# Patient Record
Sex: Female | Born: 1970
Health system: Southern US, Community
[De-identification: ages and names within clinical notes are randomized; demographics above are authoritative.]

## PROBLEM LIST (undated history)

## (undated) DIAGNOSIS — R519 Headache, unspecified: Secondary | ICD-10-CM

## (undated) DIAGNOSIS — Z8719 Personal history of other diseases of the digestive system: Secondary | ICD-10-CM

## (undated) DIAGNOSIS — H539 Unspecified visual disturbance: Secondary | ICD-10-CM

## (undated) DIAGNOSIS — F419 Anxiety disorder, unspecified: Secondary | ICD-10-CM

## (undated) DIAGNOSIS — M1990 Unspecified osteoarthritis, unspecified site: Secondary | ICD-10-CM

## (undated) DIAGNOSIS — Z973 Presence of spectacles and contact lenses: Secondary | ICD-10-CM

## (undated) DIAGNOSIS — F329 Major depressive disorder, single episode, unspecified: Secondary | ICD-10-CM

## (undated) DIAGNOSIS — J45909 Unspecified asthma, uncomplicated: Secondary | ICD-10-CM

## (undated) DIAGNOSIS — F32A Depression, unspecified: Secondary | ICD-10-CM

## (undated) DIAGNOSIS — K5900 Constipation, unspecified: Secondary | ICD-10-CM

## (undated) DIAGNOSIS — K219 Gastro-esophageal reflux disease without esophagitis: Secondary | ICD-10-CM

## (undated) DIAGNOSIS — T7840XA Allergy, unspecified, initial encounter: Secondary | ICD-10-CM

## (undated) DIAGNOSIS — R0600 Dyspnea, unspecified: Secondary | ICD-10-CM

## (undated) DIAGNOSIS — G35 Multiple sclerosis: Secondary | ICD-10-CM

## (undated) HISTORY — DX: Unspecified visual disturbance: H53.9

## (undated) HISTORY — PX: WISDOM TOOTH EXTRACTION: SHX21

## (undated) HISTORY — DX: Multiple sclerosis: G35

## (undated) HISTORY — PX: UPPER GI ENDOSCOPY: SHX6162

## (undated) HISTORY — PX: COLONOSCOPY: SHX174

## (undated) HISTORY — PX: OTHER SURGICAL HISTORY: SHX169

## (undated) HISTORY — PX: EXCISION MORTON'S NEUROMA: SHX5013

---
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
## 2005-05-28 DIAGNOSIS — J189 Pneumonia, unspecified organism: Secondary | ICD-10-CM

## 2005-05-28 HISTORY — DX: Pneumonia, unspecified organism: J18.9

## 2016-07-04 ENCOUNTER — Encounter: Payer: Self-pay | Admitting: Neurology

## 2017-02-06 DIAGNOSIS — Z79899 Other long term (current) drug therapy: Secondary | ICD-10-CM | POA: Insufficient documentation

## 2017-03-04 DIAGNOSIS — R627 Adult failure to thrive: Secondary | ICD-10-CM | POA: Insufficient documentation

## 2017-03-04 DIAGNOSIS — G35 Multiple sclerosis: Secondary | ICD-10-CM | POA: Insufficient documentation

## 2017-04-30 DIAGNOSIS — R531 Weakness: Secondary | ICD-10-CM | POA: Diagnosis not present

## 2017-04-30 DIAGNOSIS — G35 Multiple sclerosis: Secondary | ICD-10-CM | POA: Diagnosis not present

## 2017-05-01 DIAGNOSIS — R531 Weakness: Secondary | ICD-10-CM | POA: Diagnosis not present

## 2017-05-01 DIAGNOSIS — G35 Multiple sclerosis: Secondary | ICD-10-CM | POA: Diagnosis not present

## 2017-05-02 DIAGNOSIS — G35 Multiple sclerosis: Secondary | ICD-10-CM | POA: Diagnosis not present

## 2017-05-02 DIAGNOSIS — R531 Weakness: Secondary | ICD-10-CM | POA: Diagnosis not present

## 2017-05-03 DIAGNOSIS — G35 Multiple sclerosis: Secondary | ICD-10-CM | POA: Diagnosis not present

## 2017-05-03 DIAGNOSIS — R531 Weakness: Secondary | ICD-10-CM | POA: Diagnosis not present

## 2017-05-04 DIAGNOSIS — G35 Multiple sclerosis: Secondary | ICD-10-CM | POA: Diagnosis not present

## 2017-05-04 DIAGNOSIS — R252 Cramp and spasm: Secondary | ICD-10-CM | POA: Diagnosis not present

## 2017-05-04 DIAGNOSIS — K219 Gastro-esophageal reflux disease without esophagitis: Secondary | ICD-10-CM | POA: Diagnosis not present

## 2017-05-04 DIAGNOSIS — M6281 Muscle weakness (generalized): Secondary | ICD-10-CM | POA: Diagnosis not present

## 2017-05-07 DIAGNOSIS — M79602 Pain in left arm: Secondary | ICD-10-CM | POA: Diagnosis not present

## 2017-05-07 DIAGNOSIS — G35 Multiple sclerosis: Secondary | ICD-10-CM | POA: Diagnosis not present

## 2017-05-07 DIAGNOSIS — M79605 Pain in left leg: Secondary | ICD-10-CM | POA: Diagnosis not present

## 2017-05-07 DIAGNOSIS — M62838 Other muscle spasm: Secondary | ICD-10-CM | POA: Diagnosis not present

## 2017-05-07 DIAGNOSIS — M545 Low back pain: Secondary | ICD-10-CM | POA: Diagnosis not present

## 2017-05-07 DIAGNOSIS — R269 Unspecified abnormalities of gait and mobility: Secondary | ICD-10-CM | POA: Diagnosis not present

## 2017-05-07 DIAGNOSIS — Z79899 Other long term (current) drug therapy: Secondary | ICD-10-CM | POA: Diagnosis not present

## 2017-05-08 DIAGNOSIS — R531 Weakness: Secondary | ICD-10-CM | POA: Diagnosis not present

## 2017-05-08 DIAGNOSIS — G35 Multiple sclerosis: Secondary | ICD-10-CM | POA: Diagnosis not present

## 2017-05-10 DIAGNOSIS — G35 Multiple sclerosis: Secondary | ICD-10-CM | POA: Diagnosis not present

## 2017-05-10 DIAGNOSIS — R531 Weakness: Secondary | ICD-10-CM | POA: Diagnosis not present

## 2017-05-23 DIAGNOSIS — M5090 Cervical disc disorder, unspecified, unspecified cervical region: Secondary | ICD-10-CM | POA: Diagnosis not present

## 2017-05-23 DIAGNOSIS — M4316 Spondylolisthesis, lumbar region: Secondary | ICD-10-CM | POA: Diagnosis not present

## 2017-05-23 DIAGNOSIS — M62838 Other muscle spasm: Secondary | ICD-10-CM | POA: Diagnosis not present

## 2017-05-23 DIAGNOSIS — G35 Multiple sclerosis: Secondary | ICD-10-CM | POA: Diagnosis not present

## 2017-05-23 DIAGNOSIS — Z720 Tobacco use: Secondary | ICD-10-CM | POA: Diagnosis not present

## 2017-06-04 DIAGNOSIS — N6323 Unspecified lump in the left breast, lower outer quadrant: Secondary | ICD-10-CM | POA: Diagnosis not present

## 2017-06-04 DIAGNOSIS — N6310 Unspecified lump in the right breast, unspecified quadrant: Secondary | ICD-10-CM | POA: Diagnosis not present

## 2017-06-04 DIAGNOSIS — N6324 Unspecified lump in the left breast, lower inner quadrant: Secondary | ICD-10-CM | POA: Diagnosis not present

## 2017-06-04 DIAGNOSIS — R928 Other abnormal and inconclusive findings on diagnostic imaging of breast: Secondary | ICD-10-CM | POA: Diagnosis not present

## 2017-06-05 DIAGNOSIS — G894 Chronic pain syndrome: Secondary | ICD-10-CM | POA: Diagnosis not present

## 2017-06-05 DIAGNOSIS — F329 Major depressive disorder, single episode, unspecified: Secondary | ICD-10-CM | POA: Diagnosis not present

## 2017-06-05 DIAGNOSIS — G35 Multiple sclerosis: Secondary | ICD-10-CM | POA: Diagnosis not present

## 2017-06-05 DIAGNOSIS — M6281 Muscle weakness (generalized): Secondary | ICD-10-CM | POA: Diagnosis not present

## 2017-06-11 DIAGNOSIS — N179 Acute kidney failure, unspecified: Secondary | ICD-10-CM | POA: Diagnosis not present

## 2017-06-24 DIAGNOSIS — N839 Noninflammatory disorder of ovary, fallopian tube and broad ligament, unspecified: Secondary | ICD-10-CM | POA: Diagnosis not present

## 2017-06-24 DIAGNOSIS — N83202 Unspecified ovarian cyst, left side: Secondary | ICD-10-CM | POA: Diagnosis not present

## 2017-06-24 DIAGNOSIS — N83201 Unspecified ovarian cyst, right side: Secondary | ICD-10-CM | POA: Diagnosis not present

## 2017-06-25 DIAGNOSIS — N319 Neuromuscular dysfunction of bladder, unspecified: Secondary | ICD-10-CM | POA: Diagnosis not present

## 2017-06-25 DIAGNOSIS — N83202 Unspecified ovarian cyst, left side: Secondary | ICD-10-CM | POA: Diagnosis not present

## 2017-06-25 DIAGNOSIS — N83201 Unspecified ovarian cyst, right side: Secondary | ICD-10-CM | POA: Diagnosis not present

## 2017-06-25 DIAGNOSIS — N3941 Urge incontinence: Secondary | ICD-10-CM | POA: Diagnosis not present

## 2017-07-08 DIAGNOSIS — H5123 Internuclear ophthalmoplegia, bilateral: Secondary | ICD-10-CM | POA: Diagnosis not present

## 2017-07-08 DIAGNOSIS — G35 Multiple sclerosis: Secondary | ICD-10-CM | POA: Diagnosis not present

## 2017-07-09 DIAGNOSIS — M62838 Other muscle spasm: Secondary | ICD-10-CM | POA: Diagnosis not present

## 2017-07-09 DIAGNOSIS — G35 Multiple sclerosis: Secondary | ICD-10-CM | POA: Diagnosis not present

## 2017-07-11 DIAGNOSIS — G35 Multiple sclerosis: Secondary | ICD-10-CM | POA: Diagnosis not present

## 2017-07-18 DIAGNOSIS — M6281 Muscle weakness (generalized): Secondary | ICD-10-CM | POA: Diagnosis not present

## 2017-07-18 DIAGNOSIS — R109 Unspecified abdominal pain: Secondary | ICD-10-CM | POA: Diagnosis not present

## 2017-07-18 DIAGNOSIS — Z79899 Other long term (current) drug therapy: Secondary | ICD-10-CM | POA: Diagnosis not present

## 2017-07-18 DIAGNOSIS — G35 Multiple sclerosis: Secondary | ICD-10-CM | POA: Diagnosis not present

## 2017-07-24 DIAGNOSIS — G35 Multiple sclerosis: Secondary | ICD-10-CM | POA: Diagnosis not present

## 2017-08-15 ENCOUNTER — Encounter: Payer: Self-pay | Admitting: Neurology

## 2017-08-15 ENCOUNTER — Other Ambulatory Visit: Payer: Self-pay

## 2017-08-15 ENCOUNTER — Ambulatory Visit (INDEPENDENT_AMBULATORY_CARE_PROVIDER_SITE_OTHER): Payer: BLUE CROSS/BLUE SHIELD | Admitting: Neurology

## 2017-08-15 VITALS — BP 132/84 | HR 84 | Resp 18 | Ht 69.0 in | Wt 209.5 lb

## 2017-08-15 DIAGNOSIS — R208 Other disturbances of skin sensation: Secondary | ICD-10-CM | POA: Diagnosis not present

## 2017-08-15 DIAGNOSIS — H532 Diplopia: Secondary | ICD-10-CM | POA: Diagnosis not present

## 2017-08-15 DIAGNOSIS — R269 Unspecified abnormalities of gait and mobility: Secondary | ICD-10-CM | POA: Diagnosis not present

## 2017-08-15 DIAGNOSIS — G9341 Metabolic encephalopathy: Secondary | ICD-10-CM | POA: Insufficient documentation

## 2017-08-15 DIAGNOSIS — F418 Other specified anxiety disorders: Secondary | ICD-10-CM

## 2017-08-15 DIAGNOSIS — M62838 Other muscle spasm: Secondary | ICD-10-CM | POA: Diagnosis not present

## 2017-08-15 DIAGNOSIS — G35 Multiple sclerosis: Secondary | ICD-10-CM | POA: Diagnosis not present

## 2017-08-15 DIAGNOSIS — Z79899 Other long term (current) drug therapy: Secondary | ICD-10-CM | POA: Diagnosis not present

## 2017-08-15 MED ORDER — LAMOTRIGINE 100 MG PO TABS: 100.0000 mg | ORAL_TABLET | Freq: Two times a day (BID) | ORAL | 5 refills | Status: DC

## 2017-08-15 NOTE — Progress Notes (Signed)
[facility]  PATIENT: Sharon Odom DOB: 23-Jun-1970  REFERRING DOCTOR OR PCP:  Sharon Odom SOURCE: Patient, notes imaging and lab reports, MRI images on CD from neurology,  _________________________________   HISTORICAL during 1 of her exacerbations a couple years ago  CHIEF COMPLAINT:  Chief Complaint  Patient presents with  . Multiple Sclerosis    Here with husband Sharon Odom to transfer care of MS to [facility].  Dx. October 2013.  Presenting sx. were gait disturbance, right sided numbness, noted her signature was sloppy.  MRI and LP done.  Initially on Copaxone--stopped 2-3 mos. later due to relapse.  Next, she was on Tecfidera for less than 6 mos (relapse).  Next, was on Tysabri from 2014 to 2016. She stopped due to positive JCV ab. Has had 2 years of [place].  Sts. in July 2016 she started having more stiffness left leg.  Walks in a cane or walker, w/c   . Gait Disturbance    for longer distances.  She has cd of last MRI with her/fim  Sharon Odom given at [facility] in [place], phone# [phone], fax# [phone].  Last neurologist was Dr. Canary Odom, in [place], phone# [phone], fax# [phone]/fim    HISTORY OF PRESENT ILLNESS: At the pleasure seeing the patient, Sharon Odom, at the [facility] at [facility] neurologic  She was diagnosed in October 2013 due to difficulty with her handwriting and right hand coordination.   A few weeks later she had gait ataxia and numbness and was referred to MS.   She had an MRI of the brain (was in [place] at the time) and then had a LP confirming the diagnosis.  Initially, she was placed on Copaxone but had severe frequent exacerbations and then went to Tecfidera due to breakthrough exacerbations.  She was referred to [facility] for Tysabri (Feb 2014 to November 2016).  She then moved to [facility] and saw Dr. Ilene Odom [facility] [phone]).   She was always JCV Ab  titer positive.    She did well on it but stopped 03/2015 due to safety risk.  She had more spasticity early 2017 that improved with 5 days IV Solumedrol and then started [facility] April 2017.   The infusion went well.    She started having more spasms in her legs later in 2017 and had Botox injections into her feet (some benefit but only for 3-4 weeks).   She had a second course of Botox November 2017 without much benefit.     She had numbness and tingling in her left arm November 2017 and was given an ESI.   Medical MJ did not help.   She had her second course of Lemtrada in April 2018 but the steroid did not help her spasticity as it did the first year.    She saw Dr. Gaynell Face at [facility] a couple times in 2018 and also saw Dr. Aquilla Hacker at [facility].  Currently,   She uses a walker (rarely cane) around her home and is wheeled outside the house.   Earlier this year, she had worsening spasticity with toes very spastic and legs locking up.    The severe leg spasms have improved but she still has spasms in her feet that are severe and painful.   Spasticity is worse on the left (Odom, leg and arm) and only in her Odom on the right.    She takes 20 mg po qid.   Tizanidine did not help much.  She has some painful dysesthesia but gabapentin had not helped.   She continues to have a lot of pain, worse on her left.     Her left arm is also weak and she can't do many self care tasks (dressing, etc).    Due to the pain, she was drinking alcohol (2 - 3 drinks).   Clonazepam was tried but doses were low.   She was on Ampyra in the past 2014-2017 but felt it wasn't working and stopped.   She was on both Ampyra and Wellbutrin for at least one year and there were no seizures.     She has urinary frequency and urgency, helped by Myrbetriq.   Sometimes due to her gait, she cannot get to the bathroom in time.    She had diplopia during 1 of her exacerbations a few years ago.  She continues to have some double  vision though she is doing better now.    She never had optic neuritis.    Wellbutrin helped her mood better initially and she is very frustrated and irritable.   She was diagnosed with PBA in May 2018 and started on Nuedexta.   She did better mood-wise but spasticity was worse and she stopped.       She feels cognitive skills are mildly worse with mild short term memory issues and decreased focus and attention.     I have reviewed the MRI of the brain and cervical spine dated 03/19/2017.  The MRI of the brain shows multiple T2/FLAIR hyperintense foci in the cerebellum, pons, left greater than right thalamus and in the periventricular, juxtacortical and deep white matter of both hemispheres.    The MRI of the cervical spine showed a subtle focus Adjacent to C4-C5.  She has a disc protrusion at [facility].  REVIEW OF SYSTEMS: Constitutional: No fevers, chills, sweats, or change in appetite.  She has fatigue  Eyes: As above Ear, nose and throat: No hearing loss, ear pain, nasal congestion, sore throat Cardiovascular: No chest pain, palpitations Respiratory: No shortness of breath at rest or with exertion.   No wheezes GastrointestinaI: No nausea, vomiting, diarrhea, abdominal pain, fecal incontinence Genitourinary:As above  musculoskeletal: No neck pain, back pain Integumentary: No rash, pruritus, skin lesions Neurological: as above Psychiatric: As above Endocrine: No palpitations, diaphoresis, change in appetite, change in weigh or increased thirst Hematologic/Lymphatic: No anemia, purpura, petechiae. Allergic/Immunologic: No itchy/runny eyes, nasal congestion, recent allergic reactions, rashes  ALLERGIES: Allergies  Allergen Reactions  . Morphine Other (See Comments)    HOME MEDICATIONS:  Current Outpatient Medications:  .  baclofen (LIORESAL) 20 MG tablet, Take by mouth., Disp: , Rfl:  .  mirabegron ER (MYRBETRIQ) 50 MG TB24 tablet, Take by mouth., Disp: , Rfl:  .  ondansetron  (ZOFRAN) 8 MG tablet, as needed With infusions, Disp: , Rfl:  .  acetaminophen (TYLENOL) 650 MG CR tablet, Take by mouth., Disp: , Rfl:  .  buPROPion (WELLBUTRIN XL) 300 MG 24 hr tablet, Take by mouth., Disp: , Rfl:  .  CASCARA SAGRADA PO, Take by mouth., Disp: , Rfl:  .  Cholecalciferol (VITAMIN D3) 2000 units capsule, Take by mouth., Disp: , Rfl:  .  Green Tea, Camillia sinensis, 1000 MG TABS, Green Tea Complex  500 mg daily, Disp: , Rfl:  .  ibuprofen (ADVIL,MOTRIN) 200 MG tablet, daily., Disp: , Rfl:  .  lamoTRIgine (LAMICTAL) 100 MG tablet, Take 1 tablet (100 mg total) by mouth 2 (two) times daily., Disp:  60 tablet, Rfl: 5 .  Mag Aspart-Potassium Aspart (RA POTASSIUM/MAGNESIUM) 250-250 MG CAPS, daily, Disp: , Rfl:  .  Omega-3 Fatty Acids (FISH OIL PO), Take by mouth., Disp: , Rfl:  .  potassium chloride (MICRO-K) 10 MEQ CR capsule, Take by mouth., Disp: , Rfl:  .  ranitidine (ZANTAC) 300 MG capsule, Take by mouth., Disp: , Rfl:  .  TURMERIC PO, turmeric  1000 mg daily, Disp: , Rfl:  .  vitamin B-12 (CYANOCOBALAMIN) 1000 MCG tablet, Take by mouth., Disp: , Rfl:   PAST MEDICAL HISTORY: Past Medical History:  Diagnosis Date  . Multiple sclerosis (HCC)   . Vision abnormalities     PAST SURGICAL HISTORY:   FAMILY HISTORY: Family History  Problem Relation Age of Onset  . Diabetes Mother   . Healthy Brother     SOCIAL HISTORY:  Social History   Socioeconomic History  . Marital status: Married    Spouse name: Not on file  . Number of children: Not on file  . Years of education: Not on file  . Highest education level: Not on file  Occupational History  . Not on file  Social Needs  . Financial resource strain: Not on file  . Food insecurity:    Worry: Not on file    Inability: Not on file  . Transportation needs:    Medical: Not on file    Non-medical: Not on file  Tobacco Use  . Smoking status: Current Every Day Smoker  . Smokeless tobacco: Current User  Substance  and Sexual Activity  . Alcohol use: Yes    Comment: 2-3 times per wk  . Drug use: Never  . Sexual activity: Not on file  Lifestyle  . Physical activity:    Days per week: Not on file    Minutes per session: Not on file  . Stress: Not on file  Relationships  . Social connections:    Talks on phone: Not on file    Gets together: Not on file    Attends religious service: Not on file    Active member of club or organization: Not on file    Attends meetings of clubs or organizations: Not on file    Relationship status: Not on file  . Intimate partner violence:    Fear of current or ex partner: Not on file    Emotionally abused: Not on file    Physically abused: Not on file    Forced sexual activity: Not on file  Other Topics Concern  . Not on file  Social History Narrative  . Not on file     PHYSICAL EXAM  Vitals:   08/15/17 1346  BP: 132/84  Pulse: 84  Resp: 18  Weight: 209 lb 8 oz (95 kg)  Height: 5\' 9"  (1.753 m)    Body mass index is 30.94 kg/m.   General: The patient is well-developed and well-nourished and in no acute distress  Eyes:  Funduscopic exam shows normal optic discs and retinal vessels.  Neck: The neck is supple, no carotid bruits are noted.  The neck is nontender.  Cardiovascular: The heart has a regular rate and rhythm with a normal S1 and S2. There were no murmurs, gallops or rubs. Lungs are clear to auscultation.  Skin: Extremities are without significant edema.  Musculoskeletal:  Back is nontender  Neurologic Exam  Mental status: The patient is alert and oriented x 3 at the time of the examination. The patient has apparent normal recent  and remote memory, with an apparently normal attention span and concentration ability.   Speech is normal.  Cranial nerves: Extraocular movements are full. Pupils are equal, round, and reactive to light and accomodation.  Visual fields are full.  Facial symmetry is present. There is good facial sensation to  soft touch bilaterally.Facial strength is normal.  Trapezius and sternocleidomastoid strength is normal. No dysarthria is noted.  The tongue is midline, and the patient has symmetric elevation of the soft palate. No obvious hearing deficits are noted.  Motor:  Muscle bulk is normal.   Muscle tone is increased in the left more than right legs and the  left arm.  Strength is 4/5 in the left arm in 3/5 in the left leg and 4-/5 the right leg.  Sensory: Normal sensation to touch and vibration in the arms.  She has reduced sensation to vibration in the legs, worse on the left.  Coordination: Cerebellar testing reveals good right finger-nose-finger and reduced left finger-nose-finger.  The right heel to shin is poorand she cannot doing this on the left  Gait and station: She needs support to stand.  Her gait is spastic.  She cannot do a tandem walk.  Romberg is positive.  Reflexes: Deep tendon reflexes are increased in the legs with spread at the knees and clonus at the ankles, nonsustained bilaterally but more on the left.   plantar responses are flexor.  25 Odom timed walk is 26.0 seconds    DIAGNOSTIC DATA (LABS, IMAGING, TESTING) - I reviewed patient records, labs, notes, testing and imaging myself where available.     ASSESSMENT AND PLAN  Multiple sclerosis (HCC)  Gait disturbance  Muscle spasticity  Depression with anxiety  Diplopia  High risk medication use  Dysesthesia   In summary, Mrs. Fifer is a 47 year old woman with a relapsing form of secondary progressive MS who was treated with [place] with the first infusion week April 2017 and the second infusion April 2018.   Her last MRI did show 1 lesion in 2018 that was not present in 2017.  She would like to have a round of Lemtrada.  I discussed that we need tothird check her REMS data before making any decision.  In the short-term, I will have her get a couple days of IV Solu-Medrol to see if symptoms improve.  When she  returns to see me in 2 months we will make a decision about the [place].  She has spasticity worse in the feet.  She received some benefit from Botox and we could consider injections into the Odom and ankle.  I would try to get records from Dr. Drinda Odom [phone] to see how many units and what muscles were injected in the past.   Before we do that, however, I would like her to increase her baclofen to 100 mg and then up to 120 mg if tolerated.  We also spent some time discussing the baclofen pump but there is some concern that it could lead to more weakness and she is not interested at this time.    She has dysesthetic sensations that did not respond to gabapentin.  I will add lamotrigine and titrate up to 100 mg p.o. twice daily and increase this further if there is some benefit and it is well-tolerated.  She will return to see me in 2 months or sooner if there are new or worsening neurologic symptoms.  Thank you for asking me to see Mrs. Herold Harms.  Please let me know  if I can be of further assistance with her or other patients in the future.  100-minute face-to-face evaluation with greater than one half the time counseling and coordinating care about her many symptoms related to multiple sclerosis  Sharon A. Epimenio Foot, MD, [facility] 08/15/2017, 9:51 PM Certified in Neurology, Clinical Neurophysiology, Sleep Medicine, Pain Medicine and Neuroimaging  [facility] [address], [address] [place], [place] [address] [phone]  botox in the past was  Dr. Ilene Odom [facility] [phone])

## 2017-08-15 NOTE — Patient Instructions (Signed)
The pharmacy has the prescription for lamotrigine 100 mg tablets. For 5 days, just take one half pill a day. For the next 5 days, take one half pill twice a day. For the next 5 days, take one half pill 3 times a day Then start taking one pill twice a day from this point on.    In the future, we may increase the dose further.  If you get a rash, need to stop the medication and not take it again. 

## 2017-08-19 DIAGNOSIS — G35 Multiple sclerosis: Secondary | ICD-10-CM | POA: Diagnosis not present

## 2017-08-20 DIAGNOSIS — G35 Multiple sclerosis: Secondary | ICD-10-CM | POA: Diagnosis not present

## 2017-08-21 DIAGNOSIS — G35 Multiple sclerosis: Secondary | ICD-10-CM | POA: Diagnosis not present

## 2017-08-26 ENCOUNTER — Encounter: Payer: Self-pay | Admitting: Neurology

## 2017-09-09 DIAGNOSIS — L816 Other disorders of diminished melanin formation: Secondary | ICD-10-CM | POA: Diagnosis not present

## 2017-09-09 DIAGNOSIS — L814 Other melanin hyperpigmentation: Secondary | ICD-10-CM | POA: Diagnosis not present

## 2017-09-09 DIAGNOSIS — L821 Other seborrheic keratosis: Secondary | ICD-10-CM | POA: Diagnosis not present

## 2017-09-09 DIAGNOSIS — D1801 Hemangioma of skin and subcutaneous tissue: Secondary | ICD-10-CM | POA: Diagnosis not present

## 2017-09-12 DIAGNOSIS — F341 Dysthymic disorder: Secondary | ICD-10-CM | POA: Diagnosis not present

## 2017-09-12 DIAGNOSIS — G35 Multiple sclerosis: Secondary | ICD-10-CM | POA: Diagnosis not present

## 2017-09-12 DIAGNOSIS — M5412 Radiculopathy, cervical region: Secondary | ICD-10-CM | POA: Diagnosis not present

## 2017-09-12 DIAGNOSIS — M6281 Muscle weakness (generalized): Secondary | ICD-10-CM | POA: Diagnosis not present

## 2017-09-20 DIAGNOSIS — M545 Low back pain: Secondary | ICD-10-CM | POA: Diagnosis not present

## 2017-09-20 DIAGNOSIS — M542 Cervicalgia: Secondary | ICD-10-CM | POA: Diagnosis not present

## 2017-09-20 DIAGNOSIS — I10 Essential (primary) hypertension: Secondary | ICD-10-CM | POA: Diagnosis not present

## 2017-09-20 DIAGNOSIS — Z6831 Body mass index (BMI) 31.0-31.9, adult: Secondary | ICD-10-CM | POA: Diagnosis not present

## 2017-09-20 DIAGNOSIS — G35 Multiple sclerosis: Secondary | ICD-10-CM | POA: Diagnosis not present

## 2017-09-26 ENCOUNTER — Encounter: Payer: Self-pay | Admitting: Neurology

## 2017-09-26 NOTE — Telephone Encounter (Signed)
[name], I changed apt to injection, patient called me and I made her aware!

## 2017-09-30 ENCOUNTER — Encounter: Payer: Self-pay | Admitting: Neurology

## 2017-10-05 DIAGNOSIS — R252 Cramp and spasm: Secondary | ICD-10-CM | POA: Diagnosis not present

## 2017-10-05 DIAGNOSIS — G35 Multiple sclerosis: Secondary | ICD-10-CM | POA: Diagnosis not present

## 2017-10-05 DIAGNOSIS — N390 Urinary tract infection, site not specified: Secondary | ICD-10-CM | POA: Diagnosis not present

## 2017-10-05 DIAGNOSIS — M6281 Muscle weakness (generalized): Secondary | ICD-10-CM | POA: Diagnosis not present

## 2017-10-15 DIAGNOSIS — M50223 Other cervical disc displacement at C6-C7 level: Secondary | ICD-10-CM | POA: Diagnosis not present

## 2017-10-15 DIAGNOSIS — N921 Excessive and frequent menstruation with irregular cycle: Secondary | ICD-10-CM | POA: Diagnosis not present

## 2017-10-15 DIAGNOSIS — N950 Postmenopausal bleeding: Secondary | ICD-10-CM | POA: Diagnosis not present

## 2017-10-15 DIAGNOSIS — G35 Multiple sclerosis: Secondary | ICD-10-CM | POA: Diagnosis not present

## 2017-10-15 DIAGNOSIS — Z1151 Encounter for screening for human papillomavirus (HPV): Secondary | ICD-10-CM | POA: Diagnosis not present

## 2017-10-15 DIAGNOSIS — Z124 Encounter for screening for malignant neoplasm of cervix: Secondary | ICD-10-CM | POA: Diagnosis not present

## 2017-10-16 ENCOUNTER — Telehealth: Payer: Self-pay | Admitting: Neurology

## 2017-10-16 ENCOUNTER — Encounter: Payer: Self-pay | Admitting: *Deleted

## 2017-10-16 ENCOUNTER — Other Ambulatory Visit: Payer: Self-pay

## 2017-10-16 ENCOUNTER — Encounter: Payer: Self-pay | Admitting: Neurology

## 2017-10-16 ENCOUNTER — Ambulatory Visit (INDEPENDENT_AMBULATORY_CARE_PROVIDER_SITE_OTHER): Payer: BLUE CROSS/BLUE SHIELD | Admitting: Neurology

## 2017-10-16 DIAGNOSIS — R269 Unspecified abnormalities of gait and mobility: Secondary | ICD-10-CM | POA: Diagnosis not present

## 2017-10-16 DIAGNOSIS — G35 Multiple sclerosis: Secondary | ICD-10-CM

## 2017-10-16 DIAGNOSIS — F418 Other specified anxiety disorders: Secondary | ICD-10-CM

## 2017-10-16 DIAGNOSIS — G8220 Paraplegia, unspecified: Secondary | ICD-10-CM | POA: Diagnosis not present

## 2017-10-16 DIAGNOSIS — Z79899 Other long term (current) drug therapy: Secondary | ICD-10-CM | POA: Diagnosis not present

## 2017-10-16 DIAGNOSIS — N921 Excessive and frequent menstruation with irregular cycle: Secondary | ICD-10-CM | POA: Diagnosis not present

## 2017-10-16 DIAGNOSIS — M62838 Other muscle spasm: Secondary | ICD-10-CM | POA: Diagnosis not present

## 2017-10-16 MED ORDER — NITROFURANTOIN MACROCRYSTAL 100 MG PO CAPS: 100.0000 mg | ORAL_CAPSULE | Freq: Two times a day (BID) | ORAL | 0 refills | Status: DC

## 2017-10-16 MED ORDER — DALFAMPRIDINE ER 10 MG PO TB12: 10.0000 mg | ORAL_TABLET | Freq: Two times a day (BID) | ORAL | 11 refills | Status: DC

## 2017-10-16 MED ORDER — BACLOFEN 20 MG PO TABS: 20.0000 mg | ORAL_TABLET | Freq: Four times a day (QID) | ORAL | 3 refills | Status: DC

## 2017-10-16 MED ORDER — NITROFURANTOIN MONOHYD MACRO 100 MG PO CAPS: 100.0000 mg | ORAL_CAPSULE | Freq: Two times a day (BID) | ORAL | 0 refills | Status: DC

## 2017-10-16 NOTE — Telephone Encounter (Signed)
Spoke with [employer].  She requested a letter stating it is ok for her to have cosmetic Botox and fillers.  Per [facility] this is ok.  Letter completed and mailed to pt's home address/fim

## 2017-10-16 NOTE — Progress Notes (Signed)
[facility]  PATIENT: Sharon Odom DOB: 02-14-1971  REFERRING DOCTOR OR PCP:  Claretta Fraise SOURCE: Patient, notes imaging and lab reports, MRI images on CD from neurology,  _________________________________   HISTORICAL during 1 of her exacerbations a couple years ago  CHIEF COMPLAINT:  Chief Complaint  Patient presents with  . Multiple Sclerosis    2nd yr. Lemtrada infusions were in April 2018.  Here today for Botox for spasticity in legs/feet.  Botox 100u, 2 vials, [facility]  Lot# [id number].  Exp. 01/2020.  NDC 0023-1145-01/fim    HISTORY OF PRESENT ILLNESS: Sharon Odom is a 47 year old woman with multiple sclerosis, left greater than right spasticity and poor gait.  Update 10/16/2017: After the last visit, the baclofen dose was increased but she had trouble tolerating higher doses and did not think that her spasticity improved any.  She continues to report a lot of spasticity in both legs, left greater than right and in the left arm.  She denies spasticity in the right arm.  There continues to phasic spasms superimposed on the tonic spasms predominantly involving both feet and the left leg.  Gait is poor.  Around the home she will use a walker and can get from one room to another.   At the last visit I also started lamotrigine for her dysesthesias but it was poorly tolerated and she stopped.  Previously she had been unable to tolerate oxcarbazepine.  She has not yet restarted the Ampyra.  In the past, she had received Botox injections into muscles of the left lower leg and both feet.  This helped quite a bit with the first series but less so with the second series.  Since she has moved up to this area, she has not had any more Botox injections.  She had her first course of Lemtrada April 2017 and the second course of April 2018.  She has noted some further progression over the last year and we have discussed an additional third course.  She had  shingles earlier this year.  Pain has resolved.  From 08/15/2017:  She was diagnosed in October 2013 due to difficulty with her handwriting and right hand coordination.   A few weeks later she had gait ataxia and numbness and was referred to MS.   She had an MRI of the brain (was in [place] at the time) and then had a LP confirming the diagnosis.  Initially, she was placed on Copaxone but had severe frequent exacerbations and then went to Tecfidera due to breakthrough exacerbations.  She was referred to [facility] for Tysabri (Feb 2014 to November 2016).  She then moved to [facility] and saw Dr. Ilene Qua [facility] [phone]).   She was always JCV Ab titer positive.    She did well on it but stopped 03/2015 due to safety risk.  She had more spasticity early 2017 that improved with 5 days IV Solumedrol and then started [facility] April 2017.   The infusion went well.    She started having more spasms in her legs later in 2017 and had Botox injections into her feet (some benefit but only for 3-4 weeks).   She had a second course of Botox November 2017 without much benefit.     She had numbness and tingling in her left arm November 2017 and was given an ESI.   Medical MJ did not help.   She had her second course of Lemtrada in April 2018 but the steroid did not help her  spasticity as it did the first year.    She saw Dr. Gaynell Face at [facility] a couple times in 2018 and also saw Dr. Aquilla Hacker at [facility].  Currently,   She uses a walker (rarely cane) around her home and is wheeled outside the house.   Earlier this year, she had worsening spasticity with toes very spastic and legs locking up.    The severe leg spasms have improved but she still has spasms in her feet that are severe and painful.   Spasticity is worse on the left (foot, leg and arm) and only in her foot on the right.    She takes 20 mg po qid.   Tizanidine did not help much.   She has some painful dysesthesia but gabapentin had not helped.    She continues to have a lot of pain, worse on her left.     Her left arm is also weak and she can't do many self care tasks (dressing, etc).    Due to the pain, she was drinking alcohol (2 - 3 drinks).   Clonazepam was tried but doses were low.   She was on Ampyra in the past 2014-2017 but felt it wasn't working and stopped.   She was on both Ampyra and Wellbutrin for at least one year and there were no seizures.     She has urinary frequency and urgency, helped by Myrbetriq.   Sometimes due to her gait, she cannot get to the bathroom in time.    She had diplopia during 1 of her exacerbations a few years ago.  She continues to have some double vision though she is doing better now.    She never had optic neuritis.    Wellbutrin helped her mood better initially and she is very frustrated and irritable.   She was diagnosed with PBA in May 2018 and started on Nuedexta.   She did better mood-wise but spasticity was worse and she stopped.       She feels cognitive skills are mildly worse with mild short term memory issues and decreased focus and attention.     I have reviewed the MRI of the brain and cervical spine dated 03/19/2017.  The MRI of the brain shows multiple T2/FLAIR hyperintense foci in the cerebellum, pons, left greater than right thalamus and in the periventricular, juxtacortical and deep white matter of both hemispheres.    The MRI of the cervical spine showed a subtle focus Adjacent to C4-C5.  She has a disc protrusion at [facility].  REVIEW OF SYSTEMS: Constitutional: No fevers, chills, sweats, or change in appetite.  She has fatigue  Eyes: As above Ear, nose and throat: No hearing loss, ear pain, nasal congestion, sore throat Cardiovascular: No chest pain, palpitations Respiratory: No shortness of breath at rest or with exertion.   No wheezes GastrointestinaI: No nausea, vomiting, diarrhea, abdominal pain, fecal incontinence Genitourinary:As above  musculoskeletal: No neck pain, back  pain Integumentary: No rash, pruritus, skin lesions Neurological: as above Psychiatric: As above Endocrine: No palpitations, diaphoresis, change in appetite, change in weigh or increased thirst Hematologic/Lymphatic: No anemia, purpura, petechiae. Allergic/Immunologic: No itchy/runny eyes, nasal congestion, recent allergic reactions, rashes  ALLERGIES: Allergies  Allergen Reactions  . Morphine Other (See Comments)    HOME MEDICATIONS:  Current Outpatient Medications:  .  acetaminophen (TYLENOL) 650 MG CR tablet, Take by mouth., Disp: , Rfl:  .  baclofen (LIORESAL) 20 MG tablet, Take 1 tablet (20 mg total) by mouth 4 (  four) times daily., Disp: 360 each, Rfl: 3 .  buPROPion (WELLBUTRIN XL) 300 MG 24 hr tablet, Take by mouth., Disp: , Rfl:  .  CASCARA SAGRADA PO, Take by mouth., Disp: , Rfl:  .  Cholecalciferol (VITAMIN D3) 2000 units capsule, Take by mouth., Disp: , Rfl:  .  Green Tea, Camillia sinensis, 1000 MG TABS, Green Tea Complex  500 mg daily, Disp: , Rfl:  .  ibuprofen (ADVIL,MOTRIN) 200 MG tablet, daily., Disp: , Rfl:  .  Mag Aspart-Potassium Aspart (RA POTASSIUM/MAGNESIUM) 250-250 MG CAPS, daily, Disp: , Rfl:  .  Omega-3 Fatty Acids (FISH OIL PO), Take by mouth., Disp: , Rfl:  .  ondansetron (ZOFRAN) 8 MG tablet, as needed With infusions, Disp: , Rfl:  .  potassium chloride (MICRO-K) 10 MEQ CR capsule, Take by mouth., Disp: , Rfl:  .  ranitidine (ZANTAC) 300 MG capsule, Take by mouth., Disp: , Rfl:  .  TURMERIC PO, turmeric  1000 mg daily, Disp: , Rfl:  .  vitamin B-12 (CYANOCOBALAMIN) 1000 MCG tablet, Take by mouth., Disp: , Rfl:  .  B Complex Vitamins (B COMPLEX 1 PO), B Complex  150 mg daily, Disp: , Rfl:  .  dalfampridine 10 MG TB12, Take 1 tablet (10 mg total) by mouth 2 (two) times daily., Disp: 60 tablet, Rfl: 11 .  lamoTRIgine (LAMICTAL) 100 MG tablet, Take 1 tablet (100 mg total) by mouth 2 (two) times daily. (Patient not taking: Reported on 10/16/2017), Disp: 60  tablet, Rfl: 5 .  mirabegron ER (MYRBETRIQ) 50 MG TB24 tablet, Take by mouth., Disp: , Rfl:  .  nitrofurantoin (MACRODANTIN) 100 MG capsule, Take 1 capsule (100 mg total) by mouth 2 (two) times daily., Disp: 14 capsule, Rfl: 0 .  nitrofurantoin, macrocrystal-monohydrate, (MACROBID) 100 MG capsule, Take 1 capsule (100 mg total) by mouth 2 (two) times daily., Disp: 14 capsule, Rfl: 0  PAST MEDICAL HISTORY: Past Medical History:  Diagnosis Date  . Multiple sclerosis (HCC)   . Vision abnormalities     PAST SURGICAL HISTORY:   FAMILY HISTORY: Family History  Problem Relation Age of Onset  . Diabetes Mother   . Healthy Brother     SOCIAL HISTORY:  Social History   Socioeconomic History  . Marital status: Married    Spouse name: Not on file  . Number of children: Not on file  . Years of education: Not on file  . Highest education level: Not on file  Occupational History  . Not on file  Social Needs  . Financial resource strain: Not on file  . Food insecurity:    Worry: Not on file    Inability: Not on file  . Transportation needs:    Medical: Not on file    Non-medical: Not on file  Tobacco Use  . Smoking status: Current Every Day Smoker  . Smokeless tobacco: Current User  Substance and Sexual Activity  . Alcohol use: Yes    Comment: 2-3 times per wk  . Drug use: Never  . Sexual activity: Not on file  Lifestyle  . Physical activity:    Days per week: Not on file    Minutes per session: Not on file  . Stress: Not on file  Relationships  . Social connections:    Talks on phone: Not on file    Gets together: Not on file    Attends religious service: Not on file    Active member of club or organization: Not on file  Attends meetings of clubs or organizations: Not on file    Relationship status: Not on file  . Intimate partner violence:    Fear of current or ex partner: Not on file    Emotionally abused: Not on file    Physically abused: Not on file    Forced  sexual activity: Not on file  Other Topics Concern  . Not on file  Social History Narrative  . Not on file     PHYSICAL EXAM  There were no vitals filed for this visit.  There is no height or weight on file to calculate BMI.   General: The patient is well-developed and well-nourished and in no acute distress  Neurologic Exam  Mental status: The patient is alert and oriented x 3 at the time of the examination. The patient has apparent normal recent and remote memory, with an apparently normal attention span and concentration ability.   Speech is normal.  Cranial nerves: Extraocular movements are full.  Facial strength and sensation is normal.  Trapezius strength is normal.. No dysarthria is noted.  The tongue is midline, and the patient has symmetric elevation of the soft palate. No obvious hearing deficits are noted.  Motor:  Muscle bulk is normal.  She has increased muscle tone in both legs, left greater than right.  There is also mild increased muscle tone in the left arm.  The right arm has normal muscle tone..  Strength is 4/5 in the left arm in 3/5 in the left leg and 4-/5 the right leg.  Sensory: Normal sensation to touch and vibration in the arms.  She has reduced sensation to vibration in the left leg.  Coordination: Cerebellar testing reveals good right finger-nose-finger and reduced left finger-nose-finger.  Right heel-to-shin is poor.  She cannot do heel-to-shin on the left.  Gait and station: She needs support to stand.  Her gait is spastic and needs bilateral support.  She cannot do a tandem walk.  Romberg is positive.  Reflexes: Deep tendon reflexes are increased in the legs with spread at the knees and clonus at the ankles, nonsustained bilaterally but more on the left.   plantar responses are flexor.  25 foot timed walk is 26.0 seconds    DIAGNOSTIC DATA (LABS, IMAGING, TESTING) - I reviewed patient records, labs, notes, testing and imaging myself where  available.     ASSESSMENT AND PLAN  Multiple sclerosis (HCC)  High risk medication use  Muscle spasticity  Gait disturbance  Depression with anxiety  Spastic diplegia, acquired, lower extremity (HCC)   1.   Botox injections as follows:  Left medial gastrocnemius - 40 U Left flexor hallucis longus - 20 U Left flexor dig longus - 20 U Left flexor dig brevis - 20 U Left Adductor Hallucis - 10 U Left Add Dig Quinti - 10 U  Right flexor hallucis longus - 20 U Right flexor dig longus - 20 U Right flexor dig brevis - 20 U Right Adductor Hallucis - 10 U Right Add Dig Quinti - 10 U  2.    We discussed an additional year of [place].  We need to get her laboratory reports sent to our site. 3.    Dalfampridine ER 10 mg twice daily. 4.    Return in 3 months or sooner if there are new or worsening neurologic symptoms.  Zalyn Amend A. Epimenio Foot, MD, [facility] 10/16/2017, 5:15 PM Certified in Neurology, Clinical Neurophysiology, Sleep Medicine, Pain Medicine and Neuroimaging  [facility] [address]  [address], [address] [place], [place] [address] [phone]  botox in the past was  Dr. Ilene Qua [facility] [phone])

## 2017-10-16 NOTE — Telephone Encounter (Signed)
Please call pt at [phone]. Pt states Dr. Epimenio Foot spoke to her about giving her a form approving her to be able to get BOTOX under the eyes. Pt. states she left after her appt and Dr. Epimenio Foot forgot to give her the form. Thank you.

## 2017-10-16 NOTE — Telephone Encounter (Signed)
Per RAS, Macrodantin 100mg  bid #14 with 0 refills escribed to [facility] for pt's uti, as discussed at ov today.  Pt. aware/fim

## 2017-10-16 NOTE — Addendum Note (Signed)
Addended by: Candis Schatz I on: 10/16/2017 04:09 PM   Modules accepted: Orders

## 2017-10-24 DIAGNOSIS — Z6831 Body mass index (BMI) 31.0-31.9, adult: Secondary | ICD-10-CM | POA: Diagnosis not present

## 2017-10-24 DIAGNOSIS — M545 Low back pain: Secondary | ICD-10-CM | POA: Diagnosis not present

## 2017-10-24 DIAGNOSIS — G35 Multiple sclerosis: Secondary | ICD-10-CM | POA: Diagnosis not present

## 2017-10-25 ENCOUNTER — Telehealth: Payer: Self-pay | Admitting: Neurology

## 2017-10-25 NOTE — Telephone Encounter (Signed)
Scheduled pt an appt for July. Pt stating she had been told she would need a f/u, advised that the instructions from last office visit said to returning in 2 years. Please contact if office visit isnt required.

## 2017-10-30 ENCOUNTER — Encounter: Payer: Self-pay | Admitting: Neurology

## 2017-10-31 ENCOUNTER — Telehealth: Payer: Self-pay | Admitting: Neurology

## 2017-10-31 NOTE — Telephone Encounter (Addendum)
I called the patient to schedule her apt but she did not answer. I left a VM asking her to call me back.

## 2017-11-01 NOTE — Telephone Encounter (Signed)
Patient returned [facility] called. She is scheduled for 01/23/18 for her Botox.

## 2017-11-18 DIAGNOSIS — N950 Postmenopausal bleeding: Secondary | ICD-10-CM | POA: Diagnosis not present

## 2017-11-18 DIAGNOSIS — N888 Other specified noninflammatory disorders of cervix uteri: Secondary | ICD-10-CM | POA: Diagnosis not present

## 2017-11-19 ENCOUNTER — Encounter: Payer: Self-pay | Admitting: Neurology

## 2017-11-19 MED ORDER — DALFAMPRIDINE ER 10 MG PO TB12: 10.0000 mg | ORAL_TABLET | Freq: Two times a day (BID) | ORAL | 3 refills | Status: DC

## 2017-12-03 DIAGNOSIS — N950 Postmenopausal bleeding: Secondary | ICD-10-CM | POA: Diagnosis not present

## 2017-12-03 DIAGNOSIS — M6281 Muscle weakness (generalized): Secondary | ICD-10-CM | POA: Diagnosis not present

## 2017-12-03 DIAGNOSIS — N3281 Overactive bladder: Secondary | ICD-10-CM | POA: Diagnosis not present

## 2017-12-03 DIAGNOSIS — G35 Multiple sclerosis: Secondary | ICD-10-CM | POA: Diagnosis not present

## 2017-12-19 ENCOUNTER — Ambulatory Visit (INDEPENDENT_AMBULATORY_CARE_PROVIDER_SITE_OTHER): Payer: BLUE CROSS/BLUE SHIELD | Admitting: Neurology

## 2017-12-19 ENCOUNTER — Encounter: Payer: Self-pay | Admitting: Neurology

## 2017-12-19 VITALS — BP 151/93 | HR 110 | Resp 18 | Ht 69.0 in | Wt 209.0 lb

## 2017-12-19 DIAGNOSIS — M62838 Other muscle spasm: Secondary | ICD-10-CM | POA: Diagnosis not present

## 2017-12-19 DIAGNOSIS — R269 Unspecified abnormalities of gait and mobility: Secondary | ICD-10-CM

## 2017-12-19 DIAGNOSIS — G35 Multiple sclerosis: Secondary | ICD-10-CM | POA: Diagnosis not present

## 2017-12-19 DIAGNOSIS — G8220 Paraplegia, unspecified: Secondary | ICD-10-CM | POA: Diagnosis not present

## 2017-12-19 NOTE — Progress Notes (Signed)
[facility]  PATIENT: Sharon Odom DOB: Feb 27, 1971  REFERRING DOCTOR OR PCP:  Claretta Fraise SOURCE: Patient, notes imaging and lab reports, MRI images on CD from neurology,  _________________________________   HISTORICAL during 1 of her exacerbations a couple years ago  CHIEF COMPLAINT:  Chief Complaint  Patient presents with  . Multiple Sclerosis    2nd yr. Lemtrada infusions were done in April 2018.  Sts. Botox in May did not help spasticity. She would like to discuss if she needs a 3rd year of Lemtrada/fim    HISTORY OF PRESENT ILLNESS: Sharon Odom is a 47 year old woman with multiple sclerosis, left greater than right spasticity and poor gait.  Update 12/19/2017: She feels she is continuing to worsen.  Specifically, she notes more left-sided symptoms with weakness and numbness/pain.  At the last visit, Botox injections into the musculature of the foot, ankle and left calf was performed.    She felt she only had about one week of definite benefit with less spasticity but that the symptoms are otherwise similar.  She especially notes that the second toe on the left spasms severely when she bears weight.  Around the house she uses a walker and can also take some steps with a cane.   She got back on Ampyra but is not sure it has helped.   She has left foot drop.  She had previously tried a Chartered loss adjuster with benefit and felt it helped.   She is interested in seeing if that can help her walking.    She reports continued numbness and pain in the left arm now involving the entire hand.  Symptoms first started in 2017 and have gradually worsened the last 6-12 months.    She is noting more weakness in the left hand as well and can no longer open a can/.    The weakness has gradually worsened over the last half year.  The left sided numbness is felt arm, torso and legs.    There are more spasms in her feet.    She had a UTI in May, helped by [place].    She has had more  trouble urinating with lots of urgency and occasional incontinence.  Other times she needs to go but has trouble starting..   She is on Myrbetriq   She was told the MRI 10/15/2017 showed a new lesion and the images show a focus at C3C4 posteriorly to the left.   I reviewed that MRI personally and note that focus at C3-C4.  Additionally, there are foci in the medulla and pons and cerebellum.  They did bring their MRI dated September 14, 2016.  I note that that focus at C3-C4 was present on the April 2018 MRI and actually also appears to be one to the right at C2 in 2018 that does not clearly apparent on the current MRI.     Update 10/16/2017: After the last visit, the baclofen dose was increased but she had trouble tolerating higher doses and did not think that her spasticity improved any.  She continues to report a lot of spasticity in both legs, left greater than right and in the left arm.  She denies spasticity in the right arm.  There continues to phasic spasms superimposed on the tonic spasms predominantly involving both feet and the left leg.  Gait is poor.  Around the home she will use a walker and can get from one room to another.   At the last visit I also  started lamotrigine for her dysesthesias but it was poorly tolerated and she stopped.  Previously she had been unable to tolerate oxcarbazepine.  She has not yet restarted the Ampyra.  In the past, she had received Botox injections into muscles of the left lower leg and both feet.  This helped quite a bit with the first series but less so with the second series.  Since she has moved up to this area, she has not had any more Botox injections.  She had her first course of Lemtrada April 2017 and the second course of April 2018.  She has noted some further progression over the last year and we have discussed an additional third course.  She had shingles earlier this year.  Pain has resolved.  From 08/15/2017:  She was diagnosed in October 2013 due to  difficulty with her handwriting and right hand coordination.   A few weeks later she had gait ataxia and numbness and was referred to MS.   She had an MRI of the brain (was in [place] at the time) and then had a LP confirming the diagnosis.  Initially, she was placed on Copaxone but had severe frequent exacerbations and then went to Tecfidera due to breakthrough exacerbations.  She was referred to [facility] for Tysabri (Feb 2014 to November 2016).  She then moved to [facility] and saw Dr. Ilene [facility] [phone]).   She was always JCV Ab titer positive.    She did well on it but stopped 03/2015 due to safety risk.  She had more spasticity early 2017 that improved with 5 days IV Solumedrol and then started [facility] April 2017.   The infusion went well.    She started having more spasms in her legs later in 2017 and had Botox injections into her feet (some benefit but only for 3-4 weeks).   She had a second course of Botox November 2017 without much benefit.     She had numbness and tingling in her left arm November 2017 and was given an ESI.   Medical MJ did not help.   She had her second course of Lemtrada in April 2018 but the steroid did not help her spasticity as it did the first year.    She saw Dr. Gaynell Face at [facility] a couple times in 2018 and also saw Dr. Aquilla Hacker at [facility].  Currently,   She uses a walker (rarely cane) around her home and is wheeled outside the house.   Earlier this year, she had worsening spasticity with toes very spastic and legs locking up.    The severe leg spasms have improved but she still has spasms in her feet that are severe and painful.   Spasticity is worse on the left (foot, leg and arm) and only in her foot on the right.    She takes 20 mg po qid.   Tizanidine did not help much.   She has some painful dysesthesia but gabapentin had not helped.   She continues to have a lot of pain, worse on her left.     Her left arm is also weak and she can't do many  self care tasks (dressing, etc).    Due to the pain, she was drinking alcohol (2 - 3 drinks).   Clonazepam was tried but doses were low.   She was on Ampyra in the past 2014-2017 but felt it wasn't working and stopped.   She was on both Ampyra and Wellbutrin for at least one  year and there were no seizures.     She has urinary frequency and urgency, helped by Myrbetriq.   Sometimes due to her gait, she cannot get to the bathroom in time.    She had diplopia during 1 of her exacerbations a few years ago.  She continues to have some double vision though she is doing better now.    She never had optic neuritis.    Wellbutrin helped her mood better initially and she is very frustrated and irritable.   She was diagnosed with PBA in May 2018 and started on Nuedexta.   She did better mood-wise but spasticity was worse and she stopped.       She feels cognitive skills are mildly worse with mild short term memory issues and decreased focus and attention.     I have reviewed the MRI of the brain and cervical spine dated 03/19/2017.  The MRI of the brain shows multiple T2/FLAIR hyperintense foci in the cerebellum, pons, left greater than right thalamus and in the periventricular, juxtacortical and deep white matter of both hemispheres.    The MRI of the cervical spine showed a subtle focus Adjacent to C4-C5.  She has a disc protrusion at [facility].  REVIEW OF SYSTEMS: Constitutional: No fevers, chills, sweats, or change in appetite.  She has fatigue  Eyes: As above Ear, nose and throat: No hearing loss, ear pain, nasal congestion, sore throat Cardiovascular: No chest pain, palpitations Respiratory: No shortness of breath at rest or with exertion.   No wheezes GastrointestinaI: No nausea, vomiting, diarrhea, abdominal pain, fecal incontinence Genitourinary:As above  musculoskeletal: No neck pain, back pain Integumentary: No rash, pruritus, skin lesions Neurological: as above Psychiatric: As above Endocrine:  No palpitations, diaphoresis, change in appetite, change in weigh or increased thirst Hematologic/Lymphatic: No anemia, purpura, petechiae. Allergic/Immunologic: No itchy/runny eyes, nasal congestion, recent allergic reactions, rashes  ALLERGIES: Allergies  Allergen Reactions  . Morphine Other (See Comments)    HOME MEDICATIONS:  Current Outpatient Medications:  .  acetaminophen (TYLENOL) 650 MG CR tablet, Take by mouth., Disp: , Rfl:  .  B Complex Vitamins (B COMPLEX 1 PO), B Complex  150 mg daily, Disp: , Rfl:  .  baclofen (LIORESAL) 20 MG tablet, Take 1 tablet (20 mg total) by mouth 4 (four) times daily., Disp: 360 each, Rfl: 3 .  buPROPion (WELLBUTRIN XL) 300 MG 24 hr tablet, Take by mouth., Disp: , Rfl:  .  CASCARA SAGRADA PO, Take by mouth., Disp: , Rfl:  .  Cholecalciferol (VITAMIN D3) 2000 units capsule, Take by mouth., Disp: , Rfl:  .  dalfampridine 10 MG TB12, Take 1 tablet (10 mg total) by mouth 2 (two) times daily., Disp: 180 tablet, Rfl: 3 .  Green Tea, Camillia sinensis, 1000 MG TABS, Green Tea Complex  500 mg daily, Disp: , Rfl:  .  ibuprofen (ADVIL,MOTRIN) 200 MG tablet, daily., Disp: , Rfl:  .  Mag Aspart-Potassium Aspart (RA POTASSIUM/MAGNESIUM) 250-250 MG CAPS, daily, Disp: , Rfl:  .  Omega-3 Fatty Acids (FISH OIL PO), Take by mouth., Disp: , Rfl:  .  ondansetron (ZOFRAN) 8 MG tablet, as needed With infusions, Disp: , Rfl:  .  potassium chloride (MICRO-K) 10 MEQ CR capsule, Take by mouth., Disp: , Rfl:  .  ranitidine (ZANTAC) 300 MG capsule, Take by mouth., Disp: , Rfl:  .  TURMERIC PO, turmeric  1000 mg daily, Disp: , Rfl:  .  vitamin B-12 (CYANOCOBALAMIN) 1000 MCG tablet, Take by  mouth., Disp: , Rfl:  .  mirabegron ER (MYRBETRIQ) 50 MG TB24 tablet, Take by mouth., Disp: , Rfl:   PAST MEDICAL HISTORY: Past Medical History:  Diagnosis Date  . Multiple sclerosis (HCC)   . Vision abnormalities     PAST SURGICAL HISTORY:   FAMILY HISTORY: Family History    Problem Relation Age of Onset  . Diabetes Mother   . Healthy Brother     SOCIAL HISTORY:  Social History   Socioeconomic History  . Marital status: Married    Spouse name: Not on file  . Number of children: Not on file  . Years of education: Not on file  . Highest education level: Not on file  Occupational History  . Not on file  Social Needs  . Financial resource strain: Not on file  . Food insecurity:    Worry: Not on file    Inability: Not on file  . Transportation needs:    Medical: Not on file    Non-medical: Not on file  Tobacco Use  . Smoking status: Current Every Day Smoker  . Smokeless tobacco: Current User  Substance and Sexual Activity  . Alcohol use: Yes    Comment: 2-3 times per wk  . Drug use: Never  . Sexual activity: Not on file  Lifestyle  . Physical activity:    Days per week: Not on file    Minutes per session: Not on file  . Stress: Not on file  Relationships  . Social connections:    Talks on phone: Not on file    Gets together: Not on file    Attends religious service: Not on file    Active member of club or organization: Not on file    Attends meetings of clubs or organizations: Not on file    Relationship status: Not on file  . Intimate partner violence:    Fear of current or ex partner: Not on file    Emotionally abused: Not on file    Physically abused: Not on file    Forced sexual activity: Not on file  Other Topics Concern  . Not on file  Social History Narrative  . Not on file     PHYSICAL EXAM  Vitals:   12/19/17 1529  BP: (!) 151/93  Pulse: (!) 110  Resp: 18  Weight: 209 lb (94.8 kg)  Height: 5\' 9"  (1.753 m)    Body mass index is 30.86 kg/m.   General: The patient is well-developed and well-nourished and in no acute distress  Neurologic Exam  Mental status: The patient is alert and oriented x 3 at the time of the examination. The patient has apparent normal recent and remote memory, with an apparently normal  attention span and concentration ability.   Speech is normal.  Cranial nerves: Extraocular movements are full.  Facial strength and sensation is normal.  Trapezius strength is normal.. No dysarthria is noted.  The tongue is midline, and the patient has symmetric elevation of the soft palate. No obvious hearing deficits are noted.  Motor:  Muscle bulk is normal.  She has increased muscle tone in both legs, left greater than right.  There is also mild increased muscle tone in the left arm.  The right arm has normal muscle tone.  When she stands, she has spasticity noted in the feet, worse on the left.  Strength is 4/5 in the left arm in 3/5 in the left leg and 4-/5 the right leg.  Sensory: Normal  sensation to touch and vibration in the arms.  She has reduced sensation to vibration in the left leg.  Coordination: Cerebellar testing reveals good right finger-nose-finger and reduced left finger-nose-finger.  Right heel-to-shin is poor.  She cannot do heel-to-shin on the left.  Gait and station: She needs support to stand.  She has a spastic gait but can use a cane.  She is unable to tandem walk.  Romberg is positive.e.  Reflexes: Deep tendon reflexes are increased in the legs with spread at the knees and clonus at the ankles, nonsustained bilaterally but more on the left.   plantar responses are flexor.  25 foot timed walk is 26.0 seconds    DIAGNOSTIC DATA (LABS, IMAGING, TESTING) - I reviewed patient records, labs, notes, testing and imaging myself where available.     ASSESSMENT AND PLAN  Multiple sclerosis (HCC) - Plan: Ambulatory referral to Physical Therapy  Spastic diplegia, acquired, lower extremity (HCC) - Plan: Ambulatory referral to Physical Therapy  Muscle spasticity - Plan: Ambulatory referral to Physical Therapy  Gait disturbance - Plan: Ambulatory referral to Physical Therapy   1.   We will do Botox again next mont.  I will increase the dose to 300 units when we inject  next month.  His 2.    We discussed an additional year of Lemtrada or changing to Ocrevus.  We need to get her laboratory reports sent to our site.    3.    Dalfampridine ER 10 mg twice daily did not help her as much as the brand name and we can try to get the brand covered.    4.    We will have her evaluated for a right WalkAide device.   5.    Return in 3 months or sooner if there are new or worsening neurologic symptoms.  55-minute face-to-face evaluation with greater than one half the time counseling and coordinating care regarding her multiple neurologic symptoms related to her MS.  Yehudah Standing A. Epimenio Foot, MD, [facility] 12/19/2017, 6:09 PM Certified in Neurology, Clinical Neurophysiology, Sleep Medicine, Pain Medicine and Neuroimaging  [facility] [address] [place], [place] [address] [phone]

## 2017-12-20 ENCOUNTER — Encounter: Payer: Self-pay | Admitting: *Deleted

## 2017-12-20 ENCOUNTER — Telehealth: Payer: Self-pay | Admitting: Neurology

## 2017-12-20 NOTE — Telephone Encounter (Signed)
No Sharon Odom is aware . Thanks Faith she will process.

## 2017-12-20 NOTE — Telephone Encounter (Signed)
Noted/fim 

## 2017-12-20 NOTE — Telephone Encounter (Signed)
Dr. Epimenio Foot want's to up Botox 100 unit's came and gave verbal .

## 2018-01-06 ENCOUNTER — Encounter: Payer: Self-pay | Admitting: Neurology

## 2018-01-06 DIAGNOSIS — G35 Multiple sclerosis: Secondary | ICD-10-CM | POA: Diagnosis not present

## 2018-01-06 DIAGNOSIS — D3131 Benign neoplasm of right choroid: Secondary | ICD-10-CM | POA: Diagnosis not present

## 2018-01-06 DIAGNOSIS — H5123 Internuclear ophthalmoplegia, bilateral: Secondary | ICD-10-CM | POA: Diagnosis not present

## 2018-01-08 DIAGNOSIS — N3281 Overactive bladder: Secondary | ICD-10-CM | POA: Diagnosis not present

## 2018-01-08 DIAGNOSIS — F419 Anxiety disorder, unspecified: Secondary | ICD-10-CM | POA: Diagnosis not present

## 2018-01-08 DIAGNOSIS — M5412 Radiculopathy, cervical region: Secondary | ICD-10-CM | POA: Diagnosis not present

## 2018-01-08 DIAGNOSIS — M6281 Muscle weakness (generalized): Secondary | ICD-10-CM | POA: Diagnosis not present

## 2018-01-16 ENCOUNTER — Ambulatory Visit: Payer: BLUE CROSS/BLUE SHIELD | Attending: Internal Medicine | Admitting: Physical Therapy

## 2018-01-16 ENCOUNTER — Telehealth: Payer: Self-pay | Admitting: Neurology

## 2018-01-16 ENCOUNTER — Other Ambulatory Visit: Payer: Self-pay

## 2018-01-16 DIAGNOSIS — R2689 Other abnormalities of gait and mobility: Secondary | ICD-10-CM | POA: Insufficient documentation

## 2018-01-16 DIAGNOSIS — G8114 Spastic hemiplegia affecting left nondominant side: Secondary | ICD-10-CM | POA: Diagnosis not present

## 2018-01-16 DIAGNOSIS — M21372 Foot drop, left foot: Secondary | ICD-10-CM | POA: Diagnosis not present

## 2018-01-16 NOTE — Therapy (Signed)
[facility] [address] [place], [place], [address] Phone: [phone]   Fax:  [phone]  Physical Therapy Evaluation  Patient Details  Name: Sharon Odom MRN: [id number] Date of Birth: [birth year] Referring Provider: Asa Lente, MD   Encounter Date: 01/16/2018  PT End of Session - 01/16/18 2010    Visit Number  1    Number of Visits  1   Bioness eval only today   Authorization Type  [employer]BS out of state    PT Start Time  1530    PT Stop Time  1638    PT Time Calculation (min)  68 min    Equipment Utilized During Treatment  Other (comment)   Bioness   Activity Tolerance  Patient tolerated treatment well    Behavior During Therapy  [facility] for tasks assessed/performed       Past Medical History:  Diagnosis Date  . Multiple sclerosis (HCC)   . Vision abnormalities     Past Surgical History:  Procedure Laterality Date  . EXCISION MORTON'S NEUROMA Right     There were no vitals filed for this visit.   Subjective Assessment - 01/16/18 1539    Subjective  Pt diagnosed with MS 6 years with L hemiparesis.  Pt previously lived in [facility] and was relatively stable.  When she moved to [place] she began to have more L sided weakness and L foot drop.  Pt is here to be evaluated for functional electrical stimulation for foot drop.    Patient is accompained by:  Family member    Pertinent History  Has used a walk aide on RLE. MS x 6 years.  Diplopia.  Anxiety and Depression    Limitations  Standing;Walking    How long can you stand comfortably?  5-10 minutes    How long can you walk comfortably?  household distances    Patient Stated Goals  To obtain a functional electrical stimulation unit to assist with foot drop during gait.  Pt is unwilling to wear an AFO    Currently in Pain?  Yes    Pain Location  Arm    Pain Orientation  Left    Pain Descriptors / Indicators  Pins and needles;Tightness    Pain Type  Neuropathic pain         [facility] PT Assessment - 01/16/18 1544      Assessment   Medical Diagnosis  MS - L foot drop    Referring Provider  Asa Lente, MD    Onset Date/Surgical Date  --   diagnosed in 2013   Prior Therapy  in [facility] for [facility]; evaluated for R Walk Aide      Precautions   Precautions  Fall    Precaution Comments  MS x 6 years, diplopia, spasticity, anxiety/depression      Balance Screen   Has the patient fallen in the past 6 months  No    Has the patient had a decrease in activity level because of a fear of falling?   Yes    Is the patient reluctant to leave their home because of a fear of falling?   Yes      Home Environment   Living Environment  Private residence    Living Arrangements  Spouse/significant other    Type of Home  House    Home Access  Stairs to enter    Entrance Stairs-Number of Steps  3-4    Entrance Stairs-Rails  Right;Left  Home Layout  Two level;Able to live on main level with bedroom/bathroom    Home Equipment  Walker - 2 wheels;Cane - single point;Electric scooter;Wheelchair - manual      Prior Function   Level of Independence  Independent with household mobility with device;Requires assistive device for independence   RW for household distances; w/c or scooter for community     Sensation   Light Touch  Impaired Detail    Light Touch Impaired Details  Impaired LLE    Proprioception  Appears Intact      Tone   Assessment Location  Left Lower Extremity      ROM / Strength   AROM / PROM / Strength  Strength      Strength   Overall Strength  Deficits    Overall Strength Comments  RLE: WFL.  LLE: 1/5 hip flexion, knee extension 3/5, knee flexion 3/5, ankle DF 3/5 but delayed      Ambulation/Gait   Ambulation/Gait  Yes    Ambulation/Gait Assistance  4: Min assist    Ambulation/Gait Assistance Details  performed gait first without use of BIONESS with the following impairments below.  With use of Bioness on L lower LE and hamstring mm group pt  demonstrated improved knee control in stance (decreased recurvatum), improved L foot clearance and step length    Ambulation Distance (Feet)  100 Feet    Assistive device  Rolling walker    Gait Pattern  Step-to pattern;Decreased step length - left;Decreased step length - right;Decreased stance time - left;Decreased stride length;Decreased hip/knee flexion - left;Decreased dorsiflexion - left;Left genu recurvatum;Poor foot clearance - left    Ambulation Surface  Level;Indoor    Stairs  Yes    Stairs Assistance  4: Min assist    Stairs Assistance Details (indicate cue type and reason)  assistance required to clear and advance LLE when descending    Stair Management Technique  Two rails;Step to pattern;Forwards    Number of Stairs  4    Height of Stairs  6      LLE Tone   LLE Tone  Modified Ashworth      LLE Tone   Modified Ashworth Scale for Grading Hypertonia LLE  Slight increase in muscle tone, manifested by a catch, followed by minimal resistance throughout the remainder (less than half) of the ROM                Objective measurements completed on examination: See above findings.      [facility] Adult PT Treatment/Exercise - 01/16/18 1544      Modalities   Modalities  Electrical Stimulation      Electrical Stimulation   Electrical Stimulation Location  L anterior tibialis and L hamstring    Electrical Stimulation Action  closed and open chain ankle DF, knee flexion and hip extension    Electrical Stimulation Parameters  Parameters saved in Tablet 1; quick fit electrodes    Electrical Stimulation Goals  Strength;Tone;Pain;Neuromuscular facilitation             PT Education - 01/16/18 2008    Education Details  purpose of Bioness, process of obtaining Bioness, information on [place] [employer] MS Society, pt would be required to return to outpatient therapy for further training with Bioness if she pursues purchase    Person(s) Educated  Patient;Spouse    Methods   Explanation    Comprehension  Verbalized understanding  Plan - 01/16/18 2011    Clinical Impression Statement  Pt is a 47 year old female referred to Neuro OPPT for evaluation of Bioness functional electrical stimulation.  Pt's PMH is significant for the following: multiple sclerosis x 6 years, diplopia, anxiety and depression.  The following deficits were noted during pt's exam:  L hemiplegia, increased spasticity, pain in LLE, L foot drop, impaired balance and impaired gait.  With utilization of the Bioness L300 lower cuff and thigh cuff on the hamstring muscle group, the patient demonstrated improved L knee control in stance phase, improved foot clearance with swing phase and improved ability to increase gait speed. Pt would benefit from daily use of a home Bioness functional electrical stimulation unit in order to maximize functional mobility independence and reduce falls risk.  Pt seen for evaluation of Bioness unit today only; no f/u appointments made as pt wishes to participate in HHPT at this time (due to transportation issues).  Pt is agreeable to return to outpatient PT for training and education with Bioness unit if she is able to obtain a home unit.      History and Personal Factors relevant to plan of care:  MS x 6 years, had good results with use of Walk Aide in [place] for R sided foot drop, diplopia, spasticity, anxiety/depression, refuses to wear AFO    Clinical Presentation  Stable    Clinical Presentation due to:  MS x 6 years, had good results with use of Walk Aide in [place] for R sided foot drop, diplopia, spasticity, anxiety/depression, refuses to wear AFO    Clinical Decision Making  Low    Rehab Potential  Good    PT Frequency  One time visit    PT Duration  Other (comment)   Bioness eval only   PT Treatment/Interventions  Electrical Stimulation    Consulted and Agree with Plan of Care  Patient;Family member/caregiver    Family Member Consulted   Husband - Theodoro Grist       Patient will benefit from skilled therapeutic intervention in order to improve the following deficits and impairments:  Abnormal gait, Decreased balance, Decreased endurance, Decreased mobility, Decreased strength, Difficulty walking, Impaired sensation, Pain  Visit Diagnosis: Spastic hemiplegia of left nondominant side due to noncerebrovascular etiology (HCC)  Foot drop, left  Other abnormalities of gait and mobility     Problem List Patient Active Problem List   Diagnosis Date Noted  . Spastic diplegia, acquired, lower extremity (HCC) 10/16/2017  . Muscle spasticity 08/15/2017  . Gait disturbance 08/15/2017  . Depression with anxiety 08/15/2017  . Diplopia 08/15/2017  . Multiple sclerosis (HCC) 03/04/2017  . High risk medication use 02/06/2017    Dierdre Highman, PT, DPT 01/16/18    8:24 PM    [facility] [address] [place], [place], [address] Phone: [phone]   Fax:  [phone]  Name: Sharon Odom MRN: [id number] Date of Birth: 12/16/1970

## 2018-01-16 NOTE — Telephone Encounter (Signed)
I called in a new rx for the patient to [facility] phone number is [phone].

## 2018-01-21 NOTE — Patient Instructions (Signed)
LMN for Bioness functional electrical stimulation L300 Go system:   January 21, 2018  To Whom It May Concern,  I am writing on behalf of my patient, Sharon Odom, to request authorization for a medically necessary Bioness L300Go Plus System designed to aid patients with foot drop and knee instability. Sharon Odom has a diagnosis of Multiple Sclerosis with subsequent gait dysfunction, and lower extremity weakness with foot drop.     Sharon Odom requires a rolling walker to ensure safety during ambulation very short distances in her home and requires the use of a manual wheelchair for longer community distances.  She lacks dorsiflexion and hip flexion and knee flexion necessary to clear her left foot during swing.  She also experiences knee hyperextension in stance.  Due to the muscle weakness and spasticity caused by MS, Sharon Odom has developed a pathologic and inefficient gait pattern.  She demonstrates significantly slowed gait velocity, decreased left heel strike, left knee hyperextension in stance and left foot drag during swing.  The L300Go plus allows Sharon Odom to ambulate with improved foot clearance, step length and heel strike and marked decrease in knee hyperextension.   This in turn can decrease the risk for falls and allow Sharon Odom to ambulate at an increased gait velocity for longer distances with decreased assistance.    Sharon Odom has used the L300Go plus for her left side in physical therapy with great results; she also previously used functional electrical stimulation on her R leg and demonstrated significant improvements in leg strength and gait. The device fosters improved mobility, safety and overall quality of life.   I urge you to consider the request for coverage as this device can help to prevent further medical costs due to falls and/or deterioration of functional status.                                                  .  Thank you for your time,     Temple Pacini. Ellender Hose, DPT [facility] [address], [address] [place], [place] [address] [phone] (office) [phone] (fax)   The L300 Go System  The L300 Go System Is as an advanced wireless software-driven system designed for individuals with foot drop. following an upper motor neuron injury/disease, who are seeking to improve their ability to walk mere normally.  The L.300 Go uses technology based on functional electrical stimulation (FES) to provide low-level stimulation to the anterior tibialis and peroneal musculature. This stimulation is delivered in a precise sequence to provide dorsiflexion of the ankle to lift the foot and toes during the swing phase of gait, thereby improving an individual's gait. Individuals with impaired gait have less control over their lower extremity muscles and are at an increased risk for falls. The L300 Go is the first functional electrical stimulation (FES) system to offer 3D motion detection of gait events and muscle activation using data from a 3-axis gyroscope and accelerometer. Patient movement is monitored in all three kinematic planes and stimulation is deployed precisely when needed during the gait cycle. An adaptive, learning algorithm accommodates changes in gait dynamics, and a high-speed processor deploys stimulation within 10 milliseconds of detecting a valid gait event. This rapid, reliable response is critical and supports user confidence. Multi-channel stimulation is an additional noteworthy L300 GO feature that allows clinicians to precisely control the amount of dorsiflexion  and inversion/eversion the system provides. The L300 Go only requires set-up and electrode placement one time, after which the patient can easily apply the device. After approximately 2 weeks, users are able to wear the L300 Go throughout the day.   The new L300Go device builds on the NESS L300 and L300 plus systems and received FDA clearance on June 24, 2015. The device may  also facilitate muscle reeducation. prevent disuse atrophy, increase joint range of motion, and increase local blood flow. Clinical evidence suggests that L300 Go users may experience improved symmetry and rhythmicity of gait, increased walking speed, and improved stability.

## 2018-01-23 ENCOUNTER — Ambulatory Visit (INDEPENDENT_AMBULATORY_CARE_PROVIDER_SITE_OTHER): Payer: BLUE CROSS/BLUE SHIELD | Admitting: Neurology

## 2018-01-23 ENCOUNTER — Other Ambulatory Visit: Payer: Self-pay

## 2018-01-23 ENCOUNTER — Encounter: Payer: Self-pay | Admitting: Neurology

## 2018-01-23 ENCOUNTER — Telehealth: Payer: Self-pay | Admitting: Neurology

## 2018-01-23 VITALS — BP 134/83 | HR 80 | Resp 16 | Ht 69.0 in | Wt 209.0 lb

## 2018-01-23 DIAGNOSIS — G35 Multiple sclerosis: Secondary | ICD-10-CM

## 2018-01-23 DIAGNOSIS — Z79899 Other long term (current) drug therapy: Secondary | ICD-10-CM

## 2018-01-23 DIAGNOSIS — M62838 Other muscle spasm: Secondary | ICD-10-CM | POA: Diagnosis not present

## 2018-01-23 DIAGNOSIS — G8220 Paraplegia, unspecified: Secondary | ICD-10-CM

## 2018-01-23 DIAGNOSIS — R269 Unspecified abnormalities of gait and mobility: Secondary | ICD-10-CM

## 2018-01-23 MED ORDER — ACETAMINOPHEN-CODEINE #3 300-30 MG PO TABS: 1.0000 | ORAL_TABLET | Freq: Three times a day (TID) | ORAL | 2 refills | Status: DC | PRN

## 2018-01-23 MED ORDER — BACLOFEN 20 MG PO TABS: ORAL_TABLET | ORAL | 3 refills | Status: DC

## 2018-01-23 NOTE — Telephone Encounter (Signed)
[employer] Auth: [id number] (exp. 01/23/18 to 03/23/18).  I spoke to the patient she stated she will call me back to schedule her MRI's.. She asked me if I could schedule her Botox appt as well and stated I can and we can schedule both when she calls me back.

## 2018-01-23 NOTE — Telephone Encounter (Signed)
3 mo btx 400 units

## 2018-01-23 NOTE — Progress Notes (Signed)
[facility]  PATIENT: Sharon Odom DOB: 13-Mar-1971  REFERRING DOCTOR OR PCP:  Claretta Fraise SOURCE: Patient, notes imaging and lab reports, MRI images on CD from neurology,  _________________________________   HISTORICAL during 1 of her exacerbations a couple years ago  CHIEF COMPLAINT:  Chief Complaint  Patient presents with  . Multiple Sclerosis    Awaiting 3rd yr. of Lemtrada infusions. Here today for botox inj. for left leg spasticity. Botox 100u, 3 vials.  Lot# [id number]. Exp. 05/2020.  NDC 0023-1145-01/fim    HISTORY OF PRESENT ILLNESS: Sharon Odom is a 47 year old woman with multiple sclerosis, left greater than right spasticity and poor gait.  Update 01/23/2018: Her main problem continues to be spasticity in the legs, left greater than right.  Associated with the spasticity she has quite a bit of pain.  She felt she received benefit from the Botox injections into the legs for a few weeks but then the spasticity returned to the same level.  We discussed using a larger dose today and will consider increasing the dose further based on her response.  She is able to tolerate the baclofen better and has been able to increase the dose.  Gait is poor.  Ampyra seems to be helping a little bit.  She had 2 years of Lemtrada therapy, with the last dose about 15 months ago.  We are in the process of trying to get an additional year of [place] approved.    Update 10/16/2017: After the last visit, the baclofen dose was increased but she had trouble tolerating higher doses and did not think that her spasticity improved any.  She continues to report a lot of spasticity in both legs, left greater than right and in the left arm.  She denies spasticity in the right arm.  There continues to phasic spasms superimposed on the tonic spasms predominantly involving both feet and the left leg.  Gait is poor.  Around the home she will use a walker and can get from one room to  another.   At the last visit I also started lamotrigine for her dysesthesias but it was poorly tolerated and she stopped.  Previously she had been unable to tolerate oxcarbazepine.  She has not yet restarted the Ampyra.  In the past, she had received Botox injections into muscles of the left lower leg and both feet.  This helped quite a bit with the first series but less so with the second series.  Since she has moved up to this area, she has not had any more Botox injections.  She had her first course of Lemtrada April 2017 and the second course of April 2018.  She has noted some further progression over the last year and we have discussed an additional third course.  She had shingles earlier this year.  Pain has resolved.  From 08/15/2017:  She was diagnosed in October 2013 due to difficulty with her handwriting and right hand coordination.   A few weeks later she had gait ataxia and numbness and was referred to MS.   She had an MRI of the brain (was in [place] at the time) and then had a LP confirming the diagnosis.  Initially, she was placed on Copaxone but had severe frequent exacerbations and then went to Tecfidera due to breakthrough exacerbations.  She was referred to [facility] for Tysabri (Feb 2014 to November 2016).  She then moved to [facility] and saw Dr. Ilene Qua [facility] [phone]).   She was always JCV  Ab titer positive.    She did well on it but stopped 03/2015 due to safety risk.  She had more spasticity early 2017 that improved with 5 days IV Solumedrol and then started [facility] April 2017.   The infusion went well.    She started having more spasms in her legs later in 2017 and had Botox injections into her feet (some benefit but only for 3-4 weeks).   She had a second course of Botox November 2017 without much benefit.     She had numbness and tingling in her left arm November 2017 and was given an ESI.   Medical MJ did not help.   She had her second course of Lemtrada in April 2018  but the steroid did not help her spasticity as it did the first year.    She saw Dr. Gaynell Face at [facility] a couple times in 2018 and also saw Dr. Aquilla Hacker at [facility].  Currently,   She uses a walker (rarely cane) around her home and is wheeled outside the house.   Earlier this year, she had worsening spasticity with toes very spastic and legs locking up.    The severe leg spasms have improved but she still has spasms in her feet that are severe and painful.   Spasticity is worse on the left (foot, leg and arm) and only in her foot on the right.    She takes 20 mg po qid.   Tizanidine did not help much.   She has some painful dysesthesia but gabapentin had not helped.   She continues to have a lot of pain, worse on her left.     Her left arm is also weak and she can't do many self care tasks (dressing, etc).    Due to the pain, she was drinking alcohol (2 - 3 drinks).   Clonazepam was tried but doses were low.   She was on Ampyra in the past 2014-2017 but felt it wasn't working and stopped.   She was on both Ampyra and Wellbutrin for at least one year and there were no seizures.     She has urinary frequency and urgency, helped by Myrbetriq.   Sometimes due to her gait, she cannot get to the bathroom in time.    She had diplopia during 1 of her exacerbations a few years ago.  She continues to have some double vision though she is doing better now.    She never had optic neuritis.    Wellbutrin helped her mood better initially and she is very frustrated and irritable.   She was diagnosed with PBA in May 2018 and started on Nuedexta.   She did better mood-wise but spasticity was worse and she stopped.       She feels cognitive skills are mildly worse with mild short term memory issues and decreased focus and attention.     I have reviewed the MRI of the brain and cervical spine dated 03/19/2017.  The MRI of the brain shows multiple T2/FLAIR hyperintense foci in the cerebellum, pons, left  greater than right thalamus and in the periventricular, juxtacortical and deep white matter of both hemispheres.    The MRI of the cervical spine showed a subtle focus Adjacent to C4-C5.  She has a disc protrusion at [facility].  REVIEW OF SYSTEMS: Constitutional: No fevers, chills, sweats, or change in appetite.  She reports fatigue. Eyes: As above Ear, nose and throat: No hearing loss, ear pain, nasal congestion, sore  throat Cardiovascular: No chest pain, palpitations Respiratory: No shortness of breath at rest or with exertion.   No wheezes GastrointestinaI: No nausea, vomiting, diarrhea, abdominal pain, fecal incontinence Genitourinary:As above  musculoskeletal:She has pain in her legs, left greater than right due to spasticity.  She notes mild back pain. Integumentary: No rash, pruritus, skin lesions Neurological: as above Psychiatric: As above Endocrine: No palpitations, diaphoresis, change in appetite, change in weigh or increased thirst Hematologic/Lymphatic: No anemia, purpura, petechiae. Allergic/Immunologic: No itchy/runny eyes, nasal congestion, recent allergic reactions, rashes  ALLERGIES: Allergies  Allergen Reactions  . Morphine Other (See Comments)    HOME MEDICATIONS:  Current Outpatient Medications:  .  acetaminophen (TYLENOL) 650 MG CR tablet, Take by mouth., Disp: , Rfl:  .  Alemtuzumab (LEMTRADA) 12 MG/1.2ML SOLN, Inject 12 mg into the vein., Disp: , Rfl:  .  B Complex Vitamins (B COMPLEX 1 PO), B Complex  150 mg daily, Disp: , Rfl:  .  baclofen (LIORESAL) 20 MG tablet, Take up to 6 pills daily for MS spasticity, Disp: 540 each, Rfl: 3 .  buPROPion (WELLBUTRIN XL) 300 MG 24 hr tablet, Take by mouth., Disp: , Rfl:  .  CASCARA SAGRADA PO, Take by mouth., Disp: , Rfl:  .  Cholecalciferol (VITAMIN D-3) 5000 units TABS, Take by mouth., Disp: , Rfl:  .  Cyanocobalamin (VITAMIN B-12) 2500 MCG SUBL, Place under the tongue., Disp: , Rfl:  .  dalfampridine (AMPYRA) 10 MG  TB12, Take 10 mg by mouth 2 (two) times daily., Disp: , Rfl:  .  Green Tea, Camillia sinensis, 1000 MG TABS, Green Tea Complex  500 mg daily, Disp: , Rfl:  .  ibuprofen (ADVIL,MOTRIN) 200 MG tablet, daily., Disp: , Rfl:  .  Mag Aspart-Potassium Aspart (RA POTASSIUM/MAGNESIUM) 250-250 MG CAPS, daily, Disp: , Rfl:  .  Omega-3 Fatty Acids (FISH OIL PO), Take by mouth., Disp: , Rfl:  .  ondansetron (ZOFRAN) 8 MG tablet, as needed With infusions, Disp: , Rfl:  .  potassium chloride (MICRO-K) 10 MEQ CR capsule, Take by mouth., Disp: , Rfl:  .  ranitidine (ZANTAC) 300 MG capsule, Take by mouth., Disp: , Rfl:  .  TURMERIC PO, turmeric  1000 mg daily, Disp: , Rfl:  .  mirabegron ER (MYRBETRIQ) 50 MG TB24 tablet, Take by mouth., Disp: , Rfl:   PAST MEDICAL HISTORY: Past Medical History:  Diagnosis Date  . Multiple sclerosis (HCC)   . Vision abnormalities     PAST SURGICAL HISTORY:   FAMILY HISTORY: Family History  Problem Relation Age of Onset  . Diabetes Mother   . Healthy Brother     SOCIAL HISTORY:  Social History   Socioeconomic History  . Marital status: Married    Spouse name: Not on file  . Number of children: Not on file  . Years of education: Not on file  . Highest education level: Not on file  Occupational History  . Not on file  Social Needs  . Financial resource strain: Not on file  . Food insecurity:    Worry: Not on file    Inability: Not on file  . Transportation needs:    Medical: Not on file    Non-medical: Not on file  Tobacco Use  . Smoking status: Current Every Day Smoker  . Smokeless tobacco: Current User  Substance and Sexual Activity  . Alcohol use: Yes    Comment: 2-3 times per wk  . Drug use: Never  . Sexual activity: Not  on file  Lifestyle  . Physical activity:    Days per week: Not on file    Minutes per session: Not on file  . Stress: Not on file  Relationships  . Social connections:    Talks on phone: Not on file    Gets together: Not  on file    Attends religious service: Not on file    Active member of club or organization: Not on file    Attends meetings of clubs or organizations: Not on file    Relationship status: Not on file  . Intimate partner violence:    Fear of current or ex partner: Not on file    Emotionally abused: Not on file    Physically abused: Not on file    Forced sexual activity: Not on file  Other Topics Concern  . Not on file  Social History Narrative  . Not on file     PHYSICAL EXAM  Vitals:   01/23/18 0817  BP: 134/83  Pulse: 80  Resp: 16  Weight: 209 lb (94.8 kg)  Height: 5\' 9"  (1.753 m)    Body mass index is 30.86 kg/m.   General: The patient is well-developed and well-nourished and in no acute distress  Neurologic Exam  Mental status: The patient is alert and oriented x 3 at the time of the examination. The patient has apparent normal recent and remote memory, with an apparently normal attention span and concentration ability.   Speech is normal.  Cranial nerves: Extraocular movements are full.  Facial strength and sensation is normal.  The tongue is midline, and the patient has symmetric elevation of the soft palate. No obvious hearing deficits are noted.  Motor:  Muscle bulk is normal.  There is increased muscle tone in the legs, left greater than right.  She has moderate increased muscle tone in the left arm.  The right arm has normal muscle tone.  Strength is 4to 4+/5 in the left arm, 3-4-/5 in the left leg and 4- to 4/5 in the right leg   Sensory: Normal sensation to touch and vibration in the arms.  She has reduced sensation to vibration in the left leg.  Coordination: Cerebellar testing reveals good right finger-nose-finger and reduced left finger-nose-finger.  Right heel-to-shin is poor.  She cannot do heel-to-shin on the left.  Gait and station: She needs support to stand.  Her gait is spastic and needs bilateral support.  She cannot do a tandem walk.  She is walking  faster since starting the Ampyra Romberg is positive.  Reflexes: Deep tendon reflexes are increased in the legs with spread at the knees and clonus at the ankles, nonsustained bilaterally but more on the left.   plantar responses are flexor.     DIAGNOSTIC DATA (LABS, IMAGING, TESTING) - I reviewed patient records, labs, notes, testing and imaging myself where available.     ASSESSMENT AND PLAN  Spastic diplegia, acquired, lower extremity (HCC)  Multiple sclerosis (HCC)  High risk medication use  Muscle spasticity  Gait disturbance   1.   Botox 300 Units injections as follows:  Left medial gastrocnemius - 60 U    Left flexor hallucis longus - 30 U Left flexor dig longus - 30 U Left flexor dig brevis - 30 U Left Adductor Hallucis - 20 U Left Add Dig Quinti - 10 U  Right flexor hallucis longus - 30 U Right flexor dig longus - 30 U Right flexor dig brevis - 30 U Right  Adductor Hallucis - 15 U Right Add Dig Quinti - 15 U  2.    We are trying to get the Lemtrada REMS program transfer to our site.  Once we are able to do this we can have her get a third year of [place]. 3.    Continue Dalfampridine ER 10 mg twice daily. 4.    Return in 3 months or sooner if there are new or worsening neurologic symptoms.   Addendum:   I personally reviewed the MRI of the cervical spine from 10/15/2017 and compared it side-by-side to the MRI from 09/14/2016.  The 2019 MRI clearly shows a small T2 hyperintense focus posteriorly to the left adjacent to C3-C4.  Although it is more subtle, it is also noted on the previous MRI.  There also might be a small focus adjacent to C2 to the right in 2018 that is not present on the 2019 MRI. ---RAS   Barb Shear A. Epimenio Foot, MD, [place] 01/23/2018, 9:10 AM Certified in Neurology, Clinical Neurophysiology, Sleep Medicine, Pain Medicine and Neuroimaging  [facility] [address] [place], [place] [address] [phone]

## 2018-01-24 NOTE — Telephone Encounter (Signed)
Pt returning Sharon Odom's call  °

## 2018-01-24 NOTE — Telephone Encounter (Signed)
I spoke with the patient she is scheduled for 02/05/18 at [facility].  Her botox is also scheduled for 04/18/18. Duwayne Heck is that okay?

## 2018-01-28 ENCOUNTER — Telehealth: Payer: Self-pay | Admitting: *Deleted

## 2018-01-28 NOTE — Telephone Encounter (Signed)
Noted, that is ok.

## 2018-01-28 NOTE — Telephone Encounter (Signed)
PA for Dalfampridine 10mg  #60/30 completed and faxed to [facility], fax# [phone]/fim

## 2018-01-31 NOTE — Telephone Encounter (Signed)
Completed.

## 2018-02-02 ENCOUNTER — Telehealth: Payer: Self-pay | Admitting: Physical Therapy

## 2018-02-02 NOTE — Telephone Encounter (Signed)
Hello Dr. Epimenio Foot, Sharon Odom participated in an initial evaluation and trial of Bioness functional e-stim to see if she would benefit from its use.  Following that evaluation [name] did want to pursue getting the Bioness through insurance and through the financial assistance of the MS Society.  I did not certify her for more visits at that time because is often a lengthy process.    She has since decided to go ahead and purchase the Bioness out of pocket and will be receiving the unit this week.  In order to certify her for therapy visits, we will need a new PT/Bioness eval and treat order entered into Epic before she returns this week with her Bioness unit.  Thank you, Dierdre Highman, PT, DPT 02/02/18    9:21 AM

## 2018-02-03 ENCOUNTER — Other Ambulatory Visit: Payer: Self-pay | Admitting: Neurology

## 2018-02-03 DIAGNOSIS — G35 Multiple sclerosis: Secondary | ICD-10-CM

## 2018-02-03 DIAGNOSIS — G8220 Paraplegia, unspecified: Secondary | ICD-10-CM

## 2018-02-05 ENCOUNTER — Ambulatory Visit (INDEPENDENT_AMBULATORY_CARE_PROVIDER_SITE_OTHER): Payer: BLUE CROSS/BLUE SHIELD

## 2018-02-05 DIAGNOSIS — G35 Multiple sclerosis: Secondary | ICD-10-CM | POA: Diagnosis not present

## 2018-02-05 MED ORDER — GADOBENATE DIMEGLUMINE 529 MG/ML IV SOLN
20.0000 mL | Freq: Once | INTRAVENOUS | Status: AC | PRN
  Administered 2018-02-05: 20 mL via INTRAVENOUS

## 2018-02-06 ENCOUNTER — Encounter: Payer: Self-pay | Admitting: Neurology

## 2018-02-07 ENCOUNTER — Telehealth: Payer: Self-pay | Admitting: *Deleted

## 2018-02-07 ENCOUNTER — Ambulatory Visit: Payer: BLUE CROSS/BLUE SHIELD | Attending: Internal Medicine | Admitting: Physical Therapy

## 2018-02-07 DIAGNOSIS — M21372 Foot drop, left foot: Secondary | ICD-10-CM | POA: Insufficient documentation

## 2018-02-07 DIAGNOSIS — G8114 Spastic hemiplegia affecting left nondominant side: Secondary | ICD-10-CM | POA: Diagnosis not present

## 2018-02-07 DIAGNOSIS — R2689 Other abnormalities of gait and mobility: Secondary | ICD-10-CM

## 2018-02-07 NOTE — Telephone Encounter (Signed)
-----   Message from Asa Lente, MD sent at 02/06/2018  7:04 PM EDT ----- Please let her know that the MRI of the brain and spine did not show any new lesions.

## 2018-02-07 NOTE — Telephone Encounter (Signed)
Spoke with Sharon Odom and reviewed below MRI results. She verbalized understanding of same/fim

## 2018-02-07 NOTE — Telephone Encounter (Signed)
-----   Message from Richard A Sater, MD sent at 02/06/2018  7:04 PM EDT ----- Please let her know that the MRI of the brain and spine did not show any new lesions. 

## 2018-02-08 ENCOUNTER — Encounter: Payer: Self-pay | Admitting: Physical Therapy

## 2018-02-08 NOTE — Patient Instructions (Signed)
Weight Shift: Diagonal    Slowly shift weight forward over left leg. Shift backward over right leg with left toes lifted while Bioness is stimulating. Hold each position __5__ seconds. Repeat __10__ times per session. Do __2__ sessions per day.   FUNCTIONAL MOBILITY: Marching - Standing    Stand holding the back of a chair or counter top.  When the Bioness is stimulating lift your left knee up towards the back of the chair or cabinet.   _10__ reps per set, __2_ sets per day

## 2018-02-08 NOTE — Therapy (Signed)
[facility] [address] [place], [place], [address] Phone: [phone]   Fax:  [phone]  Physical Therapy Evaluation and Treatment  Patient Details  Name: Sharon Odom MRN: [id number] Date of Birth: Apr 06, 1971 Referring Provider: Asa Lente, MD   Encounter Date: 02/07/2018  PT End of Session - 02/08/18 1320    Visit Number  1    Number of Visits  7    Date for PT Re-Evaluation  03/25/18    Authorization Type  BCBS out of state    PT Start Time  1530    PT Stop Time  1645    PT Time Calculation (min)  75 min    Equipment Utilized During Treatment  Other (comment)   Bioness   Activity Tolerance  Patient tolerated treatment well    Behavior During Therapy  [facility] for tasks assessed/performed       Past Medical History:  Diagnosis Date  . Multiple sclerosis (HCC)   . Vision abnormalities     Past Surgical History:  Procedure Laterality Date  . EXCISION MORTON'S NEUROMA Right     There were no vitals filed for this visit.   Subjective Assessment - 02/08/18 1259    Subjective  Pt diagnosed with MS 6 years with L hemiparesis.  Pt previously lived in [facility] and was relatively stable.  When she moved to [place] she began to have more L sided weakness and L foot drop.  Pt previously evaluated for use of Bioness functional electrical stimulation.  Pt returns with her purchased, home Bioness unit and is ready to begin using at home.    Patient is accompained by:  Family member    Pertinent History  Has used a walk aide on RLE. MS x 6 years.  Diplopia.  Anxiety and Depression    Limitations  Standing;Walking    How long can you stand comfortably?  5-10 minutes    How long can you walk comfortably?  household distances    Patient Stated Goals  To obtain a functional electrical stimulation unit to assist with foot drop during gait.  Pt is unwilling to wear an AFO    Currently in Pain?  No/denies         [facility] PT  Assessment - 02/07/18 1700      Assessment   Medical Diagnosis  MS - L foot drop    Referring Provider  Asa Lente, MD    Onset Date/Surgical Date  --   diagnosed in 2013   Prior Therapy  in [facility] for MS; evaluated for R Walk Aide      Precautions   Precautions  Fall    Precaution Comments  MS x 6 years, diplopia, spasticity, anxiety/depression      Balance Screen   Has the patient fallen in the past 6 months  No    Has the patient had a decrease in activity level because of a fear of falling?   Yes    Is the patient reluctant to leave their home because of a fear of falling?   Yes      Home Environment   Living Environment  Private residence    Living Arrangements  Spouse/significant other    Type of Home  House    Home Access  Stairs to enter    Entrance Stairs-Number of Steps  3-4    Entrance Stairs-Rails  Right;Left    Home Layout  Two level;Able to live on main  level with bedroom/bathroom    Home Equipment  Walker - 2 wheels;Cane - single point;Electric scooter;Wheelchair - manual      Prior Function   Level of Independence  Independent with household mobility with device;Requires assistive device for independence   RW for household distances; w/c or scooter for community     Sensation   Light Touch  Impaired Detail    Light Touch Impaired Details  Impaired LLE    Proprioception  Appears Intact      Tone   Assessment Location  Left Lower Extremity      ROM / Strength   AROM / PROM / Strength  Strength      Strength   Overall Strength  Deficits    Overall Strength Comments  RLE: WFL.  LLE: 1/5 hip flexion, knee extension 3/5, knee flexion 3/5, ankle DF 3/5 but delayed      Ambulation/Gait   Ambulation/Gait  Yes    Ambulation/Gait Assistance  5: Supervision;4: Min assist    Ambulation/Gait Assistance Details  Performed initial set up of Bioness home unit with therapist making adjustments to gait settings and intensity settings.  Also performed gait short  distances with patient's cane with min A.  Pt cued for increased L hip and knee flexion during swing phase.  Pt noted to have increased foot drag with longer distances and fatigue    Ambulation Distance (Feet)  115 Feet    Assistive device  Rollator    Gait Pattern  Decreased step length - left;Decreased stance time - left;Decreased stride length;Decreased hip/knee flexion - left;Decreased dorsiflexion - left;Left genu recurvatum;Poor foot clearance - left;Step-through pattern    Ambulation Surface  Level;Indoor    Stairs  --    [employer]  --    Stair Management Technique  --    Number of Stairs  --    Height of Stairs  --      LLE Tone   LLE Tone  Modified Ashworth      LLE Tone   Modified Ashworth Scale for Grading Hypertonia LLE  Slight increase in muscle tone, manifested by a catch, followed by minimal resistance throughout the remainder (less than half) of the ROM                Objective measurements completed on examination: See above findings.      [facility] Adult PT Treatment/Exercise - 02/07/18 1700      Therapeutic Activites    Therapeutic Activities  Other Therapeutic Activities    Other Therapeutic Activities  initiated Bioness home education.  Reviewed with patient and husband: set up of each cuff, care of electrodes, changing electrodes every 2 weeks, skin care/checks, donning/doffing, powering on/off and pairing cuffs to phone app, wear schedule, training schedule and initial HEP      Exercises   Exercises  Knee/Hip      Knee/Hip Exercises: Standing   Hip Flexion  Stengthening;Left;1 set;10 reps;Knee bent   5 seconds on, 10 seconds off   Other Standing Knee Exercises  staggered stance with left foot forwards performing ant/post weight shifting ankle DF 5 seconds on, 10 seconds off x 10 reps with bilat UE support      Modalities   Modalities  Electrical Stimulation      Electrical Stimulation   Electrical Stimulation Location  L anterior tibialis  and L hamstring    Electrical Stimulation Action  closed and open chain ankle DF, knee flexion and hip extension    Electrical  Stimulation Parameters  Parameters saved in Tablet 1; quick fit electrodes.  Transitioned over to patient's phone app    Electrical Stimulation Goals  Strength;Tone;Pain;Neuromuscular facilitation             PT Education - 02/08/18 1320    Education Details  See TA; HEP    Person(s) Educated  Patient;Spouse    Methods  Explanation;Demonstration;Handout    Comprehension  Verbalized understanding;Returned demonstration          PT Long Term Goals - 02/08/18 1333      PT LONG TERM GOAL #1   Title  Pt will demonstrate independence with set up and donning of LLE upper and lower Bioness cuffs, pairing to phone and use of app controls    Time  6    Period  Weeks    Status  New    Target Date  03/25/18      PT LONG TERM GOAL #2   Title  Pt will demonstrate independence with skin checks and care of Bioness electrodes (drying, changing every 2 weeks)    Time  6    Period  Weeks    Status  New    Target Date  03/25/18      PT LONG TERM GOAL #3   Title  Pt will demonstrate independence with Bioness training HEP    Time  6    Period  Weeks    Status  New    Target Date  03/25/18      PT LONG TERM GOAL #4   Title  Pt will progress to wearing Bioness 8 hours/day in gait mode with 15 min, 2x/day in training mode    Time  6    Period  Weeks    Status  New    Target Date  03/25/18      PT LONG TERM GOAL #5   Title  Pt will improve functional gait velocity with Bioness and LRAD to >/= 1.8 ft/sec     Baseline  TBD    Time  6    Period  Weeks    Status  New    Target Date  03/25/18      Additional Long Term Goals   Additional Long Term Goals  Yes      PT LONG TERM GOAL #6   Title  Pt will ambulate x 115' over indoor surfaces with LRAD/Bioness and negotiate 4 stairs with 2 rails MOD I    Time  6    Period  Weeks    Status  New    Target Date   03/25/18             Plan - 02/08/18 1322    Clinical Impression Statement  Patient returns with purchased home Bioness unit for initial fitting, set up and training.  Pt's husband present to observe all education.  Initiated wear schedule and training schedule with pt to return next week for further assessment and training.  Pt will benefit from skilled PT services to monitor patient compliance with wear and training schedule and tolerance to home Bioness unit as well as ongoing gait training and functional LLE strengthening and motor planning in order to maximize functional mobility independence and decrease risk for falls.    History and Personal Factors relevant to plan of care:  Just purchased Bioness home unit, MS x 6 years, had good results with use of Walk Aide in [facility] for R foot drop; diplopia, spasticity, anxiety/depression, refuses  to wear AFO    Clinical Presentation  Stable    Clinical Presentation due to:  Just purchased Bioness home unit, MS x 6 years, had good results with use of Walk Aide in [facility] for R foot drop; diplopia, spasticity, anxiety/depression, refuses to wear AFO    Clinical Decision Making  Low    Rehab Potential  Good    PT Frequency  1x / week    PT Duration  6 weeks   Bioness eval only   PT Treatment/Interventions  Electrical Stimulation;ADLs/Self Care Home Management;DME Instruction;Gait training;Stair training;Functional mobility training;Therapeutic activities;Therapeutic exercise;Balance training;Neuromuscular re-education;Patient/family education;Passive range of motion;Energy conservation    PT Next Visit Plan  Assess gait velocity, check wear schedule/skin, carry over of techniques for donning.  Add to HEP, update wear schedule.  Gait and stair train with home Bioness    Consulted and Agree with Plan of Care  Patient;Family member/caregiver    Family Member Consulted  Husband - Theodoro Grist       Patient will benefit from skilled therapeutic intervention in order  to improve the following deficits and impairments:  Abnormal gait, Decreased balance, Decreased endurance, Decreased mobility, Decreased strength, Difficulty walking, Impaired sensation, Pain  Visit Diagnosis: Spastic hemiplegia of left nondominant side due to noncerebrovascular etiology (HCC)  Foot drop, left  Other abnormalities of gait and mobility     Problem List Patient Active Problem List   Diagnosis Date Noted  . Spastic diplegia, acquired, lower extremity (HCC) 10/16/2017  . Muscle spasticity 08/15/2017  . Gait disturbance 08/15/2017  . Depression with anxiety 08/15/2017  . Diplopia 08/15/2017  . Multiple sclerosis (HCC) 03/04/2017  . High risk medication use 02/06/2017    Dierdre Highman, PT, DPT 02/08/18    1:46 PM    [facility] [address] [place], [place], [address] Phone: [phone]   Fax:  [phone]  Name: Sharon Odom MRN: [id number] Date of Birth: 05-Jul-1970

## 2018-02-14 ENCOUNTER — Other Ambulatory Visit: Payer: Self-pay | Admitting: *Deleted

## 2018-02-14 ENCOUNTER — Ambulatory Visit: Payer: BLUE CROSS/BLUE SHIELD | Admitting: Physical Therapy

## 2018-02-14 ENCOUNTER — Encounter: Payer: Self-pay | Admitting: Physical Therapy

## 2018-02-14 DIAGNOSIS — G8114 Spastic hemiplegia affecting left nondominant side: Secondary | ICD-10-CM | POA: Diagnosis not present

## 2018-02-14 DIAGNOSIS — R2689 Other abnormalities of gait and mobility: Secondary | ICD-10-CM | POA: Diagnosis not present

## 2018-02-14 DIAGNOSIS — M21372 Foot drop, left foot: Secondary | ICD-10-CM | POA: Diagnosis not present

## 2018-02-14 MED ORDER — SULFAMETHOXAZOLE-TRIMETHOPRIM 800-160 MG PO TABS: 1.0000 | ORAL_TABLET | Freq: Two times a day (BID) | ORAL | 0 refills | Status: DC

## 2018-02-14 NOTE — Patient Instructions (Signed)
Gastroc, Sitting (Passive) with your leg lifter over your foot    Sit with strap or towel around ball of foot. When the stimulation is on: gently pull toward body. Hold __5_ seconds.  Repeat _6-8__ times per session. Take a break if it spasms.  Do _2__ sessions per day.   Extension: Isometric Bridging (Supine)    Lie with legs bent, feet flat on bed.  When the leg is stimulated, lift toes and press heels down into bed.   Hold _5__ seconds. Relax. Repeat __6-8_ times. Do _2__ sessions per day.   Bracing With Heel Slides  SIT IN A CHAIR WITH YOUR LEFT FOOT ON A SMOOTH FLOOR - SHOE OFF    WHEN THE LEG IS BEING STIMULATED, slide heel BACK.  HOLD 5 SECONDS. KICK IT OUT STRAIGHT WHEN STIMULATION IS OFF. Repeat _6-8__ times. Do _2__ times a day.     Marching    SWITCH THE THIGH CUFF TO THE FRONT OF YOUR THIGH.  WHEN THE LEG IS STIMULATED, LIFT THE LEFT LEG, as if marching.  HOLD 3-4 SECONDS.  Repeat _6-8__ times. Do _2__ sessions per day.    Weight Shift: Diagonal    Slowly shift weight forward over left leg. Shift backward over right leg with left toes lifted while Bioness is stimulating. Hold each position __5__ seconds. Repeat __10__ times per session. Do __2__ sessions per day.

## 2018-02-16 NOTE — Therapy (Signed)
[facility] [address] [place], [place], [address] Phone: [phone]   Fax:  [phone]  Physical Therapy Treatment  Patient Details  Name: Sharon Odom MRN: [id number] Date of Birth: 1971/04/18 Referring Provider: Asa Lente, MD   Encounter Date: 02/14/2018  PT End of Session - 02/16/18 1432    Visit Number  2    Number of Visits  7    Date for PT Re-Evaluation  03/25/18    Authorization Type  BCBS out of state    PT Start Time  1450    PT Stop Time  1543    PT Time Calculation (min)  53 min    Equipment Utilized During Treatment  Other (comment)   Bioness   Activity Tolerance  Patient limited by fatigue;Treatment limited secondary to medical complications (Comment)   bacterial infection   Behavior During Therapy  [facility] for tasks assessed/performed       Past Medical History:  Diagnosis Date  . Multiple sclerosis (HCC)   . Vision abnormalities     Past Surgical History:  Procedure Laterality Date  . EXCISION MORTON'S NEUROMA Right     There were no vitals filed for this visit.     02/14/18 1457  Symptoms/Limitations  Subjective Pt has bacterial infection (UTI or kidney) but is very fatigued and has been unable to ambulate 2x/day with the Bioness and only able to do exercises 1x/day.  Sometimes after walking with it she can't walk for 2 hours due to fatigue.  Having a hard time keeping the thigh cuff in place and has to re-pair them every few days.  Patient is accompained by: Family member  Pertinent History Has used a walk aide on RLE. MS x 6 years.  Diplopia.  Anxiety and Depression  Limitations Standing;Walking  How long can you stand comfortably? 5-10 minutes  How long can you walk comfortably? household distances  Patient Stated Goals To obtain a functional electrical stimulation unit to assist with foot drop during gait.  Pt is unwilling to wear an AFO  Pain Assessment  Currently in Pain?  Yes  Pain Score 5  Pain Location Generalized  Pain Descriptors / Indicators Aching                     [facility] Adult PT Treatment/Exercise - 02/16/18 1420      Therapeutic Activites    Therapeutic Activities  Other Therapeutic Activities    Other Therapeutic Activities  Due to current infection decreased activity tolerance with increased weakness following ambulation and exercise training and decreased tolerance to Bioness stimulation advised pt to hold on progressing wear schedule and to only wear one time a day stimulating for 15 minutes during ambulation and one time a day for training.  Also educated pt on supine and sitting HEP to tolerance.  Educated on other ways to don thigh cuff to improve positioning of cuff to prevent it from sliding down her leg - attempted to stand and place cuff high on posterior thigh (under gluteal fold) and then sit on cuff to hold it in place while hooking straps.  Currently unable to balance and perform this method.  Will continue to problem solve more efficient donning techniques      Exercises   Exercises  Knee/Hip      Knee/Hip Exercises: Stretches   Gastroc Stretch  Left;30 seconds;2 reps    Gastroc Stretch Limitations  with leg lifter  Knee/Hip Exercises: Seated   Heel Slides  Strengthening;Left;1 set;5 reps    Heel Slides Limitations  with Bioness in training mode    Marching  Left;1 set;5 reps    Marching Limitations  Bioness in training mode, leaning back to improve length-tension relationship for activation      Knee/Hip Exercises: Supine   Heel Slides  Strengthening;Left;1 set    Heel Slides Limitations  unable with Bioness and use of strap for active assisted; transitioned to seated heel slides    Bridges  Strengthening;Both;1 set;5 reps    Bridges Limitations  with Bioness in training mode; toe lift with isometric activation of hamstring and glutes      Modalities   Modalities  Electrical Stimulation      Electrical  Stimulation   Electrical Stimulation Location  L anterior tibialis and L hamstring; also switched to L quad when performing seated marching    Electrical Stimulation Action  closed and open chain ankle DF, hip extension and knee flexion.  Open chain hip flexion during marching    Electrical Stimulation Parameters  use of patient's phone app to control Bioness unit    Electrical Stimulation Goals  Strength;Tone;Pain;Neuromuscular facilitation             PT Education - 02/16/18 1432    Education Details  removed standing marching from HEP; added supine and seated HEP; Bioness wear schedule education    Person(s) Educated  Patient;Spouse    Methods  Explanation;Demonstration    Comprehension  Verbalized understanding;Returned demonstration         Gastroc, Sitting (Passive) with your leg lifter over your foot    Sit with strap or towel around ball of foot. When the stimulation is on: gently pull toward body. Hold __5_ seconds.  Repeat _6-8__ times per session. Take a break if it spasms.  Do _2__ sessions per day.   Extension: Isometric Bridging (Supine)    Lie with legs bent, feet flat on bed.  When the leg is stimulated, lift toes and press heels down into bed.   Hold _5__ seconds. Relax. Repeat __6-8_ times. Do _2__ sessions per day.    Bracing With Heel Slides  SIT IN A CHAIR WITH YOUR LEFT FOOT ON A SMOOTH FLOOR - SHOE OFF    WHEN THE LEG IS BEING STIMULATED, slide heel BACK.  HOLD 5 SECONDS. KICK IT OUT STRAIGHT WHEN STIMULATION IS OFF. Repeat _6-8__ times. Do _2__ times a day.   Marching    SWITCH THE THIGH CUFF TO THE FRONT OF YOUR THIGH.  WHEN THE LEG IS STIMULATED, LIFT THE LEFT LEG, as if marching.  HOLD 3-4 SECONDS.  Repeat _6-8__ times. Do _2__ sessions per day.   PT Long Term Goals - 02/08/18 1333      PT LONG TERM GOAL #1   Title  Pt will demonstrate independence with set up and donning of LLE upper and lower Bioness cuffs, pairing to phone and use of  app controls    Time  6    Period  Weeks    Status  New    Target Date  03/25/18      PT LONG TERM GOAL #2   Title  Pt will demonstrate independence with skin checks and care of Bioness electrodes (drying, changing every 2 weeks)    Time  6    Period  Weeks    Status  New    Target Date  03/25/18      PT  LONG TERM GOAL #3   Title  Pt will demonstrate independence with Bioness training HEP    Time  6    Period  Weeks    Status  New    Target Date  03/25/18      PT LONG TERM GOAL #4   Title  Pt will progress to wearing Bioness 8 hours/day in gait mode with 15 min, 2x/day in training mode    Time  6    Period  Weeks    Status  New    Target Date  03/25/18      PT LONG TERM GOAL #5   Title  Pt will improve functional gait velocity with Bioness and LRAD to >/= 1.8 ft/sec     Baseline  TBD    Time  6    Period  Weeks    Status  New    Target Date  03/25/18      Additional Long Term Goals   Additional Long Term Goals  Yes      PT LONG TERM GOAL #6   Title  Pt will ambulate x 115' over indoor surfaces with LRAD/Bioness and negotiate 4 stairs with 2 rails MOD I    Time  6    Period  Weeks    Status  New    Target Date  03/25/18            Plan - 02/16/18 1433    Clinical Impression Statement  Pt demonstrates limited ability to progress with wear schedule, ambulation and training time due to bacterial infection - pt to start antibiotic today - advised pt to wait 2-3 days before progressing wear schedule, ambulation and training time again.  Adjusted HEP to include more supine and seated exercises with decreased repetitions due to limited activity tolerance today.  Will continue to progress as pt is able to tolerate.    Rehab Potential  Good    PT Frequency  1x / week    PT Duration  6 weeks   Bioness eval only   PT Treatment/Interventions  Electrical Stimulation;ADLs/Self Care Home Management;DME Instruction;Gait training;Stair training;Functional mobility  training;Therapeutic activities;Therapeutic exercise;Balance training;Neuromuscular re-education;Patient/family education;Passive range of motion;Energy conservation    PT Next Visit Plan  Has she been able to progress wear schedule?  Add to HEP.  Assess gait velocity, check wear schedule/skin, carry over of techniques for donning.  Add to HEP, update wear schedule.  Gait and stair train with home Bioness    Consulted and Agree with Plan of Care  Patient;Family member/caregiver    Family Member Consulted  Husband - Theodoro Grist       Patient will benefit from skilled therapeutic intervention in order to improve the following deficits and impairments:  Abnormal gait, Decreased balance, Decreased endurance, Decreased mobility, Decreased strength, Difficulty walking, Impaired sensation, Pain  Visit Diagnosis: Spastic hemiplegia of left nondominant side due to noncerebrovascular etiology (HCC)  Foot drop, left  Other abnormalities of gait and mobility     Problem List Patient Active Problem List   Diagnosis Date Noted  . Spastic diplegia, acquired, lower extremity (HCC) 10/16/2017  . Muscle spasticity 08/15/2017  . Gait disturbance 08/15/2017  . Depression with anxiety 08/15/2017  . Diplopia 08/15/2017  . Multiple sclerosis (HCC) 03/04/2017  . High risk medication use 02/06/2017    Dierdre Highman, PT, DPT 02/16/18    2:41 PM    [facility] [address] [place], [place], [address]  Phone: [phone]   Fax:  [phone]  Name: Sharon Odom MRN: [id number] Date of Birth: 06/11/1970

## 2018-02-20 ENCOUNTER — Ambulatory Visit: Payer: BLUE CROSS/BLUE SHIELD | Admitting: Physical Therapy

## 2018-02-20 ENCOUNTER — Encounter: Payer: Self-pay | Admitting: Physical Therapy

## 2018-02-20 DIAGNOSIS — G8114 Spastic hemiplegia affecting left nondominant side: Secondary | ICD-10-CM | POA: Diagnosis not present

## 2018-02-20 DIAGNOSIS — R2689 Other abnormalities of gait and mobility: Secondary | ICD-10-CM

## 2018-02-20 DIAGNOSIS — M21372 Foot drop, left foot: Secondary | ICD-10-CM | POA: Diagnosis not present

## 2018-02-20 NOTE — Therapy (Signed)
[facility] [address] [place], [place], [address] Phone: [phone]   Fax:  [phone]  Physical Therapy Treatment  Patient Details  Name: Sharon Odom MRN: [id number] Date of Birth: [birth year] Referring Provider (PT): Asa Lente, MD   Encounter Date: 02/20/2018  PT End of Session - 02/20/18 1742    Visit Number  3    Number of Visits  7    Date for PT Re-Evaluation  03/25/18    Authorization Type  BCBS out of [place]    PT Start Time  1405    PT Stop Time  1450    PT Time Calculation (min)  45 min    Equipment Utilized During Treatment  Other (comment)   Bioness   Activity Tolerance  Patient limited by fatigue    Behavior During Therapy  [facility] for tasks assessed/performed       Past Medical History:  Diagnosis Date  . Multiple sclerosis (HCC)   . Vision abnormalities     Past Surgical History:  Procedure Laterality Date  . EXCISION MORTON'S NEUROMA Right     There were no vitals filed for this visit.  Subjective Assessment - 02/20/18 1408    Subjective  Feeling better but due to feeling better pt has been up on her legs a lot today.  LE are fatigued and had spasms in UE and LE.   Is still wearing it one time a day but plans to increase up to 2x/day.      Patient is accompained by:  Family member    Pertinent History  Has used a walk aide on RLE. MS x 6 years.  Diplopia.  Anxiety and Depression    Limitations  Standing;Walking    How long can you stand comfortably?  5-10 minutes    How long can you walk comfortably?  household distances    Patient Stated Goals  To obtain a functional electrical stimulation unit to assist with foot drop during gait.  Pt is unwilling to wear an AFO    Currently in Pain?  No/denies                       [facility] Adult PT Treatment/Exercise - 02/20/18 1415      Ambulation/Gait   Ambulation/Gait  Yes    Ambulation/Gait Assistance  4: Min assist    Ambulation/Gait Assistance Details  With Bioness: pt noted to have decreased genu recurvatum in stance.  Provided tactile cues and facilitation at trunk for weight shift to R (instead of trunk lean to R) and activation of hip and knee flexion during swing instead of L hip hike    Ambulation Distance (Feet)  230 Feet    Assistive device  Rollator    Gait Pattern  Left hip hike;Lateral trunk lean to right;Decreased weight shift to right;Decreased hip/knee flexion - right    Ambulation Surface  Level;Indoor      Neuro Re-ed    Neuro Re-ed Details   NMR at counter top: focus on reaching with RUE up and to the R sliding hand along cabinet door to facilitate R trunk elongation and weight shifting to fully unweight LLE to facilitate increased hip and knee flexion activation during swing phase.  Performed 3 sets x 5-8 reps with seated rest breaks in between.  Therapist provided support on R side for balance and verbal cues to sequence with Bioness in training mode       Modalities  Modalities  Primary school teacher Stimulation Location  L anterior tibialis and L hamstring    Electrical Stimulation Action  closed and open chain ankle DF, hip extension and knee flexion.  Open chain hip flexion during marching    Electrical Stimulation Parameters  use of patient's phone app to control Bioness unit    Electrical Stimulation Goals  Strength;Tone;Pain;Neuromuscular facilitation             PT Education - 02/20/18 1741    Education Details  continue to follow wear schedule to slowly improve tolerance to stimulation and prevent over-fatigue of muscles.      Person(s) Educated  Patient;Spouse    Methods  Explanation;Demonstration    Comprehension  Verbalized understanding          PT Long Term Goals - 02/08/18 1333      PT LONG TERM GOAL #1   Title  Pt will demonstrate independence with set up and donning of LLE upper and lower Bioness cuffs, pairing  to phone and use of app controls    Time  6    Period  Weeks    Status  New    Target Date  03/25/18      PT LONG TERM GOAL #2   Title  Pt will demonstrate independence with skin checks and care of Bioness electrodes (drying, changing every 2 weeks)    Time  6    Period  Weeks    Status  New    Target Date  03/25/18      PT LONG TERM GOAL #3   Title  Pt will demonstrate independence with Bioness training HEP    Time  6    Period  Weeks    Status  New    Target Date  03/25/18      PT LONG TERM GOAL #4   Title  Pt will progress to wearing Bioness 8 hours/day in gait mode with 15 min, 2x/day in training mode    Time  6    Period  Weeks    Status  New    Target Date  03/25/18      PT LONG TERM GOAL #5   Title  Pt will improve functional gait velocity with Bioness and LRAD to >/= 1.8 ft/sec     Baseline  TBD    Time  6    Period  Weeks    Status  New    Target Date  03/25/18      Additional Long Term Goals   Additional Long Term Goals  Yes      PT LONG TERM GOAL #6   Title  Pt will ambulate x 115' over indoor surfaces with LRAD/Bioness and negotiate 4 stairs with 2 rails MOD I    Time  6    Period  Weeks    Status  New    Target Date  03/25/18            Plan - 02/20/18 1743    Clinical Impression Statement  Pt demonstrated improvement in activity tolerance today and was able to participate in gait training with rollator, standing balance and NMR training for weight shifting and LLE activation training with Bioness.  Continued to reinforce importance of following wear schedule to build up tolerance and prevent overuse fatigue.  Pt verbalized understanding.  Will continue to progress towards LTG.    Rehab Potential  Good  PT Frequency  1x / week    PT Duration  6 weeks   Bioness eval only   PT Treatment/Interventions  Electrical Stimulation;ADLs/Self Care Home Management;DME Instruction;Gait training;Stair training;Functional mobility training;Therapeutic  activities;Therapeutic exercise;Balance training;Neuromuscular re-education;Patient/family education;Passive range of motion;Energy conservation    PT Next Visit Plan  assess gait velocity; check wear schedule/skin, carry over of techniques for donning.  Add to HEP, update wear schedule.  Gait and stair train with home Bioness    Consulted and Agree with Plan of Care  Patient;Family member/caregiver    Family Member Consulted  Husband - Theodoro Grist       Patient will benefit from skilled therapeutic intervention in order to improve the following deficits and impairments:  Abnormal gait, Decreased balance, Decreased endurance, Decreased mobility, Decreased strength, Difficulty walking, Impaired sensation, Pain  Visit Diagnosis: Spastic hemiplegia of left nondominant side due to noncerebrovascular etiology (HCC)  Foot drop, left  Other abnormalities of gait and mobility     Problem List Patient Active Problem List   Diagnosis Date Noted  . Spastic diplegia, acquired, lower extremity (HCC) 10/16/2017  . Muscle spasticity 08/15/2017  . Gait disturbance 08/15/2017  . Depression with anxiety 08/15/2017  . Diplopia 08/15/2017  . Multiple sclerosis (HCC) 03/04/2017  . High risk medication use 02/06/2017    Dierdre Highman, PT, DPT 02/20/18    5:50 PM    [facility] [address] [place], [place], [address] Phone: [phone]   Fax:  [phone]  Name: Marye Dicostanzo MRN: [id number] Date of Birth: 1971-01-14

## 2018-02-24 MED ORDER — VALACYCLOVIR HCL 500 MG PO TABS: 500.0000 mg | ORAL_TABLET | Freq: Two times a day (BID) | ORAL | 0 refills | Status: DC

## 2018-02-24 MED ORDER — ONDANSETRON HCL 8 MG PO TABS: ORAL_TABLET | ORAL | 1 refills | Status: DC

## 2018-02-26 ENCOUNTER — Encounter: Payer: Self-pay | Admitting: Physical Therapy

## 2018-02-26 ENCOUNTER — Ambulatory Visit: Payer: BLUE CROSS/BLUE SHIELD | Attending: Internal Medicine | Admitting: Physical Therapy

## 2018-02-26 DIAGNOSIS — M21372 Foot drop, left foot: Secondary | ICD-10-CM | POA: Diagnosis not present

## 2018-02-26 DIAGNOSIS — R2689 Other abnormalities of gait and mobility: Secondary | ICD-10-CM | POA: Insufficient documentation

## 2018-02-26 DIAGNOSIS — G8114 Spastic hemiplegia affecting left nondominant side: Secondary | ICD-10-CM

## 2018-02-26 NOTE — Therapy (Signed)
[facility] [address] [place], [place], [address] Phone: [phone]   Fax:  [phone]  Physical Therapy Treatment  Patient Details  Name: Sharon Odom MRN: [id number] Date of Birth: 04/29/1971 Referring Provider (PT): Asa Lente, MD   Encounter Date: 02/26/2018  PT End of Session - 02/26/18 1646    Visit Number  4    Number of Visits  7    Date for PT Re-Evaluation  03/25/18    Authorization Type  BCBS out of state    PT Start Time  1537    PT Stop Time  1617    PT Time Calculation (min)  40 min    Equipment Utilized During Treatment  Other (comment)   Bioness   Activity Tolerance  Patient limited by fatigue    Behavior During Therapy  [facility] for tasks assessed/performed       Past Medical History:  Diagnosis Date  . Multiple sclerosis (HCC)   . Vision abnormalities     Past Surgical History:  Procedure Laterality Date  . EXCISION MORTON'S NEUROMA Right     There were no vitals filed for this visit.  Subjective Assessment - 02/26/18 1538    Subjective  Had to turn down the intensity this week; still having a hard time moving the leg due to spasticity.  Still only able to tolerate wearing it 2x/day, 2 hours at a time.  Feels like she does better with the exercises without the stimulation.    Patient is accompained by:  Family member    Pertinent History  Has used a walk aide on RLE. MS x 6 years.  Diplopia.  Anxiety and Depression    Limitations  Standing;Walking    How long can you stand comfortably?  5-10 minutes    How long can you walk comfortably?  household distances    Patient Stated Goals  To obtain a functional electrical stimulation unit to assist with foot drop during gait.  Pt is unwilling to wear an AFO    Currently in Pain?  No/denies         [facility] PT Assessment - 02/26/18 1649      Ambulation/Gait   Ambulation/Gait  Yes    Ambulation/Gait Assistance  5: Supervision    Ambulation/Gait Assistance Details  after adjusting parameters and intensity on upper and lower cuff; pt demonstrated improved foot clearance and knee flexion during swing phase, decreased genu recurvatum and improved tolerance of stimulation today    Ambulation Distance (Feet)  115 Feet    Assistive device  Rollator    Gait Pattern  Left hip hike;Lateral trunk lean to right;Decreased hip/knee flexion - right    Ambulation Surface  Level;Indoor      Standardized Balance Assessment   Standardized Balance Assessment  10 meter walk test    10 Meter Walk  With rollator.  With Bioness: 1.27 seconds or .37 ft/sec.  Without Bioness 1.36 seconds or .34 ft/sec                   [facility] Adult PT Treatment/Exercise - 02/26/18 1647      Modalities   Modalities  Electrical Stimulation      Electrical Stimulation   Electrical Stimulation Location  L anterior tibialis and L hamstring -repositioned upper cuff to more midline on thigh    Electrical Stimulation Action  closed and open chain ankle DF, hip extension and knee flexion.  Open chain hip flexion during marching  Electrical Stimulation Parameters  use of clinic control unit to adjust parameters and intensity of upper and lower cuffs; pt reported improved comfort and tolerance to settings during gait    Electrical Stimulation Goals  Strength;Tone;Pain;Neuromuscular facilitation             PT Education - 02/26/18 1646    Education Details  continued to discuss wear schedule vs. stimulation time, adjusted parameters, plan for final session next week, gait velocity with/without Bioness    Person(s) Educated  Patient;Spouse    Methods  Explanation    Comprehension  Verbalized understanding          PT Long Term Goals - 02/08/18 1333      PT LONG TERM GOAL #1   Title  Pt will demonstrate independence with set up and donning of LLE upper and lower Bioness cuffs, pairing to phone and use of app controls    Time  6    Period   Weeks    Status  New    Target Date  03/25/18      PT LONG TERM GOAL #2   Title  Pt will demonstrate independence with skin checks and care of Bioness electrodes (drying, changing every 2 weeks)    Time  6    Period  Weeks    Status  New    Target Date  03/25/18      PT LONG TERM GOAL #3   Title  Pt will demonstrate independence with Bioness training HEP    Time  6    Period  Weeks    Status  New    Target Date  03/25/18      PT LONG TERM GOAL #4   Title  Pt will progress to wearing Bioness 8 hours/day in gait mode with 15 min, 2x/day in training mode    Time  6    Period  Weeks    Status  New    Target Date  03/25/18      PT LONG TERM GOAL #5   Title  Pt will improve functional gait velocity with Bioness and LRAD to >/= 1.8 ft/sec     Baseline  TBD    Time  6    Period  Weeks    Status  New    Target Date  03/25/18      Additional Long Term Goals   Additional Long Term Goals  Yes      PT LONG TERM GOAL #6   Title  Pt will ambulate x 115' over indoor surfaces with LRAD/Bioness and negotiate 4 stairs with 2 rails MOD I    Time  6    Period  Weeks    Status  New    Target Date  03/25/18            Plan - 02/26/18 1651    Clinical Impression Statement  Treatment session focused on revision of Bioness parameters and intensity to improve tolerance for ambulation and decrease spasticity/pain.  Pt demonstrated improved gait and tolerance with revisions.  Performed 10 meter walk test with rollator with and without Bioness on with 10 second difference and increased foot drag without Bioness.  Continued to discuss progression of wear schedule and stimulation time to patient's tolerance.  Will continue to review at final session next week.    Rehab Potential  Good    PT Frequency  1x / week    PT Duration  6 weeks   Bioness eval  only   PT Treatment/Interventions  Electrical Stimulation;ADLs/Self Care Home Management;DME Instruction;Gait training;Stair training;Functional  mobility training;Therapeutic activities;Therapeutic exercise;Balance training;Neuromuscular re-education;Patient/family education;Passive range of motion;Energy conservation    PT Next Visit Plan  check goals, finalize HEP and wear schedule.  D/C    Consulted and Agree with Plan of Care  Patient;Family member/caregiver    Family Member Consulted  Husband - Theodoro Grist       Patient will benefit from skilled therapeutic intervention in order to improve the following deficits and impairments:  Abnormal gait, Decreased balance, Decreased endurance, Decreased mobility, Decreased strength, Difficulty walking, Impaired sensation, Pain  Visit Diagnosis: Spastic hemiplegia of left nondominant side due to noncerebrovascular etiology (HCC)  Foot drop, left  Other abnormalities of gait and mobility     Problem List Patient Active Problem List   Diagnosis Date Noted  . Spastic diplegia, acquired, lower extremity (HCC) 10/16/2017  . Muscle spasticity 08/15/2017  . Gait disturbance 08/15/2017  . Depression with anxiety 08/15/2017  . Diplopia 08/15/2017  . Multiple sclerosis (HCC) 03/04/2017  . High risk medication use 02/06/2017   Dierdre Highman, PT, DPT 02/26/18    4:59 PM    [facility] [address] [place], [place], [address] Phone: [phone]   Fax:  [phone]  Name: Elinore Heffler MRN: [id number] Date of Birth: 1970-06-17

## 2018-03-04 NOTE — Telephone Encounter (Signed)
Additional PT notes, including 36ft. timed walk were submitted to [facility].  Fax received today from them (phone# [phone], fax# [phone]) that Dalfampridine continues to be denied, with letter of explanation to follow by mail. Case# [id number].  Subscriber ID: [id number].  LMOM to let pt. know Dalfampridine has been denied again/fim

## 2018-03-05 ENCOUNTER — Ambulatory Visit: Payer: BLUE CROSS/BLUE SHIELD | Admitting: Physical Therapy

## 2018-03-05 ENCOUNTER — Encounter: Payer: Self-pay | Admitting: Physical Therapy

## 2018-03-05 DIAGNOSIS — M21372 Foot drop, left foot: Secondary | ICD-10-CM | POA: Diagnosis not present

## 2018-03-05 DIAGNOSIS — G8114 Spastic hemiplegia affecting left nondominant side: Secondary | ICD-10-CM

## 2018-03-05 DIAGNOSIS — R2689 Other abnormalities of gait and mobility: Secondary | ICD-10-CM | POA: Diagnosis not present

## 2018-03-05 NOTE — Therapy (Signed)
[facility] [address] [place], [place], [address] Phone: [phone]   Fax:  [phone]  Physical Therapy Treatment and D/C Summary  Patient Details  Name: Sharon Odom MRN: [id number] Date of Birth: 1970/09/14 Referring Provider (PT): Britt Bottom, MD   Encounter Date: 03/05/2018  PT End of Session - 03/05/18 1700    Visit Number  5    Number of Visits  7    Date for PT Re-Evaluation  03/25/18    Authorization Type  BCBS out of state    PT Start Time  1540    PT Stop Time  1620    PT Time Calculation (min)  40 min    Equipment Utilized During Treatment  Other (comment)   Bioness   Activity Tolerance  Patient tolerated treatment well    Behavior During Therapy  [facility] [place] for tasks assessed/performed       Past Medical History:  Diagnosis Date  . Multiple sclerosis ([place])   . Vision abnormalities     Past Surgical History:  Procedure Laterality Date  . EXCISION MORTON'S NEUROMA Right     There were no vitals filed for this visit.  Subjective Assessment - 03/05/18 1544    Subjective  Not able to do 2x/day every day but she is wearing it every day for 2-3 hours, in gait mode x 45 minutes.  Started taking Ampyra again (left over medication) and feels that her energy level has improved.    Patient is accompained by:  Family member    Pertinent History  Has used a walk aide on RLE. MS x 6 years.  Diplopia.  Anxiety and Depression    Limitations  Standing;Walking    How long can you stand comfortably?  5-10 minutes    How long can you walk comfortably?  household distances    Patient Stated Goals  To obtain a functional electrical stimulation unit to assist with foot drop during gait.  Pt is unwilling to wear an AFO    Currently in Pain?  No/denies                       [facility] Adult PT Treatment/Exercise - 03/05/18 1652      Ambulation/Gait   Ambulation/Gait  Yes    Ambulation/Gait Assistance   6: Modified independent (Device/Increase time)    Ambulation/Gait Assistance Details  performed one more adjustment of Bioness settings via Tablet 1 to improve timing of ankle DF.      Ambulation Distance (Feet)  115 Feet    Assistive device  Rollator    Gait Pattern  Lateral trunk lean to right;Decreased hip/knee flexion - right    Ambulation Surface  Level;Indoor    Gait velocity  with rollator and adjusted Bioness settings: .44 ft/sec    Stairs  Yes    Stairs Assistance  6: Modified independent (Device/Increase time)    Stairs Assistance Details (indicate cue type and reason)  with Bioness pt ascends with RLE and descending with RLE.  Decreased trunk flexion and compensatory hip extension used to advance LLE to next step when ascending; increased use of hip and knee flexion to advance LLE    Stair Management Technique  Two rails;Step to pattern;Forwards;Other (comment)   Bioness   Number of Stairs  4    Height of Stairs  6      Modalities   Modalities  Electrical Stimulation      Electrical Stimulation  Electrical Stimulation Location  L anterior tibialis and L hamstring -repositioned upper cuff to more midline on thigh    Electrical Stimulation Action  closed and open chain ankle DF, hip extension and knee flexion.  Open chain hip flexion during marching    Electrical Stimulation Parameters  use of clinic control unit to adjust parameters and intensity of upper and lower cuffs; pt reported improved comfort and tolerance to settings during gait    Electrical Stimulation Goals  Strength;Tone;Pain;Neuromuscular facilitation             PT Education - 03/05/18 1658    Education Details  discussed final recommendations for increasing wear time and stimulation time in gait mode.  HEP to remain the same for now; pt to begin with HHPT if approved by MD.  Discussed return to outpatient therapy once pt has completed HHPT.    Person(s) Educated  Patient;Spouse    Methods   Explanation;Demonstration    Comprehension  Verbalized understanding;Returned demonstration          PT Long Term Goals - 03/05/18 1712      PT LONG TERM GOAL #1   Title  Pt will demonstrate independence with set up and donning of LLE upper and lower Bioness cuffs, pairing to phone and use of app controls    Time  6    Period  Weeks    Status  Achieved      PT LONG TERM GOAL #2   Title  Pt will demonstrate independence with skin checks and care of Bioness electrodes (drying, changing every 2 weeks)    Time  6    Period  Weeks    Status  Achieved      PT LONG TERM GOAL #3   Title  Pt will demonstrate independence with Bioness training HEP    Time  6    Period  Weeks    Status  Achieved      PT LONG TERM GOAL #4   Title  Pt will progress to wearing Bioness 8 hours/day in gait mode with 15 min, 2x/day in training mode    Time  6    Period  Weeks    Status  Not Met      PT LONG TERM GOAL #5   Title  Pt will improve functional gait velocity with Bioness and LRAD to >/= 1.8 ft/sec     Baseline  TBD    Time  6    Period  Weeks    Status  Not Met      PT LONG TERM GOAL #6   Title  Pt will ambulate x 115' over indoor surfaces with LRAD/Bioness and negotiate 4 stairs with 2 rails MOD I    Time  6    Period  Weeks    Status  Achieved            Plan - 03/05/18 1712    Clinical Impression Statement  Treatment session focused on finalizing parameters for home Bioness unit and assessment of final LTG.  Pt made slow but steady progress and has met 4/6 LTG.  She demonstrates independence with skin care/checks, care of Bioness, donning and doffing of Bioness, and HEP.  Pt did not meet goal of wearing Bioness 8 hours a day in gait mode with 15 min of training time.  Pt demonstrated limited wearing tolerance due to bacterial infection, deconditioning and ongoing weakness due to not being able to take MS medication due to  insurance denial.  Pt has progressed to wearing Bioness for  2-3 hours, 1-2x/day and in gait mode x 45 minutes.  She is MOD I for ambulation short distances in household environment and stairs with rail but did not make significant improvements in gait velocity.  Recommended pt continue to slowly progress wear time.  Pt would benefit from ongoing skilled PT services but due to significant deconditioning and energy expenditure to leave her home 2x/week for therapy sessions pt may benefit from HHPT and then return to outpatient when able.  Pt to be D/C from outpatient PT at this time now that [place] training is complete.    Rehab Potential  Good    PT Frequency  1x / week    PT Duration  6 weeks   Bioness eval only   PT Treatment/Interventions  Electrical Stimulation;ADLs/Self Care Home Management;DME Instruction;Gait training;Stair training;Functional mobility training;Therapeutic activities;Therapeutic exercise;Balance training;Neuromuscular re-education;Patient/family education;Passive range of motion;Energy conservation    Consulted and Agree with Plan of Care  Patient;Family member/caregiver    Family Member Consulted  Husband - Waunita Schooner       Patient will benefit from skilled therapeutic intervention in order to improve the following deficits and impairments:  Abnormal gait, Decreased balance, Decreased endurance, Decreased mobility, Decreased strength, Difficulty walking, Impaired sensation, Pain  Visit Diagnosis: Spastic hemiplegia of left nondominant side due to noncerebrovascular etiology (HCC)  Foot drop, left  Other abnormalities of gait and mobility     Problem List Patient Active Problem List   Diagnosis Date Noted  . Spastic diplegia, acquired, lower extremity ([place]) 10/16/2017  . Muscle spasticity 08/15/2017  . Gait disturbance 08/15/2017  . Depression with anxiety 08/15/2017  . Diplopia 08/15/2017  . Multiple sclerosis ([place]) 03/04/2017  . High risk medication use 02/06/2017    PHYSICAL THERAPY DISCHARGE SUMMARY  Visits from  Start of Care: 5  Current functional level related to goals / functional outcomes: See impression statement and LTG above   Remaining deficits: Impaired strength, endurance, balance, gait   Education / Equipment: Bioness, HEP  Plan: Patient agrees to discharge.  Patient goals were partially met. Patient is being discharged due to being pleased with the current functional level.  ?????     Rico Junker, PT, DPT 03/05/18    5:18 PM    [facility] [address] [place], [place], [address] Phone: [phone]   Fax:  [phone]  Name: Halei Hanover MRN: [id number] Date of Birth: [birth year]

## 2018-03-06 ENCOUNTER — Encounter: Payer: Self-pay | Admitting: Neurology

## 2018-03-10 DIAGNOSIS — G35 Multiple sclerosis: Secondary | ICD-10-CM | POA: Diagnosis not present

## 2018-03-11 ENCOUNTER — Other Ambulatory Visit: Payer: Self-pay | Admitting: Neurology

## 2018-03-11 DIAGNOSIS — G35 Multiple sclerosis: Secondary | ICD-10-CM | POA: Diagnosis not present

## 2018-03-11 MED ORDER — FUROSEMIDE 20 MG PO TABS: 20.0000 mg | ORAL_TABLET | Freq: Every day | ORAL | 0 refills | Status: DC | PRN

## 2018-03-12 DIAGNOSIS — G35 Multiple sclerosis: Secondary | ICD-10-CM | POA: Diagnosis not present

## 2018-04-02 DIAGNOSIS — F341 Dysthymic disorder: Secondary | ICD-10-CM | POA: Diagnosis not present

## 2018-04-02 DIAGNOSIS — F419 Anxiety disorder, unspecified: Secondary | ICD-10-CM | POA: Diagnosis not present

## 2018-04-02 DIAGNOSIS — G35 Multiple sclerosis: Secondary | ICD-10-CM | POA: Diagnosis not present

## 2018-04-02 DIAGNOSIS — G894 Chronic pain syndrome: Secondary | ICD-10-CM | POA: Diagnosis not present

## 2018-04-08 ENCOUNTER — Encounter: Payer: Self-pay | Admitting: Neurology

## 2018-04-11 ENCOUNTER — Other Ambulatory Visit: Payer: Self-pay | Admitting: *Deleted

## 2018-04-11 ENCOUNTER — Telehealth: Payer: Self-pay | Admitting: Neurology

## 2018-04-11 ENCOUNTER — Telehealth: Payer: Self-pay | Admitting: *Deleted

## 2018-04-11 DIAGNOSIS — R899 Unspecified abnormal finding in specimens from other organs, systems and tissues: Secondary | ICD-10-CM

## 2018-04-11 DIAGNOSIS — Z79899 Other long term (current) drug therapy: Secondary | ICD-10-CM

## 2018-04-11 MED ORDER — ONABOTULINUMTOXINA 200 UNITS IJ SOLR: 400.0000 [IU] | INTRAMUSCULAR | 3 refills | Status: DC

## 2018-04-11 NOTE — Telephone Encounter (Signed)
Based on patient's recent [place] labs (increased creatinine), Dr. Epimenio Foot would like the patient to come in for additional labs.  Per vo by Dr. Epimenio Foot, patient needs BMP and UA.  Orders placed in Epic.  I left patient a message requesting a call back to discuss results.  Also, informed her Dr. Epimenio Foot would like to have further labs drawn.

## 2018-04-11 NOTE — Telephone Encounter (Signed)
completed

## 2018-04-11 NOTE — Telephone Encounter (Signed)
Sharon Odom, could you send a script to [facility] for Botox 400 units electronically.

## 2018-04-11 NOTE — Telephone Encounter (Signed)
Spoke to the patient - she will have her labs drawn on Monday.  She also has an appt scheduled with Dr. Epimenio Foot that day.

## 2018-04-14 ENCOUNTER — Telehealth (HOSPITAL_COMMUNITY): Payer: Self-pay

## 2018-04-14 ENCOUNTER — Ambulatory Visit (INDEPENDENT_AMBULATORY_CARE_PROVIDER_SITE_OTHER): Payer: BLUE CROSS/BLUE SHIELD | Admitting: Neurology

## 2018-04-14 ENCOUNTER — Encounter: Payer: Self-pay | Admitting: Neurology

## 2018-04-14 ENCOUNTER — Other Ambulatory Visit: Payer: Self-pay

## 2018-04-14 ENCOUNTER — Telehealth: Payer: Self-pay | Admitting: *Deleted

## 2018-04-14 VITALS — BP 127/84 | HR 78 | Ht 69.0 in | Wt 204.5 lb

## 2018-04-14 DIAGNOSIS — R269 Unspecified abnormalities of gait and mobility: Secondary | ICD-10-CM

## 2018-04-14 DIAGNOSIS — G35 Multiple sclerosis: Secondary | ICD-10-CM

## 2018-04-14 DIAGNOSIS — Z79899 Other long term (current) drug therapy: Secondary | ICD-10-CM | POA: Diagnosis not present

## 2018-04-14 DIAGNOSIS — F418 Other specified anxiety disorders: Secondary | ICD-10-CM

## 2018-04-14 DIAGNOSIS — R899 Unspecified abnormal finding in specimens from other organs, systems and tissues: Secondary | ICD-10-CM

## 2018-04-14 DIAGNOSIS — R1310 Dysphagia, unspecified: Secondary | ICD-10-CM

## 2018-04-14 DIAGNOSIS — M62838 Other muscle spasm: Secondary | ICD-10-CM

## 2018-04-14 DIAGNOSIS — G8220 Paraplegia, unspecified: Secondary | ICD-10-CM | POA: Diagnosis not present

## 2018-04-14 MED ORDER — DIAZEPAM 5 MG PO TABS: 5.0000 mg | ORAL_TABLET | Freq: Four times a day (QID) | ORAL | 2 refills | Status: DC | PRN

## 2018-04-14 NOTE — Telephone Encounter (Signed)
Noted, I scheduled pt. 

## 2018-04-14 NOTE — Progress Notes (Signed)
[facility]  PATIENT: Sharon Odom DOB: 08-29-1970  REFERRING DOCTOR OR PCP:  Sharon Odom SOURCE: Patient, notes imaging and lab reports, MRI images on CD from neurology,  _________________________________   HISTORICAL during 1 of her exacerbations a couple years ago  CHIEF COMPLAINT:  Chief Complaint  Patient presents with  . Follow-up    RM 12 with family- Sharon Odom and Sharon Odom. Last seen 12/19/17. Had UA and BMP completed today in our office. Sx worsened this past Feb 2019.   . Multiple Sclerosis    On Lemtrada. Just received her third year.   . Gait Problem    Pt uses cane for abulation. In wheelchair in office today. Reports increased ambulation issues.   . Spasms    Feels baclofen/botox ineffetive. Wants to discuss baclofen pump as treatment for spasticity    HISTORY OF PRESENT ILLNESS: Sharon Odom is a 47 y.o. woman with multiple sclerosis, left greater than right spasticity and poor gait.  Update 04/14/2018: During REMS testing her creatinine level was elevated at 1.41 (it was 0.91 1 month earlier).   Of note, she reports that her creatinine has fluctuated over the years and that there was one other time when she was felt to have a high reading that was felt to be due to dehydration.  She has noted decreased oral liquid intake at times due to dysphasia.   We discussed possibility of an autoimmune kidney disease as that can be seen at about 1: 400 Lemtrada patients  Spasticity is her main issue.    The left leg, especially, spasms up with movements.   Botox had not had much of a benefit on the left and maybe a 25% benefit in the milder right.    Spasms are very painful.    She also has spasticity in the left arm.    Pain feels like the arm is being held tightly by someone.  The left leg does worse with activity so she can't exercise with it.     She has done PT in the past but it has not helped much.    She is having more trouble with basic ADL.   She got  the [facility] but it did not help that much.      She tires out easily with any activity.      She notes more trouble with swallowing liquids and is drinking less.     The problem is initiating the swallowing.     Hr sister notes that she seems to be having some hearing loss as she turns up TV louder and misses what people say at times.    Her anxiety is worse and she is back on Xanax.     More spasticity in the left arm, especially after squeezing, she can't easily straighten the fingers back up  Update 01/23/2018: Her main problem continues to be spasticity in the legs, left greater than right.  Associated with the spasticity she has quite a bit of pain.  She felt she received benefit from the Botox injections into the legs for a few weeks but then the spasticity returned to the same level.  We discussed using a larger dose today and will consider increasing the dose further based on her response.  She is able to tolerate the baclofen better and has been able to increase the dose.  Gait is poor.  Ampyra seems to be helping a little bit.  She had 2 years of Lemtrada therapy, with the  last dose about 15 months ago.  We are in the process of trying to get an additional year of [place] approved.    Update 10/16/2017: After the last visit, the baclofen dose was increased but she had trouble tolerating higher doses and did not think that her spasticity improved any.  She continues to report a lot of spasticity in both legs, left greater than right and in the left arm.  She denies spasticity in the right arm.  There continues to phasic spasms superimposed on the tonic spasms predominantly involving both feet and the left leg.  Gait is poor.  Around the home she will use a walker and can get from one room to another.   At the last visit I also started lamotrigine for her dysesthesias but it was poorly tolerated and she stopped.  Previously she had been unable to tolerate oxcarbazepine.  She has not yet  restarted the Ampyra.  In the past, she had received Botox injections into muscles of the left lower leg and both feet.  This helped quite a bit with the first series but less so with the second series.  Since she has moved up to this area, she has not had any more Botox injections.  She had her first course of Lemtrada April 2017 and the second course of April 2018.  She has noted some further progression over the last year and we have discussed an additional third course.  She had shingles earlier this year.  Pain has resolved.  From 08/15/2017:  She was diagnosed in October 2013 due to difficulty with her handwriting and right hand coordination.   A few weeks later she had gait ataxia and numbness and was referred to MS.   She had an MRI of the brain (was in [place] at the time) and then had a LP confirming the diagnosis.  Initially, she was placed on Copaxone but had severe frequent exacerbations and then went to Tecfidera due to breakthrough exacerbations.  She was referred to [facility] for Tysabri (Feb 2014 to November 2016).  She then moved to [facility] and saw Dr. Ilene Odom [facility] [phone]).   She was always JCV Ab titer positive.    She did well on it but stopped 03/2015 due to safety risk.  She had more spasticity early 2017 that improved with 5 days IV Solumedrol and then started [facility] April 2017.   The infusion went well.    She started having more spasms in her legs later in 2017 and had Botox injections into her feet (some benefit but only for 3-4 weeks).   She had a second course of Botox November 2017 without much benefit.     She had numbness and tingling in her left arm November 2017 and was given an ESI.   Medical MJ did not help.   She had her second course of Lemtrada in April 2018 but the steroid did not help her spasticity as it did the first year.    She saw Dr. Gaynell Odom at [facility] a couple times in 2018 and also saw Dr. Aquilla Odom at [facility].  Currently,    She uses a walker (rarely cane) around her home and is wheeled outside the house.   Earlier this year, she had worsening spasticity with toes very spastic and legs locking up.    The severe leg spasms have improved but she still has spasms in her feet that are severe and painful.   Spasticity is worse on the  left (foot, leg and arm) and only in her foot on the right.    She takes 20 mg po qid.   Tizanidine did not help much.   She has some painful dysesthesia but gabapentin had not helped.   She continues to have a lot of pain, worse on her left.     Her left arm is also weak and she can't do many self care tasks (dressing, etc).    Due to the pain, she was drinking alcohol (2 - 3 drinks).   Clonazepam was tried but doses were low.   She was on Ampyra in the past 2014-2017 but felt it wasn't working and stopped.   She was on both Ampyra and Wellbutrin for at least one year and there were no seizures.     She has urinary frequency and urgency, helped by Myrbetriq.   Sometimes due to her gait, she cannot get to the bathroom in time.    She had diplopia during 1 of her exacerbations a few years ago.  She continues to have some double vision though she is doing better now.    She never had optic neuritis.    Wellbutrin helped her mood better initially and she is very frustrated and irritable.   She was diagnosed with PBA in May 2018 and started on Nuedexta.   She did better mood-wise but spasticity was worse and she stopped.       She feels cognitive skills are mildly worse with mild short term memory issues and decreased focus and attention.     I have reviewed the MRI of the brain and cervical spine dated 03/19/2017.  The MRI of the brain shows multiple T2/FLAIR hyperintense foci in the cerebellum, pons, left greater than right thalamus and in the periventricular, juxtacortical and deep white matter of both hemispheres.    The MRI of the cervical spine showed a subtle focus Adjacent to C4-C5.  She has a disc  protrusion at [facility].  REVIEW OF SYSTEMS: Constitutional: No fevers, chills, sweats, or change in appetite.  She reports fatigue. Eyes: As above Ear, nose and throat: No hearing loss, ear pain, nasal congestion, sore throat Cardiovascular: No chest pain, palpitations Respiratory: No shortness of breath at rest or with exertion.   No wheezes GastrointestinaI: No nausea, vomiting, diarrhea, abdominal pain, fecal incontinence Genitourinary:As above  musculoskeletal:She has pain in her legs, left greater than right due to spasticity.  She notes mild back pain. Integumentary: No rash, pruritus, skin lesions Neurological: as above Psychiatric: As above Endocrine: No palpitations, diaphoresis, change in appetite, change in weigh or increased thirst Hematologic/Lymphatic: No anemia, purpura, petechiae. Allergic/Immunologic: No itchy/runny eyes, nasal congestion, recent allergic reactions, rashes  ALLERGIES: Allergies  Allergen Reactions  . Morphine Other (See Comments)    HOME MEDICATIONS:  Current Outpatient Medications:  .  acetaminophen (TYLENOL) 650 MG CR tablet, Take by mouth., Disp: , Rfl:  .  acetaminophen-codeine (TYLENOL #3) 300-30 MG tablet, Take 1 tablet by mouth every 8 (eight) hours as needed for moderate pain., Disp: 90 tablet, Rfl: 2 .  Alemtuzumab (LEMTRADA) 12 MG/1.2ML SOLN, Inject 12 mg into the vein., Disp: , Rfl:  .  ALPRAZolam (XANAX) 0.5 MG tablet, Take 0.5 mg by mouth at bedtime as needed for anxiety. 1-3 tablets as needed, Disp: , Rfl:  .  baclofen (LIORESAL) 20 MG tablet, Take up to 6 pills daily for MS spasticity, Disp: 540 each, Rfl: 3 .  Botulinum Toxin Type A (BOTOX) 200  units SOLR, Inject 400 Units as directed every 3 (three) months., Disp: 4 each, Rfl: 3 .  buPROPion (WELLBUTRIN XL) 300 MG 24 hr tablet, Take by mouth., Disp: , Rfl:  .  Cholecalciferol (VITAMIN D-3) 5000 units TABS, Take by mouth., Disp: , Rfl:  .  Cyanocobalamin (VITAMIN B-12) 2500 MCG  SUBL, Place under the tongue., Disp: , Rfl:  .  furosemide (LASIX) 20 MG tablet, Take 1 tablet (20 mg total) by mouth daily as needed., Disp: 15 tablet, Rfl: 0 .  ibuprofen (ADVIL,MOTRIN) 200 MG tablet, Take 400 mg by mouth as needed. , Disp: , Rfl:  .  Mag Aspart-Potassium Aspart (RA POTASSIUM/MAGNESIUM) 250-250 MG CAPS, daily, Disp: , Rfl:  .  Omega-3 Fatty Acids (FISH OIL PO), Take by mouth., Disp: , Rfl:  .  ondansetron (ZOFRAN) 8 MG tablet, as needed With infusions, Disp: 20 tablet, Rfl: 1 .  potassium chloride (MICRO-K) 10 MEQ CR capsule, Take by mouth., Disp: , Rfl:  .  ranitidine (ZANTAC) 300 MG capsule, Take by mouth., Disp: , Rfl:  .  valACYclovir (VALTREX) 500 MG tablet, Take 1 tablet (500 mg total) by mouth 2 (two) times daily., Disp: 180 tablet, Rfl: 0 .  diazepam (VALIUM) 5 MG tablet, Take 1 tablet (5 mg total) by mouth every 6 (six) hours as needed for anxiety., Disp: 90 tablet, Rfl: 2 .  mirabegron ER (MYRBETRIQ) 50 MG TB24 tablet, Take by mouth., Disp: , Rfl:   PAST MEDICAL HISTORY: Past Medical History:  Diagnosis Date  . Multiple sclerosis (HCC)   . Vision abnormalities     PAST SURGICAL HISTORY:   FAMILY HISTORY: Family History  Problem Relation Age of Onset  . Diabetes Mother   . Healthy Brother     SOCIAL HISTORY:  Social History   Socioeconomic History  . Marital status: Married    Spouse name: Not on file  . Number of children: Not on file  . Years of education: Not on file  . Highest education level: Not on file  Occupational History  . Not on file  Social Needs  . Financial resource strain: Not on file  . Food insecurity:    Worry: Not on file    Inability: Not on file  . Transportation needs:    Medical: Not on file    Non-medical: Not on file  Tobacco Use  . Smoking status: Current Every Day Smoker  . Smokeless tobacco: Current User  Substance and Sexual Activity  . Alcohol use: Yes    Comment: 2-3 times per wk  . Drug use: Never  .  Sexual activity: Not on file  Lifestyle  . Physical activity:    Days per week: Not on file    Minutes per session: Not on file  . Stress: Not on file  Relationships  . Social connections:    Talks on phone: Not on file    Gets together: Not on file    Attends religious service: Not on file    Active member of club or organization: Not on file    Attends meetings of clubs or organizations: Not on file    Relationship status: Not on file  . Intimate partner violence:    Fear of current or ex partner: Not on file    Emotionally abused: Not on file    Physically abused: Not on file    Forced sexual activity: Not on file  Other Topics Concern  . Not on file  Social History  Narrative   Right handed    Caffeine: 1 cup or less per day- coffee   [employer]     PHYSICAL EXAM  Vitals:   04/14/18 0827  BP: 127/84  Pulse: 78  Weight: 204 lb 8 oz (92.8 kg)  Height: 5\' 9"  (1.753 m)    Body mass index is 30.2 kg/m.   General: The patient is well-developed and well-nourished and in no acute distress  Neurologic Exam  Mental status: The patient is alert and oriented x 3 at the time of the examination. The patient has apparent normal recent and remote memory, with an apparently normal attention span and concentration ability.   Speech is normal.  Cranial nerves: Extraocular movements are full.  Facial strength and sensation is normal.  The tongue is midline, and the patient has symmetric elevation of the soft palate. No obvious hearing deficits are noted.  Motor:   Making a fist triggered flexion spasms in the left hand/fingers.  After standing for about 20 to 30 seconds, she had flexion spasms of the left foot.  Muscle bulk is normal.  There is increased muscle tone in the legs, left greater than right.  She has moderate increased muscle tone in the left arm.  The right arm has normal muscle tone.  Strength is 4to 4+/5 in the left arm, 3-4-/5 in the left leg and 4- to 4/5 in the right  leg .     Sensory: Normal sensation to touch and vibration in the arms.  She has reduced sensation to vibration in the left leg.  Coordination: Cerebellar testing reveals good right finger-nose-finger and reduced left finger-nose-finger.  Right heel-to-shin is poor.  She cannot do heel-to-shin on the left.  Gait and station: She needs support to stand.  Her gait is spastic and needs bilateral support.  She cannot do a tandem walk.  She is walking faster since starting the Ampyra Romberg is positive.  Reflexes: Deep tendon reflexes are increased in the legs with spread at the knees and clonus at the ankles, nonsustained bilaterally but more on the left.   plantar responses are flexor.  25 foot timed walk off Ampyra was 58.2 sec (average of 2 trials).       DIAGNOSTIC DATA (LABS, IMAGING, TESTING) - I reviewed patient records, labs, notes, testing and imaging myself where available.     ASSESSMENT AND PLAN  Multiple sclerosis (HCC) - Plan: SLP modified barium swallow, DG OP Swallowing Func-Medicare/Speech Path, PR HOME HEALTH AIDE OR CERTIFIE, Ambulatory referral to Physical Therapy  High risk medication use - Plan: Basic Metabolic Panel, Urinalysis  Abnormal laboratory test result - Plan: Basic Metabolic Panel, Urinalysis  Spastic diplegia, acquired, lower extremity (HCC) - Plan: PR HOME HEALTH AIDE OR CERTIFIE, Ambulatory referral to Physical Therapy  Muscle spasticity  Gait disturbance - Plan: PR HOME HEALTH AIDE OR CERTIFIE, Ambulatory referral to Physical Therapy  Depression with anxiety  Dysphagia, unspecified type - Plan: SLP modified barium swallow, DG OP Swallowing Func-Medicare/Speech Path   1.    She will be returning later this week for Botox injections.  I had initially planned 400 units (300 units were used last time into legs/feet) but we will increase this further to 600 units so we can place more Botox into the left leg and also treat the flexion spasms of the  left arm.   2.   Recheck creatinine and UA today.  If the creatinine has not improved we will need to check anti-glomerular  basement membrane antibodies and have her see nephrology.    3.    Once the kidney function is resolved I will see if we can get her authorize for dalfampridine ER 10 mg twice daily. 4.    Return in 3 months or sooner if there are new or worsening neurologic symptoms.   Addendum:   I discussed her spasticity with Dr. Terrace Arabia and she is willing to do her next series of Botox injections.  Additionally I will increase the Botox to 600 units, approximately 400 in the left leg, 100 in the left arm and 100 U in the right foot ---RAS  60 minutes Odom-to-Odom evaluation addressing her many concerns related to her MS, severe spasticity and worsening function   [place] Winslow A. Epimenio Foot, MD, [facility] 04/14/2018, 5:53 PM Certified in Neurology, Clinical Neurophysiology, Sleep Medicine, Pain Medicine and Neuroimaging  [facility] [address], [address] [place], [place] [address] [phone]

## 2018-04-14 NOTE — Telephone Encounter (Signed)
Patient called stating delivery date for Botox is 04-16-18. A returned call is not needed unless there are questions.

## 2018-04-14 NOTE — Telephone Encounter (Signed)
Attempted to contact patient to schedule MBS - lm on vm °

## 2018-04-14 NOTE — Telephone Encounter (Signed)
I advised patient of previous message. She will come in on 04-18-18 @ 8am for an 8:15am Botox appointment with Dr. Terrace Arabia.

## 2018-04-14 NOTE — Telephone Encounter (Signed)
Noted, thank you

## 2018-04-14 NOTE — Telephone Encounter (Signed)
Called, LVM for pt to call. Spoke with Dr. Epimenio Foot. He spoke with Dr. Terrace Arabia and Duwayne Heck. Was able to request for more botox for her appt Friday. Dr. Terrace Arabia offered to do injections Friday.  Wanting her to come in 8:15am so she has enough time for injections.

## 2018-04-14 NOTE — Telephone Encounter (Addendum)
I called patient to make her aware she needs to call the pharmacy regarding Botox medication [phone]. She did not answer so I left her a VM asking her to call back.

## 2018-04-15 ENCOUNTER — Telehealth: Payer: Self-pay | Admitting: *Deleted

## 2018-04-15 ENCOUNTER — Other Ambulatory Visit (HOSPITAL_COMMUNITY): Payer: Self-pay | Admitting: *Deleted

## 2018-04-15 ENCOUNTER — Other Ambulatory Visit: Payer: Self-pay | Admitting: *Deleted

## 2018-04-15 DIAGNOSIS — R1310 Dysphagia, unspecified: Secondary | ICD-10-CM

## 2018-04-15 DIAGNOSIS — G35 Multiple sclerosis: Secondary | ICD-10-CM

## 2018-04-15 DIAGNOSIS — R269 Unspecified abnormalities of gait and mobility: Secondary | ICD-10-CM

## 2018-04-15 DIAGNOSIS — G8220 Paraplegia, unspecified: Secondary | ICD-10-CM

## 2018-04-15 LAB — BASIC METABOLIC PANEL
BUN/Creatinine Ratio: 11 (ref 9–23)
BUN: 11 mg/dL (ref 6–24)
CO2: 22 mmol/L (ref 20–29)
Calcium: 10.3 mg/dL — ABNORMAL HIGH (ref 8.7–10.2)
Chloride: 105 mmol/L (ref 96–106)
Creatinine, Ser: 0.96 mg/dL (ref 0.57–1.00)
GFR calc Af Amer: 82 mL/min/{1.73_m2} (ref >59)
GFR calc non Af Amer: 71 mL/min/{1.73_m2} (ref >59)
Glucose: 110 mg/dL — ABNORMAL HIGH (ref 65–99)
Potassium: 4.5 mmol/L (ref 3.5–5.2)
Sodium: 143 mmol/L (ref 134–144)

## 2018-04-15 LAB — URINALYSIS
Bilirubin, UA: NEGATIVE
Glucose, UA: NEGATIVE
Ketones, UA: NEGATIVE
Leukocytes, UA: NEGATIVE
Nitrite, UA: NEGATIVE
Protein, UA: NEGATIVE
RBC, UA: NEGATIVE
Specific Gravity, UA: 1.01 (ref 1.005–1.030)
Urobilinogen, Ur: 0.2 mg/dL (ref 0.2–1.0)
pH, UA: 5.5 (ref 5.0–7.5)

## 2018-04-15 NOTE — Telephone Encounter (Signed)
-----   Message from Hayden Pedro, RN sent at 04/15/2018  2:46 PM EST -----   ----- Message ----- From: Asa Lente, MD Sent: 04/15/2018   2:44 PM EST To: Magnus Sinning Misenheimer, RN  Please let her know that the creatinine level and the urinalysis were both normal.  Thus, the Lemtrada [facility] lab work could have been or due to dehydration or lab error.

## 2018-04-15 NOTE — Telephone Encounter (Signed)
Spoke to patient - she is aware of her lab results and verbalized understanding.   

## 2018-04-15 NOTE — Telephone Encounter (Signed)
-----   Message from Faith I Misenheimer, RN sent at 04/15/2018  2:46 PM EST -----   ----- Message ----- From: Sater, Richard A, MD Sent: 04/15/2018   2:44 PM EST To: Faith I Misenheimer, RN  Please let her know that the creatinine level and the urinalysis were both normal.  Thus, the Lemtrada REMS lab work could have been or due to dehydration or lab error. 

## 2018-04-15 NOTE — Telephone Encounter (Signed)
Noted, thank you

## 2018-04-16 NOTE — Telephone Encounter (Signed)
Pt aware. See other phone note.

## 2018-04-16 NOTE — Telephone Encounter (Signed)
I received a letter from the pharmacy stating that the patients Botox script needed clarification. I called the pharmacy and they stated that they have two prescriptions on file for this patient. One is arriving today and one is pending he pharmacy. I asked them to cancel out the pending script. DW

## 2018-04-18 ENCOUNTER — Other Ambulatory Visit (HOSPITAL_COMMUNITY): Payer: Self-pay | Admitting: Neurology

## 2018-04-18 ENCOUNTER — Ambulatory Visit (INDEPENDENT_AMBULATORY_CARE_PROVIDER_SITE_OTHER): Payer: BLUE CROSS/BLUE SHIELD | Admitting: Neurology

## 2018-04-18 ENCOUNTER — Telehealth: Payer: Self-pay | Admitting: Neurology

## 2018-04-18 ENCOUNTER — Ambulatory Visit: Payer: BLUE CROSS/BLUE SHIELD | Admitting: Neurology

## 2018-04-18 VITALS — BP 122/81 | HR 87

## 2018-04-18 DIAGNOSIS — G8220 Paraplegia, unspecified: Secondary | ICD-10-CM

## 2018-04-18 DIAGNOSIS — G35 Multiple sclerosis: Secondary | ICD-10-CM | POA: Diagnosis not present

## 2018-04-18 DIAGNOSIS — R1310 Dysphagia, unspecified: Secondary | ICD-10-CM

## 2018-04-18 NOTE — Telephone Encounter (Signed)
3 mo Botox inj  °

## 2018-04-18 NOTE — Progress Notes (Signed)
PATIENT: Sharon Odom DOB: 27-Mar-1971  Chief Complaint  Patient presents with  . Botulinum Toxin Injection    Total 600U botox. Dr. Epimenio Foot pt.      HISTORICAL   Sharon Odom is a 46 years old female, accompanied by her husband, referred by Dr. Epimenio Foot for evaluation of botulism toxin injection for spasticity due to multiple sclerosis, this is the first time I evaluated her, also performed injection today on April 18, 2018.  She was diagnosed with MS since October 2013 presented with lack of coordination of right hand difficulty writing, few weeks later, she developed gait ataxia, numbness    Diagnosis was confirmed by abnormal MRI of the brain, LP confirmed the diagnosis, initially she was treated with Copaxone, then went to Tecfidera due to breakthrough exacerbation, received Tysarbri infusion from February 2014 to November 2016, she has always been JC virus antibody positive, Tysarbri infusion was stopped in November 2016 due to safety risk.  She received Lemtrada infusion in April 2017, second dose April 2018, due to continued mild progression of her symptoms, she complete her third Lemtrada infusion in October 2019.  She has been complaining of bilateral lower extremity, left upper extremity spasticity since late 2017, gradually getting worse, has tried different medications, including baclofen up to 120 mg daily, with limited benefit, she is not taking baclofen 80 mg, previously also tried and failed tizanidine, Trileptal, lower dose of botulism toxin a injection in the past only mild temporarily improvement.  She did not ambulate with walker at home, wheelchair for longer distance, complains painful frequent left lower extremity spasm, forceful left lower extremity extension, when she bear weight, she has left toes forceful painful flexion grabbing on the floor, also left shoulder, arm spasm, difficulty release left hand grip.  She was recently started on Valium 5 mg every 6 hours,  which has been helpful.  I personally reviewed the MRI of the brain and cervical spine in September 2019, the MRI of the brain shows multiple T2/FLAIR hyperintense foci in brainstem, cerebellum, left thalamus, hemisphere, consistent with typical chronic demyelinating plaque, no enhancement. MRI of cervical spine showed small T2/flair hyperintensity foci posterior to the left C3-4, several 45 also noted within the brainstem, multilevel mild degenerative changes.  No significant canal stenosis.   She also complains of urinary urgency, frequency, is taking Myrbetriq 50 mg, Wellbutrin 300 mg for depression, mild swallowing difficulty, swallowing evaluation is pending   REVIEW OF SYSTEMS: Full 14 system review of systems performed and notable only for as above All other review of systems were negative.  ALLERGIES: Allergies  Allergen Reactions  . Morphine Other (See Comments)    HOME MEDICATIONS: Current Outpatient Medications  Medication Sig Dispense Refill  . acetaminophen (TYLENOL) 650 MG CR tablet Take by mouth.    Marland Kitchen acetaminophen-codeine (TYLENOL #3) 300-30 MG tablet Take 1 tablet by mouth every 8 (eight) hours as needed for moderate pain. 90 tablet 2  . Alemtuzumab (LEMTRADA) 12 MG/1.2ML SOLN Inject 12 mg into the vein.    . baclofen (LIORESAL) 20 MG tablet Take up to 6 pills daily for MS spasticity 540 each 3  . Botulinum Toxin Type A (BOTOX) 200 units SOLR Inject 400 Units as directed every 3 (three) months. 4 each 3  . buPROPion (WELLBUTRIN XL) 300 MG 24 hr tablet Take by mouth.    . Cholecalciferol (VITAMIN D-3) 5000 units TABS Take by mouth.    . Cyanocobalamin (VITAMIN B-12) 2500 MCG SUBL Place under the  tongue.    . diazepam (VALIUM) 5 MG tablet Take 1 tablet (5 mg total) by mouth every 6 (six) hours as needed for anxiety. 90 tablet 2  . furosemide (LASIX) 20 MG tablet Take 1 tablet (20 mg total) by mouth daily as needed. 15 tablet 0  . ibuprofen (ADVIL,MOTRIN) 200 MG tablet  Take 400 mg by mouth as needed.     . ondansetron (ZOFRAN) 8 MG tablet as needed With infusions 20 tablet 1  . ranitidine (ZANTAC) 300 MG capsule Take by mouth.    . valACYclovir (VALTREX) 500 MG tablet Take 1 tablet (500 mg total) by mouth 2 (two) times daily. 180 tablet 0  . ALPRAZolam (XANAX) 0.5 MG tablet Take 0.5 mg by mouth at bedtime as needed for anxiety. 1-3 tablets as needed    . Mag Aspart-Potassium Aspart (RA POTASSIUM/MAGNESIUM) 250-250 MG CAPS daily    . mirabegron ER (MYRBETRIQ) 50 MG TB24 tablet Take by mouth.    . Omega-3 Fatty Acids (FISH OIL PO) Take by mouth.    . potassium chloride (MICRO-K) 10 MEQ CR capsule Take by mouth.     No current facility-administered medications for this visit.     PAST MEDICAL HISTORY: Past Medical History:  Diagnosis Date  . Multiple sclerosis (HCC)   . Vision abnormalities     PAST SURGICAL HISTORY: Past Surgical History:  Procedure Laterality Date  . EXCISION MORTON'S NEUROMA Right     FAMILY HISTORY: Family History  Problem Relation Age of Onset  . Diabetes Mother   . Healthy Brother     SOCIAL HISTORY: Social History   Socioeconomic History  . Marital status: Married    Spouse name: Not on file  . Number of children: Not on file  . Years of education: Not on file  . Highest education level: Not on file  Occupational History  . Not on file  Social Needs  . Financial resource strain: Not on file  . Food insecurity:    Worry: Not on file    Inability: Not on file  . Transportation needs:    Medical: Not on file    Non-medical: Not on file  Tobacco Use  . Smoking status: Current Every Day Smoker  . Smokeless tobacco: Current User  Substance and Sexual Activity  . Alcohol use: Yes    Comment: 2-3 times per wk  . Drug use: Never  . Sexual activity: Not on file  Lifestyle  . Physical activity:    Days per week: Not on file    Minutes per session: Not on file  . Stress: Not on file  Relationships  . Social  connections:    Talks on phone: Not on file    Gets together: Not on file    Attends religious service: Not on file    Active member of club or organization: Not on file    Attends meetings of clubs or organizations: Not on file    Relationship status: Not on file  . Intimate partner violence:    Fear of current or ex partner: Not on file    Emotionally abused: Not on file    Physically abused: Not on file    Forced sexual activity: Not on file  Other Topics Concern  . Not on file  Social History Narrative   Right handed    Caffeine: 1 cup or less per day- coffee   [employer]     PHYSICAL EXAM   Vitals:  04/18/18 0823  BP: 122/81  Pulse: 87    Not recorded      There is no height or weight on file to calculate BMI.  PHYSICAL EXAMNIATION:  Gen: NAD, conversant, well nourised, obese, well groomed                     Cardiovascular: Regular rate rhythm, no peripheral edema, warm, nontender. Eyes: Conjunctivae clear without exudates or hemorrhage Neck: Supple, no carotid bruits. Pulmonary: Clear to auscultation bilaterally   NEUROLOGICAL EXAM:  MENTAL STATUS: Speech: Mild slow spastic speech fluent and spontaneous with normal comprehension.  Cognition:     Orientation to time, place and person     Normal recent and remote memory     Normal Attention span and concentration     Normal Language, naming, repeating,spontaneous speech     Fund of knowledge   CRANIAL NERVES: CN II: Visual fields are full to confrontation.  Pupils are round equal and briskly reactive to light. CN III, IV, VI: No ptosis.  Bilateral INO, horizontal nystagmus to lateral gaze, CN V: Facial sensation is intact to pinprick in all 3 divisions bilaterally. Corneal responses are intact.  CN VII: Face is symmetric with normal eye closure and smile. CN VIII: Hearing is normal to rubbing fingers CN IX, X: Palate elevates symmetrically. Phonation is normal. CN XI: Head turning and shoulder shrug  are intact CN XII: Tongue is midline with normal movements and no atrophy.  MOTOR: Fairly normal right arm in the lower extremity movement, mild left arm weakness, fixation with rapid rotating movement, proximal and distal strength 4+, difficulty extent left fingers after grip, moderate spasticity of left lower extremity, left hip flexion 2, left knee flexion 3, left knee extension 4+, left ankle dorsiflexion 4+, plantarflexion 5,  REFLEXES: Reflexes are 3 and symmetric at the biceps, triceps, knees, and ankles. Plantar responses are flexor on the right and extensor on the left,  SENSORY: Intact to light touch,   COORDINATION: There was no significant dysmetria noted, lack of coordination proportion to weakness.Marland Kitchen    GAIT/STANCE: She needs assistance to get up from seated position, rely on her cane, hyperextended her left leg, spastic dragging her left leg across the floor, tends to hold left arm elbow flexion.  DIAGNOSTIC DATA (LABS, IMAGING, TESTING) - I reviewed patient records, labs, notes, testing and imaging myself where available.   ASSESSMENT AND PLAN  Jenavive Lamboy is a 47 y.o. female    Relapsing Remitting Multiple Sclerosis, left spasticity, lower extremity more than left upper extremity.  We used BOTOX A 600 units today (300 units from speciality pharmacy and 300 units of samples)  Under electrical stimulation 200 units to left upper extremity, 400 units were injected into left lower extremity.  Left pectoralis major 50 units Left brachialis 25 units Left biceps 25 units  Left pronator teres 25 units Left flexor digitorum profundus 25 units Left flexor digitorum superficialis 25 units Left palmaris longus 25 units  Left tibialis anterior 100 units Left flexor digitorum longus 100 units Left vastus lateralis 25 units Left vastus medialis 25 units Left rectus femoris 25 units Left vastus intermedius  25 units  Left adductor magnus 50 units Left adductor longus  50 units  Patient tolerated injection well will return to clinic in 3 months for repeat injection  Levert Feinstein, M.D. Ph.D.  [facility] [address] [place], [place] [address] Ph: [phone] Fax: [phone]  CC: Referring Provider

## 2018-04-22 ENCOUNTER — Ambulatory Visit (HOSPITAL_COMMUNITY)
Admission: RE | Admit: 2018-04-22 | Discharge: 2018-04-22 | Disposition: A | Discharge status: Home / Self Care | Payer: PRIVATE HEALTH INSURANCE | Source: Ambulatory Visit | Attending: Neurology | Admitting: Neurology

## 2018-04-22 ENCOUNTER — Ambulatory Visit (HOSPITAL_COMMUNITY)
Admission: RE | Admit: 2018-04-22 | Discharge: 2018-04-22 | Disposition: A | Discharge status: Home / Self Care | Payer: BLUE CROSS/BLUE SHIELD | Source: Ambulatory Visit | Attending: Neurology | Admitting: Neurology

## 2018-04-22 DIAGNOSIS — R1310 Dysphagia, unspecified: Secondary | ICD-10-CM | POA: Diagnosis not present

## 2018-04-22 DIAGNOSIS — G35 Multiple sclerosis: Secondary | ICD-10-CM

## 2018-04-29 ENCOUNTER — Other Ambulatory Visit: Payer: Self-pay | Admitting: Neurology

## 2018-04-29 ENCOUNTER — Telehealth: Payer: Self-pay | Admitting: *Deleted

## 2018-04-29 DIAGNOSIS — G35 Multiple sclerosis: Secondary | ICD-10-CM

## 2018-04-29 DIAGNOSIS — R1310 Dysphagia, unspecified: Secondary | ICD-10-CM

## 2018-04-29 NOTE — Telephone Encounter (Signed)
Called, LVM for pt to call about results.  

## 2018-04-29 NOTE — Telephone Encounter (Signed)
-----   Message from Asa Lente, MD sent at 04/29/2018 10:23 AM EST ----- The swallow study results show some dysfunction.    Speech therapy recommends "Recommend a Regular diet with nectar liquids, medication given whole in puree"  They also recommend speech therapy as outpatient.    I placed that order.

## 2018-04-30 NOTE — Telephone Encounter (Signed)
Called pt and relayed results for swallow study per Dr. Epimenio Foot note. Pt would like in home speech therapy. Pt also has not heard about scheduling in home therapy that Dr. Epimenio Foot placed back in November. Advised I will call [facility] to see what happened.   I call Bopha with [facility] and she is going to pull referral and add on speech therapy as well. She will message me via epic with update and contact patient.   She is also wondering when she will schedule next botox appt with Dr. Terrace Arabia. Advised I will send Duwayne Heck a message as a reminder. She verbalized understanding and appreciation.

## 2018-05-02 NOTE — Telephone Encounter (Signed)
I called and scheduled the patients botox apt with Dr. Terrace Arabia. DW

## 2018-05-07 ENCOUNTER — Encounter: Payer: Self-pay | Admitting: Neurology

## 2018-05-19 NOTE — Telephone Encounter (Signed)
Called [facility] they have no record of the referral being sent. I have faxed referral over again. The phone number to reach [facility] is [phone] x [phone]. I called the patient to make her aware of this. I gave her their number and extension to check status if she has not heard from them tomorrow.

## 2018-05-22 ENCOUNTER — Telehealth: Payer: Self-pay | Admitting: Neurology

## 2018-05-22 NOTE — Telephone Encounter (Signed)
Daneille- Can you please send her referral to a different place? Thank you!

## 2018-05-22 NOTE — Telephone Encounter (Signed)
Referral has been sent to [facility] due to [facility] turning the patient down. I have sent everything to them via fax.   Annabelle Harman; please double check this to make sure this patient is ok to be sent to [facility].

## 2018-05-22 NOTE — Telephone Encounter (Signed)
Melissa with [facility] called stating due to shorted staffed and the pts location they are unable to see the pt for the nursing needs, please refer elsewhere.

## 2018-05-29 NOTE — Telephone Encounter (Signed)
[facility] will start care 05/29/2018 . Patient is aware.

## 2018-05-29 NOTE — Telephone Encounter (Signed)
I have called and left a message with Elnita Maxwell from   [place]  [phone] .   To see why they have not started her care.

## 2018-05-30 DIAGNOSIS — G8220 Paraplegia, unspecified: Secondary | ICD-10-CM | POA: Diagnosis not present

## 2018-05-30 DIAGNOSIS — R1312 Dysphagia, oropharyngeal phase: Secondary | ICD-10-CM | POA: Diagnosis not present

## 2018-05-30 DIAGNOSIS — G35 Multiple sclerosis: Secondary | ICD-10-CM | POA: Diagnosis not present

## 2018-05-30 DIAGNOSIS — M62838 Other muscle spasm: Secondary | ICD-10-CM | POA: Diagnosis not present

## 2018-06-04 DIAGNOSIS — F341 Dysthymic disorder: Secondary | ICD-10-CM | POA: Diagnosis not present

## 2018-06-04 DIAGNOSIS — G894 Chronic pain syndrome: Secondary | ICD-10-CM | POA: Diagnosis not present

## 2018-06-04 DIAGNOSIS — F419 Anxiety disorder, unspecified: Secondary | ICD-10-CM | POA: Diagnosis not present

## 2018-06-04 DIAGNOSIS — N390 Urinary tract infection, site not specified: Secondary | ICD-10-CM | POA: Diagnosis not present

## 2018-06-05 ENCOUNTER — Telehealth: Payer: Self-pay | Admitting: Neurology

## 2018-06-05 NOTE — Telephone Encounter (Signed)
Speech Therapist Eulogio Bear called in wanting orders for her to start speech therapy with pt concerning her swallowing. Please advise.

## 2018-06-05 NOTE — Telephone Encounter (Signed)
Dr. Epimenio Foot- FYI I called and spoke with Elnita Maxwell. Gave VO to start speech therapy. She verbalized understanding.

## 2018-06-09 ENCOUNTER — Encounter: Payer: Self-pay | Admitting: Neurology

## 2018-06-21 DIAGNOSIS — R1312 Dysphagia, oropharyngeal phase: Secondary | ICD-10-CM | POA: Diagnosis not present

## 2018-07-01 DIAGNOSIS — R928 Other abnormal and inconclusive findings on diagnostic imaging of breast: Secondary | ICD-10-CM | POA: Diagnosis not present

## 2018-07-01 DIAGNOSIS — N6310 Unspecified lump in the right breast, unspecified quadrant: Secondary | ICD-10-CM | POA: Diagnosis not present

## 2018-07-01 DIAGNOSIS — Q2739 Arteriovenous malformation, other site: Secondary | ICD-10-CM | POA: Diagnosis not present

## 2018-07-07 ENCOUNTER — Encounter: Payer: Self-pay | Admitting: Neurology

## 2018-07-09 ENCOUNTER — Encounter: Payer: Self-pay | Admitting: Neurology

## 2018-07-24 ENCOUNTER — Ambulatory Visit: Payer: BLUE CROSS/BLUE SHIELD | Admitting: Neurology

## 2018-07-24 ENCOUNTER — Other Ambulatory Visit: Payer: Self-pay | Admitting: Neurology

## 2018-07-24 NOTE — Telephone Encounter (Signed)
[facility] Verified LR: 06-17-2018 Qty: 21 Pending appointment: 09-18-2018

## 2018-07-28 ENCOUNTER — Telehealth: Payer: Self-pay | Admitting: Neurology

## 2018-07-28 NOTE — Telephone Encounter (Signed)
Copied from CRM [id number]. Topic: Quick Communication - See Telephone Encounter >> Jul 28, 2018  2:01 PM Harlan Stains wrote: Earley Abide from [facility] is calling in to request verbal orders for physical therapy  PT at Home 2week 2 , 1 week 2  CB# [phone]

## 2018-07-28 NOTE — Telephone Encounter (Signed)
I called back and provided VO for OT 1 times per week for 8 weeks per Dr. Epimenio Foot. He verbalized understanding.

## 2018-07-28 NOTE — Telephone Encounter (Signed)
Occupational Therapist Susa Raring has called for verbal orders for 1 time a week for 8 weeks

## 2018-07-28 NOTE — Telephone Encounter (Signed)
Dr. Epimenio Foot- [facility] back and provided VO for home PT 2Wx2 weeks and 1W for 2 weeks per Dr. Epimenio Foot. She verbalized understanding.

## 2018-07-29 DIAGNOSIS — G8220 Paraplegia, unspecified: Secondary | ICD-10-CM | POA: Diagnosis not present

## 2018-07-29 DIAGNOSIS — R1312 Dysphagia, oropharyngeal phase: Secondary | ICD-10-CM | POA: Diagnosis not present

## 2018-07-29 DIAGNOSIS — G35 Multiple sclerosis: Secondary | ICD-10-CM | POA: Diagnosis not present

## 2018-07-29 DIAGNOSIS — M62838 Other muscle spasm: Secondary | ICD-10-CM | POA: Diagnosis not present

## 2018-08-04 ENCOUNTER — Other Ambulatory Visit: Payer: Self-pay | Admitting: Neurology

## 2018-08-04 DIAGNOSIS — G894 Chronic pain syndrome: Secondary | ICD-10-CM | POA: Diagnosis not present

## 2018-08-04 DIAGNOSIS — G8220 Paraplegia, unspecified: Secondary | ICD-10-CM

## 2018-08-04 DIAGNOSIS — G35 Multiple sclerosis: Secondary | ICD-10-CM

## 2018-08-04 DIAGNOSIS — F419 Anxiety disorder, unspecified: Secondary | ICD-10-CM | POA: Diagnosis not present

## 2018-08-04 DIAGNOSIS — F341 Dysthymic disorder: Secondary | ICD-10-CM | POA: Diagnosis not present

## 2018-08-04 MED ORDER — ONABOTULINUMTOXINA 100 UNITS IJ SOLR: 100.0000 [IU] | INTRAMUSCULAR | 3 refills | Status: DC

## 2018-08-04 NOTE — Telephone Encounter (Signed)
I spoke with the pharmacy and since this is such a high dosage it needs to be done electronically and sent to [facility] in [place] @ [address]. Botox 100 unit vials qty of 6. They need the dx listed and with rationale for dose increase.

## 2018-08-04 NOTE — Telephone Encounter (Signed)
Noted, thank you. DW  °

## 2018-08-05 NOTE — Telephone Encounter (Signed)
It is Ok to change to xeomin 600 units

## 2018-08-05 NOTE — Telephone Encounter (Signed)
I called the patient's medical insurance and spoke with Maralyn Sago to check coverage status. [id number], [id number], [id number] and billable npr [id number].

## 2018-08-05 NOTE — Telephone Encounter (Signed)
I called [facility] at [phone] to schedule delivery but it is pending patient consent. They tried calling her but she didn't answer. After requesting the patients copay it seems the patient would be responsible for the full payment. They stated I need to call [employer] to see if could be precerted. I called the [employer] and they stated npr through medical benefits. [id number]. [facility]#[id number].   I called back to the pharmacy and they stated that it is an excluded benefit under th pharmacy benefits and must go through medical benefits. I spoke with Sherril Cong.   This patient's medication must now be B/B. Per Angie in billing, they prefer this large of a dose be Xeomin if possible as opposed to Botox. Could we change this patient to Xeomin Dr. Terrace Arabia? You have request 600 units.

## 2018-08-06 ENCOUNTER — Ambulatory Visit (INDEPENDENT_AMBULATORY_CARE_PROVIDER_SITE_OTHER): Payer: BLUE CROSS/BLUE SHIELD | Admitting: Neurology

## 2018-08-06 ENCOUNTER — Encounter: Payer: Self-pay | Admitting: Neurology

## 2018-08-06 ENCOUNTER — Other Ambulatory Visit: Payer: Self-pay

## 2018-08-06 VITALS — BP 129/89 | HR 83

## 2018-08-06 DIAGNOSIS — G35 Multiple sclerosis: Secondary | ICD-10-CM

## 2018-08-06 DIAGNOSIS — G8220 Paraplegia, unspecified: Secondary | ICD-10-CM

## 2018-08-06 NOTE — Progress Notes (Signed)
PATIENT: Sharon Odom DOB: [birth year]  Chief Complaint  Patient presents with  . MS, Spastic Diplegia    Xeomin 100 units x 6 vials - office supply     HISTORICAL   Sharon Odom is a 48 years old female, accompanied by her husband, referred by Dr. Epimenio Foot for evaluation of botulism toxin injection for spasticity due to multiple sclerosis, this is the first time I evaluated her, also performed injection today on April 18, 2018.  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  She received Lemtrada infusion in April 2017, second dose April 2018, due to continued mild progression of her symptoms, she complete her third Lemtrada infusion in October 2019.  She has been complaining of bilateral lower extremity, left upper extremity spasticity since late 2017, gradually getting worse, has tried different medications, including baclofen up to 120 mg daily, with limited benefit, she is not taking baclofen 80 mg, previously also tried and failed tizanidine, Trileptal, lower dose of botulism toxin a injection in the past only mild temporarily improvement.  She did not ambulate with walker at home, wheelchair for longer distance, complains painful frequent left lower extremity spasm, forceful left lower extremity extension, when she bear weight, she has left toes forceful painful flexion grabbing on the floor, also left shoulder, arm spasm, difficulty release left hand grip.  She was recently started on Valium 5 mg every 6 hours, which  has been helpful.  I personally reviewed the MRI of the brain and cervical spine in September 2019, the MRI of the brain shows multiple T2/FLAIR hyperintense foci in brainstem, cerebellum, left thalamus, hemisphere, consistent with typical chronic demyelinating plaque, no enhancement. MRI of cervical spine showed small T2/flair hyperintensity foci posterior to the left C3-4, several 45 also noted within the brainstem, multilevel mild degenerative changes.  No significant canal stenosis.   She also complains of urinary urgency, frequency, is taking Myrbetriq 50 mg, Wellbutrin 300 mg for depression, mild swallowing difficulty, swallowing evaluation is pending  UPDATE August 06 2018: She had first injection on April 18, 2018, we used Botox a 600 units, 400 units into spastic left lower extremity, 200 into left upper extremity,  Within 3 days, she notice weakness of left 1, 2 fingers, lasted for few weeks, she has difficulty using her left hand, but her left bicep pains has much improved,it also helped her left calf muscle spams.  She noticed increased left leg and shoulder muscle spasm since Jan 10.   REVIEW OF SYSTEMS: Full 14 system review of systems performed and notable only for as above All other review of systems were negative.  ALLERGIES: Allergies  Allergen Reactions  . Morphine Other (See Comments)    HOME MEDICATIONS: Current Outpatient Medications  Medication Sig Dispense Refill  . acetaminophen (TYLENOL) 650 MG CR tablet Take by mouth.    Marland Kitchen acetaminophen-codeine (TYLENOL #3) 300-30 MG tablet TAKE 1 TABLET BY MOUTH EVERY 8 HOURS AS NEEDED FOR MODERATE PAIN 90 tablet 5  . Alemtuzumab (LEMTRADA) 12 MG/1.2ML SOLN Inject 12 mg into the vein.    Marland Kitchen  baclofen (LIORESAL) 20 MG tablet Take up to 6 pills daily for MS spasticity 540 each 3  . botulinum toxin Type A (BOTOX) 100 units SOLR injection Inject 100 Units into the muscle every 3 (three) months. 6 vial 3  . buPROPion (WELLBUTRIN  XL) 300 MG 24 hr tablet Take by mouth.    . Cholecalciferol (VITAMIN D-3) 5000 units TABS Take by mouth.    . Cyanocobalamin (VITAMIN B-12) 2500 MCG SUBL Place under the tongue.    . diazepam (VALIUM) 5 MG tablet Take 1 tablet (5 mg total) by mouth every 6 (six) hours as needed for anxiety. 90 tablet 2  . furosemide (LASIX) 20 MG tablet Take 1 tablet (20 mg total) by mouth daily as needed. 15 tablet 0  . ibuprofen (ADVIL,MOTRIN) 200 MG tablet Take 400 mg by mouth as needed.     . incobotulinumtoxinA (XEOMIN) 100 units SOLR injection Inject 600 Units into the muscle every 3 (three) months.    . Mag Aspart-Potassium Aspart (RA POTASSIUM/MAGNESIUM) 250-250 MG CAPS daily    . Omega-3 Fatty Acids (FISH OIL PO) Take by mouth.    . ondansetron (ZOFRAN) 8 MG tablet as needed With infusions 20 tablet 1  . potassium chloride (MICRO-K) 10 MEQ CR capsule Take by mouth.    . ranitidine (ZANTAC) 300 MG capsule Take by mouth.    . mirabegron ER (MYRBETRIQ) 50 MG TB24 tablet Take by mouth.     No current facility-administered medications for this visit.     PAST MEDICAL HISTORY: Past Medical History:  Diagnosis Date  . Multiple sclerosis (HCC)   . Vision abnormalities     PAST SURGICAL HISTORY: Past Surgical History:  Procedure Laterality Date  . EXCISION MORTON'S NEUROMA Right     FAMILY HISTORY: Family History  Problem Relation Age of Onset  . Diabetes Mother   . Healthy Brother     SOCIAL HISTORY: Social History   Socioeconomic History  . Marital status: Married    Spouse name: Not on file  . Number of children: Not on file  . Years of education: Not on file  . Highest education level: Not on file  Occupational History  . Not on file  Social Needs  . Financial resource strain: Not on file  . Food insecurity:    Worry: Not on file    Inability: Not on file  . Transportation needs:    Medical: Not on file    Non-medical: Not on file  Tobacco Use  . Smoking status: Current  Every Day Smoker  . Smokeless tobacco: Current User  Substance and Sexual Activity  . Alcohol use: Yes    Comment: 2-3 times per wk  . Drug use: Never  . Sexual activity: Not on file  Lifestyle  . Physical activity:    Days per week: Not on file    Minutes per session: Not on file  . Stress: Not on file  Relationships  . Social connections:    Talks on phone: Not on file    Gets together: Not on file    Attends religious service: Not on file    Active member of club or organization: Not on file    Attends meetings of clubs or organizations: Not on file    Relationship status: Not on file  . Intimate partner violence:    Fear of current or ex partner: Not on file    Emotionally abused: Not on file    Physically abused: Not  on file    Forced sexual activity: Not on file  Other Topics Concern  . Not on file  Social History Narrative   Right handed    Caffeine: 1 cup or less per day- coffee   [employer]     PHYSICAL EXAM   Vitals:   08/06/18 1441  BP: 129/89  Pulse: 83    Not recorded      There is no height or weight on file to calculate BMI.  PHYSICAL EXAMNIATION:  Gen: NAD, conversant, well nourised, obese, well groomed                     Cardiovascular: Regular rate rhythm, no peripheral edema, warm, nontender. Eyes: Conjunctivae clear without exudates or hemorrhage Neck: Supple, no carotid bruits. Pulmonary: Clear to auscultation bilaterally   NEUROLOGICAL EXAM:  MENTAL STATUS: Speech: Mild slow spastic speech fluent and spontaneous with normal comprehension.  Cognition:     Orientation to time, place and person     Normal recent and remote memory     Normal Attention span and concentration     Normal Language, naming, repeating,spontaneous speech     Fund of knowledge   CRANIAL NERVES: CN II: Visual fields are full to confrontation.  Pupils are round equal and briskly reactive to light. CN III, IV, VI: No ptosis.  Bilateral INO, horizontal  nystagmus to lateral gaze, CN V: Facial sensation is intact to pinprick in all 3 divisions bilaterally. Corneal responses are intact.  CN VII: Face is symmetric with normal eye closure and smile. CN VIII: Hearing is normal to rubbing fingers CN IX, X: Palate elevates symmetrically. Phonation is normal. CN XI: Head turning and shoulder shrug are intact CN XII: Tongue is midline with normal movements and no atrophy.  MOTOR: Fairly normal right arm in the lower extremity movement, mild left arm weakness, fixation with rapid rotating movement, proximal and distal strength 4+, difficulty extent left fingers after grip, moderate spasticity of left lower extremity, left hip flexion 2, left knee flexion 3, left knee extension 4+, left ankle dorsiflexion 4+, plantarflexion 5,  REFLEXES: Reflexes are 3 and symmetric at the biceps, triceps, knees, and ankles. Plantar responses are flexor on the right and extensor on the left,  SENSORY: Intact to light touch,   COORDINATION: There was no significant dysmetria noted, lack of coordination proportion to weakness.Marland Kitchen    GAIT/STANCE: She needs assistance to get up from seated position, rely on her cane, hyperextended her left leg, spastic dragging her left leg across the floor, tends to hold left arm elbow flexion.  DIAGNOSTIC DATA (LABS, IMAGING, TESTING) - I reviewed patient records, labs, notes, testing and imaging myself where available.   ASSESSMENT AND PLAN  Sharon Odom is a 48 y.o. female    Relapsing Remitting Multiple Sclerosis, left spasticity, lower extremity more than left upper extremity.  We used BOTOX A 600 units today    Under electrical stimulation 300 units to left upper extremity, 300 units were injected into left lower extremity.  Left pectoralis major 100 units Left latissimus dorsi 50 units Left biceps 50 units Left brachialis 100 units   Left tibialis posterior 25 units Left flexor digitorum longus 75 units  Left  vastus lateralis 25 units Left vastus medialis 25 units Left rectus femoris 25 units Left vastus intermedius  25 units Left adductor magnus 50 units Left adductor longus 50 units    Patient tolerated injection well will return to clinic in 3  months for repeat injection  Levert Feinstein, M.D. Ph.D.  [facility] [address] [place], [place] [address] Ph: [phone] Fax: [phone]  CC: Referring Provider

## 2018-08-06 NOTE — Progress Notes (Signed)
**  Xeomin 100 units x 6 vials, NDC 0259-1610-01, Lot [id number], Exp 08/2020, office supply.//[id number],[email or web address]** 

## 2018-08-06 NOTE — Telephone Encounter (Signed)
Spoke with the pharmacy and made them aware. DW

## 2018-08-07 ENCOUNTER — Encounter: Payer: Self-pay | Admitting: Neurology

## 2018-08-08 ENCOUNTER — Telehealth: Payer: Self-pay | Admitting: Neurology

## 2018-08-08 NOTE — Telephone Encounter (Signed)
[email or web address] is asking for a call back to discuss the botulinum toxin Type A (BOTOX) 100 units SOLR injection

## 2018-08-11 DIAGNOSIS — G35 Multiple sclerosis: Secondary | ICD-10-CM

## 2018-08-11 DIAGNOSIS — G8220 Paraplegia, unspecified: Secondary | ICD-10-CM | POA: Diagnosis not present

## 2018-08-11 MED ORDER — INCOBOTULINUMTOXINA 100 UNITS IM SOLR
600.0000 [IU] | INTRAMUSCULAR | Status: DC
  Administered 2018-08-11: 600 [IU] via INTRAMUSCULAR

## 2018-08-11 NOTE — Telephone Encounter (Signed)
Called and spoke to [facility] and relayed Direction of Botox .

## 2018-08-11 NOTE — Telephone Encounter (Signed)
[facility] is callling in needing information clarified and they are requesting a call back   CB# [phone]  Ref # [id number]

## 2018-08-12 DIAGNOSIS — G35 Multiple sclerosis: Secondary | ICD-10-CM | POA: Diagnosis not present

## 2018-08-12 DIAGNOSIS — D3131 Benign neoplasm of right choroid: Secondary | ICD-10-CM | POA: Diagnosis not present

## 2018-08-12 DIAGNOSIS — H5123 Internuclear ophthalmoplegia, bilateral: Secondary | ICD-10-CM | POA: Diagnosis not present

## 2018-08-12 DIAGNOSIS — H2513 Age-related nuclear cataract, bilateral: Secondary | ICD-10-CM | POA: Diagnosis not present

## 2018-08-18 NOTE — Telephone Encounter (Signed)
Noted, thank you. DW  °

## 2018-08-20 DIAGNOSIS — R1312 Dysphagia, oropharyngeal phase: Secondary | ICD-10-CM | POA: Diagnosis not present

## 2018-08-21 DIAGNOSIS — R069 Unspecified abnormalities of breathing: Secondary | ICD-10-CM | POA: Diagnosis not present

## 2018-08-21 DIAGNOSIS — R0600 Dyspnea, unspecified: Secondary | ICD-10-CM | POA: Diagnosis not present

## 2018-08-21 DIAGNOSIS — R0602 Shortness of breath: Secondary | ICD-10-CM | POA: Diagnosis not present

## 2018-08-21 DIAGNOSIS — Z3202 Encounter for pregnancy test, result negative: Secondary | ICD-10-CM | POA: Diagnosis not present

## 2018-08-21 DIAGNOSIS — I700 Atherosclerosis of aorta: Secondary | ICD-10-CM | POA: Diagnosis not present

## 2018-08-21 DIAGNOSIS — I517 Cardiomegaly: Secondary | ICD-10-CM | POA: Diagnosis not present

## 2018-08-21 DIAGNOSIS — R0789 Other chest pain: Secondary | ICD-10-CM | POA: Diagnosis not present

## 2018-08-22 DIAGNOSIS — R0789 Other chest pain: Secondary | ICD-10-CM | POA: Diagnosis not present

## 2018-08-22 DIAGNOSIS — R079 Chest pain, unspecified: Secondary | ICD-10-CM | POA: Diagnosis not present

## 2018-08-22 DIAGNOSIS — R0600 Dyspnea, unspecified: Secondary | ICD-10-CM | POA: Diagnosis not present

## 2018-08-23 DIAGNOSIS — Z9189 Other specified personal risk factors, not elsewhere classified: Secondary | ICD-10-CM | POA: Diagnosis not present

## 2018-08-23 DIAGNOSIS — G35 Multiple sclerosis: Secondary | ICD-10-CM | POA: Diagnosis not present

## 2018-08-23 DIAGNOSIS — N390 Urinary tract infection, site not specified: Secondary | ICD-10-CM | POA: Diagnosis not present

## 2018-08-23 DIAGNOSIS — M6281 Muscle weakness (generalized): Secondary | ICD-10-CM | POA: Diagnosis not present

## 2018-09-10 ENCOUNTER — Encounter: Payer: Self-pay | Admitting: Neurology

## 2018-09-16 ENCOUNTER — Encounter: Payer: Self-pay | Admitting: *Deleted

## 2018-09-16 ENCOUNTER — Other Ambulatory Visit: Payer: Self-pay | Admitting: *Deleted

## 2018-09-16 MED ORDER — SULFAMETHOXAZOLE-TRIMETHOPRIM 800-160 MG PO TABS: ORAL_TABLET | ORAL | 0 refills | Status: DC

## 2018-09-18 ENCOUNTER — Encounter: Payer: Self-pay | Admitting: Neurology

## 2018-09-18 ENCOUNTER — Other Ambulatory Visit: Payer: Self-pay

## 2018-09-18 ENCOUNTER — Ambulatory Visit (INDEPENDENT_AMBULATORY_CARE_PROVIDER_SITE_OTHER): Payer: BLUE CROSS/BLUE SHIELD | Admitting: Neurology

## 2018-09-18 DIAGNOSIS — M62838 Other muscle spasm: Secondary | ICD-10-CM | POA: Diagnosis not present

## 2018-09-18 DIAGNOSIS — F418 Other specified anxiety disorders: Secondary | ICD-10-CM

## 2018-09-18 DIAGNOSIS — G8220 Paraplegia, unspecified: Secondary | ICD-10-CM | POA: Diagnosis not present

## 2018-09-18 DIAGNOSIS — G35 Multiple sclerosis: Secondary | ICD-10-CM | POA: Diagnosis not present

## 2018-09-18 DIAGNOSIS — Z79899 Other long term (current) drug therapy: Secondary | ICD-10-CM | POA: Diagnosis not present

## 2018-09-18 DIAGNOSIS — H532 Diplopia: Secondary | ICD-10-CM

## 2018-09-18 DIAGNOSIS — R269 Unspecified abnormalities of gait and mobility: Secondary | ICD-10-CM

## 2018-09-18 MED ORDER — PREGABALIN 75 MG PO CAPS: ORAL_CAPSULE | ORAL | 0 refills | Status: DC

## 2018-09-18 MED ORDER — CYCLOBENZAPRINE HCL 5 MG PO TABS: 5.0000 mg | ORAL_TABLET | Freq: Three times a day (TID) | ORAL | 5 refills | Status: DC | PRN

## 2018-09-18 MED ORDER — PREGABALIN 75 MG PO CAPS: ORAL_CAPSULE | ORAL | 5 refills | Status: DC

## 2018-09-18 MED ORDER — DALFAMPRIDINE ER 10 MG PO TB12: ORAL_TABLET | ORAL | 11 refills | Status: DC

## 2018-09-18 NOTE — Progress Notes (Signed)
[facility]  PATIENT: Sharon Odom DOB: 03-19-1971  REFERRING DOCTOR OR PCP:  Claretta Fraise SOURCE: Patient, notes imaging and lab reports, MRI images on CD from neurology,  _________________________________   HISTORICAL   CHIEF COMPLAINT:  Chief Complaint  Patient presents with   Multiple Sclerosis    On Lemtrada   Other    Spasticity getting Botox inj    HISTORY OF PRESENT ILLNESS: Sharon Odom is a 48 y.o. woman with multiple sclerosis, left greater than right spasticity and poor gait.  Update 09/18/2018: Virtual Visit via Video Note I connected with Sharon Odom  on 09/18/18 at 10:00 AM EDT by a video enabled telemedicine application and verified that I am speaking with the correct person.  I discussed the limitations of evaluation and management by telemedicine and the availability of in person appointments. The patient expressed understanding and agreed to proceed.  History of Present Illness: She had her third year of [place] in October 2019.  She tolerated it well.  CD4 count was 66 December 2019.  Most recent lymphocyte count is 0.6.  We had a conversation about COVID and [place].  As her lymphocytes are recovering, her risk is probably just slightly worse than it would be otherwise.  However, if the pandemic is still ongoing in a significant way in the late fall, we may want to push back any additional dosing.  She feels her MS is doing about the same with no new exacerbations though she has continued impairments, left > right.    She has severe spasticity and poor gait.     She had Botox 08/06/2018 (Dr. Terrace Arabia) and she feel she got a good benefit at first with almost no spasms for a few days.   She is noting less stiffness in her thigh.  However, the spasticity in her lower leg and foot is not better. Calf still has severe spasticity.  She had 400 Units in the leg and 200 units in the arm.   She did not get a benefit in the arm.    She is having more  pain in the left arm and leg, worse while sitting than standing.    Baclofen 120 mg daily is not helping.   She is also taking valium 10 mg -- this has helped anxiety more than spasticity. Her spasticity seemed to worsen a couple years ago.    She has dysesthesias, left > right.  Oxcarbaxepine and lamotrigine were poorly tolerated.  She has more fatigue and sleepiness.     She went to the ED 08/21/2018 due to shortness of breath and she was felt to be having severe seasonal allergies.    Observations/Objective:  She is a well-developed well-nourished woman in no acute distress.  The head is normocephalic and atraumatic.  Sclera are anicteric.  Visible skin appears normal.  The neck has a good range of motion.  Pharynx and tongue have normal appearance.  She is alert and fully oriented with fluent speech and good attention, knowledge and memory.  She has right greater than left INO's.  Facial strength is normal.  Palatal elevation and tongue protrusion are midline.  She appears to have normal strength in the right arm but mildly reduced strength of the left arm and reduced rapid alternating movements and finger-nose-finger on the left.  Assessment and Plan: Multiple sclerosis (HCC)  Spastic diplegia, acquired, lower extremity (HCC)  High risk medication use  Muscle spasticity  Gait disturbance  Depression with anxiety  Diplopia  1.  Hopefully we will be able to defer additional Lemtrada dosing.  She will continue with the REMS program. 2.  Her main problem has been left-sided spasticity.  She has received some benefit from the Xeomin.  She used 600 units last time without a lot of benefit in the thigh but little benefit in the arm or lower leg.  I will send a task to Dr. Chauncey Reading to consider a higher dose as 2 limbs are being performed.  He had some benefit from Flexeril in the past and I will add that. 3.  For her  Dysesthetic pain I will have her try Lyrica.  She had not been able to  tolerate lamotrigine and oxcarbazepine and did not get much benefit from gabapentin. 4.   Ampyra generic 10 mg twice daily.  We have had a lot of difficulty getting insurance to cover this.  I will have her try using the good Rx card 5.   She will return to see me in 6 months or sooner if there are new or worsening neurologic symptoms.  Follow Up Instructions: I discussed the assessment and treatment plan with the patient. The patient was provided an opportunity to ask questions and all were answered. The patient agreed with the plan and demonstrated an understanding of the instructions.    The patient was advised to call back or seek an in-person evaluation if the symptoms worsen or if the condition fails to improve as anticipated.  I provided 45 minutes of non-face-to-face time during this encounter.  __________________________ From prior visits: Update 04/14/2018: During REMS testing her creatinine level was elevated at 1.41 (it was 0.91 1 month earlier).   Of note, she reports that her creatinine has fluctuated over the years and that there was one other time when she was felt to have a high reading that was felt to be due to dehydration.  She has noted decreased oral liquid intake at times due to dysphasia.   We discussed possibility of an autoimmune kidney disease as that can be seen at about 1: 400 Lemtrada patients  Spasticity is her main issue.    The left leg, especially, spasms up with movements.   Botox had not had much of a benefit on the left and maybe a 25% benefit in the milder right.    Spasms are very painful.    She also has spasticity in the left arm.    Pain feels like the arm is being held tightly by someone.  The left leg does worse with activity so she can't exercise with it.     She has done PT in the past but it has not helped much.    She is having more trouble with basic ADL.   She got the [facility] but it did not help that much.      She tires out easily with any  activity.      She notes more trouble with swallowing liquids and is drinking less.     The problem is initiating the swallowing.     Hr sister notes that she seems to be having some hearing loss as she turns up TV louder and misses what people say at times.    Her anxiety is worse and she is back on Xanax.     More spasticity in the left arm, especially after squeezing, she can't easily straighten the fingers back up  Update 01/23/2018: Her main problem continues to be spasticity in the legs,  left greater than right.  Associated with the spasticity she has quite a bit of pain.  She felt she received benefit from the Botox injections into the legs for a few weeks but then the spasticity returned to the same level.  We discussed using a larger dose today and will consider increasing the dose further based on her response.  She is able to tolerate the baclofen better and has been able to increase the dose.  Gait is poor.  Ampyra seems to be helping a little bit.  She had 2 years of Lemtrada therapy, with the last dose about 15 months ago.  We are in the process of trying to get an additional year of [place] approved.    Update 10/16/2017: After the last visit, the baclofen dose was increased but she had trouble tolerating higher doses and did not think that her spasticity improved any.  She continues to report a lot of spasticity in both legs, left greater than right and in the left arm.  She denies spasticity in the right arm.  There continues to phasic spasms superimposed on the tonic spasms predominantly involving both feet and the left leg.  Gait is poor.  Around the home she will use a walker and can get from one room to another.   At the last visit I also started lamotrigine for her dysesthesias but it was poorly tolerated and she stopped.  Previously she had been unable to tolerate oxcarbazepine.  She has not yet restarted the Ampyra.  In the past, she had received Botox injections into muscles  of the left lower leg and both feet.  This helped quite a bit with the first series but less so with the second series.  Since she has moved up to this area, she has not had any more Botox injections.  She had her first course of Lemtrada April 2017 and the second course of April 2018.  She has noted some further progression over the last year and we have discussed an additional third course.  She had shingles earlier this year.  Pain has resolved.  From 08/15/2017:  She was diagnosed in October 2013 due to difficulty with her handwriting and right hand coordination.   A few weeks later she had gait ataxia and numbness and was referred to MS.   She had an MRI of the brain (was in [place] at the time) and then had a LP confirming the diagnosis.  Initially, she was placed on Copaxone but had severe frequent exacerbations and then went to Tecfidera due to breakthrough exacerbations.  She was referred to [facility] for Tysabri (Feb 2014 to November 2016).  She then moved to [facility] and saw Dr. Ilene Qua [facility] [phone]).   She was always JCV Ab titer positive.    She did well on it but stopped 03/2015 due to safety risk.  She had more spasticity early 2017 that improved with 5 days IV Solumedrol and then started [facility] April 2017.   The infusion went well.    She started having more spasms in her legs later in 2017 and had Botox injections into her feet (some benefit but only for 3-4 weeks).   She had a second course of Botox November 2017 without much benefit.     She had numbness and tingling in her left arm November 2017 and was given an ESI.   Medical MJ did not help.   She had her second course of Lemtrada in April 2018 but  the steroid did not help her spasticity as it did the first year.    [facility] and also saw Dr. Aquilla HackerGhobrial at [facility].  Currently,   She uses a walker (rarely cane) around her home and is wheeled outside the house.    Earlier this year, she had worsening spasticity with toes very spastic and legs locking up.    The severe leg spasms have improved but she still has spasms in her feet that are severe and painful.   Spasticity is worse on the left (foot, leg and arm) and only in her foot on the right.    She takes 20 mg po qid.   Tizanidine did not help much.   She has some painful dysesthesia but gabapentin had not helped.   She continues to have a lot of pain, worse on her left.     Her left arm is also weak and she can't do many self care tasks (dressing, etc).    Due to the pain, she was drinking alcohol (2 - 3 drinks).   Clonazepam was tried but doses were low.   She was on Ampyra in the past 2014-2017 but felt it wasn't working and stopped.   She was on both Ampyra and Wellbutrin for at least one year and there were no seizures.     She has urinary frequency and urgency, helped by Myrbetriq.   Sometimes due to her gait, she cannot get to the bathroom in time.    She had diplopia during 1 of her exacerbations a few years ago.  She continues to have some double vision though she is doing better now.    She never had optic neuritis.    Wellbutrin helped her mood better initially and she is very frustrated and irritable.   She was diagnosed with PBA in May 2018 and started on Nuedexta.   She did better mood-wise but spasticity was worse and she stopped.       She feels cognitive skills are mildly worse with mild short term memory issues and decreased focus and attention.     I have reviewed the MRI of the brain and cervical spine dated 03/19/2017.  The MRI of the brain shows multiple T2/FLAIR hyperintense foci in the cerebellum, pons, left greater than right thalamus and in the periventricular, juxtacortical and deep white matter of both hemispheres.    The MRI of the cervical spine showed a subtle focus Adjacent to C4-C5.  She has a disc [facility].  REVIEW OF SYSTEMS: Constitutional: No fevers, chills, sweats,  or change in appetite.  She reports fatigue. Eyes: As above Ear, nose and throat: No hearing loss, ear pain, nasal congestion, sore throat Cardiovascular: No chest pain, palpitations Respiratory: No shortness of breath at rest or with exertion.   No wheezes GastrointestinaI: No nausea, vomiting, diarrhea, abdominal pain, fecal incontinence Genitourinary:As above  musculoskeletal:She has pain in her legs, left greater than right due to spasticity.  She notes mild back pain. Integumentary: No rash, pruritus, skin lesions Neurological: as above Psychiatric: As above Endocrine: No palpitations, diaphoresis, change in appetite, change in weigh or increased thirst Hematologic/Lymphatic: No anemia, purpura, petechiae. Allergic/Immunologic: No itchy/runny eyes, nasal congestion, recent allergic reactions, rashes  ALLERGIES: Allergies  Allergen Reactions   Morphine Other (See Comments)    HOME MEDICATIONS:  Current Outpatient Medications:    acetaminophen (TYLENOL) 650 MG CR tablet, Take 1,300 mg by mouth. ,  Disp: , Rfl:    acetaminophen-codeine (TYLENOL #3) 300-30 MG tablet, TAKE 1 TABLET BY MOUTH EVERY 8 HOURS AS NEEDED FOR MODERATE PAIN (Patient taking differently: Take 2 tablets by mouth every 8 (eight) hours as needed. ), Disp: 90 tablet, Rfl: 5   albuterol (PROVENTIL) (2.5 MG/3ML) 0.083% nebulizer solution, Take 2.5 mg by nebulization every 6 (six) hours as needed for wheezing or shortness of breath., Disp: , Rfl:    albuterol (VENTOLIN HFA) 108 (90 Base) MCG/ACT inhaler, Inhale into the lungs every 6 (six) hours as needed for wheezing or shortness of breath., Disp: , Rfl:    Alemtuzumab (LEMTRADA) 12 MG/1.2ML SOLN, Inject 12 mg into the vein., Disp: , Rfl:    baclofen (LIORESAL) 20 MG tablet, Take up to 6 pills daily for MS spasticity, Disp: 540 each, Rfl: 3   botulinum toxin Type A (BOTOX) 100 units SOLR injection, Inject 100 Units into the muscle every 3 (three) months.,  Disp: 6 vial, Rfl: 3   buPROPion (WELLBUTRIN XL) 300 MG 24 hr tablet, Take by mouth., Disp: , Rfl:    Cholecalciferol (VITAMIN D-3) 5000 units TABS, Take by mouth., Disp: , Rfl:    Cyanocobalamin (VITAMIN B-12) 2500 MCG SUBL, Place under the tongue., Disp: , Rfl:    cyclobenzaprine (FLEXERIL) 5 MG tablet, Take 1 tablet (5 mg total) by mouth 3 (three) times daily as needed for muscle spasms., Disp: 90 tablet, Rfl: 5   dalfampridine 10 MG TB12, One po bid, Disp: 60 tablet, Rfl: 11   diazepam (VALIUM) 5 MG tablet, Take 1 tablet (5 mg total) by mouth every 6 (six) hours as needed for anxiety. (Patient taking differently: Take 10 mg by mouth every 6 (six) hours as needed for anxiety. ), Disp: 90 tablet, Rfl: 2   furosemide (LASIX) 20 MG tablet, Take 20 mg by mouth., Disp: , Rfl:    ibuprofen (ADVIL,MOTRIN) 200 MG tablet, Take 400 mg by mouth as needed. , Disp: , Rfl:    incobotulinumtoxinA (XEOMIN) 100 units SOLR injection, Inject 600 Units into the muscle every 3 (three) months., Disp: , Rfl:    Mag Aspart-Potassium Aspart (RA POTASSIUM/MAGNESIUM) 250-250 MG CAPS, Take 500 mg by mouth. , Disp: , Rfl:    mirabegron ER (MYRBETRIQ) 50 MG TB24 tablet, Take by mouth., Disp: , Rfl:    montelukast (SINGULAIR) 10 MG tablet, Take 10 mg by mouth at bedtime., Disp: , Rfl:    nitrofurantoin (MACRODANTIN) 100 MG capsule, Take 100 mg by mouth 2 (two) times daily., Disp: , Rfl:    ondansetron (ZOFRAN) 8 MG tablet, as needed With infusions, Disp: 20 tablet, Rfl: 1   pregabalin (LYRICA) 75 MG capsule, One po qAM and two po qHS, Disp: 90 capsule, Rfl: 5   ranitidine (ZANTAC) 300 MG capsule, Take 300 mg by mouth as needed. , Disp: , Rfl:    sulfamethoxazole-trimethoprim (BACTRIM DS) 800-160 MG tablet, Take 1 tablet twice daily for 1 week, Disp: 14 tablet, Rfl: 0  Current Facility-Administered Medications:    incobotulinumtoxinA (XEOMIN) 100 units injection 600 Units, 600 Units, Intramuscular, Q90  days, Levert Feinstein, MD, 600 Units at 08/11/18 0816  PAST MEDICAL HISTORY: Past Medical History:  Diagnosis Date   Multiple sclerosis (HCC)    Vision abnormalities     PAST SURGICAL HISTORY:   FAMILY HISTORY: Family History  Problem Relation Age of Onset   Diabetes Mother    Healthy Brother     SOCIAL HISTORY:  Social History   Socioeconomic  History   Marital status: Married    Spouse name: Not on file   Number of children: Not on file   Years of education: Not on file   Highest education level: Not on file  Occupational History   Not on file  Social Needs   Financial resource strain: Not on file   Food insecurity:    Worry: Not on file    Inability: Not on file   Transportation needs:    Medical: Not on file    Non-medical: Not on file  Tobacco Use   Smoking status: Current Every Day Smoker   Smokeless tobacco: Current User  Substance and Sexual Activity   Alcohol use: Yes    Comment: 2-3 times per wk   Drug use: Never   Sexual activity: Not on file  Lifestyle   Physical activity:    Days per week: Not on file    Minutes per session: Not on file   Stress: Not on file  Relationships   Social connections:    Talks on phone: Not on file    Gets together: Not on file    Attends religious service: Not on file    Active member of club or organization: Not on file    Attends meetings of clubs or organizations: Not on file    Relationship status: Not on file   Intimate partner violence:    Fear of current or ex partner: Not on file    Emotionally abused: Not on file    Physically abused: Not on file    Forced sexual activity: Not on file  Other Topics Concern   Not on file  Social History Narrative   Right handed    Caffeine: 1 cup or less per day- coffee   [employer]     PHYSICAL EXAM  There were no vitals filed for this visit.  There is no height or weight on file to calculate BMI.   General: The patient is well-developed  and well-nourished and in no acute distress  Neurologic Exam  Mental status: The patient is alert and oriented x 3 at the time of the examination. The patient has apparent normal recent and remote memory, with an apparently normal attention span and concentration ability.   Speech is normal.  Cranial nerves: Bilateral INO's, right greater than left.  Facial strength and sensation is normal.  The tongue is midline, and the patient has symmetric elevation of the soft palate. No obvious hearing deficits are noted.  Motor:   Making a fist triggered flexion spasms in the left hand/fingers.  After standing for about 20 to 30 seconds, she had flexion spasms of the left foot.  Muscle bulk is normal.  There is increased muscle tone in the legs, left greater than right.  She has moderate increased muscle tone in the left arm.  The right arm has normal muscle tone.  Strength is 4to 4+/5 in the left arm, 3-4-/5 in the left leg and 4- to 4/5 in the right leg .     Sensory: Normal sensation to touch and vibration in the arms.  She has reduced sensation to vibration in the left leg.  Coordination: Cerebellar testing reveals good right finger-nose-finger and reduced left finger-nose-finger.  Right heel-to-shin is poor.  She cannot do heel-to-shin on the left.  Gait and station: She needs support to stand.  Her gait is spastic and needs bilateral support.  She cannot do a tandem walk.  She is walking  faster since starting the Ampyra Romberg is positive.  Reflexes: Deep tendon reflexes are increased in the legs with spread at the knees and clonus at the ankles, nonsustained bilaterally but more on the left.   plantar responses are flexor.  25 foot timed walk off Ampyra was 58.2 sec (average of 2 trials).       DIAGNOSTIC DATA (LABS, IMAGING, TESTING) - I reviewed patient records, labs, notes, testing and imaging myself where available.     ASSESSMENT AND PLAN  Multiple sclerosis (HCC)  Spastic  diplegia, acquired, lower extremity (HCC)  High risk medication use  Muscle spasticity  Gait disturbance  Depression with anxiety  Diplopia      Sharon Odom A. Epimenio Foot, MD, [facility] 09/18/2018, 12:41 PM Certified in Neurology, Clinical Neurophysiology, Sleep Medicine, Pain Medicine and Neuroimaging  [facility] [address], [address] [place], [place] [address] [phone]

## 2018-09-18 NOTE — Telephone Encounter (Signed)
Per [employer], I completed a PA for lyrica via covermymeds. Key: [id number] - PA Case ID: [id number] Sent to [employer]. Should have a determination in 1-3 business days.  I spoke with Dr. Epimenio Foot, he will send in a one month supply of lyrica to [facility], per pt request.  Of note, 90 capsules of lyrica 75mg  is $20.84 at [employer].

## 2018-09-19 DIAGNOSIS — J9801 Acute bronchospasm: Secondary | ICD-10-CM | POA: Diagnosis not present

## 2018-09-22 DIAGNOSIS — J9801 Acute bronchospasm: Secondary | ICD-10-CM | POA: Diagnosis not present

## 2018-09-22 DIAGNOSIS — G35 Multiple sclerosis: Secondary | ICD-10-CM | POA: Diagnosis not present

## 2018-09-22 DIAGNOSIS — G894 Chronic pain syndrome: Secondary | ICD-10-CM | POA: Diagnosis not present

## 2018-09-22 DIAGNOSIS — N390 Urinary tract infection, site not specified: Secondary | ICD-10-CM | POA: Diagnosis not present

## 2018-10-07 ENCOUNTER — Telehealth: Payer: Self-pay | Admitting: Neurology

## 2018-10-07 NOTE — Telephone Encounter (Signed)
Pt has scheduled.

## 2018-10-07 NOTE — Telephone Encounter (Signed)
LVM for patient to call back about scheduling her 6 month follow up with Dr. Epimenio Foot.

## 2018-10-07 NOTE — Telephone Encounter (Signed)
Sharon Odom - do you mind calling patient and scheduling the 6 month f/u? She did a VV on 09/18/18 and he wanted her to follow up in 6 months

## 2018-10-07 NOTE — Telephone Encounter (Signed)
I am note sure- can you call and speak with her? I would appreciate it!

## 2018-10-07 NOTE — Telephone Encounter (Signed)
Appointment Request From: Donita Brooks    With Provider: Asa Lente, MD [[facility]]    Preferred Date Range: 03/20/2019 - 03/24/2019    Preferred Times: Any time    Reason for visit: Request an Appointment    Comments:  Cervical/Brain Mri, TX Options for MS, Bilateral spasticity, liquids swallowing difficulty, baclofen pump

## 2018-10-07 NOTE — Telephone Encounter (Signed)
Noted, thank you

## 2018-10-07 NOTE — Telephone Encounter (Signed)
Is she suppose to have MRI's?

## 2018-10-15 ENCOUNTER — Other Ambulatory Visit: Payer: Self-pay | Admitting: Neurology

## 2018-10-15 MED ORDER — CYCLOBENZAPRINE HCL 10 MG PO TABS: 5.0000 mg | ORAL_TABLET | Freq: Three times a day (TID) | ORAL | 5 refills | Status: DC

## 2018-10-15 MED ORDER — PREGABALIN 75 MG PO CAPS: ORAL_CAPSULE | ORAL | 5 refills | Status: DC

## 2018-10-16 ENCOUNTER — Telehealth: Payer: Self-pay | Admitting: Neurology

## 2018-10-16 NOTE — Telephone Encounter (Signed)
Sater, [name], MD  Levert Feinstein, MD; Sharon Odom,  She is a patient of mine with MS who had Botox injection by you about 3 months ago. She does note some improvement (a lot of improvement in the thigh, some improvement in the arm but not much improvement in the foot) she had a total of 600 units with 400 units in the leg and 200 units in the arm. She would like to know if she could be injected with a higher number of units when she sees you back in early June.   Richard      D[place]:  Can you try to talk with the insurance company to see what is the maximum dose of BOTOX for her?

## 2018-10-21 NOTE — Telephone Encounter (Signed)
Ok. Thank you.

## 2018-10-21 NOTE — Telephone Encounter (Signed)
600 is the max dosage, you could do a p2p to request an exception if you would like, or we can use samples. Please advise.

## 2018-10-22 NOTE — Telephone Encounter (Signed)
Noted, thank you. DW  °

## 2018-10-23 ENCOUNTER — Other Ambulatory Visit: Payer: Self-pay | Admitting: Neurology

## 2018-10-23 MED ORDER — CYCLOBENZAPRINE HCL 10 MG PO TABS: 10.0000 mg | ORAL_TABLET | Freq: Three times a day (TID) | ORAL | 5 refills | Status: DC

## 2018-10-24 DIAGNOSIS — N390 Urinary tract infection, site not specified: Secondary | ICD-10-CM | POA: Diagnosis not present

## 2018-10-24 DIAGNOSIS — G894 Chronic pain syndrome: Secondary | ICD-10-CM | POA: Diagnosis not present

## 2018-10-24 DIAGNOSIS — M6281 Muscle weakness (generalized): Secondary | ICD-10-CM | POA: Diagnosis not present

## 2018-10-24 DIAGNOSIS — Z7189 Other specified counseling: Secondary | ICD-10-CM | POA: Diagnosis not present

## 2018-10-24 DIAGNOSIS — Z9189 Other specified personal risk factors, not elsewhere classified: Secondary | ICD-10-CM | POA: Diagnosis not present

## 2018-10-30 ENCOUNTER — Other Ambulatory Visit: Payer: Self-pay | Admitting: Neurology

## 2018-10-30 MED ORDER — PREGABALIN 150 MG PO CAPS: ORAL_CAPSULE | ORAL | 5 refills | Status: DC

## 2018-11-03 ENCOUNTER — Telehealth: Payer: Self-pay | Admitting: Neurology

## 2018-11-03 NOTE — Telephone Encounter (Signed)
I called and left the patient a VM asking her to call the SP at [phone].

## 2018-11-04 DIAGNOSIS — Z9189 Other specified personal risk factors, not elsewhere classified: Secondary | ICD-10-CM | POA: Diagnosis not present

## 2018-11-04 DIAGNOSIS — G35 Multiple sclerosis: Secondary | ICD-10-CM | POA: Diagnosis not present

## 2018-11-04 DIAGNOSIS — M6281 Muscle weakness (generalized): Secondary | ICD-10-CM | POA: Diagnosis not present

## 2018-11-04 DIAGNOSIS — N390 Urinary tract infection, site not specified: Secondary | ICD-10-CM | POA: Diagnosis not present

## 2018-11-04 DIAGNOSIS — G8220 Paraplegia, unspecified: Secondary | ICD-10-CM | POA: Diagnosis not present

## 2018-11-04 DIAGNOSIS — G894 Chronic pain syndrome: Secondary | ICD-10-CM | POA: Diagnosis not present

## 2018-11-04 NOTE — Telephone Encounter (Signed)
I called the patient to remind her of her apt tomorrow, I left a VM with check in details. DW

## 2018-11-05 ENCOUNTER — Ambulatory Visit (INDEPENDENT_AMBULATORY_CARE_PROVIDER_SITE_OTHER): Payer: BC Managed Care – PPO | Admitting: Neurology

## 2018-11-05 ENCOUNTER — Other Ambulatory Visit: Payer: Self-pay

## 2018-11-05 ENCOUNTER — Encounter: Payer: Self-pay | Admitting: Neurology

## 2018-11-05 VITALS — BP 124/78 | HR 101 | Temp 98.4°F

## 2018-11-05 DIAGNOSIS — G8220 Paraplegia, unspecified: Secondary | ICD-10-CM

## 2018-11-05 DIAGNOSIS — G35 Multiple sclerosis: Secondary | ICD-10-CM

## 2018-11-05 NOTE — Progress Notes (Signed)
PATIENT: Sharon Odom DOB: [birth year]  Chief Complaint  Patient presents with  . MS, Spastic Diplegia    Botox 200 units x 1 vial - sample medication     HISTORICAL   Sharon BrooksChristy Hatton is a 48 years old female, accompanied by her husband, referred by Dr. Epimenio FootSater for evaluation of botulism toxin injection for spasticity due to multiple sclerosis, this is the first time I evaluated her, also performed injection today on April 18, 2018.  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  She received Lemtrada infusion in April 2017, second dose April 2018, due to continued mild progression of her symptoms, she complete her third Lemtrada infusion in October 2019.  She has been complaining of bilateral lower extremity, left upper extremity spasticity since late 2017, gradually getting worse, has tried different medications, including baclofen up to 120 mg daily, with limited benefit, she is not taking baclofen 80 mg, previously also tried and failed tizanidine, Trileptal, lower dose of botulism toxin a injection in the past only mild temporarily improvement.  She did not ambulate with walker at home, wheelchair for longer distance, complains painful frequent left lower extremity spasm, forceful left lower extremity extension, when she bear weight, she has left toes forceful painful flexion grabbing on the floor, also left shoulder, arm spasm, difficulty release left hand grip.  She was recently started on Valium 5 mg every 6 hours,  which has been helpful.  I personally reviewed the MRI of the brain and cervical spine in September 2019, the MRI of the brain shows multiple T2/FLAIR hyperintense foci in brainstem, cerebellum, left thalamus, hemisphere, consistent with typical chronic demyelinating plaque, no enhancement. MRI of cervical spine showed small T2/flair hyperintensity foci posterior to the left C3-4, several 45 also noted within the brainstem, multilevel mild degenerative changes.  No significant canal stenosis.   She also complains of urinary urgency, frequency, is taking Myrbetriq 50 mg, Wellbutrin 300 mg for depression, mild swallowing difficulty, swallowing evaluation is pending  UPDATE August 06 2018: She had first injection on April 18, 2018, we used Botox a 600 units, 400 units into spastic left lower extremity, 200 into left upper extremity,  Within 3 days, she notice weakness of left 1, 2 fingers, lasted for few weeks, she has difficulty using her left hand, but her left bicep pains has much improved,it also helped her left calf muscle spams.  She noticed increased left leg and shoulder muscle spasm since Jan 10.  UPDATE November 05 2018: She responded well to previous injection, reported 80% improvement of her left thigh muscle spasm, 50% improvement of her left shoulder, proximal arm muscle spasm, but the benefit of short lasting, she continue have bilateral toe flexion, painful, left worse than right, there was no improvement in her left calf muscle spasm, and pain  REVIEW OF SYSTEMS: Full 14 system review of systems performed and notable only for as above All other review of systems were negative.  ALLERGIES: Allergies  Allergen Reactions  . Morphine Other (See Comments)    HOME MEDICATIONS:  Current Outpatient Medications  Medication Sig Dispense Refill  . acetaminophen (TYLENOL) 650 MG CR tablet Take 1,300 mg by mouth.     Marland Kitchen acetaminophen-codeine (TYLENOL #3) 300-30 MG tablet TAKE 1 TABLET BY  MOUTH EVERY 8 HOURS AS NEEDED FOR MODERATE PAIN (Patient taking differently: Take 2 tablets by mouth every 8 (eight) hours as needed. ) 90 tablet 5  . albuterol (PROVENTIL) (2.5 MG/3ML) 0.083% nebulizer solution Take 2.5 mg by nebulization every 6 (six) hours as needed for wheezing or shortness of breath.    Marland Kitchen albuterol (VENTOLIN HFA) 108 (90 Base) MCG/ACT inhaler Inhale into the lungs every 6 (six) hours as needed for wheezing or shortness of breath.    . Alemtuzumab (LEMTRADA) 12 MG/1.2ML SOLN Inject 12 mg into the vein.    . baclofen (LIORESAL) 20 MG tablet Take up to 6 pills daily for MS spasticity 540 each 3  . botulinum toxin Type A (BOTOX) 100 units SOLR injection Inject 100 Units into the muscle every 3 (three) months. (Patient taking differently: Inject 200 Units into the muscle every 3 (three) months. ) 6 vial 3  . buPROPion (WELLBUTRIN XL) 300 MG 24 hr tablet Take by mouth.    . Cholecalciferol (VITAMIN D-3) 5000 units TABS Take by mouth.    . Cyanocobalamin (VITAMIN B-12) 2500 MCG SUBL Place under the tongue.    . cyclobenzaprine (FLEXERIL) 10 MG tablet Take 1 tablet (10 mg total) by mouth 3 (three) times daily. 90 tablet 5  . dalfampridine 10 MG TB12 One po bid 60 tablet 11  . diazepam (VALIUM) 5 MG tablet Take 1 tablet (5 mg total) by mouth every 6 (six) hours as needed for anxiety. (Patient taking differently: Take 10 mg by mouth every 6 (six) hours as needed for anxiety. ) 90 tablet 2  . furosemide (LASIX) 20 MG tablet Take 20 mg by mouth.    Marland Kitchen ibuprofen (ADVIL,MOTRIN) 200 MG tablet Take 400 mg by mouth as needed.     . incobotulinumtoxinA (XEOMIN) 100 units SOLR injection Inject 600 Units into the muscle every 3 (three) months.    Jodelle Green Aspart-Potassium Aspart (RA POTASSIUM/MAGNESIUM) 250-250 MG CAPS Take 500 mg by mouth.     . montelukast (SINGULAIR) 10 MG tablet Take 10 mg by mouth at bedtime.    . nitrofurantoin (MACRODANTIN) 100 MG capsule Take 100 mg by mouth 2 (two) times  daily.    . ondansetron (ZOFRAN) 8 MG tablet as needed With infusions 20 tablet 1  . pregabalin (LYRICA) 150 MG capsule One po bid 60 capsule 5  . ranitidine (ZANTAC) 300 MG capsule Take 300 mg by mouth as needed.     . sulfamethoxazole-trimethoprim (BACTRIM DS) 800-160 MG tablet Take 1 tablet twice daily for 1 week 14 tablet 0  . mirabegron ER (MYRBETRIQ) 50 MG TB24 tablet Take by mouth.     No current facility-administered medications for this visit.     PAST MEDICAL HISTORY: Past Medical History:  Diagnosis Date  . Multiple sclerosis (HCC)   . Vision abnormalities     PAST SURGICAL HISTORY: Past Surgical History:  Procedure Laterality Date  . EXCISION MORTON'S NEUROMA Right     FAMILY HISTORY: Family History  Problem Relation Age of Onset  . Diabetes Mother   . Healthy Brother     SOCIAL HISTORY: Social History   Socioeconomic History  . Marital status: Married    Spouse name: Not on file  . Number of children: Not on file  .  Years of education: Not on file  . Highest education level: Not on file  Occupational History  . Not on file  Social Needs  . Financial resource strain: Not on file  . Food insecurity:    Worry: Not on file    Inability: Not on file  . Transportation needs:    Medical: Not on file    Non-medical: Not on file  Tobacco Use  . Smoking status: Current Every Day Smoker  . Smokeless tobacco: Current User  Substance and Sexual Activity  . Alcohol use: Yes    Comment: 2-3 times per wk  . Drug use: Never  . Sexual activity: Not on file  Lifestyle  . Physical activity:    Days per week: Not on file    Minutes per session: Not on file  . Stress: Not on file  Relationships  . Social connections:    Talks on phone: Not on file    Gets together: Not on file    Attends religious service: Not on file    Active member of club or organization: Not on file    Attends meetings of clubs or organizations: Not on file    Relationship status: Not  on file  . Intimate partner violence:    Fear of current or ex partner: Not on file    Emotionally abused: Not on file    Physically abused: Not on file    Forced sexual activity: Not on file  Other Topics Concern  . Not on file  Social History Narrative   Right handed    Caffeine: 1 cup or less per day- coffee   [employer]     PHYSICAL EXAM   Vitals:   11/05/18 1430  BP: 124/78  Pulse: (!) 101  Temp: 98.4 F (36.9 C)    Not recorded      There is no height or weight on file to calculate BMI.   Fairly normal right arm and right lower extremity movement, mild left arm weakness, fixation with rapid rotating movement, proximal and distal strength 4+, difficulty extent left fingers after grip,   moderate spasticity of left lower extremity, left hip flexion 3, left knee flexion 4, left knee extension 4+, left ankle dorsiflexion 4+, plantarflexion 5,  She needs assistance to get up from seated position, rely on her cane, hyperextended her left knee,  dragging her left leg across the floor, tends to hold left arm elbow flexion.  DIAGNOSTIC DATA (LABS, IMAGING, TESTING) - I reviewed patient records, labs, notes, testing and imaging myself where available.   ASSESSMENT AND PLAN  Reylene Stauder is a 48 y.o. female    Relapsing Remitting Multiple Sclerosis, left spasticity, lower extremity more than left upper extremity.  We used BOTOX A 600 units + 200 units sample today  Under EMG 300 units to left upper extremity, 500 units were injected into left lower extremity.  Left pectoralis major 100 units Left latissimus dorsi 50 units Left biceps 50 units Left brachialis 100 units   Left tibialis posterior 100 units Left flexor digitorum longus 100 units  Left vastus lateralis 25 unitsx2=50 units Left vastus medialis 25 units Left rectus femoris 25 unitsx2=50 units Left vastus intermedius  25 units Left adductor longus 50 units Left adductor magnus 50 units  Left flexor  digitorum brevis  25 units Right flexor digitorum brevis 25 units    Patient tolerated injection well will return to clinic in 3 months for repeat injection  Marcial Pacas,  M.D. Ph.D.  [facility] [address], [place], [place] [address] Ph: [phone] Fax: [phone]  CC: Referring Provider

## 2018-11-05 NOTE — Progress Notes (Signed)
**  Botox 100 units x 6 units, NDC 9574-7340-37, Lot [id number], Exp 06/2021, specialty pharmacy. Botox 200 units x 1 vial, Lot [id number], Exp 03/2020, sample medication.//mck,rn**

## 2018-11-10 ENCOUNTER — Encounter: Payer: Self-pay | Admitting: Neurology

## 2018-11-13 ENCOUNTER — Other Ambulatory Visit: Payer: Self-pay | Admitting: *Deleted

## 2018-11-13 MED ORDER — SULFAMETHOXAZOLE-TRIMETHOPRIM 800-160 MG PO TABS: ORAL_TABLET | ORAL | 0 refills | Status: DC

## 2018-11-18 ENCOUNTER — Other Ambulatory Visit: Payer: Self-pay | Admitting: Neurology

## 2018-11-18 MED ORDER — DULOXETINE HCL 30 MG PO CPEP: ORAL_CAPSULE | ORAL | 3 refills | Status: DC

## 2018-11-18 NOTE — Progress Notes (Signed)
Cymbalta will be called into the pharmacy for management of neuropathic pain.  The patient could not fully tolerate the Lyrica secondary to weight gain.

## 2018-11-21 DIAGNOSIS — G894 Chronic pain syndrome: Secondary | ICD-10-CM | POA: Diagnosis not present

## 2018-11-21 DIAGNOSIS — M6281 Muscle weakness (generalized): Secondary | ICD-10-CM | POA: Diagnosis not present

## 2018-11-21 DIAGNOSIS — N390 Urinary tract infection, site not specified: Secondary | ICD-10-CM | POA: Diagnosis not present

## 2018-11-21 DIAGNOSIS — Z9189 Other specified personal risk factors, not elsewhere classified: Secondary | ICD-10-CM | POA: Diagnosis not present

## 2018-12-11 ENCOUNTER — Encounter: Payer: Self-pay | Admitting: Neurology

## 2018-12-15 ENCOUNTER — Encounter: Payer: Self-pay | Admitting: Neurology

## 2018-12-15 ENCOUNTER — Ambulatory Visit (INDEPENDENT_AMBULATORY_CARE_PROVIDER_SITE_OTHER): Payer: BC Managed Care – PPO | Admitting: Neurology

## 2018-12-15 ENCOUNTER — Other Ambulatory Visit: Payer: Self-pay

## 2018-12-15 VITALS — BP 120/74 | HR 85 | Temp 99.3°F | Ht 69.0 in

## 2018-12-15 DIAGNOSIS — H532 Diplopia: Secondary | ICD-10-CM

## 2018-12-15 DIAGNOSIS — G35 Multiple sclerosis: Secondary | ICD-10-CM | POA: Diagnosis not present

## 2018-12-15 DIAGNOSIS — R269 Unspecified abnormalities of gait and mobility: Secondary | ICD-10-CM

## 2018-12-15 DIAGNOSIS — G8220 Paraplegia, unspecified: Secondary | ICD-10-CM | POA: Diagnosis not present

## 2018-12-15 DIAGNOSIS — Z79899 Other long term (current) drug therapy: Secondary | ICD-10-CM

## 2018-12-15 DIAGNOSIS — F418 Other specified anxiety disorders: Secondary | ICD-10-CM

## 2018-12-15 MED ORDER — DIAZEPAM 5 MG PO TABS: ORAL_TABLET | ORAL | 3 refills | Status: DC

## 2018-12-15 MED ORDER — BACLOFEN 20 MG PO TABS: ORAL_TABLET | ORAL | 3 refills | Status: DC

## 2018-12-15 NOTE — Progress Notes (Signed)
[facility]  PATIENT: Sharon Odom DOB: June 10, 1970  REFERRING DOCTOR OR PCP:  Claretta Fraise SOURCE: Patient, notes imaging and lab reports, MRI images on CD from neurology,  _________________________________   HISTORICAL   CHIEF COMPLAINT:  Chief Complaint  Patient presents with   Follow-up    RM 13 with husband. Husband temp: 98.0.  Last seen 09/18/18 but here to discuss different treatment options.   Gait Problem    In WC in office today.    HISTORY OF PRESENT ILLNESS: Sharon Odom is a 48 y.o. woman with multiple sclerosis, left greater than right spasticity and poor gait.  Update 12/15/2018: She has had Lemtrade x 3 courses for her MS.  Last dose was October 2020.   Lymphocyte count 11/2018 was 0.9.    She has no exacerbations.   She is noting more spasticity in her right leg than before and spasticity on the left is about the same.      She has severe spasticity and has tried Botox several courses without much benefit for the legs but she had some benefit in the left arm.   We discussed the baclofen pump.    Currently she is on baclofen 120 mg and valium  a day.   Tizanidine had not helped her in the past.    She is noting more fatigue.    She has some anxiety, helped by valium.     She also has dysesthesias that were better with Lyrica but she had weight gain and constipation and stopped.     She started Cymbalta.    Lamotrigine was not well tolerated.      Update 09/18/2018: She had her third year of Lemtrada in October 2019.  She tolerated it well.  CD4 count was 66 December 2019.  Most recent lymphocyte count is 0.6.  We had a conversation about COVID and [place].  As her lymphocytes are recovering, her risk is probably just slightly worse than it would be otherwise.  However, if the pandemic is still ongoing in a significant way in the late fall, we may want to push back any additional dosing.  She feels her MS is doing about the same with no new  exacerbations though she has continued impairments, left > right.    She has severe spasticity and poor gait.     She had Botox 08/06/2018 (Dr. Terrace Arabia) and she feel she got a good benefit at first with almost no spasms for a few days.   She is noting less stiffness in her thigh.  However, the spasticity in her lower leg and foot is not better. Calf still has severe spasticity.  She had 400 Units in the leg and 200 units in the arm.   She did not get a benefit in the arm.    She is having more pain in the left arm and leg, worse while sitting than standing.    Baclofen 120 mg daily is not helping.   She is also taking valium 10 mg -- this has helped anxiety more than spasticity. Her spasticity seemed to worsen a couple years ago.    She has dysesthesias, left > right.  Oxcarbaxepine and lamotrigine were poorly tolerated.  She has more fatigue and sleepiness.     She went to the ED 08/21/2018 due to shortness of breath and she was felt to be having severe seasonal allergies.   Update 04/14/2018: During REMS testing her creatinine level was elevated at 1.41 (it  was 0.91 1 month earlier).   Of note, she reports that her creatinine has fluctuated over the years and that there was one other time when she was felt to have a high reading that was felt to be due to dehydration.  She has noted decreased oral liquid intake at times due to dysphasia.   We discussed possibility of an autoimmune kidney disease as that can be seen at about 1: 400 Lemtrada patients  Spasticity is her main issue.    The left leg, especially, spasms up with movements.   Botox had not had much of a benefit on the left and maybe a 25% benefit in the milder right.    Spasms are very painful.    She also has spasticity in the left arm.    Pain feels like the arm is being held tightly by someone.  The left leg does worse with activity so she can't exercise with it.     She has done PT in the past but it has not helped much.    She is having more  trouble with basic ADL.   She got the [facility] but it did not help that much.      She tires out easily with any activity.      She notes more trouble with swallowing liquids and is drinking less.     The problem is initiating the swallowing.     Hr sister notes that she seems to be having some hearing loss as she turns up TV louder and misses what people say at times.    Her anxiety is worse and she is back on Xanax.     More spasticity in the left arm, especially after squeezing, she can't easily straighten the fingers back up  Update 01/23/2018: Her main problem continues to be spasticity in the legs, left greater than right.  Associated with the spasticity she has quite a bit of pain.  She felt she received benefit from the Botox injections into the legs for a few weeks but then the spasticity returned to the same level.  We discussed using a larger dose today and will consider increasing the dose further based on her response.  She is able to tolerate the baclofen better and has been able to increase the dose.  Gait is poor.  Ampyra seems to be helping a little bit.  She had 2 years of Lemtrada therapy, with the last dose about 15 months ago.  We are in the process of trying to get an additional year of [place] approved.    Update 10/16/2017: After the last visit, the baclofen dose was increased but she had trouble tolerating higher doses and did not think that her spasticity improved any.  She continues to report a lot of spasticity in both legs, left greater than right and in the left arm.  She denies spasticity in the right arm.  There continues to phasic spasms superimposed on the tonic spasms predominantly involving both feet and the left leg.  Gait is poor.  Around the home she will use a walker and can get from one room to another.   At the last visit I also started lamotrigine for her dysesthesias but it was poorly tolerated and she stopped.  Previously she had been unable to tolerate  oxcarbazepine.  She has not yet restarted the Ampyra.  In the past, she had received Botox injections into muscles of the left lower leg and both feet.  This  helped quite a bit with the first series but less so with the second series.  Since she has moved up to this area, she has not had any more Botox injections.  She had her first course of Lemtrada April 2017 and the second course of April 2018.  She has noted some further progression over the last year and we have discussed an additional third course.  She had shingles earlier this year.  Pain has resolved.  From 08/15/2017:  She was diagnosed in October 2013 due to difficulty with her handwriting and right hand coordination.   A few weeks later she had gait ataxia and numbness and was referred to MS.   She had an [place] of the brain [place] in [place] the time[place] and then had a LP confirming the diagnosis.  Initially, she was placed on Copaxone but had severe frequent exacerbations and then went to Tecfidera due to breakthrough exacerbations.  She was [facility] to [facility] (Feb 2014 to November 2016).  [place] Dr. Ilene QuaNegroski [facility]([place] [phone]).   She was always JCV Ab titer positive.    She did well on it but stopped 03/2015 due to safety risk.  She had more spasticity early 2017 that improved with 5 days IV Solumedrol and then started [facility] April 2017.   The infusion went well.    She started having more spasms in her legs later in 2017 and had Botox injections into her feet (some benefit but only for 3-4 weeks).   She had a second course of Botox November 2017 without much benefit.     She had numbness and tingling in her left arm November 2017 and was given an ESI.   Medical MJ did not help.   She had her second course of Lemtrada in April 2018 but the steroid did not help her spasticity as it did the first year.    [facility] in 2018 and also saw Dr. Aquilla HackerGhobrial at  [facility].  Currently,   She uses a walker (rarely cane) around her home and is wheeled outside the house.   Earlier this year, she had worsening spasticity with toes very spastic and legs locking up.    The severe leg spasms have improved but she still has spasms in her feet that are severe and painful.   Spasticity is worse on the left (foot, leg and arm) and only in her foot on the right.    She takes 20 mg po qid.   Tizanidine did not help much.   She has some painful dysesthesia but gabapentin had not helped.   She continues to have a lot of pain, worse on her left.     Her left arm is also weak and she can't do many self care tasks (dressing, etc).    Due to the pain, she was drinking alcohol (2 - 3 drinks).   Clonazepam was tried but doses were low.   She was on Ampyra in the past 2014-2017 but felt it wasn't working and stopped.   She was on both Ampyra and Wellbutrin for at least one year and there were no seizures.     She has urinary frequency and urgency, helped by Myrbetriq.   Sometimes due to her gait, she cannot get to the bathroom in time.    She had diplopia during 1 of her exacerbations a few years ago.  She continues  to have some double vision though she is doing better now.    She never had optic neuritis.    Wellbutrin helped her mood better initially and she is very frustrated and irritable.   She was diagnosed with PBA in May 2018 and started on Nuedexta.   She did better mood-wise but spasticity was worse and she stopped.       She feels cognitive skills are mildly worse with mild short term memory issues and decreased focus and attention.     I have reviewed the MRI of the brain and cervical spine dated 03/19/2017.  The MRI of the brain shows multiple T2/FLAIR hyperintense foci in the cerebellum, pons, left greater than right thalamus and in the periventricular, juxtacortical and deep white matter of both hemispheres.    The MRI of the cervical spine showed a subtle focus  Adjacent to C4-C5.  She has a disc protrusion at [id number].  REVIEW OF SYSTEMS: Constitutional: No fevers, chills, sweats, or change in appetite.  She reports fatigue. Eyes: As above Ear, nose and throat: No hearing loss, ear pain, nasal congestion, sore throat Cardiovascular: No chest pain, palpitations Respiratory: No shortness of breath at rest or with exertion.   No wheezes GastrointestinaI: No nausea, vomiting, diarrhea, abdominal pain, fecal incontinence Genitourinary:As above  musculoskeletal:She has pain in her legs, left greater than right due to spasticity.  She notes mild back pain. Integumentary: No rash, pruritus, skin lesions Neurological: as above Psychiatric: As above Endocrine: No palpitations, diaphoresis, change in appetite, change in weigh or increased thirst Hematologic/Lymphatic: No anemia, purpura, petechiae. Allergic/Immunologic: No itchy/runny eyes, nasal congestion, recent allergic reactions, rashes  ALLERGIES: Allergies  Allergen Reactions   Morphine Other (See Comments)    HOME MEDICATIONS:  Current Outpatient Medications:    acetaminophen (TYLENOL) 650 MG CR tablet, Take 1,300 mg by mouth. , Disp: , Rfl:    acetaminophen-codeine (TYLENOL #3) 300-30 MG tablet, TAKE 1 TABLET BY MOUTH EVERY 8 HOURS AS NEEDED FOR MODERATE PAIN (Patient taking differently: Take 2 tablets by mouth every 8 (eight) hours as needed. ), Disp: 90 tablet, Rfl: 5   albuterol (PROVENTIL) (2.5 MG/3ML) 0.083% nebulizer solution, Take 2.5 mg by nebulization every 6 (six) hours as needed for wheezing or shortness of breath., Disp: , Rfl:    albuterol (VENTOLIN HFA) 108 (90 Base) MCG/ACT inhaler, Inhale into the lungs every 6 (six) hours as needed for wheezing or shortness of breath., Disp: , Rfl:    Alemtuzumab (LEMTRADA) 12 MG/1.2ML SOLN, Inject 12 mg into the vein., Disp: , Rfl:    baclofen (LIORESAL) 20 MG tablet, Take up to 6 pills daily for MS spasticity, Disp: 540 each, Rfl:  3   botulinum toxin Type A (BOTOX) 100 units SOLR injection, Inject 100 Units into the muscle every 3 (three) months. (Patient taking differently: Inject 200 Units into the muscle every 3 (three) months. ), Disp: 6 vial, Rfl: 3   buPROPion (WELLBUTRIN XL) 300 MG 24 hr tablet, Take by mouth., Disp: , Rfl:    Cholecalciferol (VITAMIN D-3) 5000 units TABS, Take by mouth., Disp: , Rfl:    Cyanocobalamin (VITAMIN B-12) 2500 MCG SUBL, Place under the tongue., Disp: , Rfl:    cyclobenzaprine (FLEXERIL) 10 MG tablet, Take 1 tablet (10 mg total) by mouth 3 (three) times daily., Disp: 90 tablet, Rfl: 5   dalfampridine 10 MG TB12, One po bid, Disp: 60 tablet, Rfl: 11   diazepam (VALIUM) 5 MG tablet, Take up to 3  pills a day for spasticity, Disp: 90 tablet, Rfl: 3   DULoxetine (CYMBALTA) 30 MG capsule, 1 tablet daily for 2 weeks and then take 1 twice daily, Disp: 60 capsule, Rfl: 3   furosemide (LASIX) 20 MG tablet, Take 20 mg by mouth., Disp: , Rfl:    ibuprofen (ADVIL,MOTRIN) 200 MG tablet, Take 400 mg by mouth as needed. , Disp: , Rfl:    incobotulinumtoxinA (XEOMIN) 100 units SOLR injection, Inject 600 Units into the muscle every 3 (three) months., Disp: , Rfl:    Mag Aspart-Potassium Aspart (RA POTASSIUM/MAGNESIUM) 250-250 MG CAPS, Take 500 mg by mouth. , Disp: , Rfl:    montelukast (SINGULAIR) 10 MG tablet, Take 10 mg by mouth at bedtime., Disp: , Rfl:    nitrofurantoin (MACRODANTIN) 100 MG capsule, Take 100 mg by mouth 2 (two) times daily., Disp: , Rfl:    ondansetron (ZOFRAN) 8 MG tablet, as needed With infusions, Disp: 20 tablet, Rfl: 1   ranitidine (ZANTAC) 300 MG capsule, Take 300 mg by mouth as needed. , Disp: , Rfl:    sulfamethoxazole-trimethoprim (BACTRIM DS) 800-160 MG tablet, Take 1 tablet twice daily by mouth for 1 week, Disp: 14 tablet, Rfl: 0   mirabegron ER (MYRBETRIQ) 50 MG TB24 tablet, Take by mouth., Disp: , Rfl:   PAST MEDICAL HISTORY: Past Medical History:   Diagnosis Date   Multiple sclerosis (HCC)    Vision abnormalities     PAST SURGICAL HISTORY:   FAMILY HISTORY: Family History  Problem Relation Age of Onset   Diabetes Mother    Healthy Brother     SOCIAL HISTORY:  Social History   Socioeconomic History   Marital status: Married    Spouse name: Not on file   Number of children: Not on file   Years of education: Not on file   Highest education level: Not on file  Occupational History   Not on file  Social Needs   Financial resource strain: Not on file   Food insecurity    Worry: Not on file    Inability: Not on file   Transportation needs    Medical: Not on file    Non-medical: Not on file  Tobacco Use   Smoking status: Current Every Day Smoker   Smokeless tobacco: Current User  Substance and Sexual Activity   Alcohol use: Yes    Comment: 2-3 times per wk   Drug use: Never   Sexual activity: Not on file  Lifestyle   Physical activity    Days per week: Not on file    Minutes per session: Not on file   Stress: Not on file  Relationships   Social connections    Talks on phone: Not on file    Gets together: Not on file    Attends religious service: Not on file    Active member of club or organization: Not on file    Attends meetings of clubs or organizations: Not on file    Relationship status: Not on file   Intimate partner violence    Fear of current or ex partner: Not on file    Emotionally abused: Not on file    Physically abused: Not on file    Forced sexual activity: Not on file  Other Topics Concern   Not on file  Social History Narrative   Right handed    Caffeine: 1 cup or less per day- coffee   [employer]     PHYSICAL EXAM  Vitals:  12/15/18 1331  BP: 120/74  Pulse: 85  Temp: 99.3 F (37.4 C)  SpO2: 96%  Height: 5\' 9"  (1.753 m)    Body mass index is 30.2 kg/m.   General: The patient is well-developed and well-nourished and in no acute  distress  Neurologic Exam  Mental status: The patient is alert and oriented x 3 at the time of the examination. The patient has apparent normal recent and remote memory, with an apparently normal attention span and concentration ability.   Speech is normal.  Cranial nerves: Bilateral INO's, right greater than left.  Facial strength and sensation is normal.  The tongue is midline, and the patient has symmetric elevation of the soft palate. No obvious hearing deficits are noted.  Motor:   Making a fist triggered flexion spasms in the left hand/fingers.  After standing for about 20 to 30 seconds, she had flexion spasms of the left foot.  Muscle bulk is normal.  There is increased muscle tone in the legs, left greater than right.  She has moderate increased muscle tone in the left arm.  The right arm has normal muscle tone.  Strength is 4to 4+/5 in the left arm, 3-4-/5 in the left leg and 4- to 4/5 in the right leg .     Sensory: Normal sensation to touch and vibration in the arms.  She has reduced sensation to vibration in the left leg.  Coordination: Cerebellar testing reveals good right finger-nose-finger and reduced left finger-nose-finger.  Right heel-to-shin is poor.  She cannot do heel-to-shin on the left.  Gait and station: She needs support to stand.  Her gait is spastic and needs bilateral support.  She cannot do a tandem walk.  She is walking faster since starting the Ampyra Romberg is positive.  Reflexes: Deep tendon reflexes are increased in the legs with spread at the knees and clonus at the ankles, nonsustained bilaterally but more on the left.   plantar responses are flexor.    DIAGNOSTIC DATA (LABS, IMAGING, TESTING) - I reviewed patient records, labs, notes, testing and imaging myself where available.     ASSESSMENT AND PLAN     1. Multiple sclerosis (HCC)   2. Spastic diplegia, acquired, lower extremity (HCC)   3. Gait disturbance   4. Diplopia   5. High risk  medication use   6. Depression with anxiety    1.  She has had 3 courses of Lemtrada for her MS.  No definite recent exacerbation.  She needs to continue the REMS monitoring until late 2023. 2.  Very long discussion about her spasticity.  Unfortunately, the benefit from Botox was only mild.  We discussed a baclofen pump as another option.  We will see if we can get a lumbar puncture and baclofen trial set up with neurosurgery. 3.   She is only been on Cymbalta a few weeks.  If dysesthesias do not improve consider the addition of oxcarbazepine. 4.   Continue Ampyra for gait.  Follow-up in 4 months or sooner if there are new or worsening neurologic symptoms.  40-minute face-to-face evaluation with greater than one half of the time counseling and coordinating care related to her spasticity from the MS.  Aishi Courts A. Felecia Shelling, MD, [facility] Shave 3/87/5643, 3:29 PM Certified in Neurology, Clinical Neurophysiology, Sleep Medicine, Pain Medicine and Neuroimaging  [facility] [address] [place], [place] [address] [phone]

## 2018-12-16 ENCOUNTER — Other Ambulatory Visit: Payer: Self-pay | Admitting: *Deleted

## 2018-12-16 DIAGNOSIS — G35 Multiple sclerosis: Secondary | ICD-10-CM

## 2018-12-16 DIAGNOSIS — G8220 Paraplegia, unspecified: Secondary | ICD-10-CM

## 2018-12-17 DIAGNOSIS — G35 Multiple sclerosis: Secondary | ICD-10-CM | POA: Diagnosis not present

## 2018-12-20 DIAGNOSIS — G35 Multiple sclerosis: Secondary | ICD-10-CM | POA: Diagnosis not present

## 2018-12-22 DIAGNOSIS — N390 Urinary tract infection, site not specified: Secondary | ICD-10-CM | POA: Diagnosis not present

## 2018-12-22 DIAGNOSIS — G894 Chronic pain syndrome: Secondary | ICD-10-CM | POA: Diagnosis not present

## 2018-12-22 DIAGNOSIS — M6281 Muscle weakness (generalized): Secondary | ICD-10-CM | POA: Diagnosis not present

## 2018-12-22 DIAGNOSIS — G35 Multiple sclerosis: Secondary | ICD-10-CM | POA: Diagnosis not present

## 2018-12-23 ENCOUNTER — Telehealth: Payer: Self-pay | Admitting: Neurology

## 2018-12-23 DIAGNOSIS — G35 Multiple sclerosis: Secondary | ICD-10-CM | POA: Diagnosis not present

## 2018-12-23 NOTE — Telephone Encounter (Signed)
Company: [employer]  Frequency: 2 week 6 , 1 week [place]  CB# [phone] - Lenell Antu

## 2018-12-23 NOTE — Telephone Encounter (Signed)
Dr. Felecia Shelling- FYI I called Lenell Antu back. Pt refusing OT. Provided VO for PT 2 times per week for 6 weeks and then 1 time per week for 2 weeks. She verbalized understanding and will call back if anything further needed.

## 2018-12-24 DIAGNOSIS — G35 Multiple sclerosis: Secondary | ICD-10-CM | POA: Diagnosis not present

## 2018-12-26 DIAGNOSIS — G35 Multiple sclerosis: Secondary | ICD-10-CM | POA: Diagnosis not present

## 2018-12-30 ENCOUNTER — Telehealth: Payer: Self-pay | Admitting: Neurology

## 2018-12-30 NOTE — Telephone Encounter (Signed)
Dr. Sater- FYI 

## 2018-12-30 NOTE — Telephone Encounter (Signed)
FYI-Gary from [facility] called wanting to inform us that the pt missed todays therapy session due to her [facility] going out.

## 2019-01-02 DIAGNOSIS — F17210 Nicotine dependence, cigarettes, uncomplicated: Secondary | ICD-10-CM | POA: Diagnosis not present

## 2019-01-02 DIAGNOSIS — G35 Multiple sclerosis: Secondary | ICD-10-CM | POA: Diagnosis not present

## 2019-01-02 DIAGNOSIS — G8220 Paraplegia, unspecified: Secondary | ICD-10-CM | POA: Diagnosis not present

## 2019-01-02 DIAGNOSIS — H532 Diplopia: Secondary | ICD-10-CM | POA: Diagnosis not present

## 2019-01-02 DIAGNOSIS — F418 Other specified anxiety disorders: Secondary | ICD-10-CM | POA: Diagnosis not present

## 2019-01-07 DIAGNOSIS — G8220 Paraplegia, unspecified: Secondary | ICD-10-CM | POA: Diagnosis not present

## 2019-01-07 DIAGNOSIS — F17210 Nicotine dependence, cigarettes, uncomplicated: Secondary | ICD-10-CM | POA: Diagnosis not present

## 2019-01-07 DIAGNOSIS — H532 Diplopia: Secondary | ICD-10-CM | POA: Diagnosis not present

## 2019-01-07 DIAGNOSIS — G35 Multiple sclerosis: Secondary | ICD-10-CM | POA: Diagnosis not present

## 2019-01-07 DIAGNOSIS — F418 Other specified anxiety disorders: Secondary | ICD-10-CM | POA: Diagnosis not present

## 2019-01-08 DIAGNOSIS — F17210 Nicotine dependence, cigarettes, uncomplicated: Secondary | ICD-10-CM | POA: Diagnosis not present

## 2019-01-08 DIAGNOSIS — H532 Diplopia: Secondary | ICD-10-CM | POA: Diagnosis not present

## 2019-01-08 DIAGNOSIS — G35 Multiple sclerosis: Secondary | ICD-10-CM | POA: Diagnosis not present

## 2019-01-08 DIAGNOSIS — F418 Other specified anxiety disorders: Secondary | ICD-10-CM | POA: Diagnosis not present

## 2019-01-08 DIAGNOSIS — G8220 Paraplegia, unspecified: Secondary | ICD-10-CM | POA: Diagnosis not present

## 2019-01-09 DIAGNOSIS — H532 Diplopia: Secondary | ICD-10-CM | POA: Diagnosis not present

## 2019-01-09 DIAGNOSIS — G35 Multiple sclerosis: Secondary | ICD-10-CM | POA: Diagnosis not present

## 2019-01-09 DIAGNOSIS — F418 Other specified anxiety disorders: Secondary | ICD-10-CM | POA: Diagnosis not present

## 2019-01-09 DIAGNOSIS — G8220 Paraplegia, unspecified: Secondary | ICD-10-CM | POA: Diagnosis not present

## 2019-01-09 DIAGNOSIS — F17210 Nicotine dependence, cigarettes, uncomplicated: Secondary | ICD-10-CM | POA: Diagnosis not present

## 2019-01-13 DIAGNOSIS — G35 Multiple sclerosis: Secondary | ICD-10-CM | POA: Diagnosis not present

## 2019-01-13 DIAGNOSIS — H532 Diplopia: Secondary | ICD-10-CM | POA: Diagnosis not present

## 2019-01-13 DIAGNOSIS — F17210 Nicotine dependence, cigarettes, uncomplicated: Secondary | ICD-10-CM | POA: Diagnosis not present

## 2019-01-13 DIAGNOSIS — G8220 Paraplegia, unspecified: Secondary | ICD-10-CM | POA: Diagnosis not present

## 2019-01-13 DIAGNOSIS — F418 Other specified anxiety disorders: Secondary | ICD-10-CM | POA: Diagnosis not present

## 2019-01-14 DIAGNOSIS — F17210 Nicotine dependence, cigarettes, uncomplicated: Secondary | ICD-10-CM | POA: Diagnosis not present

## 2019-01-14 DIAGNOSIS — F418 Other specified anxiety disorders: Secondary | ICD-10-CM | POA: Diagnosis not present

## 2019-01-14 DIAGNOSIS — G35 Multiple sclerosis: Secondary | ICD-10-CM | POA: Diagnosis not present

## 2019-01-14 DIAGNOSIS — H532 Diplopia: Secondary | ICD-10-CM | POA: Diagnosis not present

## 2019-01-14 DIAGNOSIS — G8220 Paraplegia, unspecified: Secondary | ICD-10-CM | POA: Diagnosis not present

## 2019-01-15 DIAGNOSIS — G8220 Paraplegia, unspecified: Secondary | ICD-10-CM | POA: Diagnosis not present

## 2019-01-15 DIAGNOSIS — H532 Diplopia: Secondary | ICD-10-CM | POA: Diagnosis not present

## 2019-01-15 DIAGNOSIS — F17210 Nicotine dependence, cigarettes, uncomplicated: Secondary | ICD-10-CM | POA: Diagnosis not present

## 2019-01-15 DIAGNOSIS — F418 Other specified anxiety disorders: Secondary | ICD-10-CM | POA: Diagnosis not present

## 2019-01-15 DIAGNOSIS — G35 Multiple sclerosis: Secondary | ICD-10-CM | POA: Diagnosis not present

## 2019-01-19 DIAGNOSIS — F17210 Nicotine dependence, cigarettes, uncomplicated: Secondary | ICD-10-CM | POA: Diagnosis not present

## 2019-01-19 DIAGNOSIS — G8220 Paraplegia, unspecified: Secondary | ICD-10-CM | POA: Diagnosis not present

## 2019-01-19 DIAGNOSIS — F418 Other specified anxiety disorders: Secondary | ICD-10-CM | POA: Diagnosis not present

## 2019-01-19 DIAGNOSIS — H532 Diplopia: Secondary | ICD-10-CM | POA: Diagnosis not present

## 2019-01-19 DIAGNOSIS — G35 Multiple sclerosis: Secondary | ICD-10-CM | POA: Diagnosis not present

## 2019-01-20 DIAGNOSIS — G8220 Paraplegia, unspecified: Secondary | ICD-10-CM | POA: Diagnosis not present

## 2019-01-20 DIAGNOSIS — G35 Multiple sclerosis: Secondary | ICD-10-CM | POA: Diagnosis not present

## 2019-01-20 DIAGNOSIS — F17210 Nicotine dependence, cigarettes, uncomplicated: Secondary | ICD-10-CM | POA: Diagnosis not present

## 2019-01-20 DIAGNOSIS — F418 Other specified anxiety disorders: Secondary | ICD-10-CM | POA: Diagnosis not present

## 2019-01-20 DIAGNOSIS — H532 Diplopia: Secondary | ICD-10-CM | POA: Diagnosis not present

## 2019-01-22 DIAGNOSIS — G35 Multiple sclerosis: Secondary | ICD-10-CM | POA: Diagnosis not present

## 2019-01-22 DIAGNOSIS — F418 Other specified anxiety disorders: Secondary | ICD-10-CM | POA: Diagnosis not present

## 2019-01-22 DIAGNOSIS — H532 Diplopia: Secondary | ICD-10-CM | POA: Diagnosis not present

## 2019-01-22 DIAGNOSIS — F17210 Nicotine dependence, cigarettes, uncomplicated: Secondary | ICD-10-CM | POA: Diagnosis not present

## 2019-01-22 DIAGNOSIS — G8220 Paraplegia, unspecified: Secondary | ICD-10-CM | POA: Diagnosis not present

## 2019-01-23 DIAGNOSIS — D849 Immunodeficiency, unspecified: Secondary | ICD-10-CM | POA: Diagnosis not present

## 2019-01-23 DIAGNOSIS — M25512 Pain in left shoulder: Secondary | ICD-10-CM | POA: Diagnosis not present

## 2019-01-23 DIAGNOSIS — G35 Multiple sclerosis: Secondary | ICD-10-CM | POA: Diagnosis not present

## 2019-01-23 DIAGNOSIS — N390 Urinary tract infection, site not specified: Secondary | ICD-10-CM | POA: Diagnosis not present

## 2019-01-26 DIAGNOSIS — F17210 Nicotine dependence, cigarettes, uncomplicated: Secondary | ICD-10-CM | POA: Diagnosis not present

## 2019-01-26 DIAGNOSIS — G35 Multiple sclerosis: Secondary | ICD-10-CM | POA: Diagnosis not present

## 2019-01-26 DIAGNOSIS — F418 Other specified anxiety disorders: Secondary | ICD-10-CM | POA: Diagnosis not present

## 2019-01-26 DIAGNOSIS — G8220 Paraplegia, unspecified: Secondary | ICD-10-CM | POA: Diagnosis not present

## 2019-01-26 DIAGNOSIS — H532 Diplopia: Secondary | ICD-10-CM | POA: Diagnosis not present

## 2019-01-28 DIAGNOSIS — F418 Other specified anxiety disorders: Secondary | ICD-10-CM | POA: Diagnosis not present

## 2019-01-28 DIAGNOSIS — G35 Multiple sclerosis: Secondary | ICD-10-CM | POA: Diagnosis not present

## 2019-01-28 DIAGNOSIS — H532 Diplopia: Secondary | ICD-10-CM | POA: Diagnosis not present

## 2019-01-28 DIAGNOSIS — G8220 Paraplegia, unspecified: Secondary | ICD-10-CM | POA: Diagnosis not present

## 2019-01-28 DIAGNOSIS — F17210 Nicotine dependence, cigarettes, uncomplicated: Secondary | ICD-10-CM | POA: Diagnosis not present

## 2019-01-29 ENCOUNTER — Other Ambulatory Visit: Payer: Self-pay | Admitting: Anesthesiology

## 2019-01-29 DIAGNOSIS — M62838 Other muscle spasm: Secondary | ICD-10-CM | POA: Diagnosis not present

## 2019-01-29 DIAGNOSIS — G35 Multiple sclerosis: Secondary | ICD-10-CM | POA: Diagnosis not present

## 2019-02-04 DIAGNOSIS — G35 Multiple sclerosis: Secondary | ICD-10-CM | POA: Diagnosis not present

## 2019-02-04 DIAGNOSIS — M62838 Other muscle spasm: Secondary | ICD-10-CM | POA: Diagnosis not present

## 2019-02-05 DIAGNOSIS — G35 Multiple sclerosis: Secondary | ICD-10-CM | POA: Diagnosis not present

## 2019-02-05 DIAGNOSIS — F17210 Nicotine dependence, cigarettes, uncomplicated: Secondary | ICD-10-CM | POA: Diagnosis not present

## 2019-02-05 DIAGNOSIS — F418 Other specified anxiety disorders: Secondary | ICD-10-CM | POA: Diagnosis not present

## 2019-02-05 DIAGNOSIS — H532 Diplopia: Secondary | ICD-10-CM | POA: Diagnosis not present

## 2019-02-05 DIAGNOSIS — G8220 Paraplegia, unspecified: Secondary | ICD-10-CM | POA: Diagnosis not present

## 2019-02-09 ENCOUNTER — Encounter: Payer: Self-pay | Admitting: Neurology

## 2019-02-09 ENCOUNTER — Other Ambulatory Visit: Payer: Self-pay

## 2019-02-09 ENCOUNTER — Ambulatory Visit (INDEPENDENT_AMBULATORY_CARE_PROVIDER_SITE_OTHER): Payer: BC Managed Care – PPO | Admitting: Neurology

## 2019-02-09 VITALS — BP 135/88 | HR 90 | Temp 98.6°F

## 2019-02-09 DIAGNOSIS — G35 Multiple sclerosis: Secondary | ICD-10-CM | POA: Diagnosis not present

## 2019-02-09 DIAGNOSIS — R269 Unspecified abnormalities of gait and mobility: Secondary | ICD-10-CM

## 2019-02-09 DIAGNOSIS — M62838 Other muscle spasm: Secondary | ICD-10-CM

## 2019-02-09 NOTE — Progress Notes (Signed)
PATIENT: Sharon Odom DOB: June 24, 1970  Chief Complaint  Patient presents with   MS, Spastic Diplegia    Botox 100 units x 6 vials - specialty pharmacy     HISTORICAL   Sharon Odom is a 48 years old female, accompanied by her husband, referred by Dr. Felecia Shelling for evaluation of botulism toxin injection for spasticity due to multiple sclerosis, this is the first time I evaluated her, also performed injection today on April 18, 2018.  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  She received Lemtrada infusion in April 2017, second dose April 2018, due to continued mild progression of her symptoms, she complete her third Lemtrada infusion in October 2019.  She has been complaining of bilateral lower extremity, left upper extremity spasticity since late 2017, gradually getting worse, has tried different medications, including baclofen up to 120 mg daily, with limited benefit, she is not taking baclofen 80 mg, previously also tried and failed tizanidine, Trileptal, lower dose of botulism toxin a injection in the past only mild temporarily improvement.  She did not ambulate with walker at home, wheelchair for longer distance, complains painful frequent left lower extremity spasm, forceful left lower extremity extension, when she bear weight, she has left toes forceful painful flexion grabbing on the floor, also left shoulder, arm spasm, difficulty release left hand grip.  She was recently started on Valium 5 mg every 6 hours,  which has been helpful.  I personally reviewed the MRI of the brain and cervical spine in September 2019, the MRI of the brain shows multiple T2/FLAIR hyperintense foci in brainstem, cerebellum, left thalamus, hemisphere, consistent with typical chronic demyelinating plaque, no enhancement. MRI of cervical spine showed small T2/flair hyperintensity foci posterior to the left C3-4, several 45 also noted within the brainstem, multilevel mild degenerative changes.  No significant canal stenosis.   She also complains of urinary urgency, frequency, is taking Myrbetriq 50 mg, Wellbutrin 300 mg for depression, mild swallowing difficulty, swallowing evaluation is pending  UPDATE August 06 2018: She had first injection on April 18, 2018, we used Botox a 600 units, 400 units into spastic left lower extremity, 200 into left upper extremity,  Within 3 days, she notice weakness of left 1, 2 fingers, lasted for few weeks, she has difficulty using her left hand, but her left bicep pains has much improved,it also helped her left calf muscle spams.  She noticed increased left leg and shoulder muscle spasm since Jan 10.  UPDATE November 05 2018: She responded well to previous injection, reported 80% improvement of her left thigh muscle spasm, 50% improvement of her left shoulder, proximal arm muscle spasm, but the benefit of short lasting, she continue have bilateral toe flexion, painful, left worse than right, there was no improvement in her left calf muscle spasm, and pain.  UPDATE Sept 14 2020: She responded well to previous injection to left shoulder, but recently she complains of worsening left shoulder pain, is planning on to have evaluation and x-ray by her primary care physician, she denies significant improvement to  the spasticity of her left lower extremity, was recently evaluated by Dr. Ollen Bowl, planning on to have baclofen placement, per patient, low-dose baclofen intrathecal trial produced  miracles,  REVIEW OF SYSTEMS: Full 14 system review of systems performed and notable only for as above All other review of systems were negative.  ALLERGIES: Allergies  Allergen Reactions   Morphine Other (See Comments)    HOME MEDICATIONS: Current Outpatient Medications  Medication Sig Dispense Refill   acetaminophen (TYLENOL) 650 MG CR tablet Take 1,300 mg by mouth.      acetaminophen-codeine (TYLENOL #3) 300-30 MG tablet TAKE 1 TABLET BY MOUTH EVERY 8 HOURS AS NEEDED FOR MODERATE PAIN (Patient taking differently: Take 2 tablets by mouth every 8 (eight) hours as needed. ) 90 tablet 5   albuterol (PROVENTIL) (2.5 MG/3ML) 0.083% nebulizer solution Take 2.5 mg by nebulization every 6 (six) hours as needed for wheezing or shortness of breath.     albuterol (VENTOLIN HFA) 108 (90 Base) MCG/ACT inhaler Inhale into the lungs every 6 (six) hours as needed for wheezing or shortness of breath.     Alemtuzumab (LEMTRADA) 12 MG/1.2ML SOLN Inject 12 mg into the vein.     baclofen (LIORESAL) 20 MG tablet Take up to 6 pills daily for MS spasticity 540 each 3   botulinum toxin Type A (BOTOX) 100 units SOLR injection Inject 600 Units into the muscle every 3 (three) months.     buPROPion (WELLBUTRIN XL) 300 MG 24 hr tablet Take by mouth.     Cholecalciferol (VITAMIN D-3) 5000 units TABS Take by mouth.     Cyanocobalamin (VITAMIN B-12) 2500 MCG SUBL Place under the tongue.     cyclobenzaprine (FLEXERIL) 10 MG tablet Take 1 tablet (10 mg total) by mouth 3 (three) times daily. 90 tablet 5   dalfampridine 10 MG TB12 One po bid 60 tablet 11   diazepam (VALIUM) 5 MG tablet Take up to 3 pills a day for spasticity 90 tablet 3   DULoxetine (CYMBALTA) 30 MG capsule 1 tablet daily for 2 weeks and then take 1 twice daily 60 capsule 3   ibuprofen (ADVIL,MOTRIN) 200 MG tablet Take 400 mg by mouth as needed.      montelukast (SINGULAIR) 10 MG tablet Take 10 mg by mouth at bedtime.     ondansetron  (ZOFRAN) 8 MG tablet as needed With infusions 20 tablet 1   ranitidine (ZANTAC) 300 MG capsule Take 300 mg by mouth as needed.      No current facility-administered medications for this visit.     PAST MEDICAL HISTORY: Past Medical History:  Diagnosis Date   Multiple sclerosis (HCC)    Vision abnormalities     PAST SURGICAL HISTORY: Past Surgical History:  Procedure Laterality Date   EXCISION MORTON'S NEUROMA Right     FAMILY HISTORY: Family History  Problem Relation Age of Onset   Diabetes Mother    Healthy Brother     SOCIAL HISTORY: Social History   Socioeconomic History   Marital status: Married    Spouse name: Not on file   Number of children: Not on file   Years of education: Not on file   Highest education level: Not on file  Occupational History   Not on file  Social Needs   Financial resource strain: Not on file   Food insecurity    Worry: Not on file    Inability: Not on file   Transportation needs    Medical: Not on file  Non-medical: Not on file  Tobacco Use   Smoking status: Current Every Day Smoker   Smokeless tobacco: Current User  Substance and Sexual Activity   Alcohol use: Yes    Comment: 2-3 times per wk   Drug use: Never   Sexual activity: Not on file  Lifestyle   Physical activity    Days per week: Not on file    Minutes per session: Not on file   Stress: Not on file  Relationships   Social connections    Talks on phone: Not on file    Gets together: Not on file    Attends religious service: Not on file    Active member of club or organization: Not on file    Attends meetings of clubs or organizations: Not on file    Relationship status: Not on file   Intimate partner violence    Fear of current or ex partner: Not on file    Emotionally abused: Not on file    Physically abused: Not on file    Forced sexual activity: Not on file  Other Topics Concern   Not on file  Social History Narrative   Right  handed    Caffeine: 1 cup or less per day- coffee   [employer]     PHYSICAL EXAM   Vitals:   02/09/19 0854  BP: 135/88  Pulse: 90  Temp: 98.6 F (37 C)    Not recorded      There is no height or weight on file to calculate BMI.   Fairly normal right arm and right lower extremity movement, mild left arm weakness, fixation with rapid rotating movement, proximal and distal strength 4+, limited range of motion of left shoulder, difficulty extending left fingers after grip,   moderate spasticity of left lower extremity, left hip flexion 3, left knee flexion 4, left knee extension 4+, left ankle dorsiflexion 4+, plantarflexion 5,  She needs assistance to get up from seated position, rely on her cane, hyperextended her left knee,  dragging her left leg across the floor, tends to hold left arm elbow flexion.  DIAGNOSTIC DATA (LABS, IMAGING, TESTING) - I reviewed patient records, labs, notes, testing and imaging myself where available.   ASSESSMENT AND PLAN  Donita BrooksChristy Skorupski is a 48 y.o. female    Relapsing Remitting Multiple Sclerosis, left spasticity, lower extremity more than left upper extremity.  We used BOTOX A 600 units today  Under EMG 200 units to left upper extremity, 300 units were injected into left lower extremity.  Left pectoralis major 100 units Left latissimus dorsi 100 units  Left tibialis posterior 50 units Left flexor digitorum longus 100 units  Left vastus medialis 25x2=50 units Left rectus femoris 25 units   Left vastus intermedius  25 units Left adductor longus 50 units   Patient tolerated injection well will return to clinic in 3 months for repeat injection  Levert FeinsteinYijun Sencere Symonette, M.D. Ph.D.  [facility] [address], [address] [place], [place] [address] Ph: [phone] Fax: [phone]  CC: Referring Provider

## 2019-02-09 NOTE — Progress Notes (Signed)
**  Botox 100 units x 6 units, NDC 2409-7353-29, Lot [id number], Exp 08/2021, specialty pharmacy.//mck,rn**

## 2019-02-11 DIAGNOSIS — Z87440 Personal history of urinary (tract) infections: Secondary | ICD-10-CM | POA: Diagnosis not present

## 2019-02-11 DIAGNOSIS — Z01419 Encounter for gynecological examination (general) (routine) without abnormal findings: Secondary | ICD-10-CM | POA: Diagnosis not present

## 2019-02-11 DIAGNOSIS — D225 Melanocytic nevi of trunk: Secondary | ICD-10-CM | POA: Diagnosis not present

## 2019-02-11 DIAGNOSIS — N3941 Urge incontinence: Secondary | ICD-10-CM | POA: Diagnosis not present

## 2019-02-11 DIAGNOSIS — L814 Other melanin hyperpigmentation: Secondary | ICD-10-CM | POA: Diagnosis not present

## 2019-02-12 DIAGNOSIS — G35 Multiple sclerosis: Secondary | ICD-10-CM | POA: Diagnosis not present

## 2019-02-12 DIAGNOSIS — H532 Diplopia: Secondary | ICD-10-CM | POA: Diagnosis not present

## 2019-02-12 DIAGNOSIS — F17210 Nicotine dependence, cigarettes, uncomplicated: Secondary | ICD-10-CM | POA: Diagnosis not present

## 2019-02-12 DIAGNOSIS — F418 Other specified anxiety disorders: Secondary | ICD-10-CM | POA: Diagnosis not present

## 2019-02-12 DIAGNOSIS — G8220 Paraplegia, unspecified: Secondary | ICD-10-CM | POA: Diagnosis not present

## 2019-02-13 ENCOUNTER — Telehealth: Payer: Self-pay | Admitting: Neurology

## 2019-02-13 DIAGNOSIS — G8220 Paraplegia, unspecified: Secondary | ICD-10-CM | POA: Diagnosis not present

## 2019-02-13 DIAGNOSIS — F418 Other specified anxiety disorders: Secondary | ICD-10-CM | POA: Diagnosis not present

## 2019-02-13 DIAGNOSIS — G35 Multiple sclerosis: Secondary | ICD-10-CM | POA: Diagnosis not present

## 2019-02-13 DIAGNOSIS — H532 Diplopia: Secondary | ICD-10-CM | POA: Diagnosis not present

## 2019-02-13 DIAGNOSIS — F17210 Nicotine dependence, cigarettes, uncomplicated: Secondary | ICD-10-CM | POA: Diagnosis not present

## 2019-02-13 NOTE — Telephone Encounter (Signed)
PT Sharon Odom w/ [facility] has called for verbal orders for a continuation of PT for pt starting the week of the 28th due to pt getting a baclofen pump. Twice a week for 4 weeks and one time a week for 4 weeks

## 2019-02-14 ENCOUNTER — Other Ambulatory Visit (HOSPITAL_COMMUNITY)
Admission: RE | Admit: 2019-02-14 | Discharge: 2019-02-14 | Disposition: A | Discharge status: Home / Self Care | Payer: BC Managed Care – PPO | Source: Ambulatory Visit | Attending: Anesthesiology | Admitting: Anesthesiology

## 2019-02-14 DIAGNOSIS — Z01812 Encounter for preprocedural laboratory examination: Secondary | ICD-10-CM | POA: Insufficient documentation

## 2019-02-14 DIAGNOSIS — M62838 Other muscle spasm: Secondary | ICD-10-CM | POA: Insufficient documentation

## 2019-02-14 DIAGNOSIS — Z20828 Contact with and (suspected) exposure to other viral communicable diseases: Secondary | ICD-10-CM | POA: Insufficient documentation

## 2019-02-15 LAB — NOVEL CORONAVIRUS, NAA (HOSP ORDER, SEND-OUT TO REF LAB; TAT 18-24 HRS): SARS-CoV-2, NAA: NOT DETECTED

## 2019-02-16 NOTE — Telephone Encounter (Signed)
Called, LVM and provided VO for PT per Dr. Felecia Shelling. He verbalized understanding.

## 2019-02-17 ENCOUNTER — Encounter (HOSPITAL_COMMUNITY): Payer: Self-pay | Admitting: *Deleted

## 2019-02-17 ENCOUNTER — Other Ambulatory Visit: Payer: Self-pay

## 2019-02-17 MED ORDER — CEFAZOLIN SODIUM-DEXTROSE 2-4 GM/100ML-% IV SOLN
2.0000 g | INTRAVENOUS | Status: AC
  Administered 2019-02-18: 2 g via INTRAVENOUS
  Filled 2019-02-17: qty 100

## 2019-02-17 NOTE — H&P (Signed)
Sharon Odom is an 48 y.o. female.   Chief Complaint: spasticity HPI: Very pleasant woman with history of multiple sclerosis, including spinal  Cord lesions in the upper cervical levels, with resultant spasticity.  She has particularly inhibited from activities of daily living, and it mobility because of spasticity in her lower extremities.  She has trialed various interventions with Dr. Felecia Shelling, but has not had any predictable or sustained improvements.  She was referred to me for  An intrathecal baclofen trial.  Please see her clinic note, but she had impressive trial with 100 micro g of intrathecal baclofen.   She was very pleased with her short-term improvement, and has requested implantation of a permanent intrathecal drug delivery device for continued baclofen therapy.  Past Medical History:  Diagnosis Date  . Multiple sclerosis ([place])   . Vision abnormalities     Past Surgical History:  Procedure Laterality Date  . EXCISION MORTON'S NEUROMA Right     Family History  Problem Relation Age of Onset  . Diabetes Mother   . Healthy Brother    Social History:  reports that she has been smoking. She uses smokeless tobacco. She reports current alcohol use. She reports that she does not use drugs.  Allergies:  Allergies  Allergen Reactions  . Morphine Other (See Comments)    Medications Prior to Admission  Medication Sig Dispense Refill  . acetaminophen (TYLENOL) 650 MG CR tablet Take 1,300 mg by mouth every 8 (eight) hours as needed for pain.     Marland Kitchen albuterol (VENTOLIN HFA) 108 (90 Base) MCG/ACT inhaler Inhale 1-2 puffs into the lungs every 6 (six) hours as needed for wheezing or shortness of breath.     . baclofen (LIORESAL) 20 MG tablet Take up to 6 pills daily for MS spasticity (Patient taking differently: Take 20 mg by mouth See admin instructions. Take up to 6 pills daily for MS spasticity) 540 each 3  . botulinum toxin Type A (BOTOX) 100 units SOLR injection Inject 600 Units into the  muscle every 3 (three) months.    Marland Kitchen buPROPion (WELLBUTRIN XL) 300 MG 24 hr tablet Take 300 mg by mouth daily.     . Cholecalciferol (VITAMIN D-3) 5000 units TABS Take 5,000 Units by mouth daily.     . Cyanocobalamin (VITAMIN B-12) 2500 MCG SUBL Place 2,500 mcg under the tongue daily.     . cyclobenzaprine (FLEXERIL) 10 MG tablet Take 1 tablet (10 mg total) by mouth 3 (three) times daily. 90 tablet 5  . dalfampridine 10 MG TB12 One po bid (Patient taking differently: Take 10 mg by mouth 2 (two) times daily. One po bid) 60 tablet 11  . diazepam (VALIUM) 5 MG tablet Take up to 3 pills a day for spasticity (Patient taking differently: Take 5-10 mg by mouth See admin instructions. Take up to 3 pills a day for spasticity) 90 tablet 3  . DULoxetine (CYMBALTA) 30 MG capsule 1 tablet daily for 2 weeks and then take 1 twice daily (Patient taking differently: Take 30 mg by mouth 2 (two) times daily. ) 60 capsule 3  . ibuprofen (ADVIL,MOTRIN) 200 MG tablet Take 400 mg by mouth every 8 (eight) hours as needed for moderate pain.     . montelukast (SINGULAIR) 10 MG tablet Take 10 mg by mouth at bedtime.    . ondansetron (ZOFRAN) 8 MG tablet as needed With infusions (Patient taking differently: Take 8 mg by mouth every 8 (eight) hours as needed for nausea or vomiting.  as needed With infusions) 20 tablet 1  . oxybutynin (DITROPAN-XL) 10 MG 24 hr tablet Take 10 mg by mouth daily.    . ranitidine (ZANTAC) 300 MG tablet Take 300 mg by mouth daily as needed for heartburn.    Marland Kitchen albuterol (PROVENTIL) (2.5 MG/3ML) 0.083% nebulizer solution Take 2.5 mg by nebulization every 6 (six) hours as needed for wheezing or shortness of breath.    . Alemtuzumab (LEMTRADA) 12 MG/1.2ML SOLN Inject 12 mg into the vein See admin instructions. Once a year      Results for orders placed or performed during the hospital encounter of 02/18/19 (from the past 48 hour(s))  CBC     Status: Abnormal   Collection Time: 02/18/19  6:25 AM  Result  Value Ref Range   WBC 7.8 4.0 - 10.5 K/uL   RBC 4.42 3.87 - 5.11 MIL/uL   Hemoglobin 14.6 12.0 - 15.0 g/dL   HCT 76.2 26.3 - 33.5 %   MCV 100.9 (H) 80.0 - 100.0 fL   MCH 33.0 26.0 - 34.0 pg   MCHC 32.7 30.0 - 36.0 g/dL   RDW 45.6 25.6 - 38.9 %   Platelets 268 150 - 400 K/uL   nRBC 0.0 0.0 - 0.2 %    Comment: Performed at [facility], [address] [place], [place] [address]   No results found.  Review of Systems  Constitutional: Negative.   HENT: Negative.   Eyes: Negative.   Respiratory: Negative.   Cardiovascular: Negative.   Gastrointestinal: Negative.   Genitourinary: Negative.   Musculoskeletal: Negative.   Skin: Negative.   Neurological: Negative.   Endo/Heme/Allergies: Negative.   Psychiatric/Behavioral: Negative.     Blood pressure 126/71, pulse 81, temperature 97.7 F (36.5 C), temperature source Oral, height 5\' 9"  (1.753 m), weight 93 kg, SpO2 98 %. Physical Exam  Constitutional: She is oriented to person, place, and time. She appears well-developed and well-nourished.  Eyes: Pupils are equal, round, and reactive to light. EOM are normal.  Neck: Normal range of motion.  Cardiovascular: Normal rate.  Respiratory: Effort normal.  GI: Soft.  Musculoskeletal: Normal range of motion.  Neurological: She is alert and oriented to person, place, and time.  Skin: Skin is warm and dry.  Psychiatric: She has a normal mood and affect. Her behavior is normal. Judgment normal.     Assessment/Plan Multiple Sclerosis Spasticity  PLAN: implant intrathecal drug delivery device, Medtronic; initiate continuous IT baclofen therapy.  , MD 02/18/2019, 7:23 AM

## 2019-02-17 NOTE — Progress Notes (Signed)
Pt denies SOB, chest pain, and being under the care of a cardiologist. PT denies having a stress test, echo and cardiac cath. Pt stated that labs were recently done at [facility], [place]; nurse requested results. EKG tracing on chart from [facility]. Pt made aware to stop taking Aspirin (unless otherwise advised by surgeon), vitamins, fish oil and herbal medications. Do not take any NSAIDs ie: Ibuprofen, Advil, Naproxen (Aleve), Motrin, BC and Goody Powder. Pt verbalized understanding of all pre-op instructions.

## 2019-02-17 NOTE — Anesthesia Preprocedure Evaluation (Addendum)
Anesthesia Evaluation  Patient identified by MRN, date of birth, ID band Patient awake    Reviewed: Allergy & Precautions, NPO status , Patient's Chart, lab work & pertinent test results  Airway Mallampati: II  TM Distance: >3 FB Neck ROM: Full    Dental no notable dental hx.    Pulmonary asthma , Current Smoker,    Pulmonary exam normal breath sounds clear to auscultation       Cardiovascular negative cardio ROS Normal cardiovascular exam Rhythm:Regular Rate:Normal     Neuro/Psych  Headaches, PSYCHIATRIC DISORDERS Anxiety Depression    GI/Hepatic Neg liver ROS, hiatal hernia, GERD  ,  Endo/Other  negative endocrine ROS  Renal/GU negative Renal ROS     Musculoskeletal  (+) Arthritis ,   Abdominal   Peds negative pediatric ROS (+)  Hematology negative hematology ROS (+)   Anesthesia Other Findings   Reproductive/Obstetrics negative OB ROS                            Anesthesia Physical Anesthesia Plan  ASA: III  Anesthesia Plan: General   Post-op Pain Management:    Induction: Intravenous  PONV Risk Score and Plan: 3 and Ondansetron, Dexamethasone and Treatment may vary due to age or medical condition  Airway Management Planned: Oral ETT  Additional Equipment: None  Intra-op Plan:   Post-operative Plan: Extubation in OR  Informed Consent: I have reviewed the patients History and Physical, chart, labs and discussed the procedure including the risks, benefits and alternatives for the proposed anesthesia with the patient or authorized representative who has indicated his/her understanding and acceptance.     Dental advisory given  Plan Discussed with: CRNA  Anesthesia Plan Comments:        Anesthesia Quick Evaluation

## 2019-02-18 ENCOUNTER — Ambulatory Visit (HOSPITAL_COMMUNITY)
Admission: RE | Admit: 2019-02-18 | Discharge: 2019-02-18 | Disposition: A | Discharge status: Home / Self Care | Payer: BC Managed Care – PPO | Attending: Anesthesiology | Admitting: Anesthesiology

## 2019-02-18 ENCOUNTER — Encounter (HOSPITAL_COMMUNITY)
Admission: RE | Disposition: A | Discharge status: Home / Self Care | Payer: Self-pay | Source: Home / Self Care | Attending: Anesthesiology

## 2019-02-18 ENCOUNTER — Ambulatory Visit (HOSPITAL_COMMUNITY): Payer: BC Managed Care – PPO

## 2019-02-18 ENCOUNTER — Ambulatory Visit (HOSPITAL_COMMUNITY): Payer: BC Managed Care – PPO | Admitting: Certified Registered Nurse Anesthetist

## 2019-02-18 ENCOUNTER — Encounter (HOSPITAL_COMMUNITY): Payer: Self-pay | Admitting: *Deleted

## 2019-02-18 ENCOUNTER — Other Ambulatory Visit: Payer: Self-pay

## 2019-02-18 DIAGNOSIS — Z451 Encounter for adjustment and management of infusion pump: Secondary | ICD-10-CM | POA: Diagnosis not present

## 2019-02-18 DIAGNOSIS — M62838 Other muscle spasm: Secondary | ICD-10-CM | POA: Diagnosis not present

## 2019-02-18 DIAGNOSIS — F17290 Nicotine dependence, other tobacco product, uncomplicated: Secondary | ICD-10-CM | POA: Diagnosis not present

## 2019-02-18 DIAGNOSIS — Z79899 Other long term (current) drug therapy: Secondary | ICD-10-CM | POA: Insufficient documentation

## 2019-02-18 DIAGNOSIS — J45909 Unspecified asthma, uncomplicated: Secondary | ICD-10-CM | POA: Insufficient documentation

## 2019-02-18 DIAGNOSIS — G35 Multiple sclerosis: Secondary | ICD-10-CM | POA: Insufficient documentation

## 2019-02-18 DIAGNOSIS — K219 Gastro-esophageal reflux disease without esophagitis: Secondary | ICD-10-CM | POA: Diagnosis not present

## 2019-02-18 DIAGNOSIS — F329 Major depressive disorder, single episode, unspecified: Secondary | ICD-10-CM | POA: Insufficient documentation

## 2019-02-18 DIAGNOSIS — Z419 Encounter for procedure for purposes other than remedying health state, unspecified: Secondary | ICD-10-CM

## 2019-02-18 DIAGNOSIS — Z981 Arthrodesis status: Secondary | ICD-10-CM | POA: Diagnosis not present

## 2019-02-18 DIAGNOSIS — M1990 Unspecified osteoarthritis, unspecified site: Secondary | ICD-10-CM | POA: Insufficient documentation

## 2019-02-18 DIAGNOSIS — F419 Anxiety disorder, unspecified: Secondary | ICD-10-CM | POA: Insufficient documentation

## 2019-02-18 DIAGNOSIS — Z885 Allergy status to narcotic agent status: Secondary | ICD-10-CM | POA: Diagnosis not present

## 2019-02-18 HISTORY — DX: Allergy, unspecified, initial encounter: T78.40XA

## 2019-02-18 HISTORY — DX: Anxiety disorder, unspecified: F41.9

## 2019-02-18 HISTORY — DX: Constipation, unspecified: K59.00

## 2019-02-18 HISTORY — DX: Depression, unspecified: F32.A

## 2019-02-18 HISTORY — DX: Unspecified osteoarthritis, unspecified site: M19.90

## 2019-02-18 HISTORY — DX: Personal history of other diseases of the digestive system: Z87.19

## 2019-02-18 HISTORY — DX: Headache, unspecified: R51.9

## 2019-02-18 HISTORY — DX: Presence of spectacles and contact lenses: Z97.3

## 2019-02-18 HISTORY — DX: Unspecified asthma, uncomplicated: J45.909

## 2019-02-18 HISTORY — PX: INTRATHECAL PUMP IMPLANT: SHX6809

## 2019-02-18 HISTORY — DX: Gastro-esophageal reflux disease without esophagitis: K21.9

## 2019-02-18 LAB — CBC
HCT: 44.6 % (ref 36.0–46.0)
Hemoglobin: 14.6 g/dL (ref 12.0–15.0)
MCH: 33 pg (ref 26.0–34.0)
MCHC: 32.7 g/dL (ref 30.0–36.0)
MCV: 100.9 fL — ABNORMAL HIGH (ref 80.0–100.0)
Platelets: 268 10*3/uL (ref 150–400)
RBC: 4.42 MIL/uL (ref 3.87–5.11)
RDW: 12.9 % (ref 11.5–15.5)
WBC: 7.8 10*3/uL (ref 4.0–10.5)
nRBC: 0 % (ref 0.0–0.2)

## 2019-02-18 SURGERY — INTRATHECAL PUMP IMPLANT
Anesthesia: General | Laterality: Right

## 2019-02-18 MED ORDER — 0.9 % SODIUM CHLORIDE (POUR BTL) OPTIME
TOPICAL | Status: DC | PRN
  Administered 2019-02-18: 1000 mL

## 2019-02-18 MED ORDER — DEXAMETHASONE SODIUM PHOSPHATE 10 MG/ML IJ SOLN
INTRAMUSCULAR | Status: AC
  Filled 2019-02-18: qty 1

## 2019-02-18 MED ORDER — SODIUM CHLORIDE 0.9 % IV SOLN
INTRAVENOUS | Status: DC | PRN
  Administered 2019-02-18: 500 mL

## 2019-02-18 MED ORDER — GLYCOPYRROLATE PF 0.2 MG/ML IJ SOSY
PREFILLED_SYRINGE | INTRAMUSCULAR | Status: AC
Start: 2019-02-18 — End: ?
  Filled 2019-02-18: qty 2

## 2019-02-18 MED ORDER — OXYCODONE-ACETAMINOPHEN 10-325 MG PO TABS: 1.0000 | ORAL_TABLET | ORAL | 0 refills | Status: AC | PRN

## 2019-02-18 MED ORDER — MIDAZOLAM HCL 2 MG/2ML IJ SOLN
INTRAMUSCULAR | Status: DC | PRN
  Administered 2019-02-18 (×2): 1 mg via INTRAVENOUS

## 2019-02-18 MED ORDER — BUPIVACAINE-EPINEPHRINE (PF) 0.5% -1:200000 IJ SOLN
INTRAMUSCULAR | Status: AC
  Filled 2019-02-18: qty 30

## 2019-02-18 MED ORDER — PROPOFOL 10 MG/ML IV BOLUS
INTRAVENOUS | Status: AC
  Filled 2019-02-18: qty 40

## 2019-02-18 MED ORDER — CHLORHEXIDINE GLUCONATE CLOTH 2 % EX PADS: 6.0000 | MEDICATED_PAD | Freq: Once | CUTANEOUS | Status: DC

## 2019-02-18 MED ORDER — ONDANSETRON HCL 4 MG/2ML IJ SOLN
INTRAMUSCULAR | Status: DC | PRN
  Administered 2019-02-18: 4 mg via INTRAVENOUS

## 2019-02-18 MED ORDER — ROCURONIUM BROMIDE 10 MG/ML (PF) SYRINGE
PREFILLED_SYRINGE | INTRAVENOUS | Status: AC
  Filled 2019-02-18: qty 10

## 2019-02-18 MED ORDER — MEPERIDINE HCL 25 MG/ML IJ SOLN: 6.2500 mg | INTRAMUSCULAR | Status: DC | PRN

## 2019-02-18 MED ORDER — BUPIVACAINE-EPINEPHRINE 0.5% -1:200000 IJ SOLN
INTRAMUSCULAR | Status: DC | PRN
  Administered 2019-02-18: 25 mL

## 2019-02-18 MED ORDER — HYDROMORPHONE HCL 1 MG/ML IJ SOLN
0.2500 mg | INTRAMUSCULAR | Status: DC | PRN
  Administered 2019-02-18: 10:00:00 0.5 mg via INTRAVENOUS

## 2019-02-18 MED ORDER — NEOSTIGMINE METHYLSULFATE 3 MG/3ML IV SOSY
PREFILLED_SYRINGE | INTRAVENOUS | Status: AC
  Filled 2019-02-18: qty 3

## 2019-02-18 MED ORDER — BACLOFEN 10 MG/20ML IT SOLN
20.0000 mg | Freq: Once | INTRATHECAL | Status: DC
  Filled 2019-02-18: qty 40

## 2019-02-18 MED ORDER — ONDANSETRON HCL 4 MG/2ML IJ SOLN
INTRAMUSCULAR | Status: AC
  Filled 2019-02-18: qty 2

## 2019-02-18 MED ORDER — PROPOFOL 10 MG/ML IV BOLUS
INTRAVENOUS | Status: DC | PRN
  Administered 2019-02-18: 20 mg via INTRAVENOUS
  Administered 2019-02-18: 50 mg via INTRAVENOUS
  Administered 2019-02-18: 70 mg via INTRAVENOUS

## 2019-02-18 MED ORDER — OXYCODONE HCL 5 MG PO TABS
5.0000 mg | ORAL_TABLET | Freq: Once | ORAL | Status: AC | PRN
  Administered 2019-02-18: 5 mg via ORAL

## 2019-02-18 MED ORDER — FENTANYL CITRATE (PF) 250 MCG/5ML IJ SOLN
INTRAMUSCULAR | Status: AC
  Filled 2019-02-18: qty 5

## 2019-02-18 MED ORDER — BACLOFEN 40 MG/20ML IT SOLN
INTRATHECAL | Status: DC | PRN
  Administered 2019-02-18: 40 mg via INTRATHECAL

## 2019-02-18 MED ORDER — PROMETHAZINE HCL 25 MG/ML IJ SOLN: 6.2500 mg | INTRAMUSCULAR | Status: DC | PRN

## 2019-02-18 MED ORDER — MIDAZOLAM HCL 2 MG/2ML IJ SOLN
INTRAMUSCULAR | Status: AC
  Filled 2019-02-18: qty 2

## 2019-02-18 MED ORDER — FENTANYL CITRATE (PF) 250 MCG/5ML IJ SOLN
INTRAMUSCULAR | Status: DC | PRN
  Administered 2019-02-18: 25 ug via INTRAVENOUS
  Administered 2019-02-18: 100 ug via INTRAVENOUS
  Administered 2019-02-18: 25 ug via INTRAVENOUS
  Administered 2019-02-18: 100 ug via INTRAVENOUS

## 2019-02-18 MED ORDER — GLYCOPYRROLATE PF 0.2 MG/ML IJ SOSY
PREFILLED_SYRINGE | INTRAMUSCULAR | Status: AC
  Filled 2019-02-18: qty 1

## 2019-02-18 MED ORDER — BACLOFEN 20 MG PO TABS: ORAL_TABLET | ORAL | 3 refills | Status: DC

## 2019-02-18 MED ORDER — GLYCOPYRROLATE 0.2 MG/ML IJ SOLN
INTRAMUSCULAR | Status: DC | PRN
  Administered 2019-02-18: 0.4 mg via INTRAVENOUS

## 2019-02-18 MED ORDER — OXYCODONE HCL 5 MG/5ML PO SOLN: 5.0000 mg | Freq: Once | ORAL | Status: AC | PRN

## 2019-02-18 MED ORDER — ROCURONIUM BROMIDE 10 MG/ML (PF) SYRINGE
PREFILLED_SYRINGE | INTRAVENOUS | Status: DC | PRN
  Administered 2019-02-18: 50 mg via INTRAVENOUS

## 2019-02-18 MED ORDER — LACTATED RINGERS IV SOLN
INTRAVENOUS | Status: DC | PRN
  Administered 2019-02-18: 07:00:00 via INTRAVENOUS

## 2019-02-18 MED ORDER — NEOSTIGMINE METHYLSULFATE 10 MG/10ML IV SOLN
INTRAVENOUS | Status: DC | PRN
  Administered 2019-02-18: 3 mg via INTRAVENOUS

## 2019-02-18 MED ORDER — LIDOCAINE 2% (20 MG/ML) 5 ML SYRINGE
INTRAMUSCULAR | Status: AC
  Filled 2019-02-18: qty 5

## 2019-02-18 MED ORDER — OXYCODONE HCL 5 MG PO TABS
ORAL_TABLET | ORAL | Status: AC
  Filled 2019-02-18: qty 1

## 2019-02-18 MED ORDER — LIDOCAINE 2% (20 MG/ML) 5 ML SYRINGE
INTRAMUSCULAR | Status: DC | PRN
  Administered 2019-02-18: 100 mg via INTRAVENOUS

## 2019-02-18 MED ORDER — SODIUM CHLORIDE 0.9 % IV SOLN
0.0125 ug/kg/min | INTRAVENOUS | Status: DC
  Filled 2019-02-18: qty 2000

## 2019-02-18 MED ORDER — HYDROMORPHONE HCL 1 MG/ML IJ SOLN
INTRAMUSCULAR | Status: AC
  Administered 2019-02-18: 0.5 mg via INTRAVENOUS
  Filled 2019-02-18: qty 1

## 2019-02-18 MED ORDER — DEXAMETHASONE SODIUM PHOSPHATE 10 MG/ML IJ SOLN
INTRAMUSCULAR | Status: DC | PRN
  Administered 2019-02-18: 10 mg via INTRAVENOUS

## 2019-02-18 SURGICAL SUPPLY — 61 items
BAG DECANTER FOR FLEXI CONT (MISCELLANEOUS) ×2 IMPLANT
BINDER ABDOMINAL 12 ML 46-62 (SOFTGOODS) ×2 IMPLANT
BLADE CLIPPER SURG (BLADE) ×2 IMPLANT
BOOT SUTURE AID YELLOW STND (SUTURE) ×2 IMPLANT
CATH ASCENDA 1PIECE (Catheter) ×2 IMPLANT
CHLORAPREP W/TINT 26 (MISCELLANEOUS) ×2 IMPLANT
COVER WAND RF STERILE (DRAPES) ×2 IMPLANT
DERMABOND ADVANCED (GAUZE/BANDAGES/DRESSINGS) ×2
DERMABOND ADVANCED .7 DNX12 (GAUZE/BANDAGES/DRESSINGS) ×2 IMPLANT
DRAPE C-ARMOR (DRAPES) ×2 IMPLANT
DRAPE INCISE IOBAN 66X45 STRL (DRAPES) IMPLANT
DRAPE LAPAROTOMY T 102X78X121 (DRAPES) IMPLANT
DRAPE ORTHO SPLIT 77X108 STRL (DRAPES) ×2
DRAPE SURG 17X23 STRL (DRAPES) ×10 IMPLANT
DRAPE SURG ORHT 6 SPLT 77X108 (DRAPES) ×2 IMPLANT
DRSG OPSITE POSTOP 3X4 (GAUZE/BANDAGES/DRESSINGS) ×2 IMPLANT
DRSG OPSITE POSTOP 4X6 (GAUZE/BANDAGES/DRESSINGS) ×2 IMPLANT
ELECT REM PT RETURN 9FT ADLT (ELECTROSURGICAL) ×2
ELECTRODE REM PT RTRN 9FT ADLT (ELECTROSURGICAL) ×1 IMPLANT
GAUZE 4X4 16PLY RFD (DISPOSABLE) ×2 IMPLANT
GLOVE BIOGEL PI IND STRL 7.0 (GLOVE) ×3 IMPLANT
GLOVE BIOGEL PI IND STRL 7.5 (GLOVE) ×1 IMPLANT
GLOVE BIOGEL PI INDICATOR 7.0 (GLOVE) ×3
GLOVE BIOGEL PI INDICATOR 7.5 (GLOVE) ×1
GLOVE ECLIPSE 7.5 STRL STRAW (GLOVE) ×2 IMPLANT
GLOVE SURG SS PI 7.5 STRL IVOR (GLOVE) ×2 IMPLANT
GOWN STRL REUS W/ TWL LRG LVL3 (GOWN DISPOSABLE) ×3 IMPLANT
GOWN STRL REUS W/ TWL XL LVL3 (GOWN DISPOSABLE) IMPLANT
GOWN STRL REUS W/TWL 2XL LVL3 (GOWN DISPOSABLE) IMPLANT
GOWN STRL REUS W/TWL LRG LVL3 (GOWN DISPOSABLE) ×3
GOWN STRL REUS W/TWL XL LVL3 (GOWN DISPOSABLE)
KIT BASIN OR (CUSTOM PROCEDURE TRAY) ×2 IMPLANT
KIT REFILL (MISCELLANEOUS) ×1
KIT REFILL CATH SYNCHROMED II (MISCELLANEOUS) ×1 IMPLANT
KIT TURNOVER KIT B (KITS) ×2 IMPLANT
NEEDLE HYPO 18GX1.5 BLUNT FILL (NEEDLE) ×4 IMPLANT
NEEDLE HYPO 25X1 1.5 SAFETY (NEEDLE) ×2 IMPLANT
NS IRRIG 1000ML POUR BTL (IV SOLUTION) ×2 IMPLANT
PACK LAMINECTOMY NEURO (CUSTOM PROCEDURE TRAY) ×2 IMPLANT
PAD ARMBOARD 7.5X6 YLW CONV (MISCELLANEOUS) IMPLANT
PASSER CATH 38CM DISP (INSTRUMENTS) ×2 IMPLANT
POUCH TYRX NEURO LRG (Mesh General) ×2 IMPLANT
PUMP SYNCHROMED II 40ML RESVR (Neuro Prosthesis/Implant) ×2 IMPLANT
SPONGE LAP 4X18 RFD (DISPOSABLE) IMPLANT
SPONGE SURGIFOAM ABS GEL SZ50 (HEMOSTASIS) IMPLANT
STAPLER SKIN PROX WIDE 3.9 (STAPLE) ×2 IMPLANT
SUT MNCRL AB 4-0 PS2 18 (SUTURE) ×4 IMPLANT
SUT SILK 0 (SUTURE) ×1
SUT SILK 0 MO-6 18XCR BRD 8 (SUTURE) ×1 IMPLANT
SUT SILK 0 TIES 10X30 (SUTURE) IMPLANT
SUT SILK 2 0 TIES 10X30 (SUTURE) IMPLANT
SUT VIC AB 1 CT1 18XBRD ANBCTR (SUTURE) ×1 IMPLANT
SUT VIC AB 1 CT1 8-18 (SUTURE) ×1
SUT VIC AB 2-0 CP2 18 (SUTURE) ×4 IMPLANT
SYR 10ML LL (SYRINGE) ×2 IMPLANT
SYR 20ML LL LF (SYRINGE) ×4 IMPLANT
SYR 3ML LL SCALE MARK (SYRINGE) IMPLANT
TOWEL GREEN STERILE (TOWEL DISPOSABLE) ×2 IMPLANT
TOWEL GREEN STERILE FF (TOWEL DISPOSABLE) ×2 IMPLANT
WATER STERILE IRR 1000ML POUR (IV SOLUTION) ×2 IMPLANT
YANKAUER SUCT BULB TIP NO VENT (SUCTIONS) ×2 IMPLANT

## 2019-02-18 NOTE — Op Note (Signed)
PREOP DX: 1)spasticity 2) multiple sclerosis POSTOP DX:1) spasticity 2) multiple sclerosis  PROCEDURES: 1) implantation of IT catheter SN [id number] 2) implantation of IT pump SN [id number] 4) IT pump programming  SURGEON: Seaborn Nakama ASSISTANT: none ANESTHESIA: GETA  EBL: <20cc  DESCRIPTION OF PROCEDURE: After a discussion of risks, benefits and alternatives, informed consent was obtained. The patient was taken to the OR, a plane of general anesthesia induced, and the patient intubated by the anesthesiology staff. The patient was then positioned in a right lateral decubitus position on the OR table, an axillary roll placed, and extreme care taken to position the patient so that all pressure points were padded, limbs and head all in a neutral position. The patient's abdomen, flank and lumbar spine were prepped with betadine scrub and chloraprep, and draped into a sterile field. A timeout was taken to verify the appropriate patient, position, procedure, availability of equipment and personnel, and administration of perioperative antibiotics.   A lumbar incision was planned at the L3-4 level with the use of the fluoroscope. The skin was incised with a 10 blade, and careful sharp dissection carried down to the dorsolumbar fascia with the bovie electrocautery. A 14-gauge Touhy needle was then introduced and advanced under fluoroscopic guidance with a gradual approach to the L1-2 interspace. When clear CSF was obtained then a Medtronic intrathecal catheter was introduced and seen to advance into the posterior elements of the spinal space. Again with the use of the fluoroscope as guidance, the catheter was advanced to its distal end was at the level of the T7 vertebral body. Rubber-shod clamp was placed at the end of the catheter, and an 0 silk suture was placed in a pursestring type fashion about the needle. The needle was then backed off the catheter, and the pursestring tied to prevent further CSF  extravasation. Good flow of CSF was noted from the distal end of the catheter. Fixation device was then threaded over the catheter, and then used to affix the catheter to the dorsolumbar fascia again using the same previously placed pursestring suture, and an additional 0 silk interrupted suture.    Attention was then turned to left lower abdominal quadrant, and the creation of a subcutaneous pocket for pump implantation. An incision was made, and the subcutaneous pocket created with blunt dissection. The pocket was carefully inspected, and hemostasis was assured. A reverse Seldinger technique was then utilized to pass the intrathecal catheter to the pump pocket.   During the creation of the subcutaneous tissues pocket, on the back table Lioresal (preservative free baclofen) 500 mcg/mL was instilled into the pump after appropriate preparation. The catheter itself was then attached to a side port assembly, and then attached to the side port of the pump itself. CSF flow was maintained throughout all steps, and from the side aspiration port itself at the conclusion of all the connections being made.    With now a functional pump/catheter assembly, the pocket was re-inspected and found to be hemostatic. 0 silk sutures were placed in the medial and lateral corners of the incision, and another interrupted 2--0 silk suture placed in the inferolateral aspect of the pocket. The residual catheter was carefully coiled on the posterior face of the pump, and these sutures were then placed through the retention angles of the pump itself, with care taken to not incorporate or tie off the catheter into the sutures when they were tied; the pump was fixed into place with the sutures in an orientation with the side port  directed cephalad, or 12 o'clock position. An antiobiotic-eluting mesh patch was placed posterior to the pump itself  The lumbar incision was then copiously irrigated with bacitracin-containing  irrigation, and the incision closed with a deep layer of 2-0 interrupted vicryl sutures, and the skin closed with running 3-0 subcuticular monocryl and dermabond.    The pocket was then copiously irrigated with bacitracin-containing irrigation, and the incision closed with a deep layer of 1-0 interrupted vicryl sutures, and the skin closed with running 3-0 subcuticular monocryl and dermabond. Sterile, occlusive dressings were placed. Needle, instrument and sponge counts were correct x2 at the end of the case.   The pump was programmed then to run a simple continuous infusion of of baclofen at a total of 125 mcg/24 hours.  The patient was turned onto a stretcher, extubated by the anesthesiology team, and taken to PACU in stable condition.   DRAINS: none  COMPLICATIONS: none CONDITION: stable throughout, and to the PACU  DISPOSITION: plan PACU discharge. Follow up in 2 weeks for pump adjustment and wound check. Case discussed with the patient's family.

## 2019-02-18 NOTE — Discharge Instructions (Addendum)

## 2019-02-18 NOTE — Transfer of Care (Signed)
Immediate Anesthesia Transfer of Care Note  Patient: Sharon Odom  Procedure(s) Performed: INTRATHECAL PUMP IMPLANT (Right )  Patient Location: PACU  Anesthesia Type:General  Level of Consciousness: awake, oriented, patient cooperative and responds to stimulation  Airway & Oxygen Therapy: Patient Spontanous Breathing and Patient connected to nasal cannula oxygen  Post-op Assessment: Report given to RN, Post -op Vital signs reviewed and stable and Patient moving all extremities X 4  Post vital signs: Reviewed and stable  Last Vitals:  Vitals Value Taken Time  BP 126/77 02/18/19 0940  Temp    Pulse 99 02/18/19 0946  Resp 18 02/18/19 0946  SpO2 96 % 02/18/19 0946  Vitals shown include unvalidated device data.  Last Pain:  Vitals:   02/18/19 0607  TempSrc:   PainSc: 6       Patients Stated Pain Goal: 4 (16/10/96 0454)  Complications: No apparent anesthesia complications

## 2019-02-18 NOTE — Anesthesia Postprocedure Evaluation (Signed)
Anesthesia Post Note  Patient: Sharon Odom  Procedure(s) Performed: INTRATHECAL PUMP IMPLANT (Right )     Patient location during evaluation: PACU Anesthesia Type: General Level of consciousness: sedated and patient cooperative Pain management: pain level controlled Vital Signs Assessment: post-procedure vital signs reviewed and stable Respiratory status: spontaneous breathing Cardiovascular status: stable Anesthetic complications: no    Last Vitals:  Vitals:   02/18/19 1055 02/18/19 1110  BP:  124/76  Pulse:  88  Resp:  20  Temp: 36.8 C   SpO2:  94%    Last Pain:  Vitals:   02/18/19 1055  TempSrc:   PainSc: [place]

## 2019-02-18 NOTE — Progress Notes (Signed)
Wasted dilaudid .5mg  IV in stericycle. Witnessed by Orie Fisherman RN

## 2019-02-19 ENCOUNTER — Encounter (HOSPITAL_COMMUNITY): Payer: Self-pay | Admitting: Anesthesiology

## 2019-03-04 DIAGNOSIS — G35 Multiple sclerosis: Secondary | ICD-10-CM | POA: Diagnosis not present

## 2019-03-04 DIAGNOSIS — M62838 Other muscle spasm: Secondary | ICD-10-CM | POA: Diagnosis not present

## 2019-03-05 DIAGNOSIS — M6281 Muscle weakness (generalized): Secondary | ICD-10-CM | POA: Diagnosis not present

## 2019-03-05 DIAGNOSIS — D849 Immunodeficiency, unspecified: Secondary | ICD-10-CM | POA: Diagnosis not present

## 2019-03-05 DIAGNOSIS — R1312 Dysphagia, oropharyngeal phase: Secondary | ICD-10-CM | POA: Diagnosis not present

## 2019-03-05 DIAGNOSIS — G894 Chronic pain syndrome: Secondary | ICD-10-CM | POA: Diagnosis not present

## 2019-03-06 ENCOUNTER — Other Ambulatory Visit: Payer: Self-pay | Admitting: Neurosurgery

## 2019-03-09 ENCOUNTER — Other Ambulatory Visit: Payer: Self-pay | Admitting: Neurosurgery

## 2019-03-09 DIAGNOSIS — G35 Multiple sclerosis: Secondary | ICD-10-CM

## 2019-03-09 DIAGNOSIS — F17210 Nicotine dependence, cigarettes, uncomplicated: Secondary | ICD-10-CM | POA: Diagnosis not present

## 2019-03-09 DIAGNOSIS — F418 Other specified anxiety disorders: Secondary | ICD-10-CM | POA: Diagnosis not present

## 2019-03-09 DIAGNOSIS — H532 Diplopia: Secondary | ICD-10-CM | POA: Diagnosis not present

## 2019-03-09 DIAGNOSIS — G8220 Paraplegia, unspecified: Secondary | ICD-10-CM | POA: Diagnosis not present

## 2019-03-11 DIAGNOSIS — G8220 Paraplegia, unspecified: Secondary | ICD-10-CM | POA: Diagnosis not present

## 2019-03-11 DIAGNOSIS — F418 Other specified anxiety disorders: Secondary | ICD-10-CM | POA: Diagnosis not present

## 2019-03-11 DIAGNOSIS — H532 Diplopia: Secondary | ICD-10-CM | POA: Diagnosis not present

## 2019-03-11 DIAGNOSIS — G35 Multiple sclerosis: Secondary | ICD-10-CM | POA: Diagnosis not present

## 2019-03-11 DIAGNOSIS — F17210 Nicotine dependence, cigarettes, uncomplicated: Secondary | ICD-10-CM | POA: Diagnosis not present

## 2019-03-12 ENCOUNTER — Encounter: Payer: Self-pay | Admitting: Neurology

## 2019-03-12 DIAGNOSIS — G35 Multiple sclerosis: Secondary | ICD-10-CM | POA: Diagnosis not present

## 2019-03-12 DIAGNOSIS — M62838 Other muscle spasm: Secondary | ICD-10-CM | POA: Diagnosis not present

## 2019-03-12 DIAGNOSIS — Z9689 Presence of other specified functional implants: Secondary | ICD-10-CM | POA: Diagnosis not present

## 2019-03-16 DIAGNOSIS — G35 Multiple sclerosis: Secondary | ICD-10-CM | POA: Diagnosis not present

## 2019-03-16 DIAGNOSIS — G8220 Paraplegia, unspecified: Secondary | ICD-10-CM | POA: Diagnosis not present

## 2019-03-16 DIAGNOSIS — F17210 Nicotine dependence, cigarettes, uncomplicated: Secondary | ICD-10-CM | POA: Diagnosis not present

## 2019-03-16 DIAGNOSIS — F418 Other specified anxiety disorders: Secondary | ICD-10-CM | POA: Diagnosis not present

## 2019-03-16 DIAGNOSIS — H532 Diplopia: Secondary | ICD-10-CM | POA: Diagnosis not present

## 2019-03-19 ENCOUNTER — Encounter: Payer: Self-pay | Admitting: Neurology

## 2019-03-19 ENCOUNTER — Other Ambulatory Visit: Payer: Self-pay

## 2019-03-19 ENCOUNTER — Telehealth: Payer: Self-pay | Admitting: *Deleted

## 2019-03-19 ENCOUNTER — Ambulatory Visit (INDEPENDENT_AMBULATORY_CARE_PROVIDER_SITE_OTHER): Payer: BC Managed Care – PPO | Admitting: Neurology

## 2019-03-19 VITALS — BP 118/80 | HR 80 | Temp 98.0°F | Ht 69.0 in

## 2019-03-19 DIAGNOSIS — G35 Multiple sclerosis: Secondary | ICD-10-CM | POA: Diagnosis not present

## 2019-03-19 DIAGNOSIS — R269 Unspecified abnormalities of gait and mobility: Secondary | ICD-10-CM

## 2019-03-19 DIAGNOSIS — G8220 Paraplegia, unspecified: Secondary | ICD-10-CM

## 2019-03-19 DIAGNOSIS — Z9689 Presence of other specified functional implants: Secondary | ICD-10-CM | POA: Diagnosis not present

## 2019-03-19 DIAGNOSIS — Z79899 Other long term (current) drug therapy: Secondary | ICD-10-CM

## 2019-03-19 DIAGNOSIS — M62838 Other muscle spasm: Secondary | ICD-10-CM

## 2019-03-19 DIAGNOSIS — Z978 Presence of other specified devices: Secondary | ICD-10-CM | POA: Insufficient documentation

## 2019-03-19 MED ORDER — OXYCODONE-ACETAMINOPHEN 10-325 MG PO TABS: 1.0000 | ORAL_TABLET | Freq: Two times a day (BID) | ORAL | 0 refills | Status: DC | PRN

## 2019-03-19 NOTE — Telephone Encounter (Signed)
Faxed new orders for monthly Lemtrada labs to [facility] at [phone]. This way the pt can go back to home draw program since she is non ambulatory and it is difficult for her to go out to get labs. Received fax confirmation. Also emailed Tanda Rockers ([employer] pt support services) to let her know we faxed the orders and to let us know if anything else is needed.

## 2019-03-19 NOTE — Progress Notes (Signed)
[facility]  PATIENT: Sharon Odom DOB: [birth year]  REFERRING DOCTOR OR PCP:  Claretta Fraise SOURCE: Patient, notes imaging and lab reports, MRI images on CD from neurology,  _________________________________   HISTORICAL   CHIEF COMPLAINT:  Chief Complaint  Patient presents with  . Follow-up    RM 13 with husband (temp: 97.8). Last seen 12/15/2018. Has MRI brain/cervical/thoracic next week on 03/25/19 at [facility] imaging (ordered by Dr. Norville Haggard) Dr. Norville Haggard thinks she is having an exacerbation. In [facility] in office today.   . Multiple Sclerosis    Baclofen pump placed 02/18/2019. She has weekly f/u for this right now. Last Thursday, dose was increased to 215. Trial was really good but now that she has pump her legs are stiff. She also has a lot of pain. She is currently taking oxycodone prescribed after her surgery. She is wanting to discuss getting refill from Dr. Epimenio Foot.    HISTORY OF PRESENT ILLNESS: Sharon Odom is a 48 y.o. woman with multiple sclerosis, left greater than right spasticity and poor gait.  Update 03/19/2019: Her baclofen trial went well.   She had the baclofen pump placed 02/18/2019 and dose is being increased (now 215 mcg/day).  Her legs are stiff.   She reports Dr. Ollen Bowl feels she might be having an exacerbation and has ordered MRI's.   She is taking baclofen pills 4 times a day (was taking 6 times a day).   Diazepam has not helped the spasticity though helped the anxiety.     Currently she is having hand spasticity on the left and left > right leg spasticity.     She had her last Lemtrada October 2019 and we will try to get labwork form home   Update 12/15/2018: She has had Lemtrade x 3 courses for her MS.  Last dose was October 2019.   Lymphocyte count 11/2018 was 0.9.    She has no exacerbations.   She is noting more spasticity in her right leg than before and spasticity on the left is about the same.      She has severe spasticity and has tried  Botox several courses without much benefit for the legs but she had some benefit in the left arm.   We discussed the baclofen pump.    Currently she is on baclofen 120 mg and valium  a day.   Tizanidine had not helped her in the past.    She is noting more fatigue.    She has some anxiety, helped by valium.     She also has dysesthesias that were better with Lyrica but she had weight gain and constipation and stopped.     She started Cymbalta.    Lamotrigine was not well tolerated.      Update 09/18/2018: She had her third year of Lemtrada in October 2019.  She tolerated it well.  CD4 count was 66 December 2019.  Most recent lymphocyte count is 0.6.  We had a conversation about COVID and [place].  As her lymphocytes are recovering, her risk is probably just slightly worse than it would be otherwise.  However, if the pandemic is still ongoing in a significant way in the late fall, we may want to push back any additional dosing.  She feels her MS is doing about the same with no new exacerbations though she has continued impairments, left > right.    She has severe spasticity and poor gait.     She had Botox 08/06/2018 (Dr. Terrace Arabia)  and she feel she got a good benefit at first with almost no spasms for a few days.   She is noting less stiffness in her thigh.  However, the spasticity in her lower leg and foot is not better. Calf still has severe spasticity.  She had 400 Units in the leg and 200 units in the arm.   She did not get a benefit in the arm.    She is having more pain in the left arm and leg, worse while sitting than standing.    Baclofen 120 mg daily is not helping.   She is also taking valium 10 mg -- this has helped anxiety more than spasticity. Her spasticity seemed to worsen a couple years ago.    She has dysesthesias, left > right.  Oxcarbaxepine and lamotrigine were poorly tolerated.  She has more fatigue and sleepiness.     She went to the ED 08/21/2018 due to shortness of breath and she  was felt to be having severe seasonal allergies.   Update 04/14/2018: During REMS testing her creatinine level was elevated at 1.41 (it was 0.91 1 month earlier).   Of note, she reports that her creatinine has fluctuated over the years and that there was one other time when she was felt to have a high reading that was felt to be due to dehydration.  She has noted decreased oral liquid intake at times due to dysphasia.   We discussed possibility of an autoimmune kidney disease as that can be seen at about 1: 400 Lemtrada patients  Spasticity is her main issue.    The left leg, especially, spasms up with movements.   Botox had not had much of a benefit on the left and maybe a 25% benefit in the milder right.    Spasms are very painful.    She also has spasticity in the left arm.    Pain feels like the arm is being held tightly by someone.  The left leg does worse with activity so she can't exercise with it.     She has done PT in the past but it has not helped much.    She is having more trouble with basic ADL.   She got the [facility] but it did not help that much.      She tires out easily with any activity.      She notes more trouble with swallowing liquids and is drinking less.     The problem is initiating the swallowing.     Hr sister notes that she seems to be having some hearing loss as she turns up TV louder and misses what people say at times.    Her anxiety is worse and she is back on Xanax.     More spasticity in the left arm, especially after squeezing, she can't easily straighten the fingers back up  Update 01/23/2018: Her main problem continues to be spasticity in the legs, left greater than right.  Associated with the spasticity she has quite a bit of pain.  She felt she received benefit from the Botox injections into the legs for a few weeks but then the spasticity returned to the same level.  We discussed using a larger dose today and will consider increasing the dose further based on  her response.  She is able to tolerate the baclofen better and has been able to increase the dose.  Gait is poor.  Ampyra seems to be helping a little bit.  She had 2 years of Lemtrada therapy, with the last dose about 15 months ago.  We are in the process of trying to get an additional year of [place] approved.    Update 10/16/2017: After the last visit, the baclofen dose was increased but she had trouble tolerating higher doses and did not think that her spasticity improved any.  She continues to report a lot of spasticity in both legs, left greater than right and in the left arm.  She denies spasticity in the right arm.  There continues to phasic spasms superimposed on the tonic spasms predominantly involving both feet and the left leg.  Gait is poor.  Around the home she will use a walker and can get from one room to another.   At the last visit I also started lamotrigine for her dysesthesias but it was poorly tolerated and she stopped.  Previously she had been unable to tolerate oxcarbazepine.  She has not yet restarted the Ampyra.  In the past, she had received Botox injections into muscles of the left lower leg and both feet.  This helped quite a bit with the first series but less so with the second series.  Since she has moved up to this area, she has not had any more Botox injections.  She had her first course of Lemtrada April 2017 and the second course of April 2018.  She has noted some further progression over the last year and we have discussed an additional third course.  She had shingles earlier this year.  Pain has resolved.  From 08/15/2017:  She was diagnosed in October 2013 due to difficulty with her handwriting and right hand coordination.   A few weeks later she had gait ataxia and numbness and was referred to MS.   She had an MRI of the brain (was in [place] at the time) and then had a LP confirming the diagnosis.  Initially, she was placed on Copaxone but had severe frequent  exacerbations and then went to Tecfidera due to breakthrough exacerbations.  She was referred to [facility] for Tysabri (Feb 2014 to November 2016).  She then moved to [facility] and saw Dr. Gardenia Phlegm [facility] [phone]).   She was always JCV Ab titer positive.    She did well on it but stopped 03/2015 due to safety risk.  She had more spasticity early 2017 that improved with 5 days IV Solumedrol and then started [facility] April 2017.   The infusion went well.    She started having more spasms in her legs later in 2017 and had Botox injections into her feet (some benefit but only for 3-4 weeks).   She had a second course of Botox November 2017 without much benefit.     She had numbness and tingling in her left arm November 2017 and was given an ESI.   Medical MJ did not help.   She had her second course of Lemtrada in April 2018 but the steroid did not help her spasticity as it did the first year.    She saw Dr. Pearletha Forge at [facility] a couple times in 2018 and also saw Dr. Higinio Plan at [place].  Currently,   She uses a walker (rarely cane) around her home and is wheeled outside the house.   Earlier this year, she had worsening spasticity with toes very spastic and legs locking up.    The severe leg spasms have improved but she still has spasms in her feet that are severe and  painful.   Spasticity is worse on the left (foot, leg and arm) and only in her foot on the right.    She takes 20 mg po qid.   Tizanidine did not help much.   She has some painful dysesthesia but gabapentin had not helped.   She continues to have a lot of pain, worse on her left.     Her left arm is also weak and she can't do many self care tasks (dressing, etc).    Due to the pain, she was drinking alcohol (2 - 3 drinks).   Clonazepam was tried but doses were low.   She was on Ampyra in the past 2014-2017 but felt it wasn't working and stopped.   She was on both Ampyra and Wellbutrin for at least one year and there were no  seizures.     She has urinary frequency and urgency, helped by Myrbetriq.   Sometimes due to her gait, she cannot get to the bathroom in time.    She had diplopia during 1 of her exacerbations a few years ago.  She continues to have some double vision though she is doing better now.    She never had optic neuritis.    Wellbutrin helped her mood better initially and she is very frustrated and irritable.   She was diagnosed with PBA in May 2018 and started on Nuedexta.   She did better mood-wise but spasticity was worse and she stopped.       She feels cognitive skills are mildly worse with mild short term memory issues and decreased focus and attention.     I have reviewed the MRI of the brain and cervical spine dated 03/19/2017.  The MRI of the brain shows multiple T2/FLAIR hyperintense foci in the cerebellum, pons, left greater than right thalamus and in the periventricular, juxtacortical and deep white matter of both hemispheres.    The MRI of the cervical spine showed a subtle focus Adjacent to C4-C5.  She has a disc protrusion at [facility].  REVIEW OF SYSTEMS: Constitutional: No fevers, chills, sweats, or change in appetite.  She reports fatigue. Eyes: As above Ear, nose and throat: No hearing loss, ear pain, nasal congestion, sore throat Cardiovascular: No chest pain, palpitations Respiratory: No shortness of breath at rest or with exertion.   No wheezes GastrointestinaI: No nausea, vomiting, diarrhea, abdominal pain, fecal incontinence Genitourinary:As above  musculoskeletal:She has pain in her legs, left greater than right due to spasticity.  She notes mild back pain. Integumentary: No rash, pruritus, skin lesions Neurological: as above Psychiatric: As above Endocrine: No palpitations, diaphoresis, change in appetite, change in weigh or increased thirst Hematologic/Lymphatic: No anemia, purpura, petechiae. Allergic/Immunologic: No itchy/runny eyes, nasal congestion, recent allergic  reactions, rashes  ALLERGIES: Allergies  Allergen Reactions  . Morphine Other (See Comments)    HOME MEDICATIONS:  Current Outpatient Medications:  .  acetaminophen (TYLENOL) 650 MG CR tablet, Take 1,300 mg by mouth every 8 (eight) hours as needed for pain. , Disp: , Rfl:  .  Acetaminophen-Codeine (TYLENOL WITH CODEINE #3 PO), Take by mouth., Disp: , Rfl:  .  albuterol (PROVENTIL) (2.5 MG/3ML) 0.083% nebulizer solution, Take 2.5 mg by nebulization every 6 (six) hours as needed for wheezing or shortness of breath., Disp: , Rfl:  .  albuterol (VENTOLIN HFA) 108 (90 Base) MCG/ACT inhaler, Inhale 1-2 puffs into the lungs every 6 (six) hours as needed for wheezing or shortness of breath. , Disp: , Rfl:  .  Alemtuzumab (LEMTRADA) 12 MG/1.2ML SOLN, Inject 12 mg into the vein See admin instructions. Once a year, Disp: , Rfl:  .  baclofen (LIORESAL) 20 MG tablet, 1 PO Q6 hours PRN for breakthrough spasms, Disp: 120 each, Rfl: 3 .  botulinum toxin Type A (BOTOX) 100 units SOLR injection, Inject 600 Units into the muscle every 3 (three) months., Disp: , Rfl:  .  buPROPion (WELLBUTRIN XL) 300 MG 24 hr tablet, Take 300 mg by mouth daily. , Disp: , Rfl:  .  Cholecalciferol (VITAMIN D-3) 5000 units TABS, Take 5,000 Units by mouth daily. , Disp: , Rfl:  .  Cyanocobalamin (VITAMIN B-12) 2500 MCG SUBL, Place 2,500 mcg under the tongue daily. , Disp: , Rfl:  .  cyclobenzaprine (FLEXERIL) 10 MG tablet, Take 1 tablet (10 mg total) by mouth 3 (three) times daily., Disp: 90 tablet, Rfl: 5 .  dalfampridine 10 MG TB12, One po bid (Patient taking differently: Take 10 mg by mouth 2 (two) times daily. One po bid), Disp: 60 tablet, Rfl: 11 .  diazepam (VALIUM) 5 MG tablet, Take up to 3 pills a day for spasticity (Patient taking differently: Take 5-10 mg by mouth See admin instructions. Take up to 3 pills a day for spasticity), Disp: 90 tablet, Rfl: 3 .  DULoxetine (CYMBALTA) 30 MG capsule, 1 tablet daily for 2 weeks and  then take 1 twice daily (Patient taking differently: Take 30 mg by mouth 2 (two) times daily. ), Disp: 60 capsule, Rfl: 3 .  ibuprofen (ADVIL,MOTRIN) 200 MG tablet, Take 400 mg by mouth every 8 (eight) hours as needed for moderate pain. , Disp: , Rfl:  .  montelukast (SINGULAIR) 10 MG tablet, Take 10 mg by mouth at bedtime., Disp: , Rfl:  .  ondansetron (ZOFRAN) 8 MG tablet, as needed With infusions (Patient taking differently: Take 8 mg by mouth every 8 (eight) hours as needed for nausea or vomiting. as needed With infusions), Disp: 20 tablet, Rfl: 1 .  oxybutynin (DITROPAN-XL) 10 MG 24 hr tablet, Take 10 mg by mouth daily., Disp: , Rfl:  .  oxyCODONE-acetaminophen (PERCOCET) 10-325 MG tablet, Take 1 tablet by mouth 2 (two) times daily as needed for pain., Disp: 60 tablet, Rfl: 0 .  ranitidine (ZANTAC) 300 MG tablet, Take 300 mg by mouth daily as needed for heartburn., Disp: , Rfl:   PAST MEDICAL HISTORY: Past Medical History:  Diagnosis Date  . Allergies   . Anxiety   . Arthritis   . Asthma   . Constipation   . Depression   . GERD (gastroesophageal reflux disease)   . Headache   . History of hiatal hernia   . Multiple sclerosis (HCC)   . Vision abnormalities   . Wears glasses     PAST SURGICAL HISTORY:   FAMILY HISTORY: Family History  Problem Relation Age of Onset  . Diabetes Mother   . Healthy Brother     SOCIAL HISTORY:  Social History   Socioeconomic History  . Marital status: Married    Spouse name: Not on file  . Number of children: Not on file  . Years of education: Not on file  . Highest education level: Not on file  Occupational History  . Not on file  Social Needs  . Financial resource strain: Not on file  . Food insecurity    Worry: Not on file    Inability: Not on file  . Transportation needs    Medical: Not on file  Non-medical: Not on file  Tobacco Use  . Smoking status: Current Every Day Smoker    Packs/day: 0.50    Types: Cigarettes  .  Smokeless tobacco: Never Used  Substance and Sexual Activity  . Alcohol use: Yes    Comment: 2-3 times per wk  . Drug use: Never  . Sexual activity: Not on file  Lifestyle  . Physical activity    Days per week: Not on file    Minutes per session: Not on file  . Stress: Not on file  Relationships  . Social Musicianconnections    Talks on phone: Not on file    Gets together: Not on file    Attends religious service: Not on file    Active member of club or organization: Not on file    Attends meetings of clubs or organizations: Not on file    Relationship status: Not on file  . Intimate partner violence    Fear of current or ex partner: Not on file    Emotionally abused: Not on file    Physically abused: Not on file    Forced sexual activity: Not on file  Other Topics Concern  . Not on file  Social History Narrative   Right handed    Caffeine: 1 cup or less per day- coffee   [employer]     PHYSICAL EXAM  Vitals:   03/19/19 1325  BP: 118/80  Pulse: 80  Temp: 98 F (36.7 C)  SpO2: 95%  Height: 5\' 9"  (1.753 m)    Body mass index is 30.27 kg/m.   General: The patient is well-developed and well-nourished and in no acute distress  Neurologic Exam  Mental status: The patient is alert and oriented x 3 at the time of the examination. The patient has apparent normal recent and remote memory, with an apparently normal attention span and concentration ability.   Speech is normal.  Cranial nerves: Bilateral INO's, right greater than left.  Facial strength and sensation is normal.  The tongue is midline, and the patient has symmetric elevation of the soft palate. No obvious hearing deficits are noted.  Motor:   Making a fist triggered flexion spasms in the left hand/fingers.  After standing for about 20 to 30 seconds, she had flexion spasms of the left foot.  Muscle bulk is normal.  There is increased muscle tone in the legs, left greater than right.  She has moderate increased muscle  tone in the left arm.  The right arm has normal muscle tone.  Strength is 4to 4+/5 in the left arm, 3-4-/5 in the left leg and 4- to 4/5 in the right leg .     Sensory: Normal sensation to touch and vibration in the arms.  She has reduced sensation to vibration in the left leg.  Coordination: She has fairly normal right finger-nose-finger but reduced left finger-nose-finger.  Heel-to-shin is performed very poorly on the right and she cannot do on the left.     Gait and station: She needs support to stand up.  Her gait is spastic and needs bilateral support.  She cannot do a tandem walk.  Romberg is positive.  Reflexes: Deep tendon reflexes are now 1+ at the knees and there is no clonus at the ankles which is improved.       ASSESSMENT AND PLAN     1. Multiple sclerosis (HCC)   2. Spastic diplegia, acquired, lower extremity (HCC)   3. High risk medication use  4. Muscle spasticity   5. Gait disturbance   6. Presence of intrathecal baclofen pump      1.  She has had 3 courses of Lemtrada for her MS.    She needs to continue the REMS monitoring until late 2023.  Her more recent symptoms are more likely part of her progressive MS and less likely to be a new exacerbation.  I will review the MRI results after they are performed. 2.  She is having her baclofen dose titrated up.  I discussed that many people need dosages above 500 mcg a day and hopefully she will notice more benefit as the dose is increased further.  I also discussed with her that if the baclofen is not helping enough on his own or if she becomes too weak and the dose needs to be reduced, we could consider having clonidine added to the baclofen.. 3.   I will write for oxycodone 10 mg once or twice a day.. 4.   Continue Ampyra for gait.   Follow-up in 4 months or sooner if there are new or worsening neurologic symptoms.  40-minute face-to-face evaluation with greater than one half of the time counseling and coordinating care  related to her new symptoms and spasticity from the MS.  Irais Mottram A. Epimenio Foot, MD, [facility] 03/19/2019, 6:19 PM Certified in Neurology, Clinical Neurophysiology, Sleep Medicine, Pain Medicine and Neuroimaging  [facility] [address] [place], [place] [address] [phone]

## 2019-03-20 ENCOUNTER — Ambulatory Visit: Payer: BLUE CROSS/BLUE SHIELD | Admitting: Neurology

## 2019-03-20 DIAGNOSIS — F17210 Nicotine dependence, cigarettes, uncomplicated: Secondary | ICD-10-CM | POA: Diagnosis not present

## 2019-03-20 DIAGNOSIS — G35 Multiple sclerosis: Secondary | ICD-10-CM | POA: Diagnosis not present

## 2019-03-20 DIAGNOSIS — F418 Other specified anxiety disorders: Secondary | ICD-10-CM | POA: Diagnosis not present

## 2019-03-20 DIAGNOSIS — H532 Diplopia: Secondary | ICD-10-CM | POA: Diagnosis not present

## 2019-03-20 DIAGNOSIS — G8220 Paraplegia, unspecified: Secondary | ICD-10-CM | POA: Diagnosis not present

## 2019-03-23 DIAGNOSIS — G35 Multiple sclerosis: Secondary | ICD-10-CM | POA: Diagnosis not present

## 2019-03-23 DIAGNOSIS — F418 Other specified anxiety disorders: Secondary | ICD-10-CM | POA: Diagnosis not present

## 2019-03-23 DIAGNOSIS — F17210 Nicotine dependence, cigarettes, uncomplicated: Secondary | ICD-10-CM | POA: Diagnosis not present

## 2019-03-23 DIAGNOSIS — G8220 Paraplegia, unspecified: Secondary | ICD-10-CM | POA: Diagnosis not present

## 2019-03-23 DIAGNOSIS — H532 Diplopia: Secondary | ICD-10-CM | POA: Diagnosis not present

## 2019-03-25 ENCOUNTER — Other Ambulatory Visit: Payer: PRIVATE HEALTH INSURANCE

## 2019-03-25 ENCOUNTER — Other Ambulatory Visit: Payer: Self-pay

## 2019-03-26 ENCOUNTER — Encounter: Payer: Self-pay | Admitting: *Deleted

## 2019-03-26 DIAGNOSIS — G35 Multiple sclerosis: Secondary | ICD-10-CM | POA: Diagnosis not present

## 2019-03-26 DIAGNOSIS — M62838 Other muscle spasm: Secondary | ICD-10-CM | POA: Diagnosis not present

## 2019-03-26 DIAGNOSIS — Z9689 Presence of other specified functional implants: Secondary | ICD-10-CM | POA: Diagnosis not present

## 2019-03-26 DIAGNOSIS — R030 Elevated blood-pressure reading, without diagnosis of hypertension: Secondary | ICD-10-CM | POA: Diagnosis not present

## 2019-03-27 DIAGNOSIS — G35 Multiple sclerosis: Secondary | ICD-10-CM | POA: Diagnosis not present

## 2019-03-27 DIAGNOSIS — H532 Diplopia: Secondary | ICD-10-CM | POA: Diagnosis not present

## 2019-03-27 DIAGNOSIS — F418 Other specified anxiety disorders: Secondary | ICD-10-CM | POA: Diagnosis not present

## 2019-03-27 DIAGNOSIS — G8220 Paraplegia, unspecified: Secondary | ICD-10-CM | POA: Diagnosis not present

## 2019-03-27 DIAGNOSIS — F17210 Nicotine dependence, cigarettes, uncomplicated: Secondary | ICD-10-CM | POA: Diagnosis not present

## 2019-03-28 ENCOUNTER — Telehealth: Payer: Self-pay | Admitting: Neurology

## 2019-03-30 ENCOUNTER — Emergency Department (HOSPITAL_COMMUNITY): Payer: BC Managed Care – PPO

## 2019-03-30 ENCOUNTER — Inpatient Hospital Stay (HOSPITAL_COMMUNITY)
Admission: EM | Admit: 2019-03-30 | Discharge: 2019-04-03 | DRG: 060 | Disposition: A | Discharge status: Rehabilitation Facility | Payer: BC Managed Care – PPO | Attending: Internal Medicine | Admitting: Internal Medicine

## 2019-03-30 ENCOUNTER — Other Ambulatory Visit: Payer: Self-pay | Admitting: *Deleted

## 2019-03-30 ENCOUNTER — Encounter (HOSPITAL_COMMUNITY): Payer: Self-pay | Admitting: Emergency Medicine

## 2019-03-30 ENCOUNTER — Other Ambulatory Visit: Payer: Self-pay

## 2019-03-30 DIAGNOSIS — G35 Multiple sclerosis: Secondary | ICD-10-CM | POA: Diagnosis not present

## 2019-03-30 DIAGNOSIS — Z978 Presence of other specified devices: Secondary | ICD-10-CM | POA: Diagnosis not present

## 2019-03-30 DIAGNOSIS — R1310 Dysphagia, unspecified: Secondary | ICD-10-CM | POA: Diagnosis not present

## 2019-03-30 DIAGNOSIS — R531 Weakness: Secondary | ICD-10-CM | POA: Diagnosis not present

## 2019-03-30 DIAGNOSIS — Z833 Family history of diabetes mellitus: Secondary | ICD-10-CM | POA: Diagnosis not present

## 2019-03-30 DIAGNOSIS — Z9689 Presence of other specified functional implants: Secondary | ICD-10-CM | POA: Diagnosis present

## 2019-03-30 DIAGNOSIS — H538 Other visual disturbances: Secondary | ICD-10-CM | POA: Diagnosis not present

## 2019-03-30 DIAGNOSIS — Z20828 Contact with and (suspected) exposure to other viral communicable diseases: Secondary | ICD-10-CM | POA: Diagnosis not present

## 2019-03-30 DIAGNOSIS — Z79899 Other long term (current) drug therapy: Secondary | ICD-10-CM | POA: Diagnosis not present

## 2019-03-30 DIAGNOSIS — D72829 Elevated white blood cell count, unspecified: Secondary | ICD-10-CM | POA: Diagnosis not present

## 2019-03-30 DIAGNOSIS — F419 Anxiety disorder, unspecified: Secondary | ICD-10-CM | POA: Diagnosis not present

## 2019-03-30 DIAGNOSIS — K219 Gastro-esophageal reflux disease without esophagitis: Secondary | ICD-10-CM | POA: Diagnosis not present

## 2019-03-30 DIAGNOSIS — J45909 Unspecified asthma, uncomplicated: Secondary | ICD-10-CM | POA: Diagnosis present

## 2019-03-30 DIAGNOSIS — Z885 Allergy status to narcotic agent status: Secondary | ICD-10-CM

## 2019-03-30 DIAGNOSIS — E46 Unspecified protein-calorie malnutrition: Secondary | ICD-10-CM | POA: Diagnosis present

## 2019-03-30 DIAGNOSIS — G8220 Paraplegia, unspecified: Secondary | ICD-10-CM | POA: Diagnosis present

## 2019-03-30 DIAGNOSIS — G9341 Metabolic encephalopathy: Secondary | ICD-10-CM | POA: Diagnosis present

## 2019-03-30 DIAGNOSIS — Z6830 Body mass index (BMI) 30.0-30.9, adult: Secondary | ICD-10-CM | POA: Diagnosis not present

## 2019-03-30 DIAGNOSIS — F418 Other specified anxiety disorders: Secondary | ICD-10-CM | POA: Diagnosis present

## 2019-03-30 DIAGNOSIS — T380X5A Adverse effect of glucocorticoids and synthetic analogues, initial encounter: Secondary | ICD-10-CM | POA: Diagnosis not present

## 2019-03-30 DIAGNOSIS — Z79891 Long term (current) use of opiate analgesic: Secondary | ICD-10-CM

## 2019-03-30 DIAGNOSIS — K5900 Constipation, unspecified: Secondary | ICD-10-CM | POA: Diagnosis not present

## 2019-03-30 DIAGNOSIS — F17210 Nicotine dependence, cigarettes, uncomplicated: Secondary | ICD-10-CM | POA: Diagnosis present

## 2019-03-30 DIAGNOSIS — K592 Neurogenic bowel, not elsewhere classified: Secondary | ICD-10-CM | POA: Diagnosis present

## 2019-03-30 DIAGNOSIS — E8809 Other disorders of plasma-protein metabolism, not elsewhere classified: Secondary | ICD-10-CM | POA: Diagnosis present

## 2019-03-30 DIAGNOSIS — M62838 Other muscle spasm: Secondary | ICD-10-CM | POA: Diagnosis not present

## 2019-03-30 DIAGNOSIS — R5383 Other fatigue: Secondary | ICD-10-CM | POA: Diagnosis not present

## 2019-03-30 DIAGNOSIS — R739 Hyperglycemia, unspecified: Secondary | ICD-10-CM | POA: Diagnosis not present

## 2019-03-30 DIAGNOSIS — Z791 Long term (current) use of non-steroidal anti-inflammatories (NSAID): Secondary | ICD-10-CM | POA: Diagnosis not present

## 2019-03-30 DIAGNOSIS — R627 Adult failure to thrive: Secondary | ICD-10-CM | POA: Diagnosis present

## 2019-03-30 DIAGNOSIS — Z9079 Acquired absence of other genital organ(s): Secondary | ICD-10-CM | POA: Diagnosis not present

## 2019-03-30 DIAGNOSIS — R269 Unspecified abnormalities of gait and mobility: Secondary | ICD-10-CM | POA: Diagnosis not present

## 2019-03-30 DIAGNOSIS — Y92239 Unspecified place in hospital as the place of occurrence of the external cause: Secondary | ICD-10-CM | POA: Diagnosis not present

## 2019-03-30 DIAGNOSIS — M6281 Muscle weakness (generalized): Secondary | ICD-10-CM | POA: Diagnosis not present

## 2019-03-30 DIAGNOSIS — F329 Major depressive disorder, single episode, unspecified: Secondary | ICD-10-CM | POA: Diagnosis present

## 2019-03-30 LAB — CBC
HCT: 48.9 % — ABNORMAL HIGH (ref 36.0–46.0)
Hemoglobin: 15.5 g/dL — ABNORMAL HIGH (ref 12.0–15.0)
MCH: 32.5 pg (ref 26.0–34.0)
MCHC: 31.7 g/dL (ref 30.0–36.0)
MCV: 102.5 fL — ABNORMAL HIGH (ref 80.0–100.0)
Platelets: 271 10*3/uL (ref 150–400)
RBC: 4.77 MIL/uL (ref 3.87–5.11)
RDW: 12.5 % (ref 11.5–15.5)
WBC: 11 10*3/uL — ABNORMAL HIGH (ref 4.0–10.5)
nRBC: 0 % (ref 0.0–0.2)

## 2019-03-30 LAB — BASIC METABOLIC PANEL
Anion gap: 11 (ref 5–15)
BUN: 13 mg/dL (ref 6–20)
CO2: 24 mmol/L (ref 22–32)
Calcium: 9.6 mg/dL (ref 8.9–10.3)
Chloride: 101 mmol/L (ref 98–111)
Creatinine, Ser: 0.86 mg/dL (ref 0.44–1.00)
GFR calc Af Amer: >60 mL/min (ref >60)
GFR calc non Af Amer: >60 mL/min (ref >60)
Glucose, Bld: 99 mg/dL (ref 70–99)
Potassium: 4 mmol/L (ref 3.5–5.1)
Sodium: 136 mmol/L (ref 135–145)

## 2019-03-30 LAB — I-STAT BETA HCG BLOOD, ED (MC, WL, AP ONLY): I-stat hCG, quantitative: <5 m[IU]/mL (ref <5)

## 2019-03-30 MED ORDER — HYDROMORPHONE HCL 1 MG/ML IJ SOLN
1.0000 mg | Freq: Once | INTRAMUSCULAR | Status: AC
  Administered 2019-03-30: 21:00:00 1 mg via INTRAVENOUS
  Filled 2019-03-30: qty 1

## 2019-03-30 MED ORDER — DIAZEPAM 5 MG/ML IJ SOLN
5.0000 mg | Freq: Once | INTRAMUSCULAR | Status: AC
  Administered 2019-03-30: 21:00:00 5 mg via INTRAVENOUS
  Filled 2019-03-30: qty 2

## 2019-03-30 MED ORDER — DULOXETINE HCL 30 MG PO CPEP: 30.0000 mg | ORAL_CAPSULE | Freq: Two times a day (BID) | ORAL | 3 refills | Status: DC

## 2019-03-30 MED ORDER — BACLOFEN 20 MG PO TABS
20.0000 mg | ORAL_TABLET | Freq: Three times a day (TID) | ORAL | Status: DC
  Administered 2019-03-30 – 2019-04-03 (×12): 20 mg via ORAL
  Filled 2019-03-30 (×7): qty 1
  Filled 2019-03-30: qty 4
  Filled 2019-03-30 (×6): qty 1

## 2019-03-30 MED ORDER — SODIUM CHLORIDE 0.9% FLUSH: 3.0000 mL | Freq: Once | INTRAVENOUS | Status: DC

## 2019-03-30 NOTE — ED Triage Notes (Addendum)
Pt states she has MS and feels like she is having a flare up. She called her MD and was told to come here for MRI and solu medrol. Pt states her eyes feel jittery and arms as well. Left hand is always weak but feels worse. Pt voice hoarse. Pt feels lethargic. Pt states she went to the eye MD today and was told her vision Is fine.

## 2019-03-30 NOTE — ED Provider Notes (Signed)
[facility] Provider Note   CSN: [id number] Arrival date & time: 03/30/19  1741     History   Chief Complaint Chief Complaint  Patient presents with  . Weakness    HPI Sharon Odom is a 48 y.o. female.     Patient is a 48 year old female with past medical history of multiple sclerosis, anxiety, arthritis, asthma, GERD presenting to the emergency department for increasing leg weakness, spasms, blurry vision.  Patient reports that over the past 5 weeks she has felt like her multiple sclerosis symptoms are worsening.  At baseline she has some spasms and uses a walker and had a baclofen pump placed 5 weeks ago.  Reports that each week they are increasing the dose of baclofen but despite that her symptoms are actually worsening to the point where she is struggling to walk altogether today.  Reports worsening cramping and spasm in the left hand as well as difficulty swallowing and some blurry vision.  Saw ophthalmologist today who saw no acute abnormalities.  No history of signs of optic neuritis in the past.  Reports that she saw Dr. Linden DolinStater her neurologist recently who ordered some MRIs which have not been completed yet.  Denies any fever     Past Medical History:  Diagnosis Date  . Allergies   . Anxiety   . Arthritis   . Asthma   . Constipation   . Depression   . GERD (gastroesophageal reflux disease)   . Headache   . History of hiatal hernia   . Multiple sclerosis (HCC)   . Vision abnormalities   . Wears glasses     Patient Active Problem List   Diagnosis Date Noted  . Presence of intrathecal baclofen pump 03/19/2019  . Dysphagia 04/14/2018  . Spastic diplegia, acquired, lower extremity (HCC) 10/16/2017  . Muscle spasticity 08/15/2017  . Gait disturbance 08/15/2017  . Depression with anxiety 08/15/2017  . Diplopia 08/15/2017  . Multiple sclerosis (HCC) 03/04/2017  . High risk medication use 02/06/2017    Past Surgical History:   Procedure Laterality Date  . EXCISION MORTON'S NEUROMA Right   . fallopian tube removal    . INTRATHECAL PUMP IMPLANT Right 02/18/2019   Procedure: INTRATHECAL PUMP IMPLANT;  Surgeon: Odette FractionHarkins, Paul, MD;  [facility];  Service: Neurosurgery;  Laterality: Right;  INTRATHECAL PUMP IMPLANT  . WISDOM TOOTH EXTRACTION       OB History   No obstetric history on file.      Home Medications    Prior to Admission medications   Medication Sig Start Date End Date Taking? Authorizing Provider  acetaminophen (TYLENOL) 650 MG CR tablet Take 1,300 mg by mouth every 8 (eight) hours as needed for pain.     [provider]  Acetaminophen-Codeine (TYLENOL WITH CODEINE #3 PO) Take by mouth.    [provider]  albuterol (PROVENTIL) (2.5 MG/3ML) 0.083% nebulizer solution Take 2.5 mg by nebulization every 6 (six) hours as needed for wheezing or shortness of breath.    [provider]  albuterol (VENTOLIN HFA) 108 (90 Base) MCG/ACT inhaler Inhale 1-2 puffs into the lungs every 6 (six) hours as needed for wheezing or shortness of breath.     [provider]  Alemtuzumab (LEMTRADA) 12 MG/1.2ML SOLN Inject 12 mg into the vein See admin instructions. Once a year    [provider]  baclofen (LIORESAL) 20 MG tablet 1 PO Q6 hours PRN for breakthrough spasms 02/18/19   Odette FractionHarkins, Paul,  MD  botulinum toxin Type A (BOTOX) 100 units SOLR injection Inject 600 Units into the muscle every 3 (three) months.    [provider]  buPROPion (WELLBUTRIN XL) 300 MG 24 hr tablet Take 300 mg by mouth daily.     [provider]  Cholecalciferol (VITAMIN D-3) 5000 units TABS Take 5,000 Units by mouth daily.     [provider]  Cyanocobalamin (VITAMIN B-12) 2500 MCG SUBL Place 2,500 mcg under the tongue daily.     [provider]  cyclobenzaprine (FLEXERIL) 10 MG tablet Take 1 tablet (10 mg total) by mouth 3 (three) times daily. 10/23/18   Sater, [name], MD  dalfampridine 10 MG TB12 One po bid Patient taking differently: Take 10 mg by mouth 2 (two) times daily. One po bid 09/18/18   Sater, [name], MD  diazepam (VALIUM) 5 MG tablet Take up to 3 pills a day for spasticity Patient taking differently: Take 5-10 mg by mouth See admin instructions. Take up to 3 pills a day for spasticity 12/15/18   Sater, [name], MD  DULoxetine (CYMBALTA) 30 MG capsule Take 1 capsule (30 mg total) by mouth 2 (two) times daily. 03/30/19   Sater, [name], MD  ibuprofen (ADVIL,MOTRIN) 200 MG tablet Take 400 mg by mouth every 8 (eight) hours as needed for moderate pain.     [provider]  montelukast (SINGULAIR) 10 MG tablet Take 10 mg by mouth at bedtime.    [provider]  ondansetron (ZOFRAN) 8 MG tablet as needed With infusions Patient taking differently: Take 8 mg by mouth every 8 (eight) hours as needed for nausea or vomiting. as needed With infusions 02/24/18   Sater, [name], MD  oxybutynin (DITROPAN-XL) 10 MG 24 hr tablet Take 10 mg by mouth daily. 02/11/19   [provider]  oxyCODONE-acetaminophen (PERCOCET) 10-325 MG tablet Take 1 tablet by mouth 2 (two) times daily as needed for pain. 03/19/19   Sater, [name], MD  ranitidine (ZANTAC) 300 MG tablet Take 300 mg by mouth daily as needed for heartburn.    [provider]    Family History Family History  Problem Relation Age of Onset  . Diabetes Mother   . Healthy Brother     Social History Social History   Tobacco Use  . Smoking status: Current Every Day Smoker    Packs/day: 0.50    Types: Cigarettes  . Smokeless tobacco: Never Used  Substance Use Topics  . Alcohol use: Yes    Comment: 2-3 times per wk  . Drug use: Never     Allergies   Morphine   Review of Systems Review of Systems  Constitutional: Positive for fatigue. Negative for appetite change and fever.  HENT: Positive for trouble swallowing. Negative for congestion and mouth sores.    Eyes: Positive for visual disturbance.  Respiratory: Negative for cough and shortness of breath.   Gastrointestinal: Negative for abdominal pain, diarrhea, nausea and vomiting.  Genitourinary: Negative for dysuria.  Musculoskeletal: Positive for back pain and myalgias.  Skin: Negative for rash and wound.  Neurological: Positive for tremors and weakness. Negative for dizziness, seizures, facial asymmetry, speech difficulty, light-headedness and numbness.     Physical Exam Updated Vital Signs BP 113/79   Pulse 78   Temp 98.4 F (36.9 C) (Oral)   Resp 15   SpO2 97%   Physical Exam Vitals signs and nursing note reviewed.  Constitutional:      Appearance:  Normal appearance.  HENT:     Head: Normocephalic.     Mouth/Throat:     Mouth: Mucous membranes are moist.     Comments: Increased effort to speak, airway patent Eyes:     Conjunctiva/sclera: Conjunctivae normal.  Cardiovascular:     Rate and Rhythm: Normal rate and regular rhythm.  Pulmonary:     Effort: Pulmonary effort is normal.  Musculoskeletal:     Comments: Weakness in extremities  Skin:    General: Skin is warm and dry.     Capillary Refill: Capillary refill takes less than 2 seconds.     Findings: No bruising or erythema.     Comments: Baclofen pump incision sites appear normal without signs of infection   Neurological:     Mental Status: She is alert and oriented to person, place, and time.     Cranial Nerves: No cranial nerve deficit.  Psychiatric:        Mood and Affect: Mood normal.      ED Treatments / Results  Labs (all labs ordered are listed, but only abnormal results are displayed) Labs Reviewed  CBC - Abnormal; Notable for the following components:      Result Value   WBC 11.0 (*)    Hemoglobin 15.5 (*)    HCT 48.9 (*)    MCV 102.5 (*)    All other components within normal limits  SARS CORONAVIRUS 2 (TAT 6-24 HRS)  BASIC METABOLIC PANEL  URINALYSIS, ROUTINE W REFLEX MICROSCOPIC  CBG  MONITORING, ED  I-STAT BETA HCG BLOOD, ED ([facility], [facility], [facility] ONLY)    EKG EKG Interpretation  Date/Time:  Monday March 30 2019 19:13:56 EST Ventricular Rate:  99 PR Interval:    QRS Duration: 96 QT Interval:  332 QTC Calculation: 426 R Axis:   1 Text Interpretation: Sinus rhythm Inferior infarct, old No previous ECGs available Confirmed by Wandra Arthurs [phone]) on 03/30/2019 7:29:44 PM   Radiology No results found.  Procedures Procedures (including critical care time)  Medications Ordered in ED Medications  sodium chloride flush (NS) 0.9 % injection 3 mL (0 mLs Intravenous Hold 03/30/19 2233)  baclofen (LIORESAL) tablet 20 mg (20 mg Oral Given 03/30/19 2223)  diazepam (VALIUM) injection 5 mg (5 mg Intravenous Given 03/30/19 2104)  HYDROmorphone (DILAUDID) injection 1 mg (1 mg Intravenous Given 03/30/19 2103)     Initial Impression / Assessment and Plan / ED Course  I have reviewed the triage vital signs and the nursing notes.  Pertinent labs & imaging results that were available during my care of the patient were reviewed by me and considered in my medical decision making (see chart for details).  Clinical Course as of Mar 29 2341  Mon Mar 30, 2019  2042 Discussed patient with Dr. Cheral Marker. MRI brain, C and T spine ordered per request. Dr. Cheral Marker to come and see patient for acute MS flare. Likely to be admitted to hospitalist for IV steroids   [KM]  2204 Patient in MRI. Patient has MRI safe intrathecal pump which should automatically turn on and off during MRI. I Spoke with Dr. Darl Householder about this and we gave patient 20mg  oral baclofen due to her pump being shut off for the next 2-3 hours for MRI. I also called [employer] and arranged for device rep to f/u with patient after MRI tonight to be sure the device is interrogated and turned back on. Dr. Darl Householder in agreement with plan   [KM]  2213 Spoke with [employer]  rep Clelia Schaumann who confirmed that the pump should automatically turn back on  after MRI. She stated it will Beep several time and then stop. She stated she will come by early morning 03/31/2019 to interrogate the pump.   [KM]  2341 Waiting on MRI results and Neuro consult. Care signed out to Roxy Horseman PA due to change of shift   [KM]    Clinical Course User Index [KM] Arlyn Dunning, PA-C         Final Clinical Impressions(s) / ED Diagnoses   Final diagnoses:  Multiple sclerosis [facility])    ED Discharge Orders    None       Jeral Pinch 03/30/19 2342    Charlynne Pander, MD 03/31/19 1325

## 2019-03-30 NOTE — ED Notes (Signed)
Pt still at MRI

## 2019-03-30 NOTE — Telephone Encounter (Signed)
E-scribed refills to pharmacy as requested.  

## 2019-03-30 NOTE — ED Notes (Signed)
Patient transported to MRI 

## 2019-03-30 NOTE — Progress Notes (Signed)
Pt has intrathecal baclofen pump. Per [employer], pump will stop working during MRI scan. Spoke to Dr. Aundra Dubin who verified it is ok for pt to be without drug infusion for duration of exam.

## 2019-03-31 DIAGNOSIS — R531 Weakness: Secondary | ICD-10-CM | POA: Diagnosis not present

## 2019-03-31 DIAGNOSIS — Z6830 Body mass index (BMI) 30.0-30.9, adult: Secondary | ICD-10-CM | POA: Diagnosis not present

## 2019-03-31 DIAGNOSIS — Z978 Presence of other specified devices: Secondary | ICD-10-CM | POA: Diagnosis not present

## 2019-03-31 DIAGNOSIS — F419 Anxiety disorder, unspecified: Secondary | ICD-10-CM | POA: Diagnosis present

## 2019-03-31 DIAGNOSIS — K219 Gastro-esophageal reflux disease without esophagitis: Secondary | ICD-10-CM | POA: Diagnosis present

## 2019-03-31 DIAGNOSIS — F329 Major depressive disorder, single episode, unspecified: Secondary | ICD-10-CM | POA: Diagnosis present

## 2019-03-31 DIAGNOSIS — Z9079 Acquired absence of other genital organ(s): Secondary | ICD-10-CM | POA: Diagnosis not present

## 2019-03-31 DIAGNOSIS — Z791 Long term (current) use of non-steroidal anti-inflammatories (NSAID): Secondary | ICD-10-CM | POA: Diagnosis not present

## 2019-03-31 DIAGNOSIS — K592 Neurogenic bowel, not elsewhere classified: Secondary | ICD-10-CM | POA: Diagnosis present

## 2019-03-31 DIAGNOSIS — Z9689 Presence of other specified functional implants: Secondary | ICD-10-CM | POA: Diagnosis present

## 2019-03-31 DIAGNOSIS — Z79891 Long term (current) use of opiate analgesic: Secondary | ICD-10-CM | POA: Diagnosis not present

## 2019-03-31 DIAGNOSIS — R1310 Dysphagia, unspecified: Secondary | ICD-10-CM | POA: Diagnosis present

## 2019-03-31 DIAGNOSIS — F418 Other specified anxiety disorders: Secondary | ICD-10-CM | POA: Diagnosis present

## 2019-03-31 DIAGNOSIS — Z20828 Contact with and (suspected) exposure to other viral communicable diseases: Secondary | ICD-10-CM | POA: Diagnosis present

## 2019-03-31 DIAGNOSIS — Z79899 Other long term (current) drug therapy: Secondary | ICD-10-CM | POA: Diagnosis not present

## 2019-03-31 DIAGNOSIS — Z885 Allergy status to narcotic agent status: Secondary | ICD-10-CM | POA: Diagnosis not present

## 2019-03-31 DIAGNOSIS — K5900 Constipation, unspecified: Secondary | ICD-10-CM | POA: Diagnosis present

## 2019-03-31 DIAGNOSIS — Z833 Family history of diabetes mellitus: Secondary | ICD-10-CM | POA: Diagnosis not present

## 2019-03-31 DIAGNOSIS — D72829 Elevated white blood cell count, unspecified: Secondary | ICD-10-CM | POA: Diagnosis not present

## 2019-03-31 DIAGNOSIS — G8220 Paraplegia, unspecified: Secondary | ICD-10-CM | POA: Diagnosis not present

## 2019-03-31 DIAGNOSIS — E8809 Other disorders of plasma-protein metabolism, not elsewhere classified: Secondary | ICD-10-CM | POA: Diagnosis present

## 2019-03-31 DIAGNOSIS — F17210 Nicotine dependence, cigarettes, uncomplicated: Secondary | ICD-10-CM | POA: Diagnosis present

## 2019-03-31 DIAGNOSIS — Y92239 Unspecified place in hospital as the place of occurrence of the external cause: Secondary | ICD-10-CM | POA: Diagnosis not present

## 2019-03-31 DIAGNOSIS — T380X5A Adverse effect of glucocorticoids and synthetic analogues, initial encounter: Secondary | ICD-10-CM | POA: Diagnosis present

## 2019-03-31 DIAGNOSIS — R739 Hyperglycemia, unspecified: Secondary | ICD-10-CM | POA: Diagnosis not present

## 2019-03-31 DIAGNOSIS — E46 Unspecified protein-calorie malnutrition: Secondary | ICD-10-CM | POA: Diagnosis present

## 2019-03-31 DIAGNOSIS — M62838 Other muscle spasm: Secondary | ICD-10-CM | POA: Diagnosis present

## 2019-03-31 DIAGNOSIS — R269 Unspecified abnormalities of gait and mobility: Secondary | ICD-10-CM | POA: Diagnosis not present

## 2019-03-31 DIAGNOSIS — J45909 Unspecified asthma, uncomplicated: Secondary | ICD-10-CM | POA: Diagnosis present

## 2019-03-31 DIAGNOSIS — G35 Multiple sclerosis: Secondary | ICD-10-CM | POA: Diagnosis present

## 2019-03-31 LAB — URINALYSIS, ROUTINE W REFLEX MICROSCOPIC
Bilirubin Urine: NEGATIVE
Glucose, UA: NEGATIVE mg/dL
Hgb urine dipstick: NEGATIVE
Ketones, ur: 20 mg/dL — AB
Leukocytes,Ua: NEGATIVE
Nitrite: NEGATIVE
Protein, ur: NEGATIVE mg/dL
Specific Gravity, Urine: 1.026 (ref 1.005–1.030)
pH: 5 (ref 5.0–8.0)

## 2019-03-31 LAB — MAGNESIUM: Magnesium: 2.2 mg/dL (ref 1.7–2.4)

## 2019-03-31 LAB — HIV ANTIBODY (ROUTINE TESTING W REFLEX): HIV Screen 4th Generation wRfx: NONREACTIVE

## 2019-03-31 LAB — PHOSPHORUS: Phosphorus: 4.1 mg/dL (ref 2.5–4.6)

## 2019-03-31 LAB — SARS CORONAVIRUS 2 (TAT 6-24 HRS): SARS Coronavirus 2: NEGATIVE

## 2019-03-31 LAB — TSH: TSH: 1.184 u[IU]/mL (ref 0.350–4.500)

## 2019-03-31 MED ORDER — POLYETHYLENE GLYCOL 3350 17 G PO PACK
17.0000 g | PACK | Freq: Every day | ORAL | Status: DC
  Administered 2019-04-01 – 2019-04-03 (×3): 17 g via ORAL
  Filled 2019-03-31 (×4): qty 1

## 2019-03-31 MED ORDER — ENOXAPARIN SODIUM 40 MG/0.4ML ~~LOC~~ SOLN
40.0000 mg | SUBCUTANEOUS | Status: DC
  Administered 2019-03-31 – 2019-04-03 (×4): 40 mg via SUBCUTANEOUS
  Filled 2019-03-31 (×4): qty 0.4

## 2019-03-31 MED ORDER — DIAZEPAM 5 MG PO TABS: 5.0000 mg | ORAL_TABLET | Freq: Three times a day (TID) | ORAL | Status: DC | PRN

## 2019-03-31 MED ORDER — OXYBUTYNIN CHLORIDE ER 10 MG PO TB24
10.0000 mg | ORAL_TABLET | Freq: Every day | ORAL | Status: DC
  Administered 2019-04-01 – 2019-04-03 (×3): 10 mg via ORAL
  Filled 2019-03-31 (×5): qty 1

## 2019-03-31 MED ORDER — VITAMIN B-12 2500 MCG SL SUBL: 2500.0000 ug | SUBLINGUAL_TABLET | Freq: Every day | SUBLINGUAL | Status: DC

## 2019-03-31 MED ORDER — FAMOTIDINE 20 MG PO TABS
20.0000 mg | ORAL_TABLET | Freq: Every day | ORAL | Status: DC
  Administered 2019-03-31 – 2019-04-02 (×4): 20 mg via ORAL
  Filled 2019-03-31 (×4): qty 1

## 2019-03-31 MED ORDER — GADOBUTROL 1 MMOL/ML IV SOLN
9.0000 mL | Freq: Once | INTRAVENOUS | Status: AC | PRN
  Administered 2019-03-31: 9 mL via INTRAVENOUS

## 2019-03-31 MED ORDER — MONTELUKAST SODIUM 10 MG PO TABS
10.0000 mg | ORAL_TABLET | Freq: Every day | ORAL | Status: DC
  Administered 2019-03-31 – 2019-04-03 (×4): 10 mg via ORAL
  Filled 2019-03-31 (×5): qty 1

## 2019-03-31 MED ORDER — DULOXETINE HCL 30 MG PO CPEP
30.0000 mg | ORAL_CAPSULE | Freq: Two times a day (BID) | ORAL | Status: DC
  Administered 2019-03-31 – 2019-04-03 (×7): 30 mg via ORAL
  Filled 2019-03-31 (×9): qty 1

## 2019-03-31 MED ORDER — ALBUTEROL SULFATE (2.5 MG/3ML) 0.083% IN NEBU: 2.5000 mg | INHALATION_SOLUTION | Freq: Four times a day (QID) | RESPIRATORY_TRACT | Status: DC | PRN

## 2019-03-31 MED ORDER — ONDANSETRON HCL 4 MG PO TABS: 8.0000 mg | ORAL_TABLET | Freq: Three times a day (TID) | ORAL | Status: DC | PRN

## 2019-03-31 MED ORDER — VITAMIN D 25 MCG (1000 UNIT) PO TABS
5000.0000 [IU] | ORAL_TABLET | Freq: Every day | ORAL | Status: DC
  Administered 2019-03-31 – 2019-04-03 (×4): 5000 [IU] via ORAL
  Filled 2019-03-31 (×4): qty 5

## 2019-03-31 MED ORDER — DALFAMPRIDINE ER 10 MG PO TB12: 10.0000 mg | ORAL_TABLET | Freq: Two times a day (BID) | ORAL | Status: DC

## 2019-03-31 MED ORDER — VITAMIN B-12 1000 MCG PO TABS
2500.0000 ug | ORAL_TABLET | Freq: Every day | ORAL | Status: DC
  Administered 2019-03-31 – 2019-04-03 (×4): 2500 ug via ORAL
  Filled 2019-03-31 (×4): qty 5

## 2019-03-31 MED ORDER — SODIUM CHLORIDE 0.9 % IV SOLN
1000.0000 mg | Freq: Every day | INTRAVENOUS | Status: AC
  Administered 2019-03-31 – 2019-04-01 (×2): 1000 mg via INTRAVENOUS
  Filled 2019-03-31 (×3): qty 8

## 2019-03-31 MED ORDER — OXYCODONE-ACETAMINOPHEN 5-325 MG PO TABS
1.0000 | ORAL_TABLET | ORAL | Status: DC | PRN
  Administered 2019-03-31 – 2019-04-03 (×11): 1 via ORAL
  Filled 2019-03-31 (×11): qty 1

## 2019-03-31 MED ORDER — SODIUM CHLORIDE 0.9 % IV SOLN
INTRAVENOUS | Status: DC
  Administered 2019-03-31: 06:00:00 via INTRAVENOUS

## 2019-03-31 MED ORDER — DIAZEPAM 5 MG PO TABS: 5.0000 mg | ORAL_TABLET | ORAL | Status: DC

## 2019-03-31 MED ORDER — ACETAMINOPHEN 500 MG PO TABS
1000.0000 mg | ORAL_TABLET | Freq: Three times a day (TID) | ORAL | Status: DC
  Administered 2019-03-31 – 2019-04-01 (×4): 1000 mg via ORAL
  Filled 2019-03-31 (×6): qty 2

## 2019-03-31 MED ORDER — BUPROPION HCL ER (XL) 150 MG PO TB24
300.0000 mg | ORAL_TABLET | Freq: Every day | ORAL | Status: DC
  Administered 2019-03-31 – 2019-04-03 (×4): 300 mg via ORAL
  Filled 2019-03-31 (×4): qty 2

## 2019-03-31 MED ORDER — IBUPROFEN 400 MG PO TABS
400.0000 mg | ORAL_TABLET | Freq: Three times a day (TID) | ORAL | Status: DC | PRN
  Administered 2019-04-01 – 2019-04-03 (×5): 400 mg via ORAL
  Filled 2019-03-31 (×5): qty 1

## 2019-03-31 MED ORDER — OXYCODONE-ACETAMINOPHEN 10-325 MG PO TABS: 1.0000 | ORAL_TABLET | ORAL | Status: DC | PRN

## 2019-03-31 MED ORDER — OXYCODONE HCL 5 MG PO TABS
10.0000 mg | ORAL_TABLET | ORAL | Status: DC | PRN
  Administered 2019-03-31 – 2019-04-03 (×12): 10 mg via ORAL
  Filled 2019-03-31 (×12): qty 2

## 2019-03-31 MED ORDER — SODIUM CHLORIDE 0.9 % IV SOLN
INTRAVENOUS | Status: DC
  Administered 2019-04-01: 23:00:00 via INTRAVENOUS

## 2019-03-31 NOTE — ED Notes (Signed)
Per admitting- pt will get solumedrol. Neuro will order

## 2019-03-31 NOTE — Consult Note (Addendum)
NEURO HOSPITALIST CONSULT NOTE   Requestig physician: Dr. Judd Lien  Reason for Consult: Possible MS exacerbation  History obtained from:   Patient and Chart     HPI:                                                                                                                                          Sharon Odom is an 48 y.o. female with MS, patient of Dr. Epimenio Foot at [facility], on [facility] for disease-modifying treatment, who presented to the ED on Monday evening stating that she was having an MS flare. She called her physician who advised her to present to the ED for MRI and IV steroids. She has had sensation of eye and upper extremity jittering, worsening lower extremity weakness, muscle spasms, blurry vision and worsening of her chroinic left hand weakness. She also has had voice hoarseness and feeling lethargic. She endorses having progression of these symptoms for about 5 weeks, with superimposed acute worsening in the last 48 hours.   Of note, she has muscle spasms at baseline and uses a walker. She had a baclofen pump placed 5 weeks ago, with increasing doses of baclofen qweek since then. She states that despite this, her symptoms have worsened to the point that she is struggling to walk. She went to an Ophthalmologist for the blurred vision and states that the Ophthalmologist told her that her eye exam was negative.   MRI of brain, cervical and thoracic spine with and without contrast obtained in the ED revealed multiple chronic demyelinating lesions without acute enhancement.   Past Medical History:  Diagnosis Date  . Allergies   . Anxiety   . Arthritis   . Asthma   . Constipation   . Depression   . GERD (gastroesophageal reflux disease)   . Headache   . History of hiatal hernia   . Multiple sclerosis (HCC)   . Vision abnormalities   . Wears glasses     Past Surgical History:  Procedure Laterality Date  . EXCISION MORTON'S NEUROMA Right   . fallopian tube  removal    . INTRATHECAL PUMP IMPLANT Right 02/18/2019   Procedure: INTRATHECAL PUMP IMPLANT;  Surgeon: Odette Fraction, MD;  Location: [facility] OR;  Service: Neurosurgery;  Laterality: Right;  INTRATHECAL PUMP IMPLANT  . WISDOM TOOTH EXTRACTION      Family History  Problem Relation Age of Onset  . Diabetes Mother   . Healthy Brother               Social History:  reports that she has been smoking cigarettes. She has been smoking about 0.50 packs per day. She has never used smokeless tobacco. She reports current alcohol use. She reports that she does not use drugs.  Allergies  Allergen Reactions  .  Morphine Other (See Comments)    HOME MEDICATIONS:                                                                                                                      No current facility-administered medications on file prior to encounter.    Current Outpatient Medications on File Prior to Encounter  Medication Sig Dispense Refill  . acetaminophen (TYLENOL) 650 MG CR tablet Take 1,300 mg by mouth every 8 (eight) hours as needed for pain.     Marland Kitchen albuterol (PROVENTIL) (2.5 MG/3ML) 0.083% nebulizer solution Take 2.5 mg by nebulization every 6 (six) hours as needed for wheezing or shortness of breath.    Marland Kitchen albuterol (VENTOLIN HFA) 108 (90 Base) MCG/ACT inhaler Inhale 1-2 puffs into the lungs every 6 (six) hours as needed for wheezing or shortness of breath.     . Alemtuzumab (LEMTRADA) 12 MG/1.2ML SOLN Inject 12 mg into the vein See admin instructions. Once a year    . baclofen (LIORESAL) 20 MG tablet 1 PO Q6 hours PRN for breakthrough spasms (Patient taking differently: Take 20 mg by mouth 4 (four) times daily. ) 120 each 3  . botulinum toxin Type A (BOTOX) 100 units SOLR injection Inject 600 Units into the muscle every 3 (three) months.    Marland Kitchen buPROPion (WELLBUTRIN XL) 300 MG 24 hr tablet Take 300 mg by mouth daily.     . Cholecalciferol (VITAMIN D-3) 5000 units TABS Take 5,000 Units by mouth daily.      . Cyanocobalamin (VITAMIN B-12) 2500 MCG SUBL Place 2,500 mcg under the tongue daily.     Marland Kitchen dalfampridine 10 MG TB12 One po bid (Patient taking differently: Take 10 mg by mouth 2 (two) times daily. One po bid) 60 tablet 11  . diazepam (VALIUM) 5 MG tablet Take up to 3 pills a day for spasticity (Patient taking differently: Take 5-10 mg by mouth See admin instructions. Take up to 3 pills a day for spasticity) 90 tablet 3  . DULoxetine (CYMBALTA) 30 MG capsule Take 1 capsule (30 mg total) by mouth 2 (two) times daily. 60 capsule 3  . ibuprofen (ADVIL,MOTRIN) 200 MG tablet Take 400 mg by mouth every 8 (eight) hours as needed for moderate pain.     . montelukast (SINGULAIR) 10 MG tablet Take 10 mg by mouth daily.     . ondansetron (ZOFRAN) 8 MG tablet as needed With infusions (Patient taking differently: Take 8 mg by mouth every 8 (eight) hours as needed for nausea or vomiting. as needed With infusions) 20 tablet 1  . oxybutynin (DITROPAN-XL) 10 MG 24 hr tablet Take 10 mg by mouth daily.    Marland Kitchen oxyCODONE-acetaminophen (PERCOCET) 10-325 MG tablet Take 1 tablet by mouth 2 (two) times daily as needed for pain. 60 tablet 0  . ranitidine (ZANTAC) 300 MG tablet Take 300 mg by mouth daily as needed for heartburn.    . cyclobenzaprine (FLEXERIL) 10 MG tablet Take 1 tablet (10 mg total)  by mouth 3 (three) times daily. (Patient not taking: Reported on 03/31/2019) 90 tablet 5     ROS:                                                                                                                                       Has diffuse muscle aches, back pain and limb pain. Has fatigue and tremors. Also with difficulty swallowing. Other ROS as per HPI. Denies any recent illnesses. All other systems negative.    Blood pressure 114/76, pulse 88, temperature 98.4 F (36.9 C), temperature source Oral, resp. rate 11, SpO2 96 %.   General Examination:                                                                                                        Physical Exam  HEENT-  [place]/AT    Lungs- Respirations unlabored Extremities- No edema  Neurological Examination Mental Status: Alert, but with sedated/drowsuy affect. Fully oriented except for day of week. Speech fluent but with mild dysarthria consistent with sedation. Able to follow all commands. Naming intact.   Cranial Nerves: II: Visual fields intact with no extinction to DSS. Pupils 5 mm reacting to 4 mm with penlight.   III,IV, VI: No ptosis. EOM are full with saccadic pursuits noted.  V,VII: Face symmetric. Temp sensation equal bilaterally.  VIII: hearing intact to voice IX,X: No hypophonia XI: Head midline XII: midline tongue extension Motor: RUE with mildly increased tone. Bradykinesia and 4+/5 strength proximal and distal.  LUE with 3/5 strength proximally, unable to extend digits, which are held in flexion with increased flexor tone.  RLE 3/5 with slow movement and mildly increased tone.  LLE unable to elevate above bed with mildly increased tone.  Sensory: Decreased temp sensation to upper extremities bilaterally. Subjectively intact to BLE.  Deep Tendon Reflexes: Hypoactive brachioradialis, biceps and patellae bilaterally.  Plantars: Mute bilaterally  Cerebellar: Slow, nonataxic FNF on right. Unable to perform on left due to severe pain and muscle spasm.  Gait: Unable to assess   Lab Results: Basic Metabolic Panel: Recent Labs  Lab 03/30/19 1803  NA 136  K 4.0  CL 101  CO2 24  GLUCOSE 99  BUN 13  CREATININE 0.86  CALCIUM 9.6    CBC: Recent Labs  Lab 03/30/19 1803  WBC 11.0*  HGB 15.5*  HCT 48.9*  MCV 102.5*  PLT 271    Cardiac Enzymes: No results for input(s): CKTOTAL, CKMB, CKMBINDEX, TROPONINI in  the last 168 hours.  Lipid Panel: No results for input(s): CHOL, TRIG, HDL, CHOLHDL, VLDL, LDLCALC in the last 168 hours.  Imaging: Mr Laqueta Jean And Wo Contrast  Result Date: 03/31/2019 CLINICAL DATA:  Initial evaluation  for increasing leg weakness, spasms, blurry vision. History of multiple sclerosis. EXAM: MRI HEAD WITHOUT AND WITH CONTRAST MRI CERVICAL SPINE WITHOUT AND WITH CONTRAST MRI THORACIC SPINE WITHOUT AND WITH CONTRAST TECHNIQUE: Multiplanar, multiecho pulse sequences of the brain and surrounding structures, and cervical spine, to include the craniocervical junction and cervicothoracic junction, as well as the thoracic spine were obtained without and with intravenous contrast. CONTRAST:  106mL GADAVIST GADOBUTROL 1 MMOL/ML IV SOLN COMPARISON:  Previous MRI from 02/05/2018. FINDINGS: MRI HEAD FINDINGS Brain: Cerebral volume stable, and remains within normal limits for age. Multiple scattered patchy T2/FLAIR hyperintensities seen involving the periventricular, deep, and juxta cortical white matter of both cerebral hemispheres, consistent with provided history of multiple sclerosis. Scattered patchy involvement of the pons and medulla as well as the cerebellum present as well. In comparison with previous MRI, overall appearance has mildly progressed as evidence by a few scattered new and/or more prominent lesions as compared to previous. For example, few small juxta cortical lesions seen within the right parietal region appear to be new from previous (series 15, image 17). Few of the additional pre cystic lesions or also mildly increased in size. No abnormal restricted diffusion or enhancement to suggest active demyelination on today's exam. No evidence for acute or subacute infarct. Gray-white matter differentiation maintained. No encephalomalacia to suggest chronic cortical infarction. No foci of susceptibility artifact to suggest acute or chronic intracranial hemorrhage. No mass lesion, midline shift or mass effect. No hydrocephalus. No extra-axial fluid collection. Pituitary gland suprasellar region normal. Midline structures intact. Vascular: Major intracranial vascular flow voids are maintained. Skull and upper cervical  spine: Craniocervical junction within normal limits. Bone marrow signal intensity normal. 19 mm sclerotic lesion at the right frontal scalp noted, indeterminate, but stable from previous, and of doubtful significance. Scalp soft tissues demonstrate no acute finding. Sinuses/Orbits: Globes and orbital soft tissues within normal limits. Paranasal sinuses are clear. No mastoid effusion. Inner ear structures normal. Other: None. MRI CERVICAL SPINE FINDINGS Alignment: Straightening of the normal cervical lordosis. No listhesis. Vertebrae: Vertebral body height maintained without evidence for acute or chronic fracture. Bone marrow signal intensity within normal limits. Benign hemangioma noted within the C7 vertebral body. No other discrete or worrisome osseous lesions. No abnormal marrow edema. Cord: Patchy signal abnormality within the right hemi cord at the level of C2 (series 4, image 8), not definite seen on previous. Additional patchy signal abnormality within the central cord at the level of C2-3 (series 4, image 11), more prominent as compared to previous. Patchy signal abnormality within the central cord at the level of C3, slightly more prominent (series 4, image 15). Focal signal abnormality within the left dorsal cord at the level of C3-4 (series 4, image 17), more prominent from previous. Minimal patchy signal abnormality within the central cord at the level of C4 (series 4, image 19), more prominent. Probable additional subtle lesion within the right dorsal cord at the level of C6 (series 4, image 29), also more prominent. Findings consistent with history of demyelinating disease, and appear progressed from previous. No abnormal enhancement to suggest active demyelination. Posterior Fossa, vertebral arteries, paraspinal tissues: Craniocervical junction within normal limits. Paraspinous and prevertebral soft tissues are normal. Normal intravascular flow voids seen within the vertebral arteries  bilaterally.  Disc levels: C2-C3: Mild disc bulge with left-sided uncovertebral hypertrophy. No significant spinal stenosis. Mild left C3 foraminal narrowing. C3-C4: Mild disc bulge with left-sided uncovertebral hypertrophy. No significant spinal stenosis. Mild left C4 foraminal narrowing. C4-C5: Mild disc bulge with right-sided uncovertebral hypertrophy. Flattening of the ventral thecal sac without significant spinal stenosis. Moderate right C5 foraminal stenosis. C5-C6: Shallow right paracentral disc protrusion indents the right ventral thecal sac (series 5, image 27). Lateral extension into the right neural foramen. Mild spinal stenosis without cord impingement. Moderate right with mild left C6 foraminal narrowing. C6-C7: Shallow broad-based central disc protrusion indents the ventral thecal sac. Mild spinal stenosis or cord impingement. Superimposed uncovertebral hypertrophy with resultant mild left C7 foraminal stenosis. C7-T1:  Unremarkable. MRI THORACIC SPINE FINDINGS Alignment: Vertebral bodies normally aligned with preservation of the normal thoracic kyphosis. No listhesis or subluxation. Vertebrae: Vertebral body height maintained without evidence for acute or chronic fracture. Bone marrow signal intensity within normal limits. Few scattered benign hemangiomata noted, with small atypical hemangioma noted within the T5 vertebral body. No worrisome osseous lesions. No abnormal marrow edema or enhancement. Cord: Signal intensity within the thoracic spinal cord is within normal limits. No definite cord lesions to suggest demyelinating disease. Normal cord caliber and morphology. No abnormal enhancement. Paraspinal soft tissues: Paraspinous soft tissues within normal limits. Partially visualized lungs are clear. Visualized visceral structures unremarkable. Disc levels: No significant disc pathology seen within the thoracic spine. No disc bulge or focal disc protrusion. No significant facet disease. No canal or neural  foraminal stenosis. No impingement. IMPRESSION: MRI HEAD IMPRESSION: 1. Patchy multifocal signal changes involving the supratentorial and infratentorial cerebral white matter as above, compatible with provided history of multiple sclerosis. Overall, changes are mildly progressed relative to previous MRI from 2019. No evidence for active demyelination. 2. No other acute intracranial abnormality. MRI CERVICAL SPINE IMPRESSION: 1. Patchy multifocal cord signal abnormality involving the cervical spinal cord as detailed above, consistent with history of multiple sclerosis. Overall, appearance appears mildly progressed relative to 2019. No evidence for active demyelination. 2. Degenerative spondylosis at C5-6 and C6-7 with resultant mild spinal stenosis, with moderate right C6 and mild left C7 foraminal narrowing. 3. Mild left C3 and C4 foraminal stenosis, with moderate right C5 foraminal narrowing related to disc bulge and uncovertebral disease. MRI THORACIC SPINE IMPRESSION: Normal MRI of the thoracic spine and spinal cord. No evidence for demyelinating disease. No significant disc pathology, stenosis, or neural impingement. Electronically Signed   By: Rise Mu M.D.   On: 03/31/2019 01:02   Mr Cervical Spine W Or Wo Contrast  Result Date: 03/31/2019 CLINICAL DATA:  Initial evaluation for increasing leg weakness, spasms, blurry vision. History of multiple sclerosis. EXAM: MRI HEAD WITHOUT AND WITH CONTRAST MRI CERVICAL SPINE WITHOUT AND WITH CONTRAST MRI THORACIC SPINE WITHOUT AND WITH CONTRAST TECHNIQUE: Multiplanar, multiecho pulse sequences of the brain and surrounding structures, and cervical spine, to include the craniocervical junction and cervicothoracic junction, as well as the thoracic spine were obtained without and with intravenous contrast. CONTRAST:  74mL GADAVIST GADOBUTROL 1 MMOL/ML IV SOLN COMPARISON:  Previous MRI from 02/05/2018. FINDINGS: MRI HEAD FINDINGS Brain: Cerebral volume stable,  and remains within normal limits for age. Multiple scattered patchy T2/FLAIR hyperintensities seen involving the periventricular, deep, and juxta cortical white matter of both cerebral hemispheres, consistent with provided history of multiple sclerosis. Scattered patchy involvement of the pons and medulla as well as the cerebellum present as well. In comparison with previous MRI,  overall appearance has mildly progressed as evidence by a few scattered new and/or more prominent lesions as compared to previous. For example, few small juxta cortical lesions seen within the right parietal region appear to be new from previous (series 15, image 17). Few of the additional pre cystic lesions or also mildly increased in size. No abnormal restricted diffusion or enhancement to suggest active demyelination on today's exam. No evidence for acute or subacute infarct. Gray-white matter differentiation maintained. No encephalomalacia to suggest chronic cortical infarction. No foci of susceptibility artifact to suggest acute or chronic intracranial hemorrhage. No mass lesion, midline shift or mass effect. No hydrocephalus. No extra-axial fluid collection. Pituitary gland suprasellar region normal. Midline structures intact. Vascular: Major intracranial vascular flow voids are maintained. Skull and upper cervical spine: Craniocervical junction within normal limits. Bone marrow signal intensity normal. 19 mm sclerotic lesion at the right frontal scalp noted, indeterminate, but stable from previous, and of doubtful significance. Scalp soft tissues demonstrate no acute finding. Sinuses/Orbits: Globes and orbital soft tissues within normal limits. Paranasal sinuses are clear. No mastoid effusion. Inner ear structures normal. Other: None. MRI CERVICAL SPINE FINDINGS Alignment: Straightening of the normal cervical lordosis. No listhesis. Vertebrae: Vertebral body height maintained without evidence for acute or chronic fracture. Bone  marrow signal intensity within normal limits. Benign hemangioma noted within the C7 vertebral body. No other discrete or worrisome osseous lesions. No abnormal marrow edema. Cord: Patchy signal abnormality within the right hemi cord at the level of C2 (series 4, image 8), not definite seen on previous. Additional patchy signal abnormality within the central cord at the level of C2-3 (series 4, image 11), more prominent as compared to previous. Patchy signal abnormality within the central cord at the level of C3, slightly more prominent (series 4, image 15). Focal signal abnormality within the left dorsal cord at the level of C3-4 (series 4, image 17), more prominent from previous. Minimal patchy signal abnormality within the central cord at the level of C4 (series 4, image 19), more prominent. Probable additional subtle lesion within the right dorsal cord at the level of C6 (series 4, image 29), also more prominent. Findings consistent with history of demyelinating disease, and appear progressed from previous. No abnormal enhancement to suggest active demyelination. Posterior Fossa, vertebral arteries, paraspinal tissues: Craniocervical junction within normal limits. Paraspinous and prevertebral soft tissues are normal. Normal intravascular flow voids seen within the vertebral arteries bilaterally. Disc levels: C2-C3: Mild disc bulge with left-sided uncovertebral hypertrophy. No significant spinal stenosis. Mild left C3 foraminal narrowing. C3-C4: Mild disc bulge with left-sided uncovertebral hypertrophy. No significant spinal stenosis. Mild left C4 foraminal narrowing. C4-C5: Mild disc bulge with right-sided uncovertebral hypertrophy. Flattening of the ventral thecal sac without significant spinal stenosis. Moderate right C5 foraminal stenosis. C5-C6: Shallow right paracentral disc protrusion indents the right ventral thecal sac (series 5, image 27). Lateral extension into the right neural foramen. Mild spinal  stenosis without cord impingement. Moderate right with mild left C6 foraminal narrowing. C6-C7: Shallow broad-based central disc protrusion indents the ventral thecal sac. Mild spinal stenosis or cord impingement. Superimposed uncovertebral hypertrophy with resultant mild left C7 foraminal stenosis. C7-T1:  Unremarkable. MRI THORACIC SPINE FINDINGS Alignment: Vertebral bodies normally aligned with preservation of the normal thoracic kyphosis. No listhesis or subluxation. Vertebrae: Vertebral body height maintained without evidence for acute or chronic fracture. Bone marrow signal intensity within normal limits. Few scattered benign hemangiomata noted, with small atypical hemangioma noted within the T5 vertebral body. No worrisome  osseous lesions. No abnormal marrow edema or enhancement. Cord: Signal intensity within the thoracic spinal cord is within normal limits. No definite cord lesions to suggest demyelinating disease. Normal cord caliber and morphology. No abnormal enhancement. Paraspinal soft tissues: Paraspinous soft tissues within normal limits. Partially visualized lungs are clear. Visualized visceral structures unremarkable. Disc levels: No significant disc pathology seen within the thoracic spine. No disc bulge or focal disc protrusion. No significant facet disease. No canal or neural foraminal stenosis. No impingement. IMPRESSION: MRI HEAD IMPRESSION: 1. Patchy multifocal signal changes involving the supratentorial and infratentorial cerebral white matter as above, compatible with provided history of multiple sclerosis. Overall, changes are mildly progressed relative to previous MRI from 2019. No evidence for active demyelination. 2. No other acute intracranial abnormality. MRI CERVICAL SPINE IMPRESSION: 1. Patchy multifocal cord signal abnormality involving the cervical spinal cord as detailed above, consistent with history of multiple sclerosis. Overall, appearance appears mildly progressed relative to  2019. No evidence for active demyelination. 2. Degenerative spondylosis at C5-6 and C6-7 with resultant mild spinal stenosis, with moderate right C6 and mild left C7 foraminal narrowing. 3. Mild left C3 and C4 foraminal stenosis, with moderate right C5 foraminal narrowing related to disc bulge and uncovertebral disease. MRI THORACIC SPINE IMPRESSION: Normal MRI of the thoracic spine and spinal cord. No evidence for demyelinating disease. No significant disc pathology, stenosis, or neural impingement. Electronically Signed   By: Jeannine Boga M.D.   On: 03/31/2019 01:02   Mr Thoracic Spine W Wo Contrast  Result Date: 03/31/2019 CLINICAL DATA:  Initial evaluation for increasing leg weakness, spasms, blurry vision. History of multiple sclerosis. EXAM: MRI HEAD WITHOUT AND WITH CONTRAST MRI CERVICAL SPINE WITHOUT AND WITH CONTRAST MRI THORACIC SPINE WITHOUT AND WITH CONTRAST TECHNIQUE: Multiplanar, multiecho pulse sequences of the brain and surrounding structures, and cervical spine, to include the craniocervical junction and cervicothoracic junction, as well as the thoracic spine were obtained without and with intravenous contrast. CONTRAST:  4mL GADAVIST GADOBUTROL 1 MMOL/ML IV SOLN COMPARISON:  Previous MRI from 02/05/2018. FINDINGS: MRI HEAD FINDINGS Brain: Cerebral volume stable, and remains within normal limits for age. Multiple scattered patchy T2/FLAIR hyperintensities seen involving the periventricular, deep, and juxta cortical white matter of both cerebral hemispheres, consistent with provided history of multiple sclerosis. Scattered patchy involvement of the pons and medulla as well as the cerebellum present as well. In comparison with previous MRI, overall appearance has mildly progressed as evidence by a few scattered new and/or more prominent lesions as compared to previous. For example, few small juxta cortical lesions seen within the right parietal region appear to be new from previous (series  15, image 17). Few of the additional pre cystic lesions or also mildly increased in size. No abnormal restricted diffusion or enhancement to suggest active demyelination on today's exam. No evidence for acute or subacute infarct. Gray-white matter differentiation maintained. No encephalomalacia to suggest chronic cortical infarction. No foci of susceptibility artifact to suggest acute or chronic intracranial hemorrhage. No mass lesion, midline shift or mass effect. No hydrocephalus. No extra-axial fluid collection. Pituitary gland suprasellar region normal. Midline structures intact. Vascular: Major intracranial vascular flow voids are maintained. Skull and upper cervical spine: Craniocervical junction within normal limits. Bone marrow signal intensity normal. 19 mm sclerotic lesion at the right frontal scalp noted, indeterminate, but stable from previous, and of doubtful significance. Scalp soft tissues demonstrate no acute finding. Sinuses/Orbits: Globes and orbital soft tissues within normal limits. Paranasal sinuses are clear. No mastoid  effusion. Inner ear structures normal. Other: None. MRI CERVICAL SPINE FINDINGS Alignment: Straightening of the normal cervical lordosis. No listhesis. Vertebrae: Vertebral body height maintained without evidence for acute or chronic fracture. Bone marrow signal intensity within normal limits. Benign hemangioma noted within the C7 vertebral body. No other discrete or worrisome osseous lesions. No abnormal marrow edema. Cord: Patchy signal abnormality within the right hemi cord at the level of C2 (series 4, image 8), not definite seen on previous. Additional patchy signal abnormality within the central cord at the level of C2-3 (series 4, image 11), more prominent as compared to previous. Patchy signal abnormality within the central cord at the level of C3, slightly more prominent (series 4, image 15). Focal signal abnormality within the left dorsal cord at the level of C3-4  (series 4, image 17), more prominent from previous. Minimal patchy signal abnormality within the central cord at the level of C4 (series 4, image 19), more prominent. Probable additional subtle lesion within the right dorsal cord at the level of C6 (series 4, image 29), also more prominent. Findings consistent with history of demyelinating disease, and appear progressed from previous. No abnormal enhancement to suggest active demyelination. Posterior Fossa, vertebral arteries, paraspinal tissues: Craniocervical junction within normal limits. Paraspinous and prevertebral soft tissues are normal. Normal intravascular flow voids seen within the vertebral arteries bilaterally. Disc levels: C2-C3: Mild disc bulge with left-sided uncovertebral hypertrophy. No significant spinal stenosis. Mild left C3 foraminal narrowing. C3-C4: Mild disc bulge with left-sided uncovertebral hypertrophy. No significant spinal stenosis. Mild left C4 foraminal narrowing. C4-C5: Mild disc bulge with right-sided uncovertebral hypertrophy. Flattening of the ventral thecal sac without significant spinal stenosis. Moderate right C5 foraminal stenosis. C5-C6: Shallow right paracentral disc protrusion indents the right ventral thecal sac (series 5, image 27). Lateral extension into the right neural foramen. Mild spinal stenosis without cord impingement. Moderate right with mild left C6 foraminal narrowing. C6-C7: Shallow broad-based central disc protrusion indents the ventral thecal sac. Mild spinal stenosis or cord impingement. Superimposed uncovertebral hypertrophy with resultant mild left C7 foraminal stenosis. C7-T1:  Unremarkable. MRI THORACIC SPINE FINDINGS Alignment: Vertebral bodies normally aligned with preservation of the normal thoracic kyphosis. No listhesis or subluxation. Vertebrae: Vertebral body height maintained without evidence for acute or chronic fracture. Bone marrow signal intensity within normal limits. Few scattered benign  hemangiomata noted, with small atypical hemangioma noted within the T5 vertebral body. No worrisome osseous lesions. No abnormal marrow edema or enhancement. Cord: Signal intensity within the thoracic spinal cord is within normal limits. No definite cord lesions to suggest demyelinating disease. Normal cord caliber and morphology. No abnormal enhancement. Paraspinal soft tissues: Paraspinous soft tissues within normal limits. Partially visualized lungs are clear. Visualized visceral structures unremarkable. Disc levels: No significant disc pathology seen within the thoracic spine. No disc bulge or focal disc protrusion. No significant facet disease. No canal or neural foraminal stenosis. No impingement. IMPRESSION: MRI HEAD IMPRESSION: 1. Patchy multifocal signal changes involving the supratentorial and infratentorial cerebral white matter as above, compatible with provided history of multiple sclerosis. Overall, changes are mildly progressed relative to previous MRI from 2019. No evidence for active demyelination. 2. No other acute intracranial abnormality. MRI CERVICAL SPINE IMPRESSION: 1. Patchy multifocal cord signal abnormality involving the cervical spinal cord as detailed above, consistent with history of multiple sclerosis. Overall, appearance appears mildly progressed relative to 2019. No evidence for active demyelination. 2. Degenerative spondylosis at C5-6 and C6-7 with resultant mild spinal stenosis, with moderate right C6  and mild left C7 foraminal narrowing. 3. Mild left C3 and C4 foraminal stenosis, with moderate right C5 foraminal narrowing related to disc bulge and uncovertebral disease. MRI THORACIC SPINE IMPRESSION: Normal MRI of the thoracic spine and spinal cord. No evidence for demyelinating disease. No significant disc pathology, stenosis, or neural impingement. Electronically Signed   By: Rise MuBenjamin  McClintock M.D.   On: 03/31/2019 01:02    Assessment:  48 year old female with multiple  sclerosis, presenting with progressive worsening of motor function, blurred vision and weakness during ambulation since baclofen pump was placed 5 weeks ago.  1. Blurred vision and increasing weakness are potential side effects of baclofen. Given the worsening of her symptoms progressively over the past 5 weeks with baclofen pump dose titration, side effect of baclofen is felt to be most likely. Will need dose decreased tomorrow with [facility].  2. Hold off on IV Solumedrol given no enhancing lesions on MRI as well as baclofen side effect as the most likely etiology for her worsening symptoms.   Recommendaitions: 1. Call [facility] in the AM for reprogramming of baclofen pump to administer lower dose.  2. Observe for possible improvement. If improvement, consider further baclofen dose reduction to optimize balance between management of her spasticity without increasing muscle weakness. If no improvement with baclofen dose reduction, call Neurology for further assessment and possible initiation of IV Solumedrol.  3. IVF for possible volume depletion. Oral mucosa appear dry.    Electronically signed: Dr. Caryl PinaEric Julian Askin 03/31/2019, 1:36 AM

## 2019-03-31 NOTE — ED Notes (Addendum)
PT's baclofen dose titrated from 357mcg a day to 341.36mcg a day currently.

## 2019-03-31 NOTE — ED Notes (Signed)
John, RN, given report.

## 2019-03-31 NOTE — ED Notes (Signed)
Admitting provider bedside 

## 2019-03-31 NOTE — Consult Note (Signed)
Reason for Consult:[facility] felt patients IT Baclofen pump was turned up to high Referring Physician: Dr. Raford PitcherLindzen  Sharon Odom is an 48 y.o. female.  HPI: Ms. Sharon Odom is a 48 year old female with past medical history of multiple sclerosis, anxiety, arthritis, asthma, GERD presenting to the emergency department for increasing leg weakness, spasms, blurry vision. She had a baclofen pump placed on  02/18/19 by Dr. Ollen BowlHarkins after having an excellent Baclofen pump trial.  She is on a very low dose of baclofen and we have been seeing the patient weekly to make small adjustments in order to get her into the more therapeutic dosing range.  During this time the patient has also been reporting worsening MS symptoms.  She was seen by her neurologist and the pump dosing was explained a bit further to her.  She was also started on Percocet for pain which she finds helpful.  The spasticity in her left leg has improved but the patient has continued to have weakness.  Dr. Ollen BowlHarkins felt the patient was having a exacerbation of her MS symptoms and new MRI's were ordered.  Unfortunately those were not completed on the scheduled date due to [facility].    The patient was seen last Wednesday which time her pump was again increased 20 percent.  This is standard and the same dosing we have been doing each week.  The patient reports no side effects without change in dose.   As her spasticity has improved we wanted her to proceed with physical therapy to work on improving her strength as weakness is expected.  We were having to obtain imaging to evaluate for any lesions prior to her beginning physical therapy.    Today the patient states that she began developing new symptoms on Sunday.  Where most of her symptoms have previously been on the left side she then began developing spasms in the right arm and leg.    She is still having cramping and spasming and the left side but that appears to be at baseline.   She has also developed some blurred vision.  She did go to the ophthalmologist yesterday and per the patient her findings were all normal.  The patient called our clinic late after noon yesterday reporting severe pain and swallowing difficulties.  We recommended she be evaluated in the emergency room.  The patient states today that she has not reached out to Dr. Epimenio FootSater yet.  She is unsure if he has been made aware.      She states she presented to the emergency room to receive IV steroids as that is what typically helps to resolve her MS "flare ups".  She is frustrated that we are turning the pump down rather than up.    Past Medical History:  Diagnosis Date   Allergies    Anxiety    Arthritis    Asthma    Constipation    Depression    GERD (gastroesophageal reflux disease)    Headache    History of hiatal hernia    Multiple sclerosis (HCC)    Vision abnormalities    Wears glasses     Past Surgical History:  Procedure Laterality Date   EXCISION MORTON'S NEUROMA Right    fallopian tube removal     INTRATHECAL PUMP IMPLANT Right 02/18/2019   Procedure: INTRATHECAL PUMP IMPLANT;  Surgeon: Odette FractionHarkins, Paul, MD;  [facility];  Service: Neurosurgery;  Laterality: Right;  INTRATHECAL PUMP IMPLANT   WISDOM TOOTH EXTRACTION  Family History  Problem Relation Age of Onset   Diabetes Mother    Healthy Brother     Social History:  reports that she has been smoking cigarettes. She has been smoking about 0.50 packs per day. She has never used smokeless tobacco. She reports current alcohol use. She reports that she does not use drugs.  Allergies:  Allergies  Allergen Reactions   Morphine Other (See Comments)    Medications:  Scheduled:  acetaminophen  1,000 mg Oral Q8H   baclofen  20 mg Oral TID   buPROPion  300 mg Oral Daily   cholecalciferol  5,000 Units Oral Daily   dalfampridine  10 mg Oral BID   diazepam  5-10 mg Oral See admin instructions    DULoxetine  30 mg Oral BID   enoxaparin (LOVENOX) injection  40 mg Subcutaneous Q24H   famotidine  20 mg Oral QHS   montelukast  10 mg Oral Daily   oxybutynin  10 mg Oral Daily   polyethylene glycol  17 g Oral Daily   sodium chloride flush  3 mL Intravenous Once   vitamin B-12  2,500 mcg Oral Daily    Results for orders placed or performed during the hospital encounter of 03/30/19 (from the past 48 hour(s))  Basic metabolic panel     Status: None   Collection Time: 03/30/19  6:03 PM  Result Value Ref Range   Sodium 136 135 - 145 mmol/L   Potassium 4.0 3.5 - 5.1 mmol/L   Chloride 101 98 - 111 mmol/L   CO2 24 22 - 32 mmol/L   Glucose, Bld 99 70 - 99 mg/dL   BUN 13 6 - 20 mg/dL   Creatinine, Ser 1.610.86 0.44 - 1.00 mg/dL   Calcium 9.6 8.9 - 09.610.3 mg/dL   GFR calc non Af Amer >60 >60 mL/min   GFR calc Af Amer >60 >60 mL/min   Anion gap 11 5 - 15    Comment: Performed at [facility], [address] [place], [place] [address]  CBC     Status: Abnormal   Collection Time: 03/30/19  6:03 PM  Result Value Ref Range   WBC 11.0 (H) 4.0 - 10.5 K/uL   RBC 4.77 3.87 - 5.11 MIL/uL   Hemoglobin 15.5 (H) 12.0 - 15.0 g/dL   HCT 98.148.9 (H) 19.136.0 - 47.846.0 %   MCV 102.5 (H) 80.0 - 100.0 fL   MCH 32.5 26.0 - 34.0 pg   MCHC 31.7 30.0 - 36.0 g/dL   RDW 29.512.5 62.111.5 - 30.815.5 %   Platelets 271 150 - 400 K/uL   nRBC 0.0 0.0 - 0.2 %    Comment: Performed at [facility], [address] [place], [place] [address]  I-Stat beta hCG blood, ED     Status: None   Collection Time: 03/30/19  6:38 PM  Result Value Ref Range   I-stat hCG, quantitative <5.0 <5 mIU/mL   Comment 3            Comment:   GEST. AGE      CONC.  (mIU/mL)   <=1 WEEK        5 - 50     2 WEEKS       50 - 500     3 WEEKS       100 - 10,000     4 WEEKS     1,000 - 30,000        FEMALE AND  NON-PREGNANT FEMALE:     LESS THAN 5 mIU/mL   SARS CORONAVIRUS 2 (TAT 6-24 HRS) Nasopharyngeal Nasopharyngeal Swab     Status: None    Collection Time: 03/30/19  7:27 PM   Specimen: Nasopharyngeal Swab  Result Value Ref Range   SARS Coronavirus 2 NEGATIVE NEGATIVE    Comment: (NOTE) SARS-CoV-2 target nucleic acids are NOT DETECTED. The SARS-CoV-2 RNA is generally detectable in upper and lower respiratory specimens during the acute phase of infection. Negative results do not preclude SARS-CoV-2 infection, do not rule out co-infections with other pathogens, and should not be used as the sole basis for treatment or other patient management decisions. Negative results must be combined with clinical observations, patient history, and epidemiological information. The expected result is Negative. Fact Sheet for Patients: HairSlick.no Fact Sheet for Healthcare Providers: quierodirigir.com This test is not yet approved or cleared by the [place] FDA and  has been authorized for detection and/or diagnosis of SARS-CoV-2 by FDA under an Emergency Use Authorization (EUA). This EUA will remain  in effect (meaning this test can be used) for the duration of the COVID-19 declaration under Section 56 4(b)(1) of the Act, 21 U.S.C. section 360bbb-3(b)(1), unless the authorization is terminated or revoked sooner. Performed at [facility], [address], [place], [place] [address]   HIV Antibody (routine testing w rflx)     Status: None   Collection Time: 03/31/19  5:51 AM  Result Value Ref Range   HIV Screen 4th Generation wRfx NON REACTIVE NON REACTIVE    Comment: Performed at [facility], [address], [place], [place] [address]  Magnesium     Status: None   Collection Time: 03/31/19  5:51 AM  Result Value Ref Range   Magnesium 2.2 1.7 - 2.4 mg/dL    Comment: Performed at [facility], [address], [place], [place] [address]  Phosphorus     Status: None   Collection Time: 03/31/19  5:51 AM  Result Value Ref Range   Phosphorus 4.1 2.5 - 4.6  mg/dL    Comment: Performed at [facility], [address] [place], [place] [address]  TSH     Status: None   Collection Time: 03/31/19  5:51 AM  Result Value Ref Range   TSH 1.184 0.350 - 4.500 uIU/mL    Comment: Performed by a 3rd Generation assay with a functional sensitivity of <=0.01 uIU/mL. Performed at [facility], [address] [place], [place] [address]   Urinalysis, Routine w reflex microscopic     Status: Abnormal   Collection Time: 03/31/19  7:43 AM  Result Value Ref Range   Color, Urine YELLOW YELLOW   APPearance CLEAR CLEAR   Specific Gravity, Urine 1.026 1.005 - 1.030   pH 5.0 5.0 - 8.0   Glucose, UA NEGATIVE NEGATIVE mg/dL   Hgb urine dipstick NEGATIVE NEGATIVE   Bilirubin Urine NEGATIVE NEGATIVE   Ketones, ur 20 (A) NEGATIVE mg/dL   Protein, ur NEGATIVE NEGATIVE mg/dL   Nitrite NEGATIVE NEGATIVE   Leukocytes,Ua NEGATIVE NEGATIVE    Comment: Performed at [facility], [address] [place], [place] [address]    Mr Laqueta Jean And Wo Contrast  Result Date: 03/31/2019 CLINICAL DATA:  Initial evaluation for increasing leg weakness, spasms, blurry vision. History of multiple sclerosis. EXAM: MRI HEAD WITHOUT AND WITH CONTRAST MRI CERVICAL SPINE WITHOUT AND WITH CONTRAST MRI THORACIC SPINE WITHOUT AND WITH CONTRAST TECHNIQUE:  Multiplanar, multiecho pulse sequences of the brain and surrounding structures, and cervical spine, to include the craniocervical junction and cervicothoracic junction, as well as the thoracic spine were obtained without and with intravenous contrast. CONTRAST:  45mL GADAVIST GADOBUTROL 1 MMOL/ML IV SOLN COMPARISON:  Previous MRI from 02/05/2018. FINDINGS: MRI HEAD FINDINGS Brain: Cerebral volume stable, and remains within normal limits for age. Multiple scattered patchy T2/FLAIR hyperintensities seen involving the periventricular, deep, and juxta cortical white matter of both cerebral hemispheres, consistent with provided history of  multiple sclerosis. Scattered patchy involvement of the pons and medulla as well as the cerebellum present as well. In comparison with previous MRI, overall appearance has mildly progressed as evidence by a few scattered new and/or more prominent lesions as compared to previous. For example, few small juxta cortical lesions seen within the right parietal region appear to be new from previous (series 15, image 17). Few of the additional pre cystic lesions or also mildly increased in size. No abnormal restricted diffusion or enhancement to suggest active demyelination on today's exam. No evidence for acute or subacute infarct. Gray-white matter differentiation maintained. No encephalomalacia to suggest chronic cortical infarction. No foci of susceptibility artifact to suggest acute or chronic intracranial hemorrhage. No mass lesion, midline shift or mass effect. No hydrocephalus. No extra-axial fluid collection. Pituitary gland suprasellar region normal. Midline structures intact. Vascular: Major intracranial vascular flow voids are maintained. Skull and upper cervical spine: Craniocervical junction within normal limits. Bone marrow signal intensity normal. 19 mm sclerotic lesion at the right frontal scalp noted, indeterminate, but stable from previous, and of doubtful significance. Scalp soft tissues demonstrate no acute finding. Sinuses/Orbits: Globes and orbital soft tissues within normal limits. Paranasal sinuses are clear. No mastoid effusion. Inner ear structures normal. Other: None. MRI CERVICAL SPINE FINDINGS Alignment: Straightening of the normal cervical lordosis. No listhesis. Vertebrae: Vertebral body height maintained without evidence for acute or chronic fracture. Bone marrow signal intensity within normal limits. Benign hemangioma noted within the C7 vertebral body. No other discrete or worrisome osseous lesions. No abnormal marrow edema. Cord: Patchy signal abnormality within the right hemi cord at the  level of C2 (series 4, image 8), not definite seen on previous. Additional patchy signal abnormality within the central cord at the level of C2-3 (series 4, image 11), more prominent as compared to previous. Patchy signal abnormality within the central cord at the level of C3, slightly more prominent (series 4, image 15). Focal signal abnormality within the left dorsal cord at the level of C3-4 (series 4, image 17), more prominent from previous. Minimal patchy signal abnormality within the central cord at the level of C4 (series 4, image 19), more prominent. Probable additional subtle lesion within the right dorsal cord at the level of C6 (series 4, image 29), also more prominent. Findings consistent with history of demyelinating disease, and appear progressed from previous. No abnormal enhancement to suggest active demyelination. Posterior Fossa, vertebral arteries, paraspinal tissues: Craniocervical junction within normal limits. Paraspinous and prevertebral soft tissues are normal. Normal intravascular flow voids seen within the vertebral arteries bilaterally. Disc levels: C2-C3: Mild disc bulge with left-sided uncovertebral hypertrophy. No significant spinal stenosis. Mild left C3 foraminal narrowing. C3-C4: Mild disc bulge with left-sided uncovertebral hypertrophy. No significant spinal stenosis. Mild left C4 foraminal narrowing. C4-C5: Mild disc bulge with right-sided uncovertebral hypertrophy. Flattening of the ventral thecal sac without significant spinal stenosis. Moderate right C5 foraminal stenosis. C5-C6: Shallow right paracentral disc protrusion indents the right ventral thecal sac (  series 5, image 27). Lateral extension into the right neural foramen. Mild spinal stenosis without cord impingement. Moderate right with mild left C6 foraminal narrowing. C6-C7: Shallow broad-based central disc protrusion indents the ventral thecal sac. Mild spinal stenosis or cord impingement. Superimposed uncovertebral  hypertrophy with resultant mild left C7 foraminal stenosis. C7-T1:  Unremarkable. MRI THORACIC SPINE FINDINGS Alignment: Vertebral bodies normally aligned with preservation of the normal thoracic kyphosis. No listhesis or subluxation. Vertebrae: Vertebral body height maintained without evidence for acute or chronic fracture. Bone marrow signal intensity within normal limits. Few scattered benign hemangiomata noted, with small atypical hemangioma noted within the T5 vertebral body. No worrisome osseous lesions. No abnormal marrow edema or enhancement. Cord: Signal intensity within the thoracic spinal cord is within normal limits. No definite cord lesions to suggest demyelinating disease. Normal cord caliber and morphology. No abnormal enhancement. Paraspinal soft tissues: Paraspinous soft tissues within normal limits. Partially visualized lungs are clear. Visualized visceral structures unremarkable. Disc levels: No significant disc pathology seen within the thoracic spine. No disc bulge or focal disc protrusion. No significant facet disease. No canal or neural foraminal stenosis. No impingement. IMPRESSION: MRI HEAD IMPRESSION: 1. Patchy multifocal signal changes involving the supratentorial and infratentorial cerebral white matter as above, compatible with provided history of multiple sclerosis. Overall, changes are mildly progressed relative to previous MRI from 2019. No evidence for active demyelination. 2. No other acute intracranial abnormality. MRI CERVICAL SPINE IMPRESSION: 1. Patchy multifocal cord signal abnormality involving the cervical spinal cord as detailed above, consistent with history of multiple sclerosis. Overall, appearance appears mildly progressed relative to 2019. No evidence for active demyelination. 2. Degenerative spondylosis at C5-6 and C6-7 with resultant mild spinal stenosis, with moderate right C6 and mild left C7 foraminal narrowing. 3. Mild left C3 and C4 foraminal stenosis, with  moderate right C5 foraminal narrowing related to disc bulge and uncovertebral disease. MRI THORACIC SPINE IMPRESSION: Normal MRI of the thoracic spine and spinal cord. No evidence for demyelinating disease. No significant disc pathology, stenosis, or neural impingement. Electronically Signed   By: Rise Mu M.D.   On: 03/31/2019 01:02   Mr Cervical Spine W Or Wo Contrast  Result Date: 03/31/2019 CLINICAL DATA:  Initial evaluation for increasing leg weakness, spasms, blurry vision. History of multiple sclerosis. EXAM: MRI HEAD WITHOUT AND WITH CONTRAST MRI CERVICAL SPINE WITHOUT AND WITH CONTRAST MRI THORACIC SPINE WITHOUT AND WITH CONTRAST TECHNIQUE: Multiplanar, multiecho pulse sequences of the brain and surrounding structures, and cervical spine, to include the craniocervical junction and cervicothoracic junction, as well as the thoracic spine were obtained without and with intravenous contrast. CONTRAST:  9mL GADAVIST GADOBUTROL 1 MMOL/ML IV SOLN COMPARISON:  Previous MRI from 02/05/2018. FINDINGS: MRI HEAD FINDINGS Brain: Cerebral volume stable, and remains within normal limits for age. Multiple scattered patchy T2/FLAIR hyperintensities seen involving the periventricular, deep, and juxta cortical white matter of both cerebral hemispheres, consistent with provided history of multiple sclerosis. Scattered patchy involvement of the pons and medulla as well as the cerebellum present as well. In comparison with previous MRI, overall appearance has mildly progressed as evidence by a few scattered new and/or more prominent lesions as compared to previous. For example, few small juxta cortical lesions seen within the right parietal region appear to be new from previous (series 15, image 17). Few of the additional pre cystic lesions or also mildly increased in size. No abnormal restricted diffusion or enhancement to suggest active demyelination on today's exam. No evidence for acute  or subacute infarct.  Gray-white matter differentiation maintained. No encephalomalacia to suggest chronic cortical infarction. No foci of susceptibility artifact to suggest acute or chronic intracranial hemorrhage. No mass lesion, midline shift or mass effect. No hydrocephalus. No extra-axial fluid collection. Pituitary gland suprasellar region normal. Midline structures intact. Vascular: Major intracranial vascular flow voids are maintained. Skull and upper cervical spine: Craniocervical junction within normal limits. Bone marrow signal intensity normal. 19 mm sclerotic lesion at the right frontal scalp noted, indeterminate, but stable from previous, and of doubtful significance. Scalp soft tissues demonstrate no acute finding. Sinuses/Orbits: Globes and orbital soft tissues within normal limits. Paranasal sinuses are clear. No mastoid effusion. Inner ear structures normal. Other: None. MRI CERVICAL SPINE FINDINGS Alignment: Straightening of the normal cervical lordosis. No listhesis. Vertebrae: Vertebral body height maintained without evidence for acute or chronic fracture. Bone marrow signal intensity within normal limits. Benign hemangioma noted within the C7 vertebral body. No other discrete or worrisome osseous lesions. No abnormal marrow edema. Cord: Patchy signal abnormality within the right hemi cord at the level of C2 (series 4, image 8), not definite seen on previous. Additional patchy signal abnormality within the central cord at the level of C2-3 (series 4, image 11), more prominent as compared to previous. Patchy signal abnormality within the central cord at the level of C3, slightly more prominent (series 4, image 15). Focal signal abnormality within the left dorsal cord at the level of C3-4 (series 4, image 17), more prominent from previous. Minimal patchy signal abnormality within the central cord at the level of C4 (series 4, image 19), more prominent. Probable additional subtle lesion within the right dorsal cord at  the level of C6 (series 4, image 29), also more prominent. Findings consistent with history of demyelinating disease, and appear progressed from previous. No abnormal enhancement to suggest active demyelination. Posterior Fossa, vertebral arteries, paraspinal tissues: Craniocervical junction within normal limits. Paraspinous and prevertebral soft tissues are normal. Normal intravascular flow voids seen within the vertebral arteries bilaterally. Disc levels: C2-C3: Mild disc bulge with left-sided uncovertebral hypertrophy. No significant spinal stenosis. Mild left C3 foraminal narrowing. C3-C4: Mild disc bulge with left-sided uncovertebral hypertrophy. No significant spinal stenosis. Mild left C4 foraminal narrowing. C4-C5: Mild disc bulge with right-sided uncovertebral hypertrophy. Flattening of the ventral thecal sac without significant spinal stenosis. Moderate right C5 foraminal stenosis. C5-C6: Shallow right paracentral disc protrusion indents the right ventral thecal sac (series 5, image 27). Lateral extension into the right neural foramen. Mild spinal stenosis without cord impingement. Moderate right with mild left C6 foraminal narrowing. C6-C7: Shallow broad-based central disc protrusion indents the ventral thecal sac. Mild spinal stenosis or cord impingement. Superimposed uncovertebral hypertrophy with resultant mild left C7 foraminal stenosis. C7-T1:  Unremarkable. MRI THORACIC SPINE FINDINGS Alignment: Vertebral bodies normally aligned with preservation of the normal thoracic kyphosis. No listhesis or subluxation. Vertebrae: Vertebral body height maintained without evidence for acute or chronic fracture. Bone marrow signal intensity within normal limits. Few scattered benign hemangiomata noted, with small atypical hemangioma noted within the T5 vertebral body. No worrisome osseous lesions. No abnormal marrow edema or enhancement. Cord: Signal intensity within the thoracic spinal cord is within normal  limits. No definite cord lesions to suggest demyelinating disease. Normal cord caliber and morphology. No abnormal enhancement. Paraspinal soft tissues: Paraspinous soft tissues within normal limits. Partially visualized lungs are clear. Visualized visceral structures unremarkable. Disc levels: No significant disc pathology seen within the thoracic spine. No disc bulge or focal disc protrusion.  No significant facet disease. No canal or neural foraminal stenosis. No impingement. IMPRESSION: MRI HEAD IMPRESSION: 1. Patchy multifocal signal changes involving the supratentorial and infratentorial cerebral white matter as above, compatible with provided history of multiple sclerosis. Overall, changes are mildly progressed relative to previous MRI from 2019. No evidence for active demyelination. 2. No other acute intracranial abnormality. MRI CERVICAL SPINE IMPRESSION: 1. Patchy multifocal cord signal abnormality involving the cervical spinal cord as detailed above, consistent with history of multiple sclerosis. Overall, appearance appears mildly progressed relative to 2019. No evidence for active demyelination. 2. Degenerative spondylosis at C5-6 and C6-7 with resultant mild spinal stenosis, with moderate right C6 and mild left C7 foraminal narrowing. 3. Mild left C3 and C4 foraminal stenosis, with moderate right C5 foraminal narrowing related to disc bulge and uncovertebral disease. MRI THORACIC SPINE IMPRESSION: Normal MRI of the thoracic spine and spinal cord. No evidence for demyelinating disease. No significant disc pathology, stenosis, or neural impingement. Electronically Signed   By: Rise Mu M.D.   On: 03/31/2019 01:02   Mr Thoracic Spine W Wo Contrast  Result Date: 03/31/2019 CLINICAL DATA:  Initial evaluation for increasing leg weakness, spasms, blurry vision. History of multiple sclerosis. EXAM: MRI HEAD WITHOUT AND WITH CONTRAST MRI CERVICAL SPINE WITHOUT AND WITH CONTRAST MRI THORACIC SPINE  WITHOUT AND WITH CONTRAST TECHNIQUE: Multiplanar, multiecho pulse sequences of the brain and surrounding structures, and cervical spine, to include the craniocervical junction and cervicothoracic junction, as well as the thoracic spine were obtained without and with intravenous contrast. CONTRAST:  9mL GADAVIST GADOBUTROL 1 MMOL/ML IV SOLN COMPARISON:  Previous MRI from 02/05/2018. FINDINGS: MRI HEAD FINDINGS Brain: Cerebral volume stable, and remains within normal limits for age. Multiple scattered patchy T2/FLAIR hyperintensities seen involving the periventricular, deep, and juxta cortical white matter of both cerebral hemispheres, consistent with provided history of multiple sclerosis. Scattered patchy involvement of the pons and medulla as well as the cerebellum present as well. In comparison with previous MRI, overall appearance has mildly progressed as evidence by a few scattered new and/or more prominent lesions as compared to previous. For example, few small juxta cortical lesions seen within the right parietal region appear to be new from previous (series 15, image 17). Few of the additional pre cystic lesions or also mildly increased in size. No abnormal restricted diffusion or enhancement to suggest active demyelination on today's exam. No evidence for acute or subacute infarct. Gray-white matter differentiation maintained. No encephalomalacia to suggest chronic cortical infarction. No foci of susceptibility artifact to suggest acute or chronic intracranial hemorrhage. No mass lesion, midline shift or mass effect. No hydrocephalus. No extra-axial fluid collection. Pituitary gland suprasellar region normal. Midline structures intact. Vascular: Major intracranial vascular flow voids are maintained. Skull and upper cervical spine: Craniocervical junction within normal limits. Bone marrow signal intensity normal. 19 mm sclerotic lesion at the right frontal scalp noted, indeterminate, but stable from previous,  and of doubtful significance. Scalp soft tissues demonstrate no acute finding. Sinuses/Orbits: Globes and orbital soft tissues within normal limits. Paranasal sinuses are clear. No mastoid effusion. Inner ear structures normal. Other: None. MRI CERVICAL SPINE FINDINGS Alignment: Straightening of the normal cervical lordosis. No listhesis. Vertebrae: Vertebral body height maintained without evidence for acute or chronic fracture. Bone marrow signal intensity within normal limits. Benign hemangioma noted within the C7 vertebral body. No other discrete or worrisome osseous lesions. No abnormal marrow edema. Cord: Patchy signal abnormality within the right hemi cord at the level of C2 (  series 4, image 8), not definite seen on previous. Additional patchy signal abnormality within the central cord at the level of C2-3 (series 4, image 11), more prominent as compared to previous. Patchy signal abnormality within the central cord at the level of C3, slightly more prominent (series 4, image 15). Focal signal abnormality within the left dorsal cord at the level of C3-4 (series 4, image 17), more prominent from previous. Minimal patchy signal abnormality within the central cord at the level of C4 (series 4, image 19), more prominent. Probable additional subtle lesion within the right dorsal cord at the level of C6 (series 4, image 29), also more prominent. Findings consistent with history of demyelinating disease, and appear progressed from previous. No abnormal enhancement to suggest active demyelination. Posterior Fossa, vertebral arteries, paraspinal tissues: Craniocervical junction within normal limits. Paraspinous and prevertebral soft tissues are normal. Normal intravascular flow voids seen within the vertebral arteries bilaterally. Disc levels: C2-C3: Mild disc bulge with left-sided uncovertebral hypertrophy. No significant spinal stenosis. Mild left C3 foraminal narrowing. C3-C4: Mild disc bulge with left-sided  uncovertebral hypertrophy. No significant spinal stenosis. Mild left C4 foraminal narrowing. C4-C5: Mild disc bulge with right-sided uncovertebral hypertrophy. Flattening of the ventral thecal sac without significant spinal stenosis. Moderate right C5 foraminal stenosis. C5-C6: Shallow right paracentral disc protrusion indents the right ventral thecal sac (series 5, image 27). Lateral extension into the right neural foramen. Mild spinal stenosis without cord impingement. Moderate right with mild left C6 foraminal narrowing. C6-C7: Shallow broad-based central disc protrusion indents the ventral thecal sac. Mild spinal stenosis or cord impingement. Superimposed uncovertebral hypertrophy with resultant mild left C7 foraminal stenosis. C7-T1:  Unremarkable. MRI THORACIC SPINE FINDINGS Alignment: Vertebral bodies normally aligned with preservation of the normal thoracic kyphosis. No listhesis or subluxation. Vertebrae: Vertebral body height maintained without evidence for acute or chronic fracture. Bone marrow signal intensity within normal limits. Few scattered benign hemangiomata noted, with small atypical hemangioma noted within the T5 vertebral body. No worrisome osseous lesions. No abnormal marrow edema or enhancement. Cord: Signal intensity within the thoracic spinal cord is within normal limits. No definite cord lesions to suggest demyelinating disease. Normal cord caliber and morphology. No abnormal enhancement. Paraspinal soft tissues: Paraspinous soft tissues within normal limits. Partially visualized lungs are clear. Visualized visceral structures unremarkable. Disc levels: No significant disc pathology seen within the thoracic spine. No disc bulge or focal disc protrusion. No significant facet disease. No canal or neural foraminal stenosis. No impingement. IMPRESSION: MRI HEAD IMPRESSION: 1. Patchy multifocal signal changes involving the supratentorial and infratentorial cerebral white matter as above,  compatible with provided history of multiple sclerosis. Overall, changes are mildly progressed relative to previous MRI from 2019. No evidence for active demyelination. 2. No other acute intracranial abnormality. MRI CERVICAL SPINE IMPRESSION: 1. Patchy multifocal cord signal abnormality involving the cervical spinal cord as detailed above, consistent with history of multiple sclerosis. Overall, appearance appears mildly progressed relative to 2019. No evidence for active demyelination. 2. Degenerative spondylosis at C5-6 and C6-7 with resultant mild spinal stenosis, with moderate right C6 and mild left C7 foraminal narrowing. 3. Mild left C3 and C4 foraminal stenosis, with moderate right C5 foraminal narrowing related to disc bulge and uncovertebral disease. MRI THORACIC SPINE IMPRESSION: Normal MRI of the thoracic spine and spinal cord. No evidence for demyelinating disease. No significant disc pathology, stenosis, or neural impingement. Electronically Signed   By: Rise Mu M.D.   On: 03/31/2019 01:02    Review of  Systems  Constitutional: Positive for malaise/fatigue.  Musculoskeletal: Positive for back pain and myalgias.  Neurological: Positive for weakness.   Blood pressure 125/76, pulse 80, temperature 98.4 F (36.9 C), temperature source Oral, resp. rate 14, SpO2 97 %. Physical Exam  Constitutional: She is oriented to person, place, and time. She appears well-nourished. She is cooperative.  Respiratory: Effort normal.  GI: Normal appearance.    Normal, incision appears normal, nontender, healing well  Musculoskeletal:     Comments: Pain and increased spasticity with ROM of both upper and lower extremities, L>R, weakness in left leg at baseline  Neurological: She is alert and oriented to person, place, and time.    Assessment/Plan: The patients pump was interrogated and found to be running Baclofen 500.0 mcg/ml at a rate of 379.61 mcg/day with an expected residual volume of  22.5 ml.  The pump was then reprogrammed to run at a 10% decreased rate of 341.24 mcg/day.  Her expected fill date is 04/30/2019.  It was explained to the patient and her husband what was done.  While they were not happy with the decision they agreed to it.  They are aware they will need to call the office when she is discharged to set up an appointment to evaluate the pump again.    The patient feels her symptoms are the same as past MS flare ups.  She is hoping to get IC steroids at some point as she has always found that to be helpful.  I will reach out to Dr. Bonnita Hollow office to notify them of her pump change.    Kae Heller PA-C 03/31/2019, 10:23 AM

## 2019-03-31 NOTE — ED Provider Notes (Signed)
Case discussed with Dr. Cheral Marker.  Recommends admission to medicine.  Weakness may be 2/2 receiving too much baclofen from spinal pump vs MS flare.  Hold IV steroids for now per Dr. Yvetta Coder note.  Appreciate Dr. Criss Alvine for bringing patient into the hospital.    Montine Circle, PA-C 03/31/19 0300    Veryl Speak, MD 03/31/19 [phone]

## 2019-03-31 NOTE — ED Notes (Signed)
Dr. Lovenia Shuck for [facility] manages her baclofin pump and Clarise Cruz reduced the dose

## 2019-03-31 NOTE — Progress Notes (Signed)
Patient seen this morning by Dr. Cheral Marker. Reached out to Dr. Felecia Shelling regarding Solu-Medrol. He feels that although imaging does not reveal gadolinium enhancement, MS patients with history of gait disturbance like her, feel better with a couple of doses of high-dose steroids and he would be okay with [place] doing 2 days of IV Solu-Medrol.  Discussed with Dr. Clementeen Graham and ordered 1g IV Solu-Medrol daily x2 doses.  We will follow with you.  -- Amie Portland, MD [facility] Pager: [phone] If 7pm to 7am, please call on call as listed on [facility].

## 2019-03-31 NOTE — ED Notes (Signed)
Pt updated on POC.

## 2019-03-31 NOTE — H&P (Signed)
History and Physical    Sharon Odom ZOX:[id number] DOB: 1970-12-31 DOA: 03/30/2019  PCP: Annita Brod, MD    Patient coming from: Home    Chief Complaint:  Increased pain in both extremity & lower extremity, associated with weakness some blurry vision.  She has voice hoarseness and she was very lethargic   HPI: Sharon Odom is a 48 y.o. female with medical history significant of multiple sclerosis, GERD, asthma, constipation, depression.,  She has muscle spasms at baseline and uses a walker. She had a baclofen pump placed 5 weeks ago, with increasing doses of baclofen qweek since then. She states that despite this, her symptoms have worsened to the point that she is struggling to walk. She went to an Ophthalmologist for the blurred vision and states that the Ophthalmologist told her that her eye exam was negative.   tED Course:  MRI of brain, cervical and thoracic spine with and without contrast obtained in the ED revealed multiple chronic demyelinating lesions without acute enhancement Emergency room physician consult Dr. Otelia Limes in consult   He feels that the symptom of the patient is more related to high dose of baclofen pump. Is suggested to call neurosurgery in the morning to risk set the pump  Review of Systems: As per HPI otherwise 10 point review of systems negative.  Muscle aches, back pain extremities pain with some weakness muscle spasm Difficulty swallowing Denies change in mental status Denies shortness of breath, Denies chest pain Denies abdominal pain vomiting diarrhea No flank pain or urinary symptoms Musculoskeletal diffuse muscle pain, muscle spasm Neurologic patient lethargic after sedation  Past Medical History:  Diagnosis Date   Allergies    Anxiety    Arthritis    Asthma    Constipation    Depression    GERD (gastroesophageal reflux disease)    Headache    History of hiatal hernia    Multiple sclerosis (HCC)    Vision abnormalities      Wears glasses     Past Surgical History:  Procedure Laterality Date   EXCISION MORTON'S NEUROMA Right    fallopian tube removal     INTRATHECAL PUMP IMPLANT Right 02/18/2019   Procedure: INTRATHECAL PUMP IMPLANT;  Surgeon: Odette Fraction, MD;  Location: [facility] OR;  Service: Neurosurgery;  Laterality: Right;  INTRATHECAL PUMP IMPLANT   WISDOM TOOTH EXTRACTION       reports that she has been smoking cigarettes. She has been smoking about 0.50 packs per day. She has never used smokeless tobacco. She reports current alcohol use. She reports that she does not use drugs.  Allergies  Allergen Reactions   Morphine Other (See Comments)    Family History  Problem Relation Age of Onset   Diabetes Mother    Healthy Brother      Prior to Admission medications   Medication Sig Start Date End Date Taking? Authorizing Provider  acetaminophen (TYLENOL) 650 MG CR tablet Take 1,300 mg by mouth every 8 (eight) hours as needed for pain.    Yes [provider]  albuterol (PROVENTIL) (2.5 MG/3ML) 0.083% nebulizer solution Take 2.5 mg by nebulization every 6 (six) hours as needed for wheezing or shortness of breath.   Yes [provider]  albuterol (VENTOLIN HFA) 108 (90 Base) MCG/ACT inhaler Inhale 1-2 puffs into the lungs every 6 (six) hours as needed for wheezing or shortness of breath.    Yes [provider]  Alemtuzumab (LEMTRADA) 12 MG/1.2ML SOLN Inject 12 mg into the vein See admin  instructions. Once a year   Yes [provider]  baclofen (LIORESAL) 20 MG tablet 1 PO Q6 hours PRN for breakthrough spasms Patient taking differently: Take 20 mg by mouth 4 (four) times daily.  02/18/19  Yes Odette Fraction, MD  botulinum toxin Type A (BOTOX) 100 units SOLR injection Inject 600 Units into the muscle every 3 (three) months.   Yes [provider]  buPROPion (WELLBUTRIN XL) 300 MG 24 hr tablet Take 300 mg by mouth daily.    Yes [provider]   Cholecalciferol (VITAMIN D-3) 5000 units TABS Take 5,000 Units by mouth daily.    Yes [provider]  Cyanocobalamin (VITAMIN B-12) 2500 MCG SUBL Place 2,500 mcg under the tongue daily.    Yes [provider]  dalfampridine 10 MG TB12 One po bid Patient taking differently: Take 10 mg by mouth 2 (two) times daily. One po bid 09/18/18  Yes Sater, [name], MD  diazepam (VALIUM) 5 MG tablet Take up to 3 pills a day for spasticity Patient taking differently: Take 5-10 mg by mouth See admin instructions. Take up to 3 pills a day for spasticity 12/15/18  Yes Sater, [name], MD  DULoxetine (CYMBALTA) 30 MG capsule Take 1 capsule (30 mg total) by mouth 2 (two) times daily. 03/30/19  Yes Sater, [name], MD  ibuprofen (ADVIL,MOTRIN) 200 MG tablet Take 400 mg by mouth every 8 (eight) hours as needed for moderate pain.    Yes [provider]  montelukast (SINGULAIR) 10 MG tablet Take 10 mg by mouth daily.    Yes [provider]  ondansetron (ZOFRAN) 8 MG tablet as needed With infusions Patient taking differently: Take 8 mg by mouth every 8 (eight) hours as needed for nausea or vomiting. as needed With infusions 02/24/18  Yes Sater, [name], MD  oxybutynin (DITROPAN-XL) 10 MG 24 hr tablet Take 10 mg by mouth daily. 02/11/19  Yes [provider]  oxyCODONE-acetaminophen (PERCOCET) 10-325 MG tablet Take 1 tablet by mouth 2 (two) times daily as needed for pain. 03/19/19  Yes Sater, [name], MD  ranitidine (ZANTAC) 300 MG tablet Take 300 mg by mouth daily as needed for heartburn.   Yes [provider]  cyclobenzaprine (FLEXERIL) 10 MG tablet Take 1 tablet (10 mg total) by mouth 3 (three) times daily. Patient not taking: Reported on 03/31/2019 10/23/18   Asa Lente, MD    Physical Exam: Vitals:   03/31/19 0330 03/31/19 0345 03/31/19 0400 03/31/19 0415  BP: 129/66 119/74 (!) 114/92 123/64  Pulse: 95 73  77  Resp: (!) 24 20 14 13   Temp:       TempSrc:      SpO2: 96% 94%  91%    Constitutional: NAD, calm, comfortable Vitals:   03/31/19 0330 03/31/19 0345 03/31/19 0400 03/31/19 0415  BP: 129/66 119/74 (!) 114/92 123/64  Pulse: 95 73  77  Resp: (!) 24 20 14 13   Temp:      TempSrc:      SpO2: 96% 94%  91%   Eyes: PERRL, lids and conjunctivae normal ENMT: Mucous membranes are moist. Posterior pharynx clear of any exudate or lesions.Normal dentition.  Neck: normal, supple, no masses, no thyromegaly Respiratory: clear to auscultation bilaterally, no wheezing, no crackles. Normal respiratory effort. No accessory muscle use.  Cardiovascular: Regular rate and rhythm, no murmurs / rubs / gallops. No extremity edema. 2+ pedal pulses. No carotid bruits.  Abdomen: no tenderness, no masses palpated. No  hepatosplenomegaly. Bowel sounds positive.  Musculoskeletal: no clubbing / cyanosis. No joint deformity upper and lower extremities. Good ROM, no contractures. Normal muscle tone.  Skin: no rashes, lesions, ulcers. No induration Neurologic: Visual field intact, no proptosis, normal voice left upper extremity with 3-5 strength proximally unable to extend digits, left lower extremity unable to tell about the above bed with mild increased tone, right lower extremity 3-5 right upper extremity mild increased tone with bradykinesia strength 4-5 Decreased sensory to upper extremities bilaterally Examined by neurologist.  At my evaluation was very sleepy after receiving Dilaudid Psychiatric: Normal judgment and insight. Alert and oriented x 3.  Depressed   Labs on Admission: I have personally reviewed following labs and imaging studies  CBC: Recent Labs  Lab 03/30/19 1803  WBC 11.0*  HGB 15.5*  HCT 48.9*  MCV 102.5*  PLT 271   Basic Metabolic Panel: Recent Labs  Lab 03/30/19 1803  NA 136  K 4.0  CL 101  CO2 24  GLUCOSE 99  BUN 13  CREATININE 0.86  CALCIUM 9.6   GFR: CrCl cannot be calculated (Unknown ideal weight.). Liver  Function Tests: No results for input(s): AST, ALT, ALKPHOS, BILITOT, PROT, ALBUMIN in the last 168 hours. No results for input(s): LIPASE, AMYLASE in the last 168 hours. No results for input(s): AMMONIA in the last 168 hours. Coagulation Profile: No results for input(s): INR, PROTIME in the last 168 hours. Cardiac Enzymes: No results for input(s): CKTOTAL, CKMB, CKMBINDEX, TROPONINI in the last 168 hours. BNP (last 3 results) No results for input(s): PROBNP in the last 8760 hours. HbA1C: No results for input(s): HGBA1C in the last 72 hours. CBG: No results for input(s): GLUCAP in the last 168 hours. Lipid Profile: No results for input(s): CHOL, HDL, LDLCALC, TRIG, CHOLHDL, LDLDIRECT in the last 72 hours. Thyroid Function Tests: No results for input(s): TSH, T4TOTAL, FREET4, T3FREE, THYROIDAB in the last 72 hours. Anemia Panel: No results for input(s): VITAMINB12, FOLATE, FERRITIN, TIBC, IRON, RETICCTPCT in the last 72 hours. Urine analysis:    Component Value Date/Time   APPEARANCEUR Clear 04/14/2018 0841   GLUCOSEU Negative 04/14/2018 0841   BILIRUBINUR Negative 04/14/2018 0841   PROTEINUR Negative 04/14/2018 0841   NITRITE Negative 04/14/2018 0841   LEUKOCYTESUR Negative 04/14/2018 0841    Radiological Exams on Admission: Mr Brain W And Wo Contrast  Result Date: 03/31/2019 CLINICAL DATA:  Initial evaluation for increasing leg weakness, spasms, blurry vision. History of multiple sclerosis. EXAM: MRI HEAD WITHOUT AND WITH CONTRAST MRI CERVICAL SPINE WITHOUT AND WITH CONTRAST MRI THORACIC SPINE WITHOUT AND WITH CONTRAST TECHNIQUE: Multiplanar, multiecho pulse sequences of the brain and surrounding structures, and cervical spine, to include the craniocervical junction and cervicothoracic junction, as well as the thoracic spine were obtained without and with intravenous contrast. CONTRAST:  9mL GADAVIST GADOBUTROL 1 MMOL/ML IV SOLN COMPARISON:  Previous MRI from 02/05/2018. FINDINGS:  MRI HEAD FINDINGS Brain: Cerebral volume stable, and remains within normal limits for age. Multiple scattered patchy T2/FLAIR hyperintensities seen involving the periventricular, deep, and juxta cortical white matter of both cerebral hemispheres, consistent with provided history of multiple sclerosis. Scattered patchy involvement of the pons and medulla as well as the cerebellum present as well. In comparison with previous MRI, overall appearance has mildly progressed as evidence by a few scattered new and/or more prominent lesions as compared to previous. For example, few small juxta cortical lesions seen within the right parietal region appear to be new from previous (series 15, image  17). Few of the additional pre cystic lesions or also mildly increased in size. No abnormal restricted diffusion or enhancement to suggest active demyelination on today's exam. No evidence for acute or subacute infarct. Gray-white matter differentiation maintained. No encephalomalacia to suggest chronic cortical infarction. No foci of susceptibility artifact to suggest acute or chronic intracranial hemorrhage. No mass lesion, midline shift or mass effect. No hydrocephalus. No extra-axial fluid collection. Pituitary gland suprasellar region normal. Midline structures intact. Vascular: Major intracranial vascular flow voids are maintained. Skull and upper cervical spine: Craniocervical junction within normal limits. Bone marrow signal intensity normal. 19 mm sclerotic lesion at the right frontal scalp noted, indeterminate, but stable from previous, and of doubtful significance. Scalp soft tissues demonstrate no acute finding. Sinuses/Orbits: Globes and orbital soft tissues within normal limits. Paranasal sinuses are clear. No mastoid effusion. Inner ear structures normal. Other: None. MRI CERVICAL SPINE FINDINGS Alignment: Straightening of the normal cervical lordosis. No listhesis. Vertebrae: Vertebral body height maintained without  evidence for acute or chronic fracture. Bone marrow signal intensity within normal limits. Benign hemangioma noted within the C7 vertebral body. No other discrete or worrisome osseous lesions. No abnormal marrow edema. Cord: Patchy signal abnormality within the right hemi cord at the level of C2 (series 4, image 8), not definite seen on previous. Additional patchy signal abnormality within the central cord at the level of C2-3 (series 4, image 11), more prominent as compared to previous. Patchy signal abnormality within the central cord at the level of C3, slightly more prominent (series 4, image 15). Focal signal abnormality within the left dorsal cord at the level of C3-4 (series 4, image 17), more prominent from previous. Minimal patchy signal abnormality within the central cord at the level of C4 (series 4, image 19), more prominent. Probable additional subtle lesion within the right dorsal cord at the level of C6 (series 4, image 29), also more prominent. Findings consistent with history of demyelinating disease, and appear progressed from previous. No abnormal enhancement to suggest active demyelination. Posterior Fossa, vertebral arteries, paraspinal tissues: Craniocervical junction within normal limits. Paraspinous and prevertebral soft tissues are normal. Normal intravascular flow voids seen within the vertebral arteries bilaterally. Disc levels: C2-C3: Mild disc bulge with left-sided uncovertebral hypertrophy. No significant spinal stenosis. Mild left C3 foraminal narrowing. C3-C4: Mild disc bulge with left-sided uncovertebral hypertrophy. No significant spinal stenosis. Mild left C4 foraminal narrowing. C4-C5: Mild disc bulge with right-sided uncovertebral hypertrophy. Flattening of the ventral thecal sac without significant spinal stenosis. Moderate right C5 foraminal stenosis. C5-C6: Shallow right paracentral disc protrusion indents the right ventral thecal sac (series 5, image 27). Lateral extension  into the right neural foramen. Mild spinal stenosis without cord impingement. Moderate right with mild left C6 foraminal narrowing. C6-C7: Shallow broad-based central disc protrusion indents the ventral thecal sac. Mild spinal stenosis or cord impingement. Superimposed uncovertebral hypertrophy with resultant mild left C7 foraminal stenosis. C7-T1:  Unremarkable. MRI THORACIC SPINE FINDINGS Alignment: Vertebral bodies normally aligned with preservation of the normal thoracic kyphosis. No listhesis or subluxation. Vertebrae: Vertebral body height maintained without evidence for acute or chronic fracture. Bone marrow signal intensity within normal limits. Few scattered benign hemangiomata noted, with small atypical hemangioma noted within the T5 vertebral body. No worrisome osseous lesions. No abnormal marrow edema or enhancement. Cord: Signal intensity within the thoracic spinal cord is within normal limits. No definite cord lesions to suggest demyelinating disease. Normal cord caliber and morphology. No abnormal enhancement. Paraspinal soft tissues: Paraspinous soft tissues  within normal limits. Partially visualized lungs are clear. Visualized visceral structures unremarkable. Disc levels: No significant disc pathology seen within the thoracic spine. No disc bulge or focal disc protrusion. No significant facet disease. No canal or neural foraminal stenosis. No impingement. IMPRESSION: MRI HEAD IMPRESSION: 1. Patchy multifocal signal changes involving the supratentorial and infratentorial cerebral white matter as above, compatible with provided history of multiple sclerosis. Overall, changes are mildly progressed relative to previous MRI from 2019. No evidence for active demyelination. 2. No other acute intracranial abnormality. MRI CERVICAL SPINE IMPRESSION: 1. Patchy multifocal cord signal abnormality involving the cervical spinal cord as detailed above, consistent with history of multiple sclerosis. Overall,  appearance appears mildly progressed relative to 2019. No evidence for active demyelination. 2. Degenerative spondylosis at C5-6 and C6-7 with resultant mild spinal stenosis, with moderate right C6 and mild left C7 foraminal narrowing. 3. Mild left C3 and C4 foraminal stenosis, with moderate right C5 foraminal narrowing related to disc bulge and uncovertebral disease. MRI THORACIC SPINE IMPRESSION: Normal MRI of the thoracic spine and spinal cord. No evidence for demyelinating disease. No significant disc pathology, stenosis, or neural impingement. Electronically Signed   By: Jeannine Boga M.D.   On: 03/31/2019 01:02   Mr Cervical Spine W Or Wo Contrast  Result Date: 03/31/2019 CLINICAL DATA:  Initial evaluation for increasing leg weakness, spasms, blurry vision. History of multiple sclerosis. EXAM: MRI HEAD WITHOUT AND WITH CONTRAST MRI CERVICAL SPINE WITHOUT AND WITH CONTRAST MRI THORACIC SPINE WITHOUT AND WITH CONTRAST TECHNIQUE: Multiplanar, multiecho pulse sequences of the brain and surrounding structures, and cervical spine, to include the craniocervical junction and cervicothoracic junction, as well as the thoracic spine were obtained without and with intravenous contrast. CONTRAST:  36mL GADAVIST GADOBUTROL 1 MMOL/ML IV SOLN COMPARISON:  Previous MRI from 02/05/2018. FINDINGS: MRI HEAD FINDINGS Brain: Cerebral volume stable, and remains within normal limits for age. Multiple scattered patchy T2/FLAIR hyperintensities seen involving the periventricular, deep, and juxta cortical white matter of both cerebral hemispheres, consistent with provided history of multiple sclerosis. Scattered patchy involvement of the pons and medulla as well as the cerebellum present as well. In comparison with previous MRI, overall appearance has mildly progressed as evidence by a few scattered new and/or more prominent lesions as compared to previous. For example, few small juxta cortical lesions seen within the right  parietal region appear to be new from previous (series 15, image 17). Few of the additional pre cystic lesions or also mildly increased in size. No abnormal restricted diffusion or enhancement to suggest active demyelination on today's exam. No evidence for acute or subacute infarct. Gray-white matter differentiation maintained. No encephalomalacia to suggest chronic cortical infarction. No foci of susceptibility artifact to suggest acute or chronic intracranial hemorrhage. No mass lesion, midline shift or mass effect. No hydrocephalus. No extra-axial fluid collection. Pituitary gland suprasellar region normal. Midline structures intact. Vascular: Major intracranial vascular flow voids are maintained. Skull and upper cervical spine: Craniocervical junction within normal limits. Bone marrow signal intensity normal. 19 mm sclerotic lesion at the right frontal scalp noted, indeterminate, but stable from previous, and of doubtful significance. Scalp soft tissues demonstrate no acute finding. Sinuses/Orbits: Globes and orbital soft tissues within normal limits. Paranasal sinuses are clear. No mastoid effusion. Inner ear structures normal. Other: None. MRI CERVICAL SPINE FINDINGS Alignment: Straightening of the normal cervical lordosis. No listhesis. Vertebrae: Vertebral body height maintained without evidence for acute or chronic fracture. Bone marrow signal intensity within normal limits. Benign hemangioma  noted within the C7 vertebral body. No other discrete or worrisome osseous lesions. No abnormal marrow edema. Cord: Patchy signal abnormality within the right hemi cord at the level of C2 (series 4, image 8), not definite seen on previous. Additional patchy signal abnormality within the central cord at the level of C2-3 (series 4, image 11), more prominent as compared to previous. Patchy signal abnormality within the central cord at the level of C3, slightly more prominent (series 4, image 15). Focal signal  abnormality within the left dorsal cord at the level of C3-4 (series 4, image 17), more prominent from previous. Minimal patchy signal abnormality within the central cord at the level of C4 (series 4, image 19), more prominent. Probable additional subtle lesion within the right dorsal cord at the level of C6 (series 4, image 29), also more prominent. Findings consistent with history of demyelinating disease, and appear progressed from previous. No abnormal enhancement to suggest active demyelination. Posterior Fossa, vertebral arteries, paraspinal tissues: Craniocervical junction within normal limits. Paraspinous and prevertebral soft tissues are normal. Normal intravascular flow voids seen within the vertebral arteries bilaterally. Disc levels: C2-C3: Mild disc bulge with left-sided uncovertebral hypertrophy. No significant spinal stenosis. Mild left C3 foraminal narrowing. C3-C4: Mild disc bulge with left-sided uncovertebral hypertrophy. No significant spinal stenosis. Mild left C4 foraminal narrowing. C4-C5: Mild disc bulge with right-sided uncovertebral hypertrophy. Flattening of the ventral thecal sac without significant spinal stenosis. Moderate right C5 foraminal stenosis. C5-C6: Shallow right paracentral disc protrusion indents the right ventral thecal sac (series 5, image 27). Lateral extension into the right neural foramen. Mild spinal stenosis without cord impingement. Moderate right with mild left C6 foraminal narrowing. C6-C7: Shallow broad-based central disc protrusion indents the ventral thecal sac. Mild spinal stenosis or cord impingement. Superimposed uncovertebral hypertrophy with resultant mild left C7 foraminal stenosis. C7-T1:  Unremarkable. MRI THORACIC SPINE FINDINGS Alignment: Vertebral bodies normally aligned with preservation of the normal thoracic kyphosis. No listhesis or subluxation. Vertebrae: Vertebral body height maintained without evidence for acute or chronic fracture. Bone marrow  signal intensity within normal limits. Few scattered benign hemangiomata noted, with small atypical hemangioma noted within the T5 vertebral body. No worrisome osseous lesions. No abnormal marrow edema or enhancement. Cord: Signal intensity within the thoracic spinal cord is within normal limits. No definite cord lesions to suggest demyelinating disease. Normal cord caliber and morphology. No abnormal enhancement. Paraspinal soft tissues: Paraspinous soft tissues within normal limits. Partially visualized lungs are clear. Visualized visceral structures unremarkable. Disc levels: No significant disc pathology seen within the thoracic spine. No disc bulge or focal disc protrusion. No significant facet disease. No canal or neural foraminal stenosis. No impingement. IMPRESSION: MRI HEAD IMPRESSION: 1. Patchy multifocal signal changes involving the supratentorial and infratentorial cerebral white matter as above, compatible with provided history of multiple sclerosis. Overall, changes are mildly progressed relative to previous MRI from 2019. No evidence for active demyelination. 2. No other acute intracranial abnormality. MRI CERVICAL SPINE IMPRESSION: 1. Patchy multifocal cord signal abnormality involving the cervical spinal cord as detailed above, consistent with history of multiple sclerosis. Overall, appearance appears mildly progressed relative to 2019. No evidence for active demyelination. 2. Degenerative spondylosis at C5-6 and C6-7 with resultant mild spinal stenosis, with moderate right C6 and mild left C7 foraminal narrowing. 3. Mild left C3 and C4 foraminal stenosis, with moderate right C5 foraminal narrowing related to disc bulge and uncovertebral disease. MRI THORACIC SPINE IMPRESSION: Normal MRI of the thoracic spine and spinal cord. No  evidence for demyelinating disease. No significant disc pathology, stenosis, or neural impingement. Electronically Signed   By: Rise Mu M.D.   On: 03/31/2019  01:02   Mr Thoracic Spine W Wo Contrast  Result Date: 03/31/2019 CLINICAL DATA:  Initial evaluation for increasing leg weakness, spasms, blurry vision. History of multiple sclerosis. EXAM: MRI HEAD WITHOUT AND WITH CONTRAST MRI CERVICAL SPINE WITHOUT AND WITH CONTRAST MRI THORACIC SPINE WITHOUT AND WITH CONTRAST TECHNIQUE: Multiplanar, multiecho pulse sequences of the brain and surrounding structures, and cervical spine, to include the craniocervical junction and cervicothoracic junction, as well as the thoracic spine were obtained without and with intravenous contrast. CONTRAST:  9mL GADAVIST GADOBUTROL 1 MMOL/ML IV SOLN COMPARISON:  Previous MRI from 02/05/2018. FINDINGS: MRI HEAD FINDINGS Brain: Cerebral volume stable, and remains within normal limits for age. Multiple scattered patchy T2/FLAIR hyperintensities seen involving the periventricular, deep, and juxta cortical white matter of both cerebral hemispheres, consistent with provided history of multiple sclerosis. Scattered patchy involvement of the pons and medulla as well as the cerebellum present as well. In comparison with previous MRI, overall appearance has mildly progressed as evidence by a few scattered new and/or more prominent lesions as compared to previous. For example, few small juxta cortical lesions seen within the right parietal region appear to be new from previous (series 15, image 17). Few of the additional pre cystic lesions or also mildly increased in size. No abnormal restricted diffusion or enhancement to suggest active demyelination on today's exam. No evidence for acute or subacute infarct. Gray-white matter differentiation maintained. No encephalomalacia to suggest chronic cortical infarction. No foci of susceptibility artifact to suggest acute or chronic intracranial hemorrhage. No mass lesion, midline shift or mass effect. No hydrocephalus. No extra-axial fluid collection. Pituitary gland suprasellar region normal. Midline  structures intact. Vascular: Major intracranial vascular flow voids are maintained. Skull and upper cervical spine: Craniocervical junction within normal limits. Bone marrow signal intensity normal. 19 mm sclerotic lesion at the right frontal scalp noted, indeterminate, but stable from previous, and of doubtful significance. Scalp soft tissues demonstrate no acute finding. Sinuses/Orbits: Globes and orbital soft tissues within normal limits. Paranasal sinuses are clear. No mastoid effusion. Inner ear structures normal. Other: None. MRI CERVICAL SPINE FINDINGS Alignment: Straightening of the normal cervical lordosis. No listhesis. Vertebrae: Vertebral body height maintained without evidence for acute or chronic fracture. Bone marrow signal intensity within normal limits. Benign hemangioma noted within the C7 vertebral body. No other discrete or worrisome osseous lesions. No abnormal marrow edema. Cord: Patchy signal abnormality within the right hemi cord at the level of C2 (series 4, image 8), not definite seen on previous. Additional patchy signal abnormality within the central cord at the level of C2-3 (series 4, image 11), more prominent as compared to previous. Patchy signal abnormality within the central cord at the level of C3, slightly more prominent (series 4, image 15). Focal signal abnormality within the left dorsal cord at the level of C3-4 (series 4, image 17), more prominent from previous. Minimal patchy signal abnormality within the central cord at the level of C4 (series 4, image 19), more prominent. Probable additional subtle lesion within the right dorsal cord at the level of C6 (series 4, image 29), also more prominent. Findings consistent with history of demyelinating disease, and appear progressed from previous. No abnormal enhancement to suggest active demyelination. Posterior Fossa, vertebral arteries, paraspinal tissues: Craniocervical junction within normal limits. Paraspinous and prevertebral  soft tissues are normal. Normal intravascular flow voids  seen within the vertebral arteries bilaterally. Disc levels: C2-C3: Mild disc bulge with left-sided uncovertebral hypertrophy. No significant spinal stenosis. Mild left C3 foraminal narrowing. C3-C4: Mild disc bulge with left-sided uncovertebral hypertrophy. No significant spinal stenosis. Mild left C4 foraminal narrowing. C4-C5: Mild disc bulge with right-sided uncovertebral hypertrophy. Flattening of the ventral thecal sac without significant spinal stenosis. Moderate right C5 foraminal stenosis. C5-C6: Shallow right paracentral disc protrusion indents the right ventral thecal sac (series 5, image 27). Lateral extension into the right neural foramen. Mild spinal stenosis without cord impingement. Moderate right with mild left C6 foraminal narrowing. C6-C7: Shallow broad-based central disc protrusion indents the ventral thecal sac. Mild spinal stenosis or cord impingement. Superimposed uncovertebral hypertrophy with resultant mild left C7 foraminal stenosis. C7-T1:  Unremarkable. MRI THORACIC SPINE FINDINGS Alignment: Vertebral bodies normally aligned with preservation of the normal thoracic kyphosis. No listhesis or subluxation. Vertebrae: Vertebral body height maintained without evidence for acute or chronic fracture. Bone marrow signal intensity within normal limits. Few scattered benign hemangiomata noted, with small atypical hemangioma noted within the T5 vertebral body. No worrisome osseous lesions. No abnormal marrow edema or enhancement. Cord: Signal intensity within the thoracic spinal cord is within normal limits. No definite cord lesions to suggest demyelinating disease. Normal cord caliber and morphology. No abnormal enhancement. Paraspinal soft tissues: Paraspinous soft tissues within normal limits. Partially visualized lungs are clear. Visualized visceral structures unremarkable. Disc levels: No significant disc pathology seen within the  thoracic spine. No disc bulge or focal disc protrusion. No significant facet disease. No canal or neural foraminal stenosis. No impingement. IMPRESSION: MRI HEAD IMPRESSION: 1. Patchy multifocal signal changes involving the supratentorial and infratentorial cerebral white matter as above, compatible with provided history of multiple sclerosis. Overall, changes are mildly progressed relative to previous MRI from 2019. No evidence for active demyelination. 2. No other acute intracranial abnormality. MRI CERVICAL SPINE IMPRESSION: 1. Patchy multifocal cord signal abnormality involving the cervical spinal cord as detailed above, consistent with history of multiple sclerosis. Overall, appearance appears mildly progressed relative to 2019. No evidence for active demyelination. 2. Degenerative spondylosis at C5-6 and C6-7 with resultant mild spinal stenosis, with moderate right C6 and mild left C7 foraminal narrowing. 3. Mild left C3 and C4 foraminal stenosis, with moderate right C5 foraminal narrowing related to disc bulge and uncovertebral disease. MRI THORACIC SPINE IMPRESSION: Normal MRI of the thoracic spine and spinal cord. No evidence for demyelinating disease. No significant disc pathology, stenosis, or neural impingement. Electronically Signed   By: Rise Mu M.D.   On: 03/31/2019 01:02    EKG: Independently reviewed.  Normal sinus rhythm no acute ST-T changes poor progression of R waves lateral leads rule out ischemia rule out lead misplacement, patient terminates  Multiple sclerosis Questionable flareup per patient symptoms Patient has baclofen pump MRI head C-spine LS-spine no enhancement Neurology consult done will follow recommendation 1. Call [facility] in the AM for reprogramming of baclofen pump to administer lower dose.  2. Observe for possible improvement. If improvement, consider further baclofen dose reduction to optimize balance between management of her spasticity  without increasing muscle weakness. If no improvement with baclofen dose reduction, call Neurology for further assessment and possible initiation of IV Solumedrol  GERD PPI  Asthma Resume inhaler  Depression Resume home medication   Assessment/Plan Active Problems:   Multiple sclerosis (HCC)   High risk medication use   Muscle spasticity   Gait disturbance   Depression with anxiety   Spastic diplegia,  acquired, lower extremity (HCC)   Presence of intrathecal baclofen pump   Spell of generalized weakness   Acute weakness      DVT prophylaxis:  Code Status: Full code Family Communication: No Disposition Plan: Discharge home Consults called: Yes by emergency room physician Admission status: Inpatient   Athanasia Stanwood G Jestin Burbach MD [facility]  If 7PM-7AM, please contact night-coverage [email or web address]   03/31/2019, 5:15 AM

## 2019-03-31 NOTE — ED Notes (Signed)
Neuro provider bedside 

## 2019-03-31 NOTE — Progress Notes (Signed)
PROGRESS NOTE                                                                                                                                                                                                             Patient Demographics:    Sharon Odom, is a 48 y.o. female, DOB - [birth year], YDX:[id number]  Admit date - 03/30/2019   Admitting Physician No admitting provider for patient encounter.  Outpatient Primary MD for the patient is Annita Brod, MD  LOS - 0  Outpatient Specialists: Dr Ollen Bowl ( neurosurgery) Dr Epimenio Foot ( neurology)  Chief Complaint  Patient presents with   Weakness       Brief Narrative   48 y/o female with hx of multiple sclerosis, GERD, depression and asthma who had an intrathecal baclofen pump inserted by neurosurgery on 9/23 presented to ED with increasing muscle weakness, spasms and blurry vision. Also reported being more lethargic with hoarse voice for past 48 hrs.    Subjective:    reports having several episodes of muscle spasms and weakness, mainly lt sided. Also c/o some dysphagia and occasional choking for past 2 weeks and being constipated.   Assessment  & Plan :    Active Problems: Acute worsening motor function Suspect due to adverse effect of baclofen given progressive symptoms since insertion of  the pulm 5 weeks back.. Neurology consult appreciated. MRI brain, cervical and thoracic spine shows no acute demyelinating lesion.   spoke with Dr Ollen Bowl. [employer] rep saw pt this am and adjusted baclofen pump and resumed at lower dose.  continue neurochecks, PT/OT.    Multiple sclerosis (HCC) No acute or progressive enhancement/ demyelination on MRI. No need for IV solumedrol per neurology. Patient requesting solumedrol and ive told her why she doesn't need it at present. She is on  on Lemtrada for disease-modifying treatment.  was seen by ophthalmologist recently for blurry  vision and did not find anything acute, possibly symptoms are due to baclofen.   Acute dysphagia  reports symptoms past 2-3 weeks with occasional choking and delayed swallowing. Ordered SLP eval. Continue PPI for GERD.   Asthma  stable. Home inhaler  Chronic depression  resume home meds   constipation  reports no BM for almost a week. Add miralax.   Code Status : full code  Family Communication  : none  Disposition Plan  :home pending clinical improvement   Barriers For Discharge : active symptoms  Consults  :  Neurology/ neurosx Procedures  : MRI brain, cervical and thoracic spine  DVT Prophylaxis  :  Lovenox -   Lab Results  Component Value Date   PLT 271 03/30/2019    Antibiotics  :    Anti-infectives (From admission, onward)   None        Objective:   Vitals:   03/31/19 0600 03/31/19 0615 03/31/19 0700 03/31/19 0801  BP: 117/67 135/78 128/68 134/81  Pulse: 85 83 69 71  Resp: 12 10 14 14   Temp:      TempSrc:      SpO2: 98% 100% 100% 98%    Wt Readings from Last 3 Encounters:  02/18/19 93 kg  04/14/18 92.8 kg  01/23/18 94.8 kg    No intake or output data in the 24 hours ending 03/31/19 0956   Physical Exam  Gen: not in distress, fatigued HEENT: pupils reactive b/l, EOMI,  moist mucosa, supple neck Chest: clear b/l, no added sounds CVS: N S1&S2, no murmurs,  GI: soft, NT, ND, BS+ Musculoskeletal: warm, no edema CNS: AAOX3, LE strength 4/5, spasm with contraction noted on left hand, normal sensation in all extremities.    Data Review:    CBC Recent Labs  Lab 03/30/19 1803  WBC 11.0*  HGB 15.5*  HCT 48.9*  PLT 271  MCV 102.5*  MCH 32.5  MCHC 31.7  RDW 12.5    Chemistries  Recent Labs  Lab 03/30/19 1803 03/31/19 0551  NA 136  --   K 4.0  --   CL 101  --   CO2 24  --   GLUCOSE 99  --   BUN 13  --   CREATININE 0.86  --   CALCIUM 9.6  --   MG  --  2.2    ------------------------------------------------------------------------------------------------------------------ No results for input(s): CHOL, HDL, LDLCALC, TRIG, CHOLHDL, LDLDIRECT in the last 72 hours.  No results found for: HGBA1C ------------------------------------------------------------------------------------------------------------------ Recent Labs    03/31/19 0551  TSH 1.184   ------------------------------------------------------------------------------------------------------------------ No results for input(s): VITAMINB12, FOLATE, FERRITIN, TIBC, IRON, RETICCTPCT in the last 72 hours.  Coagulation profile No results for input(s): INR, PROTIME in the last 168 hours.  No results for input(s): DDIMER in the last 72 hours.  Cardiac Enzymes No results for input(s): CKMB, TROPONINI, MYOGLOBIN in the last 168 hours.  Invalid input(s): CK ------------------------------------------------------------------------------------------------------------------ No results found for: BNP  Inpatient Medications  Scheduled Meds:  acetaminophen  1,000 mg Oral Q8H   baclofen  20 mg Oral TID   buPROPion  300 mg Oral Daily   cholecalciferol  5,000 Units Oral Daily   dalfampridine  10 mg Oral BID   diazepam  5-10 mg Oral See admin instructions   DULoxetine  30 mg Oral BID   enoxaparin (LOVENOX) injection  40 mg Subcutaneous Q24H   famotidine  20 mg Oral QHS   montelukast  10 mg Oral Daily   oxybutynin  10 mg Oral Daily   polyethylene glycol  17 g Oral Daily   sodium chloride flush  3 mL Intravenous Once   vitamin B-12  2,500 mcg Oral Daily   Continuous Infusions:  sodium chloride     sodium chloride 100 mL/hr at 03/31/19 0610   PRN Meds:.albuterol, ibuprofen, ondansetron, oxyCODONE-acetaminophen **AND** oxyCODONE  Micro Results Recent Results (from the past 240 hour(s))  SARS CORONAVIRUS 2 (TAT 6-24 HRS) Nasopharyngeal Nasopharyngeal  Swab     Status: None    Collection Time: 03/30/19  7:27 PM   Specimen: Nasopharyngeal Swab  Result Value Ref Range Status   SARS Coronavirus 2 NEGATIVE NEGATIVE Final    Comment: (NOTE) SARS-CoV-2 target nucleic acids are NOT DETECTED. The SARS-CoV-2 RNA is generally detectable in upper and lower respiratory specimens during the acute phase of infection. Negative results do not preclude SARS-CoV-2 infection, do not rule out co-infections with other pathogens, and should not be used as the sole basis for treatment or other patient management decisions. Negative results must be combined with clinical observations, patient history, and epidemiological information. The expected result is Negative. Fact Sheet for Patients: HairSlick.nohttps://www.fda.gov/media/138098/download Fact Sheet for Healthcare Providers: quierodirigir.comhttps://www.fda.gov/media/138095/download This test is not yet approved or cleared by the [place] FDA and  has been authorized for detection and/or diagnosis of SARS-CoV-2 by FDA under an Emergency Use Authorization (EUA). This EUA will remain  in effect (meaning this test can be used) for the duration of the COVID-19 declaration under Section 56 4(b)(1) of the Act, 21 U.S.C. section 360bbb-3(b)(1), unless the authorization is terminated or revoked sooner. Performed at [facility], [address] [place], [place] [address]     Radiology Reports Mr Laqueta JeanBrain W And Wo Contrast  Result Date: 03/31/2019 CLINICAL DATA:  Initial evaluation for increasing leg weakness, spasms, blurry vision. History of multiple sclerosis. EXAM: MRI HEAD WITHOUT AND WITH CONTRAST MRI CERVICAL SPINE WITHOUT AND WITH CONTRAST MRI THORACIC SPINE WITHOUT AND WITH CONTRAST TECHNIQUE: Multiplanar, multiecho pulse sequences of the brain and surrounding structures, and cervical spine, to include the craniocervical junction and cervicothoracic junction, as well as the thoracic spine were obtained without and with intravenous contrast.  CONTRAST:  9mL GADAVIST GADOBUTROL 1 MMOL/ML IV SOLN COMPARISON:  Previous MRI from 02/05/2018. FINDINGS: MRI HEAD FINDINGS Brain: Cerebral volume stable, and remains within normal limits for age. Multiple scattered patchy T2/FLAIR hyperintensities seen involving the periventricular, deep, and juxta cortical white matter of both cerebral hemispheres, consistent with provided history of multiple sclerosis. Scattered patchy involvement of the pons and medulla as well as the cerebellum present as well. In comparison with previous MRI, overall appearance has mildly progressed as evidence by a few scattered new and/or more prominent lesions as compared to previous. For example, few small juxta cortical lesions seen within the right parietal region appear to be new from previous (series 15, image 17). Few of the additional pre cystic lesions or also mildly increased in size. No abnormal restricted diffusion or enhancement to suggest active demyelination on today's exam. No evidence for acute or subacute infarct. Gray-white matter differentiation maintained. No encephalomalacia to suggest chronic cortical infarction. No foci of susceptibility artifact to suggest acute or chronic intracranial hemorrhage. No mass lesion, midline shift or mass effect. No hydrocephalus. No extra-axial fluid collection. Pituitary gland suprasellar region normal. Midline structures intact. Vascular: Major intracranial vascular flow voids are maintained. Skull and upper cervical spine: Craniocervical junction within normal limits. Bone marrow signal intensity normal. 19 mm sclerotic lesion at the right frontal scalp noted, indeterminate, but stable from previous, and of doubtful significance. Scalp soft tissues demonstrate no acute finding. Sinuses/Orbits: Globes and orbital soft tissues within normal limits. Paranasal sinuses are clear. No mastoid effusion. Inner ear structures normal. Other: None. MRI CERVICAL SPINE FINDINGS Alignment:  Straightening of the normal cervical lordosis. No listhesis. Vertebrae: Vertebral body height maintained without evidence for acute or chronic fracture. Bone marrow signal intensity within normal limits.  Benign hemangioma noted within the C7 vertebral body. No other discrete or worrisome osseous lesions. No abnormal marrow edema. Cord: Patchy signal abnormality within the right hemi cord at the level of C2 (series 4, image 8), not definite seen on previous. Additional patchy signal abnormality within the central cord at the level of C2-3 (series 4, image 11), more prominent as compared to previous. Patchy signal abnormality within the central cord at the level of C3, slightly more prominent (series 4, image 15). Focal signal abnormality within the left dorsal cord at the level of C3-4 (series 4, image 17), more prominent from previous. Minimal patchy signal abnormality within the central cord at the level of C4 (series 4, image 19), more prominent. Probable additional subtle lesion within the right dorsal cord at the level of C6 (series 4, image 29), also more prominent. Findings consistent with history of demyelinating disease, and appear progressed from previous. No abnormal enhancement to suggest active demyelination. Posterior Fossa, vertebral arteries, paraspinal tissues: Craniocervical junction within normal limits. Paraspinous and prevertebral soft tissues are normal. Normal intravascular flow voids seen within the vertebral arteries bilaterally. Disc levels: C2-C3: Mild disc bulge with left-sided uncovertebral hypertrophy. No significant spinal stenosis. Mild left C3 foraminal narrowing. C3-C4: Mild disc bulge with left-sided uncovertebral hypertrophy. No significant spinal stenosis. Mild left C4 foraminal narrowing. C4-C5: Mild disc bulge with right-sided uncovertebral hypertrophy. Flattening of the ventral thecal sac without significant spinal stenosis. Moderate right C5 foraminal stenosis. C5-C6: Shallow  right paracentral disc protrusion indents the right ventral thecal sac (series 5, image 27). Lateral extension into the right neural foramen. Mild spinal stenosis without cord impingement. Moderate right with mild left C6 foraminal narrowing. C6-C7: Shallow broad-based central disc protrusion indents the ventral thecal sac. Mild spinal stenosis or cord impingement. Superimposed uncovertebral hypertrophy with resultant mild left C7 foraminal stenosis. C7-T1:  Unremarkable. MRI THORACIC SPINE FINDINGS Alignment: Vertebral bodies normally aligned with preservation of the normal thoracic kyphosis. No listhesis or subluxation. Vertebrae: Vertebral body height maintained without evidence for acute or chronic fracture. Bone marrow signal intensity within normal limits. Few scattered benign hemangiomata noted, with small atypical hemangioma noted within the T5 vertebral body. No worrisome osseous lesions. No abnormal marrow edema or enhancement. Cord: Signal intensity within the thoracic spinal cord is within normal limits. No definite cord lesions to suggest demyelinating disease. Normal cord caliber and morphology. No abnormal enhancement. Paraspinal soft tissues: Paraspinous soft tissues within normal limits. Partially visualized lungs are clear. Visualized visceral structures unremarkable. Disc levels: No significant disc pathology seen within the thoracic spine. No disc bulge or focal disc protrusion. No significant facet disease. No canal or neural foraminal stenosis. No impingement. IMPRESSION: MRI HEAD IMPRESSION: 1. Patchy multifocal signal changes involving the supratentorial and infratentorial cerebral white matter as above, compatible with provided history of multiple sclerosis. Overall, changes are mildly progressed relative to previous MRI from 2019. No evidence for active demyelination. 2. No other acute intracranial abnormality. MRI CERVICAL SPINE IMPRESSION: 1. Patchy multifocal cord signal abnormality  involving the cervical spinal cord as detailed above, consistent with history of multiple sclerosis. Overall, appearance appears mildly progressed relative to 2019. No evidence for active demyelination. 2. Degenerative spondylosis at C5-6 and C6-7 with resultant mild spinal stenosis, with moderate right C6 and mild left C7 foraminal narrowing. 3. Mild left C3 and C4 foraminal stenosis, with moderate right C5 foraminal narrowing related to disc bulge and uncovertebral disease. MRI THORACIC SPINE IMPRESSION: Normal MRI of the thoracic spine and spinal  cord. No evidence for demyelinating disease. No significant disc pathology, stenosis, or neural impingement. Electronically Signed   By: Rise Mu M.D.   On: 03/31/2019 01:02   Mr Cervical Spine W Or Wo Contrast  Result Date: 03/31/2019 CLINICAL DATA:  Initial evaluation for increasing leg weakness, spasms, blurry vision. History of multiple sclerosis. EXAM: MRI HEAD WITHOUT AND WITH CONTRAST MRI CERVICAL SPINE WITHOUT AND WITH CONTRAST MRI THORACIC SPINE WITHOUT AND WITH CONTRAST TECHNIQUE: Multiplanar, multiecho pulse sequences of the brain and surrounding structures, and cervical spine, to include the craniocervical junction and cervicothoracic junction, as well as the thoracic spine were obtained without and with intravenous contrast. CONTRAST:  9mL GADAVIST GADOBUTROL 1 MMOL/ML IV SOLN COMPARISON:  Previous MRI from 02/05/2018. FINDINGS: MRI HEAD FINDINGS Brain: Cerebral volume stable, and remains within normal limits for age. Multiple scattered patchy T2/FLAIR hyperintensities seen involving the periventricular, deep, and juxta cortical white matter of both cerebral hemispheres, consistent with provided history of multiple sclerosis. Scattered patchy involvement of the pons and medulla as well as the cerebellum present as well. In comparison with previous MRI, overall appearance has mildly progressed as evidence by a few scattered new and/or more  prominent lesions as compared to previous. For example, few small juxta cortical lesions seen within the right parietal region appear to be new from previous (series 15, image 17). Few of the additional pre cystic lesions or also mildly increased in size. No abnormal restricted diffusion or enhancement to suggest active demyelination on today's exam. No evidence for acute or subacute infarct. Gray-white matter differentiation maintained. No encephalomalacia to suggest chronic cortical infarction. No foci of susceptibility artifact to suggest acute or chronic intracranial hemorrhage. No mass lesion, midline shift or mass effect. No hydrocephalus. No extra-axial fluid collection. Pituitary gland suprasellar region normal. Midline structures intact. Vascular: Major intracranial vascular flow voids are maintained. Skull and upper cervical spine: Craniocervical junction within normal limits. Bone marrow signal intensity normal. 19 mm sclerotic lesion at the right frontal scalp noted, indeterminate, but stable from previous, and of doubtful significance. Scalp soft tissues demonstrate no acute finding. Sinuses/Orbits: Globes and orbital soft tissues within normal limits. Paranasal sinuses are clear. No mastoid effusion. Inner ear structures normal. Other: None. MRI CERVICAL SPINE FINDINGS Alignment: Straightening of the normal cervical lordosis. No listhesis. Vertebrae: Vertebral body height maintained without evidence for acute or chronic fracture. Bone marrow signal intensity within normal limits. Benign hemangioma noted within the C7 vertebral body. No other discrete or worrisome osseous lesions. No abnormal marrow edema. Cord: Patchy signal abnormality within the right hemi cord at the level of C2 (series 4, image 8), not definite seen on previous. Additional patchy signal abnormality within the central cord at the level of C2-3 (series 4, image 11), more prominent as compared to previous. Patchy signal abnormality  within the central cord at the level of C3, slightly more prominent (series 4, image 15). Focal signal abnormality within the left dorsal cord at the level of C3-4 (series 4, image 17), more prominent from previous. Minimal patchy signal abnormality within the central cord at the level of C4 (series 4, image 19), more prominent. Probable additional subtle lesion within the right dorsal cord at the level of C6 (series 4, image 29), also more prominent. Findings consistent with history of demyelinating disease, and appear progressed from previous. No abnormal enhancement to suggest active demyelination. Posterior Fossa, vertebral arteries, paraspinal tissues: Craniocervical junction within normal limits. Paraspinous and prevertebral soft tissues are normal. Normal intravascular  flow voids seen within the vertebral arteries bilaterally. Disc levels: C2-C3: Mild disc bulge with left-sided uncovertebral hypertrophy. No significant spinal stenosis. Mild left C3 foraminal narrowing. C3-C4: Mild disc bulge with left-sided uncovertebral hypertrophy. No significant spinal stenosis. Mild left C4 foraminal narrowing. C4-C5: Mild disc bulge with right-sided uncovertebral hypertrophy. Flattening of the ventral thecal sac without significant spinal stenosis. Moderate right C5 foraminal stenosis. C5-C6: Shallow right paracentral disc protrusion indents the right ventral thecal sac (series 5, image 27). Lateral extension into the right neural foramen. Mild spinal stenosis without cord impingement. Moderate right with mild left C6 foraminal narrowing. C6-C7: Shallow broad-based central disc protrusion indents the ventral thecal sac. Mild spinal stenosis or cord impingement. Superimposed uncovertebral hypertrophy with resultant mild left C7 foraminal stenosis. C7-T1:  Unremarkable. MRI THORACIC SPINE FINDINGS Alignment: Vertebral bodies normally aligned with preservation of the normal thoracic kyphosis. No listhesis or subluxation.  Vertebrae: Vertebral body height maintained without evidence for acute or chronic fracture. Bone marrow signal intensity within normal limits. Few scattered benign hemangiomata noted, with small atypical hemangioma noted within the T5 vertebral body. No worrisome osseous lesions. No abnormal marrow edema or enhancement. Cord: Signal intensity within the thoracic spinal cord is within normal limits. No definite cord lesions to suggest demyelinating disease. Normal cord caliber and morphology. No abnormal enhancement. Paraspinal soft tissues: Paraspinous soft tissues within normal limits. Partially visualized lungs are clear. Visualized visceral structures unremarkable. Disc levels: No significant disc pathology seen within the thoracic spine. No disc bulge or focal disc protrusion. No significant facet disease. No canal or neural foraminal stenosis. No impingement. IMPRESSION: MRI HEAD IMPRESSION: 1. Patchy multifocal signal changes involving the supratentorial and infratentorial cerebral white matter as above, compatible with provided history of multiple sclerosis. Overall, changes are mildly progressed relative to previous MRI from 2019. No evidence for active demyelination. 2. No other acute intracranial abnormality. MRI CERVICAL SPINE IMPRESSION: 1. Patchy multifocal cord signal abnormality involving the cervical spinal cord as detailed above, consistent with history of multiple sclerosis. Overall, appearance appears mildly progressed relative to 2019. No evidence for active demyelination. 2. Degenerative spondylosis at C5-6 and C6-7 with resultant mild spinal stenosis, with moderate right C6 and mild left C7 foraminal narrowing. 3. Mild left C3 and C4 foraminal stenosis, with moderate right C5 foraminal narrowing related to disc bulge and uncovertebral disease. MRI THORACIC SPINE IMPRESSION: Normal MRI of the thoracic spine and spinal cord. No evidence for demyelinating disease. No significant disc pathology,  stenosis, or neural impingement. Electronically Signed   By: Rise Mu M.D.   On: 03/31/2019 01:02   Mr Thoracic Spine W Wo Contrast  Result Date: 03/31/2019 CLINICAL DATA:  Initial evaluation for increasing leg weakness, spasms, blurry vision. History of multiple sclerosis. EXAM: MRI HEAD WITHOUT AND WITH CONTRAST MRI CERVICAL SPINE WITHOUT AND WITH CONTRAST MRI THORACIC SPINE WITHOUT AND WITH CONTRAST TECHNIQUE: Multiplanar, multiecho pulse sequences of the brain and surrounding structures, and cervical spine, to include the craniocervical junction and cervicothoracic junction, as well as the thoracic spine were obtained without and with intravenous contrast. CONTRAST:  9mL GADAVIST GADOBUTROL 1 MMOL/ML IV SOLN COMPARISON:  Previous MRI from 02/05/2018. FINDINGS: MRI HEAD FINDINGS Brain: Cerebral volume stable, and remains within normal limits for age. Multiple scattered patchy T2/FLAIR hyperintensities seen involving the periventricular, deep, and juxta cortical white matter of both cerebral hemispheres, consistent with provided history of multiple sclerosis. Scattered patchy involvement of the pons and medulla as well as the cerebellum present as well.  In comparison with previous MRI, overall appearance has mildly progressed as evidence by a few scattered new and/or more prominent lesions as compared to previous. For example, few small juxta cortical lesions seen within the right parietal region appear to be new from previous (series 15, image 17). Few of the additional pre cystic lesions or also mildly increased in size. No abnormal restricted diffusion or enhancement to suggest active demyelination on today's exam. No evidence for acute or subacute infarct. Gray-white matter differentiation maintained. No encephalomalacia to suggest chronic cortical infarction. No foci of susceptibility artifact to suggest acute or chronic intracranial hemorrhage. No mass lesion, midline shift or mass effect. No  hydrocephalus. No extra-axial fluid collection. Pituitary gland suprasellar region normal. Midline structures intact. Vascular: Major intracranial vascular flow voids are maintained. Skull and upper cervical spine: Craniocervical junction within normal limits. Bone marrow signal intensity normal. 19 mm sclerotic lesion at the right frontal scalp noted, indeterminate, but stable from previous, and of doubtful significance. Scalp soft tissues demonstrate no acute finding. Sinuses/Orbits: Globes and orbital soft tissues within normal limits. Paranasal sinuses are clear. No mastoid effusion. Inner ear structures normal. Other: None. MRI CERVICAL SPINE FINDINGS Alignment: Straightening of the normal cervical lordosis. No listhesis. Vertebrae: Vertebral body height maintained without evidence for acute or chronic fracture. Bone marrow signal intensity within normal limits. Benign hemangioma noted within the C7 vertebral body. No other discrete or worrisome osseous lesions. No abnormal marrow edema. Cord: Patchy signal abnormality within the right hemi cord at the level of C2 (series 4, image 8), not definite seen on previous. Additional patchy signal abnormality within the central cord at the level of C2-3 (series 4, image 11), more prominent as compared to previous. Patchy signal abnormality within the central cord at the level of C3, slightly more prominent (series 4, image 15). Focal signal abnormality within the left dorsal cord at the level of C3-4 (series 4, image 17), more prominent from previous. Minimal patchy signal abnormality within the central cord at the level of C4 (series 4, image 19), more prominent. Probable additional subtle lesion within the right dorsal cord at the level of C6 (series 4, image 29), also more prominent. Findings consistent with history of demyelinating disease, and appear progressed from previous. No abnormal enhancement to suggest active demyelination. Posterior Fossa, vertebral  arteries, paraspinal tissues: Craniocervical junction within normal limits. Paraspinous and prevertebral soft tissues are normal. Normal intravascular flow voids seen within the vertebral arteries bilaterally. Disc levels: C2-C3: Mild disc bulge with left-sided uncovertebral hypertrophy. No significant spinal stenosis. Mild left C3 foraminal narrowing. C3-C4: Mild disc bulge with left-sided uncovertebral hypertrophy. No significant spinal stenosis. Mild left C4 foraminal narrowing. C4-C5: Mild disc bulge with right-sided uncovertebral hypertrophy. Flattening of the ventral thecal sac without significant spinal stenosis. Moderate right C5 foraminal stenosis. C5-C6: Shallow right paracentral disc protrusion indents the right ventral thecal sac (series 5, image 27). Lateral extension into the right neural foramen. Mild spinal stenosis without cord impingement. Moderate right with mild left C6 foraminal narrowing. C6-C7: Shallow broad-based central disc protrusion indents the ventral thecal sac. Mild spinal stenosis or cord impingement. Superimposed uncovertebral hypertrophy with resultant mild left C7 foraminal stenosis. C7-T1:  Unremarkable. MRI THORACIC SPINE FINDINGS Alignment: Vertebral bodies normally aligned with preservation of the normal thoracic kyphosis. No listhesis or subluxation. Vertebrae: Vertebral body height maintained without evidence for acute or chronic fracture. Bone marrow signal intensity within normal limits. Few scattered benign hemangiomata noted, with small atypical hemangioma noted within the  T5 vertebral body. No worrisome osseous lesions. No abnormal marrow edema or enhancement. Cord: Signal intensity within the thoracic spinal cord is within normal limits. No definite cord lesions to suggest demyelinating disease. Normal cord caliber and morphology. No abnormal enhancement. Paraspinal soft tissues: Paraspinous soft tissues within normal limits. Partially visualized lungs are clear.  Visualized visceral structures unremarkable. Disc levels: No significant disc pathology seen within the thoracic spine. No disc bulge or focal disc protrusion. No significant facet disease. No canal or neural foraminal stenosis. No impingement. IMPRESSION: MRI HEAD IMPRESSION: 1. Patchy multifocal signal changes involving the supratentorial and infratentorial cerebral white matter as above, compatible with provided history of multiple sclerosis. Overall, changes are mildly progressed relative to previous MRI from 2019. No evidence for active demyelination. 2. No other acute intracranial abnormality. MRI CERVICAL SPINE IMPRESSION: 1. Patchy multifocal cord signal abnormality involving the cervical spinal cord as detailed above, consistent with history of multiple sclerosis. Overall, appearance appears mildly progressed relative to 2019. No evidence for active demyelination. 2. Degenerative spondylosis at C5-6 and C6-7 with resultant mild spinal stenosis, with moderate right C6 and mild left C7 foraminal narrowing. 3. Mild left C3 and C4 foraminal stenosis, with moderate right C5 foraminal narrowing related to disc bulge and uncovertebral disease. MRI THORACIC SPINE IMPRESSION: Normal MRI of the thoracic spine and spinal cord. No evidence for demyelinating disease. No significant disc pathology, stenosis, or neural impingement. Electronically Signed   By: Rise Mu M.D.   On: 03/31/2019 01:02    Time Spent in minutes  25   Zackarie Chason M.D on 03/31/2019 at 9:56 AM  Between 7am to 7pm - Pager - [phone]  After 7pm go to [email or web address] - password [email or web address]  [facility] -  Office  [phone]

## 2019-04-01 DIAGNOSIS — G8220 Paraplegia, unspecified: Secondary | ICD-10-CM

## 2019-04-01 LAB — CBC
HCT: 44.9 % (ref 36.0–46.0)
Hemoglobin: 15 g/dL (ref 12.0–15.0)
MCH: 32.8 pg (ref 26.0–34.0)
MCHC: 33.4 g/dL (ref 30.0–36.0)
MCV: 98 fL (ref 80.0–100.0)
Platelets: 252 10*3/uL (ref 150–400)
RBC: 4.58 MIL/uL (ref 3.87–5.11)
RDW: 12.5 % (ref 11.5–15.5)
WBC: 6.5 10*3/uL (ref 4.0–10.5)
nRBC: 0 % (ref 0.0–0.2)

## 2019-04-01 LAB — COMPREHENSIVE METABOLIC PANEL
ALT: 19 U/L (ref 0–44)
AST: 15 U/L (ref 15–41)
Albumin: 3.5 g/dL (ref 3.5–5.0)
Alkaline Phosphatase: 82 U/L (ref 38–126)
Anion gap: 8 (ref 5–15)
BUN: 15 mg/dL (ref 6–20)
CO2: 21 mmol/L — ABNORMAL LOW (ref 22–32)
Calcium: 9.4 mg/dL (ref 8.9–10.3)
Chloride: 111 mmol/L (ref 98–111)
Creatinine, Ser: 0.89 mg/dL (ref 0.44–1.00)
GFR calc Af Amer: >60 mL/min (ref >60)
GFR calc non Af Amer: >60 mL/min (ref >60)
Glucose, Bld: 201 mg/dL — ABNORMAL HIGH (ref 70–99)
Potassium: 4.3 mmol/L (ref 3.5–5.1)
Sodium: 140 mmol/L (ref 135–145)
Total Bilirubin: 0.4 mg/dL (ref 0.3–1.2)
Total Protein: 6.5 g/dL (ref 6.5–8.1)

## 2019-04-01 MED ORDER — DALFAMPRIDINE ER 10 MG PO TB12
10.0000 mg | ORAL_TABLET | Freq: Two times a day (BID) | ORAL | Status: DC
  Administered 2019-04-02 – 2019-04-03 (×3): 10 mg via ORAL
  Filled 2019-04-01 (×3): qty 1

## 2019-04-01 MED ORDER — PANTOPRAZOLE SODIUM 40 MG PO TBEC
40.0000 mg | DELAYED_RELEASE_TABLET | Freq: Every day | ORAL | Status: DC
  Administered 2019-04-01 – 2019-04-03 (×3): 40 mg via ORAL
  Filled 2019-04-01 (×3): qty 1

## 2019-04-01 NOTE — Progress Notes (Signed)
PROGRESS NOTE                                                                                                                                                                                                             Patient Demographics:    Sharon Odom, is a 48 y.o. female, DOB - 1970-06-02, ZOX:[id number]  Admit date - 03/30/2019   Admitting Physician Leona Singleton, MD  Outpatient Primary MD for the patient is Annita Brod, MD  LOS - 1  Outpatient Specialists: Dr Ollen Bowl ( neurosurgery) Dr Epimenio Foot ( neurology)  Chief Complaint  Patient presents with   Weakness       Brief Narrative   48 y/o female with hx of multiple sclerosis, GERD, depression and asthma who had an intrathecal baclofen pump inserted by neurosurgery on 9/23 presented to ED with increasing muscle weakness, spasms and blurry vision. Also reported being more lethargic with hoarse voice for past 48 hrs.    Subjective:   Reports muscle spasms and UE weakness showing some improvement   Assessment  & Plan :    Active Problems: Acute worsening motor function Suspect due to adverse effect of baclofen given progressive symptoms since insertion of  the pump 5 weeks back.. Neurology consult appreciated. MRI brain, cervical and thoracic spine shows no acute demyelinating lesion.  neurosurgery Dr Ollen Bowl consulted. adjusted baclofen pump and resumed at lower dose.  continue neurochecks, PT/OT.    Multiple sclerosis (HCC) No acute or progressive enhancement/ demyelination on MRI. neurohospitalist d/w pts neurologist Dr Epimenio Foot who recommended that although imaging was negative for enhancment, patient having gait disturbance may benefit from 3 does of IV solumedrol. ( started on 11/3 ). She is on [place] as outpt for disease-modifying treatment. Was seen by ophthalmologist recently for blurry vision and did not find anything acute, possibly symptoms are  due to baclofen.   Acute dysphagia  reports symptoms past 2-3 weeks with occasional choking and delayed swallowing. Ordered SLP eval. Continue PPI for GERD.   Asthma  stable. Home inhaler  Chronic depression  resume home meds   constipation  reports no BM for almost a week. Added miralax.   Code Status : full code  Family Communication  : none  Disposition Plan  :home pending clinical improvement, PT eva. Possibly in next 48 hrs   Barriers For  Discharge : active symptoms  Consults  :  Neurology/ neurosx Procedures  : MRI brain, cervical and thoracic spine  DVT Prophylaxis  :  Lovenox -   Lab Results  Component Value Date   PLT 252 04/01/2019    Antibiotics  :    Anti-infectives (From admission, onward)   None        Objective:   Vitals:   03/31/19 1524 03/31/19 1524 03/31/19 2035 04/01/19 0640  BP:  124/79 109/73 115/74  Pulse:  89 72 75  Resp:  Temp: 98.1 F (36.7 C) 98.1 F (36.7 C) 98.7 F (37.1 C) 98.8 F (37.1 C)  TempSrc: Oral  Oral Oral  SpO2:  94% 92% 98%  Weight: 89 kg     Height:  (1.753 m)       Wt Readings from Last 3 Encounters:  03/31/19 89 kg  02/18/19 93 kg  04/14/18 92.8 kg     Intake/Output Summary (Last 24 hours) at 04/01/2019 1056 Last data filed at 04/01/2019 0900 Gross per 24 hour  Intake 498.16 ml  Output --  Net 498.16 ml    Physical exam  in better mood, NAD  HEENT: moist mucosa, supple neck  chest: clear b/l CVS: NS1&S2, no murmurs GI: soft, NT, ND, BS+ Musculoskeletal: warn, no edema  CNS: Alert and oriented, improved strength in UE, 4/5 power in LLE ( reports chronic)     Data Review:    CBC Recent Labs  Lab 03/30/19 1803 04/01/19 0227  WBC 11.0* 6.5  HGB 15.5* 15.0  HCT 48.9* 44.9  PLT 271 252  MCV 102.5* 98.0  MCH 32.5 32.8  MCHC 31.7 33.4  RDW 12.5 12.5    Chemistries  Recent Labs  Lab 03/30/19 1803 03/31/19 0551 04/01/19 0227  NA 136  --  140  K 4.0  --  4.3  CL  101  --  111  CO2 24  --  21*  GLUCOSE 99  --  201*  BUN 13  --  15  CREATININE 0.86  --  0.89  CALCIUM 9.6  --  9.4  MG  --  2.2  --   AST  --   --  15  ALT  --   --  19  ALKPHOS  --   --  82  BILITOT  --   --  0.4   ------------------------------------------------------------------------------------------------------------------ No results for input(s): CHOL, HDL, LDLCALC, TRIG, CHOLHDL, LDLDIRECT in the last 72 hours.  No results found for: HGBA1C ------------------------------------------------------------------------------------------------------------------ Recent Labs    03/31/19 0551  TSH 1.184   ------------------------------------------------------------------------------------------------------------------ No results for input(s): VITAMINB12, FOLATE, FERRITIN, TIBC, IRON, RETICCTPCT in the last 72 hours.  Coagulation profile No results for input(s): INR, PROTIME in the last 168 hours.  No results for input(s): DDIMER in the last 72 hours.  Cardiac Enzymes No results for input(s): CKMB, TROPONINI, MYOGLOBIN in the last 168 hours.  Invalid input(s): CK ------------------------------------------------------------------------------------------------------------------ No results found for: BNP  Inpatient Medications  Scheduled Meds:  acetaminophen  1,000 mg Oral Q8H   baclofen  20 mg Oral TID   buPROPion  300 mg Oral Daily   cholecalciferol  5,000 Units Oral Daily   dalfampridine  10 mg Oral BID   DULoxetine  30 mg Oral BID   enoxaparin (LOVENOX) injection  40 mg Subcutaneous Q24H   famotidine  20 mg Oral QHS   montelukast  10 mg Oral Daily   oxybutynin  10 mg  Oral Daily   pantoprazole  40 mg Oral Daily   polyethylene glycol  17 g Oral Daily   sodium chloride flush  3 mL Intravenous Once   vitamin B-12  2,500 mcg Oral Daily   Continuous Infusions:  sodium chloride     methylPREDNISolone (SOLU-MEDROL) injection Stopped (03/31/19 2023)    PRN Meds:.albuterol, diazepam, ibuprofen, ondansetron, oxyCODONE-acetaminophen **AND** oxyCODONE  Micro Results Recent Results (from the past 240 hour(s))  SARS CORONAVIRUS 2 (TAT 6-24 HRS) Nasopharyngeal Nasopharyngeal Swab     Status: None   Collection Time: 03/30/19  7:27 PM   Specimen: Nasopharyngeal Swab  Result Value Ref Range Status   SARS Coronavirus 2 NEGATIVE NEGATIVE Final    Comment: (NOTE) SARS-CoV-2 target nucleic acids are NOT DETECTED. The SARS-CoV-2 RNA is generally detectable in upper and lower respiratory specimens during the acute phase of infection. Negative results do not preclude SARS-CoV-2 infection, do not rule out co-infections with other pathogens, and should not be used as the sole basis for treatment or other patient management decisions. Negative results must be combined with clinical observations, patient history, and epidemiological information. The expected result is Negative. Fact Sheet for Patients: HairSlick.no Fact Sheet for Healthcare Providers: quierodirigir.com This test is not yet approved or cleared by the [place] FDA and  has been authorized for detection and/or diagnosis of SARS-CoV-2 by FDA under an Emergency Use Authorization (EUA). This EUA will remain  in effect (meaning this test can be used) for the duration of the COVID-19 declaration under Section 56 4(b)(1) of the Act, 21 U.S.C. section 360bbb-3(b)(1), unless the authorization is terminated or revoked sooner. Performed at [facility], [address], [place], [place] [address]     Radiology Reports Mr Laqueta Jean And Wo Contrast  Result Date: 03/31/2019 CLINICAL DATA:  Initial evaluation for increasing leg weakness, spasms, blurry vision. History of multiple sclerosis. EXAM: MRI HEAD WITHOUT AND WITH CONTRAST MRI CERVICAL SPINE WITHOUT AND WITH CONTRAST MRI THORACIC SPINE WITHOUT AND WITH CONTRAST TECHNIQUE:  Multiplanar, multiecho pulse sequences of the brain and surrounding structures, and cervical spine, to include the craniocervical junction and cervicothoracic junction, as well as the thoracic spine were obtained without and with intravenous contrast. CONTRAST:  9mL GADAVIST GADOBUTROL 1 MMOL/ML IV SOLN COMPARISON:  Previous MRI from 02/05/2018. FINDINGS: MRI HEAD FINDINGS Brain: Cerebral volume stable, and remains within normal limits for age. Multiple scattered patchy T2/FLAIR hyperintensities seen involving the periventricular, deep, and juxta cortical white matter of both cerebral hemispheres, consistent with provided history of multiple sclerosis. Scattered patchy involvement of the pons and medulla as well as the cerebellum present as well. In comparison with previous MRI, overall appearance has mildly progressed as evidence by a few scattered new and/or more prominent lesions as compared to previous. For example, few small juxta cortical lesions seen within the right parietal region appear to be new from previous (series 15, image 17). Few of the additional pre cystic lesions or also mildly increased in size. No abnormal restricted diffusion or enhancement to suggest active demyelination on today's exam. No evidence for acute or subacute infarct. Gray-white matter differentiation maintained. No encephalomalacia to suggest chronic cortical infarction. No foci of susceptibility artifact to suggest acute or chronic intracranial hemorrhage. No mass lesion, midline shift or mass effect. No hydrocephalus. No extra-axial fluid collection. Pituitary gland suprasellar region normal. Midline structures intact. Vascular: Major intracranial vascular flow voids are maintained. Skull and upper cervical spine: Craniocervical junction within normal limits. Bone marrow  signal intensity normal. 19 mm sclerotic lesion at the right frontal scalp noted, indeterminate, but stable from previous, and of doubtful significance. Scalp  soft tissues demonstrate no acute finding. Sinuses/Orbits: Globes and orbital soft tissues within normal limits. Paranasal sinuses are clear. No mastoid effusion. Inner ear structures normal. Other: None. MRI CERVICAL SPINE FINDINGS Alignment: Straightening of the normal cervical lordosis. No listhesis. Vertebrae: Vertebral body height maintained without evidence for acute or chronic fracture. Bone marrow signal intensity within normal limits. Benign hemangioma noted within the C7 vertebral body. No other discrete or worrisome osseous lesions. No abnormal marrow edema. Cord: Patchy signal abnormality within the right hemi cord at the level of C2 (series 4, image 8), not definite seen on previous. Additional patchy signal abnormality within the central cord at the level of C2-3 (series 4, image 11), more prominent as compared to previous. Patchy signal abnormality within the central cord at the level of C3, slightly more prominent (series 4, image 15). Focal signal abnormality within the left dorsal cord at the level of C3-4 (series 4, image 17), more prominent from previous. Minimal patchy signal abnormality within the central cord at the level of C4 (series 4, image 19), more prominent. Probable additional subtle lesion within the right dorsal cord at the level of C6 (series 4, image 29), also more prominent. Findings consistent with history of demyelinating disease, and appear progressed from previous. No abnormal enhancement to suggest active demyelination. Posterior Fossa, vertebral arteries, paraspinal tissues: Craniocervical junction within normal limits. Paraspinous and prevertebral soft tissues are normal. Normal intravascular flow voids seen within the vertebral arteries bilaterally. Disc levels: C2-C3: Mild disc bulge with left-sided uncovertebral hypertrophy. No significant spinal stenosis. Mild left C3 foraminal narrowing. C3-C4: Mild disc bulge with left-sided uncovertebral hypertrophy. No significant  spinal stenosis. Mild left C4 foraminal narrowing. C4-C5: Mild disc bulge with right-sided uncovertebral hypertrophy. Flattening of the ventral thecal sac without significant spinal stenosis. Moderate right C5 foraminal stenosis. C5-C6: Shallow right paracentral disc protrusion indents the right ventral thecal sac (series 5, image 27). Lateral extension into the right neural foramen. Mild spinal stenosis without cord impingement. Moderate right with mild left C6 foraminal narrowing. C6-C7: Shallow broad-based central disc protrusion indents the ventral thecal sac. Mild spinal stenosis or cord impingement. Superimposed uncovertebral hypertrophy with resultant mild left C7 foraminal stenosis. C7-T1:  Unremarkable. MRI THORACIC SPINE FINDINGS Alignment: Vertebral bodies normally aligned with preservation of the normal thoracic kyphosis. No listhesis or subluxation. Vertebrae: Vertebral body height maintained without evidence for acute or chronic fracture. Bone marrow signal intensity within normal limits. Few scattered benign hemangiomata noted, with small atypical hemangioma noted within the T5 vertebral body. No worrisome osseous lesions. No abnormal marrow edema or enhancement. Cord: Signal intensity within the thoracic spinal cord is within normal limits. No definite cord lesions to suggest demyelinating disease. Normal cord caliber and morphology. No abnormal enhancement. Paraspinal soft tissues: Paraspinous soft tissues within normal limits. Partially visualized lungs are clear. Visualized visceral structures unremarkable. Disc levels: No significant disc pathology seen within the thoracic spine. No disc bulge or focal disc protrusion. No significant facet disease. No canal or neural foraminal stenosis. No impingement. IMPRESSION: MRI HEAD IMPRESSION: 1. Patchy multifocal signal changes involving the supratentorial and infratentorial cerebral white matter as above, compatible with provided history of multiple  sclerosis. Overall, changes are mildly progressed relative to previous MRI from 2019. No evidence for active demyelination. 2. No other acute intracranial abnormality. MRI CERVICAL SPINE IMPRESSION: 1. Patchy multifocal cord  signal abnormality involving the cervical spinal cord as detailed above, consistent with history of multiple sclerosis. Overall, appearance appears mildly progressed relative to 2019. No evidence for active demyelination. 2. Degenerative spondylosis at C5-6 and C6-7 with resultant mild spinal stenosis, with moderate right C6 and mild left C7 foraminal narrowing. 3. Mild left C3 and C4 foraminal stenosis, with moderate right C5 foraminal narrowing related to disc bulge and uncovertebral disease. MRI THORACIC SPINE IMPRESSION: Normal MRI of the thoracic spine and spinal cord. No evidence for demyelinating disease. No significant disc pathology, stenosis, or neural impingement. Electronically Signed   By: Rise Mu M.D.   On: 03/31/2019 01:02   Mr Cervical Spine W Or Wo Contrast  Result Date: 03/31/2019 CLINICAL DATA:  Initial evaluation for increasing leg weakness, spasms, blurry vision. History of multiple sclerosis. EXAM: MRI HEAD WITHOUT AND WITH CONTRAST MRI CERVICAL SPINE WITHOUT AND WITH CONTRAST MRI THORACIC SPINE WITHOUT AND WITH CONTRAST TECHNIQUE: Multiplanar, multiecho pulse sequences of the brain and surrounding structures, and cervical spine, to include the craniocervical junction and cervicothoracic junction, as well as the thoracic spine were obtained without and with intravenous contrast. CONTRAST:  34mL GADAVIST GADOBUTROL 1 MMOL/ML IV SOLN COMPARISON:  Previous MRI from 02/05/2018. FINDINGS: MRI HEAD FINDINGS Brain: Cerebral volume stable, and remains within normal limits for age. Multiple scattered patchy T2/FLAIR hyperintensities seen involving the periventricular, deep, and juxta cortical white matter of both cerebral hemispheres, consistent with provided history  of multiple sclerosis. Scattered patchy involvement of the pons and medulla as well as the cerebellum present as well. In comparison with previous MRI, overall appearance has mildly progressed as evidence by a few scattered new and/or more prominent lesions as compared to previous. For example, few small juxta cortical lesions seen within the right parietal region appear to be new from previous (series 15, image 17). Few of the additional pre cystic lesions or also mildly increased in size. No abnormal restricted diffusion or enhancement to suggest active demyelination on today's exam. No evidence for acute or subacute infarct. Gray-white matter differentiation maintained. No encephalomalacia to suggest chronic cortical infarction. No foci of susceptibility artifact to suggest acute or chronic intracranial hemorrhage. No mass lesion, midline shift or mass effect. No hydrocephalus. No extra-axial fluid collection. Pituitary gland suprasellar region normal. Midline structures intact. Vascular: Major intracranial vascular flow voids are maintained. Skull and upper cervical spine: Craniocervical junction within normal limits. Bone marrow signal intensity normal. 19 mm sclerotic lesion at the right frontal scalp noted, indeterminate, but stable from previous, and of doubtful significance. Scalp soft tissues demonstrate no acute finding. Sinuses/Orbits: Globes and orbital soft tissues within normal limits. Paranasal sinuses are clear. No mastoid effusion. Inner ear structures normal. Other: None. MRI CERVICAL SPINE FINDINGS Alignment: Straightening of the normal cervical lordosis. No listhesis. Vertebrae: Vertebral body height maintained without evidence for acute or chronic fracture. Bone marrow signal intensity within normal limits. Benign hemangioma noted within the C7 vertebral body. No other discrete or worrisome osseous lesions. No abnormal marrow edema. Cord: Patchy signal abnormality within the right hemi cord at  the level of C2 (series 4, image 8), not definite seen on previous. Additional patchy signal abnormality within the central cord at the level of C2-3 (series 4, image 11), more prominent as compared to previous. Patchy signal abnormality within the central cord at the level of C3, slightly more prominent (series 4, image 15). Focal signal abnormality within the left dorsal cord at the level of C3-4 (series 4, image  17), more prominent from previous. Minimal patchy signal abnormality within the central cord at the level of C4 (series 4, image 19), more prominent. Probable additional subtle lesion within the right dorsal cord at the level of C6 (series 4, image 29), also more prominent. Findings consistent with history of demyelinating disease, and appear progressed from previous. No abnormal enhancement to suggest active demyelination. Posterior Fossa, vertebral arteries, paraspinal tissues: Craniocervical junction within normal limits. Paraspinous and prevertebral soft tissues are normal. Normal intravascular flow voids seen within the vertebral arteries bilaterally. Disc levels: C2-C3: Mild disc bulge with left-sided uncovertebral hypertrophy. No significant spinal stenosis. Mild left C3 foraminal narrowing. C3-C4: Mild disc bulge with left-sided uncovertebral hypertrophy. No significant spinal stenosis. Mild left C4 foraminal narrowing. C4-C5: Mild disc bulge with right-sided uncovertebral hypertrophy. Flattening of the ventral thecal sac without significant spinal stenosis. Moderate right C5 foraminal stenosis. C5-C6: Shallow right paracentral disc protrusion indents the right ventral thecal sac (series 5, image 27). Lateral extension into the right neural foramen. Mild spinal stenosis without cord impingement. Moderate right with mild left C6 foraminal narrowing. C6-C7: Shallow broad-based central disc protrusion indents the ventral thecal sac. Mild spinal stenosis or cord impingement. Superimposed uncovertebral  hypertrophy with resultant mild left C7 foraminal stenosis. C7-T1:  Unremarkable. MRI THORACIC SPINE FINDINGS Alignment: Vertebral bodies normally aligned with preservation of the normal thoracic kyphosis. No listhesis or subluxation. Vertebrae: Vertebral body height maintained without evidence for acute or chronic fracture. Bone marrow signal intensity within normal limits. Few scattered benign hemangiomata noted, with small atypical hemangioma noted within the T5 vertebral body. No worrisome osseous lesions. No abnormal marrow edema or enhancement. Cord: Signal intensity within the thoracic spinal cord is within normal limits. No definite cord lesions to suggest demyelinating disease. Normal cord caliber and morphology. No abnormal enhancement. Paraspinal soft tissues: Paraspinous soft tissues within normal limits. Partially visualized lungs are clear. Visualized visceral structures unremarkable. Disc levels: No significant disc pathology seen within the thoracic spine. No disc bulge or focal disc protrusion. No significant facet disease. No canal or neural foraminal stenosis. No impingement. IMPRESSION: MRI HEAD IMPRESSION: 1. Patchy multifocal signal changes involving the supratentorial and infratentorial cerebral white matter as above, compatible with provided history of multiple sclerosis. Overall, changes are mildly progressed relative to previous MRI from 2019. No evidence for active demyelination. 2. No other acute intracranial abnormality. MRI CERVICAL SPINE IMPRESSION: 1. Patchy multifocal cord signal abnormality involving the cervical spinal cord as detailed above, consistent with history of multiple sclerosis. Overall, appearance appears mildly progressed relative to 2019. No evidence for active demyelination. 2. Degenerative spondylosis at C5-6 and C6-7 with resultant mild spinal stenosis, with moderate right C6 and mild left C7 foraminal narrowing. 3. Mild left C3 and C4 foraminal stenosis, with  moderate right C5 foraminal narrowing related to disc bulge and uncovertebral disease. MRI THORACIC SPINE IMPRESSION: Normal MRI of the thoracic spine and spinal cord. No evidence for demyelinating disease. No significant disc pathology, stenosis, or neural impingement. Electronically Signed   By: Rise MuBenjamin  McClintock M.D.   On: 03/31/2019 01:02   Mr Thoracic Spine W Wo Contrast  Result Date: 03/31/2019 CLINICAL DATA:  Initial evaluation for increasing leg weakness, spasms, blurry vision. History of multiple sclerosis. EXAM: MRI HEAD WITHOUT AND WITH CONTRAST MRI CERVICAL SPINE WITHOUT AND WITH CONTRAST MRI THORACIC SPINE WITHOUT AND WITH CONTRAST TECHNIQUE: Multiplanar, multiecho pulse sequences of the brain and surrounding structures, and cervical spine, to include the craniocervical junction and cervicothoracic junction, as  well as the thoracic spine were obtained without and with intravenous contrast. CONTRAST:  9mL GADAVIST GADOBUTROL 1 MMOL/ML IV SOLN COMPARISON:  Previous MRI from 02/05/2018. FINDINGS: MRI HEAD FINDINGS Brain: Cerebral volume stable, and remains within normal limits for age. Multiple scattered patchy T2/FLAIR hyperintensities seen involving the periventricular, deep, and juxta cortical white matter of both cerebral hemispheres, consistent with provided history of multiple sclerosis. Scattered patchy involvement of the pons and medulla as well as the cerebellum present as well. In comparison with previous MRI, overall appearance has mildly progressed as evidence by a few scattered new and/or more prominent lesions as compared to previous. For example, few small juxta cortical lesions seen within the right parietal region appear to be new from previous (series 15, image 17). Few of the additional pre cystic lesions or also mildly increased in size. No abnormal restricted diffusion or enhancement to suggest active demyelination on today's exam. No evidence for acute or subacute infarct.  Gray-white matter differentiation maintained. No encephalomalacia to suggest chronic cortical infarction. No foci of susceptibility artifact to suggest acute or chronic intracranial hemorrhage. No mass lesion, midline shift or mass effect. No hydrocephalus. No extra-axial fluid collection. Pituitary gland suprasellar region normal. Midline structures intact. Vascular: Major intracranial vascular flow voids are maintained. Skull and upper cervical spine: Craniocervical junction within normal limits. Bone marrow signal intensity normal. 19 mm sclerotic lesion at the right frontal scalp noted, indeterminate, but stable from previous, and of doubtful significance. Scalp soft tissues demonstrate no acute finding. Sinuses/Orbits: Globes and orbital soft tissues within normal limits. Paranasal sinuses are clear. No mastoid effusion. Inner ear structures normal. Other: None. MRI CERVICAL SPINE FINDINGS Alignment: Straightening of the normal cervical lordosis. No listhesis. Vertebrae: Vertebral body height maintained without evidence for acute or chronic fracture. Bone marrow signal intensity within normal limits. Benign hemangioma noted within the C7 vertebral body. No other discrete or worrisome osseous lesions. No abnormal marrow edema. Cord: Patchy signal abnormality within the right hemi cord at the level of C2 (series 4, image 8), not definite seen on previous. Additional patchy signal abnormality within the central cord at the level of C2-3 (series 4, image 11), more prominent as compared to previous. Patchy signal abnormality within the central cord at the level of C3, slightly more prominent (series 4, image 15). Focal signal abnormality within the left dorsal cord at the level of C3-4 (series 4, image 17), more prominent from previous. Minimal patchy signal abnormality within the central cord at the level of C4 (series 4, image 19), more prominent. Probable additional subtle lesion within the right dorsal cord at  the level of C6 (series 4, image 29), also more prominent. Findings consistent with history of demyelinating disease, and appear progressed from previous. No abnormal enhancement to suggest active demyelination. Posterior Fossa, vertebral arteries, paraspinal tissues: Craniocervical junction within normal limits. Paraspinous and prevertebral soft tissues are normal. Normal intravascular flow voids seen within the vertebral arteries bilaterally. Disc levels: C2-C3: Mild disc bulge with left-sided uncovertebral hypertrophy. No significant spinal stenosis. Mild left C3 foraminal narrowing. C3-C4: Mild disc bulge with left-sided uncovertebral hypertrophy. No significant spinal stenosis. Mild left C4 foraminal narrowing. C4-C5: Mild disc bulge with right-sided uncovertebral hypertrophy. Flattening of the ventral thecal sac without significant spinal stenosis. Moderate right C5 foraminal stenosis. C5-C6: Shallow right paracentral disc protrusion indents the right ventral thecal sac (series 5, image 27). Lateral extension into the right neural foramen. Mild spinal stenosis without cord impingement. Moderate right with mild left  C6 foraminal narrowing. C6-C7: Shallow broad-based central disc protrusion indents the ventral thecal sac. Mild spinal stenosis or cord impingement. Superimposed uncovertebral hypertrophy with resultant mild left C7 foraminal stenosis. C7-T1:  Unremarkable. MRI THORACIC SPINE FINDINGS Alignment: Vertebral bodies normally aligned with preservation of the normal thoracic kyphosis. No listhesis or subluxation. Vertebrae: Vertebral body height maintained without evidence for acute or chronic fracture. Bone marrow signal intensity within normal limits. Few scattered benign hemangiomata noted, with small atypical hemangioma noted within the T5 vertebral body. No worrisome osseous lesions. No abnormal marrow edema or enhancement. Cord: Signal intensity within the thoracic spinal cord is within normal  limits. No definite cord lesions to suggest demyelinating disease. Normal cord caliber and morphology. No abnormal enhancement. Paraspinal soft tissues: Paraspinous soft tissues within normal limits. Partially visualized lungs are clear. Visualized visceral structures unremarkable. Disc levels: No significant disc pathology seen within the thoracic spine. No disc bulge or focal disc protrusion. No significant facet disease. No canal or neural foraminal stenosis. No impingement. IMPRESSION: MRI HEAD IMPRESSION: 1. Patchy multifocal signal changes involving the supratentorial and infratentorial cerebral white matter as above, compatible with provided history of multiple sclerosis. Overall, changes are mildly progressed relative to previous MRI from 2019. No evidence for active demyelination. 2. No other acute intracranial abnormality. MRI CERVICAL SPINE IMPRESSION: 1. Patchy multifocal cord signal abnormality involving the cervical spinal cord as detailed above, consistent with history of multiple sclerosis. Overall, appearance appears mildly progressed relative to 2019. No evidence for active demyelination. 2. Degenerative spondylosis at C5-6 and C6-7 with resultant mild spinal stenosis, with moderate right C6 and mild left C7 foraminal narrowing. 3. Mild left C3 and C4 foraminal stenosis, with moderate right C5 foraminal narrowing related to disc bulge and uncovertebral disease. MRI THORACIC SPINE IMPRESSION: Normal MRI of the thoracic spine and spinal cord. No evidence for demyelinating disease. No significant disc pathology, stenosis, or neural impingement. Electronically Signed   By: Jeannine Boga M.D.   On: 03/31/2019 01:02    Time Spent in minutes  25   Treasure Ingrum M.D on 04/01/2019 at 10:56 AM  Between 7am to 7pm - Pager - [phone]  After 7pm go to [email or web address] - password [facility]  [facility] -  Office  [phone]

## 2019-04-01 NOTE — Progress Notes (Signed)
Inpatient Rehab Admissions:  Inpatient Rehab Consult received.  I met with pt at the bedside for rehabilitation assessment and to discuss goals and expectations of an inpatient rehab admission. Feel pt is a great candidate for CIR. Spoke with pt's husband who can provide intermittent assist/supervision at DC. Feel pt is a great CIR candidate. Noted it appears the patient will have one more day of Solumedrol? Will follow up tomorrow and begin insurance authorization process for possible admit.   Jhonnie Garner, OTR/L  Rehab Admissions Coordinator  [phone] 04/01/2019 5:26 PM

## 2019-04-01 NOTE — Evaluation (Signed)
Clinical/Bedside Swallow Evaluation Patient Details  Name: Sharon Odom MRN: [id number] Date of Birth: 04-Jul-1970  Today's Date: 04/01/2019 Time: SLP Start Time (ACUTE ONLY): 0945 SLP Stop Time (ACUTE ONLY): 1008 SLP Time Calculation (min) (ACUTE ONLY): 23 min  Past Medical History:  Past Medical History:  Diagnosis Date  . Allergies   . Anxiety   . Arthritis   . Asthma   . Constipation   . Depression   . GERD (gastroesophageal reflux disease)   . Headache   . History of hiatal hernia   . Multiple sclerosis (HCC)   . Vision abnormalities   . Wears glasses    Past Surgical History:  Past Surgical History:  Procedure Laterality Date  . EXCISION MORTON'S NEUROMA Right   . fallopian tube removal    . INTRATHECAL PUMP IMPLANT Right 02/18/2019   Procedure: INTRATHECAL PUMP IMPLANT;  Surgeon: Odette Fraction, MD;  Location: [facility] OR;  Service: Neurosurgery;  Laterality: Right;  INTRATHECAL PUMP IMPLANT  . WISDOM TOOTH EXTRACTION     HPI:  Sharon Odom is a 48 year old female with past medical history of multiple sclerosis, anxiety, arthritis, asthma, GERD presenting to the emergency department for increasing leg weakness, spasms, blurry vision. Also reprots progressive decline in swallowing since March. SHe has had prior swallowing therapy and MBS which did show delayed swallow initiation and instances of sensed penetration/aspiraiton. Pt used an oral hold indepdendently to assist in early airway protection with success and has carried over this strategy. She was briefly reommended to try nectar thick liquids but then reassessed and determine this was not a successful texture for her. She denies any pna.    Assessment / Plan / Recommendation Clinical Impression  Pt with glottal fry vocal quality with decreased pitch change reports she typically suffers from difficulty initiating a swallow. She successfully uses an oral holding strategy that prevents choking episodes but now is severely prolonged,  with hours sometimes needed to complete a meal. She reports this problem has worsened since March. She often finds that difficulty swallowing is worse in the pm. However pt reports she recieved a dose of Solumedrol this am, and her ability to initaite a swallow is WNL. SLP observed pt take careful single sips with upright positition with immediate and automatic swallow reponse, no coughing. She is able to masticate solids, which again she does carefully at an appropriately slow rate. Pt recommeded to continue current diet and SLP will f/u for tolerance over course of admission given that function can fluctaute per her report. Pt likely will stay for 3 three days and SLP will regularly check in for tolerance.  SLP Visit Diagnosis: Dysphagia, unspecified (R13.10)    Aspiration Risk  Mild aspiration risk    Diet Recommendation Regular;Thin liquid   Liquid Administration via: Cup;Straw Medication Administration: Whole meds with puree Supervision: Patient able to self feed Compensations: Slow rate;Small sips/bites Postural Changes: Seated upright at 90 degrees    Other  Recommendations     Follow up Recommendations None      Frequency and Duration            Prognosis        Swallow Study   General HPI: Sharon Odom is a 48 year old female with past medical history of multiple sclerosis, anxiety, arthritis, asthma, GERD presenting to the emergency department for increasing leg weakness, spasms, blurry vision. Also reprots progressive decline in swallowing since March. SHe has had prior swallowing therapy and MBS which did show delayed swallow initiation and  instances of sensed penetration/aspiraiton. Pt used an oral hold indepdendently to assist in early airway protection with success and has carried over this strategy. She was briefly reommended to try nectar thick liquids but then reassessed and determine this was not a successful texture for her. She denies any pna.  Type of Study: Bedside  Swallow Evaluation Previous Swallow Assessment: see HPI Diet Prior to this Study: Regular;Thin liquids Temperature Spikes Noted: No Respiratory Status: Room air History of Recent Intubation: No Behavior/Cognition: Alert;Cooperative;Pleasant mood Oral Cavity Assessment: Within Functional Limits Oral Care Completed by SLP: No Oral Cavity - Dentition: Adequate natural dentition Vision: Functional for self-feeding Self-Feeding Abilities: Able to feed self Patient Positioning: Upright in bed Baseline Vocal Quality: Normal Volitional Cough: Weak    Oral/Motor/Sensory Function Overall Oral Motor/Sensory Function: Within functional limits   Ice Chips Ice chips: Not tested   Thin Liquid Thin Liquid: Within functional limits Presentation: Straw    Nectar Thick Nectar Thick Liquid: Not tested   Honey Thick Honey Thick Liquid: Not tested   Puree Puree: Within functional limits Presentation: Spoon   Solid     Solid: Within functional limits Presentation: Sharon Loreda Silverio, MA [place] Pager [phone] Office [phone]  Lynann Beaver 04/01/2019,10:43 AM

## 2019-04-01 NOTE — Progress Notes (Signed)
PROGRESS NOTE                                                                                                                                                                                                             Patient Demographics:    Sharon Odom, is a 48 y.o. female, DOB - 04-09-1971, UJW:[id number]  Admit date - 03/30/2019   Admitting Physician Leona Singleton, MD  Outpatient Primary MD for the patient is Annita Brod, MD  LOS - 1  Outpatient Specialists: Dr Ollen Bowl ( neurosurgery) Dr Epimenio Foot ( neurology)  Chief Complaint  Patient presents with   Weakness       Brief Narrative   48 y/o female with hx of multiple sclerosis, GERD, depression and asthma who had an intrathecal baclofen pump inserted by neurosurgery on 9/23 presented to ED with increasing muscle weakness, spasms and blurry vision. Also reported being more lethargic with hoarse voice for past 48 hrs.    Subjective:   Reports muscle spasms and UE weakness showing some improvement   Assessment  & Plan :    Active Problems: Acute worsening motor function Suspect due to adverse effect of baclofen given progressive symptoms since insertion of  the pump 5 weeks back.. Neurology consult appreciated. MRI brain, cervical and thoracic spine shows no acute demyelinating lesion.  neurosurgery Dr Ollen Bowl consulted. adjusted baclofen pump and resumed at lower dose.  continue neurochecks, PT/OT.    Multiple sclerosis (HCC) No acute or progressive enhancement/ demyelination on MRI. neurohospitalist d/w pts neurologist Dr Epimenio Foot who recommended that although imaging was negative for enhancment, patient having gait disturbance may benefit from 2 does of IV solumedrol. ( receiving yday and today. )She is on  on Lemtrada for disease-modifying treatment.  was seen by ophthalmologist recently for blurry vision and did not find anything acute, possibly symptoms  are due to baclofen.   Acute dysphagia  reports symptoms past 2-3 weeks with occasional choking and delayed swallowing. Ordered SLP eval. Continue PPI for GERD.   Asthma  stable. Home inhaler  Chronic depression  resume home meds   constipation  reports no BM for almost a week. Added miralax.   Code Status : full code  Family Communication  : none  Disposition Plan  :home pending clinical improvement, PT eva. Possibly in next 48 hrs   Barriers  For Discharge : active symptoms  Consults  :  Neurology/ neurosx Procedures  : MRI brain, cervical and thoracic spine  DVT Prophylaxis  :  Lovenox -   Lab Results  Component Value Date   PLT 252 04/01/2019    Antibiotics  :    Anti-infectives (From admission, onward)   None        Objective:   Vitals:   03/31/19 1524 03/31/19 1524 03/31/19 2035 04/01/19 0640  BP:  124/79 109/73 115/74  Pulse:  89 72 75  Resp:  18 14 14   Temp: 98.1 F (36.7 C) 98.1 F (36.7 C) 98.7 F (37.1 C) 98.8 F (37.1 C)  TempSrc: Oral  Oral Oral  SpO2:  94% 92% 98%  Weight: 89 kg     Height: 5\' 9"  (1.753 m)       Wt Readings from Last 3 Encounters:  03/31/19 89 kg  02/18/19 93 kg  04/14/18 92.8 kg     Intake/Output Summary (Last 24 hours) at 04/01/2019 1020 Last data filed at 04/01/2019 0900 Gross per 24 hour  Intake 498.16 ml  Output --  Net 498.16 ml    Physical exam  in better mood, NAD  HEENT: moist mucosa, supple neck  chest: clear b/l CVS: NS1&S2, no murmurs GI: soft, NT, ND, BS+ Musculoskeletal: warn, no edema  CNS: Alert and oriented, improved strength in UE, 4/5 power in LLE ( reports chronic)     Data Review:    CBC Recent Labs  Lab 03/30/19 1803 04/01/19 0227  WBC 11.0* 6.5  HGB 15.5* 15.0  HCT 48.9* 44.9  PLT 271 252  MCV 102.5* 98.0  MCH 32.5 32.8  MCHC 31.7 33.4  RDW 12.5 12.5    Chemistries  Recent Labs  Lab 03/30/19 1803 03/31/19 0551 04/01/19 0227  NA 136  --  140  K 4.0  --  4.3   CL 101  --  111  CO2 24  --  21*  GLUCOSE 99  --  201*  BUN 13  --  15  CREATININE 0.86  --  0.89  CALCIUM 9.6  --  9.4  MG  --  2.2  --   AST  --   --  15  ALT  --   --  19  ALKPHOS  --   --  82  BILITOT  --   --  0.4   ------------------------------------------------------------------------------------------------------------------ No results for input(s): CHOL, HDL, LDLCALC, TRIG, CHOLHDL, LDLDIRECT in the last 72 hours.  No results found for: HGBA1C ------------------------------------------------------------------------------------------------------------------ Recent Labs    03/31/19 0551  TSH 1.184   ------------------------------------------------------------------------------------------------------------------ No results for input(s): VITAMINB12, FOLATE, FERRITIN, TIBC, IRON, RETICCTPCT in the last 72 hours.  Coagulation profile No results for input(s): INR, PROTIME in the last 168 hours.  No results for input(s): DDIMER in the last 72 hours.  Cardiac Enzymes No results for input(s): CKMB, TROPONINI, MYOGLOBIN in the last 168 hours.  Invalid input(s): CK ------------------------------------------------------------------------------------------------------------------ No results found for: BNP  Inpatient Medications  Scheduled Meds:  acetaminophen  1,000 mg Oral Q8H   baclofen  20 mg Oral TID   buPROPion  300 mg Oral Daily   cholecalciferol  5,000 Units Oral Daily   dalfampridine  10 mg Oral BID   DULoxetine  30 mg Oral BID   enoxaparin (LOVENOX) injection  40 mg Subcutaneous Q24H   famotidine  20 mg Oral QHS   montelukast  10 mg Oral Daily   oxybutynin  10  mg Oral Daily   pantoprazole  40 mg Oral Daily   polyethylene glycol  17 g Oral Daily   sodium chloride flush  3 mL Intravenous Once   vitamin B-12  2,500 mcg Oral Daily   Continuous Infusions:  sodium chloride     methylPREDNISolone (SOLU-MEDROL) injection Stopped (03/31/19 2023)    PRN Meds:.albuterol, diazepam, ibuprofen, ondansetron, oxyCODONE-acetaminophen **AND** oxyCODONE  Micro Results Recent Results (from the past 240 hour(s))  SARS CORONAVIRUS 2 (TAT 6-24 HRS) Nasopharyngeal Nasopharyngeal Swab     Status: None   Collection Time: 03/30/19  7:27 PM   Specimen: Nasopharyngeal Swab  Result Value Ref Range Status   SARS Coronavirus 2 NEGATIVE NEGATIVE Final    Comment: (NOTE) SARS-CoV-2 target nucleic acids are NOT DETECTED. The SARS-CoV-2 RNA is generally detectable in upper and lower respiratory specimens during the acute phase of infection. Negative results do not preclude SARS-CoV-2 infection, do not rule out co-infections with other pathogens, and should not be used as the sole basis for treatment or other patient management decisions. Negative results must be combined with clinical observations, patient history, and epidemiological information. The expected result is Negative. Fact Sheet for Patients: HairSlick.no Fact Sheet for Healthcare Providers: quierodirigir.com This test is not yet approved or cleared by the [place] FDA and  has been authorized for detection and/or diagnosis of SARS-CoV-2 by FDA under an Emergency Use Authorization (EUA). This EUA will remain  in effect (meaning this test can be used) for the duration of the COVID-19 declaration under Section 56 4(b)(1) of the Act, 21 U.S.C. section 360bbb-3(b)(1), unless the authorization is terminated or revoked sooner. Performed at [facility], [address] [place], [place] [address]     Radiology Reports Mr Laqueta Jean And Wo Contrast  Result Date: 03/31/2019 CLINICAL DATA:  Initial evaluation for increasing leg weakness, spasms, blurry vision. History of multiple sclerosis. EXAM: MRI HEAD WITHOUT AND WITH CONTRAST MRI CERVICAL SPINE WITHOUT AND WITH CONTRAST MRI THORACIC SPINE WITHOUT AND WITH CONTRAST TECHNIQUE:  Multiplanar, multiecho pulse sequences of the brain and surrounding structures, and cervical spine, to include the craniocervical junction and cervicothoracic junction, as well as the thoracic spine were obtained without and with intravenous contrast. CONTRAST:  9mL GADAVIST GADOBUTROL 1 MMOL/ML IV SOLN COMPARISON:  Previous MRI from 02/05/2018. FINDINGS: MRI HEAD FINDINGS Brain: Cerebral volume stable, and remains within normal limits for age. Multiple scattered patchy T2/FLAIR hyperintensities seen involving the periventricular, deep, and juxta cortical white matter of both cerebral hemispheres, consistent with provided history of multiple sclerosis. Scattered patchy involvement of the pons and medulla as well as the cerebellum present as well. In comparison with previous MRI, overall appearance has mildly progressed as evidence by a few scattered new and/or more prominent lesions as compared to previous. For example, few small juxta cortical lesions seen within the right parietal region appear to be new from previous (series 15, image 17). Few of the additional pre cystic lesions or also mildly increased in size. No abnormal restricted diffusion or enhancement to suggest active demyelination on today's exam. No evidence for acute or subacute infarct. Gray-white matter differentiation maintained. No encephalomalacia to suggest chronic cortical infarction. No foci of susceptibility artifact to suggest acute or chronic intracranial hemorrhage. No mass lesion, midline shift or mass effect. No hydrocephalus. No extra-axial fluid collection. Pituitary gland suprasellar region normal. Midline structures intact. Vascular: Major intracranial vascular flow voids are maintained. Skull and upper cervical spine: Craniocervical junction within normal limits. Bone  marrow signal intensity normal. 19 mm sclerotic lesion at the right frontal scalp noted, indeterminate, but stable from previous, and of doubtful significance. Scalp  soft tissues demonstrate no acute finding. Sinuses/Orbits: Globes and orbital soft tissues within normal limits. Paranasal sinuses are clear. No mastoid effusion. Inner ear structures normal. Other: None. MRI CERVICAL SPINE FINDINGS Alignment: Straightening of the normal cervical lordosis. No listhesis. Vertebrae: Vertebral body height maintained without evidence for acute or chronic fracture. Bone marrow signal intensity within normal limits. Benign hemangioma noted within the C7 vertebral body. No other discrete or worrisome osseous lesions. No abnormal marrow edema. Cord: Patchy signal abnormality within the right hemi cord at the level of C2 (series 4, image 8), not definite seen on previous. Additional patchy signal abnormality within the central cord at the level of C2-3 (series 4, image 11), more prominent as compared to previous. Patchy signal abnormality within the central cord at the level of C3, slightly more prominent (series 4, image 15). Focal signal abnormality within the left dorsal cord at the level of C3-4 (series 4, image 17), more prominent from previous. Minimal patchy signal abnormality within the central cord at the level of C4 (series 4, image 19), more prominent. Probable additional subtle lesion within the right dorsal cord at the level of C6 (series 4, image 29), also more prominent. Findings consistent with history of demyelinating disease, and appear progressed from previous. No abnormal enhancement to suggest active demyelination. Posterior Fossa, vertebral arteries, paraspinal tissues: Craniocervical junction within normal limits. Paraspinous and prevertebral soft tissues are normal. Normal intravascular flow voids seen within the vertebral arteries bilaterally. Disc levels: C2-C3: Mild disc bulge with left-sided uncovertebral hypertrophy. No significant spinal stenosis. Mild left C3 foraminal narrowing. C3-C4: Mild disc bulge with left-sided uncovertebral hypertrophy. No significant  spinal stenosis. Mild left C4 foraminal narrowing. C4-C5: Mild disc bulge with right-sided uncovertebral hypertrophy. Flattening of the ventral thecal sac without significant spinal stenosis. Moderate right C5 foraminal stenosis. C5-C6: Shallow right paracentral disc protrusion indents the right ventral thecal sac (series 5, image 27). Lateral extension into the right neural foramen. Mild spinal stenosis without cord impingement. Moderate right with mild left C6 foraminal narrowing. C6-C7: Shallow broad-based central disc protrusion indents the ventral thecal sac. Mild spinal stenosis or cord impingement. Superimposed uncovertebral hypertrophy with resultant mild left C7 foraminal stenosis. C7-T1:  Unremarkable. MRI THORACIC SPINE FINDINGS Alignment: Vertebral bodies normally aligned with preservation of the normal thoracic kyphosis. No listhesis or subluxation. Vertebrae: Vertebral body height maintained without evidence for acute or chronic fracture. Bone marrow signal intensity within normal limits. Few scattered benign hemangiomata noted, with small atypical hemangioma noted within the T5 vertebral body. No worrisome osseous lesions. No abnormal marrow edema or enhancement. Cord: Signal intensity within the thoracic spinal cord is within normal limits. No definite cord lesions to suggest demyelinating disease. Normal cord caliber and morphology. No abnormal enhancement. Paraspinal soft tissues: Paraspinous soft tissues within normal limits. Partially visualized lungs are clear. Visualized visceral structures unremarkable. Disc levels: No significant disc pathology seen within the thoracic spine. No disc bulge or focal disc protrusion. No significant facet disease. No canal or neural foraminal stenosis. No impingement. IMPRESSION: MRI HEAD IMPRESSION: 1. Patchy multifocal signal changes involving the supratentorial and infratentorial cerebral white matter as above, compatible with provided history of multiple  sclerosis. Overall, changes are mildly progressed relative to previous MRI from 2019. No evidence for active demyelination. 2. No other acute intracranial abnormality. MRI CERVICAL SPINE IMPRESSION: 1. Patchy multifocal  cord signal abnormality involving the cervical spinal cord as detailed above, consistent with history of multiple sclerosis. Overall, appearance appears mildly progressed relative to 2019. No evidence for active demyelination. 2. Degenerative spondylosis at C5-6 and C6-7 with resultant mild spinal stenosis, with moderate right C6 and mild left C7 foraminal narrowing. 3. Mild left C3 and C4 foraminal stenosis, with moderate right C5 foraminal narrowing related to disc bulge and uncovertebral disease. MRI THORACIC SPINE IMPRESSION: Normal MRI of the thoracic spine and spinal cord. No evidence for demyelinating disease. No significant disc pathology, stenosis, or neural impingement. Electronically Signed   By: Rise MuBenjamin  McClintock M.D.   On: 03/31/2019 01:02   Mr Cervical Spine W Or Wo Contrast  Result Date: 03/31/2019 CLINICAL DATA:  Initial evaluation for increasing leg weakness, spasms, blurry vision. History of multiple sclerosis. EXAM: MRI HEAD WITHOUT AND WITH CONTRAST MRI CERVICAL SPINE WITHOUT AND WITH CONTRAST MRI THORACIC SPINE WITHOUT AND WITH CONTRAST TECHNIQUE: Multiplanar, multiecho pulse sequences of the brain and surrounding structures, and cervical spine, to include the craniocervical junction and cervicothoracic junction, as well as the thoracic spine were obtained without and with intravenous contrast. CONTRAST:  9mL GADAVIST GADOBUTROL 1 MMOL/ML IV SOLN COMPARISON:  Previous MRI from 02/05/2018. FINDINGS: MRI HEAD FINDINGS Brain: Cerebral volume stable, and remains within normal limits for age. Multiple scattered patchy T2/FLAIR hyperintensities seen involving the periventricular, deep, and juxta cortical white matter of both cerebral hemispheres, consistent with provided history  of multiple sclerosis. Scattered patchy involvement of the pons and medulla as well as the cerebellum present as well. In comparison with previous MRI, overall appearance has mildly progressed as evidence by a few scattered new and/or more prominent lesions as compared to previous. For example, few small juxta cortical lesions seen within the right parietal region appear to be new from previous (series 15, image 17). Few of the additional pre cystic lesions or also mildly increased in size. No abnormal restricted diffusion or enhancement to suggest active demyelination on today's exam. No evidence for acute or subacute infarct. Gray-white matter differentiation maintained. No encephalomalacia to suggest chronic cortical infarction. No foci of susceptibility artifact to suggest acute or chronic intracranial hemorrhage. No mass lesion, midline shift or mass effect. No hydrocephalus. No extra-axial fluid collection. Pituitary gland suprasellar region normal. Midline structures intact. Vascular: Major intracranial vascular flow voids are maintained. Skull and upper cervical spine: Craniocervical junction within normal limits. Bone marrow signal intensity normal. 19 mm sclerotic lesion at the right frontal scalp noted, indeterminate, but stable from previous, and of doubtful significance. Scalp soft tissues demonstrate no acute finding. Sinuses/Orbits: Globes and orbital soft tissues within normal limits. Paranasal sinuses are clear. No mastoid effusion. Inner ear structures normal. Other: None. MRI CERVICAL SPINE FINDINGS Alignment: Straightening of the normal cervical lordosis. No listhesis. Vertebrae: Vertebral body height maintained without evidence for acute or chronic fracture. Bone marrow signal intensity within normal limits. Benign hemangioma noted within the C7 vertebral body. No other discrete or worrisome osseous lesions. No abnormal marrow edema. Cord: Patchy signal abnormality within the right hemi cord at  the level of C2 (series 4, image 8), not definite seen on previous. Additional patchy signal abnormality within the central cord at the level of C2-3 (series 4, image 11), more prominent as compared to previous. Patchy signal abnormality within the central cord at the level of C3, slightly more prominent (series 4, image 15). Focal signal abnormality within the left dorsal cord at the level of C3-4 (series 4,  image 17), more prominent from previous. Minimal patchy signal abnormality within the central cord at the level of C4 (series 4, image 19), more prominent. Probable additional subtle lesion within the right dorsal cord at the level of C6 (series 4, image 29), also more prominent. Findings consistent with history of demyelinating disease, and appear progressed from previous. No abnormal enhancement to suggest active demyelination. Posterior Fossa, vertebral arteries, paraspinal tissues: Craniocervical junction within normal limits. Paraspinous and prevertebral soft tissues are normal. Normal intravascular flow voids seen within the vertebral arteries bilaterally. Disc levels: C2-C3: Mild disc bulge with left-sided uncovertebral hypertrophy. No significant spinal stenosis. Mild left C3 foraminal narrowing. C3-C4: Mild disc bulge with left-sided uncovertebral hypertrophy. No significant spinal stenosis. Mild left C4 foraminal narrowing. C4-C5: Mild disc bulge with right-sided uncovertebral hypertrophy. Flattening of the ventral thecal sac without significant spinal stenosis. Moderate right C5 foraminal stenosis. C5-C6: Shallow right paracentral disc protrusion indents the right ventral thecal sac (series 5, image 27). Lateral extension into the right neural foramen. Mild spinal stenosis without cord impingement. Moderate right with mild left C6 foraminal narrowing. C6-C7: Shallow broad-based central disc protrusion indents the ventral thecal sac. Mild spinal stenosis or cord impingement. Superimposed uncovertebral  hypertrophy with resultant mild left C7 foraminal stenosis. C7-T1:  Unremarkable. MRI THORACIC SPINE FINDINGS Alignment: Vertebral bodies normally aligned with preservation of the normal thoracic kyphosis. No listhesis or subluxation. Vertebrae: Vertebral body height maintained without evidence for acute or chronic fracture. Bone marrow signal intensity within normal limits. Few scattered benign hemangiomata noted, with small atypical hemangioma noted within the T5 vertebral body. No worrisome osseous lesions. No abnormal marrow edema or enhancement. Cord: Signal intensity within the thoracic spinal cord is within normal limits. No definite cord lesions to suggest demyelinating disease. Normal cord caliber and morphology. No abnormal enhancement. Paraspinal soft tissues: Paraspinous soft tissues within normal limits. Partially visualized lungs are clear. Visualized visceral structures unremarkable. Disc levels: No significant disc pathology seen within the thoracic spine. No disc bulge or focal disc protrusion. No significant facet disease. No canal or neural foraminal stenosis. No impingement. IMPRESSION: MRI HEAD IMPRESSION: 1. Patchy multifocal signal changes involving the supratentorial and infratentorial cerebral white matter as above, compatible with provided history of multiple sclerosis. Overall, changes are mildly progressed relative to previous MRI from 2019. No evidence for active demyelination. 2. No other acute intracranial abnormality. MRI CERVICAL SPINE IMPRESSION: 1. Patchy multifocal cord signal abnormality involving the cervical spinal cord as detailed above, consistent with history of multiple sclerosis. Overall, appearance appears mildly progressed relative to 2019. No evidence for active demyelination. 2. Degenerative spondylosis at C5-6 and C6-7 with resultant mild spinal stenosis, with moderate right C6 and mild left C7 foraminal narrowing. 3. Mild left C3 and C4 foraminal stenosis, with  moderate right C5 foraminal narrowing related to disc bulge and uncovertebral disease. MRI THORACIC SPINE IMPRESSION: Normal MRI of the thoracic spine and spinal cord. No evidence for demyelinating disease. No significant disc pathology, stenosis, or neural impingement. Electronically Signed   By: Rise Mu M.D.   On: 03/31/2019 01:02   Mr Thoracic Spine W Wo Contrast  Result Date: 03/31/2019 CLINICAL DATA:  Initial evaluation for increasing leg weakness, spasms, blurry vision. History of multiple sclerosis. EXAM: MRI HEAD WITHOUT AND WITH CONTRAST MRI CERVICAL SPINE WITHOUT AND WITH CONTRAST MRI THORACIC SPINE WITHOUT AND WITH CONTRAST TECHNIQUE: Multiplanar, multiecho pulse sequences of the brain and surrounding structures, and cervical spine, to include the craniocervical junction and cervicothoracic junction,  as well as the thoracic spine were obtained without and with intravenous contrast. CONTRAST:  9mL GADAVIST GADOBUTROL 1 MMOL/ML IV SOLN COMPARISON:  Previous MRI from 02/05/2018. FINDINGS: MRI HEAD FINDINGS Brain: Cerebral volume stable, and remains within normal limits for age. Multiple scattered patchy T2/FLAIR hyperintensities seen involving the periventricular, deep, and juxta cortical white matter of both cerebral hemispheres, consistent with provided history of multiple sclerosis. Scattered patchy involvement of the pons and medulla as well as the cerebellum present as well. In comparison with previous MRI, overall appearance has mildly progressed as evidence by a few scattered new and/or more prominent lesions as compared to previous. For example, few small juxta cortical lesions seen within the right parietal region appear to be new from previous (series 15, image 17). Few of the additional pre cystic lesions or also mildly increased in size. No abnormal restricted diffusion or enhancement to suggest active demyelination on today's exam. No evidence for acute or subacute infarct.  Gray-white matter differentiation maintained. No encephalomalacia to suggest chronic cortical infarction. No foci of susceptibility artifact to suggest acute or chronic intracranial hemorrhage. No mass lesion, midline shift or mass effect. No hydrocephalus. No extra-axial fluid collection. Pituitary gland suprasellar region normal. Midline structures intact. Vascular: Major intracranial vascular flow voids are maintained. Skull and upper cervical spine: Craniocervical junction within normal limits. Bone marrow signal intensity normal. 19 mm sclerotic lesion at the right frontal scalp noted, indeterminate, but stable from previous, and of doubtful significance. Scalp soft tissues demonstrate no acute finding. Sinuses/Orbits: Globes and orbital soft tissues within normal limits. Paranasal sinuses are clear. No mastoid effusion. Inner ear structures normal. Other: None. MRI CERVICAL SPINE FINDINGS Alignment: Straightening of the normal cervical lordosis. No listhesis. Vertebrae: Vertebral body height maintained without evidence for acute or chronic fracture. Bone marrow signal intensity within normal limits. Benign hemangioma noted within the C7 vertebral body. No other discrete or worrisome osseous lesions. No abnormal marrow edema. Cord: Patchy signal abnormality within the right hemi cord at the level of C2 (series 4, image 8), not definite seen on previous. Additional patchy signal abnormality within the central cord at the level of C2-3 (series 4, image 11), more prominent as compared to previous. Patchy signal abnormality within the central cord at the level of C3, slightly more prominent (series 4, image 15). Focal signal abnormality within the left dorsal cord at the level of C3-4 (series 4, image 17), more prominent from previous. Minimal patchy signal abnormality within the central cord at the level of C4 (series 4, image 19), more prominent. Probable additional subtle lesion within the right dorsal cord at  the level of C6 (series 4, image 29), also more prominent. Findings consistent with history of demyelinating disease, and appear progressed from previous. No abnormal enhancement to suggest active demyelination. Posterior Fossa, vertebral arteries, paraspinal tissues: Craniocervical junction within normal limits. Paraspinous and prevertebral soft tissues are normal. Normal intravascular flow voids seen within the vertebral arteries bilaterally. Disc levels: C2-C3: Mild disc bulge with left-sided uncovertebral hypertrophy. No significant spinal stenosis. Mild left C3 foraminal narrowing. C3-C4: Mild disc bulge with left-sided uncovertebral hypertrophy. No significant spinal stenosis. Mild left C4 foraminal narrowing. C4-C5: Mild disc bulge with right-sided uncovertebral hypertrophy. Flattening of the ventral thecal sac without significant spinal stenosis. Moderate right C5 foraminal stenosis. C5-C6: Shallow right paracentral disc protrusion indents the right ventral thecal sac (series 5, image 27). Lateral extension into the right neural foramen. Mild spinal stenosis without cord impingement. Moderate right with mild  left C6 foraminal narrowing. C6-C7: Shallow broad-based central disc protrusion indents the ventral thecal sac. Mild spinal stenosis or cord impingement. Superimposed uncovertebral hypertrophy with resultant mild left C7 foraminal stenosis. C7-T1:  Unremarkable. MRI THORACIC SPINE FINDINGS Alignment: Vertebral bodies normally aligned with preservation of the normal thoracic kyphosis. No listhesis or subluxation. Vertebrae: Vertebral body height maintained without evidence for acute or chronic fracture. Bone marrow signal intensity within normal limits. Few scattered benign hemangiomata noted, with small atypical hemangioma noted within the T5 vertebral body. No worrisome osseous lesions. No abnormal marrow edema or enhancement. Cord: Signal intensity within the thoracic spinal cord is within normal  limits. No definite cord lesions to suggest demyelinating disease. Normal cord caliber and morphology. No abnormal enhancement. Paraspinal soft tissues: Paraspinous soft tissues within normal limits. Partially visualized lungs are clear. Visualized visceral structures unremarkable. Disc levels: No significant disc pathology seen within the thoracic spine. No disc bulge or focal disc protrusion. No significant facet disease. No canal or neural foraminal stenosis. No impingement. IMPRESSION: MRI HEAD IMPRESSION: 1. Patchy multifocal signal changes involving the supratentorial and infratentorial cerebral white matter as above, compatible with provided history of multiple sclerosis. Overall, changes are mildly progressed relative to previous MRI from 2019. No evidence for active demyelination. 2. No other acute intracranial abnormality. MRI CERVICAL SPINE IMPRESSION: 1. Patchy multifocal cord signal abnormality involving the cervical spinal cord as detailed above, consistent with history of multiple sclerosis. Overall, appearance appears mildly progressed relative to 2019. No evidence for active demyelination. 2. Degenerative spondylosis at C5-6 and C6-7 with resultant mild spinal stenosis, with moderate right C6 and mild left C7 foraminal narrowing. 3. Mild left C3 and C4 foraminal stenosis, with moderate right C5 foraminal narrowing related to disc bulge and uncovertebral disease. MRI THORACIC SPINE IMPRESSION: Normal MRI of the thoracic spine and spinal cord. No evidence for demyelinating disease. No significant disc pathology, stenosis, or neural impingement. Electronically Signed   By: Jeannine Boga M.D.   On: 03/31/2019 01:02    Time Spent in minutes  25   Tinesha Siegrist M.D on 04/01/2019 at 10:20 AM  Between 7am to 7pm - Pager - [phone]  After 7pm go to [email or web address] - password [facility]  [facility] -  Office  [phone]

## 2019-04-01 NOTE — Progress Notes (Signed)
Occupational Therapy Evaluation Patient Details Name: Sharon Odom MRN: [id number] DOB: 03/26/1971 Today's Date: 04/01/2019    History of Present Illness Pt is a 48 y.o. female admitted 03/30/19 with increasing muscle weakness, spasms and blurry vision. Of note, pt had intrathecal baclofen pump inserted 02/18/19. MRI brain, cervical and thoracic spine shows no acute demyelinating lesion. Other PMH includes multiple sclerosis, depression, asthma.   Clinical Impression   PTA, pt was living at home with her husband, and reports she was independent with ADL/IADL and modified independent with functional mobility at rollator level. Pt reports he current rollator is broken. Pt currently requires minA for grooming due to limited functional use of LUE. Pt requires minA+2 for safety for stand-pivot toward the right. Due to decline in current level of function, pt would benefit from acute OT to address established goals to facilitate safe D/C to venue listed below. At this time, recommend CIR follow-up. Will continue to follow acutely.     Follow Up Recommendations  CIR    Equipment Recommendations  3 in 1 bedside commode(rollator)    Recommendations for Other Services       Precautions / Restrictions Precautions Precautions: Fall Restrictions Weight Bearing Restrictions: No      Mobility Bed Mobility Overal bed mobility: Needs Assistance Bed Mobility: Supine to Sit     Supine to sit: Mod assist;HOB elevated     General bed mobility comments: pt managed BLE, required modA to progress trunk to upright position  Transfers Overall transfer level: Needs assistance Equipment used: Rolling walker (2 wheeled) Transfers: Sit to/from [facility] Sit to Stand: Min assist;+2 safety/equipment Stand pivot transfers: Min assist;+2 safety/equipment       General transfer comment: stand-pivot to the right:pt required minA from 1person with other person present for safety and  equipment management:pt slid LLE along floor, unable to step with LLE    Balance Overall balance assessment: Needs assistance Sitting-balance support: No upper extremity supported;Feet supported Sitting balance-Leahy Scale: Fair     Standing balance support: Bilateral upper extremity supported Standing balance-Leahy Scale: Poor Standing balance comment: heavy reliance on BUE support on RW                           ADL either performed or assessed with clinical judgement   ADL Overall ADL's : Needs assistance/impaired Eating/Feeding: Modified independent Eating/Feeding Details (indicate cue type and reason): able to hold plastic utensitl and open bottle;reports she has adaptive equipment at home to cut food;husband also assists with meal prep Grooming: Minimal assistance;Sitting Grooming Details (indicate cue type and reason): decreased use of LUE Upper Body Bathing: Set up;Sitting   Lower Body Bathing: Minimal assistance;Sit to/from stand   Upper Body Dressing : Set up;Sitting   Lower Body Dressing: Minimal assistance;Sit to/from stand   Toilet Transfer: Minimal assistance;Stand-pivot;RW Armed forces technical officer Details (indicate cue type and reason): simulated from EOB to recliner Toileting- Clothing Manipulation and Hygiene: Minimal assistance;Sit to/from stand       Functional mobility during ADLs: Minimal assistance;Rolling walker       Vision Baseline Vision/History: Wears glasses Wears Glasses: Reading only Patient Visual Report: No change from baseline       Perception     Praxis      Pertinent Vitals/Pain Pain Assessment: Faces Faces Pain Scale: Hurts a little bit Pain Location: LLE with calf and hamstring stretch Pain Descriptors / Indicators: Grimacing Pain Intervention(s): Limited activity within patient's tolerance;Monitored during session;Repositioned  Hand Dominance Right   Extremity/Trunk Assessment Upper Extremity Assessment Upper  Extremity Assessment: RUE deficits/detail;LUE deficits/detail RUE Deficits / Details: grossly 4/5;able to use functionally, increased time for finger to thumb opposition;poor rapid alternating movement in BUE; RUE Sensation: decreased light touch RUE Coordination: decreased fine motor;decreased gross motor LUE Deficits / Details: grossly 3-/5;pt demonstrates spasticity in Lhand following grip strength test, hand spasticity digits into flexion;pt able to use RUE to extend digits and stretch LUE Sensation: decreased light touch;decreased proprioception LUE Coordination: decreased fine motor;decreased gross motor   Lower Extremity Assessment Lower Extremity Assessment: Defer to PT evaluation   Cervical / Trunk Assessment Cervical / Trunk Assessment: Normal   Communication Communication Communication: No difficulties(reports hoarse throat)   Cognition Arousal/Alertness: Awake/alert Behavior During Therapy: WFL for tasks assessed/performed Overall Cognitive Status: Within Functional Limits for tasks assessed                                     General Comments  vss    Exercises     Shoulder Instructions      Home Living Family/patient expects to be discharged to:: Private residence Living Arrangements: Spouse/significant other Available Help at Discharge: Available PRN/intermittently(husband works) Type of Home: House Home Access: Stairs to enter Secretary/administrator of Steps: 5 Entrance Stairs-Rails: Can reach both Home Layout: Two level;Able to live on main level with bedroom/bathroom(upstairs in gues living)     Bathroom Shower/Tub: Walk-in shower(step in  about 6inch)   Bathroom Toilet: Handicapped height Bathroom Accessibility: Yes How Accessible: Accessible via walker Home Equipment: Grab bars - tub/shower;Walker - 4 wheels;Shower seat   Additional Comments: plan to get home remodelled to have roll in shower shower is "size of [place]"      Prior  Functioning/Environment Level of Independence: Independent with assistive device(s)        Comments: used rollator at all times        OT Problem List: Decreased activity tolerance;Impaired balance (sitting and/or standing);Impaired UE functional use;Pain      OT Treatment/Interventions: Self-care/ADL training;Therapeutic exercise;Therapeutic activities;Patient/family education;Balance training    OT Goals(Current goals can be found in the care plan section) Acute Rehab OT Goals Patient Stated Goal: to go home OT Goal Formulation: With patient Time For Goal Achievement: 04/15/19 Potential to Achieve Goals: Good ADL Goals Pt Will Perform Grooming: with modified independence;standing;sitting Pt Will Perform Upper Body Dressing: with modified independence;sitting;standing Pt Will Perform Lower Body Dressing: with modified independence;sit to/from stand Pt Will Transfer to Toilet: with modified independence Pt Will Perform Toileting - Clothing Manipulation and hygiene: with modified independence;sit to/from stand  OT Frequency: Min 2X/week   Barriers to D/C: Decreased caregiver support  pt's husband works during the [employer] to assist       Co-evaluation PT/OT/SLP Co-Evaluation/Treatment: Yes Reason for Co-Treatment: Complexity of the patient's impairments (multi-system involvement);For patient/therapist safety;To address functional/ADL transfers   OT goals addressed during session: ADL's and self-care      AM-PAC OT "6 Clicks" Daily Activity     Outcome Measure Help from another person eating meals?: A Little Help from another person taking care of personal grooming?: A Little Help from another person toileting, which includes using toliet, bedpan, or urinal?: A Little Help from another person bathing (including washing, rinsing, drying)?: A Little Help from another person to put on and taking off regular upper body clothing?: A Little Help from another person  to  put on and taking off regular lower body clothing?: A Little 6 Click Score: 18   End of Session Equipment Utilized During Treatment: Gait belt;Rolling walker Nurse Communication: Mobility status  Activity Tolerance: Patient tolerated treatment well Patient left: in chair;with call bell/phone within reach  OT Visit Diagnosis: Unsteadiness on feet (R26.81);Other abnormalities of gait and mobility (R26.89);Muscle weakness (generalized) (M62.81);Pain Pain - Right/Left: Left Pain - part of body: Leg                Time: 1212-1300 OT Time Calculation (min): 48 min Charges:  OT General Charges $OT Visit: 1 Visit OT Evaluation $OT Eval Moderate Complexity: 1 Mod OT Treatments $Self Care/Home Management : 8-22 mins  Sharon Odom OTR/L Acute Rehabilitation Services Office: [phone]   Rebeca Alert 04/01/2019, 1:19 PM

## 2019-04-01 NOTE — Evaluation (Signed)
Physical Therapy Evaluation Patient Details Name: Sharon Odom MRN: [id number] DOB: 1970/09/25 Today's Date: 04/01/2019   History of Present Illness  Pt is a 48 y.o. female admitted 03/30/19 with increasing muscle weakness, spasms and blurry vision. Of note, pt had intrathecal baclofen pump inserted 02/18/19. MRI brain, cervical and thoracic spine shows no acute demyelinating lesion. Other PMH includes multiple sclerosis, depression, asthma.    Clinical Impression  Pt presents with an overall decrease in functional mobility secondary to above. PTA, pt mod indep with rollator and lives with husband, recently started baclofen pump with increasing dosage; the past few days leading to admission, pt with worsening weakness and other symptoms that pt attributes to severe MS exacerbation. Today, pt requiring min-maxA for mobility, limited by significant L-side weakness, impaired motion and decreased activity tolerance. Based on pt's PLOF, support system and motivation to regain mod indep PLOF, feel she would benefit from intensive CIR-level therapies to maximize functional mobility and independence prior to d/c home; pt in agreement.     Follow Up Recommendations CIR;Supervision for mobility/OOB    Equipment Recommendations  (TBD)    Recommendations for Other Services Rehab consult     Precautions / Restrictions Precautions Precautions: Fall Restrictions Weight Bearing Restrictions: No      Mobility  Bed Mobility Overal bed mobility: Needs Assistance Bed Mobility: Supine to Sit     Supine to sit: Max assist;HOB elevated     General bed mobility comments: Pt able to manage LLE with use of foot strap and BUE support, unsuccessful attempts to power into sitting with use of bed rail, requiring maxA for UE support and trunk elevation, assist to scoot hips to EOB, pt reliant on momentum  Transfers Overall transfer level: Needs assistance Equipment used: Rolling walker (2 wheeled) Transfers:  Sit to/from Stand Sit to Stand: Mod assist Stand pivot transfers: Min assist;+2 safety/equipment       General transfer comment: Pt reliant on momentum and modA to power into standing, used RUE to place L hand on RW, heavy reliance on RUE to push, losing balance with translation of UE support to RW  Ambulation/Gait Ambulation/Gait assistance: Min assist;Mod assist Gait Distance (Feet): 1 Feet Assistive device: Rolling walker (2 wheeled) Gait Pattern/deviations: Step-to pattern;Decreased weight shift to left;Trunk flexed     General Gait Details: Pivotal steps from bed to recliner with RW, pt able to take complete step with RLE but unable to clear L foot from ground and difficulty scooting requiring assist; difficulty accepting weight onto LLE due to weakness/stiffness/pain  Stairs            Wheelchair Mobility    Modified Rankin (Stroke Patients Only)       Balance Overall balance assessment: Needs assistance Sitting-balance support: No upper extremity supported;Feet supported Sitting balance-Leahy Scale: Fair     Standing balance support: Bilateral upper extremity supported Standing balance-Leahy Scale: Poor Standing balance comment: heavy reliance on BUE support on RW                             Pertinent Vitals/Pain Pain Assessment: Faces Faces Pain Scale: Hurts little more Pain Location: LLE with calf and hamstring stretch Pain Descriptors / Indicators: Grimacing;Guarding Pain Intervention(s): Monitored during session;Repositioned;Premedicated before session    Home Living Family/patient expects to be discharged to:: Private residence Living Arrangements: Spouse/significant other Available Help at Discharge: Family;Available PRN/intermittently Type of Home: House Home Access: Stairs to enter Entrance Stairs-Rails: Can reach  both Entrance Stairs-Number of Steps: 5 Home Layout: Two level;Able to live on main level with bedroom/bathroom(upstairs  in gues living) Home Equipment: Grab bars - tub/shower;Walker - 4 wheels;Shower seat Additional Comments: Husband works, pt reports having supportive neighbors who are home during day and can provide assist as needed. Plans to get home remodeled to include larger walk-in shower    Prior Function Level of Independence: Independent with assistive device(s)         Comments: Prior to exacerbation, pt mod indep with rollator. Since last weekend, reports "crab walking" requiring assist from husband for standing and transfers due to weakness and other symptoms     Hand Dominance   Dominant Hand: Right    Extremity/Trunk Assessment   Upper Extremity Assessment Upper Extremity Assessment: RUE deficits/detail;LUE deficits/detail RUE Deficits / Details: grossly 4/5 throughout; able to use functionally, increased time for finger to thumb opposition; poor rapid alternating movement in BUE RUE Sensation: decreased light touch RUE Coordination: decreased fine motor;decreased gross motor LUE Deficits / Details: grossly 3-/5;pt demonstrates spasticity in L hand following grip strength test, hand spasticity digits into flexion; pt able to use RUE to extend digits and stretch LUE Sensation: decreased light touch;decreased proprioception LUE Coordination: decreased fine motor;decreased gross motor    Lower Extremity Assessment Lower Extremity Assessment: RLE deficits/detail;LLE deficits/detail RLE Deficits / Details: Functionally 4/5 throughout RLE Sensation: decreased light touch;decreased proprioception RLE Coordination: decreased gross motor;decreased fine motor LLE Deficits / Details: Functionally <3/5 throughout LLE Sensation: decreased light touch;decreased proprioception LLE Coordination: decreased fine motor;decreased gross motor    Cervical / Trunk Assessment Cervical / Trunk Assessment: Normal  Communication   Communication: No difficulties  Cognition Arousal/Alertness:  Awake/alert Behavior During Therapy: WFL for tasks assessed/performed Overall Cognitive Status: Within Functional Limits for tasks assessed                                 General Comments: Good awareness of medical history and current condition      General Comments General comments (skin integrity, edema, etc.): vss    Exercises Other Exercises Other Exercises: Use of pt's foot strap to perform calf stretch, hamstring stretch, figure-4 stretch while seated in recliner   Assessment/Plan    PT Assessment Patient needs continued PT services  PT Problem List Decreased strength;Decreased range of motion;Decreased activity tolerance;Decreased balance;Decreased mobility;Pain       PT Treatment Interventions DME instruction;Gait training;Stair training;Functional mobility training;Therapeutic activities;Therapeutic exercise;Balance training;Patient/family education    PT Goals (Current goals can be found in the Care Plan section)  Acute Rehab PT Goals Patient Stated Goal: Interested in rehab before return home; back to independence and determining necessary baclofen pump dosage PT Goal Formulation: With patient Time For Goal Achievement: 04/15/19 Potential to Achieve Goals: Good    Frequency Min 3X/week   Barriers to discharge        Co-evaluation PT/OT/SLP Co-Evaluation/Treatment: Yes Reason for Co-Treatment: Complexity of the patient's impairments (multi-system involvement);For patient/therapist safety;To address functional/ADL transfers   OT goals addressed during session: ADL's and self-care       AM-PAC PT "6 Clicks" Mobility  Outcome Measure Help needed turning from your back to your side while in a flat bed without using bedrails?: A Lot Help needed moving from lying on your back to sitting on the side of a flat bed without using bedrails?: A Lot Help needed moving to and from a bed to a chair (  including a wheelchair)?: A Lot Help needed standing up  from a chair using your arms (e.g., wheelchair or bedside chair)?: A Lot Help needed to walk in hospital room?: A Lot Help needed climbing 3-5 steps with a railing? : A Lot 6 Click Score: 12    End of Session Equipment Utilized During Treatment: Gait belt Activity Tolerance: Patient tolerated treatment well Patient left: in chair;with call bell/phone within reach Nurse Communication: Mobility status PT Visit Diagnosis: Other abnormalities of gait and mobility (R26.89);Muscle weakness (generalized) (M62.81);Other symptoms and signs involving the nervous system (R29.898)    Time: 4481-8563 PT Time Calculation (min) (ACUTE ONLY): 41 min   Charges:   PT Evaluation $PT Eval Moderate Complexity: 1 Mod     Ina Homes, PT, DPT Acute Rehabilitation Services  Pager [phone] Office [phone]  Malachy Chamber 04/01/2019, 2:08 PM

## 2019-04-01 NOTE — Progress Notes (Addendum)
NEUROLOGY PROGRESS NOTE  Subjective: Patient states that her drowsiness has improved, her spasticity in the upper left arm has improved with opiates plus the decrease in baclofen.  Patient states that her swallowing also has improved and is now able to swallow water with no difficulty.  Vision has improved.  Exam: Vitals:   03/31/19 2035 04/01/19 0640  BP: 109/73 115/74  Pulse: 72 75  Resp: 14 14  Temp: 98.7 F (37.1 C) 98.8 F (37.1 C)  SpO2: 92% 98%    ROS General ROS: negative for - chills, fatigue, fever, night sweats, weight gain or weight loss Psychological ROS: negative for - behavioral disorder, hallucinations, memory difficulties, mood swings or suicidal ideation Ophthalmic ROS: negative for - blurry vision, double vision, eye pain or loss of vision ENT ROS: negative for - epistaxis, nasal discharge, oral lesions, sore throat, tinnitus or vertigo Respiratory ROS: negative for - cough, hemoptysis, shortness of breath or wheezing Cardiovascular ROS: negative for - chest pain, dyspnea on exertion, edema or irregular heartbeat Gastrointestinal ROS: negative for - abdominal pain, diarrhea, hematemesis, nausea/vomiting or stool incontinence Genito-Urinary ROS: negative for - dysuria, hematuria, incontinence or urinary frequency/urgency Musculoskeletal ROS: Positive for -increased muscular spasticity Neurological ROS: as noted in HPI Dermatological ROS: negative for rash and skin lesion changes   Neuro:   Physical Exam  Constitutional: Appears well-developed and well-nourished.  Psych: Affect appropriate to situation Eyes: No scleral injection HENT: No OP obstrucion Head: Normocephalic.  Cardiovascular: Normal rate and regular rhythm.  Respiratory: Effort normal, non-labored breathing GI: Soft.  No distension. There is no tenderness.  Skin: WDI    Mental Status: Alert, oriented, thought content appropriate.  Speech fluent without evidence of aphasia.  Able to follow 3  step commands without difficulty. Cranial Nerves: II:  Visual fields grossly normal,  III,IV, VI: ptosis not present, extra-ocular motions intact bilaterally pupils equal, round, reactive to light and accommodation V,VII: smile symmetric, facial light touch sensation normal bilaterally VIII: hearing normal bilaterally  Motor: -Left arm shows drift with 4/5 strength with slight increased tone.  Right arm shows 5/5 strength.  Right leg shows 4/5 strength with hip flexion, dorsiflexion 5/5, plantar flexion 3/5.  Left leg has significant increased tone with 0/5 strength with hip flexion, knee flexion, she does have plantar flexion of 2/5 dorsi flexion of 4/5. Cerebellar Patient shows no ataxia or dysmetria in the upper extremities    Medications:  Scheduled: . acetaminophen  1,000 mg Oral Q8H  . baclofen  20 mg Oral TID  . buPROPion  300 mg Oral Daily  . cholecalciferol  5,000 Units Oral Daily  . dalfampridine  10 mg Oral BID  . DULoxetine  30 mg Oral BID  . enoxaparin (LOVENOX) injection  40 mg Subcutaneous Q24H  . famotidine  20 mg Oral QHS  . montelukast  10 mg Oral Daily  . oxybutynin  10 mg Oral Daily  . pantoprazole  40 mg Oral Daily  . polyethylene glycol  17 g Oral Daily  . sodium chloride flush  3 mL Intravenous Once  . vitamin B-12  2,500 mcg Oral Daily   Continuous: . sodium chloride    . methylPREDNISolone (SOLU-MEDROL) injection Stopped (03/31/19 2023)    Pertinent Labs/Diagnostics:   MRI brain and cervical spine . IMPRESSION: MRI HEAD IMPRESSION: 1. Patchy multifocal signal changes involving the supratentorial and infratentorial cerebral white matter as above, compatible with provided history of multiple sclerosis. Overall, changes are mildly progressed relative to previous MRI from  2019. No evidence for active demyelination. 2. No other acute intracranial abnormality. MRI CERVICAL SPINE IMPRESSION: 1. Patchy multifocal cord signal abnormality involving the cervical  spinal cord as detailed above, consistent with history of multiple sclerosis. Overall, appearance appears mildly progressed relative to 2019. No evidence for active demyelination. 2. Degenerative spondylosis at C5-6 and C6-7 with resultant mild spinal stenosis, with moderate right C6 and mild left C7 foraminal narrowing. 3. Mild left C3 and C4 foraminal stenosis, with moderate right C5 foraminal narrowing related to disc bulge and uncovertebral disease. MRI THORACIC SPINE IMPRESSION: Normal MRI of the thoracic spine and spinal cord. No evidence for demyelinating disease. No significant disc pathology, stenosis, or neural impingement. Electronically Signed   By: Jeannine Boga M.D.   On: 03/31/2019 01:02     Etta Quill PA-C [facility] [phone]  Attending addendum Patient seen and examined Discussed care with the outpatient neurologist Dr. Felecia Shelling. I had a detailed discussion with patient regarding the plan below.  Assessment: 48 year old female with past medical history of multiple sclerosis, on elotuzumab, came in with new symptoms of right arm weakness along with exacerbation of left-sided MS symptoms.  Dr. Felecia Shelling has been contacted and feels although imaging does not reveal gadolinium enhancement, her current presentation does not warrant treatment with IV steroids.  Impression:  -MS exacerbation  Recommendations: -Continue Solu-Medrol at 1000 mg daily for total of 3 doses. -Patient will need to follow-up with Dr. Rachel Moulds her primary neurologist at discharge -Continue PT/OT -Plan discussed with primary hospitalist.   04/01/2019, 10:30 AM  -- Amie Portland, MD [facility] Pager: [phone] If 7pm to 7am, please call on call as listed on [facility].

## 2019-04-01 NOTE — Progress Notes (Signed)
Rehab Admissions Coordinator Note:  Patient was screened by Michel Santee for appropriateness for an Inpatient Acute Rehab Consult.  At this time, we are recommending Inpatient Rehab consult.  I will page MD for a consult.   Michel Santee 04/01/2019, 2:20 PM  I can be reached at [phone].

## 2019-04-02 DIAGNOSIS — J45909 Unspecified asthma, uncomplicated: Secondary | ICD-10-CM

## 2019-04-02 DIAGNOSIS — G35 Multiple sclerosis: Principal | ICD-10-CM

## 2019-04-02 DIAGNOSIS — K5900 Constipation, unspecified: Secondary | ICD-10-CM

## 2019-04-02 DIAGNOSIS — M62838 Other muscle spasm: Secondary | ICD-10-CM

## 2019-04-02 LAB — COMPREHENSIVE METABOLIC PANEL
ALT: 16 U/L (ref 0–44)
AST: 11 U/L — ABNORMAL LOW (ref 15–41)
Albumin: 3.2 g/dL — ABNORMAL LOW (ref 3.5–5.0)
Alkaline Phosphatase: 68 U/L (ref 38–126)
Anion gap: 9 (ref 5–15)
BUN: 17 mg/dL (ref 6–20)
CO2: 21 mmol/L — ABNORMAL LOW (ref 22–32)
Calcium: 9.2 mg/dL (ref 8.9–10.3)
Chloride: 111 mmol/L (ref 98–111)
Creatinine, Ser: 0.79 mg/dL (ref 0.44–1.00)
GFR calc Af Amer: >60 mL/min (ref >60)
GFR calc non Af Amer: >60 mL/min (ref >60)
Glucose, Bld: 154 mg/dL — ABNORMAL HIGH (ref 70–99)
Potassium: 4.1 mmol/L (ref 3.5–5.1)
Sodium: 141 mmol/L (ref 135–145)
Total Bilirubin: 0.2 mg/dL — ABNORMAL LOW (ref 0.3–1.2)
Total Protein: 5.9 g/dL — ABNORMAL LOW (ref 6.5–8.1)

## 2019-04-02 LAB — CBC
HCT: 41.4 % (ref 36.0–46.0)
Hemoglobin: 13.7 g/dL (ref 12.0–15.0)
MCH: 32.9 pg (ref 26.0–34.0)
MCHC: 33.1 g/dL (ref 30.0–36.0)
MCV: 99.3 fL (ref 80.0–100.0)
Platelets: 256 10*3/uL (ref 150–400)
RBC: 4.17 MIL/uL (ref 3.87–5.11)
RDW: 12.5 % (ref 11.5–15.5)
WBC: 15.5 10*3/uL — ABNORMAL HIGH (ref 4.0–10.5)
nRBC: 0 % (ref 0.0–0.2)

## 2019-04-02 MED ORDER — FUROSEMIDE 20 MG PO TABS
20.0000 mg | ORAL_TABLET | Freq: Once | ORAL | Status: AC
  Administered 2019-04-02: 11:00:00 20 mg via ORAL
  Filled 2019-04-02: qty 1

## 2019-04-02 MED ORDER — SODIUM CHLORIDE 0.9 % IV SOLN
1000.0000 mg | Freq: Once | INTRAVENOUS | Status: AC
  Administered 2019-04-02: 12:00:00 1000 mg via INTRAVENOUS
  Filled 2019-04-02: qty 8

## 2019-04-02 NOTE — Progress Notes (Addendum)
Physical Therapy Treatment Patient Details Name: Sharon Odom MRN: [id number] DOB: 1970-10-24 Today's Date: 04/02/2019    History of Present Illness Pt is a 48 y.o. female admitted 03/30/19 with increasing muscle weakness, spasms and blurry vision. Of note, pt had intrathecal baclofen pump inserted 02/18/19. MRI brain, cervical and thoracic spine shows no acute demyelinating lesion. Other PMH includes multiple sclerosis, depression, asthma.    PT Comments    Pt feeling a bit better today. She uses leg lifter to A her with bed mobility and is able to get close to EOB with MIN/guard, but needs A to get fully turned.  Able to take a few steps today.  Con't to recommend CIR.   Follow Up Recommendations  CIR;Supervision for mobility/OOB     Equipment Recommendations  None recommended by PT    Recommendations for Other Services       Precautions / Restrictions Precautions Precautions: Fall Restrictions Weight Bearing Restrictions: No    Mobility  Bed Mobility Overal bed mobility: Needs Assistance Bed Mobility: Supine to Sit     Supine to sit: Min guard;Mod assist;HOB elevated     General bed mobility comments: MIN/guard to initiate transfer with use of leg lifter , but needed MOD A at end of transfer to tuse bed pad to get hips fully turned  Transfers Overall transfer level: Needs assistance Equipment used: Rolling walker (2 wheeled) Transfers: Sit to/from Stand Sit to Stand: Min assist;From elevated surface         General transfer comment: Improved transfer today with no loss of balance and good use of UE  Ambulation/Gait Ambulation/Gait assistance: Min assist Gait Distance (Feet): 3 Feet Assistive device: Rolling walker (2 wheeled) Gait Pattern/deviations: Step-through pattern;Decreased step length - left;Decreased dorsiflexion - left     General Gait Details: AMb from bed to window with L foot drag and then was able to pivot backwards to recliner   Stairs              Wheelchair Mobility    Modified Rankin (Stroke Patients Only)       Balance   Sitting-balance support: No upper extremity supported;Feet supported Sitting balance-Leahy Scale: Fair     Standing balance support: Bilateral upper extremity supported Standing balance-Leahy Scale: Poor Standing balance comment: requires UE support                            Cognition Arousal/Alertness: Awake/alert Behavior During Therapy: WFL for tasks assessed/performed Overall Cognitive Status: Within Functional Limits for tasks assessed                                        Exercises General Exercises - Lower Extremity Ankle Circles/Pumps: AROM;Both;10 reps Heel Slides: AROM;AAROM;Both;10 reps Hip ABduction/ADduction: AROM;AAROM;Both;10 reps Other Exercises Other Exercises: Calf and toe stretching    General Comments        Pertinent Vitals/Pain Pain Assessment: Faces Faces Pain Scale: Hurts little more Pain Location: L LE Pain Descriptors / Indicators: Tightness Pain Intervention(s): Limited activity within patient's tolerance;Monitored during session;Repositioned    Home Living                      Prior Function            PT Goals (current goals can now be found in the care plan section) Acute Rehab  PT Goals Patient Stated Goal: Interested in rehab before return home; back to independence and determining necessary baclofen pump dosage PT Goal Formulation: With patient Time For Goal Achievement: 04/15/19 Potential to Achieve Goals: Good Progress towards PT goals: Progressing toward goals    Frequency    Min 3X/week      PT Plan Current plan remains appropriate    Co-evaluation              AM-PAC PT "6 Clicks" Mobility   Outcome Measure  Help needed turning from your back to your side while in a flat bed without using bedrails?: A Lot Help needed moving from lying on your back to sitting on the side of  a flat bed without using bedrails?: A Lot Help needed moving to and from a bed to a chair (including a wheelchair)?: A Little Help needed standing up from a chair using your arms (e.g., wheelchair or bedside chair)?: A Little Help needed to walk in hospital room?: A Lot Help needed climbing 3-5 steps with a railing? : A Lot 6 Click Score: 14    End of Session Equipment Utilized During Treatment: Gait belt Activity Tolerance: Patient tolerated treatment well Patient left: in chair;with call bell/phone within reach   PT Visit Diagnosis: Other abnormalities of gait and mobility (R26.89);Muscle weakness (generalized) (M62.81);Other symptoms and signs involving the nervous system (F02.774)     Time: 1287-8676 PT Time Calculation (min) (ACUTE ONLY): 30 min  Charges:  $Therapeutic Exercise: 8-22 mins $Therapeutic Activity: 8-22 mins                     Sharon Odom, [place] Pager [phone] 04/02/2019    Sharon Odom 04/02/2019, 11:06 AM

## 2019-04-02 NOTE — Progress Notes (Signed)
Subjective: Patient reports Provement in upper extremity spasticity, diplopia, ability to swallow since adding placed on steroids  Objective: Vital signs in last 24 hours: Temp:  [98 F (36.7 C)-99.1 F (37.3 C)] 99.1 F (37.3 C) (11/05 0524) Pulse Rate:  [92-94] 94 (11/05 0524) Resp:  [17-18] 17 (11/05 0524) BP: (115-146)/(77-91) 115/91 (11/05 0524) SpO2:  [93 %-97 %] 95 % (11/05 0524)  Intake/Output from previous day: 11/04 0701 - 11/05 0700 In: 847.8 [P.O.:530; I.V.:317.8] Out: 525 [Urine:525] Intake/Output this shift: Total I/O In: 240 [P.O.:240] Out: -   Neurologic: Mental status: Alert, oriented, thought content appropriate Cranial nerves: normal Motor: Improved: Range of motion and strength in the left upper extremity from the elbow to the wrist to the fingers.  Grip strength improved As above Resp: Normal excursion, no rhonchi, rales, or wheezing audible Will having some increased spasticity in the left lower extremity  Lab Results: Recent Labs    04/01/19 0227 04/02/19 0258  WBC 6.5 15.5*  HGB 15.0 13.7  HCT 44.9 41.4  PLT 252 256   BMET Recent Labs    04/01/19 0227 04/02/19 0258  NA 140 141  K 4.3 4.1  CL 111 111  CO2 21* 21*  GLUCOSE 201* 154*  BUN 15 17  CREATININE 0.89 0.79  CALCIUM 9.4 9.2    Studies/Results: No results found.  Assessment/Plan: It appears grossly improved and what appears to be an MS exacerbation after Solu-Medrol.   LOS: 2 days  COMPLETELY agree with recommendation for inpatient rehab.  We will remain available to that service when she goes to adjust her intrathecal baclofen requested.   Bonna Gains 04/02/2019, 12:38 PM

## 2019-04-02 NOTE — PMR Pre-admission (Signed)
PMR Admission Coordinator Pre-Admission Assessment  Patient: Sharon Odom is an 48 y.o., female MRN: [id number] DOB: Oct 05, 1970 Height: '5\' 9"'$  (175.3 cm) Weight: 89 kg  Insurance Information HMO:     PPO: yes     PCP:      IPA:      80/20:      OTHER:  PRIMARY: Damita Lack of [place]      Policy#: [id number]      Subscriber: Elberta Leatherwood CM Name: Authorization provided via Automated Fax      Phone#: [phone]     Fax#: [phone] Pre-Cert#: Reference number [id number]    Employer:  Faxed approval from [facility] on 04/03/2019 for admit to [place] on 04/03/2019. Pt is approved for 21 days (approved through 04/23/2019). Clinical updates are required on 04/23/2019 OR discharge on 04/24/2019. Discharge notification is required. Clinical updates are to be faxed to [phone]). Benefits:  Phone #: [phone]     Name:  Eff. Date: 05/28/2016 - still active     Deduct: $4,000 (deductible has been met)      Out of Pocket Max: $6,000 ($6,000 met)      Life Max: NA CIR: 100% coverage      SNF: 100% covered with a limit of 130 days/cal yr (68 days remaining), prior auth required Outpatient: PT/OT/ST limited to medical necessity (therapy limits only applies to cardiac rehab services)     Co-Pay: $72/visit Home Health: 100% covered      Co-Pay:  DME: 100% covered (no cap)     Co-Pay:  Providers:  SECONDARY: Medicare Part A       Policy#: [id number]      Subscriber: Patient CM Name:       Phone#:      Fax#:  Pre-Cert#:      Employer:  Benefits:  Phone #: verified online     Name: verified eligibility online via [employer] on 04/03/2019 Eff. Date: Part A effective 06/28/2014      Deduct: $1,408      Out of Pocket Max:       Life Max:  CIR:       SNF:  Outpatient:      Co-Pay:  Home Health:       Co-Pay:  DME:     Co-Pay:   Medicaid Application Date:       Case Manager:  Disability Application Date:       Case Worker:   The "Data Collection Information  Summary" for patients in Inpatient Rehabilitation Facilities with attached "Privacy Act [place] Records" was provided and verbally reviewed with: Patient  Emergency Contact Information Contact Information    Name Relation Home Work Mobile   Ela, Moffat [facility]   [phone]      Current Medical History  Patient Admitting Diagnosis: exacerbation of MS symtoms  History of Present Illness: Sharon Odom is a 48 year old female with history of asthma with tobacco abuse, anxiety/depression as well as multiple sclerosis diagnosed 2013 followed outpatient by neurology services Dr. Felecia Shelling maintained on [place] as well as dalfampridine.  Patient with recent placement of baclofen pump 02/18/2019 per Dr. Maryjean Ka for increasing spasticity related to her multiple sclerosis. Presented 03/31/2019 with increasing lower extremity weakness, dysphagia, muscle spasms, blurred vision and worsening of chronic left hand weakness.  She did recently see her ophthalmologist for blurred vision as report no acute findings noted.  Admission chemistries unremarkable, WBC 11,000, SARS Covid negative.  MRI of the brain, cervical and thoracic spine  with and without contrast obtained revealed multiple chronic demyelinating lesions without acute enhancement.  Neurology follow-up it was advised to begin IV Solu-Medrol x3 doses 03/31/2019 for suspected MS exacerbation.  Subcutaneous Lovenox added for DVT prophylaxis.  Speech therapy evaluation for dysphagia diet has steadily been advanced now on a regular consistency.  Therapy evaluations completed and patient is to be admitted for a comprehensive rehab program on 04/03/2019.    Patient's medical record from [facility] has been reviewed by the rehabilitation admission coordinator and physician.  Past Medical History  Past Medical History:  Diagnosis Date  . Allergies   . Anxiety   . Arthritis   . Asthma   . Constipation   . Depression   . GERD  (gastroesophageal reflux disease)   . Headache   . History of hiatal hernia   . Multiple sclerosis ([place])   . Vision abnormalities   . Wears glasses     Family History   family history includes Diabetes in her mother; Healthy in her brother.  Prior Rehab/Hospitalizations Has the patient had prior rehab or hospitalizations prior to admission? Yes  Has the patient had major surgery during 100 days prior to admission? Yes   Current Medications  Current Facility-Administered Medications:  .  acetaminophen (TYLENOL) tablet 1,000 mg, 1,000 mg, Oral, Q8H, Cristescu, Linard Millers, MD, Stopped at 04/01/19 2244 .  albuterol (PROVENTIL) (2.5 MG/3ML) 0.083% nebulizer solution 2.5 mg, 2.5 mg, Nebulization, Q6H PRN, Cristescu, Mircea G, MD .  baclofen (LIORESAL) tablet 20 mg, 20 mg, Oral, TID, Madilyn Hook A, PA-C, 20 mg at 04/03/19 1016 .  buPROPion (WELLBUTRIN XL) 24 hr tablet 300 mg, 300 mg, Oral, Daily, Cristescu, Mircea G, MD, 300 mg at 04/03/19 1015 .  cholecalciferol (VITAMIN D3) tablet 5,000 Units, 5,000 Units, Oral, Daily, Cristescu, Linard Millers, MD, 5,000 Units at 04/03/19 1016 .  dalfampridine TB12 10 mg, 10 mg, Oral, BID, Dhungel, Nishant, MD, 10 mg at 04/03/19 1017 .  diazepam (VALIUM) tablet 5 mg, 5 mg, Oral, Q8H PRN, Cristescu, Mircea G, MD .  DULoxetine (CYMBALTA) DR capsule 30 mg, 30 mg, Oral, BID, Cristescu, Mircea G, MD, 30 mg at 04/03/19 1017 .  enoxaparin (LOVENOX) injection 40 mg, 40 mg, Subcutaneous, Q24H, Cristescu, Mircea G, MD, 40 mg at 04/03/19 0615 .  famotidine (PEPCID) tablet 20 mg, 20 mg, Oral, QHS, Cristescu, Mircea G, MD, 20 mg at 04/02/19 2109 .  ibuprofen (ADVIL) tablet 400 mg, 400 mg, Oral, Q8H PRN, Cristescu, Mircea G, MD, 400 mg at 04/03/19 1305 .  montelukast (SINGULAIR) tablet 10 mg, 10 mg, Oral, Daily, Cristescu, Mircea G, MD, 10 mg at 04/03/19 1016 .  ondansetron (ZOFRAN) tablet 8 mg, 8 mg, Oral, Q8H PRN, Cristescu, Mircea G, MD .  oxybutynin (DITROPAN-XL) 24 hr  tablet 10 mg, 10 mg, Oral, Daily, Cristescu, Mircea G, MD, 10 mg at 04/03/19 1017 .  oxyCODONE-acetaminophen (PERCOCET/ROXICET) 5-325 MG per tablet 1 tablet, 1 tablet, Oral, Q4H PRN, 1 tablet at 04/03/19 1305 **AND** oxyCODONE (Oxy IR/ROXICODONE) immediate release tablet 10 mg, 10 mg, Oral, Q4H PRN, Cristescu, Mircea G, MD, 10 mg at 04/03/19 1305 .  pantoprazole (PROTONIX) EC tablet 40 mg, 40 mg, Oral, Daily, Dhungel, Nishant, MD, 40 mg at 04/03/19 1016 .  polyethylene glycol (MIRALAX / GLYCOLAX) packet 17 g, 17 g, Oral, Daily, Dhungel, Nishant, MD, 17 g at 04/03/19 1017 .  sodium chloride flush (NS) 0.9 % injection 3 mL, 3 mL, Intravenous, Once, Darl Householder Lujean Rave, MD, Stopped  at 03/30/19 2233 .  vitamin B-12 (CYANOCOBALAMIN) tablet 2,500 mcg, 2,500 mcg, Oral, Daily, Dhungel, Nishant, MD, 2,500 mcg at 04/03/19 1015  Patients Current Diet:  Diet Order            Diet - low sodium heart healthy        DIET SOFT Room service appropriate? Yes; Fluid consistency: Thin  Diet effective now              Precautions / Restrictions Precautions Precautions: Fall Restrictions Weight Bearing Restrictions: No   Has the patient had 2 or more falls or a fall with injury in the past year? No  Prior Activity Level Limited Community (1-2x/wk): would get out as much as she needed too; limited mostly to MD appointments though because she has so many. did not work or drive PTA.   Prior Functional Level Self Care: Did the patient need help bathing, dressing, using the toilet or eating? Independent  Indoor Mobility: Did the patient need assistance with walking from room to room (with or without device)? Independent  Stairs: Did the patient need assistance with internal or external stairs (with or without device)? Independent  Functional Cognition: Did the patient need help planning regular tasks such as shopping or remembering to take medications? Independent  Home Assistive Devices / Equipment Home  Assistive Devices/Equipment: Wheelchair Home Equipment: Grab bars - tub/shower, Environmental consultant - 4 wheels, Shower seat  Prior Device Use: Indicate devices/aids used by the patient prior to current illness, exacerbation or injury? Manual wheelchair, Environmental consultant and (mostly used RW at home)  Current Functional Level Cognition  Overall Cognitive Status: Within Functional Limits for tasks assessed Orientation Level: Oriented X4 General Comments: Good awareness of medical history and current condition    Extremity Assessment (includes Sensation/Coordination)  Upper Extremity Assessment: RUE deficits/detail, LUE deficits/detail RUE Deficits / Details: grossly 4/5 throughout; able to use functionally, increased time for finger to thumb opposition; poor rapid alternating movement in BUE RUE Sensation: decreased light touch RUE Coordination: decreased fine motor, decreased gross motor LUE Deficits / Details: grossly 3-/5;pt demonstrates spasticity in L hand following grip strength test, hand spasticity digits into flexion; pt able to use RUE to extend digits and stretch LUE Sensation: decreased light touch, decreased proprioception LUE Coordination: decreased fine motor, decreased gross motor  Lower Extremity Assessment: RLE deficits/detail, LLE deficits/detail RLE Deficits / Details: Functionally 4/5 throughout RLE Sensation: decreased light touch, decreased proprioception RLE Coordination: decreased gross motor, decreased fine motor LLE Deficits / Details: Functionally <3/5 throughout LLE Sensation: decreased light touch, decreased proprioception LLE Coordination: decreased fine motor, decreased gross motor    ADLs  Overall ADL's : Needs assistance/impaired Eating/Feeding: Modified independent Eating/Feeding Details (indicate cue type and reason): able to hold plastic utensitl and open bottle;reports she has adaptive equipment at home to cut food;husband also assists with meal prep Grooming: Minimal  assistance, Sitting Grooming Details (indicate cue type and reason): decreased use of LUE Upper Body Bathing: Set up, Sitting Lower Body Bathing: Minimal assistance, Sit to/from stand Upper Body Dressing : Set up, Sitting Lower Body Dressing: Minimal assistance, Sit to/from stand Toilet Transfer: Minimal assistance, Stand-pivot, RW Toilet Transfer Details (indicate cue type and reason): simulated from EOB to recliner Toileting- Clothing Manipulation and Hygiene: Minimal assistance, Sit to/from stand Functional mobility during ADLs: Minimal assistance, Rolling walker    Mobility  Overal bed mobility: Needs Assistance Bed Mobility: Supine to Sit Supine to sit: Min guard, Mod assist, HOB elevated General bed mobility comments: MIN/guard  to initiate transfer with use of leg lifter , but needed MOD A at end of transfer to tuse bed pad to get hips fully turned    Transfers  Overall transfer level: Needs assistance Equipment used: Rolling walker (2 wheeled) Transfers: Sit to/from Stand Sit to Stand: Min assist, From elevated surface Stand pivot transfers: Min assist, +2 safety/equipment General transfer comment: Improved transfer today with no loss of balance and good use of UE    Ambulation / Gait / Stairs / Wheelchair Mobility  Ambulation/Gait Ambulation/Gait assistance: Herbalist (Feet): 3 Feet Assistive device: Rolling walker (2 wheeled) Gait Pattern/deviations: Step-through pattern, Decreased step length - left, Decreased dorsiflexion - left General Gait Details: AMb from bed to window with L foot drag and then was able to pivot backwards to recliner    Posture / Balance Balance Overall balance assessment: Needs assistance Sitting-balance support: No upper extremity supported, Feet supported Sitting balance-Leahy Scale: Fair Standing balance support: Bilateral upper extremity supported Standing balance-Leahy Scale: Poor Standing balance comment: requires UE support     Special needs/care consideration BiPAP/CPAP : no CPM : no Continuous Drip IV : no Dialysis : no        Days : no Life Vest : no Oxygen : no Special Bed : no Trach Size : no Wound Vac (area) : no      Location : no Skin: no areas of concern           Bowel mgmt: last BM 03/26/2019, continent Bladder mgmt: continent Diabetic mgmt: : no Behavioral consideration : no Chemo/radiation : no *has baclofen pump in LLQ   Previous Home Environment (from acute therapy documentation) Living Arrangements: Spouse/significant other Available Help at Discharge: Family, Available PRN/intermittently Type of Home: House Home Layout: Two level, Able to live on main level with bedroom/bathroom(upstairs in gues living) Home Access: Stairs to enter Entrance Stairs-Rails: Can reach both Entrance Stairs-Number of Steps: 5 Bathroom Shower/Tub: Walk-in shower(6-inch step in) Biochemist, clinical: Handicapped height Bathroom Accessibility: Yes How Accessible: Accessible via walker Home Care Services: No Additional Comments: Husband works, pt reports having supportive neighbors who are home during day and can provide assist as needed. Plans to get home remodeled to include larger walk-in shower  Discharge Living Setting Plans for Discharge Living Setting: Patient's home, Lives with (comment)(husband) Type of Home at Discharge: House Discharge Home Layout: Two level Alternate Level Stairs-Rails: Left Alternate Level Stairs-Number of Steps: 16 Discharge Home Access: Stairs to enter Entrance Stairs-Rails: Can reach both Entrance Stairs-Number of Steps: 4-5 Discharge Bathroom Shower/Tub: Walk-in shower Discharge Bathroom Toilet: Handicapped height Discharge Bathroom Accessibility: Yes How Accessible: Accessible via wheelchair, Accessible via walker Does the patient have any problems obtaining your medications?: No  Social/Family/Support Systems Patient Roles: Spouse Contact Information: husband:  Shanon Brow: [phone] Anticipated Caregiver: husband vs neighbors Anticipated Caregiver's Contact Information: see above Ability/Limitations of Caregiver: Min A Caregiver Availability: Intermittent Discharge Plan Discussed with Primary Caregiver: Yes Is Caregiver In Agreement with Plan?: Yes Does Caregiver/Family have Issues with Lodging/Transportation while Pt is in Rehab?: No  Goals/Additional Needs Patient/Family Goal for Rehab: PT/OT: Mod I/Supervision; SLP: NA Expected length of stay: 7-10 days Cultural Considerations: NA Dietary Needs: soft diet, thin liquids Equipment Needs: TBD Pt/Family Agrees to Admission and willing to participate: Yes Program Orientation Provided & Reviewed with Pt/Caregiver Including Roles  & Responsibilities: Yes(pt and husband)  Barriers to Discharge: Home environment access/layout, Lack of/limited family support, Insurance for SNF coverage  Barriers to Discharge Comments: steps  to enter; exercise room on 2nd story; only intermittant assist from husband/neighbors during working hours  Decrease burden of Care through IP rehab admission: NA  Possible need for SNF placement upon discharge: No  Patient Condition: I have reviewed medical records from [facility], spoken with RN, MD, and patient and spouse. I met with patient at the bedside for inpatient rehabilitation assessment.  Patient will benefit from ongoing PT and OT, can actively participate in 3 hours of therapy a day 5 days of the week, and can make measurable gains during the admission.  Patient will also benefit from the coordinated team approach during an Inpatient Acute Rehabilitation admission.  The patient will receive intensive therapy as well as Rehabilitation physician, nursing, social worker, and care management interventions.  Due to safety, skin/wound care, disease management, medication administration, pain management and patient education the patient requires 24 hour a day  rehabilitation nursing.  The patient is currently Min to [place] with mobility and basic ADLs.  Discharge setting and therapy post discharge at home with home health is anticipated.  Patient has agreed to participate in the Acute Inpatient Rehabilitation Program and will be admitted on 11/6.  Preadmission Screen Completed By:  Raechel Ache, 04/03/2019 1:25 PM ______________________________________________________________________   Discussed status with Dr. Ranell Patrick on 04/03/2019 at 1:24PM and received approval for admission today.  Admission Coordinator:  Raechel Ache, OT, time 1:24PM/Date 04/03/2019   Assessment/Plan: Diagnosis: 1. Does the need for close, 24 hr/day Medical supervision in concert with the patient's rehab needs make it unreasonable for this patient to be served in a less intensive setting? Yes 2. Co-Morbidities requiring supervision/potential complications: MS, asthma, depression, spasticity, pain 3. Due to bowel management, safety, skin/wound care, disease management, pain management and patient education, does the patient require 24 hr/day rehab nursing? Yes 4. Does the patient require coordinated care of a physician, rehab nurse, PT, OT, and SLP to address physical and functional deficits in the context of the above medical diagnosis(es)? Yes Addressing deficits in the following areas: balance, endurance, locomotion, strength, transferring, bowel/bladder control, bathing, dressing, feeding, grooming, toileting, cognition, speech, language, swallowing and psychosocial support 5. Can the patient actively participate in an intensive therapy program of at least 3 hrs of therapy 5 days a week? Yes 6. The potential for patient to make measurable gains while on inpatient rehab is excellent 7. Anticipated functional outcomes upon discharge from inpatient rehab: modified independent PT, modified independent OT, independent SLP 8. Estimated rehab length of stay to reach the above functional  goals is: 2 weeks 9. Anticipated discharge destination: Inpatient Rehabilitation 10. Overall Rehab/Functional Prognosis: excellent   MD Signature: Leeroy Cha, MD

## 2019-04-02 NOTE — Progress Notes (Signed)
   04/01/19 2120  MEWS Score  Resp 17  Pulse Rate 92  BP 136/77  Temp 98.9 F (37.2 C)  SpO2 93 %  MEWS Score  MEWS RR 0  MEWS Pulse 0  MEWS Systolic 0  MEWS LOC 0  MEWS Temp 0  MEWS Score 0  MEWS Score Color Green  MEWS Assessment  Is this an acute change? No  MEWS Guidelines - (patients age 47 and over)  Red - At High Risk for Deterioration Yellow - At risk for Deterioration  1. Go to room and assess patient 2. Validate data. Is this patient's baseline? If data confirmed: 3. Is this an acute change? 4. Administer prn meds/treatments as ordered. 5. Note Sepsis score 6. Review goals of care 7. Sports coach, RRT nurse and Provider. 8. Ask Provider to come to bedside.  9. Document patient condition/interventions/response. 10. Increase frequency of vital signs and focused assessments to at least q15 minutes x 4, then q30 minutes x2. - If stable, then q1h x3, then q4h x3 and then q8h or dept. routine. - If unstable, contact Provider & RRT nurse. Prepare for possible transfer. 11. Add entry in progress notes using the smart phrase ".MEWS". 1. Go to room and assess patient 2. Validate data. Is this patient's baseline? If data confirmed: 3. Is this an acute change? 4. Administer prn meds/treatments as ordered? 5. Note Sepsis score 6. Review goals of care 7. Sports coach and Provider 8. Call RRT nurse as needed. 9. Document patient condition/interventions/response. 10. Increase frequency of vital signs and focused assessments to at least q2h x2. - If stable, then q4h x2 and then q8h or dept. routine. - If unstable, contact Provider & RRT nurse. Prepare for possible transfer. 11. Add entry in progress notes using the smart phrase ".MEWS".  Green - Likely stable Lavender - Comfort Care Only  1. Continue routine/ordered monitoring.  2. Review goals of care. 1. Continue routine/ordered monitoring. 2. Review goals of care.

## 2019-04-02 NOTE — Progress Notes (Signed)
NEUROLOGY PROGRESS NOTE  Subjective: Patient no longer is having double vision or blurred vision.  States that her lower extremities have improved however still have some discomfort and pain.  Exam: Vitals:   04/01/19 2120 04/02/19 0524  BP: 136/77 (!) 115/91  Pulse: 92 94  Resp: 17 17  Temp: 98.9 F (37.2 C) 99.1 F (37.3 C)  SpO2: 93% 95%    ROS General ROS: negative for - chills, fatigue, fever, night sweats, weight gain or weight loss Psychological ROS: negative for - behavioral disorder, hallucinations, memory difficulties, mood swings or suicidal ideation Ophthalmic ROS: negative for - blurry vision, double vision, eye pain or loss of vision ENT ROS: negative for - epistaxis, nasal discharge, oral lesions, sore throat, tinnitus or vertigo Allergy and Immunology ROS: negative for - hives or itchy/watery eyes Hematological and Lymphatic ROS: negative for - bleeding problems, bruising or swollen lymph nodes Endocrine ROS: negative for - galactorrhea, hair pattern changes, polydipsia/polyuria or temperature intolerance Respiratory ROS: negative for - cough, hemoptysis, shortness of breath or wheezing Cardiovascular ROS: negative for - chest pain, dyspnea on exertion, edema or irregular heartbeat Gastrointestinal ROS: negative for - abdominal pain, diarrhea, hematemesis, nausea/vomiting or stool incontinence Genito-Urinary ROS: negative for - dysuria, hematuria, incontinence or urinary frequency/urgency Musculoskeletal ROS: Positive for -muscular soreness or muscular weakness Neurological ROS: as noted in HPI Dermatological ROS: negative for rash and skin lesion changes      Neuro:   Physical Exam  Constitutional: Appears well-developed and well-nourished.  Psych: Affect appropriate to situation Eyes: No scleral injection HENT: No OP obstrucion Head: Normocephalic.  Cardiovascular: Normal rate and regular rhythm.  Respiratory: Effort normal, non-labored breathing GI:  Soft.  No distension. There is no tenderness.  Skin: WDI Mental Status: Alert, oriented, thought content appropriate.  Speech fluent without evidence of aphasia.  Able to follow 3 step commands without difficulty. Cranial Nerves: II:  Visual fields grossly normal,  III,IV, VI: ptosis not present, extra-ocular motions intact bilaterally pupils equal, round, reactive to light and accommodation V,VII: smile symmetric, facial light touch sensation normal bilaterally VIII: hearing normal bilaterally Motor: Moving all extremities antigravity with no difficulty.  Bilateral dorsiflexion 4/5 Sensory: Pinprick and light touch intact throughout, bilaterally Cerebellar: normal finger-to-nose,     Medications:  Scheduled: . acetaminophen  1,000 mg Oral Q8H  . baclofen  20 mg Oral TID  . buPROPion  300 mg Oral Daily  . cholecalciferol  5,000 Units Oral Daily  . dalfampridine  10 mg Oral BID  . DULoxetine  30 mg Oral BID  . enoxaparin (LOVENOX) injection  40 mg Subcutaneous Q24H  . famotidine  20 mg Oral QHS  . montelukast  10 mg Oral Daily  . oxybutynin  10 mg Oral Daily  . pantoprazole  40 mg Oral Daily  . polyethylene glycol  17 g Oral Daily  . sodium chloride flush  3 mL Intravenous Once  . vitamin B-12  2,500 mcg Oral Daily   Continuous: . methylPREDNISolone (SOLU-MEDROL) injection      Pertinent Labs/Diagnostics:   No results found.   Sharon Morn PA-C [facility] [phone]   Assessment: 48 year old with a history of multiple sclerosis, [place]. Coming in with multiple complaints including worsening of spasticity over blurred vision as well as increased weakness and paresthesias also on the right side i in addition to worsening of the left-sided symptoms. 2 days of IV Solu-Medrol has been providing her some relief and exam has shown some improvement. Her imaging did  not reveal any enhancement. I spoke with Dr. Felecia Shelling, her outpatient multiple stroke specialist  who agreed with a few days of steroids although they did not make patient is gait disturbance stronger and better.  Impression:  Likely MS exacerbation versus pseudoexacerbation with good response to IV steroids   Recommendations: Complete 3 days of IV steroids Continue current doses on baclofen Discussed with Dr. Rachel Moulds as an outpatient as to what changes to the muscle relaxants need to be made.  I would also appreciate PMR input on symptomatic management of her spasticity. Relayed my plan to Dr. Maylene Roes Neurology will be available as needed Please call with questions   04/02/2019, 9:18 AM  Attending Neurohospitalist Addendum Patient seen and examined with APP/Resident. Agree with the history and physical as documented above. Agree with the plan as documented, which I helped formulate. I have independently reviewed the chart, obtained history, review of systems and examined the patient.I have personally reviewed pertinent head/neck/spine imaging (CT/MRI). Please feel free to call with any questions. --- Amie Portland, MD [facility] Pager: [phone]  If 7pm to 7am, please call on call as listed on AMION.

## 2019-04-02 NOTE — Discharge Summary (Signed)
Physician Discharge Summary  Sharon BrooksChristy Odom WUJ:[id number] DOB: 1970-10-03 DOA: 03/30/2019  PCP: Annita BrodAsenso, Philip, MD  Admit date: 03/30/2019 Discharge date: 04/02/2019  Admitted From: Home Disposition:  CIR   Recommendations for Outpatient Follow-up:  1. Follow up with PCP in 1 week 2. Follow up with Neurology Dr. Epimenio FootSater  3. Follow up with Neurosurgery Dr. Ollen BowlHarkins   Discharge Condition: Stable CODE STATUS: Full  Diet recommendation: Heart healthy   Brief/Interim Summary: From H&P by Dr. Nelda Bucksristescu: "Sharon BrooksChristy Vaux is a 48 y.o. female with medical history significant of multiple sclerosis, GERD, asthma, constipation, depression. She has muscle spasms at baseline and uses a walker. She had a baclofen pump placed 5 weeks ago, with increasing doses of baclofen qweek since then. She states that despite this, her symptoms have worsened to the point that she is struggling to walk. She went to an Ophthalmologist for the blurred vision and states that the Ophthalmologist told her that her eye exam was negative.   ED Course: MRI of brain, cervical and thoracic spine with and without contrast obtained in the ED revealed multiple chronic demyelinating lesions without acute enhancement. Emergency room physician consult Neurology in consult.   He feels that the symptom of the patient is more related to high dose of baclofen pump." Neurosurgery was also consulted.  Neurosurgery adjusted patient's baclofen pump and decrease the dose.  Patient's primary neurologist Dr. Epimenio FootSater was consulted, and although MRI did not show evidence of demyelinating lesion, he did recommend treating with 3 days of IV Solu-Medrol.  Patient had significant improvement with IV Solu-Medrol.  She was evaluated by PT OT and recommended for inpatient rehab placement on discharge.  Discharge Diagnoses:  Principal Problem:   Multiple sclerosis exacerbation (HCC) Active Problems:   Muscle spasticity   Gait disturbance   Depression with  anxiety   Spastic diplegia, acquired, lower extremity (HCC)   Dysphagia   Presence of intrathecal baclofen pump   Spell of generalized weakness   Acute weakness   Asthma   Constipated Leukocytosis, likely secondary to high dose steroids   Discharge Instructions  Discharge Instructions    Diet - low sodium heart healthy   Complete by: As directed    Increase activity slowly   Complete by: As directed      Allergies as of 04/02/2019      Reactions   Morphine Other (See Comments)      Medication List    STOP taking these medications   Botox 100 units Solr injection Generic drug: botulinum toxin Type A   cyclobenzaprine 10 MG tablet Commonly known as: FLEXERIL     TAKE these medications   acetaminophen 650 MG CR tablet Commonly known as: TYLENOL Take 1,300 mg by mouth every 8 (eight) hours as needed for pain.   albuterol 108 (90 Base) MCG/ACT inhaler Commonly known as: VENTOLIN HFA Inhale 1-2 puffs into the lungs every 6 (six) hours as needed for wheezing or shortness of breath.   albuterol (2.5 MG/3ML) 0.083% nebulizer solution Commonly known as: PROVENTIL Take 2.5 mg by nebulization every 6 (six) hours as needed for wheezing or shortness of breath.   baclofen 20 MG tablet Commonly known as: LIORESAL 1 PO Q6 hours PRN for breakthrough spasms What changed:   how much to take  how to take this  when to take this  additional instructions   buPROPion 300 MG 24 hr tablet Commonly known as: WELLBUTRIN XL Take 300 mg by mouth daily.   dalfampridine 10 MG  Tb12 One po bid What changed:   how much to take  how to take this  when to take this   diazepam 5 MG tablet Commonly known as: VALIUM Take up to 3 pills a day for spasticity What changed:   how much to take  how to take this  when to take this   DULoxetine 30 MG capsule Commonly known as: Cymbalta Take 1 capsule (30 mg total) by mouth 2 (two) times daily.   ibuprofen 200 MG  tablet Commonly known as: ADVIL Take 400 mg by mouth every 8 (eight) hours as needed for moderate pain.   Lemtrada 12 MG/1.2ML Soln Generic drug: Alemtuzumab Inject 12 mg into the vein See admin instructions. Once a year   montelukast 10 MG tablet Commonly known as: SINGULAIR Take 10 mg by mouth daily.   ondansetron 8 MG tablet Commonly known as: ZOFRAN as needed With infusions What changed:   how much to take  how to take this  when to take this  reasons to take this   oxybutynin 10 MG 24 hr tablet Commonly known as: DITROPAN-XL Take 10 mg by mouth daily.   oxyCODONE-acetaminophen 10-325 MG tablet Commonly known as: PERCOCET Take 1 tablet by mouth 2 (two) times daily as needed for pain.   ranitidine 300 MG tablet Commonly known as: ZANTAC Take 300 mg by mouth daily as needed for heartburn.   Vitamin B-12 2500 MCG Subl Place 2,500 mcg under the tongue daily.   Vitamin D-3 125 MCG (5000 UT) Tabs Take 5,000 Units by mouth daily.      Follow-up Information    Annita Brod, MD. Schedule an appointment as soon as possible for a visit in 1 week(s).   Specialty: Internal Medicine Contact information: [address] [place] [address] [phone]        Asa Lente, MD Follow up.   Specialty: Neurology Contact information: [address] [place] [address] [phone]        Odette Fraction, MD Follow up.   Specialty: Anesthesiology Contact information: [address] [place] [address] [phone]          Allergies  Allergen Reactions  . Morphine Other (See Comments)    Consultations:  Neurology  Neurosurgery    Procedures/Studies: Mr Laqueta Jean And Wo Contrast  Result Date: 03/31/2019 CLINICAL DATA:  Initial evaluation for increasing leg weakness, spasms, blurry vision. History of multiple sclerosis. EXAM: MRI HEAD WITHOUT AND WITH CONTRAST MRI CERVICAL SPINE WITHOUT AND WITH CONTRAST MRI THORACIC  SPINE WITHOUT AND WITH CONTRAST TECHNIQUE: Multiplanar, multiecho pulse sequences of the brain and surrounding structures, and cervical spine, to include the craniocervical junction and cervicothoracic junction, as well as the thoracic spine were obtained without and with intravenous contrast. CONTRAST:  9mL GADAVIST GADOBUTROL 1 MMOL/ML IV SOLN COMPARISON:  Previous MRI from 02/05/2018. FINDINGS: MRI HEAD FINDINGS Brain: Cerebral volume stable, and remains within normal limits for age. Multiple scattered patchy T2/FLAIR hyperintensities seen involving the periventricular, deep, and juxta cortical white matter of both cerebral hemispheres, consistent with provided history of multiple sclerosis. Scattered patchy involvement of the pons and medulla as well as the cerebellum present as well. In comparison with previous MRI, overall appearance has mildly progressed as evidence by a few scattered new and/or more prominent lesions as compared to previous. For example, few small juxta cortical lesions seen within the right parietal region appear to be new from previous (series 15, image 17). Few  of the additional pre cystic lesions or also mildly increased in size. No abnormal restricted diffusion or enhancement to suggest active demyelination on today's exam. No evidence for acute or subacute infarct. Gray-white matter differentiation maintained. No encephalomalacia to suggest chronic cortical infarction. No foci of susceptibility artifact to suggest acute or chronic intracranial hemorrhage. No mass lesion, midline shift or mass effect. No hydrocephalus. No extra-axial fluid collection. Pituitary gland suprasellar region normal. Midline structures intact. Vascular: Major intracranial vascular flow voids are maintained. Skull and upper cervical spine: Craniocervical junction within normal limits. Bone marrow signal intensity normal. 19 mm sclerotic lesion at the right frontal scalp noted, indeterminate, but stable from  previous, and of doubtful significance. Scalp soft tissues demonstrate no acute finding. Sinuses/Orbits: Globes and orbital soft tissues within normal limits. Paranasal sinuses are clear. No mastoid effusion. Inner ear structures normal. Other: None. MRI CERVICAL SPINE FINDINGS Alignment: Straightening of the normal cervical lordosis. No listhesis. Vertebrae: Vertebral body height maintained without evidence for acute or chronic fracture. Bone marrow signal intensity within normal limits. Benign hemangioma noted within the C7 vertebral body. No other discrete or worrisome osseous lesions. No abnormal marrow edema. Cord: Patchy signal abnormality within the right hemi cord at the level of C2 (series 4, image 8), not definite seen on previous. Additional patchy signal abnormality within the central cord at the level of C2-3 (series 4, image 11), more prominent as compared to previous. Patchy signal abnormality within the central cord at the level of C3, slightly more prominent (series 4, image 15). Focal signal abnormality within the left dorsal cord at the level of C3-4 (series 4, image 17), more prominent from previous. Minimal patchy signal abnormality within the central cord at the level of C4 (series 4, image 19), more prominent. Probable additional subtle lesion within the right dorsal cord at the level of C6 (series 4, image 29), also more prominent. Findings consistent with history of demyelinating disease, and appear progressed from previous. No abnormal enhancement to suggest active demyelination. Posterior Fossa, vertebral arteries, paraspinal tissues: Craniocervical junction within normal limits. Paraspinous and prevertebral soft tissues are normal. Normal intravascular flow voids seen within the vertebral arteries bilaterally. Disc levels: C2-C3: Mild disc bulge with left-sided uncovertebral hypertrophy. No significant spinal stenosis. Mild left C3 foraminal narrowing. C3-C4: Mild disc bulge with  left-sided uncovertebral hypertrophy. No significant spinal stenosis. Mild left C4 foraminal narrowing. C4-C5: Mild disc bulge with right-sided uncovertebral hypertrophy. Flattening of the ventral thecal sac without significant spinal stenosis. Moderate right C5 foraminal stenosis. C5-C6: Shallow right paracentral disc protrusion indents the right ventral thecal sac (series 5, image 27). Lateral extension into the right neural foramen. Mild spinal stenosis without cord impingement. Moderate right with mild left C6 foraminal narrowing. C6-C7: Shallow broad-based central disc protrusion indents the ventral thecal sac. Mild spinal stenosis or cord impingement. Superimposed uncovertebral hypertrophy with resultant mild left C7 foraminal stenosis. C7-T1:  Unremarkable. MRI THORACIC SPINE FINDINGS Alignment: Vertebral bodies normally aligned with preservation of the normal thoracic kyphosis. No listhesis or subluxation. Vertebrae: Vertebral body height maintained without evidence for acute or chronic fracture. Bone marrow signal intensity within normal limits. Few scattered benign hemangiomata noted, with small atypical hemangioma noted within the T5 vertebral body. No worrisome osseous lesions. No abnormal marrow edema or enhancement. Cord: Signal intensity within the thoracic spinal cord is within normal limits. No definite cord lesions to suggest demyelinating disease. Normal cord caliber and morphology. No abnormal enhancement. Paraspinal soft tissues: Paraspinous soft tissues within normal  limits. Partially visualized lungs are clear. Visualized visceral structures unremarkable. Disc levels: No significant disc pathology seen within the thoracic spine. No disc bulge or focal disc protrusion. No significant facet disease. No canal or neural foraminal stenosis. No impingement. IMPRESSION: MRI HEAD IMPRESSION: 1. Patchy multifocal signal changes involving the supratentorial and infratentorial cerebral white matter as  above, compatible with provided history of multiple sclerosis. Overall, changes are mildly progressed relative to previous MRI from 2019. No evidence for active demyelination. 2. No other acute intracranial abnormality. MRI CERVICAL SPINE IMPRESSION: 1. Patchy multifocal cord signal abnormality involving the cervical spinal cord as detailed above, consistent with history of multiple sclerosis. Overall, appearance appears mildly progressed relative to 2019. No evidence for active demyelination. 2. Degenerative spondylosis at C5-6 and C6-7 with resultant mild spinal stenosis, with moderate right C6 and mild left C7 foraminal narrowing. 3. Mild left C3 and C4 foraminal stenosis, with moderate right C5 foraminal narrowing related to disc bulge and uncovertebral disease. MRI THORACIC SPINE IMPRESSION: Normal MRI of the thoracic spine and spinal cord. No evidence for demyelinating disease. No significant disc pathology, stenosis, or neural impingement. Electronically Signed   By: Rise Mu M.D.   On: 03/31/2019 01:02   Mr Cervical Spine W Or Wo Contrast  Result Date: 03/31/2019 CLINICAL DATA:  Initial evaluation for increasing leg weakness, spasms, blurry vision. History of multiple sclerosis. EXAM: MRI HEAD WITHOUT AND WITH CONTRAST MRI CERVICAL SPINE WITHOUT AND WITH CONTRAST MRI THORACIC SPINE WITHOUT AND WITH CONTRAST TECHNIQUE: Multiplanar, multiecho pulse sequences of the brain and surrounding structures, and cervical spine, to include the craniocervical junction and cervicothoracic junction, as well as the thoracic spine were obtained without and with intravenous contrast. CONTRAST:  9mL GADAVIST GADOBUTROL 1 MMOL/ML IV SOLN COMPARISON:  Previous MRI from 02/05/2018. FINDINGS: MRI HEAD FINDINGS Brain: Cerebral volume stable, and remains within normal limits for age. Multiple scattered patchy T2/FLAIR hyperintensities seen involving the periventricular, deep, and juxta cortical white matter of both  cerebral hemispheres, consistent with provided history of multiple sclerosis. Scattered patchy involvement of the pons and medulla as well as the cerebellum present as well. In comparison with previous MRI, overall appearance has mildly progressed as evidence by a few scattered new and/or more prominent lesions as compared to previous. For example, few small juxta cortical lesions seen within the right parietal region appear to be new from previous (series 15, image 17). Few of the additional pre cystic lesions or also mildly increased in size. No abnormal restricted diffusion or enhancement to suggest active demyelination on today's exam. No evidence for acute or subacute infarct. Gray-white matter differentiation maintained. No encephalomalacia to suggest chronic cortical infarction. No foci of susceptibility artifact to suggest acute or chronic intracranial hemorrhage. No mass lesion, midline shift or mass effect. No hydrocephalus. No extra-axial fluid collection. Pituitary gland suprasellar region normal. Midline structures intact. Vascular: Major intracranial vascular flow voids are maintained. Skull and upper cervical spine: Craniocervical junction within normal limits. Bone marrow signal intensity normal. 19 mm sclerotic lesion at the right frontal scalp noted, indeterminate, but stable from previous, and of doubtful significance. Scalp soft tissues demonstrate no acute finding. Sinuses/Orbits: Globes and orbital soft tissues within normal limits. Paranasal sinuses are clear. No mastoid effusion. Inner ear structures normal. Other: None. MRI CERVICAL SPINE FINDINGS Alignment: Straightening of the normal cervical lordosis. No listhesis. Vertebrae: Vertebral body height maintained without evidence for acute or chronic fracture. Bone marrow signal intensity within normal limits. Benign hemangioma noted within  the C7 vertebral body. No other discrete or worrisome osseous lesions. No abnormal marrow edema. Cord:  Patchy signal abnormality within the right hemi cord at the level of C2 (series 4, image 8), not definite seen on previous. Additional patchy signal abnormality within the central cord at the level of C2-3 (series 4, image 11), more prominent as compared to previous. Patchy signal abnormality within the central cord at the level of C3, slightly more prominent (series 4, image 15). Focal signal abnormality within the left dorsal cord at the level of C3-4 (series 4, image 17), more prominent from previous. Minimal patchy signal abnormality within the central cord at the level of C4 (series 4, image 19), more prominent. Probable additional subtle lesion within the right dorsal cord at the level of C6 (series 4, image 29), also more prominent. Findings consistent with history of demyelinating disease, and appear progressed from previous. No abnormal enhancement to suggest active demyelination. Posterior Fossa, vertebral arteries, paraspinal tissues: Craniocervical junction within normal limits. Paraspinous and prevertebral soft tissues are normal. Normal intravascular flow voids seen within the vertebral arteries bilaterally. Disc levels: C2-C3: Mild disc bulge with left-sided uncovertebral hypertrophy. No significant spinal stenosis. Mild left C3 foraminal narrowing. C3-C4: Mild disc bulge with left-sided uncovertebral hypertrophy. No significant spinal stenosis. Mild left C4 foraminal narrowing. C4-C5: Mild disc bulge with right-sided uncovertebral hypertrophy. Flattening of the ventral thecal sac without significant spinal stenosis. Moderate right C5 foraminal stenosis. C5-C6: Shallow right paracentral disc protrusion indents the right ventral thecal sac (series 5, image 27). Lateral extension into the right neural foramen. Mild spinal stenosis without cord impingement. Moderate right with mild left C6 foraminal narrowing. C6-C7: Shallow broad-based central disc protrusion indents the ventral thecal sac. Mild spinal  stenosis or cord impingement. Superimposed uncovertebral hypertrophy with resultant mild left C7 foraminal stenosis. C7-T1:  Unremarkable. MRI THORACIC SPINE FINDINGS Alignment: Vertebral bodies normally aligned with preservation of the normal thoracic kyphosis. No listhesis or subluxation. Vertebrae: Vertebral body height maintained without evidence for acute or chronic fracture. Bone marrow signal intensity within normal limits. Few scattered benign hemangiomata noted, with small atypical hemangioma noted within the T5 vertebral body. No worrisome osseous lesions. No abnormal marrow edema or enhancement. Cord: Signal intensity within the thoracic spinal cord is within normal limits. No definite cord lesions to suggest demyelinating disease. Normal cord caliber and morphology. No abnormal enhancement. Paraspinal soft tissues: Paraspinous soft tissues within normal limits. Partially visualized lungs are clear. Visualized visceral structures unremarkable. Disc levels: No significant disc pathology seen within the thoracic spine. No disc bulge or focal disc protrusion. No significant facet disease. No canal or neural foraminal stenosis. No impingement. IMPRESSION: MRI HEAD IMPRESSION: 1. Patchy multifocal signal changes involving the supratentorial and infratentorial cerebral white matter as above, compatible with provided history of multiple sclerosis. Overall, changes are mildly progressed relative to previous MRI from 2019. No evidence for active demyelination. 2. No other acute intracranial abnormality. MRI CERVICAL SPINE IMPRESSION: 1. Patchy multifocal cord signal abnormality involving the cervical spinal cord as detailed above, consistent with history of multiple sclerosis. Overall, appearance appears mildly progressed relative to 2019. No evidence for active demyelination. 2. Degenerative spondylosis at C5-6 and C6-7 with resultant mild spinal stenosis, with moderate right C6 and mild left C7 foraminal  narrowing. 3. Mild left C3 and C4 foraminal stenosis, with moderate right C5 foraminal narrowing related to disc bulge and uncovertebral disease. MRI THORACIC SPINE IMPRESSION: Normal MRI of the thoracic spine and spinal cord. No evidence  for demyelinating disease. No significant disc pathology, stenosis, or neural impingement. Electronically Signed   By: Jeannine Boga M.D.   On: 03/31/2019 01:02   Mr Thoracic Spine W Wo Contrast  Result Date: 03/31/2019 CLINICAL DATA:  Initial evaluation for increasing leg weakness, spasms, blurry vision. History of multiple sclerosis. EXAM: MRI HEAD WITHOUT AND WITH CONTRAST MRI CERVICAL SPINE WITHOUT AND WITH CONTRAST MRI THORACIC SPINE WITHOUT AND WITH CONTRAST TECHNIQUE: Multiplanar, multiecho pulse sequences of the brain and surrounding structures, and cervical spine, to include the craniocervical junction and cervicothoracic junction, as well as the thoracic spine were obtained without and with intravenous contrast. CONTRAST:  70mL GADAVIST GADOBUTROL 1 MMOL/ML IV SOLN COMPARISON:  Previous MRI from 02/05/2018. FINDINGS: MRI HEAD FINDINGS Brain: Cerebral volume stable, and remains within normal limits for age. Multiple scattered patchy T2/FLAIR hyperintensities seen involving the periventricular, deep, and juxta cortical white matter of both cerebral hemispheres, consistent with provided history of multiple sclerosis. Scattered patchy involvement of the pons and medulla as well as the cerebellum present as well. In comparison with previous MRI, overall appearance has mildly progressed as evidence by a few scattered new and/or more prominent lesions as compared to previous. For example, few small juxta cortical lesions seen within the right parietal region appear to be new from previous (series 15, image 17). Few of the additional pre cystic lesions or also mildly increased in size. No abnormal restricted diffusion or enhancement to suggest active demyelination on  today's exam. No evidence for acute or subacute infarct. Gray-white matter differentiation maintained. No encephalomalacia to suggest chronic cortical infarction. No foci of susceptibility artifact to suggest acute or chronic intracranial hemorrhage. No mass lesion, midline shift or mass effect. No hydrocephalus. No extra-axial fluid collection. Pituitary gland suprasellar region normal. Midline structures intact. Vascular: Major intracranial vascular flow voids are maintained. Skull and upper cervical spine: Craniocervical junction within normal limits. Bone marrow signal intensity normal. 19 mm sclerotic lesion at the right frontal scalp noted, indeterminate, but stable from previous, and of doubtful significance. Scalp soft tissues demonstrate no acute finding. Sinuses/Orbits: Globes and orbital soft tissues within normal limits. Paranasal sinuses are clear. No mastoid effusion. Inner ear structures normal. Other: None. MRI CERVICAL SPINE FINDINGS Alignment: Straightening of the normal cervical lordosis. No listhesis. Vertebrae: Vertebral body height maintained without evidence for acute or chronic fracture. Bone marrow signal intensity within normal limits. Benign hemangioma noted within the C7 vertebral body. No other discrete or worrisome osseous lesions. No abnormal marrow edema. Cord: Patchy signal abnormality within the right hemi cord at the level of C2 (series 4, image 8), not definite seen on previous. Additional patchy signal abnormality within the central cord at the level of C2-3 (series 4, image 11), more prominent as compared to previous. Patchy signal abnormality within the central cord at the level of C3, slightly more prominent (series 4, image 15). Focal signal abnormality within the left dorsal cord at the level of C3-4 (series 4, image 17), more prominent from previous. Minimal patchy signal abnormality within the central cord at the level of C4 (series 4, image 19), more prominent. Probable  additional subtle lesion within the right dorsal cord at the level of C6 (series 4, image 29), also more prominent. Findings consistent with history of demyelinating disease, and appear progressed from previous. No abnormal enhancement to suggest active demyelination. Posterior Fossa, vertebral arteries, paraspinal tissues: Craniocervical junction within normal limits. Paraspinous and prevertebral soft tissues are normal. Normal intravascular flow voids seen within  the vertebral arteries bilaterally. Disc levels: C2-C3: Mild disc bulge with left-sided uncovertebral hypertrophy. No significant spinal stenosis. Mild left C3 foraminal narrowing. C3-C4: Mild disc bulge with left-sided uncovertebral hypertrophy. No significant spinal stenosis. Mild left C4 foraminal narrowing. C4-C5: Mild disc bulge with right-sided uncovertebral hypertrophy. Flattening of the ventral thecal sac without significant spinal stenosis. Moderate right C5 foraminal stenosis. C5-C6: Shallow right paracentral disc protrusion indents the right ventral thecal sac (series 5, image 27). Lateral extension into the right neural foramen. Mild spinal stenosis without cord impingement. Moderate right with mild left C6 foraminal narrowing. C6-C7: Shallow broad-based central disc protrusion indents the ventral thecal sac. Mild spinal stenosis or cord impingement. Superimposed uncovertebral hypertrophy with resultant mild left C7 foraminal stenosis. C7-T1:  Unremarkable. MRI THORACIC SPINE FINDINGS Alignment: Vertebral bodies normally aligned with preservation of the normal thoracic kyphosis. No listhesis or subluxation. Vertebrae: Vertebral body height maintained without evidence for acute or chronic fracture. Bone marrow signal intensity within normal limits. Few scattered benign hemangiomata noted, with small atypical hemangioma noted within the T5 vertebral body. No worrisome osseous lesions. No abnormal marrow edema or enhancement. Cord: Signal  intensity within the thoracic spinal cord is within normal limits. No definite cord lesions to suggest demyelinating disease. Normal cord caliber and morphology. No abnormal enhancement. Paraspinal soft tissues: Paraspinous soft tissues within normal limits. Partially visualized lungs are clear. Visualized visceral structures unremarkable. Disc levels: No significant disc pathology seen within the thoracic spine. No disc bulge or focal disc protrusion. No significant facet disease. No canal or neural foraminal stenosis. No impingement. IMPRESSION: MRI HEAD IMPRESSION: 1. Patchy multifocal signal changes involving the supratentorial and infratentorial cerebral white matter as above, compatible with provided history of multiple sclerosis. Overall, changes are mildly progressed relative to previous MRI from 2019. No evidence for active demyelination. 2. No other acute intracranial abnormality. MRI CERVICAL SPINE IMPRESSION: 1. Patchy multifocal cord signal abnormality involving the cervical spinal cord as detailed above, consistent with history of multiple sclerosis. Overall, appearance appears mildly progressed relative to 2019. No evidence for active demyelination. 2. Degenerative spondylosis at C5-6 and C6-7 with resultant mild spinal stenosis, with moderate right C6 and mild left C7 foraminal narrowing. 3. Mild left C3 and C4 foraminal stenosis, with moderate right C5 foraminal narrowing related to disc bulge and uncovertebral disease. MRI THORACIC SPINE IMPRESSION: Normal MRI of the thoracic spine and spinal cord. No evidence for demyelinating disease. No significant disc pathology, stenosis, or neural impingement. Electronically Signed   By: Rise MuBenjamin  McClintock M.D.   On: 03/31/2019 01:02       Discharge Exam: Vitals:   04/01/19 2120 04/02/19 0524  BP: 136/77 (!) 115/91  Pulse: 92 94  Resp: 17 17  Temp: 98.9 F (37.2 C) 99.1 F (37.3 C)  SpO2: 93% 95%     General: Pt is alert, awake, not in  acute distress Cardiovascular: RRR, S1/S2 +, no edema Respiratory: CTA bilaterally, no wheezing, no rhonchi, no respiratory distress, no conversational dyspnea  Abdominal: Soft, NT, ND, bowel sounds + Extremities: trace edema, no cyanosis Psych: Normal mood and affect, stable judgement and insight     The results of significant diagnostics from this hospitalization (including imaging, microbiology, ancillary and laboratory) are listed below for reference.     Microbiology: Recent Results (from the past 240 hour(s))  SARS CORONAVIRUS 2 (TAT 6-24 HRS) Nasopharyngeal Nasopharyngeal Swab     Status: None   Collection Time: 03/30/19  7:27 PM   Specimen: Nasopharyngeal Swab  Result Value Ref Range Status   SARS Coronavirus 2 NEGATIVE NEGATIVE Final    Comment: (NOTE) SARS-CoV-2 target nucleic acids are NOT DETECTED. The SARS-CoV-2 RNA is generally detectable in upper and lower respiratory specimens during the acute phase of infection. Negative results do not preclude SARS-CoV-2 infection, do not rule out co-infections with other pathogens, and should not be used as the sole basis for treatment or other patient management decisions. Negative results must be combined with clinical observations, patient history, and epidemiological information. The expected result is Negative. Fact Sheet for Patients: HairSlick.no Fact Sheet for Healthcare Providers: quierodirigir.com This test is not yet approved or cleared by the [place] FDA and  has been authorized for detection and/or diagnosis of SARS-CoV-2 by FDA under an Emergency Use Authorization (EUA). This EUA will remain  in effect (meaning this test can be used) for the duration of the COVID-19 declaration under Section 56 4(b)(1) of the Act, 21 U.S.C. section 360bbb-3(b)(1), unless the authorization is terminated or revoked sooner. Performed at [facility], [address], [place], [place] [address]      Labs: BNP (last 3 results) No results for input(s): BNP in the last 8760 hours. Basic Metabolic Panel: Recent Labs  Lab 03/30/19 1803 03/31/19 0551 04/01/19 0227 04/02/19 0258  NA 136  --  140 141  K 4.0  --  4.3 4.1  CL 101  --  111 111  CO2 24  --  21* 21*  GLUCOSE 99  --  201* 154*  BUN 13  --  15 17  CREATININE 0.86  --  0.89 0.79  CALCIUM 9.6  --  9.4 9.2  MG  --  2.2  --   --   PHOS  --  4.1  --   --    Liver Function Tests: Recent Labs  Lab 04/01/19 0227 04/02/19 0258  AST 15 11*  ALT 19 16  ALKPHOS 82 68  BILITOT 0.4 0.2*  PROT 6.5 5.9*  ALBUMIN 3.5 3.2*   No results for input(s): LIPASE, AMYLASE in the last 168 hours. No results for input(s): AMMONIA in the last 168 hours. CBC: Recent Labs  Lab 03/30/19 1803 04/01/19 0227 04/02/19 0258  WBC 11.0* 6.5 15.5*  HGB 15.5* 15.0 13.7  HCT 48.9* 44.9 41.4  MCV 102.5* 98.0 99.3  PLT 271 252 256   Cardiac Enzymes: No results for input(s): CKTOTAL, CKMB, CKMBINDEX, TROPONINI in the last 168 hours. BNP: Invalid input(s): POCBNP CBG: No results for input(s): GLUCAP in the last 168 hours. D-Dimer No results for input(s): DDIMER in the last 72 hours. Hgb A1c No results for input(s): HGBA1C in the last 72 hours. Lipid Profile No results for input(s): CHOL, HDL, LDLCALC, TRIG, CHOLHDL, LDLDIRECT in the last 72 hours. Thyroid function studies Recent Labs    03/31/19 0551  TSH 1.184   Anemia work up No results for input(s): VITAMINB12, FOLATE, FERRITIN, TIBC, IRON, RETICCTPCT in the last 72 hours. Urinalysis    Component Value Date/Time   COLORURINE YELLOW 03/31/2019 0743   APPEARANCEUR CLEAR 03/31/2019 0743   APPEARANCEUR Clear 04/14/2018 0841   LABSPEC 1.026 03/31/2019 0743   PHURINE 5.0 03/31/2019 0743   GLUCOSEU NEGATIVE 03/31/2019 0743   HGBUR NEGATIVE 03/31/2019 0743   BILIRUBINUR NEGATIVE 03/31/2019 0743   BILIRUBINUR Negative 04/14/2018 0841    KETONESUR 20 (A) 03/31/2019 0743   PROTEINUR NEGATIVE 03/31/2019 0743   NITRITE NEGATIVE 03/31/2019 0743   LEUKOCYTESUR NEGATIVE 03/31/2019 0743  Sepsis Labs Invalid input(s): PROCALCITONIN,  WBC,  LACTICIDVEN Microbiology Recent Results (from the past 240 hour(s))  SARS CORONAVIRUS 2 (TAT 6-24 HRS) Nasopharyngeal Nasopharyngeal Swab     Status: None   Collection Time: 03/30/19  7:27 PM   Specimen: Nasopharyngeal Swab  Result Value Ref Range Status   SARS Coronavirus 2 NEGATIVE NEGATIVE Final    Comment: (NOTE) SARS-CoV-2 target nucleic acids are NOT DETECTED. The SARS-CoV-2 RNA is generally detectable in upper and lower respiratory specimens during the acute phase of infection. Negative results do not preclude SARS-CoV-2 infection, do not rule out co-infections with other pathogens, and should not be used as the sole basis for treatment or other patient management decisions. Negative results must be combined with clinical observations, patient history, and epidemiological information. The expected result is Negative. Fact Sheet for Patients: HairSlick.no Fact Sheet for Healthcare Providers: quierodirigir.com This test is not yet approved or cleared by the [place] FDA and  has been authorized for detection and/or diagnosis of SARS-CoV-2 by FDA under an Emergency Use Authorization (EUA). This EUA will remain  in effect (meaning this test can be used) for the duration of the COVID-19 declaration under Section 56 4(b)(1) of the Act, 21 U.S.C. section 360bbb-3(b)(1), unless the authorization is terminated or revoked sooner. Performed at [facility], [address], [place], [place] [address]      Patient was seen and examined on the day of discharge and was found to be in stable condition. Time coordinating discharge: 35 minutes including assessment and coordination of care, as well as examination of the patient.    SIGNED:  Noralee Stain, DO [facility] 04/02/2019, 9:38 AM

## 2019-04-02 NOTE — Progress Notes (Signed)
SLP Cancellation Note  Patient Details Name: Sharon Odom MRN: [id number] DOB: [birth year]   Cancelled treatment:       Reason Eval/Treat Not Completed: Patient unavailable: Note pt transferring to CIR today. Recommend ST follow up for dysphagia at next venue.   Carmela Rima, [place] Speech Language Pathologist Office: [phone] Pager: [phone]  Shonna Chock 04/02/2019, 10:03 AM

## 2019-04-02 NOTE — Plan of Care (Signed)

## 2019-04-02 NOTE — H&P (Signed)
Physical Medicine and Rehabilitation Admission H&P    Chief Complaint  Patient presents with  . Weakness  : HPI: Sharon Odom is a 48 year old right-handed female with history of asthma with tobacco abuse, anxiety/depression as well as multiple sclerosis diagnosed 2013 followed outpatient by neurology services Dr. Epimenio Foot maintained on [place] as well as dalfampridine.  Patient with recent placement of baclofen pump 02/18/2019 per Dr. Ollen Bowl for increasing spasticity related to her multiple sclerosis.  Per chart review patient lives with spouse.  Two-level home with bed and bath on main level and 5 steps to entry.  Independent with assistive device prior to admission.  Presented 03/31/2019 with increasing lower extremity weakness, dysphagia, muscle spasms, blurred vision and worsening of chronic left hand weakness.  She did recently see her ophthalmologist for blurred vision as report no acute findings noted.  Admission chemistries unremarkable, WBC 11,000, SARS Covid negative.  MRI of the brain, cervical and thoracic spine with and without contrast obtained revealed multiple chronic demyelinating lesions without acute enhancement.  Neurology follow-up it was advised to begin IV Solu-Medrol x3 doses 03/31/2019 for suspected MS exacerbation.  Subcutaneous Lovenox added for DVT prophylaxis.  Speech therapy evaluation for dysphagia diet has steadily been advanced now on a regular consistency.  Therapy evaluations completed and patient was admitted for a comprehensive rehab program.  Review of Systems  Constitutional: Negative for chills and fever.  HENT: Negative for hearing loss.   Eyes: Positive for blurred vision.  Respiratory: Negative for cough.        Some increasing shortness of breath with heavy exertion  Cardiovascular: Positive for leg swelling. Negative for chest pain and palpitations.  Gastrointestinal: Positive for constipation. Negative for heartburn and nausea.       GERD   Genitourinary: Positive for urgency. Negative for dysuria, flank pain and hematuria.  Musculoskeletal: Positive for joint pain and myalgias.  Skin: Negative for rash.  Neurological: Positive for weakness and headaches.  Psychiatric/Behavioral: Positive for depression. The patient has insomnia.        Anxiety  All other systems reviewed and are negative.  Past Medical History:  Diagnosis Date  . Allergies   . Anxiety   . Arthritis   . Asthma   . Constipation   . Depression   . GERD (gastroesophageal reflux disease)   . Headache   . History of hiatal hernia   . Multiple sclerosis (HCC)   . Vision abnormalities   . Wears glasses    Past Surgical History:  Procedure Laterality Date  . EXCISION MORTON'S NEUROMA Right   . fallopian tube removal    . INTRATHECAL PUMP IMPLANT Right 02/18/2019   Procedure: INTRATHECAL PUMP IMPLANT;  Surgeon: Odette Fraction, MD;  Location: [facility];  Service: Neurosurgery;  Laterality: Right;  INTRATHECAL PUMP IMPLANT  . WISDOM TOOTH EXTRACTION     Family History  Problem Relation Age of Onset  . Diabetes Mother   . Healthy Brother    Social History:  reports that she has been smoking cigarettes. She has been smoking about 0.50 packs per day. She has never used smokeless tobacco. She reports current alcohol use. She reports that she does not use drugs. Allergies:  Allergies  Allergen Reactions  . Morphine Other (See Comments)   Medications Prior to Admission  Medication Sig Dispense Refill  . acetaminophen (TYLENOL) 650 MG CR tablet Take 1,300 mg by mouth every 8 (eight) hours as needed for pain.     Marland Kitchen albuterol (PROVENTIL) (2.5  MG/3ML) 0.083% nebulizer solution Take 2.5 mg by nebulization every 6 (six) hours as needed for wheezing or shortness of breath.    Marland Kitchen. albuterol (VENTOLIN HFA) 108 (90 Base) MCG/ACT inhaler Inhale 1-2 puffs into the lungs every 6 (six) hours as needed for wheezing or shortness of breath.     . Alemtuzumab (LEMTRADA) 12  MG/1.2ML SOLN Inject 12 mg into the vein See admin instructions. Once a year    . baclofen (LIORESAL) 20 MG tablet 1 PO Q6 hours PRN for breakthrough spasms (Patient taking differently: Take 20 mg by mouth 4 (four) times daily. ) 120 each 3  . botulinum toxin Type A (BOTOX) 100 units SOLR injection Inject 600 Units into the muscle every 3 (three) months.    Marland Kitchen. buPROPion (WELLBUTRIN XL) 300 MG 24 hr tablet Take 300 mg by mouth daily.     . Cholecalciferol (VITAMIN D-3) 5000 units TABS Take 5,000 Units by mouth daily.     . Cyanocobalamin (VITAMIN B-12) 2500 MCG SUBL Place 2,500 mcg under the tongue daily.     Marland Kitchen. dalfampridine 10 MG TB12 One po bid (Patient taking differently: Take 10 mg by mouth 2 (two) times daily. One po bid) 60 tablet 11  . diazepam (VALIUM) 5 MG tablet Take up to 3 pills a day for spasticity (Patient taking differently: Take 5-10 mg by mouth See admin instructions. Take up to 3 pills a day for spasticity) 90 tablet 3  . DULoxetine (CYMBALTA) 30 MG capsule Take 1 capsule (30 mg total) by mouth 2 (two) times daily. 60 capsule 3  . ibuprofen (ADVIL,MOTRIN) 200 MG tablet Take 400 mg by mouth every 8 (eight) hours as needed for moderate pain.     . montelukast (SINGULAIR) 10 MG tablet Take 10 mg by mouth daily.     . ondansetron (ZOFRAN) 8 MG tablet as needed With infusions (Patient taking differently: Take 8 mg by mouth every 8 (eight) hours as needed for nausea or vomiting. as needed With infusions) 20 tablet 1  . oxybutynin (DITROPAN-XL) 10 MG 24 hr tablet Take 10 mg by mouth daily.    Marland Kitchen. oxyCODONE-acetaminophen (PERCOCET) 10-325 MG tablet Take 1 tablet by mouth 2 (two) times daily as needed for pain. 60 tablet 0  . ranitidine (ZANTAC) 300 MG tablet Take 300 mg by mouth daily as needed for heartburn.    . cyclobenzaprine (FLEXERIL) 10 MG tablet Take 1 tablet (10 mg total) by mouth 3 (three) times daily. (Patient not taking: Reported on 03/31/2019) 90 tablet 5    Drug Regimen Review  Drug regimen was reviewed and remains appropriate with no significant issues identified  Home: Home Living Family/patient expects to be discharged to:: Private residence Living Arrangements: Spouse/significant other Available Help at Discharge: Family, Available PRN/intermittently Type of Home: House Home Access: Stairs to enter Secretary/administratorntrance Stairs-Number of Steps: 5 Entrance Stairs-Rails: Can reach both Home Layout: Two level, Able to live on main level with bedroom/bathroom(upstairs in gues living) Bathroom Shower/Tub: Walk-in shower(6-inch step in) FirefighterBathroom Toilet: Handicapped height Bathroom Accessibility: Yes Home Equipment: Grab bars - tub/shower, Environmental consultantWalker - 4 wheels, Shower seat Additional Comments: Husband works, pt reports having supportive neighbors who are home during day and can provide assist as needed. Plans to get home remodeled to include larger walk-in shower   Functional History: Prior Function Level of Independence: Independent with assistive device(s) Comments: Prior to exacerbation, pt mod indep with rollator. Since last weekend, reports "crab walking" requiring assist from husband for standing  and transfers due to weakness and other symptoms  Functional Status:  Mobility: Bed Mobility Overal bed mobility: Needs Assistance Bed Mobility: Supine to Sit Supine to sit: Min guard, Mod assist, HOB elevated General bed mobility comments: MIN/guard to initiate transfer with use of leg lifter , but needed MOD A at end of transfer to tuse bed pad to get hips fully turned Transfers Overall transfer level: Needs assistance Equipment used: Rolling walker (2 wheeled) Transfers: Sit to/from Stand Sit to Stand: Min assist, From elevated surface Stand pivot transfers: Min assist, +2 safety/equipment General transfer comment: Improved transfer today with no loss of balance and good use of UE Ambulation/Gait Ambulation/Gait assistance: Min assist Gait Distance (Feet): 3 Feet  Assistive device: Rolling walker (2 wheeled) Gait Pattern/deviations: Step-through pattern, Decreased step length - left, Decreased dorsiflexion - left General Gait Details: AMb from bed to window with L foot drag and then was able to pivot backwards to recliner    ADL: ADL Overall ADL's : Needs assistance/impaired Eating/Feeding: Modified independent Eating/Feeding Details (indicate cue type and reason): able to hold plastic utensitl and open bottle;reports she has adaptive equipment at home to cut food;husband also assists with meal prep Grooming: Minimal assistance, Sitting Grooming Details (indicate cue type and reason): decreased use of LUE Upper Body Bathing: Set up, Sitting Lower Body Bathing: Minimal assistance, Sit to/from stand Upper Body Dressing : Set up, Sitting Lower Body Dressing: Minimal assistance, Sit to/from stand Toilet Transfer: Minimal assistance, Stand-pivot, RW Toilet Transfer Details (indicate cue type and reason): simulated from EOB to recliner Toileting- Clothing Manipulation and Hygiene: Minimal assistance, Sit to/from stand Functional mobility during ADLs: Minimal assistance, Rolling walker  Cognition: Cognition Overall Cognitive Status: Within Functional Limits for tasks assessed Orientation Level: Oriented X4 Cognition Arousal/Alertness: Awake/alert Behavior During Therapy: WFL for tasks assessed/performed Overall Cognitive Status: Within Functional Limits for tasks assessed General Comments: Good awareness of medical history and current condition  Physical Exam: Blood pressure 130/78, pulse 60, temperature 98.4 F (36.9 C), temperature source Oral, resp. rate 16, height 5\' 9"  (1.753 m), weight 89 kg, SpO2 97 %. General: Alert and oriented x 3, No apparent distress HEENT: Head is normocephalic, atraumatic, PERRLA, EOMI, sclera anicteric, oral mucosa pink and moist, dentition intact, ext ear canals clear,  Neck: Supple without JVD or lymphadenopathy  Heart: Reg rate and rhythm. No murmurs rubs or gallops Chest: CTA bilaterally without wheezes, rales, or rhonchi; no distress Abdomen: Soft, non-tender, non-distended, bowel sounds positive. Extremities: No clubbing, cyanosis, or edema. Pulses are 2+ Skin: Clean and intact without signs of breakdown. LLE edematous.  Neuro: Pt is cognitively appropriate with normal insight, memory, and awareness. Makes good eye contact with examiner follows full commands. Fair awareness of her deficits. Cranial nerves 2-12 are intact. Sensory exam is normal. Fine motor coordination is intact. No tremors.  5/5 strength in upper extremities. 4-/5 throughout RLE. Unable to move LLE at this time, but patient states her edema is worse than usual.   Musculoskeletal: Full ROM, No pain with AROM or PROM in the neck, trunk, or extremities. Posture appropriate Psych: Pt's affect is appropriate. Pt is cooperative  Results for orders placed or performed during the hospital encounter of 03/30/19 (from the past 48 hour(s))  Comprehensive metabolic panel     Status: Abnormal   Collection Time: 04/02/19  2:58 AM  Result Value Ref Range   Sodium 141 135 - 145 mmol/L   Potassium 4.1 3.5 - 5.1 mmol/L   Chloride 111 98 -  111 mmol/L   CO2 21 (L) 22 - 32 mmol/L   Glucose, Bld 154 (H) 70 - 99 mg/dL   BUN 17 6 - 20 mg/dL   Creatinine, Ser 8.85 0.44 - 1.00 mg/dL   Calcium 9.2 8.9 - 02.7 mg/dL   Total Protein 5.9 (L) 6.5 - 8.1 g/dL   Albumin 3.2 (L) 3.5 - 5.0 g/dL   AST 11 (L) 15 - 41 U/L   ALT 16 0 - 44 U/L   Alkaline Phosphatase 68 38 - 126 U/L   Total Bilirubin 0.2 (L) 0.3 - 1.2 mg/dL   GFR calc non Af Amer >60 >60 mL/min   GFR calc Af Amer >60 >60 mL/min   Anion gap 9 5 - 15    Comment: Performed at [facility], [address], [place], [place] [address]  CBC     Status: Abnormal   Collection Time: 04/02/19  2:58 AM  Result Value Ref Range   WBC 15.5 (H) 4.0 - 10.5 K/uL   RBC 4.17 3.87 - 5.11 MIL/uL    Hemoglobin 13.7 12.0 - 15.0 g/dL   HCT 78.6 76.7 - 20.9 %   MCV 99.3 80.0 - 100.0 fL   MCH 32.9 26.0 - 34.0 pg   MCHC 33.1 30.0 - 36.0 g/dL   RDW 47.0 96.2 - 83.6 %   Platelets 256 150 - 400 K/uL   nRBC 0.0 0.0 - 0.2 %    Comment: Performed at [facility], [address] [place], [place] [address]   No results found.  Medical Problem List and Plan: 1.  Decreased functional ability with increasing muscle weakness spasms and blurred vision secondary to MS exacerbation.  3 days IV Solu-Medrol completed.  Followed by Dr. Epimenio Foot neurology services outpatient maintained on [place] as welll as dalfampridine  -CIR therapies including PT and OT 3 hours per day at least 5 days per week.  2.  Antithrombotics: -DVT/anticoagulation: Lovenox.  Check vascular study  -antiplatelet therapy: N/A 3. Pain Management: Recent insertion of intrathecal baclofen pump 02/18/2019 for spasticity, baclofen 20 mg 3 times daily, Cymbalta 30 mg twice daily, Valium 5 mg every 8 hours as needed, Advil as needed as well as oxycodone 4. Mood: Wellbutrin 300 mg daily, Cymbalta 20mg  BID  -antipsychotic agents: N/A 5. Neuropsych: This patient is capable of making decisions on her own behalf. 6. Skin/Wound Care: Routine skin checks 7. Fluids/Electrolytes/Nutrition: Routine in and outs with follow-up chemistries 8.  Asthma with history of tobacco abuse.  Continue nebulizers as needed as well as scheduled Singulair. 9.  GERD.  Pepcid Disposition: Upon discharge from acute rehabilitation, Mrs. Seufert should have outpatient follow-up with PCP, physiatry, neurology (Dr. Herold Harms), and neurosurgery (Dr. Epimenio Foot).   Ollen Bowl Angiulli, PA-C 04/03/2019   I have personally performed a face to face diagnostic evaluation, including, but not limited to relevant history and physical exam findings, of this patient and developed relevant assessment and plan.  Additionally, I have reviewed and concur with the physician assistant's  documentation above.  13/10/2018, MD

## 2019-04-02 NOTE — Progress Notes (Signed)
Inpatient Rehabilitation-Admissions Coordinator   I await insurance determination regarding CIR. Will follow up once there has been a determination.   Jhonnie Garner, OTR/L  Rehab Admissions Coordinator  [phone] 04/02/2019 3:10 PM

## 2019-04-03 ENCOUNTER — Inpatient Hospital Stay (HOSPITAL_COMMUNITY)
Admission: RE | Admit: 2019-04-03 | Discharge: 2019-04-16 | DRG: 059 | Disposition: A | Discharge status: Home Health | Payer: BC Managed Care – PPO | Source: Intra-hospital | Attending: Physical Medicine and Rehabilitation | Admitting: Physical Medicine and Rehabilitation

## 2019-04-03 ENCOUNTER — Other Ambulatory Visit: Payer: Self-pay

## 2019-04-03 ENCOUNTER — Encounter (HOSPITAL_COMMUNITY): Payer: Self-pay

## 2019-04-03 DIAGNOSIS — K592 Neurogenic bowel, not elsewhere classified: Secondary | ICD-10-CM

## 2019-04-03 DIAGNOSIS — R110 Nausea: Secondary | ICD-10-CM | POA: Diagnosis not present

## 2019-04-03 DIAGNOSIS — E46 Unspecified protein-calorie malnutrition: Secondary | ICD-10-CM | POA: Diagnosis present

## 2019-04-03 DIAGNOSIS — T380X5A Adverse effect of glucocorticoids and synthetic analogues, initial encounter: Secondary | ICD-10-CM | POA: Diagnosis present

## 2019-04-03 DIAGNOSIS — J45909 Unspecified asthma, uncomplicated: Secondary | ICD-10-CM | POA: Diagnosis present

## 2019-04-03 DIAGNOSIS — Z6830 Body mass index (BMI) 30.0-30.9, adult: Secondary | ICD-10-CM | POA: Diagnosis not present

## 2019-04-03 DIAGNOSIS — F419 Anxiety disorder, unspecified: Secondary | ICD-10-CM | POA: Diagnosis present

## 2019-04-03 DIAGNOSIS — E8809 Other disorders of plasma-protein metabolism, not elsewhere classified: Secondary | ICD-10-CM | POA: Diagnosis present

## 2019-04-03 DIAGNOSIS — R1310 Dysphagia, unspecified: Secondary | ICD-10-CM | POA: Diagnosis present

## 2019-04-03 DIAGNOSIS — Z978 Presence of other specified devices: Secondary | ICD-10-CM | POA: Diagnosis not present

## 2019-04-03 DIAGNOSIS — Z9689 Presence of other specified functional implants: Secondary | ICD-10-CM | POA: Diagnosis present

## 2019-04-03 DIAGNOSIS — Z79899 Other long term (current) drug therapy: Secondary | ICD-10-CM

## 2019-04-03 DIAGNOSIS — R269 Unspecified abnormalities of gait and mobility: Secondary | ICD-10-CM | POA: Diagnosis not present

## 2019-04-03 DIAGNOSIS — G8220 Paraplegia, unspecified: Secondary | ICD-10-CM | POA: Diagnosis present

## 2019-04-03 DIAGNOSIS — Z791 Long term (current) use of non-steroidal anti-inflammatories (NSAID): Secondary | ICD-10-CM | POA: Diagnosis not present

## 2019-04-03 DIAGNOSIS — R627 Adult failure to thrive: Secondary | ICD-10-CM | POA: Diagnosis present

## 2019-04-03 DIAGNOSIS — Z833 Family history of diabetes mellitus: Secondary | ICD-10-CM | POA: Diagnosis not present

## 2019-04-03 DIAGNOSIS — K219 Gastro-esophageal reflux disease without esophagitis: Secondary | ICD-10-CM | POA: Diagnosis present

## 2019-04-03 DIAGNOSIS — M62838 Other muscle spasm: Secondary | ICD-10-CM | POA: Diagnosis present

## 2019-04-03 DIAGNOSIS — Z885 Allergy status to narcotic agent status: Secondary | ICD-10-CM

## 2019-04-03 DIAGNOSIS — H538 Other visual disturbances: Secondary | ICD-10-CM | POA: Diagnosis present

## 2019-04-03 DIAGNOSIS — F17210 Nicotine dependence, cigarettes, uncomplicated: Secondary | ICD-10-CM | POA: Diagnosis present

## 2019-04-03 DIAGNOSIS — G4700 Insomnia, unspecified: Secondary | ICD-10-CM | POA: Diagnosis present

## 2019-04-03 DIAGNOSIS — R531 Weakness: Secondary | ICD-10-CM | POA: Diagnosis not present

## 2019-04-03 DIAGNOSIS — G9341 Metabolic encephalopathy: Secondary | ICD-10-CM | POA: Diagnosis present

## 2019-04-03 DIAGNOSIS — R739 Hyperglycemia, unspecified: Secondary | ICD-10-CM | POA: Diagnosis not present

## 2019-04-03 DIAGNOSIS — M7989 Other specified soft tissue disorders: Secondary | ICD-10-CM | POA: Diagnosis not present

## 2019-04-03 DIAGNOSIS — G629 Polyneuropathy, unspecified: Secondary | ICD-10-CM | POA: Diagnosis present

## 2019-04-03 DIAGNOSIS — Z79891 Long term (current) use of opiate analgesic: Secondary | ICD-10-CM

## 2019-04-03 DIAGNOSIS — R490 Dysphonia: Secondary | ICD-10-CM | POA: Diagnosis not present

## 2019-04-03 DIAGNOSIS — K5900 Constipation, unspecified: Secondary | ICD-10-CM | POA: Diagnosis present

## 2019-04-03 DIAGNOSIS — Y92239 Unspecified place in hospital as the place of occurrence of the external cause: Secondary | ICD-10-CM | POA: Diagnosis not present

## 2019-04-03 DIAGNOSIS — F329 Major depressive disorder, single episode, unspecified: Secondary | ICD-10-CM | POA: Diagnosis present

## 2019-04-03 DIAGNOSIS — G35 Multiple sclerosis: Secondary | ICD-10-CM | POA: Diagnosis not present

## 2019-04-03 MED ORDER — OXYCODONE HCL 5 MG PO TABS
5.0000 mg | ORAL_TABLET | Freq: Once | ORAL | Status: AC
  Administered 2019-04-03: 5 mg via ORAL
  Filled 2019-04-03: qty 1

## 2019-04-03 MED ORDER — BUPROPION HCL ER (XL) 300 MG PO TB24
300.0000 mg | ORAL_TABLET | Freq: Every day | ORAL | Status: DC
  Administered 2019-04-04 – 2019-04-16 (×13): 300 mg via ORAL
  Filled 2019-04-03 (×13): qty 1

## 2019-04-03 MED ORDER — POLYETHYLENE GLYCOL 3350 17 G PO PACK
17.0000 g | PACK | Freq: Every day | ORAL | Status: DC
  Administered 2019-04-04 – 2019-04-16 (×13): 17 g via ORAL
  Filled 2019-04-03 (×13): qty 1

## 2019-04-03 MED ORDER — OXYCODONE-ACETAMINOPHEN 5-325 MG PO TABS
1.0000 | ORAL_TABLET | Freq: Once | ORAL | Status: AC
  Administered 2019-04-03: 1 via ORAL
  Filled 2019-04-03: qty 1

## 2019-04-03 MED ORDER — ENOXAPARIN SODIUM 40 MG/0.4ML ~~LOC~~ SOLN
40.0000 mg | SUBCUTANEOUS | Status: DC
  Administered 2019-04-04 – 2019-04-16 (×13): 40 mg via SUBCUTANEOUS
  Filled 2019-04-03 (×13): qty 0.4

## 2019-04-03 MED ORDER — MONTELUKAST SODIUM 10 MG PO TABS
10.0000 mg | ORAL_TABLET | Freq: Every day | ORAL | Status: DC
  Administered 2019-04-04 – 2019-04-16 (×13): 10 mg via ORAL
  Filled 2019-04-03 (×14): qty 1

## 2019-04-03 MED ORDER — ACETAMINOPHEN 325 MG PO TABS
325.0000 mg | ORAL_TABLET | ORAL | Status: DC | PRN
  Administered 2019-04-04 – 2019-04-15 (×42): 650 mg via ORAL
  Administered 2019-04-16: 325 mg via ORAL
  Administered 2019-04-16: 650 mg via ORAL
  Filled 2019-04-03 (×12): qty 2
  Filled 2019-04-03: qty 1
  Filled 2019-04-03 (×33): qty 2

## 2019-04-03 MED ORDER — ALBUTEROL SULFATE (2.5 MG/3ML) 0.083% IN NEBU
2.5000 mg | INHALATION_SOLUTION | Freq: Four times a day (QID) | RESPIRATORY_TRACT | Status: DC | PRN
  Administered 2019-04-04 – 2019-04-12 (×6): 2.5 mg via RESPIRATORY_TRACT
  Filled 2019-04-03 (×6): qty 3

## 2019-04-03 MED ORDER — DIAZEPAM 5 MG PO TABS
5.0000 mg | ORAL_TABLET | Freq: Three times a day (TID) | ORAL | Status: DC | PRN
  Administered 2019-04-11: 5 mg via ORAL
  Filled 2019-04-03: qty 1

## 2019-04-03 MED ORDER — VITAMIN B-12 1000 MCG PO TABS
2500.0000 ug | ORAL_TABLET | Freq: Every day | ORAL | Status: DC
  Administered 2019-04-04 – 2019-04-16 (×13): 2500 ug via ORAL
  Filled 2019-04-03 (×13): qty 3

## 2019-04-03 MED ORDER — ONDANSETRON HCL 4 MG PO TABS
8.0000 mg | ORAL_TABLET | Freq: Three times a day (TID) | ORAL | Status: DC | PRN
  Administered 2019-04-05 – 2019-04-10 (×3): 8 mg via ORAL
  Filled 2019-04-03 (×4): qty 2

## 2019-04-03 MED ORDER — PANTOPRAZOLE SODIUM 40 MG PO TBEC
40.0000 mg | DELAYED_RELEASE_TABLET | Freq: Every day | ORAL | Status: DC
  Administered 2019-04-04 – 2019-04-16 (×13): 40 mg via ORAL
  Filled 2019-04-03 (×13): qty 1

## 2019-04-03 MED ORDER — IBUPROFEN 200 MG PO TABS
400.0000 mg | ORAL_TABLET | Freq: Three times a day (TID) | ORAL | Status: DC | PRN
  Administered 2019-04-03 – 2019-04-09 (×3): 400 mg via ORAL
  Filled 2019-04-03 (×3): qty 2

## 2019-04-03 MED ORDER — OXYBUTYNIN CHLORIDE ER 10 MG PO TB24
10.0000 mg | ORAL_TABLET | Freq: Every day | ORAL | Status: DC
  Administered 2019-04-04 – 2019-04-16 (×13): 10 mg via ORAL
  Filled 2019-04-03 (×13): qty 1

## 2019-04-03 MED ORDER — OXYCODONE HCL 5 MG PO TABS
10.0000 mg | ORAL_TABLET | Freq: Three times a day (TID) | ORAL | Status: DC | PRN
  Administered 2019-04-04 (×2): 10 mg via ORAL
  Filled 2019-04-03 (×2): qty 2

## 2019-04-03 MED ORDER — SORBITOL 70 % SOLN
30.0000 mL | Freq: Every day | Status: DC | PRN
  Administered 2019-04-03 – 2019-04-05 (×3): 30 mL via ORAL
  Filled 2019-04-03 (×3): qty 30

## 2019-04-03 MED ORDER — ENOXAPARIN SODIUM 40 MG/0.4ML ~~LOC~~ SOLN: 40.0000 mg | SUBCUTANEOUS | Status: DC

## 2019-04-03 MED ORDER — FAMOTIDINE 20 MG PO TABS
20.0000 mg | ORAL_TABLET | Freq: Every day | ORAL | Status: DC
  Administered 2019-04-03 – 2019-04-15 (×13): 20 mg via ORAL
  Filled 2019-04-03 (×13): qty 1

## 2019-04-03 MED ORDER — FUROSEMIDE 20 MG PO TABS
20.0000 mg | ORAL_TABLET | Freq: Once | ORAL | Status: AC
  Administered 2019-04-03: 20 mg via ORAL
  Filled 2019-04-03: qty 1

## 2019-04-03 MED ORDER — VITAMIN D 25 MCG (1000 UNIT) PO TABS
5000.0000 [IU] | ORAL_TABLET | Freq: Every day | ORAL | Status: DC
  Administered 2019-04-04 – 2019-04-16 (×13): 5000 [IU] via ORAL
  Filled 2019-04-03 (×17): qty 5

## 2019-04-03 MED ORDER — DALFAMPRIDINE ER 10 MG PO TB12
10.0000 mg | ORAL_TABLET | Freq: Two times a day (BID) | ORAL | Status: DC
  Administered 2019-04-03 – 2019-04-16 (×26): 10 mg via ORAL
  Filled 2019-04-03 (×26): qty 1

## 2019-04-03 MED ORDER — BACLOFEN 20 MG PO TABS
20.0000 mg | ORAL_TABLET | Freq: Three times a day (TID) | ORAL | Status: DC
  Administered 2019-04-03 – 2019-04-16 (×38): 20 mg via ORAL
  Filled 2019-04-03 (×39): qty 1

## 2019-04-03 MED ORDER — DULOXETINE HCL 30 MG PO CPEP
30.0000 mg | ORAL_CAPSULE | Freq: Two times a day (BID) | ORAL | Status: DC
  Administered 2019-04-03 – 2019-04-16 (×26): 30 mg via ORAL
  Filled 2019-04-03 (×26): qty 1

## 2019-04-03 NOTE — Progress Notes (Signed)
Sharon Ribas, MD  Physician  Physical Medicine and Rehabilitation  PMR Pre-admission  Signed  Date of Service:  04/03/2019 1:01 PM      Related encounter: ED to Hosp-Admission (Current) from 03/30/2019 in [place]         PMR Admission Coordinator Pre-Admission Assessment  Patient: Sharon Odom is an 48 y.o., female MRN: [id number] DOB: April 06, 1971 Height: '5\' 9"'$  (175.3 cm) Weight: 89 kg  Insurance Information HMO:     PPO: yes     PCP:      IPA:      80/20:      OTHER:  PRIMARY: Sharon Odom      Policy#: [id number]      Subscriber: Sharon Odom CM Name: Authorization provided via Automated Fax      Phone#: [phone]     Fax#: [phone] Pre-Cert#: Reference number [id number]    Employer:  Faxed approval from [facility] on 04/03/2019 for admit to [facility] on 04/03/2019. Pt is approved for 21 days (approved through 04/23/2019). Clinical updates are required on 04/23/2019 OR discharge on 04/24/2019. Discharge notification is required. Clinical updates are to be faxed to [phone]). Benefits:  Phone #: [phone]     Name:  Eff. Date: 05/28/2016 - still active     Deduct: $4,000 (deductible has been met)      Out of Pocket Max: $6,000 ($6,000 met)      Life Max: NA CIR: 100% coverage      SNF: 100% covered with a limit of 130 days/cal yr (68 days remaining), prior auth required Outpatient: PT/OT/ST limited to medical necessity (therapy limits only applies to cardiac rehab services)     Co-Pay: $72/visit Home Health: 100% covered      Co-Pay:  DME: 100% covered (no cap)     Co-Pay:  Providers:  SECONDARY: Medicare Part A       Policy#: [id number]      Subscriber: Patient CM Name:       Phone#:      Fax#:  Pre-Cert#:      Employer:  Benefits:  Phone #: verified online     Name: verified eligibility online via [place] on 04/03/2019 Eff. Date: Part A effective 06/28/2014      Deduct:  $1,408      Out of Pocket Max:       Life Max:  CIR:       SNF:  Outpatient:      Co-Pay:  Home Health:       Co-Pay:  DME:     Co-Pay:   Medicaid Application Date:       Case Manager:  Disability Application Date:       Case Worker:   The Data Collection Information Summary for patients in Inpatient Rehabilitation Facilities with attached Privacy Act [place] Records was provided and verbally reviewed with: Patient  Emergency Contact Information         Contact Information    Name Relation Home Work Mobile   Sharon Odom [facility]   [phone]      Current Medical History  Patient Admitting Diagnosis: exacerbation of MS symtoms  History of Present Illness: Sharon Odom is a 48 year old female with history of asthma with tobacco abuse, anxiety/depression as well as multiple sclerosisdiagnosed 2019fllowed outpatient by neurology services Dr. SFelecia Shellingmaintained on LKotzebuewell as dalfampridine. Patient with recent placement of baclofen pump 02/18/2019 per Dr. HMaryjean Ka  for increasing spasticity related to her multiple sclerosis.Presented 03/31/2019 with increasing lower extremity weakness, dysphagia, muscle spasms, blurred vision and worsening of chronic left hand weakness. She did recently see her ophthalmologist for blurred vision as report no acute findings noted. Admission chemistries unremarkable, WBC 11,000, SARS Covid negative. MRI of the brain, cervical and thoracic spine with and without contrast obtained revealed multiple chronic demyelinating lesions without acute enhancement. Neurology follow-up it was advised to begin IV Solu-Medrol x3 doses 03/31/2019 for suspected MS exacerbation. Subcutaneous Lovenox added for DVT prophylaxis. Speech therapy evaluation for dysphagia diet has steadily been advanced now on a regular consistency. Therapy evaluations completed and patient is to be admitted for a comprehensive rehab program on 04/03/2019.  Patient's  medical record from [facility] has been reviewed by the rehabilitation admission coordinator and physician.  Past Medical History      Past Medical History:  Diagnosis Date   Allergies    Anxiety    Arthritis    Asthma    Constipation    Depression    GERD (gastroesophageal reflux disease)    Headache    History of hiatal hernia    Multiple sclerosis (HCC)    Vision abnormalities    Wears glasses     Family History   family history includes Diabetes in her mother; Healthy in her brother.  Prior Rehab/Hospitalizations Has the patient had prior rehab or hospitalizations prior to admission? Yes  Has the patient had major surgery during 100 days prior to admission? Yes             Current Medications  Current Facility-Administered Medications:    acetaminophen (TYLENOL) tablet 1,000 mg, 1,000 mg, Oral, Q8H, Cristescu, Mircea G, MD, Stopped at 04/01/19 2244   albuterol (PROVENTIL) (2.5 MG/3ML) 0.083% nebulizer solution 2.5 mg, 2.5 mg, Nebulization, Q6H PRN, Cristescu, Mircea G, MD   baclofen (LIORESAL) tablet 20 mg, 20 mg, Oral, TID, Madilyn Hook A, PA-C, 20 mg at 04/03/19 1016   buPROPion (WELLBUTRIN XL) 24 hr tablet 300 mg, 300 mg, Oral, Daily, Cristescu, Mircea G, MD, 300 mg at 04/03/19 1015   cholecalciferol (VITAMIN D3) tablet 5,000 Units, 5,000 Units, Oral, Daily, Cristescu, Mircea G, MD, 5,000 Units at 04/03/19 1016   dalfampridine TB12 10 mg, 10 mg, Oral, BID, Dhungel, Nishant, MD, 10 mg at 04/03/19 1017   diazepam (VALIUM) tablet 5 mg, 5 mg, Oral, Q8H PRN, Cristescu, Mircea G, MD   DULoxetine (CYMBALTA) DR capsule 30 mg, 30 mg, Oral, BID, Cristescu, Mircea G, MD, 30 mg at 04/03/19 1017   enoxaparin (LOVENOX) injection 40 mg, 40 mg, Subcutaneous, Q24H, Cristescu, Mircea G, MD, 40 mg at 04/03/19 0615   famotidine (PEPCID) tablet 20 mg, 20 mg, Oral, QHS, Cristescu, Mircea G, MD, 20 mg at 04/02/19 2109   ibuprofen  (ADVIL) tablet 400 mg, 400 mg, Oral, Q8H PRN, Cristescu, Mircea G, MD, 400 mg at 04/03/19 1305   montelukast (SINGULAIR) tablet 10 mg, 10 mg, Oral, Daily, Cristescu, Mircea G, MD, 10 mg at 04/03/19 1016   ondansetron (ZOFRAN) tablet 8 mg, 8 mg, Oral, Q8H PRN, Cristescu, Mircea G, MD   oxybutynin (DITROPAN-XL) 24 hr tablet 10 mg, 10 mg, Oral, Daily, Cristescu, Mircea G, MD, 10 mg at 04/03/19 1017   oxyCODONE-acetaminophen (PERCOCET/ROXICET) 5-325 MG per tablet 1 tablet, 1 tablet, Oral, Q4H PRN, 1 tablet at 04/03/19 1305 **AND** oxyCODONE (Oxy IR/ROXICODONE) immediate release tablet 10 mg, 10 mg, Oral, Q4H PRN, Cristescu, Mircea G, MD, 10 mg  at 04/03/19 1305   pantoprazole (PROTONIX) EC tablet 40 mg, 40 mg, Oral, Daily, Dhungel, Nishant, MD, 40 mg at 04/03/19 1016   polyethylene glycol (MIRALAX / GLYCOLAX) packet 17 g, 17 g, Oral, Daily, Dhungel, Nishant, MD, 17 g at 04/03/19 1017   sodium chloride flush (NS) 0.9 % injection 3 mL, 3 mL, Intravenous, Once, Drenda Freeze, MD, Stopped at 03/30/19 2233   vitamin B-12 (CYANOCOBALAMIN) tablet 2,500 mcg, 2,500 mcg, Oral, Daily, Dhungel, Nishant, MD, 2,500 mcg at 04/03/19 1015  Patients Current Diet:     Diet Order                  Diet - low sodium heart healthy         DIET SOFT Room service appropriate? Yes; Fluid consistency: Thin  Diet effective now               Precautions / Restrictions Precautions Precautions: Fall Restrictions Weight Bearing Restrictions: No   Has the patient had 2 or more falls or a fall with injury in the past year? No  Prior Activity Level Limited Community (1-2x/wk): would get out as much as she needed too; limited mostly to MD appointments though because she has so many. did not work or drive PTA.   Prior Functional Level Self Care: Did the patient need help bathing, dressing, using the toilet or eating? Independent  Indoor Mobility: Did the patient need assistance with walking  from room to room (with or without device)? Independent  Stairs: Did the patient need assistance with internal or external stairs (with or without device)? Independent  Functional Cognition: Did the patient need help planning regular tasks such as shopping or remembering to take medications? Independent  Home Assistive Devices / Equipment Home Assistive Devices/Equipment: Wheelchair Home Equipment: Grab bars - tub/shower, Environmental consultant - 4 wheels, Shower seat  Prior Device Use: Indicate devices/aids used by the patient prior to current illness, exacerbation or injury? Manual wheelchair, Environmental consultant and (mostly used RW at home)  Current Functional Level Cognition  Overall Cognitive Status: Within Functional Limits for tasks assessed Orientation Level: Oriented X4 General Comments: Good awareness of medical history and current condition    Extremity Assessment (includes Sensation/Coordination)  Upper Extremity Assessment: RUE deficits/detail, LUE deficits/detail RUE Deficits / Details: grossly 4/5 throughout; able to use functionally, increased time for finger to thumb opposition; poor rapid alternating movement in BUE RUE Sensation: decreased light touch RUE Coordination: decreased fine motor, decreased gross motor LUE Deficits / Details: grossly 3-/5;pt demonstrates spasticity in L hand following grip strength test, hand spasticity digits into flexion; pt able to use RUE to extend digits and stretch LUE Sensation: decreased light touch, decreased proprioception LUE Coordination: decreased fine motor, decreased gross motor  Lower Extremity Assessment: RLE deficits/detail, LLE deficits/detail RLE Deficits / Details: Functionally 4/5 throughout RLE Sensation: decreased light touch, decreased proprioception RLE Coordination: decreased gross motor, decreased fine motor LLE Deficits / Details: Functionally <3/5 throughout LLE Sensation: decreased light touch, decreased proprioception LLE  Coordination: decreased fine motor, decreased gross motor    ADLs  Overall ADL's : Needs assistance/impaired Eating/Feeding: Modified independent Eating/Feeding Details (indicate cue type and reason): able to hold plastic utensitl and open bottle;reports she has adaptive equipment at home to cut food;husband also assists with meal prep Grooming: Minimal assistance, Sitting Grooming Details (indicate cue type and reason): decreased use of LUE Upper Body Bathing: Set up, Sitting Lower Body Bathing: Minimal assistance, Sit to/from stand Upper Body Dressing : Set  up, Sitting Lower Body Dressing: Minimal assistance, Sit to/from stand Toilet Transfer: Minimal assistance, Stand-pivot, RW Toilet Transfer Details (indicate cue type and reason): simulated from EOB to recliner Toileting- Clothing Manipulation and Hygiene: Minimal assistance, Sit to/from stand Functional mobility during ADLs: Minimal assistance, Rolling walker    Mobility  Overal bed mobility: Needs Assistance Bed Mobility: Supine to Sit Supine to sit: Min guard, Mod assist, HOB elevated General bed mobility comments: MIN/guard to initiate transfer with use of leg lifter , but needed MOD A at end of transfer to tuse bed pad to get hips fully turned    Transfers  Overall transfer level: Needs assistance Equipment used: Rolling walker (2 wheeled) Transfers: Sit to/from Stand Sit to Stand: Min assist, From elevated surface Stand pivot transfers: Min assist, +2 safety/equipment General transfer comment: Improved transfer today with no loss of balance and good use of UE    Ambulation / Gait / Stairs / Wheelchair Mobility  Ambulation/Gait Ambulation/Gait assistance: Herbalist (Feet): 3 Feet Assistive device: Rolling walker (2 wheeled) Gait Pattern/deviations: Step-through pattern, Decreased step length - left, Decreased dorsiflexion - left General Gait Details: AMb from bed to window with L foot drag and  then was able to pivot backwards to recliner    Posture / Balance Balance Overall balance assessment: Needs assistance Sitting-balance support: No upper extremity supported, Feet supported Sitting balance-Leahy Scale: Fair Standing balance support: Bilateral upper extremity supported Standing balance-Leahy Scale: Poor Standing balance comment: requires UE support    Special needs/care consideration BiPAP/CPAP : no CPM : no Continuous Drip IV : no Dialysis : no        Days : no Life Vest : no Oxygen : no Special Bed : no Trach Size : no Wound Vac (area) : no      Location : no Skin: no areas of concern           Bowel mgmt: last BM 03/26/2019, [facility] Bladder mgmt: continent Diabetic mgmt: : no Behavioral consideration : no Chemo/radiation : no *has baclofen pump in LLQ   Previous Home Environment (from acute therapy documentation) Living Arrangements: Spouse/significant other Available Help at Discharge: Family, Available PRN/intermittently Type of Home: House Home Layout: Two level, Able to live on main level with bedroom/bathroom(upstairs in gues living) Home Access: Stairs to enter Entrance Stairs-Rails: Can reach both Entrance Stairs-Number of Steps: 5 Bathroom Shower/Tub: Walk-in shower(6-inch step in) Biochemist, clinical: Handicapped height Bathroom Accessibility: Yes How Accessible: Accessible via walker Home Care Services: No Additional Comments: Husband works, pt reports having supportive neighbors who are home during day and can provide assist as needed. Plans to get home remodeled to include larger walk-in shower  Discharge Living Setting Plans for Discharge Living Setting: Patient's home, Lives with (comment)(husband) Type of Home at Discharge: House Discharge Home Layout: Two level Alternate Level Stairs-Rails: Left Alternate Level Stairs-Number of Steps: 16 Discharge Home Access: Stairs to enter Entrance Stairs-Rails: Can reach both Entrance  Stairs-Number of Steps: 4-5 Discharge Bathroom Shower/Tub: Walk-in shower Discharge Bathroom Toilet: Handicapped height Discharge Bathroom Accessibility: Yes How Accessible: Accessible via wheelchair, Accessible via walker Does the patient have any problems obtaining your medications?: No  Social/Family/Support Systems Patient Roles: Spouse Contact Information: husband: Shanon Brow: [phone] Anticipated Caregiver: husband vs neighbors Anticipated Caregiver's Contact Information: see above Ability/Limitations of Caregiver: Min A Caregiver Availability: Intermittent Discharge Plan Discussed with Primary Caregiver: Yes Is Caregiver In Agreement with Plan?: Yes Does Caregiver/Family have Issues with Lodging/Transportation while Pt is in Rehab?:  No  Goals/Additional Needs Patient/Family Goal for Rehab: PT/OT: Mod I/Supervision; SLP: NA Expected length of stay: 7-10 days Cultural Considerations: NA Dietary Needs: soft diet, thin liquids Equipment Needs: TBD Pt/Family Agrees to Admission and willing to participate: Yes Program Orientation Provided & Reviewed with Pt/Caregiver Including Roles  & Responsibilities: Yes(pt and husband)  Barriers to Discharge: Home environment access/layout, Lack of/limited family support, Insurance for SNF coverage  Barriers to Discharge Comments: steps to enter; exercise room on 2nd story; only intermittant assist from husband/neighbors during working hours  Decrease burden of Care through IP rehab admission: NA  Possible need for SNF placement upon discharge: No  Patient Condition: I have reviewed medical records from [facility], spoken with RN, MD, and patient and spouse. I met with patient at the bedside for inpatient rehabilitation assessment.  Patient will benefit from ongoing PT and OT, can actively participate in 3 hours of therapy a day 5 days of the week, and can make measurable gains during the admission.  Patient will also  benefit from the coordinated team approach during an Inpatient Acute Rehabilitation admission.  The patient will receive intensive therapy as well as Rehabilitation physician, nursing, social worker, and care management interventions.  Due to safety, skin/wound care, disease management, medication administration, pain management and patient education the patient requires 24 hour a day rehabilitation nursing.  The patient is currently Min to [place] with mobility and basic ADLs.  Discharge setting and therapy post discharge at home with home health is anticipated.  Patient has agreed to participate in the Acute Inpatient Rehabilitation Program and will be admitted on 11/6.  Preadmission Screen Completed By:  Raechel Ache, 04/03/2019 1:25 PM ______________________________________________________________________   Discussed status with Dr. Ranell Patrick on 04/03/2019 at 1:24PM and received approval for admission today.  Admission Coordinator:  Raechel Ache, OT, time 1:24PM/Date 04/03/2019   Assessment/Plan: Diagnosis: 1. Does the need for close, 24 hr/day Medical supervision in concert with the patient's rehab needs make it unreasonable for this patient to be served in a less intensive setting? Yes 2. Co-Morbidities requiring supervision/potential complications: MS, asthma, depression, spasticity, pain 3. Due to bowel management, safety, skin/wound care, disease management, pain management and patient education, does the patient require 24 hr/day rehab nursing? Yes 4. Does the patient require coordinated care of a physician, rehab nurse, PT, OT, and SLP to address physical and functional deficits in the context of the above medical diagnosis(es)? Yes Addressing deficits in the following areas: balance, endurance, locomotion, strength, transferring, bowel/bladder control, bathing, dressing, feeding, grooming, toileting, cognition, speech, language, swallowing and psychosocial support 5. Can the patient actively  participate in an intensive therapy program of at least 3 hrs of therapy 5 days a week? Yes 6. The potential for patient to make measurable gains while on inpatient rehab is excellent 7. Anticipated functional outcomes upon discharge from inpatient rehab: modified independent PT, modified independent OT, independent SLP 8. Estimated rehab length of stay to reach the above functional goals is: 2 weeks 9. Anticipated discharge destination: Inpatient Rehabilitation 10. Overall Rehab/Functional Prognosis: excellent   MD Signature: Leeroy Cha, MD        Revision History Date/Time User Provider Type Action  04/03/2019 2:24 PM Ranell Patrick, Clide Deutscher, MD Physician Sign  04/03/2019 1:28 PM Raechel Ache, OT Rehab Admission Coordinator Share  04/03/2019 1:25 PM Raechel Ache, OT Rehab Admission Coordinator Share  04/03/2019 1:03 PM Ranell Patrick, Clide Deutscher, MD Physician Share  04/02/2019 3:10 PM Raechel Ache, Cricket Rehab Admission Coordinator [place]  View Details Report

## 2019-04-03 NOTE — Progress Notes (Signed)
Inpatient Rehabilitation-Admissions Coordinator   I have received insurance approval and medical clearance from Dr. Maylene Roes for admit to CIR today. Pt aware and very excited to be going to CIR today. We reviewed insurance benefits letter and she signed consent forms. AC has notified RN and TOC team regarding plan for today.   Please call if questions.   Jhonnie Garner, OTR/L  Rehab Admissions Coordinator  [phone] 04/03/2019 3:39 PM

## 2019-04-03 NOTE — Consult Note (Signed)
Physical Medicine and Rehabilitation Consult Reason for Consult: Impaired mobility and ADLs secondary to MS exacerbation Referring Physician: Dr. Noralee Stain   HPI: Sharon Odom is a 48 year old right-handed female with history of asthma with tobacco abuse, anxiety/depression as well as multiple sclerosis diagnosed 2013 followed outpatient by neurology services Dr. Epimenio Foot maintained on [place] as well as dalfampridine.  Patient with recent placement of baclofen pump 02/18/2019 per Dr. Ollen Bowl for increasing spasticity related to her multiple sclerosis.  Per chart review patient lives with spouse.  Two-level home with bed and bath on main level and 5 steps to entry.  Independent with assistive device prior to admission.  Presented 03/31/2019 with increasing lower extremity weakness, dysphagia, muscle spasms, blurred vision and worsening of chronic left hand weakness.  She did recently see her ophthalmologist for blurred vision as report no acute findings noted.  Admission chemistries unremarkable, WBC 11,000, SARS Covid negative.  MRI of the brain, cervical and thoracic spine with and without contrast obtained revealed multiple chronic demyelinating lesions without acute enhancement.  Neurology follow-up it was advised to begin IV Solu-Medrol x3 doses 03/31/2019 for suspected MS exacerbation.  Subcutaneous Lovenox added for DVT prophylaxis.  Speech therapy evaluation for dysphagia diet has steadily been advanced now on a regular consistency.  Therapy evaluations completed and patient was admitted for a comprehensive rehab program.   ROS Past Medical History:  Diagnosis Date  . Allergies   . Anxiety   . Arthritis   . Asthma   . Constipation   . Depression   . GERD (gastroesophageal reflux disease)   . Headache   . History of hiatal hernia   . Multiple sclerosis (HCC)   . Vision abnormalities   . Wears glasses    Past Surgical History:  Procedure Laterality Date  . EXCISION MORTON'S  NEUROMA Right   . fallopian tube removal    . INTRATHECAL PUMP IMPLANT Right 02/18/2019   Procedure: INTRATHECAL PUMP IMPLANT;  Surgeon: Odette Fraction, MD;  Location: [facility] OR;  Service: Neurosurgery;  Laterality: Right;  INTRATHECAL PUMP IMPLANT  . WISDOM TOOTH EXTRACTION     Family History  Problem Relation Age of Onset  . Diabetes Mother   . Healthy Brother    Social History:  reports that she has been smoking cigarettes. She has been smoking about 0.50 packs per day. She has never used smokeless tobacco. She reports current alcohol use. She reports that she does not use drugs. Allergies:  Allergies  Allergen Reactions  . Morphine Other (See Comments)   Medications Prior to Admission  Medication Sig Dispense Refill  . acetaminophen (TYLENOL) 650 MG CR tablet Take 1,300 mg by mouth every 8 (eight) hours as needed for pain.     Marland Kitchen albuterol (PROVENTIL) (2.5 MG/3ML) 0.083% nebulizer solution Take 2.5 mg by nebulization every 6 (six) hours as needed for wheezing or shortness of breath.    Marland Kitchen albuterol (VENTOLIN HFA) 108 (90 Base) MCG/ACT inhaler Inhale 1-2 puffs into the lungs every 6 (six) hours as needed for wheezing or shortness of breath.     . Alemtuzumab (LEMTRADA) 12 MG/1.2ML SOLN Inject 12 mg into the vein See admin instructions. Once a year    . baclofen (LIORESAL) 20 MG tablet 1 PO Q6 hours PRN for breakthrough spasms (Patient taking differently: Take 20 mg by mouth 4 (four) times daily. ) 120 each 3  . botulinum toxin Type A (BOTOX) 100 units SOLR injection Inject 600 Units into the muscle  every 3 (three) months.    Marland Kitchen. buPROPion (WELLBUTRIN XL) 300 MG 24 hr tablet Take 300 mg by mouth daily.     . Cholecalciferol (VITAMIN D-3) 5000 units TABS Take 5,000 Units by mouth daily.     . Cyanocobalamin (VITAMIN B-12) 2500 MCG SUBL Place 2,500 mcg under the tongue daily.     Marland Kitchen. dalfampridine 10 MG TB12 One po bid (Patient taking differently: Take 10 mg by mouth 2 (two) times daily. One po bid)  60 tablet 11  . diazepam (VALIUM) 5 MG tablet Take up to 3 pills a day for spasticity (Patient taking differently: Take 5-10 mg by mouth See admin instructions. Take up to 3 pills a day for spasticity) 90 tablet 3  . DULoxetine (CYMBALTA) 30 MG capsule Take 1 capsule (30 mg total) by mouth 2 (two) times daily. 60 capsule 3  . ibuprofen (ADVIL,MOTRIN) 200 MG tablet Take 400 mg by mouth every 8 (eight) hours as needed for moderate pain.     . montelukast (SINGULAIR) 10 MG tablet Take 10 mg by mouth daily.     . ondansetron (ZOFRAN) 8 MG tablet as needed With infusions (Patient taking differently: Take 8 mg by mouth every 8 (eight) hours as needed for nausea or vomiting. as needed With infusions) 20 tablet 1  . oxybutynin (DITROPAN-XL) 10 MG 24 hr tablet Take 10 mg by mouth daily.    Marland Kitchen. oxyCODONE-acetaminophen (PERCOCET) 10-325 MG tablet Take 1 tablet by mouth 2 (two) times daily as needed for pain. 60 tablet 0  . ranitidine (ZANTAC) 300 MG tablet Take 300 mg by mouth daily as needed for heartburn.    . cyclobenzaprine (FLEXERIL) 10 MG tablet Take 1 tablet (10 mg total) by mouth 3 (three) times daily. (Patient not taking: Reported on 03/31/2019) 90 tablet 5    Home: Home Living Family/patient expects to be discharged to:: Private residence Living Arrangements: Spouse/significant other Available Help at Discharge: Family, Available PRN/intermittently Type of Home: House Home Access: Stairs to enter Secretary/administratorntrance Stairs-Number of Steps: 5 Entrance Stairs-Rails: Can reach both Home Layout: Two level, Able to live on main level with bedroom/bathroom(upstairs in gues living) Bathroom Shower/Tub: Walk-in shower(6-inch step in) Bathroom Toilet: Handicapped height Bathroom Accessibility: Yes Home Equipment: Grab bars - tub/shower, Environmental consultantWalker - 4 wheels, Shower seat Additional Comments: Husband works, pt reports having supportive neighbors who are home during day and can provide assist as needed. Plans to get  home remodeled to include larger walk-in shower  Functional History: Prior Function Level of Independence: Independent with assistive device(s) Comments: Prior to exacerbation, pt mod indep with rollator. Since last weekend, reports "crab walking" requiring assist from husband for standing and transfers due to weakness and other symptoms Functional Status:  Mobility: Bed Mobility Overal bed mobility: Needs Assistance Bed Mobility: Supine to Sit Supine to sit: Min guard, Mod assist, HOB elevated General bed mobility comments: MIN/guard to initiate transfer with use of leg lifter , but needed MOD A at end of transfer to tuse bed pad to get hips fully turned Transfers Overall transfer level: Needs assistance Equipment used: Rolling walker (2 wheeled) Transfers: Sit to/from Stand Sit to Stand: Min assist, From elevated surface Stand pivot transfers: Min assist, +2 safety/equipment General transfer comment: Improved transfer today with no loss of balance and good use of UE Ambulation/Gait Ambulation/Gait assistance: Min assist Gait Distance (Feet): 3 Feet Assistive device: Rolling walker (2 wheeled) Gait Pattern/deviations: Step-through pattern, Decreased step length - left, Decreased dorsiflexion - left  General Gait Details: AMb from bed to window with L foot drag and then was able to pivot backwards to recliner    ADL: ADL Overall ADL's : Needs assistance/impaired Eating/Feeding: Modified independent Eating/Feeding Details (indicate cue type and reason): able to hold plastic utensitl and open bottle;reports she has adaptive equipment at home to cut food;husband also assists with meal prep Grooming: Minimal assistance, Sitting Grooming Details (indicate cue type and reason): decreased use of LUE Upper Body Bathing: Set up, Sitting Lower Body Bathing: Minimal assistance, Sit to/from stand Upper Body Dressing : Set up, Sitting Lower Body Dressing: Minimal assistance, Sit to/from stand  Toilet Transfer: Minimal assistance, Stand-pivot, RW Toilet Transfer Details (indicate cue type and reason): simulated from EOB to recliner Toileting- Clothing Manipulation and Hygiene: Minimal assistance, Sit to/from stand Functional mobility during ADLs: Minimal assistance, Rolling walker  Cognition: Cognition Overall Cognitive Status: Within Functional Limits for tasks assessed Orientation Level: Oriented X4 Cognition Arousal/Alertness: Awake/alert Behavior During Therapy: WFL for tasks assessed/performed Overall Cognitive Status: Within Functional Limits for tasks assessed General Comments: Good awareness of medical history and current condition  Blood pressure 130/78, pulse 60, temperature 98.4 F (36.9 C), temperature source Oral, resp. rate 16, height 5\' 9"  (1.753 m), weight 89 kg, SpO2 97 %.   Physical Exam  General: Alert and oriented x 3, No apparent distress HEENT: Head is normocephalic, atraumatic, PERRLA, EOMI, sclera anicteric, oral mucosa pink and moist, dentition intact, ext ear canals clear,  Neck: Supple without JVD or lymphadenopathy Heart: Reg rate and rhythm. No murmurs rubs or gallops Chest: CTA bilaterally without wheezes, rales, or rhonchi; no distress Abdomen: Soft, non-tender, non-distended, bowel sounds positive. Extremities: No clubbing, cyanosis, or edema. Pulses are 2+ Skin: Clean and intact without signs of breakdown. LLE edematous.  Neuro: Pt is cognitively appropriate with normal insight, memory, and awareness. Makes good eye contact with examiner follows full commands.  Fair awareness of her deficits.  Cranial nerves 2-12 are intact. Sensory exam is normal. Fine motor coordination is intact. No tremors.  5/5 strength in upper extremities. 4-/5 throughout RLE. Unable to move LLE at this time, but patient states her edema is worse than usual.   Musculoskeletal: Full ROM, No pain with AROM or PROM in the neck, trunk, or extremities. Posture appropriate  Psych: Pt's affect is appropriate. Pt is cooperative  Assessment and Plan  Sharon Odom is a 48 year old right-handed female with history of asthma with tobacco abuse, anxiety/depression as well as multiple sclerosis diagnosed 2013 followed outpatient by neurology services Dr. Felecia Shelling maintained on [employer] as well as dalfampridine. She was admitted for MS exacerbation, treated with Solumedrol with improvement, but remains more impaired than baseline in mobility and ADLs. --Mrs. Klingel would be an excellent CIR candidate and she would be able to tolerate 3 hours of daily therapy. Currently pending insurance authorization. --She usually receives Lasix 20mg  while taking Solumedrol, and would benefit from dose today given her LLE edema. --She notes her spasticity to be worse today and may benefit from an increase in her oral Baclofen dose or consultation with her ITB pump provider for an increase in her intrathecal baclofen dose.  -Sleeping well, denies constipation.  --We will continue to follow in her care.   Vicente Masson, MD 04/03/2019

## 2019-04-03 NOTE — Progress Notes (Signed)
  PROGRESS NOTE  Patient seen. Doing well, states she is feeling much better than on admission. Awaiting CIR insurance auth. Medically ready for DC from my standpoint.    Dessa Phi, DO [facility] 04/03/2019, 9:10 AM  Available via Epic secure chat 7am-7pm After these hours, please refer to coverage provider listed on [email or web address]

## 2019-04-03 NOTE — Progress Notes (Signed)
Sharon Odom, Sharon P, MD  Physician  Physical Medicine and Rehabilitation  Consult Note  Signed  Date of Service:  04/02/2019 11:36 AM      Related encounter: ED to [facility]      Signed              Physical Medicine and Rehabilitation Consult Reason for Consult: Impaired mobility and ADLs secondary to MS exacerbation Referring Physician: Dr. Noralee StainJennifer Odom   HPI: Sharon BrooksChristy Odom is a 48 year old right-handed female with history of asthma with tobacco abuse, anxiety/depression as well as multiple sclerosisdiagnosed [id number] outpatient by neurology services Dr. Epimenio FootSater maintained on [place] well as dalfampridine. Patient with recent placement of baclofen pump 02/18/2019 per Dr. Ollen BowlHarkins for increasing spasticity related to her multiple sclerosis. Per chart review patient lives with spouse. Two-level home with bed and bath on main level and 5 steps to entry. Independent with assistive device prior to admission. Presented 03/31/2019 with increasing lower extremity weakness, dysphagia, muscle spasms, blurred vision and worsening of chronic left hand weakness. She did recently see her ophthalmologist for blurred vision as report no acute findings noted. Admission chemistries unremarkable, WBC 11,000, SARS Covid negative. MRI of the brain, cervical and thoracic spine with and without contrast obtained revealed multiple chronic demyelinating lesions without acute enhancement. Neurology follow-up it was advised to begin IV Solu-Medrol x3 doses 03/31/2019 for suspected MS exacerbation. Subcutaneous Lovenox added for DVT prophylaxis. Speech therapy evaluation for dysphagia diet has steadily been advanced now on a regular consistency. Therapy evaluations completed and patient was admitted for a comprehensive rehab program.   ROS     Past Medical History:  Diagnosis Date  . Allergies   . Anxiety   . Arthritis    . Asthma   . Constipation   . Depression   . GERD (gastroesophageal reflux disease)   . Headache   . History of hiatal hernia   . Multiple sclerosis (HCC)   . Vision abnormalities   . Wears glasses         Past Surgical History:  Procedure Laterality Date  . EXCISION MORTON'S NEUROMA Right   . fallopian tube removal    . INTRATHECAL PUMP IMPLANT Right 02/18/2019   Procedure: INTRATHECAL PUMP IMPLANT;  Surgeon: Sharon FractionHarkins, Paul, MD;  [facility];  Service: Neurosurgery;  Laterality: Right;  INTRATHECAL PUMP IMPLANT  . WISDOM TOOTH EXTRACTION          Family History  Problem Relation Age of Onset  . Diabetes Mother   . Healthy Brother    Social History:  reports that she has been smoking cigarettes. She has been smoking about 0.50 packs per day. She has never used smokeless tobacco. She reports current alcohol use. She reports that she does not use drugs. Allergies:      Allergies  Allergen Reactions  . Morphine Other (See Comments)         Medications Prior to Admission  Medication Sig Dispense Refill  . acetaminophen (TYLENOL) 650 MG CR tablet Take 1,300 mg by mouth every 8 (eight) hours as needed for pain.     Marland Kitchen. albuterol (PROVENTIL) (2.5 MG/3ML) 0.083% nebulizer solution Take 2.5 mg by nebulization every 6 (six) hours as needed for wheezing or shortness of breath.    Marland Kitchen. albuterol (VENTOLIN HFA) 108 (90 Base) MCG/ACT inhaler Inhale 1-2 puffs into the lungs every 6 (six) hours as needed for wheezing or shortness of breath.     .Marland Kitchen  Alemtuzumab (LEMTRADA) 12 MG/1.2ML SOLN Inject 12 mg into the vein See admin instructions. Once a year    . baclofen (LIORESAL) 20 MG tablet 1 PO Q6 hours PRN for breakthrough spasms (Patient taking differently: Take 20 mg by mouth 4 (four) times daily. ) 120 each 3  . botulinum toxin Type A (BOTOX) 100 units SOLR injection Inject 600 Units into the muscle every 3 (three) months.    Marland Kitchen buPROPion (WELLBUTRIN XL) 300 MG  24 hr tablet Take 300 mg by mouth daily.     . Cholecalciferol (VITAMIN D-3) 5000 units TABS Take 5,000 Units by mouth daily.     . Cyanocobalamin (VITAMIN B-12) 2500 MCG SUBL Place 2,500 mcg under the tongue daily.     Marland Kitchen dalfampridine 10 MG TB12 One po bid (Patient taking differently: Take 10 mg by mouth 2 (two) times daily. One po bid) 60 tablet 11  . diazepam (VALIUM) 5 MG tablet Take up to 3 pills a day for spasticity (Patient taking differently: Take 5-10 mg by mouth See admin instructions. Take up to 3 pills a day for spasticity) 90 tablet 3  . DULoxetine (CYMBALTA) 30 MG capsule Take 1 capsule (30 mg total) by mouth 2 (two) times daily. 60 capsule 3  . ibuprofen (ADVIL,MOTRIN) 200 MG tablet Take 400 mg by mouth every 8 (eight) hours as needed for moderate pain.     . montelukast (SINGULAIR) 10 MG tablet Take 10 mg by mouth daily.     . ondansetron (ZOFRAN) 8 MG tablet as needed With infusions (Patient taking differently: Take 8 mg by mouth every 8 (eight) hours as needed for nausea or vomiting. as needed With infusions) 20 tablet 1  . oxybutynin (DITROPAN-XL) 10 MG 24 hr tablet Take 10 mg by mouth daily.    Marland Kitchen oxyCODONE-acetaminophen (PERCOCET) 10-325 MG tablet Take 1 tablet by mouth 2 (two) times daily as needed for pain. 60 tablet 0  . ranitidine (ZANTAC) 300 MG tablet Take 300 mg by mouth daily as needed for heartburn.    . cyclobenzaprine (FLEXERIL) 10 MG tablet Take 1 tablet (10 mg total) by mouth 3 (three) times daily. (Patient not taking: Reported on 03/31/2019) 90 tablet 5    Home: Home Living Family/patient expects to be discharged to:: Private residence Living Arrangements: Spouse/significant other Available Help at Discharge: Family, Available PRN/intermittently Type of Home: House Home Access: Stairs to enter Secretary/administrator of Steps: 5 Entrance Stairs-Rails: Can reach both Home Layout: Two level, Able to live on main level with  bedroom/bathroom(upstairs in gues living) Bathroom Shower/Tub: Walk-in shower(6-inch step in) Bathroom Toilet: Handicapped height Bathroom Accessibility: Yes Home Equipment: Grab bars - tub/shower, Environmental consultant - 4 wheels, Shower seat Additional Comments: Husband works, pt reports having supportive neighbors who are home during day and can provide assist as needed. Plans to get home remodeled to include larger walk-in shower  Functional History: Prior Function Level of Independence: Independent with assistive device(s) Comments: Prior to exacerbation, pt mod indep with rollator. Since last weekend, reports "crab walking" requiring assist from husband for standing and transfers due to weakness and other symptoms Functional Status:  Mobility: Bed Mobility Overal bed mobility: Needs Assistance Bed Mobility: Supine to Sit Supine to sit: Min guard, Mod assist, HOB elevated General bed mobility comments: MIN/guard to initiate transfer with use of leg lifter , but needed MOD A at end of transfer to tuse bed pad to get hips fully turned Transfers Overall transfer level: Needs assistance Equipment used: Rolling  walker (2 wheeled) Transfers: Sit to/from Stand Sit to Stand: Min assist, From elevated surface Stand pivot transfers: Min assist, +2 safety/equipment General transfer comment: Improved transfer today with no loss of balance and good use of UE Ambulation/Gait Ambulation/Gait assistance: Min assist Gait Distance (Feet): 3 Feet Assistive device: Rolling walker (2 wheeled) Gait Pattern/deviations: Step-through pattern, Decreased step length - left, Decreased dorsiflexion - left General Gait Details: AMb from bed to window with L Odom drag and then was able to pivot backwards to recliner  ADL: ADL Overall ADL's : Needs assistance/impaired Eating/Feeding: Modified independent Eating/Feeding Details (indicate cue type and reason): able to hold plastic utensitl and open bottle;reports she has  adaptive equipment at home to cut food;husband also assists with meal prep Grooming: Minimal assistance, Sitting Grooming Details (indicate cue type and reason): decreased use of LUE Upper Body Bathing: Set up, Sitting Lower Body Bathing: Minimal assistance, Sit to/from stand Upper Body Dressing : Set up, Sitting Lower Body Dressing: Minimal assistance, Sit to/from stand Toilet Transfer: Minimal assistance, Stand-pivot, RW Toilet Transfer Details (indicate cue type and reason): simulated from EOB to recliner Toileting- Clothing Manipulation and Hygiene: Minimal assistance, Sit to/from stand Functional mobility during ADLs: Minimal assistance, Rolling walker  Cognition: Cognition Overall Cognitive Status: Within Functional Limits for tasks assessed Orientation Level: Oriented X4 Cognition Arousal/Alertness: Awake/alert Behavior During Therapy: WFL for tasks assessed/performed Overall Cognitive Status: Within Functional Limits for tasks assessed General Comments: Good awareness of medical history and current condition  Blood pressure 130/78, pulse 60, temperature 98.4 F (36.9 C), temperature source Oral, resp. rate 16, height 5\' 9"  (1.753 m), weight 89 kg, SpO2 97 %.   Physical Exam General: Alert and oriented x 3, No apparent distress HEENT: Head is normocephalic, atraumatic, PERRLA, EOMI, sclera anicteric, oral mucosa pink and moist, dentition intact, ext ear canals clear,  Neck: Supple without JVD or lymphadenopathy Heart: Reg rate and rhythm. No murmurs rubs or gallops Chest: CTA bilaterally without wheezes, rales, or rhonchi; no distress Abdomen: Soft, non-tender, non-distended, bowel sounds positive. Extremities: No clubbing, cyanosis, or edema. Pulses are 2+ Skin: Clean and intact without signs of breakdown. LLE edematous.  Neuro: Pt is cognitively appropriate with normal insight, memory, and awareness. Makes good eye contact with examiner follows full commands. Fair  awareness of her deficits. Cranial nerves 2-12 are intact. Sensory exam is normal. Fine motor coordination is intact. No tremors.  5/5 strength in upper extremities. 4-/5 throughout RLE. Unable to move LLE at this time, but patient states her edema is worse than usual.   Musculoskeletal: Full ROM, No pain with AROM or PROM in the neck, trunk, or extremities. Posture appropriate Psych: Pt's affect is appropriate. Pt is cooperative  Assessment and Plan  Sharon Odom is a 48 year old right-handed female with history of asthma with tobacco abuse, anxiety/depression as well as multiple sclerosisdiagnosed 2092followed outpatient by neurology services Dr. Felecia Shelling maintained on [place] well as dalfampridine. She was admitted for MS exacerbation, treated with Solumedrol with improvement, but remains more impaired than baseline in mobility and ADLs. --Mrs. Guadarrama would be an excellent CIR candidate and she would be able to tolerate 3 hours of daily therapy. Currently pending insurance authorization. --She usually receives Lasix 20mg  while taking Solumedrol, and would benefit from dose today given her LLE edema. --She notes her spasticity to be worse today and may benefit from an increase in her oral Baclofen dose or consultation with her ITB pump provider for an increase in her intrathecal baclofen dose.  -  Sleeping well, denies constipation.  --We will continue to follow in her care.   Godfrey Pick, MD 04/03/2019        Routing History Date/Time From To Method  04/03/2019 11:49 AM Raulkar, Drema Pry, MD Annita Brod, MD Fax

## 2019-04-03 NOTE — Progress Notes (Signed)
Patient arrived to unit, no s/s of distress noted. No complications noted at this time.  Davell Beckstead D Saory Carriero, LPN 

## 2019-04-03 NOTE — H&P (Signed)
Physical Medicine and Rehabilitation Admission H&P    No chief complaint on file. : HPI: Sharon Odom is a 48 year old right-handed female with history of asthma with tobacco abuse, anxiety/depression as well as multiple sclerosis diagnosed 2013 followed outpatient by neurology services Dr. Epimenio Foot maintained on [place] as well as dalfampridine.  Patient with recent placement of baclofen pump 02/18/2019 per Dr. Ollen Bowl for increasing spasticity related to her multiple sclerosis.  Per chart review patient lives with spouse.  Two-level home with bed and bath on main level and 5 steps to entry.  Independent with assistive device prior to admission.  Presented 03/31/2019 with increasing lower extremity weakness, dysphagia, muscle spasms, blurred vision and worsening of chronic left hand weakness.  She did recently see her ophthalmologist for blurred vision as report no acute findings noted.  Admission chemistries unremarkable, WBC 11,000, SARS Covid negative.  MRI of the brain, cervical and thoracic spine with and without contrast obtained revealed multiple chronic demyelinating lesions without acute enhancement.  Neurology follow-up it was advised to begin IV Solu-Medrol x3 doses 03/31/2019 for suspected MS exacerbation.  Subcutaneous Lovenox added for DVT prophylaxis.  Speech therapy evaluation for dysphagia diet has steadily been advanced now on a regular consistency.  Therapy evaluations completed and patient was admitted for a comprehensive rehab program.  Review of Systems  Constitutional: Negative for chills and fever.  HENT: Negative for hearing loss.   Eyes: Positive for blurred vision.  Respiratory: Negative for cough.        Some increasing shortness of breath with heavy exertion  Cardiovascular: Positive for leg swelling. Negative for chest pain and palpitations.  Gastrointestinal: Positive for constipation. Negative for heartburn and nausea.       GERD  Genitourinary: Positive for urgency.  Negative for dysuria, flank pain and hematuria.  Musculoskeletal: Positive for joint pain and myalgias.  Skin: Negative for rash.  Neurological: Positive for weakness and headaches.  Psychiatric/Behavioral: Positive for depression. The patient has insomnia.        Anxiety  All other systems reviewed and are negative.  Past Medical History:  Diagnosis Date  . Allergies   . Anxiety   . Arthritis   . Asthma   . Constipation   . Depression   . GERD (gastroesophageal reflux disease)   . Headache   . History of hiatal hernia   . Multiple sclerosis (HCC)   . Vision abnormalities   . Wears glasses    Past Surgical History:  Procedure Laterality Date  . EXCISION MORTON'S NEUROMA Right   . fallopian tube removal    . INTRATHECAL PUMP IMPLANT Right 02/18/2019   Procedure: INTRATHECAL PUMP IMPLANT;  Surgeon: Odette Fraction, MD;  Location: [facility] OR;  Service: Neurosurgery;  Laterality: Right;  INTRATHECAL PUMP IMPLANT  . WISDOM TOOTH EXTRACTION     Family History  Problem Relation Age of Onset  . Diabetes Mother   . Healthy Brother    Social History:  reports that she has been smoking cigarettes. She has been smoking about 0.50 packs per day. She has never used smokeless tobacco. She reports current alcohol use. She reports that she does not use drugs. Allergies:  Allergies  Allergen Reactions  . Morphine Other (See Comments)   Medications Prior to Admission  Medication Sig Dispense Refill  . acetaminophen (TYLENOL) 650 MG CR tablet Take 1,300 mg by mouth every 8 (eight) hours as needed for pain.     Marland Kitchen albuterol (PROVENTIL) (2.5 MG/3ML) 0.083% nebulizer solution Take  2.5 mg by nebulization every 6 (six) hours as needed for wheezing or shortness of breath.    Marland Kitchen albuterol (VENTOLIN HFA) 108 (90 Base) MCG/ACT inhaler Inhale 1-2 puffs into the lungs every 6 (six) hours as needed for wheezing or shortness of breath.     . Alemtuzumab (LEMTRADA) 12 MG/1.2ML SOLN Inject 12 mg into the vein See  admin instructions. Once a year    . baclofen (LIORESAL) 20 MG tablet 1 PO Q6 hours PRN for breakthrough spasms (Patient taking differently: Take 20 mg by mouth 4 (four) times daily. ) 120 each 3  . buPROPion (WELLBUTRIN XL) 300 MG 24 hr tablet Take 300 mg by mouth daily.     . Cholecalciferol (VITAMIN D-3) 5000 units TABS Take 5,000 Units by mouth daily.     . Cyanocobalamin (VITAMIN B-12) 2500 MCG SUBL Place 2,500 mcg under the tongue daily.     Marland Kitchen dalfampridine 10 MG TB12 One po bid (Patient taking differently: Take 10 mg by mouth 2 (two) times daily. One po bid) 60 tablet 11  . diazepam (VALIUM) 5 MG tablet Take up to 3 pills a day for spasticity (Patient taking differently: Take 5-10 mg by mouth See admin instructions. Take up to 3 pills a day for spasticity) 90 tablet 3  . DULoxetine (CYMBALTA) 30 MG capsule Take 1 capsule (30 mg total) by mouth 2 (two) times daily. 60 capsule 3  . ibuprofen (ADVIL,MOTRIN) 200 MG tablet Take 400 mg by mouth every 8 (eight) hours as needed for moderate pain.     . montelukast (SINGULAIR) 10 MG tablet Take 10 mg by mouth daily.     . ondansetron (ZOFRAN) 8 MG tablet as needed With infusions (Patient taking differently: Take 8 mg by mouth every 8 (eight) hours as needed for nausea or vomiting. as needed With infusions) 20 tablet 1  . oxybutynin (DITROPAN-XL) 10 MG 24 hr tablet Take 10 mg by mouth daily.    Marland Kitchen oxyCODONE-acetaminophen (PERCOCET) 10-325 MG tablet Take 1 tablet by mouth 2 (two) times daily as needed for pain. 60 tablet 0  . ranitidine (ZANTAC) 300 MG tablet Take 300 mg by mouth daily as needed for heartburn.      Drug Regimen Review Drug regimen was reviewed and remains appropriate with no significant issues identified  Home: Home Living Family/patient expects to be discharged to:: Private residence Living Arrangements: Spouse/significant other   Functional History:    Functional Status:  Mobility:          ADL:    Cognition:  Cognition Orientation Level: Oriented X4    Physical Exam: Blood pressure 128/70, pulse 77, temperature 99 F (37.2 C), temperature source Oral, resp. rate 18, height 5\' 9"  (1.753 m), weight 92.3 kg, SpO2 97 %. General: Alert and oriented x 3, No apparent distress HEENT: Head is normocephalic, atraumatic, PERRLA, EOMI, sclera anicteric, oral mucosa pink and moist, dentition intact, ext ear canals clear,  Neck: Supple without JVD or lymphadenopathy Heart: Reg rate and rhythm. No murmurs rubs or gallops Chest: CTA bilaterally without wheezes, rales, or rhonchi; no distress Abdomen: Soft, non-tender, non-distended, bowel sounds positive. Extremities: No clubbing, cyanosis, or edema. Pulses are 2+ Skin: Clean and intact without signs of breakdown. LLE edematous.  Neuro: Pt is cognitively appropriate with normal insight, memory, and awareness. Makes good eye contact with examiner follows full commands. Fair awareness of her deficits. Cranial nerves 2-12 are intact. Sensory exam is normal. Fine motor coordination is intact. No tremors.  5/5 strength in upper extremities. 4-/5 throughout RLE. Unable to move LLE at this time, but patient states her edema is worse than usual.   Musculoskeletal: Full ROM, No pain with AROM or PROM in the neck, trunk, or extremities. Posture appropriate Psych: Pt's affect is appropriate. Pt is cooperative  Results for orders placed or performed during the hospital encounter of 03/30/19 (from the past 48 hour(s))  Comprehensive metabolic panel     Status: Abnormal   Collection Time: 04/02/19  2:58 AM  Result Value Ref Range   Sodium 141 135 - 145 mmol/L   Potassium 4.1 3.5 - 5.1 mmol/L   Chloride 111 98 - 111 mmol/L   CO2 21 (L) 22 - 32 mmol/L   Glucose, Bld 154 (H) 70 - 99 mg/dL   BUN 17 6 - 20 mg/dL   Creatinine, Ser 1.610.79 0.44 - 1.00 mg/dL   Calcium 9.2 8.9 - 09.610.3 mg/dL   Total Protein 5.9 (L) 6.5 - 8.1 g/dL   Albumin 3.2 (L) 3.5 - 5.0 g/dL   AST 11 (L)  15 - 41 U/L   ALT 16 0 - 44 U/L   Alkaline Phosphatase 68 38 - 126 U/L   Total Bilirubin 0.2 (L) 0.3 - 1.2 mg/dL   GFR calc non Af Amer >60 >60 mL/min   GFR calc Af Amer >60 >60 mL/min   Anion gap 9 5 - 15    Comment: Performed at [facility], [address] [place], [place] [address]  CBC     Status: Abnormal   Collection Time: 04/02/19  2:58 AM  Result Value Ref Range   WBC 15.5 (H) 4.0 - 10.5 K/uL   RBC 4.17 3.87 - 5.11 MIL/uL   Hemoglobin 13.7 12.0 - 15.0 g/dL   HCT 98.141.4 19.136.0 - 47.846.0 %   MCV 99.3 80.0 - 100.0 fL   MCH 32.9 26.0 - 34.0 pg   MCHC 33.1 30.0 - 36.0 g/dL   RDW 29.512.5 62.111.5 - 30.815.5 %   Platelets 256 150 - 400 K/uL   nRBC 0.0 0.0 - 0.2 %    Comment: Performed at [facility], [address] [place], [place] [address]   No results found.  Medical Problem List and Plan: 1.  Decreased functional ability with increasing muscle weakness spasms and blurred vision secondary to MS exacerbation.  3 days IV Solu-Medrol completed.  Followed by Dr. Epimenio FootSater neurology services outpatient maintained on [place] as welll as dalfampridine  -CIR therapies including PT and OT 3 hours per day at least 5 days per week.  2.  Antithrombotics: -DVT/anticoagulation: Lovenox.  Check vascular study  -antiplatelet therapy: N/A 3. Pain Management: Recent insertion of intrathecal baclofen pump 02/18/2019 for spasticity, baclofen 20 mg 3 times daily, Cymbalta 30 mg twice daily, Valium 5 mg every 8 hours as needed, Advil as needed as well as oxycodone 4. Mood: Wellbutrin 300 mg daily, Cymbalta 20mg  BID  -antipsychotic agents: N/A 5. Neuropsych: This patient is capable of making decisions on her own behalf. 6. Skin/Wound Care: Routine skin checks 7. Fluids/Electrolytes/Nutrition: Routine in and outs with follow-up chemistries 8.  Asthma with history of tobacco abuse.  Continue nebulizers as needed as well as scheduled Singulair. 9.  GERD.  Pepcid Disposition: Upon discharge from acute  rehabilitation, Sharon Odom should have outpatient follow-up with PCP, physiatry, neurology (Dr. Epimenio FootSater), and neurosurgery (Dr. Ollen BowlHarkins).   Godfrey PickKrutika P Paulkar, MD 04/03/2019   I have personally performed a face to  face diagnostic evaluation, including, but not limited to relevant history and physical exam findings, of this patient and developed relevant assessment and plan.  Additionally, I have reviewed and concur with the physician assistant's documentation above.  The patient's status has not changed. The original post admission physician evaluation remains appropriate, and any changes from the pre-admission screening or documentation from the acute chart are noted above.   Leeroy Cha, MD

## 2019-04-04 ENCOUNTER — Inpatient Hospital Stay (HOSPITAL_COMMUNITY): Payer: BC Managed Care – PPO

## 2019-04-04 ENCOUNTER — Inpatient Hospital Stay (HOSPITAL_COMMUNITY): Payer: BC Managed Care – PPO | Admitting: Physical Therapy

## 2019-04-04 DIAGNOSIS — G35 Multiple sclerosis: Principal | ICD-10-CM

## 2019-04-04 DIAGNOSIS — M7989 Other specified soft tissue disorders: Secondary | ICD-10-CM

## 2019-04-04 MED ORDER — OXYCODONE HCL 5 MG PO TABS
10.0000 mg | ORAL_TABLET | ORAL | Status: DC | PRN
  Administered 2019-04-04 – 2019-04-16 (×44): 10 mg via ORAL
  Filled 2019-04-04 (×46): qty 2

## 2019-04-04 NOTE — Progress Notes (Signed)
[place] PHYSICAL MEDICINE & REHABILITATION PROGRESS NOTE   Subjective/Complaints:  Sharon Odom states exacerbation consisted of increased R sided weakness which has resolved after solumedrol , back to baseline per Sharon Odom  ROS- neg breathing isses, feels like legs are swollen L>R  Objective:   No results found. Recent Labs    04/02/19 0258  WBC 15.5*  HGB 13.7  HCT 41.4  PLT 256   Recent Labs    04/02/19 0258  NA 141  K 4.1  CL 111  CO2 21*  GLUCOSE 154*  BUN 17  CREATININE 0.79  CALCIUM 9.2    Intake/Output Summary (Last 24 hours) at 04/04/2019 0701 Last data filed at 04/03/2019 2000 Gross per 24 hour  Intake 240 ml  Output -  Net 240 ml     Physical Exam: Vital Signs Blood pressure 124/89, pulse (!) 59, temperature 98.1 F (36.7 C), temperature source Oral, resp. rate 16, height 5\' 9"  (1.753 m), weight 92.3 kg, SpO2 100 %.   General: No acute distress Mood and affect are appropriate Heart: Regular rate and rhythm no rubs murmurs or extra sounds Lungs: Clear to auscultation, breathing unlabored, no rales or wheezes Abdomen: Positive bowel sounds, soft nontender to palpation, nondistended Extremities: No clubbing, cyanosis, or edema Skin: No evidence of breakdown, no evidence of rash Neurologic: Cranial nerves II through XII intact, motor strength is 5/5 in RIght  4/5 Left  deltoid, bicep, tricep, grip,4- RIght and 3- left  hip flexor, knee extensors, ankle dorsiflexor and plantar flexor Sensory exam reduced to light touch and proprioceptionleft lower lower extremity Ext - C/C/E Musculoskeletal: Full range of motion in all 4 extremities. No joint swelling   Assessment/Plan: 1. Functional deficits secondary to MS exacerbation  which require 3+ hours per day of interdisciplinary therapy in a comprehensive inpatient rehab setting.  Physiatrist is providing close team supervision and 24 hour management of active medical problems listed below.  Physiatrist and rehab  team continue to assess barriers to discharge/monitor patient progress toward functional and medical goals  Care Tool:  Bathing              Bathing assist       Upper Body Dressing/Undressing Upper body dressing        Upper body assist      Lower Body Dressing/Undressing Lower body dressing            Lower body assist       Toileting Toileting    Toileting assist Assist for toileting: Supervision/Verbal cueing     Transfers Chair/bed transfer  Transfers assist     Chair/bed transfer assist level: Minimal Assistance - Patient > 75%     Locomotion Ambulation   Ambulation assist              Walk 10 feet activity   Assist           Walk 50 feet activity   Assist           Walk 150 feet activity   Assist           Walk 10 feet on uneven surface  activity   Assist           Wheelchair     Assist               Wheelchair 50 feet with 2 turns activity    Assist            Wheelchair 150 feet activity  Assist          Blood pressure 124/89, pulse (!) 59, temperature 98.1 F (36.7 C), temperature source Oral, resp. rate 16, height 5\' 9"  (1.753 m), weight 92.3 kg, SpO2 100 %.  Medical Problem List and Plan: 1.  Decreased functional ability with increasing muscle weakness spasms and blurred vision secondary to MS exacerbation.  3 days IV Solu-Medrol completed.  Followed by Dr. neurology services outpatient maintained on [place] as welll as dalfampridine             -CIR  Sharon Odom and OT evals today 2.  Antithrombotics: -DVT/anticoagulation: Lovenox.  Check vascular study             -antiplatelet therapy: N/A 3. Pain Management: Recent insertion of intrathecal baclofen pump 02/18/2019 for spasticity, baclofen 20 mg 3 times daily, Cymbalta 30 mg twice daily, Valium 5 mg every 8 hours as needed, Advil as needed as well as oxycodone Should be able to wean oral  antispasticity agents over  time but may need dosage change on pump, no evidence of spasticity this am  Pain does not appear to be spasticity related but instead neurogenic 4. Mood: Wellbutrin 300 mg daily, Cymbalta 20mg  BID             -antipsychotic agents: N/A 5. Neuropsych: This patient is capable of making decisions on her own behalf. 6. Skin/Wound Care: Routine skin checks 7. Fluids/Electrolytes/Nutrition: Routine in and outs with follow-up chemistries 8.  Asthma with history of tobacco abuse.  Continue nebulizers as needed as well as scheduled Singulair. 9.  GERD.  Pepcid Disposition: Upon discharge from acute rehabilitation, Mrs. Kawamoto should have outpatient follow-up with PCP, physiatry, neurology (Dr. ), and neurosurgery (Dr. Herold Harms).     LOS: 1 days A FACE TO FACE EVALUATION WAS PERFORMED  Epimenio Foot 04/04/2019, 7:01 AM

## 2019-04-04 NOTE — Evaluation (Signed)
Physical Therapy Assessment and Plan  Patient Details  Name: Sharon Odom MRN: [id number] Date of Birth: 07-26-1970  PT Diagnosis: Abnormal posture, Abnormality of gait, Difficulty walking, Impaired sensation, Muscle weakness and Pain in BLE Rehab Potential: Good ELOS: 10-12   Today's Date: 04/04/2019 PT Individual Time: 1100-1155 PT Individual Time Calculation (min): 55 min    Problem List:  Patient Active Problem List   Diagnosis Date Noted  . Asthma 04/02/2019  . Constipated 04/02/2019  . Spell of generalized weakness 03/31/2019  . Acute weakness 03/31/2019  . Presence of intrathecal baclofen pump 03/19/2019  . Dysphagia 04/14/2018  . Spastic diplegia, acquired, lower extremity ([place]) 10/16/2017  . Muscle spasticity 08/15/2017  . Gait disturbance 08/15/2017  . Depression with anxiety 08/15/2017  . Diplopia 08/15/2017  . Multiple sclerosis exacerbation ([place]) 03/04/2017    Past Medical History:  Past Medical History:  Diagnosis Date  . Allergies   . Anxiety   . Arthritis   . Asthma   . Constipation   . Depression   . GERD (gastroesophageal reflux disease)   . Headache   . History of hiatal hernia   . Multiple sclerosis ([place])   . Vision abnormalities   . Wears glasses    Past Surgical History:  Past Surgical History:  Procedure Laterality Date  . EXCISION MORTON'S NEUROMA Right   . fallopian tube removal    . INTRATHECAL PUMP IMPLANT Right 02/18/2019   Procedure: INTRATHECAL PUMP IMPLANT;  Surgeon: Clydell Hakim, MD;  Location: [facility];  Service: Neurosurgery;  Laterality: Right;  INTRATHECAL PUMP IMPLANT  . WISDOM TOOTH EXTRACTION      Assessment & Plan Clinical Impression:  Sharon Odom is a 48 year old right-handed female with history of asthma with tobacco abuse, anxiety/depression as well as multiple sclerosis diagnosed 2013 followed outpatient by neurology services Dr. Felecia Shelling maintained on [place] as well as dalfampridine.  Patient with recent placement of  baclofen pump 02/18/2019 per Dr. Maryjean Ka for increasing spasticity related to her multiple sclerosis.  Per chart review patient lives with spouse.  Two-level home with bed and bath on main level and 5 steps to entry.  Independent with assistive device prior to admission.  Presented 03/31/2019 with increasing lower extremity weakness, dysphagia, muscle spasms, blurred vision and worsening of chronic left hand weakness.  She did recently see her ophthalmologist for blurred vision as report no acute findings noted.  Admission chemistries unremarkable, WBC 11,000, SARS Covid negative.  MRI of the brain, cervical and thoracic spine with and without contrast obtained revealed multiple chronic demyelinating lesions without acute enhancement.  Neurology follow-up it was advised to begin IV Solu-Medrol x3 doses 03/31/2019 for suspected MS exacerbation.  Subcutaneous Lovenox added for DVT prophylaxis.  Speech therapy evaluation for dysphagia diet has steadily been advanced now on a regular consistency.  Therapy evaluations completed and patient was admitted for a comprehensive rehab program. Patient transferred to CIR on 04/03/2019 .   Patient currently requires min to mod with mobility secondary to muscle weakness and muscle joint tightness, abnormal tone and decreased sitting balance, decreased standing balance, decreased postural control and decreased balance strategies.  Prior to hospitalization, patient was modified independent  with mobility and lived with Spouse in a House home.  Home access is 5Stairs to enter.  Patient will benefit from skilled PT intervention to maximize safe functional mobility, minimize fall risk and decrease caregiver burden for planned discharge home with intermittent assist.  Anticipate patient will benefit from follow up [name] at discharge.  PT - End of Session Activity Tolerance: Tolerates 30+ min activity with multiple rests Endurance Deficit: Yes Endurance Deficit Description: frequent  rest breaks, fatigues quickly with functional activity PT Assessment Rehab Potential (ACUTE/IP ONLY): Good PT Barriers to Discharge: Decreased caregiver support;Medical stability;Home environment access/layout PT Patient demonstrates impairments in the following area(s): Balance;Endurance;Motor;Pain;Sensory;Safety PT Transfers Functional Problem(s): Bed Mobility;Bed to Chair;Car;Furniture;Floor PT Locomotion Functional Problem(s): Ambulation;Wheelchair Mobility;Stairs PT Plan PT Intensity: Minimum of 1-2 x/day ,45 to 90 minutes PT Frequency: 5 out of 7 days PT Duration Estimated Length of Stay: 10-12 PT Treatment/Interventions: Ambulation/gait training;Balance/vestibular training;Community reintegration;Discharge planning;Disease management/prevention;DME/adaptive equipment instruction;Functional electrical stimulation;Functional mobility training;Neuromuscular re-education;Pain management;Patient/family education;Psychosocial support;Splinting/orthotics;Stair training;Therapeutic Activities;Therapeutic Exercise;UE/LE Strength taining/ROM;UE/LE Coordination activities;Wheelchair propulsion/positioning PT Transfers Anticipated Outcome(s): mod I PT Locomotion Anticipated Outcome(s): mod I with LRAD PT Recommendation Recommendations for Other Services: Neuropsych consult;Therapeutic Recreation consult Therapeutic Recreation Interventions: Stress management;Outing/community reintergration Follow Up Recommendations: Home health PT Patient destination: Home Equipment Recommended: To be determined Equipment Details: Pt owns rollator, further equipment TBD pending progress  Skilled Therapeutic Intervention Evaluation completed (see details above and below) with education on PT POC and goals and individual treatment initiated with focus on functional transfer and gait assessment, orientation to rehab unit, orientation to schedule, POC, conference schedule, etc. Pt received seated EOB having just  finished OT session. Pt reports feeling significantly fatigued and 9/10 in BLE but agreeable to PT eval. RN notified of patient pain level. Sit to/from supine with min A for LLE management, prior to admission pt utilized leg lifter and has leg lifter available for use with LLE. Sit to stand with min to mod A to RW. Ambulation x 15 ft with RW and mod A. Pt exhibits LLE drag during gait due to significant LLE weakness, no clearance with gait. Pt also ambulates with flexed trunk and flexed knees. Per pt she utilized a Bioness estim unit for LLE control during gait prior to admission as well as ambulated with a rollator. Pt requests to return to bed at end of session. Stand pivot transfer w/c to bed with RW and min A. Sit to supine min A. Pt left semi-reclined in bed with needs in reach at end of session. RT in room at end of session for breathing treatment.  PT Evaluation Precautions/Restrictions Precautions Precautions: Fall Restrictions Weight Bearing Restrictions: No Home Living/Prior Functioning Home Living Available Help at Discharge: Family;Available PRN/intermittently Type of Home: House Home Access: Stairs to enter [employer] of Steps: 5 Entrance Stairs-Rails: Can reach both Home Layout: Two level;Able to live on main level with bedroom/bathroom  Lives With: Spouse Prior Function Level of Independence: Independent with gait;Independent with transfers  Able to Take Stairs?: Yes Vocation: On disability Vision/Perception  Perception Perception: Within Functional Limits Praxis Praxis: Intact  Cognition Overall Cognitive Status: Within Functional Limits for tasks assessed Arousal/Alertness: Awake/alert Orientation Level: Oriented X4 Attention: Focused Focused Attention: Appears intact Memory: Appears intact Awareness: Appears intact Problem Solving: Appears intact Safety/Judgment: Appears intact Sensation Sensation Light Touch: Impaired Detail Light Touch Impaired  Details: Impaired LUE;Impaired RLE;Impaired LLE Proprioception: Impaired by gross assessment Additional Comments: impaired L>R, LE>UE, distal>proximal Coordination Gross Motor Movements are Fluid and Coordinated: No Fine Motor Movements are Fluid and Coordinated: No Coordination and Movement Description: impaired 2/2 BLE and L side weakness Motor  Motor Motor: Abnormal postural alignment and control;Abnormal tone Motor - Skilled Clinical Observations: impaired 2/2 BLE and L side weakness  Mobility Bed Mobility Bed Mobility: Rolling Right;Rolling Left;Supine to Sit;Sit to Supine Rolling Right: Minimal  Assistance - Patient > 75% Rolling Left: Minimal Assistance - Patient > 75% Supine to Sit: Minimal Assistance - Patient > 75% Sit to Supine: Minimal Assistance - Patient > 75% Transfers Transfers: Stand to [employer] Pivot Transfers Stand to Sit: Moderate Assistance - Patient 50-74% Stand Pivot Transfers: Moderate Assistance - Patient 50 - 74% Stand Pivot Transfer Details: Verbal cues for sequencing;Verbal cues for precautions/safety;Verbal cues for safe use of DME/AE Transfer (Assistive device): Rolling walker Locomotion  Gait Gait Distance (Feet): 15 Feet Assistive device: Rolling walker Gait Gait Pattern: Impaired(LLE drag) Gait velocity: decreased Stairs / Additional Locomotion Stairs: No Wheelchair Mobility Wheelchair Mobility: No  Trunk/Postural Assessment  Cervical Assessment Cervical Assessment: Exceptions to WFL(forward head) Thoracic Assessment Thoracic Assessment: Exceptions to WFL(rounded shoulders) Lumbar Assessment Lumbar Assessment: Exceptions to WFL(posterior pelvic tilt) Postural Control Postural Control: Within Functional Limits  Balance Balance Balance Assessed: Yes Static Sitting Balance Static Sitting - Balance Support: No upper extremity supported;Feet supported Static Sitting - Level of Assistance: 5: Stand by assistance Dynamic Sitting  Balance Dynamic Sitting - Balance Support: No upper extremity supported;Feet supported;During functional activity Dynamic Sitting - Level of Assistance: 5: Stand by assistance;4: Min assist Static Standing Balance Static Standing - Balance Support: Bilateral upper extremity supported;During functional activity Static Standing - Level of Assistance: 4: Min assist Dynamic Standing Balance Dynamic Standing - Balance Support: Bilateral upper extremity supported;During functional activity Dynamic Standing - Level of Assistance: 4: Min assist;3: Mod assist Extremity Assessment   RLE Assessment RLE Assessment: Exceptions to [facility] Passive Range of Motion (PROM) Comments: tight HS General Strength Comments: impaired, see below RLE Strength Right Hip Flexion: 3+/5 Right Knee Flexion: 4/5 Right Knee Extension: 4/5 Right Ankle Dorsiflexion: 3/5 LLE Assessment LLE Assessment: Exceptions to [facility] Passive Range of Motion (PROM) Comments: tight HS, hip flexors, gastroc General Strength Comments: impaired, see below LLE Strength Left Hip Flexion: 0/5 Left Knee Flexion: 3/5 Left Knee Extension: 3/5 Left Ankle Dorsiflexion: 3/5    Refer to Care Plan for Long Term Goals  Recommendations for other services: Neuropsych and Therapeutic Recreation  Stress management and Outing/community reintegration  Discharge Criteria: Patient will be discharged from PT if patient refuses treatment 3 consecutive times without medical reason, if treatment goals not met, if there is a change in medical status, if patient makes no progress towards goals or if patient is discharged from hospital.  The above assessment, treatment plan, treatment alternatives and goals were discussed and mutually agreed upon: by patient   Excell Seltzer, PT, DPT 04/04/2019, 12:23 PM

## 2019-04-04 NOTE — Evaluation (Signed)
Occupational Therapy Assessment and Plan  Patient Details  Name: Sharon Odom MRN: [id number] Date of Birth: 11/07/1970  OT Diagnosis: abnormal posture, acute pain, cognitive deficits, muscle weakness (generalized) and pain in joint Rehab Potential: Rehab Potential (ACUTE ONLY): Good ELOS: 10-12   Today's Date: 04/04/2019 OT Individual Time: 0700-0800 OT Individual Time Calculation (min): 60 min     Today's Date: 04/04/2019 OT Individual Time: 1017-1100 OT Individual Time Calculation (min): 43 min     Problem List:  Patient Active Problem List   Diagnosis Date Noted  . Asthma 04/02/2019  . Constipated 04/02/2019  . Spell of generalized weakness 03/31/2019  . Acute weakness 03/31/2019  . Presence of intrathecal baclofen pump 03/19/2019  . Dysphagia 04/14/2018  . Spastic diplegia, acquired, lower extremity ([place]) 10/16/2017  . Muscle spasticity 08/15/2017  . Gait disturbance 08/15/2017  . Depression with anxiety 08/15/2017  . Diplopia 08/15/2017  . Multiple sclerosis exacerbation ([place]) 03/04/2017    Past Medical History:  Past Medical History:  Diagnosis Date  . Allergies   . Anxiety   . Arthritis   . Asthma   . Constipation   . Depression   . GERD (gastroesophageal reflux disease)   . Headache   . History of hiatal hernia   . Multiple sclerosis ([place])   . Vision abnormalities   . Wears glasses    Past Surgical History:  Past Surgical History:  Procedure Laterality Date  . EXCISION MORTON'S NEUROMA Right   . fallopian tube removal    . INTRATHECAL PUMP IMPLANT Right 02/18/2019   Procedure: INTRATHECAL PUMP IMPLANT;  Surgeon: Clydell Hakim, MD;  Location: [place];  Service: Neurosurgery;  Laterality: Right;  INTRATHECAL PUMP IMPLANT  . WISDOM TOOTH EXTRACTION      Assessment & Plan Clinical Impression:   Pt is a 48 y.o. female admitted 03/30/19 with increasing muscle weakness, spasms and blurry vision. Of note, pt had intrathecal baclofen pump inserted 02/18/19. MRI  brain, cervical and thoracic spine shows no acute demyelinating lesion. Other PMH includes multiple sclerosis, depression, asthma.  Patient currently requires min with basic self-care skills secondary to muscle weakness, decreased cardiorespiratoy endurance, abnormal tone, unbalanced muscle activation and decreased coordination and decreased standing balance and decreased balance strategies.  Prior to hospitalization, patient could complete BADL with modified independent .  Patient will benefit from skilled intervention to increase independence with basic self-care skills prior to discharge home with care partner.  Anticipate patient will require intermittent supervision and follow up home health.  OT - End of Session Activity Tolerance: Tolerates 30+ min activity with multiple rests Endurance Deficit: Yes OT Assessment Rehab Potential (ACUTE ONLY): Good OT Barriers to Discharge: Decreased caregiver support;Home environment access/layout OT Patient demonstrates impairments in the following area(s): Balance;Edema;Endurance;Motor;Pain;Safety;Sensory OT Basic ADL's Functional Problem(s): Grooming;Bathing;Dressing;Toileting OT Transfers Functional Problem(s): Toilet;Tub/Shower OT Plan OT Intensity: Minimum of 1-2 x/day, 45 to 90 minutes OT Frequency: 5 out of 7 days OT Duration/Estimated Length of Stay: 10-12 OT Treatment/Interventions: Balance/vestibular training;DME/adaptive equipment instruction;Patient/family education;Therapeutic Activities;Wheelchair propulsion/positioning;Psychosocial support;Therapeutic Exercise;Community reintegration;Functional mobility training;Self Care/advanced ADL retraining;UE/LE Strength taining/ROM;Discharge planning;Neuromuscular re-education;Skin care/wound managment;UE/LE Coordination activities;Disease mangement/prevention;Pain management;Splinting/orthotics OT Self Feeding Anticipated Outcome(s): no goal OT Basic Self-Care Anticipated Outcome(s): MOD I OT  Toileting Anticipated Outcome(s): MOD I OT Bathroom Transfers Anticipated Outcome(s): MOD I toilet, S shower OT Recommendation Patient destination: Home Follow Up Recommendations: Home health OT Equipment Recommended: None recommended by OT Equipment Details: has BSC and shower chair   Skilled Therapeutic Intervention 1:1. Pt received in bed  agreeable to OT but needing to take it slow d/t pain. Pt familiar with OT but educated on CIR, ELOS and POC. RN alerted to pain and PRN rest provided. Pt supine>sitting with S and bed rails from flat bed using leg lifter (used PTA) to manage LLE. Pt stand pivot transfer to w/c with MIN A with good safety awarness and hand placement. Pt completes grooming at sink with set up. Pt reporting need to toilet and stand pivot transfer to low toilet with RW/grab bar use with MIN A. Increased time to void bladder but no bowel void. Pt ambuates with  MIN A and RW toilet>EOB and sit>supine with S using leg lifter. Exited session with tp seated in bed, exit alamr on and call light inr each  Session 2: Pt received in bed agreeable to bathing, dressing and toileting. Supine>sitting with MOD A d/t needing A for trunk elevation. Pt sit to stand with MOD A from EOB with RW reporting "the heat impacts mysymptoms." Pt requires MIN A for ambulation and min A for occasional LLE advancement with OT foot. Pt unable to clear floor with LLE and drags foot when completing fucntional mobility. Pt toilets with MIN A and transfers into shower in same manner as stated above. Pt completes bathing at sit to stand level with S during seated bathing and MIN-MOD A for standing peri cleansing. Dressing completes with S for UB and MIN A for LB dressing.   OT Evaluation Precautions/Restrictions  Precautions Precautions: Fall Restrictions Weight Bearing Restrictions: No General Chart Reviewed: Yes Family/Caregiver Present: No Vital Signs  Pain Pain Assessment Pain Score: 5  Pain Type:  Chronic pain Pain Location: Leg Pain Orientation: Left Home Living/Prior Functioning Home Living Family/patient expects to be discharged to:: Private residence Living Arrangements: Spouse/significant other Available Help at Discharge: Family, Available PRN/intermittently Type of Home: House Home Access: Stairs to enter Technical brewer of Steps: 5 Entrance Stairs-Rails: Can reach both Home Layout: Two level, Able to live on main level with bedroom/bathroom Bathroom Shower/Tub: Multimedia programmer: Handicapped height Bathroom Accessibility: Yes Additional Comments: Husband works, pt reports having supportive neighbors who are home during day and can provide assist as needed. Plans to get home remodeled to include larger walk-in shower IADL History Leisure and Hobbies: love crafting, cooking, and shopping Prior Function Level of Independence: Independent with basic ADLs, Independent with homemaking with ambulation  Able to Take Stairs?: Yes Comments: Prior to exacerbation, pt mod indep with rollator. Since last weekend, reports "crab walking" requiring assist from husband for standing and transfers due to weakness and other symptoms ADL   Vision Baseline Vision/History: Wears glasses Wears Glasses: Reading only Patient Visual Report: No change from baseline Vision Assessment?: No apparent visual deficits Perception  Perception: Within Functional Limits Praxis Praxis: Intact Cognition   Sensation Sensation Light Touch: Impaired by gross assessment(able to feel on L side but feels different than R) Hot/Cold: Appears Intact Coordination Gross Motor Movements are Fluid and Coordinated: No Fine Motor Movements are Fluid and Coordinated: No Finger Nose Finger Test: RUE WFL, LUE ataxic and slow Motor  Motor Motor: Abnormal postural alignment and control;Abnormal tone Mobility  Bed Mobility Bed Mobility: Supine to Sit Supine to Sit: Supervision/Verbal cueing   Trunk/Postural Assessment  Cervical Assessment Cervical Assessment: (head forward) Thoracic Assessment Thoracic Assessment: (rounded shoudlers) Lumbar Assessment Lumbar Assessment: (post pelvic tilt) Postural Control Postural Control: Within Functional Limits  Balance Balance Balance Assessed: Yes Dynamic Sitting Balance Dynamic Sitting - Level of Assistance: 5:  Stand by assistance Dynamic Sitting - Balance Activities: Forward lean/weight shifting Dynamic Standing Balance Dynamic Standing - Level of Assistance: 4: Min assist Extremity/Trunk Assessment RUE Assessment RUE Assessment: Within Functional Limits LUE Assessment General Strength Comments: generalied weakness 3-/5, spasticity PTA recieving botox in shoulder, scap and bicep     Refer to Care Plan for Long Term Goals  Recommendations for other services: Neuropsych and Therapeutic Recreation  Pet therapy and Stress management   Discharge Criteria: Patient will be discharged from OT if patient refuses treatment 3 consecutive times without medical reason, if treatment goals not met, if there is a change in medical status, if patient makes no progress towards goals or if patient is discharged from hospital.  The above assessment, treatment plan, treatment alternatives and goals were discussed and mutually agreed upon: by patient  Tonny Branch 04/04/2019, 7:52 AM

## 2019-04-04 NOTE — Progress Notes (Signed)
Bilateral lower extremity venous duplex completed. Refer to "CV Proc" under chart review to view preliminary results.  04/04/2019 9:46 AM Kelby Aline., MHA, RVT, RDCS, RDMS

## 2019-04-05 MED ORDER — BISACODYL 10 MG RE SUPP
10.0000 mg | Freq: Every day | RECTAL | Status: DC | PRN
  Administered 2019-04-05 – 2019-04-16 (×6): 10 mg via RECTAL
  Filled 2019-04-05 (×6): qty 1

## 2019-04-05 MED ORDER — MAGNESIUM CITRATE PO SOLN
1.0000 | Freq: Once | ORAL | Status: AC
  Administered 2019-04-05: 1 via ORAL
  Filled 2019-04-05: qty 296

## 2019-04-05 NOTE — Progress Notes (Signed)
Attempted to disimpact patient of stool without success. Hard stool felt but unable to move down. Administered soap suds enema, mag citrate as ordered. Multiple toileting to [facility] attempts but patient unable to bear down. 1 episode of emesis. At end of shift administered suppository, stool softening somewhat. No complaints of pain or fullness.  May benefit from regular bowel program after this exacerbation.

## 2019-04-05 NOTE — Progress Notes (Signed)
[facility] PHYSICAL MEDICINE & REHABILITATION PROGRESS NOTE   Subjective/Complaints:  Stiffness in LLE   ROS- neg breathing isses, + constip, no N/V/D or abd pain   Objective:   [place] Lower Extremity Venous (dvt)  Result Date: 04/04/2019  Lower Venous Study Indications: Swelling.  Limitations: Left lower extremity immobile. Comparison Study: No prior study Performing Technologist: Gertie FeyMichelle Simonetti MHA, RDMS, RVT, RDCS  Examination Guidelines: A complete evaluation includes B-mode imaging, spectral Doppler, color Doppler, and power Doppler as needed of all accessible portions of each vessel. Bilateral testing is considered an integral part of a complete examination. Limited examinations for reoccurring indications may be performed as noted.  +---------+---------------+---------+-----------+----------+--------------+ RIGHT    CompressibilityPhasicitySpontaneityPropertiesThrombus Aging +---------+---------------+---------+-----------+----------+--------------+ CFV      Full           Yes      Yes                                 +---------+---------------+---------+-----------+----------+--------------+ SFJ      Full                                                        +---------+---------------+---------+-----------+----------+--------------+ FV Prox  Full                                                        +---------+---------------+---------+-----------+----------+--------------+ FV Mid   Full                                                        +---------+---------------+---------+-----------+----------+--------------+ FV DistalFull                                                        +---------+---------------+---------+-----------+----------+--------------+ PFV      Full                                                        +---------+---------------+---------+-----------+----------+--------------+ POP      Full           Yes      Yes                                  +---------+---------------+---------+-----------+----------+--------------+ PTV      Full                                                        +---------+---------------+---------+-----------+----------+--------------+  Unable to visualize peroneal veins  +---------+---------------+---------+-----------+----------+--------------+ LEFT     CompressibilityPhasicitySpontaneityPropertiesThrombus Aging +---------+---------------+---------+-----------+----------+--------------+ CFV      Full           Yes      Yes                                 +---------+---------------+---------+-----------+----------+--------------+ SFJ      Full                                                        +---------+---------------+---------+-----------+----------+--------------+ FV Prox  Full                                                        +---------+---------------+---------+-----------+----------+--------------+ FV Mid   Full                                                        +---------+---------------+---------+-----------+----------+--------------+ FV DistalFull                                                        +---------+---------------+---------+-----------+----------+--------------+ PFV      Full                                                        +---------+---------------+---------+-----------+----------+--------------+ POP      Full           Yes      Yes                                 +---------+---------------+---------+-----------+----------+--------------+ PTV      Full                                                        +---------+---------------+---------+-----------+----------+--------------+ Unable to visualize peroneal veins.   Summary: Right: There is no evidence of deep vein thrombosis in the lower extremity. However, portions of this examination were limited- see technologist comments above.  No cystic structure found in the popliteal fossa. Left: There is no evidence of deep vein thrombosis in the lower extremity. However, portions of this examination were limited- see technologist comments above. No cystic structure found in the popliteal fossa.  *See table(s) above for measurements and observations.    Preliminary    No results for input(s): WBC, HGB, HCT, PLT in the last 72 hours. No results  for input(s): NA, K, CL, CO2, GLUCOSE, BUN, CREATININE, CALCIUM in the last 72 hours.  Intake/Output Summary (Last 24 hours) at 04/05/2019 0738 Last data filed at 04/04/2019 2007 Gross per 24 hour  Intake 822 ml  Output -  Net 822 ml     Physical Exam: Vital Signs Blood pressure 115/66, pulse 60, temperature 98.7 F (37.1 C), temperature source Oral, resp. rate 18, height 5\' 9"  (1.753 m), weight 92.3 kg, SpO2 99 %.   General: No acute distress Mood and affect are appropriate Heart: Regular rate and rhythm no rubs murmurs or extra sounds Lungs: Clear to auscultation, breathing unlabored, no rales or wheezes Abdomen: Positive bowel sounds, soft nontender to palpation, nondistended Extremities: No clubbing, cyanosis, or edema Skin: No evidence of breakdown, no evidence of rash Neurologic: Cranial nerves II through XII intact, motor strength is 5/5 in RIght  4/5 Left  deltoid, bicep, tricep, grip,4- RIght and 3- left  hip flexor, knee extensors, ankle dorsiflexor and plantar flexor Sensory exam reduced to light touch and proprioceptionleft lower lower extremity Ext - C/C/E Musculoskeletal: Full range of motion in all 4 extremities. No joint swelling   Assessment/Plan: 1. Functional deficits secondary to MS exacerbation  which require 3+ hours per day of interdisciplinary therapy in a comprehensive inpatient rehab setting.  Physiatrist is providing close team supervision and 24 hour management of active medical problems listed below.  Physiatrist and rehab team continue to assess  barriers to discharge/monitor patient progress toward functional and medical goals  Care Tool:  Bathing    Body parts bathed by patient: Right arm, Left arm, Chest, Abdomen, Front perineal area, Buttocks, Right upper leg, Left upper leg, Right lower leg, Left lower leg, Face         Bathing assist Assist Level: Minimal Assistance - Patient > 75%     Upper Body Dressing/Undressing Upper body dressing   What is the patient wearing?: Pull over shirt    Upper body assist Assist Level: Minimal Assistance - Patient > 75%    Lower Body Dressing/Undressing Lower body dressing      What is the patient wearing?: Underwear/pull up, Pants     Lower body assist Assist for lower body dressing: Minimal Assistance - Patient > 75%     Toileting Toileting    Toileting assist Assist for toileting: Supervision/Verbal cueing     Transfers Chair/bed transfer  Transfers assist     Chair/bed transfer assist level: Moderate Assistance - Patient 50 - 74%     Locomotion Ambulation   Ambulation assist      Assist level: Moderate Assistance - Patient 50 - 74% Assistive device: Walker-rolling Max distance: 15'   Walk 10 feet activity   Assist     Assist level: Moderate Assistance - Patient - 50 - 74% Assistive device: Walker-rolling   Walk 50 feet activity   Assist Walk 50 feet with 2 turns activity did not occur: Safety/medical concerns         Walk 150 feet activity   Assist Walk 150 feet activity did not occur: Safety/medical concerns         Walk 10 feet on uneven surface  activity   Assist Walk 10 feet on uneven surfaces activity did not occur: Safety/medical concerns         Wheelchair     Assist Will patient use wheelchair at discharge?: (TBD)   Wheelchair activity did not occur: Safety/medical concerns         Wheelchair 50  feet with 2 turns activity    Assist    Wheelchair 50 feet with 2 turns activity did not occur:  Safety/medical concerns       Wheelchair 150 feet activity     Assist  Wheelchair 150 feet activity did not occur: Safety/medical concerns       Blood pressure 115/66, pulse 60, temperature 98.7 F (37.1 C), temperature source Oral, resp. rate 18, height 5\' 9"  (1.753 m), weight 92.3 kg, SpO2 99 %.  Medical Problem List and Plan: 1.  Decreased functional ability with increasing muscle weakness spasms and blurred vision secondary to MS exacerbation.  3 days IV Solu-Medrol completed.  Followed by Dr. Epimenio Foot neurology services outpatient maintained on [place] as welll as dalfampridine             -CIR  PT and OT , amb Min/Mod A  2.  Antithrombotics: -DVT/anticoagulation: Lovenox. vascular study negative but limited              -antiplatelet therapy: N/A 3. Pain Management: Recent insertion of intrathecal baclofen pump 02/18/2019 for spasticity, baclofen 20 mg 3 times daily, Cymbalta 30 mg twice daily, Valium 5 mg every 8 hours as needed, Advil as needed as well as oxycodone Should be able to wean oral  antispasticity agents over time but may need dosage change on pump, no evidence of spasticity this am  Pain does not appear to be spasticity related , pt does not feel like she has nerve pain (has been on gabapentin in past) 4. Mood: Wellbutrin 300 mg daily, Cymbalta 20mg  BID             -antipsychotic agents: N/A 5. Neuropsych: This patient is capable of making decisions on her own behalf. 6. Skin/Wound Care: Routine skin checks 7. Fluids/Electrolytes/Nutrition: Routine in and outs with follow-up chemistries 8.  Asthma with history of tobacco abuse.  Continue nebulizers as needed as well as scheduled Singulair. 9.  GERD.  Pepcid Disposition: Upon discharge from acute rehabilitation, Mrs. [place] should have outpatient follow-up with PCP, physiatry, neurology (Dr. Epimenio Foot), and neurosurgery (Dr. Ollen Bowl).   10.  Constipation , neurogenic bowel order dulc supp prn, may do well with daily bowel  program No BM x >7 d despite sorbitol, give Mg Citrate this am   LOS: 2 days A FACE TO FACE EVALUATION WAS PERFORMED  Erick Colace 04/05/2019, 7:38 AM

## 2019-04-06 ENCOUNTER — Inpatient Hospital Stay (HOSPITAL_COMMUNITY): Payer: BC Managed Care – PPO

## 2019-04-06 LAB — COMPREHENSIVE METABOLIC PANEL
ALT: 71 U/L — ABNORMAL HIGH (ref 0–44)
AST: 36 U/L (ref 15–41)
Albumin: 3.4 g/dL — ABNORMAL LOW (ref 3.5–5.0)
Alkaline Phosphatase: 79 U/L (ref 38–126)
Anion gap: 10 (ref 5–15)
BUN: 15 mg/dL (ref 6–20)
CO2: 24 mmol/L (ref 22–32)
Calcium: 9.1 mg/dL (ref 8.9–10.3)
Chloride: 103 mmol/L (ref 98–111)
Creatinine, Ser: 0.91 mg/dL (ref 0.44–1.00)
GFR calc Af Amer: >60 mL/min (ref >60)
GFR calc non Af Amer: >60 mL/min (ref >60)
Glucose, Bld: 119 mg/dL — ABNORMAL HIGH (ref 70–99)
Potassium: 4.2 mmol/L (ref 3.5–5.1)
Sodium: 137 mmol/L (ref 135–145)
Total Bilirubin: 0.5 mg/dL (ref 0.3–1.2)
Total Protein: 6.3 g/dL — ABNORMAL LOW (ref 6.5–8.1)

## 2019-04-06 LAB — CBC WITH DIFFERENTIAL/PLATELET
Abs Immature Granulocytes: 0.12 10*3/uL — ABNORMAL HIGH (ref 0.00–0.07)
Basophils Absolute: 0 10*3/uL (ref 0.0–0.1)
Basophils Relative: 0 %
Eosinophils Absolute: 0.3 10*3/uL (ref 0.0–0.5)
Eosinophils Relative: 3 %
HCT: 47.5 % — ABNORMAL HIGH (ref 36.0–46.0)
Hemoglobin: 15.6 g/dL — ABNORMAL HIGH (ref 12.0–15.0)
Immature Granulocytes: 1 %
Lymphocytes Relative: 13 %
Lymphs Abs: 1.1 10*3/uL (ref 0.7–4.0)
MCH: 32.6 pg (ref 26.0–34.0)
MCHC: 32.8 g/dL (ref 30.0–36.0)
MCV: 99.2 fL (ref 80.0–100.0)
Monocytes Absolute: 0.8 10*3/uL (ref 0.1–1.0)
Monocytes Relative: 9 %
Neutro Abs: 6.5 10*3/uL (ref 1.7–7.7)
Neutrophils Relative %: 74 %
Platelets: 273 10*3/uL (ref 150–400)
RBC: 4.79 MIL/uL (ref 3.87–5.11)
RDW: 12.4 % (ref 11.5–15.5)
WBC: 8.8 10*3/uL (ref 4.0–10.5)
nRBC: 0 % (ref 0.0–0.2)

## 2019-04-06 MED ORDER — SENNOSIDES-DOCUSATE SODIUM 8.6-50 MG PO TABS
1.0000 | ORAL_TABLET | ORAL | Status: DC
  Administered 2019-04-06 – 2019-04-16 (×11): 1 via ORAL
  Filled 2019-04-06 (×11): qty 1

## 2019-04-06 MED ORDER — LEVETIRACETAM 500 MG PO TABS
500.0000 mg | ORAL_TABLET | Freq: Two times a day (BID) | ORAL | Status: DC
  Administered 2019-04-06 – 2019-04-15 (×19): 500 mg via ORAL
  Filled 2019-04-06 (×19): qty 1

## 2019-04-06 NOTE — Progress Notes (Signed)
Occupational Therapy Session Note  Patient Details  Name: Sharon Odom MRN: [id number] Date of Birth: 1970-08-31  Today's Date: 04/06/2019 OT Individual Time: 6222-9798 OT Individual Time Calculation (min): 75 min    Short Term Goals: Week 1:  OT Short Term Goal 1 (Week 1): Pt will demo energy conservation techniques during LB dressing wiht no VC OT Short Term Goal 2 (Week 1): Pt will transfer to BSC/toilet wiht S consistently OT Short Term Goal 3 (Week 1): Pt will groom in standing wiht S for improved endurance  Skilled Therapeutic Interventions/Progress Updates:    Pt resting in bed upon arrival and agreeable to therapy.  Pt communicated that she preferred a female therapist assisted with BADLs.  Pt stated she wanted to "work on" [place]. Pt sat EOB with supervision using leg lifter to assist with LLE. Pt engaged in LUE AROM and AAROM activities including scapula stabilization activites and upper trap activities.  Proximal strength<distal strength.  Discussed home bathroom arrangement and pt stated her bathroom was going to be remodeled to a zero entry shower.  Currently with 4" ledge which "terrifies" pt. Showers now are an all day affair because of her anxiety. Pt performed sit<>stand X 3 with side stepping to return to supine. Pt remained in bed with all needs within reach and bed alarm activated.   Therapy Documentation Precautions:  Precautions Precautions: Fall Restrictions Weight Bearing Restrictions: No   Pain: Pain Assessment Pain Scale: 0-10 Pain Score: 3  Pain Type: Chronic pain;Neuropathic pain Pain Location: Foot, LUE Pain Orientation: Right;Left Pain Descriptors / Indicators: Aching;Pins and needles Pain Frequency: Constant Pain Onset: On-going Patients Stated Pain Goal: 3 Pain Intervention(s): Meds admin prior to therapy; emotional support; gentle massage  Therapy/Group: Individual Therapy  Leroy Libman 04/06/2019, 10:32 AM

## 2019-04-06 NOTE — Plan of Care (Signed)
  Problem: Consults Goal: RH GENERAL PATIENT EDUCATION Description: See Patient Education module for education specifics. Outcome: Progressing   Problem: RH BOWEL ELIMINATION Goal: RH STG MANAGE BOWEL WITH ASSISTANCE Description: STG Manage Bowel with mod I Assistance. Outcome: Progressing Goal: RH STG MANAGE BOWEL W/MEDICATION W/ASSISTANCE Description: STG Manage Bowel with Medication with mod I Assistance. Outcome: Progressing   Problem: RH SAFETY Goal: RH STG ADHERE TO SAFETY PRECAUTIONS W/ASSISTANCE/DEVICE Description: STG Adhere to Safety Precautions With supervision Assistance/Device. Outcome: Progressing   Problem: RH PAIN MANAGEMENT Goal: RH STG PAIN MANAGED AT OR BELOW PT'S PAIN GOAL Description: Less than 4 on 0-10 scale  Outcome: Progressing   Problem: RH KNOWLEDGE DEFICIT GENERAL Goal: RH STG INCREASE KNOWLEDGE OF SELF CARE AFTER HOSPITALIZATION Description: Pt will be able to verbalize ways to manage pain and safety precautions to use at home with min assist prior to DC Outcome: Progressing

## 2019-04-06 NOTE — Progress Notes (Signed)
[facility] - PAIN MANAGEMENT  Name: Sharon Odom MRN: [id number] DOB: 07-10-1970     SUBJECTIVE:  Sharon Odom appears to be doing much better. Her right sided symptoms to include spasticity and muscle spasms have improved.  Her left sided lower extremity symptoms remain however appear to be at baseline.  She still has cramping in the left upper extremity.  She feels her pain has worsened since decreasing her IT baclofen pump.  She requested we continue to increase it as she felt she was initially making some progress with the small escalations in dose we were doing prior to her admission.  She is reporting some fatigue today but overall feels better.     VITAL SIGNS: Temp:  [98.3 F (36.8 C)-98.6 F (37 C)] 98.3 F (36.8 C) (11/09 1927) Pulse Rate:  [67-74] 74 (11/09 1927) Resp:  [16] 16 (11/09 1927) BP: (105-111)/(70-75) 105/70 (11/09 1927) SpO2:  [93 %] 93 % (11/09 1927)  PHYSICAL EXAMINATION: General: lying in bed in NAD. Neuro: Alert, oriented, neurologically intact HEENT: [place]/AT. Sclerae anicteric.  EOMI. Cardiovascular: RRR  Lungs: Respirations even and unlabored.   Abdomen: incision clean, dry and intact, nontender Musculoskeletal: decreased grip strength in the left upper extremity, difficulty with full extension on digits Skin: Intact, warm, no rashes.    ASSESSMENT / PLAN:  The patient was not pleased with her pump being decreased at the time of admission.  Initially the plan was to increase her 10% back to where she was at the time of admission however the patient really felt it needed to be increased more.  She tolerated previous increases of 20-22% without issue.  We agreed to a 15% increase.  I asked her or her husband to notify us of any issues with the increase.  She can follow up in clinic when discharged or we can see her during her admission if needed.  Simeon Craft, [facility] - Pain Management

## 2019-04-06 NOTE — Progress Notes (Signed)
Social Work Assessment and Plan   Patient Details  Name: Sharon Odom MRN: [id number] Date of Birth: 1970-08-11  Today's Date: 04/06/2019  Problem List:  Patient Active Problem List   Diagnosis Date Noted  . Steroid-induced hyperglycemia   . Hypoalbuminemia due to protein-calorie malnutrition (HCC)   . Neurogenic bowel   . Asthma 04/02/2019  . Constipated 04/02/2019  . Spell of generalized weakness 03/31/2019  . Acute weakness 03/31/2019  . Presence of intrathecal baclofen pump 03/19/2019  . Dysphagia 04/14/2018  . Spastic diplegia, acquired, lower extremity (HCC) 10/16/2017  . Muscle spasticity 08/15/2017  . Gait disturbance 08/15/2017  . Depression with anxiety 08/15/2017  . Diplopia 08/15/2017  . Multiple sclerosis exacerbation (HCC) 03/04/2017   Past Medical History:  Past Medical History:  Diagnosis Date  . Allergies   . Anxiety   . Arthritis   . Asthma   . Constipation   . Depression   . GERD (gastroesophageal reflux disease)   . Headache   . History of hiatal hernia   . Multiple sclerosis (HCC)   . Vision abnormalities   . Wears glasses    Past Surgical History:  Past Surgical History:  Procedure Laterality Date  . EXCISION MORTON'S NEUROMA Right   . fallopian tube removal    . INTRATHECAL PUMP IMPLANT Right 02/18/2019   Procedure: INTRATHECAL PUMP IMPLANT;  Surgeon: Odette Fraction, MD;  Location: [facility] OR;  Service: Neurosurgery;  Laterality: Right;  INTRATHECAL PUMP IMPLANT  . WISDOM TOOTH EXTRACTION     Social History:  reports that she has been smoking cigarettes. She has been smoking about 0.50 packs per day. She has never used smokeless tobacco. She reports current alcohol use. She reports that she does not use drugs.  Family / Support Systems Marital Status: Married How Long?: 23 yrs Patient Roles: Spouse Spouse/Significant Other: Sharon Odom Children: none Other Supports: neighbors Anticipated Caregiver: husband vs neighbors Ability/Limitations of  Caregiver: Min A Caregiver Availability: Intermittent Family Dynamics: Pt notes that husband is very supportive, however, does work f/t.  All family living in other states and this does sadden her.  Social History Preferred language: English Religion:  Cultural Background: NA Read: Yes Write: Yes Employment Status: Disabled Date Retired/Disabled/Unemployed: 2014 Marine scientist Issues: None Guardian/Conservator: None - per MD, pt is capable of making decisions on her own behalf.   Abuse/Neglect Abuse/Neglect Assessment Can Be Completed: Yes Physical Abuse: Denies Verbal Abuse: Denies Sexual Abuse: Denies Exploitation of patient/patient's resources: Denies Self-Neglect: Denies  Emotional Status Pt's affect, behavior and adjustment status: Pt very pleasant and able to complete assessment without any difficulty.  She is "guarded but hopeful" about her baclofen pump which was placed in Sept but had "hoped I'd see something a little quicker."  She talks openly her feeling of isolation at times, however, does have good support from spouse and neighbors.  May benefit from neuropsychology consult while here - will talk with tx team. Recent Psychosocial Issues: None recent beyond chronic health issues. Psychiatric History: None per pt Substance Abuse History: None  Patient / Family Perceptions, Expectations & Goals Pt/Family understanding of illness & functional limitations: Pt and spouse with good understanding of her MS flare and current functional limitations/ need for CIR. Premorbid pt/family roles/activities: Pt using walker in the home.  Limited community level activity. Anticipated changes in roles/activities/participation: Little change anticipated if pt able to reach mod independent goals. Pt/family expectations/goals: "I just hope I will begin to see the benefit of the pump soon."  Community Resources [employer]: Other (Comment)(connected to local MS Society  chapter) Premorbid Home Care/DME Agencies: Other (Comment)(followed by [facility]) Transportation available at discharge: yes Resource referrals recommended: Neuropsychology  Discharge Planning Living Arrangements: Spouse/significant other Support Systems: Spouse/significant other, Friends/neighbors Type of Residence: Private residence Insurance Resources: Multimedia programmer (specify)([employer] of [place]) Museum/gallery curator Resources: SSD, Family Support Financial Screen Referred: No Living Expenses: Medical laboratory scientific officer Management: Spouse Does the patient have any problems obtaining your medications?: No Home Management: Pt does what she can. Patient/Family Preliminary Plans: Pt to d/c home with spouse who is home in the evenings.  Neighbors close by who pt can reach if an emergeny as well. Social Work Anticipated Follow Up Needs: HH/OP Expected length of stay: 10-12 days  Clinical Impression Very pleasant, oriented woman here for MS flare and functional decline.  Very motivated for CIR and recently had placement on baclofen pump as well (Sept 2020).  Good support of spouse and neighbors and goals set for mod independent overall.  Will follow for support and d/c planning needs.  Sharon Odom 04/06/2019, 10:53 AM

## 2019-04-06 NOTE — Progress Notes (Signed)
[place] PHYSICAL MEDICINE & REHABILITATION PROGRESS NOTE   Subjective/Complaints:  Pt reports spasticity is worse on L sided- Needs ITB increased- already having severe constipation.  2 BMs Sunday- doesn't feel constipation anymore, but it's a big issue for her.  Lyrica caused weight gain; Cymbalta not helping 60 mg; Wondering about going back on Gabapentin for nerve pain- will try Keppra for nerve pain since more weight neutral and went over side effects; don't need labs.  ROS- neg breathing isses, feels like legs are swollen L>R; denies CP, SOB, N/V/D  Objective:   Vas [place] Lower Extremity Venous (dvt)  Result Date: 04/05/2019  Lower Venous Study Indications: Swelling.  Limitations: Left lower extremity immobile. Comparison Study: No prior study Performing Technologist: Gertie Fey MHA, RDMS, RVT, RDCS  Examination Guidelines: A complete evaluation includes B-mode imaging, spectral Doppler, color Doppler, and power Doppler as needed of all accessible portions of each vessel. Bilateral testing is considered an integral part of a complete examination. Limited examinations for reoccurring indications may be performed as noted.  +---------+---------------+---------+-----------+----------+--------------+ RIGHT    CompressibilityPhasicitySpontaneityPropertiesThrombus Aging +---------+---------------+---------+-----------+----------+--------------+ CFV      Full           Yes      Yes                                 +---------+---------------+---------+-----------+----------+--------------+ SFJ      Full                                                        +---------+---------------+---------+-----------+----------+--------------+ FV Prox  Full                                                        +---------+---------------+---------+-----------+----------+--------------+ FV Mid   Full                                                         +---------+---------------+---------+-----------+----------+--------------+ FV DistalFull                                                        +---------+---------------+---------+-----------+----------+--------------+ PFV      Full                                                        +---------+---------------+---------+-----------+----------+--------------+ POP      Full           Yes      Yes                                 +---------+---------------+---------+-----------+----------+--------------+  PTV      Full                                                        +---------+---------------+---------+-----------+----------+--------------+ Unable to visualize peroneal veins  +---------+---------------+---------+-----------+----------+--------------+ LEFT     CompressibilityPhasicitySpontaneityPropertiesThrombus Aging +---------+---------------+---------+-----------+----------+--------------+ CFV      Full           Yes      Yes                                 +---------+---------------+---------+-----------+----------+--------------+ SFJ      Full                                                        +---------+---------------+---------+-----------+----------+--------------+ FV Prox  Full                                                        +---------+---------------+---------+-----------+----------+--------------+ FV Mid   Full                                                        +---------+---------------+---------+-----------+----------+--------------+ FV DistalFull                                                        +---------+---------------+---------+-----------+----------+--------------+ PFV      Full                                                        +---------+---------------+---------+-----------+----------+--------------+ POP      Full           Yes      Yes                                  +---------+---------------+---------+-----------+----------+--------------+ PTV      Full                                                        +---------+---------------+---------+-----------+----------+--------------+ Unable to visualize peroneal veins.   Summary: Right: There is no evidence of deep vein thrombosis in the lower extremity. However, portions of this examination were limited- see technologist comments above. No cystic structure found in the popliteal  fossa. Left: There is no evidence of deep vein thrombosis in the lower extremity. However, portions of this examination were limited- see technologist comments above. No cystic structure found in the popliteal fossa.  *See table(s) above for measurements and observations. Electronically signed by Curt Jews MD on 04/05/2019 at 9:03:54 AM.    Final    Recent Labs    04/06/19 0628  WBC 8.8  HGB 15.6*  HCT 47.5*  PLT 273   Recent Labs    04/06/19 0628  NA 137  K 4.2  CL 103  CO2 24  GLUCOSE 119*  BUN 15  CREATININE 0.91  CALCIUM 9.1    Intake/Output Summary (Last 24 hours) at 04/06/2019 0903 Last data filed at 04/06/2019 0737 Gross per 24 hour  Intake 620 ml  Output -  Net 620 ml     Physical Exam: Vital Signs Blood pressure 111/75, pulse 67, temperature 98.6 F (37 C), resp. rate 16, height 5\' 9"  (1.753 m), weight 92.3 kg, SpO2 93 %.   General: No acute distress Sitting up in bed; appropriate, wearing fuzzy sweater, NAD Mood and affect are appropriate Heart: Regular rate and rhythm  Lungs: Clear to auscultation,  Abdomen: (+) BS, soft, ITB pump in abdomen; ND, NT Extremities: No clubbing, cyanosis, or edema Skin: No evidence of breakdown, no evidence of rash Neurologic: Cranial nerves II through XII intact, motor strength is 5/5 in RIght  4/5 Left  deltoid, bicep, tricep, grip,4- RIght and 3- left  hip flexor, knee extensors, ankle dorsiflexor and plantar flexor Sensory exam reduced to light touch and  proprioceptionleft lower lower extremity Ext - C/C/E Musculoskeletal: Full range of motion in all 4 extremities. No joint swelling MAS of 2-3 in LLE and 1+ in LUE  Assessment/Plan: 1. Functional deficits secondary to MS exacerbation  which require 3+ hours per day of interdisciplinary therapy in a comprehensive inpatient rehab setting.  Physiatrist is providing close team supervision and 24 hour management of active medical problems listed below.  Physiatrist and rehab team continue to assess barriers to discharge/monitor patient progress toward functional and medical goals  Care Tool:  Bathing    Body parts bathed by patient: Right arm, Left arm, Chest, Abdomen, Front perineal area, Buttocks, Right upper leg, Left upper leg, Right lower leg, Left lower leg, Face         Bathing assist Assist Level: Minimal Assistance - Patient > 75%     Upper Body Dressing/Undressing Upper body dressing   What is the patient wearing?: Bra, Pull over shirt    Upper body assist Assist Level: Supervision/Verbal cueing    Lower Body Dressing/Undressing Lower body dressing      What is the patient wearing?: Underwear/pull up, Pants     Lower body assist Assist for lower body dressing: Minimal Assistance - Patient > 75%     Toileting Toileting    Toileting assist Assist for toileting: Minimal Assistance - Patient > 75%     Transfers Chair/bed transfer  Transfers assist     Chair/bed transfer assist level: Minimal Assistance - Patient > 75%     Locomotion Ambulation   Ambulation assist      Assist level: Moderate Assistance - Patient 50 - 74% Assistive device: Walker-rolling Max distance: 15'   Walk 10 feet activity   Assist     Assist level: Moderate Assistance - Patient - 50 - 74% Assistive device: Walker-rolling   Walk 50 feet activity   Assist Walk 50 feet with  2 turns activity did not occur: Safety/medical concerns         Walk 150 feet  activity   Assist Walk 150 feet activity did not occur: Safety/medical concerns         Walk 10 feet on uneven surface  activity   Assist Walk 10 feet on uneven surfaces activity did not occur: Safety/medical concerns         Wheelchair     Assist Will patient use wheelchair at discharge?: (TBD)   Wheelchair activity did not occur: Safety/medical concerns         Wheelchair 50 feet with 2 turns activity    Assist    Wheelchair 50 feet with 2 turns activity did not occur: Safety/medical concerns       Wheelchair 150 feet activity     Assist  Wheelchair 150 feet activity did not occur: Safety/medical concerns       Blood pressure 111/75, pulse 67, temperature 98.6 F (37 C), resp. rate 16, height 5\' 9"  (1.753 m), weight 92.3 kg, SpO2 93 %.  Medical Problem List and Plan: 1.  Decreased functional ability with increasing muscle weakness spasms and blurred vision secondary to MS exacerbation.  3 days IV Solu-Medrol completed.  Followed by Dr. Epimenio FootSater neurology services outpatient [place] as well[place] as dalfampridine             -CIR  PT and OT evals today 2.  Antithrombotics: -DVT/anticoagulation: Lovenox.  Check vascular study             -antiplatelet therapy: N/A 3. Pain Management/spasticity: Recent insertion of intrathecal baclofen pump 02/18/2019 for spasticity, baclofen 20 mg 3 times daily, Cymbalta 30 mg twice daily, Valium 5 mg every 8 hours as needed, Advil as needed as well as oxycodone Should be able to wean oral  antispasticity agents over time but may need dosage change on pump, no evidence of spasticity this am  Pain does not appear to be spasticity related but instead neurogenic 11/9- Will call Dr Ollen BowlHarkins to increase ITB/pump- can increase myself, but don't want to step on toes; for nerve pain, will try Keppra 500 mg BID 4. Mood: Wellbutrin 300 mg daily, Cymbalta 20mg  BID             -antipsychotic agents: N/A 5. Neuropsych: This  patient is capable of making decisions on her own behalf. 6. Skin/Wound Care: Routine skin checks 7. Fluids/Electrolytes/Nutrition: Routine in and outs with follow-up chemistries 8.  Asthma with history of tobacco abuse.  Continue nebulizers as needed as well as scheduled Singulair. 9.  GERD.  Pepcid 10. Neurogenic bowel with constipation- due to MS and ITB pump- will con't Miralax and add Senokot-S 2 tabs qAM Disposition: Upon discharge from acute rehabilitation, Mrs. Sharon Odom should have outpatient follow-up with PCP, physiatry, neurology (Dr. Epimenio FootSater), and neurosurgery (Dr. Ollen BowlHarkins).     LOS: 3 days A FACE TO FACE EVALUATION WAS PERFORMED  Ferron Ishmael 04/06/2019, 9:03 AM

## 2019-04-06 NOTE — Progress Notes (Signed)
Patient information reviewed and entered into [facility] System by Becky Jabez Molner, PPS coordinator. Information including medical coding, function ability, and quality indicators will be reviewed and updated through discharge.   

## 2019-04-06 NOTE — Progress Notes (Signed)
Physical Therapy Session Note  Patient Details  Name: Sharon Odom MRN: [id number] Date of Birth: [birth year]  Today's Date: 04/06/2019 PT Individual Time: 1100-1126 and 1400-1530 PT Individual Time Calculation (min): 26 min and 90 min   Short Term Goals: Week 1:  PT Short Term Goal 1 (Week 1): Pt will complete least restrictive transfers with CGA consistently PT Short Term Goal 2 (Week 1): Pt will ambulate x 50 ft with LRAD and min A PT Short Term Goal 3 (Week 1): Pt will initiate stair training  Skilled Therapeutic Interventions/Progress Updates:     Session 1: Patient on [facility] with RN in room upon PT arrival. Patient alert and agreeable to PT session. Patient reported 4/10 B LE pain during session, RN aware and provided pain meds prior to session. PT provided repositioning, rest breaks, and distraction as pain interventions throughout session.   Therapeutic Activity: Bed Mobility: Patient performed sit to supine with supervision for safety using a leg lifter in a flat bed without use of bed rails.  Transfers: Patient performed a stand pivot transfer from the [facility] to the bed with min A using a RW. Provided verbal cues for hand placement on RW, and reaching back to sit. Noted decreased foot clearance with stepping on L with L knee locked in extension during transfer  Discussed patient's POC and PLOF throughout session. Patient reports that she uses a Bioness device with a Rollator for walking at home and short distances and a w/c for community ambulation. She reported that she did not have any difficulties going up/down steps, but had difficulty getting into her husband's trunk. Stated that they borrow a neighbor's sedan when she is unable to get into the truck. Patient seems to be well aware of her deficits PTA, however, is unable to discern new deficits from chronic deficits with current mobility. Educated on weakness, fatigue and nerve pain related to MS and energy conservation techniques to  utilize to manage fatigue. Instructed patient on use of the modified 10-point RPE scale to rate fatigue with activity.   Patient in bed at end of session with breaks locked, bed alarm set, and all needs within reach.   Session 2: Patient in bed upon PT arrival. Patient alert and agreeable to PT session. Patient reported 5/10 B LE pain during session, reported receiving pain medicine prior to session. PT provided repositioning, rest breaks, and distraction as pain interventions throughout session. Patient requested to try ambulating with the Rollator as she does not like the RW.   Therapeutic Activity: Bed Mobility: Patient performed supine to sit with supervision with the [facility] elevated, patient states she has a bed that allows her to elevated the [facility] and LEs at home. She performed sit to supine with min A for LEs at end of session, increased assist due to fatigue.  Transfers: Patient performed sit to/from stand x2 and stand pivot x1 using the RW and sit to/from stand x1 and stand pivot x1 using a Rollator with min A progressing to mod A with fatigue during session. Provided verbal cues for hand placement, use of breaks with the rollator, as patient prefers to use hand breaks rather than locking breaks during transfers. PT educated on increased safety with breaks locks. During the last transfer to the bed patient used the rollator and was unable to use B UE to support her as she attempted to step with her L foot and required max A to pivot to sit on the bed safely. Patient was continent of  bladder on the [facility] over the toilet during session. Required supervision for peri-care and LB dressing with min A for balance holding on to the RW.  Gait Training:  Patient ambulated 15 feet to the bathroom using RW without wearing the Bioness system with min-mod A and PT guarding L knee to prevent buckling. Ambulated with step-to gait pattern leading with R, decreased L foot clearance and significant foot drag, L knee  locking in extension for the first few steps then remained in flexion with mild knee buckling after, and also with R knee flexed throughout. Provided verbal cues for erect posture, use of UE to shift weight R for increased L foot clearance. She then ambulated 22 feet in the hallway with Bioness system donned with max A from therapist and patient directing placement. She ambulated as above with decreased L knee buckling as only change in gait pattern. She then attempted to ambulate with the Rollator and ambulated 4 feet before feeling fatigued and requesting to sit. She performed a stand to sit using the rollator with mod A with cues for use of breaks and hand placement throughout.   Wheelchair Mobility:  Patient was transported in the w/c with total A throughout session for energy conservation and time management, also limited by spasticity of L UE with fatigue.   Patient in bed at end of session with breaks locked, bed alarm set, and all needs within reach. PT educated on risk of Rollator moving away from her and providing less UE support than the RW and suggested that she use the RW only at this time until her strength and activity tolerance improve, patient in agreement.   Therapy Documentation Precautions:  Precautions Precautions: Fall Restrictions Weight Bearing Restrictions: No    Therapy/Group: Individual Therapy  Alyiah Ulloa L Lyrique Hakim PT, DPT  04/06/2019, 4:20 PM

## 2019-04-06 NOTE — IPOC Note (Signed)
Overall Plan of Care [facility]) Patient Details Name: Sharon Odom MRN: [id number] DOB: 10/25/1970  Admitting Diagnosis: Multiple sclerosis exacerbation [facility])  Hospital Problems: Principal Problem:   Multiple sclerosis exacerbation ([place]) Active Problems:   Muscle spasticity   Gait disturbance   Spastic diplegia, acquired, lower extremity (H[facility])   Presence of intrathecal baclofen pump   Asthma     Functional Problem List: Nursing Pain, Safety  PT Balance, Endurance, Motor, Pain, Sensory, Safety  OT Balance, Edema, Endurance, Motor, Pain, Safety, Sensory  SLP    TR         Basic ADL's: OT Grooming, Bathing, Dressing, Toileting     Advanced  ADL's: OT       Transfers: PT Bed Mobility, Bed to Chair, Car, [employer], Futures trader, Metallurgist: PT Ambulation, Emergency planning/management officer, Stairs     Additional Impairments: OT    SLP        TR      Anticipated Outcomes Item Anticipated Outcome  Self Feeding no goal  Swallowing      Basic self-care  MOD I  Toileting  MOD I   Bathroom Transfers MOD I toilet, S shower  Bowel/Bladder  Patient will remain continence of bowel and bladder.  Transfers  mod I  Locomotion  mod I with LRAD  Communication     Cognition     Pain  Patient will be able to tolerate pain less than 3 on a scale of 0 to 10.  Safety/Judgment  Patient will adhere to all safety precautions.   Therapy Plan: PT Intensity: Minimum of 1-2 x/day ,45 to 90 minutes PT Frequency: 5 out of 7 days PT Duration Estimated Length of Stay: 10-12 OT Intensity: Minimum of 1-2 x/day, 45 to 90 minutes OT Frequency: 5 out of 7 days OT Duration/Estimated Length of Stay: 10-12     Due to the current state of emergency, patients may not be receiving their 3-hours of Medicare-mandated therapy.   Team Interventions: Nursing Interventions Pain Management, Discharge Planning, Patient/Family Education  PT interventions Ambulation/gait training,  Training and development officer, Community reintegration, Discharge planning, Disease management/prevention, DME/adaptive equipment instruction, Functional electrical stimulation, Functional mobility training, Neuromuscular re-education, Pain management, Patient/family education, Psychosocial support, Splinting/orthotics, Stair training, Therapeutic Activities, Therapeutic Exercise, UE/LE Strength taining/ROM, UE/LE Coordination activities, Wheelchair propulsion/positioning  OT Interventions Training and development officer, Engineer, drilling, Patient/family education, Therapeutic Activities, Wheelchair propulsion/positioning, Psychosocial support, Therapeutic Exercise, Community reintegration, Functional mobility training, Self Care/advanced ADL retraining, UE/LE Strength taining/ROM, Discharge planning, Neuromuscular re-education, Skin care/wound managment, UE/LE Coordination activities, Disease mangement/prevention, Pain management, Splinting/orthotics  SLP Interventions    TR Interventions    SW/CM Interventions Discharge Planning, Psychosocial Support, Patient/Family Education   Barriers to Discharge MD  Medical stability, Incontinence, Neurogenic bowel and bladder, Behavior and ITB pump/spasticity/MS exacerbation  Nursing      PT Decreased caregiver support, Medical stability, Home environment access/layout    OT Decreased caregiver support, Home environment access/layout    SLP      SW       Team Discharge Planning: Destination: PT-Home ,OT- Home , SLP-  Projected Follow-up: PT-Home health PT, OT-  Home health OT, SLP-  Projected Equipment Needs: PT-To be determined, OT- None recommended by OT, SLP-  Equipment Details: PT-Pt owns rollator, further equipment TBD pending progress, OT-has BSC and shower chair Patient/family involved in discharge planning: PT- Patient,  OT-Patient, SLP-   MD ELOS: 10-12 days Medical Rehab Prognosis:  Excellent Assessment: Pt is a 48  yr old  female with MS s/p MS exacerbation s/p s/p IV steroids/Solumedrol with ITB pump for spasticity that needs to titrate up- will ask Dr Ollen Bowl if he wants to or we can; will get on scheduled bowel meds- might need neurogenic bowel program.  Goals Mod I    See Team Conference Notes for weekly updates to the plan of care

## 2019-04-07 ENCOUNTER — Inpatient Hospital Stay (HOSPITAL_COMMUNITY): Payer: BC Managed Care – PPO

## 2019-04-07 ENCOUNTER — Inpatient Hospital Stay (HOSPITAL_COMMUNITY): Payer: BC Managed Care – PPO | Admitting: Occupational Therapy

## 2019-04-07 ENCOUNTER — Encounter (HOSPITAL_COMMUNITY): Payer: BC Managed Care – PPO | Admitting: Psychology

## 2019-04-07 DIAGNOSIS — R269 Unspecified abnormalities of gait and mobility: Secondary | ICD-10-CM

## 2019-04-07 DIAGNOSIS — M62838 Other muscle spasm: Secondary | ICD-10-CM

## 2019-04-07 NOTE — Progress Notes (Signed)
Physical Therapy Session Note  Patient Details  Name: Sharon Odom MRN: [id number] Date of Birth: 1970-05-31  Today's Date: 04/07/2019 PT Individual Time: 1420-1535 PT Individual Time Calculation (min): 75 min   Short Term Goals: Week 1:  PT Short Term Goal 1 (Week 1): Pt will complete least restrictive transfers with CGA consistently PT Short Term Goal 2 (Week 1): Pt will ambulate x 50 ft with LRAD and min A PT Short Term Goal 3 (Week 1): Pt will initiate stair training  Skilled Therapeutic Interventions/Progress Updates:     Patient in bed upon PT arrival. Patient alert and agreeable to PT session. Patient denied pain throughout session. She reported feeling fatigued this afternoon and requested that she have PT in the morning rather than the afternoon, scheduler made aware.   Therapeutic Activity: Bed Mobility: Patient performed supine to/from sit with supervision using a leg lifter with increased time and HOB elevated when getting OOB. Provided verbal cues for not using bed rails and using of leg lifter for improved mechanics of lifting L LE. Transfers: Patient performed sit to/from stand x3, stand pivot x4, and a simulated car transfer the seat set to sedan height with min A-CGA using a RW. Provided verbal cues for reaching back to sit and hand placement on RW. PT demonstrated 2 different ways to get into a high truck with a step up to simulate getting into the patient's husband's vehicle. The patient reported that she will ask for measurements for the seat hight and step hight from the ground for accurate simulation during therapy. PT demonstrated a squat pivot transfer and educated on use of this type of transfer to conserve energy when fatigued. She attempted a squat pivot transfer x1, however, patient felt very uncomfortable since she was unfamiliar with this type of transfer and requested to try the transfer at a time she was less fatigued.   Gait Training:  Patient ambulated 18 feet  x2 using RW with min A-CGA. Ambulated with step-to gait pattern leading with R with alternating L knee in hyperextension or mild buckling in stance, L posteriorly rotated pelvis, significantly decreased hip and knee flexion in swing on L leading to minimal foot clearance, appearing more like patient dragging her L foot behind her. Provided verbal cues for increased step height of L foot, and increased quad activation on L in stance. Discussed patient's gait pattern and she stated that she is unable to lead with her L foot, stating "Its glued to the floor." She stated that she has walked like this for four years and that she has concerns about changing it now.   Wheelchair Mobility:  Patient was transported in the w/c with total A throughout session for energy conservation and time management.  Neuromuscular Re-ed: Patient performed Performed standing balance with weight shifts and manual facilitation of stepping with L foot forward 2x4. Required max-total A for initiating stepping each trial. Performed NuStep x1.5 min with setting on 5, 3, then 1 resistance, tapered down to tolerance, for 14 steps before 9/10 fatigue on modified RPE scale.   Patient in bed at end of session with breaks locked, bed alarm set, and all needs within reach.    Therapy Documentation Precautions:  Precautions Precautions: Fall Restrictions Weight Bearing Restrictions: No    Therapy/Group: Individual Therapy  Lorane Cousar L Lee-Anne Flicker PT, DPT  04/07/2019, 3:58 PM

## 2019-04-07 NOTE — Progress Notes (Signed)
Occupational Therapy Session Note  Patient Details  Name: Sharon Odom MRN: [id number] Date of Birth: [birth year]  Today's Date: 04/07/2019 OT Individual Time: 0930-1030 OT Individual Time Calculation (min): 60 min  and Today's Date: 04/07/2019 OT Missed Time: 15 Minutes Missed Time Reason: Nursing care   Short Term Goals: Week 1:  OT Short Term Goal 1 (Week 1): Pt will demo energy conservation techniques during LB dressing wiht no VC OT Short Term Goal 2 (Week 1): Pt will transfer to BSC/toilet wiht S consistently OT Short Term Goal 3 (Week 1): Pt will groom in standing wiht S for improved endurance  Skilled Therapeutic Interventions/Progress Updates:    Pt with RN using toilet upon arrival and missed 15 mins skilled OT services.  Pt prefers female staff to assist with ADL/toileting. Pt seated in w/c and engaged in LUE NMR/AAROM/AROM actiites with focus on proximal strength/stabilization for increased shoulder flexion/strength to facilitate washing hair and applying makeup. Pt stated MD increased dosage in Bacolfen pump and her LUE/hand was not "cramping" as much.  LUE exercises/activities demonstrated and pt return demonstrated.  Task/exercises to be performed sitting in w/c and when supine in bed. Pt fatigues quickly with extended rest breaks for recovery.  Pt transferred to bed with supervision and repositioned. Pt remained in bed with all needs within reach and bed alarm activated.   Therapy Documentation Precautions:  Precautions Precautions: Fall Restrictions Weight Bearing Restrictions: No General: General OT Amount of Missed Time: 15 Minutes Pain: Pt recently received pain medications and pt stated her pain was "ok"  Therapy/Group: Individual Therapy  Leroy Libman 04/07/2019, 10:32 AM

## 2019-04-07 NOTE — Progress Notes (Signed)
Occupational Therapy Session Note  Patient Details  Name: Sharon Odom MRN: [id number] Date of Birth: 1970/12/28  Today's Date: 04/07/2019 OT Individual Time: 2951-8841 OT Individual Time Calculation (min): 62 min    Short Term Goals: Week 1:  OT Short Term Goal 1 (Week 1): Pt will demo energy conservation techniques during LB dressing wiht no VC OT Short Term Goal 2 (Week 1): Pt will transfer to BSC/toilet wiht S consistently OT Short Term Goal 3 (Week 1): Pt will groom in standing wiht S for improved endurance  Skilled Therapeutic Interventions/Progress Updates:    Treatment session with focus on functional mobility, dynamic standing balance, and activity tolerance during self-care retraining tasks.  Pt received supine in bed agreeable to shower.  Pt completed bed mobility with CGA.  Sit > stand from elevated EOB with Min assist and RW.  Pt ambulated to toilet with RW with CGA.  Pt completed clothing management with close supervision and transferred to tub bench in room shower.  Pt completed bathing at sit > stand level with CGA when standing to wash perineal area and buttocks.  Pt demonstrating good sequencing and problem solving with use of figure 4 position during bathing and when donning underwear and pants.  Pt required increased time with dressing due to fatigue.  Pt reports typically taking an extended rest break after shower and before dressing at home.  CGA for standing balance during LB dressing, ultimately requiring assistance to pull pants over hips after 2 attempts due to fatigue with prolonged standing.  Pt left upright in w/c to complete grooming tasks at sink.  Therapy Documentation Precautions:  Precautions Precautions: Fall Restrictions Weight Bearing Restrictions: No Pain: Pain Assessment Pain Score: 5     Therapy/Group: Individual Therapy  Richy Spradley, [place] 04/07/2019, 10:02 AM

## 2019-04-07 NOTE — Patient Care Conference (Signed)
Inpatient RehabilitationTeam Conference and Plan of Care Update Date: 04/07/2019   Time: 11:40 AM    Patient Name: Sharon Odom      Medical Record Number: [id number]  Date of Birth: 1971-04-29 Sex: Female         Room/Bed: 4M09C/4M09C-01 Payor Info: Payor: [place] / Plan: BC[employer] COMM PPO / Product Type: *No Product type* /    Admit Date/Time:  04/03/2019  4:56 PM  Primary Diagnosis:  Multiple sclerosis exacerbation ([place])  Patient Active Problem List   Diagnosis Date Noted  . Asthma 04/02/2019  . Constipated 04/02/2019  . Spell of generalized weakness 03/31/2019  . Acute weakness 03/31/2019  . Presence of intrathecal baclofen pump 03/19/2019  . Dysphagia 04/14/2018  . Spastic diplegia, acquired, lower extremity ([place]) 10/16/2017  . Muscle spasticity 08/15/2017  . Gait disturbance 08/15/2017  . Depression with anxiety 08/15/2017  . Diplopia 08/15/2017  . Multiple sclerosis exacerbation ([place]) 03/04/2017    Expected Discharge Date: Expected Discharge Date: 04/16/19  Team Members Present: Physician leading conference: Dr. Courtney Heys Social Worker Present: Lennart Pall, LCSW Nurse Present: Benjie Karvonen, RN Case Manager: Karene Fry, RN PT Present: Apolinar Junes, PT OT Present: Willeen Cass, OT;Roanna Epley, COTA SLP Present: Jettie Booze, CF-SLP PPS Coordinator present : Gunnar Fusi, SLP     Current Status/Progress Goal Weekly Team Focus  Bowel/Bladder   Continent of Bladder and Bowel, LBM 04/06/19  Remain Continent of B&B  Assess Bowel and Bladder q shift and as needed   Swallow/Nutrition/ Hydration             ADL's   bathing/dressing-min A with mod A for donning socks; functional tranfsers-CGA/min A; toileting-min A  mod I overall  activity tolerance, safety awareness, BADL training, functional transfers   Mobility   Min A bed mobility with leg lifter, min to mod A sit to stand with RW, gait x 22' with RW mod A, attempted using rollator determined  unsafe at this time  Mod I overall, setup A car transfer, min A stairs  Transfer training, gait training, stair training when able, strengthening, w/c mobility, and patient/caregiver education   Communication             Safety/Cognition/ Behavioral Observations            Pain   C/o rt leg pain, controlled with prn Oxycodone 10mg  prn  Pain < or =3  Assess pain Q shift and prn   Skin   No skin Breakdown  Remain free of infections, wounds and skin breakdown  Assess skin Qshift and prn and preven skin breakdown and infection    Rehab Goals Patient on target to meet rehab goals: Yes *See Care Plan and progress notes for long and short-term goals.     Barriers to Discharge  Current Status/Progress Possible Resolutions Date Resolved   Nursing                  PT  Home environment access/layout;Decreased caregiver support  5 STE with intermittent assist from husband  Husband may be able to build a ramp, unsure if it would be completed by d/c           OT                  SLP                SW  Discharge Planning/Teaching Needs:  Home with spouse who can provide intermittent support.      Team Discussion: MS exacerbation, L hemi, baclophen pump for spasticity, nerve pain, started keppra, hoarse voice new, stiffness better.  RN - cont B/B, constipated, senokot started by MD, on miralax, ?may need bowel program.  OT mod I goals, showered S/CGA, fatigues quickly, transfers S/CGA, making progress.  PT mod I goals, set up car.  Transfers min/CGA, mod A with fatigue, amb 22' RW, ?need for ramp mentioned, :need for new W/C mentioned.   Revisions to Treatment Plan: N/A     Medical Summary Current Status: finally unblocked of stool after 6+ days; neurogenic bowel; spasticity with ITB pump placed this summer Weekly Focus/Goal: improve strength on L side; work on nerve pain- started Keppra; might need bowel program  Barriers to Discharge: Other (comments);Decreased  family/caregiver support;Home enviroment access/layout;Neurogenic Bowel & Bladder  Barriers to Discharge Comments: L hemiparesis due to MS flare; ITB pump being titrated up Possible Resolutions to Barriers: ITB pump titration   Continued Need for Acute Rehabilitation Level of Care: The patient requires daily medical management by a physician with specialized training in physical medicine and rehabilitation for the following reasons: Direction of a multidisciplinary physical rehabilitation program to maximize functional independence : Yes Medical management of patient stability for increased activity during participation in an intensive rehabilitation regime.: Yes Analysis of laboratory values and/or radiology reports with any subsequent need for medication adjustment and/or medical intervention. : Yes   I attest that I was present, lead the team conference, and concur with the assessment and plan of the team.   Trish Mage 04/08/2019, 3:03 PM  Team conference was held via web/ teleconference due to COVID - 19

## 2019-04-07 NOTE — Progress Notes (Signed)
[facility] PHYSICAL MEDICINE & REHABILITATION PROGRESS NOTE   Subjective/Complaints:  Pt reports they increased her ITB pump 15%, but didn't document exact mcg of ITB pump in note, so I don't know what dose it was at, or went to.  She reports she got a shower this AM; feeling pretty good; stiffness better; also notes hoarse voice is new.   Nerve pain the same- just started Keppra.  ROS-denies SOB; denies CP, SOB, N/V/D  Objective:   No results found. Recent Labs    04/06/19 0628  WBC 8.8  HGB 15.6*  HCT 47.5*  PLT 273   Recent Labs    04/06/19 0628  NA 137  K 4.2  CL 103  CO2 24  GLUCOSE 119*  BUN 15  CREATININE 0.91  CALCIUM 9.1    Intake/Output Summary (Last 24 hours) at 04/07/2019 0856 Last data filed at 04/07/2019 0811 Gross per 24 hour  Intake 680 ml  Output -  Net 680 ml     Physical Exam: Vital Signs Blood pressure 103/75, pulse 63, temperature 97.9 F (36.6 C), resp. rate 18, height 5\' 9"  (1.753 m), weight 92.3 kg, SpO2 95 %.   General: No acute distress Sitting up in manual w/c at bedside; OT at side; just out of shower; covered NAD Mood and affect are appropriate; slightly flat Heart: Regular rate and rhythm  Lungs: Clear to auscultation, B/L Abdomen: (+) BS, soft, ITB pump in abdomen; ND, NT Extremities: No clubbing, cyanosis, or edema- although pt says legs feel swollen; didn't look sign edematous Skin: No evidence of breakdown, no evidence of rash Neurologic: Cranial nerves II through XII intact, motor strength is 5/5 in RIght  4/5 Left  deltoid, bicep, tricep, grip,4- RIght and 3- left  hip flexor, knee extensors, ankle dorsiflexor and plantar flexor Sensory exam reduced to light touch and proprioceptionleft lower lower extremity Ext - C/C/E Musculoskeletal: Full range of motion in all 4 extremities. No joint swelling MAS of 2-3 in LLE and 1+ in LUE  Assessment/Plan: 1. Functional deficits secondary to MS exacerbation  which require 3+  hours per day of interdisciplinary therapy in a comprehensive inpatient rehab setting.  Physiatrist is providing close team supervision and 24 hour management of active medical problems listed below.  Physiatrist and rehab team continue to assess barriers to discharge/monitor patient progress toward functional and medical goals  Care Tool:  Bathing    Body parts bathed by patient: Right arm, Left arm, Chest, Abdomen, Front perineal area, Buttocks, Right upper leg, Left upper leg, Right lower leg, Left lower leg, Face         Bathing assist Assist Level: Contact Guard/Touching assist     Upper Body Dressing/Undressing Upper body dressing   What is the patient wearing?: Bra, Pull over shirt    Upper body assist Assist Level: Supervision/Verbal cueing    Lower Body Dressing/Undressing Lower body dressing      What is the patient wearing?: Underwear/pull up, Pants     Lower body assist Assist for lower body dressing: Minimal Assistance - Patient > 75%     Toileting Toileting    Toileting assist Assist for toileting: Contact Guard/Touching assist     Transfers Chair/bed transfer  Transfers assist     Chair/bed transfer assist level: Minimal Assistance - Patient > 75% Chair/bed transfer assistive device: Programmer, multimedia   Ambulation assist      Assist level: Minimal Assistance - Patient > 75% Assistive device: Walker-rolling Max  distance: 22 feet   Walk 10 feet activity   Assist     Assist level: Minimal Assistance - Patient > 75% Assistive device: Walker-rolling   Walk 50 feet activity   Assist Walk 50 feet with 2 turns activity did not occur: Safety/medical concerns         Walk 150 feet activity   Assist Walk 150 feet activity did not occur: Safety/medical concerns         Walk 10 feet on uneven surface  activity   Assist Walk 10 feet on uneven surfaces activity did not occur: Safety/medical concerns          Wheelchair     Assist Will patient use wheelchair at discharge?: (TBD)   Wheelchair activity did not occur: Safety/medical concerns         Wheelchair 50 feet with 2 turns activity    Assist    Wheelchair 50 feet with 2 turns activity did not occur: Safety/medical concerns       Wheelchair 150 feet activity     Assist  Wheelchair 150 feet activity did not occur: Safety/medical concerns       Blood pressure 103/75, pulse 63, temperature 97.9 F (36.6 C), resp. rate 18, height 5\' 9"  (1.753 m), weight 92.3 kg, SpO2 95 %.  Medical Problem List and Plan: 1.  Decreased functional ability with increasing muscle weakness spasms and blurred vision secondary to MS exacerbation.  3 days IV Solu-Medrol completed.  Followed by Dr. neurology services outpatient maintained on [place] as welll as dalfampridine             -CIR  PT and OT evals today 2.  Antithrombotics: -DVT/anticoagulation: Lovenox.  Check vascular study             -antiplatelet therapy: N/A 3. Pain Management/spasticity: Recent insertion of intrathecal baclofen pump 02/18/2019 for spasticity, baclofen 20 mg 3 times daily, Cymbalta 30 mg twice daily, Valium 5 mg every 8 hours as needed, Advil as needed as well as oxycodone Should be able to wean oral  antispasticity agents over time but may need dosage change on pump, no evidence of spasticity this am  Pain does not appear to be spasticity related but instead neurogenic 11/9- Will call Dr 13/9 to increase ITB/pump- can increase myself, but don't want to step on toes; for nerve pain, will try Keppra 500 mg BID 11/10- ITB pump increased 15%- don't know previous or current dose based on note.  4. Mood: Wellbutrin 300 mg daily, Cymbalta 20mg  BID             -antipsychotic agents: N/A 5. Neuropsych: This patient is capable of making decisions on her own behalf. 6. Skin/Wound Care: Routine skin checks 7. Fluids/Electrolytes/Nutrition: Routine in and outs  with follow-up chemistries 8.  Asthma with history of tobacco abuse.  Continue nebulizers as needed as well as scheduled Singulair. 9.  GERD.  Pepcid 10. Neurogenic bowel with constipation- due to MS and ITB pump- will con't Miralax and add Senokot-S 2 tabs qAM Disposition: Upon discharge from acute rehabilitation, Mrs. Laube should have outpatient follow-up with PCP, physiatry, neurology (Dr. ), and neurosurgery (Dr. Herold Harms).     LOS: 4 days A FACE TO FACE EVALUATION WAS PERFORMED  Daneen Volcy 04/07/2019, 8:56 AM

## 2019-04-08 ENCOUNTER — Inpatient Hospital Stay (HOSPITAL_COMMUNITY): Payer: BC Managed Care – PPO

## 2019-04-08 NOTE — Consult Note (Signed)
Neuropsychological Consultation   Patient:   Sharon Odom   DOB:   July 26, 1970  MR Number:  [id number]  Location:  [facility] [address]ENTER [address] [id number] [place] [address] Dept: [phone] Loc: [phone]           Date of Service:   04/07/2019  Start Time:   3 PM End Time:   4 PM  Provider/Observer:  Sharon Odom, Psy.D.       Clinical Neuropsychologist       Billing Code/Service: [phone]  Chief Complaint:    Sharon Odom is a 48 year old female with history of asthma, prior tobacco abuse, anxiety/depression as well as MS diagnosed 2013 followed outpatient and maintained on [place] and dalfampridine.  Patient has baclofen pump due to increased spasticity from MS.  Patient has prior history of depression and anxiety.  Presented on 03/31/2019 with increased lower extremity weakness, dysphagia, muscle spasm, blurred vision and worsening of chronic left hand weakness.  Patient reports that last significant MS flare was more than two years ago.  Reason for Service:  Patient was HPI: Sharon Odom is a 48 year old right-handed female with history of asthma with tobacco abuse, anxiety/depression as well as multiple sclerosis diagnosed 2013 followed outpatient by neurology services Dr. Epimenio Foot maintained on [place] as well as dalfampridine.  Patient with recent placement of baclofen pump 02/18/2019 per Dr. Ollen Bowl for increasing spasticity related to her multiple sclerosis.  Per chart review patient lives with spouse.  Two-level home with bed and bath on main level and 5 steps to entry.  Independent with assistive device prior to admission.  Presented 03/31/2019 with increasing lower extremity weakness, dysphagia, muscle spasms, blurred vision and worsening of chronic left hand weakness.  She did recently see her ophthalmologist for blurred vision as report no acute findings noted.  Admission chemistries unremarkable, WBC  11,000, SARS Covid negative.  MRI of the brain, cervical and thoracic spine with and without contrast obtained revealed multiple chronic demyelinating lesions without acute enhancement.  Neurology follow-up it was advised to begin IV Solu-Medrol x3 doses 03/31/2019 for suspected MS exacerbation.  Subcutaneous Lovenox added for DVT prophylaxis.  Speech therapy evaluation for dysphagia diet has steadily been advanced now on a regular consistency.  Therapy evaluations completed and patient was admitted for a comprehensive rehab program.  Current Status:  Patient reports mood is not significantly depressed or anxiety.  She reports that her vision seems to have improved recently and she feels like her MS flare is getting under control.  The patient reports that she is working hard in therapies and feels like she is getting stronger.   Behavioral Observation: Sharon Odom  presents as a 48 y.o.-year-old Right Caucasian Female who appeared her stated age. her dress was Appropriate and she was Well Groomed and her manners were Appropriate to the situation.  her participation was indicative of Appropriate and Attentive behaviors.  There were any physical disabilities noted.  she displayed an appropriate level of cooperation and motivation.     Interactions:    Active Appropriate, Attentive and Drowsy  Attention:   within normal limits and attention span and concentration were age appropriate  Memory:   within normal limits; recent and remote memory intact  Visuo-spatial:  not examined  Speech (Volume):  low  Speech:   normal; normal  Thought Process:  Coherent and Relevant  Though Content:  WNL; not suicidal and not homicidal  Orientation:  person, place, time/date and situation  Judgment:   Good  Planning:   Good  Affect:    Appropriate  Mood:    Dysphoric  Insight:   Good  Intelligence:   normal   Medical History:   Past Medical History:  Diagnosis Date  . Allergies   . Anxiety    . Arthritis   . Asthma   . Constipation   . Depression   . GERD (gastroesophageal reflux disease)   . Headache   . History of hiatal hernia   . Multiple sclerosis ([place])   . Vision abnormalities   . Wears glasses    Psychiatric History:  History of depression and anxiety.  Family Med/Psych History:  Family History  Problem Relation Age of Onset  . Diabetes Mother   . Healthy Brother     Impression/DX:  Sharon Odom is a 48 year old female with history of asthma, prior tobacco abuse, anxiety/depression as well as MS diagnosed 2013 followed outpatient and maintained on Lemtrada and dalfampridine.  Patient has baclofen pump due to increased spasticity from MS.  Patient has prior history of depression and anxiety.  Presented on 03/31/2019 with increased lower extremity weakness, dysphagia, muscle spasm, blurred vision and worsening of chronic left hand weakness.  Patient reports that last significant MS flare was more than two years ago.  Patient reports mood is not significantly depressed or anxiety.  She reports that her vision seems to have improved recently and she feels like her MS flare is getting under control.  The patient reports that she is working hard in therapies and feels like she is getting stronger.  Disposition/Plan:  Will follow up with the patient if she is still here next week.  Diagnosis:    Multiple sclerosis exacerbation ([place]) - Plan: Ambulatory referral to ENT         Electronically Signed   _______________________ Ilean Skill, Psy.D.

## 2019-04-08 NOTE — Progress Notes (Signed)
Occupational Therapy Session Note  Patient Details  Name: Sharon Odom MRN: [id number] Date of Birth: 1971/01/20  Today's Date: 04/08/2019 OT Individual Time: 0254-2706 OT Individual Time Calculation (min): 60 min    Short Term Goals: Week 1:  OT Short Term Goal 1 (Week 1): Pt will demo energy conservation techniques during LB dressing wiht no VC OT Short Term Goal 2 (Week 1): Pt will transfer to BSC/toilet wiht S consistently OT Short Term Goal 3 (Week 1): Pt will groom in standing wiht S for improved endurance  Skilled Therapeutic Interventions/Progress Updates:    1:1. Pt received in bed reporting need to toilet after talking with MD during rounds. Pt ambulates to/from bathroom with MIN A for advancement of LLE and CGA for balance. Increased weightshift R d/t compensation for circumduction of LLE. Pt able to void baldder seated on toilet and loses LOB to R with CGA to recover d/t delayed reaction time with RUE on grab bar. Pt dresses sit to stand from toilet in bathroom with CGA for standing balance during LB dressing. Pt grooms at sink with set up and increased time d/t several hair and skin rpoducts required for routine. OT also discusses shower set up and step over transfers required with currents set up at d/t (about to be remodeled in near future to roll in shower). Pt agreeable to attempt transfer this afternoon. Exiteds essionw ith pt seated in bed, exit alarm on and call light tinreac  Therapy Documentation Precautions:  Precautions Precautions: Fall Restrictions Weight Bearing Restrictions: No General:   Vital Signs: Therapy Vitals Temp: 97.8 F (36.6 C) Temp Source: Oral Pulse Rate: 66 Resp: 18 BP: 104/70 Patient Position (if appropriate): Lying Oxygen Therapy SpO2: 96 % O2 Device: Room Air Pain:   ADL:   Vision   Perception    Praxis   Exercises:   Other Treatments:     Therapy/Group: Individual Therapy  Tonny Branch 04/08/2019, 8:32  AM

## 2019-04-08 NOTE — Progress Notes (Signed)
[place] PHYSICAL MEDICINE & REHABILITATION PROGRESS NOTE   Subjective/Complaints:  Pt reports stiffness still doing well- LBM Sunday- still no BM since made with meds to have BM- discussed bowel program if she can't go today- placed order.  Nerve pain the same- still  ROS-denies SOB; denies CP, SOB, N/V/D  Objective:   No results found. Recent Labs    04/06/19 0628  WBC 8.8  HGB 15.6*  HCT 47.5*  PLT 273   Recent Labs    04/06/19 0628  NA 137  K 4.2  CL 103  CO2 24  GLUCOSE 119*  BUN 15  CREATININE 0.91  CALCIUM 9.1    Intake/Output Summary (Last 24 hours) at 04/08/2019 0839 Last data filed at 04/08/2019 0750 Gross per 24 hour  Intake 716 ml  Output -  Net 716 ml     Physical Exam: Vital Signs Blood pressure 104/70, pulse 66, temperature 97.8 F (36.6 C), temperature source Oral, resp. rate 18, height 5\' 9"  (1.753 m), weight 92.3 kg, SpO2 96 %.   General: No acute distress Sitting up in bed; watching TV, appropriate, hoarse voice again today NAD Heart: Regular rate and rhythm  Lungs: Clear to auscultation, B/L Abdomen: (+) BS, soft, ITB pump in abdomen; ND, NT Extremities: No clubbing, cyanosis, or edema- although pt says legs feel swollen; didn't look sign edematous Skin: No evidence of breakdown, no evidence of rash Neurologic: Cranial nerves II through XII intact, motor strength is 5/5 in RIght  4/5 Left  deltoid, bicep, tricep, grip,4- RIght and 3- left  hip flexor, knee extensors, ankle dorsiflexor and plantar flexor Sensory exam reduced to light touch and proprioceptionleft lower lower extremity Ext - C/C/E Musculoskeletal: Full range of motion in all 4 extremities. No joint swelling MAS of 2-3 in LLE and 1+ in LUE  Assessment/Plan: 1. Functional deficits secondary to MS exacerbation  which require 3+ hours per day of interdisciplinary therapy in a comprehensive inpatient rehab setting.  Physiatrist is providing close team supervision and 24  hour management of active medical problems listed below.  Physiatrist and rehab team continue to assess barriers to discharge/monitor patient progress toward functional and medical goals  Care Tool:  Bathing    Body parts bathed by patient: Right arm, Left arm, Chest, Abdomen, Front perineal area, Buttocks, Right upper leg, Left upper leg, Right lower leg, Left lower leg, Face         Bathing assist Assist Level: Contact Guard/Touching assist     Upper Body Dressing/Undressing Upper body dressing   What is the patient wearing?: Bra, Pull over shirt    Upper body assist Assist Level: Supervision/Verbal cueing    Lower Body Dressing/Undressing Lower body dressing      What is the patient wearing?: Underwear/pull up, Pants     Lower body assist Assist for lower body dressing: Minimal Assistance - Patient > 75%     Toileting Toileting    Toileting assist Assist for toileting: Contact Guard/Touching assist     Transfers Chair/bed transfer  Transfers assist     Chair/bed transfer assist level: Minimal Assistance - Patient > 75% Chair/bed transfer assistive device:   Ambulation assist      Assist level: Minimal Assistance - Patient > 75% Assistive device: Walker-rolling Max distance: 18'   Walk 10 feet activity   Assist     Assist level: Minimal Assistance - Patient > 75% Assistive device: Walker-rolling   Walk 50 feet activity  Assist Walk 50 feet with 2 turns activity did not occur: Safety/medical concerns         Walk 150 feet activity   Assist Walk 150 feet activity did not occur: Safety/medical concerns         Walk 10 feet on uneven surface  activity   Assist Walk 10 feet on uneven surfaces activity did not occur: Safety/medical concerns         Wheelchair     Assist Will patient use wheelchair at discharge?: (TBD)   Wheelchair activity did not occur: Safety/medical concerns          Wheelchair 50 feet with 2 turns activity    Assist    Wheelchair 50 feet with 2 turns activity did not occur: Safety/medical concerns       Wheelchair 150 feet activity     Assist  Wheelchair 150 feet activity did not occur: Safety/medical concerns       Blood pressure 104/70, pulse 66, temperature 97.8 F (36.6 C), temperature source Oral, resp. rate 18, height 5\' 9"  (1.753 m), weight 92.3 kg, SpO2 96 %.  Medical Problem List and Plan: 1.  Decreased functional ability with increasing muscle weakness spasms and blurred vision secondary to MS exacerbation.  3 days IV Solu-Medrol completed.  Followed by Dr. Felecia Shelling neurology services outpatient maintained on [employer] as welll as dalfampridine             -CIR  PT and OT evals today 2.  Antithrombotics: -DVT/anticoagulation: Lovenox.  Check vascular study             -antiplatelet therapy: N/A 3. Pain Management/spasticity: Recent insertion of intrathecal baclofen pump 02/18/2019 for spasticity, baclofen 20 mg 3 times daily, Cymbalta 30 mg twice daily, Valium 5 mg every 8 hours as needed, Advil as needed as well as oxycodone Should be able to wean oral  antispasticity agents over time but may need dosage change on pump, no evidence of spasticity this am  Pain does not appear to be spasticity related but instead neurogenic 11/9- Will call Dr Maryjean Ka to increase ITB/pump- can increase myself, but don't want to step on toes; for nerve pain, will try Keppra 500 mg BID 11/10- ITB pump increased 15%- don't know previous or current dose based on note.  4. Mood: Wellbutrin 300 mg daily, Cymbalta 20mg  BID             -antipsychotic agents: N/A 5. Neuropsych: This patient is capable of making decisions on her own behalf. 6. Skin/Wound Care: Routine skin checks 7. Fluids/Electrolytes/Nutrition: Routine in and outs with follow-up chemistries 8.  Asthma with history of tobacco abuse.  Continue nebulizers as needed as well as scheduled  Singulair. 9.  GERD.  Pepcid 10. Neurogenic bowel with constipation- due to MS and ITB pump- will con't Miralax and add Senokot-S 2 tabs qAM  11/11- will add bowel program tonight after supper- if doesn't go today. Disposition: Upon discharge from acute rehabilitation, Mrs. Bartolome should have outpatient follow-up with PCP, physiatry, neurology (Dr. Felecia Shelling), and neurosurgery (Dr. Maryjean Ka).   11/11 will also have ENT f/u for dysphonia   LOS: 5 days A FACE TO FACE EVALUATION WAS PERFORMED  Chidiebere Wynn 04/08/2019, 8:39 AM

## 2019-04-08 NOTE — Progress Notes (Signed)
Physical Therapy Session Note  Patient Details  Name: Sharon Odom MRN: [id number] Date of Birth: 1971/01/07  Today's Date: 04/08/2019 PT Individual Time: 1005-1105 PT Individual Time Calculation (min): 60 min   Short Term Goals: Week 1:  PT Short Term Goal 1 (Week 1): Pt will complete least restrictive transfers with CGA consistently PT Short Term Goal 2 (Week 1): Pt will ambulate x 50 ft with LRAD and min A PT Short Term Goal 3 (Week 1): Pt will initiate stair training  Skilled Therapeutic Interventions/Progress Updates:     Patient in bed upon PT arrival. Patient alert and agreeable to PT session. Patient denied pain during session. She reported that she slept poorly last night causing increased fatigue this morning. Karlene Einstein, RN present for session for transfer training orientation.  Therapeutic Activity: Bed Mobility: Patient performed supine to/from sit with supervision with HOB elevated and leg lifter for L LE, as patient has elevating HOB at home. Provided verbal cues for placement of leg lifter lower on her foot for easier use and to prevent sliding/slipping. Transfers: Patient performed sit to/from stand x1 and stand pivot x3, bed>w/c, w/c>BSC, BSC>bed, with CGA. Provided verbal cues for hand placement on RW and reaching back to sit. Patient required supervision for peri-care and LB dressing during toileting with CGA for balance in standing to pull up pants.  Gait Training:  Patient ambulated 45 feet using RW with Bioness system on L LE, calf unit only, with CGA. Ambulated with decreased knee buckling on L and improved L foot clearance in swing, continues to perform step-to with intermittent step-through when L foot clears leading with her R with pelvis rotated posteriorly on the L due to L foot drag. Provided verbal cues for erect posture and increased quad and gluteal activation in stance on R for improved L foot clearance.   Wheelchair Mobility:  Patient propelled wheelchair 155  feet with supervision using R UE and LE hemi-technique. Provided verbal cues for hemi-technique, steering, and turning technique.  Patient in bed at end of session with breaks locked, bed alarm set, and all needs within reach.    Therapy Documentation Precautions:  Precautions Precautions: Fall Restrictions Weight Bearing Restrictions: No   Therapy/Group: Individual Therapy  Laakea Pereira L Thalya Fouche PT, DPT  04/08/2019, 4:10 PM

## 2019-04-08 NOTE — Progress Notes (Signed)
Occupational Therapy Session Note  Patient Details  Name: Sharon Odom MRN: [id number] Date of Birth: Apr 20, 1971  Today's Date: 04/08/2019 OT Individual Time: 1345-1430 OT Individual Time Calculation (min): 45 min    Short Term Goals: Week 1:  OT Short Term Goal 1 (Week 1): Pt will demo energy conservation techniques during LB dressing wiht no VC OT Short Term Goal 2 (Week 1): Pt will transfer to BSC/toilet wiht S consistently OT Short Term Goal 3 (Week 1): Pt will groom in standing wiht S for improved endurance  Skilled Therapeutic Interventions/Progress Updates:    Pt resting in bed upon arrival and agreeable to participating in therapy.  OT intervention with focus on bed mobility, sitting balance, sit<>stand, standing balance, side stepping, and LUE gross motor and [place] activities to increase function, activity tolerance, and functional strength with LUE. Supervision for all bed mobiity and supine<>sit with leg lifter. LUE tasks included removing beads from soft theraputty, in hand manipulation with poker chips, stacking poker chips, and placing/removing yellow and blue clothes pins from container. Pt commented that she was "working up a sweat" at end of session.  Pt sit<>stand from EOB and side stepped to [facility] before returning to supine.  Pt remained in bed with all needs within reach and bed alarm activated.   Therapy Documentation Precautions:  Precautions Precautions: Fall Restrictions Weight Bearing Restrictions: No   Pain: Pain Assessment Pain Scale: 0-10 Pain Score: 7  Pain Type: Neuropathic pain Pain Location: Leg Pain Orientation: Right;Left Pain Radiating Towards: bil feet Pain Descriptors / Indicators: Aching;Burning;Discomfort Pain Frequency: Constant Pain Onset: On-going Pain Intervention(s): Repositioned;Heat applied   Therapy/Group: Individual Therapy  Leroy Libman 04/08/2019, 2:38 PM

## 2019-04-08 NOTE — Progress Notes (Signed)
Occupational Therapy Session Note  Patient Details  Name: Sharon Odom MRN: [id number] Date of Birth: 1970/05/30  Today's Date: 04/08/2019 OT Individual Time: 1533-1600 OT Individual Time Calculation (min): 27 min    Short Term Goals: Week 1:  OT Short Term Goal 1 (Week 1): Pt will demo energy conservation techniques during LB dressing wiht no VC OT Short Term Goal 2 (Week 1): Pt will transfer to BSC/toilet wiht S consistently OT Short Term Goal 3 (Week 1): Pt will groom in standing wiht S for improved endurance  Skilled Therapeutic Interventions/Progress Updates:    1:1. Pt received in bed agreeable to OT. Pt completes bed mobility with hospital bed features with S. Pt stand pivot  Transfer to w/c with RW and MIN A. Pt and OT discuss shower set up with built in bench and curtain. Pt completes step in shower transfer using RW to simulate grab bars and lateral step movement with MIN A for balance and A to manage LLE over shower ledge. Exited session with pt seated in w/c, call light in reach and all need smet.  Therapy Documentation Precautions:  Precautions Precautions: Fall Restrictions Weight Bearing Restrictions: No General:   Vital Signs: Therapy Vitals Pulse Rate: 73 Resp: 18 BP: 104/68 Patient Position (if appropriate): Sitting Oxygen Therapy SpO2: 93 % O2 Device: Room Air Pain: Pain Assessment Pain Score: 4  ADL:   Vision   Perception    Praxis   Exercises:   Other Treatments:     Therapy/Group: Individual Therapy  Tonny Branch 04/08/2019, 4:08 PM

## 2019-04-09 ENCOUNTER — Ambulatory Visit (HOSPITAL_COMMUNITY): Payer: BC Managed Care – PPO

## 2019-04-09 ENCOUNTER — Inpatient Hospital Stay (HOSPITAL_COMMUNITY): Payer: BC Managed Care – PPO | Admitting: Occupational Therapy

## 2019-04-09 ENCOUNTER — Inpatient Hospital Stay (HOSPITAL_COMMUNITY): Payer: BC Managed Care – PPO

## 2019-04-09 NOTE — Progress Notes (Signed)
Occupational Therapy Session Note  Patient Details  Name: Sharon Odom MRN: [id number] Date of Birth: August 23, 1970  Today's Date: 04/09/2019 OT Individual Time: 1300-1345 OT Individual Time Calculation (min): 45 min    Short Term Goals: Week 1:  OT Short Term Goal 1 (Week 1): Pt will demo energy conservation techniques during LB dressing wiht no VC OT Short Term Goal 2 (Week 1): Pt will transfer to BSC/toilet wiht S consistently OT Short Term Goal 3 (Week 1): Pt will groom in standing wiht S for improved endurance  Skilled Therapeutic Interventions/Progress Updates:    Pt seen for OT session focusing on functional mobility and education. Pt in supine upon arrival, voicing fatigue from previous sessions but willing to participate as able. Reports being pre-medicated prior to tx session for LE pain.  She transferred to sitting EOB with supervision using hospital bed functions and leg lifter for L LE.  Ambulated short distances throughout session with min A using RW heavy reliance on UEs when attempting to advance L LE.  Pt taken to ADL apartment total A for time and energy conservation.  Practiced kitchen mobility, obtaining and replacing items in overhead cabinets and moving them to opposite kitchen cabinet. Completed with CGA, pt able to self initiate need for seated rest break during task. Pt reports she will use rollator at d/c. Reports she has practiced with rollator once since admission and "it didn't go well", however, she cont to report she will use rollator at d/c in order to use seat capability of rollator. Competed planned failure ambulation with shopping cart as pt relies heavily on UEs for mobility. Completed with min-mod A, required assist to maintain control of shopping cart. Will follow up with pt's primary PT.  Pt returned to room at end of session, min A to return to supine. Pt left in supine with all needs in reach, bed alarm on and positioned with pillows for comfort.    Therapy Documentation Precautions:  Precautions Precautions: Fall Restrictions Weight Bearing Restrictions: No   Therapy/Group: Individual Therapy  Pearlee Arvizu L 04/09/2019, 7:11 AM

## 2019-04-09 NOTE — Evaluation (Signed)
Recreational Therapy Assessment and Plan  Patient Details  Name: Sharon Odom MRN: [id number] Date of Birth: Apr 18, 1971 Today's Date: 04/09/2019  Rehab Potential: ELOS: 10-12 days  Assessment Problem List:      Patient Active Problem List   Diagnosis Date Noted  . Asthma 04/02/2019  . Constipated 04/02/2019  . Spell of generalized weakness 03/31/2019  . Acute weakness 03/31/2019  . Presence of intrathecal baclofen pump 03/19/2019  . Dysphagia 04/14/2018  . Spastic diplegia, acquired, lower extremity ([place]) 10/16/2017  . Muscle spasticity 08/15/2017  . Gait disturbance 08/15/2017  . Depression with anxiety 08/15/2017  . Diplopia 08/15/2017  . Multiple sclerosis exacerbation ([place]) 03/04/2017    Past Medical History:      Past Medical History:  Diagnosis Date  . Allergies   . Anxiety   . Arthritis   . Asthma   . Constipation   . Depression   . GERD (gastroesophageal reflux disease)   . Headache   . History of hiatal hernia   . Multiple sclerosis ([place])   . Vision abnormalities   . Wears glasses    Past Surgical History:       Past Surgical History:  Procedure Laterality Date  . EXCISION MORTON'S NEUROMA Right   . fallopian tube removal    . INTRATHECAL PUMP IMPLANT Right 02/18/2019   Procedure: INTRATHECAL PUMP IMPLANT;  Surgeon: Clydell Hakim, MD;  Location: [facility];  Service: Neurosurgery;  Laterality: Right;  INTRATHECAL PUMP IMPLANT  . WISDOM TOOTH EXTRACTION      Assessment & Plan Clinical Impression:  Sharon Odom is a 48 year old right-handed female with history of asthma with tobacco abuse, anxiety/depression as well as multiple sclerosis diagnosed 2013 followed outpatient by neurology services Dr. Felecia Shelling maintained on [place] as well as dalfampridine. Patient with recent placement of baclofen pump 02/18/2019 per Dr. Maryjean Ka for increasing spasticity related to her multiple sclerosis. Per chart review patient lives with spouse.  Two-level home with bed and bath on main level and 5 steps to entry. Independent with assistive device prior to admission. Presented 03/31/2019 with increasing lower extremity weakness, dysphagia, muscle spasms, blurred vision and worsening of chronic left hand weakness. She did recently see her ophthalmologist for blurred vision as report no acute findings noted. Admission chemistries unremarkable, WBC 11,000, SARS Covid negative. MRI of the brain, cervical and thoracic spine with and without contrast obtained revealed multiple chronic demyelinating lesions without acute enhancement. Neurology follow-up it was advised to begin IV Solu-Medrol x3 doses 03/31/2019 for suspected MS exacerbation. Subcutaneous Lovenox added for DVT prophylaxis. Speech therapy evaluation for dysphagia diet has steadily been advanced now on a regular consistency. Therapy evaluations completed and patient was admitted for a comprehensive rehab program. Patient transferred to CIR on 04/03/2019.  Pt presents with decreased activity tolerance, decreased functional mobility, decreased balance Limiting pt's independence with leisure/community pursuits.   Plan Min 1 TR session >20 minutes during LOS  Recommendations for other services: None   Discharge Criteria: Patient will be discharged from TR if patient refuses treatment 3 consecutive times without medical reason.  If treatment goals not met, if there is a change in medical status, if patient makes no progress towards goals or if patient is discharged from hospital.  The above assessment, treatment plan, treatment alternatives and goals were discussed and mutually agreed upon: by patient  [place] 04/09/2019, 9:24 AM

## 2019-04-09 NOTE — Progress Notes (Signed)
[facility] PHYSICAL MEDICINE & REHABILITATION PROGRESS NOTE   Subjective/Complaints:  Pt reports that she had suppository but denied that received dig stim.  Did have BM. Nerve pain is the same, but moving around better overall.  ROS-denies SOB; denies CP, SOB, N/V/D  Objective:   No results found. No results for input(s): WBC, HGB, HCT, PLT in the last 72 hours. No results for input(s): NA, K, CL, CO2, GLUCOSE, BUN, CREATININE, CALCIUM in the last 72 hours.  Intake/Output Summary (Last 24 hours) at 04/09/2019 0856 Last data filed at 04/09/2019 0848 Gross per 24 hour  Intake 600 ml  Output -  Net 600 ml     Physical Exam: Vital Signs Blood pressure 104/69, pulse 65, temperature 98.2 F (36.8 C), temperature source Oral, resp. rate 18, height 5\' 9"  (1.753 m), weight 92.3 kg, SpO2 93 %.   General: No acute distress In shower; with OT at side;  hoarse voice -chronic, but slightly better today; NAD Heart: Regular rate and rhythm  Lungs: Clear to auscultation, B/L Abdomen: (+) BS, soft, ITB pump in abdomen; ND, NT Extremities: No clubbing, cyanosis, or edema- although pt says legs feel swollen; didn't look sign edematous still Skin: No evidence of breakdown, no evidence of rash Neurologic: Cranial nerves II through XII intact, motor strength is 5/5 in RIght  4/5 Left  deltoid, bicep, tricep, grip,4- RIght and 3- left  hip flexor, knee extensors, ankle dorsiflexor and plantar flexor Sensory exam reduced to light touch and proprioceptionleft lower lower extremity Ext - C/C/E Musculoskeletal: Full range of motion in all 4 extremities. No joint swelling MAS of 2-3 in LLE and 1+ in LUE  Assessment/Plan: 1. Functional deficits secondary to MS exacerbation  which require 3+ hours per day of interdisciplinary therapy in a comprehensive inpatient rehab setting.  Physiatrist is providing close team supervision and 24 hour management of active medical problems listed  below.  Physiatrist and rehab team continue to assess barriers to discharge/monitor patient progress toward functional and medical goals  Care Tool:  Bathing    Body parts bathed by patient: Right arm, Left arm, Chest, Abdomen, Front perineal area, Buttocks, Right upper leg, Left upper leg, Right lower leg, Left lower leg, Face         Bathing assist Assist Level: Contact Guard/Touching assist     Upper Body Dressing/Undressing Upper body dressing   What is the patient wearing?: Bra, Pull over shirt    Upper body assist Assist Level: Supervision/Verbal cueing    Lower Body Dressing/Undressing Lower body dressing      What is the patient wearing?: Underwear/pull up, Pants     Lower body assist Assist for lower body dressing: Minimal Assistance - Patient > 75%     Toileting Toileting    Toileting assist Assist for toileting: Contact Guard/Touching assist     Transfers Chair/bed transfer  Transfers assist     Chair/bed transfer assist level: Contact Guard/Touching assist Chair/bed transfer assistive device: Programmer, multimedia   Ambulation assist      Assist level: Contact Guard/Touching assist Assistive device: Walker-rolling(Calf Bioness system on L LE) Max distance: 45'   Walk 10 feet activity   Assist     Assist level: Contact Guard/Touching assist Assistive device: Walker-rolling(Calf Bioness system on L LE)   Walk 50 feet activity   Assist Walk 50 feet with 2 turns activity did not occur: Safety/medical concerns         Walk 150 feet activity  Assist Walk 150 feet activity did not occur: Safety/medical concerns         Walk 10 feet on uneven surface  activity   Assist Walk 10 feet on uneven surfaces activity did not occur: Safety/medical concerns         Wheelchair     Assist Will patient use wheelchair at discharge?: (TBD) Type of Wheelchair: Manual Wheelchair activity did not occur: Safety/medical  concerns  Wheelchair assist level: Supervision/Verbal cueing Max wheelchair distance: 155'    Wheelchair 50 feet with 2 turns activity    Assist    Wheelchair 50 feet with 2 turns activity did not occur: Safety/medical concerns   Assist Level: Supervision/Verbal cueing   Wheelchair 150 feet activity     Assist  Wheelchair 150 feet activity did not occur: Safety/medical concerns   Assist Level: Supervision/Verbal cueing   Blood pressure 104/69, pulse 65, temperature 98.2 F (36.8 C), temperature source Oral, resp. rate 18, height 5\' 9"  (1.753 m), weight 92.3 kg, SpO2 93 %.  Medical Problem List and Plan: 1.  Decreased functional ability with increasing muscle weakness spasms and blurred vision secondary to MS exacerbation.  3 days IV Solu-Medrol completed.  Followed by Dr. neurology services outpatient maintained on [place] as welll as dalfampridine             -CIR  PT and OT evals today 2.  Antithrombotics: -DVT/anticoagulation: Lovenox.  Check vascular study             -antiplatelet therapy: N/A 3. Pain Management/spasticity: Recent insertion of intrathecal baclofen pump 02/18/2019 for spasticity, baclofen 20 mg 3 times daily, Cymbalta 30 mg twice daily, Valium 5 mg every 8 hours as needed, Advil as needed as well as oxycodone Should be able to wean oral  antispasticity agents over time but may need dosage change on pump, no evidence of spasticity this am  Pain does not appear to be spasticity related but instead neurogenic 11/9- Will call Dr 13/9 to increase ITB/pump- can increase myself, but don't want to step on toes; for nerve pain, will try Keppra 500 mg BID 11/10- ITB pump increased 15%- don't know previous or current dose based on note.  4. Mood: Wellbutrin 300 mg daily, Cymbalta 20mg  BID             -antipsychotic agents: N/A 5. Neuropsych: This patient is capable of making decisions on her own behalf. 6. Skin/Wound Care: Routine skin checks 7.  Fluids/Electrolytes/Nutrition: Routine in and outs with follow-up chemistries 8.  Asthma with history of tobacco abuse.  Continue nebulizers as needed as well as scheduled Singulair. 9.  GERD.  Pepcid 10. Neurogenic bowel with constipation- due to MS and ITB pump- will con't Miralax and add Senokot-S 2 tabs qAM  11/11- will add bowel program tonight after supper- if doesn't go today.  11/12- received suppository and had BM; but didn't have dig stim- will check into this. Disposition: Upon discharge from acute rehabilitation, Sharon Odom should have outpatient follow-up with PCP, physiatry, neurology (Dr. 13/12), and neurosurgery (Dr. Herold Harms).   11/11 will also have ENT f/u for dysphonia   LOS: 6 days A FACE TO FACE EVALUATION WAS PERFORMED  Sharon Odom 04/09/2019, 8:56 AM

## 2019-04-09 NOTE — Progress Notes (Signed)
Discussed bowel program with pt. Pt requested bowel program to be done early morning between 0530 and 0600. Passed on to night shift with instructions.

## 2019-04-09 NOTE — Progress Notes (Signed)
Physical Therapy Session Note  Patient Details  Name: Sharon Odom MRN: [id number] Date of Birth: January 06, 1971  Today's Date: 04/09/2019 PT Individual Time: 1520-1620 PT Individual Time Calculation (min): 60 min   Short Term Goals: Week 1:  PT Short Term Goal 1 (Week 1): Pt will complete least restrictive transfers with CGA consistently PT Short Term Goal 2 (Week 1): Pt will ambulate x 50 ft with LRAD and min A PT Short Term Goal 3 (Week 1): Pt will initiate stair training  Skilled Therapeutic Interventions/Progress Updates:   Pt seated in w/c.  She stated neuropathic pain L LE was 6/10; PT suggested she ask for pain meds, which she did.  Santiago Glad, RN dispensed meds at start of session.  neuromuscular re-education via demo, multimodal cues, seated in w/c,  for :R/L hip flexion, R/L long arc quad knee extension with ankle pumps at end range, sustained stretch L heel cord and hamstrings. Reciprocal scooting without use of UEs, assistance for LLE to address pelvic dissociation and core strengthening.  Abdominal crunches seated in w/c, with assistance, fading to cues, with practice.  Discussed avoiding crossing legs due to long-term hip muscle shortening, etc, which pt verbalized understanding of.  She stated that she crosses L over R in order to get L painful/cold foot off of floor.  Pt was willing to try a towel under her L foot, feet apart for LE exs.    Gait training with ACE wrap for L foot drop, x 20' with mod assist to progress LLE for 2 steps, then min assist, mod cues for sequencing.  Pt wt shifted and advanced LLE herself with limited hip and knee flexion.  Stand pivot transfer to bed with RW, min assist.  Sit> supine using leg lifter with supervision and extra time.      Therapy Documentation Precautions:  Precautions Precautions: Fall Restrictions Weight Bearing Restrictions: No  Pain: Pain Assessment Pain Scale: 0-10 Pain Score: 6  Pain Type: Neuropathic pain Pain  Location: Leg Pain Orientation: Right;Left Pain Radiating Towards: bil feet Pain Descriptors / Indicators: Aching;Burning;Sore Pain Frequency: Constant Pain Onset: On-going Pain Intervention(s): Medication (See eMAR)      Therapy/Group: Individual Therapy  Jesse Hirst 04/09/2019, 4:45 PM

## 2019-04-09 NOTE — Progress Notes (Signed)
Occupational Therapy Session Note  Patient Details  Name: Sharon Odom MRN: [id number] Date of Birth: [birth year]  Today's Date: 04/09/2019 OT Individual Time: 0800-0900 OT Individual Time Calculation (min): 60 min    Short Term Goals: Week 1:  OT Short Term Goal 1 (Week 1): Pt will demo energy conservation techniques during LB dressing wiht no VC OT Short Term Goal 2 (Week 1): Pt will transfer to BSC/toilet wiht S consistently OT Short Term Goal 3 (Week 1): Pt will groom in standing wiht S for improved endurance      Skilled Therapeutic Interventions/Progress Updates:    Pt received supine in bed with RN present administering meds, no initial c/o pain. Pt completed transfer to EOB with min A to lift L LE. Completed sit <> stand transfer with CGA and RW to w/c. Pt requesting to use the bathroom, completed toileting on toilet with min A for power up from toilet. Pt completed functional mobility from toilet to walk in shower with min A with RW. Completed UB and LB bathing with (S) and used grab bars for sit <> stand to wash buttocks; required min A for balance in standing to rinse buttocks d/t fatigue. After shower, pt required increased time to dry off and rest; reporting she is very fatigued today. Pt completed UB dressing seated in w/c with (S) and completed Lb dressing with min A d/t time constraint to thread legs into pants and pull over buttocks. Pt completed oral care seated at sink with (S). End of session pt left in bed, bed alarm set and all needs met.   Therapy Documentation Precautions:  Precautions Precautions: Fall Restrictions Weight Bearing Restrictions: No      Therapy/Group: Individual Therapy  Niva Murren 04/09/2019, 9:14 AM

## 2019-04-09 NOTE — Progress Notes (Signed)
Physical Therapy Session Note  Patient Details  Name: Sharon Odom MRN: [id number] Date of Birth: 1971/05/28  Today's Date: 04/09/2019 PT Individual Time: 8453-6468 PT Individual Time Calculation (min): 30 min  PT Co-treatment Time:1015-1045 PT Co-treatment Time Calculation (min): 30 min   Short Term Goals: Week 1:  PT Short Term Goal 1 (Week 1): Pt will complete least restrictive transfers with CGA consistently PT Short Term Goal 2 (Week 1): Pt will ambulate x 50 ft with LRAD and min A PT Short Term Goal 3 (Week 1): Pt will initiate stair training  Skilled Therapeutic Interventions/Progress Updates:     Patient in bed upon PT arrival. Patient alert and agreeable to PT session. Patient reported 3/10 B LE pain/numbness during session. PT provided repositioning, rest breaks, and distraction as pain interventions throughout session. She also reported feeling very fatigued from taking a shower and dressing with OT this morning. The first part of the session focused on discussing use and adjustment of her Bioness system with Bryson Ha, PT clinical specialist for patient to use this device for increased independence and decreased fall risk with functional mobility. The remainder of the session was a co-treatment with [name], recreational therapist focusing on w/c mobility and energy conservation techniques with daily activities and community integration.   Therapeutic Activity: Bed Mobility: Patient performed supine to/from sit with supervision with HOB elevated and use of leg lifter.  Transfers: Patient performed stand pivot transfers x2 using a RW with close supervision. Provided verbal cues for reaching back to sit.  Wheelchair Mobility:  Patient propelled wheelchair 155 feet on level surfaces with CGA-supervision using R hemi-technique. Provided verbal cues for hemi technique and turning technique. She went up a ramp backwards and down forwards in the w/c using a R hemi-technique with min A and  cues. Educated on w/c mobility in the community and at home for energy conservation.   Patient in bed at end of session with breaks locked, bed alarm set, and all needs within reach.    Therapy Documentation Precautions:  Precautions Precautions: Fall Restrictions Weight Bearing Restrictions: No    Therapy/Group: Individual Therapy  Shriya Aker L Mariella Blackwelder PT, DPT  04/09/2019, 12:45 PM

## 2019-04-10 ENCOUNTER — Inpatient Hospital Stay (HOSPITAL_COMMUNITY): Payer: BC Managed Care – PPO | Admitting: Occupational Therapy

## 2019-04-10 ENCOUNTER — Inpatient Hospital Stay (HOSPITAL_COMMUNITY): Payer: BC Managed Care – PPO

## 2019-04-10 ENCOUNTER — Inpatient Hospital Stay (HOSPITAL_COMMUNITY): Payer: BC Managed Care – PPO | Admitting: Physical Therapy

## 2019-04-10 NOTE — Progress Notes (Signed)
[facility] PHYSICAL MEDICINE & REHABILITATION PROGRESS NOTE   Subjective/Complaints:  Pt reports good BM this AM after suppository- tried dig stim this AM (not last night) but said it didn't work- however, not sure they gave it long enough to work.  L side feeling stronger.  Discussed at length about 20 spoons of energy in MS patients. Explained what that meant.   ROS-denies SOB; denies CP, SOB, N/V/D  Objective:   No results found. No results for input(s): WBC, HGB, HCT, PLT in the last 72 hours. No results for input(s): NA, K, CL, CO2, GLUCOSE, BUN, CREATININE, CALCIUM in the last 72 hours.  Intake/Output Summary (Last 24 hours) at 04/10/2019 0835 Last data filed at 04/09/2019 1259 Gross per 24 hour  Intake 462 ml  Output -  Net 462 ml     Physical Exam: Vital Signs Blood pressure 111/60, pulse 61, temperature 97.8 F (36.6 C), temperature source Oral, resp. rate 16, height 5\' 9"  (1.753 m), weight 92.3 kg, SpO2 98 %.   General: No acute distress Sitting up in manual w/c, NT in room; got assistance to transfer from w/c to EOB with RW- did supervision to CGA, was appropriate in transfer techniques; NAD Heart: Regular rate and rhythm  Lungs: Clear to auscultation, B/L Abdomen: (+) BS, soft, ITB pump in abdomen; ND, NT Extremities: No clubbing, cyanosis, or edema- although pt says legs feel swollen; didn't look sign edematous still Skin: No evidence of breakdown, no evidence of rash Neurologic:  motor strength is 5/5 in RIght  4/5 Left  deltoid, bicep, tricep, grip,4- RIght and 3- left  hip flexor, knee extensors, ankle dorsiflexor and plantar flexor Sensory exam reduced to light touch and proprioceptionleft lower lower extremity Ext - C/C/E Musculoskeletal: Full range of motion in all 4 extremities. No joint swelling MAS of 2 on LLE and 1 in LUE after titration of ITB pump  Assessment/Plan: 1. Functional deficits secondary to MS exacerbation  which require 3+ hours per  day of interdisciplinary therapy in a comprehensive inpatient rehab setting.  Physiatrist is providing close team supervision and 24 hour management of active medical problems listed below.  Physiatrist and rehab team continue to assess barriers to discharge/monitor patient progress toward functional and medical goals  Care Tool:  Bathing    Body parts bathed by patient: Right arm, Left arm, Chest, Abdomen, Front perineal area, Buttocks, Right upper leg, Left upper leg, Right lower leg, Left lower leg, Face         Bathing assist Assist Level: Supervision/Verbal cueing(required min A for on sit <> stand to rinse buttocks d/t fatigue)     Upper Body Dressing/Undressing Upper body dressing   What is the patient wearing?: Bra, Pull over shirt    Upper body assist Assist Level: Supervision/Verbal cueing    Lower Body Dressing/Undressing Lower body dressing      What is the patient wearing?: Pants, Underwear/pull up     Lower body assist Assist for lower body dressing: Minimal Assistance - Patient > 75%     Toileting Toileting    Toileting assist Assist for toileting: Minimal Assistance - Patient > 75%     Transfers Chair/bed transfer  Transfers assist     Chair/bed transfer assist level: Minimal Assistance - Patient > 75% Chair/bed transfer assistive device: Armrests, Programmer, multimedia   Ambulation assist      Assist level: Minimal Assistance - Patient > 75% Assistive device: Walker-rolling Max distance: 20   Walk 10  feet activity   Assist     Assist level: Contact Guard/Touching assist Assistive device: Orthosis, Walker-rolling   Walk 50 feet activity   Assist Walk 50 feet with 2 turns activity did not occur: Safety/medical concerns         Walk 150 feet activity   Assist Walk 150 feet activity did not occur: Safety/medical concerns         Walk 10 feet on uneven surface  activity   Assist Walk 10 feet on uneven  surfaces activity did not occur: Safety/medical concerns         Wheelchair     Assist Will patient use wheelchair at discharge?: (TBD) Type of Wheelchair: Manual Wheelchair activity did not occur: Safety/medical concerns  Wheelchair assist level: Supervision/Verbal cueing Max wheelchair distance: 155'    Wheelchair 50 feet with 2 turns activity    Assist    Wheelchair 50 feet with 2 turns activity did not occur: Safety/medical concerns   Assist Level: Supervision/Verbal cueing   Wheelchair 150 feet activity     Assist  Wheelchair 150 feet activity did not occur: Safety/medical concerns   Assist Level: Supervision/Verbal cueing   Blood pressure 111/60, pulse 61, temperature 97.8 F (36.6 C), temperature source Oral, resp. rate 16, height 5\' 9"  (1.753 m), weight 92.3 kg, SpO2 98 %.  Medical Problem List and Plan: 1.  Decreased functional ability with increasing muscle weakness spasms and blurred vision secondary to MS exacerbation.  3 days IV Solu-Medrol completed.  Followed by Dr. neurology services outpatient maintained on [place] as welll as dalfampridine             -CIR  PT and OT evals today  11/13- went over 20 spoons of energy theory for MS with pt and how that plays out- ex: not taking shower daily.   2.  Antithrombotics: -DVT/anticoagulation: Lovenox.  Check vascular study             -antiplatelet therapy: N/A 3. Pain Management/spasticity: Recent insertion of intrathecal baclofen pump 02/18/2019 for spasticity, baclofen 20 mg 3 times daily, Cymbalta 30 mg twice daily, Valium 5 mg every 8 hours as needed, Advil as needed as well as oxycodone Should be able to wean oral  antispasticity agents over time but may need dosage change on pump, no evidence of spasticity this am  Pain does not appear to be spasticity related but instead neurogenic 11/9- Will call Dr 13/9 to increase ITB/pump- can increase myself, but don't want to step on toes; for  nerve pain, will try Keppra 500 mg BID 11/10- ITB pump increased 15%- don't know previous or current dose based on note.  4. Mood: Wellbutrin 300 mg daily, Cymbalta 20mg  BID             -antipsychotic agents: N/A 5. Neuropsych: This patient is capable of making decisions on her own behalf. 6. Skin/Wound Care: Routine skin checks 7. Fluids/Electrolytes/Nutrition: Routine in and outs with follow-up chemistries 8.  Asthma with history of tobacco abuse.  Continue nebulizers as needed as well as scheduled Singulair. 9.  GERD.  Pepcid 10. Neurogenic bowel with constipation- due to MS and ITB pump- will con't Miralax and add Senokot-S 2 tabs qAM  11/11- will add bowel program tonight after supper- if doesn't go today.  11/12- received suppository and had BM; but didn't have dig stim- will check into this.  11/13- tried dig stim- said didn't get a response, however, had "great" response to suppository which hadn't before.  Disposition: Upon discharge from acute rehabilitation, Sharon Odom should have outpatient follow-up with PCP, physiatry, neurology (Sharon Odom), and neurosurgery (Sharon Odom).   11/11 will also have ENT f/u for dysphonia   LOS: 7 days A FACE TO FACE EVALUATION WAS PERFORMED  Sharon Odom 04/10/2019, 8:35 AM

## 2019-04-10 NOTE — Progress Notes (Signed)
Occupational Therapy Session Note  Patient Details  Name: Marvella Jenning MRN: [id number] Date of Birth: [birth year]  Today's Date: 04/10/2019 OT Individual Time: 1300-1415 OT Individual Time Calculation (min): 75 min    Short Term Goals: Week 2:  OT Short Term Goal 1 (Week 2): LTG=STG  Skilled Therapeutic Interventions/Progress Updates:    OT intervention with focus on bed mobility, sitting balance, LUE function; activity tolerance, and safety awareness to increase independence with BADLs.   Supine<>sit EOB with supervision using leg lifter Sitting balance-supervision  Pt engaged in [place] tasks to increase funcitonal use.  Tasks included manipulating deck of cards-dealing, holding, straightening, and placing in box. Pt required rest breaks between each segment.  Pt al so engaged in completing peg pattern on peg board with LUE. Pt completed task with 4 rest breaks.  Pt reported L hand spasms X 3 and used stretching technique to relieve.  Pt performed sit<>stand from EOB and side stepping to prepare for sit>supine with leg lifter.  Pt remained in bed with lunch in place and bed alarm activated.  All needs within reach. Pt commented that she would like to practice kitchen prep tasks next week.   Therapy Documentation Precautions:  Precautions Precautions: Fall Restrictions Weight Bearing Restrictions: No Pain: Pt denies pain this afternoon Therapy/Group: Individual Therapy  Leroy Libman 04/10/2019, 2:29 PM

## 2019-04-10 NOTE — Progress Notes (Signed)
Occupational Therapy Weekly Progress Note  Patient Details  Name: Sharon Odom MRN: [id number] Date of Birth: May 31, 1970  Beginning of progress report period: April 04, 2019 End of progress report period: April 10, 2019  Today's Date: 04/10/2019 OT Individual Time: 1245-8099 OT Individual Time Calculation (min): 60 min    Patient has met 2 of 3 short term goals.  Pt continues to make steady progress in functional mobility, performance of ADLs with energy conservation techniques, standing balance and overall endurance. Pt is able to transfer with contact guard to close supervision with extra time. Pt can bathe and don clothing with extra time with supervision. Pt is making gains towards overall goals of mod I   Patient continues to demonstrate the following deficits: muscle weakness, decreased cardiorespiratoy endurance, impaired timing and sequencing, unbalanced muscle activation and decreased coordination and decreased standing balance and decreased balance strategies and therefore will continue to benefit from skilled OT intervention to enhance overall performance with BADL and Reduce care partner burden.  Patient progressing toward long term goals..  Continue plan of care.  OT Short Term Goals Week 1:  OT Short Term Goal 1 (Week 1): Pt will demo energy conservation techniques during LB dressing wiht no VC OT Short Term Goal 1 - Progress (Week 1): Met OT Short Term Goal 2 (Week 1): Pt will transfer to BSC/toilet wiht S consistently OT Short Term Goal 2 - Progress (Week 1): Met OT Short Term Goal 3 (Week 1): Pt will groom in standing wiht S for improved endurance OT Short Term Goal 3 - Progress (Week 1): Progressing toward goal Week 2:  OT Short Term Goal 1 (Week 2): LTG=STG  Skilled Therapeutic Interventions/Progress Updates:    1:1 Pt completed self care tasks at sink level to conserve energy for next session with physical therapy. PT able to come to EOB mod I with leg lifter for  left LE. Pt ambulated to the toilet in the bathroom with min guard but able to come into standing on/ off toilet with close supervision with extra time. Pt continues to be challenged by fatigue. Also when pt is pulling forth a lot of effort motorically to do a task pt's left hand comes into a fist and pt then has to use other hand to bring it back into a flat hand. However pt does incorporate it without cues throughout session at the diminished level.    Therapy Documentation Precautions:  Precautions Precautions: Fall Restrictions Weight Bearing Restrictions: No General:   Vital Signs: Therapy Vitals Temp: 97.8 F (36.6 C) Temp Source: Oral Pulse Rate: 61 Resp: 16 BP: 111/60 Patient Position (if appropriate): Lying Oxygen Therapy SpO2: 98 % O2 Device: Room Air Pain: Took meds prior to session - able to complete session 4/10 allowed breaks as needed  Therapy/Group: Individual Therapy  [facility] 04/10/2019, 8:08 AM

## 2019-04-10 NOTE — Progress Notes (Signed)
Physical Therapy Session Note  Patient Details  Name: Sharon Odom MRN: [id number] Date of Birth: 1971/02/19  Today's Date: 04/10/2019 PT Individual Time: 1015-1130 PT Individual Time Calculation (min): 75 min   Short Term Goals: Week 1:  PT Short Term Goal 1 (Week 1): Pt will complete least restrictive transfers with CGA consistently PT Short Term Goal 2 (Week 1): Pt will ambulate x 50 ft with LRAD and min A PT Short Term Goal 3 (Week 1): Pt will initiate stair training  Skilled Therapeutic Interventions/Progress Updates:    Pt received seated in bed, agreeable to PT session. Pt reports nerve pain in BLE, L>R. RN able to provide pain medication at beginning of session. Bed mobility Supervision with use of leg lifter. Stand pivot transfer bed to w/c with RW and min A. Toilet transfer with RW and min A. Pt is Supervision for pericare and clothing management in standing with grab bar and RW. Nustep level 1 x 10 min with use of B UE/LE for global endurance training, L hand grip assist. Sit to supine on mat table with min A with assist for LLE management without leg lifter present. Semi-reclined BLE strengthening therex: AAROM heel slides on LLE x 5 reps, AROM heel slides x 10 reps on RLE; quad sets x 5 reps on LLE x 10 reps on RLE; bridges x 2 reps. Therex limited by onset of LLE muscle spasms with isometric contractions. LLE HS stretch 3 x 60 sec. Pt reports significant improvement in LLE pain following HS stretch. Stand pivot transfer back to bed with min A and RW. Sit to supine Supervision. Pt left semi-reclined in bed with needs in reach, bed alarm in place at end of session.  Therapy Documentation Precautions:  Precautions Precautions: Fall Restrictions Weight Bearing Restrictions: No    Therapy/Group: Individual Therapy   Excell Seltzer, PT, DPT  04/10/2019, 12:49 PM

## 2019-04-11 DIAGNOSIS — Z978 Presence of other specified devices: Secondary | ICD-10-CM

## 2019-04-11 DIAGNOSIS — E8809 Other disorders of plasma-protein metabolism, not elsewhere classified: Secondary | ICD-10-CM

## 2019-04-11 DIAGNOSIS — E46 Unspecified protein-calorie malnutrition: Secondary | ICD-10-CM

## 2019-04-11 DIAGNOSIS — J45909 Unspecified asthma, uncomplicated: Secondary | ICD-10-CM

## 2019-04-11 DIAGNOSIS — K592 Neurogenic bowel, not elsewhere classified: Secondary | ICD-10-CM

## 2019-04-11 MED ORDER — PRO-STAT SUGAR FREE PO LIQD
30.0000 mL | Freq: Two times a day (BID) | ORAL | Status: DC
  Administered 2019-04-11 – 2019-04-16 (×10): 30 mL via ORAL
  Filled 2019-04-11 (×11): qty 30

## 2019-04-11 NOTE — Progress Notes (Signed)
[facility] PHYSICAL MEDICINE & REHABILITATION PROGRESS NOTE   Subjective/Complaints: Patient seen laying in bed this morning.  She states she slept well overnight.  She notes improving nausea.  ROS: + Nausea tingling in LUE/LLE:.  Denies CP, SOB, N/V/D  Objective:   No results found. No results for input(s): WBC, HGB, HCT, PLT in the last 72 hours. No results for input(s): NA, K, CL, CO2, GLUCOSE, BUN, CREATININE, CALCIUM in the last 72 hours.  Intake/Output Summary (Last 24 hours) at 04/11/2019 0955 Last data filed at 04/10/2019 1839 Gross per 24 hour  Intake 419 ml  Output -  Net 419 ml     Physical Exam: Vital Signs Blood pressure 115/72, pulse 80, temperature 98.2 F (36.8 C), resp. rate 16, height 5\' 9"  (1.753 m), weight 92.3 kg, SpO2 99 %. Constitutional: No distress . Vital signs reviewed. HENT: Normocephalic.  Atraumatic. Eyes: EOMI. No discharge. Cardiovascular: No JVD. Respiratory: Normal effort.  No stridor. GI: Non-distended. Skin: Warm and dry.  Intact. Psych: Flat. Musc: No edema in extremities.  No tenderness in extremities. Neurologic: Alert Dysphonia Motor: RUE 5/5 proximal distal LUE: 4/5 proximal to distal RLE: 4/5 proximal distal LLE: 2+/5 proximal to distal Increased tone noted in left finger flexors  Assessment/Plan: 1. Functional deficits secondary to MS exacerbation  which require 3+ hours per day of interdisciplinary therapy in a comprehensive inpatient rehab setting.  Physiatrist is providing close team supervision and 24 hour management of active medical problems listed below.  Physiatrist and rehab team continue to assess barriers to discharge/monitor patient progress toward functional and medical goals  Care Tool:  Bathing    Body parts bathed by patient: Right arm, Left arm, Chest, Abdomen, Front perineal area, Buttocks, Right upper leg, Left upper leg, Right lower leg, Left lower leg, Face         Bathing assist Assist Level:  Supervision/Verbal cueing     Upper Body Dressing/Undressing Upper body dressing   What is the patient wearing?: Bra, Pull over shirt    Upper body assist Assist Level: Contact Guard/Touching assist    Lower Body Dressing/Undressing Lower body dressing      What is the patient wearing?: Pants, Underwear/pull up     Lower body assist Assist for lower body dressing: Contact Guard/Touching assist     Toileting Toileting    Toileting assist Assist for toileting: Contact Guard/Touching assist     Transfers Chair/bed transfer  Transfers assist     Chair/bed transfer assist level: Minimal Assistance - Patient > 75% Chair/bed transfer assistive device: Programmer, multimedia   Ambulation assist      Assist level: Minimal Assistance - Patient > 75% Assistive device: Walker-rolling Max distance: 20   Walk 10 feet activity   Assist     Assist level: Contact Guard/Touching assist Assistive device: Orthosis, Walker-rolling   Walk 50 feet activity   Assist Walk 50 feet with 2 turns activity did not occur: Safety/medical concerns         Walk 150 feet activity   Assist Walk 150 feet activity did not occur: Safety/medical concerns         Walk 10 feet on uneven surface  activity   Assist Walk 10 feet on uneven surfaces activity did not occur: Safety/medical concerns         Wheelchair     Assist Will patient use wheelchair at discharge?: (TBD) Type of Wheelchair: Manual Wheelchair activity did not occur: Safety/medical concerns  Wheelchair assist  level: Supervision/Verbal cueing Max wheelchair distance: 155'    Wheelchair 50 feet with 2 turns activity    Assist    Wheelchair 50 feet with 2 turns activity did not occur: Safety/medical concerns   Assist Level: Supervision/Verbal cueing   Wheelchair 150 feet activity     Assist  Wheelchair 150 feet activity did not occur: Safety/medical concerns   Assist Level:  Supervision/Verbal cueing   Blood pressure 115/72, pulse 80, temperature 98.2 F (36.8 C), resp. rate 16, height 5\' 9"  (1.753 m), weight 92.3 kg, SpO2 99 %.  Medical Problem List and Plan: 1.  Decreased functional ability with increasing muscle weakness spasms and blurred vision secondary to MS exacerbation.  3 days IV Solu-Medrol completed.  Followed by Dr. neurology services outpatient maintained on [place] as welll as dalfampridine  Continue CIR  2.  Antithrombotics: -DVT/anticoagulation: Lovenox.    CBC ordered for Monday  Dopplers negative for DVT             -antiplatelet therapy: N/A 3. Pain Management/spasticity: Recent insertion of intrathecal baclofen pump 02/18/2019 for spasticity, baclofen 20 mg 3 times daily, Cymbalta 30 mg twice daily, Valium 5 mg every 8 hours as needed, Advil as needed as well as oxycodone  ITB increased by 15% on 11/10  Trial of Keppra 500 twice daily for neuropathic pain  WHO ordered for left hand 4. Mood: Wellbutrin 300 mg daily, Cymbalta 20mg  BID             -antipsychotic agents: N/A 5. Neuropsych: This patient is capable of making decisions on her own behalf. 6. Skin/Wound Care: Routine skin checks 7. Fluids/Electrolytes/Nutrition: Routine in and outs  BMP within normal limits except for glucose on 11/9 8.  Asthma with history of tobacco abuse.  Continue nebulizers as needed as well as scheduled Singulair. Controlled on 11/14 9.  GERD.  Pepcid 10. Neurogenic bowel with constipation- due to MS and ITB pump  Continue Miralax added Senokot-S 2 tabs qAM  Improving with bowel program 11.  Hypoalbuminemia  Supplement initiated on 11/14  LOS: 8 days A FACE TO FACE EVALUATION WAS PERFORMED  Dameion Briles 12/14 04/11/2019, 9:55 AM

## 2019-04-11 NOTE — Plan of Care (Signed)
  Problem: Consults Goal: RH GENERAL PATIENT EDUCATION Description: See Patient Education module for education specifics. Outcome: Progressing   Problem: RH BOWEL ELIMINATION Goal: RH STG MANAGE BOWEL WITH ASSISTANCE Description: STG Manage Bowel with mod I Assistance. Outcome: Progressing Goal: RH STG MANAGE BOWEL W/MEDICATION W/ASSISTANCE Description: STG Manage Bowel with Medication with mod I Assistance. Outcome: Progressing   Problem: RH SAFETY Goal: RH STG ADHERE TO SAFETY PRECAUTIONS W/ASSISTANCE/DEVICE Description: STG Adhere to Safety Precautions With supervision Assistance/Device. Outcome: Progressing   Problem: RH PAIN MANAGEMENT Goal: RH STG PAIN MANAGED AT OR BELOW PT'S PAIN GOAL Description: Less than 4 on 0-10 scale  Outcome: Progressing   Problem: RH KNOWLEDGE DEFICIT GENERAL Goal: RH STG INCREASE KNOWLEDGE OF SELF CARE AFTER HOSPITALIZATION Description: Pt will be able to verbalize ways to manage pain and safety precautions to use at home with mod I assist prior to DC Outcome: Progressing

## 2019-04-11 NOTE — Progress Notes (Signed)
Orthopedic Tech Progress Note Patient Details:  Sharon Odom 04/26/1971 [id number]  Patient ID: [name], female   DOB: [birth year], 48 y.o.   MRN: [id number]   [name] 04/11/2019, 10:35 AMCalled [facility] left resting hand splint.

## 2019-04-12 ENCOUNTER — Inpatient Hospital Stay (HOSPITAL_COMMUNITY): Payer: BC Managed Care – PPO

## 2019-04-12 DIAGNOSIS — T380X5A Adverse effect of glucocorticoids and synthetic analogues, initial encounter: Secondary | ICD-10-CM

## 2019-04-12 DIAGNOSIS — R739 Hyperglycemia, unspecified: Secondary | ICD-10-CM

## 2019-04-12 DIAGNOSIS — G8220 Paraplegia, unspecified: Secondary | ICD-10-CM

## 2019-04-12 NOTE — Progress Notes (Signed)
Physical Therapy Session Note  Patient Details  Name: Sharon Odom MRN: [id number] Date of Birth: 09/28/1970  Today's Date: 04/12/2019 PT Individual Time: 0800-0907 PT Individual Time Calculation (min): 67 min   Short Term Goals: Week 1:  PT Short Term Goal 1 (Week 1): Pt will complete least restrictive transfers with CGA consistently PT Short Term Goal 2 (Week 1): Pt will ambulate x 50 ft with LRAD and min A PT Short Term Goal 3 (Week 1): Pt will initiate stair training      Skilled Therapeutic Interventions/Progress Updates:   Pt resting in bed.  She rated pain bil LEs 5/10 premedicated.  neuromuscular re-education via multimodal cues for supine- L ankle/foot stretching by PT, R straight leg raises, R/L ankle pumps; L side lying-bil hip flexion/extension with flexed knees, R hip abduction with flexed hips and knees.  Seated in w/c, self stretching L hamstrings with use of foot stool x 3 minutes.  Bed mobility training in flat bed for supine>< R side lying: min assist fading to supervision with cues, especially tucking chin.  R side lying > sitting with min assist, mod cues.  PT discussed and demonstrated use of LAFO.  Pt was initially very resistant and refused to consider one.   After thinking about it for a few minutes, she reported that it might be helpful to wear it in the mornings before she puts her BioNess on.  She stated that she typically wears the BioNess for a couple of hours per day.  PT explained the benefits of an AFO when she is not wearing the Bioness.  PT placed a L AFO with pre-tibial component of her w/c tote bag.  She agreed to try it next PT session.  Gait training with ACE wrap LLL for foot drop, x 20' iwht min assist,  For L knee stance stability, RW.    At end of session, pt seated in wc with needs at hand, calling for NT to go to BR.     Therapy Documentation Precautions:  Precautions Precautions: Fall Restrictions Weight Bearing Restrictions: No    Pain: Pain Assessment Pain Scale: 0-10 Pain Score: 5  Pain Type: Acute pain Pain Location: Foot(Leg) Pain Orientation: Right;Left Pain Descriptors / Indicators: Discomfort;Tingling Pain Frequency: Constant Pain Onset: On-going Patients Stated Pain Goal: 3 Pain Intervention(s): Medication (See eMAR) :      Therapy/Group: Individual Therapy  Kaizer Dissinger 04/12/2019, 9:58 AM

## 2019-04-12 NOTE — Progress Notes (Signed)
[facility] PHYSICAL MEDICINE & REHABILITATION PROGRESS NOTE   Subjective/Complaints: Patient seen laying in bed this AM, working with therapies.  She states she slept well overnight.  She notes little improvement in tone with ITB increases, because she states it was decreased by 10% on admission. Discussed orthosis with therapies and patient. She states she wore her WHO overnight.   ROS: Denies CP, SOB, N/V/D  Objective:   No results found. No results for input(s): WBC, HGB, HCT, PLT in the last 72 hours. No results for input(s): NA, K, CL, CO2, GLUCOSE, BUN, CREATININE, CALCIUM in the last 72 hours.  Intake/Output Summary (Last 24 hours) at 04/12/2019 0949 Last data filed at 04/12/2019 0700 Gross per 24 hour  Intake 840 ml  Output -  Net 840 ml     Physical Exam: Vital Signs Blood pressure 104/75, pulse 73, temperature 97.8 F (36.6 C), resp. rate 18, height 5\' 9"  (1.753 m), weight 92.3 kg, SpO2 100 %. Constitutional: No distress . Vital signs reviewed. HENT: Normocephalic.  Atraumatic. Eyes: EOMI. No discharge. Cardiovascular: No JVD. Respiratory: Normal effort.  No stridor. GI: Non-distended. Skin: Warm and dry.  Intact. Psych: Flat.  Musc: No edema in extremities.  No tenderness in extremities. Neurologic: Alert. Dysphonia Motor: RUE 5/5 proximal distal LUE: 4-/5 proximal to distal RLE: 4/5 proximal distal LLE: 2+/5 proximal to distal, unchanged Increased tone noted in left finger flexors and LE extensor tone  Assessment/Plan: 1. Functional deficits secondary to MS exacerbation  which require 3+ hours per day of interdisciplinary therapy in a comprehensive inpatient rehab setting.  Physiatrist is providing close team supervision and 24 hour management of active medical problems listed below.  Physiatrist and rehab team continue to assess barriers to discharge/monitor patient progress toward functional and medical goals  Care Tool:  Bathing    Body parts  bathed by patient: Right arm, Left arm, Chest, Abdomen, Front perineal area, Buttocks, Right upper leg, Left upper leg, Right lower leg, Left lower leg, Face         Bathing assist Assist Level: Supervision/Verbal cueing     Upper Body Dressing/Undressing Upper body dressing   What is the patient wearing?: Bra, Pull over shirt    Upper body assist Assist Level: Contact Guard/Touching assist    Lower Body Dressing/Undressing Lower body dressing      What is the patient wearing?: Pants, Underwear/pull up     Lower body assist Assist for lower body dressing: Contact Guard/Touching assist     Toileting Toileting    Toileting assist Assist for toileting: Contact Guard/Touching assist     Transfers Chair/bed transfer  Transfers assist     Chair/bed transfer assist level: Minimal Assistance - Patient > 75% Chair/bed transfer assistive device:   Ambulation assist      Assist level: Minimal Assistance - Patient > 75% Assistive device: Walker-rolling Max distance: 20   Walk 10 feet activity   Assist     Assist level: Contact Guard/Touching assist Assistive device: Orthosis, Walker-rolling   Walk 50 feet activity   Assist Walk 50 feet with 2 turns activity did not occur: Safety/medical concerns         Walk 150 feet activity   Assist Walk 150 feet activity did not occur: Safety/medical concerns         Walk 10 feet on uneven surface  activity   Assist Walk 10 feet on uneven surfaces activity did not occur: Safety/medical concerns  Wheelchair     Assist Will patient use wheelchair at discharge?: (TBD) Type of Wheelchair: Manual Wheelchair activity did not occur: Safety/medical concerns  Wheelchair assist level: Supervision/Verbal cueing Max wheelchair distance: 155'    Wheelchair 50 feet with 2 turns activity    Assist    Wheelchair 50 feet with 2 turns activity did not occur:  Safety/medical concerns   Assist Level: Supervision/Verbal cueing   Wheelchair 150 feet activity     Assist  Wheelchair 150 feet activity did not occur: Safety/medical concerns   Assist Level: Supervision/Verbal cueing   Blood pressure 104/75, pulse 73, temperature 97.8 F (36.6 C), resp. rate 18, height 5\' 9"  (1.753 m), weight 92.3 kg, SpO2 100 %.  Medical Problem List and Plan: 1.  Decreased functional ability with increasing muscle weakness spasms and blurred vision secondary to MS exacerbation.  3 days IV Solu-Medrol completed.  Followed by Dr. Felecia Shelling neurology services outpatient maintained on [place] as welll as dalfampridine  Continue CIR  2.  Antithrombotics: -DVT/anticoagulation: Lovenox.    CBC ordered for tomorrow  Dopplers negative for DVT             -antiplatelet therapy: N/A 3. Pain Management/spasticity: Recent insertion of intrathecal baclofen pump 02/18/2019 for spasticity, baclofen 20 mg 3 times daily, Cymbalta 30 mg twice daily, Valium 5 mg every 8 hours as needed, Advil as needed as well as oxycodone  ITB increased by 15% on 11/10, patient states was decreased by 10% on admission, little improvement, may benefit from further increase  Trial of Keppra 500 twice daily for neuropathic pain  WHO ordered for left hand 4. Mood: Wellbutrin 300 mg daily, Cymbalta 20mg  BID             -antipsychotic agents: N/A 5. Neuropsych: This patient is capable of making decisions on her own behalf. 6. Skin/Wound Care: Routine skin checks 7. Fluids/Electrolytes/Nutrition: Routine in and outs  BMP within normal limits except for glucose on 11/9 8.  Asthma with history of tobacco abuse.  Continue nebulizers as needed as well as scheduled Singulair.  Controlled on 11/15 9.  GERD.  Pepcid 10. Neurogenic bowel with constipation- due to MS and ITB pump  Continue Miralax added Senokot-S 2 tabs qAM  Improving overall with bowel program 11.  Hypoalbuminemia  Supplement initiated on  11/14 12. Steroid induced hyperglycemia  Cont to monitor  LOS: 9 days A FACE TO FACE EVALUATION WAS PERFORMED  Sharon Odom Lorie Phenix 04/12/2019, 9:49 AM

## 2019-04-12 NOTE — Care Management (Signed)
[facility] Individual Statement of Services  Patient Name:  Sharon Odom  Date:  04/06/2019  Welcome to the [facility].  Our goal is to provide you with an individualized program based on your diagnosis and situation, designed to meet your specific needs.  With this comprehensive rehabilitation program, you will be expected to participate in at least 3 hours of rehabilitation therapies Monday-Friday, with modified therapy programming on the weekends.  Your rehabilitation program will include the following services:  Physical Therapy (PT), Occupational Therapy (OT), 24 hour per day rehabilitation nursing, Neuropsychology, Case Management (Social Worker), Rehabilitation Medicine, Nutrition Services and Pharmacy Services  Weekly team conferences will be held on Tuesdays to discuss your progress.  Your Social Worker will talk with you frequently to get your input and to update you on team discussions.  Team conferences with you and your family in attendance may also be held.  Expected length of stay: 10-12 days   Overall anticipated outcome: modified independent  Depending on your progress and recovery, your program may change. Your Social Worker will coordinate services and will keep you informed of any changes. Your Social Worker's name and contact numbers are listed  below.  The following services may also be recommended but are not provided by the [facility] will be made to provide these services after discharge if needed.  Arrangements include referral to agencies that provide these services.  Your insurance has been verified to be:  [employer] [place] Your primary doctor is:  [facility]  Pertinent information will be shared with your doctor and your insurance company.  Social Worker:  [place], [place] or ([phone]   Information discussed with and copy given to patient by: Lennart Pall, 04/06/2019, 12:48 PM

## 2019-04-12 NOTE — Plan of Care (Signed)
  Problem: Consults Goal: RH GENERAL PATIENT EDUCATION Description: See Patient Education module for education specifics. Outcome: Progressing   Problem: RH BOWEL ELIMINATION Goal: RH STG MANAGE BOWEL WITH ASSISTANCE Description: STG Manage Bowel with mod I Assistance. Outcome: Progressing Goal: RH STG MANAGE BOWEL W/MEDICATION W/ASSISTANCE Description: STG Manage Bowel with Medication with mod I Assistance. Outcome: Progressing   Problem: RH SAFETY Goal: RH STG ADHERE TO SAFETY PRECAUTIONS W/ASSISTANCE/DEVICE Description: STG Adhere to Safety Precautions With supervision Assistance/Device. Outcome: Progressing   Problem: RH PAIN MANAGEMENT Goal: RH STG PAIN MANAGED AT OR BELOW PT'S PAIN GOAL Description: Less than 4 on 0-10 scale  Outcome: Progressing   Problem: RH KNOWLEDGE DEFICIT GENERAL Goal: RH STG INCREASE KNOWLEDGE OF SELF CARE AFTER HOSPITALIZATION Description: Pt will be able to verbalize ways to manage pain and safety precautions to use at home with mod I assist prior to DC Outcome: Progressing   

## 2019-04-13 ENCOUNTER — Inpatient Hospital Stay (HOSPITAL_COMMUNITY): Payer: BC Managed Care – PPO

## 2019-04-13 DIAGNOSIS — G35 Multiple sclerosis: Secondary | ICD-10-CM | POA: Diagnosis not present

## 2019-04-13 LAB — CBC WITH DIFFERENTIAL/PLATELET
Abs Immature Granulocytes: 0.11 10*3/uL — ABNORMAL HIGH (ref 0.00–0.07)
Basophils Absolute: 0 10*3/uL (ref 0.0–0.1)
Basophils Relative: 0 %
Eosinophils Absolute: 0.1 10*3/uL (ref 0.0–0.5)
Eosinophils Relative: 1 %
HCT: 43.9 % (ref 36.0–46.0)
Hemoglobin: 14.3 g/dL (ref 12.0–15.0)
Immature Granulocytes: 1 %
Lymphocytes Relative: 12 %
Lymphs Abs: 1.1 10*3/uL (ref 0.7–4.0)
MCH: 32.6 pg (ref 26.0–34.0)
MCHC: 32.6 g/dL (ref 30.0–36.0)
MCV: 100 fL (ref 80.0–100.0)
Monocytes Absolute: 0.7 10*3/uL (ref 0.1–1.0)
Monocytes Relative: 7 %
Neutro Abs: 7 10*3/uL (ref 1.7–7.7)
Neutrophils Relative %: 79 %
Platelets: 248 10*3/uL (ref 150–400)
RBC: 4.39 MIL/uL (ref 3.87–5.11)
RDW: 11.9 % (ref 11.5–15.5)
WBC: 9 10*3/uL (ref 4.0–10.5)
nRBC: 0 % (ref 0.0–0.2)

## 2019-04-13 NOTE — Progress Notes (Signed)
[facility] PHYSICAL MEDICINE & REHABILITATION PROGRESS NOTE   Subjective/Complaints: No new issues. Still with tone on left side, upper more than lower ext which has been inhibiting at times with activity.   ROS: Patient denies fever, rash, sore throat, blurred vision, nausea, vomiting, diarrhea, cough, shortness of breath or chest pain, joint or back pain, headache, or mood change.     Objective:   No results found. No results for input(s): WBC, HGB, HCT, PLT in the last 72 hours. No results for input(s): NA, K, CL, CO2, GLUCOSE, BUN, CREATININE, CALCIUM in the last 72 hours.  Intake/Output Summary (Last 24 hours) at 04/13/2019 1020 Last data filed at 04/13/2019 0735 Gross per 24 hour  Intake 480 ml  Output -  Net 480 ml     Physical Exam: Vital Signs Blood pressure 100/63, pulse 62, temperature 98.1 F (36.7 C), temperature source Oral, resp. rate 16, height 5\' 9"  (1.753 m), weight 92.3 kg, SpO2 96 %. Constitutional: No distress . Vital signs reviewed. HEENT: EOMI, oral membranes moist Neck: supple Cardiovascular: RRR without murmur. No JVD    Respiratory: CTA Bilaterally without wheezes or rales. Normal effort    GI: BS +, non-tender, non-distended  Skin: Warm and dry.  Intact. Psych: Flat.  Musc: No edema in extremities.  No tenderness in extremities. Neurologic: Alert. Dysphonia Motor: RUE 5/5 proximal distal LUE: 4-/5 proximal to distal RLE: 4/5 proximal distal LLE: 2+/5 proximal to distal, unchanged Increased tone noted in left finger flexors and LE extensor tone--grossly 1+ to 2/4 11/16  Assessment/Plan: 1. Functional deficits secondary to MS exacerbation  which require 3+ hours per day of interdisciplinary therapy in a comprehensive inpatient rehab setting.  Physiatrist is providing close team supervision and 24 hour management of active medical problems listed below.  Physiatrist and rehab team continue to assess barriers to discharge/monitor patient  progress toward functional and medical goals  Care Tool:  Bathing    Body parts bathed by patient: Right arm, Left arm, Chest, Abdomen, Front perineal area, Buttocks, Right upper leg, Left upper leg, Right lower leg, Left lower leg, Face         Bathing assist Assist Level: Supervision/Verbal cueing     Upper Body Dressing/Undressing Upper body dressing   What is the patient wearing?: Bra, Pull over shirt    Upper body assist Assist Level: Contact Guard/Touching assist    Lower Body Dressing/Undressing Lower body dressing      What is the patient wearing?: Pants, Underwear/pull up     Lower body assist Assist for lower body dressing: Contact Guard/Touching assist     Toileting Toileting    Toileting assist Assist for toileting: Contact Guard/Touching assist     Transfers Chair/bed transfer  Transfers assist     Chair/bed transfer assist level: Minimal Assistance - Patient > 75% Chair/bed transfer assistive device: Programmer, multimedia   Ambulation assist      Assist level: Minimal Assistance - Patient > 75% Assistive device: Walker-rolling Max distance: 20   Walk 10 feet activity   Assist     Assist level: Minimal Assistance - Patient > 75% Assistive device: Walker-rolling, Orthosis   Walk 50 feet activity   Assist Walk 50 feet with 2 turns activity did not occur: Safety/medical concerns         Walk 150 feet activity   Assist Walk 150 feet activity did not occur: Safety/medical concerns         Walk 10 feet on  uneven surface  activity   Assist Walk 10 feet on uneven surfaces activity did not occur: Safety/medical concerns         Wheelchair     Assist Will patient use wheelchair at discharge?: (TBD) Type of Wheelchair: Manual Wheelchair activity did not occur: Safety/medical concerns  Wheelchair assist level: Supervision/Verbal cueing Max wheelchair distance: 155'    Wheelchair 50 feet with 2 turns  activity    Assist    Wheelchair 50 feet with 2 turns activity did not occur: Safety/medical concerns   Assist Level: Supervision/Verbal cueing   Wheelchair 150 feet activity     Assist  Wheelchair 150 feet activity did not occur: Safety/medical concerns   Assist Level: Supervision/Verbal cueing   Blood pressure 100/63, pulse 62, temperature 98.1 F (36.7 C), temperature source Oral, resp. rate 16, height 5\' 9"  (1.753 m), weight 92.3 kg, SpO2 96 %.  Medical Problem List and Plan: 1.  Decreased functional ability with increasing muscle weakness spasms and blurred vision secondary to MS exacerbation.  3 days IV Solu-Medrol completed.  Followed by Dr. neurology services outpatient maintained on [place] as welll as dalfampridine  Continue CIR PT, OT  2.  Antithrombotics: -DVT/anticoagulation: Lovenox.    CBC pending    Dopplers negative for DVT             -antiplatelet therapy: N/A 3. Pain Management/spasticity: Recent insertion of intrathecal baclofen pump 02/18/2019 for spasticity, baclofen 20 mg 3 times daily, Cymbalta 30 mg twice daily, Valium 5 mg every 8 hours as needed, Advil as needed as well as oxycodone  ITB increased by 15% on 11/10, patient states was decreased by 10% on admission--CURRENTLY AT 342.36mcg/day. Will d/w Dr. 26m re: further titration. Has significant tone on exam.  Trial of Keppra 500 twice daily for neuropathic pain  WHO ordered for left hand--pt wearing each night 4. Mood: Wellbutrin 300 mg daily, Cymbalta 20mg  BID             -antipsychotic agents: N/A 5. Neuropsych: This patient is capable of making decisions on her own behalf. 6. Skin/Wound Care: Routine skin checks 7. Fluids/Electrolytes/Nutrition: Routine in and outs  BMP within normal limits except for glucose on 11/9 8.  Asthma with history of tobacco abuse.  Continue nebulizers as needed as well as scheduled Singulair.  Controlled on 11/16 9.  GERD.  Pepcid 10. Neurogenic bowel  with constipation- due to MS and ITB pump  Continue Miralax added Senokot-S 2 tabs qAM  Improving overall with bowel program--+BM 11/16 11.  Hypoalbuminemia  Supplement initiated on 11/14 12. Steroid induced hyperglycemia  Cont to monitor  LOS: 10 days A FACE TO FACE EVALUATION WAS PERFORMED  12/16 04/13/2019, 10:20 AM

## 2019-04-13 NOTE — Plan of Care (Signed)
  Problem: RH Car Transfers Goal: LTG Patient will perform car transfers with assist (PT) Description: LTG: Patient will perform car transfers with assistance (PT). Flowsheets (Taken 04/13/2019 1742) LTG: Pt will perform car transfers with assist:: (downgraded goal) Supervision/Verbal cueing Note: Downgraded goal due to slow progress secondary to L LE weakness and fatigue.    Problem: RH Ambulation Goal: LTG Patient will ambulate in controlled environment (PT) Description: LTG: Patient will ambulate in a controlled environment, # of feet with assistance (PT). Flowsheets (Taken 04/13/2019 1742) LTG: Pt will ambulate in controlled environ  assist needed:: (Downgraded goal) Supervision/Verbal cueing LTG: Ambulation distance in controlled environment: 50 ft with LRAD Note: Downgraded goal due to slow progress secondary to L LE weakness and fatigue.  Goal: LTG Patient will ambulate in home environment (PT) Description: LTG: Patient will ambulate in home environment, # of feet with assistance (PT). Flowsheets (Taken 04/13/2019 1742) LTG: Pt will ambulate in home environ  assist needed:: (Downgraded goal) Independent with assistive device LTG: Ambulation distance in home environment: 25 ft with LRAD Note: Downgraded goal due to slow progress secondary to L LE weakness and fatigue.    Problem: RH Wheelchair Mobility Goal: LTG Patient will propel w/c in controlled environment (PT) Description: LTG: Patient will propel wheelchair in controlled environment, # of feet with assist (PT) Flowsheets Taken 04/13/2019 1742 by Eustace Pen, Pearline Yerby L, PT LTG: Propel w/c distance in controlled environment: (Downgraded goal) 100 ft Taken 04/04/2019 1230 by Excell Seltzer, PT LTG: Pt will propel w/c in controlled environ  assist needed:: Independent with assistive device Note: Downgraded distance due to patient's poor activity tolerance.  Goal: LTG Patient will propel w/c in home environment (PT) Description:  LTG: Patient will propel wheelchair in home environment, # of feet with assistance (PT). Flowsheets (Taken 04/13/2019 1742) LTG: Pt will propel w/c in home environ  assist needed:: (Downgraded goal) Independent with assistive device LTG: Propel w/c distance in home environment: 50 ft Note: Downgraded goal due to patient's poor activity tolerance.   Problem: RH Stairs Goal: LTG Patient will ambulate up and down stairs w/assist (PT) Description: LTG: Patient will ambulate up and down # of stairs with assistance (PT) Outcome: Not Progressing Flowsheets (Taken 04/13/2019 1742) LTG: Pt will ambulate up/down stairs assist needed:: (D/c goal) -- Note: D/c goal as patient's husband is installing a ramp for safe home entry due to patients limited progress towards stair training due to L LE weakness and fatigue.

## 2019-04-13 NOTE — Progress Notes (Signed)
Physical Therapy Weekly Progress Note  Patient Details  Name: Sharon Odom MRN: [id number] Date of Birth: 12/08/1970  Beginning of progress report period: April 04, 2019 End of progress report period: April 13, 2019  Today's Date: 04/13/2019 PT Individual Time: 1415-1540 PT Individual Time Calculation (min): 85 min   Patient has met 2 of 3 short term goals.  She is progressing slowly toward her long term goals and long term goals have been downgraded this week due to slow progress secondary to L LE weakness, increased tone in L UE and LE, and fatigue. She currently requires supervision for bed mobility with HOB elevated and use of a leg lifter, patient has elevating HOB at home, CGA-close supervision for transfers and gait up to 40 feet with a L AFO and shoe cover and R heel lift, and supervision for w/c mobility up to 100 feet using a R hemi-technique. Patient's husband is installing a ramp prior to d/c for improved home access.   Patient continues to demonstrate the following deficits muscle weakness, decreased cardiorespiratoy endurance, abnormal tone and decreased coordination and decreased sitting balance, decreased standing balance, decreased postural control, hemiplegia and decreased balance strategies and therefore will continue to benefit from skilled PT intervention to increase functional independence with mobility.  Patient progressing toward long term goals..  Plan of care revisions: downgraded gait long term goal to 50 feet and discharged stair goal due to ramp installation..  PT Short Term Goals Week 1:  PT Short Term Goal 1 (Week 1): Pt will complete least restrictive transfers with CGA consistently PT Short Term Goal 1 - Progress (Week 1): Met PT Short Term Goal 2 (Week 1): Pt will ambulate x 50 ft with LRAD and min A PT Short Term Goal 2 - Progress (Week 1): Progressing toward goal PT Short Term Goal 3 (Week 1): Pt will initiate stair training PT Short Term Goal 3 -  Progress (Week 1): Discontinued (comment)(Patient's husband is putting in a ramp, patient will not need to perform stairs at home) Week 2:  PT Short Term Goal 1 (Week 2): STG=LTG due to ELOS.  Skilled Therapeutic Interventions/Progress Updates:     Session 1: Patient in bed upon PT arrival. Patient alert and agreeable to PT session. Patient denied pain throughout session, reported receiving pain medicine prior to session for B LE pain this morning.  Therapeutic Activity: Bed Mobility: Patient performed supine to/from sit with supervision using a leg lifter. HOB was elevated for sit to supine as patient is able to elevated her HOB at hom. Provided verbal cues for no use of the rails to simulate home environment. Transfers: Patient performed sit to/from stand x2 and stand pivot bed>BSC>w/c>bed with CGA using a RW. Provided verbal cues for reaching back to sit. Required supervision for peri-care and LB dressing during toileting. She performed car transfers into her husbands personal truck x1 with PT asstisting/demonstrating and x1 with her husband and PT assisting with mod-max A for physical assistance and stepping L LE into the vehicle. Due to the height of the vehicle the patient stood holding the door and seat to step up onto the running board with her R foot then grabbed the handle to pull up and required max-total A for bringing her L foot up to the running board then into the vehicle and mod A to bing her hips to sitting. PT discussed several safety concerns with this transfer and patient and her husband agreed to ask their neighbors if they can borrow their  sedan at d/c for a safer transfer.   Wheelchair Mobility:  Patient was transported in the w/c with total A throughout session for energy conservation and time management.  Patient in bed at end of session with breaks locked, bed alarm set, and all needs within reach.   Session 2: Patient in bed upon PT arrival. Patient alert and agreeable to  PT session. Patient reported 3/10 B LE pain during session, reported that she was pre-medicated prior to session. PT provided repositioning, rest breaks, and distraction as pain interventions throughout session.   Therapeutic Exercise: Patient performed the following exercises with verbal and tactile cues for proper technique. Educated on use of stretching with her husband to manage tone and muscle spasms. -L hamstring stretch in supine 4x1 min -L heel cord stretch with manual overpressure 3x1 min  Therapeutic Activity: Bed Mobility: Patient performed supine to/from sit with supervision using a leg lifter. HOB was elevated for sit to supine as patient is able to elevated her HOB at hom. Provided verbal cues for no use of the rails to simulate home environment. Transfers: Patient performed stand pivot bed<>w/c and sit to/from stand x3 with close supervision for safety using the RW.   Gait Training:  Patient ambulated 20 feet x2 using RW with L AFO and shoe cover and 40 feet x1 using RW with L AFO and shoe cover and R heel lift with close supervision for all trials. Ambulated with decreased L foot clearance resulting in foot drag, improved with R heel lift and decreased stance time on L with reduced buckling on L with L AFO. Provided verbal cues for sequencing to lead with L foot for improved initiation of stepping and increased weight shift to the R when stepping with L.  Wheelchair Mobility:  Patient was transported in the w/c with total A throughout session for energy conservation and time management.  Patient in bed at end of session with breaks locked, bed alarm set, and all needs within reach.    Therapy Documentation Precautions:  Precautions Precautions: Fall Restrictions Weight Bearing Restrictions: No   Therapy/Group: Individual Therapy  Judea Riches L Keath Matera PT, DPT  04/13/2019, 5:19 PM

## 2019-04-13 NOTE — Plan of Care (Signed)
  Problem: Consults Goal: RH GENERAL PATIENT EDUCATION Description: See Patient Education module for education specifics. Outcome: Progressing   Problem: RH BOWEL ELIMINATION Goal: RH STG MANAGE BOWEL WITH ASSISTANCE Description: STG Manage Bowel with mod I Assistance. Outcome: Progressing Flowsheets (Taken 04/13/2019 1803) STG: Pt will manage bowels with assistance: 4-Minimum assistance Goal: RH STG MANAGE BOWEL W/MEDICATION W/ASSISTANCE Description: STG Manage Bowel with Medication with mod I Assistance. Outcome: Progressing Flowsheets (Taken 04/13/2019 1803) STG: Pt will manage bowels with medication with assistance: 4-Minimal assistance   Problem: RH SAFETY Goal: RH STG ADHERE TO SAFETY PRECAUTIONS W/ASSISTANCE/DEVICE Description: STG Adhere to Safety Precautions With supervision Assistance/Device. Outcome: Progressing Flowsheets (Taken 04/13/2019 1803) STG:Pt will adhere to safety precautions with assistance/device: 4-Minimal assistance   Problem: RH PAIN MANAGEMENT Goal: RH STG PAIN MANAGED AT OR BELOW PT'S PAIN GOAL Description: Less than 4 on 0-10 scale  Outcome: Progressing   Problem: RH KNOWLEDGE DEFICIT GENERAL Goal: RH STG INCREASE KNOWLEDGE OF SELF CARE AFTER HOSPITALIZATION Description: Pt will be able to verbalize ways to manage pain and safety precautions to use at home with mod I assist prior to DC Outcome: Progressing

## 2019-04-13 NOTE — Progress Notes (Signed)
Occupational Therapy Session Note  Patient Details  Name: Gearldean Lomanto MRN: [id number] Date of Birth: 08/14/1970  Today's Date: 04/13/2019 OT Individual Time: 2800-3491 OT Individual Time Calculation (min): 75 min    Short Term Goals: Week 2:  OT Short Term Goal 1 (Week 2): LTG=STG      Skilled Therapeutic Interventions/Progress Updates:    Pr received in bed, no c/o pain and reports she has recent dose of pain meds. Pt completed supine <> EOB with (S). Completed functional mobility with RW and CGA to toilet; pt voided urine. Pt is CGA for toileting. Pt completed functional mobility to walk in shower with RW and CGA. Completed UB bathing with distant (S), completed LB bathing with CGA for standing balance while washing and rinsing buttocks. Pt reporting she is very fatigued, completed LB dressing with increased time, taking multiple rest breaks while completed tasks. Required CGA for standing to pull pants over buttocks. Pt (S) for donning socks. Pt completed stand pivot transfer with RW and CGA. Completed grooming tasks and oral care at sink all completed with (S). Pt engaging and using L hand PRN and for support. Pt reported that when she initiate small fine motor movements with L hand, she notices a muscle spasm and increased flex of digits which is sometimes painful. OT encouraged pt to work on using large movements of the hand if needed to decrease spasm and pain. Pt completed UB dressing with (S) and required min A to don bra d/t fatigue. Pt transfers back to bed with stand pivot and RW with CGA. Exited session pt pt in bed, bed alarm set and all needs met.   Therapy Documentation Precautions:  Precautions Precautions: Fall Restrictions Weight Bearing Restrictions: No      Therapy/Group: Individual Therapy  Sakia Schrimpf 04/13/2019, 7:43 AM

## 2019-04-13 NOTE — Progress Notes (Signed)
Subjective: Patient reports Continued improvement in physical abilities.  Still notes some increased tone in her left side predominantly  Objective: Vital signs in last 24 hours: Temp:  [97.8 F (36.6 C)-98.1 F (36.7 C)] 98.1 F (36.7 C) (11/16 0414) Pulse Rate:  [62-88] 81 (11/16 1607) Resp:  [16-18] 18 (11/16 1607) BP: (100-112)/(63-72) 112/63 (11/16 1607) SpO2:  [94 %-96 %] 94 % (11/16 1607)  Intake/Output from previous day: 11/15 0701 - 11/16 0700 In: 240 [P.O.:240] Out: -  Intake/Output this shift: Total I/O In: 480 [P.O.:480] Out: -   Neurologic: Mental status: Alert, oriented, thought content appropriate Cranial nerves: normal Motor: Improved: Range of motion and strength in the left upper extremity from the elbow to the wrist to the fingers.  Grip strength improved As above Resp: Normal excursion, no rhonchi, rales, or wheezing audible Will having some increased spasticity in the left lower extremity  Lab Results: Recent Labs    04/13/19 0942  WBC 9.0  HGB 14.3  HCT 43.9  PLT 248   BMET No results for input(s): NA, K, CL, CO2, GLUCOSE, BUN, CREATININE, CALCIUM in the last 72 hours.  Studies/Results: No results found.  Assessment/Plan: It appears grossly improved and what appears to be an MS exacerbation after Solu-Medrol.   LOS: 10 days  Per patient  And JJHERDEYCXKGYJEH today,  Programs her intrathecal baclofen delivery to increase from 392 micro grams per day in a simple continuous infusion, to 435 micro grams per day (approximately 11% increase).    she is scheduled for discharge on November 20th by her estimation.  She is going to call us when she gets out, we will schedule an intrathecal pump refill, as her expected reservoir alarm date is now 04/25/2019  Bonna Gains 04/13/2019, 4:08 PM

## 2019-04-14 ENCOUNTER — Inpatient Hospital Stay (HOSPITAL_COMMUNITY): Payer: BC Managed Care – PPO

## 2019-04-14 NOTE — Progress Notes (Signed)
Physical Therapy Session Note  Patient Details  Name: Sharon Odom MRN: [id number] Date of Birth: [birth year]  Today's Date: 04/14/2019 PT Individual Time: 1030-1130 PT Individual Time Calculation (min): 60 min   Short Term Goals: Week 2:  PT Short Term Goal 1 (Week 2): STG=LTG due to ELOS.  Skilled Therapeutic Interventions/Progress Updates:   Patient received after OT.  Pt reporting that she "over did it" yesterday, more fatigued today.  Pt had Bioness charged but stated that her electrodes were >month old.  Husband to bring new electrodes; advised pt to discard current electrodes once she has new electrodes.  Once she obtains new electrodes advised pt she may need to turn down intensity of stimulation via her phone app.  No longer able to elicit a contraction in ankle or hamstring when using.  Pt wears Bioness over her pants due to sensitivity to the cold water.  Following NMR with Bioness with new settings pt requested Bioness be removed due to increased sensitivity. pt performed stand pivot w/c > bed with RW and min A with extra time for pivot.  Use of leg lifter and bed rails to assist with lifting LLE for sit > supine with supervision.  Pt lifted each LE to allow therapists to place pillows.  Pt left with all items within reach and bed alarm set.    Therapy Documentation Precautions:  Precautions Precautions: Fall Restrictions Weight Bearing Restrictions: No Pain: Pain Assessment Pain Scale: 0-10 Pain Score: 0-No pain Pain Type: Acute pain Pain Location: Leg Pain Orientation: Right;Left Pain Descriptors / Indicators: Aching;Burning;Pins and needles Pain Frequency: Constant Pain Intervention(s): Medication (See eMAR) Locomotion : Gait Ambulation: Yes Gait Assistance: 2 Helpers;Other (comment)(to facilitate use of Bioness; therapist provided min A overall) Gait Distance (Feet): 60 Feet Assistive device: Rolling walker;4-wheeled walker Gait Assistance Details: Gait with  Bioness first with RW and then with rollator.  Minor adjustments made to intensity while ambulating but therapist did reduce amount of time that Bioness was stimulating ankle DF in stance and hamstrings in stance to prevent knee flexion moment and to allow pt to initiate hip flexion at terminal stance.  Therapist also provided manual and tactile cues for weight shift to R to allow initiation of swing phase and facilitation of timing of swing phase.  With fatigue pt demonstrated increased trunk lateral lean to R and increased WB through UE.  Pt demonstrates greater knee extension weakness and may benefit from use of Bioness on quad as well.  Other Treatments: Treatments Therapeutic Activity: Performed education regarding changes to settings, restart of wear schedule - wear with therapy and then up to one hour past therapy but with stimulation turned off.  Pt continues to be able to pair devices independently.  Will need re-education for donning each cuff.  Re-educated pt on the importance of changing electrodes every 2 weeks and removing electrodes after each wear to allow them to dry.  Took picture of placement for improved carryover. Neuromuscular Facilitation: Left;Lower Extremity;Activity to increase motor control;Activity to increase timing and sequencing;Activity to increase sustained activation;Activity to increase lateral weight shifting Modalities Modalities: Systems analyst Stimulation Location: L anterior tibialis and L hamstring muscle groups Electrical Stimulation Action: Open and closed chain ankle DF, knee flexion and hip extension Electrical Stimulation Parameters: Adjusted intensity and rate of stimulation for lower and upper cuff; see saved parameters in Bioness tablet 1 Electrical Stimulation Goals: Strength;Tone;Neuromuscular facilitation    Therapy/Group: Individual Therapy  Rico Junker 04/14/2019, 11:51 AM

## 2019-04-14 NOTE — Progress Notes (Signed)
[name] PHYSICAL MEDICINE & REHABILITATION PROGRESS NOTE   Subjective/Complaints: No new issues. Still with tone on left side, upper more than lower ext which has been inhibiting at times with activity.  Constipation is better controlled. In very positive mood, happy with progress with therapy.   ROS: Patient denies fever, rash, sore throat, blurred vision, nausea, vomiting, diarrhea, cough, shortness of breath or chest pain, joint or back pain, headache, or mood change.     Objective:   No results found. Recent Labs    04/13/19 0942  WBC 9.0  HGB 14.3  HCT 43.9  PLT 248   No results for input(s): NA, K, CL, CO2, GLUCOSE, BUN, CREATININE, CALCIUM in the last 72 hours.  Intake/Output Summary (Last 24 hours) at 04/14/2019 0912 Last data filed at 04/14/2019 0721 Gross per 24 hour  Intake 590 ml  Output -  Net 590 ml     Physical Exam: Vital Signs Blood pressure 103/74, pulse (!) 58, temperature 97.7 F (36.5 C), temperature source Oral, resp. rate 19, height 5\' 9"  (1.753 m), weight 92.3 kg, SpO2 (!) 88 %. Constitutional: No distress . Vital signs reviewed. Lying in bed, appears comfortable.  HEENT: EOMI, oral membranes moist Neck: supple Cardiovascular: RRR without murmur. No JVD    Respiratory: CTA Bilaterally without wheezes or rales. Normal effort    GI: BS +, non-tender, non-distended  Skin: Warm and dry.  Intact. Psych: Pleasant and friendly Musc: No edema in extremities.  No tenderness in extremities. Neurologic: Alert. Dysphonia Motor: RUE 5/5 proximal distal LUE: 4-/5 proximal to distal RLE: 4/5 proximal distal LLE: 2+/5 proximal to distal, unchanged Increased tone noted in left finger flexors and LE extensor tone--grossly 1+ to 2/4 11/16  Assessment/Plan: 1. Functional deficits secondary to MS exacerbation  which require 3+ hours per day of interdisciplinary therapy in a comprehensive inpatient rehab setting.  Physiatrist is providing close team  supervision and 24 hour management of active medical problems listed below.  Physiatrist and rehab team continue to assess barriers to discharge/monitor patient progress toward functional and medical goals  Care Tool:  Bathing    Body parts bathed by patient: Right arm, Left arm, Chest, Abdomen, Front perineal area, Buttocks, Right upper leg, Left upper leg, Right lower leg, Left lower leg, Face         Bathing assist Assist Level: Contact Guard/Touching assist     Upper Body Dressing/Undressing Upper body dressing   What is the patient wearing?: Bra, Pull over shirt(min A for bra d/t fatigue)    Upper body assist Assist Level: Minimal Assistance - Patient > 75%    Lower Body Dressing/Undressing Lower body dressing      What is the patient wearing?: Pants, Underwear/pull up     Lower body assist Assist for lower body dressing: Contact Guard/Touching assist     Toileting Toileting    Toileting assist Assist for toileting: Contact Guard/Touching assist     Transfers Chair/bed transfer  Transfers assist     Chair/bed transfer assist level: Contact Guard/Touching assist Chair/bed transfer assistive device: 12/16   Ambulation assist      Assist level: Contact Guard/Touching assist Assistive device: Walker-rolling Max distance: 40'   Walk 10 feet activity   Assist     Assist level: Contact Guard/Touching assist Assistive device: Walker-rolling   Walk 50 feet activity   Assist Walk 50 feet with 2 turns activity did not occur: Safety/medical concerns  Walk 150 feet activity   Assist Walk 150 feet activity did not occur: Safety/medical concerns         Walk 10 feet on uneven surface  activity   Assist Walk 10 feet on uneven surfaces activity did not occur: Safety/medical concerns         Wheelchair     Assist Will patient use wheelchair at discharge?: (TBD) Type of Wheelchair:  Manual Wheelchair activity did not occur: Safety/medical concerns  Wheelchair assist level: Supervision/Verbal cueing Max wheelchair distance: 155'    Wheelchair 50 feet with 2 turns activity    Assist    Wheelchair 50 feet with 2 turns activity did not occur: Safety/medical concerns   Assist Level: Supervision/Verbal cueing   Wheelchair 150 feet activity     Assist  Wheelchair 150 feet activity did not occur: Safety/medical concerns   Assist Level: Supervision/Verbal cueing   Blood pressure 103/74, pulse (!) 58, temperature 97.7 F (36.5 C), temperature source Oral, resp. rate 19, height 5\' 9"  (1.753 m), weight 92.3 kg, SpO2 (!) 88 %.  Medical Problem List and Plan: 1.  Decreased functional ability with increasing muscle weakness spasms and blurred vision secondary to MS exacerbation.  3 days IV Solu-Medrol completed.  Followed by Dr. Felecia Shelling neurology services outpatient maintained on Vincent as welll as dalfampridine  Continue CIR PT, OT  2.  Antithrombotics: -DVT/anticoagulation: Lovenox.    Dopplers negative for DVT             -antiplatelet therapy: N/A 3. Pain Management/spasticity: Recent insertion of intrathecal baclofen pump 02/18/2019 for spasticity, baclofen 20 mg 3 times daily, Cymbalta 30 mg twice daily, Valium 5 mg every 8 hours as needed, Advil as needed as well as oxycodone  ITB increased by 15% on 11/10, patient states was decreased by 10% on admission--CURRENTLY AT 342.4mcg/day. Will d/w Dr. Maryjean Ka re: further titration. Has significant tone on exam.  Trial of Keppra 500 twice daily for neuropathic pain  WHO ordered for left hand--pt wearing each night 4. Mood: Wellbutrin 300 mg daily, Cymbalta 20mg  BID             -antipsychotic agents: N/A 5. Neuropsych: This patient is capable of making decisions on her own behalf. 6. Skin/Wound Care: Routine skin checks 7. Fluids/Electrolytes/Nutrition: Routine in and outs  BMP within normal limits except for  glucose on 11/9 8.  Asthma with history of tobacco abuse.  Continue nebulizers as needed as well as scheduled Singulair.  Controlled on 11/16 9.  GERD.  Pepcid 10. Neurogenic bowel with constipation- due to MS and ITB pump  Continue Miralax added Senokot-S 2 tabs qAM  Improving overall with bowel program--+BM 11/16 11.  Hypoalbuminemia  Supplement initiated on 11/14 12. Steroid induced hyperglycemia  Cont to monitor  LOS: 11 days A FACE TO FACE EVALUATION WAS PERFORMED  Sharon Odom P Flower Franko 04/14/2019, 9:12 AM

## 2019-04-14 NOTE — Patient Care Conference (Signed)
Inpatient RehabilitationTeam Conference and Plan of Care Update Date: 04/14/2019   Time: 11:30 AM   Patient Name: Sharon Odom      Medical Record Number: [id number]  Date of Birth: 02/16/1971 Sex: Female         Room/Bed: 4M09C/4M09C-01 Payor Info: Payor: [employer]UE [employer] / Plan: [employer] COMM PPO / Product Type: *No Product type* /    Admit Date/Time:  04/03/2019  4:56 PM  Primary Diagnosis:  Multiple sclerosis exacerbation (HCC)  Patient Active Problem List   Diagnosis Date Noted  . Steroid-induced hyperglycemia   . Hypoalbuminemia due to protein-calorie malnutrition (HCC)   . Neurogenic bowel   . Asthma 04/02/2019  . Constipated 04/02/2019  . Spell of generalized weakness 03/31/2019  . Acute weakness 03/31/2019  . Presence of intrathecal baclofen pump 03/19/2019  . Dysphagia 04/14/2018  . Spastic diplegia, acquired, lower extremity (HCC) 10/16/2017  . Muscle spasticity 08/15/2017  . Gait disturbance 08/15/2017  . Depression with anxiety 08/15/2017  . Diplopia 08/15/2017  . Multiple sclerosis exacerbation (HCC) 03/04/2017    Expected Discharge Date: Expected Discharge Date: 04/16/19  Team Members Present: Physician leading conference: Dr. Faith Rogue Social Worker Present: Amada Jupiter, LCSW Nurse Present: Mosetta Pigeon, RN Case Manager: Roderic Palau, RN PT Present: Serina Cowper, PT OT Present: Roney Mans, OT SLP Present: Suzzette Righter, CF-SLP PPS Coordinator present : Fae Pippin, SLP     Current Status/Progress Goal Weekly Team Focus  Bowel/Bladder   continent of B&B, LBM 11/16  remain continent of B&B  assess B&B q shift and prn, bowel program nightly for no BM prn   Swallow/Nutrition/ Hydration             ADL's   Bathing/Dressing (S)-CGA, functional transfers with CGA-(S), pt continues to be limited by fatigue, toileting (S)  mod I overall  Activity tolerance, awareness, ADL training, endurance, functional transfers   Mobility   CGA-supervision  overall, giat up to 40' with L AFO, shoe cover, and R heel left using a RW, supervision w/c up to 100'  mod I w/c level and gait 25', supervision gait up to 50'  Balance, gait training, functional mobility, NMR, strengthening, ROM, patient/caregiver education   Communication             Safety/Cognition/ Behavioral Observations            Pain   c/o R leg pain, controlled with prn oxycodone 10mg  and tylenol 650mg  prn  pain < or = 3  asses pain q shift and prn   Skin   no skin breakdown  remain free of infection and skin free from breakdown  assess skin q shift and prn    Rehab Goals Patient on target to meet rehab goals: Yes *See Care Plan and progress notes for long and short-term goals.     Barriers to Discharge  Current Status/Progress Possible Resolutions Date Resolved   Nursing                  PT  Inaccessible home environment  5 STE home  Patient's husband installing ramp this week.           OT                  SLP                SW                Discharge Planning/Teaching Needs:  Home with  spouse who can provide intermittent support.  Teaching needs TBD   Team Discussion: MS exacerbation, L hemi, malaise, confusion on admit, on steroids, spastic weakness, neurogenic bowel, ITB pump increased yesterday.  RN - cont B/B, oxy/tylenol for pain, on keppra.  OT muscle spasms L hand, can shower S, dress S, goals mod I.  PT working on safety, mod I w/c level, gait up to 25' for goals, car transfer to truck did not go well, my use neighbor's vehicle for appts, L AFO.   Revisions to Treatment Plan: N/A     Medical Summary Current Status: MS exacerbation. has spastic left hemiparesis. chronic pain. ITB pump has been adjusted. neurogenic bowel and bladder Weekly Focus/Goal: spasticity mgt, pain mgt,  Barriers to Discharge: Medical stability       Continued Need for Acute Rehabilitation Level of Care: The patient requires daily medical management by a physician with  specialized training in physical medicine and rehabilitation for the following reasons: Direction of a multidisciplinary physical rehabilitation program to maximize functional independence : Yes Medical management of patient stability for increased activity during participation in an intensive rehabilitation regime.: Yes Analysis of laboratory values and/or radiology reports with any subsequent need for medication adjustment and/or medical intervention. : Yes   I attest that I was present, lead the team conference, and concur with the assessment and plan of the team.   Retta Diones 04/14/2019, 2:50 PM  Team conference was held via web/ teleconference due to [place] - 19

## 2019-04-14 NOTE — Plan of Care (Signed)
  Problem: Consults Goal: RH GENERAL PATIENT EDUCATION Description: See Patient Education module for education specifics. Outcome: Progressing   Problem: RH BOWEL ELIMINATION Goal: RH STG MANAGE BOWEL WITH ASSISTANCE Description: STG Manage Bowel with mod I Assistance. Outcome: Progressing Goal: RH STG MANAGE BOWEL W/MEDICATION W/ASSISTANCE Description: STG Manage Bowel with Medication with mod I Assistance. Outcome: Progressing   Problem: RH SAFETY Goal: RH STG ADHERE TO SAFETY PRECAUTIONS W/ASSISTANCE/DEVICE Description: STG Adhere to Safety Precautions With supervision Assistance/Device. Outcome: Progressing   Problem: RH PAIN MANAGEMENT Goal: RH STG PAIN MANAGED AT OR BELOW PT'S PAIN GOAL Description: Less than 4 on 0-10 scale  Outcome: Progressing   Problem: RH KNOWLEDGE DEFICIT GENERAL Goal: RH STG INCREASE KNOWLEDGE OF SELF CARE AFTER HOSPITALIZATION Description: Pt will be able to verbalize ways to manage pain and safety precautions to use at home with mod I assist prior to DC Outcome: Progressing   

## 2019-04-14 NOTE — Progress Notes (Signed)
Occupational Therapy Session Note  Patient Details  Name: Sharon Odom MRN: [id number] Date of Birth: 1971/03/08  Today's Date: 04/14/2019 OT Individual Time: 1314-3888 OT Individual Time Calculation (min): 75 min    Short Term Goals:  Week 2:  OT Short Term Goal 1 (Week 2): LTG=STG     Skilled Therapeutic Interventions/Progress Updates:    Pt received in bed, no initial c/o pain but reporting L hand is having muscle spasms and has remained in flexor position since removing resting hand splint this morning. OT gently manipulated digits for light stretch and pt reported no pain. Pt agreeable to shower and grooming task and then stretching L hand to decrease tightness. Pt transfers to EOB with (S) and compelted functional mobility to toilet with RW and (S). Pt voided urine and completed 3/3 steps for toileting with close (S). Pt completed functional mobility to walk in shower with CGA. Pt completed UB and LB bathing with distant (S); completes stand pivot from shower to w/c with (S). Completed UB dressing seated in w/c with (S) and LB dressing with CGA for safety d/t pt reporting she is fatigued and when she stands she feels like she is unable to remain standing. Pt completed grooming with mod I, increased time, while sitting at sink. Pt transferred back to bed with RW and (S). In bed, OT completed light stretching on L hand. OT educated pt on placing hand and arm in extension and away from body when possible to decrease flexor pattern. End of session pt left in bed, bed alarm set and all needs met.   Therapy Documentation Precautions:  Precautions Precautions: Fall Restrictions Weight Bearing Restrictions: No      Therapy/Group: Individual Therapy  Ryan Ogborn 04/14/2019, 7:47 AM

## 2019-04-14 NOTE — Discharge Summary (Signed)
Physician Discharge Summary  Patient ID: Donita BrooksChristy Kwan MRN: [id number] DOB/AGE: 10/14/1970 48 y.o.  Admit date: 04/03/2019 Discharge date: 04/16/2019  Discharge Diagnoses:  Principal Problem:   Multiple sclerosis exacerbation (HCC) Active Problems:   Muscle spasticity   Gait disturbance   Spastic diplegia, acquired, lower extremity (HCC)   Presence of intrathecal baclofen pump   Asthma   Hypoalbuminemia due to protein-calorie malnutrition (HCC)   Neurogenic bowel   Steroid-induced hyperglycemia DVT prophylaxis  Discharged Condition: Stable  Significant Diagnostic Studies: Mr Laqueta JeanBrain W And Wo Contrast  Result Date: 03/31/2019 CLINICAL DATA:  Initial evaluation for increasing leg weakness, spasms, blurry vision. History of multiple sclerosis. EXAM: MRI HEAD WITHOUT AND WITH CONTRAST MRI CERVICAL SPINE WITHOUT AND WITH CONTRAST MRI THORACIC SPINE WITHOUT AND WITH CONTRAST TECHNIQUE: Multiplanar, multiecho pulse sequences of the brain and surrounding structures, and cervical spine, to include the craniocervical junction and cervicothoracic junction, as well as the thoracic spine were obtained without and with intravenous contrast. CONTRAST:  9mL GADAVIST GADOBUTROL 1 MMOL/ML IV SOLN COMPARISON:  Previous MRI from 02/05/2018. FINDINGS: MRI HEAD FINDINGS Brain: Cerebral volume stable, and remains within normal limits for age. Multiple scattered patchy T2/FLAIR hyperintensities seen involving the periventricular, deep, and juxta cortical white matter of both cerebral hemispheres, consistent with provided history of multiple sclerosis. Scattered patchy involvement of the pons and medulla as well as the cerebellum present as well. In comparison with previous MRI, overall appearance has mildly progressed as evidence by a few scattered new and/or more prominent lesions as compared to previous. For example, few small juxta cortical lesions seen within the right parietal region appear to be new from  previous (series 15, image 17). Few of the additional pre cystic lesions or also mildly increased in size. No abnormal restricted diffusion or enhancement to suggest active demyelination on today's exam. No evidence for acute or subacute infarct. Gray-white matter differentiation maintained. No encephalomalacia to suggest chronic cortical infarction. No foci of susceptibility artifact to suggest acute or chronic intracranial hemorrhage. No mass lesion, midline shift or mass effect. No hydrocephalus. No extra-axial fluid collection. Pituitary gland suprasellar region normal. Midline structures intact. Vascular: Major intracranial vascular flow voids are maintained. Skull and upper cervical spine: Craniocervical junction within normal limits. Bone marrow signal intensity normal. 19 mm sclerotic lesion at the right frontal scalp noted, indeterminate, but stable from previous, and of doubtful significance. Scalp soft tissues demonstrate no acute finding. Sinuses/Orbits: Globes and orbital soft tissues within normal limits. Paranasal sinuses are clear. No mastoid effusion. Inner ear structures normal. Other: None. MRI CERVICAL SPINE FINDINGS Alignment: Straightening of the normal cervical lordosis. No listhesis. Vertebrae: Vertebral body height maintained without evidence for acute or chronic fracture. Bone marrow signal intensity within normal limits. Benign hemangioma noted within the C7 vertebral body. No other discrete or worrisome osseous lesions. No abnormal marrow edema. Cord: Patchy signal abnormality within the right hemi cord at the level of C2 (series 4, image 8), not definite seen on previous. Additional patchy signal abnormality within the central cord at the level of C2-3 (series 4, image 11), more prominent as compared to previous. Patchy signal abnormality within the central cord at the level of C3, slightly more prominent (series 4, image 15). Focal signal abnormality within the left dorsal cord at the  level of C3-4 (series 4, image 17), more prominent from previous. Minimal patchy signal abnormality within the central cord at the level of C4 (series 4, image 19), more prominent. Probable additional subtle lesion  within the right dorsal cord at the level of C6 (series 4, image 29), also more prominent. Findings consistent with history of demyelinating disease, and appear progressed from previous. No abnormal enhancement to suggest active demyelination. Posterior Fossa, vertebral arteries, paraspinal tissues: Craniocervical junction within normal limits. Paraspinous and prevertebral soft tissues are normal. Normal intravascular flow voids seen within the vertebral arteries bilaterally. Disc levels: C2-C3: Mild disc bulge with left-sided uncovertebral hypertrophy. No significant spinal stenosis. Mild left C3 foraminal narrowing. C3-C4: Mild disc bulge with left-sided uncovertebral hypertrophy. No significant spinal stenosis. Mild left C4 foraminal narrowing. C4-C5: Mild disc bulge with right-sided uncovertebral hypertrophy. Flattening of the ventral thecal sac without significant spinal stenosis. Moderate right C5 foraminal stenosis. C5-C6: Shallow right paracentral disc protrusion indents the right ventral thecal sac (series 5, image 27). Lateral extension into the right neural foramen. Mild spinal stenosis without cord impingement. Moderate right with mild left C6 foraminal narrowing. C6-C7: Shallow broad-based central disc protrusion indents the ventral thecal sac. Mild spinal stenosis or cord impingement. Superimposed uncovertebral hypertrophy with resultant mild left C7 foraminal stenosis. C7-T1:  Unremarkable. MRI THORACIC SPINE FINDINGS Alignment: Vertebral bodies normally aligned with preservation of the normal thoracic kyphosis. No listhesis or subluxation. Vertebrae: Vertebral body height maintained without evidence for acute or chronic fracture. Bone marrow signal intensity within normal limits. Few  scattered benign hemangiomata noted, with small atypical hemangioma noted within the T5 vertebral body. No worrisome osseous lesions. No abnormal marrow edema or enhancement. Cord: Signal intensity within the thoracic spinal cord is within normal limits. No definite cord lesions to suggest demyelinating disease. Normal cord caliber and morphology. No abnormal enhancement. Paraspinal soft tissues: Paraspinous soft tissues within normal limits. Partially visualized lungs are clear. Visualized visceral structures unremarkable. Disc levels: No significant disc pathology seen within the thoracic spine. No disc bulge or focal disc protrusion. No significant facet disease. No canal or neural foraminal stenosis. No impingement. IMPRESSION: MRI HEAD IMPRESSION: 1. Patchy multifocal signal changes involving the supratentorial and infratentorial cerebral white matter as above, compatible with provided history of multiple sclerosis. Overall, changes are mildly progressed relative to previous MRI from 2019. No evidence for active demyelination. 2. No other acute intracranial abnormality. MRI CERVICAL SPINE IMPRESSION: 1. Patchy multifocal cord signal abnormality involving the cervical spinal cord as detailed above, consistent with history of multiple sclerosis. Overall, appearance appears mildly progressed relative to 2019. No evidence for active demyelination. 2. Degenerative spondylosis at C5-6 and C6-7 with resultant mild spinal stenosis, with moderate right C6 and mild left C7 foraminal narrowing. 3. Mild left C3 and C4 foraminal stenosis, with moderate right C5 foraminal narrowing related to disc bulge and uncovertebral disease. MRI THORACIC SPINE IMPRESSION: Normal MRI of the thoracic spine and spinal cord. No evidence for demyelinating disease. No significant disc pathology, stenosis, or neural impingement. Electronically Signed   By: Rise Mu M.D.   On: 03/31/2019 01:02   Mr Cervical Spine W Or Wo  Contrast  Result Date: 03/31/2019 CLINICAL DATA:  Initial evaluation for increasing leg weakness, spasms, blurry vision. History of multiple sclerosis. EXAM: MRI HEAD WITHOUT AND WITH CONTRAST MRI CERVICAL SPINE WITHOUT AND WITH CONTRAST MRI THORACIC SPINE WITHOUT AND WITH CONTRAST TECHNIQUE: Multiplanar, multiecho pulse sequences of the brain and surrounding structures, and cervical spine, to include the craniocervical junction and cervicothoracic junction, as well as the thoracic spine were obtained without and with intravenous contrast. CONTRAST:  51mL GADAVIST GADOBUTROL 1 MMOL/ML IV SOLN COMPARISON:  Previous MRI from 02/05/2018.  FINDINGS: MRI HEAD FINDINGS Brain: Cerebral volume stable, and remains within normal limits for age. Multiple scattered patchy T2/FLAIR hyperintensities seen involving the periventricular, deep, and juxta cortical white matter of both cerebral hemispheres, consistent with provided history of multiple sclerosis. Scattered patchy involvement of the pons and medulla as well as the cerebellum present as well. In comparison with previous MRI, overall appearance has mildly progressed as evidence by a few scattered new and/or more prominent lesions as compared to previous. For example, few small juxta cortical lesions seen within the right parietal region appear to be new from previous (series 15, image 17). Few of the additional pre cystic lesions or also mildly increased in size. No abnormal restricted diffusion or enhancement to suggest active demyelination on today's exam. No evidence for acute or subacute infarct. Gray-white matter differentiation maintained. No encephalomalacia to suggest chronic cortical infarction. No foci of susceptibility artifact to suggest acute or chronic intracranial hemorrhage. No mass lesion, midline shift or mass effect. No hydrocephalus. No extra-axial fluid collection. Pituitary gland suprasellar region normal. Midline structures intact. Vascular: Major  intracranial vascular flow voids are maintained. Skull and upper cervical spine: Craniocervical junction within normal limits. Bone marrow signal intensity normal. 19 mm sclerotic lesion at the right frontal scalp noted, indeterminate, but stable from previous, and of doubtful significance. Scalp soft tissues demonstrate no acute finding. Sinuses/Orbits: Globes and orbital soft tissues within normal limits. Paranasal sinuses are clear. No mastoid effusion. Inner ear structures normal. Other: None. MRI CERVICAL SPINE FINDINGS Alignment: Straightening of the normal cervical lordosis. No listhesis. Vertebrae: Vertebral body height maintained without evidence for acute or chronic fracture. Bone marrow signal intensity within normal limits. Benign hemangioma noted within the C7 vertebral body. No other discrete or worrisome osseous lesions. No abnormal marrow edema. Cord: Patchy signal abnormality within the right hemi cord at the level of C2 (series 4, image 8), not definite seen on previous. Additional patchy signal abnormality within the central cord at the level of C2-3 (series 4, image 11), more prominent as compared to previous. Patchy signal abnormality within the central cord at the level of C3, slightly more prominent (series 4, image 15). Focal signal abnormality within the left dorsal cord at the level of C3-4 (series 4, image 17), more prominent from previous. Minimal patchy signal abnormality within the central cord at the level of C4 (series 4, image 19), more prominent. Probable additional subtle lesion within the right dorsal cord at the level of C6 (series 4, image 29), also more prominent. Findings consistent with history of demyelinating disease, and appear progressed from previous. No abnormal enhancement to suggest active demyelination. Posterior Fossa, vertebral arteries, paraspinal tissues: Craniocervical junction within normal limits. Paraspinous and prevertebral soft tissues are normal. Normal  intravascular flow voids seen within the vertebral arteries bilaterally. Disc levels: C2-C3: Mild disc bulge with left-sided uncovertebral hypertrophy. No significant spinal stenosis. Mild left C3 foraminal narrowing. C3-C4: Mild disc bulge with left-sided uncovertebral hypertrophy. No significant spinal stenosis. Mild left C4 foraminal narrowing. C4-C5: Mild disc bulge with right-sided uncovertebral hypertrophy. Flattening of the ventral thecal sac without significant spinal stenosis. Moderate right C5 foraminal stenosis. C5-C6: Shallow right paracentral disc protrusion indents the right ventral thecal sac (series 5, image 27). Lateral extension into the right neural foramen. Mild spinal stenosis without cord impingement. Moderate right with mild left C6 foraminal narrowing. C6-C7: Shallow broad-based central disc protrusion indents the ventral thecal sac. Mild spinal stenosis or cord impingement. Superimposed uncovertebral hypertrophy with resultant mild left  C7 foraminal stenosis. C7-T1:  Unremarkable. MRI THORACIC SPINE FINDINGS Alignment: Vertebral bodies normally aligned with preservation of the normal thoracic kyphosis. No listhesis or subluxation. Vertebrae: Vertebral body height maintained without evidence for acute or chronic fracture. Bone marrow signal intensity within normal limits. Few scattered benign hemangiomata noted, with small atypical hemangioma noted within the T5 vertebral body. No worrisome osseous lesions. No abnormal marrow edema or enhancement. Cord: Signal intensity within the thoracic spinal cord is within normal limits. No definite cord lesions to suggest demyelinating disease. Normal cord caliber and morphology. No abnormal enhancement. Paraspinal soft tissues: Paraspinous soft tissues within normal limits. Partially visualized lungs are clear. Visualized visceral structures unremarkable. Disc levels: No significant disc pathology seen within the thoracic spine. No disc bulge or focal  disc protrusion. No significant facet disease. No canal or neural foraminal stenosis. No impingement. IMPRESSION: MRI HEAD IMPRESSION: 1. Patchy multifocal signal changes involving the supratentorial and infratentorial cerebral white matter as above, compatible with provided history of multiple sclerosis. Overall, changes are mildly progressed relative to previous MRI from 2019. No evidence for active demyelination. 2. No other acute intracranial abnormality. MRI CERVICAL SPINE IMPRESSION: 1. Patchy multifocal cord signal abnormality involving the cervical spinal cord as detailed above, consistent with history of multiple sclerosis. Overall, appearance appears mildly progressed relative to 2019. No evidence for active demyelination. 2. Degenerative spondylosis at C5-6 and C6-7 with resultant mild spinal stenosis, with moderate right C6 and mild left C7 foraminal narrowing. 3. Mild left C3 and C4 foraminal stenosis, with moderate right C5 foraminal narrowing related to disc bulge and uncovertebral disease. MRI THORACIC SPINE IMPRESSION: Normal MRI of the thoracic spine and spinal cord. No evidence for demyelinating disease. No significant disc pathology, stenosis, or neural impingement. Electronically Signed   By: Rise Mu M.D.   On: 03/31/2019 01:02   Mr Thoracic Spine W Wo Contrast  Result Date: 03/31/2019 CLINICAL DATA:  Initial evaluation for increasing leg weakness, spasms, blurry vision. History of multiple sclerosis. EXAM: MRI HEAD WITHOUT AND WITH CONTRAST MRI CERVICAL SPINE WITHOUT AND WITH CONTRAST MRI THORACIC SPINE WITHOUT AND WITH CONTRAST TECHNIQUE: Multiplanar, multiecho pulse sequences of the brain and surrounding structures, and cervical spine, to include the craniocervical junction and cervicothoracic junction, as well as the thoracic spine were obtained without and with intravenous contrast. CONTRAST:  9mL GADAVIST GADOBUTROL 1 MMOL/ML IV SOLN COMPARISON:  Previous MRI from  02/05/2018. FINDINGS: MRI HEAD FINDINGS Brain: Cerebral volume stable, and remains within normal limits for age. Multiple scattered patchy T2/FLAIR hyperintensities seen involving the periventricular, deep, and juxta cortical white matter of both cerebral hemispheres, consistent with provided history of multiple sclerosis. Scattered patchy involvement of the pons and medulla as well as the cerebellum present as well. In comparison with previous MRI, overall appearance has mildly progressed as evidence by a few scattered new and/or more prominent lesions as compared to previous. For example, few small juxta cortical lesions seen within the right parietal region appear to be new from previous (series 15, image 17). Few of the additional pre cystic lesions or also mildly increased in size. No abnormal restricted diffusion or enhancement to suggest active demyelination on today's exam. No evidence for acute or subacute infarct. Gray-white matter differentiation maintained. No encephalomalacia to suggest chronic cortical infarction. No foci of susceptibility artifact to suggest acute or chronic intracranial hemorrhage. No mass lesion, midline shift or mass effect. No hydrocephalus. No extra-axial fluid collection. Pituitary gland suprasellar region normal. Midline structures intact. Vascular: Major  intracranial vascular flow voids are maintained. Skull and upper cervical spine: Craniocervical junction within normal limits. Bone marrow signal intensity normal. 19 mm sclerotic lesion at the right frontal scalp noted, indeterminate, but stable from previous, and of doubtful significance. Scalp soft tissues demonstrate no acute finding. Sinuses/Orbits: Globes and orbital soft tissues within normal limits. Paranasal sinuses are clear. No mastoid effusion. Inner ear structures normal. Other: None. MRI CERVICAL SPINE FINDINGS Alignment: Straightening of the normal cervical lordosis. No listhesis. Vertebrae: Vertebral body  height maintained without evidence for acute or chronic fracture. Bone marrow signal intensity within normal limits. Benign hemangioma noted within the C7 vertebral body. No other discrete or worrisome osseous lesions. No abnormal marrow edema. Cord: Patchy signal abnormality within the right hemi cord at the level of C2 (series 4, image 8), not definite seen on previous. Additional patchy signal abnormality within the central cord at the level of C2-3 (series 4, image 11), more prominent as compared to previous. Patchy signal abnormality within the central cord at the level of C3, slightly more prominent (series 4, image 15). Focal signal abnormality within the left dorsal cord at the level of C3-4 (series 4, image 17), more prominent from previous. Minimal patchy signal abnormality within the central cord at the level of C4 (series 4, image 19), more prominent. Probable additional subtle lesion within the right dorsal cord at the level of C6 (series 4, image 29), also more prominent. Findings consistent with history of demyelinating disease, and appear progressed from previous. No abnormal enhancement to suggest active demyelination. Posterior Fossa, vertebral arteries, paraspinal tissues: Craniocervical junction within normal limits. Paraspinous and prevertebral soft tissues are normal. Normal intravascular flow voids seen within the vertebral arteries bilaterally. Disc levels: C2-C3: Mild disc bulge with left-sided uncovertebral hypertrophy. No significant spinal stenosis. Mild left C3 foraminal narrowing. C3-C4: Mild disc bulge with left-sided uncovertebral hypertrophy. No significant spinal stenosis. Mild left C4 foraminal narrowing. C4-C5: Mild disc bulge with right-sided uncovertebral hypertrophy. Flattening of the ventral thecal sac without significant spinal stenosis. Moderate right C5 foraminal stenosis. C5-C6: Shallow right paracentral disc protrusion indents the right ventral thecal sac (series 5, image  27). Lateral extension into the right neural foramen. Mild spinal stenosis without cord impingement. Moderate right with mild left C6 foraminal narrowing. C6-C7: Shallow broad-based central disc protrusion indents the ventral thecal sac. Mild spinal stenosis or cord impingement. Superimposed uncovertebral hypertrophy with resultant mild left C7 foraminal stenosis. C7-T1:  Unremarkable. MRI THORACIC SPINE FINDINGS Alignment: Vertebral bodies normally aligned with preservation of the normal thoracic kyphosis. No listhesis or subluxation. Vertebrae: Vertebral body height maintained without evidence for acute or chronic fracture. Bone marrow signal intensity within normal limits. Few scattered benign hemangiomata noted, with small atypical hemangioma noted within the T5 vertebral body. No worrisome osseous lesions. No abnormal marrow edema or enhancement. Cord: Signal intensity within the thoracic spinal cord is within normal limits. No definite cord lesions to suggest demyelinating disease. Normal cord caliber and morphology. No abnormal enhancement. Paraspinal soft tissues: Paraspinous soft tissues within normal limits. Partially visualized lungs are clear. Visualized visceral structures unremarkable. Disc levels: No significant disc pathology seen within the thoracic spine. No disc bulge or focal disc protrusion. No significant facet disease. No canal or neural foraminal stenosis. No impingement. IMPRESSION: MRI HEAD IMPRESSION: 1. Patchy multifocal signal changes involving the supratentorial and infratentorial cerebral white matter as above, compatible with provided history of multiple sclerosis. Overall, changes are mildly progressed relative to previous MRI from 2019. No evidence  for active demyelination. 2. No other acute intracranial abnormality. MRI CERVICAL SPINE IMPRESSION: 1. Patchy multifocal cord signal abnormality involving the cervical spinal cord as detailed above, consistent with history of multiple  sclerosis. Overall, appearance appears mildly progressed relative to 2019. No evidence for active demyelination. 2. Degenerative spondylosis at C5-6 and C6-7 with resultant mild spinal stenosis, with moderate right C6 and mild left C7 foraminal narrowing. 3. Mild left C3 and C4 foraminal stenosis, with moderate right C5 foraminal narrowing related to disc bulge and uncovertebral disease. MRI THORACIC SPINE IMPRESSION: Normal MRI of the thoracic spine and spinal cord. No evidence for demyelinating disease. No significant disc pathology, stenosis, or neural impingement. Electronically Signed   By: Rise Mu M.D.   On: 03/31/2019 01:02   Vas [place] Lower Extremity Venous (dvt)  Result Date: 04/05/2019  Lower Venous Study Indications: Swelling.  Limitations: Left lower extremity immobile. Comparison Study: No prior study Performing Technologist: Gertie Fey MHA, RDMS, RVT, RDCS  Examination Guidelines: A complete evaluation includes B-mode imaging, spectral Doppler, color Doppler, and power Doppler as needed of all accessible portions of each vessel. Bilateral testing is considered an integral part of a complete examination. Limited examinations for reoccurring indications may be performed as noted.  +---------+---------------+---------+-----------+----------+--------------+ RIGHT    CompressibilityPhasicitySpontaneityPropertiesThrombus Aging +---------+---------------+---------+-----------+----------+--------------+ CFV      Full           Yes      Yes                                 +---------+---------------+---------+-----------+----------+--------------+ SFJ      Full                                                        +---------+---------------+---------+-----------+----------+--------------+ FV Prox  Full                                                        +---------+---------------+---------+-----------+----------+--------------+ FV Mid   Full                                                         +---------+---------------+---------+-----------+----------+--------------+ FV DistalFull                                                        +---------+---------------+---------+-----------+----------+--------------+ PFV      Full                                                        +---------+---------------+---------+-----------+----------+--------------+ POP      Full  Yes      Yes                                 +---------+---------------+---------+-----------+----------+--------------+ PTV      Full                                                        +---------+---------------+---------+-----------+----------+--------------+ Unable to visualize peroneal veins  +---------+---------------+---------+-----------+----------+--------------+ LEFT     CompressibilityPhasicitySpontaneityPropertiesThrombus Aging +---------+---------------+---------+-----------+----------+--------------+ CFV      Full           Yes      Yes                                 +---------+---------------+---------+-----------+----------+--------------+ SFJ      Full                                                        +---------+---------------+---------+-----------+----------+--------------+ FV Prox  Full                                                        +---------+---------------+---------+-----------+----------+--------------+ FV Mid   Full                                                        +---------+---------------+---------+-----------+----------+--------------+ FV DistalFull                                                        +---------+---------------+---------+-----------+----------+--------------+ PFV      Full                                                        +---------+---------------+---------+-----------+----------+--------------+ POP      Full           Yes      Yes                                  +---------+---------------+---------+-----------+----------+--------------+ PTV      Full                                                        +---------+---------------+---------+-----------+----------+--------------+  Unable to visualize peroneal veins.   Summary: Right: There is no evidence of deep vein thrombosis in the lower extremity. However, portions of this examination were limited- see technologist comments above. No cystic structure found in the popliteal fossa. Left: There is no evidence of deep vein thrombosis in the lower extremity. However, portions of this examination were limited- see technologist comments above. No cystic structure found in the popliteal fossa.  *See table(s) above for measurements and observations. Electronically signed by Gretta Began MD on 04/05/2019 at 9:03:54 AM.    Final     Labs:  Basic Metabolic Panel: No results for input(s): NA, K, CL, CO2, GLUCOSE, BUN, CREATININE, CALCIUM, MG, PHOS in the last 168 hours.  CBC: Recent Labs  Lab 04/13/19 0942  WBC 9.0  NEUTROABS 7.0  HGB 14.3  HCT 43.9  MCV 100.0  PLT 248    CBG: No results for input(s): GLUCAP in the last 168 hours.  Family history.  Mother with diabetes and hyperlipidemia.  Denies colon or rectal cancer  Brief HPI:   Gwendoly Baldwin is a 48 y.o. right-handed female with history of asthma, tobacco abuse, anxiety with depression as well as multiple sclerosis diagnosed 2013 followed by neurology services Dr. Epimenio Foot maintained on [place] as well as dalfampridine.  Patient with recent placement of baclofen pump 02/18/2019 per Dr. Ollen Bowl for increasing spasticity related to her MS.  Per chart review lives with spouse.  Two-level home bath and bedroom on Main level.  Independent with assistive device prior to admission.  Presented 03/31/2019 with increasing lower extremity weakness, dysphagia as well as muscle spasms with blurred vision and worsening of chronic left hand  weakness.  She did recently see her ophthalmologist for blurred vision as report no acute findings noted.  Admission chemistries unremarkable SARS Covid negative.  MRI of the brain cervical and thoracic spine with and without contrast obtained revealed multiple chronic demyelinating lesions without acute enhancement.  Neurology follow-up it was advised to begin IV Solu-Medrol x3 doses for suspected MS exacerbation.  Subcutaneous Lovenox for DVT prophylaxis.  Her diet was advanced to regular consistency.  Patient was admitted for a comprehensive rehab program   Hospital Course: Lashawnda Bennick was admitted to rehab 04/03/2019 for inpatient therapies to consist of PT, ST and OT at least three hours five days a week. Past admission physiatrist, therapy team and rehab RN have worked together to provide customized collaborative inpatient rehab.  Pertaining to patient MS exacerbation.  She completed 3 days of IV Solu-Medrol.  She would follow-up outpatient neurology services Dr. Epimenio Foot.  Pain managed with recent insertion of intrathecal baclofen pump 02/18/2019 spasticity followed by Dr. Ollen Bowl.  ITB increased by 15% on 1110 patient states was decreased by 10% on admission currently at 342.24 mcg daily.  Baclofen 20 mg 3 times daily, Cymbalta 30 mg twice daily, Valium 5 mg every 8 hours as needed as well as oxycodone for breakthrough pain.  Trial of Keppra 500 mg twice daily for neuropathic pain.  Mood stabilization with the use of Wellbutrin as well as Cymbalta with emotional support provided.  She did continue on Lovenox for DVT prophylaxis venous Doppler studies negative.  Patient with history of asthma as well as tobacco abuse nebulizers as advised as well as counseled in regards to cessation of nicotine products.  Neurogenic bowel constipation bowel program established improved overall she continued on MiraLAX as well as Senokot.   Blood pressures were monitored on TID basis and controlled Francis Dowse is continent  of  bowel and bladder.  Francis Dowse has made gains during rehab stay and is attending therapies  Francis Dowse will continue to receive follow up therapies   after discharge  Rehab course: During patient's stay in rehab weekly team conferences were held to monitor patient's progress, set goals and discuss barriers to discharge. At admission, patient required mod max assist overall for mobility  Physical exam.  Blood pressure 128/70 pulse 77 temperature 99 respirations 18 oxygen saturations 97% room air General.  Alert and oriented no distress HEENT.  Head normocephalic atraumatic EOMs intact no discharge without nystagmus Neck.  Supple nontender no JVD no tracheal deviation Cardiac regular rate rhythm no murmurs rubs or extra sounds Chest.  Clear to auscultation bilaterally without wheezes rales or rhonchi no distress Abdomen.  Soft nontender nondistended positive bowel sounds Extremities.  No clubbing cyanosis or edema Neurological cognitively appropriate good insight and awareness cranial nerves II through XII intact.  Sensory exam normal fine motor coordination intact no tremors strength 5 out of 5 upper extremities 4- out of 5 throughout the right lower extremity unable to move left lower extremity but patient states her edema was worse than usual.  Francis Dowse  has had improvement in activity tolerance, balance, postural control as well as ability to compensate for deficits. Francis Dowse has had improvement in functional use RUE/LUE  and RLE/LLE as well as improvement in awareness.  Working with energy conservation techniques.  Patient perform stand pivot wheelchair to bed transfers with rolling walker minimal assist with some extra time for pivot.  Use of leg lifter and bed rails to assist with lifting left lower extremity for sitting sit to supine with supervision.  Patient perform supine to sit for bed mobility independently use of hospital bed features.  Perform stand pivot bed to bedside commode to wheelchair and  wheelchair to bed using rolling walker and supervision.  Patient donned bilateral shoes with left AFO minimal assistance.  She ambulated 15 feet using a Rollator contact-guard to close supervision.  Full teaching completed plan discharge to home       Disposition: Discharge disposition: 01-Home or Self Care     Discharge to home   Diet: Regular  Special Instructions: No driving smoking or alcohol  Medications at discharge 1.  Tylenol as needed 2.  Baclofen 20 mg p.o. 3 times daily 3.  Wellbutrin 300 mg p.o. daily 4.  Dulcolax suppository every other day 5.  Vitamin D 5000 units p.o. daily 6.dalfampridine 10 mg p.o. twice daily 7.  Valium 5 mg every 8 hours as needed muscle spasms 8.  Cymbalta 30 mg p.o. twice daily 9.  Pepcid 20 mg p.o. nightly 10.  Advil 400 mg every 8 hours as needed moderate pain 11.  Keppra 500 mg p.o. twice daily 12.  Singulair 10 mg p.o. daily 13.  Ditropan XL 10 mg p.o. daily 14.  Oxycodone 10 mg every 4 hours as needed severe pain 15.  Protonix 40 mg p.o. daily 16.  MiraLAX daily hold for loose stools 17.  Senokot S2 tablet p.o. every morning and one extra after baclofen administration 18.  Vitamin B12 2500 mcg p.o. daily  Discharge Instructions    Ambulatory referral to ENT   Complete by: As directed    Follow up initial referral for dysphonia related to MS exacerbation   Ambulatory referral to Physical Medicine Rehab   Complete by: As directed    Moderate complexity follow-up 1 to 2 weeks MS exacerbation  Follow-up Information    Lovorn, Aundra Millet, MD Follow up.   Specialty: Physical Medicine and Rehabilitation Why: Office to call for appointment Contact information: [address] [place] [address] [phone]        Asa Lente, MD Follow up.   Specialty: Neurology Why: Call for appointment Contact information: [address] [place] [address] [phone]        Odette Fraction, MD Follow up.    Specialty: Anesthesiology Why: call for appointment Contact information: [address] [place] [address] [place] [address] [phone]           Signed: Mcarthur Rossetti Adalyna Godbee 04/16/2019, 5:39 AM

## 2019-04-14 NOTE — Progress Notes (Signed)
Physical Therapy Session Note  Patient Details  Name: Sharon Odom MRN: [id number] Date of Birth: [birth year]  Today's Date: 04/14/2019 PT Individual Time: 1405-1510 PT Individual Time Calculation (min): 65 min   Short Term Goals: Week 2:  PT Short Term Goal 1 (Week 2): STG=LTG due to ELOS.  Skilled Therapeutic Interventions/Progress Updates:     Patient in bed with NT in room upon PT arrival. Patient alert and agreeable to PT session. Patient reported 7-8/10 B LE pain during session, RN made aware and provided pain medicine during session. PT provided repositioning, rest breaks, and distraction as pain interventions throughout session. Patient also reported increased fatigue today due to increased ambulation yesterday and this morning.   Therapeutic Activity: Bed Mobility: Patient performed supine to/from sit with independently with use of hospital bed features and a leg lifter. Patient has bed rails and a mechanical bed that has an elevated HOB at home.  Transfers: Patient performed stand pivot bed>BSC>w/c and w/c>bed using a RW with supervision. PT educated on set up for w/c, BSC, and RW at home for ease of access when home alone. She performed sit to/from stand x2 using a Rollator with CGA-close supervision for safety/balance.   Gait Training:  Patient donned B tennis shoes with L AFO with min A prior to gait training. Patient's husband is bringing another pair of tennis shoes for improved independence with donning L AFO. Patient ambulated 15 feet using rollator with CGA-close supervision and significantly decreased gait speed due to fatigue this session. Ambulated with increased L foot clearance with AFO, but decreased ability to use UE with gait with rollator. Provided verbal cues for increased R weight shift for increased L foot clearance. Patient was independent with use of breaks on the rollator.   Wheelchair Mobility:  Patient propelled wheelchair 110 feet using R UE and LE  hemi-technique with two 90 degree turns with set up assist for R leg rest due to fatigue.   Therapeutic Exercise: Patient performed the following exercises with verbal and tactile cues for proper technique. -L hamstring stretch 4x1 min with manual assist -NuStep x4 min on level 1, 63 steps with manual assist to prevent L hip external rotation  Patient in bed at end of session with breaks locked, bed alarm set, and all needs within reach. Educated on fall risk/prevention at home, use of RW for ambulation at home at this time for safety, and energy conservation techniques to manage fatigue throughout the day during session. Patient in agreement and receptive to all education.    Therapy Documentation Precautions:  Precautions Precautions: Fall Restrictions Weight Bearing Restrictions: No    Therapy/Group: Individual Therapy  Labrian Torregrossa L Braxten Memmer PT, DPT  04/14/2019, 5:01 PM

## 2019-04-15 ENCOUNTER — Inpatient Hospital Stay (HOSPITAL_COMMUNITY): Payer: BC Managed Care – PPO

## 2019-04-15 ENCOUNTER — Inpatient Hospital Stay (HOSPITAL_COMMUNITY): Payer: BC Managed Care – PPO | Admitting: Physical Therapy

## 2019-04-15 MED ORDER — MONTELUKAST SODIUM 10 MG PO TABS: 10.0000 mg | ORAL_TABLET | Freq: Every day | ORAL | 1 refills | Status: DC

## 2019-04-15 MED ORDER — BUPROPION HCL ER (XL) 300 MG PO TB24: 300.0000 mg | ORAL_TABLET | Freq: Every day | ORAL | 1 refills | Status: DC

## 2019-04-15 MED ORDER — CYANOCOBALAMIN 2500 MCG PO TABS: 2500.0000 ug | ORAL_TABLET | Freq: Every day | ORAL | 0 refills | Status: DC

## 2019-04-15 MED ORDER — ACETAMINOPHEN 325 MG PO TABS: 325.0000 mg | ORAL_TABLET | ORAL | Status: DC | PRN

## 2019-04-15 MED ORDER — DULOXETINE HCL 30 MG PO CPEP: 30.0000 mg | ORAL_CAPSULE | Freq: Two times a day (BID) | ORAL | 3 refills | Status: DC

## 2019-04-15 MED ORDER — VITAMIN D-3 125 MCG (5000 UT) PO TABS: 5000.0000 [IU] | ORAL_TABLET | Freq: Every day | ORAL | 1 refills | Status: AC

## 2019-04-15 MED ORDER — SENNOSIDES-DOCUSATE SODIUM 8.6-50 MG PO TABS: 1.0000 | ORAL_TABLET | ORAL | Status: DC

## 2019-04-15 MED ORDER — ALBUTEROL SULFATE HFA 108 (90 BASE) MCG/ACT IN AERS: 1.0000 | INHALATION_SPRAY | Freq: Four times a day (QID) | RESPIRATORY_TRACT | 1 refills | Status: DC | PRN

## 2019-04-15 MED ORDER — VITAMIN B-12 2500 MCG SL SUBL: 2500.0000 ug | SUBLINGUAL_TABLET | Freq: Every day | SUBLINGUAL | 0 refills | Status: DC

## 2019-04-15 MED ORDER — DIAZEPAM 5 MG PO TABS: 5.0000 mg | ORAL_TABLET | Freq: Three times a day (TID) | ORAL | 0 refills | Status: DC | PRN

## 2019-04-15 MED ORDER — RANITIDINE HCL 300 MG PO TABS: 300.0000 mg | ORAL_TABLET | Freq: Every day | ORAL | 0 refills | Status: DC | PRN

## 2019-04-15 MED ORDER — OXYBUTYNIN CHLORIDE ER 10 MG PO TB24: 10.0000 mg | ORAL_TABLET | Freq: Every day | ORAL | 1 refills | Status: DC

## 2019-04-15 MED ORDER — LEVETIRACETAM 1000 MG PO TABS: 1000.0000 mg | ORAL_TABLET | Freq: Two times a day (BID) | ORAL | 1 refills | Status: DC

## 2019-04-15 MED ORDER — POLYETHYLENE GLYCOL 3350 17 G PO PACK: 17.0000 g | PACK | Freq: Every day | ORAL | 0 refills | Status: DC

## 2019-04-15 MED ORDER — OXYCODONE HCL 10 MG PO TABS: 10.0000 mg | ORAL_TABLET | ORAL | 0 refills | Status: DC | PRN

## 2019-04-15 MED ORDER — LEVETIRACETAM 500 MG PO TABS
1000.0000 mg | ORAL_TABLET | Freq: Two times a day (BID) | ORAL | Status: DC
  Administered 2019-04-15 – 2019-04-16 (×2): 1000 mg via ORAL
  Filled 2019-04-15 (×2): qty 2

## 2019-04-15 MED ORDER — BISACODYL 10 MG RE SUPP: 10.0000 mg | Freq: Every day | RECTAL | 0 refills | Status: DC | PRN

## 2019-04-15 MED ORDER — BACLOFEN 20 MG PO TABS: 20.0000 mg | ORAL_TABLET | Freq: Three times a day (TID) | ORAL | 0 refills | Status: DC

## 2019-04-15 NOTE — Progress Notes (Signed)
Physical Therapy Session Note  Patient Details  Name: Sharon Odom MRN: [id number] Date of Birth: September 10, 1970  Today's Date: 04/15/2019 PT Individual Time: 8315-1761 and  1300-1400 PT Individual Time Calculation (min): 90 min and 60 min   Short Term Goals: Week 2:  PT Short Term Goal 1 (Week 2): STG=LTG due to ELOS.  Skilled Therapeutic Interventions/Progress Updates:     Session 1: Patient in bed upon PT arrival. Patient alert and agreeable to PT session. Josh Cadle, [facility] present for w/c evaluation during session. Patient participated in a w/c evaluation and discussed increased independence with home and community mobility with use of a light weight w/c with increased back support, ELRs for reduced B LE pain/edema, and a L arm trough for shoulder and hand support in sitting. Educated patient on use of w/c at home when fatigued or for locomotion >25 feet and in the community. Patient in agreement.   Therapeutic Activity: Bed Mobility: Patient performed rolling R/L and supine to/from sit with mod I using elevated HOB, R bed rail, and a leg lifter, as patient has all of these assistive devices at home.  Transfers: Patient performed stand pivot transfers with RW to/from w/c with mod I without cues, demonstrating safe technique with minimal L knee buckling.   Wheelchair Mobility:  Patient propelled wheelchair 100 feet with mod I using R hemi-technique without cues from therapist. She donned/doffed the leg rests independently with increased time due to poor motor control with L hand.  Patient in bed at end of session with breaks locked, bed alarm set, and all needs within reach.   Session 2: Patient in bed upon PT arrival. Patient alert and agreeable to PT session. Brooke Pace, [facility] present for AFO consult during session.   Therapeutic Activity: Bed Mobility: Patient performed supine to/from sit as above. Transfers: Patient performed stand pivot transfers with RW to/from w/c with mod I and a  sedan height simulated car transfer x1 with min A using a SPC and x1 with supervision using a RW, recommended use of RW at home, patient in agreement. Provided verbal cues for safe use of DME and hand placement during car transfers.  Gait Training:  Patient ambulated 50 feet using RW with L walk-on AFO and shoe cover and R heel lift with mod I for initial 25 feet and close supervision for remaining 25 feet due to increase fatigue and decreased L foot clearance. Ambulated with decreased L foot clearance, decreased L hip and knee flexion in swing, step-to and intermittent step through gait pattern with increased trunk flexion. Provided verbal cues for increased L step length and height for increased L foot clearance and reduced fall risk. Gerald Stabs, [facility] took patient's shoe and will be providing the patient with a L AFO, toe cap, and R foot insert based on gait assessment during session.   Wheelchair Mobility:  Patient was transported in the w/c with total A throughout session for energy conservation and time management.  Patient in bed at end of session with breaks locked, bed alarm set, and all needs within reach. Educated on fall risk prevention and activation of emergency services in the event of a fall during session. Patient informed PT that her husband was not able to complete the ramp at home and will not have it done by d/c tomorrow. Discussed use of front steps with patient and patient agreeable to attempt stairs during next PT session this afternoon.   Therapy Documentation Precautions:  Precautions Precautions: Fall Restrictions Weight Bearing Restrictions:  No    Therapy/Group: Individual Therapy  Antuan Limes L Rosalynn Sergent PT, DPT  04/15/2019, 4:55 PM

## 2019-04-15 NOTE — Progress Notes (Signed)
Physical Therapy Discharge Summary  Patient Details  Name: Sharon Odom MRN: [id number] Date of Birth: [birth year]  Today's Date: 04/15/2019      Patient has met 7 of 7 long term goals due to improved activity tolerance, improved balance, improved postural control, increased strength, decreased pain, ability to compensate for deficits and improved coordination.  Patient to discharge at a wheelchair and short household ambulation level Modified Independent.   Patient's care partner is independent to provide the necessary physical assistance at discharge.   Recommendation:  Patient will benefit from ongoing skilled PT services in home health setting to continue to advance safe functional mobility, address ongoing impairments in balance, strength, functional mobility, gait and stair training, patient/caregiver education, and minimize fall risk.  Equipment: RW and loner light weight wheelchair with B ELRS and increased back support with a L arm trough with a personal w/c ordered   Reasons for discharge: treatment goals met  Patient/family agrees with progress made and goals achieved: Yes   PT Discharge Precautions/Restrictions Precautions Precautions: Fall Restrictions Weight Bearing Restrictions: No Vital Signs  Pain Pain Assessment Pain Scale: 0-10 Pain Score: 6  Pain Type: Acute pain Pain Location: Leg Pain Orientation: Right;Left Pain Descriptors / Indicators: Aching;Burning Pain Frequency: Constant Pain Onset: On-going Patients Stated Pain Goal: 4 Pain Intervention(s): Medication (See eMAR) Vision/Perception  Vision - Assessment Eye Alignment: Within Functional Limits Ocular Range of Motion: Within Functional Limits Alignment/Gaze Preference: Within Defined Limits Tracking/Visual Pursuits: Able to track stimulus in all quads without difficulty Saccades: Within functional limits Convergence: Within functional limits Perception Perception: Within Functional  Limits Praxis Praxis: Intact  Cognition Overall Cognitive Status: Within Functional Limits for tasks assessed Arousal/Alertness: Awake/alert Orientation Level: Oriented X4 Focused Attention: Appears intact Memory: Appears intact Awareness: Appears intact Problem Solving: Appears intact Safety/Judgment: Appears intact Sensation Sensation Light Touch: Impaired Detail Light Touch Impaired Details: Impaired LUE;Impaired RLE;Impaired LLE Proprioception: Impaired by gross assessment Additional Comments: impaired L>R, LE>UE, distal>proximal Coordination Gross Motor Movements are Fluid and Coordinated: No Fine Motor Movements are Fluid and Coordinated: No Coordination and Movement Description: impaired 2/2 BLE and L side weakness Motor  Motor Motor: Abnormal postural alignment and control;Abnormal tone Motor - Discharge Observations: L side weakness and increased tone in L UE and LE d/t MS with recent exacerbation  Mobility Bed Mobility Bed Mobility: Rolling Right;Rolling Left;Supine to Sit;Sit to Supine;Right Sidelying to Sit;Scooting to [facility] Rolling Right: Independent with assistive device(bed rails) Rolling Left: Independent with assistive device(bed rails) Right Sidelying to Sit: Independent with assistive device Supine to Sit: Independent with assistive device(HOB elevated and use of leg lifter) Sit to Supine: Independent with assistive device(use of leg lifter and bed rails) Scooting to HOB: Independent with assistive device(bed rail) Transfers Transfers: Stand to Sit;Stand Pivot Transfers Sit to Stand: Independent with assistive device(with RW) Stand to Sit: Independent with assistive device(with RW) Stand Pivot Transfers: Independent with assistive device(with RW) Stand Pivot Transfer Details: (not needed unless pt is severely fatigues; often required end of session to remidn pt to slow down and bring L foot forward before standing) Transfer (Assistive device): Rolling  walker Locomotion  Gait Ambulation: Yes Gait Assistance: Supervision/Verbal cueing(to facilitate use of Bioness; therapist provided min A overall) Gait Distance (Feet): 40 Feet Assistive device: Rolling walker;Other (Comment)(with L AFO and R heel lift) Gait Assistance Details: Verbal cues for gait pattern Gait Assistance Details: Patient ambulates with use of a Bioness for therapeutic gait with PT only up to 60 feet with min A. She  ambulates up to 25 feet using a RW with a L AFO and R heel lift with mod I for household mobility and supervision up to 40 feet. Gait Gait: Yes Gait Pattern: Decreased step length - left;Decreased stance time - left;Step-to pattern;Step-through pattern;Decreased stride length;Decreased hip/knee flexion - left;Decreased dorsiflexion - left;Decreased weight shift to right;Left foot flat;Decreased trunk rotation;Lateral trunk lean to right;Trunk rotated posteriorly on left;Trunk flexed;Poor foot clearance - left Gait velocity: significantly decreased Wheelchair Mobility Wheelchair Mobility: Yes Wheelchair Assistance: Independent with assistive device Wheelchair Propulsion: Right upper extremity;Right lower extremity Wheelchair Parts Management: Independent(with increased time) Distance: 100 feet  Trunk/Postural Assessment  Cervical Assessment Cervical Assessment: Exceptions to WFL(forward head) Thoracic Assessment Thoracic Assessment: Exceptions to WFL(rounded shoulders) Lumbar Assessment Lumbar Assessment: Exceptions to WFL(posterior pelvic tilt) Postural Control Postural Control: Deficits on evaluation(decreased/delayed)  Balance Balance Balance Assessed: Yes Static Sitting Balance Static Sitting - Balance Support: Feet supported;No upper extremity supported Static Sitting - Level of Assistance: 6: Modified independent (Device/Increase time) Dynamic Sitting Balance Dynamic Sitting - Balance Support: No upper extremity supported;Feet supported Dynamic  Sitting - Level of Assistance: 6: Modified independent (Device/Increase time) Dynamic Sitting - Balance Activities: Forward lean/weight shifting;Reaching for weighted objects;Reaching across midline Static Standing Balance Static Standing - Balance Support: Bilateral upper extremity supported;During functional activity Static Standing - Level of Assistance: 6: Modified independent (Device/Increase time) Dynamic Standing Balance Dynamic Standing - Balance Support: Bilateral upper extremity supported;During functional activity Dynamic Standing - Level of Assistance: 5: Stand by assistance Dynamic Standing - Balance Activities: Reaching across midline;Reaching for objects;Reaching for weighted objects Extremity Assessment  RUE Assessment RUE Assessment: Within Functional Limits LUE Assessment LUE Assessment: Exceptions to [facility] General Strength Comments: spasticity, flexor tone when attempting to use for functional activities. RLE Assessment RLE Assessment: Exceptions to [facility] Passive Range of Motion (PROM) Comments: tight HS General Strength Comments: impaired, see below RLE Strength Right Hip Flexion: 4-/5 Right Knee Flexion: 4+/5 Right Knee Extension: 4+/5 Right Ankle Dorsiflexion: 4+/5 LLE Assessment LLE Assessment: Exceptions to [facility] Passive Range of Motion (PROM) Comments: tight HS, hip flexors, gastroc General Strength Comments: impaired, see below LLE Strength Left Hip Flexion: 1/5 Left Knee Flexion: 3/5 Left Knee Extension: 1/5 Left Ankle Dorsiflexion: 3-/5    Arlenis Blaydes L Carilyn Woolston PT, DPT  04/15/2019, 12:54 PM

## 2019-04-15 NOTE — Progress Notes (Signed)
Occupational Therapy Discharge Summary  Patient Details  Name: Sharon Odom MRN: [id number] Date of Birth: 06-Nov-1970  Today's Date: 04/15/2019 OT Individual Time: 0947-0962 OT Individual Time Calculation (min): 45 min    Patient has met 7 of 9 long term goals due to improved activity tolerance, improved balance, postural control, ability to compensate for deficits, functional use of  LEFT upper and LEFT lower extremity, improved attention, improved awareness and improved coordination.  Patient to discharge at overall Modified Independent level.  Patient's care partner is independent to provide the necessary physical and cognitive assistance at discharge.    Reasons goals not met: Pt shows fatigue after participating in no more than 15 mins of tx; pt will begin sesssions at mod I level and with time requires (S) to CGA for balance with standing to completed LB dressing and completed transfers into shower.   Recommendation:  Patient will benefit from ongoing skilled OT services in home health setting to continue to advance functional skills in the area of BADL and iADL.  Equipment: No equipment provided  Reasons for discharge: treatment goals met  Patient/family agrees with progress made and goals achieved: Yes   Skilled OT Intervention:   Pt received supine in bed, ready for tx. Pt is mod I with AD for all bed mobility. Transfered to toilet with mod I and RW; completed toileting with mod I. Pt is mod I consistently at beginning of session, Pt completed UB and LB bathing with mod I; (S) for out of shower transfer to w/c for safety and d/t fatigue. Pt completed UB dressing with mod I and completed LB dressing with CGA d/t balance. Pt shows severe fatigue after approx 15 mins of tx and requires CGA for standing balance to complete LB dressing towards end of session. Pt is being d/c at mod I overall but continues to need CGA PRN for balance when fatigued. End of session pt left at sink  completed grooming task; call bell in each reach of pt and all needs met.    OT Discharge Precautions/Restrictions  Precautions Precautions: Fall Restrictions Weight Bearing Restrictions: No  Pain Pain Assessment Pain Scale: 0-10 Pain Score: 0-No pain ADL ADL Eating: Modified independent Where Assessed-Eating: Wheelchair Grooming: Modified independent Where Assessed-Grooming: Sitting at sink Upper Body Bathing: Supervision/safety Where Assessed-Upper Body Bathing: Shower Lower Body Bathing: Supervision/safety Where Assessed-Lower Body Bathing: Shower Upper Body Dressing: Modified independent (Device) Where Assessed-Upper Body Dressing: Wheelchair Lower Body Dressing: Supervision/safety Where Assessed-Lower Body Dressing: Wheelchair Toileting: Modified independent Where Assessed-Toileting: Risk analyst Method: Counselling psychologist: (N/A) Tub/Shower Transfer: Close supervison Clinical cytogeneticist Method: Optometrist: Facilities manager: Close supervision Social research officer, government Method: Heritage manager: Gaffer Baseline Vision/History: Wears glasses Wears Glasses: Reading only Patient Visual Report: No change from baseline Vision Assessment?: No apparent visual deficits Eye Alignment: Within Functional Limits Ocular Range of Motion: Within Functional Limits Alignment/Gaze Preference: Within Defined Limits Tracking/Visual Pursuits: Able to track stimulus in all quads without difficulty Saccades: Within functional limits Convergence: Within functional limits Perception  Perception: Within Functional Limits Praxis Praxis: Intact Cognition Overall Cognitive Status: Within Functional Limits for tasks assessed Arousal/Alertness: Awake/alert Orientation Level: Oriented X4 Attention: Focused;Sustained;Selective Focused  Attention: Appears intact Sustained Attention: Appears intact Selective Attention: Appears intact Memory: Appears intact Awareness: Appears intact Problem Solving: Appears intact Executive Function: Reasoning;Sequencing Reasoning: Appears intact Sequencing: Appears intact Safety/Judgment: Appears intact Sensation Sensation Light Touch: Impaired Detail(pt  reporting she has some sensory loss in B feet d/t MS; this is an ongoing symptom) Light Touch Impaired Details: Impaired LUE;Impaired RLE;Impaired LLE Hot/Cold: Appears Intact Proprioception: Appears Intact Stereognosis: Appears Intact Coordination Gross Motor Movements are Fluid and Coordinated: No Fine Motor Movements are Fluid and Coordinated: No Coordination and Movement Description: L side weakness and motor movements not fluid d/t MS Motor  Motor Motor: Abnormal postural alignment and control;Abnormal tone Motor - Discharge Observations: impaired d/t MS and R CVA Mobility  Bed Mobility Bed Mobility: Rolling Right;Rolling Left;Supine to Sit;Sit to Supine;Right Sidelying to Sit;Scooting to [facility] Rolling Right: Independent with assistive device Rolling Left: Independent with assistive device Right Sidelying to Sit: Independent with assistive device Supine to Sit: Independent with assistive device Sit to Supine: Independent with assistive device Scooting to [facility]: Independent with assistive device Transfers Sit to Stand: Independent with assistive device Stand to Sit: Independent with assistive device  Trunk/Postural Assessment  Cervical Assessment Cervical Assessment: Exceptions to WFL(forward head) Thoracic Assessment Thoracic Assessment: Exceptions to WFL(rounded shoulders) Lumbar Assessment Lumbar Assessment: Exceptions to WFL(posterior pelvic tilt) Postural Control Postural Control: Deficits on evaluation  Balance Balance Balance Assessed: Yes Static Sitting Balance Static Sitting - Balance Support: Feet  supported;No upper extremity supported Static Sitting - Level of Assistance: 6: Modified independent (Device/Increase time) Dynamic Sitting Balance Dynamic Sitting - Balance Support: No upper extremity supported;Feet supported Dynamic Sitting - Level of Assistance: 6: Modified independent (Device/Increase time) Dynamic Sitting - Balance Activities: Forward lean/weight shifting;Reaching for weighted objects;Reaching across midline Static Standing Balance Static Standing - Balance Support: Bilateral upper extremity supported;During functional activity Static Standing - Level of Assistance: 6: Modified independent (Device/Increase time) Dynamic Standing Balance Dynamic Standing - Balance Support: Bilateral upper extremity supported;During functional activity Dynamic Standing - Level of Assistance: 5: Stand by assistance Dynamic Standing - Balance Activities: Reaching across midline;Reaching for objects;Reaching for weighted objects Extremity/Trunk Assessment RUE Assessment RUE Assessment: Within Functional Limits LUE Assessment LUE Assessment: Exceptions to [facility] General Strength Comments: spasticity, flexor tone when attempting to use for functional activities.   Sharon Odom 04/15/2019, 9:13 AM

## 2019-04-15 NOTE — Progress Notes (Signed)
Recreational Therapy Session Note  Patient 15Details  Name: Sharon Odom MRN: [id number] Date of Birth: Sep 27, 1970 Today's Date: 04/15/2019 Time:  1415-1500 Pain: no c/o Skilled Therapeutic Interventions/Progress Updates: Primary PT requested that pt practice step negotiation vs community reintegration tasks during this last session of rehab stay, that pt's ramp would not be completed before discharge tomorrow as anticipated.   Dicussed home set up & how pt performed step negotiation PTA.  Pt performed stand pivot transfer from bed to w/c with RW & contact guard assist.  Pt transported to the gym for time management and energy conservation. Once in the therapy gym, pt explained home set up for step negotiation and requested to practice steps as if she were going in through the garage (5 steps) using hand rail.  Pt required min assist for step negotiation.  Discussed transportation home and pt states they are hoping to borrow a neighbors car to get home, but she really doesn't know.  Discussed that a car would be safer to get in and out of than a full size truck and require less energy expenditure.  Pt stated understanding.  Therapy/Group: Co-Treatment   Izabelle Daus 04/15/2019, 3:30 PM

## 2019-04-15 NOTE — Progress Notes (Signed)
Social Work Patient ID: Sharon Odom, female   DOB: 1971/04/17, 48 y.o.   MRN: [id number]  Met with pt following team conference.  She is feeling prepared for d/c tomorrow.  Have reviewed HH and DME needs - referrals made.    Jiraiya Mcewan, LCSW

## 2019-04-15 NOTE — Discharge Instructions (Signed)
Inpatient Rehab Discharge Instructions  Sharon Odom Discharge date and time: No discharge date for patient encounter.   Activities/Precautions/ Functional Status: Activity: activity as tolerated Diet: regular diet Wound Care: none needed Functional status:  ___ No restrictions     ___ Walk up steps independently ___ 24/7 supervision/assistance   ___ Walk up steps with assistance ___ Intermittent supervision/assistance  ___ Bathe/dress independently ___ Walk with walker     _x__ Bathe/dress with assistance ___ Walk Independently    ___ Shower independently ___ Walk with assistance    ___ Shower with assistance ___ No alcohol     ___ Return to work/school ________   COMMUNITY REFERRALS UPON DISCHARGE:    Home Health:   PT     OT                      Agency:  [facility] Phone:  [phone]  Medical Equipment/Items Ordered: wheelchair, walker                                                     Agency/Supplier:  [facility] @ [phone]     Special Instructions: No driving smoking or alcohol   My questions have been answered and I understand these instructions. I will adhere to these goals and the provided educational materials after my discharge from the hospital.  Patient/Caregiver Signature _______________________________ Date __________  Clinician Signature _______________________________________ Date __________  Please bring this form and your medication list with you to all your follow-up doctor's appointments.

## 2019-04-15 NOTE — Progress Notes (Addendum)
[place] PHYSICAL MEDICINE & REHABILITATION PROGRESS NOTE   Subjective/Complaints: No new issues. Still with tone on left side, upper more than lower ext which has been inhibiting at times with activity.  Constipation is better controlled. In very positive mood, happy with progress with therapy.  Asks about increase of Keppra as she is still having neuropathic pain and is tolerating her current dose well.   ROS: Patient denies fever, rash, sore throat, blurred vision, nausea, vomiting, diarrhea, cough, shortness of breath or chest pain, joint or back pain, headache, or mood change.     Objective:   No results found. Recent Labs    04/13/19 0942  WBC 9.0  HGB 14.3  HCT 43.9  PLT 248   No results for input(s): NA, K, CL, CO2, GLUCOSE, BUN, CREATININE, CALCIUM in the last 72 hours.  Intake/Output Summary (Last 24 hours) at 04/15/2019 1032 Last data filed at 04/15/2019 0730 Gross per 24 hour  Intake 520 ml  Output -  Net 520 ml     Physical Exam: Vital Signs Blood pressure 104/63, pulse (!) 58, temperature 98 F (36.7 C), resp. rate 18, height 5\' 9"  (1.753 m), weight 92.3 kg, SpO2 97 %. Constitutional: No distress . Vital signs reviewed. Sitting in WC, appears comfortable.  HEENT: EOMI, oral membranes moist Neck: supple Cardiovascular: RRR without murmur. No JVD    Respiratory: CTA Bilaterally without wheezes or rales. Normal effort    GI: BS +, non-tender, non-distended  Skin: Warm and dry.  Intact. Psych: Pleasant and friendly Musc: No edema in extremities.  No tenderness in extremities. Neurologic: Alert. Dysphonia Motor: RUE 5/5 proximal distal LUE: 4/5 proximal to distal RLE: 4+/5 proximal distal LLE: 2+/5 proximal to distal, unchanged Increased tone noted in left finger flexors and LE extensor tone--grossly 1+ to 2/4 11/16  Assessment/Plan: 1. Functional deficits secondary to MS exacerbation  which require 3+ hours per day of interdisciplinary therapy in a  comprehensive inpatient rehab setting.  Physiatrist is providing close team supervision and 24 hour management of active medical problems listed below.  Physiatrist and rehab team continue to assess barriers to discharge/monitor patient progress toward functional and medical goals  Care Tool:  Bathing    Body parts bathed by patient: Right arm, Left arm, Chest, Abdomen, Front perineal area, Buttocks, Right upper leg, Left upper leg, Right lower leg, Left lower leg, Face         Bathing assist Assist Level: Independent with assistive device     Upper Body Dressing/Undressing Upper body dressing   What is the patient wearing?: Pull over shirt, Bra    Upper body assist Assist Level: Independent with assistive device    Lower Body Dressing/Undressing Lower body dressing      What is the patient wearing?: Pants, Underwear/pull up     Lower body assist Assist for lower body dressing: Supervision/Verbal cueing((S)-CGA d/t fatigue)     Toileting Toileting    Toileting assist Assist for toileting: Independent with assistive device     Transfers Chair/bed transfer  Transfers assist     Chair/bed transfer assist level: Supervision/Verbal cueing Chair/bed transfer assistive device: 12/16   Ambulation assist      Assist level: Contact Guard/Touching assist Assistive device: Rollator Max distance: 15'   Walk 10 feet activity   Assist     Assist level: Contact Guard/Touching assist Assistive device: Rollator   Walk 50 feet activity   Assist Walk 50 feet with 2 turns activity  did not occur: Safety/medical concerns  Assist level: 2 helpers Assistive device: Walker-rolling, Other (comment)    Walk 150 feet activity   Assist Walk 150 feet activity did not occur: Safety/medical concerns         Walk 10 feet on uneven surface  activity   Assist Walk 10 feet on uneven surfaces activity did not occur: Safety/medical  concerns         Wheelchair     Assist Will patient use wheelchair at discharge?: Yes Type of Wheelchair: Manual Wheelchair activity did not occur: Safety/medical concerns  Wheelchair assist level: Set up assist Max wheelchair distance: 110'    Wheelchair 50 feet with 2 turns activity    Assist    Wheelchair 50 feet with 2 turns activity did not occur: Safety/medical concerns   Assist Level: Set up assist   Wheelchair 150 feet activity     Assist  Wheelchair 150 feet activity did not occur: Safety/medical concerns   Assist Level: Supervision/Verbal cueing   Blood pressure 104/63, pulse (!) 58, temperature 98 F (36.7 C), resp. rate 18, height 5\' 9"  (1.753 m), weight 92.3 kg, SpO2 97 %.  Medical Problem List and Plan: 1.  Decreased functional ability with increasing muscle weakness spasms and blurred vision secondary to MS exacerbation.  3 days IV Solu-Medrol completed.  Followed by Dr. Felecia Shelling neurology services outpatient maintained on [place] as welll as dalfampridine  Continue CIR PT, OT  2.  Antithrombotics: -DVT/anticoagulation: Lovenox.    Dopplers negative for DVT             -antiplatelet therapy: N/A 3. Pain Management/spasticity: Recent insertion of intrathecal baclofen pump 02/18/2019 for spasticity, baclofen 20 mg 3 times daily, Cymbalta 30 mg twice daily, Valium 5 mg every 8 hours as needed, Advil as needed as well as oxycodone  ITB increased by 15% on 11/10, patient states was decreased by 10% on admission--CURRENTLY AT 342.26mcg/day. Will d/w Dr. Maryjean Ka re: further titration. Has significant tone on exam.  Trial of Keppra 500 twice daily for neuropathic pain, tolerating well so increased to 1000 BID on 11/18  WHO ordered for left hand--pt wearing each night 4. Mood: Wellbutrin 300 mg daily, Cymbalta 20mg  BID             -antipsychotic agents: N/A 5. Neuropsych: This patient is capable of making decisions on her own behalf. 6. Skin/Wound Care:  Routine skin checks 7. Fluids/Electrolytes/Nutrition: Routine in and outs  BMP within normal limits except for glucose on 11/9 8.  Asthma with history of tobacco abuse.  Continue nebulizers as needed as well as scheduled Singulair.  Controlled on 11/16 9.  GERD.  Pepcid 10. Neurogenic bowel with constipation- due to MS and ITB pump  Continue Miralax added Senokot-S 2 tabs qAM  Improving overall with bowel program--+BM 11/16 11.  Hypoalbuminemia  Supplement initiated on 11/14 12. Steroid induced hyperglycemia  Cont to monitor 13. DME: 1) I am seeing patient for a Face to Face for an Astronomer  2) I have reviewed and agree with the PT eval for Astronomer.   LOS: 12 days A FACE TO FACE EVALUATION WAS PERFORMED  Clide Deutscher Sharon Odom 04/15/2019, 10:32 AM

## 2019-04-15 NOTE — Progress Notes (Signed)
Physical Therapy Session Note  Patient Details  Name: Sharon Odom MRN: [id number] Date of Birth: 1970/11/15  Today's Date: 04/15/2019 PT Individual Time: 1425-1505 PT Individual Time Calculation (min): 40 min   Short Term Goals: Week 2:  PT Short Term Goal 1 (Week 2): STG=LTG due to ELOS.  Skilled Therapeutic Interventions/Progress Updates: Pt presented in bed agreeable to therapy with Sharon Odom, RT present. Session focused on functional mobility stair training as ramp not ready and pt stating wishes to use 5 steps to garage to enter house vs 1 step without rails in back of house. Pt performed bed mobility mod I with use of leg lifter and increased time. Performed stand pivot transfer to w/c with increased time and supervision due to increased fatigue from pt. PTA re-applied ace bandage of DF assist vs AFO. Pt transported to rehab gym and discussed safety in performing x 5 steps and set up to enter stairs. Although rationale explained for using single step for energy conservation pt prefers using garage entry. Pt was able to ascend/descend x 4 steps with 2 rails with CGA and pt circumducting LLE with hip hike to provide clearance onto step. Pt was able to descend sidestepping with 1 rail CGA with safe manner. Pt transported back to room and performed ambulatory transfer to bed. Pt transferred to supine mod I with use of leg lifter. Pt repositioned to comfort and left with bed alarm on, call bell within reach and needs met.       Therapy Documentation Precautions:  Precautions Precautions: Fall Restrictions Weight Bearing Restrictions: No General:   Vital Signs:  Pain:   Mobility: Bed Mobility Bed Mobility: Rolling Right;Rolling Left;Supine to Sit;Sit to Supine;Right Sidelying to Sit;Scooting to [facility] Rolling Right: Independent with assistive device(bed rails) Rolling Left: Independent with assistive device(bed rails) Supine to Sit: Independent with assistive device(HOB elevated and use of  leg lifter) Sit to Supine: Independent with assistive device(use of leg lifter and bed rails) Scooting to HOB: Independent with assistive device(bed rail) Transfers Transfers: Stand to Sit;Stand Pivot Transfers Sit to Stand: Independent with assistive device(with RW) Stand to Sit: Independent with assistive device(with RW) Stand Pivot Transfers: Independent with assistive device(with RW) Stand Pivot Transfer Details: (not needed unless pt is severely fatigues; often required end of session to remidn pt to slow down and bring L foot forward before standing) Transfer (Assistive device): Rolling walker Locomotion : Gait Ambulation: Yes Gait Assistance: Supervision/Verbal cueing(to facilitate use of Bioness; therapist provided min A overall) Gait Distance (Feet): 40 Feet Assistive device: Rolling walker;Other (Comment)(with L AFO and R heel lift) Gait Assistance Details: Verbal cues for gait pattern Gait Assistance Details: Patient ambulates with use of a Bioness for therapeutic gait with PT only up to 60 feet with min A. She ambulates up to 25 feet using a RW with a L AFO and R heel lift with mod I for household mobility and supervision up to 40 feet. Gait Gait: Yes Gait Pattern: Decreased step length - left;Decreased stance time - left;Step-to pattern;Step-through pattern;Decreased stride length;Decreased hip/knee flexion - left;Decreased dorsiflexion - left;Decreased weight shift to right;Left foot flat;Decreased trunk rotation;Lateral trunk lean to right;Trunk rotated posteriorly on left;Trunk flexed;Poor foot clearance - left Gait velocity: significantly decreased Wheelchair Mobility Wheelchair Mobility: Yes Wheelchair Assistance: Independent with assistive device Wheelchair Propulsion: Right upper extremity;Right lower extremity Wheelchair Parts Management: Independent(with increased time) Distance: 100 feet  Trunk/Postural Assessment :    Balance: Balance Balance Assessed:  Yes Static Sitting Balance Static Sitting - Balance  Support: Feet supported;No upper extremity supported Static Sitting - Level of Assistance: 6: Modified independent (Device/Increase time) Dynamic Sitting Balance Dynamic Sitting - Balance Support: No upper extremity supported;Feet supported Dynamic Sitting - Level of Assistance: 6: Modified independent (Device/Increase time) Dynamic Sitting - Balance Activities: Forward lean/weight shifting;Reaching for weighted objects;Reaching across midline Static Standing Balance Static Standing - Balance Support: Bilateral upper extremity supported;During functional activity Static Standing - Level of Assistance: 6: Modified independent (Device/Increase time) Dynamic Standing Balance Dynamic Standing - Balance Support: Bilateral upper extremity supported;During functional activity Dynamic Standing - Level of Assistance: 5: Stand by assistance Dynamic Standing - Balance Activities: Reaching across midline;Reaching for objects;Reaching for weighted objects Exercises:   Other Treatments:      Therapy/Group: Individual Therapy  Sharon Odom  Sharon Odom, PTA  04/15/2019, 3:27 PM

## 2019-04-16 ENCOUNTER — Other Ambulatory Visit: Payer: PRIVATE HEALTH INSURANCE

## 2019-04-16 NOTE — Progress Notes (Signed)
Pt discharged home with family. D/C instructions given by Linna Hoff, PA. No further questions from patient or family. Belongings and equipment were sent with patient. Patient is stable at time of D/C. Amanda Cockayne, LPN

## 2019-04-16 NOTE — Progress Notes (Signed)
PHYSICAL MEDICINE & REHABILITATION PROGRESS NOTE   Subjective/Complaints:  Nerves are still painful- just increased keppra to 1000 mg BID last night- going to have BMs only with suppositories- so need to do every QOD.    ROS: Patient denies fever, rash, sore throat, blurred vision, nausea, vomiting, diarrhea, cough, shortness of breath or chest pain, joint or back pain, headache, or mood change.     Objective:   No results found. Recent Labs    04/13/19 0942  WBC 9.0  HGB 14.3  HCT 43.9  PLT 248   No results for input(s): NA, K, CL, CO2, GLUCOSE, BUN, CREATININE, CALCIUM in the last 72 hours.  Intake/Output Summary (Last 24 hours) at 04/16/2019 0938 Last data filed at 04/16/2019 0740 Gross per 24 hour  Intake 360 ml  Output -  Net 360 ml     Physical Exam: Vital Signs Blood pressure 112/61, pulse (!) 59, temperature 97.9 F (36.6 C), temperature source Oral, resp. rate 16, height 5\' 9"  (1.753 m), weight 92.3 kg, SpO2 100 %. Constitutional: No distress . Vital signs reviewed.  Lying supine in bed; appears comfortable, NAD HEENT: EOMI, oral membranes moist Neck: supple Cardiovascular: RRR without murmur.     Respiratory: CTA Bilaterally without wheezes or rales. Normal effort    GI: BS +, non-tender, non-distended  Skin: Warm and dry.  Intact. Psych: Pleasant and friendly Musc: No edema in extremities.  No tenderness in extremities. Neurologic: Alert. Dysphonia Motor: RUE 5/5 proximal distal LUE: 4/5 proximal to distal RLE: 4+/5 proximal distal LLE: 2+/5 proximal to distal, unchanged Increased tone noted in left finger flexors and LE extensor tone--grossly 1+ to 2/4 11/16  Assessment/Plan: 1. Functional deficits secondary to MS exacerbation  which require 3+ hours per day of interdisciplinary therapy in a comprehensive inpatient rehab setting.  Physiatrist is providing close team supervision and 24 hour management of active medical problems listed  below.  Physiatrist and rehab team continue to assess barriers to discharge/monitor patient progress toward functional and medical goals  Care Tool:  Bathing    Body parts bathed by patient: Right arm, Left arm, Chest, Abdomen, Front perineal area, Buttocks, Right upper leg, Left upper leg, Right lower leg, Left lower leg, Face         Bathing assist Assist Level: Independent with assistive device     Upper Body Dressing/Undressing Upper body dressing   What is the patient wearing?: Pull over shirt, Bra    Upper body assist Assist Level: Independent with assistive device    Lower Body Dressing/Undressing Lower body dressing      What is the patient wearing?: Pants, Underwear/pull up     Lower body assist Assist for lower body dressing: Supervision/Verbal cueing((S)-CGA d/t fatigue)     Toileting Toileting    Toileting assist Assist for toileting: Independent with assistive device     Transfers Chair/bed transfer  Transfers assist     Chair/bed transfer assist level: Independent with assistive device Chair/bed transfer assistive device: Museum/gallery exhibitions officer assist      Assist level: Supervision/Verbal cueing Assistive device: Walker-rolling Max distance: 50 feet   Walk 10 feet activity   Assist     Assist level: Independent with assistive device Assistive device: Walker-rolling   Walk 50 feet activity   Assist Walk 50 feet with 2 turns activity did not occur: Safety/medical concerns  Assist level: Supervision/Verbal cueing Assistive device: Walker-rolling    Walk 150 feet activity  Assist Walk 150 feet activity did not occur: Safety/medical concerns(decreased strength/activity tolerance)         Walk 10 feet on uneven surface  activity   Assist Walk 10 feet on uneven surfaces activity did not occur: Safety/medical concerns(decreased strength/activity tolerance)         Wheelchair     Assist  Will patient use wheelchair at discharge?: Yes Type of Wheelchair: Manual Wheelchair activity did not occur: Safety/medical concerns  Wheelchair assist level: Independent Max wheelchair distance: 100'    Wheelchair 50 feet with 2 turns activity    Assist    Wheelchair 50 feet with 2 turns activity did not occur: Safety/medical concerns   Assist Level: Independent   Wheelchair 150 feet activity     Assist  Wheelchair 150 feet activity did not occur: Safety/medical concerns(decreased strength/activity tolerance)   Assist Level: Supervision/Verbal cueing   Blood pressure 112/61, pulse (!) 59, temperature 97.9 F (36.6 C), temperature source Oral, resp. rate 16, height 5\' 9"  (1.753 m), weight 92.3 kg, SpO2 100 %.  Medical Problem List and Plan: 1.  Decreased functional ability with increasing muscle weakness spasms and blurred vision secondary to MS exacerbation.  3 days IV Solu-Medrol completed.  Followed by Dr. neurology services outpatient maintained on [place] as welll as dalfampridine  Continue CIR PT, OT  2.  Antithrombotics: -DVT/anticoagulation: Lovenox.    Dopplers negative for DVT             -antiplatelet therapy: N/A 3. Pain Management/spasticity: Recent insertion of intrathecal baclofen pump 02/18/2019 for spasticity, baclofen 20 mg 3 times daily, Cymbalta 30 mg twice daily, Valium 5 mg every 8 hours as needed, Advil as needed as well as oxycodone  ITB increased by 15% on 11/10, patient states was decreased by 10% on admission--CURRENTLY AT 342.78mcg/day. Will d/w Dr. 26m re: further titration. Has significant tone on exam.  Trial of Keppra 500 twice daily for neuropathic pain, tolerating well so increased to 1000 BID on 11/18  WHO ordered for left hand--pt wearing each night  11/19- said ITB was increased again by 10% this week.  4. Mood: Wellbutrin 300 mg daily, Cymbalta 20mg  BID             -antipsychotic agents: N/A 5. Neuropsych: This patient is  capable of making decisions on her own behalf. 6. Skin/Wound Care: Routine skin checks 7. Fluids/Electrolytes/Nutrition: Routine in and outs  BMP within normal limits except for glucose on 11/9 8.  Asthma with history of tobacco abuse.  Continue nebulizers as needed as well as scheduled Singulair.  Controlled on 11/16 9.  GERD.  Pepcid 10. Neurogenic bowel with constipation- due to MS and ITB pump  Continue Miralax added Senokot-S 2 tabs qAM  Improving overall with bowel program--+BM 11/16  LBM 11/18 with suppository; should increase senokot every time gets ITB pump increase- 2 extra senokot in the 24 hours after pump increase, then go back to normal dose; also increase suppository to QOD. 11.  Hypoalbuminemia  Supplement initiated on 11/14 12. Steroid induced hyperglycemia  Cont to monitor 13. DME: 1) I am seeing patient for a Face to Face for an 12/18  2) I have reviewed and agree with the PT eval for 12/14.   LOS: 13 days A FACE TO FACE EVALUATION WAS PERFORMED  Kirti Carl 04/16/2019, 9:38 AM

## 2019-04-16 NOTE — Progress Notes (Signed)
Recreational Therapy Discharge Summary Patient Details  Name: [name] MRN: [id number] Date of Birth: July 01, 1970 Today's Date: 04/16/2019  Long term goals set: 1  Long term goals met: 0  Comments on progress toward goals: Pt has made good progress with therapies during LOS and is discharging home with husband to provide/coordinate 24 hour supervision/assistance.  TR sessions/education  focused on leisure assessment, activity analysis with potential adaptations, energy conservation and safety with tasks.  Community reintegration goal not attempted due to a last minute issue with ramp not being built and therapy session needed to focus on steps negotiation for home entry vs community skills w/c level.  However, I suspect pt would be Min assist level for community reintegration w/c level short distances as pt is limited by fatigue.   Reasons for discharge: discharge from hospital   Patient/family agrees with progress made and goals achieved: Yes  Zoriana Oats 04/16/2019, 8:45 AM

## 2019-04-16 NOTE — Progress Notes (Signed)
Social Work Discharge Note   The overall goal for the admission was met for:   Discharge location: Yes  Length of Stay: Yes  Discharge activity level: Yes  Home/community participation: Yes  Services provided included: MD, RD, PT, OT, RN, Pharmacy, Neuropsych and SW  Financial Services: Private Insurance: [employer] [place]  Follow-up services arranged: Home Health: PT, OT via [facility], DME: wheelchair, cushion, rollator walker via [facility] and Patient/Family has no preference for HH/DME agencies  Comments (or additional information):     Contact person, pt @ [phone]  Patient/Family verbalized understanding of follow-up arrangements: Yes  Individual responsible for coordination of the follow-up plan: pt  Confirmed correct DME delivered: Sharon Odom 04/16/2019    Gae Bihl

## 2019-04-16 NOTE — Plan of Care (Signed)
  Problem: Consults Goal: RH GENERAL PATIENT EDUCATION Description: See Patient Education module for education specifics. Outcome: Completed/Met   Problem: RH BOWEL ELIMINATION Goal: RH STG MANAGE BOWEL WITH ASSISTANCE Description: STG Manage Bowel with mod I Assistance. Outcome: Completed/Met Goal: RH STG MANAGE BOWEL W/MEDICATION W/ASSISTANCE Description: STG Manage Bowel with Medication with mod I Assistance. Outcome: Completed/Met   Problem: RH SAFETY Goal: RH STG ADHERE TO SAFETY PRECAUTIONS W/ASSISTANCE/DEVICE Description: STG Adhere to Safety Precautions With supervision Assistance/Device. Outcome: Completed/Met   Problem: RH PAIN MANAGEMENT Goal: RH STG PAIN MANAGED AT OR BELOW PT'S PAIN GOAL Description: Less than 4 on 0-10 scale  Outcome: Completed/Met   Problem: RH KNOWLEDGE DEFICIT GENERAL Goal: RH STG INCREASE KNOWLEDGE OF SELF CARE AFTER HOSPITALIZATION Description: Pt will be able to verbalize ways to manage pain and safety precautions to use at home with mod I assist prior to DC Outcome: Completed/Met

## 2019-04-17 DIAGNOSIS — F17210 Nicotine dependence, cigarettes, uncomplicated: Secondary | ICD-10-CM | POA: Diagnosis not present

## 2019-04-17 DIAGNOSIS — G8220 Paraplegia, unspecified: Secondary | ICD-10-CM | POA: Diagnosis not present

## 2019-04-17 DIAGNOSIS — H532 Diplopia: Secondary | ICD-10-CM | POA: Diagnosis not present

## 2019-04-17 DIAGNOSIS — J45909 Unspecified asthma, uncomplicated: Secondary | ICD-10-CM | POA: Diagnosis not present

## 2019-04-17 DIAGNOSIS — G35 Multiple sclerosis: Secondary | ICD-10-CM | POA: Diagnosis not present

## 2019-04-17 DIAGNOSIS — F419 Anxiety disorder, unspecified: Secondary | ICD-10-CM | POA: Diagnosis not present

## 2019-04-17 DIAGNOSIS — F329 Major depressive disorder, single episode, unspecified: Secondary | ICD-10-CM | POA: Diagnosis not present

## 2019-04-17 DIAGNOSIS — K5900 Constipation, unspecified: Secondary | ICD-10-CM | POA: Diagnosis not present

## 2019-04-17 DIAGNOSIS — K219 Gastro-esophageal reflux disease without esophagitis: Secondary | ICD-10-CM | POA: Diagnosis not present

## 2019-04-20 ENCOUNTER — Other Ambulatory Visit: Payer: Self-pay

## 2019-04-20 ENCOUNTER — Telehealth: Payer: Self-pay | Admitting: *Deleted

## 2019-04-20 ENCOUNTER — Telehealth: Payer: Self-pay | Admitting: Neurology

## 2019-04-20 ENCOUNTER — Encounter: Payer: Self-pay | Admitting: Neurology

## 2019-04-20 ENCOUNTER — Ambulatory Visit (INDEPENDENT_AMBULATORY_CARE_PROVIDER_SITE_OTHER): Payer: BC Managed Care – PPO | Admitting: Neurology

## 2019-04-20 VITALS — BP 135/86 | HR 81 | Temp 97.8°F | Ht 68.0 in | Wt 194.3 lb

## 2019-04-20 DIAGNOSIS — G35 Multiple sclerosis: Secondary | ICD-10-CM | POA: Diagnosis not present

## 2019-04-20 DIAGNOSIS — G8220 Paraplegia, unspecified: Secondary | ICD-10-CM

## 2019-04-20 DIAGNOSIS — R269 Unspecified abnormalities of gait and mobility: Secondary | ICD-10-CM | POA: Diagnosis not present

## 2019-04-20 DIAGNOSIS — M62838 Other muscle spasm: Secondary | ICD-10-CM

## 2019-04-20 NOTE — Telephone Encounter (Signed)
I reached out to Sharon Odom he is requesting the following physical therapy orders. 1 visit this week then 2 visits for four weeks then 1 visit for four weeks.   I provided verbal orders.

## 2019-04-20 NOTE — Telephone Encounter (Signed)
Lennette Bihari physical therapist  with [facility] called for verbal orders for patient. Please follow up.

## 2019-04-20 NOTE — Patient Instructions (Signed)
If Keppra does not help, we can consider Vimpat

## 2019-04-20 NOTE — Progress Notes (Signed)
[facility]  PATIENT: Sharon Odom DOB: 1971-01-25  REFERRING DOCTOR OR PCP:  Claretta Fraise SOURCE: Patient, notes imaging and lab reports, MRI images on CD from neurology,  _________________________________   HISTORICAL   CHIEF COMPLAINT:  Chief Complaint  Patient presents with   Follow-up    Rm 13  with husband here for f/u to discuss MRI readings and recent hsp stay was d/c from hsp on 04/16/2019     HISTORY OF PRESENT ILLNESS: Sharon Odom is a 48 y.o. woman with active/relapsing secondary progressive multiple sclerosis, left greater than right spasticity and poor gait.  Update 04/20/2019: She has a baclofen pump and the dose is being titrated up (440 mcg now, she believes; sees them again on Wednesday).   She denies any improvement in spasticity but feels he walking is worse.   A new brace adds weight and she has more trouble lifting her left leg.   She uses a walker to get out of a chair.      She feels spasms in her feet are better,   However, nerve pain seems worse.   She had been on gabapentin in the past and did not tolerate (can;t remember side effect).   Lyrica caused weight gain.   Lamotrigine was not wrll tolerated.   Keppra  She felt her neck was weaker and had trouble keeping hr head up.  Nystagmus has been more noticeable.   She feels her vision is more blurry.   She notes no asymmetry in color vision.     Her last Lemtrada was 10/19.   She ws recently hospitalized due to weakness.   With Solu-Medrol, she felt stronger and swallowing improved.   Her percocet was changed to oxycodone.  It helps the nerve pain.    Update 03/19/2019: Her baclofen trial went well.   She had the baclofen pump placed 02/18/2019 and dose is being increased (now 215 mcg/day).  Her legs are stiff.   She reports Dr. Ollen Bowl feels she might be having an exacerbation and has ordered MRI's.   She is taking baclofen pills 4 times a day (was taking 6 times a day).   Diazepam  has not helped the spasticity though helped the anxiety.     Currently she is having hand spasticity on the left and left > right leg spasticity.     She had her last Lemtrada October 2019 and we will try to get labwork form home   Update 12/15/2018: She has had Lemtrade x 3 courses for her MS.  Last dose was October 2019.   Lymphocyte count 11/2018 was 0.9.    She has no exacerbations.   She is noting more spasticity in her right leg than before and spasticity on the left is about the same.      She has severe spasticity and has tried Botox several courses without much benefit for the legs but she had some benefit in the left arm.   We discussed the baclofen pump.    Currently she is on baclofen 120 mg and valium 10mg  a day.   Tizanidine had not helped her in the past.    She is noting more fatigue.    She has some anxiety, helped by valium.     She also has dysesthesias that were better with Lyrica but she had weight gain and constipation and stopped.     She started Cymbalta.    Lamotrigine was not well tolerated.  Update 09/18/2018: She had her third year of Lemtrada in October 2019.  She tolerated it well.  CD4 count was 66 December 2019.  Most recent lymphocyte count is 0.6.  We had a conversation about COVID and [place].  As her lymphocytes are recovering, her risk is probably just slightly worse than it would be otherwise.  However, if the pandemic is still ongoing in a significant way in the late fall, we may want to push back any additional dosing.  She feels her MS is doing about the same with no new exacerbations though she has continued impairments, left > right.    She has severe spasticity and poor gait.     She had Botox 08/06/2018 (Dr. Terrace ArabiaYan) and she feel she got a good benefit at first with almost no spasms for a few days.   She is noting less stiffness in her thigh.  However, the spasticity in her lower leg and foot is not better. Calf still has severe spasticity.  She had 400  Units in the leg and 200 units in the arm.   She did not get a benefit in the arm.    She is having more pain in the left arm and leg, worse while sitting than standing.    Baclofen 120 mg daily is not helping.   She is also taking valium 10 mg -- this has helped anxiety more than spasticity. Her spasticity seemed to worsen a couple years ago.    She has dysesthesias, left > right.  Oxcarbaxepine and lamotrigine were poorly tolerated.  She has more fatigue and sleepiness.     She went to the ED 08/21/2018 due to shortness of breath and she was felt to be having severe seasonal allergies.   Update 04/14/2018: During REMS testing her creatinine level was elevated at 1.41 (it was 0.91 1 month earlier).   Of note, she reports that her creatinine has fluctuated over the years and that there was one other time when she was felt to have a high reading that was felt to be due to dehydration.  She has noted decreased oral liquid intake at times due to dysphasia.   We discussed possibility of an autoimmune kidney disease as that can be seen at about 1: 400 Lemtrada patients  Spasticity is her main issue.    The left leg, especially, spasms up with movements.   Botox had not had much of a benefit on the left and maybe a 25% benefit in the milder right.    Spasms are very painful.    She also has spasticity in the left arm.    Pain feels like the arm is being held tightly by someone.  The left leg does worse with activity so she can't exercise with it.     She has done PT in the past but it has not helped much.    She is having more trouble with basic ADL.   [facility] but it did not help that much.      She tires out easily with any activity.      She notes more trouble with swallowing liquids and is drinking less.     The problem is initiating the swallowing.     Hr sister notes that she seems to be having some hearing loss as she turns up TV louder and misses what people say at times.    Her anxiety  is worse and she is back on Xanax.  More spasticity in the left arm, especially after squeezing, she can't easily straighten the fingers back up  Update 01/23/2018: Her main problem continues to be spasticity in the legs, left greater than right.  Associated with the spasticity she has quite a bit of pain.  She felt she received benefit from the Botox injections into the legs for a few weeks but then the spasticity returned to the same level.  We discussed using a larger dose today and will consider increasing the dose further based on her response.  She is able to tolerate the baclofen better and has been able to increase the dose.  Gait is poor.  Ampyra seems to be helping a little bit.  She had 2 years of Lemtrada therapy, with the last dose about 15 months ago.  We are in the process of trying to get an additional year of [place] approved.    Update 10/16/2017: After the last visit, the baclofen dose was increased but she had trouble tolerating higher doses and did not think that her spasticity improved any.  She continues to report a lot of spasticity in both legs, left greater than right and in the left arm.  She denies spasticity in the right arm.  There continues to phasic spasms superimposed on the tonic spasms predominantly involving both feet and the left leg.  Gait is poor.  Around the home she will use a walker and can get from one room to another.   At the last visit I also started lamotrigine for her dysesthesias but it was poorly tolerated and she stopped.  Previously she had been unable to tolerate oxcarbazepine.  She has not yet restarted the Ampyra.  In the past, she had received Botox injections into muscles of the left lower leg and both feet.  This helped quite a bit with the first series but less so with the second series.  Since she has moved up to this area, she has not had any more Botox injections.  She had her first course of Lemtrada April 2017 and the second course  of April 2018.  She has noted some further progression over the last year and we have discussed an additional third course.  She had shingles earlier this year.  Pain has resolved.  From 08/15/2017:  She was diagnosed in October 2013 due to difficulty with her handwriting and right hand coordination.   A few weeks later she had gait ataxia and numbness and was referred to MS.   She had an MRI of the brain (was in [place] at the time) and then had a LP confirming the diagnosis.  Initially, she was placed on Copaxone but had severe frequent exacerbations and then went to Tecfidera due to breakthrough exacerbations.  She was referred to [facility] for Tysabri (Feb 2014 to November 2016).  She then moved to [facility] and saw Dr. Ilene Qua [facility] [phone]).   She was always JCV Ab titer positive.    She did well on it but stopped 03/2015 due to safety risk.  She had more spasticity early 2017 that improved with 5 days IV Solumedrol and then started [facility] April 2017.   The infusion went well.    She started having more spasms in her legs later in 2017 and had Botox injections into her feet (some benefit but only for 3-4 weeks).   She had a second course of Botox November 2017 without much benefit.     She had numbness and tingling in  her left arm November 2017 and was given an ESI.   Medical MJ did not help.   She had her second course of Lemtrada in April 2018 but the steroid did not help her spasticity as it did the first year.    [facility] and also saw Dr. Aquilla HackerGhobrial at [facility].  Currently,   She uses a walker (rarely cane) around her home and is wheeled outside the house.   Earlier this year, she had worsening spasticity with toes very spastic and legs locking up.    The severe leg spasms have improved but she still has spasms in her feet that are severe and painful.   Spasticity is worse on the left (foot, leg and arm) and only in her foot on  the right.    She takes 20 mg po qid.   Tizanidine did not help much.   She has some painful dysesthesia but gabapentin had not helped.   She continues to have a lot of pain, worse on her left.     Her left arm is also weak and she can't do many self care tasks (dressing, etc).    Due to the pain, she was drinking alcohol (2 - 3 drinks).   Clonazepam was tried but doses were low.   She was on Ampyra in the past 2014-2017 but felt it wasn't working and stopped.   She was on both Ampyra and Wellbutrin for at least one year and there were no seizures.     She has urinary frequency and urgency, helped by Myrbetriq.   Sometimes due to her gait, she cannot get to the bathroom in time.    She had diplopia during 1 of her exacerbations a few years ago.  She continues to have some double vision though she is doing better now.    She never had optic neuritis.    Wellbutrin helped her mood better initially and she is very frustrated and irritable.   She was diagnosed with PBA in May 2018 and started on Nuedexta.   She did better mood-wise but spasticity was worse and she stopped.       She feels cognitive skills are mildly worse with mild short term memory issues and decreased focus and attention.     I have reviewed the MRI of the brain and cervical spine dated 03/19/2017.  The MRI of the brain shows multiple T2/FLAIR hyperintense foci in the cerebellum, pons, left greater than right thalamus and in the periventricular, juxtacortical and deep white matter of both hemispheres.    The MRI of the cervical spine showed a subtle focus Adjacent to C4-C5.  She has a disc protrusion at [facility].  REVIEW OF SYSTEMS: Constitutional: No fevers, chills, sweats, or change in appetite.  She reports fatigue. Eyes: As above Ear, nose and throat: No hearing loss, ear pain, nasal congestion, sore throat Cardiovascular: No chest pain, palpitations Respiratory: No shortness of breath at rest or with exertion.   No  wheezes GastrointestinaI: No nausea, vomiting, diarrhea, abdominal pain, fecal incontinence Genitourinary:As above  musculoskeletal:She has pain in her legs, left greater than right due to spasticity.  She notes mild back pain. Integumentary: No rash, pruritus, skin lesions Neurological: as above Psychiatric: As above Endocrine: No palpitations, diaphoresis, change in appetite, change in weigh or increased thirst Hematologic/Lymphatic: No anemia, purpura, petechiae. Allergic/Immunologic: No itchy/runny eyes, nasal congestion, recent allergic reactions, rashes  ALLERGIES: Allergies  Allergen  Reactions   Morphine Other (See Comments)    HOME MEDICATIONS:  Current Outpatient Medications:    albuterol (VENTOLIN HFA) 108 (90 Base) MCG/ACT inhaler, Inhale 1-2 puffs into the lungs every 6 (six) hours as needed for wheezing or shortness of breath., Disp: 6.7 g, Rfl: 1   Alemtuzumab (LEMTRADA) 12 MG/1.2ML SOLN, Inject 12 mg into the vein See admin instructions. Once a year, Disp: , Rfl:    baclofen (LIORESAL) 20 MG tablet, Take 1 tablet (20 mg total) by mouth 3 (three) times daily. (Patient taking differently: Take 20 mg by mouth 3 (three) times daily. 1-4 times per day), Disp: 90 each, Rfl: 0   bisacodyl (DULCOLAX) 10 MG suppository, Place 1 suppository (10 mg total) rectally daily as needed for moderate constipation., Disp: 12 suppository, Rfl: 0   buPROPion (WELLBUTRIN XL) 300 MG 24 hr tablet, Take 1 tablet (300 mg total) by mouth daily., Disp: 30 tablet, Rfl: 1   CASCARA SAGRADA PO, Take by mouth., Disp: , Rfl:    Cholecalciferol (VITAMIN D-3) 125 MCG (5000 UT) TABS, Take 5,000 Units by mouth daily., Disp: 30 tablet, Rfl: 1   Cyanocobalamin (VITAMIN B-12) 2500 MCG SUBL, Place 1 tablet (2,500 mcg total) under the tongue daily., Disp: 30 tablet, Rfl: 0   dalfampridine 10 MG TB12, One po bid (Patient taking differently: Take 10 mg by mouth 2 (two) times daily. One po bid), Disp: 60  tablet, Rfl: 11   diazepam (VALIUM) 5 MG tablet, Take 1 tablet (5 mg total) by mouth every 8 (eight) hours as needed for muscle spasms., Disp: 30 tablet, Rfl: 0   DULoxetine (CYMBALTA) 30 MG capsule, Take 1 capsule (30 mg total) by mouth 2 (two) times daily., Disp: 60 capsule, Rfl: 3   levETIRAcetam (KEPPRA) 1000 MG tablet, Take 1 tablet (1,000 mg total) by mouth 2 (two) times daily., Disp: 60 tablet, Rfl: 1   montelukast (SINGULAIR) 10 MG tablet, Take 1 tablet (10 mg total) by mouth daily., Disp: 30 tablet, Rfl: 1   oxybutynin (DITROPAN-XL) 10 MG 24 hr tablet, Take 1 tablet (10 mg total) by mouth daily., Disp: 30 tablet, Rfl: 1   polyethylene glycol (MIRALAX / GLYCOLAX) 17 g packet, Take 17 g by mouth daily., Disp: 14 each, Rfl: 0   ranitidine (ZANTAC) 300 MG tablet, Take 1 tablet (300 mg total) by mouth daily as needed for heartburn., Disp: 30 tablet, Rfl: 0   oxyCODONE 10 MG TABS, Take 1 tablet (10 mg total) by mouth every 4 (four) hours as needed for severe pain. (Patient not taking: Reported on 04/20/2019), Disp: 30 tablet, Rfl: 0  PAST MEDICAL HISTORY: Past Medical History:  Diagnosis Date   Allergies    Anxiety    Arthritis    Asthma    Constipation    Depression    GERD (gastroesophageal reflux disease)    Headache    History of hiatal hernia    Multiple sclerosis (HCC)    Vision abnormalities    Wears glasses     PAST SURGICAL HISTORY:   FAMILY HISTORY: Family History  Problem Relation Age of Onset   Diabetes Mother    Healthy Brother     SOCIAL HISTORY:  Social History   Socioeconomic History   Marital status: Married    Spouse name: Not on file   Number of children: Not on file   Years of education: Not on file   Highest education level: Not on file  Occupational History   Not  on file  Social Needs   Financial resource strain: Not on file   Food insecurity    Worry: Never true    Inability: Never true   Transportation  needs    Medical: No    Non-medical: No  Tobacco Use   Smoking status: Current Every Day Smoker    Packs/day: 0.50    Types: Cigarettes   Smokeless tobacco: Never Used  Substance and Sexual Activity   Alcohol use: Yes    Comment: 2-3 times per wk   Drug use: Never   Sexual activity: Not Currently  Lifestyle   Physical activity    Days per week: 0 days    Minutes per session: 0 min   Stress: Not at all  Relationships   Social connections    Talks on phone: Not on file    Gets together: Not on file    Attends religious service: Not on file    Active member of club or organization: Not on file    Attends meetings of clubs or organizations: Not on file    Relationship status: Not on file   Intimate partner violence    Fear of current or ex partner: No    Emotionally abused: No    Physically abused: No    Forced sexual activity: No  Other Topics Concern   Not on file  Social History Narrative   Right handed    Caffeine: 1 cup or less per day- coffee   [employer]     PHYSICAL EXAM  Vitals:   04/20/19 0907  BP: 135/86  Pulse: 81  Temp: 97.8 F (36.6 C)  TempSrc: Temporal  Weight: 194 lb 5 oz (88.1 kg)  Height:  (1.727 m)    Body mass index is 29.55 kg/m.   General: The patient is well-developed and well-nourished and in no acute distress  Neurologic Exam  Mental status: The patient is alert and oriented x 3 at the time of the examination. The patient has apparent normal recent and remote memory, with an apparently normal attention span and concentration ability.   Speech is normal.  Cranial nerves: Bilateral INO's, right greater than left.  Facial strength and sensation is normal.  The tongue is midline, and the patient has symmetric elevation of the soft palate. No obvious hearing deficits are noted.  Motor:   Spasticity is a little better in the legs.  There were no flexion spasms.    She has moderate increased muscle tone in the left arm.   The right arm has normal muscle tone.  Strength is 4 / 5 in the left arm, 3/5 in the left leg and 4- to 4/5 in the right leg .     Sensory: Normal sensation to touch and vibration in the arms.  She has reduced sensation to vibration in the left leg. Coordination: She has fairly normal right finger-nose-finger but reduced left finger-nose-finger.  Poor heel-to-shin on the right and she cannot do on the left.  Gait and station: She is in a wheelchair needs support to stand up..    Reflexes: Deep tendon reflexes are now 1+ at the knees.  No clonus at the ankles    ASSESSMENT AND PLAN     1. Multiple sclerosis exacerbation (HCC)   2. Spastic diplegia, acquired, lower extremity (HCC)   3. Muscle spasticity   4. Gait disturbance      1.  She has had 3 courses of Lemtrada for her MS.  We will do an additional 3-day course this year.  She has been compliant with the rems program. 2.  She has less foot spasms so is getting some benefit from baclofen pump.   She still has spasms higher up in the leg and in the left arm.  For the spasticity in the left arm she sees Dr. Krista Blue for Botox.  3.   I will write for oxycodone 10 mg once or twice a day. 4.   If Keppra is not helping, consider Vimpat or retry gabapentin.    Dr. Dagoberto Ligas (PMR) is working with her as well for rehab and pain issues.     5.   Continue other medications Follow-up in 4 months or sooner if there are new or worsening neurologic symptoms.  45 minute face-to-face evaluation with greater than one half of the time counseling and coordinating care related to her new symptoms, recent hospitalization and spasticity from the MS.  Lanetra Hartley A. Felecia Shelling, MD, [facility] 27/25/3664, 4:03 PM Certified in Neurology, Clinical Neurophysiology, Sleep Medicine, Pain Medicine and Neuroimaging  [facility] [address], [place], [place] [address] [phone]

## 2019-04-20 NOTE — Telephone Encounter (Signed)
Transitional Care call 1st attempt.  Patient's listed phone number [phone]  Straight to voicemail, Left message to call our office back

## 2019-04-22 DIAGNOSIS — Z9689 Presence of other specified functional implants: Secondary | ICD-10-CM | POA: Diagnosis not present

## 2019-04-22 DIAGNOSIS — G35 Multiple sclerosis: Secondary | ICD-10-CM | POA: Diagnosis not present

## 2019-04-22 DIAGNOSIS — R030 Elevated blood-pressure reading, without diagnosis of hypertension: Secondary | ICD-10-CM | POA: Diagnosis not present

## 2019-04-22 DIAGNOSIS — M62838 Other muscle spasm: Secondary | ICD-10-CM | POA: Diagnosis not present

## 2019-04-22 NOTE — Telephone Encounter (Signed)
Transitional care call 2nd attempt  Went to voicemail again

## 2019-04-24 DIAGNOSIS — H532 Diplopia: Secondary | ICD-10-CM | POA: Diagnosis not present

## 2019-04-24 DIAGNOSIS — G8220 Paraplegia, unspecified: Secondary | ICD-10-CM | POA: Diagnosis not present

## 2019-04-24 DIAGNOSIS — F17210 Nicotine dependence, cigarettes, uncomplicated: Secondary | ICD-10-CM | POA: Diagnosis not present

## 2019-04-24 DIAGNOSIS — F419 Anxiety disorder, unspecified: Secondary | ICD-10-CM | POA: Diagnosis not present

## 2019-04-24 DIAGNOSIS — G35 Multiple sclerosis: Secondary | ICD-10-CM | POA: Diagnosis not present

## 2019-04-24 DIAGNOSIS — K5900 Constipation, unspecified: Secondary | ICD-10-CM | POA: Diagnosis not present

## 2019-04-24 DIAGNOSIS — F329 Major depressive disorder, single episode, unspecified: Secondary | ICD-10-CM | POA: Diagnosis not present

## 2019-04-24 DIAGNOSIS — J45909 Unspecified asthma, uncomplicated: Secondary | ICD-10-CM | POA: Diagnosis not present

## 2019-04-24 DIAGNOSIS — K219 Gastro-esophageal reflux disease without esophagitis: Secondary | ICD-10-CM | POA: Diagnosis not present

## 2019-04-25 DIAGNOSIS — G894 Chronic pain syndrome: Secondary | ICD-10-CM | POA: Diagnosis not present

## 2019-04-25 DIAGNOSIS — M6281 Muscle weakness (generalized): Secondary | ICD-10-CM | POA: Diagnosis not present

## 2019-04-25 DIAGNOSIS — D849 Immunodeficiency, unspecified: Secondary | ICD-10-CM | POA: Diagnosis not present

## 2019-04-25 DIAGNOSIS — R252 Cramp and spasm: Secondary | ICD-10-CM | POA: Diagnosis not present

## 2019-04-27 ENCOUNTER — Other Ambulatory Visit: Payer: Self-pay

## 2019-04-27 ENCOUNTER — Encounter: Payer: Self-pay | Admitting: Physical Medicine and Rehabilitation

## 2019-04-27 ENCOUNTER — Encounter
Payer: BC Managed Care – PPO | Attending: Physical Medicine and Rehabilitation | Admitting: Physical Medicine and Rehabilitation

## 2019-04-27 VITALS — BP 124/80 | HR 73 | Temp 97.5°F | Ht 68.0 in | Wt 194.0 lb

## 2019-04-27 DIAGNOSIS — K5903 Drug induced constipation: Secondary | ICD-10-CM | POA: Diagnosis not present

## 2019-04-27 DIAGNOSIS — J45909 Unspecified asthma, uncomplicated: Secondary | ICD-10-CM | POA: Diagnosis not present

## 2019-04-27 MED ORDER — FLUTICASONE FUROATE-VILANTEROL 200-25 MCG/INH IN AEPB: 1.0000 | INHALATION_SPRAY | Freq: Every morning | RESPIRATORY_TRACT | 0 refills | Status: DC

## 2019-04-27 MED ORDER — SENNOSIDES-DOCUSATE SODIUM 8.6-50 MG PO TABS: 1.0000 | ORAL_TABLET | Freq: Every day | ORAL | 0 refills | Status: DC

## 2019-04-27 NOTE — Progress Notes (Signed)
Subjective:    Patient ID: Sharon Odom, female    DOB: Feb 27, 1971, 48 y.o.   MRN: [id number]  HPI Sharon Odom is a 48 year old woman who presents for hospital follow-up after experiencing an MS exacerbation.  She continues to have left-sided arm pain that extends into her fingers. Her arm feels heavy and with painful numbness and tingling. She continues on the Keppra 1000mg  BID but has not felt benefit yet. Her pain physician advised her to wait 4-6 weeks to see an effect, and to consider Vimpat if the Keppra does not appear to be helping. She does not want to take Lyrica because it made her gain weight and she does not want to try Gabapentin since it is related to Lyrica. She is on the max dose of Cymbalta and has never tried Amitriptyline. She is also taking Percocet 10mg  2-3 times per day and asks about continuing this.   She has mostly been having a BM every day but this week needed a suppository and had diarrhea afterward. She never received Senna-Docusate in her pharmacy.   She has been very tired in the mornings. She is working with PT and has walked with them within her home. Without them, she uses her wheelchair within her home but stands when she needs to use the bathroom sink. She is still using the bedside commode as was recommended by PT to gradually return to walking.   Her baclofen pump dose was increased this week and that has helped with her pain. She felt very good two days after this change but feels very tired today. She thinks this is related to the weather.   She says she was on Symbicort in the past and this helped her but that it was not covered when she went to pick it up from the pharmacy. She also takes montelukast daily and has an albuterol inhaler.   Pain Inventory Average Pain 5 Pain Right Now 8 My pain is constant, sharp, burning, stabbing, tingling and aching  In the last 24 hours, has pain interfered with the following? General activity 2 Relation with others  10 Enjoyment of life 6 What TIME of day is your pain at its worst? morning, daytime, evening Sleep (in general) Good  Pain is worse with: walking, bending, sitting and standing Pain improves with: rest, therapy/exercise, pacing activities, medication, TENS and injections Relief from Meds: 7  Mobility walk with assistance use a walker ability to climb steps?  yes do you drive?  no use a wheelchair needs help with transfers Do you have any goals in this area?  yes  Function disabled: date disabled . I need assistance with the following:  dressing, meal prep, household duties and shopping  Neuro/Psych bladder control problems bowel control problems weakness numbness tremor tingling trouble walking spasms confusion depression anxiety  Physicians involved in your care Any changes since last visit?  no hospital f/u   Family History  Problem Relation Age of Onset  . Diabetes Mother   . Healthy Brother    Social History   Socioeconomic History  . Marital status: Married    Spouse name: Not on file  . Number of children: Not on file  . Years of education: Not on file  . Highest education level: Not on file  Occupational History  . Not on file  Social Needs  . Financial resource strain: Not on file  . Food insecurity    Worry: Never true    Inability: Never true  .  Transportation needs    Medical: No    Non-medical: No  Tobacco Use  . Smoking status: Current Every Day Smoker    Packs/day: 0.50    Types: Cigarettes  . Smokeless tobacco: Never Used  Substance and Sexual Activity  . Alcohol use: Yes    Comment: 2-3 times per wk  . Drug use: Never  . Sexual activity: Not Currently  Lifestyle  . Physical activity    Days per week: 0 days    Minutes per session: 0 min  . Stress: Not at all  Relationships  . Social Herbalist on phone: Not on file    Gets together: Not on file    Attends religious service: Not on file    Active member of  club or organization: Not on file    Attends meetings of clubs or organizations: Not on file    Relationship status: Not on file  Other Topics Concern  . Not on file  Social History Narrative   Right handed    Caffeine: 1 cup or less per day- coffee   [employer]   Past Surgical History:  Procedure Laterality Date  . EXCISION MORTON'S NEUROMA Right   . fallopian tube removal    . INTRATHECAL PUMP IMPLANT Right 02/18/2019   Procedure: INTRATHECAL PUMP IMPLANT;  Surgeon: Clydell Hakim, MD;  Location: [facility];  Service: Neurosurgery;  Laterality: Right;  INTRATHECAL PUMP IMPLANT  . WISDOM TOOTH EXTRACTION     Past Medical History:  Diagnosis Date  . Allergies   . Anxiety   . Arthritis   . Asthma   . Constipation   . Depression   . GERD (gastroesophageal reflux disease)   . Headache   . History of hiatal hernia   . Multiple sclerosis ([place])   . Vision abnormalities   . Wears glasses    BP 124/80   Pulse 73   Temp (!) 97.5 F (36.4 C)   Ht 5\' 8"  (1.727 m) Comment: previously  Wt 194 lb (88 kg) Comment: previously  SpO2 95%   BMI 29.50 kg/m   Opioid Risk Score:   Fall Risk Score:  `1  Depression screen PHQ 2/9  Depression screen PHQ 2/9 04/27/2019  Decreased Interest 1  Down, Depressed, Hopeless 1  PHQ - 2 Score 2  Altered sleeping 1  Tired, decreased energy 1  Change in appetite 1  Feeling bad or failure about yourself  0  Trouble concentrating 1  Moving slowly or fidgety/restless 2  Suicidal thoughts 0  PHQ-9 Score 8  Difficult doing work/chores Very difficult    Review of Systems  Constitutional: Negative.   HENT: Negative.   Eyes: Negative.   Respiratory: Positive for shortness of breath and wheezing.   Cardiovascular: Negative.   Gastrointestinal: Positive for constipation, diarrhea, nausea and vomiting.  Genitourinary: Positive for difficulty urinating.  Musculoskeletal: Positive for arthralgias, gait problem and myalgias.  Allergic/Immunologic:  Negative.   Neurological: Positive for tremors, weakness and numbness.       Tingling   Hematological: Negative.   Psychiatric/Behavioral: Positive for confusion and dysphoric mood. The patient is nervous/anxious.   All other systems reviewed and are negative.      Objective:   Physical Exam  Gen: no distress, fatigued HEENT: oral mucosa pink and moist, NCAT Cardio: Reg rate Chest: normal effort, normal rate of breathing Abd: soft, non-distended, obese Ext: no edema Skin: intact Neuro: AOx3. Normal speech and memory.  Musculoskeletal: LUE: 4/5  LLE: 3/5 RUE: 5/5 RLE: 4/5 Sensation intact Sitting in wheelchair Psych: pleasant, normal affect      Assessment & Plan:  Mrs. Bosarge presents for hospital follow-up following MS exacerbation.  Neuropathic pain: -Improved with her recent increase in Baclofen pump dosage.  -She is currently on Keppra 1000 BID and Cymbalta 30mg  BID. Continue these doses. If Keppra fails to improve pain, can consider Vimpat. She does not want Lyrica or Gabapentin since these have caused her to gain weight in the past. -She is also taking Percocet BID as prescribed by Dr. Epimenio Foot. Advised her that our goal will be for her to transition of the Percocet, which she is taking 2-3 times per day. Advised that she can continue to take 1-2 doses of Tylenol on top of this to help with pain. Goal is for her to decrease Percocet dose to two pills per day this week, then 1 pill per day, and then to complete the course. Advised of the addictive potential of opioids and the potential for rebound pain and tolerance.   Constipation: -Prescribed Senna-Docusate and advised that she take daily with her Miralax. If she continues to feel constipation, she can also take 1 dose of Senna-Docusate at night.  Asthma: -Since Symbicort was not covered for her, I prescribed the generic form of Breo and advised that she can take 1 puff every morning. Advised that the Albuterol should  only be used as needed.  Exercise: -Recommended that she performs sit-to-stands every hour to decrease the risk of pressure injury and to offload her spine. As she is able to tolerate greater exercise, we will aim for her to increase her walking.  Forty-five minutes of face to face patient care time were spent during this visit. All questions were encouraged and answered. Follow up with me in 6 weeks.

## 2019-04-28 NOTE — Telephone Encounter (Signed)
Patient seen in clinic 04/27/2019 

## 2019-04-30 DIAGNOSIS — H524 Presbyopia: Secondary | ICD-10-CM | POA: Diagnosis not present

## 2019-04-30 DIAGNOSIS — H5123 Internuclear ophthalmoplegia, bilateral: Secondary | ICD-10-CM | POA: Diagnosis not present

## 2019-05-05 ENCOUNTER — Other Ambulatory Visit: Payer: Self-pay

## 2019-05-05 ENCOUNTER — Ambulatory Visit (INDEPENDENT_AMBULATORY_CARE_PROVIDER_SITE_OTHER): Payer: BC Managed Care – PPO | Admitting: Otolaryngology

## 2019-05-05 ENCOUNTER — Encounter (INDEPENDENT_AMBULATORY_CARE_PROVIDER_SITE_OTHER): Payer: Self-pay | Admitting: Otolaryngology

## 2019-05-05 VITALS — Temp 98.1°F

## 2019-05-05 DIAGNOSIS — J342 Deviated nasal septum: Secondary | ICD-10-CM

## 2019-05-05 DIAGNOSIS — J310 Chronic rhinitis: Secondary | ICD-10-CM

## 2019-05-05 NOTE — Progress Notes (Signed)
HPI: Sharon Odom is a 48 y.o. female who presents for evaluation of chronic nasal obstruction.  Patient has history of multiple sclerosis and has had recent exacerbation requiring hospitalization.  She has always had trouble breathing through her nose especially on the right side.  She uses Afrin daily in order to breathe.  The right side is much worse than the left side.  She has tried Breathe Right strips previously that seemed to help initially but have not helped recently.  She does have history of allergies.  Past Medical History:  Diagnosis Date  . Allergies   . Anxiety   . Arthritis   . Asthma   . Constipation   . Depression   . GERD (gastroesophageal reflux disease)   . Headache   . History of hiatal hernia   . Multiple sclerosis ([place])   . Vision abnormalities   . Wears glasses    Past Surgical History:  Procedure Laterality Date  . EXCISION MORTON'S NEUROMA Right   . fallopian tube removal    . INTRATHECAL PUMP IMPLANT Right 02/18/2019   Procedure: INTRATHECAL PUMP IMPLANT;  Surgeon: Clydell Hakim, MD;  Location: [facility];  Service: Neurosurgery;  Laterality: Right;  INTRATHECAL PUMP IMPLANT  . WISDOM TOOTH EXTRACTION     Social History   Socioeconomic History  . Marital status: Married    Spouse name: Not on file  . Number of children: Not on file  . Years of education: Not on file  . Highest education level: Not on file  Occupational History  . Not on file  Social Needs  . Financial resource strain: Not on file  . Food insecurity    Worry: Never true    Inability: Never true  . Transportation needs    Medical: No    Non-medical: No  Tobacco Use  . Smoking status: Current Every Day Smoker    Packs/day: 0.50    Years: 32.00    Pack years: 16.00    Types: Cigarettes    Start date: 67  . Smokeless tobacco: Never Used  Substance and Sexual Activity  . Alcohol use: Yes    Comment: 2-3 times per wk  . Drug use: Never  . Sexual activity: Not Currently   Lifestyle  . Physical activity    Days per week: 0 days    Minutes per session: 0 min  . Stress: Not at all  Relationships  . Social Herbalist on phone: Not on file    Gets together: Not on file    Attends religious service: Not on file    Active member of club or organization: Not on file    Attends meetings of clubs or organizations: Not on file    Relationship status: Not on file  Other Topics Concern  . Not on file  Social History Narrative   Right handed    Caffeine: 1 cup or less per day- coffee   [employer]   Family History  Problem Relation Age of Onset  . Diabetes Mother   . Healthy Brother    Allergies  Allergen Reactions  . Morphine Other (See Comments)   Prior to Admission medications   Medication Sig Start Date End Date Taking? Authorizing Provider  albuterol (VENTOLIN HFA) 108 (90 Base) MCG/ACT inhaler Inhale 1-2 puffs into the lungs every 6 (six) hours as needed for wheezing or shortness of breath. 04/15/19  Yes Angiulli, Lavon Paganini, PA-C  Alemtuzumab (LEMTRADA) 12 MG/1.2ML SOLN Inject 12  mg into the vein See admin instructions. Once a year   Yes [provider]  baclofen (LIORESAL) 20 MG tablet Take 1 tablet (20 mg total) by mouth 3 (three) times daily. Patient taking differently: Take 20 mg by mouth 3 (three) times daily. 1-4 times per day 04/15/19  Yes Angiulli, Mcarthur Rossetti, PA-C  bisacodyl (DULCOLAX) 10 MG suppository Place 1 suppository (10 mg total) rectally daily as needed for moderate constipation. 04/15/19  Yes Angiulli, Mcarthur Rossetti, PA-C  buPROPion (WELLBUTRIN XL) 300 MG 24 hr tablet Take 1 tablet (300 mg total) by mouth daily. 04/15/19  Yes Angiulli, Mcarthur Rossetti, PA-C  CASCARA SAGRADA PO Take by mouth.   Yes [provider]  Cholecalciferol (VITAMIN D-3) 125 MCG (5000 UT) TABS Take 5,000 Units by mouth daily. 04/15/19  Yes Angiulli, Mcarthur Rossetti, PA-C  Cyanocobalamin (VITAMIN B-12) 2500 MCG SUBL Place 1 tablet (2,500 mcg total) under the  tongue daily. 04/15/19  Yes Angiulli, Mcarthur Rossetti, PA-C  dalfampridine 10 MG TB12 One po bid Patient taking differently: Take 10 mg by mouth 2 (two) times daily. One po bid 09/18/18  Yes Sater, [name], MD  diazepam (VALIUM) 5 MG tablet Take 1 tablet (5 mg total) by mouth every 8 (eight) hours as needed for muscle spasms. 04/15/19  Yes Angiulli, Mcarthur Rossetti, PA-C  DULoxetine (CYMBALTA) 30 MG capsule Take 1 capsule (30 mg total) by mouth 2 (two) times daily. 04/15/19  Yes Angiulli, Mcarthur Rossetti, PA-C  fluticasone furoate-vilanterol (BREO ELLIPTA) 200-25 MCG/INH AEPB Inhale 1 puff into the lungs every morning. 04/27/19  Yes Raulkar, Drema Pry, MD  levETIRAcetam (KEPPRA) 1000 MG tablet Take 1 tablet (1,000 mg total) by mouth 2 (two) times daily. 04/15/19  Yes Angiulli, Mcarthur Rossetti, PA-C  montelukast (SINGULAIR) 10 MG tablet Take 1 tablet (10 mg total) by mouth daily. 04/15/19  Yes Angiulli, Mcarthur Rossetti, PA-C  oxybutynin (DITROPAN-XL) 10 MG 24 hr tablet Take 1 tablet (10 mg total) by mouth daily. 04/15/19  Yes Angiulli, Mcarthur Rossetti, PA-C  oxyCODONE 10 MG TABS Take 1 tablet (10 mg total) by mouth every 4 (four) hours as needed for severe pain. 04/15/19  Yes Angiulli, Mcarthur Rossetti, PA-C  polyethylene glycol (MIRALAX / GLYCOLAX) 17 g packet Take 17 g by mouth daily. 04/16/19  Yes Angiulli, Mcarthur Rossetti, PA-C  ranitidine (ZANTAC) 300 MG tablet Take 1 tablet (300 mg total) by mouth daily as needed for heartburn. 04/15/19  Yes Angiulli, Mcarthur Rossetti, PA-C  senna-docusate (SENOKOT-S) 8.6-50 MG tablet Take 1 tablet by mouth daily. 04/27/19  Yes Raulkar, Drema Pry, MD     Positive ROS: She has multiple complications with MS  All other systems have been reviewed and were otherwise negative with the exception of those mentioned in the HPI and as above.  Physical Exam: Constitutional: Alert, well-appearing, no acute distress.  She is confined to a wheelchair and has trouble with mobilization. Ears: External ears without lesions or  tenderness. Ear canals are clear bilaterally with intact, clear TMs.  Nasal: External nose without lesions.  She does have a slightly pinched or narrowed nose in the region of the lower lateral cartilage.  She also has a nasal piercing on the right side but this does not obstruct the airway.  Septum with moderate severe deviation to the right.  She has moderate rhinitis with findings consistent with a rhinitis medicamentosa..  No polyps noted.  Middle meatus regions were clear. Oral: Lips and gums without lesions. Tongue and palate mucosa without  lesions. Posterior oropharynx clear. Neck: No palpable adenopathy or masses Respiratory: Breathing comfortably  Skin: No facial/neck lesions or rash noted.  Procedures  Assessment: Nasal obstruction secondary to septal deviation as well as chronic rhinitis with chronic Afrin use and rhinitis medicamentosa.  Plan: Would initially recommend stopping use of Afrin and use of nasal steroid spray suggested Nasacort 2 sprays each nostril at night on a regular basis and use of saline irrigation.  Also gave her some information on use of Airmax nasal device that will help support the lateral nasal wall and improve breathing. If she does not get adequate relief with this therapy could possibly consider septoplasty and turbinate reductions which would offer her better long-term relief but would require surgery.  They will try the medical therapy initially and contact us in 1 month if they decide to pursue surgical intervention.  Narda Bonds, MD

## 2019-05-06 DIAGNOSIS — M62838 Other muscle spasm: Secondary | ICD-10-CM | POA: Diagnosis not present

## 2019-05-06 DIAGNOSIS — G35 Multiple sclerosis: Secondary | ICD-10-CM | POA: Diagnosis not present

## 2019-05-06 DIAGNOSIS — Z6828 Body mass index (BMI) 28.0-28.9, adult: Secondary | ICD-10-CM | POA: Diagnosis not present

## 2019-05-06 DIAGNOSIS — Z9689 Presence of other specified functional implants: Secondary | ICD-10-CM | POA: Diagnosis not present

## 2019-05-12 ENCOUNTER — Telehealth: Payer: Self-pay | Admitting: Neurology

## 2019-05-12 NOTE — Telephone Encounter (Signed)
Printed message and gave to infusion suite to f/u on

## 2019-05-12 NOTE — Telephone Encounter (Signed)
[facility] called to verify if the summary of benfits has been received for the patient.    Would also like to check on the PA for Alemtuzumab (LEMTRADA) 12 MG/1.2ML SOLN   Please FU

## 2019-05-13 ENCOUNTER — Encounter: Payer: Self-pay | Admitting: *Deleted

## 2019-05-13 DIAGNOSIS — G35 Multiple sclerosis: Secondary | ICD-10-CM | POA: Insufficient documentation

## 2019-05-14 ENCOUNTER — Other Ambulatory Visit: Payer: Self-pay

## 2019-05-14 ENCOUNTER — Encounter: Payer: Self-pay | Admitting: Neurology

## 2019-05-14 ENCOUNTER — Ambulatory Visit (INDEPENDENT_AMBULATORY_CARE_PROVIDER_SITE_OTHER): Payer: BC Managed Care – PPO | Admitting: Neurology

## 2019-05-14 VITALS — BP 124/85 | HR 79 | Temp 97.3°F | Ht 68.5 in | Wt 184.0 lb

## 2019-05-14 DIAGNOSIS — G8220 Paraplegia, unspecified: Secondary | ICD-10-CM | POA: Diagnosis not present

## 2019-05-14 DIAGNOSIS — G35 Multiple sclerosis: Secondary | ICD-10-CM

## 2019-05-14 NOTE — Progress Notes (Signed)
**  Botox 100 units x 6 vials, NDC 2257-5051-83, Lot [id number], Exp 01/2022, specialty pharmacy.//mck,rn**

## 2019-05-14 NOTE — Progress Notes (Signed)
PATIENT: Sharon Odom DOB: Nov 20, 1970  Chief Complaint  Patient presents with  . Follow-up    Botox 100 units x 6 vials - specialty pharmacy     HISTORICAL   Sharon Odom is a 48 years old female, accompanied by her husband, referred by Dr. Epimenio Foot for evaluation of botulism toxin injection for spasticity due to multiple sclerosis, this is the first time I evaluated her, also performed injection today on April 18, 2018.  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  She received Lemtrada infusion in April 2017, second dose April 2018, due to continued mild progression of her symptoms, she complete her third Lemtrada infusion in October 2019.  She has been complaining of bilateral lower extremity, left upper extremity spasticity since late 2017, gradually getting worse, has tried different medications, including baclofen up to 120 mg daily, with limited benefit, she is not taking baclofen 80 mg, previously also tried and failed tizanidine, Trileptal, lower dose of botulism toxin a injection in the past only mild temporarily improvement.  She did not ambulate with walker at home, wheelchair for longer distance, complains painful frequent left lower extremity spasm, forceful left lower extremity extension, when she bear weight, she has left toes forceful painful flexion grabbing on the floor, also left shoulder, arm spasm, difficulty release left hand grip.  She was recently started on Valium 5 mg every 6 hours, which has  been helpful.  I personally reviewed the MRI of the brain and cervical spine in September 2019, the MRI of the brain shows multiple T2/FLAIR hyperintense foci in brainstem, cerebellum, left thalamus, hemisphere, consistent with typical chronic demyelinating plaque, no enhancement. MRI of cervical spine showed small T2/flair hyperintensity foci posterior to the left C3-4, several 45 also noted within the brainstem, multilevel mild degenerative changes.  No significant canal stenosis.   She also complains of urinary urgency, frequency, is taking Myrbetriq 50 mg, Wellbutrin 300 mg for depression, mild swallowing difficulty, swallowing evaluation is pending  UPDATE August 06 2018: She had first injection on April 18, 2018, we used Botox a 600 units, 400 units into spastic left lower extremity, 200 into left upper extremity,  Within 3 days, she notice weakness of left 1, 2 fingers, lasted for few weeks, she has difficulty using her left hand, but her left bicep pains has much improved,it also helped her left calf muscle spams.  She noticed increased left leg and shoulder muscle spasm since Jan 10.  UPDATE November 05 2018: She responded well to previous injection, reported 80% improvement of her left thigh muscle spasm, 50% improvement of her left shoulder, proximal arm muscle spasm, but the benefit of short lasting, she continue have bilateral toe flexion, painful, left worse than right, there was no improvement in her left calf muscle spasm, and pain.  UPDATE Sept 14 2020: She responded well to previous injection to left shoulder, but recently she complains of worsening left shoulder pain, is planning on to have evaluation and x-ray by her primary care physician, she denies significant improvement to the spasticity  of her left lower extremity, was recently evaluated by Dr. Ollen Bowl, planning on to have baclofen placement, per patient, low-dose baclofen intrathecal trial produced miracles,  UPDATE May 14 2019: She reported significant improvement with botulism toxin injection to her left shoulder, mild improvement to spasticity left lower extremity  REVIEW OF SYSTEMS: Full 14 system review of systems performed and notable only for as above All other review of systems were negative.  ALLERGIES: Allergies  Allergen Reactions  . Morphine Other (See Comments)    HOME MEDICATIONS: Current Outpatient Medications  Medication Sig Dispense Refill  . Acetaminophen (TYLENOL EXTRA STRENGTH PO) Take 500-1,000 mg by mouth every 6 (six) hours as needed.    Marland Kitchen albuterol (VENTOLIN HFA) 108 (90 Base) MCG/ACT inhaler Inhale 1-2 puffs into the lungs every 6 (six) hours as needed for wheezing or shortness of breath. 6.7 g 1  . Alemtuzumab (LEMTRADA) 12 MG/1.2ML SOLN Inject 12 mg into the vein See admin instructions. Once a year    . baclofen (LIORESAL) 20 MG tablet Take 1 tablet (20 mg total) by mouth 3 (three) times daily. (Patient taking differently: Take 20 mg by mouth 3 (three) times daily. 1-4 times per day) 90 each 0  . bisacodyl (DULCOLAX) 10 MG suppository Place 1 suppository (10 mg total) rectally daily as needed for moderate constipation. 12 suppository 0  . buPROPion (WELLBUTRIN XL) 300 MG 24 hr tablet Take 1 tablet (300 mg total) by mouth daily. 30 tablet 1  . CASCARA SAGRADA PO Take by mouth.    . Cholecalciferol (VITAMIN D-3) 125 MCG (5000 UT) TABS Take 5,000 Units by mouth daily. 30 tablet 1  . Cyanocobalamin (VITAMIN B-12) 2500 MCG SUBL Place 1 tablet (2,500 mcg total) under the tongue daily. 30 tablet 0  . dalfampridine 10 MG TB12 One po bid (Patient taking differently: Take 10 mg by mouth 2 (two) times daily. One po bid) 60 tablet 11  . diazepam (VALIUM) 5 MG tablet Take 1 tablet (5 mg total) by mouth every 8 (eight) hours as needed for muscle spasms. 30 tablet 0  . DULoxetine (CYMBALTA) 30 MG capsule Take 1 capsule (30 mg total) by mouth 2 (two) times daily. 60 capsule 3  . fluticasone  furoate-vilanterol (BREO ELLIPTA) 200-25 MCG/INH AEPB Inhale 1 puff into the lungs every morning. 1 each 0  . ibuprofen (ADVIL) 200 MG tablet Take 200 mg by mouth every 6 (six) hours as needed.    . levETIRAcetam (KEPPRA) 1000 MG tablet Take 1 tablet (1,000 mg total) by mouth 2 (two) times daily. 60 tablet 1  . montelukast (SINGULAIR) 10 MG tablet Take 1 tablet (10 mg total) by mouth daily. 30 tablet 1  . naproxen sodium (ALEVE) 220 MG tablet Take 220 mg by mouth daily as needed.    . ondansetron (ZOFRAN) 8 MG tablet Take 8 mg by mouth every 8 (eight) hours.    Marland Kitchen oxybutynin (DITROPAN-XL) 10 MG 24 hr tablet Take 1 tablet (10 mg total) by mouth daily. 30 tablet 1  . oxyCODONE-acetaminophen (PERCOCET) 10-325 MG tablet Take 1 tablet by mouth every 6 (six) hours as needed.    . polyethylene glycol (MIRALAX / GLYCOLAX) 17 g packet Take 17 g by mouth daily. 14 each 0  . ranitidine (ZANTAC) 300 MG tablet Take 1 tablet (300 mg total) by mouth daily as needed for heartburn. 30 tablet 0  . senna-docusate (SENOKOT-S) 8.6-50 MG tablet Take 1 tablet by mouth daily. 30 tablet 0   No  current facility-administered medications for this visit.    PAST MEDICAL HISTORY: Past Medical History:  Diagnosis Date  . Allergies   . Anxiety   . Arthritis   . Asthma   . Constipation   . Depression   . GERD (gastroesophageal reflux disease)   . Headache   . History of hiatal hernia   . Multiple sclerosis (HCC)   . Vision abnormalities   . Wears glasses     PAST SURGICAL HISTORY: Past Surgical History:  Procedure Laterality Date  . EXCISION MORTON'S NEUROMA Right   . fallopian tube removal    . INTRATHECAL PUMP IMPLANT Right 02/18/2019   Procedure: INTRATHECAL PUMP IMPLANT;  Surgeon: Odette FractionHarkins, Paul, MD;  [facility];  Service: Neurosurgery;  Laterality: Right;  INTRATHECAL PUMP IMPLANT  . WISDOM TOOTH EXTRACTION      FAMILY HISTORY: Family History  Problem Relation Age of Onset  . Diabetes Mother   .  Healthy Brother     SOCIAL HISTORY: Social History   Socioeconomic History  . Marital status: Married    Spouse name: Not on file  . Number of children: Not on file  . Years of education: Not on file  . Highest education level: Not on file  Occupational History  . Not on file  Tobacco Use  . Smoking status: Current Every Day Smoker    Packs/day: 0.50    Years: 32.00    Pack years: 16.00    Types: Cigarettes    Start date: 471988  . Smokeless tobacco: Never Used  Substance and Sexual Activity  . Alcohol use: Yes    Comment: 2-3 times per wk  . Drug use: Never  . Sexual activity: Not Currently  Other Topics Concern  . Not on file  Social History Narrative   Right handed    Caffeine: 1 cup or less per day- coffee   [employer]   Social Determinants of Health   Financial Resource Strain:   . Difficulty of Paying Living Expenses: Not on file  Food Insecurity: No Food Insecurity  . Worried About Programme researcher, broadcasting/film/videounning Out of Food in the Last Year: Never true  . Ran Out of Food in the Last Year: Never true  Transportation Needs: No Transportation Needs  . Lack of Transportation (Medical): No  . Lack of Transportation (Non-Medical): No  Physical Activity: Inactive  . Days of Exercise per Week: 0 days  . Minutes of Exercise per Session: 0 min  Stress: No Stress Concern Present  . Feeling of Stress : Not at all  Social Connections:   . Frequency of Communication with Friends and Family: Not on file  . Frequency of Social Gatherings with Friends and Family: Not on file  . Attends Religious Services: Not on file  . Active Member of Clubs or Organizations: Not on file  . Attends BankerClub or Organization Meetings: Not on file  . Marital Status: Not on file  Intimate Partner Violence: Not At Risk  . Fear of Current or Ex-Partner: No  . Emotionally Abused: No  . Physically Abused: No  . Sexually Abused: No     PHYSICAL EXAM   Vitals:   05/14/19 0817  BP: 124/85  Pulse: 79  Temp: (!)  97.3 F (36.3 C)  Weight: 184 lb (83.5 kg)  Height: 5' 8.5" (1.74 m)    Not recorded      Body mass index is 27.57 kg/m.   Fairly normal right arm and right lower extremity movement, mild  left arm weakness, fixation with rapid rotating movement, proximal and distal strength 4+, limited range of motion of left shoulder, difficulty extending left fingers after grip,   moderate spasticity of left lower extremity, left hip flexion 3, left knee flexion 4, left knee extension 4+, left ankle dorsiflexion 4+, plantarflexion 5,  She needs assistance to get up from seated position, rely on her cane, hyperextended her left knee,  dragging her left leg across the floor, tends to hold left arm elbow flexion.  DIAGNOSTIC DATA (LABS, IMAGING, TESTING) - I reviewed patient records, labs, notes, testing and imaging myself where available.   ASSESSMENT AND PLAN  Jerrilynn Mikowski is a 48 y.o. female    Relapsing Remitting Multiple Sclerosis, left spasticity, lower extremity more than left upper extremity.  We used BOTOX A 600 units today  Under EMG 200 units to left upper extremity, 400 units were injected into left lower extremity.  Left pectoralis major 100 units Left latissimus dorsi 100 units  Left tibialis posterior 100 units Left flexor digitorum longus 50 units  Left vastus lateralis 25 units Left rectus femoris 25 units   Left adductor longus 50 units Left semimembranous tenderness 50 units  Patient tolerated injection well will return to clinic in 3 months for repeat injection  Marcial Pacas, M.D. Ph.D.  [facility] [address] [place], [place] [address] Ph: [phone] Fax: [phone]  CC: Referring Provider

## 2019-05-18 DIAGNOSIS — R030 Elevated blood-pressure reading, without diagnosis of hypertension: Secondary | ICD-10-CM | POA: Diagnosis not present

## 2019-05-18 DIAGNOSIS — Z9689 Presence of other specified functional implants: Secondary | ICD-10-CM | POA: Diagnosis not present

## 2019-05-18 DIAGNOSIS — G35 Multiple sclerosis: Secondary | ICD-10-CM | POA: Diagnosis not present

## 2019-05-18 DIAGNOSIS — Z6827 Body mass index (BMI) 27.0-27.9, adult: Secondary | ICD-10-CM | POA: Diagnosis not present

## 2019-06-08 ENCOUNTER — Other Ambulatory Visit: Payer: Self-pay

## 2019-06-08 ENCOUNTER — Encounter: Payer: Self-pay | Admitting: Physical Medicine and Rehabilitation

## 2019-06-08 ENCOUNTER — Encounter
Payer: BC Managed Care – PPO | Attending: Physical Medicine and Rehabilitation | Admitting: Physical Medicine and Rehabilitation

## 2019-06-08 VITALS — BP 126/81 | HR 84 | Temp 97.7°F | Ht 68.0 in | Wt 178.0 lb

## 2019-06-08 DIAGNOSIS — K5903 Drug induced constipation: Secondary | ICD-10-CM | POA: Diagnosis present

## 2019-06-08 DIAGNOSIS — E569 Vitamin deficiency, unspecified: Secondary | ICD-10-CM | POA: Diagnosis not present

## 2019-06-08 DIAGNOSIS — G35 Multiple sclerosis: Secondary | ICD-10-CM | POA: Diagnosis not present

## 2019-06-08 DIAGNOSIS — J45909 Unspecified asthma, uncomplicated: Secondary | ICD-10-CM | POA: Diagnosis present

## 2019-06-08 DIAGNOSIS — E538 Deficiency of other specified B group vitamins: Secondary | ICD-10-CM | POA: Insufficient documentation

## 2019-06-08 DIAGNOSIS — G63 Polyneuropathy in diseases classified elsewhere: Secondary | ICD-10-CM | POA: Insufficient documentation

## 2019-06-08 DIAGNOSIS — R7989 Other specified abnormal findings of blood chemistry: Secondary | ICD-10-CM | POA: Diagnosis present

## 2019-06-08 MED ORDER — ONDANSETRON HCL 4 MG PO TABS: 4.0000 mg | ORAL_TABLET | ORAL | 1 refills | Status: DC | PRN

## 2019-06-08 MED ORDER — ONDANSETRON HCL 4 MG PO TABS: 8.0000 mg | ORAL_TABLET | ORAL | 1 refills | Status: DC | PRN

## 2019-06-08 MED ORDER — NITROFURANTOIN MONOHYD MACRO 100 MG PO CAPS: 100.0000 mg | ORAL_CAPSULE | Freq: Two times a day (BID) | ORAL | 0 refills | Status: DC

## 2019-06-08 MED ORDER — FLUTICASONE FUROATE-VILANTEROL 200-25 MCG/INH IN AEPB: 1.0000 | INHALATION_SPRAY | Freq: Every morning | RESPIRATORY_TRACT | 5 refills | Status: DC

## 2019-06-08 NOTE — Progress Notes (Signed)
Subjective:    Patient ID: Sharon Odom, female    DOB: [birth year], 49 y.o.   MRN: [id number]  HPI  Patient is a 49 yr old female with secondary progressive MS, Spasticity s/p ITB pump 9/20, being refilled and titrated by  Dr Ollen Bowl, s/p Botox last month for spasticity, with MS depression and Vit D/Vit B12 deficiency that could be related to MS/MS meds,Here for hospital f/u.   Patient had Botox- L hamstring, thigh, calf, and L bicep, and L pec. Had it 12/17- has kicked in- major stiffness improved.  Does  feel like the grabbing feeling is improved.  L pec is looser and hamstring is better.   Is voiding- but has UTI and wants/needs ABX.  Is trying to reduce amount of Oxycodone- now taking 1-2x/day usually now- was on 6x/day prior.  Also has Tylenol with codeine at home as well  - 2 tabs is too much.     Will go back on MS infusion- Lemtrada- 4th time getting it.  Goes M/T/W- to infusion center for 8 hours.  - 1x/year.   Checks labs every month after Lemtrada-  Including CBC and TSH.  Used to get Vit D level checked on a yearly basis. Takes Vit D 5k daily. And Vit B12- 2500 mcg daily.   Also needs Breo refills- gave 6 months and then can have PCP take over.  ITB pump- 682 mcg/day- doesn't know which concentration. Due to be refilled sometime in February- just had it done last Wednesday.  Goes for a boost still every 2 weeks.  Takes 7-10 days to notice the boost.  Started nausea and vomiting last night again.   Also having intermittent spasms when sits in place for too long- LLE locks up. Has spasms triggered by ROM/movement.     Has a new dx of deviated septum- ENT  Keppra - completely off it- took off because felt weird on it. Stopped 2 weeks ago and feeling better.    Sometimes even with senokot 2 tabs BID and cascara, still needs dulcolax suppository, sometimes.    Pain Inventory Average Pain 6 Pain Right Now 3 My pain is tingling and aching  In the last  24 hours, has pain interfered with the following? General activity 2 Relation with others 1 Enjoyment of life 2 What TIME of day is your pain at its worst? morning Sleep (in general) Fair  Pain is worse with: walking, sitting and some activites Pain improves with: rest, pacing activities and medication Relief from Meds: 9  Mobility walk with assistance use a walker ability to climb steps?  yes do you drive?  no use a wheelchair transfers alone Do you have any goals in this area?  yes  Function disabled: date disabled . I need assistance with the following:  meal prep, household duties and shopping  Neuro/Psych weakness numbness tremor tingling trouble walking spasms depression anxiety  Prior Studies Any changes since last visit?  no  Physicians involved in your care Any changes since last visit?  no   Family History  Problem Relation Age of Onset  . Diabetes Mother   . Healthy Brother    Social History   Socioeconomic History  . Marital status: Married    Spouse name: Not on file  . Number of children: Not on file  . Years of education: Not on file  . Highest education level: Not on file  Occupational History  . Not on file  Tobacco Use  . Smoking  status: Current Every Day Smoker    Packs/day: 0.50    Years: 32.00    Pack years: 16.00    Types: Cigarettes    Start date: 35  . Smokeless tobacco: Never Used  Substance and Sexual Activity  . Alcohol use: Yes    Comment: 2-3 times per wk  . Drug use: Never  . Sexual activity: Not Currently  Other Topics Concern  . Not on file  Social History Narrative   Right handed    Caffeine: 1 cup or less per day- coffee   [employer]   Social Determinants of Health   Financial Resource Strain:   . Difficulty of Paying Living Expenses: Not on file  Food Insecurity: No Food Insecurity  . Worried About Programme researcher, broadcasting/film/video in the Last Year: Never true  . Ran Out of Food in the Last Year: Never true    Transportation Needs: No Transportation Needs  . Lack of Transportation (Medical): No  . Lack of Transportation (Non-Medical): No  Physical Activity: Inactive  . Days of Exercise per Week: 0 days  . Minutes of Exercise per Session: 0 min  Stress: No Stress Concern Present  . Feeling of Stress : Not at all  Social Connections:   . Frequency of Communication with Friends and Family: Not on file  . Frequency of Social Gatherings with Friends and Family: Not on file  . Attends Religious Services: Not on file  . Active Member of Clubs or Organizations: Not on file  . Attends Banker Meetings: Not on file  . Marital Status: Not on file   Past Surgical History:  Procedure Laterality Date  . EXCISION MORTON'S NEUROMA Right   . fallopian tube removal    . INTRATHECAL PUMP IMPLANT Right 02/18/2019   Procedure: INTRATHECAL PUMP IMPLANT;  Surgeon: Odette Fraction, MD;  Location: [facility] OR;  Service: Neurosurgery;  Laterality: Right;  INTRATHECAL PUMP IMPLANT  . WISDOM TOOTH EXTRACTION     Past Medical History:  Diagnosis Date  . Allergies   . Anxiety   . Arthritis   . Asthma   . Constipation   . Depression   . GERD (gastroesophageal reflux disease)   . Headache   . History of hiatal hernia   . Multiple sclerosis (HCC)   . Vision abnormalities   . Wears glasses    BP 126/81   Pulse 84   Temp 97.7 F (36.5 C)   Ht 5\' 8"  (1.727 m)   Wt 178 lb (80.7 kg) Comment: wheelchair tare wt 38lb  SpO2 95%   BMI 27.06 kg/m   Opioid Risk Score:   Fall Risk Score:  `1  Depression screen PHQ 2/9  Depression screen PHQ 2/9 04/27/2019  Decreased Interest 1  Down, Depressed, Hopeless 1  PHQ - 2 Score 2  Altered sleeping 1  Tired, decreased energy 1  Change in appetite 1  Feeling bad or failure about yourself  0  Trouble concentrating 1  Moving slowly or fidgety/restless 2  Suicidal thoughts 0  PHQ-9 Score 8  Difficult doing work/chores Very difficult    Review of Systems   Constitutional: Negative.   HENT: Negative.   Eyes: Negative.   Respiratory: Negative.   Gastrointestinal: Positive for diarrhea, nausea and vomiting.  Endocrine: Negative.   Genitourinary: Negative.   Musculoskeletal: Positive for gait problem.       Spasms  Skin: Negative.   Neurological: Positive for tremors, weakness and numbness.  Tingling  Psychiatric/Behavioral: Positive for dysphoric mood. The patient is nervous/anxious.   All other systems reviewed and are negative.      Objective:   Physical Exam  Awake, alert, appropriate, accompanied by husband- came in manual w/c, had to stand multiple times in appointment due to spasms, NAD Wear carbon fiber L AFO Neuro: MAS of L elbow MAS of 1+ to 2, L wrist MAS of 1+ and fingers MAS of L ankle- contracted at ~90 degrees MAS of RUE- Of 1 MAS of RLE- MAS of R knee and hip ~1+      Assessment & Plan:  Patient is a 49 yr old female with secondary progressive MS, Spasticity s/p ITB pump 9/20, being refilled and titrated by  Dr Maryjean Ka, s/p Botox last month for spasticity, with MS depression and Vit D/Vit B12 deficiency that could be related to MS/MS meds,    1. Will give Rx for Macrobid- 100 mg BID  2. Zofran 4 mg q4 hours prn #180- 1 RF- 20% of people get constipation with Zofran   3. Checked Vit D and Vit B12 levels since always low.  4. Suggest stretching on side- knees and hips B/L- before bed and in AM.   5.  Can take extra tylenol with Tylenol with codeine instead of 2 tabs.   6. Refilled Breo- for 6 months 200/25 mg  7. I suggest 1 extra senokot the morning she gets the boost of the intrathecal Baclofen pump- just that day!  8. F/U in 3 months  I spent a total of 35 minutes on appointment- more than 20 minutes on education on UTI, ABX usage, bowel program and pain control

## 2019-06-08 NOTE — Patient Instructions (Signed)
Patient is a 49 yr old female with secondary progressive MS, Spasticity s/p ITB pump 9/20, being refilled and titrated by  Dr Ollen Bowl, s/p Botox last month for spasticity, with MS depression and Vit D/Vit B12 deficiency that could be related to MS/MS meds,    1. Will give Rx for Macrobid- 100 mg BID  2. Zofran 4 mg q4 hours prn #180- 1 RF- 20% of people get constipation with Zofran   3. Checked Vit D and Vit B12 levels since always low.  4. Suggest stretching on side- knees and hips B/L- before bed and in AM.   5.  Can take extra tylenol with Tylenol with codeine instead of 2 tabs.   6. Refilled Breo- for 6 months 200/25 mg  7. I suggest 1 extra senokot the morning she gets the boost of the intrathecal Baclofen pump- just that day!  8. F/U in 3 months

## 2019-06-09 LAB — VITAMIN B12: Vitamin B-12: >2000 pg/mL — ABNORMAL HIGH (ref 232–1245)

## 2019-06-09 LAB — VITAMIN D 25 HYDROXY (VIT D DEFICIENCY, FRACTURES): Vit D, 25-Hydroxy: 61.8 ng/mL (ref 30.0–100.0)

## 2019-06-09 MED ORDER — ONDANSETRON HCL 4 MG PO TABS: 4.0000 mg | ORAL_TABLET | ORAL | 1 refills | Status: DC | PRN

## 2019-06-09 NOTE — Telephone Encounter (Signed)
I sent the Rx to [facility], since will be a lot less expensive.  Done.

## 2019-06-17 ENCOUNTER — Other Ambulatory Visit: Payer: Self-pay | Admitting: *Deleted

## 2019-06-17 MED ORDER — VALACYCLOVIR HCL 500 MG PO TABS: 500.0000 mg | ORAL_TABLET | Freq: Two times a day (BID) | ORAL | 1 refills | Status: DC

## 2019-06-18 ENCOUNTER — Other Ambulatory Visit: Payer: Self-pay | Admitting: *Deleted

## 2019-06-18 MED ORDER — VALACYCLOVIR HCL 500 MG PO TABS: 500.0000 mg | ORAL_TABLET | Freq: Two times a day (BID) | ORAL | 1 refills | Status: DC

## 2019-06-24 ENCOUNTER — Encounter: Payer: Self-pay | Admitting: *Deleted

## 2019-06-25 ENCOUNTER — Other Ambulatory Visit: Payer: Self-pay | Admitting: *Deleted

## 2019-06-25 MED ORDER — METHYLPREDNISOLONE 4 MG PO TBPK: ORAL_TABLET | ORAL | 0 refills | Status: DC

## 2019-07-21 ENCOUNTER — Encounter: Payer: Self-pay | Admitting: *Deleted

## 2019-07-30 ENCOUNTER — Encounter: Payer: Self-pay | Admitting: Neurology

## 2019-08-10 ENCOUNTER — Encounter: Payer: Self-pay | Admitting: Neurology

## 2019-08-17 ENCOUNTER — Encounter: Payer: Self-pay | Admitting: Neurology

## 2019-08-17 ENCOUNTER — Ambulatory Visit: Payer: Self-pay | Admitting: Neurology

## 2019-08-17 ENCOUNTER — Ambulatory Visit (INDEPENDENT_AMBULATORY_CARE_PROVIDER_SITE_OTHER): Payer: BC Managed Care – PPO | Admitting: Neurology

## 2019-08-17 ENCOUNTER — Other Ambulatory Visit: Payer: Self-pay

## 2019-08-17 VITALS — BP 121/76 | HR 73

## 2019-08-17 DIAGNOSIS — G8220 Paraplegia, unspecified: Secondary | ICD-10-CM

## 2019-08-17 DIAGNOSIS — G35 Multiple sclerosis: Secondary | ICD-10-CM

## 2019-08-17 NOTE — Progress Notes (Signed)
PATIENT: Sharon Odom DOB: 1970/08/14  Chief Complaint  Patient presents with  . MS, Spastic Diplegia    Botox 100 units x 6 vials - specialty pharmacy     HISTORICAL   Sharon Odom is a 49 years old female, accompanied by her husband, referred by Dr. Epimenio Foot for evaluation of botulism toxin injection for spasticity due to multiple sclerosis, this is the first time I evaluated her, also performed injection today on April 18, 2018.  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  She received Lemtrada infusion in April 2017, second dose April 2018, due to continued mild progression of her symptoms, she complete her third Lemtrada infusion in October 2019.  She has been complaining of bilateral lower extremity, left upper extremity spasticity since late 2017, gradually getting worse, has tried different medications, including baclofen up to 120 mg daily, with limited benefit, she is not taking baclofen 80 mg, previously also tried and failed tizanidine, Trileptal, lower dose of botulism toxin a injection in the past only mild temporarily improvement.  She did not ambulate with walker at home, wheelchair for longer distance, complains painful frequent left lower extremity spasm, forceful left lower extremity extension, when she bear weight, she has left toes forceful painful flexion grabbing on the floor, also left shoulder, arm spasm, difficulty release left hand grip.  She was recently started on Valium 5 mg every 6 hours,  which has been helpful.  I personally reviewed the MRI of the brain and cervical spine in September 2019, the MRI of the brain shows multiple T2/FLAIR hyperintense foci in brainstem, cerebellum, left thalamus, hemisphere, consistent with typical chronic demyelinating plaque, no enhancement. MRI of cervical spine showed small T2/flair hyperintensity foci posterior to the left C3-4, several 45 also noted within the brainstem, multilevel mild degenerative changes.  No significant canal stenosis.   She also complains of urinary urgency, frequency, is taking Myrbetriq 50 mg, Wellbutrin 300 mg for depression, mild swallowing difficulty, swallowing evaluation is pending  UPDATE August 06 2018: She had first injection on April 18, 2018, we used Botox a 600 units, 400 units into spastic left lower extremity, 200 into left upper extremity,  Within 3 days, she notice weakness of left 1, 2 fingers, lasted for few weeks, she has difficulty using her left hand, but her left bicep pains has much improved,it also helped her left calf muscle spams.  She noticed increased left leg and shoulder muscle spasm since Jan 10.  UPDATE November 05 2018: She responded well to previous injection, reported 80% improvement of her left thigh muscle spasm, 50% improvement of her left shoulder, proximal arm muscle spasm, but the benefit of short lasting, she continue have bilateral toe flexion, painful, left worse than right, there was no improvement in her left calf muscle spasm, and pain.  UPDATE Sept 14 2020: She responded well to previous injection to left shoulder, but recently she complains of worsening left shoulder pain, is planning on to have evaluation and x-ray by her primary care physician, she denies significant improvement to  the spasticity of her left lower extremity, was recently evaluated by Dr. Maryjean Ka, planning on to have baclofen placement, per patient, low-dose baclofen intrathecal trial produced  miracles,  UPDATE May 14 2019: She reported significant improvement with botulism toxin injection to her left shoulder, mild improvement to spasticity left lower extremity  UPDATE August 17 2019: She responded well to previous injection, left shoulder, left calf muscle spasm has much improved, she received baclofen pump by Dr. Maryjean Ka in September 2020, is going through dose adjustment,  REVIEW OF SYSTEMS: Full 14 system review of systems performed and notable only for as above All other review of systems were negative.  ALLERGIES: Allergies  Allergen Reactions  . Morphine Other (See Comments)    HOME MEDICATIONS: Current Outpatient Medications  Medication Sig Dispense Refill  . Acetaminophen (TYLENOL EXTRA STRENGTH PO) Take 500-1,000 mg by mouth every 6 (six) hours as needed.    Marland Kitchen acetaminophen-codeine (TYLENOL #3) 300-30 MG tablet Take by mouth every 4 (four) hours as needed for moderate pain.    Marland Kitchen albuterol (VENTOLIN HFA) 108 (90 Base) MCG/ACT inhaler Inhale 1-2 puffs into the lungs every 6 (six) hours as needed for wheezing or shortness of breath. 6.7 g 1  . Alemtuzumab (LEMTRADA) 12 MG/1.2ML SOLN Inject 12 mg into the vein See admin instructions. Once a year    . baclofen (LIORESAL) 10 MG tablet Take 10 mg by mouth 2 (two) times daily.    . bisacodyl (DULCOLAX) 10 MG suppository Place 1 suppository (10 mg total) rectally daily as needed for moderate constipation. 12 suppository 0  . buPROPion (WELLBUTRIN XL) 300 MG 24 hr tablet Take 1 tablet (300 mg total) by mouth daily. 30 tablet 1  . Cholecalciferol (VITAMIN D-3) 125 MCG (5000 UT) TABS Take 5,000 Units by mouth daily. 30 tablet 1  . Cyanocobalamin (VITAMIN B-12) 2500 MCG SUBL Place 1 tablet (2,500 mcg total) under the tongue daily. 30 tablet 0  . dalfampridine 10 MG TB12 One po bid (Patient taking differently: Take 10 mg by mouth 2 (two) times daily. One po bid) 60 tablet 11  . DULoxetine (CYMBALTA) 30 MG capsule Take 1 capsule  (30 mg total) by mouth 2 (two) times daily. 60 capsule 3  . fluticasone furoate-vilanterol (BREO ELLIPTA) 200-25 MCG/INH AEPB Inhale 1 puff into the lungs every morning. 1 each 5  . ibuprofen (ADVIL) 200 MG tablet Take 400 mg by mouth every 6 (six) hours as needed.     . methylPREDNISolone (MEDROL DOSEPAK) 4 MG TBPK tablet Take 6 tablets on day 1 and decrease by 1 tablet each day until finished 21 tablet 0  . montelukast (SINGULAIR) 10 MG tablet Take 1 tablet (10 mg total) by mouth daily. 30 tablet 1  . naproxen sodium (ALEVE) 220 MG tablet Take 220 mg by mouth daily as needed.    . ondansetron (ZOFRAN) 4 MG tablet Take 1 tablet (4 mg total) by mouth every 4 (four) hours as needed for nausea or vomiting. 180 tablet 1  . oxybutynin (DITROPAN-XL) 10 MG 24 hr tablet Take 1 tablet (10 mg total) by mouth daily. 30 tablet 1  . oxyCODONE-acetaminophen (PERCOCET) 10-325 MG tablet Take 1 tablet by mouth every 6 (six) hours as needed.    . polyethylene glycol (MIRALAX / GLYCOLAX) 17 g packet Take 17 g by mouth daily. 14 each 0  . ranitidine (ZANTAC) 300 MG tablet Take 1 tablet (300 mg total) by mouth daily as needed for heartburn. 30 tablet 0  .  senna-docusate (SENOKOT-S) 8.6-50 MG tablet Take 1 tablet by mouth daily. (Patient taking differently: Take 2 tablets by mouth daily. ) 30 tablet 0  . valACYclovir (VALTREX) 500 MG tablet Take 1 tablet (500 mg total) by mouth 2 (two) times daily. 180 tablet 1   No current facility-administered medications for this visit.    PAST MEDICAL HISTORY: Past Medical History:  Diagnosis Date  . Allergies   . Anxiety   . Arthritis   . Asthma   . Constipation   . Depression   . GERD (gastroesophageal reflux disease)   . Headache   . History of hiatal hernia   . Multiple sclerosis (HCC)   . Vision abnormalities   . Wears glasses     PAST SURGICAL HISTORY: Past Surgical History:  Procedure Laterality Date  . EXCISION MORTON'S NEUROMA Right   . fallopian tube  removal    . INTRATHECAL PUMP IMPLANT Right 02/18/2019   Procedure: INTRATHECAL PUMP IMPLANT;  Surgeon: Odette Fraction, MD;  Location: [facility] OR;  Service: Neurosurgery;  Laterality: Right;  INTRATHECAL PUMP IMPLANT  . WISDOM TOOTH EXTRACTION      FAMILY HISTORY: Family History  Problem Relation Age of Onset  . Diabetes Mother   . Healthy Brother     SOCIAL HISTORY: Social History   Socioeconomic History  . Marital status: Married    Spouse name: Not on file  . Number of children: Not on file  . Years of education: Not on file  . Highest education level: Not on file  Occupational History  . Not on file  Tobacco Use  . Smoking status: Current Every Day Smoker    Packs/day: 0.50    Years: 32.00    Pack years: 16.00    Types: Cigarettes    Start date: 2  . Smokeless tobacco: Never Used  Substance and Sexual Activity  . Alcohol use: Yes    Comment: 2-3 times per wk  . Drug use: Never  . Sexual activity: Not Currently  Other Topics Concern  . Not on file  Social History Narrative   Right handed    Caffeine: 1 cup or less per day- coffee   [employer]   Social Determinants of Health   Financial Resource Strain:   . Difficulty of Paying Living Expenses:   Food Insecurity: No Food Insecurity  . Worried About Programme researcher, broadcasting/film/video in the Last Year: Never true  . Ran Out of Food in the Last Year: Never true  Transportation Needs: No Transportation Needs  . Lack of Transportation (Medical): No  . Lack of Transportation (Non-Medical): No  Physical Activity: Inactive  . Days of Exercise per Week: 0 days  . Minutes of Exercise per Session: 0 min  Stress: No Stress Concern Present  . Feeling of Stress : Not at all  Social Connections:   . Frequency of Communication with Friends and Family:   . Frequency of Social Gatherings with Friends and Family:   . Attends Religious Services:   . Active Member of Clubs or Organizations:   . Attends Banker Meetings:   Marland Kitchen  Marital Status:   Intimate Partner Violence: Not At Risk  . Fear of Current or Ex-Partner: No  . Emotionally Abused: No  . Physically Abused: No  . Sexually Abused: No     PHYSICAL EXAM   Vitals:   08/17/19 0921  BP: 121/76  Pulse: 73    Not recorded      There is  no height or weight on file to calculate BMI.   Fairly normal right arm and right lower extremity movement, mild left arm weakness, fixation with rapid rotating movement, proximal and distal strength 4+, limited range of motion of left shoulder, difficulty extending left fingers after grip,   Mild to moderate spasticity of left lower extremity, left hip flexion 3, left knee flexion 4, left knee extension 4+, left ankle dorsiflexion 4+, plantarflexion 5,  She needs assistance to get up from seated position, rely on her cane, hyperextended her left knee,  dragging her left leg across the floor, tends to hold left arm elbow flexion.  DIAGNOSTIC DATA (LABS, IMAGING, TESTING) - I reviewed patient records, labs, notes, testing and imaging myself where available.   ASSESSMENT AND PLAN  Lenox Ladouceur is a 49 y.o. female    Relapsing Remitting Multiple Sclerosis, left spasticity, lower extremity more than left upper extremity.  We used BOTOX A 600 units today  Under EMG 200 units to left upper extremity, 400 units were injected into left lower extremity.  Left pectoralis major 100 units Left latissimus dorsi 50 units  Left tibialis posterior 100 units Left flexor digitorum longus 100 units Left medial gastrocnemius 50 units  Left vastus lateralis 25 units Left rectus femoris 25 units   Left vastus medialis 25 units Left adductor longus 25 units  Left semimembranous tenderness 50 units Left biceps femoris long head 50 units  Patient tolerated injection well will return to clinic in 3 months for repeat injection  Levert Feinstein, M.D. Ph.D.  [facility] [address] [place], [place]  [address] Ph: [phone] Fax: [phone]  CC: Referring Provider

## 2019-08-17 NOTE — Progress Notes (Signed)
**  Botox 100 units x 6 vials, NDC 4888-9169-45, Lot [id number], Exp 03/2022, specialty pharmacy.//[email or web address]**

## 2019-08-17 NOTE — Telephone Encounter (Signed)
Message from patient

## 2019-08-19 ENCOUNTER — Ambulatory Visit: Payer: Medicare Other | Admitting: Neurology

## 2019-09-03 ENCOUNTER — Ambulatory Visit (INDEPENDENT_AMBULATORY_CARE_PROVIDER_SITE_OTHER): Payer: BC Managed Care – PPO | Admitting: Neurology

## 2019-09-03 ENCOUNTER — Other Ambulatory Visit: Payer: Self-pay

## 2019-09-03 ENCOUNTER — Encounter: Payer: Self-pay | Admitting: Neurology

## 2019-09-03 VITALS — BP 128/87 | HR 89 | Temp 98.4°F | Ht 69.0 in | Wt 169.0 lb

## 2019-09-03 DIAGNOSIS — G8220 Paraplegia, unspecified: Secondary | ICD-10-CM | POA: Diagnosis not present

## 2019-09-03 DIAGNOSIS — G35 Multiple sclerosis: Secondary | ICD-10-CM

## 2019-09-03 DIAGNOSIS — M62838 Other muscle spasm: Secondary | ICD-10-CM | POA: Diagnosis not present

## 2019-09-03 DIAGNOSIS — R208 Other disturbances of skin sensation: Secondary | ICD-10-CM

## 2019-09-03 DIAGNOSIS — R269 Unspecified abnormalities of gait and mobility: Secondary | ICD-10-CM | POA: Diagnosis not present

## 2019-09-03 DIAGNOSIS — H532 Diplopia: Secondary | ICD-10-CM

## 2019-09-03 MED ORDER — DALFAMPRIDINE ER 10 MG PO TB12: 10.0000 mg | ORAL_TABLET | Freq: Two times a day (BID) | ORAL | 3 refills | Status: DC

## 2019-09-03 NOTE — Progress Notes (Signed)
[facility]  PATIENT: Sharon Odom DOB: Jan 10, 1971  REFERRING DOCTOR OR PCP:  Jeannie Fend SOURCE: Patient, notes imaging and lab reports, MRI images on CD from neurology,  _________________________________   HISTORICAL   CHIEF COMPLAINT:  Chief Complaint  Patient presents with  . Follow-up     rm 12, pt states that she is in severe pain at this time.     HISTORY OF PRESENT ILLNESS: [name] is a 49 y.o. woman with active/relapsing secondary progressive multiple sclerosis, left greater than right spasticity and poor gait.  Update 09/03/2019: She had a third year of Lemtrada for her MS January 2021.    Blood work has show low lymphocytes but otherwise ok.   She is reporting more pain in both legs and feet.    The worse pain is in the bottom of her feet, left > right.    Prialt was added to her baclofen for the baclofen pump (Dr. Maryjean Ka).   She is currently on 1050 mcg/day.   It has helped the foot stiffness a lot and Dr. Krista Blue did Botox in the hamstring and left biceps.  In the past injections into the forearm caused too much weakness.     Pain is worse sitting in some chairs than in others.    She feels her gait has done worse since the baclofen pump though spasms are better.      She notes pain fluctuates a lot day by day.     She can walk about 20-25 feet with a walker.   She dies better with the standard walker than with the Rollator.    She notes more hesitancy.     She does not think the duloxetine is helping any and we discussed d/c,     Update 04/20/2019: She has a baclofen pump and the dose is being titrated up (440 mcg now, she believes; sees them again on Wednesday).   She denies any improvement in spasticity but feels he walking is worse.   A new brace adds weight and she has more trouble lifting her left leg.   She uses a walker to get out of a chair.      She feels spasms in her feet are better,   However, nerve pain seems worse.   She had been  on gabapentin in the past and did not tolerate (can;t remember side effect).   Lyrica caused weight gain.   Lamotrigine was not wrll tolerated.   Keppra  She felt her neck was weaker and had trouble keeping hr head up.  Nystagmus has been more noticeable.   She feels her vision is more blurry.   She notes no asymmetry in color vision.     Her last Lemtrada was 10/19.   She ws recently hospitalized due to weakness.   With Solu-Medrol, she felt stronger and swallowing improved.   Her percocet was changed to oxycodone.  It helps the nerve pain.    Update 03/19/2019: Her baclofen trial went well.   She had the baclofen pump placed 02/18/2019 and dose is being increased (now 215 mcg/day).  Her legs are stiff.   She reports Dr. Maryjean Ka feels she might be having an exacerbation and has ordered MRI's.   She is taking baclofen pills 4 times a day (was taking 6 times a day).   Diazepam has not helped the spasticity though helped the anxiety.     Currently she is having hand spasticity on the left and  left > right leg spasticity.     She had her last Lemtrada October 2019 and we will try to get labwork form home   Update 12/15/2018: She has had Lemtrade x 3 courses for her MS.  Last dose was October 2019.   Lymphocyte count 11/2018 was 0.9.    She has no exacerbations.   She is noting more spasticity in her right leg than before and spasticity on the left is about the same.      She has severe spasticity and has tried Botox several courses without much benefit for the legs but she had some benefit in the left arm.   We discussed the baclofen pump.    Currently she is on baclofen 120 mg and valium  a day.   Tizanidine had not helped her in the past.    She is noting more fatigue.    She has some anxiety, helped by valium.     She also has dysesthesias that were better with Lyrica but she had weight gain and constipation and stopped.     She started Cymbalta.    Lamotrigine was not well tolerated.       Update 09/18/2018: She had her third year of Lemtrada in October 2019.  She tolerated it well.  CD4 count was 66 December 2019.  Most recent lymphocyte count is 0.6.  We had a conversation about COVID and [place].  As her lymphocytes are recovering, her risk is probably just slightly worse than it would be otherwise.  However, if the pandemic is still ongoing in a significant way in the late fall, we may want to push back any additional dosing.  She feels her MS is doing about the same with no new exacerbations though she has continued impairments, left > right.    She has severe spasticity and poor gait.     She had Botox 08/06/2018 (Dr. Terrace Arabia) and she feel she got a good benefit at first with almost no spasms for a few days.   She is noting less stiffness in her thigh.  However, the spasticity in her lower leg and foot is not better. Calf still has severe spasticity.  She had 400 Units in the leg and 200 units in the arm.   She did not get a benefit in the arm.    She is having more pain in the left arm and leg, worse while sitting than standing.    Baclofen 120 mg daily is not helping.   She is also taking valium 10 mg -- this has helped anxiety more than spasticity. Her spasticity seemed to worsen a couple years ago.    She has dysesthesias, left > right.  Oxcarbaxepine and lamotrigine were poorly tolerated.  She has more fatigue and sleepiness.     She went to the ED 08/21/2018 due to shortness of breath and she was felt to be having severe seasonal allergies.   Update 04/14/2018: During REMS testing her creatinine level was elevated at 1.41 (it was 0.91 1 month earlier).   Of note, she reports that her creatinine has fluctuated over the years and that there was one other time when she was felt to have a high reading that was felt to be due to dehydration.  She has noted decreased oral liquid intake at times due to dysphasia.   We discussed possibility of an autoimmune kidney disease as that can be  seen at about 1: 400 Lemtrada patients  Spasticity is her  main issue.    The left leg, especially, spasms up with movements.   Botox had not had much of a benefit on the left and maybe a 25% benefit in the milder right.    Spasms are very painful.    She also has spasticity in the left arm.    Pain feels like the arm is being held tightly by someone.  The left leg does worse with activity so she can't exercise with it.     She has done PT in the past but it has not helped much.    She is having more trouble with basic ADL.   She got the [facility] but it did not help that much.      She tires out easily with any activity.      She notes more trouble with swallowing liquids and is drinking less.     The problem is initiating the swallowing.     Hr sister notes that she seems to be having some hearing loss as she turns up TV louder and misses what people say at times.    Her anxiety is worse and she is back on Xanax.     More spasticity in the left arm, especially after squeezing, she can't easily straighten the fingers back up  Update 01/23/2018: Her main problem continues to be spasticity in the legs, left greater than right.  Associated with the spasticity she has quite a bit of pain.  She felt she received benefit from the Botox injections into the legs for a few weeks but then the spasticity returned to the same level.  We discussed using a larger dose today and will consider increasing the dose further based on her response.  She is able to tolerate the baclofen better and has been able to increase the dose.  Gait is poor.  Ampyra seems to be helping a little bit.  She had 2 years of Lemtrada therapy, with the last dose about 15 months ago.  We are in the process of trying to get an additional year of [place] approved.    Update 10/16/2017: After the last visit, the baclofen dose was increased but she had trouble tolerating higher doses and did not think that her spasticity improved any.  She  continues to report a lot of spasticity in both legs, left greater than right and in the left arm.  She denies spasticity in the right arm.  There continues to phasic spasms superimposed on the tonic spasms predominantly involving both feet and the left leg.  Gait is poor.  Around the home she will use a walker and can get from one room to another.   At the last visit I also started lamotrigine for her dysesthesias but it was poorly tolerated and she stopped.  Previously she had been unable to tolerate oxcarbazepine.  She has not yet restarted the Ampyra.  In the past, she had received Botox injections into muscles of the left lower leg and both feet.  This helped quite a bit with the first series but less so with the second series.  Since she has moved up to this area, she has not had any more Botox injections.  She had her first course of Lemtrada April 2017 and the second course of April 2018.  She has noted some further progression over the last year and we have discussed an additional third course.  She had shingles earlier this year.  Pain has resolved.  From 08/15/2017:  She was diagnosed in October 2013 due to difficulty with her handwriting and right hand coordination.   A few weeks later she had gait ataxia and numbness and was referred to MS.   She had an MRI of the brain (was in [place] at the time) and then had a LP confirming the diagnosis.  Initially, she was placed on Copaxone but had severe frequent exacerbations and then went to [facility] due to breakthrough exacerbations.  She was referred to [facility] for Tysabri (Feb 2014 to November 2016).  She then moved to [facility] and saw Dr. Ilene Qua [facility] [phone]).   She was always JCV Ab titer positive.    She did well on it but stopped 03/2015 due to safety risk.  She had more spasticity early 2017 that improved with 5 days IV Solumedrol and then started [facility] April 2017.   The infusion went well.    She started having more spasms in  her legs later in 2017 and had Botox injections into her feet (some benefit but only for 3-4 weeks).   She had a second course of Botox November 2017 without much benefit.     She had numbness and tingling in her left arm November 2017 and was given an ESI.   Medical MJ did not help.   She had her second course of Lemtrada in April 2018 but the steroid did not help her spasticity as it did the first year.    She saw Dr. Gaynell Face at [facility] a couple times in 2018 and also saw Dr. Aquilla Hacker at [facility].  Currently,   She uses a walker (rarely cane) around her home and is wheeled outside the house.   Earlier this year, she had worsening spasticity with toes very spastic and legs locking up.    The severe leg spasms have improved but she still has spasms in her feet that are severe and painful.   Spasticity is worse on the left (foot, leg and arm) and only in her foot on the right.    She takes 20 mg po qid.   Tizanidine did not help much.   She has some painful dysesthesia but gabapentin had not helped.   She continues to have a lot of pain, worse on her left.     Her left arm is also weak and she can't do many self care tasks (dressing, etc).    Due to the pain, she was drinking alcohol (2 - 3 drinks).   Clonazepam was tried but doses were low.   She was on Ampyra in the past 2014-2017 but felt it wasn't working and stopped.   She was on both Ampyra and Wellbutrin for at least one year and there were no seizures.     She has urinary frequency and urgency, helped by Myrbetriq.   Sometimes due to her gait, she cannot get to the bathroom in time.    She had diplopia during 1 of her exacerbations a few years ago.  She continues to have some double vision though she is doing better now.    She never had optic neuritis.    Wellbutrin helped her mood better initially and she is very frustrated and irritable.   She was diagnosed with PBA in May 2018 and started on Nuedexta.   She did better mood-wise  but spasticity was worse and she stopped.       She feels cognitive skills are mildly worse with mild short term memory issues and  decreased focus and attention.     I have reviewed the MRI of the brain and cervical spine dated 03/19/2017.  The MRI of the brain shows multiple T2/FLAIR hyperintense foci in the cerebellum, pons, left greater than right thalamus and in the periventricular, juxtacortical and deep white matter of both hemispheres.    The MRI of the cervical spine showed a subtle focus Adjacent to C4-C5.  She has a disc protrusion at [facility].  REVIEW OF SYSTEMS: Constitutional: No fevers, chills, sweats, or change in appetite.  She reports fatigue. Eyes: As above Ear, nose and throat: No hearing loss, ear pain, nasal congestion, sore throat Cardiovascular: No chest pain, palpitations Respiratory: No shortness of breath at rest or with exertion.   No wheezes GastrointestinaI: No nausea, vomiting, diarrhea, abdominal pain, fecal incontinence Genitourinary:As above  musculoskeletal:She has pain in her legs, left greater than right due to spasticity.  She notes mild back pain. Integumentary: No rash, pruritus, skin lesions Neurological: as above Psychiatric: As above Endocrine: No palpitations, diaphoresis, change in appetite, change in weigh or increased thirst Hematologic/Lymphatic: No anemia, purpura, petechiae. Allergic/Immunologic: No itchy/runny eyes, nasal congestion, recent allergic reactions, rashes  ALLERGIES: Allergies  Allergen Reactions  . Morphine Other (See Comments)    HOME MEDICATIONS:  Current Outpatient Medications:  .  Acetaminophen (TYLENOL EXTRA STRENGTH PO), Take 500-1,000 mg by mouth every 6 (six) hours as needed., Disp: , Rfl:  .  acetaminophen-codeine (TYLENOL #3) 300-30 MG tablet, Take by mouth every 4 (four) hours as needed for moderate pain., Disp: , Rfl:  .  albuterol (VENTOLIN HFA) 108 (90 Base) MCG/ACT inhaler, Inhale 1-2 puffs into the lungs  every 6 (six) hours as needed for wheezing or shortness of breath., Disp: 6.7 g, Rfl: 1 .  Alemtuzumab (LEMTRADA) 12 MG/1.2ML SOLN, Inject 12 mg into the vein See admin instructions. Once a year, Disp: , Rfl:  .  baclofen (LIORESAL) 10 MG tablet, Take 10 mg by mouth 2 (two) times daily., Disp: , Rfl:  .  bisacodyl (DULCOLAX) 10 MG suppository, Place 1 suppository (10 mg total) rectally daily as needed for moderate constipation., Disp: 12 suppository, Rfl: 0 .  buPROPion (WELLBUTRIN XL) 300 MG 24 hr tablet, Take 1 tablet (300 mg total) by mouth daily., Disp: 30 tablet, Rfl: 1 .  Cholecalciferol (VITAMIN D-3) 125 MCG (5000 UT) TABS, Take 5,000 Units by mouth daily., Disp: 30 tablet, Rfl: 1 .  Cyanocobalamin (VITAMIN B-12) 2500 MCG SUBL, Place 1 tablet (2,500 mcg total) under the tongue daily., Disp: 30 tablet, Rfl: 0 .  dalfampridine 10 MG TB12, Take 1 tablet (10 mg total) by mouth 2 (two) times daily. One po bid, Disp: 180 tablet, Rfl: 3 .  fluticasone furoate-vilanterol (BREO ELLIPTA) 200-25 MCG/INH AEPB, Inhale 1 puff into the lungs every morning., Disp: 1 each, Rfl: 5 .  ibuprofen (ADVIL) 200 MG tablet, Take 400 mg by mouth every 6 (six) hours as needed. , Disp: , Rfl:  .  methylPREDNISolone (MEDROL DOSEPAK) 4 MG TBPK tablet, Take 6 tablets on day 1 and decrease by 1 tablet each day until finished, Disp: 21 tablet, Rfl: 0 .  montelukast (SINGULAIR) 10 MG tablet, Take 1 tablet (10 mg total) by mouth daily., Disp: 30 tablet, Rfl: 1 .  naproxen sodium (ALEVE) 220 MG tablet, Take 220 mg by mouth daily as needed., Disp: , Rfl:  .  ondansetron (ZOFRAN) 4 MG tablet, Take 1 tablet (4 mg total) by mouth every 4 (four) hours as needed  for nausea or vomiting., Disp: 180 tablet, Rfl: 1 .  oxybutynin (DITROPAN-XL) 10 MG 24 hr tablet, Take 1 tablet (10 mg total) by mouth daily., Disp: 30 tablet, Rfl: 1 .  oxyCODONE-acetaminophen (PERCOCET) 10-325 MG tablet, Take 1 tablet by mouth every 6 (six) hours as needed.,  Disp: , Rfl:  .  polyethylene glycol (MIRALAX / GLYCOLAX) 17 g packet, Take 17 g by mouth daily., Disp: 14 each, Rfl: 0 .  ranitidine (ZANTAC) 300 MG tablet, Take 1 tablet (300 mg total) by mouth daily as needed for heartburn., Disp: 30 tablet, Rfl: 0 .  senna-docusate (SENOKOT-S) 8.6-50 MG tablet, Take 1 tablet by mouth daily. (Patient taking differently: Take 2 tablets by mouth daily. ), Disp: 30 tablet, Rfl: 0 .  valACYclovir (VALTREX) 500 MG tablet, Take 1 tablet (500 mg total) by mouth 2 (two) times daily., Disp: 180 tablet, Rfl: 1  PAST MEDICAL HISTORY: Past Medical History:  Diagnosis Date  . Allergies   . Anxiety   . Arthritis   . Asthma   . Constipation   . Depression   . GERD (gastroesophageal reflux disease)   . Headache   . History of hiatal hernia   . Multiple sclerosis (HCC)   . Vision abnormalities   . Wears glasses     PAST SURGICAL HISTORY:   FAMILY HISTORY: Family History  Problem Relation Age of Onset  . Diabetes Mother   . Healthy Brother     SOCIAL HISTORY:  Social History   Socioeconomic History  . Marital status: Married    Spouse name: Not on file  . Number of children: Not on file  . Years of education: Not on file  . Highest education level: Not on file  Occupational History  . Not on file  Tobacco Use  . Smoking status: Current Every Day Smoker    Packs/day: 0.50    Years: 32.00    Pack years: 16.00    Types: Cigarettes    Start date: 67  . Smokeless tobacco: Never Used  Substance and Sexual Activity  . Alcohol use: Yes    Comment: 2-3 times per wk  . Drug use: Never  . Sexual activity: Not Currently  Other Topics Concern  . Not on file  Social History Narrative   Right handed    Caffeine: 1 cup or less per day- coffee   [employer]   Social Determinants of Health   Financial Resource Strain:   . Difficulty of Paying Living Expenses:   Food Insecurity: No Food Insecurity  . Worried About Programme researcher, broadcasting/film/video in the Last  Year: Never true  . Ran Out of Food in the Last Year: Never true  Transportation Needs: No Transportation Needs  . Lack of Transportation (Medical): No  . Lack of Transportation (Non-Medical): No  Physical Activity: Inactive  . Days of Exercise per Week: 0 days  . Minutes of Exercise per Session: 0 min  Stress: No Stress Concern Present  . Feeling of Stress : Not at all  Social Connections:   . Frequency of Communication with Friends and Family:   . Frequency of Social Gatherings with Friends and Family:   . Attends Religious Services:   . Active Member of Clubs or Organizations:   . Attends Banker Meetings:   Marland Kitchen Marital Status:   Intimate Partner Violence: Not At Risk  . Fear of Current or Ex-Partner: No  . Emotionally Abused: No  . Physically Abused: No  .  Sexually Abused: No     PHYSICAL EXAM  Vitals:   09/03/19 1442  BP: 128/87  Pulse: 89  Temp: 98.4 F (36.9 C)  Weight: 169 lb (76.7 kg)  Height: 5\' 9"  (1.753 m)    Body mass index is 24.96 kg/m.   General: The patient is well-developed and well-nourished and in no acute distress  Neurologic Exam  Mental status: The patient is alert and oriented x 3 at the time of the examination. The patient has apparent normal recent and remote memory, with an apparently normal attention span and concentration ability.   Speech is normal.  Cranial nerves: Bilateral INO's, right greater than left.  Facial strength and sensation is normal.  The tongue is midline, and the patient has symmetric elevation of the soft palate. No obvious hearing deficits are noted.  Motor:   Spasticity is fairly resolved.  There were no flexion spasms.    She has mildly reduced in the left arm.  The right arm has normal muscle tone.  Strength is 4 / 5 in the left arm, 3/5 in the left leg and 4- to 4/5 in the right leg .     Sensory: Normal sensation to touch and vibration in the arms.  She has reduced sensation to vibration in the left  leg.  Coordination: She has good right finger-nose-finger but reduced left finger-nose-finger.  HTS is poor on the right and she can't do on the left.    Gait and station: She is in a wheelchair needs support to stand up..    Reflexes: Deep tendon reflexes are now 1+ at the knees.  No clonus at the ankles    ASSESSMENT AND PLAN     1. Multiple sclerosis (HCC)   2. Spastic diplegia, acquired, lower extremity (HCC)   3. Muscle spasticity   4. Gait disturbance   5. Diplopia   6. Dysesthesia      1.  She has had 4 courses of Lemtrada for her MS.    She will need REMS labs x 4 years. 2.  Spasticity is much better on higher dose of baclofen.  She will f/u with Dr. . 3.  Continue Ampyra 4.  If Prialt does not help, consider adding Vimpat     5.   Continue other medications Follow-up in 6 months or sooner if there are new or worsening neurologic symptoms.     Tiphany Fayson A. Ollen Bowl, MD, [facility] 09/03/2019, 3:33 PM Certified in Neurology, Clinical Neurophysiology, Sleep Medicine, Pain Medicine and Neuroimaging  [facility] [address] [place], [place] [phone]

## 2019-09-07 ENCOUNTER — Encounter: Payer: Self-pay | Admitting: Physical Medicine and Rehabilitation

## 2019-09-07 ENCOUNTER — Encounter
Payer: BC Managed Care – PPO | Attending: Physical Medicine and Rehabilitation | Admitting: Physical Medicine and Rehabilitation

## 2019-09-07 ENCOUNTER — Other Ambulatory Visit: Payer: Self-pay

## 2019-09-07 VITALS — BP 130/81 | HR 77 | Ht 69.0 in | Wt 170.0 lb

## 2019-09-07 DIAGNOSIS — M62838 Other muscle spasm: Secondary | ICD-10-CM

## 2019-09-07 DIAGNOSIS — R269 Unspecified abnormalities of gait and mobility: Secondary | ICD-10-CM | POA: Diagnosis not present

## 2019-09-07 DIAGNOSIS — J45909 Unspecified asthma, uncomplicated: Secondary | ICD-10-CM | POA: Insufficient documentation

## 2019-09-07 DIAGNOSIS — R7989 Other specified abnormal findings of blood chemistry: Secondary | ICD-10-CM | POA: Insufficient documentation

## 2019-09-07 DIAGNOSIS — E538 Deficiency of other specified B group vitamins: Secondary | ICD-10-CM | POA: Diagnosis present

## 2019-09-07 DIAGNOSIS — G8220 Paraplegia, unspecified: Secondary | ICD-10-CM | POA: Diagnosis not present

## 2019-09-07 DIAGNOSIS — G35 Multiple sclerosis: Secondary | ICD-10-CM | POA: Diagnosis not present

## 2019-09-07 DIAGNOSIS — K5903 Drug induced constipation: Secondary | ICD-10-CM | POA: Diagnosis present

## 2019-09-07 MED ORDER — FLUTICASONE-SALMETEROL 100-50 MCG/DOSE IN AEPB: 1.0000 | INHALATION_SPRAY | Freq: Two times a day (BID) | RESPIRATORY_TRACT | 4 refills | Status: DC

## 2019-09-07 NOTE — Patient Instructions (Signed)
Patient is a 49 yr old female with secondary progressive MS with L>R spasticity s/p ITB pump  1. Got w/c through adapt- called and LM for [facility]- has been almost 5 months since ordered.   2. Once Virgel Bouquet has run out, IF still having breathing/pollen issues, take Advair 100/50 1 puff BID- suck it in 2x/day and rinse mouth afterwards.  Don't take at same time as Virgel Bouquet- do the same thing.   3. Con't ITB pump.   4. Went over options for nerve pain- will think about Lyrica for nerve pain.  And Let me know if wants it. Can document that she's tried everything else.    5. Lost 45 lbs since cut out carbs.    6. F/U in 3 months- or earlier if needed.  Started remodeling Estate agent- ongoing. Was supposed ot take 7-1 days Wants to do additional PT in near future- happy to write for this.  Went with vertical platform lift.  Let Cherie know.

## 2019-09-07 NOTE — Progress Notes (Signed)
Subjective:    Patient ID: Sharon Odom, female    DOB: 11-15-1970, 49 y.o.   MRN: [id number]  HPI    Felt Dr Bonnita Hollow note was inaccurate.  Never mentioned nerve pain in L hand. Pins and needles.  And feels like tips of fingers are numb.  Can get really intense.  Denies it burning or shooting pain.   Uses compression sleeve since compression helps pain of L hand.  Has tried lidocaine cream- didn't help hand pain.    ITB up to 1050 mcg/day.  Using 500cg/ml? Boost her up every 2 weeks.   Worst pain she has when sitting in chair/W/C.  Stiffens up. Very painful.  Makes it hard to walk. And extremely painful.    Put on doctor last year Duloxetine for nerve pain.  Came off it. Since wasn't working for her.    Lyrica helped BUT put on a LOT of weight so took self off it.  Cannot do anti-seizures meds- cannot tolerate them- esp Trileptal.  Stopped that medicine.   Priault in ITB pump- for pain control.   Ball of foot B/L- ever since got ITB pump- hard time spreading toes- pre pump, but worse now.  When stands, doesn't feel it, the ball of foot discomfort Has to manually spread/stretch toes L>>>R to help discomfort.   Dr Carlis Abbott wrote for Virgel Bouquet- for pt- insurance won't cover it anymore.  Throat getting swollen with pollen.   Still waiting on w/c. Has been 4 months since was ordered.  Got call from insurance/w/c company 4 weeks ago.    Pain Inventory Average Pain 7 Pain Right Now 3 My pain is burning, dull, stabbing, tingling and aching  In the last 24 hours, has pain interfered with the following? General activity 0 Relation with others 0 Enjoyment of life 0 What TIME of day is your pain at its worst? na Sleep (in general) NA  Pain is worse with: walking, bending, sitting, inactivity and standing Pain improves with: rest and medication Relief from Meds: 8  Mobility walk with assistance use a walker ability to climb steps?  no do you drive?  no use a  wheelchair transfers alone  Function disabled: date disabled 10/13 I need assistance with the following:  meal prep, household duties and shopping  Neuro/Psych weakness numbness tremor tingling trouble walking spasms depression anxiety  Prior Studies Any changes since last visit?  no  Physicians involved in your care Any changes since last visit?  no   Family History  Problem Relation Age of Onset  . Diabetes Mother   . Healthy Brother    Social History   Socioeconomic History  . Marital status: Married    Spouse name: Not on file  . Number of children: Not on file  . Years of education: Not on file  . Highest education level: Not on file  Occupational History  . Not on file  Tobacco Use  . Smoking status: Current Every Day Smoker    Packs/day: 0.50    Years: 32.00    Pack years: 16.00    Types: Cigarettes    Start date: 68  . Smokeless tobacco: Never Used  Substance and Sexual Activity  . Alcohol use: Yes    Comment: 2-3 times per wk  . Drug use: Never  . Sexual activity: Not Currently  Other Topics Concern  . Not on file  Social History Narrative   Right handed    Caffeine: 1 cup or less per day-  coffee   [employer]   Social Determinants of Radio broadcast assistant Strain:   . Difficulty of Paying Living Expenses:   Food Insecurity: No Food Insecurity  . Worried About Charity fundraiser in the Last Year: Never true  . Ran Out of Food in the Last Year: Never true  Transportation Needs: No Transportation Needs  . Lack of Transportation (Medical): No  . Lack of Transportation (Non-Medical): No  Physical Activity: Inactive  . Days of Exercise per Week: 0 days  . Minutes of Exercise per Session: 0 min  Stress: No Stress Concern Present  . Feeling of Stress : Not at all  Social Connections:   . Frequency of Communication with Friends and Family:   . Frequency of Social Gatherings with Friends and Family:   . Attends Religious Services:     . Active Member of Clubs or Organizations:   . Attends Archivist Meetings:   Marland Kitchen Marital Status:    Past Surgical History:  Procedure Laterality Date  . EXCISION MORTON'S NEUROMA Right   . fallopian tube removal    . INTRATHECAL PUMP IMPLANT Right 02/18/2019   Procedure: INTRATHECAL PUMP IMPLANT;  Surgeon: Clydell Hakim, MD;  Location: [facility];  Service: Neurosurgery;  Laterality: Right;  INTRATHECAL PUMP IMPLANT  . WISDOM TOOTH EXTRACTION     Past Medical History:  Diagnosis Date  . Allergies   . Anxiety   . Arthritis   . Asthma   . Constipation   . Depression   . GERD (gastroesophageal reflux disease)   . Headache   . History of hiatal hernia   . Multiple sclerosis ([place])   . Vision abnormalities   . Wears glasses    BP 130/81   Pulse 77   Ht 5\' 9"  (1.753 m)   Wt 170 lb (77.1 kg) Comment: pt reported  SpO2 96%   BMI 25.10 kg/m   Opioid Risk Score:   Fall Risk Score:  `1  Depression screen PHQ 2/9  Depression screen PHQ 2/9 04/27/2019  Decreased Interest 1  Down, Depressed, Hopeless 1  PHQ - 2 Score 2  Altered sleeping 1  Tired, decreased energy 1  Change in appetite 1  Feeling bad or failure about yourself  0  Trouble concentrating 1  Moving slowly or fidgety/restless 2  Suicidal thoughts 0  PHQ-9 Score 8  Difficult doing work/chores Very difficult    Review of Systems  Constitutional: Negative.   HENT: Negative.   Eyes: Negative.   Respiratory: Negative.   Cardiovascular: Negative.   Gastrointestinal: Negative.   Endocrine: Negative.   Genitourinary: Negative.   Musculoskeletal: Positive for gait problem.       Spasms  Skin: Negative.   Allergic/Immunologic: Negative.   Neurological: Positive for tremors and weakness.       Tingling  Hematological: Negative.   Psychiatric/Behavioral: Positive for dysphoric mood. The patient is nervous/anxious.   All other systems reviewed and are negative.      Objective:   Physical Exam  Awake,  alert, appropriate, accompanied by husband, NAD In manual w/c wearing compression sleeve on L Grabbing L thigh and screaming intemittently due to pain.  Decreased sensation at tips of finger on L hand Sensation less in 5th L digit than 1/2nd digit 4th digit lateral/medial the same LEs MAS of 1+ to 2 in hips and knees      Assessment & Plan:   Patient is a 49 yr old female with secondary  progressive MS with L>R spasticity s/p ITB pump  1. Got w/c through [employer]- called and LM for [facility]- has been almost 5 months since ordered.   2. Once Virgel Bouquet has run out, IF still having breathing/pollen issues, take Advair 100/50 1 puff BID- suck it in 2x/day and rinse mouth afterwards.  Don't take at same time as Virgel Bouquet- do the same thing.   3. Con't ITB pump.   4. Went over options for nerve pain- will think about Lyrica for nerve pain.  And Let me know if wants it. Can document that she's tried everything else.    5. Lost 45 lbs since cut out carbs. In last year.    6. F/U in 3 months- or earlier if needed.  Started remodeling Estate agent- ongoing. Was supposed ot take 7-1 days Wants to do additional PT in near future- happy to write for this.  Went with vertical platform lift.  Let Cherie know.   I spent a total of 30 minutes on this visit- 20 minutes discussing options for nerve pain and resp issues.

## 2019-09-17 ENCOUNTER — Other Ambulatory Visit: Payer: Self-pay | Admitting: *Deleted

## 2019-09-17 DIAGNOSIS — R269 Unspecified abnormalities of gait and mobility: Secondary | ICD-10-CM

## 2019-09-17 DIAGNOSIS — G8220 Paraplegia, unspecified: Secondary | ICD-10-CM

## 2019-09-17 DIAGNOSIS — G35 Multiple sclerosis: Secondary | ICD-10-CM

## 2019-09-17 DIAGNOSIS — M62838 Other muscle spasm: Secondary | ICD-10-CM

## 2019-10-07 ENCOUNTER — Other Ambulatory Visit: Payer: Self-pay | Admitting: *Deleted

## 2019-10-07 DIAGNOSIS — B370 Candidal stomatitis: Secondary | ICD-10-CM

## 2019-10-07 DIAGNOSIS — G35 Multiple sclerosis: Secondary | ICD-10-CM

## 2019-10-07 DIAGNOSIS — R252 Cramp and spasm: Secondary | ICD-10-CM

## 2019-10-07 MED ORDER — NYSTATIN 100000 UNIT/ML MT SUSP: OROMUCOSAL | 0 refills | Status: DC

## 2019-10-08 ENCOUNTER — Encounter: Payer: Self-pay | Admitting: Neurology

## 2019-10-18 ENCOUNTER — Other Ambulatory Visit: Payer: Self-pay | Admitting: Neurology

## 2019-10-22 ENCOUNTER — Other Ambulatory Visit: Payer: Self-pay | Admitting: Neurology

## 2019-11-11 ENCOUNTER — Ambulatory Visit (INDEPENDENT_AMBULATORY_CARE_PROVIDER_SITE_OTHER): Payer: BLUE CROSS/BLUE SHIELD | Admitting: Neurology

## 2019-11-11 ENCOUNTER — Encounter: Payer: Self-pay | Admitting: Neurology

## 2019-11-11 VITALS — BP 130/87 | HR 88 | Ht 69.0 in | Wt 157.3 lb

## 2019-11-11 DIAGNOSIS — R208 Other disturbances of skin sensation: Secondary | ICD-10-CM

## 2019-11-11 DIAGNOSIS — G8220 Paraplegia, unspecified: Secondary | ICD-10-CM | POA: Diagnosis not present

## 2019-11-11 DIAGNOSIS — N3000 Acute cystitis without hematuria: Secondary | ICD-10-CM

## 2019-11-11 DIAGNOSIS — R269 Unspecified abnormalities of gait and mobility: Secondary | ICD-10-CM | POA: Diagnosis not present

## 2019-11-11 DIAGNOSIS — Z79899 Other long term (current) drug therapy: Secondary | ICD-10-CM

## 2019-11-11 DIAGNOSIS — G35 Multiple sclerosis: Secondary | ICD-10-CM | POA: Diagnosis not present

## 2019-11-11 MED ORDER — PREGABALIN 75 MG PO CAPS: 75.0000 mg | ORAL_CAPSULE | Freq: Two times a day (BID) | ORAL | 5 refills | Status: DC

## 2019-11-11 MED ORDER — NITROFURANTOIN MACROCRYSTAL 100 MG PO CAPS: 100.0000 mg | ORAL_CAPSULE | Freq: Two times a day (BID) | ORAL | 0 refills | Status: DC

## 2019-11-11 NOTE — Progress Notes (Signed)
[facility]  PATIENT: Sharon Odom DOB: [birth year]  REFERRING DOCTOR OR PCP:  Claretta Fraise SOURCE: Patient, notes imaging and lab reports, MRI images on CD from neurology,  _________________________________   HISTORICAL   CHIEF COMPLAINT:  Chief Complaint  Patient presents with  . Follow-up    Rm 12 with husband here to f/u from recent pain managment appt and home health certification. Also needs handicap placard renewed.     HISTORY OF PRESENT ILLNESS: Sharon Odom is a 49 y.o. woman with active/relapsing secondary progressive multiple sclerosis, left greater than right spasticity and poor gait.  Update 11/11/2019: Her last [name] was January 2021 (fourth course). She is compliant with the rems program. No evidence of ITP or renal insufficiency. She has had more UTI's.   The baclofen was reduced (has baclofen pump) then spasticity worsened.  Prialt was also added to her pump.    It was reduced due to more weakness.   She takes 2 pills baclofen a day now.     She is having spasticity in both legs, left >> right.   Her right sided spasticity is worse since January.   She is noting spasticity in her hands and legs.     I had a discussion about her new symptoms and the changes in her spasticity. Overall, she does feel that the baclofen pump has been helpful. Of note, she likely has a urinary tract infection given the increase white blood cells on her last urinalysis. That may be playing some role in her new symptoms.  She is having dry mouth and notes some difficulty swallowing food. She does best with a soft diet.     She has more stress and anxiety  MS history: She was diagnosed in October 2013 due to difficulty with her handwriting and right hand coordination.   A few weeks later she had gait ataxia and numbness and was referred to MS.   She had an MRI of the brain (was in [place] at the time) and then had a LP confirming the diagnosis.  Initially, she was  placed on Copaxone but had severe frequent exacerbations and then went to Tecfidera due to breakthrough exacerbations.  She was referred to [facility] for Tysabri (Feb 2014 to November 2016).  She then moved to [facility] and saw Dr. Ilene Qua [facility] [phone]).   She was always JCV Ab titer positive.    She did well on it but stopped 03/2015 due to safety risk.  She had more spasticity early 2017 that improved with 5 days IV Solumedrol and then started [facility] April 2017.   The infusion went well.    She started having more spasms in her legs later in 2017 and had Botox injections into her feet (some benefit but only for 3-4 weeks).   She had a second course of Botox November 2017 without much benefit.     She had numbness and tingling in her left arm November 2017 and was given an ESI.   Medical MJ did not help.   She had her second course of Lemtrada in April 2018 but the steroid did not help her spasticity as it did the first year.    She saw Dr. Gaynell Face at [facility] a couple times in 2018 and also saw Dr. Aquilla Hacker at [facility]. I started to see her in 2019.  A third course of Lemtrada was infused in October 2019.  A 4th course was infused January 2021.  Due to spasticity, she  had Botox injections, initially with benefit. However, due to continued symptoms, a baclofen pump placed September 2020. She is followed at [place] [facility] (Dr. Luan Pulling)  I have reviewed the MRI of the brain and cervical spine dated 03/19/2017.  The MRI of the brain shows multiple T2/FLAIR hyperintense foci in the cerebellum, pons, left greater than right thalamus and in the periventricular, juxtacortical and deep white matter of both hemispheres.    The MRI of the cervical spine showed a subtle focus Adjacent to C4-C5.  She has a disc protrusion at [facility]. MRI of the brain 03/31/2019 was felt to be slightly progressed compared to her previous MRI in 2019.  REVIEW OF SYSTEMS: Constitutional: No fevers, chills,  sweats, or change in appetite.  She reports fatigue. Eyes: As above Ear, nose and throat: No hearing loss, ear pain, nasal congestion, sore throat Cardiovascular: No chest pain, palpitations Respiratory: No shortness of breath at rest or with exertion.   No wheezes GastrointestinaI: No nausea, vomiting, diarrhea, abdominal pain, fecal incontinence Genitourinary:As above  musculoskeletal:She has pain in her legs, left greater than right due to spasticity.  She notes mild back pain. Integumentary: No rash, pruritus, skin lesions Neurological: as above Psychiatric: As above Endocrine: No palpitations, diaphoresis, change in appetite, change in weigh or increased thirst Hematologic/Lymphatic: No anemia, purpura, petechiae. Allergic/Immunologic: No itchy/runny eyes, nasal congestion, recent allergic reactions, rashes  ALLERGIES: Allergies  Allergen Reactions  . Morphine Other (See Comments)    HOME MEDICATIONS:  Current Outpatient Medications:  .  Acetaminophen (TYLENOL EXTRA STRENGTH PO), Take 1,000 mg by mouth every 6 (six) hours as needed. , Disp: , Rfl:  .  acetaminophen-codeine (TYLENOL #3) 300-30 MG tablet, Take by mouth every 4 (four) hours as needed for moderate pain., Disp: , Rfl:  .  albuterol (VENTOLIN HFA) 108 (90 Base) MCG/ACT inhaler, Inhale 1-2 puffs into the lungs every 6 (six) hours as needed for wheezing or shortness of breath., Disp: 6.7 g, Rfl: 1 .  Alemtuzumab (LEMTRADA) 12 MG/1.2ML SOLN, Inject 12 mg into the vein See admin instructions. Once a year, Disp: , Rfl:  .  baclofen (LIORESAL) 20 MG tablet, Take 20 mg by mouth 2 (two) times daily., Disp: , Rfl:  .  bisacodyl (DULCOLAX) 10 MG suppository, Place 1 suppository (10 mg total) rectally daily as needed for moderate constipation., Disp: 12 suppository, Rfl: 0 .  BOTOX 100 units SOLR injection, INJECT UP TO 600 UNITS INTO MUSCLES OF LOWER EXTREMITIES EVERY THREE MONTHS. DISCARD UNUSED PORTION., Disp: 600 Units,  Rfl: 0 .  buPROPion (WELLBUTRIN XL) 300 MG 24 hr tablet, Take 1 tablet (300 mg total) by mouth daily., Disp: 30 tablet, Rfl: 1 .  Cholecalciferol (VITAMIN D-3) 125 MCG (5000 UT) TABS, Take 5,000 Units by mouth daily., Disp: 30 tablet, Rfl: 1 .  Cyanocobalamin (VITAMIN B-12) 2500 MCG SUBL, Place 1 tablet (2,500 mcg total) under the tongue daily., Disp: 30 tablet, Rfl: 0 .  dalfampridine 10 MG TB12, Take 1 tablet (10 mg total) by mouth 2 (two) times daily. One po bid, Disp: 180 tablet, Rfl: 3 .  ibuprofen (ADVIL) 200 MG tablet, Take 400-800 mg by mouth every 6 (six) hours as needed. , Disp: , Rfl:  .  montelukast (SINGULAIR) 10 MG tablet, Take 1 tablet (10 mg total) by mouth daily., Disp: 30 tablet, Rfl: 1 .  naproxen sodium (ALEVE) 220 MG tablet, Take 220 mg by mouth 2 (two) times daily as needed. , Disp: , Rfl:  .  nystatin (MYCOSTATIN) 100000 UNIT/ML suspension, Take 54ml by mouth four times daily for 7 days, Disp: 140 mL, Rfl: 0 .  ondansetron (ZOFRAN) 4 MG tablet, Take 1 tablet (4 mg total) by mouth every 4 (four) hours as needed for nausea or vomiting., Disp: 180 tablet, Rfl: 1 .  oxybutynin (DITROPAN-XL) 10 MG 24 hr tablet, Take 1 tablet (10 mg total) by mouth daily., Disp: 30 tablet, Rfl: 1 .  oxyCODONE-acetaminophen (PERCOCET) 10-325 MG tablet, Take 1 tablet by mouth every 6 (six) hours as needed., Disp: , Rfl:  .  PRESCRIPTION MEDICATION, Baclofen pump 876.6 mcg/day, Disp: , Rfl:  .  ranitidine (ZANTAC) 300 MG tablet, Take 1 tablet (300 mg total) by mouth daily as needed for heartburn., Disp: 30 tablet, Rfl: 0 .  senna-docusate (SENOKOT-S) 8.6-50 MG tablet, Take 1 tablet by mouth daily. (Patient taking differently: Take 2 tablets by mouth daily. ), Disp: 30 tablet, Rfl: 0 .  valACYclovir (VALTREX) 500 MG tablet, Take 1 tablet (500 mg total) by mouth 2 (two) times daily., Disp: 180 tablet, Rfl: 1 .  Ziconotide Acetate (PRIALT IT), by Intrathecal route. 1.677 mcg/day, Disp: , Rfl:  .   nitrofurantoin (MACRODANTIN) 100 MG capsule, Take 1 capsule (100 mg total) by mouth 2 (two) times daily., Disp: 20 capsule, Rfl: 0  PAST MEDICAL HISTORY: Past Medical History:  Diagnosis Date  . Allergies   . Anxiety   . Arthritis   . Asthma   . Constipation   . Depression   . GERD (gastroesophageal reflux disease)   . Headache   . History of hiatal hernia   . Multiple sclerosis (HCC)   . Vision abnormalities   . Wears glasses     PAST SURGICAL HISTORY:   FAMILY HISTORY: Family History  Problem Relation Age of Onset  . Diabetes Mother   . Healthy Brother     SOCIAL HISTORY:  Social History   Socioeconomic History  . Marital status: Married    Spouse name: Not on file  . Number of children: Not on file  . Years of education: Not on file  . Highest education level: Not on file  Occupational History  . Not on file  Tobacco Use  . Smoking status: Current Every Day Smoker    Packs/day: 0.50    Years: 32.00    Pack years: 16.00    Types: Cigarettes    Start date: 58  . Smokeless tobacco: Never Used  Vaping Use  . Vaping Use: Former  Substance and Sexual Activity  . Alcohol use: Yes    Comment: 2-3 times per wk  . Drug use: Never  . Sexual activity: Not Currently  Other Topics Concern  . Not on file  Social History Narrative   Right handed    Caffeine: 1 cup or less per day- coffee   [employer]   Social Determinants of Health   Financial Resource Strain:   . Difficulty of Paying Living Expenses:   Food Insecurity: No Food Insecurity  . Worried About Programme researcher, broadcasting/film/video in the Last Year: Never true  . Ran Out of Food in the Last Year: Never true  Transportation Needs: No Transportation Needs  . Lack of Transportation (Medical): No  . Lack of Transportation (Non-Medical): No  Physical Activity: Inactive  . Days of Exercise per Week: 0 days  . Minutes of Exercise per Session: 0 min  Stress: No Stress Concern Present  . Feeling of Stress : Not at all  Social Connections:   . Frequency of Communication with Friends and Family:   . Frequency of Social Gatherings with Friends and Family:   . Attends Religious Services:   . Active Member of Clubs or Organizations:   . Attends Banker Meetings:   Marland Kitchen Marital Status:   Intimate Partner Violence: Not At Risk  . Fear of Current or Ex-Partner: No  . Emotionally Abused: No  . Physically Abused: No  . Sexually Abused: No     PHYSICAL EXAM  Vitals:   11/11/19 1509  BP: 130/87  Pulse: 88  Weight: 157 lb 5 oz (71.4 kg)  Height: 5\' 9"  (1.753 m)    Body mass index is 23.23 kg/m.   General: The patient is well-developed and well-nourished and in no acute distress  Neurologic Exam  Mental status: The patient is alert and oriented x 3 at the time of the examination. The patient has apparent normal recent and remote memory, with an apparently normal attention span and concentration ability.   Speech is normal.  Cranial nerves: Bilateral INO's, right greater than left.  Facial strength and sensation is normal.  The tongue is midline, and the patient has symmetric elevation of the soft palate. No obvious hearing deficits are noted.  Motor:   Spasticity is fairly resolved.  There were no flexion spasms.    She has mildly reduced in the left arm.  The right arm has normal muscle tone.  Strength is 4 / 5 in the left arm, 3/5 in the left leg and 4- to 4/5 in the right leg .     Sensory: Normal sensation to touch and vibration in the arms.  She has reduced sensation to vibration in the left leg.  Coordination: She has good right finger-nose-finger but reduced left finger-nose-finger.  HTS is poor on the right and she can't do on the left.    Gait and station: She is in a wheelchair needs support to stand up..    Reflexes: Deep tendon reflexes are now 1+ at the knees.  No clonus at the ankles    ASSESSMENT AND PLAN     1. Multiple sclerosis (HCC)   2. Spastic diplegia,  acquired, lower extremity (HCC)   3. Gait disturbance   4. Dysesthesia   5. High risk medication use   6. Acute cystitis without hematuria      1.  She has had 4 courses of Lemtrada for her MS.    She will need REMS labs x 4 years. 2.  Spasticity had done better with the baclofen pump but recent dose reduction led to worsening spasticity.  She can take extra baclofen pills for the time and should discuss further with Dr. .    She will see if she can get in with them sooner for an adjustment.   3.  Continue Ampyra 4.  Continue other medications 5.   Set up Home health PT.     6.    Nitrofurantoin for UTI.   Check culture. 7.   Follow-up in 6 months or sooner if there are new or worsening neurologic symptoms.  45-minute office visit with the majority of the time spent face-to-face for history and physical, discussion/counseling and decision-making.  Additional time with record review and documentation.   Sherrye Puga A. Ronda Fairly, MD, [facility] 11/11/2019, 3:40 PM Certified in Neurology, Clinical Neurophysiology, Sleep Medicine, Pain Medicine and Neuroimaging  [facility] [address], [address] [place], [place] [id number]

## 2019-11-15 ENCOUNTER — Other Ambulatory Visit: Payer: Self-pay | Admitting: Neurology

## 2019-11-15 LAB — URINALYSIS, ROUTINE W REFLEX MICROSCOPIC
Bilirubin, UA: NEGATIVE
Glucose, UA: NEGATIVE
Ketones, UA: NEGATIVE
Nitrite, UA: POSITIVE — AB
Protein,UA: NEGATIVE
RBC, UA: NEGATIVE
Specific Gravity, UA: 1.006 (ref 1.005–1.030)
Urobilinogen, Ur: 0.2 mg/dL (ref 0.2–1.0)
pH, UA: 6 (ref 5.0–7.5)

## 2019-11-15 LAB — URINE CULTURE

## 2019-11-15 LAB — MICROSCOPIC EXAMINATION
Bacteria, UA: NONE SEEN
Casts: NONE SEEN /lpf
RBC, Urine: NONE SEEN /hpf (ref 0–2)

## 2019-11-15 MED ORDER — CIPROFLOXACIN HCL 500 MG PO TABS: 500.0000 mg | ORAL_TABLET | Freq: Two times a day (BID) | ORAL | 0 refills | Status: DC

## 2019-11-16 ENCOUNTER — Telehealth: Payer: Self-pay | Admitting: Neurology

## 2019-11-16 NOTE — Telephone Encounter (Signed)
I called [facility] [phone]) and spoke with Alcario Drought to see about scheduling Botox delivery for patient's 11/25/19 appointment. Alcario Drought states that they have not been able to get consent for shipment from patient, but they will continue trying and call us back to schedule delivery when ready.

## 2019-11-17 NOTE — Telephone Encounter (Signed)
Per fax from [facility], no PA is needed for [id number], [id number], [phone] or [phone].

## 2019-11-17 NOTE — Telephone Encounter (Signed)
Pt called today & LVM at 3:53 pm asking for call back from [facility] in referrals. Call back [phone].

## 2019-11-17 NOTE — Telephone Encounter (Signed)
I called [facility] [phone]) and spoke with [facility] and scheduled delivery of Botox for 11/19/19. (6) 100U vials.

## 2019-11-19 ENCOUNTER — Other Ambulatory Visit: Payer: Self-pay | Admitting: *Deleted

## 2019-11-19 MED ORDER — FUROSEMIDE 20 MG PO TABS
20.0000 mg | ORAL_TABLET | ORAL | 5 refills | Status: DC
Start: 2019-11-19 — End: 2020-07-12

## 2019-11-19 MED ORDER — FLUCONAZOLE 150 MG PO TABS
ORAL_TABLET | ORAL | 0 refills | Status: DC
Start: 2019-11-19 — End: 2020-01-26

## 2019-11-19 NOTE — Telephone Encounter (Addendum)
Botox delivered today for 6/30 appointment from [facility]. (6) 100U vials.

## 2019-11-25 ENCOUNTER — Ambulatory Visit (INDEPENDENT_AMBULATORY_CARE_PROVIDER_SITE_OTHER): Payer: BLUE CROSS/BLUE SHIELD | Admitting: Neurology

## 2019-11-25 ENCOUNTER — Encounter: Payer: Self-pay | Admitting: Neurology

## 2019-11-25 ENCOUNTER — Encounter: Payer: Self-pay | Admitting: *Deleted

## 2019-11-25 ENCOUNTER — Telehealth: Payer: Self-pay | Admitting: Neurology

## 2019-11-25 ENCOUNTER — Other Ambulatory Visit: Payer: Self-pay

## 2019-11-25 VITALS — BP 115/79 | HR 85

## 2019-11-25 DIAGNOSIS — G35 Multiple sclerosis: Secondary | ICD-10-CM | POA: Diagnosis not present

## 2019-11-25 DIAGNOSIS — G8320 Monoplegia of upper limb affecting unspecified side: Secondary | ICD-10-CM

## 2019-11-25 DIAGNOSIS — G8220 Paraplegia, unspecified: Secondary | ICD-10-CM | POA: Diagnosis not present

## 2019-11-25 NOTE — Telephone Encounter (Signed)
Please change the Botox from 600 units to 300 units injection from November 25, 2019 and forward

## 2019-11-25 NOTE — Progress Notes (Signed)
PATIENT: Sharon Odom DOB: [birth year]  Chief Complaint  Patient presents with  . Spastic Diplegia, MS    Botox 100 units x 6 vials - specialty pharmacy     HISTORICAL   Sharon Odom is a 49 years old female, accompanied by her husband, referred by Dr. Epimenio Foot for evaluation of botulism toxin injection for spasticity due to multiple sclerosis, this is the first time I evaluated her, also performed injection today on April 18, 2018.  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  She received Lemtrada infusion in April 2017, second dose April 2018, due to continued mild progression of her symptoms, she complete her third Lemtrada infusion in October 2019.  She has been complaining of bilateral lower extremity, left upper extremity spasticity since late 2017, gradually getting worse, has tried different medications, including baclofen up to 120 mg daily, with limited benefit, she is not taking baclofen 80 mg, previously also tried and failed tizanidine, Trileptal, lower dose of botulism toxin a injection in the past only mild temporarily improvement.  She did not ambulate with walker at home, wheelchair for longer distance, complains painful frequent left lower extremity spasm, forceful left lower extremity extension, when she bear weight, she has left toes forceful painful flexion grabbing on the floor, also left shoulder, arm spasm, difficulty release left hand grip.  She was recently started on Valium 5 mg every 6 hours,  which has been helpful.  I personally reviewed the MRI of the brain and cervical spine in September 2019, the MRI of the brain shows multiple T2/FLAIR hyperintense foci in brainstem, cerebellum, left thalamus, hemisphere, consistent with typical chronic demyelinating plaque, no enhancement. MRI of cervical spine showed small T2/flair hyperintensity foci posterior to the left C3-4, several 45 also noted within the brainstem, multilevel mild degenerative changes.  No significant canal stenosis.   She also complains of urinary urgency, frequency, is taking Myrbetriq 50 mg, Wellbutrin 300 mg for depression, mild swallowing difficulty, swallowing evaluation is pending  UPDATE August 06 2018: She had first injection on April 18, 2018, we used Botox a 600 units, 400 units into spastic left lower extremity, 200 into left upper extremity,  Within 3 days, she notice weakness of left 1, 2 fingers, lasted for few weeks, she has difficulty using her left hand, but her left bicep pains has much improved,it also helped her left calf muscle spams.  She noticed increased left leg and shoulder muscle spasm since Jan 10.  UPDATE November 05 2018: She responded well to previous injection, reported 80% improvement of her left thigh muscle spasm, 50% improvement of her left shoulder, proximal arm muscle spasm, but the benefit of short lasting, she continue have bilateral toe flexion, painful, left worse than right, there was no improvement in her left calf muscle spasm, and pain.  UPDATE Sept 14 2020: She responded well to previous injection to left shoulder, but recently she complains of worsening left shoulder pain, is planning on to have evaluation and x-ray by her primary care physician, she denies significant improvement to  the spasticity of her left lower extremity, was recently evaluated by Dr. Ollen Bowl, planning on to have baclofen placement, per patient, low-dose baclofen intrathecal trial produced  miracles,  UPDATE May 14 2019: She reported significant improvement with botulism toxin injection to her left shoulder, mild improvement to spasticity left lower extremity  UPDATE August 17 2019: She responded well to previous injection, left shoulder, left calf muscle spasm has much improved, she received baclofen pump by Dr. Ollen Bowl in September 2020, is going through dose adjustment,  Update November 25, 2019: Baclofen pump that was placed by Dr. Ollen Bowl in September 2020 has been helpful, especially for her lower extremity, today she just want Botox injection for her spastic left upper extremity, complains of left shoulder pain, left hand finger flexion  REVIEW OF SYSTEMS: Full 14 system review of systems performed and notable only for as above All other review of systems were negative.  ALLERGIES: Allergies  Allergen Reactions  . Morphine Other (See Comments)    HOME MEDICATIONS: Current Outpatient Medications  Medication Sig Dispense Refill  . Acetaminophen (TYLENOL EXTRA STRENGTH PO) Take 1,000-1,300 mg by mouth every 6 (six) hours as needed.     Marland Kitchen acetaminophen-codeine (TYLENOL #3) 300-30 MG tablet Take by mouth every 4 (four) hours as needed for moderate pain.    Marland Kitchen albuterol (VENTOLIN HFA) 108 (90 Base) MCG/ACT inhaler Inhale 1-2 puffs into the lungs every 6 (six) hours as needed for wheezing or shortness of breath. 6.7 g 1  . Alemtuzumab (LEMTRADA) 12 MG/1.2ML SOLN Inject 12 mg into the vein See admin instructions. Once a year    . baclofen (LIORESAL) 20 MG tablet Take 20 mg by mouth 2 (two) times daily as needed.     . bisacodyl (DULCOLAX) 10 MG suppository Place 1 suppository (10 mg total) rectally daily as needed for moderate constipation. 12 suppository 0  . BOTOX 100 units SOLR injection INJECT UP TO 600 UNITS INTO MUSCLES OF LOWER EXTREMITIES EVERY THREE MONTHS. DISCARD UNUSED PORTION. 600 Units 0  . buPROPion (WELLBUTRIN XL) 300 MG 24 hr tablet Take 1 tablet (300 mg total)  by mouth daily. 30 tablet 1  . Cholecalciferol (VITAMIN D-3) 125 MCG (5000 UT) TABS Take 5,000 Units by mouth daily. 30 tablet 1  . Cyanocobalamin (VITAMIN B-12) 2500 MCG SUBL Place 1 tablet (2,500 mcg total) under the tongue daily. 30 tablet 0  . dalfampridine 10 MG TB12 Take 1 tablet (10 mg total) by mouth 2 (two) times daily. One po bid 180 tablet 3  . fluconazole (DIFLUCAN) 150 MG tablet Take 1 tablet daily for 7 days 7 tablet 0  . furosemide (LASIX) 20 MG tablet Take 1 tablet (20 mg total) by mouth every morning. 30 tablet 5  . ibuprofen (ADVIL) 200 MG tablet Take 400-800 mg by mouth every 6 (six) hours as needed.     . montelukast (SINGULAIR) 10 MG tablet Take 1 tablet (10 mg total) by mouth daily. 30 tablet 1  . naproxen sodium (ALEVE) 220 MG tablet Take 220 mg by mouth 2 (two) times daily as needed.     . nitrofurantoin (MACRODANTIN) 100 MG capsule Take 1 capsule (100 mg total) by mouth 2 (two) times daily. 20 capsule 0  . nystatin (MYCOSTATIN) 100000 UNIT/ML suspension Take 54ml by mouth four times daily for 7 days 140 mL 0  . ondansetron (ZOFRAN) 4 MG tablet Take 1 tablet (4 mg total) by mouth every 4 (four) hours as needed for nausea or vomiting. 180  tablet 1  . oxybutynin (DITROPAN-XL) 10 MG 24 hr tablet Take 1 tablet (10 mg total) by mouth daily. 30 tablet 1  . oxyCODONE-acetaminophen (PERCOCET) 10-325 MG tablet Take 1 tablet by mouth every 6 (six) hours as needed.    . pregabalin (LYRICA) 75 MG capsule Take 1 capsule (75 mg total) by mouth 2 (two) times daily. 60 capsule 5  . PRESCRIPTION MEDICATION Baclofen pump 876.6 mcg/day    . ranitidine (ZANTAC) 300 MG tablet Take 1 tablet (300 mg total) by mouth daily as needed for heartburn. 30 tablet 0  . senna-docusate (SENOKOT-S) 8.6-50 MG tablet Take 1 tablet by mouth daily. (Patient taking differently: Take 2 tablets by mouth daily. ) 30 tablet 0  . valACYclovir (VALTREX) 500 MG tablet Take 1 tablet (500 mg total) by mouth 2 (two) times  daily. 180 tablet 1  . Ziconotide Acetate (PRIALT IT) by Intrathecal route. 1.677 mcg/day     No current facility-administered medications for this visit.    PAST MEDICAL HISTORY: Past Medical History:  Diagnosis Date  . Allergies   . Anxiety   . Arthritis   . Asthma   . Constipation   . Depression   . GERD (gastroesophageal reflux disease)   . Headache   . History of hiatal hernia   . Multiple sclerosis (HCC)   . Vision abnormalities   . Wears glasses     PAST SURGICAL HISTORY: Past Surgical History:  Procedure Laterality Date  . EXCISION MORTON'S NEUROMA Right   . fallopian tube removal    . INTRATHECAL PUMP IMPLANT Right 02/18/2019   Procedure: INTRATHECAL PUMP IMPLANT;  Surgeon: Odette Fraction, MD;  Location: [facility] OR;  Service: Neurosurgery;  Laterality: Right;  INTRATHECAL PUMP IMPLANT  . WISDOM TOOTH EXTRACTION      FAMILY HISTORY: Family History  Problem Relation Age of Onset  . Diabetes Mother   . Healthy Brother     SOCIAL HISTORY: Social History   Socioeconomic History  . Marital status: Married    Spouse name: Not on file  . Number of children: Not on file  . Years of education: Not on file  . Highest education level: Not on file  Occupational History  . Not on file  Tobacco Use  . Smoking status: Current Every Day Smoker    Packs/day: 0.50    Years: 32.00    Pack years: 16.00    Types: Cigarettes    Start date: 69  . Smokeless tobacco: Never Used  Vaping Use  . Vaping Use: Former  Substance and Sexual Activity  . Alcohol use: Yes    Comment: 2-3 times per wk  . Drug use: Never  . Sexual activity: Not Currently  Other Topics Concern  . Not on file  Social History Narrative   Right handed    Caffeine: 1 cup or less per day- coffee   [employer]   Social Determinants of Health   Financial Resource Strain:   . Difficulty of Paying Living Expenses:   Food Insecurity: No Food Insecurity  . Worried About Programme researcher, broadcasting/film/video in the Last  Year: Never true  . Ran Out of Food in the Last Year: Never true  Transportation Needs: No Transportation Needs  . Lack of Transportation (Medical): No  . Lack of Transportation (Non-Medical): No  Physical Activity: Inactive  . Days of Exercise per Week: 0 days  . Minutes of Exercise per Session: 0 min  Stress: No Stress Concern Present  .  Feeling of Stress : Not at all  Social Connections:   . Frequency of Communication with Friends and Family:   . Frequency of Social Gatherings with Friends and Family:   . Attends Religious Services:   . Active Member of Clubs or Organizations:   . Attends Banker Meetings:   Marland Kitchen Marital Status:   Intimate Partner Violence: Not At Risk  . Fear of Current or Ex-Partner: No  . Emotionally Abused: No  . Physically Abused: No  . Sexually Abused: No     PHYSICAL EXAM   Vitals:   11/25/19 1417  BP: 115/79  Pulse: 85   Not recorded     There is no height or weight on file to calculate BMI.   Fairly normal right arm and right lower extremity movement, mild left arm weakness, fixation with rapid rotating movement, proximal and distal strength 4+, limited range of motion of left shoulder, difficulty extending left fingers after grip, left latissimus dorsi and pectoralis major muscle tightness, painful upon deep palpitation   DIAGNOSTIC DATA (LABS, IMAGING, TESTING) - I reviewed patient records, labs, notes, testing and imaging myself where available.   ASSESSMENT AND PLAN  Saydee Ririe is a 49 y.o. female    Relapsing Remitting Multiple Sclerosis, left spasticity, lower extremity more than left upper extremity. Status post baclofen pump placement by Dr. Norville Haggard in September 2020, which has helped her lower extremity spasticity,  We used BOTOX A 300 units today for spastic left upper extremity only  Under EMG 300 units to left upper extremity  Left pectoralis major 100 units Left latissimus dorsi100 units Left flexor  digitorum profundus 25 units Left flexor digitorum superficialis 25 units Left brachialis 25 units Left pronator teres 25 units  Patient tolerated injection well will return to clinic in 3 months for repeat injection, only needed Botox a 300 units  Levert Feinstein, M.D. Ph.D.  [facility] [address] [place], [place] [address] Ph: [phone] Fax: [phone]  CC: Referring Provider

## 2019-11-25 NOTE — Progress Notes (Signed)
**  Botox 100 units x 3 vials, NDC 1410-3013-14, Lot [id number], Exp 02/2022, specialty pharmacy.//mck,rn**

## 2019-11-26 NOTE — Telephone Encounter (Signed)
Noted  

## 2019-12-01 ENCOUNTER — Telehealth: Payer: Self-pay | Admitting: *Deleted

## 2019-12-01 NOTE — Telephone Encounter (Signed)
Faxed signed [facility] orders for PT 1WK7, 1every2WK2, OT effective 11/22/19 1WK5, [id number], 2GM0.  Fax: [phone]. Received fax confirmation.

## 2019-12-09 ENCOUNTER — Encounter: Payer: Self-pay | Admitting: Neurology

## 2019-12-11 ENCOUNTER — Ambulatory Visit: Payer: BC Managed Care – PPO | Admitting: Physical Medicine and Rehabilitation

## 2019-12-14 ENCOUNTER — Other Ambulatory Visit: Payer: Self-pay

## 2019-12-14 ENCOUNTER — Encounter: Payer: Self-pay | Admitting: Physical Medicine and Rehabilitation

## 2019-12-14 ENCOUNTER — Encounter
Payer: BLUE CROSS/BLUE SHIELD | Attending: Physical Medicine and Rehabilitation | Admitting: Physical Medicine and Rehabilitation

## 2019-12-14 ENCOUNTER — Other Ambulatory Visit: Payer: Self-pay | Admitting: Neurology

## 2019-12-14 VITALS — BP 110/70 | HR 84 | Temp 98.6°F | Ht 69.0 in | Wt 155.0 lb

## 2019-12-14 DIAGNOSIS — J45909 Unspecified asthma, uncomplicated: Secondary | ICD-10-CM | POA: Diagnosis present

## 2019-12-14 DIAGNOSIS — R269 Unspecified abnormalities of gait and mobility: Secondary | ICD-10-CM | POA: Diagnosis not present

## 2019-12-14 DIAGNOSIS — G8220 Paraplegia, unspecified: Secondary | ICD-10-CM

## 2019-12-14 DIAGNOSIS — G35 Multiple sclerosis: Secondary | ICD-10-CM

## 2019-12-14 DIAGNOSIS — E538 Deficiency of other specified B group vitamins: Secondary | ICD-10-CM | POA: Diagnosis present

## 2019-12-14 DIAGNOSIS — R471 Dysarthria and anarthria: Secondary | ICD-10-CM | POA: Insufficient documentation

## 2019-12-14 DIAGNOSIS — R490 Dysphonia: Secondary | ICD-10-CM

## 2019-12-14 DIAGNOSIS — Z978 Presence of other specified devices: Secondary | ICD-10-CM

## 2019-12-14 DIAGNOSIS — M62838 Other muscle spasm: Secondary | ICD-10-CM

## 2019-12-14 DIAGNOSIS — K5903 Drug induced constipation: Secondary | ICD-10-CM | POA: Diagnosis present

## 2019-12-14 DIAGNOSIS — R7989 Other specified abnormal findings of blood chemistry: Secondary | ICD-10-CM | POA: Diagnosis present

## 2019-12-14 DIAGNOSIS — R1310 Dysphagia, unspecified: Secondary | ICD-10-CM

## 2019-12-14 NOTE — Patient Instructions (Signed)
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 Hoarseness AND dysarthria; not just 1 issue.    1. Suggest that Nucynta might be helpful instead of using Oxycodone- suggest she speak to Dr Clovis Pu PA about thisMaralyn Odom. Since is an opioid, but also helps nerve pain. Can also try low dose Naltrexone as well- less sedating.  Will suggest LDN-ow dose naltrexone- 2-4 mg daily- can be compounded by compounding pharmacy[facility]  2. Voice is dysarthric and hoarse- concerned- could be resolving thrush- could also be oxycodone dosing.  Worse before botox- and now even worse- have had swallow study in 11/20-   3. Call ENT and get in- about hoarseness, more difficulty swallowing per pt.   3 choices for anxiety treatment  4. Can take low dose Xanax if on low dose Naltrexone- not with Nucynta or Oxycodone.   5. Or can add Buspar for anxiety. And wouldn't interact with any other meds.    6. Or change the Wellbutrin- to one that helps with depression AND anxiety- I use Paxil or Celexa.    7.  F/U in 2 months.

## 2019-12-14 NOTE — Progress Notes (Signed)
Subjective:    Patient ID: Sharon Odom, female    DOB: 1970-12-04, 49 y.o.   MRN: [id number]  HPI Patient is a 49 yr old female with secondary progressive MS with L>R spasticity s/p ITB pump Here for f/u.    Finally got the w/c- right after saw me, last- in April.   Got Neurology to write for Lyrica for her- 75 mg BID- not real helpful-   Preault- like Lyrica- in her ITB Pump- mixes it with Baclofen.  Also got Botox 11/25/19- in LUE per pt. 300 units  L pec, L lats dorso, L FDP, L FDS, l brachialis, L pronator teres.   At [facility], has ITB pump managed- just under 1000 mcg/day- Baclofen right now.   Couldn't walk due to nerve pain.  Has been decreased 2x in ITB pump-   Spasticity is unpredictable- so changes all the time.  Today and yesterday couldn't walk at all.  The tiny threshold of door, couldn't get over it.  Out of bathroom.- so got stuck in bathroom.  Because doesn't step- she slides feet and walker-   Is now on oxycodone for pain Took tylenol and advil- as well-  Pain is horrible.  Today, pain is still there- and cannot walk.    Experiencing new Sx's on R side now- but Dr Epimenio Foot is "against" her doing more Solumedrol.  Dr Epimenio Foot said she's secondary progressive; Dr Terrace Arabia has down she's still relapsing remitting MS.   discussed second opinion, because pt not sure she doesn't have relapsing remitting MS.    Has nerve pain in pretty much everywhere.   Dr Ollen Bowl has her on Oxycodone-   Voice is hoarse/dysarthric- - worse at night- started- April-ish- and if nose gets stuffed up, cannot talk at all- when tired, hard to understand.   More irritable- started in March 2021.   Anxiety goes through the roof.  Esp when having to call someone/for appointment-               Pain Inventory Average Pain 6 Pain Right Now 8 My pain is constant, sharp, burning, stabbing and tingling  In the last 24 hours, has pain interfered with the  following? General activity 7 Relation with others 9 Enjoyment of life 10 What TIME of day is your pain at its worst? Anytime of the day. Sleep (in general) Poor  Pain is worse with: sitting and standing Pain improves with: rest and medication Relief from Meds: Not helping much.  Mobility use a walker how many minutes can you walk? I do not know. ability to climb steps?  yes do you drive?  no transfers alone Do you have any goals in this area?  yes  Function disabled: date disabled 2013 I need assistance with the following:  dressing, meal prep, household duties and shopping Do you have any goals in this area?  yes  Neuro/Psych weakness numbness tremor tingling spasms dizziness depression anxiety loss of taste or smell  Prior Studies Any changes since last visit?  no  Physicians involved in your care Any changes since last visit?  no   Family History  Problem Relation Age of Onset  . Diabetes Mother   . Healthy Brother    Social History   Socioeconomic History  . Marital status: Married    Spouse name: Not on file  . Number of children: Not on file  . Years of education: Not on file  . Highest education level: Not on  file  Occupational History  . Not on file  Tobacco Use  . Smoking status: Current Every Day Smoker    Packs/day: 0.50    Years: 32.00    Pack years: 16.00    Types: Cigarettes    Start date: 19  . Smokeless tobacco: Never Used  Vaping Use  . Vaping Use: Former  Substance and Sexual Activity  . Alcohol use: Yes    Comment: 2-3 times per wk  . Drug use: Never  . Sexual activity: Not Currently  Other Topics Concern  . Not on file  Social History Narrative   Right handed    Caffeine: 1 cup or less per day- coffee   [employer]   Social Determinants of Health   Financial Resource Strain:   . Difficulty of Paying Living Expenses:   Food Insecurity: No Food Insecurity  . Worried About Programme researcher, broadcasting/film/video in the Last Year: Never  true  . Ran Out of Food in the Last Year: Never true  Transportation Needs: No Transportation Needs  . Lack of Transportation (Medical): No  . Lack of Transportation (Non-Medical): No  Physical Activity: Inactive  . Days of Exercise per Week: 0 days  . Minutes of Exercise per Session: 0 min  Stress: No Stress Concern Present  . Feeling of Stress : Not at all  Social Connections:   . Frequency of Communication with Friends and Family:   . Frequency of Social Gatherings with Friends and Family:   . Attends Religious Services:   . Active Member of Clubs or Organizations:   . Attends Banker Meetings:   Marland Kitchen Marital Status:    Past Surgical History:  Procedure Laterality Date  . EXCISION MORTON'S NEUROMA Right   . fallopian tube removal    . INTRATHECAL PUMP IMPLANT Right 02/18/2019   Procedure: INTRATHECAL PUMP IMPLANT;  Surgeon: Odette Fraction, MD;  Location: [facility] OR;  Service: Neurosurgery;  Laterality: Right;  INTRATHECAL PUMP IMPLANT  . WISDOM TOOTH EXTRACTION     Past Medical History:  Diagnosis Date  . Allergies   . Anxiety   . Arthritis   . Asthma   . Constipation   . Depression   . GERD (gastroesophageal reflux disease)   . Headache   . History of hiatal hernia   . Multiple sclerosis (HCC)   . Vision abnormalities   . Wears glasses    Ht 5\' 9"  (1.753 m)   Wt 155 lb (70.3 kg)   BMI 22.89 kg/m   Opioid Risk Score:   Fall Risk Score:  `1  Depression screen PHQ 2/9  Depression screen [facility] 2/9 09/07/2019 04/27/2019  Decreased Interest 1 1  Down, Depressed, Hopeless 1 1  PHQ - 2 Score 2 2  Altered sleeping - 1  Tired, decreased energy - 1  Change in appetite - 1  Feeling bad or failure about yourself  - 0  Trouble concentrating - 1  Moving slowly or fidgety/restless - 2  Suicidal thoughts - 0  PHQ-9 Score - 8  Difficult doing work/chores - Very difficult   Review of Systems  Constitutional: Negative.   HENT: Negative.   Eyes: Positive for visual  disturbance.  Respiratory: Positive for shortness of breath.   Gastrointestinal: Positive for nausea.  Endocrine: Negative.   Genitourinary: Negative.   Musculoskeletal: Positive for gait problem.  Skin: Negative.   Allergic/Immunologic: Negative.   Neurological: Positive for dizziness, tremors, weakness and numbness.  Psychiatric/Behavioral:  Depression, Anxiety       Objective:   Physical Exam Awake, appropriate, very slurred/dysarthric- hard to understand; in manual w/c; coice also quiet- couldn't understand more than 50% of words, accompanied by husband, NAD  No plaques of thrush on tongue or oropharynx- tongue a little irritated.   No increase DTRs- actually absent in UEs and LEs B/L Trace L ankle swelling- otherwise, edema much improved    Assessment & Plan:   Patient is a 49 yr old female with secondary progressive MS with L>R spasticity s/p ITB pump Here for f/u. Hoarseness AND dysarthria; not just 1 issue.    1. Suggest that Nucynta might be helpful instead of using Oxycodone- suggest she speak to Dr Clovis Pu PA about thisMaralyn Sago. Since is an opioid, but also helps nerve pain. Can also try low dose Naltrexone as well- less sedating.  Will suggest LDN-ow dose naltrexone- 2-4 mg daily- can be compounded by compounding pharmacy[facility] is [facility]  2. Voice is dysarthric and hoarse- concerned- could be resolving thrush- could also be oxycodone dosing.  Worse before botox- and now even worse- have had swallow study in 11/20-   3. Call ENT and get in- about hoarseness, more difficulty swallowing per pt.   3 choices for anxiety treatment  4. Can take low dose Xanax if on low dose Naltrexone- not with Nucynta or Oxycodone.   5. Or can add Buspar for anxiety. And wouldn't interact with any other meds.    6. Or change the Wellbutrin- to one that helps with depression AND anxiety- I use Paxil or Celexa.    7.  F/U in 2 months.    I spent a total of 45  minutes on visit- as detailed above.

## 2019-12-21 ENCOUNTER — Telehealth: Payer: Self-pay | Admitting: Neurology

## 2019-12-21 NOTE — Telephone Encounter (Signed)
Richard from [facility] called, needing a speech therapy evaluation verbal order form [facility]. Would like a call from the nurse.

## 2019-12-21 NOTE — Telephone Encounter (Signed)
Called, LVM providing VO for speech therapy evaluation per Dr. Epimenio Foot. Provided office # if he has further questions/concerns.

## 2020-01-05 ENCOUNTER — Emergency Department (HOSPITAL_COMMUNITY)
Admission: EM | Admit: 2020-01-05 | Discharge: 2020-01-06 | Disposition: A | Discharge status: Home / Self Care | Payer: BLUE CROSS/BLUE SHIELD | Attending: Emergency Medicine | Admitting: Emergency Medicine

## 2020-01-05 ENCOUNTER — Emergency Department (HOSPITAL_COMMUNITY): Payer: BLUE CROSS/BLUE SHIELD

## 2020-01-05 ENCOUNTER — Other Ambulatory Visit: Payer: Self-pay

## 2020-01-05 DIAGNOSIS — Z5321 Procedure and treatment not carried out due to patient leaving prior to being seen by health care provider: Secondary | ICD-10-CM | POA: Insufficient documentation

## 2020-01-05 DIAGNOSIS — M79669 Pain in unspecified lower leg: Secondary | ICD-10-CM | POA: Insufficient documentation

## 2020-01-05 DIAGNOSIS — R410 Disorientation, unspecified: Secondary | ICD-10-CM | POA: Diagnosis not present

## 2020-01-05 DIAGNOSIS — M6281 Muscle weakness (generalized): Secondary | ICD-10-CM | POA: Diagnosis present

## 2020-01-05 LAB — CBC
HCT: 42.6 % (ref 36.0–46.0)
Hemoglobin: 13.8 g/dL (ref 12.0–15.0)
MCH: 33.5 pg (ref 26.0–34.0)
MCHC: 32.4 g/dL (ref 30.0–36.0)
MCV: 103.4 fL — ABNORMAL HIGH (ref 80.0–100.0)
Platelets: 243 10*3/uL (ref 150–400)
RBC: 4.12 MIL/uL (ref 3.87–5.11)
RDW: 12.4 % (ref 11.5–15.5)
WBC: 7.9 10*3/uL (ref 4.0–10.5)
nRBC: 0 % (ref 0.0–0.2)

## 2020-01-05 LAB — COMPREHENSIVE METABOLIC PANEL
ALT: 19 U/L (ref 0–44)
AST: 18 U/L (ref 15–41)
Albumin: 4.1 g/dL (ref 3.5–5.0)
Alkaline Phosphatase: 61 U/L (ref 38–126)
Anion gap: 13 (ref 5–15)
BUN: 8 mg/dL (ref 6–20)
CO2: 26 mmol/L (ref 22–32)
Calcium: 10 mg/dL (ref 8.9–10.3)
Chloride: 102 mmol/L (ref 98–111)
Creatinine, Ser: 0.89 mg/dL (ref 0.44–1.00)
GFR calc Af Amer: >60 mL/min (ref >60)
GFR calc non Af Amer: >60 mL/min (ref >60)
Glucose, Bld: 99 mg/dL (ref 70–99)
Potassium: 4.2 mmol/L (ref 3.5–5.1)
Sodium: 141 mmol/L (ref 135–145)
Total Bilirubin: 1 mg/dL (ref 0.3–1.2)
Total Protein: 6.8 g/dL (ref 6.5–8.1)

## 2020-01-05 LAB — DIFFERENTIAL
Abs Immature Granulocytes: 0.02 10*3/uL (ref 0.00–0.07)
Basophils Absolute: 0 10*3/uL (ref 0.0–0.1)
Basophils Relative: 0 %
Eosinophils Absolute: 0.1 10*3/uL (ref 0.0–0.5)
Eosinophils Relative: 1 %
Immature Granulocytes: 0 %
Lymphocytes Relative: 8 %
Lymphs Abs: 0.6 10*3/uL — ABNORMAL LOW (ref 0.7–4.0)
Monocytes Absolute: 0.6 10*3/uL (ref 0.1–1.0)
Monocytes Relative: 7 %
Neutro Abs: 6.7 10*3/uL (ref 1.7–7.7)
Neutrophils Relative %: 84 %

## 2020-01-05 LAB — APTT: aPTT: 26 seconds (ref 24–36)

## 2020-01-05 LAB — PROTIME-INR
INR: 1 (ref 0.8–1.2)
Prothrombin Time: 12.7 seconds (ref 11.4–15.2)

## 2020-01-05 MED ORDER — SODIUM CHLORIDE 0.9% FLUSH
3.0000 mL | Freq: Once | INTRAVENOUS | Status: DC
Start: 2020-01-05 — End: 2020-01-06

## 2020-01-05 NOTE — ED Triage Notes (Signed)
Pt has hx of MS arrives to ED by pov with husband with a c/o of worsening left arm weakness and pain in both lower extremities. Pt states the weakness in her left arm began last Thursday and states she feels like she cant lift on grab anything. Per husband pt has also been more confused at times.

## 2020-01-18 ENCOUNTER — Telehealth: Payer: Self-pay | Admitting: *Deleted

## 2020-01-18 NOTE — Telephone Encounter (Signed)
Took call from phone staff and spoke with Sharon Odom (speech therapist w/ [facility]). She requested orders for can 6 visits for speech and swallowing therapy along with technology assisted devices. Received VO from Dr. Epimenio Foot for this. She verbalized understanding and appreciation.

## 2020-01-26 ENCOUNTER — Other Ambulatory Visit: Payer: Self-pay | Admitting: Neurology

## 2020-01-26 MED ORDER — FLUCONAZOLE 150 MG PO TABS: ORAL_TABLET | ORAL | 0 refills | Status: DC

## 2020-01-26 MED ORDER — BUSPIRONE HCL 15 MG PO TABS: 15.0000 mg | ORAL_TABLET | Freq: Two times a day (BID) | ORAL | 5 refills | Status: DC

## 2020-02-08 ENCOUNTER — Encounter: Payer: Self-pay | Admitting: Neurology

## 2020-02-08 ENCOUNTER — Encounter
Payer: BLUE CROSS/BLUE SHIELD | Attending: Physical Medicine and Rehabilitation | Admitting: Physical Medicine and Rehabilitation

## 2020-02-08 ENCOUNTER — Other Ambulatory Visit: Payer: Self-pay

## 2020-02-08 ENCOUNTER — Encounter: Payer: Self-pay | Admitting: Physical Medicine and Rehabilitation

## 2020-02-08 VITALS — BP 148/2 | HR 84 | Temp 97.8°F | Ht 69.0 in | Wt 142.4 lb

## 2020-02-08 DIAGNOSIS — E538 Deficiency of other specified B group vitamins: Secondary | ICD-10-CM | POA: Diagnosis present

## 2020-02-08 DIAGNOSIS — R7989 Other specified abnormal findings of blood chemistry: Secondary | ICD-10-CM | POA: Diagnosis present

## 2020-02-08 DIAGNOSIS — Z978 Presence of other specified devices: Secondary | ICD-10-CM

## 2020-02-08 DIAGNOSIS — R490 Dysphonia: Secondary | ICD-10-CM | POA: Diagnosis not present

## 2020-02-08 DIAGNOSIS — J45909 Unspecified asthma, uncomplicated: Secondary | ICD-10-CM | POA: Insufficient documentation

## 2020-02-08 DIAGNOSIS — G35 Multiple sclerosis: Secondary | ICD-10-CM | POA: Diagnosis not present

## 2020-02-08 DIAGNOSIS — R600 Localized edema: Secondary | ICD-10-CM | POA: Diagnosis present

## 2020-02-08 DIAGNOSIS — R471 Dysarthria and anarthria: Secondary | ICD-10-CM | POA: Diagnosis not present

## 2020-02-08 DIAGNOSIS — K5903 Drug induced constipation: Secondary | ICD-10-CM | POA: Insufficient documentation

## 2020-02-08 MED ORDER — BUPROPION HCL ER (XL) 300 MG PO TB24: 300.0000 mg | ORAL_TABLET | Freq: Every day | ORAL | 0 refills | Status: DC

## 2020-02-08 MED ORDER — MONTELUKAST SODIUM 10 MG PO TABS: 10.0000 mg | ORAL_TABLET | Freq: Every day | ORAL | 0 refills | Status: DC

## 2020-02-08 NOTE — Addendum Note (Signed)
Addended by: Genice Rouge on: 02/08/2020 03:10 PM   Modules accepted: Orders

## 2020-02-08 NOTE — Progress Notes (Addendum)
Subjective:     Patient ID: Sharon Odom, female   DOB: [birth year], 49 y.o.   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 Hoarseness AND dysarthria; not just 1 issue.   Has been ordered 6 visits for SLP outpatient.   Thinks eating and drinking has improved per pt with SLP- 4-5x so far.   Had to  fill pt in on last visit with me- pt didn't remember it.   So left the ER- because was told 21 hours- til would be seen.   Memory comes and goes per husband.   Put Preault into the ITB pump-  And Baclofen.   A few hallucinations- as well. There is a little guy underneath bed- pushing on couch or bed- chases her around.  Doesn't want her standing up or walking.  Walking is completely different when he's awake. Has never seen him. But knows he's there.   Has taken 2 pain pills today.  Still on oxycodone.    Dr Trudie Buckler doctor-   has retired and moved to [place].  Sent to new doctor-  Maralyn Sago is still taking care of pump- until sees new doctor- new appointment- in October.  Dr Maple Hudson.       They monitor her urine every month. For Lemtrada MS medication.   Check CBC with diff and U/A today.  Just started Buspar- last week- not sure if helping yet.     Pain Inventory Average Pain 6 Pain Right Now 8 My pain is constant, sharp, burning, dull, stabbing, tingling and aching  In the last 24 hours, has pain interfered with the following? General activity 10 Relation with others 0 Enjoyment of life 10 What TIME of day is your pain at its worst? varies Sleep (in general) Fair  Pain is worse with: walking, bending, sitting, inactivity, standing and some activites Pain improves with: rest and medication Relief from Meds: 10  Family History  Problem Relation Age of Onset  . Diabetes Mother   . Healthy Brother    Social History   Socioeconomic History  . Marital status: Married     Spouse name: Not on file  . Number of children: Not on file  . Years of education: Not on file  . Highest education level: Not on file  Occupational History  . Not on file  Tobacco Use  . Smoking status: Current Every Day Smoker    Packs/day: 0.50    Years: 32.00    Pack years: 16.00    Types: Cigarettes    Start date: 6  . Smokeless tobacco: Never Used  Vaping Use  . Vaping Use: Former  Substance and Sexual Activity  . Alcohol use: Yes    Comment: 2-3 times per wk  . Drug use: Never  . Sexual activity: Not Currently  Other Topics Concern  . Not on file  Social History Narrative   Right handed    Caffeine: 1 cup or less per day- coffee   [employer]   Social Determinants of Health   Financial Resource Strain:   . Difficulty of Paying Living Expenses: Not on file  Food Insecurity: No Food Insecurity  . Worried About Programme researcher, broadcasting/film/video in the Last Year: Never true  . Ran Out of Food in the Last Year: Never true  Transportation Needs: No Transportation Needs  . Lack of Transportation (Medical): No  . Lack  of Transportation (Non-Medical): No  Physical Activity: Inactive  . Days of Exercise per Week: 0 days  . Minutes of Exercise per Session: 0 min  Stress: No Stress Concern Present  . Feeling of Stress : Not at all  Social Connections:   . Frequency of Communication with Friends and Family: Not on file  . Frequency of Social Gatherings with Friends and Family: Not on file  . Attends Religious Services: Not on file  . Active Member of Clubs or Organizations: Not on file  . Attends Banker Meetings: Not on file  . Marital Status: Not on file   Past Surgical History:  Procedure Laterality Date  . EXCISION MORTON'S NEUROMA Right   . fallopian tube removal    . INTRATHECAL PUMP IMPLANT Right 02/18/2019   Procedure: INTRATHECAL PUMP IMPLANT;  Surgeon: Odette Fraction, MD;  Location: [facility] OR;  Service: Neurosurgery;  Laterality: Right;  INTRATHECAL PUMP IMPLANT   . WISDOM TOOTH EXTRACTION     Past Surgical History:  Procedure Laterality Date  . EXCISION MORTON'S NEUROMA Right   . fallopian tube removal    . INTRATHECAL PUMP IMPLANT Right 02/18/2019   Procedure: INTRATHECAL PUMP IMPLANT;  Surgeon: Odette Fraction, MD;  Location[facility] OR;  Service: Neurosurgery;  Laterality: Right;  INTRATHECAL PUMP IMPLANT  . WISDOM TOOTH EXTRACTION     Past Medical History:  Diagnosis Date  . Allergies   . Anxiety   . Arthritis   . Asthma   . Constipation   . Depression   . GERD (gastroesophageal reflux disease)   . Headache   . History of hiatal hernia   . Multiple sclerosis (HCC)   . Vision abnormalities   . Wears glasses    BP (!) 148/2   Pulse 84   Temp 97.8 F (36.6 C)   Ht 5\' 9"  (1.753 m)   Wt 142 lb 6.4 oz (64.6 kg)   SpO2 95%   BMI 21.03 kg/m   Opioid Risk Score:   Fall Risk Score:  `1  Depression screen PHQ 2/9  Depression screen [facility] 2/9 12/14/2019 09/07/2019 04/27/2019  Decreased Interest 0 1 1  Down, Depressed, Hopeless 0 1 1  PHQ - 2 Score 0 2 2  Altered sleeping - - 1  Tired, decreased energy - - 1  Change in appetite - - 1  Feeling bad or failure about yourself  - - 0  Trouble concentrating - - 1  Moving slowly or fidgety/restless - - 2  Suicidal thoughts - - 0  PHQ-9 Score - - 8  Difficult doing work/chores - - Very difficult   Review of Systems  Constitutional: Negative.   HENT: Negative.   Eyes: Negative.   Respiratory: Negative.   Cardiovascular: Negative.   Gastrointestinal: Positive for constipation.  Endocrine: Negative.   Musculoskeletal: Positive for arthralgias, gait problem and neck pain. Negative for back pain.  Skin: Negative.   Allergic/Immunologic: Negative.   Hematological: Negative.   Psychiatric/Behavioral: Negative.        Objective:   Physical Exam Awake, dysarthric, hard to understand, accompanied by husband, in manual w/c, NAD MAS of 0 to 1 in UEs and  LEs C/o spasms, but don't see any  today.       Assessment:     1. Secondary Progressive MS.  2. Abnormal gait with hallucinations,     Plan:     1. Not getting a good response to Oxycodone like did in past- before Prialt. - on  low dose Prialt and ITB Baclofen.   2.  I suggest trying without Prialt with ITB pump.  To see Maralyn Sago 1 more time.    3.  ITB Baclofen 2250 mcg/ml concentration.  Prialt 8 mcg/ml 920-966 mcg/day. Dose currently.  Due to get ITB pump refilled 03/01/2020  4. Prialt can cause hallucinations, dysarthria, Abnormal gait, memory loss, drowsiness, and irritability. Pt is having ALL these Sx's currently- suggest going WITHOUT Prialt at this time.   5. Also, double check for UTI- getting labs today already.   6.  F/U in 3 months- earlier if need be.   7. Lost PCP- over getting upset - per pt. So asking me to call in some meds for her. Singulair and Bupropion XL 300 mg daily.   8. Spent a lot of time discussing side effects of Prialt vs UTI.   9. LLE edema- will order Dopplers of LLE- will order to look for Dopplers and see if has DVT- if so, will treat with Eliquis, etc.   10. Make priority getting new PCP :)    I spent a total of 45 minutes on visit- as detailed above.

## 2020-02-08 NOTE — Patient Instructions (Addendum)
1. Secondary Progressive MS.  2. Abnormal gait with hallucinations,     Plan:     1. Not getting a good response to Oxycodone like did in past- before Prialt. - on low dose Prialt and ITB Baclofen.   2.  I suggest trying without Prialt with ITB pump.  To see Maralyn Sago 1 more time.    3.  ITB Baclofen 2250 mcg/ml concentration.  Prialt 8 mcg/ml 920-966 mcg/day. Dose currently.  Due to get ITB pump refilled 03/01/2020  4. Prialt can cause hallucinations, dysarthria, Abnormal gait, memory loss, drowsiness, and irritability. Pt is having ALL these Sx's currently- suggest going WITHOUT Prialt at this time.   5. Also, double check for UTI- getting labs today already.   6.  F/U in 3 months- earlier if need be.   7. Lost PCP- over getting upset - per pt. So asking me to call in some meds for her. Singulair and Bupropion XL 300 mg daily.   8. Spent a lot of time discussing side effects of Prialt vs UTI.

## 2020-02-09 MED ORDER — SULFAMETHOXAZOLE-TRIMETHOPRIM 800-160 MG PO TABS
1.0000 | ORAL_TABLET | Freq: Two times a day (BID) | ORAL | 0 refills | Status: DC
Start: 2020-02-09 — End: 2020-03-15

## 2020-02-09 NOTE — Telephone Encounter (Signed)
Called and LVM for pt to let her know Dr. Epimenio Foot reviewed her labs and it potentially shows UTI. He would like her to take Bactrim DS po BID for a week #14, 0 refills. Advised we sent this to [facility] for her. Asked her to call back if sx do not improve.

## 2020-02-17 ENCOUNTER — Other Ambulatory Visit: Payer: Self-pay | Admitting: *Deleted

## 2020-02-17 MED ORDER — BACLOFEN 20 MG PO TABS: ORAL_TABLET | ORAL | 3 refills | Status: DC

## 2020-02-19 ENCOUNTER — Other Ambulatory Visit: Payer: Self-pay | Admitting: Neurology

## 2020-03-01 ENCOUNTER — Ambulatory Visit: Payer: Medicare Other | Admitting: Neurology

## 2020-03-02 ENCOUNTER — Ambulatory Visit: Payer: Medicare Other | Admitting: Neurology

## 2020-03-08 ENCOUNTER — Encounter: Payer: Self-pay | Admitting: Neurology

## 2020-03-08 ENCOUNTER — Ambulatory Visit: Payer: Medicare Other | Admitting: Neurology

## 2020-03-09 ENCOUNTER — Encounter: Payer: Self-pay | Admitting: Neurology

## 2020-03-15 ENCOUNTER — Ambulatory Visit (INDEPENDENT_AMBULATORY_CARE_PROVIDER_SITE_OTHER): Payer: BLUE CROSS/BLUE SHIELD | Admitting: Neurology

## 2020-03-15 ENCOUNTER — Encounter: Payer: Self-pay | Admitting: Neurology

## 2020-03-15 ENCOUNTER — Other Ambulatory Visit: Payer: Self-pay

## 2020-03-15 VITALS — BP 107/67 | HR 67 | Ht 69.0 in | Wt 131.0 lb

## 2020-03-15 DIAGNOSIS — R208 Other disturbances of skin sensation: Secondary | ICD-10-CM | POA: Diagnosis not present

## 2020-03-15 DIAGNOSIS — R269 Unspecified abnormalities of gait and mobility: Secondary | ICD-10-CM | POA: Diagnosis not present

## 2020-03-15 DIAGNOSIS — G8220 Paraplegia, unspecified: Secondary | ICD-10-CM

## 2020-03-15 DIAGNOSIS — G35 Multiple sclerosis: Secondary | ICD-10-CM | POA: Diagnosis not present

## 2020-03-15 DIAGNOSIS — G5632 Lesion of radial nerve, left upper limb: Secondary | ICD-10-CM

## 2020-03-15 DIAGNOSIS — Z79899 Other long term (current) drug therapy: Secondary | ICD-10-CM

## 2020-03-15 DIAGNOSIS — F22 Delusional disorders: Secondary | ICD-10-CM | POA: Insufficient documentation

## 2020-03-15 HISTORY — DX: Lesion of radial nerve, left upper limb: G56.32

## 2020-03-15 NOTE — Progress Notes (Signed)
[facility]  PATIENT: Sharon Odom DOB: [birth year]  REFERRING DOCTOR OR PCP:  Claretta Fraise SOURCE: Patient, notes imaging and lab reports, MRI images on CD from neurology,  _________________________________   HISTORICAL   CHIEF COMPLAINT:  Chief Complaint  Patient presents with  . Follow-up    RM 12 with spouse. Last seen 11/11/19.  In [facility] in office today. Able to stand with 2 assist to obtain weight. She is concerned about possible drug interaction with oxycodone and buspiraone. Would like to discuss this.  . Multiple Sclerosis    On Lemtrada. Having left wrist drop (pics sent via mychart)    HISTORY OF PRESENT ILLNESS: Sharon Odom is a 49 y.o. woman with active/relapsing secondary progressive multiple sclerosis, left greater than right spasticity and poor gait.  Update 03/15/2020: She was admitted to [facility] (involuntarily committed initially but the released the next day).   She was having delusions and hallucinations.   She is on Baclofen 2250 mcg/mL and Prialt 4 mcg/mL at a rate of (baclofen) 869.8 mcg/day.   Dr. Maple Hudson is now managing her pump.   She and her husband feel that the delusions started shortly after starting prelude but worsened for the last month.  She does feel that the Prialt helped the pain.  Baclofen has helped the spasticity but she continues to have occasional tonic spasms that can be severe.  The more frequent tonic spasms in the feet have resolved.  She notes mildly increased tone in the legs.  She is able to take a few steps with a walker.  She has difficulty lifting the left leg.  She was walking better last year.  Her husband notes that her voice has become clear but harder to understand over the last month as well.  She woke up October 6 with a left wrist drop and left arm pain and hand numbness.   At baseline, the left arm is weaker than her right.    She does not think that she put any pressure at the spiral groove or  axilla.  There was no trauma.  The previous night seemed typical.  She notes no change over the last 2 weeks since the symptoms started.  Her last [name] was January 2021 (fourth course). She is compliant with the rems program.  She has not had any evidence of ITP or renal insufficiency.  She has occasional UTIs.   Besides the baclofen pump, she takes 2 pills baclofen a day now.     She is having spasticity in both legs, left >> right.   She has some spasticity in the arms, more on the left.  I had a discussion about her new symptoms and the changes in her spasticity. Overall, she does feel that the baclofen pump has been helpful. Of note, she likely has a urinary tract infection given the increase white blood cells on her last urinalysis. That may be playing some role in her new symptoms.  She is having dry mouth and notes some difficulty swallowing food. She does best with a soft diet.     MS history: She was diagnosed in October 2013 due to difficulty with her handwriting and right hand coordination.   A few weeks later she had gait ataxia and numbness and was referred to MS.   She had an MRI of the brain (was in [place] at the time) and then had a LP confirming the diagnosis.  Initially, she was placed on Copaxone but had severe  frequent exacerbations and then went to Tecfidera due to breakthrough exacerbations.  She was referred to [facility] for Tysabri (Feb 2014 to November 2016).  She then moved to [facility] and saw Dr. Ilene Qua [facility] [phone]).   She was always JCV Ab titer positive.    She did well on it but stopped 03/2015 due to safety risk.  She had more spasticity early 2017 that improved with 5 days IV Solumedrol and then started [facility] April 2017.   The infusion went well.    She started having more spasms in her legs later in 2017 and had Botox injections into her feet (some benefit but only for 3-4 weeks).   She had a second course of Botox November 2017 without much benefit.      She had numbness and tingling in her left arm November 2017 and was given an ESI.   Medical MJ did not help.   She had her second course of Lemtrada in April 2018 but the steroid did not help her spasticity as it did the first year.    She saw Dr. Gaynell Face at [facility] a couple times in 2018 and also saw Dr. Aquilla Hacker at [facility]. I started to see her in 2019.  A third course of Lemtrada was infused in October 2019.  A 4th course was infused January 2021.  Due to spasticity, she had Botox injections, initially with benefit. However, due to continued symptoms, a baclofen pump placed September 2020. She is followed at [facility] (Dr. Juanetta Gosling)  Imaging:  MRI of the brain 03/19/2017 shows multiple T2/FLAIR hyperintense foci in the cerebellum, pons, left greater than right thalamus and in the periventricular, juxtacortical and deep white matter of both hemispheres.      MRI of the brain 03/31/2019 was felt to be slightly progressed compared to her previous MRI in 2019.  However, by my interpretation the MRI is stable.  It shows T2/FLAIR hyperintense foci in the hemispheres, thalamus, pons and cerebellum consistent with MS  MRI of the cervical and thoracic spine 03/31/2019 showed a discrete lesion posteriorly to the left at C3-C4 and subtle patchy multifocal cord signal abnormalities in the cervical spine.  By my interpretation the MRI is stable compared to 2019.  The thoracic spinal cord was normal.  REVIEW OF SYSTEMS: Constitutional: No fevers, chills, sweats, or change in appetite.  She reports fatigue. Eyes: As above Ear, nose and throat: No hearing loss, ear pain, nasal congestion, sore throat Cardiovascular: No chest pain, palpitations Respiratory: No shortness of breath at rest or with exertion.   No wheezes GastrointestinaI: No nausea, vomiting, diarrhea, abdominal pain, fecal incontinence Genitourinary:As above  musculoskeletal:She has pain in her legs, left greater  than right due to spasticity.  She notes mild back pain. Integumentary: No rash, pruritus, skin lesions Neurological: as above Psychiatric: As above Endocrine: No palpitations, diaphoresis, change in appetite, change in weigh or increased thirst Hematologic/Lymphatic: No anemia, purpura, petechiae. Allergic/Immunologic: No itchy/runny eyes, nasal congestion, recent allergic reactions, rashes  ALLERGIES: Allergies  Allergen Reactions  . Morphine Other (See Comments)    HOME MEDICATIONS:  Current Outpatient Medications:  .  Acetaminophen (TYLENOL EXTRA STRENGTH PO), Take 1,000-1,300 mg by mouth every 6 (six) hours as needed. , Disp: , Rfl:  .  albuterol (VENTOLIN HFA) 108 (90 Base) MCG/ACT inhaler, Inhale 1-2 puffs into the lungs every 6 (six) hours as needed for wheezing or shortness of breath., Disp: 6.7 g, Rfl: 1 .  Alemtuzumab (LEMTRADA) 12 MG/1.2ML SOLN,  Inject 12 mg into the vein See admin instructions. Once a year, Disp: , Rfl:  .  baclofen (LIORESAL) 20 MG tablet, Take up to 6 pills daily for MS spasticity, Disp: 540 each, Rfl: 3 .  bisacodyl (DULCOLAX) 10 MG suppository, Place 1 suppository (10 mg total) rectally daily as needed for moderate constipation., Disp: 12 suppository, Rfl: 0 .  BOTOX 100 units SOLR injection, INJECT UP TO 600 UNITS INTO MUSCLES OF LOWER EXTREMITIES EVERY THREE MONTHS. DISCARD UNUSED PORTION., Disp: 600 Units, Rfl: 0 .  buPROPion (WELLBUTRIN XL) 300 MG 24 hr tablet, Take 1 tablet (300 mg total) by mouth daily., Disp: 90 tablet, Rfl: 0 .  busPIRone (BUSPAR) 15 MG tablet, Take 1 tablet (15 mg total) by mouth 2 (two) times daily., Disp: 60 tablet, Rfl: 5 .  Cholecalciferol (VITAMIN D-3) 125 MCG (5000 UT) TABS, Take 5,000 Units by mouth daily., Disp: 30 tablet, Rfl: 1 .  Cyanocobalamin (VITAMIN B-12) 2500 MCG SUBL, Place 1 tablet (2,500 mcg total) under the tongue daily., Disp: 30 tablet, Rfl: 0 .  dalfampridine 10 MG TB12, Take 1 tablet (10 mg total) by mouth  2 (two) times daily. One po bid, Disp: 180 tablet, Rfl: 3 .  furosemide (LASIX) 20 MG tablet, Take 1 tablet (20 mg total) by mouth every morning., Disp: 30 tablet, Rfl: 5 .  ibuprofen (ADVIL) 200 MG tablet, Take 900 mg by mouth every 6 (six) hours as needed. , Disp: , Rfl:  .  montelukast (SINGULAIR) 10 MG tablet, Take 1 tablet (10 mg total) by mouth daily., Disp: 90 tablet, Rfl: 0 .  naproxen sodium (ALEVE) 220 MG tablet, Take 220 mg by mouth 2 (two) times daily as needed. , Disp: , Rfl:  .  ondansetron (ZOFRAN) 4 MG tablet, Take 1 tablet (4 mg total) by mouth every 4 (four) hours as needed for nausea or vomiting., Disp: 180 tablet, Rfl: 1 .  oxybutynin (DITROPAN-XL) 10 MG 24 hr tablet, Take 1 tablet (10 mg total) by mouth daily., Disp: 30 tablet, Rfl: 1 .  Oxycodone HCl 10 MG TABS, Take 10 mg by mouth every 4 (four) hours as needed., Disp: , Rfl:  .  pregabalin (LYRICA) 75 MG capsule, Take 1 capsule (75 mg total) by mouth 2 (two) times daily., Disp: 60 capsule, Rfl: 5 .  ranitidine (ZANTAC) 300 MG tablet, Take 1 tablet (300 mg total) by mouth daily as needed for heartburn., Disp: 30 tablet, Rfl: 0 .  senna-docusate (SENOKOT-S) 8.6-50 MG tablet, Take 1 tablet by mouth daily. (Patient taking differently: Take 2 tablets by mouth daily. ), Disp: 30 tablet, Rfl: 0 .  Ziconotide Acetate (PRIALT IT), by Intrathecal route. 1.677 mcg/day, Disp: , Rfl:   PAST MEDICAL HISTORY: Past Medical History:  Diagnosis Date  . Allergies   . Anxiety   . Arthritis   . Asthma   . Constipation   . Depression   . GERD (gastroesophageal reflux disease)   . Headache   . History of hiatal hernia   . Left radial nerve palsy 03/15/2020  . Multiple sclerosis (HCC)   . Vision abnormalities   . Wears glasses     PAST SURGICAL HISTORY:   FAMILY HISTORY: Family History  Problem Relation Age of Onset  . Diabetes Mother   . Healthy Brother     SOCIAL HISTORY:  Social History   Socioeconomic History  .  Marital status: Married    Spouse name: Not on file  . Number of children:  Not on file  . Years of education: Not on file  . Highest education level: Not on file  Occupational History  . Not on file  Tobacco Use  . Smoking status: Current Every Day Smoker    Packs/day: 0.50    Years: 32.00    Pack years: 16.00    Types: Cigarettes    Start date: 19  . Smokeless tobacco: Never Used  Vaping Use  . Vaping Use: Former  Substance and Sexual Activity  . Alcohol use: Yes    Comment: 2-3 times per wk  . Drug use: Never  . Sexual activity: Not Currently  Other Topics Concern  . Not on file  Social History Narrative   Right handed    Caffeine: 1 cup or less per day- coffee   [employer]   Social Determinants of Health   Financial Resource Strain:   . Difficulty of Paying Living Expenses: Not on file  Food Insecurity: No Food Insecurity  . Worried About Programme researcher, broadcasting/film/video in the Last Year: Never true  . Ran Out of Food in the Last Year: Never true  Transportation Needs: No Transportation Needs  . Lack of Transportation (Medical): No  . Lack of Transportation (Non-Medical): No  Physical Activity: Inactive  . Days of Exercise per Week: 0 days  . Minutes of Exercise per Session: 0 min  Stress: No Stress Concern Present  . Feeling of Stress : Not at all  Social Connections:   . Frequency of Communication with Friends and Family: Not on file  . Frequency of Social Gatherings with Friends and Family: Not on file  . Attends Religious Services: Not on file  . Active Member of Clubs or Organizations: Not on file  . Attends Banker Meetings: Not on file  . Marital Status: Not on file  Intimate Partner Violence: Not At Risk  . Fear of Current or Ex-Partner: No  . Emotionally Abused: No  . Physically Abused: No  . Sexually Abused: No     PHYSICAL EXAM  Vitals:   03/15/20 1448  BP: 107/67  Pulse: 67  Weight: 131 lb (59.4 kg)  Height: 5\' 9"  (1.753 m)    Body  mass index is 19.35 kg/m.   General: The patient is well-developed and well-nourished and in no acute distress  Neurologic Exam  Mental status: The patient is alert and oriented x 3 at the time of the examination. The patient has apparent normal recent and remote memory, with an apparently normal attention span and concentration ability.   Speech is normal.  Cranial nerves: Bilateral INO's, right greater than left.  Facial strength and sensation is normal.  The tongue is midline, and the patient has symmetric elevation of the soft palate. No obvious hearing deficits are noted.  Motor:   Spasticity is fairly resolved.  There were no flexion spasms.   Little strength was 0/5 in the radial innervated left arm muscles and 4/5 in the left triceps and other muscles in the arm.  The right arm has normal muscle tone and it was slightly increased proximally in the left arm.  Strength is 2+ to 3/5 in the left leg and 4- to 4/5 in the right leg .     Sensory: Normal sensation to touch and vibration in the arms.  She has reduced sensation to vibration in the left leg.  Coordination: She has good right finger-nose-finger but reduced left finger-nose-finger.  HTS is poor on the right and  she can't do on the left.    Gait and station: With support, she is able to stand and take a few steps.  She has difficulty lifting the left leg up.    Reflexes: Deep tendon reflexes are now 1+ at the knees.  No clonus at the ankles    ASSESSMENT AND PLAN     1. Multiple sclerosis (HCC)   2. Spastic diplegia, acquired, lower extremity (HCC)   3. Gait disturbance   4. Dysesthesia   5. High risk medication use   6. Left radial nerve palsy   7. Delusions (HCC)      1.  She has had 4 courses of Lemtrada for her MS.    She will need REMS labs until 2025 2.  Spasticity had done better with the baclofen pump.  I believe her current delusions are due to the addition of Prialt and she is going to have the current  solution removed and baclofen monotherapy started soon  3.  Check NCV/CMG for the left arm weakness.  This likely is due to a radial nerve palsy but due to her MS we need to also rule out demyelination or other  etiology.   4.  Continue Ampyra and other medications 5.   Follow-up in 6 months or sooner if there are new or worsening neurologic symptoms.  45-minute office visit with the majority of the time spent face-to-face for history and physical, discussion/counseling and decision-making.  Additional time with record review and documentation.  Angelly Spearing A. Epimenio Foot, MD, [facility] 03/15/2020, 5:29 PM Certified in Neurology, Clinical Neurophysiology, Sleep Medicine, Pain Medicine and Neuroimaging  [facility] [address] [place], [place] [address] [phone]

## 2020-03-15 NOTE — Telephone Encounter (Signed)
Called and spoke with pt. Offered sooner appt today at 3pm with Dr. Epimenio Foot (he had cx). She accepted. Advised her to check in by 2:30pm. She verbalized understanding.

## 2020-03-16 ENCOUNTER — Telehealth: Payer: Self-pay | Admitting: Neurology

## 2020-03-16 ENCOUNTER — Ambulatory Visit: Payer: Self-pay | Admitting: Neurology

## 2020-03-16 NOTE — Telephone Encounter (Signed)
Medicare/bcbs Berkley Harvey: [id number] (exp. 03/16/20 to 05/14/20) order sent to GI. They will reach out to the patient to schedule.

## 2020-03-17 ENCOUNTER — Other Ambulatory Visit: Payer: Self-pay | Admitting: *Deleted

## 2020-03-17 MED ORDER — SULFAMETHOXAZOLE-TRIMETHOPRIM 800-160 MG PO TABS
ORAL_TABLET | ORAL | 0 refills | Status: DC
Start: 2020-03-17 — End: 2020-07-12

## 2020-03-21 ENCOUNTER — Telehealth: Payer: Self-pay | Admitting: Neurology

## 2020-03-21 NOTE — Telephone Encounter (Signed)
FYI

## 2020-03-21 NOTE — Telephone Encounter (Signed)
Terri @ [facility] has called to report that pt will be discharged this week, it there are questions Terri @ [facility] can be reached at [phone], this will be done by OT

## 2020-04-05 ENCOUNTER — Ambulatory Visit
Admission: RE | Admit: 2020-04-05 | Discharge: 2020-04-05 | Disposition: A | Discharge status: Home / Self Care | Payer: BLUE CROSS/BLUE SHIELD | Source: Ambulatory Visit | Attending: Neurology | Admitting: Neurology

## 2020-04-05 ENCOUNTER — Other Ambulatory Visit: Payer: Self-pay

## 2020-04-05 DIAGNOSIS — G35 Multiple sclerosis: Secondary | ICD-10-CM | POA: Diagnosis not present

## 2020-04-05 MED ORDER — GADOBENATE DIMEGLUMINE 529 MG/ML IV SOLN
12.0000 mL | Freq: Once | INTRAVENOUS | Status: AC | PRN
  Administered 2020-04-05: 12 mL via INTRAVENOUS

## 2020-04-07 ENCOUNTER — Telehealth: Payer: Self-pay | Admitting: *Deleted

## 2020-04-07 NOTE — Telephone Encounter (Signed)
-----   Message from Asa Lente, MD sent at 04/06/2020 10:59 PM EST ----- Please let her know that I took a look at the MRI of the brain and cervical spine and compared to the ones that she had last year.  There are no new lesions.

## 2020-04-19 ENCOUNTER — Ambulatory Visit (INDEPENDENT_AMBULATORY_CARE_PROVIDER_SITE_OTHER): Payer: BLUE CROSS/BLUE SHIELD | Admitting: Neurology

## 2020-04-19 ENCOUNTER — Ambulatory Visit: Payer: BLUE CROSS/BLUE SHIELD | Admitting: Neurology

## 2020-04-19 ENCOUNTER — Encounter (INDEPENDENT_AMBULATORY_CARE_PROVIDER_SITE_OTHER): Payer: BLUE CROSS/BLUE SHIELD | Admitting: Neurology

## 2020-04-19 DIAGNOSIS — R208 Other disturbances of skin sensation: Secondary | ICD-10-CM

## 2020-04-19 DIAGNOSIS — G5632 Lesion of radial nerve, left upper limb: Secondary | ICD-10-CM

## 2020-04-19 DIAGNOSIS — Z0289 Encounter for other administrative examinations: Secondary | ICD-10-CM

## 2020-04-19 NOTE — Progress Notes (Signed)
Full Name: Sharon Odom Gender: Female MRN #: [id number] Date of Birth: [birth year]    Visit Date: 04/19/2020 09:36 Age: 49 Years Examining Physician: Despina Arias, MD  Referring Physician: Despina Arias, MD Height: 5 feet 9 inch Patient History: 131lbs    History: She is a 49 year old woman with multiple sclerosis who had the onset of left wrist drop and finger extensor weakness 03/02/2020.  At baseline, the left arm is weaker than the right but she has had reasonably good strength with wrist extension.  She had no trauma but did note a little pain at the spiral groove.  She also has dysesthesias in the feet and hands.  On exam, strength is 0-1/5 in the radially innervated muscles on the left.  Nerve conduction studies: The left radial motor response had a normal distal latency, low normal amplitude and normal forearm conduction.  However, the amplitude was less than one half that of the right radial motor response.  The left ulnar motor response had a reduced amplitude with stimulation at the wrist but normal amplitude in the forearm.  Distal latency and conduction velocity was normal.  The left median motor response was normal.  The left peroneal and tibial motor responses had normal distal latencies but reduced amplitudes and normal conduction velocities.  The left radial, median, ulnar, superficial peroneal and sural sensory responses had normal peak latencies and amplitudes.  Electromyography: Needle EMG of selected muscles of the left arm and leg was performed.  In the arm, there was spontaneous activity consistent with acute denervation in the radially innervated EDC and EIP muscles.  There was a discrete recruitment pattern in the [facility] and no voluntary activity in the EIP.  Mild chronic denervation was noted in the left triceps.  Other muscles in the arm was normal.  In the left leg, there was mild chronic elevation in the left gastrocnemius muscle while other muscles tested  appeared normal.  Impression: This NCV/EMG study shows the following: 1.   Severe subacute left radial neuropathy.  Voluntary motor units were noted in the EDC muscle. 2.   No evidence of significant radiculopathy.  Sharon Odom A. Sharon Foot, MD, PhD, FAAN Certified in Neurology, Clinical Neurophysiology, Sleep Medicine, Pain Medicine and Neuroimaging Director, [facility]  [facility] [address] [place], [place] [address] [phone]   Clinical note: She asked about surgery for the radial nerve.  At this point, she shows evidence that the nerve was not transected.  Therefore, she has a good likelihood of improving.  If the improvement is limited, consider referral to a peripheral nerve orthopedic specialist.    --  RAS   Verbal informed consent was obtained from the patient, patient was informed of potential risk of procedure, including bruising, bleeding, hematoma formation, infection, muscle weakness, muscle pain, numbness, among others.        MNC    Nerve / Sites Muscle Latency Ref. Amplitude Ref. Rel Amp Segments Distance Velocity Ref. Area    ms ms mV mV %  cm m/s m/s mVms  L Median - APB     Wrist APB 4.2 ?4.4 9.2 ?4.0 100 Wrist - APB 7   48.2     Upper arm APB 8.2  9.1  98.6 Upper arm - Wrist 21 52 ?49 47.1  L Ulnar - ADM     Wrist ADM 2.9 ?3.3 1.6 ?6.0 100 Wrist - ADM 7   4.1  B.Elbow ADM 6.8  5.5  340 B.Elbow - Wrist 19 49 ?49 26.1     A.Elbow ADM 8.9  5.1  94.3 A.Elbow - B.Elbow 10 49 ?49 21.7  L Peroneal - EDB     Ankle EDB 5.0 ?6.5 1.5 ?2.0 100 Ankle - EDB 9   8.2     Fib head EDB 12.1  1.5  99.1 Fib head - Ankle 28 40 ?44 7.3     Pop fossa EDB 14.6  1.4  95.4 Pop fossa - Fib head 10 39 ?44 7.3         Pop fossa - Ankle      L Tibial - AH     Ankle AH 4.8 ?5.8 1.7 ?4.0 100 Ankle - AH 9   8.7     Pop fossa AH 13.8  1.3  76.6 Pop fossa - Ankle 40 44 ?41 10.7  L Radial - EIP     Forearm EIP  2.5 ?2.9 3.0 ?2.0 100 Forearm - EIP 4  ?49 27.4     Elbow EIP 3.7  2.9  97.9 Elbow - Forearm 8 70  25.4     Spiral Gr EIP 6.4  3.4  115 Spiral Gr - Elbow 14 52  26.6  R Radial - EIP     Forearm EIP 1.8 ?2.9 7.9 ?2.0 100 Forearm - EIP 4  ?49 39.5     Elbow EIP 4.2  8.4  106 Elbow - Forearm 14 60  42.5     Spiral Gr EIP 6.1  8.9  105 Spiral Gr - Elbow 11 56  44.4                 SNC    Nerve / Sites Rec. Site Peak Lat Ref.  Amp Ref. Segments Distance    ms ms V V  cm  L Radial - Anatomical snuff box (Forearm)     Forearm Wrist 2.6 ?2.9 62 ?15 Forearm - Wrist 10  L Sural - Ankle (Calf)     Calf Ankle 3.8 ?4.4 10 ?6 Calf - Ankle 14  L Superficial peroneal - Ankle     Lat leg Ankle 4.4 ?4.4 9 ?6 Lat leg - Ankle 14  L Median - Orthodromic (Dig II, Mid palm)     Dig II Wrist 3.3 ?3.4 22 ?10 Dig II - Wrist 13  L Ulnar - Orthodromic, (Dig V, Mid palm)     Dig V Wrist 3.1 ?3.1 10 ?5 Dig V - Wrist 56               F  Wave    Nerve F Lat Ref.   ms ms  L Ulnar - ADM 29.9 ?32.0  L Tibial - AH 57.9 ?56.0         EMG Summary Table    Spontaneous MUAP Recruitment  Muscle IA Fib PSW Fasc Other Amp Dur. Poly Pattern  L. Deltoid Normal None None None _______ Normal Normal Normal Normal  L. Triceps brachii Normal None None None _______ Normal Normal 1+ Reduced  L. Biceps brachii Normal None None None _______ Normal Normal Normal Normal  L. Extensor digitorum communis Normal 2+ 1+ None _______ Normal Normal 3+ Discrete  L. First dorsal interosseous Normal None None None _______ Normal Normal Normal Normal  L. Abductor digiti minimi (manus) Normal None None None _______ Normal Normal Normal Normal  L. Extensor indicis proprius Normal 2+ 1+ None _______ Normal Normal Normal No Activity  L. Vastus medialis Normal None None None _______ Normal Normal Normal Single  L. Tibialis anterior Normal None None None _______ Normal Normal Normal Normal  L. Gastrocnemius (Medial head) Normal None None None  _______ Normal Normal 1+ Reduced  L. Peroneus longus Normal None None None _______ Normal Normal Normal Normal  L. Vastus lateralis Normal None None None _______ Normal Normal Normal Normal

## 2020-04-20 ENCOUNTER — Other Ambulatory Visit: Payer: Self-pay | Admitting: Physical Medicine and Rehabilitation

## 2020-05-09 ENCOUNTER — Encounter
Payer: BLUE CROSS/BLUE SHIELD | Attending: Physical Medicine and Rehabilitation | Admitting: Physical Medicine and Rehabilitation

## 2020-05-09 ENCOUNTER — Encounter: Payer: Self-pay | Admitting: Physical Medicine and Rehabilitation

## 2020-05-09 ENCOUNTER — Other Ambulatory Visit: Payer: Self-pay

## 2020-05-09 VITALS — BP 127/84 | HR 93 | Temp 98.5°F | Ht 69.0 in | Wt 131.6 lb

## 2020-05-09 DIAGNOSIS — G5632 Lesion of radial nerve, left upper limb: Secondary | ICD-10-CM | POA: Diagnosis present

## 2020-05-09 DIAGNOSIS — M62838 Other muscle spasm: Secondary | ICD-10-CM | POA: Insufficient documentation

## 2020-05-09 DIAGNOSIS — G35 Multiple sclerosis: Secondary | ICD-10-CM | POA: Diagnosis present

## 2020-05-09 DIAGNOSIS — R490 Dysphonia: Secondary | ICD-10-CM | POA: Diagnosis present

## 2020-05-09 DIAGNOSIS — R471 Dysarthria and anarthria: Secondary | ICD-10-CM | POA: Insufficient documentation

## 2020-05-09 DIAGNOSIS — Z978 Presence of other specified devices: Secondary | ICD-10-CM | POA: Diagnosis present

## 2020-05-09 NOTE — Patient Instructions (Signed)
  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 Hoarseness AND dysarthria; not just 1 issue.     1. Suggest for PCP-  Dr Bernadette Hoit [phone] Address: [facility] at [address], [place], [place] [address]- if she cannot/accept new patients, then try other female doctors in [place], [place]  2.  Suggest thinking about a company that can refill ITB pump at home and I can oversee it.   3. Call Dr Roxy Cedar office- ask for exact dose of ITB pump and when exactly due to refill- and size of pump and Baclofen concentration.   4. F/U in 3 months Double appointment

## 2020-05-09 NOTE — Progress Notes (Signed)
Subjective:    Patient ID: Sharon Odom, female    DOB: 10-27-1970, 49 y.o.   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 Hoarseness AND dysarthria; not just 1 issue.   Still in search for a new PCP.  Current PCP they comes out to the house.   Doesn't feel like they are helping her a lot.   Has a new lump over L breast- 2-3 inches down from armpit- they got diagnostic mammogram ordered for Thursday.   Dr Maple Hudson is doing the ITB pump- stopped Prialt on 03/28/20-  still feeling  Withdrawal from no Prialt.  Fee Gravity is pulling her down in legs- thinks due to lack of Prialt. And L arm is burning as well as gravity Sx's in LUE- feels heavy and weak.   Now has L wrist drop from 9 weeks ago.  From L radial neuropathy- per Dr Epimenio Foot, it's peripheral, so has a good chance of recovery.   Prialt withdrawal for 3-7 days and up to 2 weeks- on day 42.   On Lemtrada for secondary progressive MS   Bene doing therapy- OT- not getting much back.  Not getting results, so come back when does.  Almost done with PT- to be done by xmas.   Dr Maple Hudson took Prialt out of pump- could discuss possible numbing agent.  Pt not interested in any other medicine.      Pain Inventory Average Pain 4 Pain Right Now 2 My pain is burning and heavy  LOCATION OF PAIN  Forearm, bicep  BOWEL Number of stools per week: 4-5 Oral laxative use Yes  Type of laxative sennokot Enema or suppository use No  History of colostomy No  Incontinent No   BLADDER Normal In and out cath, frequency na Able to self cath na Bladder incontinence Yes  Frequent urination No  Leakage with coughing No  Difficulty starting stream No  Incomplete bladder emptying No    Mobility use a walker how many minutes can you walk? 5 ability to climb steps?  yes do you drive?  no use a wheelchair  Function disabled: date disabled . I need  assistance with the following:  household duties and shopping  Neuro/Psych bladder control problems bowel control problems weakness numbness trouble walking dizziness depression anxiety  Prior Studies Any changes since last visit?  no  Physicians involved in your care Any changes since last visit?  no   Family History  Problem Relation Age of Onset  . Diabetes Mother   . Healthy Brother    Social History   Socioeconomic History  . Marital status: Married    Spouse name: Not on file  . Number of children: Not on file  . Years of education: Not on file  . Highest education level: Not on file  Occupational History  . Not on file  Tobacco Use  . Smoking status: Current Every Day Smoker    Packs/day: 0.50    Years: 32.00    Pack years: 16.00    Types: Cigarettes    Start date: 27  . Smokeless tobacco: Never Used  Vaping Use  . Vaping Use: Former  Substance and Sexual Activity  . Alcohol use: Yes    Comment: 2-3 times per wk  . Drug use: Never  . Sexual activity: Not Currently  Other Topics Concern  . Not on file  Social History  Narrative   Right handed    Caffeine: 1 cup or less per day- coffee   [employer]   Social Determinants of Health   Financial Resource Strain: Not on file  Food Insecurity: Not on file  Transportation Needs: Not on file  Physical Activity: Not on file  Stress: Not on file  Social Connections: Not on file   Past Surgical History:  Procedure Laterality Date  . EXCISION MORTON'S NEUROMA Right   . fallopian tube removal    . INTRATHECAL PUMP IMPLANT Right 02/18/2019   Procedure: INTRATHECAL PUMP IMPLANT;  Surgeon: Odette Fraction, MD;  Location: [facility] OR;  Service: Neurosurgery;  Laterality: Right;  INTRATHECAL PUMP IMPLANT  . WISDOM TOOTH EXTRACTION     Past Medical History:  Diagnosis Date  . Allergies   . Anxiety   . Arthritis   . Asthma   . Constipation   . Depression   . GERD (gastroesophageal reflux disease)   . Headache    . History of hiatal hernia   . Left radial nerve palsy 03/15/2020  . Multiple sclerosis (HCC)   . Vision abnormalities   . Wears glasses    BP 127/84   Pulse 93   Temp 98.5 F (36.9 C)   Ht 5\' 9"  (1.753 m)   Wt 131 lb 9.6 oz (59.7 kg)   SpO2 98%   BMI 19.43 kg/m   Opioid Risk Score:   Fall Risk Score:  `1  Depression screen PHQ 2/9  Depression screen [facility] 2/9 02/08/2020 12/14/2019 09/07/2019 04/27/2019  Decreased Interest 1 0 1 1  Down, Depressed, Hopeless 1 0 1 1  PHQ - 2 Score 2 0 2 2  Altered sleeping - - - 1  Tired, decreased energy - - - 1  Change in appetite - - - 1  Feeling bad or failure about yourself  - - - 0  Trouble concentrating - - - 1  Moving slowly or fidgety/restless - - - 2  Suicidal thoughts - - - 0  PHQ-9 Score - - - 8  Difficult doing work/chores - - - Very difficult   Review of Systems An entire ROS was completed and found ot be negative except for HPI    Objective:   Physical Exam  Awake, alert, accompanied by husband, in manual w/c, frequently changing positions, NAS MS: LUE- cannot stick up L thumb well WE is 2-/5; grip is 3/5, maybe 3+/5, and finger abd is trace/5,  Hard to understand- dysarthria.      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 Hoarseness AND dysarthria; not just 1 issue.     1. Suggest for PCP-  Dr [phone] Address: [facility] at [address], [place], [place]- if she cannot/accept new patients, then try other female doctors in [place], [place]  2.  Suggest thinking about a company that can refill ITB pump at home and I can oversee it.   3. Call Dr [place] office- ask for exact dose of ITB pump and when exactly due to refill- and size of pump and Baclofen concentration.   4. F/U in 3 months Double appointment  I spent a total of 35 minutes on visit- as detailed above.

## 2020-05-31 ENCOUNTER — Telehealth: Payer: Self-pay | Admitting: Neurology

## 2020-05-31 NOTE — Telephone Encounter (Signed)
Patient has (3) 100 unit vials of Botox in the fridge. Patient has not had a Botox appointment since 11/25/19. I called the patient and LVM to discuss.

## 2020-06-09 ENCOUNTER — Encounter: Payer: Self-pay | Admitting: Neurology

## 2020-06-09 ENCOUNTER — Encounter: Payer: Self-pay | Admitting: *Deleted

## 2020-06-09 NOTE — Telephone Encounter (Signed)
It is okay to give her injection appointment, please give her appointment at her convenient time, do not dissolve toxin until I evaluate her first

## 2020-06-15 ENCOUNTER — Encounter: Payer: Self-pay | Admitting: Physical Medicine and Rehabilitation

## 2020-06-15 ENCOUNTER — Encounter
Payer: BLUE CROSS/BLUE SHIELD | Attending: Physical Medicine and Rehabilitation | Admitting: Physical Medicine and Rehabilitation

## 2020-06-15 ENCOUNTER — Other Ambulatory Visit: Payer: Self-pay

## 2020-06-15 DIAGNOSIS — Z978 Presence of other specified devices: Secondary | ICD-10-CM | POA: Insufficient documentation

## 2020-06-15 DIAGNOSIS — G35 Multiple sclerosis: Secondary | ICD-10-CM | POA: Insufficient documentation

## 2020-06-15 NOTE — Progress Notes (Signed)
Interrogated pt ITB pump  Pt's pump is 869.8 mcg/day  The concentration is 2250 mcg/ml And HAS to be refilled by 07/04/2020- want to refill obviously before that.   Spoke to [employer]- they can do 2250 mcg/ml concentration, but medicine stability is less than prefilled vials- and will make refills q41 days not 45 days.   Will try to move to 41 days to be refilled, since medicine stability is better and less chance for error.   Has [employer]

## 2020-06-22 ENCOUNTER — Telehealth: Payer: Self-pay

## 2020-06-22 NOTE — Telephone Encounter (Signed)
Did the new rx per [facility] requirements- have had Renae fax it back to [phone]- per Sandra's request at [facility].

## 2020-06-22 NOTE — Telephone Encounter (Signed)
Dois Davenport, RN from [employer] called stating the volume needs to be added to the script for Baclofen pump. Needs ASAP, alarm date on pump is 2/7.

## 2020-07-12 ENCOUNTER — Other Ambulatory Visit: Payer: Self-pay

## 2020-07-12 ENCOUNTER — Telehealth: Payer: Self-pay | Admitting: Neurology

## 2020-07-12 ENCOUNTER — Ambulatory Visit (INDEPENDENT_AMBULATORY_CARE_PROVIDER_SITE_OTHER): Payer: BLUE CROSS/BLUE SHIELD | Admitting: Neurology

## 2020-07-12 VITALS — BP 113/73 | HR 76 | Ht 69.0 in

## 2020-07-12 DIAGNOSIS — G35 Multiple sclerosis: Secondary | ICD-10-CM

## 2020-07-12 DIAGNOSIS — R29898 Other symptoms and signs involving the musculoskeletal system: Secondary | ICD-10-CM | POA: Diagnosis not present

## 2020-07-12 NOTE — Telephone Encounter (Signed)
I evaluate the patient today, decided not to give her an injection, she has  loose weak left upper lower extremity muscles, there was no significant  muscle spasticity, despite patient complains of spasm   Patient stated that she has 300 units of paid Botox in office storage, please verify, and we will discuss a plan

## 2020-07-12 NOTE — Progress Notes (Signed)
PATIENT: Sharon BrooksChristy Odom DOB: 1970-08-23  Chief Complaint  Patient presents with   Botulinum Toxin Injection    RM 16. Botox 100Ux3vials. Lot [id number], exp: 10/23, NDC: 2878-6767-20: 0023-1145-01     HISTORICAL   Sharon Odom is a 50 years old female, accompanied by her husband, referred by Dr. Epimenio FootSater for evaluation of botulism toxin injection for spasticity due to multiple sclerosis, this is the first time I evaluated her, also performed injection today on April 18, 2018.  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  She received Lemtrada infusion in April 2017, second dose April 2018, due to continued mild progression of her symptoms, she complete her third Lemtrada infusion in October 2019.  She has been complaining of bilateral lower extremity, left upper extremity spasticity since late 2017, gradually getting worse, has tried different medications, including baclofen up to 120 mg daily, with limited benefit, she is not taking baclofen 80 mg, previously also tried and failed tizanidine, Trileptal, lower dose of botulism toxin a injection in the past only mild temporarily improvement.  She did not ambulate with walker at home, wheelchair for longer distance, complains painful frequent left lower extremity spasm, forceful left lower extremity extension, when she bear weight, she has left toes forceful painful flexion grabbing on the floor, also left shoulder, arm spasm, difficulty release left hand grip.  She was recently started on  Valium 5 mg every 6 hours, which has been helpful.  I personally reviewed the MRI of the brain and cervical spine in September 2019, the MRI of the brain shows multiple T2/FLAIR hyperintense foci in brainstem, cerebellum, left thalamus, hemisphere, consistent with typical chronic demyelinating plaque, no enhancement. MRI of cervical spine showed small T2/flair hyperintensity foci posterior to the left C3-4, several 45 also noted within the brainstem, multilevel mild degenerative changes.  No significant canal stenosis.   She also complains of urinary urgency, frequency, is taking Myrbetriq 50 mg, Wellbutrin 300 mg for depression, mild swallowing difficulty, swallowing evaluation is pending  UPDATE August 06 2018: She had first injection on April 18, 2018, we used Botox a 600 units, 400 units into spastic left lower extremity, 200 into left upper extremity,  Within 3 days, she notice weakness of left 1, 2 fingers, lasted for few weeks, she has difficulty using her left hand, but her left bicep pains has much improved,it also helped her left calf muscle spams.  She noticed increased left leg and shoulder muscle spasm since Jan 10.  UPDATE November 05 2018: She responded well to previous injection, reported 80% improvement of her left thigh muscle spasm, 50% improvement of her left shoulder, proximal arm muscle spasm, but the benefit of short lasting, she continue have bilateral toe flexion, painful, left worse than right, there was no improvement in her left calf muscle spasm, and pain.  UPDATE Sept 14 2020: She responded well to previous injection to left shoulder, but recently she complains of worsening left shoulder pain, is planning on to have evaluation and x-ray by her primary care physician, she denies significant improvement  to the spasticity of her left lower extremity, was recently evaluated by Dr. Ollen Bowl, planning on to have baclofen placement, per patient, low-dose baclofen intrathecal  trial produced miracles,  UPDATE May 14 2019: She reported significant improvement with botulism toxin injection to her left shoulder, mild improvement to spasticity left lower extremity  UPDATE August 17 2019: She responded well to previous injection, left shoulder, left calf muscle spasm has much improved, she received baclofen pump by Dr. Ollen Bowl in September 2020, is going through dose adjustment,  Update November 25, 2019: Baclofen pump that was placed by Dr. Ollen Bowl in September 2020 has been helpful, especially for her lower extremity, today she just want Botox injection for her spastic left upper extremity, complains of left shoulder pain, left hand finger flexion  UPDATE Jul 12 2020: I personally reviewed MRI of the brain with without contrast in November 2021: Multiple T2/FLAIR hyperintensity foci in the brainstem, cerebellum, thalamus, hemisphere, consistent with chronic demyelinating plaque, no new lesions, no contrast-enhancement MRI of cervical spine with without contrast, hazy upper cervical cord, multiple T2 hyperintensity foci within the spinal cord at cervical medullary junction, C2-3, C3-4, C4 and C6, no contrast-enhancement, no change compared to previous scan November 2020  Also reviewed previous evaluation by Dr. Epimenio Foot, [facility] admission for delusion and hallucinations, baclofen, has really helped her spasticity, occasionally tonic spasms can be severe, more frequent tonic spasming feet, mildly increased tone in legs, difficulty lifting left leg,  She woke up October 6 noticed left wrist drop, left arm pain, and hand numbness, at baseline her left arm is weaker than right,  This has led to EMG nerve conduction study on April 19, 2020, showed normal left radial, median, ulnar sensory response.  But there is evidence of spontaneous activity at left extensor digitorum communis, extensor index proprius, wrist the possibility of left radial neuropathy,  She is here  to evaluate potential Botox injection for her reported left shoulder tightness,  REVIEW OF SYSTEMS: Full 14 system review of systems performed and notable only for as above All other review of systems were negative.  ALLERGIES: Allergies  Allergen Reactions   Morphine Other (See Comments)    HOME MEDICATIONS: Current Outpatient Medications  Medication Sig Dispense Refill   Acetaminophen (TYLENOL EXTRA STRENGTH PO) Take 1,000-1,300 mg by mouth every 6 (six) hours as needed.      albuterol (VENTOLIN HFA) 108 (90 Base) MCG/ACT inhaler Inhale 1-2 puffs into the lungs every 6 (six) hours as needed for wheezing or shortness of breath. 6.7 g 1   Alemtuzumab (LEMTRADA) 12 MG/1.2ML SOLN Inject 12 mg into the vein See admin instructions. Once a year     baclofen (LIORESAL) 20 MG tablet Take up to 6 pills daily for MS spasticity 540 each 3   bisacodyl (DULCOLAX) 10 MG suppository Place 1 suppository (10 mg total) rectally daily as needed for moderate constipation. 12 suppository 0   BOTOX 100 units SOLR injection INJECT UP TO 600 UNITS INTO MUSCLES OF LOWER EXTREMITIES EVERY THREE MONTHS. DISCARD UNUSED PORTION. 600 Units 0   buPROPion (WELLBUTRIN XL) 300 MG 24 hr tablet TAKE 1 TABLET DAILY 90 tablet 3   busPIRone (BUSPAR) 15 MG tablet Take 1 tablet (15 mg total) by mouth 2 (two) times daily. 60 tablet 5   Cholecalciferol (VITAMIN D-3) 125 MCG (5000 UT) TABS Take 5,000 Units by mouth daily. 30 tablet 1   Cyanocobalamin (VITAMIN B-12) 2500 MCG SUBL Place 1 tablet (2,500 mcg total) under the tongue  daily. 30 tablet 0   dalfampridine 10 MG TB12 Take 1 tablet (10 mg total) by mouth 2 (two) times daily. One po bid 180 tablet 3   furosemide (LASIX) 20 MG tablet Take 1 tablet (20 mg total) by mouth every morning. 30 tablet 5   ibuprofen (ADVIL) 200 MG tablet Take 900 mg by mouth every 6 (six) hours as needed.      montelukast (SINGULAIR) 10 MG tablet TAKE 1 TABLET DAILY 90 tablet 3    naproxen sodium (ALEVE) 220 MG tablet Take 220 mg by mouth 2 (two) times daily as needed.      ondansetron (ZOFRAN) 4 MG tablet Take 1 tablet (4 mg total) by mouth every 4 (four) hours as needed for nausea or vomiting. 180 tablet 1   oxybutynin (DITROPAN-XL) 10 MG 24 hr tablet Take 1 tablet (10 mg total) by mouth daily. 30 tablet 1   Oxycodone HCl 10 MG TABS Take 10 mg by mouth every 4 (four) hours as needed.     pregabalin (LYRICA) 75 MG capsule Take 1 capsule (75 mg total) by mouth 2 (two) times daily. 60 capsule 5   ranitidine (ZANTAC) 300 MG tablet Take 1 tablet (300 mg total) by mouth daily as needed for heartburn. 30 tablet 0   senna-docusate (SENOKOT-S) 8.6-50 MG tablet Take 1 tablet by mouth daily. (Patient taking differently: Take 2 tablets by mouth daily. ) 30 tablet 0   sulfamethoxazole-trimethoprim (BACTRIM DS) 800-160 MG tablet Take 1 tablet by mouth twice daily for seven days 14 tablet 0   Ziconotide Acetate (PRIALT IT) by Intrathecal route. 1.677 mcg/day     No current facility-administered medications for this visit.    PAST MEDICAL HISTORY: Past Medical History:  Diagnosis Date   Allergies    Anxiety    Arthritis    Asthma    Constipation    Depression    GERD (gastroesophageal reflux disease)    Headache    History of hiatal hernia    Left radial nerve palsy 03/15/2020   Multiple sclerosis (HCC)    Vision abnormalities    Wears glasses     PAST SURGICAL HISTORY: Past Surgical History:  Procedure Laterality Date   EXCISION MORTON'S NEUROMA Right    fallopian tube removal     INTRATHECAL PUMP IMPLANT Right 02/18/2019   Procedure: INTRATHECAL PUMP IMPLANT;  Surgeon: Odette Fraction, MD;  Location: [facility];  Service: Neurosurgery;  Laterality: Right;  INTRATHECAL PUMP IMPLANT   WISDOM TOOTH EXTRACTION      FAMILY HISTORY: Family History  Problem Relation Age of Onset   Diabetes Mother    Healthy Brother     SOCIAL HISTORY: Social  History   Socioeconomic History   Marital status: Married    Spouse name: Not on file   Number of children: Not on file   Years of education: Not on file   Highest education level: Not on file  Occupational History   Not on file  Tobacco Use   Smoking status: Current Every Day Smoker    Packs/day: 0.50    Years: 32.00    Pack years: 16.00    Types: Cigarettes    Start date: 29   Smokeless tobacco: Never Used  Building services engineer Use: Former  Substance and Sexual Activity   Alcohol use: Yes    Comment: 2-3 times per wk   Drug use: Never   Sexual activity: Not Currently  Other Topics Concern   Not  on file  Social History Narrative   Right handed    Caffeine: 1 cup or less per day- coffee   [employer]   Social Determinants of Health   Financial Resource Strain: Not on file  Food Insecurity: Not on file  Transportation Needs: Not on file  Physical Activity: Not on file  Stress: Not on file  Social Connections: Not on file  Intimate Partner Violence: Not on file     PHYSICAL EXAM   Vitals:   07/12/20 0840  Height: 5\' 9"  (1.753 m)   Not recorded     Body mass index is 19.43 kg/m.    PHYSICAL EXAMNIATION:  Gen: NAD, conversant, well nourised, well groomed                     Cardiovascular: Regular rate rhythm, no peripheral edema, warm, nontender. Eyes: Conjunctivae clear without exudates or hemorrhage Neck: Supple, no carotid bruits. Pulmonary: Clear to auscultation bilaterally   NEUROLOGICAL EXAM:  MENTAL STATUS: Speech/Cognition: Awake, alert, normal speech, oriented to history taking and casual conversation.  CRANIAL NERVES: CN II: Visual fields are full to confrontation.  Pupils are round equal and briskly reactive to light. CN III, IV, VI: extraocular movement are normal. No ptosis. CN V: Facial sensation is intact to light touch. CN VII: Face is symmetric with normal eye closure and smile. CN VIII: Hearing is normal to casual  conversation CN IX, X: Palate elevates symmetrically. Phonation is normal. CN XI: Head turning and shoulder shrug are intact CN XII: Tongue is midline with normal movements and no atrophy.  MOTOR: Flaccid left upper extremity, no significant spasticity, right upper extremity motor strength is relatively preserved, she is able to raise left arm against gravity, mild weakness of left forearm extensor surface, moderate grip weakness, able to raise left wrist/finger against gravity,  No antigravity movement of left lower extremity,  REFLEXES: Reflexes are 2  and symmetric at the biceps, triceps, knees and absent at ankles. Plantar responses are flexor.   COORDINATION: There is no trunk or limb ataxia.    GAIT/STANCE: Deferred    DIAGNOSTIC DATA (LABS, IMAGING, TESTING) - I reviewed patient records, labs, notes, testing and imaging myself where available.   ASSESSMENT AND PLAN  Dariann Desai is a 50 y.o. female    Relapsing Remitting Multiple Sclerosis, left spasticity, lower extremity more than left upper extremity. Status post baclofen pump placement by Dr. Norville Haggard in September 2020, which has helped her lower extremity spasticity,   She has received periodic EMG guided Botox 80 injection for spastic left upper extremity, We used BOTOX A 300 units today for spastic left upper extremity, most recent injection was in June 2021  She reported subjective spasticity tightness around her left shoulder, proximal arm, but there was no significant spasticity noted at all, actually she was quite flaccid in her left upper extremity, continue to show evidence of left upper extremity weakness, involving distal more than proximal muscles,  In addition, she was seen by Dr. Epimenio Foot in November 2021 for acute worsening of left wrist finger extension, evidence of denervation in left radial innervated muscles,  With her weakness, lack of spasticity on examinations, I do not think she is a good candidate  for continued Botox injection,   Her lower extremity spasticity has been helped by baclofen pump in September 2020      Levert Feinstein, M.D. Ph.D.  [facility] [address] [place], [place] [address] Ph: [phone])  [phone] Fax: [phone]  CC: Referring Provider

## 2020-07-12 NOTE — Telephone Encounter (Signed)
Patient has 3 vials of Botox 100 here from her specialty pharmacy. Please let me know what I can do to assist.

## 2020-07-15 DIAGNOSIS — R29898 Other symptoms and signs involving the musculoskeletal system: Secondary | ICD-10-CM | POA: Insufficient documentation

## 2020-07-18 ENCOUNTER — Ambulatory Visit: Payer: BLUE CROSS/BLUE SHIELD | Admitting: Registered Nurse

## 2020-07-19 NOTE — Telephone Encounter (Signed)
I called the patient and let her know that we will keep the 300 units of Botox here in case it is ever needed for her. Patient verbalized understanding & appreciation.

## 2020-08-08 ENCOUNTER — Encounter
Payer: BLUE CROSS/BLUE SHIELD | Attending: Physical Medicine and Rehabilitation | Admitting: Physical Medicine and Rehabilitation

## 2020-08-08 ENCOUNTER — Encounter: Payer: Self-pay | Admitting: Physical Medicine and Rehabilitation

## 2020-08-08 ENCOUNTER — Other Ambulatory Visit: Payer: Self-pay

## 2020-08-08 VITALS — BP 139/76 | HR 97 | Temp 99.0°F | Ht 69.0 in

## 2020-08-08 DIAGNOSIS — G35 Multiple sclerosis: Secondary | ICD-10-CM | POA: Insufficient documentation

## 2020-08-08 DIAGNOSIS — M62838 Other muscle spasm: Secondary | ICD-10-CM | POA: Diagnosis present

## 2020-08-08 DIAGNOSIS — Z5181 Encounter for therapeutic drug level monitoring: Secondary | ICD-10-CM | POA: Insufficient documentation

## 2020-08-08 DIAGNOSIS — R269 Unspecified abnormalities of gait and mobility: Secondary | ICD-10-CM | POA: Diagnosis present

## 2020-08-08 DIAGNOSIS — Z79899 Other long term (current) drug therapy: Secondary | ICD-10-CM | POA: Insufficient documentation

## 2020-08-08 DIAGNOSIS — G894 Chronic pain syndrome: Secondary | ICD-10-CM | POA: Insufficient documentation

## 2020-08-08 MED ORDER — OXYCODONE HCL 10 MG PO TABS: 10.0000 mg | ORAL_TABLET | ORAL | 0 refills | Status: DC | PRN

## 2020-08-08 MED ORDER — TIZANIDINE HCL 4 MG PO TABS: 4.0000 mg | ORAL_TABLET | Freq: Four times a day (QID) | ORAL | 5 refills | Status: DC | PRN

## 2020-08-08 NOTE — Progress Notes (Signed)
Subjective:    Patient ID: Sharon Odom, female    DOB: 03-23-1971, 50 y.o.   MRN: [id number]  HPI   Patient is a 50 yr old female with secondary progressive MS with L>R spasticity s/p ITB pump Here for f/u. Hoarseness AND dysarthria; not just 1 issue.  Got pump refilled 06/29/20- due for pump refill by 09/24/20- coming 09/19/20 to refill pump.   Got to hear what alarms sound like-   Needs refill of pain meds  Leg pain finally better off Priault- still having side effects in L leg and l arm- they are lingering side effects- better, but still there- is very certain it's from Priault, not MS.   Went to get botox- wouldn't do because the Neurologist thought she was too loose.   L 2nd toe- spasming and curls underneath -gets caught on things- when walks- wouldn't do either.   Not as severe, but hopes it doesn't get it worse- has already bought 3 vials of Botox.   Still has tried to find PCP- no MD's are taking new patients.   Has sleep issues- cannot fall/stay asleep-   Taking Baclofen 20 mg- 4-6 tabs/day-  Tried Zanaflex- in past- only concentrated on legs with that medicine, not arms   Has bladder urgency- or will pee her pants.  Doesn't think MS has anything to do with it- drinks a lot of water.     Pain Inventory Average Pain 4 Pain Right Now 2 My pain is constant, sharp, burning, tingling, aching and stiff  LOCATION OF PAIN  Left shoulder, left hand, left arm BOWEL Number of stools per week: 5-6 Oral laxative use Yes  Type of laxative Senokot Enema or suppository use No  History of colostomy No  Incontinent When not able to get there quickly.  BLADDER Normal and Pads In and out cath, frequency No Able to self cath No  Bladder incontinence Yes  Frequent urination Yes  Leakage with coughing No  Difficulty starting stream No  Incomplete bladder emptying No    Mobility walk with assistance use a cane use a walker how many minutes can you walk? 5  min ability to climb steps?  yes do you drive?  no use a wheelchair transfers alone Do you have any goals in this area?  yes  Function disabled: date disabled 2013 I need assistance with the following:  meal prep, household duties and shopping Do you have any goals in this area?  yes  Neuro/Psych bowel control problems weakness numbness trouble walking spasms dizziness depression anxiety  Prior Studies Any changes since last visit?  no  Physicians involved in your care Any changes since last visit?  no   Family History  Problem Relation Age of Onset  . Diabetes Mother   . Healthy Brother    Social History   Socioeconomic History  . Marital status: Married    Spouse name: Not on file  . Number of children: Not on file  . Years of education: Not on file  . Highest education level: Not on file  Occupational History  . Not on file  Tobacco Use  . Smoking status: Current Every Day Smoker    Packs/day: 0.50    Years: 32.00    Pack years: 16.00    Types: Cigarettes    Start date: 89  . Smokeless tobacco: Never Used  Vaping Use  . Vaping Use: Former  Substance and Sexual Activity  . Alcohol use: Yes    Comment:  2-3 times per wk  . Drug use: Never  . Sexual activity: Not Currently  Other Topics Concern  . Not on file  Social History Narrative   Right handed    Caffeine: 1 cup or less per day- coffee   [employer]   Social Determinants of Health   Financial Resource Strain: Not on file  Food Insecurity: Not on file  Transportation Needs: Not on file  Physical Activity: Not on file  Stress: Not on file  Social Connections: Not on file   Past Surgical History:  Procedure Laterality Date  . EXCISION MORTON'S NEUROMA Right   . fallopian tube removal    . INTRATHECAL PUMP IMPLANT Right 02/18/2019   Procedure: INTRATHECAL PUMP IMPLANT;  Surgeon: Odette Fraction, MD;  Location: [facility] OR;  Service: Neurosurgery;  Laterality: Right;  INTRATHECAL PUMP IMPLANT  .  WISDOM TOOTH EXTRACTION     Past Medical History:  Diagnosis Date  . Allergies   . Anxiety   . Arthritis   . Asthma   . Constipation   . Depression   . GERD (gastroesophageal reflux disease)   . Headache   . History of hiatal hernia   . Left radial nerve palsy 03/15/2020  . Multiple sclerosis (HCC)   . Vision abnormalities   . Wears glasses    BP 139/76   Pulse 97   Temp 99 F (37.2 C)   SpO2 98%   Opioid Risk Score:   Fall Risk Score:  `1  Depression screen PHQ 2/9  Depression screen [facility] 2/9 02/08/2020 12/14/2019 09/07/2019 04/27/2019  Decreased Interest 1 0 1 1  Down, Depressed, Hopeless 1 0 1 1  PHQ - 2 Score 2 0 2 2  Altered sleeping - - - 1  Tired, decreased energy - - - 1  Change in appetite - - - 1  Feeling bad or failure about yourself  - - - 0  Trouble concentrating - - - 1  Moving slowly or fidgety/restless - - - 2  Suicidal thoughts - - - 0  PHQ-9 Score - - - 8  Difficult doing work/chores - - - Very difficult   Review of Systems  Musculoskeletal: Positive for gait problem.       Pain in the left shoulder, left hand,  fingers & left arm.  All other systems reviewed and are negative.      Objective:   Physical Exam  Awake, alert, hoarse voice, accompanied by husband, in manual w/c, NAD LUE- MAS of 1+ in L shoulder and 1+ in L elbow- loose in wrist and fingers RUE- MAS slightly catch in R elbow, but not shoulder or wrist/or fingers.         Assessment & Plan:   Patient is a 50 yr old female with secondary progressive MS with L>R spasticity s/p ITB pump Here for f/u. Hoarseness AND dysarthria; not just 1 issue.Insomnia and ITB refills   1. Will refill Oxycodone 10 mg q4 hours as needed #120 pills-   2. Can take tylenol pm- or melatonin 3-10 mg nightly- don't take more than 10 mg nightly-  Is over the counter-   3. Is NOT allergic to Lyrica  4.  Retry Zanaflex- can take as little as 2 mg 2x/day- but will write Rx for 4 mg q6 hours as  needed- experiment what dose works for you.   5. F/U in 2 months- see how she's doing.   6. Might need oral screen drug when check next.  I spent a total of 28 minutes on visit- as detailed above.

## 2020-08-08 NOTE — Patient Instructions (Signed)
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 Hoarseness AND dysarthria; not just 1 issue.Insomnia and ITB refills   1. Will refill Oxycodone 10 mg q4 hours as needed #120 pills-   2. Can take tylenol pm- or melatonin 3-10 mg nightly- don't take more than 10 mg nightly-  Is over the counter-   3. Is NOT allergic to Lyrica  4.  Retry Zanaflex- can take as little as 2 mg 2x/day- but will write Rx for 4 mg q6 hours as needed- experiment what dose works for you.   5. F/U in 2 months- see how she's doing.   6. Might need oral screen drug when check next.

## 2020-08-16 LAB — TOXASSURE SELECT,+ANTIDEPR,UR

## 2020-08-18 ENCOUNTER — Telehealth: Payer: Self-pay | Admitting: *Deleted

## 2020-08-18 NOTE — Telephone Encounter (Signed)
Urine drug screen for this encounter is consistent for prescribed medication 

## 2020-09-07 ENCOUNTER — Other Ambulatory Visit: Payer: Self-pay | Admitting: *Deleted

## 2020-09-07 MED ORDER — DALFAMPRIDINE ER 10 MG PO TB12
10.0000 mg | ORAL_TABLET | Freq: Two times a day (BID) | ORAL | 1 refills | Status: DC
Start: 2020-09-07 — End: 2020-09-08

## 2020-09-07 NOTE — Progress Notes (Signed)
Called Sharon Odom and spoke with [place]. She confirmed pt has been filling dalfampridine w/ then. Most recent fill was on 07/18/20 written by Dr. Epimenio Foot. She has not been using goodrx coupon, they fill w/ discount coupon. Cost is about 123.67 for each fill. I e-scribed refill.

## 2020-09-08 ENCOUNTER — Other Ambulatory Visit: Payer: Self-pay | Admitting: *Deleted

## 2020-09-08 DIAGNOSIS — R269 Unspecified abnormalities of gait and mobility: Secondary | ICD-10-CM

## 2020-09-08 DIAGNOSIS — G35 Multiple sclerosis: Secondary | ICD-10-CM

## 2020-09-08 MED ORDER — DALFAMPRIDINE ER 10 MG PO TB12: 10.0000 mg | ORAL_TABLET | Freq: Two times a day (BID) | ORAL | 1 refills | Status: DC

## 2020-09-13 ENCOUNTER — Ambulatory Visit (INDEPENDENT_AMBULATORY_CARE_PROVIDER_SITE_OTHER): Payer: BLUE CROSS/BLUE SHIELD | Admitting: Neurology

## 2020-09-13 ENCOUNTER — Encounter: Payer: Self-pay | Admitting: Neurology

## 2020-09-13 VITALS — BP 127/79 | HR 72 | Ht 69.0 in

## 2020-09-13 DIAGNOSIS — G35 Multiple sclerosis: Secondary | ICD-10-CM

## 2020-09-13 DIAGNOSIS — G8220 Paraplegia, unspecified: Secondary | ICD-10-CM | POA: Diagnosis not present

## 2020-09-13 DIAGNOSIS — R208 Other disturbances of skin sensation: Secondary | ICD-10-CM

## 2020-09-13 DIAGNOSIS — R269 Unspecified abnormalities of gait and mobility: Secondary | ICD-10-CM | POA: Diagnosis not present

## 2020-09-13 DIAGNOSIS — Z79899 Other long term (current) drug therapy: Secondary | ICD-10-CM

## 2020-09-13 NOTE — Progress Notes (Signed)
[facility]  PATIENT: Sharon Odom DOB: [birth year]  REFERRING DOCTOR OR PCP:  Claretta Fraise SOURCE: Patient, notes imaging and lab reports, MRI images on CD from neurology,  _________________________________   HISTORICAL   CHIEF COMPLAINT:  Chief Complaint  Patient presents with  . Follow-up    RM 12 with spouse. Last seen 03/15/2020. Last botox 07/12/20, Dr. Terrace Arabia advised she was not a good candidate moving forward for continued botox. She wants to discuss this, she was not happy with this. On Lemtrada for MS. In wheelchair today.    HISTORY OF PRESENT ILLNESS: Sharon Odom is a 50 y.o. woman with active/relapsing secondary progressive multiple sclerosis, left greater than right spasticity and poor gait.  Update 09/13/2020: She has been on [place].   No recent exacerbation.  She is on baclofen 6 x 20 mg baclofen daily and tizanidine 4 mg po qid.   She is on baclofen pump and gets refills in home health.   She reports spasticity is doing worse again.    She reports the Botox helped some.   She last saw Dr. Terrace Arabia in 07/12/2020 and reports the Botox 300 U was not done as she was not spastic in the arm at the time.  Last injection was June 2021.     She has spasticity in her feet but baclofen pump has overall helped.   She reports home health mildly reduced the baclofen pump dose.   Current setting is 869.8 mcg/day  Baclofen has helped the spasticity but she continues to have occasional tonic spasms that can be severe.  The more frequent tonic spasms in the feet have resolved.  She notes mildly increased tone in the legs.  She is able to take a few steps with a walker.  She has difficulty lifting the left leg.  She was walking better last year.  Her husband notes that her voice has become clear but harder to understand over the last month as well.  She woke up October 6 with a left wrist drop and left arm pain and hand numbness.   NCV/EMG confirmed a radial palsy.   She  spontaneously imrorved over another 8 weeks.    Her last [name] was January 2021 (fourth course). She is compliant with the rems program.  She has not had any evidence of ITP or renal insufficiency.  She has occasional UTIs.   Labs 09/06/2020 were fine.      Impression: This NCV/EMG study shows the following: 1.   Severe subacute left radial neuropathy.  Voluntary motor units were noted in the EDC muscle. 2.   No evidence of significant radiculopathy.  MS history: She was diagnosed in October 2013 due to difficulty with her handwriting and right hand coordination.   A few weeks later she had gait ataxia and numbness and was referred to MS.   She had an MRI of the brain (was in [place] at the time) and then had a LP confirming the diagnosis.  Initially, she was placed on Copaxone but had severe frequent exacerbations and then went to Tecfidera due to breakthrough exacerbations.  She was referred to [facility] for Tysabri (Feb 2014 to November 2016).  She then moved to [facility] and saw Dr. Ilene Qua [facility] [phone]).   She was always JCV Ab titer positive.    She did well on it but stopped 03/2015 due to safety risk.  She had more spasticity early 2017 that improved with 5 days IV Solumedrol and then started  [name] April 2017.   The infusion went well.    She started having more spasms in her legs later in 2017 and had Botox injections into her feet (some benefit but only for 3-4 weeks).   She had a second course of Botox November 2017 without much benefit.     She had numbness and tingling in her left arm November 2017 and was given an ESI.   Medical MJ did not help.   She had her second course of Lemtrada in April 2018 but the steroid did not help her spasticity as it did the first year.    She saw Dr. Gaynell Face at [facility] a couple times in 2018 and also saw Dr. Aquilla Hacker at [facility]. I started to see her in 2019.  A third course of Lemtrada was infused in October 2019.  A 4th  course was infused January 2021.  Due to spasticity, she had Botox injections, initially with benefit. However, due to continued symptoms, a baclofen pump placed September 2020. She is followed at [facility] (Dr. Juanetta Gosling)  Imaging:  MRI of the brain 03/19/2017 shows multiple T2/FLAIR hyperintense foci in the cerebellum, pons, left greater than right thalamus and in the periventricular, juxtacortical and deep white matter of both hemispheres.      MRI of the brain 03/31/2019 was felt to be slightly progressed compared to her previous MRI in 2019.  However, by my interpretation the MRI is stable.  It shows T2/FLAIR hyperintense foci in the hemispheres, thalamus, pons and cerebellum consistent with MS  MRI of the cervical and thoracic spine 03/31/2019 showed a discrete lesion posteriorly to the left at C3-C4 and subtle patchy multifocal cord signal abnormalities in the cervical spine.  By my interpretation the MRI is stable compared to 2019.  The thoracic spinal cord was normal.  REVIEW OF SYSTEMS: Constitutional: No fevers, chills, sweats, or change in appetite.  She reports fatigue. Eyes: As above Ear, nose and throat: No hearing loss, ear pain, nasal congestion, sore throat Cardiovascular: No chest pain, palpitations Respiratory: No shortness of breath at rest or with exertion.   No wheezes GastrointestinaI: No nausea, vomiting, diarrhea, abdominal pain, fecal incontinence Genitourinary:As above  musculoskeletal:She has pain in her legs, left greater than right due to spasticity.  She notes mild back pain. Integumentary: No rash, pruritus, skin lesions Neurological: as above Psychiatric: As above Endocrine: No palpitations, diaphoresis, change in appetite, change in weigh or increased thirst Hematologic/Lymphatic: No anemia, purpura, petechiae. Allergic/Immunologic: No itchy/runny eyes, nasal congestion, recent allergic reactions, rashes  ALLERGIES: Allergies  Allergen  Reactions  . Morphine Other (See Comments)  . Ziconotide Acetate     HOME MEDICATIONS:  Current Outpatient Medications:  .  Acetaminophen (TYLENOL EXTRA STRENGTH PO), Take 1,000-1,300 mg by mouth every 6 (six) hours as needed. , Disp: , Rfl:  .  albuterol (VENTOLIN HFA) 108 (90 Base) MCG/ACT inhaler, Inhale 1-2 puffs into the lungs every 6 (six) hours as needed for wheezing or shortness of breath., Disp: 6.7 g, Rfl: 1 .  Alemtuzumab (LEMTRADA IV), 12 mg MONTHLY (route: intravenous), Disp: , Rfl:  .  Alemtuzumab (LEMTRADA) 12 MG/1.2ML SOLN, Inject 12 mg into the vein See admin instructions. Once a year, Disp: , Rfl:  .  Alemtuzumab (LEMTRADA) 12 MG/1.2ML SOLN, Inject into the vein., Disp: , Rfl:  .  baclofen (LIORESAL) 20 MG tablet, Take up to 6 pills daily for MS spasticity, Disp: 540 each, Rfl: 3 .  BOTOX 100 units SOLR injection,  INJECT UP TO 600 UNITS INTO MUSCLES OF LOWER EXTREMITIES EVERY THREE MONTHS. DISCARD UNUSED PORTION., Disp: 600 Units, Rfl: 0 .  buPROPion (WELLBUTRIN XL) 300 MG 24 hr tablet, TAKE 1 TABLET DAILY, Disp: 90 tablet, Rfl: 3 .  Cholecalciferol (VITAMIN D-3) 125 MCG (5000 UT) TABS, Take 5,000 Units by mouth daily., Disp: 30 tablet, Rfl: 1 .  Cyanocobalamin (VITAMIN B-12) 2500 MCG SUBL, Place 1 tablet (2,500 mcg total) under the tongue daily., Disp: 30 tablet, Rfl: 0 .  dalfampridine 10 MG TB12, Take 1 tablet (10 mg total) by mouth 2 (two) times daily. One po bid, Disp: 180 tablet, Rfl: 1 .  ibuprofen (ADVIL) 200 MG tablet, Take 400 mg by mouth as needed., Disp: , Rfl:  .  montelukast (SINGULAIR) 10 MG tablet, TAKE 1 TABLET DAILY, Disp: 90 tablet, Rfl: 3 .  naproxen sodium (ALEVE) 220 MG tablet, Take 440 mg by mouth as needed., Disp: , Rfl:  .  oxybutynin (DITROPAN-XL) 10 MG 24 hr tablet, Take 1 tablet (10 mg total) by mouth daily., Disp: 30 tablet, Rfl: 1 .  Oxycodone HCl 10 MG TABS, Take 1 tablet (10 mg total) by mouth every 4 (four) hours as needed., Disp: 120 tablet,  Rfl: 0 .  ranitidine (ZANTAC) 300 MG tablet, Take 1 tablet (300 mg total) by mouth daily as needed for heartburn., Disp: 30 tablet, Rfl: 0 .  senna-docusate (SENOKOT-S) 8.6-50 MG tablet, Take 1 tablet by mouth daily. (Patient taking differently: Take 3-4 tablets by mouth daily.), Disp: 30 tablet, Rfl: 0 .  tiZANidine (ZANAFLEX) 4 MG tablet, Take 1 tablet (4 mg total) by mouth every 6 (six) hours as needed for muscle spasms. (Patient taking differently: Take 6 mg by mouth every 6 (six) hours as needed for muscle spasms.), Disp: 120 tablet, Rfl: 5  PAST MEDICAL HISTORY: Past Medical History:  Diagnosis Date  . Allergies   . Anxiety   . Arthritis   . Asthma   . Constipation   . Depression   . GERD (gastroesophageal reflux disease)   . Headache   . History of hiatal hernia   . Left radial nerve palsy 03/15/2020  . Multiple sclerosis (HCC)   . Vision abnormalities   . Wears glasses     PAST SURGICAL HISTORY:   FAMILY HISTORY: Family History  Problem Relation Age of Onset  . Diabetes Mother   . Healthy Brother     SOCIAL HISTORY:  Social History   Socioeconomic History  . Marital status: Married    Spouse name: Not on file  . Number of children: Not on file  . Years of education: Not on file  . Highest education level: Not on file  Occupational History  . Not on file  Tobacco Use  . Smoking status: Current Every Day Smoker    Packs/day: 0.50    Years: 32.00    Pack years: 16.00    Types: Cigarettes    Start date: 20  . Smokeless tobacco: Never Used  Vaping Use  . Vaping Use: Former  Substance and Sexual Activity  . Alcohol use: Not Currently    Comment: 2-3 times per wk  . Drug use: Never  . Sexual activity: Not Currently  Other Topics Concern  . Not on file  Social History Narrative   Right handed    Caffeine: 1 cup or less per day- coffee   [employer]   Social Determinants of Health   Financial Resource Strain: Not on file  Food Insecurity: Not on  file  Transportation Needs: Not on file  Physical Activity: Not on file  Stress: Not on file  Social Connections: Not on file  Intimate Partner Violence: Not on file     PHYSICAL EXAM  Vitals:   09/13/20 1507  BP: 127/79  Pulse: 72  SpO2: 96%  Height: 5\' 9"  (1.753 m)    Body mass index is 19.43 kg/m.   General: The patient is well-developed and well-nourished and in no acute distress  Neurologic Exam  Mental status: The patient is alert and oriented x 3 at the time of the examination. The patient has apparent normal recent and remote memory, with an apparently normal attention span and concentration ability.   Speech is normal.  Cranial nerves: Bilateral INO's, right greater than left.  Facial strength and sensation is normal.  The tongue is midline, and the patient has symmetric elevation of the soft palate. No obvious hearing deficits are noted.  Motor:   Spasticity is fairly resolved.  There were no flexion spasms.   Little strength was 0/5 in the radial innervated left arm muscles and 4/5 in the left triceps and other muscles in the arm.  The right arm has normal muscle tone and it was slightly increased proximally in the left arm.  Strength is 2+ to 3/5 in the left leg and 4- to 4/5 in the right leg .     Sensory: Normal sensation to touch and vibration in the arms.  She has reduced sensation to vibration in the left leg.  Coordination: She has good right finger-nose-finger but reduced left finger-nose-finger.  HTS is poor on the right and she can't do on the left.    Gait and station: With support, she is able to stand and take a few steps.  She has difficulty lifting the left leg up.    Reflexes: Deep tendon reflexes are now 1+ at the knees.  No clonus at the ankles    ASSESSMENT AND PLAN     1. Multiple sclerosis (HCC)   2. Dysesthesia   3. Spastic diplegia, acquired, lower extremity (HCC)   4. Gait disturbance   5. High risk medication use      1.  She  has had 4 courses of Lemtrada for her MS.    She will need REMS labs until 2025 2.  Spasticity had done better with the baclofen pump.  She is currently on 870 mcg/day.  We could consider a mildly higher dose if foot spasms worsen.   3.   Continue Ampyra and other medications 4.   Follow-up in 6 months or sooner if there are new or worsening neurologic symptoms.   Royal Beirne A. 2026, MD, [facility] 09/13/2020, 11:01 PM Certified in Neurology, Clinical Neurophysiology, Sleep Medicine, Pain Medicine and Neuroimaging  [facility] [address], [address] [place], [place] [phone]

## 2020-10-06 ENCOUNTER — Encounter: Payer: Self-pay | Admitting: Neurology

## 2020-10-10 ENCOUNTER — Other Ambulatory Visit: Payer: Self-pay

## 2020-10-10 ENCOUNTER — Encounter: Payer: Self-pay | Admitting: Physical Medicine and Rehabilitation

## 2020-10-10 ENCOUNTER — Encounter
Payer: BLUE CROSS/BLUE SHIELD | Attending: Physical Medicine and Rehabilitation | Admitting: Physical Medicine and Rehabilitation

## 2020-10-10 VITALS — BP 123/85 | HR 83 | Temp 97.8°F

## 2020-10-10 DIAGNOSIS — R490 Dysphonia: Secondary | ICD-10-CM | POA: Diagnosis present

## 2020-10-10 DIAGNOSIS — Z978 Presence of other specified devices: Secondary | ICD-10-CM | POA: Diagnosis present

## 2020-10-10 DIAGNOSIS — M62838 Other muscle spasm: Secondary | ICD-10-CM | POA: Diagnosis present

## 2020-10-10 DIAGNOSIS — G35 Multiple sclerosis: Secondary | ICD-10-CM

## 2020-10-10 DIAGNOSIS — R269 Unspecified abnormalities of gait and mobility: Secondary | ICD-10-CM | POA: Diagnosis present

## 2020-10-10 MED ORDER — TIZANIDINE HCL 4 MG PO TABS: 8.0000 mg | ORAL_TABLET | Freq: Three times a day (TID) | ORAL | 3 refills | Status: DC

## 2020-10-10 MED ORDER — TIZANIDINE HCL 4 MG PO TABS: 8.0000 mg | ORAL_TABLET | Freq: Four times a day (QID) | ORAL | 3 refills | Status: DC

## 2020-10-10 NOTE — Patient Instructions (Signed)
Patient is a 50yr old female with secondary progressive MS with L>R spasticity s/p ITB pump Here for f/u. Hoarseness AND dysarthria; not just 1 issue.Insomnia and ITB refills  Here for f/u on MS and Spasticity/ITB pump    1. Will increase tizanidine/Zanaflex to 8 mg 3x/day- - since that's the max dose- 6 tabs/day max. 24 mg/day.   2. Will con't Current ITB pump dose and maybe titrate a little at next visit- will see how does with increase in Zanaflex dose she just changed.   3.  Discussed ITB pump- sometimes hard to put catheter up high to affect hands as well as LEs.   4. Con't Oral Baclofen with Zanaflex and Wellbutrin  5. Con't Oxycodone- doesn't need refill right now per pt.    6. F/U in 3 months To possibly titrate ITB pump and f/u for MS

## 2020-10-10 NOTE — Progress Notes (Signed)
Subjective:    Patient ID: Sharon Odom, female    DOB: [birth year], 50 y.o.   MRN: [id number]  HPI   Patient is a 50yr old female with secondary progressive MS with L>R spasticity s/p ITB pump Here for f/u. Hoarseness AND dysarthria; not just 1 issue.Insomnia and ITB refills  Here for f/u on MS and Spasticity/ITB pump   Refill by 12/15/20- the pump clock was 1 hr 3 minutes behind programmer clock- and was reset.  ITB 869.2 mcg/day Est replace 5/27.   Tried Zanaflex again- per my request.   can take 2 tabs- cannot exceed 6 tabs in 24 hours.  Takes 8 mg in AM; takes 1-2 tab sometimes in afternoon- at different times; 2 tabs at night. So usually takes 6 tabs/day.   Dr Epimenio Foot increased Zanaflex.  Was thinking could increase Baclofen.  Zanaflex has done nothing for arm and hand, but has helped/not resolved toe curling.   No side effects from Zanaflex.  No hallucinations.   Been 7 months and still having residual side effects from Prialt.   Fell 2x- last week- going too fast with RW.        Pain Inventory Average Pain 3 Pain Right Now 1 My pain is constant, burning, aching and stiff  LOCATION OF PAIN  Left shoulder, left hand, left arm  BOWEL Number of stools per week: 5-6 Oral laxative use Yes  Type of laxative sennakot Enema or suppository use No  History of colostomy No  Incontinent No   BLADDER Normal In and out cath, frequency Able to self cath No  Bladder incontinence No  Frequent urination Yes  Leakage with coughing No  Difficulty starting stream No  Incomplete bladder emptying No    Mobility walk with assistance use a cane use a walker how many minutes can you walk? 5 ability to climb steps?  yes do you drive?  no use a wheelchair transfers alone  Function disabled: date disabled 2013 I need assistance with the following:  meal prep, household duties and shopping  Neuro/Psych weakness numbness tingling trouble  walking spasms dizziness depression  Prior Studies Any changes since last visit?  no  Physicians involved in your care Any changes since last visit?  no   Family History  Problem Relation Age of Onset  . Diabetes Mother   . Healthy Brother    Social History   Socioeconomic History  . Marital status: Married    Spouse name: Not on file  . Number of children: Not on file  . Years of education: Not on file  . Highest education level: Not on file  Occupational History  . Not on file  Tobacco Use  . Smoking status: Current Every Day Smoker    Packs/day: 0.50    Years: 32.00    Pack years: 16.00    Types: Cigarettes    Start date: 17  . Smokeless tobacco: Never Used  Vaping Use  . Vaping Use: Former  Substance and Sexual Activity  . Alcohol use: Not Currently    Comment: 2-3 times per wk  . Drug use: Never  . Sexual activity: Not Currently  Other Topics Concern  . Not on file  Social History Narrative   Right handed    Caffeine: 1 cup or less per day- coffee   [employer]   Social Determinants of Health   Financial Resource Strain: Not on file  Food Insecurity: Not on file  Transportation Needs: Not on file  Physical Activity: Not on file  Stress: Not on file  Social Connections: Not on file   Past Surgical History:  Procedure Laterality Date  . EXCISION MORTON'S NEUROMA Right   . fallopian tube removal    . INTRATHECAL PUMP IMPLANT Right 02/18/2019   Procedure: INTRATHECAL PUMP IMPLANT;  Surgeon: Odette Fraction, MD;  Location: [facility];  Service: Neurosurgery;  Laterality: Right;  INTRATHECAL PUMP IMPLANT  . WISDOM TOOTH EXTRACTION     Past Medical History:  Diagnosis Date  . Allergies   . Anxiety   . Arthritis   . Asthma   . Constipation   . Depression   . GERD (gastroesophageal reflux disease)   . Headache   . History of hiatal hernia   . Left radial nerve palsy 03/15/2020  . Multiple sclerosis (HCC)   . Vision abnormalities   . Wears glasses     There were no vitals taken for this visit.  Opioid Risk Score:   Fall Risk Score:  `1  Depression screen PHQ 2/9  Depression screen [facility] 2/9 08/08/2020 02/08/2020 12/14/2019 09/07/2019 04/27/2019  Decreased Interest 1 1 0 1 1  Down, Depressed, Hopeless 1 1 0 1 1  PHQ - 2 Score 2 2 0 2 2  Altered sleeping - - - - 1  Tired, decreased energy - - - - 1  Change in appetite - - - - 1  Feeling bad or failure about yourself  - - - - 0  Trouble concentrating - - - - 1  Moving slowly or fidgety/restless - - - - 2  Suicidal thoughts - - - - 0  PHQ-9 Score - - - - 8  Difficult doing work/chores - - - - Very difficult     Review of Systems  Gastrointestinal: Positive for vomiting.  Musculoskeletal: Positive for gait problem.       Left shoulder pain Left hand pain Fingers and left arm pain  Neurological: Positive for dizziness, weakness and numbness.  Psychiatric/Behavioral: Positive for dysphoric mood.  All other systems reviewed and are negative.      Objective:   Physical Exam Awake, alert, appropriate, in manual w/c, accompanied by husband, dysarthric and muted/hoarse voice, dressed great, NAD MS:  Neuro: No hoffmans B/L MAS of LLE- of 1+ to 2 in L knee and R knee Don't see static tone in LUE, but has some dynamically, when stood today of L hand clawing. Per pt occurs when using L hand.   Crouched gait with holding onto husband And L hand is posturing - clawing somewhat with gait 2nd toe on L is curling very slightly-   Trace foot edema up into ankle Mild coolness on LUE vs RUE- both cool, but L cooler.       Assessment & Plan:   Patient is a 50yr old female with secondary progressive MS with L>R spasticity s/p ITB pump Here for f/u. Hoarseness AND dysarthria; not just 1 issue.Insomnia and ITB refills  Here for f/u on MS and Spasticity/ITB pump    1. Will increase tizanidine/Zanaflex to 8 mg 3x/day- - since that's the max dose- 6 tabs/day max. 24 mg/day.   2.  Will con't Current ITB pump dose and maybe titrate a little at next visit- will see how does with increase in Zanaflex dose she just changed.   3.  Discussed ITB pump- sometimes hard to put catheter up high to affect hands as well as LEs.   4. Con't Oral Baclofen with Zanaflex and  Wellbutrin  5. Con't Oxycodone- doesn't need refill right now per pt.    6. F/U in 3 months- has MRI in October 2022- scheduled.  To possibly titrate ITB pump and f/u for MS  I spent a total of 25 minutes on visit- discussing ITB and spasticity overall.

## 2020-10-12 ENCOUNTER — Ambulatory Visit: Payer: Self-pay | Admitting: Neurology

## 2020-12-06 ENCOUNTER — Encounter: Payer: Self-pay | Admitting: Neurology

## 2020-12-08 ENCOUNTER — Telehealth: Payer: Self-pay

## 2020-12-08 NOTE — Telephone Encounter (Signed)
Pump having monitor stalls, four motor stalls in month of June. Called [employer] to see what to do and they didn't given any recommendation just said it could be environmental or could be the pump. Had pump since October 2020. No change as far as the patient states. Please advise.

## 2020-12-16 ENCOUNTER — Encounter: Payer: Self-pay | Admitting: Physical Medicine and Rehabilitation

## 2020-12-16 ENCOUNTER — Other Ambulatory Visit: Payer: Self-pay

## 2020-12-16 ENCOUNTER — Encounter
Payer: BLUE CROSS/BLUE SHIELD | Attending: Physical Medicine and Rehabilitation | Admitting: Physical Medicine and Rehabilitation

## 2020-12-16 VITALS — BP 114/78 | HR 86 | Temp 98.8°F

## 2020-12-16 DIAGNOSIS — T85615A Breakdown (mechanical) of other nervous system device, implant or graft, initial encounter: Secondary | ICD-10-CM | POA: Insufficient documentation

## 2020-12-16 DIAGNOSIS — Z993 Dependence on wheelchair: Secondary | ICD-10-CM | POA: Diagnosis present

## 2020-12-16 DIAGNOSIS — Z978 Presence of other specified devices: Secondary | ICD-10-CM | POA: Insufficient documentation

## 2020-12-16 DIAGNOSIS — G8220 Paraplegia, unspecified: Secondary | ICD-10-CM | POA: Insufficient documentation

## 2020-12-16 DIAGNOSIS — R269 Unspecified abnormalities of gait and mobility: Secondary | ICD-10-CM | POA: Diagnosis present

## 2020-12-16 DIAGNOSIS — G35 Multiple sclerosis: Secondary | ICD-10-CM | POA: Diagnosis present

## 2020-12-16 DIAGNOSIS — M62838 Other muscle spasm: Secondary | ICD-10-CM | POA: Diagnosis present

## 2020-12-16 MED ORDER — OXYCODONE HCL 10 MG PO TABS: 10.0000 mg | ORAL_TABLET | ORAL | 0 refills | Status: DC | PRN

## 2020-12-16 NOTE — Progress Notes (Signed)
   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 Hoarseness AND dysarthria; not just 1 issue. Insomnia and ITB refills  Here for f/u on ITB pump and MS and spasticity- with husband Sharon Odom.      Critical alarm occurred- on 11/23/20 at 11:44 pm- event code of 03 every time.  Critical alarm occurred  11/20/20 at 10:17am Critical Alarm occurred 11/11/20- 7:26 pm Critical Alarm occurred 11/07/20 1:15pm  New refill date by 03/05/21  Didn't mention that amount taken out was different than was supposed to- was 5.2 ml this time that was removed from ITB pump- was refilled on 12/08/20  at 1:43 pm.    Doing well on Current Zanaflex dose- has helped some.  On 8 mg 3x/day. Doesn't feel like fixed the issue, but doing better than it was before.  L great toe is a little-moderate better, and not helping LUE as much as she wanted.     Spoke to Dr Epimenio Foot about MRI's- will do brain MRI now- and if new or worsening Sx's- will also do Cervical MRI.  Question she has is that L hand and fingers is worse since last year.  Feels like it's the dexterity, not the strength -  Ex: L hand eventually  Clawing" up  L hand and fingers are painful- not burning, but it's a constant achiness of entire fingers (like joint pain, but in entire hand) not just joints). If going to open anything, fingers stay in that position, of trying to open Gotten worse since last year  Describes L thumb feels like "big magnet".    Feels like something is wrapped around LLE/ like python or octopus that's constricting it/tight around it.   Exam: Awake, alert, dressed really cute; in manual w/c, accompanied by husband, NAD L hand fingers curled up more than usual- However MAS of 1+- not severe Has tenodesis noted with Wrist extension.   Fingers slightly claw at rest, but as said above spasticity not bad, but looks like posturing.  (Usually gets botox for spasticity of LUE- was  last done 10/21?)  MAS of 3 in L shoulder with external rotation; and MAS of 2 in L elbow  Plan: Interrogated ITB pump- NO ALERTS since 12/08/20- will have pt come back next Wednesday at noon with [employer] rep to figure out what's going on with alarms.   I think the cervical MRI makes sense next time as well as brain MRI. Suggest to Dr Epimenio Foot.   Pt's spasticity of LUE was 1+ in LUE elbow and shoulder in last 2 visits- but now it's MAS of 2 in elbow and 3 in Shoulder esp with external rotation- think we are treating spasticity but not as fast as it's developing, so the questions is, why is it getting worse. Can try to increase ITB slightly next week without Prialt. Don't want to increase until we know what's going on with critical alarms.  Assured her we can increase the dose without changing the mcg/ml concentration.  Still having lingering sie effects from Prialt. Increases her pain levels.  Con't Wellbutrin, Oxycodone as needed; and Zanaflex and Oral Baclofen.  Refilled Oxycodone 10 mg QID prn #120- last refill was 07/1420- so is due! F/U next week at noon on Wednesday. Already schedule.    I spent a total of 31 minutes on visit- as detailed above.

## 2020-12-16 NOTE — Patient Instructions (Signed)
Plan: Interrogated ITB pump- NO ALERTS since 12/08/20- will have pt come back next Wednesday at noon with [employer] rep to figure out what's going on with alarms.   I think the cervical MRI makes sense next time as well as brain MRI. Suggest to Dr Epimenio Foot.   Pt's spasticity of LUE was 1+ in LUE elbow and shoulder in last 2 visits- but now it's MAS of 2 in elbow and 3 in Shoulder esp with external rotation- think we are treating spasticity but not as fast as it's developing, so the questions is, why is it getting worse. Can try to increase ITB slightly next week without Prialt. Don't want to increase until we know what's going on with critical alarms.  Assured her we can increase the dose without changing the mcg/ml concentration.  Still having lingering sie effects from Prialt. Increases her pain levels.  Con't Wellbutrin, Oxycodone as needed; and Zanaflex and Oral Baclofen.  Refilled Oxycodone 10 mg QID prn #120- last refill was 07/1420- so is due! F/U next week at noon on Wednesday. Already schedule.

## 2020-12-19 ENCOUNTER — Other Ambulatory Visit: Payer: Self-pay | Admitting: *Deleted

## 2020-12-19 DIAGNOSIS — G35 Multiple sclerosis: Secondary | ICD-10-CM

## 2020-12-21 ENCOUNTER — Encounter (HOSPITAL_BASED_OUTPATIENT_CLINIC_OR_DEPARTMENT_OTHER): Payer: BLUE CROSS/BLUE SHIELD | Admitting: Physical Medicine and Rehabilitation

## 2020-12-21 ENCOUNTER — Other Ambulatory Visit: Payer: Self-pay

## 2020-12-21 ENCOUNTER — Encounter: Payer: Self-pay | Admitting: Physical Medicine and Rehabilitation

## 2020-12-21 VITALS — BP 116/73 | HR 82 | Temp 98.3°F | Ht 69.0 in

## 2020-12-21 DIAGNOSIS — T85698A Other mechanical complication of other specified internal prosthetic devices, implants and grafts, initial encounter: Secondary | ICD-10-CM | POA: Insufficient documentation

## 2020-12-21 DIAGNOSIS — T85615A Breakdown (mechanical) of other nervous system device, implant or graft, initial encounter: Secondary | ICD-10-CM

## 2020-12-21 DIAGNOSIS — Z993 Dependence on wheelchair: Secondary | ICD-10-CM

## 2020-12-21 DIAGNOSIS — M62838 Other muscle spasm: Secondary | ICD-10-CM

## 2020-12-21 DIAGNOSIS — Z978 Presence of other specified devices: Secondary | ICD-10-CM

## 2020-12-21 NOTE — Progress Notes (Signed)
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 Hoarseness AND dysarthria; not just 1 issue. Insomnia and ITB refills  Here for f/u on ITB pump and MS and spasticity- with husband [name].     Pt hasn't had any other pump malfunctions since 6/29 based on pump.  But pt clear no magnetic "interactions".   Less than 30 minutes every time.  Actually get shorter every time/for stalls.  No jewelry with magnetic claps- and doesn't rest anything on pump.  Does have magnetic watch band, but doesn't wear at night.    Discussed options.  Sounds like really need replace ITB pump.  Dr Harkins- has been replaced by Dr Stern to replace ITB pumps.    Will call Dr Stern and have ITB pump be replaced- so can get rescheduled.   Only day cannot get pump replaced is 8/11.    Interrogated pump; won't change dose; but called tech support; when in room with pt; worked with [employer] reps to discuss plan; Won't increase pump dose today due to pum issues/malfunctions.   I spent a total of 36 minutes on visit- all with pt in room/in room with pt.   Still having lingering side effects from Prialt and stopped 9 months ago.  

## 2020-12-21 NOTE — Patient Instructions (Signed)
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 Hoarseness AND dysarthria; not just 1 issue. Insomnia and ITB refills  Here for f/u on ITB pump and MS and spasticity- with husband Onalee Hua.     Pt hasn't had any other pump malfunctions since 6/29 based on pump.  But pt clear no magnetic "interactions".   Less than 30 minutes every time.  Actually get shorter every time/for stalls.  No jewelry with magnetic claps- and doesn't rest anything on pump.  Does have magnetic watch band, but doesn't wear at night.    Discussed options.  Sounds like really need replace ITB pump.  Dr Ollen Bowl- has been replaced by Dr Venetia Maxon to replace ITB pumps.    Will call Dr Venetia Maxon and have ITB pump be replaced- so can get rescheduled.   Only day cannot get pump replaced is 8/11.    Interrogated pump; won't change dose; but called tech support; when in room with pt; worked with [employer] reps to discuss plan; Won't increase pump dose today due to pum issues/malfunctions.   I spent a total of 36 minutes on visit- all with pt in room/in room with pt.   Still having lingering side effects from Prialt and stopped 9 months ago.

## 2020-12-22 NOTE — Telephone Encounter (Signed)
Changed [employer] auths to [facility] and faxed orders, they will reach out to patient to schedule.

## 2020-12-28 NOTE — Telephone Encounter (Signed)
Pt's orders were faxed to [facility] scheduling last week, they will reach out to her to schedule MRIs. Her authorization does not expire until the end of September.

## 2021-01-24 ENCOUNTER — Other Ambulatory Visit: Payer: Self-pay | Admitting: Neurosurgery

## 2021-02-01 ENCOUNTER — Other Ambulatory Visit: Payer: Self-pay | Admitting: Neurosurgery

## 2021-02-02 ENCOUNTER — Other Ambulatory Visit: Payer: Self-pay

## 2021-02-02 ENCOUNTER — Ambulatory Visit
Admission: RE | Admit: 2021-02-02 | Discharge: 2021-02-02 | Disposition: A | Discharge status: Home / Self Care | Payer: BLUE CROSS/BLUE SHIELD | Source: Ambulatory Visit | Attending: Neurology | Admitting: Neurology

## 2021-02-02 DIAGNOSIS — G35 Multiple sclerosis: Secondary | ICD-10-CM | POA: Diagnosis not present

## 2021-02-02 MED ORDER — GADOBENATE DIMEGLUMINE 529 MG/ML IV SOLN
11.0000 mL | Freq: Once | INTRAVENOUS | Status: AC | PRN
  Administered 2021-02-02: 11 mL via INTRAVENOUS

## 2021-02-13 NOTE — Progress Notes (Addendum)
Surgical Instructions    Your procedure is scheduled on Friday, September 23rd, 2022.   Report to [facility] Main Entrance "A" at 09:25 A.M., then check in with the Admitting office.  Call this number if you have problems the morning of surgery:  [phone]   If you have any questions prior to your surgery date call [phone]: Open Monday-Friday 8am-4pm    Remember:  Do not eat after midnight the night before your surgery  You may drink clear liquids until 08:25 the morning of your surgery.   Clear liquids allowed are: Water, Non-Citrus Juices (without pulp), Carbonated Beverages, Clear Tea, Black Coffee ONLY (NO MILK, CREAM OR POWDERED CREAMER of any kind), and Gatorade    Take these medicines the morning of surgery with A SIP OF WATER:  baclofen (LIORESAL) buPROPion (WELLBUTRIN XL) dalfampridine  famotidine (PEPCID)  montelukast (SINGULAIR)  tiZANidine (ZANAFLEX)  trospium (SANCTURA)  If needed:  acetaminophen (TYLENOL)  albuterol (VENTOLIN HFA) - please, bring the inhaler with you the day of surgery Oxycodone HCl   As of today, STOP taking any Aspirin (unless otherwise instructed by your surgeon) Aleve, Naproxen, Ibuprofen, Motrin, Advil, Goody's, BC's, all herbal medications, fish oil, and all vitamins.           Do not wear jewelry or makeup Do not wear lotions, powders, perfumes, or deodorant. Do not shave 48 hours prior to surgery.   Do not bring valuables to the hospital. DO Not wear nail polish, gel polish, artificial nails, or any other type of covering on natural nails including finger and toenails. If patients have artificial nails, gel coating, etc. that need to be removed by a nail salon please have this removed prior to surgery or surgery may need to be canceled/delayed if the surgeon/ anesthesia feels like the patient is unable to be adequately monitored.              [facility] is not responsible for any belongings or valuables.  Do NOT Smoke  (Tobacco/Vaping)  24 hours prior to your procedure If you use a CPAP at night, you may bring your mask for your overnight stay.   Contacts, glasses, dentures or bridgework may not be worn into surgery, please bring cases for these belongings   For patients admitted to the hospital, discharge time will be determined by your treatment team.   Patients discharged the day of surgery will not be allowed to drive home, and someone needs to stay with them for 24 hours.  NO VISITORS WILL BE ALLOWED IN PRE-OP WHERE PATIENTS GET READY FOR SURGERY.  ONLY 1 SUPPORT PERSON MAY BE PRESENT WHILE YOU ARE IN SURGERY.  IF YOU ARE TO BE ADMITTED, ONCE YOU ARE IN YOUR ROOM YOU WILL BE ALLOWED TWO (2) VISITORS.  Minor children may have two parents present. Special consideration for safety and communication needs will be reviewed on a case by case basis.  Special instructions:    Oral Hygiene is also important to reduce your risk of infection.  Remember - BRUSH YOUR TEETH THE MORNING OF SURGERY WITH YOUR REGULAR TOOTHPASTE   [facility]- Preparing For Surgery  Before surgery, you can play an important role. Because skin is not sterile, your skin needs to be as free of germs as possible. You can reduce the number of germs on your skin by washing with CHG (chlorahexidine gluconate) Soap before surgery.  CHG is an antiseptic cleaner which kills germs and bonds with the skin to continue killing germs  even after washing.     Please do not use if you have an allergy to CHG or antibacterial soaps. If your skin becomes reddened/irritated stop using the CHG.  Do not shave (including legs and underarms) for at least 48 hours prior to first CHG shower. It is OK to shave your face.  Please follow these instructions carefully.     Shower the NIGHT BEFORE SURGERY and the MORNING OF SURGERY with CHG Soap.   If you chose to wash your hair, wash your hair first as usual with your normal shampoo. After you shampoo, rinse your  hair and body thoroughly to remove the shampoo.  Then [employer] and genitals (private parts) with your normal soap and rinse thoroughly to remove soap.  After that Use CHG Soap as you would any other liquid soap. You can apply CHG directly to the skin and wash gently with a scrungie or a clean washcloth.   Apply the CHG Soap to your body ONLY FROM THE NECK DOWN.  Do not use on open wounds or open sores. Avoid contact with your eyes, ears, mouth and genitals (private parts). Wash Face and genitals (private parts)  with your normal soap.   Wash thoroughly, paying special attention to the area where your surgery will be performed.  Thoroughly rinse your body with warm water from the neck down.  DO NOT shower/wash with your normal soap after using and rinsing off the CHG Soap.  Pat yourself dry with a CLEAN TOWEL.  Wear CLEAN PAJAMAS to bed the night before surgery  Place CLEAN SHEETS on your bed the night before your surgery  DO NOT SLEEP WITH PETS.   Day of Surgery:  Take a shower with CHG soap. Wear Clean/Comfortable clothing the morning of surgery Do not apply any deodorants/lotions.   Remember to brush your teeth WITH YOUR REGULAR TOOTHPASTE.   Please read over the following fact sheets that you were given.

## 2021-02-14 ENCOUNTER — Other Ambulatory Visit: Payer: Self-pay

## 2021-02-14 ENCOUNTER — Encounter (HOSPITAL_COMMUNITY)
Admission: RE | Admit: 2021-02-14 | Discharge: 2021-02-14 | Disposition: A | Discharge status: Home / Self Care | Payer: BLUE CROSS/BLUE SHIELD | Source: Ambulatory Visit | Attending: Neurosurgery | Admitting: Neurosurgery

## 2021-02-14 ENCOUNTER — Encounter (HOSPITAL_COMMUNITY): Payer: Self-pay

## 2021-02-14 DIAGNOSIS — Z20822 Contact with and (suspected) exposure to covid-19: Secondary | ICD-10-CM | POA: Insufficient documentation

## 2021-02-14 DIAGNOSIS — Z01818 Encounter for other preprocedural examination: Secondary | ICD-10-CM | POA: Insufficient documentation

## 2021-02-14 DIAGNOSIS — G35 Multiple sclerosis: Secondary | ICD-10-CM | POA: Diagnosis not present

## 2021-02-14 DIAGNOSIS — J45909 Unspecified asthma, uncomplicated: Secondary | ICD-10-CM | POA: Diagnosis not present

## 2021-02-14 DIAGNOSIS — Z79899 Other long term (current) drug therapy: Secondary | ICD-10-CM | POA: Diagnosis not present

## 2021-02-14 DIAGNOSIS — M62838 Other muscle spasm: Secondary | ICD-10-CM | POA: Diagnosis not present

## 2021-02-14 DIAGNOSIS — F17210 Nicotine dependence, cigarettes, uncomplicated: Secondary | ICD-10-CM | POA: Diagnosis not present

## 2021-02-14 DIAGNOSIS — Z4549 Encounter for adjustment and management of other implanted nervous system device: Secondary | ICD-10-CM | POA: Diagnosis present

## 2021-02-14 LAB — COMPREHENSIVE METABOLIC PANEL
ALT: 19 U/L (ref 0–44)
AST: 19 U/L (ref 15–41)
Albumin: 4 g/dL (ref 3.5–5.0)
Alkaline Phosphatase: 57 U/L (ref 38–126)
Anion gap: 7 (ref 5–15)
BUN: 7 mg/dL (ref 6–20)
CO2: 25 mmol/L (ref 22–32)
Calcium: 9.2 mg/dL (ref 8.9–10.3)
Chloride: 106 mmol/L (ref 98–111)
Creatinine, Ser: 0.73 mg/dL (ref 0.44–1.00)
GFR, Estimated: >60 mL/min (ref >60)
Glucose, Bld: 104 mg/dL — ABNORMAL HIGH (ref 70–99)
Potassium: 3.6 mmol/L (ref 3.5–5.1)
Sodium: 138 mmol/L (ref 135–145)
Total Bilirubin: 0.2 mg/dL — ABNORMAL LOW (ref 0.3–1.2)
Total Protein: 6.5 g/dL (ref 6.5–8.1)

## 2021-02-14 LAB — CBC
HCT: 38.3 % (ref 36.0–46.0)
Hemoglobin: 12 g/dL (ref 12.0–15.0)
MCH: 32.8 pg (ref 26.0–34.0)
MCHC: 31.3 g/dL (ref 30.0–36.0)
MCV: 104.6 fL — ABNORMAL HIGH (ref 80.0–100.0)
Platelets: 278 10*3/uL (ref 150–400)
RBC: 3.66 MIL/uL — ABNORMAL LOW (ref 3.87–5.11)
RDW: 12.6 % (ref 11.5–15.5)
WBC: 12.2 10*3/uL — ABNORMAL HIGH (ref 4.0–10.5)
nRBC: 0 % (ref 0.0–0.2)

## 2021-02-14 LAB — SARS CORONAVIRUS 2 (TAT 6-24 HRS): SARS Coronavirus 2: NEGATIVE

## 2021-02-14 NOTE — Progress Notes (Signed)
PCP - Annita Brod, MD Cardiologist - denies  PPM/ICD - denies Device Orders - n/a Rep Notified - n/a  Chest x-ray - n/a EKG - 09/120/2022 Stress Test - denies ECHO - denies Cardiac Cath - denies  Sleep Study - denies CPAP - n/a  Fasting Blood Sugar - n/a  Blood Thinner Instructions: n/a  Aspirin Instructions: Patient was instructed: As of today, STOP taking any Aspirin (unless otherwise instructed by your surgeon) Aleve, Naproxen, Ibuprofen, Motrin, Advil, Goody's, BC's, all herbal medications, fish oil, and all vitamins.  ERAS Protcol - yes PRE-SURGERY Ensure or G2- no  COVID TEST- 02/14/2021 in PAT   Anesthesia review: yes - abnormal EKG in PAT;  MS, patient on immunosuppressive therapy - didn't have this year, per patient  Patient denies shortness of breath, fever, cough and chest pain at PAT appointment   All instructions explained to the patient, with a verbal understanding of the material. Patient agrees to go over the instructions while at home for a better understanding. Patient also instructed to self quarantine after being tested for COVID-19. The opportunity to ask questions was provided.

## 2021-02-16 ENCOUNTER — Other Ambulatory Visit: Payer: Self-pay | Admitting: *Deleted

## 2021-02-16 MED ORDER — BACLOFEN 20 MG PO TABS: ORAL_TABLET | ORAL | 0 refills | Status: DC

## 2021-02-16 NOTE — H&P (Signed)
Patient ID:   [id number] Patient: Sharon Odom  Date of Birth: 1970/06/19 Visit Type: Office Visit   Date: 01/19/2021 02:45 PM Provider: Danae Orleans. Venetia Maxon MD   This 50 year old female presents for neck pain.  HISTORY OF PRESENT ILLNESS: 1.  neck pain  Sharon Odom, 50 year old female, visits for evaluation and discussion of baclofen pump replacement.  She has seen Dr. Della Goo since 2018 and Dr. Berline Chough since hospital discharge in 2020.  Dr. Berline Chough called to report pump malfunctions over the past several months.  No evidence of tubing compromise.  After consultation with [employer], recommendation is for baclofen pump replacement.  History:  Multiple sclerosis, seasonal asthma, arthritis Surgical history:  Right fallopian tube 1992, right Morton's neuroma excision, baclofen pump placement left abdomen 02/18/2019 by Dr. Ollen Bowl, wisdom tooth extraction 30 years ago       PAST MEDICAL/SURGICAL HISTORY:   (Reviewed, updated)   Disease/disorder Onset Date Management Date Comments   Baclofen Pump implant     Right fallopian tube removal     Right foot surgery   Arthritis     Asthma     Depression     Gerd     Hiatal Hernia     MS        PAST MEDICAL HISTORY, SURGICAL HISTORY, FAMILY HISTORY, SOCIAL HISTORY AND REVIEW OF SYSTEMS I have reviewed the patient's past medical, surgical, family and social history as well as the comprehensive review of systems as included on the [facility] history form dated 01/19/2021, which I have signed.  Family History:  (Reviewed, updated) Relationship Family Member Name Deceased Age at Death Condition Onset Age Cause of Death Mother    Diabetes mellitus  N    Social History:  (Reviewed, updated) Tobacco use reviewed. Preferred language is [place].   MARITAL STATUS/FAMILY/SOCIAL SUPPORT Currently married.   Tobacco use status: Cigarette smoker. Smoking status:  Current every day smoker.  SMOKING STATUS Type Smoking Status Usage Per Day Years Used Total Pack Years  Current every day smoker     TOBACCO CESSATION INFORMATION Date Counseled By Order Status Description Code Tobacco Cessation Information 03/26/2019 [place] Tobacco cessation counseling completed   Smoking cessation education 04/22/2019 [place] Tobacco cessation counseling completed   Smoking cessation education 05/06/2019 [place] Tobacco cessation counseling completed   Smoking cessation education 05/18/2019 [place] Tobacco cessation counseling completed   Smoking cessation education 07/15/2019 [place] Tobacco cessation counseling completed   Smoking cessation education 08/04/2019 [place] Tobacco cessation counseling completed   Smoking cessation education 09/15/2019 [place] Tobacco cessation counseling completed   Smoking cessation education 09/24/2019 [place] Tobacco cessation counseling completed   Smoking cessation education 10/05/2019 [place] Tobacco cessation counseling completed   Smoking cessation education 11/05/2019 [place] Tobacco cessation counseling completed   Smoking cessation education 12/09/2019 [place] Tobacco cessation counseling completed   Smoking cessation education 12/30/2019 [place] Tobacco cessation counseling completed   Smoking cessation education 01/19/2021 [place] Tobacco cessation counseling completed   Smoking cessation education  TOBACCO/VAPING EXPOSURE No passive vaping exposure. No passive smoke exposure.       MEDICATIONS: (added, continued or stopped this visit) Started Medication Directions Instruction Stopped  albuterol sulfate 2.5 mg/3 mL (0.083 %) solution for nebulization inhale 3 milliliter by nebulization route 3 times every  day    baclofen 20 mg tablet take 1 tablet by oral route  up to 6 times every day as needed for spasms    Botox 100 unit injection   01/20/2021 04/27/2019 Breo Ellipta 200 mcg-25 mcg/dose powder for inhalation   01/20/2021  bupropion HCl XL 300 mg 24 hr tablet, extended release take 1 tablet by oral route  every day    dalfampridine ER 10 mg tablet,extended release,12 hr take 1 tablet by oral route  every 12 hours  01/20/2021  duloxetine 30 mg capsule,delayed release take 1 capsule by oral route  every day  01/20/2021  furosemide 20 mg tablet take 1 tablet by oral route  every day  01/20/2021  ibuprofen 200 mg tablet take 1 tablet by oral route  every 6 hours as needed with food as needed  01/20/2021  Lemtrada 12 mg/1.2 mL intravenous solution infuse 1.2 milliliter by intravenous route  every day over for 5 consecutive days  01/20/2021 04/15/2019 levetiracetam 1,000 mg tablet   01/20/2021  montelukast 10 mg tablet take 1 tablet by oral route  every day in the evening  01/20/2021  Nasacort 55 mcg nasal spray aerosol spray 2 spray by intranasal route  every day in each nostril  01/20/2021  nystatin 500,000 unit tablet   01/20/2021  oxybutynin chloride ER 10 mg tablet,extended release 24 hr take 1 tablet by oral route  every 3 days  01/20/2021 01/20/2020 Percocet 10 mg-325 mg tablet take 1 tablet by oral route  every 6 hours as needed (DNF 01/28/11)    tizanidine 4 mg tablet take 2 tablets by oral route 3 times every day    trospium 20 mg tablet take 1 tablet by oral route 2 times every day on an empty stomach    Tylenol 8 Hour 650 mg tablet,extended release take 3 tablet by oral route  every 8 hours as needed swallowing whole with water. Do not break, crush, dissolve and/or chew.    valacyclovir 500 mg tablet take 1 tablet by oral route 2 times every day as needed  01/20/2021  Vitamin B-12  2,500 mcg sublingual  tablet   01/20/2021  Vitamin D3 125 mcg (5,000 unit) tablet   01/20/2021  Zantac-360 (famotidine) 10 mg tablet       ALLERGIES: Ingredient Reaction Medication Name Comment MORPHINE HCL    PREGABALIN  Lyrica weight gain  Reviewed, no changes.   REVIEW OF SYSTEMS  See scanned patient registration form, dated 01/19/2021, signed and dated on 01/19/2021  Review of Systems Details System Neg/Pos Details Constitutional Negative Chills, Fatigue, Fever, Malaise, Night sweats, Weight gain and Weight loss. ENMT Negative Ear drainage, Hearing loss, Nasal drainage, Otalgia, Sinus pressure and Sore throat. Eyes Negative Eye discharge, Eye pain and Vision changes. Respiratory Negative Chronic cough, Cough, Dyspnea, Known TB exposure and Wheezing. Cardio Negative Chest pain, Claudication, Edema and Irregular heartbeat/palpitations. GI Negative Abdominal pain, Blood in stool, Change in stool pattern, Constipation, Decreased appetite, Diarrhea, Heartburn, Nausea and Vomiting. GU Negative Dysuria, Hematuria, Polyuria (Genitourinary), Urinary frequency, Urinary incontinence and Urinary retention. Endocrine Negative Cold intolerance, Heat intolerance, Polydipsia and Polyphagia. Neuro Negative Dizziness, Extremity weakness, Gait disturbance, Headache, Memory impairment, Numbness in extremity, Seizures and Tremors. Psych Negative Anxiety, Depression and Insomnia. Integumentary Negative Brittle hair, Brittle nails, Change in shape/size of mole(s), Hair loss, Hirsutism, Hives, Pruritus, Rash and Skin lesion. MS Negative Back pain, Joint pain, Joint swelling, Muscle weakness and Neck pain. Hema/Lymph Negative Easy bleeding, Easy bruising and Lymphadenopathy. Allergic/Immuno Negative Contact allergy, Environmental allergies, Food allergies and Seasonal allergies. Reproductive Negative Breast discharge, Breast  lumps, Dysmenorrhea, Dyspareunia, History of  abnormal PAP smear, Hot flashes, Irregular menses and Vaginal discharge.  PHYSICAL EXAM:  Vitals Date Temp F BP Pulse Ht In Wt Lb BMI BSA Pain Score 01/19/2021  120/79 88 69 129 19.05  4/10   PHYSICAL EXAM Details General Level of Distress: no acute distress Overall Appearance: normal  Head and Face  Right Left  Fundoscopic Exam:  normal normal    Cardiovascular Cardiac: regular rate and rhythm without murmur  Right Left  Carotid Pulses: normal normal  Respiratory Lungs: clear to auscultation  Neurological Orientation: normal Recent and Remote Memory: normal Attention Span and Concentration:   normal Language: normal Fund of Knowledge: normal  Right Left Sensation: normal normal Upper Extremity Coordination: normal normal  Lower Extremity Coordination: normal normal  Musculoskeletal Gait and Station: normal  Right Left Upper Extremity Muscle Strength: normal normal Lower Extremity Muscle Strength: normal normal Upper Extremity Muscle Tone:  spastic spastic Lower Extremity Muscle Tone: spastic spastic   Motor Strength Upper and lower extremity motor strength was tested in the clinically pertinent muscles.     Deep Tendon Reflexes  Right Left Biceps: normal normal Triceps: normal normal Brachioradialis: normal normal Patellar: normal normal Achilles: normal normal  Cranial Nerves II. Optic Nerve/Visual Fields: normal III. Oculomotor: normal IV. Trochlear: normal V. Trigeminal: normal VI. Abducens: normal VII. Facial: normal VIII. Acoustic/Vestibular: normal IX. Glossopharyngeal: normal X. Vagus: normal XI. Spinal Accessory: normal XII. Hypoglossal: normal  Motor and other Tests Lhermittes: negative Rhomberg: negative Pronator drift: absent     Right Left Hoffman's: normal normal Clonus: normal normal Babinski: normal normal   Additional Findings:  The patient has generalized weakness and dysarthria.  She is in a  wheelchair.    IMPRESSION:  The patient has a malfunctioning baclofen pump which needs to be replaced.  She is dependent on this for help with her spasticity due to MS. Her normal refill is in October.  She wishes to have this done before October.  We have planned on changing her pump on 02/17/2021.  PLAN: Pump revision for intrathecal baclofen pump on 02/17/2021.  The risks and benefits were discussed in detail with the patient and she wishes to proceed with surgery.  Orders: Office Procedures/Services: Assessment Service Comments  Need recent baclofen pump rate and does and refill notes from Dr.Lovorn's office   Instruction(s)/Education: Assessment Instruction  Tobacco cessation counseling Z68.1 Dietary management education, guidance, and counseling  Completed Orders (this encounter) Order Details Reason Side Interpretation Result Initial Treatment Date Region Tobacco cessation counseling        Dietary management education, guidance, and counseling Encouraged patient to eat well balanced diet.        Assessment/Plan  # Detail Type Description  1. Assessment Multiple sclerosis (G35).     2. Assessment Presence of intrathecal pump (Z96.89).     3. Assessment Muscle spasticity (M62.838).     4. Assessment Body mass index (BMI) 19.9 or less, adult (Z68.1).  Plan Orders Today's instructions / counseling include(s) Dietary management education, guidance, and counseling. Clinical information/comments: Encouraged patient to eat well balanced diet.     5. Other Orders Orders not associated to today's assessments.  Plan Orders Need recent baclofen pump rate and does and refill notes from Dr.Lovorn's office.    Pain Management Plan Pain Scale: 4/10. Method: Numeric Pain Intensity Scale. Location: neck. Onset: 08/13/2014. Duration: varies. Quality: discomforting. Pain management follow-up plan of care: Patient will continue  medication management.Marland Kitchen  Provider:  Danae Orleans. Venetia Maxon MD  01/20/2021 05:41 PM    Dictation edited by: Danae Orleans. Venetia Maxon    CC Providers: Annita Brod [facility] [address] [place],  [place]  [address]-   Despina Arias  [address] [place], [place] [address]-               Electronically signed by Danae Orleans Venetia Maxon MD on 01/20/2021 05:41 PM

## 2021-02-17 ENCOUNTER — Encounter (HOSPITAL_COMMUNITY)
Admission: AD | Disposition: A | Discharge status: Home / Self Care | Payer: Self-pay | Source: Home / Self Care | Attending: Neurosurgery

## 2021-02-17 ENCOUNTER — Observation Stay (HOSPITAL_COMMUNITY)
Admission: AD | Admit: 2021-02-17 | Discharge: 2021-02-17 | Disposition: A | Discharge status: Home / Self Care | Payer: BLUE CROSS/BLUE SHIELD | Attending: Neurosurgery | Admitting: Neurosurgery

## 2021-02-17 ENCOUNTER — Other Ambulatory Visit: Payer: Self-pay

## 2021-02-17 ENCOUNTER — Ambulatory Visit (HOSPITAL_COMMUNITY): Payer: BLUE CROSS/BLUE SHIELD | Admitting: Physician Assistant

## 2021-02-17 ENCOUNTER — Encounter (HOSPITAL_COMMUNITY): Payer: Self-pay | Admitting: Neurosurgery

## 2021-02-17 ENCOUNTER — Ambulatory Visit (HOSPITAL_COMMUNITY): Payer: BLUE CROSS/BLUE SHIELD | Admitting: Certified Registered"

## 2021-02-17 DIAGNOSIS — R252 Cramp and spasm: Secondary | ICD-10-CM | POA: Diagnosis present

## 2021-02-17 DIAGNOSIS — Z885 Allergy status to narcotic agent status: Secondary | ICD-10-CM

## 2021-02-17 DIAGNOSIS — Z880 Allergy status to penicillin: Secondary | ICD-10-CM

## 2021-02-17 DIAGNOSIS — G35 Multiple sclerosis: Secondary | ICD-10-CM | POA: Diagnosis present

## 2021-02-17 DIAGNOSIS — M62838 Other muscle spasm: Secondary | ICD-10-CM | POA: Insufficient documentation

## 2021-02-17 DIAGNOSIS — Z888 Allergy status to other drugs, medicaments and biological substances status: Secondary | ICD-10-CM

## 2021-02-17 DIAGNOSIS — Z4549 Encounter for adjustment and management of other implanted nervous system device: Secondary | ICD-10-CM | POA: Diagnosis not present

## 2021-02-17 DIAGNOSIS — Z833 Family history of diabetes mellitus: Secondary | ICD-10-CM

## 2021-02-17 DIAGNOSIS — F17210 Nicotine dependence, cigarettes, uncomplicated: Secondary | ICD-10-CM | POA: Insufficient documentation

## 2021-02-17 DIAGNOSIS — J45909 Unspecified asthma, uncomplicated: Secondary | ICD-10-CM | POA: Insufficient documentation

## 2021-02-17 DIAGNOSIS — Z79899 Other long term (current) drug therapy: Secondary | ICD-10-CM | POA: Insufficient documentation

## 2021-02-17 DIAGNOSIS — Z20822 Contact with and (suspected) exposure to covid-19: Secondary | ICD-10-CM | POA: Insufficient documentation

## 2021-02-17 DIAGNOSIS — Z9689 Presence of other specified functional implants: Secondary | ICD-10-CM | POA: Diagnosis present

## 2021-02-17 DIAGNOSIS — Z681 Body mass index (BMI) 19 or less, adult: Secondary | ICD-10-CM

## 2021-02-17 DIAGNOSIS — M1990 Unspecified osteoarthritis, unspecified site: Secondary | ICD-10-CM | POA: Diagnosis present

## 2021-02-17 HISTORY — PX: INTRATHECAL PUMP IMPLANT: SHX6809

## 2021-02-17 SURGERY — INTRATHECAL PUMP IMPLANT
Anesthesia: General | Site: Abdomen | Laterality: Right

## 2021-02-17 MED ORDER — FENTANYL CITRATE (PF) 100 MCG/2ML IJ SOLN
INTRAMUSCULAR | Status: DC | PRN
  Administered 2021-02-17: 50 ug via INTRAVENOUS

## 2021-02-17 MED ORDER — FENTANYL CITRATE (PF) 250 MCG/5ML IJ SOLN
INTRAMUSCULAR | Status: AC
  Filled 2021-02-17: qty 5

## 2021-02-17 MED ORDER — POLYETHYLENE GLYCOL 3350 17 G PO PACK: 17.0000 g | PACK | Freq: Every day | ORAL | Status: DC | PRN

## 2021-02-17 MED ORDER — CEFAZOLIN SODIUM-DEXTROSE 2-4 GM/100ML-% IV SOLN
INTRAVENOUS | Status: AC
  Filled 2021-02-17: qty 100

## 2021-02-17 MED ORDER — ACETAMINOPHEN 325 MG PO TABS: 325.0000 mg | ORAL_TABLET | ORAL | Status: DC | PRN

## 2021-02-17 MED ORDER — SENNOSIDES-DOCUSATE SODIUM 8.6-50 MG PO TABS: 3.0000 | ORAL_TABLET | Freq: Every day | ORAL | Status: DC

## 2021-02-17 MED ORDER — SODIUM CHLORIDE 0.9 % IV SOLN
250.0000 mL | INTRAVENOUS | Status: DC
  Administered 2021-02-17: 250 mL via INTRAVENOUS

## 2021-02-17 MED ORDER — MENTHOL 3 MG MT LOZG: 1.0000 | LOZENGE | OROMUCOSAL | Status: DC | PRN

## 2021-02-17 MED ORDER — OXYCODONE HCL 5 MG/5ML PO SOLN: 5.0000 mg | Freq: Once | ORAL | Status: DC | PRN

## 2021-02-17 MED ORDER — FAMOTIDINE 20 MG PO TABS
10.0000 mg | ORAL_TABLET | Freq: Every day | ORAL | Status: DC
  Filled 2021-02-17: qty 1

## 2021-02-17 MED ORDER — EPHEDRINE 5 MG/ML INJ
INTRAVENOUS | Status: AC
  Filled 2021-02-17: qty 5

## 2021-02-17 MED ORDER — OXYCODONE HCL 5 MG PO TABS: 5.0000 mg | ORAL_TABLET | Freq: Once | ORAL | Status: DC | PRN

## 2021-02-17 MED ORDER — NAPROXEN SODIUM 220 MG PO TABS: 440.0000 mg | ORAL_TABLET | Freq: Two times a day (BID) | ORAL | Status: DC | PRN

## 2021-02-17 MED ORDER — PROPOFOL 10 MG/ML IV BOLUS
INTRAVENOUS | Status: AC
  Filled 2021-02-17: qty 20

## 2021-02-17 MED ORDER — CEFAZOLIN SODIUM-DEXTROSE 2-4 GM/100ML-% IV SOLN
2.0000 g | INTRAVENOUS | Status: AC
  Administered 2021-02-17: 2 g via INTRAVENOUS

## 2021-02-17 MED ORDER — DEXAMETHASONE SODIUM PHOSPHATE 10 MG/ML IJ SOLN
INTRAMUSCULAR | Status: DC | PRN
  Administered 2021-02-17: 5 mg via INTRAVENOUS

## 2021-02-17 MED ORDER — VANCOMYCIN HCL 1000 MG IV SOLR
INTRAVENOUS | Status: DC | PRN
  Administered 2021-02-17: 1000 mg via TOPICAL

## 2021-02-17 MED ORDER — CHLORHEXIDINE GLUCONATE 0.12 % MT SOLN
15.0000 mL | Freq: Once | OROMUCOSAL | Status: AC
  Administered 2021-02-17: 15 mL via OROMUCOSAL

## 2021-02-17 MED ORDER — PHENYLEPHRINE 40 MCG/ML (10ML) SYRINGE FOR IV PUSH (FOR BLOOD PRESSURE SUPPORT)
PREFILLED_SYRINGE | INTRAVENOUS | Status: DC | PRN
  Administered 2021-02-17: 120 ug via INTRAVENOUS
  Administered 2021-02-17: 160 ug via INTRAVENOUS
  Administered 2021-02-17 (×2): 120 ug via INTRAVENOUS

## 2021-02-17 MED ORDER — BACITRACIN ZINC 500 UNIT/GM EX OINT
TOPICAL_OINTMENT | CUTANEOUS | Status: AC
  Filled 2021-02-17: qty 28.35

## 2021-02-17 MED ORDER — OXYCODONE HCL 5 MG PO TABS
5.0000 mg | ORAL_TABLET | ORAL | Status: DC | PRN
  Administered 2021-02-17: 5 mg via ORAL
  Filled 2021-02-17: qty 1

## 2021-02-17 MED ORDER — ACETAMINOPHEN 325 MG PO TABS: 650.0000 mg | ORAL_TABLET | ORAL | Status: DC | PRN

## 2021-02-17 MED ORDER — LIDOCAINE HCL (PF) 2 % IJ SOLN
INTRAMUSCULAR | Status: AC
  Filled 2021-02-17: qty 5

## 2021-02-17 MED ORDER — ACETAMINOPHEN 160 MG/5ML PO SOLN: 325.0000 mg | ORAL | Status: DC | PRN

## 2021-02-17 MED ORDER — ROCURONIUM BROMIDE 10 MG/ML (PF) SYRINGE
PREFILLED_SYRINGE | INTRAVENOUS | Status: AC
  Filled 2021-02-17: qty 10

## 2021-02-17 MED ORDER — 0.9 % SODIUM CHLORIDE (POUR BTL) OPTIME
TOPICAL | Status: DC | PRN
  Administered 2021-02-17: 1000 mL

## 2021-02-17 MED ORDER — LIDOCAINE-EPINEPHRINE 1 %-1:100000 IJ SOLN
INTRAMUSCULAR | Status: DC | PRN
  Administered 2021-02-17: 5 mL

## 2021-02-17 MED ORDER — ALUM & MAG HYDROXIDE-SIMETH 200-200-20 MG/5ML PO SUSP: 30.0000 mL | Freq: Four times a day (QID) | ORAL | Status: DC | PRN

## 2021-02-17 MED ORDER — DARIFENACIN HYDROBROMIDE ER 7.5 MG PO TB24: 7.5000 mg | ORAL_TABLET | Freq: Every day | ORAL | Status: DC

## 2021-02-17 MED ORDER — MONTELUKAST SODIUM 10 MG PO TABS: 10.0000 mg | ORAL_TABLET | Freq: Every day | ORAL | Status: DC

## 2021-02-17 MED ORDER — VITAMIN B-12 1000 MCG PO TABS: 2500.0000 ug | ORAL_TABLET | Freq: Every day | ORAL | Status: DC

## 2021-02-17 MED ORDER — ACETAMINOPHEN 500 MG PO TABS: 1000.0000 mg | ORAL_TABLET | Freq: Every day | ORAL | Status: DC

## 2021-02-17 MED ORDER — FENTANYL CITRATE (PF) 100 MCG/2ML IJ SOLN: 25.0000 ug | INTRAMUSCULAR | Status: DC | PRN

## 2021-02-17 MED ORDER — BACLOFEN 10 MG PO TABS
20.0000 mg | ORAL_TABLET | ORAL | Status: DC | PRN
  Administered 2021-02-17: 20 mg via ORAL
  Filled 2021-02-17: qty 2

## 2021-02-17 MED ORDER — ONDANSETRON HCL 4 MG/2ML IJ SOLN
INTRAMUSCULAR | Status: AC
  Filled 2021-02-17: qty 2

## 2021-02-17 MED ORDER — CHLORHEXIDINE GLUCONATE CLOTH 2 % EX PADS: 6.0000 | MEDICATED_PAD | Freq: Once | CUTANEOUS | Status: DC

## 2021-02-17 MED ORDER — FLEET ENEMA 7-19 GM/118ML RE ENEM: 1.0000 | ENEMA | Freq: Once | RECTAL | Status: DC | PRN

## 2021-02-17 MED ORDER — AMISULPRIDE (ANTIEMETIC) 5 MG/2ML IV SOLN: 10.0000 mg | Freq: Once | INTRAVENOUS | Status: DC | PRN

## 2021-02-17 MED ORDER — LIDOCAINE 2% (20 MG/ML) 5 ML SYRINGE
INTRAMUSCULAR | Status: DC | PRN
  Administered 2021-02-17: 40 mg via INTRAVENOUS

## 2021-02-17 MED ORDER — ALEMTUZUMAB 12 MG/1.2ML IV SOLN: 12.0000 mg | INTRAVENOUS | Status: DC

## 2021-02-17 MED ORDER — HYDROCODONE-ACETAMINOPHEN 5-325 MG PO TABS
2.0000 | ORAL_TABLET | ORAL | Status: DC | PRN
Start: 2021-02-17 — End: 2021-02-18

## 2021-02-17 MED ORDER — OXYCODONE HCL 10 MG PO TABS: 10.0000 mg | ORAL_TABLET | ORAL | Status: DC | PRN

## 2021-02-17 MED ORDER — BUPROPION HCL ER (XL) 300 MG PO TB24: 300.0000 mg | ORAL_TABLET | Freq: Every day | ORAL | Status: DC

## 2021-02-17 MED ORDER — BISACODYL 10 MG RE SUPP: 10.0000 mg | Freq: Every day | RECTAL | Status: DC | PRN

## 2021-02-17 MED ORDER — DALFAMPRIDINE ER 10 MG PO TB12: 10.0000 mg | ORAL_TABLET | Freq: Two times a day (BID) | ORAL | Status: DC

## 2021-02-17 MED ORDER — PROPOFOL 10 MG/ML IV BOLUS
INTRAVENOUS | Status: DC | PRN
  Administered 2021-02-17: 160 mg via INTRAVENOUS

## 2021-02-17 MED ORDER — EPHEDRINE SULFATE-NACL 50-0.9 MG/10ML-% IV SOSY
PREFILLED_SYRINGE | INTRAVENOUS | Status: DC | PRN
  Administered 2021-02-17 (×5): 5 mg via INTRAVENOUS

## 2021-02-17 MED ORDER — KETOROLAC TROMETHAMINE 15 MG/ML IJ SOLN
15.0000 mg | Freq: Four times a day (QID) | INTRAMUSCULAR | Status: DC
  Administered 2021-02-17: 15 mg via INTRAVENOUS
  Filled 2021-02-17: qty 1

## 2021-02-17 MED ORDER — ACETAMINOPHEN 500 MG PO TABS: 1000.0000 mg | ORAL_TABLET | Freq: Four times a day (QID) | ORAL | Status: DC | PRN

## 2021-02-17 MED ORDER — DEXAMETHASONE SODIUM PHOSPHATE 10 MG/ML IJ SOLN
INTRAMUSCULAR | Status: AC
  Filled 2021-02-17: qty 1

## 2021-02-17 MED ORDER — BACLOFEN 10 MG/20ML IT SOLN: 10.0000 mg | Freq: Once | INTRATHECAL | Status: DC

## 2021-02-17 MED ORDER — VITAMIN B-12 2500 MCG SL SUBL: 2500.0000 ug | SUBLINGUAL_TABLET | Freq: Every day | SUBLINGUAL | Status: DC

## 2021-02-17 MED ORDER — ONDANSETRON HCL 4 MG/2ML IJ SOLN: 4.0000 mg | Freq: Four times a day (QID) | INTRAMUSCULAR | Status: DC | PRN

## 2021-02-17 MED ORDER — BACLOFEN 40 MG/20ML IT SOLN
INTRATHECAL | Status: DC | PRN
  Administered 2021-02-17: 80 mg via INTRATHECAL

## 2021-02-17 MED ORDER — ACETAMINOPHEN 10 MG/ML IV SOLN: 1000.0000 mg | Freq: Once | INTRAVENOUS | Status: DC | PRN

## 2021-02-17 MED ORDER — LACTATED RINGERS IV SOLN: INTRAVENOUS | Status: DC

## 2021-02-17 MED ORDER — ALBUTEROL SULFATE HFA 108 (90 BASE) MCG/ACT IN AERS: 1.0000 | INHALATION_SPRAY | Freq: Four times a day (QID) | RESPIRATORY_TRACT | Status: DC | PRN

## 2021-02-17 MED ORDER — VANCOMYCIN HCL 1000 MG IV SOLR
INTRAVENOUS | Status: AC
  Filled 2021-02-17: qty 20

## 2021-02-17 MED ORDER — NAPROXEN 250 MG PO TABS: 500.0000 mg | ORAL_TABLET | Freq: Two times a day (BID) | ORAL | Status: DC | PRN

## 2021-02-17 MED ORDER — PHENOL 1.4 % MT LIQD: 1.0000 | OROMUCOSAL | Status: DC | PRN

## 2021-02-17 MED ORDER — ACETAMINOPHEN 650 MG RE SUPP: 650.0000 mg | RECTAL | Status: DC | PRN

## 2021-02-17 MED ORDER — PHENYLEPHRINE 40 MCG/ML (10ML) SYRINGE FOR IV PUSH (FOR BLOOD PRESSURE SUPPORT)
PREFILLED_SYRINGE | INTRAVENOUS | Status: AC
  Filled 2021-02-17: qty 10

## 2021-02-17 MED ORDER — PROMETHAZINE HCL 25 MG/ML IJ SOLN: 6.2500 mg | INTRAMUSCULAR | Status: DC | PRN

## 2021-02-17 MED ORDER — TIZANIDINE HCL 4 MG PO TABS
8.0000 mg | ORAL_TABLET | Freq: Three times a day (TID) | ORAL | Status: DC
  Administered 2021-02-17: 8 mg via ORAL
  Filled 2021-02-17: qty 2

## 2021-02-17 MED ORDER — DOCUSATE SODIUM 100 MG PO CAPS: 100.0000 mg | ORAL_CAPSULE | Freq: Two times a day (BID) | ORAL | Status: DC

## 2021-02-17 MED ORDER — LIDOCAINE-EPINEPHRINE 1 %-1:100000 IJ SOLN
INTRAMUSCULAR | Status: AC
  Filled 2021-02-17: qty 1

## 2021-02-17 MED ORDER — ALBUTEROL SULFATE (2.5 MG/3ML) 0.083% IN NEBU: 2.5000 mg | INHALATION_SOLUTION | Freq: Four times a day (QID) | RESPIRATORY_TRACT | Status: DC | PRN

## 2021-02-17 MED ORDER — SODIUM CHLORIDE 0.9% FLUSH
3.0000 mL | Freq: Two times a day (BID) | INTRAVENOUS | Status: DC
  Administered 2021-02-17: 3 mL via INTRAVENOUS

## 2021-02-17 MED ORDER — DIPHENHYDRAMINE HCL 25 MG PO CAPS: 50.0000 mg | ORAL_CAPSULE | Freq: Every day | ORAL | Status: DC

## 2021-02-17 MED ORDER — VITAMIN D 25 MCG (1000 UNIT) PO TABS: 5000.0000 [IU] | ORAL_TABLET | Freq: Every day | ORAL | Status: DC

## 2021-02-17 MED ORDER — MIDAZOLAM HCL 2 MG/2ML IJ SOLN
INTRAMUSCULAR | Status: DC | PRN
  Administered 2021-02-17: 2 mg via INTRAVENOUS

## 2021-02-17 MED ORDER — ONDANSETRON HCL 4 MG/2ML IJ SOLN
INTRAMUSCULAR | Status: DC | PRN
  Administered 2021-02-17: 4 mg via INTRAVENOUS

## 2021-02-17 MED ORDER — BUPIVACAINE HCL (PF) 0.5 % IJ SOLN
INTRAMUSCULAR | Status: DC | PRN
  Administered 2021-02-17: 5 mL

## 2021-02-17 MED ORDER — SODIUM CHLORIDE 0.9% FLUSH: 3.0000 mL | INTRAVENOUS | Status: DC | PRN

## 2021-02-17 MED ORDER — ORAL CARE MOUTH RINSE: 15.0000 mL | Freq: Once | OROMUCOSAL | Status: AC

## 2021-02-17 MED ORDER — CEFAZOLIN SODIUM-DEXTROSE 2-4 GM/100ML-% IV SOLN
2.0000 g | Freq: Three times a day (TID) | INTRAVENOUS | Status: DC
  Administered 2021-02-17: 2 g via INTRAVENOUS
  Filled 2021-02-17: qty 100

## 2021-02-17 MED ORDER — BUPIVACAINE HCL (PF) 0.5 % IJ SOLN
INTRAMUSCULAR | Status: AC
  Filled 2021-02-17: qty 30

## 2021-02-17 MED ORDER — KCL IN DEXTROSE-NACL 20-5-0.9 MEQ/L-%-% IV SOLN
INTRAVENOUS | Status: DC
  Filled 2021-02-17: qty 1000

## 2021-02-17 MED ORDER — THROMBIN 5000 UNITS EX SOLR
CUTANEOUS | Status: AC
  Filled 2021-02-17: qty 15000

## 2021-02-17 MED ORDER — ONDANSETRON HCL 4 MG PO TABS
4.0000 mg | ORAL_TABLET | Freq: Four times a day (QID) | ORAL | Status: DC | PRN
Start: 2021-02-17 — End: 2021-02-18

## 2021-02-17 MED ORDER — IBUPROFEN 200 MG PO TABS: 400.0000 mg | ORAL_TABLET | Freq: Three times a day (TID) | ORAL | Status: DC | PRN

## 2021-02-17 MED ORDER — PANTOPRAZOLE SODIUM 40 MG IV SOLR: 40.0000 mg | Freq: Every day | INTRAVENOUS | Status: DC

## 2021-02-17 MED ORDER — DIPHENHYDRAMINE-APAP (SLEEP) 25-500 MG PO TABS: 2.0000 | ORAL_TABLET | Freq: Every day | ORAL | Status: DC

## 2021-02-17 MED ORDER — CHLORHEXIDINE GLUCONATE 0.12 % MT SOLN
OROMUCOSAL | Status: AC
  Filled 2021-02-17: qty 15

## 2021-02-17 MED ORDER — MIDAZOLAM HCL 2 MG/2ML IJ SOLN
INTRAMUSCULAR | Status: AC
  Filled 2021-02-17: qty 2

## 2021-02-17 SURGICAL SUPPLY — 49 items
BAG COUNTER SPONGE SURGICOUNT (BAG) ×2 IMPLANT
BLADE SURG 10 STRL SS (BLADE) ×4 IMPLANT
BLADE SURG 15 STRL LF DISP TIS (BLADE) ×1 IMPLANT
BLADE SURG 15 STRL SS (BLADE) ×1
BOOT SUTURE AID YELLOW STND (SUTURE) ×2 IMPLANT
CABLE BIPOLOR RESECTION CORD (MISCELLANEOUS) ×2 IMPLANT
CANISTER SUCT 3000ML PPV (MISCELLANEOUS) ×2 IMPLANT
COVER MAYO STAND STRL (DRAPES) ×2 IMPLANT
DERMABOND ADVANCED (GAUZE/BANDAGES/DRESSINGS) ×1
DERMABOND ADVANCED .7 DNX12 (GAUZE/BANDAGES/DRESSINGS) ×1 IMPLANT
DRAPE INCISE IOBAN 85X60 (DRAPES) ×2 IMPLANT
DRAPE LAPAROTOMY 100X72X124 (DRAPES) ×2 IMPLANT
DRSG OPSITE POSTOP 4X6 (GAUZE/BANDAGES/DRESSINGS) ×2 IMPLANT
ELECT REM PT RETURN 9FT ADLT (ELECTROSURGICAL) ×2
ELECTRODE REM PT RTRN 9FT ADLT (ELECTROSURGICAL) ×1 IMPLANT
GAUZE 4X4 16PLY ~~LOC~~+RFID DBL (SPONGE) ×2 IMPLANT
GLOVE SRG 8 PF TXTR STRL LF DI (GLOVE) ×2 IMPLANT
GLOVE SURG ENC MOIS LTX SZ8 (GLOVE) ×4 IMPLANT
GLOVE SURG LTX SZ8 (GLOVE) ×4 IMPLANT
GLOVE SURG UNDER POLY LF SZ8 (GLOVE) ×2
GLOVE SURG UNDER POLY LF SZ8.5 (GLOVE) ×4 IMPLANT
GOWN STRL REUS W/ TWL LRG LVL3 (GOWN DISPOSABLE) ×1 IMPLANT
GOWN STRL REUS W/ TWL XL LVL3 (GOWN DISPOSABLE) ×1 IMPLANT
GOWN STRL REUS W/TWL 2XL LVL3 (GOWN DISPOSABLE) ×2 IMPLANT
GOWN STRL REUS W/TWL LRG LVL3 (GOWN DISPOSABLE) ×1
GOWN STRL REUS W/TWL XL LVL3 (GOWN DISPOSABLE) ×1
KIT BASIN OR (CUSTOM PROCEDURE TRAY) ×2 IMPLANT
KIT REFILL (MISCELLANEOUS) ×1
KIT REFILL CATH SYNCHROMED II (MISCELLANEOUS) ×1 IMPLANT
KIT TURNOVER KIT B (KITS) ×2 IMPLANT
NEEDLE HYPO 25X1 1.5 SAFETY (NEEDLE) ×2 IMPLANT
NS IRRIG 1000ML POUR BTL (IV SOLUTION) ×2 IMPLANT
PACK EENT II TURBAN DRAPE (CUSTOM PROCEDURE TRAY) ×2 IMPLANT
PENCIL BUTTON HOLSTER BLD 10FT (ELECTRODE) ×2 IMPLANT
PUMP SYNCHROMED II 40ML RESVR (Neuro Prosthesis/Implant) ×2 IMPLANT
SPONGE SURGIFOAM ABS GEL SZ50 (HEMOSTASIS) IMPLANT
SPONGE T-LAP 4X18 ~~LOC~~+RFID (SPONGE) ×2 IMPLANT
STAPLER SKIN PROX WIDE 3.9 (STAPLE) ×2 IMPLANT
SUT SILK 2 0 PERMA HAND 18 BK (SUTURE) ×6 IMPLANT
SUT VIC AB 0 CT1 18XCR BRD8 (SUTURE) ×1 IMPLANT
SUT VIC AB 0 CT1 8-18 (SUTURE) ×1
SUT VIC AB 2-0 CP2 18 (SUTURE) ×2 IMPLANT
SUT VIC AB 3-0 SH 8-18 (SUTURE) ×2 IMPLANT
SYR 3ML LL SCALE MARK (SYRINGE) ×2 IMPLANT
SYR CONTROL 10ML LL (SYRINGE) ×2 IMPLANT
TOWEL GREEN STERILE (TOWEL DISPOSABLE) ×2 IMPLANT
TOWEL GREEN STERILE FF (TOWEL DISPOSABLE) ×2 IMPLANT
TUBE CONNECTING 12X1/4 (SUCTIONS) ×2 IMPLANT
WATER STERILE IRR 1000ML POUR (IV SOLUTION) ×2 IMPLANT

## 2021-02-17 NOTE — Evaluation (Signed)
Occupational Therapy Evaluation Patient Details Name: Sharon Odom MRN: [id number] DOB: February 23, 1971 Today's Date: 02/17/2021   History of Present Illness This 50 y.o. female admitted for [facility] pump replacement due to current pump malfunction.  PMH includes:  MS, seasonal asthma, arthritis   Clinical Impression   Patient evaluated by Occupational Therapy with no further acute OT needs identified. All education has been completed and the patient has no further questions. Pt reports she feels she is at baseline and is able to complete ADLs at supervision - min guard assist.  See below for any follow-up Occupational Therapy or equipment needs. OT is signing off. Thank you for this referral.       Recommendations for follow up therapy are one component of a multi-disciplinary discharge planning process, led by the attending physician.  Recommendations may be updated based on patient status, additional functional criteria and insurance authorization.   Follow Up Recommendations  No OT follow up;Supervision - Intermittent    Equipment Recommendations  None recommended by OT    Recommendations for Other Services       Precautions / Restrictions Precautions Precautions: Fall Precaution Comments: pt reports intermittent falls at home "not that often" Restrictions Weight Bearing Restrictions: No      Mobility Bed Mobility Overal bed mobility: Modified Independent (has an adjustable bed)                  Transfers Overall transfer level: Needs assistance Equipment used: Rolling walker (2 wheeled) Transfers: Sit to/from [employer] Sit to Stand: Min guard Stand pivot transfers: Min guard       General transfer comment: min guard for safety.  Pt reports she feels she is at her baseline    Balance Overall balance assessment: Needs assistance Sitting-balance support: Feet supported Sitting balance-Leahy Scale: Good     Standing balance support: No upper  extremity supported Standing balance-Leahy Scale: Fair                             ADL either performed or assessed with clinical judgement   ADL Overall ADL's : At baseline                                       General ADL Comments: pt ambulated to BR, performed toilet transfer, grooming standing, and UB and LB dresssing with min guard assist using RW.  She reports she is at baseline     Vision Patient Visual Report: No change from baseline       Perception     Praxis      Pertinent Vitals/Pain Pain Assessment: No/denies pain     Hand Dominance Right   Extremity/Trunk Assessment Upper Extremity Assessment Upper Extremity Assessment: LUE deficits/detail LUE Deficits / Details: flexor spasticity noted.  but she demonstrates shoulder flexion to ~135*; full AROM of hand, but flexor spasms noted LUE Coordination: decreased gross motor;decreased fine motor   Lower Extremity Assessment Lower Extremity Assessment: Defer to PT evaluation   Cervical / Trunk Assessment Cervical / Trunk Assessment: Kyphotic   Communication Communication Communication: No difficulties   Cognition Arousal/Alertness: Awake/alert Behavior During Therapy: WFL for tasks assessed/performed Overall Cognitive Status: Within Functional Limits for tasks assessed  General Comments: grossly assessed   General Comments  Pt knows to ask MD for follow up OT if Lt UE remains with spasticity    Exercises     Shoulder Instructions      Home Living Family/patient expects to be discharged to:: Private residence Living Arrangements: Spouse/significant other Available Help at Discharge: Family;Available PRN/intermittently Type of Home: House Home Access: Other (comment) (Pt has a platform lift)     Home Layout: Two level;Able to live on main level with bedroom/bathroom Alternate Level Stairs-Number of Steps: full flight.  She  reports she has just started going up the stairs in the past year, or so after ongoing PT   Bathroom Shower/Tub: Other (comment) (wheel in shower)   Bathroom Toilet: Handicapped height Bathroom Accessibility: Yes How Accessible: Accessible via wheelchair;Accessible via walker Home Equipment: Grab bars - tub/shower;Grab bars - toilet;Walker - 4 wheels;Walker - 2 wheels;Wheelchair - Manufacturing systems engineer - built in   Additional Comments: spouse works during the day      Prior Functioning/Environment Level of Independence: Independent with assistive device(s)        Comments: Pt reports she uses a rollator for ambulation.        OT Problem List: Decreased strength;Decreased range of motion;Impaired balance (sitting and/or standing);Impaired tone;Impaired UE functional use      OT Treatment/Interventions:      OT Goals(Current goals can be found in the care plan section) Acute Rehab OT Goals Patient Stated Goal: to go home today OT Goal Formulation: All assessment and education complete, DC therapy  OT Frequency:     Barriers to D/C:            Co-evaluation              AM-PAC OT "6 Clicks" Daily Activity     Outcome Measure Help from another person eating meals?: None Help from another person taking care of personal grooming?: A Little Help from another person toileting, which includes using toliet, bedpan, or urinal?: A Little Help from another person bathing (including washing, rinsing, drying)?: A Little Help from another person to put on and taking off regular upper body clothing?: A Little Help from another person to put on and taking off regular lower body clothing?: A Little 6 Click Score: 19   End of Session Equipment Utilized During Treatment: Rolling walker Nurse Communication: Mobility status  Activity Tolerance: Patient tolerated treatment well Patient left: in bed;with call bell/phone within reach  OT Visit Diagnosis: Muscle weakness (generalized)  (M62.81)                Time: 5035-4656 OT Time Calculation (min): 18 min Charges:  OT General Charges $OT Visit: 1 Visit OT Evaluation $OT Eval Low Complexity: 1 Low  Eber Jones., OTR/L Acute Rehabilitation Services Pager [phone] Office [phone]   Jeani Hawking M 02/17/2021, 5:03 PM

## 2021-02-17 NOTE — Anesthesia Postprocedure Evaluation (Signed)
Anesthesia Post Note  Patient: Voncile Schwarz  Procedure(s) Performed: Baclofen Pump Replacement (Right: Abdomen)     Patient location during evaluation: PACU Anesthesia Type: General Level of consciousness: awake and alert Pain management: pain level controlled Vital Signs Assessment: post-procedure vital signs reviewed and stable Respiratory status: spontaneous breathing, nonlabored ventilation, respiratory function stable and patient connected to nasal cannula oxygen Cardiovascular status: blood pressure returned to baseline and stable Postop Assessment: no apparent nausea or vomiting Anesthetic complications: no   No notable events documented.  Last Vitals:  Vitals:   02/17/21 1356 02/17/21 1423  BP: 113/83 128/79  Pulse: 82 86  Resp: 11 18  Temp: 36.5 C 36.7 C  SpO2: 98% 100%    Last Pain:  Vitals:   02/17/21 1423  TempSrc: Oral  PainSc:                  Shelton Silvas

## 2021-02-17 NOTE — Plan of Care (Signed)
Patient alert and oriented, voiding adequately, skin clean, dry and intact without evidence of skin break down, or symptoms of complications - no redness or edema noted, only slight tenderness at site.  Patient states pain is manageable at time of discharge. Patient has an appointment with MD in 3 weeks 

## 2021-02-17 NOTE — Discharge Summary (Signed)
Physician Discharge Summary  Patient ID: Sharon Odom MRN: [id number] DOB/AGE: [birth year] 50 y.o.  Admit date: 02/17/2021 Discharge date: 02/17/2021  Admission Diagnoses:Multiple Sclerosis with spasticity  Discharge Diagnoses: Same Active Problems:   Multiple sclerosis [facility])   Discharged Condition: good  Hospital Course: Patient was admitted for baclofen pump revision.  The surgery went smoothly.  She was observed in hospital, given an additional dose of IV Abx, then discharged home.    Consults: None  Significant Diagnostic Studies: None  Treatments: surgery: baclofen pump revision  Discharge Exam: Blood pressure (!) 101/51, pulse 76, temperature (!) 97.1 F (36.2 C), resp. rate 12, height 5\' 9"  (1.753 m), weight 56.7 kg, SpO2 97 %. Neurologic: Motor: Stable  Disposition: Home  Discharge Instructions     Diet - low sodium heart healthy   Complete by: As directed    Diet general   Complete by: As directed    Incentive spirometry RT   Complete by: As directed    Increase activity slowly   Complete by: As directed       Allergies as of 02/17/2021       Reactions   Morphine Other (See Comments)   Pt does not recall reaction    Amoxicillin Nausea And Vomiting   Ziconotide Acetate    Anorexia, weird sensations, could not talk, choking feeling         Medication List     TAKE these medications    acetaminophen 500 MG tablet Commonly known as: TYLENOL Take 1,000 mg by mouth every 6 (six) hours as needed (pain).   albuterol 108 (90 Base) MCG/ACT inhaler Commonly known as: VENTOLIN HFA Inhale 1-2 puffs into the lungs every 6 (six) hours as needed for wheezing or shortness of breath.   baclofen 20 MG tablet Commonly known as: LIORESAL Take up to 6 pills daily for MS spasticity   Botox 100 units Solr injection Generic drug: botulinum toxin Type A INJECT UP TO 600 UNITS INTO MUSCLES OF LOWER EXTREMITIES EVERY THREE MONTHS. DISCARD UNUSED PORTION.    buPROPion 300 MG 24 hr tablet Commonly known as: WELLBUTRIN XL TAKE 1 TABLET DAILY   dalfampridine 10 MG Tb12 Take 1 tablet (10 mg total) by mouth 2 (two) times daily. One po bid   diphenhydramine-acetaminophen 25-500 MG Tabs tablet Commonly known as: TYLENOL PM Take 2 tablets by mouth at bedtime.   famotidine 10 MG tablet Commonly known as: PEPCID Take 10 mg by mouth daily.   ibuprofen 200 MG tablet Commonly known as: ADVIL Take 400 mg by mouth every 8 (eight) hours as needed for moderate pain.   Lemtrada 12 MG/1.2ML Soln Generic drug: Alemtuzumab Inject 12 mg into the vein See admin instructions. Once a year   montelukast 10 MG tablet Commonly known as: SINGULAIR TAKE 1 TABLET DAILY   naproxen sodium 220 MG tablet Commonly known as: ALEVE Take 440 mg by mouth 2 (two) times daily as needed (pain).   Oxycodone HCl 10 MG Tabs Take 1 tablet (10 mg total) by mouth every 4 (four) hours as needed.   polyethylene glycol 17 g packet Commonly known as: MIRALAX / GLYCOLAX Take 17 g by mouth daily as needed for moderate constipation.   senna-docusate 8.6-50 MG tablet Commonly known as: Senokot-S Take 1 tablet by mouth daily. What changed: how much to take   tiZANidine 4 MG tablet Commonly known as: Zanaflex Take 2 tablets (8 mg total) by mouth 3 (three) times daily.   trospium 20 MG  tablet Commonly known as: SANCTURA Take 20 mg by mouth 2 (two) times daily.   Vitamin B-12 2500 MCG Subl Place 1 tablet (2,500 mcg total) under the tongue daily.   Vitamin D-3 125 MCG (5000 UT) Tabs Take 5,000 Units by mouth daily.         Signed: Dorian Heckle, MD 02/17/2021, 12:57 PM

## 2021-02-17 NOTE — Brief Op Note (Signed)
02/17/2021  12:40 PM  PATIENT:  Sharon Odom  50 y.o. female  PRE-OPERATIVE DIAGNOSIS:  Multiple Sclerosis  POST-OPERATIVE DIAGNOSIS:  Multiple Sclerosis  PROCEDURE:  Procedure(s) with comments: Baclofen Pump Replacement (Right) - 3C/RM 21 (40 ml Synchromed with 2,000 mcg/cc baclofen)  SURGEON:  Surgeon(s) and Role:    Maeola Harman, MD - Primary  PHYSICIAN ASSISTANT:   ASSISTANTS: Poteat, RN   ANESTHESIA:   general  EBL: Minimal  BLOOD ADMINISTERED:none  DRAINS: none   LOCAL MEDICATIONS USED:  MARCAINE    and LIDOCAINE   SPECIMEN:  No Specimen  DISPOSITION OF SPECIMEN:  N/A  COUNTS:  YES  TOURNIQUET:  * No tourniquets in log *  DICTATION: Patient has implanted baclofen pump for spasticity secondary to multiple sclerosis, which is alarming.  It was elected for patient to undergo baclofen pump revision.  PROCEDURE: Patient was brought to the operating room and given intravenous sedation and LMA anesthesia.  Left lower quadrant was prepped with betadine scrub and Duraprep.  Area of planned incision was infiltrated with marcaine.  Prior incision was reopened and the old baclofen pump was externalized.  Catheter was disconnected. There was spontaneous flow of CSF.  The new baclofen pump was filled and connected to the catheter.  3, 2-0 silk sutures were placed in the pocket. The new pump was then internalized. Wound was irrigated with vancomycin. Then irrigated once more.  Incision was closed with 2-0 Vicryl and 3-0 vicryl sutures and dressed with a sterile occlusive dressing.  Counts were correct at the end of the case.   PLAN OF CARE: Admit to inpatient   PATIENT DISPOSITION:  PACU - hemodynamically stable.   Delay start of Pharmacological VTE agent (>24hrs) due to surgical blood loss or risk of bleeding: yes

## 2021-02-17 NOTE — Anesthesia Preprocedure Evaluation (Addendum)
Anesthesia Evaluation  Patient identified by MRN, date of birth, ID band Patient awake    Reviewed: Allergy & Precautions, NPO status , Patient's Chart, lab work & pertinent test results  Airway Mallampati: III  TM Distance: >3 FB Neck ROM: Full  Mouth opening: Limited Mouth Opening  Dental  (+) Teeth Intact, Dental Advisory Given   Pulmonary asthma , Current Smoker and Patient abstained from smoking.,    breath sounds clear to auscultation       Cardiovascular  Rhythm:Regular Rate:Normal     Neuro/Psych  Headaches, PSYCHIATRIC DISORDERS Anxiety Depression  Neuromuscular disease    GI/Hepatic Neg liver ROS, hiatal hernia, GERD  Medicated,  Endo/Other  negative endocrine ROS  Renal/GU negative Renal ROS     Musculoskeletal  (+) Arthritis , Osteoarthritis,    Abdominal Normal abdominal exam  (+)   Peds  Hematology   Anesthesia Other Findings   Reproductive/Obstetrics                            Anesthesia Physical Anesthesia Plan  ASA: 3  Anesthesia Plan: General   Post-op Pain Management:    Induction: Intravenous  PONV Risk Score and Plan: 3 and Ondansetron, Dexamethasone and Midazolam  Airway Management Planned: LMA  Additional Equipment: None  Intra-op Plan:   Post-operative Plan: Extubation in OR  Informed Consent: I have reviewed the patients History and Physical, chart, labs and discussed the procedure including the risks, benefits and alternatives for the proposed anesthesia with the patient or authorized representative who has indicated his/her understanding and acceptance.     Dental advisory given  Plan Discussed with: CRNA  Anesthesia Plan Comments: (Lab Results      Component                Value               Date                      WBC                      12.2 (H)            02/14/2021                HGB                      12.0                02/14/2021                 HCT                      38.3                02/14/2021                MCV                      104.6 (H)           02/14/2021                PLT                      278  02/14/2021           )       Anesthesia Quick Evaluation

## 2021-02-17 NOTE — Op Note (Signed)
02/17/2021  12:40 PM  PATIENT:  Sharon Odom  50 y.o. female  PRE-OPERATIVE DIAGNOSIS:  Multiple Sclerosis  POST-OPERATIVE DIAGNOSIS:  Multiple Sclerosis  PROCEDURE:  Procedure(s) with comments: Baclofen Pump Replacement (Right) - 3C/RM 21 (40 ml Synchromed with 2,000 mcg/cc baclofen)  SURGEON:  Surgeon(s) and Role:    * Nilah Belcourt, MD - Primary  PHYSICIAN ASSISTANT:   ASSISTANTS: Poteat, RN   ANESTHESIA:   general  EBL: Minimal  BLOOD ADMINISTERED:none  DRAINS: none   LOCAL MEDICATIONS USED:  MARCAINE    and LIDOCAINE   SPECIMEN:  No Specimen  DISPOSITION OF SPECIMEN:  N/A  COUNTS:  YES  TOURNIQUET:  * No tourniquets in log *  DICTATION: Patient has implanted baclofen pump for spasticity secondary to multiple sclerosis, which is alarming.  It was elected for patient to undergo baclofen pump revision.  PROCEDURE: Patient was brought to the operating room and given intravenous sedation and LMA anesthesia.  Left lower quadrant was prepped with betadine scrub and Duraprep.  Area of planned incision was infiltrated with marcaine.  Prior incision was reopened and the old baclofen pump was externalized.  Catheter was disconnected. There was spontaneous flow of CSF.  The new baclofen pump was filled and connected to the catheter.  3, 2-0 silk sutures were placed in the pocket. The new pump was then internalized. Wound was irrigated with vancomycin. Then irrigated once more.  Incision was closed with 2-0 Vicryl and 3-0 vicryl sutures and dressed with a sterile occlusive dressing.  Counts were correct at the end of the case.   PLAN OF CARE: Admit to inpatient   PATIENT DISPOSITION:  PACU - hemodynamically stable.   Delay start of Pharmacological VTE agent (>24hrs) due to surgical blood loss or risk of bleeding: yes  

## 2021-02-17 NOTE — Anesthesia Procedure Notes (Signed)
Procedure Name: LMA Insertion Date/Time: 02/17/2021 11:39 AM Performed by: De Nurse, CRNA Pre-anesthesia Checklist: Patient identified, Emergency Drugs available, Suction available and Patient being monitored Patient Re-evaluated:Patient Re-evaluated prior to induction Oxygen Delivery Method: Circle System Utilized Preoxygenation: Pre-oxygenation with 100% oxygen Induction Type: IV induction Ventilation: Mask ventilation without difficulty LMA: LMA inserted LMA Size: 4.0 Number of attempts: 1 Placement Confirmation: positive ETCO2 Tube secured with: Tape Dental Injury: Teeth and Oropharynx as per pre-operative assessment

## 2021-02-17 NOTE — Discharge Instructions (Signed)
Wound Care  You may shower. Do not scrub directly on incision.  Do not put any creams, lotions, or ointments on incision.  Diet Resume your normal diet.    Call Your Doctor If Any of These Occur Redness, drainage, or swelling at the wound.  Temperature greater than 101 degrees. Severe pain not relieved by pain medication. Incision starts to come apart.  Follow Up Appt Call  [phone])  for any problems

## 2021-02-17 NOTE — Progress Notes (Addendum)
Patient is doing well following baclofen pump revision.  We will admit to 3500, give one more dose of antibiotics and, if doing well, discharge after that.

## 2021-02-17 NOTE — Transfer of Care (Signed)
Immediate Anesthesia Transfer of Care Note  Patient: Sharon Odom  Procedure(s) Performed: Baclofen Pump Replacement (Right: Abdomen)  Patient Location: PACU  Anesthesia Type:General  Level of Consciousness: drowsy and patient cooperative  Airway & Oxygen Therapy: Patient Spontanous Breathing  Post-op Assessment: Report given to RN  Post vital signs: Reviewed and stable  Last Vitals:  Vitals Value Taken Time  BP 101/51 02/17/21 1241  Temp    Pulse 76 02/17/21 1242  Resp 9 02/17/21 1242  SpO2 97 % 02/17/21 1242  Vitals shown include unvalidated device data.  Last Pain:  Vitals:   02/17/21 0947  TempSrc:   PainSc: 4       Patients Stated Pain Goal: 1 (02/17/21 0947)  Complications: No notable events documented.

## 2021-02-17 NOTE — Evaluation (Signed)
Physical Therapy Evaluation Patient Details Name: Sharon Odom MRN: [id number] DOB: Jul 26, 1970 Today's Date: 02/17/2021  History of Present Illness  This 50 y.o. female admitted for [facility] pump replacement due to current pump malfunction.  PMH includes:  MS, seasonal asthma, arthritis  Clinical Impression  Patient presents with weakness, Lft>Rt, spasticity, dysarthric speech, impaired balance and impaired mobility s/p above. Pt lives at home with spouse and reports using rollator for ambulation. Limited with walking distances due to fatigue. Has been able to climb stairs for the first time in 3 years and has been working with HHPT on/off. Today, pt tolerated transfers and gait with Min guard for safety with use of rollator with a few standing rest breaks. Reports she is at functional baseline and therefore does not require skilled therapy services. Encouraged pt to get Bioness recalibrated to help manage LLE during mobility and see PT if spasticity does not improve or worsens. All education completed. Discharge from therapy.       Recommendations for follow up therapy are one component of a multi-disciplinary discharge planning process, led by the attending physician.  Recommendations may be updated based on patient status, additional functional criteria and insurance authorization.  Follow Up Recommendations No PT follow up;Supervision - Intermittent    Equipment Recommendations  None recommended by PT    Recommendations for Other Services       Precautions / Restrictions Precautions Precautions: Fall Precaution Comments: pt reports intermittent falls at home "not that often" Restrictions Weight Bearing Restrictions: No      Mobility  Bed Mobility Overal bed mobility: Modified Independent (has an adjustable bed)             General bed mobility comments: Sitting EOB upon PT arrival.    Transfers Overall transfer level: Needs assistance Equipment used: 4-wheeled  walker Transfers: Sit to/from Stand Sit to Stand: Min guard Stand pivot transfers: Min guard       General transfer comment: min guard for safety.  Pt reports she feels she is at her baseline.  Ambulation/Gait Ambulation/Gait assistance: Min guard Gait Distance (Feet): 80 Feet Assistive device: 4-wheeled walker Gait Pattern/deviations: Step-to pattern;Decreased step length - left;Decreased dorsiflexion - left;Step-through pattern Gait velocity: decreased   General Gait Details: SLow, mildly unsteady and spastic like gait with decreased foot clearance LLE and dragging LLE throughout gait.  Stairs            Wheelchair Mobility    Modified Rankin (Stroke Patients Only)       Balance Overall balance assessment: Needs assistance Sitting-balance support: Feet supported;No upper extremity supported Sitting balance-Leahy Scale: Good     Standing balance support: During functional activity Standing balance-Leahy Scale: Poor Standing balance comment: Requires UE support for dynamic tasks and walking                             Pertinent Vitals/Pain Pain Assessment: No/denies pain    Home Living Family/patient expects to be discharged to:: Private residence Living Arrangements: Spouse/significant other Available Help at Discharge: Family;Available PRN/intermittently Type of Home: House Home Access: Other (comment)     Home Layout: Two level;Able to live on main level with bedroom/bathroom Home Equipment: Grab bars - tub/shower;Grab bars - toilet;Walker - 4 wheels;Walker - 2 wheels;Wheelchair - Manufacturing systems engineer - built in Additional Comments: spouse works during the day    Prior Function Level of Independence: Independent with assistive device(s)  Comments: Pt reports she uses a rollator for ambulation.     Hand Dominance   Dominant Hand: Right    Extremity/Trunk Assessment   Upper Extremity Assessment Upper Extremity Assessment:  Defer to OT evaluation LUE Deficits / Details: flexor spasticity noted.  but she demonstrates shoulder flexion to ~135*; full AROM of hand, but flexor spasms noted LUE Coordination: decreased gross motor;decreased fine motor    Lower Extremity Assessment Lower Extremity Assessment: LLE deficits/detail LLE Deficits / Details: Spasticity noted throughout but able to extend into knee extension, decreased ankle DF/PF LLE Sensation: decreased light touch    Cervical / Trunk Assessment Cervical / Trunk Assessment: Kyphotic  Communication   Communication: No difficulties  Cognition Arousal/Alertness: Awake/alert Behavior During Therapy: WFL for tasks assessed/performed Overall Cognitive Status: Within Functional Limits for tasks assessed                                 General Comments: grossly assessed      General Comments General comments (skin integrity, edema, etc.): Encouraged pt to get bioness recalibrated to help with LLE during gait.    Exercises     Assessment/Plan    PT Assessment Patent does not need any further PT services  PT Problem List         PT Treatment Interventions      PT Goals (Current goals can be found in the Care Plan section)  Acute Rehab PT Goals Patient Stated Goal: to go home today PT Goal Formulation: All assessment and education complete, DC therapy    Frequency     Barriers to discharge        Co-evaluation               AM-PAC PT "6 Clicks" Mobility  Outcome Measure Help needed turning from your back to your side while in a flat bed without using bedrails?: A Little Help needed moving from lying on your back to sitting on the side of a flat bed without using bedrails?: A Little Help needed moving to and from a bed to a chair (including a wheelchair)?: A Little Help needed standing up from a chair using your arms (e.g., wheelchair or bedside chair)?: A Little Help needed to walk in hospital room?: A Little Help  needed climbing 3-5 steps with a railing? : A Little 6 Click Score: 18    End of Session   Activity Tolerance: Patient tolerated treatment well Patient left: in bed;with call bell/phone within reach (sitting EOB) Nurse Communication: Mobility status PT Visit Diagnosis: Difficulty in walking, not elsewhere classified (R26.2)    Time: 1025-8527 PT Time Calculation (min) (ACUTE ONLY): 24 min   Charges:   PT Evaluation $PT Eval Low Complexity: 1 Low PT Treatments $Gait Training: 8-22 mins        Vale Haven, PT, DPT Acute Rehabilitation Services Pager [phone] Office [phone]     Blake Divine A Lanier Ensign 02/17/2021, 5:26 PM

## 2021-02-20 ENCOUNTER — Encounter (HOSPITAL_COMMUNITY): Payer: Self-pay | Admitting: Neurosurgery

## 2021-03-03 ENCOUNTER — Other Ambulatory Visit: Payer: BLUE CROSS/BLUE SHIELD

## 2021-03-09 ENCOUNTER — Encounter: Payer: Self-pay | Admitting: Neurology

## 2021-03-16 ENCOUNTER — Ambulatory Visit (INDEPENDENT_AMBULATORY_CARE_PROVIDER_SITE_OTHER): Payer: BLUE CROSS/BLUE SHIELD | Admitting: Neurology

## 2021-03-16 ENCOUNTER — Encounter: Payer: Self-pay | Admitting: Neurology

## 2021-03-16 VITALS — BP 129/82 | HR 85 | Ht 69.0 in | Wt 128.0 lb

## 2021-03-16 DIAGNOSIS — Z79899 Other long term (current) drug therapy: Secondary | ICD-10-CM | POA: Diagnosis not present

## 2021-03-16 DIAGNOSIS — R269 Unspecified abnormalities of gait and mobility: Secondary | ICD-10-CM | POA: Diagnosis not present

## 2021-03-16 DIAGNOSIS — G35 Multiple sclerosis: Secondary | ICD-10-CM

## 2021-03-16 DIAGNOSIS — G8320 Monoplegia of upper limb affecting unspecified side: Secondary | ICD-10-CM

## 2021-03-16 DIAGNOSIS — G5632 Lesion of radial nerve, left upper limb: Secondary | ICD-10-CM | POA: Diagnosis not present

## 2021-03-16 MED ORDER — BACLOFEN 20 MG PO TABS: ORAL_TABLET | ORAL | 3 refills | Status: DC

## 2021-03-16 MED ORDER — DALFAMPRIDINE ER 10 MG PO TB12: 10.0000 mg | ORAL_TABLET | Freq: Two times a day (BID) | ORAL | 3 refills | Status: DC

## 2021-03-16 NOTE — Progress Notes (Signed)
[facility]  PATIENT: Sharon Odom DOB: 1971/04/16  REFERRING DOCTOR OR PCP:  Claretta Fraise SOURCE: Patient, notes imaging and lab reports, MRI images on CD from neurology,  _________________________________   HISTORICAL   CHIEF COMPLAINT:  Chief Complaint  Patient presents with   Follow-up    RM 2, w husband Onalee Hua. Here for 6 month MS f/u, on Lemtrada. Pt reports some worsening in sx. Pt had multiple SE from Ziconotide acetate. Pt reports she feels like she's on a boat.     HISTORY OF PRESENT ILLNESS: Sharon Odom is a 50 y.o. woman with active/relapsing secondary progressive multiple sclerosis, left greater than right spasticity and poor gait.  Update 03/16/2021: She has been on [place].   No recent exacerbation.    She is compliant with the REMS blood work every month.  She continues to experience a lot of left > right spasticity.   SHe notes it more in the left hand/arm than last year.   She is on tizanidine 4 mg, 6 pills a day. And baclofen 20 mg 4 times a day.       She has a baclofen pump and feels it has helped.   She can walk throughout the house with a walker.   She only uses a wheelchair outside the home.     She is on baclofen 6 x 20 mg baclofen daily and tizanidine 4 mg po qid.   She is on baclofen pump and gets refills in home health.   She reports spasticity is doing worse again.    She reports the Botox helped some.   She last saw Dr. Terrace Arabia in 07/12/2020 and reports the Botox 300 U was not done as she was not spastic in the arm at the time.  Last injection was June 2021.     She has spasticity in her feet but baclofen pump has overall helped.   She reports home health mildly reduced the baclofen pump dose.   Current setting is 869.8 mcg/day  She will be seeing Dr. Berline Chough at [facility].  Dr. Venetia Maxon replaced her pump recently.     Baclofen has helped the spasticity but she continues to have occasional tonic spasms that can be severe.  The more frequent tonic  spasms in the feet have resolved.  She notes mildly increased tone in the legs.  She is able to take a few steps with a walker.  She has difficulty lifting the left leg.  She was walking better last year.  Her husband notes that her voice has become clear but harder to understand over the last month as well.  She woke up March 02, 2020 with a left wrist drop and left arm pain and hand numbness.   NCV/EMG confirmed a radial palsy.   She spontaneously improved over 8-10 weeks.     Her last [name] was January 2021 (fourth course). She is compliant with the rems program.  She has not had any evidence of ITP or renal insufficiency.  She has occasional UTIs.   Labs 09/06/2020 were fine.     She had some delusions last year but these have no recurred.       Impression 04/19/2020: This NCV/EMG study shows the following: 1.   Severe subacute left radial neuropathy.  Voluntary motor units were noted in the EDC muscle. 2.   No evidence of significant radiculopathy.  MS history: She was diagnosed in October 2013 due to difficulty with her handwriting and right hand  coordination.   A few weeks later she had gait ataxia and numbness and was referred to MS.   She had an MRI of the brain (was in [place] at the time) and then had a LP confirming the diagnosis.  Initially, she was placed on Copaxone but had severe frequent exacerbations and then went to Tecfidera due to breakthrough exacerbations.  She was referred to [facility] for Tysabri (Feb 2014 to November 2016).  She then moved to [facility] and saw Dr. Ilene Qua [facility] [phone]).   She was always JCV Ab titer positive.    She did well on it but stopped 03/2015 due to safety risk.  She had more spasticity early 2017 that improved with 5 days IV Solumedrol and then started [facility] April 2017.   The infusion went well.    She started having more spasms in her legs later in 2017 and had Botox injections into her feet (some benefit but only for 3-4 weeks).    She had a second course of Botox November 2017 without much benefit.     She had numbness and tingling in her left arm November 2017 and was given an ESI.   Medical MJ did not help.   She had her second course of Lemtrada in April 2018 but the steroid did not help her spasticity as it did the first year.    She saw Dr. Gaynell Face at [facility] a couple times in 2018 and also saw Dr. Aquilla Hacker at [facility]. I started to see her in 2019.  A third course of Lemtrada was infused in October 2019.  A 4th course was infused January 2021.  Due to spasticity, she had Botox injections, initially with benefit. However, due to continued symptoms, a baclofen pump placed September 2020. She is followed at [facility] (Dr. Juanetta Gosling)  Imaging:  MRI of the brain 03/19/2017 shows multiple T2/FLAIR hyperintense foci in the cerebellum, pons, left greater than right thalamus and in the periventricular, juxtacortical and deep white matter of both hemispheres.      MRI of the brain 03/31/2019 was felt to be slightly progressed compared to her previous MRI in 2019.  However, by my interpretation the MRI is stable.  It shows T2/FLAIR hyperintense foci in the hemispheres, thalamus, pons and cerebellum consistent with MS  MRI of the cervical and thoracic spine 03/31/2019 showed a discrete lesion posteriorly to the left at C3-C4 and subtle patchy multifocal cord signal abnormalities in the cervical spine.  By my interpretation the MRI is stable compared to 2019.  The thoracic spinal cord was normal.  REVIEW OF SYSTEMS: Constitutional: No fevers, chills, sweats, or change in appetite.  She reports fatigue. Eyes: As above Ear, nose and throat: No hearing loss, ear pain, nasal congestion, sore throat Cardiovascular: No chest pain, palpitations Respiratory:  No shortness of breath at rest or with exertion.   No wheezes GastrointestinaI: No nausea, vomiting, diarrhea, abdominal pain, fecal  incontinence Genitourinary: As above  musculoskeletal: She has pain in her legs, left greater than right due to spasticity.  She notes mild back pain. Integumentary: No rash, pruritus, skin lesions Neurological: as above Psychiatric: As above Endocrine: No palpitations, diaphoresis, change in appetite, change in weigh or increased thirst Hematologic/Lymphatic:  No anemia, purpura, petechiae. Allergic/Immunologic: No itchy/runny eyes, nasal congestion, recent allergic reactions, rashes  ALLERGIES: Allergies  Allergen Reactions   Morphine Other (See Comments)    Pt does not recall reaction    Amoxicillin Nausea And Vomiting   Ziconotide  Acetate     Anorexia, weird sensations, could not talk, choking feeling, lost since of taste and smell. Hallucinations, vivid dreams. Confusion and sleepiness. N/v, increase in pain.     HOME MEDICATIONS:  Current Outpatient Medications:    acetaminophen (TYLENOL) 500 MG tablet, Take 1,000 mg by mouth every 6 (six) hours as needed (pain)., Disp: , Rfl:    albuterol (VENTOLIN HFA) 108 (90 Base) MCG/ACT inhaler, Inhale 1-2 puffs into the lungs every 6 (six) hours as needed for wheezing or shortness of breath., Disp: 6.7 g, Rfl: 1   Alemtuzumab (LEMTRADA) 12 MG/1.2ML SOLN, Inject 12 mg into the vein See admin instructions. Once a year, Disp: , Rfl:    BOTOX 100 units SOLR injection, INJECT UP TO 600 UNITS INTO MUSCLES OF LOWER EXTREMITIES EVERY THREE MONTHS. DISCARD UNUSED PORTION., Disp: 600 Units, Rfl: 0   buPROPion (WELLBUTRIN XL) 300 MG 24 hr tablet, TAKE 1 TABLET DAILY, Disp: 90 tablet, Rfl: 3   Cholecalciferol (VITAMIN D-3) 125 MCG (5000 UT) TABS, Take 5,000 Units by mouth daily., Disp: 30 tablet, Rfl: 1   Cyanocobalamin (VITAMIN B-12) 2500 MCG SUBL, Place 1 tablet (2,500 mcg total) under the tongue daily., Disp: 30 tablet, Rfl: 0   diphenhydramine-acetaminophen (TYLENOL PM) 25-500 MG TABS tablet, Take 2 tablets by mouth at bedtime., Disp: , Rfl:     ibuprofen (ADVIL) 200 MG tablet, Take 400 mg by mouth every 8 (eight) hours as needed for moderate pain., Disp: , Rfl:    montelukast (SINGULAIR) 10 MG tablet, TAKE 1 TABLET DAILY, Disp: 90 tablet, Rfl: 3   naproxen sodium (ALEVE) 220 MG tablet, Take 440 mg by mouth 2 (two) times daily as needed (pain)., Disp: , Rfl:    Oxycodone HCl 10 MG TABS, Take 1 tablet (10 mg total) by mouth every 4 (four) hours as needed., Disp: 120 tablet, Rfl: 0   polyethylene glycol (MIRALAX / GLYCOLAX) 17 g packet, Take 17 g by mouth daily as needed for moderate constipation., Disp: , Rfl:    raNITIdine HCl (ZANTAC PO), Take 10 mg by mouth daily., Disp: , Rfl:    senna-docusate (SENOKOT-S) 8.6-50 MG tablet, Take 1 tablet by mouth daily. (Patient taking differently: Take 3 tablets by mouth daily.), Disp: 30 tablet, Rfl: 0   tiZANidine (ZANAFLEX) 4 MG tablet, Take 2 tablets (8 mg total) by mouth 3 (three) times daily., Disp: 720 tablet, Rfl: 3   trospium (SANCTURA) 20 MG tablet, Take 20 mg by mouth 2 (two) times daily., Disp: , Rfl:    baclofen (LIORESAL) 20 MG tablet, Take up to 4 pills daily for MS spasticity, Disp: 360 each, Rfl: 3   dalfampridine 10 MG TB12, Take 1 tablet (10 mg total) by mouth 2 (two) times daily. One po bid, Disp: 180 tablet, Rfl: 3  PAST MEDICAL HISTORY: Past Medical History:  Diagnosis Date   Allergies    Anxiety    Arthritis    Asthma    Constipation    Depression    GERD (gastroesophageal reflux disease)    Headache    History of hiatal hernia    Left radial nerve palsy 03/15/2020   Multiple sclerosis (HCC)    Vision abnormalities    Wears glasses     PAST SURGICAL HISTORY:   FAMILY HISTORY: Family History  Problem Relation Age of Onset   Diabetes Mother    Healthy Brother     SOCIAL HISTORY:  Social History   Socioeconomic History   Marital status:  Married    Spouse name: Not on file   Number of children: Not on file   Years of education: Not on file   Highest  education level: Not on file  Occupational History   Not on file  Tobacco Use   Smoking status: Every Day    Packs/day: 0.50    Years: 32.00    Pack years: 16.00    Types: Cigarettes    Start date: 73   Smokeless tobacco: Never  Vaping Use   Vaping Use: Former  Substance and Sexual Activity   Alcohol use: Not Currently    Comment: 2-3 times per wk   Drug use: Never   Sexual activity: Not Currently  Other Topics Concern   Not on file  Social History Narrative   Right handed    Caffeine: 1 cup or less per day- coffee   [employer]   Social Determinants of Health   Financial Resource Strain: Not on file  Food Insecurity: Not on file  Transportation Needs: Not on file  Physical Activity: Not on file  Stress: Not on file  Social Connections: Not on file  Intimate Partner Violence: Not on file     PHYSICAL EXAM  Vitals:   03/16/21 1454  BP: 129/82  Pulse: 85  Weight: 128 lb (58.1 kg)  Height: 5\' 9"  (1.753 m)    Body mass index is 18.9 kg/m.   General: The patient is well-developed and well-nourished and in no acute distress  Neurologic Exam  Mental status: The patient is alert and oriented x 3 at the time of the examination. The patient has apparent normal recent and remote memory, with an apparently normal attention span and concentration ability.   Speech is normal.  Cranial nerves: Bilateral INO's, right greater than left.  Facial strength and sensation is normal.  The tongue is midline, and the patient has symmetric elevation of the soft palate. No obvious hearing deficits are noted.  Motor:   Spasticity is fairly resolved.  There were no flexion spasms.   Little strength was 0/5 in the radial innervated left arm muscles and 4/5 in the left triceps and other muscles in the arm.  The right arm has normal muscle tone and it was slightly increased proximally in the left arm.  Strength is 2+ to 3/5 in the left leg and 4- to 4/5 in the right leg .     Sensory:  Normal sensation to touch and vibration in the arms.  She has reduced sensation to vibration in the left leg.  Coordination: She has good right finger-nose-finger but reduced left finger-nose-finger.  HTS is poor on the right and she can't do on the left.    Gait and station: With support, she is able to stand and walk down the hallway with her walker.  She has difficulty lifting the left leg up.    Reflexes: Deep tendon reflexes are now 1+ at the knees.  No clonus at the ankles  25 foot timed walk was 20.6 seconds (average of 2) sing her walker.       ASSESSMENT AND PLAN     1. High risk medication use   2. Relapsing remitting multiple sclerosis (HCC)   3. Gait disturbance   4. 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  Continue REMS lab work until 2025.  She has had 4 courses of Lemtrada for her MS.  2.  Spasticity had done better with the baclofen pump.  She still reports spasticity and decreased use of the left hand.  She is walking better.  We will continue Ampyra and I sent in a refill..   3.   Continue baclofen and tizanidine pills.   4.   Follow-up in 6 months or sooner if there are new or worsening neurologic symptoms.   42-minute office visit with the majority of the time spent face-to-face for history and physical, discussion/counseling and decision-making.  Additional time with record review and documentation.  Shemeika Starzyk A. Epimenio Foot, MD, [facility] 03/16/2021, 5:35 PM Certified in Neurology, Clinical Neurophysiology, Sleep Medicine, Pain Medicine and Neuroimaging  [facility] [address] [place], [place] [address] [phone]

## 2021-03-17 ENCOUNTER — Telehealth: Payer: Self-pay

## 2021-03-17 ENCOUNTER — Encounter: Payer: BLUE CROSS/BLUE SHIELD | Admitting: Physical Medicine and Rehabilitation

## 2021-03-17 MED ORDER — OXYCODONE HCL 10 MG PO TABS: 10.0000 mg | ORAL_TABLET | ORAL | 0 refills | Status: DC | PRN

## 2021-03-17 NOTE — Telephone Encounter (Signed)
Patient has only 4 pills left. She had an appointment with Dr. Berline Chough today but was cancelled.  Will you send in refill.  PMP Last filled 12/16/20. Last written 12/16/20.

## 2021-03-17 NOTE — Telephone Encounter (Signed)
PMP was Reviewed. Last Oxycodone prescrition was filled on 07/22. Dr Berline Chough note was reviewed.  This provider placed a call to Dr Berline Chough regarding the above, Dr Berline Chough agrees with Oxycodone refill.  Placed a call to Ms. Ellers regarding the above, she verbalizes understanding.

## 2021-03-17 NOTE — Telephone Encounter (Signed)
Medication was already sent to pharmacy and Ms. Sharon Odom was notified.

## 2021-03-17 NOTE — Telephone Encounter (Signed)
In Dr. Dahlia Client absence can you refill Oxycodone for patient. Last filled #120 on 12/16/20 according to PMP. Patient was scheduled with Dr. Berline Chough today.

## 2021-03-30 ENCOUNTER — Telehealth: Payer: Self-pay

## 2021-03-30 NOTE — Telephone Encounter (Signed)
Sharon Odom called and stated she spoke with Odom yesterday and her Baclofen pump site was infected. She stated that Sharon Odom sent a MyChart message yesterday to inform Dr.Lovorn of Sharon infection. She advised Sharon Odom to call Sharon surgeon's office to get an appointment with them, and if they could not see her today to go to Sharon ER. Sharon Odom wanted Dr. Berline Chough to be aware of Sharon infection and for someone to take it serious.

## 2021-04-03 ENCOUNTER — Other Ambulatory Visit: Payer: Self-pay | Admitting: Neurological Surgery

## 2021-04-04 ENCOUNTER — Other Ambulatory Visit: Payer: Self-pay | Admitting: Neurological Surgery

## 2021-04-05 ENCOUNTER — Encounter (HOSPITAL_COMMUNITY): Payer: Self-pay | Admitting: Neurological Surgery

## 2021-04-05 ENCOUNTER — Other Ambulatory Visit: Payer: Self-pay

## 2021-04-05 NOTE — Progress Notes (Signed)
DUE TO COVID-19 ONLY ONE VISITOR IS ALLOWED TO COME WITH YOU AND STAY IN THE WAITING ROOM ONLY DURING PRE OP AND PROCEDURE DAY OF SURGERY.   TWO VISITORS  MAY VISIT WITH YOU AFTER SURGERY IN YOUR PRIVATE ROOM DURING VISITING HOURS ONLY!  PCP - Dr Pollie Friar Cardiologist - n/a Neurology - Dr Despina Arias  Chest x-ray - n/a EKG - 02/14/21 Stress Test - n/a ECHO - n/a Cardiac Cath - n/a  ICD Pacemaker/Loop - n/a  Sleep Study -  n/a CPAP - none  Diabetes - n/a  ERAS: Clear liquids til 12 Noon on DOS.  Clear liquids reviewed with patient.  Anesthesia review: Yes  STOP now taking any Aspirin (unless otherwise instructed by your surgeon), Aleve, Naproxen, Ibuprofen, Motrin, Advil, Goody's, BC's, all herbal medications, fish oil, and all vitamins.   Coronavirus Screening Covid test is scheduled on DOS Do you have any of the following symptoms:  Cough yes/no: No Fever (>100.52F)  yes/no: No Runny nose yes/no: No Sore throat yes/no: No Difficulty breathing/shortness of breath  Yes, but nothing new per patient  Have you traveled in the last 14 days and where? yes/no: No  Patient verbalized understanding of instructions that were given via phone.

## 2021-04-05 NOTE — Progress Notes (Signed)
Anesthesia Chart Review: SAME DAY WORK-UP  Case: [id number] Date/Time: 04/06/21 1445   Procedure: Removal of baclofen pump, Left (Left) - Lateral/Left//3C rm 19   Anesthesia type: General   Pre-op diagnosis: Muscle Spasticity   Location: [facility] ROOM 19 / [facility]   Surgeons: Dawley, Alan Mulder, DO       DISCUSSION: Patient is a 50 year old female scheduled for the above procedure. S/p IT pump implant 02/18/19. S/p Baclofen pump replacement on 02/17/21. She developed progressive redness and swelling around the pump site despite clindamycin, and above procedure recommended.   History includes smoking, relapsing remitting Multiple Sclerosis (diagnosed ~ 2013, history of diplopia, gait disturbance, spastic monoplegia LUE), left radial nerve palsy, GERD, hiatal hernia.    VS:  BP Readings from Last 3 Encounters:  03/16/21 129/82  02/17/21 (!) 120/94  02/14/21 135/78   Pulse Readings from Last 3 Encounters:  03/16/21 85  02/17/21 (!) 110  02/14/21 88     PROVIDERS: Annita Brod, MD is PCP  Despina Arias, MD is neurologist. Established care 08/15/17. Last visit 03/16/21. Six month follow-up planned. Lovorn, Aundra Millet, MD is PM&R   LABS: For day of surgery as indicated. As of 02/14/21, H/H 12.0/38.3, PLT 278, Cr 0.73, glucose 104, AST 19, ALT 19.   IMAGES: MRI Brain 02/02/21: IMPRESSION:  1.   There are T2/FLAIR hyperintense foci in the brainstem, cerebellum, thalamus and hemispheres in a pattern and configuration consistent with chronic demyelinating plaque associated with multiple sclerosis.  None of the foci enhances or appears to be acute.  Compared to the MRI dated 04/05/2020, there are no new lesions. 2.   No acute findings.  Normal enhancement pattern.  MRI C-spine 02/02/21:  IMPRESSION:  1.   Several T2 hyperintense foci within the spinal cord as detailed above.  None of these appear to be acute.  They were present on the 04/05/2020 MRI.  They are consistent with chronic demyelinating plaque  associated with multiple sclerosis. 2.   Mild multilevel degenerative changes as detailed above that does not lead to nerve root compression.  This is most notable at C5-C6 where there is right greater than left foraminal narrowing and borderline spinal stenosis but no nerve root compression. 3.   Normal enhancement pattern.   EKG: 02/14/21: NSR   CV: N/A  Past Medical History:  Diagnosis Date   Allergies    Anxiety    Arthritis    Asthma    Constipation    Depression    GERD (gastroesophageal reflux disease)    Headache    History of hiatal hernia    Left radial nerve palsy 03/15/2020   Multiple sclerosis (HCC)    Vision abnormalities    Wears glasses     Past Surgical History:  Procedure Laterality Date   EXCISION MORTON'S NEUROMA Right    fallopian tube removal     INTRATHECAL PUMP IMPLANT Right 02/18/2019   Procedure: INTRATHECAL PUMP IMPLANT;  Surgeon: Odette Fraction, MD;  Location: [facility] OR;  Service: Neurosurgery;  Laterality: Right;  INTRATHECAL PUMP IMPLANT   INTRATHECAL PUMP IMPLANT Right 02/17/2021   Procedure: Baclofen Pump Replacement;  Surgeon: Maeola Harman, MD;  Location: [facility] OR;  Service: Neurosurgery;  Laterality: Right;  3C/RM 21   WISDOM TOOTH EXTRACTION      MEDICATIONS: No current facility-administered medications for this encounter.    acetaminophen (TYLENOL) 500 MG tablet   albuterol (VENTOLIN HFA) 108 (90 Base) MCG/ACT inhaler   baclofen (LIORESAL) 20 MG tablet  buPROPion (WELLBUTRIN XL) 300 MG 24 hr tablet   carboxymethylcellulose (REFRESH PLUS) 0.5 % SOLN   Cholecalciferol (VITAMIN D-3) 125 MCG (5000 UT) TABS   clindamycin (CLEOCIN) 300 MG capsule   Cyanocobalamin (VITAMIN B-12) 2500 MCG SUBL   dalfampridine 10 MG TB12   diphenhydramine-acetaminophen (TYLENOL PM) 25-500 MG TABS tablet   montelukast (SINGULAIR) 10 MG tablet   Oxycodone HCl 10 MG TABS   oxymetazoline (AFRIN) 0.05 % nasal spray   polyethylene glycol (MIRALAX / GLYCOLAX) 17 g  packet   raNITIdine HCl (ZANTAC PO)   senna-docusate (SENOKOT-S) 8.6-50 MG tablet   tiZANidine (ZANAFLEX) 4 MG tablet   trospium (SANCTURA) 20 MG tablet   Wheat Dextrin (BENEFIBER) POWD   Alemtuzumab (LEMTRADA) 12 MG/1.2ML SOLN    Myra Gianotti, PA-C Surgical Short Stay/Anesthesiology [facility] Phone [phone] [facility] Phone [phone] 04/05/2021 12:35 PM

## 2021-04-05 NOTE — Anesthesia Preprocedure Evaluation (Addendum)
Anesthesia Evaluation  Patient identified by MRN, date of birth, ID band Patient awake    Reviewed: Allergy & Precautions, NPO status , Patient's Chart, lab work & pertinent test results  Airway Mallampati: II  TM Distance: >3 FB Neck ROM: Full    Dental  (+) Teeth Intact   Pulmonary asthma ,  COPD inhaler, Current Smoker and Patient abstained from smoking.,    Pulmonary exam normal        Cardiovascular negative cardio ROS   Rhythm:Regular Rate:Normal     Neuro/Psych  Headaches, Anxiety Depression MS  Neuromuscular disease    GI/Hepatic Neg liver ROS, GERD  ,  Endo/Other  negative endocrine ROS  Renal/GU negative Renal ROS  negative genitourinary   Musculoskeletal  (+) Arthritis , Osteoarthritis,    Abdominal Normal abdominal exam  (+)   Peds  Hematology negative hematology ROS (+)   Anesthesia Other Findings   Reproductive/Obstetrics                            Anesthesia Physical Anesthesia Plan  ASA: 3  Anesthesia Plan: General   Post-op Pain Management:    Induction: Intravenous  PONV Risk Score and Plan: 2 and Ondansetron, Dexamethasone, Midazolam and Treatment may vary due to age or medical condition  Airway Management Planned: Mask and LMA  Additional Equipment: None  Intra-op Plan:   Post-operative Plan: Extubation in OR  Informed Consent: I have reviewed the patients History and Physical, chart, labs and discussed the procedure including the risks, benefits and alternatives for the proposed anesthesia with the patient or authorized representative who has indicated his/her understanding and acceptance.     Dental advisory given  Plan Discussed with: CRNA  Anesthesia Plan Comments: (PAT note written 04/05/2021 by Shonna Chock, PA-C.  Lab Results      Component                Value               Date                      WBC                      8.0                  04/06/2021                HGB                      13.3                04/06/2021                HCT                      40.6                04/06/2021                MCV                      98.3                04/06/2021                PLT  317                 04/06/2021           Lab Results      Component                Value               Date                      NA                       138                 02/14/2021                K                        3.6                 02/14/2021                CO2                      25                  02/14/2021                GLUCOSE                  104 (H)             02/14/2021                BUN                      7                   02/14/2021                CREATININE               0.73                02/14/2021                CALCIUM                  9.2                 02/14/2021                GFRNONAA                 >60                 02/14/2021          )       Anesthesia Quick Evaluation

## 2021-04-06 ENCOUNTER — Encounter (HOSPITAL_COMMUNITY)
Admission: AD | Disposition: A | Discharge status: Home Health | Payer: Self-pay | Source: Home / Self Care | Attending: Neurological Surgery

## 2021-04-06 ENCOUNTER — Ambulatory Visit (HOSPITAL_COMMUNITY): Payer: BLUE CROSS/BLUE SHIELD | Admitting: Vascular Surgery

## 2021-04-06 ENCOUNTER — Encounter (HOSPITAL_COMMUNITY): Payer: Self-pay | Admitting: Neurological Surgery

## 2021-04-06 ENCOUNTER — Inpatient Hospital Stay (HOSPITAL_COMMUNITY)
Admission: AD | Admit: 2021-04-06 | Discharge: 2021-04-15 | DRG: 093 | Disposition: A | Discharge status: Home Health | Payer: BLUE CROSS/BLUE SHIELD | Attending: Neurological Surgery | Admitting: Neurological Surgery

## 2021-04-06 ENCOUNTER — Other Ambulatory Visit: Payer: Self-pay

## 2021-04-06 DIAGNOSIS — T85738A Infection and inflammatory reaction due to other nervous system device, implant or graft, initial encounter: Secondary | ICD-10-CM | POA: Diagnosis present

## 2021-04-06 DIAGNOSIS — G4700 Insomnia, unspecified: Secondary | ICD-10-CM | POA: Diagnosis present

## 2021-04-06 DIAGNOSIS — Z888 Allergy status to other drugs, medicaments and biological substances status: Secondary | ICD-10-CM

## 2021-04-06 DIAGNOSIS — F17210 Nicotine dependence, cigarettes, uncomplicated: Secondary | ICD-10-CM | POA: Diagnosis present

## 2021-04-06 DIAGNOSIS — F32A Depression, unspecified: Secondary | ICD-10-CM | POA: Diagnosis present

## 2021-04-06 DIAGNOSIS — Z881 Allergy status to other antibiotic agents status: Secondary | ICD-10-CM

## 2021-04-06 DIAGNOSIS — Z79899 Other long term (current) drug therapy: Secondary | ICD-10-CM | POA: Diagnosis not present

## 2021-04-06 DIAGNOSIS — M62838 Other muscle spasm: Secondary | ICD-10-CM | POA: Diagnosis present

## 2021-04-06 DIAGNOSIS — K219 Gastro-esophageal reflux disease without esophagitis: Secondary | ICD-10-CM | POA: Diagnosis present

## 2021-04-06 DIAGNOSIS — T8149XA Infection following a procedure, other surgical site, initial encounter: Secondary | ICD-10-CM

## 2021-04-06 DIAGNOSIS — R269 Unspecified abnormalities of gait and mobility: Secondary | ICD-10-CM

## 2021-04-06 DIAGNOSIS — R531 Weakness: Secondary | ICD-10-CM

## 2021-04-06 DIAGNOSIS — J45909 Unspecified asthma, uncomplicated: Secondary | ICD-10-CM | POA: Diagnosis present

## 2021-04-06 DIAGNOSIS — F419 Anxiety disorder, unspecified: Secondary | ICD-10-CM | POA: Diagnosis present

## 2021-04-06 DIAGNOSIS — Z20822 Contact with and (suspected) exposure to covid-19: Secondary | ICD-10-CM | POA: Diagnosis present

## 2021-04-06 DIAGNOSIS — G5632 Lesion of radial nerve, left upper limb: Secondary | ICD-10-CM | POA: Diagnosis present

## 2021-04-06 DIAGNOSIS — G35 Multiple sclerosis: Secondary | ICD-10-CM | POA: Diagnosis present

## 2021-04-06 DIAGNOSIS — Z885 Allergy status to narcotic agent status: Secondary | ICD-10-CM

## 2021-04-06 DIAGNOSIS — Y831 Surgical operation with implant of artificial internal device as the cause of abnormal reaction of the patient, or of later complication, without mention of misadventure at the time of the procedure: Secondary | ICD-10-CM | POA: Diagnosis present

## 2021-04-06 DIAGNOSIS — G8220 Paraplegia, unspecified: Secondary | ICD-10-CM

## 2021-04-06 HISTORY — PX: PAIN PUMP REMOVAL: SHX6391

## 2021-04-06 HISTORY — DX: Dyspnea, unspecified: R06.00

## 2021-04-06 LAB — CBC
HCT: 40.6 % (ref 36.0–46.0)
Hemoglobin: 13.3 g/dL (ref 12.0–15.0)
MCH: 32.2 pg (ref 26.0–34.0)
MCHC: 32.8 g/dL (ref 30.0–36.0)
MCV: 98.3 fL (ref 80.0–100.0)
Platelets: 317 10*3/uL (ref 150–400)
RBC: 4.13 MIL/uL (ref 3.87–5.11)
RDW: 11.9 % (ref 11.5–15.5)
WBC: 8 10*3/uL (ref 4.0–10.5)
nRBC: 0 % (ref 0.0–0.2)

## 2021-04-06 LAB — SARS CORONAVIRUS 2 BY RT PCR (HOSPITAL ORDER, PERFORMED IN ~~LOC~~ HOSPITAL LAB): SARS Coronavirus 2: NEGATIVE

## 2021-04-06 LAB — SURGICAL PCR SCREEN
MRSA, PCR: NEGATIVE
Staphylococcus aureus: NEGATIVE

## 2021-04-06 SURGERY — PAIN PUMP REMOVAL
Anesthesia: General | Laterality: Left

## 2021-04-06 MED ORDER — ONDANSETRON HCL 4 MG/2ML IJ SOLN
INTRAMUSCULAR | Status: DC | PRN
  Administered 2021-04-06: 4 mg via INTRAVENOUS

## 2021-04-06 MED ORDER — 0.9 % SODIUM CHLORIDE (POUR BTL) OPTIME
TOPICAL | Status: DC | PRN
  Administered 2021-04-06: 1000 mL

## 2021-04-06 MED ORDER — FENTANYL CITRATE (PF) 100 MCG/2ML IJ SOLN: 25.0000 ug | INTRAMUSCULAR | Status: DC | PRN

## 2021-04-06 MED ORDER — CHLORHEXIDINE GLUCONATE CLOTH 2 % EX PADS: 6.0000 | MEDICATED_PAD | Freq: Once | CUTANEOUS | Status: DC

## 2021-04-06 MED ORDER — POLYETHYLENE GLYCOL 3350 17 G PO PACK
17.0000 g | PACK | Freq: Every day | ORAL | Status: DC | PRN
  Administered 2021-04-10 – 2021-04-11 (×2): 17 g via ORAL
  Filled 2021-04-06 (×2): qty 1

## 2021-04-06 MED ORDER — BACITRACIN ZINC 500 UNIT/GM EX OINT
TOPICAL_OINTMENT | CUTANEOUS | Status: DC | PRN
  Administered 2021-04-06: 1 via TOPICAL

## 2021-04-06 MED ORDER — LACTATED RINGERS IV SOLN: INTRAVENOUS | Status: DC

## 2021-04-06 MED ORDER — SENNOSIDES-DOCUSATE SODIUM 8.6-50 MG PO TABS
3.0000 | ORAL_TABLET | Freq: Every day | ORAL | Status: DC
  Administered 2021-04-07 – 2021-04-15 (×9): 3 via ORAL
  Filled 2021-04-06 (×9): qty 3

## 2021-04-06 MED ORDER — MONTELUKAST SODIUM 10 MG PO TABS
10.0000 mg | ORAL_TABLET | Freq: Every day | ORAL | Status: DC
  Administered 2021-04-07 – 2021-04-15 (×9): 10 mg via ORAL
  Filled 2021-04-06 (×9): qty 1

## 2021-04-06 MED ORDER — DIAZEPAM 5 MG PO TABS
5.0000 mg | ORAL_TABLET | Freq: Three times a day (TID) | ORAL | Status: DC
  Administered 2021-04-07: 5 mg via ORAL
  Filled 2021-04-06: qty 1

## 2021-04-06 MED ORDER — ROCURONIUM BROMIDE 10 MG/ML (PF) SYRINGE
PREFILLED_SYRINGE | INTRAVENOUS | Status: AC
  Filled 2021-04-06: qty 20

## 2021-04-06 MED ORDER — ACETAMINOPHEN 10 MG/ML IV SOLN: 1000.0000 mg | Freq: Once | INTRAVENOUS | Status: DC | PRN

## 2021-04-06 MED ORDER — MENTHOL 3 MG MT LOZG: 1.0000 | LOZENGE | OROMUCOSAL | Status: DC | PRN

## 2021-04-06 MED ORDER — THROMBIN 5000 UNITS EX SOLR
CUTANEOUS | Status: AC
  Filled 2021-04-06: qty 5000

## 2021-04-06 MED ORDER — SODIUM CHLORIDE 0.9 % IV SOLN
10.0000 mg | INTRAVENOUS | Status: DC
  Administered 2021-04-07 – 2021-04-08 (×2): 10 mg via INTRAVENOUS
  Filled 2021-04-06 (×3): qty 1

## 2021-04-06 MED ORDER — CEFAZOLIN SODIUM-DEXTROSE 2-4 GM/100ML-% IV SOLN
INTRAVENOUS | Status: AC
  Filled 2021-04-06: qty 100

## 2021-04-06 MED ORDER — ACETAMINOPHEN 325 MG PO TABS
650.0000 mg | ORAL_TABLET | ORAL | Status: DC | PRN
  Administered 2021-04-07 – 2021-04-14 (×4): 650 mg via ORAL
  Filled 2021-04-06 (×4): qty 2

## 2021-04-06 MED ORDER — EPHEDRINE 5 MG/ML INJ
INTRAVENOUS | Status: AC
  Filled 2021-04-06: qty 5

## 2021-04-06 MED ORDER — FENTANYL CITRATE (PF) 250 MCG/5ML IJ SOLN
INTRAMUSCULAR | Status: AC
  Filled 2021-04-06: qty 5

## 2021-04-06 MED ORDER — CHLORHEXIDINE GLUCONATE 0.12 % MT SOLN
OROMUCOSAL | Status: AC
  Administered 2021-04-06: 15 mL via OROMUCOSAL
  Filled 2021-04-06: qty 15

## 2021-04-06 MED ORDER — OXYCODONE HCL 10 MG PO TABS: 10.0000 mg | ORAL_TABLET | ORAL | Status: DC | PRN

## 2021-04-06 MED ORDER — DEXAMETHASONE SODIUM PHOSPHATE 10 MG/ML IJ SOLN
INTRAMUSCULAR | Status: DC | PRN
  Administered 2021-04-06: 10 mg via INTRAVENOUS

## 2021-04-06 MED ORDER — ONDANSETRON HCL 4 MG PO TABS: 4.0000 mg | ORAL_TABLET | Freq: Four times a day (QID) | ORAL | Status: DC | PRN

## 2021-04-06 MED ORDER — FENTANYL CITRATE (PF) 250 MCG/5ML IJ SOLN
INTRAMUSCULAR | Status: DC | PRN
  Administered 2021-04-06: 50 ug via INTRAVENOUS
  Administered 2021-04-06: 100 ug via INTRAVENOUS
  Administered 2021-04-06: 50 ug via INTRAVENOUS

## 2021-04-06 MED ORDER — PROPOFOL 10 MG/ML IV BOLUS
INTRAVENOUS | Status: DC | PRN
  Administered 2021-04-06: 30 mg via INTRAVENOUS
  Administered 2021-04-06: 90 mg via INTRAVENOUS
  Administered 2021-04-06: 50 mg via INTRAVENOUS

## 2021-04-06 MED ORDER — OXYCODONE HCL 5 MG PO TABS
10.0000 mg | ORAL_TABLET | ORAL | Status: DC | PRN
  Administered 2021-04-07 – 2021-04-09 (×10): 10 mg via ORAL
  Filled 2021-04-06 (×10): qty 2

## 2021-04-06 MED ORDER — LIDOCAINE-EPINEPHRINE 1 %-1:100000 IJ SOLN
INTRAMUSCULAR | Status: AC
  Filled 2021-04-06: qty 1

## 2021-04-06 MED ORDER — OXYMETAZOLINE HCL 0.05 % NA SOLN: 1.0000 | Freq: Two times a day (BID) | NASAL | Status: AC | PRN

## 2021-04-06 MED ORDER — MIDAZOLAM HCL 2 MG/2ML IJ SOLN
INTRAMUSCULAR | Status: DC | PRN
  Administered 2021-04-06: 2 mg via INTRAVENOUS

## 2021-04-06 MED ORDER — THROMBIN 5000 UNITS EX SOLR
CUTANEOUS | Status: DC | PRN
  Administered 2021-04-06: 5000 [IU] via TOPICAL

## 2021-04-06 MED ORDER — BACLOFEN 20 MG PO TABS
40.0000 mg | ORAL_TABLET | Freq: Four times a day (QID) | ORAL | Status: DC
  Administered 2021-04-07 (×3): 40 mg via ORAL
  Filled 2021-04-06: qty 2
  Filled 2021-04-06: qty 4
  Filled 2021-04-06 (×3): qty 2
  Filled 2021-04-06 (×2): qty 4
  Filled 2021-04-06: qty 2

## 2021-04-06 MED ORDER — PHENYLEPHRINE 40 MCG/ML (10ML) SYRINGE FOR IV PUSH (FOR BLOOD PRESSURE SUPPORT)
PREFILLED_SYRINGE | INTRAVENOUS | Status: AC
  Filled 2021-04-06: qty 40

## 2021-04-06 MED ORDER — SODIUM CHLORIDE 0.9% FLUSH: 3.0000 mL | INTRAVENOUS | Status: DC | PRN

## 2021-04-06 MED ORDER — CYPROHEPTADINE HCL 4 MG PO TABS
4.0000 mg | ORAL_TABLET | Freq: Three times a day (TID) | ORAL | Status: DC
  Administered 2021-04-07 – 2021-04-15 (×26): 4 mg via ORAL
  Filled 2021-04-06 (×27): qty 1

## 2021-04-06 MED ORDER — ROCURONIUM BROMIDE 10 MG/ML (PF) SYRINGE
PREFILLED_SYRINGE | INTRAVENOUS | Status: DC | PRN
  Administered 2021-04-06: 40 mg via INTRAVENOUS

## 2021-04-06 MED ORDER — ONDANSETRON HCL 4 MG/2ML IJ SOLN
INTRAMUSCULAR | Status: AC
  Filled 2021-04-06: qty 2

## 2021-04-06 MED ORDER — ORAL CARE MOUTH RINSE: 15.0000 mL | Freq: Once | OROMUCOSAL | Status: AC

## 2021-04-06 MED ORDER — ALBUTEROL SULFATE (2.5 MG/3ML) 0.083% IN NEBU: 2.5000 mg | INHALATION_SOLUTION | Freq: Four times a day (QID) | RESPIRATORY_TRACT | Status: DC | PRN

## 2021-04-06 MED ORDER — CHLORHEXIDINE GLUCONATE CLOTH 2 % EX PADS
6.0000 | MEDICATED_PAD | Freq: Once | CUTANEOUS | Status: AC
  Administered 2021-04-06: 6 via TOPICAL

## 2021-04-06 MED ORDER — DALFAMPRIDINE ER 10 MG PO TB12
10.0000 mg | ORAL_TABLET | Freq: Two times a day (BID) | ORAL | Status: DC
  Administered 2021-04-09 – 2021-04-15 (×12): 10 mg via ORAL
  Filled 2021-04-06 (×13): qty 1

## 2021-04-06 MED ORDER — BUPIVACAINE HCL (PF) 0.5 % IJ SOLN
INTRAMUSCULAR | Status: AC
  Filled 2021-04-06: qty 30

## 2021-04-06 MED ORDER — SODIUM CHLORIDE 0.9 % IV SOLN
250.0000 mL | INTRAVENOUS | Status: DC
  Administered 2021-04-07 – 2021-04-14 (×4): 250 mL via INTRAVENOUS

## 2021-04-06 MED ORDER — POLYVINYL ALCOHOL 1.4 % OP SOLN: 1.0000 [drp] | Freq: Three times a day (TID) | OPHTHALMIC | Status: DC | PRN

## 2021-04-06 MED ORDER — MIDAZOLAM HCL 2 MG/2ML IJ SOLN
INTRAMUSCULAR | Status: AC
  Filled 2021-04-06: qty 2

## 2021-04-06 MED ORDER — SUCCINYLCHOLINE CHLORIDE 200 MG/10ML IV SOSY
PREFILLED_SYRINGE | INTRAVENOUS | Status: AC
  Filled 2021-04-06: qty 20

## 2021-04-06 MED ORDER — PHENOL 1.4 % MT LIQD
1.0000 | OROMUCOSAL | Status: DC | PRN
  Administered 2021-04-08: 1 via OROMUCOSAL
  Filled 2021-04-06: qty 177

## 2021-04-06 MED ORDER — HYDROMORPHONE HCL 1 MG/ML IJ SOLN
0.5000 mg | INTRAMUSCULAR | Status: DC | PRN
  Administered 2021-04-07 – 2021-04-10 (×6): 0.5 mg via INTRAVENOUS
  Filled 2021-04-06 (×7): qty 0.5

## 2021-04-06 MED ORDER — SUGAMMADEX SODIUM 200 MG/2ML IV SOLN
INTRAVENOUS | Status: DC | PRN
  Administered 2021-04-06: 150 mg via INTRAVENOUS

## 2021-04-06 MED ORDER — LIDOCAINE 2% (20 MG/ML) 5 ML SYRINGE
INTRAMUSCULAR | Status: DC | PRN
  Administered 2021-04-06: 40 mg via INTRAVENOUS

## 2021-04-06 MED ORDER — SODIUM CHLORIDE 0.9% FLUSH
3.0000 mL | Freq: Two times a day (BID) | INTRAVENOUS | Status: DC
  Administered 2021-04-07 – 2021-04-14 (×17): 3 mL via INTRAVENOUS

## 2021-04-06 MED ORDER — ACETAMINOPHEN 650 MG RE SUPP: 650.0000 mg | RECTAL | Status: DC | PRN

## 2021-04-06 MED ORDER — FENTANYL CITRATE (PF) 100 MCG/2ML IJ SOLN
INTRAMUSCULAR | Status: AC
  Filled 2021-04-06: qty 2

## 2021-04-06 MED ORDER — DARIFENACIN HYDROBROMIDE ER 7.5 MG PO TB24
7.5000 mg | ORAL_TABLET | Freq: Every day | ORAL | Status: DC
  Administered 2021-04-07 – 2021-04-15 (×9): 7.5 mg via ORAL
  Filled 2021-04-06 (×9): qty 1

## 2021-04-06 MED ORDER — VANCOMYCIN HCL 1000 MG IV SOLR
INTRAVENOUS | Status: AC
  Filled 2021-04-06: qty 20

## 2021-04-06 MED ORDER — CHLORHEXIDINE GLUCONATE 0.12 % MT SOLN: 15.0000 mL | Freq: Once | OROMUCOSAL | Status: AC

## 2021-04-06 MED ORDER — EPHEDRINE SULFATE-NACL 50-0.9 MG/10ML-% IV SOSY
PREFILLED_SYRINGE | INTRAVENOUS | Status: DC | PRN
  Administered 2021-04-06 (×2): 10 mg via INTRAVENOUS

## 2021-04-06 MED ORDER — PHENYLEPHRINE 40 MCG/ML (10ML) SYRINGE FOR IV PUSH (FOR BLOOD PRESSURE SUPPORT)
PREFILLED_SYRINGE | INTRAVENOUS | Status: DC | PRN
  Administered 2021-04-06 (×5): 80 ug via INTRAVENOUS

## 2021-04-06 MED ORDER — VANCOMYCIN HCL IN DEXTROSE 1-5 GM/200ML-% IV SOLN
1000.0000 mg | INTRAVENOUS | Status: AC
  Administered 2021-04-06: 1000 mg via INTRAVENOUS
  Filled 2021-04-06 (×2): qty 200

## 2021-04-06 MED ORDER — BUPROPION HCL ER (XL) 150 MG PO TB24
300.0000 mg | ORAL_TABLET | Freq: Every day | ORAL | Status: DC
  Administered 2021-04-07 – 2021-04-15 (×9): 300 mg via ORAL
  Filled 2021-04-06 (×9): qty 2

## 2021-04-06 MED ORDER — ONDANSETRON HCL 4 MG/2ML IJ SOLN
4.0000 mg | Freq: Four times a day (QID) | INTRAMUSCULAR | Status: DC | PRN
  Administered 2021-04-07: 4 mg via INTRAVENOUS
  Filled 2021-04-06: qty 2

## 2021-04-06 MED ORDER — LIDOCAINE 2% (20 MG/ML) 5 ML SYRINGE
INTRAMUSCULAR | Status: AC
  Filled 2021-04-06: qty 5

## 2021-04-06 MED ORDER — VANCOMYCIN HCL IN DEXTROSE 1-5 GM/200ML-% IV SOLN: 1000.0000 mg | INTRAVENOUS | Status: DC

## 2021-04-06 MED ORDER — BACITRACIN ZINC 500 UNIT/GM EX OINT
TOPICAL_OINTMENT | CUTANEOUS | Status: AC
  Filled 2021-04-06: qty 28.35

## 2021-04-06 SURGICAL SUPPLY — 72 items
BAG COUNTER SPONGE SURGICOUNT (BAG) ×2 IMPLANT
BLADE CLIPPER SURG (BLADE) ×2 IMPLANT
BLADE SURG 10 STRL SS (BLADE) ×4 IMPLANT
BLADE SURG 15 STRL LF DISP TIS (BLADE) ×1 IMPLANT
BLADE SURG 15 STRL SS (BLADE) ×1
BOOT SUTURE AID YELLOW STND (SUTURE) ×2 IMPLANT
CABLE BIPOLOR RESECTION CORD (MISCELLANEOUS) ×2 IMPLANT
CANISTER SUCT 3000ML PPV (MISCELLANEOUS) ×2 IMPLANT
CARTRIDGE OIL MAESTRO DRILL (MISCELLANEOUS) ×1 IMPLANT
CNTNR URN SCR LID CUP LEK RST (MISCELLANEOUS) ×1 IMPLANT
CONNECTOR 5 IN 1 STRAIGHT STRL (MISCELLANEOUS) ×2 IMPLANT
CONT SPEC 4OZ STRL OR WHT (MISCELLANEOUS) ×1
COVER MAYO STAND STRL (DRAPES) ×2 IMPLANT
DECANTER SPIKE VIAL GLASS SM (MISCELLANEOUS) ×2 IMPLANT
DERMABOND ADVANCED (GAUZE/BANDAGES/DRESSINGS) ×2
DERMABOND ADVANCED .7 DNX12 (GAUZE/BANDAGES/DRESSINGS) ×2 IMPLANT
DIFFUSER DRILL AIR PNEUMATIC (MISCELLANEOUS) ×2 IMPLANT
DRAPE C-ARM 42X72 X-RAY (DRAPES) ×2 IMPLANT
DRAPE INCISE IOBAN 85X60 (DRAPES) ×2 IMPLANT
DRAPE LAPAROTOMY 100X72X124 (DRAPES) ×2 IMPLANT
DRSG OPSITE POSTOP 3X4 (GAUZE/BANDAGES/DRESSINGS) ×2 IMPLANT
DRSG OPSITE POSTOP 4X6 (GAUZE/BANDAGES/DRESSINGS) ×2 IMPLANT
DRSG PAD ABDOMINAL 8X10 ST (GAUZE/BANDAGES/DRESSINGS) IMPLANT
ELECT COATED BLADE 2.86 ST (ELECTRODE) ×2 IMPLANT
ELECT REM PT RETURN 9FT ADLT (ELECTROSURGICAL) ×2
ELECTRODE REM PT RTRN 9FT ADLT (ELECTROSURGICAL) ×1 IMPLANT
GAUZE 4X4 16PLY ~~LOC~~+RFID DBL (SPONGE) ×2 IMPLANT
GAUZE SPONGE 4X4 12PLY STRL (GAUZE/BANDAGES/DRESSINGS) IMPLANT
GLOVE EXAM NITRILE XL STR (GLOVE) IMPLANT
GLOVE SRG 8 PF TXTR STRL LF DI (GLOVE) ×1 IMPLANT
GLOVE SURG ENC MOIS LTX SZ8 (GLOVE) ×2 IMPLANT
GLOVE SURG LTX SZ8 (GLOVE) ×2 IMPLANT
GLOVE SURG UNDER POLY LF SZ8 (GLOVE) ×1
GLOVE SURG UNDER POLY LF SZ8.5 (GLOVE) ×2 IMPLANT
GOWN STRL REUS W/ TWL LRG LVL3 (GOWN DISPOSABLE) IMPLANT
GOWN STRL REUS W/ TWL XL LVL3 (GOWN DISPOSABLE) IMPLANT
GOWN STRL REUS W/TWL 2XL LVL3 (GOWN DISPOSABLE) IMPLANT
GOWN STRL REUS W/TWL LRG LVL3 (GOWN DISPOSABLE)
GOWN STRL REUS W/TWL XL LVL3 (GOWN DISPOSABLE)
KIT BASIN OR (CUSTOM PROCEDURE TRAY) ×2 IMPLANT
KIT TURNOVER KIT B (KITS) ×2 IMPLANT
NEEDLE HYPO 18GX1.5 BLUNT FILL (NEEDLE) IMPLANT
NEEDLE HYPO 25X1 1.5 SAFETY (NEEDLE) ×2 IMPLANT
NS IRRIG 1000ML POUR BTL (IV SOLUTION) ×2 IMPLANT
OIL CARTRIDGE MAESTRO DRILL (MISCELLANEOUS) ×2
PACK EENT II TURBAN DRAPE (CUSTOM PROCEDURE TRAY) ×2 IMPLANT
PATTIES SURGICAL .5 X.5 (GAUZE/BANDAGES/DRESSINGS) IMPLANT
PATTIES SURGICAL 1X1 (DISPOSABLE) IMPLANT
PENCIL BUTTON HOLSTER BLD 10FT (ELECTRODE) ×2 IMPLANT
SEALANT ADHERUS EXTEND TIP (MISCELLANEOUS) ×2 IMPLANT
SET BERKELEY SUCTION TUBING (SUCTIONS) ×2 IMPLANT
SPONGE SURGIFOAM ABS GEL SZ50 (HEMOSTASIS) ×2 IMPLANT
SPONGE T-LAP 4X18 ~~LOC~~+RFID (SPONGE) ×2 IMPLANT
STAPLER SKIN PROX WIDE 3.9 (STAPLE) ×2 IMPLANT
SUT BONE WAX W31G (SUTURE) IMPLANT
SUT PROLENE 2 0 SH 30 (SUTURE) ×2 IMPLANT
SUT SILK 0 TIES 10X30 (SUTURE) ×2 IMPLANT
SUT SILK 2 0 PERMA HAND 18 BK (SUTURE) ×4 IMPLANT
SUT SILK 2 0 TIES 10X30 (SUTURE) IMPLANT
SUT VIC AB 0 CT1 18XCR BRD8 (SUTURE) ×2 IMPLANT
SUT VIC AB 0 CT1 8-18 (SUTURE) ×2
SUT VIC AB 2-0 CP2 18 (SUTURE) ×4 IMPLANT
SUT VIC AB 3-0 SH 8-18 (SUTURE) ×6 IMPLANT
SWAB COLLECTION DEVICE MRSA (MISCELLANEOUS) ×2 IMPLANT
SWAB CULTURE ESWAB REG 1ML (MISCELLANEOUS) ×2 IMPLANT
SYR 3ML LL SCALE MARK (SYRINGE) IMPLANT
SYR BULB IRRIG 60ML STRL (SYRINGE) ×2 IMPLANT
SYR CONTROL 10ML LL (SYRINGE) ×2 IMPLANT
TOWEL GREEN STERILE (TOWEL DISPOSABLE) ×2 IMPLANT
TOWEL GREEN STERILE FF (TOWEL DISPOSABLE) ×2 IMPLANT
TUBE CONNECTING 12X1/4 (SUCTIONS) ×2 IMPLANT
WATER STERILE IRR 1000ML POUR (IV SOLUTION) ×2 IMPLANT

## 2021-04-06 NOTE — Anesthesia Procedure Notes (Signed)
Procedure Name: Intubation Date/Time: 04/06/2021 8:58 PM Performed by: Molli Hazard, CRNA Pre-anesthesia Checklist: Patient identified, Emergency Drugs available, Suction available and Patient being monitored Patient Re-evaluated:Patient Re-evaluated prior to induction Oxygen Delivery Method: Circle system utilized Preoxygenation: Pre-oxygenation with 100% oxygen Induction Type: IV induction Ventilation: Mask ventilation without difficulty Laryngoscope Size: Miller and 2 Grade View: Grade I Tube type: Oral Tube size: 7.5 mm Number of attempts: 1 Airway Equipment and Method: Stylet Placement Confirmation: ETT inserted through vocal cords under direct vision, positive ETCO2 and breath sounds checked- equal and bilateral Secured at: 22 cm Tube secured with: Tape Dental Injury: Teeth and Oropharynx as per pre-operative assessment

## 2021-04-06 NOTE — Consult Note (Signed)
Pharmacy Antibiotic Note  Sharon Odom is a 50 y.o. female admitted on 04/06/2021 with cellulitis.  Pharmacy has been consulted for vancomycin dosing.  Pt is status post baclofen pump replacement by Dr. Venetia Maxon in September 2022. She began to notice reddening and swelling along her pump site with warmth to touch. She was placed on oral antibiotics.  The redness and swelling progressively worsened and there was some skin peeling but no drainage.  The incision appears well-healed. Status post baclofen pump removal on 04/07/21 due to infection. Given 1000 mg of vancomycin on 04/06/21 1858 for surgical ppx.  Pharmacy consulted for vancomycin dosing for wound infection. Will not give loading dose due to recent surgical ppx dose.  Scr used to calculate AUC dosing: 0.80  Plan: Vancomycin 750 mg IV q12h Expected AUC: 530 Monitor renal fx and vancomycin levels as needed  Height: 5\' 9"  (175.3 cm) Weight: 57.2 kg (126 lb) IBW/kg (Calculated) : 66.2  Temp (24hrs), Avg:98.1 F (36.7 C), Min:97.8 F (36.6 C), Max:98.5 F (36.9 C)  Recent Labs  Lab 04/06/21 1543 04/07/21 0339  WBC 8.0  --   CREATININE  --  0.73    Estimated Creatinine Clearance: 76.8 mL/min (by C-G formula based on SCr of 0.73 mg/dL).    Allergies  Allergen Reactions   Morphine Other (See Comments)    Pt does not recall reaction    Amoxicillin Nausea And Vomiting   Ziconotide Acetate     Anorexia, weird sensations, could not talk, choking feeling, lost since of taste and smell. Hallucinations, vivid dreams. Confusion and sleepiness. N/v, increase in pain.     Antimicrobials this admission: Vancomycin 11/10>>  Microbiology results: 11/10 Wound tissue cx: pending 11/10 MRSA PCR: negative 11/10 Staphylococcus aureus: negative  Thank you, 13/10 PharmD Candidate 04/07/2021 5:27 AM

## 2021-04-06 NOTE — Transfer of Care (Signed)
Immediate Anesthesia Transfer of Care Note  Patient: Sharon Odom  Procedure(s) Performed: Removal of baclofen pump, Left (Left)  Patient Location: PACU  Anesthesia Type:General  Level of Consciousness: awake, alert  and oriented  Airway & Oxygen Therapy: Patient connected to nasal cannula oxygen  Post-op Assessment: Report given to RN and Post -op Vital signs reviewed and stable  Post vital signs: Reviewed and stable  Last Vitals:  Vitals Value Taken Time  BP 130/68 04/06/21 2254  Temp    Pulse 96 04/06/21 2259  Resp 15 04/06/21 2259  SpO2 98 % 04/06/21 2259  Vitals shown include unvalidated device data.  Last Pain:  Vitals:   04/06/21 1526  TempSrc: Oral  PainSc: 0-No pain         Complications: No notable events documented.

## 2021-04-06 NOTE — H&P (Signed)
Providing Compassionate, Quality Care - Together  NEUROSURGERY HISTORY & PHYSICAL   Sharon Odom is an 50 y.o. female.   Chief Complaint: Wound infection HPI: This is a 50 year old female with a history of MS, status post baclofen pump replacement by Dr. Venetia Maxon in September 2022.  She began to notice reddening and swelling along her pump site with warmth to touch.  She was placed on oral antibiotics.  The redness and swelling progressively worsened and there was some skin peeling but no drainage.  The incision appears well-healed.  She denies fevers.  She presents today for baclofen pump removal.  Past Medical History:  Diagnosis Date   Allergies    Anxiety    Arthritis    Asthma    seasonal - pollen   Constipation    Depression    Dyspnea    with exertion   GERD (gastroesophageal reflux disease)    Headache    History of hiatal hernia    Left radial nerve palsy 03/15/2020   resolved - Left arm/hand weak   Multiple sclerosis (HCC)    Pneumonia 2007   x 1   Vision abnormalities    Wears glasses     Past Surgical History:  Procedure Laterality Date   EXCISION MORTON'S NEUROMA Right    fallopian tube removal     INTRATHECAL PUMP IMPLANT Right 02/18/2019   Procedure: INTRATHECAL PUMP IMPLANT;  Surgeon: Odette Fraction, MD;  Location: [facility] OR;  Service: Neurosurgery;  Laterality: Right;  INTRATHECAL PUMP IMPLANT   INTRATHECAL PUMP IMPLANT Right 02/17/2021   Procedure: Baclofen Pump Replacement;  Surgeon: Maeola Harman, MD;  Location[facility] OR;  Service: Neurosurgery;  Laterality: Right;  3C/RM 21   UPPER GI ENDOSCOPY     WISDOM TOOTH EXTRACTION     WISDOM TOOTH EXTRACTION      Family History  Problem Relation Age of Onset   Diabetes Mother    Healthy Brother    Social History:  reports that she has been smoking cigarettes. She started smoking about 34 years ago. She has a 16.00 pack-year smoking history. She has never used smokeless tobacco. She reports that she does not  currently use alcohol. She reports that she does not use drugs.  Allergies:  Allergies  Allergen Reactions   Morphine Other (See Comments)    Pt does not recall reaction    Amoxicillin Nausea And Vomiting   Ziconotide Acetate     Anorexia, weird sensations, could not talk, choking feeling, lost since of taste and smell. Hallucinations, vivid dreams. Confusion and sleepiness. N/v, increase in pain.     Medications Prior to Admission  Medication Sig Dispense Refill   acetaminophen (TYLENOL) 500 MG tablet Take 1,000 mg by mouth every 6 (six) hours as needed (pain).     baclofen (LIORESAL) 20 MG tablet Take up to 4 pills daily for MS spasticity (Patient taking differently: Take 40 mg by mouth 2 (two) times daily.) 360 each 3   buPROPion (WELLBUTRIN XL) 300 MG 24 hr tablet TAKE 1 TABLET DAILY 90 tablet 3   carboxymethylcellulose (REFRESH PLUS) 0.5 % SOLN Place 1 drop into both eyes 3 (three) times daily as needed (dry eyes).     Cholecalciferol (VITAMIN D-3) 125 MCG (5000 UT) TABS Take 5,000 Units by mouth daily. 30 tablet 1   clindamycin (CLEOCIN) 300 MG capsule Take 300 mg by mouth every 6 (six) hours.     Cyanocobalamin (VITAMIN B-12) 2500 MCG SUBL Place 1 tablet (2,500  mcg total) under the tongue daily. 30 tablet 0   dalfampridine 10 MG TB12 Take 1 tablet (10 mg total) by mouth 2 (two) times daily. One po bid 180 tablet 3   diphenhydramine-acetaminophen (TYLENOL PM) 25-500 MG TABS tablet Take 2 tablets by mouth at bedtime.     montelukast (SINGULAIR) 10 MG tablet TAKE 1 TABLET DAILY 90 tablet 3   Oxycodone HCl 10 MG TABS Take 1 tablet (10 mg total) by mouth every 4 (four) hours as needed. 120 tablet 0   oxymetazoline (AFRIN) 0.05 % nasal spray Place 1 spray into both nostrils 2 (two) times daily as needed for congestion.     polyethylene glycol (MIRALAX / GLYCOLAX) 17 g packet Take 17 g by mouth daily as needed for moderate constipation.     raNITIdine HCl (ZANTAC PO) Take 10 mg by mouth  daily.     senna-docusate (SENOKOT-S) 8.6-50 MG tablet Take 1 tablet by mouth daily. (Patient taking differently: Take 3 tablets by mouth daily.) 30 tablet 0   tiZANidine (ZANAFLEX) 4 MG tablet Take 2 tablets (8 mg total) by mouth 3 (three) times daily. 720 tablet 3   trospium (SANCTURA) 20 MG tablet Take 20 mg by mouth 2 (two) times daily.     Wheat Dextrin (BENEFIBER) POWD Take 3.5 Scoops by mouth 2 (two) times daily.     albuterol (VENTOLIN HFA) 108 (90 Base) MCG/ACT inhaler Inhale 1-2 puffs into the lungs every 6 (six) hours as needed for wheezing or shortness of breath. 6.7 g 1   Alemtuzumab (LEMTRADA) 12 MG/1.2ML SOLN Inject 12 mg into the vein See admin instructions. Once a year      Results for orders placed or performed during the hospital encounter of 04/06/21 (from the past 48 hour(s))  SARS Coronavirus 2 by RT PCR (hospital order, performed in [facility] hospital lab) Nasopharyngeal Nasopharyngeal Swab     Status: None   Collection Time: 04/06/21  3:18 PM   Specimen: Nasopharyngeal Swab  Result Value Ref Range   SARS Coronavirus 2 NEGATIVE NEGATIVE    Comment: (NOTE) SARS-CoV-2 target nucleic acids are NOT DETECTED.  The SARS-CoV-2 RNA is generally detectable in upper and lower respiratory specimens during the acute phase of infection. The lowest concentration of SARS-CoV-2 viral copies this assay can detect is 250 copies / mL. A negative result does not preclude SARS-CoV-2 infection and should not be used as the sole basis for treatment or other patient management decisions.  A negative result may occur with improper specimen collection / handling, submission of specimen other than nasopharyngeal swab, presence of viral mutation(s) within the areas targeted by this assay, and inadequate number of viral copies (<250 copies / mL). A negative result must be combined with clinical observations, patient history, and epidemiological information.  Fact Sheet for Patients:    BoilerBrush.com.cy  Fact Sheet for Healthcare Providers: https://pope.com/  This test is not yet approved or  cleared by the [place] FDA and has been authorized for detection and/or diagnosis of SARS-CoV-2 by FDA under an Emergency Use Authorization (EUA).  This EUA will remain in effect (meaning this test can be used) for the duration of the COVID-19 declaration under Section 564(b)(1) of the Act, 21 U.S.C. section 360bbb-3(b)(1), unless the authorization is terminated or revoked sooner.  Performed at [facility], [address] [place], [place] [address]   Surgical pcr screen     Status: None   Collection Time: 04/06/21  3:43 PM  Specimen: Nasal Mucosa; Nasal Swab  Result Value Ref Range   MRSA, PCR NEGATIVE NEGATIVE   Staphylococcus aureus NEGATIVE NEGATIVE    Comment: (NOTE) The Xpert SA Assay (FDA approved for NASAL specimens in patients 29 years of age and older), is one component of a comprehensive surveillance program. It is not intended to diagnose infection nor to guide or monitor treatment. Performed at [facility], [address], [place], [place] [address]   CBC     Status: None   Collection Time: 04/06/21  3:43 PM  Result Value Ref Range   WBC 8.0 4.0 - 10.5 K/uL   RBC 4.13 3.87 - 5.11 MIL/uL   Hemoglobin 13.3 12.0 - 15.0 g/dL   HCT 69.4 85.4 - 62.7 %   MCV 98.3 80.0 - 100.0 fL   MCH 32.2 26.0 - 34.0 pg   MCHC 32.8 30.0 - 36.0 g/dL   RDW 03.5 00.9 - 38.1 %   Platelets 317 150 - 400 K/uL   nRBC 0.0 0.0 - 0.2 %    Comment: Performed at [facility], [address], [place], [place] [address]   No results found.  ROS All positives and negatives are listed in HPI above  Blood pressure (!) 159/97, pulse 100, temperature 97.8 F (36.6 C), temperature source Oral, resp. rate 19, height 5\' 9"  (1.753 m), weight 57.2 kg, SpO2 100 %. Physical Exam  Awake alert oriented x3 PERRLA Face  symmetric EOMI Moves all extremities equally, diffusely weak Left abdominal incision has significant redness and warmth to touch and tenderness. Lumbar incision well-healed.   Assessment/Plan 50 year old female with  MS with spasticity Baclofen pump infection   -OR today for removal of baclofen pump due to infection.  We discussed all risks, benefits and expected outcomes including CSF leak, further infection, need for further surgery, baclofen withdraw.  She agrees to proceed with surgical intervention.   She will be admitted for close observation for baclofen withdrawal.  Per Dr. 54 she will be on 40 mg 3 times daily of baclofen, Valium 5 mg 3-4 times per day, Periactin 4 mg 3-4 times per day to minimize/prevent withdrawal.  In the event of concern for withdrawal please call Dr. Berline Chough at [phone] or page the on-call neurosurgeon immediately.     Thank you for allowing me to participate in this patient's care.  Please do not hesitate to call with questions or concerns.   [phone], DO Neurosurgeon [facility] Cell: [phone]

## 2021-04-06 NOTE — Op Note (Signed)
   Providing Compassionate, Quality Care - Together  Date of service: 04/06/2021  PREOP DIAGNOSIS:  Multiple sclerosis Infected baclofen pump  POSTOP DIAGNOSIS: Same  PROCEDURE: Removal of baclofen pump, left lower quadrant  SURGEON: Dr. Kendell Bane C. Keyaan Lederman, DO  ASSISTANT: Docia Barrier, NP  ANESTHESIA: General Endotracheal  EBL: 50 cc  SPECIMENS: Abdominal wound cultures and pocket tissue culture  DRAINS: None  COMPLICATIONS: None  CONDITION: Hemodynamically stable  HISTORY: Sharon Odom is a 50 y.o. female with a history of MS that had a baclofen pump replacement in September 2022 and began having swelling, erythema, peeling of her skin and warm to touch a few weeks ago.  She was placed on oral antibiotics but did not help significantly and the swelling began to progress.  Therefore with concern for infection, I recommended removal of the baclofen pump.  We discussed all risks, benefits and expected outcomes as well as risks of baclofen withdrawal.  She agreed to proceed with surgical intervention.  Informed consent was obtained.  PROCEDURE IN DETAIL: The patient was brought to the operating room. After induction of general anesthesia, the patient was positioned on the operative table in the right lateral decubitus position. All pressure points were meticulously padded. Skin incision was then marked out and prepped and draped in the usual sterile fashion over the left abdomen and lumbar region.  Physician driven timeout was performed.  The left lower quadrant incision was opened sharply with a 10 blade.  Immediately there was purulent fluid identified.  This was cultured.  The baclofen pump was removed from the pocket and there is ligamentous material surrounding the pump.  This was sent for tissue culture.  The pump was disconnected from the catheter.  The pump was discarded.  The catheter was freed from all of its adhesions.  It was disconnected from the lumbar portion.  The pocket  was debrided sharply, hemostasis was achieved with monopolar cautery.  The lumbar incision was opened sharply with a 10 blade.  Using Bovie electrocautery, the lumbar catheter was identified as well as the anchor.  This was removed without complication.  The fascia where the lumbar catheter was inserted was closed with a 2-0 Prolene suture in a pursestring fashion while injecting adherus glue.  The entire catheter was removed from the spine and abdomen.  The lumbar wound was then closed in layers with 2-0 Vicryl sutures for fascia and dermis.  The skin was closed with 3-0 nylon running simple suture.  Sterile dressing was applied.  The wound was monitored for series of minutes and noted to be excellently hemostatic.  The abdominal incision was then closed with 2-0 Vicryl sutures to close the dead space.  The dermis was closed with 2-0 Vicryl sutures.  The skin was closed with staples.  Sterile dressing was applied.  At the end of the case all sponge, needle, and instrument counts were correct. The patient was then transferred to the stretcher, extubated, and taken to the post-anesthesia care unit in stable hemodynamic condition.

## 2021-04-07 ENCOUNTER — Encounter (HOSPITAL_COMMUNITY): Payer: Self-pay | Admitting: Neurological Surgery

## 2021-04-07 LAB — BASIC METABOLIC PANEL
Anion gap: 11 (ref 5–15)
BUN: 8 mg/dL (ref 6–20)
CO2: 24 mmol/L (ref 22–32)
Calcium: 9 mg/dL (ref 8.9–10.3)
Chloride: 103 mmol/L (ref 98–111)
Creatinine, Ser: 0.73 mg/dL (ref 0.44–1.00)
GFR, Estimated: >60 mL/min (ref >60)
Glucose, Bld: 138 mg/dL — ABNORMAL HIGH (ref 70–99)
Potassium: 4.2 mmol/L (ref 3.5–5.1)
Sodium: 138 mmol/L (ref 135–145)

## 2021-04-07 MED ORDER — TIZANIDINE HCL 4 MG PO TABS
4.0000 mg | ORAL_TABLET | Freq: Two times a day (BID) | ORAL | Status: DC
  Administered 2021-04-07 – 2021-04-08 (×2): 4 mg via ORAL
  Filled 2021-04-07 (×2): qty 1

## 2021-04-07 MED ORDER — ZOLPIDEM TARTRATE 5 MG PO TABS
5.0000 mg | ORAL_TABLET | Freq: Every evening | ORAL | Status: DC | PRN
  Administered 2021-04-07: 5 mg via ORAL
  Filled 2021-04-07: qty 1

## 2021-04-07 MED ORDER — VANCOMYCIN HCL 750 MG/150ML IV SOLN
750.0000 mg | INTRAVENOUS | Status: AC
  Administered 2021-04-07: 750 mg via INTRAVENOUS
  Filled 2021-04-07: qty 150

## 2021-04-07 MED ORDER — SODIUM CHLORIDE 0.9 % IV SOLN
10.0000 mg | Freq: Once | INTRAVENOUS | Status: AC
  Administered 2021-04-07: 10 mg via INTRAVENOUS
  Filled 2021-04-07: qty 1

## 2021-04-07 MED ORDER — BACLOFEN 10 MG PO TABS
40.0000 mg | ORAL_TABLET | Freq: Four times a day (QID) | ORAL | Status: DC
  Administered 2021-04-07 – 2021-04-15 (×30): 40 mg via ORAL
  Filled 2021-04-07 (×29): qty 4

## 2021-04-07 MED ORDER — DIAZEPAM 5 MG PO TABS
5.0000 mg | ORAL_TABLET | Freq: Three times a day (TID) | ORAL | Status: DC
  Administered 2021-04-07 – 2021-04-15 (×25): 5 mg via ORAL
  Filled 2021-04-07 (×25): qty 1

## 2021-04-07 MED ORDER — ALUM & MAG HYDROXIDE-SIMETH 200-200-20 MG/5ML PO SUSP
30.0000 mL | Freq: Four times a day (QID) | ORAL | Status: DC | PRN
  Filled 2021-04-07: qty 30

## 2021-04-07 MED ORDER — VANCOMYCIN HCL 750 MG/150ML IV SOLN
750.0000 mg | Freq: Two times a day (BID) | INTRAVENOUS | Status: DC
  Administered 2021-04-07 – 2021-04-09 (×4): 750 mg via INTRAVENOUS
  Filled 2021-04-07 (×5): qty 150

## 2021-04-07 MED ORDER — ZOLPIDEM TARTRATE 5 MG PO TABS
10.0000 mg | ORAL_TABLET | Freq: Every evening | ORAL | Status: DC | PRN
  Administered 2021-04-10 – 2021-04-14 (×4): 10 mg via ORAL
  Filled 2021-04-07 (×4): qty 2

## 2021-04-07 MED ORDER — BACLOFEN 10 MG PO TABS
40.0000 mg | ORAL_TABLET | Freq: Three times a day (TID) | ORAL | Status: DC
  Filled 2021-04-07: qty 4

## 2021-04-07 NOTE — Progress Notes (Signed)
Patient stated last time she voided was at noon. Bladder scan showed 998 at 0300. In & Out patient and obtained 1450 of urine. Patient stated she was experiencing urinary retention before anesthesia and believes it was caused from taking vancomycin.

## 2021-04-07 NOTE — Anesthesia Postprocedure Evaluation (Signed)
Anesthesia Post Note  Patient: Raha Tennison  Procedure(s) Performed: Removal of baclofen pump, Left (Left)     Patient location during evaluation: PACU Anesthesia Type: General Level of consciousness: awake and alert Pain management: pain level controlled Vital Signs Assessment: post-procedure vital signs reviewed and stable Respiratory status: spontaneous breathing, nonlabored ventilation, respiratory function stable and patient connected to nasal cannula oxygen Cardiovascular status: blood pressure returned to baseline and stable Postop Assessment: no apparent nausea or vomiting Anesthetic complications: no   No notable events documented.  Last Vitals:  Vitals:   04/06/21 2310 04/06/21 2325  BP: 123/68 135/83  Pulse: 91 95  Resp: 17 15  Temp:    SpO2: 99% 97%    Last Pain:  Vitals:   04/06/21 2347  TempSrc:   PainSc: 10-Worst pain ever                 Nelle Don Michiah Mudry

## 2021-04-07 NOTE — Evaluation (Signed)
Physical Therapy Evaluation Patient Details Name: Sharon Odom MRN: [id number] DOB: 06/21/1970 Today's Date: 04/07/2021  History of Present Illness  50 yo female admitted for bacolfen pump removal 04/06/21 PMH MS arthritis.  Clinical Impression  Patient presents with decreased mobility due to above procedure with pain and increased LE spasticity/tone.  She reports using rollator at home and spouse normally works.  Patient currently minguard assist overall for in room and hallway ambulation.  She will benefit from skilled PT in the acute setting to maximize mobility and safety prior to d/c home.  Recommend return to outpatient PT for gait training, pt hopeful to use her Bioness for the L LE again now that she is without the pump.      Recommendations for follow up therapy are one component of a multi-disciplinary discharge planning process, led by the attending physician.  Recommendations may be updated based on patient status, additional functional criteria and insurance authorization.  Follow Up Recommendations Outpatient PT    Assistance Recommended at Discharge Intermittent Supervision/Assistance  Functional Status Assessment Patient has had a recent decline in their functional status and demonstrates the ability to make significant improvements in function in a reasonable and predictable amount of time.  Equipment Recommendations  None recommended by PT    Recommendations for Other Services       Precautions / Restrictions Precautions Precautions: Fall      Mobility  Bed Mobility               General bed mobility comments: on BSC with NT in the room upon my entry    Transfers Overall transfer level: Needs assistance Equipment used: Rollator (4 wheels);Rolling walker (2 wheels) Transfers: Sit to/from Stand;Bed to chair/wheelchair/BSC Sit to Stand: Supervision;Min guard   Step pivot transfers: Min guard       General transfer comment: rising from [facility] on her own,  but A for safety, pivot back to bed with RW with minguard A; up from bed to rollator with minguard A    Ambulation/Gait Ambulation/Gait assistance: Min guard Gait Distance (Feet): 120 Feet Assistive device: Rollator (4 wheels) Gait Pattern/deviations: Step-to pattern;Decreased stride length;Decreased dorsiflexion - left;Shuffle;Trunk flexed       General Gait Details: vaulting on R LE to clear L due to dragging L leg behind her  Stairs            Wheelchair Mobility    Modified Rankin (Stroke Patients Only)       Balance Overall balance assessment: Needs assistance   Sitting balance-Leahy Scale: Good Sitting balance - Comments: leaning over to don her pants despite cues to let PT help to avoid increased pain from surgery   Standing balance support: Bilateral upper extremity supported;Single extremity supported;During functional activity;No upper extremity supported Standing balance-Leahy Scale: Poor Standing balance comment: at least single UE support except pulling up pants and needed min A for safety                             Pertinent Vitals/Pain Faces Pain Scale: Hurts even more Pain Location: surgery areas Pain Descriptors / Indicators: Sore Pain Intervention(s): Monitored during session;Repositioned    Home Living Family/patient expects to be discharged to:: Private residence Living Arrangements: Spouse/significant other Available Help at Discharge: Family;Available PRN/intermittently Type of Home: House Home Access: Other (comment)     Alternate Level Stairs-Number of Steps: full flight Home Layout: Two level;Able to live on main level with bedroom/bathroom Home  Equipment: Grab bars - tub/shower;Grab bars - toilet;Rolling Walker (2 wheels);Rollator (4 wheels);Wheelchair - Education officer, community - power;Shower seat - built Academic librarian Comments: reports baseline uses 4ww for transfers. pt has adaptive utensils to eat and noted  increased trouble with L UE    Prior Function Prior Level of Function : Needs assist       Physical Assist : Mobility (physical);ADLs (physical)   ADLs (physical): IADLs         Hand Dominance   Dominant Hand: Right    Extremity/Trunk Assessment   Upper Extremity Assessment Upper Extremity Assessment: Defer to OT evaluation    Lower Extremity Assessment Lower Extremity Assessment: RLE deficits/detail;LLE deficits/detail RLE Deficits / Details: able to lift antigravity in sitting, but with some increased tone noted, AROM generally WFL, but weak ankle DF RLE Sensation: decreased light touch LLE Deficits / Details: unable to lift antigravity without help, increased tone noted and drags with ambulation LLE Sensation: decreased light touch    Cervical / Trunk Assessment Cervical / Trunk Assessment: Back Surgery  Communication   Communication: Other (comment) (some dysarthria at baseline)  Cognition Arousal/Alertness: Awake/alert Behavior During Therapy: WFL for tasks assessed/performed Overall Cognitive Status: No family/caregiver present to determine baseline cognitive functioning                       Memory: Decreased short-term memory         General Comments: possibly with some baseline deficits, no family present        General Comments General comments (skin integrity, edema, etc.): Reported history of using Bioness from neuro-outpatient and stopped while with Baclofen pump.  Hopeful to use again, asking about parameters to increase wear, reached out to Audra at outpatient to get protocol.    Exercises     Assessment/Plan    PT Assessment Patient needs continued PT services  PT Problem List Decreased strength;Decreased mobility;Decreased coordination       PT Treatment Interventions DME instruction;Therapeutic activities;Patient/family education;Gait training;Functional mobility training    PT Goals (Current goals can be found in the Care  Plan section)  Acute Rehab PT Goals Patient Stated Goal: to return to independent PT Goal Formulation: With patient Time For Goal Achievement: 04/14/21 Potential to Achieve Goals: Good    Frequency Min 3X/week   Barriers to discharge        Co-evaluation               AM-PAC PT "6 Clicks" Mobility  Outcome Measure Help needed turning from your back to your side while in a flat bed without using bedrails?: A Little Help needed moving from lying on your back to sitting on the side of a flat bed without using bedrails?: A Little Help needed moving to and from a bed to a chair (including a wheelchair)?: A Little Help needed standing up from a chair using your arms (e.g., wheelchair or bedside chair)?: A Little Help needed to walk in hospital room?: A Little Help needed climbing 3-5 steps with a railing? : A Little 6 Click Score: 18    End of Session   Activity Tolerance: Patient tolerated treatment well Patient left: in chair;with call bell/phone within reach (in her maual w/c from home)   PT Visit Diagnosis: Muscle weakness (generalized) (M62.81);Other symptoms and signs involving the nervous system (R29.898)    Time: 1040-1110 PT Time Calculation (min) (ACUTE ONLY): 30 min   Charges:   PT Evaluation $PT Eval Moderate Complexity: 1  Mod PT Treatments $Gait Training: 8-22 mins        Sheran Lawless, PT Acute Rehabilitation Services [phone] Office:[phone] 04/07/2021   Elray Mcgregor 04/07/2021, 1:37 PM

## 2021-04-07 NOTE — Evaluation (Signed)
Occupational Therapy Evaluation Patient Details Name: Sharon Odom MRN: [id number] DOB: 05/14/1971 Today's Date: 04/07/2021   History of Present Illness 50 yo female admitted for bacolfen pump removal 04/06/21 PMH MS arthritis   Clinical Impression   Patient is s/p bacolfen pump removal surgery resulting in functional limitations due to the deficits listed below (see OT problem list). Pt with 1 dose of oral bacolfen noted in the chart and reports L UE deficits with shoulder flexion and fine motor. Pt with pending brace arrival with spouse. OT to figure explore L UE deficits and changes from baseline L UE next session. Patient will benefit from skilled OT acutely to increase independence and safety with ADLS to allow discharge outpatient balance / L UE .      Recommendations for follow up therapy are one component of a multi-disciplinary discharge planning process, led by the attending physician.  Recommendations may be updated based on patient status, additional functional criteria and insurance authorization.   Follow Up Recommendations  Outpatient OT    Assistance Recommended at Discharge Intermittent Supervision/Assistance  Functional Status Assessment  Patient has had a recent decline in their functional status and demonstrates the ability to make significant improvements in function in a reasonable and predictable amount of time.  Equipment Recommendations  None recommended by OT    Recommendations for Other Services PT consult     Precautions / Restrictions Precautions Precautions: Fall      Mobility Bed Mobility Overal bed mobility: Needs Assistance Bed Mobility: Rolling;Supine to Sit Rolling: Mod assist   Supine to sit: Min assist;HOB elevated     General bed mobility comments: pt with pain at surgerical sites so pad used toward scapula to help with rolling R and L for brief change. pt with purewick and brief and reports its "wet" pt with new brief done. pt exiting  on the R side due to incision on L flank and reports minimal ability to roll toward that side. pt reports baseline exit on L side. Pt with HOB increased to help with trunk elevation from bed surface max (A) to bring bil LE off bed surface. pt static sitting min guard (A).    Transfers Overall transfer level: Needs assistance Equipment used: 1 person hand held assist Transfers: Sit to/from Stand Sit to Stand: Min assist           General transfer comment: pt requires (A) to place bil Le feet apart to prepare for standing. pt sit<>stand with face to face transfer. pt able to initiate stepping with R LE toward w/c. pt needs (A) to control descent      Balance Overall balance assessment: Mild deficits observed, not formally tested;Needs assistance Sitting-balance support: Bilateral upper extremity supported;Feet supported Sitting balance-Leahy Scale: Fair     Standing balance support: Bilateral upper extremity supported;During functional activity Standing balance-Leahy Scale: Poor                             ADL either performed or assessed with clinical judgement   ADL Overall ADL's : Needs assistance/impaired Eating/Feeding: Minimal assistance   Grooming: Minimal assistance   Upper Body Bathing: Minimal assistance   Lower Body Bathing: Maximal assistance       Lower Body Dressing: Maximal assistance   Toilet Transfer: Minimal assistance;Stand-pivot;BSC/3in1             General ADL Comments: pt oob to w/c this session going toward R side due to L  LE weakness     Vision Baseline Vision/History: 1 Wears glasses Ability to See in Adequate Light: 0 Adequate Patient Visual Report: No change from baseline       Perception     Praxis      Pertinent Vitals/Pain Pain Assessment: Faces Faces Pain Scale: Hurts even more Pain Location: surgery areas Pain Descriptors / Indicators: Sore Pain Intervention(s): Monitored during session;Premedicated before  session;Repositioned     Hand Dominance Right   Extremity/Trunk Assessment Upper Extremity Assessment Upper Extremity Assessment: LUE deficits/detail LUE Deficits / Details: pt noted to have decreased flexion of 3rd and 4th digit. pt noted to sustain 3rd digit extension even with other digit flexion. pt reports having a brace spouse will bring to room. pt reports dropping items and decreased ability to hold AE utensils. shoulder flexion to 60 degrees and using R UE to help assist higher. pt with decreased wrist extension LUE Coordination: decreased fine motor;decreased gross motor   Lower Extremity Assessment Lower Extremity Assessment: Defer to PT evaluation   Cervical / Trunk Assessment Cervical / Trunk Assessment: Back Surgery   Communication Communication Communication: Other (comment) (quiet voice quality and slight slurr to speech.. pt states i need to get a megaphone. no family present to confirm baseline voice)   Cognition Arousal/Alertness: Awake/alert Behavior During Therapy: WFL for tasks assessed/performed Overall Cognitive Status: Impaired/Different from baseline Area of Impairment: Memory                     Memory: Decreased short-term memory         General Comments: pt inconsistently reportsing medications that is documented in the chart. pt reports inability to sleep at this tie and Remus Loffler was provided to patient.     General Comments  dressing intact at this time    Exercises     Shoulder Instructions      Home Living Family/patient expects to be discharged to:: Private residence Living Arrangements: Spouse/significant other Available Help at Discharge: Family;Available PRN/intermittently Type of Home: House Home Access: Other (comment)     Home Layout: Two level;Able to live on main level with bedroom/bathroom Alternate Level Stairs-Number of Steps: full flight   Bathroom Shower/Tub: Other (comment)   Bathroom Toilet: Handicapped  height Bathroom Accessibility: Yes How Accessible: Accessible via wheelchair;Accessible via walker Home Equipment: Grab bars - tub/shower;Grab bars - toilet;Rolling Walker (2 wheels);Rollator (4 wheels);Wheelchair - Engineer, technical sales - power;Shower seat - built Designer, fashion/clothing: Feeding equipment Additional Comments: reports baseline uses 4ww for transfers. pt has adaptive utensils to eat and noted increased trouble with L UE      Prior Functioning/Environment Prior Level of Function : Needs assist       Physical Assist : Mobility (physical);ADLs (physical)   ADLs (physical): IADLs   ADLs Comments: pt reports wearing L hand splint after last surgery for 16 weeks with good return spouse to bring brace. pt is inconsistent in description of L UE and reports it has worsen with MS. see hand area.        OT Problem List: Decreased strength;Impaired balance (sitting and/or standing);Decreased activity tolerance;Decreased range of motion;Decreased coordination;Decreased cognition;Decreased safety awareness;Pain;Impaired UE functional use      OT Treatment/Interventions: Self-care/ADL training;Therapeutic exercise;Neuromuscular education;Energy conservation;DME and/or AE instruction;Manual therapy;Modalities;Therapeutic activities;Cognitive remediation/compensation;Patient/family education;Balance training    OT Goals(Current goals can be found in the care plan section) Acute Rehab OT Goals Patient Stated Goal: to work on L UE OT Goal Formulation: With patient Time For Goal  Achievement: 04/21/21 Potential to Achieve Goals: Good  OT Frequency: Min 2X/week   Barriers to D/C:            Co-evaluation              AM-PAC OT "6 Clicks" Daily Activity     Outcome Measure Help from another person eating meals?: A Little Help from another person taking care of personal grooming?: A Little Help from another person toileting, which includes using toliet, bedpan,  or urinal?: A Lot Help from another person bathing (including washing, rinsing, drying)?: A Lot Help from another person to put on and taking off regular upper body clothing?: A Little Help from another person to put on and taking off regular lower body clothing?: A Lot 6 Click Score: 15   End of Session Equipment Utilized During Treatment: Gait belt Nurse Communication: Mobility status;Precautions  Activity Tolerance: Patient tolerated treatment well Patient left: in chair;with call bell/phone within reach;with nursing/sitter in room  OT Visit Diagnosis: Unsteadiness on feet (R26.81);Muscle weakness (generalized) (M62.81);Pain Pain - Right/Left: Left                Time: [id number] OT Time Calculation (min): 26 min Charges:  OT General Charges $OT Visit: 1 Visit OT Evaluation $OT Eval Moderate Complexity: 1 Mod  Brynn, OTR/L  Acute Rehabilitation Services Pager: [phone] Office: [phone] .   Jeri Modena 04/07/2021, 10:09 AM

## 2021-04-07 NOTE — Progress Notes (Addendum)
Subjective: Patient stated that she is "miserable." She reports the spacticity in her BLE is returning. She has appropriate incisional pain, left posterolateral incision > LLQ incision. No acute events overnight.   Objective: Vital signs in last 24 hours: Temp:  [97.8 F (36.6 C)-98.5 F (36.9 C)] 98.1 F (36.7 C) (11/11 0825) Pulse Rate:  [91-113] 107 (11/11 0825) Resp:  [11-19] 18 (11/11 0825) BP: (123-159)/(68-100) 135/92 (11/11 0825) SpO2:  [92 %-100 %] 97 % (11/11 0825)  Intake/Output from previous day: 11/10 0701 - 11/11 0700 In: 1600 [I.V.:1600] Out: 1580 [Urine:1555; Blood:25] Intake/Output this shift: No intake/output data recorded.  Physical Exam: Patient is awake, A/O X 3, conversant, and in good spirits. They are in NAD and VSS. Doing well. MAEW, generalized weakness present. Sensation to light touch is intact. PERLA, EOMI. CNs grossly intact. Dressings are clean dry intact. Incisions are well approximated with no drainage, erythema, or edema.    Lab Results: Recent Labs    04/06/21 1543  WBC 8.0  HGB 13.3  HCT 40.6  PLT 317   BMET Recent Labs    04/07/21 0339  NA 138  K 4.2  CL 103  CO2 24  GLUCOSE 138*  BUN 8  CREATININE 0.73  CALCIUM 9.0    Studies/Results: No results found.  Assessment/Plan: Patient is postop day #1 s/p removal of baclofen pump, left lower quadrant due to baclofen pump infection.The patient is at high risk for baclofen withdrawal. Please monitor the patient closely for sings of withdrawal. At this time, she is doing well and she remains at her neurological baseline. She does note increased spasticity in her BLE. Posterolateral incision with no signs of CSF. Appropriate incisional pain.   Per Dr. Berline Chough, 40 mg 3 times daily of baclofen, Valium 5 mg 3-4 times per day, Periactin 4 mg 3-4 times per day to minimize/prevent withdrawal.   In the event of concern for withdrawal please call Dr. Berline Chough at [phone] or page the on-call  neurosurgeon immediately.   LOS: 1 day     Council Mechanic, DNP, NP-C  04/07/2021, 8:39 AM   ADDENDUM:  Pt s/e Agree with above  Adding ambien for insomnia  Possible dc home tomorrow with oral clindamycin    Thank you for allowing me to participate in this patient's care.  Please do not hesitate to call with questions or concerns.   Monia Pouch, DO Neurosurgeon [facility] Cell: [phone]

## 2021-04-08 MED ORDER — TIZANIDINE HCL 4 MG PO TABS
4.0000 mg | ORAL_TABLET | Freq: Three times a day (TID) | ORAL | Status: DC
  Administered 2021-04-08 – 2021-04-15 (×21): 4 mg via ORAL
  Filled 2021-04-08 (×21): qty 1

## 2021-04-08 NOTE — Progress Notes (Signed)
Patient ID: Sharon Odom, female   DOB: 02/09/1971, 49 y.o.   MRN: [id number] BP 106/80   Pulse 93   Temp 98.6 F (37 C) (Oral)   Resp 19   Ht 5\' 9"  (1.753 m)   Wt 57.2 kg   SpO2 94%   BMI 18.61 kg/m  Alert and oriented x 4, speech is clear and fluent Moving all extremities Wound is clean, and dry Changed tizanindine frequency

## 2021-04-08 NOTE — Plan of Care (Signed)
  Problem: Education: Goal: Knowledge of General Education information will improve Description: Including pain rating scale, medication(s)/side effects and non-pharmacologic comfort measures 04/08/2021 1133 by Genevie Ann, RN Outcome: Progressing 04/08/2021 1133 by Genevie Ann, RN Outcome: Progressing

## 2021-04-08 NOTE — Plan of Care (Signed)
  Problem: Education: Goal: Knowledge of General Education information will improve Description Including pain rating scale, medication(s)/side effects and non-pharmacologic comfort measures Outcome: Progressing   Problem: Health Behavior/Discharge Planning: Goal: Ability to manage health-related needs will improve Outcome: Progressing   

## 2021-04-09 LAB — BASIC METABOLIC PANEL
Anion gap: 8 (ref 5–15)
BUN: 11 mg/dL (ref 6–20)
CO2: 23 mmol/L (ref 22–32)
Calcium: 8.7 mg/dL — ABNORMAL LOW (ref 8.9–10.3)
Chloride: 102 mmol/L (ref 98–111)
Creatinine, Ser: 0.78 mg/dL (ref 0.44–1.00)
GFR, Estimated: >60 mL/min (ref >60)
Glucose, Bld: 156 mg/dL — ABNORMAL HIGH (ref 70–99)
Potassium: 4.1 mmol/L (ref 3.5–5.1)
Sodium: 133 mmol/L — ABNORMAL LOW (ref 135–145)

## 2021-04-09 MED ORDER — FAMOTIDINE 20 MG PO TABS
10.0000 mg | ORAL_TABLET | Freq: Every day | ORAL | Status: DC
  Administered 2021-04-09 – 2021-04-14 (×6): 10 mg via ORAL
  Filled 2021-04-09 (×6): qty 1

## 2021-04-09 MED ORDER — SODIUM CHLORIDE 0.9 % IV SOLN
2.0000 g | Freq: Three times a day (TID) | INTRAVENOUS | Status: DC
  Administered 2021-04-09 – 2021-04-15 (×19): 2 g via INTRAVENOUS
  Filled 2021-04-09 (×19): qty 2

## 2021-04-09 MED ORDER — OXYCODONE HCL 5 MG PO TABS: 15.0000 mg | ORAL_TABLET | ORAL | Status: DC | PRN

## 2021-04-09 NOTE — Progress Notes (Signed)
Pharmacy Antibiotic Note  Sharon Odom is a 50 y.o. female admitted on 04/06/2021 with infected baclofen pump, removed in OR 11/10.  Pharmacy has been consulted for vancomycin dosing for wound infection - day #4. Now consulted to add cefepime for abdominal wound pocket cultures now growing serratia marcescens. SCr stable at 0.78.  Plan: Start cefepime 2g IV q8h Vancomycin 750mg  IV q12h - f/u with Neurosurgery if can d/c? Monitor clinical progress, c/s, renal function F/u de-escalation plan/LOT, vancomycin levels as indicated  Height: 5\' 9"  (175.3 cm) Weight: 57.2 kg (126 lb) IBW/kg (Calculated) : 66.2  Temp (24hrs), Avg:99.4 F (37.4 C), Min:97.9 F (36.6 C), Max:100.1 F (37.8 C)  Recent Labs  Lab 04/06/21 1543 04/07/21 0339 04/09/21 0944  WBC 8.0  --   --   CREATININE  --  0.73 0.78    Estimated Creatinine Clearance: 76.8 mL/min (by C-G formula based on SCr of 0.78 mg/dL).    Allergies  Allergen Reactions   Morphine Other (See Comments)    Pt does not recall reaction    Amoxicillin Nausea And Vomiting   Ziconotide Acetate     Anorexia, weird sensations, could not talk, choking feeling, lost since of taste and smell. Hallucinations, vivid dreams. Confusion and sleepiness. N/v, increase in pain.     13/11/22, PharmD, BCPS Please check AMION for all [facility] contact numbers Clinical Pharmacist 04/09/2021 1:19 PM

## 2021-04-09 NOTE — Progress Notes (Signed)
Patient ID: Sharon Odom, female   DOB: 26-Oct-1970, 50 y.o.   MRN: [id number] BP 104/66 (BP Location: Right Arm)   Pulse 94   Temp 99.4 F (37.4 C) (Oral)   Resp (!) 22   Ht 5\' 9"  (1.753 m)   Wt 57.2 kg   SpO2 93%   BMI 18.61 kg/m  Alert and oriented x 4, speech is dysarthric Follows all commands Complaining of spasms yesterday, no more medication at this time.  Will increase the oxycodone to 15mg 

## 2021-04-10 MED ORDER — OXYCODONE HCL 5 MG PO TABS
10.0000 mg | ORAL_TABLET | Freq: Four times a day (QID) | ORAL | Status: DC | PRN
  Administered 2021-04-10 – 2021-04-11 (×2): 10 mg via ORAL
  Filled 2021-04-10 (×2): qty 2

## 2021-04-10 NOTE — Progress Notes (Signed)
Physical Therapy Treatment Patient Details Name: Sharon Odom MRN: [id number] DOB: 04-28-1971 Today's Date: 04/10/2021   History of Present Illness 50 yo female admitted for bacolfen pump removal 04/06/21 PMH MS arthritis.    PT Comments    Pt reporting back incisional pain, but eager to mobilize OOB. Pt with worsening LLE extensor tone, requiring max swing phase facilitation and mod physical assist during gait. Pt requires significantly increased time for bed mobility, transfers, and gait, and can no longer perform LB dressing or return to supine independently, which is pt baseline. Pt also with tremors when fatigued during gait, requiring return to bed. PT strongly recommending post-acute rehab, pt would like to d/c home so she can visit with her brother, but PT encouraged both pt and husband to consider this for pt safety. PT to continue to follow.     Recommendations for follow up therapy are one component of a multi-disciplinary discharge planning process, led by the attending physician.  Recommendations may be updated based on patient status, additional functional criteria and insurance authorization.  Follow Up Recommendations  Acute inpatient rehab (3hours/day)     Assistance Recommended at Discharge Frequent or constant Supervision/Assistance  Equipment Recommendations  None recommended by PT    Recommendations for Other Services       Precautions / Restrictions Precautions Precautions: Fall Precaution Comments: RLE spasticity Restrictions Weight Bearing Restrictions: No     Mobility  Bed Mobility Overal bed mobility: Needs Assistance Bed Mobility: Supine to Sit;Sit to Supine     Supine to sit: Min assist Sit to supine: Mod assist   General bed mobility comments: min assist for trunk rise to EOB, mod assist for return to supine for LE lifting into bed, repositioning trunk/hips. Increased time, max sequencing cues.    Transfers Overall transfer level: Needs  assistance Equipment used: Rolling walker (2 wheels) Transfers: Sit to/from Stand Sit to Stand: Min assist           General transfer comment: min assist for power up, rise, steadying. Cues for hand placement when rising/sitting. STS x2, both times from EOB    Ambulation/Gait Ambulation/Gait assistance: Mod assist Gait Distance (Feet): 35 Feet Assistive device: Rolling walker (2 wheels) Gait Pattern/deviations: Step-to pattern;Decreased stride length;Decreased dorsiflexion - left;Shuffle;Trunk flexed Gait velocity: decr     General Gait Details: assist for LLE swing phase facilitation as PT had to unlock knee extension physically via knee flexion, steadying, RW maneuvering especially during directional changes   Stairs             Wheelchair Mobility    Modified Rankin (Stroke Patients Only)       Balance Overall balance assessment: Needs assistance Sitting-balance support: Bilateral upper extremity supported Sitting balance-Leahy Scale: Fair     Standing balance support: Bilateral upper extremity supported;Single extremity supported;During functional activity;No upper extremity supported Standing balance-Leahy Scale: Poor Standing balance comment: reliant on AD, PT steadying                            Cognition Arousal/Alertness: Awake/alert Behavior During Therapy: WFL for tasks assessed/performed Overall Cognitive Status: No family/caregiver present to determine baseline cognitive functioning Area of Impairment: Memory;Problem solving                     Memory: Decreased short-term memory       Problem Solving: Slow processing;Decreased initiation;Difficulty sequencing          Exercises  General Comments General comments (skin integrity, edema, etc.): vss; incisions swollen back>abdominal. RN aware      Pertinent Vitals/Pain Pain Assessment: Faces Faces Pain Scale: Hurts little more Pain Location: surgery  sites Pain Descriptors / Indicators: Sore Pain Intervention(s): Limited activity within patient's tolerance;Monitored during session;Repositioned    Home Living                          Prior Function            PT Goals (current goals can now be found in the care plan section) Acute Rehab PT Goals Patient Stated Goal: to return to independent PT Goal Formulation: With patient Time For Goal Achievement: 04/14/21 Potential to Achieve Goals: Good Progress towards PT goals: Progressing toward goals    Frequency    Min 3X/week      PT Plan Discharge plan needs to be updated    Co-evaluation              AM-PAC PT "6 Clicks" Mobility   Outcome Measure  Help needed turning from your back to your side while in a flat bed without using bedrails?: A Little Help needed moving from lying on your back to sitting on the side of a flat bed without using bedrails?: A Lot Help needed moving to and from a bed to a chair (including a wheelchair)?: A Lot Help needed standing up from a chair using your arms (e.g., wheelchair or bedside chair)?: A Little Help needed to walk in hospital room?: A Lot Help needed climbing 3-5 steps with a railing? : Total 6 Click Score: 13    End of Session   Activity Tolerance: Patient limited by fatigue Patient left: with call bell/phone within reach;in bed;with bed alarm set;with family/visitor present Nurse Communication: Mobility status PT Visit Diagnosis: Muscle weakness (generalized) (M62.81);Other symptoms and signs involving the nervous system (R29.898)     Time: 5631-4970 PT Time Calculation (min) (ACUTE ONLY): 49 min  Charges:  $Gait Training: 8-22 mins $Therapeutic Activity: 23-37 mins                    Marye Round, PT DPT Acute Rehabilitation Services Pager [phone]  Office [phone]    Tyrone Apple E Christain [place] 04/10/2021, 5:06 PM

## 2021-04-10 NOTE — Progress Notes (Signed)
Pt noted this afternoon to have what appears to be a fluid filled area around incision that has grown from what was seen this am. Honeycomb dressing no longer intact, so was removed to fully visualize area. Hardened borders are felt and tender to palpation. Pt c/o of incisional pain primarily in her back, but also tender on her flank incision. I messaged Docia Barrier, NP about this and also marked the palpated borders with a skin marker at this time. New honeycomb placed on incision. Pt denies HA or dizziness on standing/working with therapy. Vital signs stable throughout the day.   Pt also c/o UTI. She states that her urine has an odor and feels spasms when urinating. Denies burning sensation or changes in urgency or frequency. See new orders.   Robina Ade, RN

## 2021-04-10 NOTE — Progress Notes (Signed)
  Inpatient Rehab Admissions Coordinator :  Per therapy recommendations, patient was screened for CIR candidacy by Ottie Glazier RN MSN.  At this time patient appears to be a potential candidate for CIR. Please place a rehab consult order if patient would like to be considered for admit. Please call me with any questions.  Ottie Glazier RN MSN Admissions Coordinator [phone]

## 2021-04-10 NOTE — Progress Notes (Signed)
OT NOTE  Pt's wound sites appear swollen and pt reports discomfort. RN notified and addressing concern for edema. Pt self reporting that she has a UTI due to odor during void this session. Pt also noted to have redness on sacrum and question purewick being a source. RN to hold purewick and check skin redness to ensure diminished.   Timmothy Euler, OTR/L  Acute Rehabilitation Services Pager: [phone] Office: [phone] .

## 2021-04-10 NOTE — Progress Notes (Signed)
Subjective: Patient reports that she is doing well. She has appropriate incisional pain.. Muscle spasms have improved from yesterday. No acute events overnight.   Objective: Vital signs in last 24 hours: Temp:  [97.7 F (36.5 C)-99.7 F (37.6 C)] 97.7 F (36.5 C) (11/14 0822) Pulse Rate:  [80-99] 82 (11/14 0822) Resp:  [16-22] 18 (11/14 0822) BP: (92-119)/(54-88) 98/56 (11/14 0822) SpO2:  [93 %-97 %] 97 % (11/14 0822)  Intake/Output from previous day: 11/13 0701 - 11/14 0700 In: -  Out: 800 [Urine:800] Intake/Output this shift: No intake/output data recorded.  Physical Exam: Patient is awake, A/O X 4, conversant, and in good spirits. They are in NAD and VSS. Doing well. Speech appropriate. MAEW. She has generalized weakness. Sensation to light touch is intact. PERLA, EOMI. CNs grossly intact. Face is symmetric. Dressings are clean dry intact. Incisions are well approximated with no drainage, erythema, or edema.   Lab Results: No results for input(s): WBC, HGB, HCT, PLT in the last 72 hours. BMET Recent Labs    04/09/21 0944  NA 133*  K 4.1  CL 102  CO2 23  GLUCOSE 156*  BUN 11  CREATININE 0.78  CALCIUM 8.7*    Studies/Results: No results found.  Assessment/Plan: Patient is postop s/p removal of baclofen pump, left lower quadrant due to baclofen pump infection.The patient is high risk for baclofen withdrawal. Please monitor the patient closely for sings of withdrawal. She is currently doing well and reports minimal "itchiness." Her neuro exam is stable and at her baseline. She continues to note increased spasticity in her BLE. Posterolateral incision with no signs of CSF. Appropriate incisional pain. Continue working with therapies.    Per Dr. Berline Chough, 40 mg 3 times daily of baclofen, Valium 5 mg 3-4 times per day, Periactin 4 mg 3-4 times per day to minimize/prevent withdrawal.   In the event of concern for withdrawal please call Dr. Berline Chough at [phone] or page the  on-call neurosurgeon immediately.   LOS: 4 days     Council Mechanic, DNP, NP-C 04/10/2021, 8:37 AM

## 2021-04-10 NOTE — Progress Notes (Signed)
Occupational Therapy Treatment Patient Details Name: Sharon Odom MRN: [id number] DOB: [birth year] Today's Date: 04/10/2021   History of present illness 50 yo female admitted for bacolfen pump removal 04/06/21 PMH MS arthritis.   OT comments  Pt at baseline ambulates in her home with DME as needed. Pt currently is dependent on therapist to advance LLE and extended time to transfer to the bathroom >15 minutes. Pt motivated but requires increased (A) compared to baseline. Recommendation updated to CIR at this time to return to prior level of function. Pt also noted to have more slurred to speech and less secretion control compared to previous session.    Recommendations for follow up therapy are one component of a multi-disciplinary discharge planning process, led by the attending physician.  Recommendations may be updated based on patient status, additional functional criteria and insurance authorization.    Follow Up Recommendations  Acute inpatient rehab (3hours/day)    Assistance Recommended at Discharge Intermittent Supervision/Assistance  Equipment Recommendations  None recommended by OT    Recommendations for Other Services PT consult    Precautions / Restrictions Precautions Precautions: Fall       Mobility Bed Mobility Overal bed mobility: Needs Assistance Bed Mobility: Sit to Supine       Sit to supine: Max assist   General bed mobility comments: pt requires hand over hand to initiate and max (A) to lift bil Le on the bed surface. pt rolling mod (A)    Transfers                         Balance           Standing balance support: Bilateral upper extremity supported;Reliant on assistive device for balance Standing balance-Leahy Scale: Poor                             ADL either performed or assessed with clinical judgement   ADL Overall ADL's : Needs assistance/impaired Eating/Feeding: Set up;Sitting Eating/Feeding Details (indicate  cue type and reason): pt with prolonged time to eat less than 50% of food. pt allowed >1 hr to have lunch.                     Toilet Transfer: Maximal assistance;Rolling walker (2 wheels);BSC/3in1 Toilet Transfer Details (indicate cue type and reason): pt require extended time with RW to transfer to commode. pt requires therapist to forward progress LLE every step. Toileting- Clothing Manipulation and Hygiene: Min guard;Sitting/lateral lean Toileting - Clothing Manipulation Details (indicate cue type and reason): pt self reporting feeling she has a UTI based on odor. Rn made aware and present     Functional mobility during ADLs: Maximal assistance;Rolling walker (2 wheels) General ADL Comments: pt unable to advance LLE during session. pt weight shifting to R LE and reports LLE feels like a magnet to the floor. pt states that the sock is part of the issue. Sock was doff for stand pivot to bed to try and slide foot with same demo.    Extremity/Trunk Assessment Upper Extremity Assessment Upper Extremity Assessment: LUE deficits/detail LUE Deficits / Details: pt using hand over hand to position L hand during session on RW and to reach for bed rail to (A) with transfer. pt decreased digit extension. pt noted to have resting hand splint in room ( prefabricated blue hanger resting hand splint) No family present to inquire further about hand splinting. LUE  Coordination: decreased fine motor;decreased gross motor            Vision       Perception     Praxis      Cognition Arousal/Alertness: Awake/alert Behavior During Therapy: WFL for tasks assessed/performed Overall Cognitive Status: No family/caregiver present to determine baseline cognitive functioning Area of Impairment: Memory                     Memory: Decreased short-term memory                    Exercises     Shoulder Instructions       General Comments VSS- incision sites are very swollen. RN  present and notifying surgerical team. the posterior back wound is extended > 4 cm from back surface but not red in color. The abdominal site is red margins and extended as well but posterior site more swollen. pt prolonged sitting prior to OT session.    Pertinent Vitals/ Pain       Pain Assessment: Faces Faces Pain Scale: Hurts even more Pain Location: surgery sites Pain Descriptors / Indicators: Sore Pain Intervention(s): Monitored during session;Premedicated before session;Repositioned  Home Living                                          Prior Functioning/Environment              Frequency  Min 2X/week        Progress Toward Goals  OT Goals(current goals can now be found in the care plan section)  Progress towards OT goals: Not progressing toward goals - comment  Acute Rehab OT Goals Patient Stated Goal: to work on L hand OT Goal Formulation: With patient Time For Goal Achievement: 04/21/21 Potential to Achieve Goals: Good ADL Goals Pt Will Perform Eating: with min guard assist;with adaptive utensils;sitting Pt Will Perform Grooming: with min guard assist;with adaptive equipment;sitting Pt Will Transfer to Toilet: with min guard assist;ambulating;regular height toilet Pt/caregiver will Perform Home Exercise Program: Increased ROM;Left upper extremity;With minimal assist;With written HEP provided Additional ADL Goal #1: pt will complete bed mobility supervision level as precursor to adls  Plan Discharge plan needs to be updated    Co-evaluation                 AM-PAC OT "6 Clicks" Daily Activity     Outcome Measure   Help from another person eating meals?: A Little Help from another person taking care of personal grooming?: A Little Help from another person toileting, which includes using toliet, bedpan, or urinal?: A Lot Help from another person bathing (including washing, rinsing, drying)?: A Lot Help from another person to put on  and taking off regular upper body clothing?: A Little Help from another person to put on and taking off regular lower body clothing?: A Lot 6 Click Score: 15    End of Session Equipment Utilized During Treatment: Rolling walker (2 wheels)  OT Visit Diagnosis: Unsteadiness on feet (R26.81);Muscle weakness (generalized) (M62.81);Pain Pain - Right/Left: Left   Activity Tolerance Patient tolerated treatment well   Patient Left in bed;with call bell/phone within reach;Other (comment) (left with RN and student RN present doing needs at bed level)   Nurse Communication Mobility status;Precautions        Time: 2197-5883 OT Time Calculation (min): 36 min  Charges: OT General Charges $OT Visit: 1 Visit OT Treatments $Self Care/Home Management : 23-37 mins   Brynn, OTR/L  Acute Rehabilitation Services Pager: [phone] Office: [phone] .   Mateo Flow 04/10/2021, 3:40 PM

## 2021-04-11 LAB — URINALYSIS, ROUTINE W REFLEX MICROSCOPIC
Bilirubin Urine: NEGATIVE
Glucose, UA: NEGATIVE mg/dL
Hgb urine dipstick: NEGATIVE
Ketones, ur: NEGATIVE mg/dL
Nitrite: NEGATIVE
Protein, ur: NEGATIVE mg/dL
Specific Gravity, Urine: 1.019 (ref 1.005–1.030)
pH: 6 (ref 5.0–8.0)

## 2021-04-11 MED ORDER — POLYETHYLENE GLYCOL 3350 17 G PO PACK
17.0000 g | PACK | Freq: Every day | ORAL | Status: DC
  Administered 2021-04-12 – 2021-04-15 (×4): 17 g via ORAL
  Filled 2021-04-11 (×4): qty 1

## 2021-04-11 MED ORDER — NUTRISOURCE FIBER PO PACK
1.0000 | PACK | Freq: Two times a day (BID) | ORAL | Status: DC
  Administered 2021-04-11 – 2021-04-15 (×7): 1
  Filled 2021-04-11 (×9): qty 1

## 2021-04-11 MED ORDER — OXYCODONE HCL 5 MG PO TABS
10.0000 mg | ORAL_TABLET | ORAL | Status: DC | PRN
  Administered 2021-04-11 – 2021-04-14 (×9): 10 mg via ORAL
  Filled 2021-04-11 (×9): qty 2

## 2021-04-11 NOTE — Progress Notes (Signed)
Pt incision on back assessed by this RN and Docia Barrier, NP this morning. Swollen area appeared to be smaller in size and soft to palpation. Pt says it is tender to the touch and has been complaining of this being her main source of pain at this time. At 1240 when PT was working with pt, area increased in size, was again firm to palpation, and is larger than yesterday. Border marked at this time. Area also significantly increased in size ~10 minutes after 2nd border was marked, pt was still up and working with PT. PT noted that pt became more delayed as session progressed and also noted tremors, particularly in pt's head. This information was relayed to Dr. Mattie Marlin medical assistant at the office, who agreed to pass the information along to Docia Barrier, NP.   Robina Ade, RN

## 2021-04-11 NOTE — Progress Notes (Signed)
Physical Therapy Treatment Patient Details Name: Sharon Odom MRN: [id number] DOB: 1971-02-05 Today's Date: 04/11/2021   History of Present Illness 50 yo female admitted for bacolfen pump removal 04/06/21 PMH MS arthritis.    PT Comments    Pt verbalizing she now is agreeable to post-acute rehab upon PT arrival. Pt continues to present with impaired LLE swing phase, requiring PT to assist hip and knee flexion and DF. Pt with limited tolerance for gait, but did tolerate standing x5 minutes while RN assessed pt posterior wound from pump removal. Of note, ncision on back swollen and increasing in size during mobility, pt with increased processing time and delayed responses for second half of gait with increasing tremors. no headache or dizziness noted, RN present and monitoring incisional sites. BP stable when assessed, pt left up in chair with RN aware. PT to continue to follow.     Recommendations for follow up therapy are one component of a multi-disciplinary discharge planning process, led by the attending physician.  Recommendations may be updated based on patient status, additional functional criteria and insurance authorization.  Follow Up Recommendations  Acute inpatient rehab (3hours/day)     Assistance Recommended at Discharge Frequent or constant Supervision/Assistance  Equipment Recommendations  None recommended by PT    Recommendations for Other Services       Precautions / Restrictions Precautions Precautions: Fall Precaution Comments: RLE tone     Mobility  Bed Mobility Overal bed mobility: Needs Assistance Bed Mobility: Supine to Sit     Supine to sit: Mod assist     General bed mobility comments: assist for trunk and LE management L>R, pt using leg lifter to assist. Increased time and effort, especially to scoot to EOB    Transfers Overall transfer level: Needs assistance Equipment used: Rolling walker (2 wheels) Transfers: Sit to/from Stand;Bed to  chair/wheelchair/BSC Sit to Stand: Min assist Stand pivot transfers: Min assist         General transfer comment: min assist for rise, steadying, pivot to [facility]. STS x2, from EOB and BSC.    Ambulation/Gait Ambulation/Gait assistance: Mod assist Gait Distance (Feet): 30 Feet Assistive device: Rolling walker (2 wheels) Gait Pattern/deviations: Decreased stride length;Decreased dorsiflexion - left;Shuffle;Trunk flexed;Step-to pattern Gait velocity: decr     General Gait Details: mod assist to steady, guide LLE into hip/knee flexion and DF, PT utilizing leg lifter to DF assist and provide swing phase assist. Cues for sequencing   Stairs             Wheelchair Mobility    Modified Rankin (Stroke Patients Only)       Balance Overall balance assessment: Needs assistance Sitting-balance support: Bilateral upper extremity supported Sitting balance-Leahy Scale: Fair     Standing balance support: Bilateral upper extremity supported;Single extremity supported;During functional activity;No upper extremity supported Standing balance-Leahy Scale: Poor Standing balance comment: reliant on AD, PT steadying                            Cognition Arousal/Alertness: Awake/alert Behavior During Therapy: WFL for tasks assessed/performed Overall Cognitive Status: No family/caregiver present to determine baseline cognitive functioning Area of Impairment: Memory;Problem solving;Safety/judgement                     Memory: Decreased short-term memory   Safety/Judgement: Decreased awareness of safety   Problem Solving: Slow processing;Decreased initiation;Difficulty sequencing          Exercises  General Comments General comments (skin integrity, edema, etc.): vss, incision on back swollen and increasing in size during mobility, pt with increased processing time and delayed responses for second half of gait with increasing tremors. no headache or dizziness  noted, RN present and monitoring incisional sites. PT assist for pericare after toileting.      Pertinent Vitals/Pain Pain Assessment: Faces Faces Pain Scale: Hurts little more Pain Location: surgery sites Pain Descriptors / Indicators: Sore Pain Intervention(s): Limited activity within patient's tolerance;Monitored during session;Repositioned    Home Living                          Prior Function            PT Goals (current goals can now be found in the care plan section) Acute Rehab PT Goals Patient Stated Goal: to return to independent PT Goal Formulation: With patient Time For Goal Achievement: 04/14/21 Potential to Achieve Goals: Good Progress towards PT goals: Progressing toward goals    Frequency    Min 4X/week      PT Plan Current plan remains appropriate    Co-evaluation              AM-PAC PT "6 Clicks" Mobility   Outcome Measure  Help needed turning from your back to your side while in a flat bed without using bedrails?: A Little Help needed moving from lying on your back to sitting on the side of a flat bed without using bedrails?: A Lot Help needed moving to and from a bed to a chair (including a wheelchair)?: A Lot Help needed standing up from a chair using your arms (e.g., wheelchair or bedside chair)?: A Little Help needed to walk in hospital room?: A Lot Help needed climbing 3-5 steps with a railing? : Total 6 Click Score: 13    End of Session   Activity Tolerance: Patient tolerated treatment well Patient left: with call bell/phone within reach;in chair;with chair alarm set;Other (comment) (CIR admissions coordinator present) Nurse Communication: Mobility status PT Visit Diagnosis: Muscle weakness (generalized) (M62.81);Other symptoms and signs involving the nervous system (R29.898)     Time: 1207-1300 PT Time Calculation (min) (ACUTE ONLY): 53 min  Charges:  $Gait Training: 8-22 mins $Therapeutic Activity: 23-37 mins                     Stacie Glaze, PT DPT Acute Rehabilitation Services Pager [phone]  Office [phone]    [place] 04/11/2021, 3:52 PM

## 2021-04-11 NOTE — Progress Notes (Signed)
Inpatient Rehab Admissions Coordinator:   I spoke with pt. Regarding potential Cir admission. She is interested; however, she is unsure if she will have 24/7 support at d/c, which she will likely need. I spoke with her husband and he states he will call other family to see if they can provide some support while he works. I will open a case with insurance and pursue potential admit pending  insurance auth, bed availability and confirmation of dispo.   Megan Salon, MS, CCC-SLP Rehab Admissions Coordinator  [phone] (celll) [phone] (office)

## 2021-04-11 NOTE — Progress Notes (Signed)
Subjective: Patient reports posterolateral incision pain. Muscle spasms and spacticity remain mild. No acute events overnight.   Objective: Vital signs in last 24 hours: Temp:  [97.7 F (36.5 C)-98.9 F (37.2 C)] 98.9 F (37.2 C) (11/15 0749) Pulse Rate:  [72-93] 78 (11/15 0749) Resp:  [16-20] 16 (11/15 0749) BP: (89-117)/(59-78) 100/65 (11/15 0749) SpO2:  [92 %-97 %] 97 % (11/15 0749)  Intake/Output from previous day: 11/14 0701 - 11/15 0700 In: 1037.4 [P.O.:480; I.V.:57.4; IV Piggyback:500] Out: 1101 [Urine:1100; Stool:1] Intake/Output this shift: No intake/output data recorded.  Physical Exam: Patient is awake, A/O X 4, conversant, and in good spirits. She is in NAD and VSS. Speech appropriate. MAEW. She has generalized weakness. Sensation to light touch is intact. PERLA, EOMI. CNs grossly intact. Face is symmetric. Dressings are clean dry intact. Incisions are well approximated with no drainage, erythema, or edema.   Lab Results: No results for input(s): WBC, HGB, HCT, PLT in the last 72 hours. BMET Recent Labs    04/09/21 0944  NA 133*  K 4.1  CL 102  CO2 23  GLUCOSE 156*  BUN 11  CREATININE 0.78  CALCIUM 8.7*    Studies/Results: No results found.  Assessment/Plan: Patient is postop s/p removal of baclofen pump, left lower quadrant due to baclofen pump infection on 04/06/2021. The patient is high risk for baclofen withdrawal. Please continue to monitor the patient closely for sings of withdrawal. She is currently doing well and reports minimal "itchiness." Her neuro exam is stable and at her baseline. She continues to note mild spasticity in her BLE. Posterolateral incision with mild peri incisional edema with no signs of CSF drainage. Appropriate incisional pain. Abdominal wound culture with serratia marcescens which is sensitive to cefepime. Patient had complaints of odor in urine yesterday and stated she feels as though she has a UTI. UA was performed. PT/OT  recommending CIR. Rehab consult has been placed. Continue working with therapies and encouraging mobilization.    LOS: 5 days     Council Mechanic, DNP, NP-C 04/11/2021, 8:41 AM

## 2021-04-12 LAB — URINE CULTURE: Culture: NO GROWTH

## 2021-04-12 LAB — AEROBIC/ANAEROBIC CULTURE W GRAM STAIN (SURGICAL/DEEP WOUND)

## 2021-04-12 NOTE — Progress Notes (Signed)
Occupational Therapy Treatment Patient Details Name: Sharon Odom MRN: [id number] DOB: [birth year] Today's Date: 04/12/2021   History of present illness 50 yo female admitted for bacolfen pump removal 04/06/21 PMH MS arthritis.   OT comments  Pt progressed from bed to bathroom level adl task with total +2 mod (A) RW use. Pt with decreased LLE advancement and requires (A). (See PT description of LLE (A) in notes). Pt demonstrates LLE foot drop with transfer. Pt requires extensive time to complete all task. Recommendation for CIr at this time.    Recommendations for follow up therapy are one component of a multi-disciplinary discharge planning process, led by the attending physician.  Recommendations may be updated based on patient status, additional functional criteria and insurance authorization.    Follow Up Recommendations  Acute inpatient rehab (3hours/day)    Assistance Recommended at Discharge Intermittent Supervision/Assistance  Equipment Recommendations  None recommended by OT    Recommendations for Other Services PT consult    Precautions / Restrictions Precautions Precautions: Fall Precaution Comments: RLE tone Restrictions Weight Bearing Restrictions: No       Mobility Bed Mobility Overal bed mobility: Needs Assistance Bed Mobility: Supine to Sit     Supine to sit: Min assist     General bed mobility comments: Increased time and effort to complete. MinA for bringing L LE off bed. Able to elevate trunk with increased time and use of bed rail    Transfers Overall transfer level: Needs assistance Equipment used: Rolling walker (2 wheels) Transfers: Sit to/from Stand Sit to Stand: Min assist;+2 safety/equipment           General transfer comment: minA for sit to stand from low bed surface     Balance Overall balance assessment: Needs assistance Sitting-balance support: Bilateral upper extremity supported Sitting balance-Leahy Scale: Fair     Standing  balance support: Bilateral upper extremity supported;Single extremity supported;During functional activity;No upper extremity supported Standing balance-Leahy Scale: Poor Standing balance comment: reliant on AD and external assist                           ADL either performed or assessed with clinical judgement   ADL Overall ADL's : Needs assistance/impaired Eating/Feeding: Set up;Sitting   Grooming: Oral care;Wash/dry face;Set up;Sitting Grooming Details (indicate cue type and reason): pt requires a seated position for safety and balance. pt requires total (A) to open mouth washe due to safety cap. the cap was very secure and difficult to open in general so pt having awareness to ask for help shows good problem solving             Lower Body Dressing: Minimal assistance Lower Body Dressing Details (indicate cue type and reason): don at EOB and (A) during standing for balance               General ADL Comments: pt with foot drop on L ankle at this time and question need for ace wrap for this during next session to help with activation of LLE.    Extremity/Trunk Assessment Upper Extremity Assessment Upper Extremity Assessment: LUE deficits/detail            Vision       Perception     Praxis      Cognition Arousal/Alertness: Awake/alert Behavior During Therapy: WFL for tasks assessed/performed Overall Cognitive Status: No family/caregiver present to determine baseline cognitive functioning Area of Impairment: Memory;Problem solving;Safety/judgement  Memory: Decreased short-term memory   Safety/Judgement: Decreased awareness of safety   Problem Solving: Slow processing;Decreased initiation;Difficulty sequencing General Comments: Pt requesting increased medications but not during session not verbalizing any increased pain present. pt does not recall information from previous session          Exercises     Shoulder  Instructions       General Comments back incision decreased in size compared to last session. pt with borders drawn by RN staff for edges of previous swelling and now reduced based on lines as well    Pertinent Vitals/ Pain       Pain Assessment: Faces Faces Pain Scale: Hurts little more Pain Location: surgery sites Pain Descriptors / Indicators: Sore Pain Intervention(s): Monitored during session;Repositioned;Premedicated before session;Other (comment) (pt requesting additional medications and RN giving very detailed description of medication schedule)  Home Living                                          Prior Functioning/Environment              Frequency  Min 2X/week        Progress Toward Goals  OT Goals(current goals can now be found in the care plan section)  Progress towards OT goals: Progressing toward goals  Acute Rehab OT Goals Patient Stated Goal: to work on getting L side of body to work OT Goal Formulation: With patient Time For Goal Achievement: 04/21/21 Potential to Achieve Goals: Good ADL Goals Pt Will Perform Eating: with min guard assist;with adaptive utensils;sitting Pt Will Perform Grooming: with min guard assist;with adaptive equipment;sitting Pt Will Transfer to Toilet: with min guard assist;ambulating;regular height toilet Pt/caregiver will Perform Home Exercise Program: Increased ROM;Left upper extremity;With minimal assist;With written HEP provided Additional ADL Goal #1: pt will complete bed mobility supervision level as precursor to adls  Plan Discharge plan needs to be updated    Co-evaluation    PT/OT/SLP Co-Evaluation/Treatment: Yes Reason for Co-Treatment: For patient/therapist safety   OT goals addressed during session: ADL's and self-care;Proper use of Adaptive equipment and DME      AM-PAC OT "6 Clicks" Daily Activity     Outcome Measure   Help from another person eating meals?: A Little Help from  another person taking care of personal grooming?: A Little Help from another person toileting, which includes using toliet, bedpan, or urinal?: A Lot Help from another person bathing (including washing, rinsing, drying)?: A Little Help from another person to put on and taking off regular upper body clothing?: A Little Help from another person to put on and taking off regular lower body clothing?: A Little 6 Click Score: 17    End of Session Equipment Utilized During Treatment: Rolling walker (2 wheels)  OT Visit Diagnosis: Unsteadiness on feet (R26.81);Muscle weakness (generalized) (M62.81);Pain Pain - Right/Left: Left   Activity Tolerance Patient tolerated treatment well   Patient Left in chair;with call bell/phone within reach;with chair alarm set   Nurse Communication Mobility status;Patient requests pain meds        Time: [id number] OT Time Calculation (min): 53 min  Charges: OT General Charges $OT Visit: 1 Visit OT Treatments $Self Care/Home Management : 23-37 mins   Brynn, OTR/L  Acute Rehabilitation Services Pager: [phone] Office: [phone] .   Jeri Modena 04/12/2021, 4:12 PM

## 2021-04-12 NOTE — Progress Notes (Signed)
Physical Therapy Treatment Patient Details Name: Sharon Odom MRN: [id number] DOB: 08-27-1970 Today's Date: 04/12/2021   History of Present Illness 50 yo female admitted for bacolfen pump removal 04/06/21 PMH MS arthritis.    PT Comments    Patient progressing towards goals but continues to be limited by L LE weakness. Patient requires modA for ambulation primarily for balance and L LE management. Next session to attempt gait with ace wrap for DF assist on L LE. Patient requires increased time to complete tasks. Continue to recommend CIR level therapies to assist with maximizing functional mobility and safety.     Recommendations for follow up therapy are one component of a multi-disciplinary discharge planning process, led by the attending physician.  Recommendations may be updated based on patient status, additional functional criteria and insurance authorization.  Follow Up Recommendations  Acute inpatient rehab (3hours/day)     Assistance Recommended at Discharge Frequent or constant Supervision/Assistance  Equipment Recommendations  None recommended by PT    Recommendations for Other Services       Precautions / Restrictions Precautions Precautions: Fall Precaution Comments: RLE tone Restrictions Weight Bearing Restrictions: No     Mobility  Bed Mobility Overal bed mobility: Needs Assistance Bed Mobility: Supine to Sit     Supine to sit: Min assist     General bed mobility comments: Increased time and effort to complete. MinA for bringing L LE off bed. Able to elevate trunk with increased time and use of bed rail    Transfers Overall transfer level: Needs assistance Equipment used: Rolling Nollie Shiflett (2 wheels) Transfers: Sit to/from Stand Sit to Stand: Min assist;+2 safety/equipment           General transfer comment: minA for sit to stand from low bed surface    Ambulation/Gait Ambulation/Gait assistance: Mod assist;+2 safety/equipment Gait Distance  (Feet): 10 Feet (10) Assistive device: Rolling Jamareon Shimel (2 wheels) Gait Pattern/deviations: Decreased stride length;Decreased dorsiflexion - left;Shuffle;Trunk flexed;Step-to pattern Gait velocity: decreased     General Gait Details: modA for balance, guiding L LE into hip/knee flexion for each swing phase, and Rw management. Next session to wrap with ace bandage for DF assist.   Stairs             Wheelchair Mobility    Modified Rankin (Stroke Patients Only)       Balance Overall balance assessment: Needs assistance Sitting-balance support: Bilateral upper extremity supported Sitting balance-Leahy Scale: Fair     Standing balance support: Bilateral upper extremity supported;Single extremity supported;During functional activity;No upper extremity supported Standing balance-Leahy Scale: Poor Standing balance comment: reliant on AD and external assist                            Cognition Arousal/Alertness: Awake/alert Behavior During Therapy: WFL for tasks assessed/performed Overall Cognitive Status: No family/caregiver present to determine baseline cognitive functioning Area of Impairment: Memory;Problem solving;Safety/judgement                     Memory: Decreased short-term memory   Safety/Judgement: Decreased awareness of safety   Problem Solving: Slow processing;Decreased initiation;Difficulty sequencing          Exercises      General Comments General comments (skin integrity, edema, etc.): incision on back swollen but decreasing in size compared to lines marked on skin      Pertinent Vitals/Pain Pain Assessment: Faces Faces Pain Scale: Hurts little more Pain Location: surgery sites Pain Descriptors /  Indicators: Sore Pain Intervention(s): Monitored during session;Repositioned;Premedicated before session    Home Living                          Prior Function            PT Goals (current goals can now be found in  the care plan section) Acute Rehab PT Goals Patient Stated Goal: to return to independent PT Goal Formulation: With patient Time For Goal Achievement: 04/14/21 Potential to Achieve Goals: Good Progress towards PT goals: Progressing toward goals    Frequency    Min 4X/week      PT Plan Current plan remains appropriate    Co-evaluation              AM-PAC PT "6 Clicks" Mobility   Outcome Measure  Help needed turning from your back to your side while in a flat bed without using bedrails?: A Little Help needed moving from lying on your back to sitting on the side of a flat bed without using bedrails?: A Little Help needed moving to and from a bed to a chair (including a wheelchair)?: A Lot Help needed standing up from a chair using your arms (e.g., wheelchair or bedside chair)?: A Little Help needed to walk in hospital room?: A Lot Help needed climbing 3-5 steps with a railing? : Total 6 Click Score: 14    End of Session Equipment Utilized During Treatment: Gait belt Activity Tolerance: Patient tolerated treatment well Patient left: in chair;with call bell/phone within reach;with chair alarm set Nurse Communication: Mobility status PT Visit Diagnosis: Muscle weakness (generalized) (M62.81);Other symptoms and signs involving the nervous system (L84.536)     Time: 4680-3212 PT Time Calculation (min) (ACUTE ONLY): 53 min  Charges:  $Gait Training: 23-37 mins                     Gavin Faivre A. Dan Humphreys PT, DPT Acute Rehabilitation Services Pager [phone] Office [phone]    Viviann Spare 04/12/2021, 2:00 PM

## 2021-04-12 NOTE — Progress Notes (Signed)
Pharmacy Antibiotic Note  Sharon Odom is a 50 y.o. female admitted on 04/06/2021 with infected baclofen pump, removed in OR 11/10.  Pharmacy has been consulted for cefepime dosing for abdominal wound pocket cultures that grew Serratia marcescens.   Renal function stable, afebrile, WBC WNL  Plan: Cefepime 2gm IV Q8H Pharmacy will sign off with stable renal function.  Thank you for the consult!  Height: 5\' 9"  (175.3 cm) Weight: 57.2 kg (126 lb) IBW/kg (Calculated) : 66.2  Temp (24hrs), Avg:98.6 F (37 C), Min:98.4 F (36.9 C), Max:98.7 F (37.1 C)  Recent Labs  Lab 04/06/21 1543 04/07/21 0339 04/09/21 0944  WBC 8.0  --   --   CREATININE  --  0.73 0.78     Estimated Creatinine Clearance: 76.8 mL/min (by C-G formula based on SCr of 0.78 mg/dL).    Allergies  Allergen Reactions   Morphine Other (See Comments)    Pt does not recall reaction    Amoxicillin Nausea And Vomiting   Ziconotide Acetate     Anorexia, weird sensations, could not talk, choking feeling, lost since of taste and smell. Hallucinations, vivid dreams. Confusion and sleepiness. N/v, increase in pain.    Cefepime 11/13 >> Vanc 11/10 >> 11/13   11/10 abd wound - rare Serratia 11/15 UCx -   Annetta Deiss D. 12/15, PharmD, BCPS, BCCCP 04/12/2021, 12:42 PM

## 2021-04-12 NOTE — Progress Notes (Signed)
Inpatient Rehab Admissions Coordinator:   I do not have insurance auth or a bed on CIR for this Pt. Today. Pt. To reach out to other family to see who is able to assist with caring for Pt. After discharge. I will follow up with him today.   Megan Salon, MS, CCC-SLP Rehab Admissions Coordinator  [phone] (celll) [phone] (office)

## 2021-04-12 NOTE — Progress Notes (Signed)
Subjective: Patient reports that she had a good and uneventful night. She continues to report posterior incisional pain, as well as LLE and LUE spacticity. No acute events overnight.   Objective: Vital signs in last 24 hours: Temp:  [98.4 F (36.9 C)-98.7 F (37.1 C)] 98.7 F (37.1 C) (11/16 0734) Pulse Rate:  [74-89] 78 (11/16 0734) Resp:  [12-21] 21 (11/16 0734) BP: (93-112)/(54-73) 112/69 (11/16 0734) SpO2:  [91 %-99 %] 99 % (11/16 0734)  Intake/Output from previous day: 11/15 0701 - 11/16 0700 In: 363 [P.O.:360; I.V.:3] Out: 1300 [Urine:1300] Intake/Output this shift: Total I/O In: -  Out: 350 [Urine:350]    Physical Exam: Patient is awake, A/O X 4, conversant, and in good spirits. She is in NAD and VSS. Speech appropriate. MAEW. She has generalized weakness. Sensation to light touch is intact. PERLA, EOMI. CNs grossly intact. Face is symmetric. Dressings are clean dry intact. Incisions are well approximated with no drainage, erythema, or edema.     Lab Results: No results for input(s): WBC, HGB, HCT, PLT in the last 72 hours. BMET Recent Labs    04/09/21 0944  NA 133*  K 4.1  CL 102  CO2 23  GLUCOSE 156*  BUN 11  CREATININE 0.78  CALCIUM 8.7*    Studies/Results: No results found.  Assessment/Plan: Patient is postop s/p removal of baclofen pump, left lower quadrant due to baclofen pump infection on 04/06/2021. The patient continues to be high risk for baclofen withdrawal. Please continue to monitor the patient closely for sings of withdrawal. She is currently doing well and reports withdrawal type symptoms. Her neuro exam is stable and at her baseline. She continues to note mild spasticity mostly in her LUE and LLE. Posterolateral incision with mild peri incisional edema with no signs of CSF drainage. Appropriate incisional pain. Abdominal wound culture grew serratia marcescens which is sensitive to cefepime. No more complaints of UTI type symptoms. UA was  performed. PT/OT recommending CIR. Rehab consult has been placed. Continue working on pain management. Continue working with therapies and encouraging mobilization.     LOS: 6 days     Council Mechanic, DNP, NP-C 04/12/2021, 8:06 AM

## 2021-04-12 NOTE — PMR Pre-admission (Shared)
PMR Admission Coordinator Pre-Admission Assessment  Patient: Sharon Odom is an 50 y.o., female MRN: [id number] DOB: 04/19/1971 Height: 5' 9" (175.3 cm) Weight: 57.2 kg  Insurance Information HMO:     PPO: yes      PCP:      IPA:      80/20:      OTHER:  PRIMARY:BCBS Commercial     Policy#: [id number]      Subscriber: Pt CM Name: ***      Phone#: [phone]     Fax#: [phone] Pre-Cert#: [id number]      Employer:  Benefits:  Phone #:      Name:  Irene Shipper Date: 05/28/2016 - still active Deductible: $4,000 ($4,000 met) OOP Max:$8,000 ($8,841.66 met) CIR: 80% coverage, 20% co-insurance SNF: 80% coverage, 20% co-insurance limited to 130 combined days/cal yr(130 remaining) Outpatient:  80% coverage, 20% co-insurance Home Health:  80% coverage, 20% co-insurance; limited to 130 combined visits/cal yr (130 remaining) DME: 80% coverage, 20% co-insurance Providers: in network  SECONDARY:       Policy#:      Phone#:   Development worker, community:       Phone#:   [facility] [employer] for patients in Inpatient Rehabilitation Facilities with attached Privacy Act [place] Records was provided and verbally reviewed with: Patient  Emergency Contact Information Contact Information     Name Relation Home Work Mobile   Allene, Furuya Spouse   [phone]   Lauro Franklin Mother   [phone]       Current Medical History  Patient Admitting Diagnosis:Wound Infection History of Present Illness: Pt. Is This is a 50 year old female with a history of MS, status post baclofen pump replacement by Dr. Vertell Limber in September 2022.  She began to notice reddening and swelling along her pump site with warmth to touch.  She was placed on oral antibiotics.  The redness and swelling progressively worsened and there was some skin peeling but no drainage She presented for surgery to remove pump 04/06/21. Pt. Noted to have new deficits in functional mobility and ADLS, so CIR was consulted  to assist return to PLOF.      Patient's medical record from [facility] has been reviewed by the rehabilitation admission coordinator and physician.  Past Medical History  Past Medical History:  Diagnosis Date   Allergies    Anxiety    Arthritis    Asthma    seasonal - pollen   Constipation    Depression    Dyspnea    with exertion   GERD (gastroesophageal reflux disease)    Headache    History of hiatal hernia    Left radial nerve palsy 03/15/2020   resolved - Left arm/hand weak   Multiple sclerosis ([place])    Pneumonia 2007   x 1   Vision abnormalities    Wears glasses     Has the patient had major surgery during 100 days prior to admission? No  Family History   family history includes Diabetes in her mother; Healthy in her brother.  Current Medications  Current Facility-Administered Medications:    0.9 %  sodium chloride infusion, 250 mL, Intravenous, Continuous, Dawley, Troy C, DO, Last Rate: 1 mL/hr at 04/07/21 0139, 250 mL at 04/07/21 0139   acetaminophen (TYLENOL) tablet 650 mg, 650 mg, Oral, Q4H PRN, 650 mg at 04/08/21 0817 **OR** acetaminophen (TYLENOL) suppository 650 mg, 650 mg, Rectal, Q4H PRN, Dawley, Troy C, DO   albuterol (PROVENTIL) (2.5 MG/3ML) 0.083% nebulizer  solution 2.5 mg, 2.5 mg, Inhalation, Q6H PRN, Dawley, Troy C, DO °  alum & mag hydroxide-simeth (MAALOX/MYLANTA) 200-200-20 MG/5ML suspension 30 mL, 30 mL, Oral, Q6H PRN, McDaniel, Joshua L, NP °  baclofen (LIORESAL) tablet 40 mg, 40 mg, Oral, QID, Dawley, Troy C, DO, 40 mg at 04/11/21 2206 °  buPROPion (WELLBUTRIN XL) 24 hr tablet 300 mg, 300 mg, Oral, Daily, Dawley, Troy C, DO, 300 mg at 04/11/21 1103 °  ceFEPIme (MAXIPIME) 2 g in sodium chloride 0.9 % 100 mL IVPB, 2 g, Intravenous, Q8H, von Dohlen, Haley B, RPH, Last Rate: 200 mL/hr at 04/12/21 0556, 2 g at 04/12/21 0556 °  6 CHG cloth bath night before surgery, , , Once **AND** 6 CHG cloth bath AM of surgery, , , Once **AND**  [COMPLETED] Chlorhexidine Gluconate Cloth 2 % PADS 6 each, 6 each, Topical, Once, 6 each at 04/06/21 1545 **AND** Chlorhexidine Gluconate Cloth 2 % PADS 6 each, 6 each, Topical, Once, Dawley, Troy C, DO °  cyproheptadine (PERIACTIN) 4 MG tablet 4 mg, 4 mg, Oral, TID, Dawley, Troy C, DO, 4 mg at 04/11/21 2206 °  dalfampridine TB12 10 mg, 10 mg, Oral, BID, Dawley, Troy C, DO, 10 mg at 04/11/21 2206 °  darifenacin (ENABLEX) 24 hr tablet 7.5 mg, 7.5 mg, Oral, Daily, Dawley, Troy C, DO, 7.5 mg at 04/11/21 1101 °  diazepam (VALIUM) tablet 5 mg, 5 mg, Oral, Q8H, Dawley, Troy C, DO, 5 mg at 04/12/21 0556 °  famotidine (PEPCID) tablet 10 mg, 10 mg, Oral, QHS, Dawley, Troy C, DO, 10 mg at 04/11/21 2207 °  fiber (NUTRISOURCE FIBER) 1 packet, 1 packet, Per Tube, BID, Dawley, Troy C, DO, 1 packet at 04/11/21 2204 °  menthol-cetylpyridinium (CEPACOL) lozenge 3 mg, 1 lozenge, Oral, PRN **OR** phenol (CHLORASEPTIC) mouth spray 1 spray, 1 spray, Mouth/Throat, PRN, Dawley, Troy C, DO, 1 spray at 04/08/21 1242 °  montelukast (SINGULAIR) tablet 10 mg, 10 mg, Oral, Daily, Dawley, Troy C, DO, 10 mg at 04/11/21 1103 °  ondansetron (ZOFRAN) tablet 4 mg, 4 mg, Oral, Q6H PRN **OR** ondansetron (ZOFRAN) injection 4 mg, 4 mg, Intravenous, Q6H PRN, Dawley, Troy C, DO, 4 mg at 04/07/21 2100 °  oxyCODONE (Oxy IR/ROXICODONE) immediate release tablet 10 mg, 10 mg, Oral, Q4H PRN, Dawley, Troy C, DO, 10 mg at 04/11/21 1824 °  polyethylene glycol (MIRALAX / GLYCOLAX) packet 17 g, 17 g, Oral, Daily, Dawley, Troy C, DO °  polyvinyl alcohol (LIQUIFILM TEARS) 1.4 % ophthalmic solution 1 drop, 1 drop, Both Eyes, TID PRN, Dawley, Troy C, DO °  senna-docusate (Senokot-S) tablet 3 tablet, 3 tablet, Oral, Daily, Dawley, Troy C, DO, 3 tablet at 04/11/21 1102 °  sodium chloride flush (NS) 0.9 % injection 3 mL, 3 mL, Intravenous, Q12H, Dawley, Troy C, DO, 3 mL at 04/11/21 2208 °  sodium chloride flush (NS) 0.9 % injection 3 mL, 3 mL, Intravenous, PRN, Dawley,  Troy C, DO °  tiZANidine (ZANAFLEX) tablet 4 mg, 4 mg, Oral, TID, Cabbell, Kyle, MD, 4 mg at 04/11/21 2207 °  zolpidem (AMBIEN) tablet 10 mg, 10 mg, Oral, QHS PRN, Dawley, Troy C, DO, 10 mg at 04/10/21 2149 ° °Patients Current Diet:  °Diet Order   ° °       °  Diet regular Room service appropriate? Yes; Fluid consistency: Thin  Diet effective now       °  ° °  °  ° °  ° ° °Precautions / Restrictions °Precautions °  Precautions: Fall °Precaution Comments: RLE tone °Restrictions °Weight Bearing Restrictions: No  ° °Has the patient had 2 or more falls or a fall with injury in the past year? No ° °Prior Activity Level °Limited Community (1-2x/wk): Pt. mostly went out for appoitnemtns ° °Prior Functional Level °Self Care: Did the patient need help bathing, dressing, using the toilet or eating? Independent ° °Indoor Mobility: Did the patient need assistance with walking from room to room (with or without device)? Independent ° °Stairs: Did the patient need assistance with internal or external stairs (with or without device)? Needed some help ° °Functional Cognition: Did the patient need help planning regular tasks such as shopping or remembering to take medications? Independent ° °Patient Information °Are you of Hispanic, Latino/a,or Spanish origin?: A. No, not of Hispanic, Latino/a, or Spanish origin °What is your race?: A. White °Do you need or want an interpreter to communicate with a doctor or health care staff?: 1. Yes ° °Patient's Response To:  °Health Literacy and Transportation °Is the patient able to respond to health literacy and transportation needs?: Yes °Health Literacy - How often do you need to have someone help you when you read instructions, pamphlets, or other written material from your doctor or pharmacy?: Never °In the past 12 months, has lack of transportation kept you from medical appointments or from getting medications?: No °In the past 12 months, has lack of transportation kept you from meetings,  work, or from getting things needed for daily living?: No ° °Home Assistive Devices / Equipment °Home Assistive Devices/Equipment: Wheelchair, Bedside commode/3-in-1, Hand-held shower hose, Grab bars in shower, Shower chair with back, Walker (specify type), Eyeglasses °Home Equipment: Grab bars - tub/shower, Grab bars - toilet, Rolling Walker (2 wheels), Rollator (4 wheels), Wheelchair - manual, Wheelchair - power, Shower seat - built in, Adaptive equipment ° °Prior Device Use: Indicate devices/aids used by the patient prior to current illness, exacerbation or injury? Walker ° °Current Functional Level °Cognition ° Overall Cognitive Status: No family/caregiver present to determine baseline cognitive functioning °Orientation Level: Oriented X4 °Safety/Judgement: Decreased awareness of safety °General Comments: possibly with some baseline deficits, no family present °   °Extremity Assessment °(includes Sensation/Coordination) ° Upper Extremity Assessment: LUE deficits/detail °LUE Deficits / Details: pt using hand over hand to position L hand during session on RW and to reach for bed rail to (A) with transfer. pt decreased digit extension. pt noted to have resting hand splint in room ( prefabricated blue hanger resting hand splint) No family present to inquire further about hand splinting. °LUE Coordination: decreased fine motor, decreased gross motor  °Lower Extremity Assessment: RLE deficits/detail, LLE deficits/detail °RLE Deficits / Details: able to lift antigravity in sitting, but with some increased tone noted, AROM generally WFL, but weak ankle DF °RLE Sensation: decreased light touch °LLE Deficits / Details: unable to lift antigravity without help, increased tone noted and drags with ambulation °LLE Sensation: decreased light touch  °  °ADLs ° Overall ADL's : Needs assistance/impaired °Eating/Feeding: Set up, Sitting °Eating/Feeding Details (indicate cue type and reason): pt with prolonged time to eat less than  50% of food. pt allowed >1 hr to have lunch. °Grooming: Minimal assistance °Upper Body Bathing: Minimal assistance °Lower Body Bathing: Maximal assistance °Lower Body Dressing: Maximal assistance °Toilet Transfer: Maximal assistance, Rolling walker (2 wheels), BSC/3in1 °Toilet Transfer Details (indicate cue type and reason): pt require extended time with RW to transfer to commode. pt requires therapist to forward progress LLE every step. °Toileting- Clothing Manipulation   and Hygiene: Min guard, Sitting/lateral lean Toileting - Clothing Manipulation Details (indicate cue type and reason): pt self reporting feeling she has a UTI based on odor. Rn made aware and present Functional mobility during ADLs: Maximal assistance, Rolling walker (2 wheels) General ADL Comments: pt unable to advance LLE during session. pt weight shifting to R LE and reports LLE feels like a magnet to the floor. pt states that the sock is part of the issue. Sock was doff for stand pivot to bed to try and slide foot with same demo.    Mobility  Overal bed mobility: Needs Assistance Bed Mobility: Supine to Sit Rolling: Mod assist Supine to sit: Mod assist Sit to supine: Mod assist General bed mobility comments: assist for trunk and LE management L>R, pt using leg lifter to assist. Increased time and effort, especially to scoot to EOB    Transfers  Overall transfer level: Needs assistance Equipment used: Rolling walker (2 wheels) Transfers: Sit to/from Stand, Bed to chair/wheelchair/BSC Sit to Stand: Min assist Bed to/from chair/wheelchair/BSC transfer type:: Stand pivot Stand pivot transfers: Min assist Step pivot transfers: Min guard General transfer comment: min assist for rise, steadying, pivot to [facility]. STS x2, from EOB and BSC.    Ambulation / Gait / Stairs / Wheelchair Mobility  Ambulation/Gait Ambulation/Gait assistance: Mod assist Gait Distance (Feet): 30 Feet Assistive device: Rolling walker (2 wheels) Gait  Pattern/deviations: Decreased stride length, Decreased dorsiflexion - left, Shuffle, Trunk flexed, Step-to pattern General Gait Details: mod assist to steady, guide LLE into hip/knee flexion and DF, PT utilizing leg lifter to DF assist and provide swing phase assist. Cues for sequencing Gait velocity: decr    Posture / Balance Dynamic Sitting Balance Sitting balance - Comments: leaning over to don her pants despite cues to let PT help to avoid increased pain from surgery Balance Overall balance assessment: Needs assistance Sitting-balance support: Bilateral upper extremity supported Sitting balance-Leahy Scale: Fair Sitting balance - Comments: leaning over to don her pants despite cues to let PT help to avoid increased pain from surgery Standing balance support: Bilateral upper extremity supported, Single extremity supported, During functional activity, No upper extremity supported Standing balance-Leahy Scale: Poor Standing balance comment: reliant on AD, PT steadying    Special needs/care consideration Skin surgical incision   Previous Home Environment (from acute therapy documentation) Living Arrangements: Spouse/significant other Available Help at Discharge: Family, Available PRN/intermittently Type of Home: House Home Layout: Two level, Able to live on main level with bedroom/bathroom Alternate Level Stairs-Number of Steps: full flight Home Access: Other (comment) Bathroom Shower/Tub: Other (comment) Bathroom Toilet: Handicapped height Bathroom Accessibility: Yes How Accessible: Accessible via wheelchair, Accessible via walker [place]: No Additional Comments: reports baseline uses 4ww for transfers. pt has adaptive utensils to eat and noted increased trouble with L UE  Discharge Living Setting Plans for Discharge Living Setting: Patient's home Type of Home at Discharge: House Discharge Home Layout: Two level, Able to live on main level with  bedroom/bathroom Alternate Level Stairs-Number of Steps: full flight Discharge Home Access: Stairs to enter Entrance Stairs-Rails: Right Discharge Bathroom Shower/Tub: Tub/shower unit Discharge Bathroom Toilet: Handicapped height Discharge Bathroom Accessibility: Yes How Accessible: Accessible via walker, Accessible via wheelchair Does the patient have any problems obtaining your medications?: No  Social/Family/Support Systems Patient Roles: Other (Comment), Spouse Husband works but her mother is going to come for a week, then husband off a week, then in laws will come to provide assist at home.Everyone can handle min  A Contact Information: [phone] Anticipated Caregiver: Shanon Brow Anticipated Caregiver's Contact Information: [phone] Caregiver Availability: Evenings only Discharge Plan Discussed with Primary Caregiver: Yes Is Caregiver In Agreement with Plan?: Yes Does Caregiver/Family have Issues with Lodging/Transportation while Pt is in Rehab?: Yes  Goals Patient/Family Goal for Rehab: PT/OT Supervision Expected length of stay: 16-18 days Pt/Family Agrees to Admission and willing to participate: Yes Program Orientation Provided & Reviewed with Pt/Caregiver Including Roles  & Responsibilities: Yes  Decrease burden of Care through IP rehab admission: Specialzed equipment needs, Decrease number of caregivers, Bowel and bladder program, and Patient/family education  Possible need for SNF placement upon discharge: not anticipated  Patient Condition: I have reviewed medical records from [facility], spoken with CM, and patient and spouse. I met with patient at the bedside for inpatient rehabilitation assessment.  Patient will benefit from ongoing PT and OT, can actively participate in 3 hours of therapy a day 5 days of the week, and can make measurable gains during the admission.  Patient will also benefit from the coordinated team approach during an Inpatient Acute  Rehabilitation admission.  The patient will receive intensive therapy as well as Rehabilitation physician, nursing, social worker, and care management interventions.  Due to safety, skin/wound care, disease management, medication administration, pain management, and patient education the patient requires 24 hour a day rehabilitation nursing.  The patient is currently *** with mobility and basic ADLs.  Discharge setting and therapy post discharge at home with home health is anticipated.  Patient has agreed to participate in the Acute Inpatient Rehabilitation Program and will admit {Time; today/tomorrow:10263}.  Preadmission Screen Completed By:  Genella Mech, 04/12/2021 9:17 AM ______________________________________________________________________   Discussed status with Dr. Marland Kitchen on *** at *** and received approval for admission today.  Admission Coordinator:  Genella Mech, CCC-SLP, time Marland KitchenSudie Grumbling ***   Assessment/Plan: Diagnosis: Does the need for close, 24 hr/day Medical supervision in concert with the patient's rehab needs make it unreasonable for this patient to be served in a less intensive setting? {yes_no_potentially:3041433} Co-Morbidities requiring supervision/potential complications: *** Due to {due YI:9485462}, does the patient require 24 hr/day rehab nursing? {yes_no_potentially:3041433} Does the patient require coordinated care of a physician, rehab nurse, PT, OT, and SLP to address physical and functional deficits in the context of the above medical diagnosis(es)? {yes_no_potentially:3041433} Addressing deficits in the following areas: {deficits:3041436} Can the patient actively participate in an intensive therapy program of at least 3 hrs of therapy 5 days a week? {yes_no_potentially:3041433} The potential for patient to make measurable gains while on inpatient rehab is {potential:3041437} Anticipated functional outcomes upon discharge from inpatient rehab: {functional  outcomes:304600100} PT, {functional outcomes:304600100} OT, {functional outcomes:304600100} SLP Estimated rehab length of stay to reach the above functional goals is: *** Anticipated discharge destination: {anticipated dc setting:21604} 10. Overall Rehab/Functional Prognosis: {potential:3041437}   MD Signature: ***

## 2021-04-13 MED ORDER — HEPARIN SODIUM (PORCINE) 5000 UNIT/ML IJ SOLN
5000.0000 [IU] | Freq: Three times a day (TID) | INTRAMUSCULAR | Status: DC
  Administered 2021-04-13 – 2021-04-15 (×7): 5000 [IU] via SUBCUTANEOUS
  Filled 2021-04-13 (×7): qty 1

## 2021-04-13 MED ORDER — GLYCERIN (LAXATIVE) 2 G RE SUPP
1.0000 | Freq: Once | RECTAL | Status: AC
  Administered 2021-04-13: 14:00:00 1 via RECTAL
  Filled 2021-04-13: qty 1

## 2021-04-13 NOTE — Progress Notes (Signed)
Physical Therapy Treatment Patient Details Name: Sharon Odom MRN: [id number] DOB: [birth year] Today's Date: 04/13/2021   History of Present Illness 50 yo female admitted for bacolfen pump removal 04/06/21 PMH MS arthritis.    PT Comments    Patient progressing towards physical therapy goals. Patient continues to be limited by L LE weakness and tone. Use of Ace wrap on L LE for assisting with DF during ambulation. Patient ambulated 68' with RW and modA. Continues to require assist for advancing L LE but able to initiate movement. Patient became tearful about current situation at end of session. Continue to recommend CIR level therapies to assist with maximizing functional mobility and safety.     Recommendations for follow up therapy are one component of a multi-disciplinary discharge planning process, led by the attending physician.  Recommendations may be updated based on patient status, additional functional criteria and insurance authorization.  Follow Up Recommendations  Acute inpatient rehab (3hours/day)     Assistance Recommended at Discharge Frequent or constant Supervision/Assistance  Equipment Recommendations  None recommended by PT    Recommendations for Other Services       Precautions / Restrictions Precautions Precautions: Fall Precaution Comments: RLE tone Restrictions Weight Bearing Restrictions: No     Mobility  Bed Mobility Overal bed mobility: Needs Assistance Bed Mobility: Supine to Sit     Supine to sit: Min assist     General bed mobility comments: increased time required to complete. MinA for bringing LLE off bed with use of leg lifter    Transfers Overall transfer level: Needs assistance Equipment used: Rolling Annalaura Sauseda (2 wheels) Transfers: Sit to/from Stand Sit to Stand: Min assist           General transfer comment: minA to stand from low bed surface x 2    Ambulation/Gait Ambulation/Gait assistance: Mod assist;+2  safety/equipment Gait Distance (Feet): 40 Feet Assistive device: Rolling Kashif Pooler (2 wheels) Gait Pattern/deviations: Decreased stride length;Decreased dorsiflexion - left;Shuffle;Trunk flexed;Step-to pattern Gait velocity: decreased     General Gait Details: modA for guiding L LE into hip/knee flexion and balance. Use of ace wrap to assist with L ankle DF due to foot drop. Patient able to initiate L LE advancement but difficult to overcome tone requiring assist for placement/advancement   Stairs             Wheelchair Mobility    Modified Rankin (Stroke Patients Only)       Balance Overall balance assessment: Needs assistance Sitting-balance support: Bilateral upper extremity supported Sitting balance-Leahy Scale: Fair     Standing balance support: Bilateral upper extremity supported;During functional activity Standing balance-Leahy Scale: Poor Standing balance comment: reliant on AD and external assist                            Cognition Arousal/Alertness: Awake/alert Behavior During Therapy: WFL for tasks assessed/performed Overall Cognitive Status: No family/caregiver present to determine baseline cognitive functioning Area of Impairment: Memory;Problem solving;Safety/judgement                     Memory: Decreased short-term memory   Safety/Judgement: Decreased awareness of safety   Problem Solving: Slow processing;Decreased initiation;Difficulty sequencing          Exercises      General Comments        Pertinent Vitals/Pain Pain Assessment: Faces Faces Pain Scale: Hurts little more Pain Location: surgery sites Pain Descriptors / Indicators: Sore Pain Intervention(s): Monitored  during session;Repositioned    Home Living                          Prior Function            PT Goals (current goals can now be found in the care plan section) Acute Rehab PT Goals Patient Stated Goal: to return to independent PT  Goal Formulation: With patient Time For Goal Achievement: 04/14/21 Potential to Achieve Goals: Good Progress towards PT goals: Progressing toward goals    Frequency    Min 4X/week      PT Plan Current plan remains appropriate    Co-evaluation              AM-PAC PT "6 Clicks" Mobility   Outcome Measure  Help needed turning from your back to your side while in a flat bed without using bedrails?: A Little Help needed moving from lying on your back to sitting on the side of a flat bed without using bedrails?: A Little Help needed moving to and from a bed to a chair (including a wheelchair)?: A Lot Help needed standing up from a chair using your arms (e.g., wheelchair or bedside chair)?: A Little Help needed to walk in hospital room?: A Lot Help needed climbing 3-5 steps with a railing? : Total 6 Click Score: 14    End of Session Equipment Utilized During Treatment: Gait belt Activity Tolerance: Patient tolerated treatment well Patient left: in chair;with call bell/phone within reach;with chair alarm set Nurse Communication: Mobility status PT Visit Diagnosis: Muscle weakness (generalized) (M62.81);Other symptoms and signs involving the nervous system (Z61.096)     Time: 0454-0981 PT Time Calculation (min) (ACUTE ONLY): 39 min  Charges:  $Gait Training: 23-37 mins $Therapeutic Activity: 8-22 mins                     Delaney Perona A. Dan Humphreys PT, DPT Acute Rehabilitation Services Pager [phone] Office [phone]    Viviann Spare 04/13/2021, 5:04 PM

## 2021-04-13 NOTE — Progress Notes (Signed)
Subjective: Patient reports that she had a good night and is doing well overall. Her posterior incision and spasticity continue to be her largest complaint. Posterior incision pain has continued to improve since surgery. No acute events overnight.   Objective: Vital signs in last 24 hours: Temp:  [97.7 F (36.5 C)-99.1 F (37.3 C)] 98.7 F (37.1 C) (11/17 0739) Pulse Rate:  [74-83] 74 (11/17 0739) Resp:  [15-22] 16 (11/17 0739) BP: (96-110)/(45-73) 96/45 (11/17 0739) SpO2:  [95 %-100 %] 97 % (11/17 0739)  Intake/Output from previous day: 11/16 0701 - 11/17 0700 In: 865.9 [P.O.:240; IV Piggyback:625.9] Out: 1050 [Urine:1050] Intake/Output this shift: No intake/output data recorded.   Physical Exam: Patient is awake, A/O X 4, conversant, and in good spirits. She is in NAD and VSS. Speech appropriate and dysarthric which is her baseline. MAEW. She has generalized weakness in her BUE and BLE. Sensation to light touch is intact. PERLA, EOMI. CNs grossly intact. Face is symmetric. Dressings are clean dry intact. Incisions are well approximated with no drainage, erythema, or edema.    Lab Results: No results for input(s): WBC, HGB, HCT, PLT in the last 72 hours. BMET No results for input(s): NA, K, CL, CO2, GLUCOSE, BUN, CREATININE, CALCIUM in the last 72 hours.  Studies/Results: No results found.  Assessment/Plan: Patient is postop s/p removal of baclofen pump, left lower quadrant due to baclofen pump infection on 04/06/2021. The patient continues to improve and is doing well. Continue to monitor the patient closely for sings of withdrawal from baclofen. She reports no withdrawal type symptoms. Her neuro exam is stable and at her baseline. She continues to note mild spasticity mostly in her LUE and LLE. Posterior incision with mild peri incisional edema with no signs of CSF drainage, improving. Appropriate incisional pain. Abdominal wound culture grew serratia marcescens which is  sensitive to cefepime. Continue cefepime. No more complaints of UTI type symptoms. PT/OT recommending CIR. Rehab consult has been placed. Inpatient Rehab Admissions Coordinator does not currently have insurance auth or a bed on CIR for this Pt. Patient stated she has a brother who is coming into town tomorrow and her mother is coming into town next week. She stated her family would be amenable to helping her rehab at home. Rehab Admissions Coordinator to follow up with patient today. Continue working on pain management. Continue working with therapies and encouraging mobilization.     LOS: 7 days     Council Mechanic, DNP, NP-C 04/13/2021, 8:10 AM

## 2021-04-14 NOTE — Progress Notes (Addendum)
Inpatient Rehab Admissions Coordinator:   I do not have insurance auth or a bed for this Pt. On CIR today. Pt. States she prefers to go home rather than wait.I spoke with  her husband and he is also in agreement to take her home. Notified TOC and MD.   Megan Salon, MS, CCC-SLP Rehab Admissions Coordinator  [phone] (celll) [phone] (office)

## 2021-04-14 NOTE — Progress Notes (Signed)
Occupational Therapy Treatment Patient Details Name: Sharon Odom MRN: [id number] DOB: [birth year] Today's Date: 04/14/2021   History of present illness 50 yo female admitted for bacolfen pump removal 04/06/21 PMH MS arthritis.   OT comments  Pt. Seen for skilled OT session with focus on self feeding and integration of use of L hand into tasks.  Pt. Moving slow but able to take bites, and sips without spillage.  Good accuracy with bringing desired object to mouth.  Reviewed importance of ROM of B UES to allow for continued use during ADLS.  Current recommendations remain appropriate.     Recommendations for follow up therapy are one component of a multi-disciplinary discharge planning process, led by the attending physician.  Recommendations may be updated based on patient status, additional functional criteria and insurance authorization.    Follow Up Recommendations  Acute inpatient rehab (3hours/day)    Assistance Recommended at Discharge Intermittent Supervision/Assistance  Equipment Recommendations  None recommended by OT    Recommendations for Other Services      Precautions / Restrictions Precautions Precautions: Fall Precaution Comments: RLE tone       Mobility Bed Mobility                    Transfers                         Balance                                           ADL either performed or assessed with clinical judgement   ADL Overall ADL's : Needs assistance/impaired Eating/Feeding: Minimal assistance;Bed level Eating/Feeding Details (indicate cue type and reason): pt. reports needing assistance to peel back lid on yogurt.  self feeding in bed with use of reg. fork in R hand. states "my left hand is pretty much useless" states when she tries to engage it for use it flexes and cramps and will remain in that position the rest of the day.  slow to bring fork to mouth but able to without spills or issues noted.  encouraged  use of L hand. pt. able to reach to small  bowl and pull an apple slice from the bowl and bring to mouth without issue.  able to use B hands to hold cup and bring to mouth.  used only L hand to place back on tray table. without spilliage.  increased time for tasks but no spillage noted     Upper Body Bathing: Bed level Upper Body Bathing Details (indicate cue type and reason): pt. layed hob flat do demo how she has to shave her L arm pit, states she is unable to raise L arm high enough to reach her arm pit effectively in the shower.  reviewed rom and stretches to aide in increasing rom.  pt. already using R arm to help guide L arm into flexion to attempt to reach her arm pit                                Extremity/Trunk Assessment              Vision       Perception     Praxis      Cognition Arousal/Alertness: Awake/alert Behavior During Therapy: [facility] for tasks  assessed/performed Overall Cognitive Status: No family/caregiver present to determine baseline cognitive functioning                                            Exercises     Shoulder Instructions       General Comments      Pertinent Vitals/ Pain       Pain Score: 5  Pain Location: back Pain Descriptors / Indicators: Aching;Sore Pain Intervention(s): Limited activity within patient's tolerance;Monitored during session  Home Living                                          Prior Functioning/Environment              Frequency  Min 2X/week        Progress Toward Goals  OT Goals(current goals can now be found in the care plan section)  Progress towards OT goals: Progressing toward goals     Plan Discharge plan needs to be updated    Co-evaluation                 AM-PAC OT "6 Clicks" Daily Activity     Outcome Measure   Help from another person eating meals?: A Little Help from another person taking care of personal grooming?: A  Little Help from another person toileting, which includes using toliet, bedpan, or urinal?: A Lot Help from another person bathing (including washing, rinsing, drying)?: A Little Help from another person to put on and taking off regular upper body clothing?: A Little Help from another person to put on and taking off regular lower body clothing?: A Little 6 Click Score: 17    End of Session    OT Visit Diagnosis: Unsteadiness on feet (R26.81);Muscle weakness (generalized) (M62.81);Pain Pain - Right/Left: Left   Activity Tolerance Patient tolerated treatment well   Patient Left in bed;with call bell/phone within reach   Nurse Communication          Time: 6269-4854 OT Time Calculation (min): 21 min  Charges: OT General Charges $OT Visit: 1 Visit OT Treatments $Self Care/Home Management : 8-22 mins  Boneta Lucks, COTA/L Acute Rehabilitation [phone]   Sharon Odom 04/14/2021, 8:54 AM

## 2021-04-14 NOTE — Progress Notes (Signed)
Subjective: Patient reports that she feels like she is continuing to improve and is feeling stronger. She stated that she would prefer to go home instead of going to CIR. No acute events overnight.   Objective: Vital signs in last 24 hours: Temp:  [97.6 F (36.4 C)-99.4 F (37.4 C)] 98.8 F (37.1 C) (11/18 0737) Pulse Rate:  [72-75] 72 (11/18 0737) Resp:  [16-20] 17 (11/18 0737) BP: (100-111)/(54-86) 101/54 (11/18 0737) SpO2:  [97 %-98 %] 98 % (11/18 0737)  Intake/Output from previous day: 11/17 0701 - 11/18 0700 In: -  Out: 1700 [Urine:1700] Intake/Output this shift: No intake/output data recorded.  Physical Exam: Patient is awake, A/O X 4, conversant, and in good spirits. She is in NAD and VSS. Speech appropriate and dysarthric which is her baseline. MAEW. Generalized weakness in her BUE and BLE. Sensation to light touch is intact. PERLA, EOMI. CNs grossly intact. Face is symmetric. Dressings are clean dry intact. Incisions are well approximated with no drainage, erythema, or edema.     Lab Results: No results for input(s): WBC, HGB, HCT, PLT in the last 72 hours. BMET No results for input(s): NA, K, CL, CO2, GLUCOSE, BUN, CREATININE, CALCIUM in the last 72 hours.  Studies/Results: No results found.  Assessment/Plan: Patient is postop s/p removal of baclofen pump, left lower quadrant due to baclofen pump infection on 04/06/2021. The patient continues to improve and is increasing her activity. Continue to monitor the patient closely for sings of withdrawal from baclofen. No withdrawal type symptoms reported this morning. Her neuro exam remains stable and at her baseline. Mild spasticity mostly in her LUE and LLE persists. Posterior incision with mild peri incisional edema with no signs of CSF drainage, continuing to improve. Appropriate incisional pain. Abdominal wound culture grew serratia marcescens which is sensitive to cefepime. Continue cefepime. PT/OT continuing to recommend  CIR. Rehab Admissions Coordinator currently working on placement. Patient stated that her brother is in town tomorrow and her mother is coming into town next week. She stated her family would be amenable to helping her rehab at home with assistance from home health. Rehab Admissions Coordinator to follow up with patient. Continue working on pain management. Continue working with therapies and encouraging mobilization.     LOS: 8 days     Council Mechanic, DNP, NP-C 04/14/2021, 8:18 AM

## 2021-04-14 NOTE — Discharge Summary (Signed)
Physician Discharge Summary  Patient ID: Sharon Odom MRN: [id number] DOB/AGE: 1971/01/19 50 y.o.  Admit date: 04/06/2021 Discharge date: 04/15/2021  Admission Diagnoses: Multiple sclerosis, Infected baclofen pump  Discharge Diagnoses: Multiple sclerosis, Infected baclofen pump Active Problems:   Wound infection after surgery   Discharged Condition: good  Hospital Course: Sharon Odom is a 50 y.o. female with a history of MS that had a baclofen pump replacement in September 2022 and began having swelling, erythema, peeling of her skin and warm to touch around her incision.  She was placed on oral antibiotics but did not help significantly and the swelling began to progress.  Therefore, with concern for infection, it was recommended to remove the baclofen pump. She was admitted om 04/06/2021 and taken to the operating room where the patient underwent removal of her baclofen pump in her LLQ. The patient tolerated the procedure well and was taken to the recovery room and then to the floor in stable condition. There were no complications. The wound remained clean dry and intact. Pt had appropriate abdominal and back soreness. Following surgery, she continued antibiotics and began working with therapies. During her admission, she was monitored closely for baclofen withdrawal. She had generalized weakness and increased spasticity. The patient continued to increase activities, and pain was well controlled with oral pain medications. PT/OT recommend CIR for the patient. Rehab admissions was consulted and were working on insurance approval and bed placement. At this time, she is ready to be discharged, however, the rehab facility was not scheduled to have a bed available for at least 4 more days. The patient stated that she has adequate help at home and requested to be discharged home with home health.    Consults: None  Significant Diagnostic Studies: N/A  Treatments: antibiotics: cefepime, therapies:  PT and OT, and surgery: Removal of baclofen pump, left lower quadrant  Discharge Exam: Blood pressure 113/63, pulse 81, temperature 98.9 F (37.2 C), temperature source Oral, resp. rate 18, height 5\' 9"  (1.753 m), weight 57.2 kg, SpO2 99 %.  Physical Exam: Patient is awake, A/O X 4, conversant, and in good spirits. She is in NAD and VSS. Speech appropriate and dysarthric which is her baseline. MAEW. Generalized weakness in her BUE and BLE. Sensation to light touch is intact. PERLA, EOMI. CNs grossly intact. Face is symmetric. Dressings are clean dry intact. Incisions are well approximated with no drainage, erythema, or edema.   Disposition: Discharge disposition: 01-Home or Self Care      Discharge Instructions     Ambulatory referral to Home Health   Complete by: As directed    Please evaluate Sharon Odom for admission to [facility].  Disciplines requested: Nursing, Physical Therapy, and Occupational Therapy  Services to provide: [facility], Strengthening Exercises, and Evaluate  Physician to follow patient's care (the person listed here will be responsible for signing ongoing orders): Referring Provider  Requested Start of Care Date: Tomorrow  I certify that this patient is under my care and that I, or a Nurse Practitioner or Physician's Assistant working with me, had a face-to-face encounter that meets the physician face-to-face requirements with patient on 04/14/2021. The encounter with the patient was in whole, or in part for the following medical condition(s) which is the primary reason for home health care (List medical condition). Multiple sclerosis, Infected baclofen pump s/p removal of baclofen pump, unsteadiness on feet, generalized muscle weakness   Special Instructions:   Does the patient have Medicare or Medicaid?: No   The  encounter with the patient was in whole, or in part, for the following medical condition, which is the primary reason for home health care:  Multiple sclerosis, Infected baclofen pump s/p removal of baclofen pump   Reason for Medically Necessary Home Health Services:  Skilled Nursing- Post-Surgical Wound Assessment and Care Therapy- Therapeutic Exercises to Increase Strength and Endurance Skilled Nursing- Assessment and Observation of Wound Status Skilled Nursing- Teaching of Disease Process/Symptom Management Therapy- Investment banker, operational, Patent examiner Therapy- Home Adaptation to Facilitate Safety     My clinical findings support the need for the above services:  Can transfer bed to chair only Pain interferes with ambulation/mobility Unable to leave home safely without assistance and/or assistive device     I certify that, based on my findings, the following services are medically necessary home health services:  Nursing Physical therapy     Further, I certify that my clinical findings support that this patient is homebound due to:  Ambulates short distances less than 300 feet Can transfer bed to chair only Pain interferes with ambulation/mobility Unable to leave home safely without assistance     Call MD for:  redness, tenderness, or signs of infection (pain, swelling, redness, odor or green/yellow discharge around incision site)   Complete by: As directed    Call MD for:  severe uncontrolled pain   Complete by: As directed    Call MD for:  temperature >100.4   Complete by: As directed    Diet - low sodium heart healthy   Complete by: As directed    Discharge wound care:   Complete by: As directed    Keep dressing intact . Follow up with Dr. Jake Odom next week.   Increase activity slowly   Complete by: As directed       Allergies as of 04/15/2021       Reactions   Morphine Other (See Comments)   Pt does not recall reaction    Amoxicillin Nausea And Vomiting   Ziconotide Acetate    Anorexia, weird sensations, could not talk, choking feeling, lost since of taste and smell. Hallucinations, vivid  dreams. Confusion and sleepiness. N/v, increase in pain.         Medication List     STOP taking these medications    clindamycin 300 MG capsule Commonly known as: CLEOCIN       TAKE these medications    acetaminophen 500 MG tablet Commonly known as: TYLENOL Take 1,000 mg by mouth every 6 (six) hours as needed (pain).   albuterol 108 (90 Base) MCG/ACT inhaler Commonly known as: VENTOLIN HFA Inhale 1-2 puffs into the lungs every 6 (six) hours as needed for wheezing or shortness of breath.   baclofen 20 MG tablet Commonly known as: LIORESAL Take 2 tablets (40 mg total) by mouth 4 (four) times daily. What changed:  how much to take how to take this when to take this additional instructions   Benefiber Powd Take 3.5 Scoops by mouth 2 (two) times daily.   buPROPion 300 MG 24 hr tablet Commonly known as: WELLBUTRIN XL TAKE 1 TABLET DAILY   carboxymethylcellulose 0.5 % Soln Commonly known as: REFRESH PLUS Place 1 drop into both eyes 3 (three) times daily as needed (dry eyes).   ciprofloxacin 500 MG tablet Commonly known as: CIPRO Take 1 tablet (500 mg total) by mouth 2 (two) times daily for 7 days.   cyproheptadine 4 MG tablet Commonly known as: PERIACTIN Take 1 tablet (4 mg  total) by mouth 3 (three) times daily.   dalfampridine 10 MG Tb12 Take 1 tablet (10 mg total) by mouth 2 (two) times daily. One po bid   diazepam 5 MG tablet Commonly known as: VALIUM Take 1 tablet (5 mg total) by mouth every 8 (eight) hours.   diphenhydramine-acetaminophen 25-500 MG Tabs tablet Commonly known as: TYLENOL PM Take 2 tablets by mouth at bedtime.   Lemtrada 12 MG/1.2ML Soln Generic drug: Alemtuzumab Inject 12 mg into the vein See admin instructions. Once a year   montelukast 10 MG tablet Commonly known as: SINGULAIR TAKE 1 TABLET DAILY   Oxycodone HCl 10 MG Tabs Take 1 tablet (10 mg total) by mouth every 4 (four) hours as needed.   oxymetazoline 0.05 % nasal  spray Commonly known as: AFRIN Place 1 spray into both nostrils 2 (two) times daily as needed for congestion.   polyethylene glycol 17 g packet Commonly known as: MIRALAX / GLYCOLAX Take 17 g by mouth daily as needed for moderate constipation.   senna-docusate 8.6-50 MG tablet Commonly known as: Senokot-S Take 1 tablet by mouth daily. What changed: how much to take   tiZANidine 4 MG tablet Commonly known as: ZANAFLEX Take 1 tablet (4 mg total) by mouth 3 (three) times daily. What changed: how much to take   trospium 20 MG tablet Commonly known as: SANCTURA Take 20 mg by mouth 2 (two) times daily.   Vitamin B-12 2500 MCG Subl Place 1 tablet (2,500 mcg total) under the tongue daily.   Vitamin D-3 125 MCG (5000 UT) Tabs Take 5,000 Units by mouth daily.   ZANTAC PO Take 10 mg by mouth daily.               Discharge Care Instructions  (From admission, onward)           Start     Ordered   04/15/21 0000  Discharge wound care:       Comments: Keep dressing intact . Follow up with Dr. Jake Odom next week.   04/15/21 1619            Follow-up Information     Dawley, Troy C, DO Follow up.   Why: Follow up on 04/19/2021 for suture and staple removal Contact information: [address] [facility] [place] [address] [phone]         Care, [facility] Follow up.   Why: They will contact you for start of care Contact information: [address] [place] [address] [phone]                 Signed: Stefani Dama, DNP, NP-C 04/15/2021, 4:20 PM

## 2021-04-14 NOTE — TOC Initial Note (Signed)
Transition of Care (TOC) - Initial/Assessment Note  Marvetta Gibbons RN,BSN Transitions of Care Unit 4NP (Non Trauma)- RN Case Manager See Treatment Team for direct Phone #    Patient Details  Name: Sharon Odom MRN: [id number] Date of Birth: December 31, 1970  Transition of Care [facility]) CM/SW Contact:    Dawayne Patricia, RN Phone Number: 04/14/2021, 4:40 PM  Clinical Narrative:                 Received msg from [facility] that insurance auth still pending and no bed available at this time. Pt does not want to continue to wait for CIR and has decided she wants to return home.  CM in to speak with pt to confirm transition needs. Per pt she has needed DME at home- no new DME needs at this time. Choice offered for [facility] needs- Per CMS guidelines from medicare.gov website with star ratings (copy placed in shadow chart) - per pt she has used both [place] and [facility] in the past- pt states she wants to use [facility] again for [facility] needs this time-   Family to transport home.  Address, phone #s, and PCP all confirmed with pt.   Patient also asked about private duty agencies- info provided for options in her area and how to search for agencies in her area to f/u on herself.   Call made to [facility] with [facility] for [facility] referral- referral has been accepted- Pt will need [place] orders for RN/PT/OT  Expected Discharge Plan: IP Rehab Facility Barriers to Discharge: Insurance Authorization   Patient Goals and CMS Choice Patient states their goals for this hospitalization and ongoing recovery are:: rehab CMS Medicare.gov Compare Post Acute Care list provided to:: Patient Choice offered to / list presented to : Patient  Expected Discharge Plan and Services Expected Discharge Plan: [place]   Discharge Planning Services: CM Consult Post Acute Care Choice: Durable Medical Equipment, Home Health Living arrangements for the past 2 months: Single Family Home                 DME Arranged: N/A DME Agency:  NA       HH Arranged: RN, PT, OT HH Agency: [place] Date HH Agency Contacted: 04/14/21 Time HH Agency Contacted: 43 Representative spoke with at [place]: Sharmon Revere  Prior Living Arrangements/Services Living arrangements for the past 2 months: [place] Lives with:: Spouse Patient language and need for interpreter reviewed:: Yes Do you feel safe going back to the place where you live?: Yes      Need for Family Participation in Patient Care: Yes (Comment) Care giver support system in place?: Yes (comment) Current home services: DME Criminal Activity/Legal Involvement Pertinent to Current Situation/Hospitalization: No - Comment as needed  Activities of Daily Living Home Assistive Devices/Equipment: Wheelchair, Bedside commode/3-in-1, Hand-held shower hose, Grab bars in shower, Shower chair with back, Environmental consultant (specify type), Eyeglasses ADL Screening (condition at time of admission) Patient's cognitive ability adequate to safely complete daily activities?: No Is the patient deaf or have difficulty hearing?: No Does the patient have difficulty seeing, even when wearing glasses/contacts?: No Does the patient have difficulty concentrating, remembering, or making decisions?: Yes Patient able to express need for assistance with ADLs?: No Does the patient have difficulty dressing or bathing?: Yes Independently performs ADLs?: Yes (appropriate for developmental age) Does the patient have difficulty walking or climbing stairs?: Yes Weakness of Legs: Both Weakness of Arms/Hands: Both  Permission Sought/Granted Permission sought  to share information with : Facility Industrial/product designer granted to share information with : Yes, Verbal Permission Granted     Permission granted to share info w AGENCY: HH        Emotional Assessment Appearance:: Appears stated age Attitude/Demeanor/Rapport: Engaged Affect (typically observed): Accepting,  Appropriate, Pleasant Orientation: : Oriented to Self, Oriented to Place, Oriented to  Time, Oriented to Situation Alcohol / Substance Use: Not Applicable Psych Involvement: No (comment)  Admission diagnosis:  Wound infection after surgery [T81.49XA] Patient Active Problem List   Diagnosis Date Noted   Wound infection after surgery 04/06/2021   Mechanical complication of device 12/21/2020   Wheelchair dependence 12/16/2020   Left arm weakness 07/15/2020   Left radial nerve palsy 03/15/2020   Delusions (HCC) 03/15/2020   Hoarseness 12/14/2019   Dysarthria 12/14/2019   Dysesthesia 09/03/2019   Low serum vitamin B12 06/08/2019   Low serum vitamin D 06/08/2019   Vitamin deficiency related neuropathy (HCC) 06/08/2019   Multiple sclerosis (HCC) 05/13/2019   Steroid-induced hyperglycemia    Hypoalbuminemia due to protein-calorie malnutrition (HCC)    Neurogenic bowel    Asthma 04/02/2019   Constipated 04/02/2019   Spell of generalized weakness 03/31/2019   Acute weakness 03/31/2019   Presence of intrathecal baclofen pump 03/19/2019   Dysphagia 04/14/2018   Spastic diplegia, acquired, lower extremity (HCC) 10/16/2017   Muscle spasticity 08/15/2017   Gait disturbance 08/15/2017   Depression with anxiety 08/15/2017   Diplopia 08/15/2017   Multiple sclerosis exacerbation (HCC) 03/04/2017   High risk medication use 02/06/2017   PCP:  Annita Brod, MD Pharmacy:   [id number] - [place], [place] - [address] [facility] [address] [place] [address] Phone: [phone] Fax: [phone]  [facility] [id number] - [place], [place] - [address] [place] [address] Phone: [phone] Fax: [phone]  [facility] [place], [place] - [address] [facility] [address] [place] [address] Phone: [phone] Fax: [phone]  [facility] - [place], [place] -  [address] [place] [address] Phone: [phone] Fax: [phone]  [facility] [id number] - [place], [place] - [address] [place] [address] Phone: [phone] Fax: [phone]  [facility] - [place], [place] - [address] [place]  [address] Phone: [phone] Fax: [phone]     Social Determinants of Health (SDOH) Interventions    Readmission Risk Interventions No flowsheet data found.

## 2021-04-15 MED ORDER — TIZANIDINE HCL 4 MG PO TABS: 4.0000 mg | ORAL_TABLET | Freq: Three times a day (TID) | ORAL | 0 refills | Status: DC

## 2021-04-15 MED ORDER — DIAZEPAM 5 MG PO TABS: 5.0000 mg | ORAL_TABLET | Freq: Three times a day (TID) | ORAL | 0 refills | Status: DC

## 2021-04-15 MED ORDER — CIPROFLOXACIN HCL 500 MG PO TABS: 500.0000 mg | ORAL_TABLET | Freq: Two times a day (BID) | ORAL | 0 refills | Status: AC

## 2021-04-15 MED ORDER — CYPROHEPTADINE HCL 4 MG PO TABS: 4.0000 mg | ORAL_TABLET | Freq: Three times a day (TID) | ORAL | 0 refills | Status: DC

## 2021-04-15 MED ORDER — BACLOFEN 20 MG PO TABS
40.0000 mg | ORAL_TABLET | Freq: Four times a day (QID) | ORAL | 0 refills | Status: DC
Start: 2021-04-15 — End: 2021-04-17

## 2021-04-15 NOTE — Progress Notes (Signed)
Patient ID: Sharon Odom, female   DOB: February 22, 1971, 50 y.o.   MRN: [id number] Patient is doing reasonably well.  She has not ambulated today and would like to do so with physical therapy before being discharged home.  If all is well we can discharge her later today.

## 2021-04-15 NOTE — Progress Notes (Signed)
Pt discharge education and instructions completed with pt at beside. Pt voices understanding and denies any questions. Pt IV and telemetry removed prior to discharge. Pt to pick up prescribed medications from preferred pharmacy on file. Pt incision dressings remain clean, dry and intact. Unremarkable. Pt discharged home with spouse to transport her to disposition. Pt transported off unit via her personal wheelchair with belongings to the side. Sharon Merles Shawndrea Rutkowski RN.

## 2021-04-15 NOTE — Progress Notes (Signed)
Physical Therapy Treatment Patient Details Name: Sharon Odom MRN: [id number] DOB: 1971-05-19 Today's Date: 04/15/2021   History of Present Illness 50 yo female admitted for bacolfen pump removal 04/06/21 PMH MS arthritis.    PT Comments    Session focused on gait training to/from bathroom with L AFO for potential discharge home this date. Patient requires minA for ambulation with RW and L AFO for balance. Improved ability to advance L LE with use of AFO. Patient motivated to return to independence and works hard. Educated patient on use of BSC due to increased time required to ambulate to/from bathroom , patient verbalized understanding. Updated d/c recommendation to HHPT and frequent supervision/assist at home. No DME needs required as patient owns necessary DME.    Recommendations for follow up therapy are one component of a multi-disciplinary discharge planning process, led by the attending physician.  Recommendations may be updated based on patient status, additional functional criteria and insurance authorization.  Follow Up Recommendations  Home health PT     Assistance Recommended at Discharge Frequent or constant Supervision/Assistance  Equipment Recommendations  None recommended by PT (patient owns necessary DME)    Recommendations for Other Services       Precautions / Restrictions Precautions Precautions: Fall Precaution Comments: LLE tone, LLE AFO in room Restrictions Weight Bearing Restrictions: No     Mobility  Bed Mobility Overal bed mobility: Needs Assistance Bed Mobility: Supine to Sit     Supine to sit: Min assist     General bed mobility comments: increased time required to perform bed mobility. MinA for L LE advancement and use of leg lifter by patient    Transfers Overall transfer level: Needs assistance Equipment used: Rolling Kimari Coudriet (2 wheels) Transfers: Sit to/from Stand Sit to Stand: Min assist           General transfer comment: minA  to power up into standing from EOB and toilet x 4 during session    Ambulation/Gait Ambulation/Gait assistance: Min assist Gait Distance (Feet): 10 Feet (10) Assistive device: Rolling Edman Lipsey (2 wheels) Gait Pattern/deviations: Decreased stride length;Decreased dorsiflexion - left;Shuffle;Trunk flexed;Step-to pattern Gait velocity: decreased     General Gait Details: use of patient's L AFO for ambulation with improved ability to advance L LE. MinA for balance. Difficulty going over threshold in bathroom. Patient determined to be able to ambulate to bathroom prior to returning home this date   Stairs             Wheelchair Mobility    Modified Rankin (Stroke Patients Only)       Balance Overall balance assessment: Needs assistance Sitting-balance support: Single extremity supported Sitting balance-Leahy Scale: Fair     Standing balance support: Bilateral upper extremity supported;During functional activity Standing balance-Leahy Scale: Poor Standing balance comment: reliant on AD and external assist                            Cognition Arousal/Alertness: Awake/alert Behavior During Therapy: WFL for tasks assessed/performed Overall Cognitive Status: No family/caregiver present to determine baseline cognitive functioning Area of Impairment: Memory;Problem solving;Safety/judgement                     Memory: Decreased short-term memory   Safety/Judgement: Decreased awareness of safety   Problem Solving: Slow processing;Decreased initiation;Difficulty sequencing          Exercises      General Comments        Pertinent  Vitals/Pain Pain Assessment: Faces Faces Pain Scale: Hurts little more Pain Location: back Pain Descriptors / Indicators: Aching;Sore Pain Intervention(s): Monitored during session;Repositioned    Home Living                          Prior Function            PT Goals (current goals can now be found in  the care plan section) Acute Rehab PT Goals Patient Stated Goal: to return to independence PT Goal Formulation: With patient Time For Goal Achievement: 04/29/21 Potential to Achieve Goals: Good Progress towards PT goals: Progressing toward goals    Frequency    Min 4X/week      PT Plan Discharge plan needs to be updated    Co-evaluation              AM-PAC PT "6 Clicks" Mobility   Outcome Measure  Help needed turning from your back to your side while in a flat bed without using bedrails?: A Little Help needed moving from lying on your back to sitting on the side of a flat bed without using bedrails?: A Little Help needed moving to and from a bed to a chair (including a wheelchair)?: A Little Help needed standing up from a chair using your arms (e.g., wheelchair or bedside chair)?: A Little Help needed to walk in hospital room?: A Little Help needed climbing 3-5 steps with a railing? : Total 6 Click Score: 16    End of Session Equipment Utilized During Treatment: Gait belt;Other (comment) (L AFO) Activity Tolerance: Patient tolerated treatment well Patient left: in chair;with call bell/phone within reach;with chair alarm set Nurse Communication: Mobility status PT Visit Diagnosis: Muscle weakness (generalized) (M62.81);Other symptoms and signs involving the nervous system (R29.898)     Time: [id number] PT Time Calculation (min) (ACUTE ONLY): 43 min  Charges:  $Gait Training: 38-52 mins                     Seanpatrick Maisano A. Gilford Rile PT, DPT Acute Rehabilitation Services Pager [phone] Office [phone]    Linna Hoff 04/15/2021, 2:28 PM

## 2021-04-17 ENCOUNTER — Encounter: Payer: Self-pay | Admitting: Physical Medicine and Rehabilitation

## 2021-04-17 ENCOUNTER — Encounter
Payer: BLUE CROSS/BLUE SHIELD | Attending: Physical Medicine and Rehabilitation | Admitting: Physical Medicine and Rehabilitation

## 2021-04-17 ENCOUNTER — Other Ambulatory Visit: Payer: Self-pay

## 2021-04-17 VITALS — BP 120/72 | HR 91 | Ht 69.0 in | Wt 125.0 lb

## 2021-04-17 DIAGNOSIS — R29898 Other symptoms and signs involving the musculoskeletal system: Secondary | ICD-10-CM | POA: Diagnosis present

## 2021-04-17 DIAGNOSIS — M79602 Pain in left arm: Secondary | ICD-10-CM | POA: Diagnosis present

## 2021-04-17 DIAGNOSIS — G35 Multiple sclerosis: Secondary | ICD-10-CM | POA: Diagnosis present

## 2021-04-17 DIAGNOSIS — T8149XA Infection following a procedure, other surgical site, initial encounter: Secondary | ICD-10-CM | POA: Diagnosis present

## 2021-04-17 MED ORDER — BACLOFEN 20 MG PO TABS: 40.0000 mg | ORAL_TABLET | Freq: Four times a day (QID) | ORAL | 5 refills | Status: DC

## 2021-04-17 MED ORDER — TIZANIDINE HCL 4 MG PO TABS: 8.0000 mg | ORAL_TABLET | Freq: Three times a day (TID) | ORAL | 5 refills | Status: DC

## 2021-04-17 MED ORDER — DIAZEPAM 5 MG PO TABS: 7.5000 mg | ORAL_TABLET | Freq: Three times a day (TID) | ORAL | 1 refills | Status: DC

## 2021-04-17 NOTE — Progress Notes (Signed)
Subjective:    Patient ID: Sharon Odom, female    DOB: [birth year], 50 y.o.   MRN: [id number]  HPI  Patient is a 50 yr old female with secondary progressive MS with L>R spasticity s/p ITB pump Here for f/u. Hoarseness AND dysarthria; not just 1 issue. Insomnia and ITB refills  ITB pump removed due to pump infection 11/10 by Dr Jake Samples- here for f/u on spasticity.   Was discharged from hospital Saturday 04/15/21.  Didn't want to come to inpt rehab at the time due to timing.   Was having a lot of pain in LUE and hand- making a claw- was moving more when went into hospital.  Was constant- and had to increase Senokot and benefiber as well as Miralax.     Tizanidine 4 mg TID- 8 mg 3x/day at home.  Baclofen- 40 mg 3x/day Valium 5 mg 3x/day- is taking it Cyproheptadine 4 mg-3x/day-   On 2 PO ABX, but appears to be only Cipro  R side is trembling and then whole body is trembling Can hardly move the L leg.  Making it much harder to move overall.  Will be walking with RW, and whole body is trembling and then cannot walk. Head goes and everything.   When had spasms in hospital, hurt so bad, had tears down her cheeks.   Was told had UTI in hospital- U/A has moderate bacteria/leuks on U/A but Cx grew nothing.   Feels like has UTI still- when finishes peeing, is still painful. Not peeing much-    Back to using the w/c in house since ITB pump is out.  "Is slower than sloth".    Pain Inventory Average Pain 5 Pain Right Now 3 My pain is  .  LOCATION OF PAIN  neck wrist hand fingers back thigh leg ankle toes  BOWEL Number of stools per week: 5-7 Oral laxative use Yes  Type of laxative miralax senokot   BLADDER Normal Frequent urination Yes    Mobility walk with assistance use a walker how many minutes can you walk? 3-5 ability to climb steps?  yes do you drive?  no use a wheelchair  Function disabled: date disabled . I need assistance with the following:  meal prep,  household duties, and shopping  Neuro/Psych bladder control problems weakness numbness tremor tingling trouble walking spasms depression  Prior Studies Any changes since last visit?  no  Physicians involved in your care Any changes since last visit?  no   Family History  Problem Relation Age of Onset   Diabetes Mother    Healthy Brother    Social History   Socioeconomic History   Marital status: Married    Spouse name: Not on file   Number of children: Not on file   Years of education: Not on file   Highest education level: Not on file  Occupational History   Not on file  Tobacco Use   Smoking status: Every Day    Packs/day: 0.50    Years: 32.00    Pack years: 16.00    Types: Cigarettes    Start date: 1988   Smokeless tobacco: Never  Vaping Use   Vaping Use: Former   Quit date: 05/29/2011  Substance and Sexual Activity   Alcohol use: Not Currently    Comment: 2-3 times per wk   Drug use: Never   Sexual activity: Not Currently    Birth control/protection: Post-menopausal  Other Topics Concern   Not on file  Social  History Narrative   Right handed    Caffeine: 1 cup or less per day- coffee   [employer]   Social Determinants of Health   Financial Resource Strain: Not on file  Food Insecurity: Not on file  Transportation Needs: Not on file  Physical Activity: Not on file  Stress: Not on file  Social Connections: Not on file   Past Surgical History:  Procedure Laterality Date   EXCISION MORTON'S NEUROMA Right    fallopian tube removal     INTRATHECAL PUMP IMPLANT Right 02/18/2019   Procedure: INTRATHECAL PUMP IMPLANT;  Surgeon: Odette Fraction, MD;  [facility] OR;  Service: Neurosurgery;  Laterality: Right;  INTRATHECAL PUMP IMPLANT   INTRATHECAL PUMP IMPLANT Right 02/17/2021   Procedure: Baclofen Pump Replacement;  Surgeon: Maeola Harman, MD;  Location: [facility] OR;  Service: Neurosurgery;  Laterality: Right;  3C/RM 21   PAIN PUMP REMOVAL Left 04/06/2021    Procedure: Removal of baclofen pump, Left;  Surgeon: Dawley, Alan Mulder, DO;  Location: [facility] OR;  Service: Neurosurgery;  Laterality: Left;  Lateral/Left//3C rm 19   UPPER GI ENDOSCOPY     WISDOM TOOTH EXTRACTION     WISDOM TOOTH EXTRACTION     Past Medical History:  Diagnosis Date   Allergies    Anxiety    Arthritis    Asthma    seasonal - pollen   Constipation    Depression    Dyspnea    with exertion   GERD (gastroesophageal reflux disease)    Headache    History of hiatal hernia    Left radial nerve palsy 03/15/2020   resolved - Left arm/hand weak   Multiple sclerosis (HCC)    Pneumonia 2007   x 1   Vision abnormalities    Wears glasses    BP 120/72   Pulse 91   Ht 5\' 9"  (1.753 m)   Wt 125 lb (56.7 kg) Comment: reported  SpO2 96%   BMI 18.46 kg/m   Opioid Risk Score:   Fall Risk Score:  `1  Depression screen PHQ 2/9  Depression screen [facility] 2/9 04/17/2021 12/21/2020 08/08/2020 02/08/2020 12/14/2019 09/07/2019 04/27/2019  Decreased Interest 0 0 1 1 0 1 1  Down, Depressed, Hopeless 0 0 1 1 0 1 1  PHQ - 2 Score 0 0 2 2 0 2 2  Altered sleeping - - - - - - 1  Tired, decreased energy - - - - - - 1  Change in appetite - - - - - - 1  Feeling bad or failure about yourself  - - - - - - 0  Trouble concentrating - - - - - - 1  Moving slowly or fidgety/restless - - - - - - 2  Suicidal thoughts - - - - - - 0  PHQ-9 Score - - - - - - 8  Difficult doing work/chores - - - - - - Very difficult     Review of Systems  Constitutional: Negative.   HENT: Negative.    Eyes: Negative.   Respiratory: Negative.    Cardiovascular: Negative.   Gastrointestinal: Negative.   Endocrine: Negative.   Genitourinary:  Positive for frequency.       Bladder control  Musculoskeletal:  Positive for back pain and gait problem.       Spasms  Skin: Negative.   Neurological:  Positive for tremors, weakness and numbness.       Tingling  Hematological: Negative.   Psychiatric/Behavioral:  Positive  for dysphoric mood. The patient is nervous/anxious.   All other systems reviewed and are negative.     Objective:   Physical Exam Awake, alert, dysarthria chronic- but voice quieter and more dysarthric, in manual w/c; accompanied by husband, NAD Neuro: MAS of 2 in knees and hips B/L ;  [place] is place on abdomen and back- covered- but no erythema seen at all- however on back is very swollen.       Assessment & Plan:    Patient is a 50 yr old female with secondary progressive MS with L>R spasticity s/p ITB pump Here for f/u. Hoarseness AND dysarthria; not just 1 issue. Insomnia and ITB refills  ITB pump removed due to pump infection 11/10 by Dr Jake Samples- here for f/u on spasticity.    Until done with Cipro- is an antibiotic, cannot increase Zanaflex/Tizanidine. Have to take at 2 hours apart from each other. Once have stopped Cipro- then we can increase Zanaflex/Tizanidine back to 8 mg 3x/day.   2. For bladder pain- Pyridium/Azo- can turn your pee orange- 100 mg 3x/day-  for 2-3 days- is an over the counter medicine- can get at pharmacy.    3. Bladder spasms/pain- Also thinks she's having bladder spasms, which is more causing her symptoms of UTI than an actual UTI. If still having bladder spasms, give me a call- and can use Urispas?   4. Wean the Cyproheptadine/Periactin- for baclofen withdrawal- start tomorrow- to 4 mg 2x/day x 1 week, then nightly x 1 week, then stop. Keep the extra, just in case!  5.  Con't Baclofen 40 mg 3x/day- for spasticity.  6.  Will try to figure out how to treat spasms/trembling- will increase Valium to 7.5 mg 3x/day. If too sleepy/sedated- will need to drop back to 5 mg 3x/day  7. Have to be careful to use pain meds LESS- because can potentiate/make pain meds stronger, which puts pt at risk for  respiratory depression- which is dangerous.   8. Has had H/H ordered by [facility]-not clear if has H/H aide- paperwork hard to read.  Don't want ot cancel the H/H  that's already in computer.   9. Sees [facility] Wednesday. 9am- will need to get to appointment per [facility].   10.  Warned pt that might be 8 week to 6 months until ITB pump can be put back in.    11. F/U - has appt 06/16/21. Already on books   I spent a total of 43 minutes on visit- as detailed above- specifically going over prognosis and plan esp spasticity

## 2021-04-17 NOTE — Progress Notes (Signed)
   Subjective:    Patient ID: Sharon Odom, female    DOB: [birth year], 51 y.o.   MRN: [id number]  HPI    Review of Systems     Objective:   Physical Exam        Assessment & Plan:

## 2021-04-17 NOTE — Patient Instructions (Signed)
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 Hoarseness AND dysarthria; not just 1 issue. Insomnia and ITB refills  ITB pump removed due to pump infection 11/10 by Dr Jake Samples- here for f/u on spasticity.    Until done with Cipro- is an antibiotic, cannot increase Zanaflex/Tizanidine. Have to take at 2 hours apart from each other. Once have stopped Cipro- then we can increase Zanaflex/Tizanidine back to 8 mg 3x/day.   2. For bladder pain- Pyridium/Azo- can turn your pee orange- 100 mg 3x/day-  for 2-3 days- is an over the counter medicine- can get at pharmacy.    3. Bladder spasms/pain- Also thinks she's having bladder spasms, which is more causing her symptoms of UTI than an actual UTI. If still having bladder spasms, give me a call- and can use Urispas?   4. Wean the Cyproheptadine/Periactin- for baclofen withdrawal- start tomorrow- to 4 mg 2x/day x 1 week, then nightly x 1 week, then stop. Keep the extra, just in case!  5.  Con't Baclofen 40 mg 3x/day- for spasticity.  6.  Will try to figure out how to treat spasms/trembling- will increase Valium to 7.5 mg 3x/day. If too sleepy/sedated- will need to drop back to 5 mg 3x/day  7. Have to be careful to use pain meds LESS- because can potentiate/make pain meds stronger, which puts pt at risk for  respiratory depression- which is dangerous.   8. Has had H/H ordered by [facility]-not clear if has H/H aide- paperwork hard to read.  Don't want ot cancel the H/H that's already in computer.   9. Sees [facility] Wednesday. 9am- will need to get to appointment per [facility].   10.  Warned pt that might be 8 week to 6 months until ITB pump can be put back in.    11. F/U - has appt 06/16/21. Already on books

## 2021-04-25 ENCOUNTER — Encounter: Payer: Self-pay | Admitting: Physical Medicine and Rehabilitation

## 2021-05-03 ENCOUNTER — Telehealth: Payer: Self-pay

## 2021-05-03 NOTE — Telephone Encounter (Signed)
Cecelia, PT from [facility] called requesting verbal orders for HHPT 1wk1, 2wk4 HHAide 1wk1, 3wk2, 2wk2 and HHST eval. Is this okay?  I do not see a discharge summary from hospital.

## 2021-05-04 ENCOUNTER — Encounter: Payer: Self-pay | Admitting: Physical Medicine and Rehabilitation

## 2021-05-05 NOTE — Telephone Encounter (Signed)
Gave verbal orders to [place] from [facility] .

## 2021-05-11 ENCOUNTER — Telehealth: Payer: Self-pay

## 2021-05-11 NOTE — Telephone Encounter (Signed)
Theora Gianotti, ST  called to request verbal orders for speech therapy.  1x/week for 2 weeks 0 for 1 week 1x/week for 2 weeks Verbal orders given

## 2021-06-02 ENCOUNTER — Telehealth: Payer: Self-pay

## 2021-06-02 ENCOUNTER — Encounter: Payer: Self-pay | Admitting: Physical Medicine and Rehabilitation

## 2021-06-02 NOTE — Telephone Encounter (Signed)
Cecelia,PT from [facility] called requesting extension HHPT 2wk1, 1wk3. Orders approved and given.

## 2021-06-06 ENCOUNTER — Telehealth: Payer: Self-pay

## 2021-06-06 NOTE — Telephone Encounter (Signed)
Okay given for In-home Speech Therapy once a week for 4 weeks. Verbal given to Jamison Oka with [facility] [phone]).

## 2021-06-08 ENCOUNTER — Emergency Department (HOSPITAL_COMMUNITY): Payer: BLUE CROSS/BLUE SHIELD

## 2021-06-08 ENCOUNTER — Encounter: Payer: Self-pay | Admitting: Physical Medicine and Rehabilitation

## 2021-06-08 ENCOUNTER — Observation Stay (HOSPITAL_COMMUNITY)
Admission: EM | Admit: 2021-06-08 | Discharge: 2021-06-09 | Disposition: A | Discharge status: Home / Self Care | Payer: BLUE CROSS/BLUE SHIELD | Attending: Internal Medicine | Admitting: Internal Medicine

## 2021-06-08 ENCOUNTER — Encounter (HOSPITAL_COMMUNITY): Payer: Self-pay

## 2021-06-08 ENCOUNTER — Other Ambulatory Visit: Payer: Self-pay

## 2021-06-08 DIAGNOSIS — R531 Weakness: Secondary | ICD-10-CM | POA: Diagnosis present

## 2021-06-08 DIAGNOSIS — G35D Multiple sclerosis, unspecified: Secondary | ICD-10-CM | POA: Diagnosis present

## 2021-06-08 DIAGNOSIS — N3000 Acute cystitis without hematuria: Secondary | ICD-10-CM | POA: Diagnosis not present

## 2021-06-08 DIAGNOSIS — Z20822 Contact with and (suspected) exposure to covid-19: Secondary | ICD-10-CM | POA: Diagnosis not present

## 2021-06-08 DIAGNOSIS — Z79899 Other long term (current) drug therapy: Secondary | ICD-10-CM | POA: Diagnosis not present

## 2021-06-08 DIAGNOSIS — G35 Multiple sclerosis: Secondary | ICD-10-CM

## 2021-06-08 DIAGNOSIS — E860 Dehydration: Secondary | ICD-10-CM | POA: Diagnosis present

## 2021-06-08 DIAGNOSIS — F17210 Nicotine dependence, cigarettes, uncomplicated: Secondary | ICD-10-CM | POA: Insufficient documentation

## 2021-06-08 DIAGNOSIS — F418 Other specified anxiety disorders: Secondary | ICD-10-CM | POA: Diagnosis present

## 2021-06-08 DIAGNOSIS — J4520 Mild intermittent asthma, uncomplicated: Secondary | ICD-10-CM | POA: Diagnosis present

## 2021-06-08 DIAGNOSIS — R32 Unspecified urinary incontinence: Secondary | ICD-10-CM | POA: Diagnosis present

## 2021-06-08 DIAGNOSIS — A419 Sepsis, unspecified organism: Secondary | ICD-10-CM | POA: Diagnosis present

## 2021-06-08 LAB — CBC WITH DIFFERENTIAL/PLATELET
Abs Immature Granulocytes: 0.1 10*3/uL — ABNORMAL HIGH (ref 0.00–0.07)
Basophils Absolute: 0 10*3/uL (ref 0.0–0.1)
Basophils Relative: 0 %
Eosinophils Absolute: 0 10*3/uL (ref 0.0–0.5)
Eosinophils Relative: 0 %
HCT: 37.2 % (ref 36.0–46.0)
Hemoglobin: 12.5 g/dL (ref 12.0–15.0)
Immature Granulocytes: 1 %
Lymphocytes Relative: 5 %
Lymphs Abs: 0.8 10*3/uL (ref 0.7–4.0)
MCH: 31.6 pg (ref 26.0–34.0)
MCHC: 33.6 g/dL (ref 30.0–36.0)
MCV: 94.2 fL (ref 80.0–100.0)
Monocytes Absolute: 1.1 10*3/uL — ABNORMAL HIGH (ref 0.1–1.0)
Monocytes Relative: 7 %
Neutro Abs: 13.6 10*3/uL — ABNORMAL HIGH (ref 1.7–7.7)
Neutrophils Relative %: 87 %
Platelets: 196 10*3/uL (ref 150–400)
RBC: 3.95 MIL/uL (ref 3.87–5.11)
RDW: 12.8 % (ref 11.5–15.5)
WBC: 15.5 10*3/uL — ABNORMAL HIGH (ref 4.0–10.5)
nRBC: 0 % (ref 0.0–0.2)

## 2021-06-08 LAB — URINALYSIS, MICROSCOPIC (REFLEX)

## 2021-06-08 LAB — BASIC METABOLIC PANEL
Anion gap: 10 (ref 5–15)
BUN: 12 mg/dL (ref 6–20)
CO2: 22 mmol/L (ref 22–32)
Calcium: 9 mg/dL (ref 8.9–10.3)
Chloride: 106 mmol/L (ref 98–111)
Creatinine, Ser: 0.79 mg/dL (ref 0.44–1.00)
GFR, Estimated: >60 mL/min (ref >60)
Glucose, Bld: 105 mg/dL — ABNORMAL HIGH (ref 70–99)
Potassium: 3.6 mmol/L (ref 3.5–5.1)
Sodium: 138 mmol/L (ref 135–145)

## 2021-06-08 LAB — URINALYSIS, ROUTINE W REFLEX MICROSCOPIC
Bilirubin Urine: NEGATIVE
Glucose, UA: NEGATIVE mg/dL
Hgb urine dipstick: NEGATIVE
Ketones, ur: NEGATIVE mg/dL
Nitrite: NEGATIVE
Protein, ur: NEGATIVE mg/dL
Specific Gravity, Urine: 1.02 (ref 1.005–1.030)
pH: 7 (ref 5.0–8.0)

## 2021-06-08 LAB — LACTIC ACID, PLASMA: Lactic Acid, Venous: 0.9 mmol/L (ref 0.5–1.9)

## 2021-06-08 LAB — RESP PANEL BY RT-PCR (FLU A&B, COVID) ARPGX2
Influenza A by PCR: NEGATIVE
Influenza B by PCR: NEGATIVE
SARS Coronavirus 2 by RT PCR: NEGATIVE

## 2021-06-08 LAB — MAGNESIUM: Magnesium: 1.7 mg/dL (ref 1.7–2.4)

## 2021-06-08 MED ORDER — DIAZEPAM 2 MG PO TABS
2.0000 mg | ORAL_TABLET | Freq: Three times a day (TID) | ORAL | Status: DC
  Administered 2021-06-08 – 2021-06-09 (×3): 2 mg via ORAL
  Filled 2021-06-08 (×3): qty 1

## 2021-06-08 MED ORDER — ACETAMINOPHEN 325 MG PO TABS
650.0000 mg | ORAL_TABLET | Freq: Four times a day (QID) | ORAL | Status: DC | PRN
  Administered 2021-06-08 – 2021-06-09 (×2): 650 mg via ORAL
  Filled 2021-06-08 (×2): qty 2

## 2021-06-08 MED ORDER — MONTELUKAST SODIUM 10 MG PO TABS
10.0000 mg | ORAL_TABLET | Freq: Every day | ORAL | Status: DC
  Administered 2021-06-08 – 2021-06-09 (×2): 10 mg via ORAL
  Filled 2021-06-08 (×2): qty 1

## 2021-06-08 MED ORDER — BACLOFEN 10 MG PO TABS
40.0000 mg | ORAL_TABLET | Freq: Three times a day (TID) | ORAL | Status: DC
  Administered 2021-06-08 – 2021-06-09 (×6): 40 mg via ORAL
  Filled 2021-06-08: qty 2
  Filled 2021-06-08 (×2): qty 4
  Filled 2021-06-08 (×3): qty 2
  Filled 2021-06-08: qty 4
  Filled 2021-06-08: qty 2

## 2021-06-08 MED ORDER — GADOBUTROL 1 MMOL/ML IV SOLN
6.0000 mL | Freq: Once | INTRAVENOUS | Status: AC | PRN
  Administered 2021-06-08: 6 mL via INTRAVENOUS

## 2021-06-08 MED ORDER — SODIUM CHLORIDE 0.9 % IV BOLUS
1000.0000 mL | Freq: Once | INTRAVENOUS | Status: AC
  Administered 2021-06-08: 1000 mL via INTRAVENOUS

## 2021-06-08 MED ORDER — MIDODRINE HCL 5 MG PO TABS
5.0000 mg | ORAL_TABLET | Freq: Three times a day (TID) | ORAL | Status: DC
  Administered 2021-06-08 – 2021-06-09 (×3): 5 mg via ORAL
  Filled 2021-06-08 (×3): qty 1

## 2021-06-08 MED ORDER — TIZANIDINE HCL 4 MG PO TABS
8.0000 mg | ORAL_TABLET | Freq: Three times a day (TID) | ORAL | Status: DC
  Administered 2021-06-08 – 2021-06-09 (×3): 8 mg via ORAL
  Filled 2021-06-08 (×3): qty 2

## 2021-06-08 MED ORDER — NICOTINE 14 MG/24HR TD PT24
14.0000 mg | MEDICATED_PATCH | Freq: Every day | TRANSDERMAL | Status: DC | PRN
  Filled 2021-06-08: qty 1

## 2021-06-08 MED ORDER — CYPROHEPTADINE HCL 4 MG PO TABS
4.0000 mg | ORAL_TABLET | Freq: Three times a day (TID) | ORAL | Status: DC
  Administered 2021-06-08: 4 mg via ORAL
  Filled 2021-06-08 (×3): qty 1

## 2021-06-08 MED ORDER — SODIUM CHLORIDE 0.9 % IV SOLN: INTRAVENOUS | Status: DC

## 2021-06-08 MED ORDER — BUPROPION HCL ER (XL) 150 MG PO TB24
300.0000 mg | ORAL_TABLET | Freq: Every day | ORAL | Status: DC
  Administered 2021-06-08 – 2021-06-09 (×2): 300 mg via ORAL
  Filled 2021-06-08: qty 1
  Filled 2021-06-08: qty 2

## 2021-06-08 MED ORDER — LACTATED RINGERS IV SOLN: INTRAVENOUS | Status: DC

## 2021-06-08 MED ORDER — CEFTRIAXONE SODIUM 1 G IJ SOLR
1.0000 g | Freq: Once | INTRAMUSCULAR | Status: AC
  Administered 2021-06-08: 1 g via INTRAVENOUS
  Filled 2021-06-08: qty 10

## 2021-06-08 MED ORDER — VITAMIN D 25 MCG (1000 UNIT) PO TABS
5000.0000 [IU] | ORAL_TABLET | Freq: Every day | ORAL | Status: DC
  Administered 2021-06-08 – 2021-06-09 (×2): 5000 [IU] via ORAL
  Filled 2021-06-08 (×3): qty 5

## 2021-06-08 MED ORDER — VITAMIN B-12 2500 MCG SL SUBL: 2500.0000 ug | SUBLINGUAL_TABLET | Freq: Every day | SUBLINGUAL | Status: DC

## 2021-06-08 MED ORDER — VITAMIN B-12 1000 MCG PO TABS
2500.0000 ug | ORAL_TABLET | Freq: Every day | ORAL | Status: DC
  Administered 2021-06-08 – 2021-06-09 (×2): 2500 ug via ORAL
  Filled 2021-06-08 (×2): qty 5
  Filled 2021-06-08: qty 3

## 2021-06-08 MED ORDER — SODIUM CHLORIDE 0.9 % IV SOLN
1.0000 g | INTRAVENOUS | Status: DC
  Administered 2021-06-09: 1 g via INTRAVENOUS
  Filled 2021-06-08: qty 10

## 2021-06-08 MED ORDER — FAMOTIDINE 20 MG PO TABS
20.0000 mg | ORAL_TABLET | Freq: Every day | ORAL | Status: DC
  Administered 2021-06-08 – 2021-06-09 (×2): 20 mg via ORAL
  Filled 2021-06-08 (×2): qty 1

## 2021-06-08 MED ORDER — BACLOFEN 20 MG PO TABS
40.0000 mg | ORAL_TABLET | Freq: Four times a day (QID) | ORAL | Status: DC
Start: 2021-06-08 — End: 2021-06-08

## 2021-06-08 MED ORDER — DALFAMPRIDINE ER 10 MG PO TB12
10.0000 mg | ORAL_TABLET | Freq: Two times a day (BID) | ORAL | Status: DC
  Administered 2021-06-08 – 2021-06-09 (×3): 10 mg via ORAL
  Filled 2021-06-08 (×3): qty 1

## 2021-06-08 MED ORDER — ACETAMINOPHEN 650 MG RE SUPP: 650.0000 mg | Freq: Four times a day (QID) | RECTAL | Status: DC | PRN

## 2021-06-08 MED ORDER — DIAZEPAM 5 MG PO TABS
7.5000 mg | ORAL_TABLET | Freq: Three times a day (TID) | ORAL | Status: DC
  Administered 2021-06-08 (×2): 7.5 mg via ORAL
  Filled 2021-06-08 (×2): qty 2

## 2021-06-08 MED ORDER — ALBUTEROL SULFATE (2.5 MG/3ML) 0.083% IN NEBU: 2.5000 mg | INHALATION_SOLUTION | RESPIRATORY_TRACT | Status: DC | PRN

## 2021-06-08 MED ORDER — FENTANYL CITRATE PF 50 MCG/ML IJ SOSY
50.0000 ug | PREFILLED_SYRINGE | Freq: Once | INTRAMUSCULAR | Status: AC
  Administered 2021-06-08: 50 ug via INTRAVENOUS
  Filled 2021-06-08: qty 1

## 2021-06-08 NOTE — ED Notes (Signed)
MD notified of BP not improving despite 2L NS bolus. New orders placed.

## 2021-06-08 NOTE — ED Triage Notes (Addendum)
Patient BIBEMS from home. Hx of MS, presents today with left sided paralysis and right leg paralysis. Patient also presents with slurred speech Symptoms same as previous MS flare ups. Patient normally is able to walk around. Had Baclofen pump removed in November. Patient took 10mg  Oxy at 19pm

## 2021-06-08 NOTE — Plan of Care (Signed)
Called by the ED team-Dr. Long regarding this patient.  History of MS, patient of Dr. Epimenio Foot.  Coming in with worsening of generalized weakness worse on right upper extremity and left lower extremity.  To the patient, similar symptoms as prior flareups. Recommended MRI of the brain, which does not show any new enhancing lesion and is stable from the prior MRI. Of note, labs show a florid UTI. Likely pseudo exacerbation of the symptoms in the setting of a UTI. I would recommend treatment of the UTI and if she does not show improvement or starts feeling better in the next couple of days, kindly call us back for a phone consultation for consideration of steroids. In the past, she has been treated with steroids even in the absence of active lesions on MRI and discussion with her outpatient neurologist, which I am open to but at this point with the clear UTI, immediate use of steroids would be detrimental for her.   -- Milon Dikes, MD Neurologist [facility] Pager: [phone]

## 2021-06-08 NOTE — ED Provider Notes (Signed)
Emergency Department Provider Note   I have reviewed the triage vital signs and the nursing notes.   HISTORY  Chief Complaint Spasms   HPI Sharon Odom is a 51 y.o. female with PMH reviewed below presents to the ED with worsening slurred speech, left arm/leg weakness, along with right leg weakness. Patient with concern for MS flare today noted these symptoms are particularly severe. She had a prior history of baclofen pump but this had to be removed 2/2 infection. She notes that she was doing well yesterday and was able to get up and walk around with her home therapy but at around 8PM today developed sudden worsening symptoms as described.    Past Medical History:  Diagnosis Date   Allergies    Anxiety    Arthritis    Asthma    seasonal - pollen   Constipation    Depression    Dyspnea    with exertion   GERD (gastroesophageal reflux disease)    Headache    History of hiatal hernia    Left radial nerve palsy 03/15/2020   resolved - Left arm/hand weak   Multiple sclerosis (HCC)    Pneumonia 2007   x 1   Vision abnormalities    Wears glasses     Review of Systems  Constitutional: No fever/chills Cardiovascular: Denies chest pain. Respiratory: Denies shortness of breath. Gastrointestinal: No abdominal pain. Skin: Negative for rash. Neurological: Positive HA. Worsening left side weakness and new right arm/leg weakness along with difficulty with speech.    ____________________________________________   PHYSICAL EXAM:  VITAL SIGNS: ED Triage Vitals [06/08/21 0025]  Enc Vitals Group     BP (!) 109/56     Pulse Rate (!) 111     Resp 19     Temp 99.5 F (37.5 C)     Temp Source Oral     SpO2 93 %   Constitutional: Alert and oriented. Chronically ill appearing.  Eyes: Conjunctivae are normal.  Head: Atraumatic. Nose: No congestion/rhinnorhea. Mouth/Throat: Mucous membranes are dry.   Neck: No stridor.  Cardiovascular: mild tachycardia. Good peripheral  circulation. Grossly normal heart sounds.   Respiratory: Normal respiratory effort.  No retractions. Lungs CTAB. Gastrointestinal: Soft and nontender. No distention.  Musculoskeletal: No lower extremity tenderness nor edema. No gross deformities of extremities. Neurologic: Dysarthria noted. Patient 2/5 strength in the LUE and LLE. 4/5 in the RUE and RLE. No facial asymmetry.   Skin:  Skin is warm, dry and intact. No rash noted.   ____________________________________________   LABS (all labs ordered are listed, but only abnormal results are displayed)  Labs Reviewed  BASIC METABOLIC PANEL - Abnormal; Notable for the following components:      Result Value   Glucose, Bld 105 (*)    All other components within normal limits  CBC WITH DIFFERENTIAL/PLATELET - Abnormal; Notable for the following components:   WBC 15.5 (*)    Neutro Abs 13.6 (*)    Monocytes Absolute 1.1 (*)    Abs Immature Granulocytes 0.10 (*)    All other components within normal limits  URINALYSIS, ROUTINE W REFLEX MICROSCOPIC - Abnormal; Notable for the following components:   Leukocytes,Ua LARGE (*)    All other components within normal limits  URINALYSIS, MICROSCOPIC (REFLEX) - Abnormal; Notable for the following components:   Bacteria, UA RARE (*)    All other components within normal limits  RESP PANEL BY RT-PCR (FLU A&B, COVID) ARPGX2  CULTURE, BLOOD (ROUTINE X 2)  CULTURE, BLOOD (ROUTINE X 2)  URINE CULTURE  LACTIC ACID, PLASMA  MAGNESIUM  HIV ANTIBODY (ROUTINE TESTING W REFLEX)  CBC WITH DIFFERENTIAL/PLATELET  BASIC METABOLIC PANEL   ____________________________________________  EKG   EKG Interpretation  Date/Time:  Thursday June 08 2021 00:30:54 EST Ventricular Rate:  113 PR Interval:  186 QRS Duration: 70 QT Interval:  324 QTC Calculation: 444 R Axis:   16 Text Interpretation: Sinus tachycardia Low voltage QRS Nonspecific T wave abnormality Abnormal ECG When compared with ECG of  14-Feb-2021 15:27, PREVIOUS ECG IS PRESENT Tachycardia today but otherwise similar to Sep 2022 Confirmed by Nanda Quinton [phone]) on 06/08/2021 2:03:45 AM        ____________________________________________  RADIOLOGY  MR Brain W and Wo Contrast  Result Date: 06/08/2021 CLINICAL DATA:  Worsening weakness and slurred speech. Multiple sclerosis. EXAM: MRI HEAD WITHOUT AND WITH CONTRAST TECHNIQUE: Multiplanar, multiecho pulse sequences of the brain and surrounding structures were obtained without and with intravenous contrast. CONTRAST:  39mL GADAVIST GADOBUTROL 1 MMOL/ML IV SOLN COMPARISON:  02/02/2021 FINDINGS: Brain: Typical multiple sclerosis pattern with numerous T2 hyperintense plaques in the brainstem, cerebellum, periventricular white matter, and juxtacortical white matter. No new plaque, restricted diffusion, or enhancement. Brain volume is normal. No mass, hemorrhage, hydrocephalus, or collection. Vascular: Normal flow voids and vascular enhancements Skull and upper cervical spine: Normal marrow signal. 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 Sinuses/Orbits: Negative. IMPRESSION: Multiple sclerosis pattern without active or progressive finding since brain MRI 02/02/2021 Electronically Signed   By: Jorje Guild M.D.   On: 06/08/2021 04:26   DG Chest Port 1 View  Result Date: 06/08/2021 CLINICAL DATA:  MS flare EXAM: PORTABLE CHEST 1 VIEW COMPARISON:  None. FINDINGS: Bibasilar opacities, likely atelectasis. Heart is normal size. No effusions. No acute bony abnormality. IMPRESSION: Bibasilar atelectasis. Electronically Signed   By: Rolm Baptise M.D.   On: 06/08/2021 02:23    ____________________________________________   PROCEDURES  Procedure(s) performed:   Procedures  None  ____________________________________________   INITIAL IMPRESSION / ASSESSMENT AND PLAN / ED COURSE  Pertinent labs & imaging results that were available during my care of the patient  were reviewed by me and considered in my medical decision making (see chart for details).   This patient is Presenting for Evaluation of weakness, which does require a range of treatment options, and is a complaint that involves a high risk of morbidity and mortality.  The Differential Diagnoses include MS flare, cord compression, CVA, ICH, metabolic encephalopathy, transverse myelitis.  Critical Interventions- MRI brain and labs along with pain mgmt.    Reassessment after intervention: Patient more comfortable and stable for MRI.    I did Additional Historical Information from mother in law at bedside. Confirms history and symptom onset timeline.   I decided to review pertinent External Data, and in summary patient with baclofen pump removed in November 2022 with discharge summary reviewed. Home health PO/OT orders extended per telephone notes from last week.    Clinical Laboratory Tests Ordered, included CBC and BMP along with UA and COVID swab anticipating admission. Leukocytosis noted to 15. No AKI. No anemia. No electrolyte disturbance.   Radiologic Tests Ordered, included MRI brain w/ and w/o contrast.   Cardiac Monitor Tracing which shows sinus tachycardia.   Reevaluation with update and discussion with patient. Plan for treatment of UTI and hold on steroids for now. Patient in agreement.   Consult complete with neurology and hospitalist.   Medical Decision Making: Summary:  Patient presents to the ED with weakness and decreased function likely 2/2 MS flare. Low stroke suspicion. MSE performed and case discussed with Neurology, Dr. Rory Percy, who recommend MRI brain w/ and w/o contrast. Defer MRI c-spine for now.   MRI with no acute MS lesions. Discussed with Dr. Rory Percy. Plan for treatment of UTI and hold on additional steroids for now.   Discussed patient's case with [facility] to request admission. Patient and family (if present) updated with plan. Care transferred to [facility] service.  I  reviewed all nursing notes, vitals, pertinent old records, EKGs, labs, imaging (as available).   Disposition: Admit  ____________________________________________  FINAL CLINICAL IMPRESSION(S) / ED DIAGNOSES  Final diagnoses:  Weakness  Acute cystitis without hematuria     MEDICATIONS GIVEN DURING THIS VISIT:  Medications  acetaminophen (TYLENOL) tablet 650 mg (650 mg Oral Given 06/08/21 1924)    Or  acetaminophen (TYLENOL) suppository 650 mg ( Rectal See Alternative 06/08/21 1924)  cefTRIAXone (ROCEPHIN) 1 g in sodium chloride 0.9 % 100 mL IVPB (has no administration in time range)  buPROPion (WELLBUTRIN XL) 24 hr tablet 300 mg (300 mg Oral Given 06/08/21 1003)  dalfampridine TB12 10 mg (10 mg Oral Given 06/08/21 2217)  montelukast (SINGULAIR) tablet 10 mg (10 mg Oral Given 06/08/21 1003)  famotidine (PEPCID) tablet 20 mg (20 mg Oral Given 06/08/21 1002)  baclofen (LIORESAL) tablet 40 mg (40 mg Oral Given 06/08/21 2117)  albuterol (PROVENTIL) (2.5 MG/3ML) 0.083% nebulizer solution 2.5 mg (has no administration in time range)  nicotine (NICODERM CQ - dosed in mg/24 hours) patch 14 mg (has no administration in time range)  0.9 %  sodium chloride infusion ( Intravenous New Bag/Given 06/08/21 2355)  cholecalciferol (VITAMIN D3) tablet 5,000 Units (5,000 Units Oral Given 06/08/21 1547)  tiZANidine (ZANAFLEX) tablet 8 mg (8 mg Oral Given 06/08/21 2117)  midodrine (PROAMATINE) tablet 5 mg (5 mg Oral Given 06/08/21 1546)  diazepam (VALIUM) tablet 2 mg (2 mg Oral Given 06/08/21 2117)  vitamin B-12 (CYANOCOBALAMIN) tablet 2,500 mcg (2,500 mcg Oral Given 06/08/21 1546)  fentaNYL (SUBLIMAZE) injection 50 mcg (50 mcg Intravenous Given 06/08/21 0232)  gadobutrol (GADAVIST) 1 MMOL/ML injection 6 mL (6 mLs Intravenous Contrast Given 06/08/21 0407)  sodium chloride 0.9 % bolus 1,000 mL (0 mLs Intravenous Stopped 06/08/21 0623)  cefTRIAXone (ROCEPHIN) 1 g in sodium chloride 0.9 % 100 mL IVPB (0 g Intravenous  Stopped 06/08/21 0553)  sodium chloride 0.9 % bolus 1,000 mL (0 mLs Intravenous Stopped 06/08/21 0820)  sodium chloride 0.9 % bolus 1,000 mL (0 mLs Intravenous Stopped 06/08/21 0820)    Note:  This document was prepared using Dragon voice recognition software and may include unintentional dictation errors.  Nanda Quinton, MD, [facility] Emergency Medicine    Melayah Skorupski, [facility], MD 06/09/21 [phone]

## 2021-06-08 NOTE — H&P (Signed)
History and Physical    PLEASE NOTE THAT DRAGON DICTATION SOFTWARE WAS USED IN THE CONSTRUCTION OF THIS NOTE.   Sharon Odom EQA:[id number] DOB: 1970/12/08 DOA: 06/08/2021  PCP: Clovia Cuff, MD  Patient coming from: home   I have personally briefly reviewed patient's old medical records in [place]  Chief Complaint: Worsening left-sided weakness and worsening slurring of speech  HPI: Sharon Odom is a 51 y.o. female with medical history significant for multiple sclerosis, depression, mild intermittent asthma, seasonal allergies, GERD, urinary incontinence, who is admitted to [facility] on 06/08/2021 with sepsis due to acute cystitis and after presenting from home to [facility] ED complaining of acute on chronic left hemiparesis and acute on chronic dysarthria.   In the setting of a history of multiple sclerosis for which the patient reports chronic left-sided hemiparesis as well as chronic mild dysarthria, she reports development of acute worsening of her left sided weakness in the upper and lower extremity as well as acute worsening of her slurring of speech, starting at approximately 2000 on 06/07/2021.  She notes that this was also associated with new onset of weakness in the right lower extremity, without involvement of the right upper extremity.  Denies any associated facial droop, acute change in vision, dysphagia, or vertigo.  She conveys that the above constellation of symptoms are similar to that which she has experienced at times of previous MS flares, which she notes have improved with course of high-dose steroids, even in the setting of negative MRI brain findings.  She also conveys that she has been exerting herself more than normal over the last several days, did clean the house of preparation for the arrival of her in-laws.  She denies any associated subjective fever, chills, rigors, or generalized myalgias.  Denies any associated dysuria or gross hematuria.  She also  denies any recent chest pain, shortness of breath, nausea, vomiting, palpitations, diaphoresis, dizziness, presyncope, or syncope.  Denies any recent cough, wheezing.  Denies any recent rash, abdominal pain, diarrhea, melena, or hematochezia.  In the setting of her history of multiple sclerosis, she follows with Dr. Felecia Shelling As her outpatient neurologist, with next scheduled outpatient follow-up appointment with Dr. Felecia Shelling is scheduled for September 14, 2021.  She notes that she previously had a baclofen intrathecal pump, although this was removed in November 2022.     ED Course:  Vital signs in the ED were notable for the following: Temperature max 99.5; initial heart rate 111, which improved to 87 following IV fluid administration, as further quantified below; blood pressure 91/57 -109/56; respiratory rate 14-23, oxygen saturation 96 to 100% on room air.  Labs were notable for the following: BMP notable for the following: Sodium 138, creatinine 0.79, glucose 105.  CBC notable for the cell count of 15,500 with 87% neutrophils.  Urinalysis notable for 21-50 white blood cells, large leukocyte Estrace, no squamous epithelial cells.  COVID-19/influenza PCR were found to be negative.  Urine culture as well as blood cultures x2 were collected prior to initiation of IV antibiotics in the ED.  Imaging and additional notable ED work-up: EKG showed sinus tachycardia with heart rate 113, normal intervals, and no evidence of T wave or ST changes, including no evidence of ST elevation.  Chest x-ray showed bibasilar atelectasis, but otherwise no evidence of acute cardiopulmonary process, including no evidence of infiltrate, edema, effusion, or pneumothorax.  MRI of the brain, while awaiting final radiology report, reportedly he is associated preliminary report of no  acute intracranial findings, including no evidence of acute ischemic infarct.  EDP discussed the patient's case with on-call neurology, Dr. Rory Percy, who will  formally consult, and recommends initially treating underlying urinary tract infection as well as suspected dehydration, refraining from initiation of high-dose steroids for now pending observation for improvement of her presenting symptoms with IV antibiotics and rehydration efforts.  While in the ED, the following were administered: Fentanyl 50 mcg IV x1, Rocephin, normal saline x2 L bolus.  Subsequently, the patient was admitted for overnight observation for sepsis due to acute cystitis.      Review of Systems: As per HPI otherwise 10 point review of systems negative.   Past Medical History:  Diagnosis Date   Allergies    Anxiety    Arthritis    Asthma    seasonal - pollen   Constipation    Depression    Dyspnea    with exertion   GERD (gastroesophageal reflux disease)    Headache    History of hiatal hernia    Left radial nerve palsy 03/15/2020   resolved - Left arm/hand weak   Multiple sclerosis ([place])    Pneumonia 2007   x 1   Vision abnormalities    Wears glasses     Past Surgical History:  Procedure Laterality Date   EXCISION MORTON'S NEUROMA Right    fallopian tube removal     INTRATHECAL PUMP IMPLANT Right 02/18/2019   Procedure: INTRATHECAL PUMP IMPLANT;  Surgeon: Clydell Hakim, MD;  Location: [place];  Service: Neurosurgery;  Laterality: Right;  INTRATHECAL PUMP IMPLANT   INTRATHECAL PUMP IMPLANT Right 02/17/2021   Procedure: Baclofen Pump Replacement;  Surgeon: Erline Levine, MD;  Location: [place];  Service: Neurosurgery;  Laterality: Right;  3C/RM 21   PAIN PUMP REMOVAL Left 04/06/2021   Procedure: Removal of baclofen pump, Left;  Surgeon: Dawley, Theodoro Doing, DO;  Location: [place];  Service: Neurosurgery;  Laterality: Left;  Lateral/Left//3C rm 19   UPPER GI ENDOSCOPY     WISDOM TOOTH EXTRACTION     WISDOM TOOTH EXTRACTION      Social History:  reports that she has been smoking cigarettes. She started smoking about 35 years ago. She has a 16.00 pack-year smoking  history. She has never used smokeless tobacco. She reports that she does not currently use alcohol. She reports that she does not use drugs.   Allergies  Allergen Reactions   Ziconotide Acetate Shortness Of Breath    Anorexia, weird sensations, could not talk, choking feeling, lost since of taste and smell. Hallucinations, vivid dreams. Confusion and sleepiness. N/v, increase in pain.  Anorexia, weird sensations, could not talk, choking feeling, lost since of taste and smell. Hallucinations, vivid dreams. Confusion and sleepiness. N/v, increase in pain.    Morphine Other (See Comments)    Pt does not recall reaction    Amoxicillin Nausea And Vomiting   Morphine Sulfate     Other reaction(s): Unknown Other reaction(s): Unknown   Pregabalin Nausea And Vomiting    Family History  Problem Relation Age of Onset   Diabetes Mother    Healthy Brother     Family history reviewed and not pertinent    Prior to Admission medications   Medication Sig Start Date End Date Taking? Authorizing Provider  acetaminophen (TYLENOL) 500 MG tablet Take 1,000 mg by mouth every 6 (six) hours as needed (pain).    [provider]  albuterol (VENTOLIN HFA) 108 (90 Base) MCG/ACT inhaler Inhale 1-2 puffs  into the lungs every 6 (six) hours as needed for wheezing or shortness of breath. 04/15/19   Angiulli, Lavon Paganini, PA-C  Alemtuzumab (LEMTRADA) 12 MG/1.2ML SOLN Inject 12 mg into the vein See admin instructions. Once a year    [provider]  baclofen (LIORESAL) 20 MG tablet Take 2 tablets (40 mg total) by mouth 4 (four) times daily. Since ITB pump out 04/17/21   Lovorn, Jinny Blossom, MD  buPROPion (WELLBUTRIN XL) 300 MG 24 hr tablet TAKE 1 TABLET DAILY 04/20/20   Lovorn, Jinny Blossom, MD  carboxymethylcellulose (REFRESH PLUS) 0.5 % SOLN Place 1 drop into both eyes 3 (three) times daily as needed (dry eyes).    [provider]  Cholecalciferol (VITAMIN D-3) 125 MCG (5000 UT) TABS Take 5,000 Units by  mouth daily. 04/15/19   Angiulli, Lavon Paganini, PA-C  Cyanocobalamin (VITAMIN B-12) 2500 MCG SUBL Place 1 tablet (2,500 mcg total) under the tongue daily. 04/15/19   Angiulli, Lavon Paganini, PA-C  cyproheptadine (PERIACTIN) 4 MG tablet Take 1 tablet (4 mg total) by mouth 3 (three) times daily. 04/15/21   Kristeen Miss, MD  dalfampridine 10 MG TB12 Take 1 tablet (10 mg total) by mouth 2 (two) times daily. One po bid 03/16/21   Sater, Nanine Means, MD  diazepam (VALIUM) 5 MG tablet Take 1.5 tablets (7.5 mg total) by mouth every 8 (eight) hours. 04/17/21   Lovorn, Jinny Blossom, MD  diphenhydramine-acetaminophen (TYLENOL PM) 25-500 MG TABS tablet Take 2 tablets by mouth at bedtime.    [provider]  montelukast (SINGULAIR) 10 MG tablet TAKE 1 TABLET DAILY 04/20/20   Lovorn, Jinny Blossom, MD  Oxycodone HCl 10 MG TABS Take 1 tablet (10 mg total) by mouth every 4 (four) hours as needed. 03/17/21   Bayard Hugger, NP  oxymetazoline (AFRIN) 0.05 % nasal spray Place 1 spray into both nostrils 2 (two) times daily as needed for congestion.    [provider]  polyethylene glycol (MIRALAX / GLYCOLAX) 17 g packet Take 17 g by mouth daily as needed for moderate constipation.    [provider]  raNITIdine HCl (ZANTAC PO) Take 10 mg by mouth daily.    [provider]  senna-docusate (SENOKOT-S) 8.6-50 MG tablet Take 1 tablet by mouth daily. Patient taking differently: Take 3 tablets by mouth daily. 04/27/19   Raulkar, Clide Deutscher, MD  tiZANidine (ZANAFLEX) 4 MG tablet Take 2 tablets (8 mg total) by mouth 3 (three) times daily. 04/17/21   Lovorn, Jinny Blossom, MD  trospium (SANCTURA) 20 MG tablet Take 20 mg by mouth 2 (two) times daily.    [provider]  Wheat Dextrin (BENEFIBER) POWD Take 3.5 Scoops by mouth 2 (two) times daily.    [provider]     Objective    Physical Exam: Vitals:   06/08/21 0415 06/08/21 0431 06/08/21 0447 06/08/21 0448  BP: 96/65 (!) 90/57 (!) 92/56 (!)  91/57  Pulse: 88 88 87 87  Resp: _0 Temp:      TempSrc:      SpO2: 96% 96% 96% 96%    General: appears to be stated age; alert, oriented Skin: warm, dry, no rash Head:  AT/[place] Mouth:  Oral mucosa membranes appear moist, normal dentition Neck: supple; trachea midline Heart:  RRR; did not appreciate any M/R/G Lungs: CTAB, did not appreciate any wheezes, rales, or rhonchi Abdomen: + BS; soft, ND, NT Vascular: 2+ pedal pulses b/l; 2+ radial pulses b/l Extremities: no peripheral edema, no  muscle wasting Neuro: 3/5 strength of the proximal and distal flexors and extensors of the upper and lower extremities on left and 4/5 in RLE; 5/5 RUE; sensation intact in upper and lower extremities b/l; cranial nerves II through XII grossly intact; dysarthria noted;  No tremors.   Labs on Admission: I have personally reviewed following labs and imaging studies  CBC: Recent Labs  Lab 06/08/21 0116  WBC 15.5*  NEUTROABS 13.6*  HGB 12.5  HCT 37.2  MCV 94.2  PLT 109   Basic Metabolic Panel: Recent Labs  Lab 06/08/21 0116  NA 138  K 3.6  CL 106  CO2 22  GLUCOSE 105*  BUN 12  CREATININE 0.79  CALCIUM 9.0   GFR: CrCl cannot be calculated (Unknown ideal weight.). Liver Function Tests: No results for input(s): AST, ALT, ALKPHOS, BILITOT, PROT, ALBUMIN in the last 168 hours. No results for input(s): LIPASE, AMYLASE in the last 168 hours. No results for input(s): AMMONIA in the last 168 hours. Coagulation Profile: No results for input(s): INR, PROTIME in the last 168 hours. Cardiac Enzymes: No results for input(s): CKTOTAL, CKMB, CKMBINDEX, TROPONINI in the last 168 hours. BNP (last 3 results) No results for input(s): PROBNP in the last 8760 hours. HbA1C: No results for input(s): HGBA1C in the last 72 hours. CBG: No results for input(s): GLUCAP in the last 168 hours. Lipid Profile: No results for input(s): CHOL, HDL, LDLCALC, TRIG, CHOLHDL, LDLDIRECT in the last 72  hours. Thyroid Function Tests: No results for input(s): TSH, T4TOTAL, FREET4, T3FREE, THYROIDAB in the last 72 hours. Anemia Panel: No results for input(s): VITAMINB12, FOLATE, FERRITIN, TIBC, IRON, RETICCTPCT in the last 72 hours. Urine analysis:    Component Value Date/Time   COLORURINE YELLOW 06/08/2021 0411   APPEARANCEUR CLEAR 06/08/2021 0411   APPEARANCEUR Clear 11/11/2019 1559   LABSPEC 1.020 06/08/2021 0411   PHURINE 7.0 06/08/2021 0411   GLUCOSEU NEGATIVE 06/08/2021 0411   HGBUR NEGATIVE 06/08/2021 0411   BILIRUBINUR NEGATIVE 06/08/2021 0411   BILIRUBINUR Negative 11/11/2019 1559   KETONESUR NEGATIVE 06/08/2021 0411   PROTEINUR NEGATIVE 06/08/2021 0411   NITRITE NEGATIVE 06/08/2021 0411   LEUKOCYTESUR LARGE (A) 06/08/2021 0411    Radiological Exams on Admission: DG Chest Port 1 View  Result Date: 06/08/2021 CLINICAL DATA:  MS flare EXAM: PORTABLE CHEST 1 VIEW COMPARISON:  None. FINDINGS: Bibasilar opacities, likely atelectasis. Heart is normal size. No effusions. No acute bony abnormality. IMPRESSION: Bibasilar atelectasis. Electronically Signed   By: Rolm Baptise M.D.   On: 06/08/2021 02:23     EKG: Independently reviewed, with result as described above.    Assessment/Plan   Principal Problem:   Acute cystitis Active Problems:   Depression with anxiety   Multiple sclerosis (HCC)   Sepsis ([place])   Dehydration   Mild intermittent asthma   Urinary incontinence     #) Sepsis due to acute cystitis: SIRS criteria met via presenting leukocytosis as well as tachycardia, with presenting urinalysis consistent with UTI in the setting of significant pyuria.  No evidence of additional underlying infectious process at this time, including negative COVID-19/influenza PCR as well as presenting chest x-ray showing no evidence of acute cardiopulmonary process.  Blood cultures x2 and urine culture collected prior to initiation of IV Rocephin.  Lactic acid level ordered, with  result currently pending.  Plan: 1 additional liter NS bolus followed by initiation of continuous NS at 100 cc/h.  Follow-up result of lactic acid level.  Follow-up results of blood cultures  x2 as well as urine culture.  Continue Rocephin.  Repeat CBC with differential in the morning.     #) Dehydration, clinical and laboratory evidence to suggest such, with the appearance of dry oral mucous membranes as well as elevated specific gravity on presenting urinalysis.   Plan: IV fluids, as above.  Monitor strict I's and O's and O's.  Repeat BMP in the morning.      #) Acute on chronic left hemiparesis as well as acute on chronic dysarthria: Without evidence of acute ischemic CVA, may represent MS flare.  Neurology consulted, with recommendation for treatment of underlying urinary tract infection as well as dehydration, with close monitoring of ensuing trend of these neurologic symptoms and response to these measures, prior to initiation of high-dose corticosteroids, as further detailed above per consultation of Dr. Rory Percy of Neurology.  Plan: Neurology consulted, as above.  Every 4 hours neurochecks been ordered.  Further evaluation management of sepsis due to UTI as well as dehydration, as further detailed above.  Continue home dalfampridine.        #) Mild intermittent asthma: On prn albuterol inhaler as an outpatient.  No clinical evidence to suggest acute exacerbation at this time.  Plan: Continue home prn albuterol inhaler.  Add on serum magnesium level.  Incentive spirometry ordered, particular given presenting chest x-ray showing evidence of bibasilar atelectasis, without additional cardiopulmonary process identified.      #) GERD: On ranitidine as an outpatient.  Plan: We will hold home not sitting in favor formulary H2 blocker in the form of Pepcid.       #) Urinary incontinence: Likely has a component of known multiple sclerosis, on tropsium as an outpatient.  However,  the setting of presenting urinary tract infection, with increased risk for development of such due to anticholinergic implications of this medication, will hold home tropsium for now.   Plan: Hold home tropsium , as above.  Monitor strict I's and O's and daily weights.  Repeat BMP in the morning.      #) Depression: On Wellbutrin as an outpatient.  Plan: Continue Wellbutrin.      DVT prophylaxis: SCD's   Code Status: Full code Family Communication: none Disposition Plan: Per Rounding Team Consults called: Dr. Rory Percy of neuro consulted, as further detailed above;  Admission status: obs; pcu   PLEASE NOTE THAT DRAGON DICTATION SOFTWARE WAS USED IN THE CONSTRUCTION OF THIS NOTE.   [place] DO [facility]  From [place]   06/08/2021, 5:50 AM

## 2021-06-08 NOTE — ED Notes (Signed)
Patient can barely stay away.  Reports she is suppose to take valium baclofen and tizanidine 3 times a day.  MD has been asked about tizanidine with no orders but will message again.

## 2021-06-08 NOTE — Progress Notes (Signed)
PROGRESS NOTE    Sharon Odom  ZOX:[id number] DOB: Jun 22, 1970 DOA: 06/08/2021 PCP: Annita Brod, MD   Chief Complain: Worsening left-sided weakness, slurred speech  Brief Narrative: Patient is a 51 year old female with history of multiple sclerosis, depression, mild intermittent asthma, GERD, urinary incontinence who presented here with complaint of worsening left-sided weakness, acute on chronic dysarthria.  No report of fever, chills or dysuria.  On presentation she was hypertensive.  CBC showed elevated white cell count, UA suggestive of UTI.  Urine culture, blood culture sent.  MRI of the brain did not show any acute intracranial findings.  There is suspicion for MS flare but neurology suggested to treat UTI before starting on high-dose steroids/consulting neurology  Assessment & Plan:   Principal Problem:   Acute cystitis Active Problems:   Depression with anxiety   Multiple sclerosis (HCC)   Sepsis (HCC)   Dehydration   Mild intermittent asthma   Urinary incontinence   Suspected sepsis due to acute cystitis: Presented with leukocytosis, low blood pressure.  UA consistent with UTI.  Urine culture sent, blood culture sent.  Started on ceftriaxone.  She denies any  dysuria and says she does not feel bladder symptoms.  She has history of recurrent UTI.  Dehydration/hypotension: Blood pressure consistently low in the emergency department.  Maintain MAP above 65.  Given 3 L of boluses.  Continue maintenance IV fluids.  Will start on midodrine.  History of multiple sclerosis/left-sided weakness: Presented with worsening of left-sided weakness, dysarthria.  Her symptoms are also suspicious for MS flare.  Case discussed with neurology.  Waiting for treatment of urinary tract infection before initiating high-dose corticosteroids if needed.  MRI did not show any acute findings. Patient is on high-dose of baclofen, Valium, tizanidine at home.  She looks very lethargic and drowsy.  We will   be careful on continuing these medications.  We will discontinue Valium for now. She follows with Dr Epimenio Foot  History of urinary incontinence: Takes trospium as an outpatient  Depression: On wellbutrin  Deconditioning/debility: Patient will require PT/OT evaluation when appropriate             DVT prophylaxis:SCD Code Status: Full Family Communication:  Patient status:Inpatient  Dispo: The patient is from: home              Anticipated d/c is to: home              Anticipated d/c date is: Not sure  Consultants: Neurology  Procedures: None  Antimicrobials:  Anti-infectives (From admission, onward)    Start     Dose/Rate Route Frequency Ordered Stop   06/09/21 0800  cefTRIAXone (ROCEPHIN) 1 g in sodium chloride 0.9 % 100 mL IVPB        1 g 200 mL/hr over 30 Minutes Intravenous Every 24 hours 06/08/21 0549     06/08/21 0530  cefTRIAXone (ROCEPHIN) 1 g in sodium chloride 0.9 % 100 mL IVPB        1 g 200 mL/hr over 30 Minutes Intravenous  Once 06/08/21 0517 06/08/21 0553       Subjective:  Patient seen and examined at the bedside today.  Her blood pressure was soft.  She was drowsy, sleepy but alert and oriented.  She was requesting to add her home medication tizanidine   objective: Vitals:   06/08/21 1100 06/08/21 1130 06/08/21 1245 06/08/21 1345  BP: 105/67 111/76 98/66 95/75   Pulse: 94 92 86 87  Resp: 18 (!) Temp:  TempSrc:      SpO2: 100% 99% 100% 100%    Intake/Output Summary (Last 24 hours) at 06/08/2021 1417 Last data filed at 06/08/2021 0820 Gross per 24 hour  Intake 3000 ml  Output --  Net 3000 ml   There were no vitals filed for this visit.  Examination:  General exam: Very drowsy, sleepy, chronically looking, deconditioned HEENT: PERRL Respiratory system:  no wheezes or crackles  Cardiovascular system: S1 & S2 heard, RRR.  Gastrointestinal system: Abdomen is nondistended, soft and nontender. Central nervous system: Alert and  oriented, weakness on the left side Extremities: No edema, no clubbing ,no cyanosis Skin: No rashes, no ulcers,no icterus      Data Reviewed: I have personally reviewed following labs and imaging studies  CBC: Recent Labs  Lab 06/08/21 0116  WBC 15.5*  NEUTROABS 13.6*  HGB 12.5  HCT 37.2  MCV 94.2  PLT 196   Basic Metabolic Panel: Recent Labs  Lab 06/08/21 0116 06/08/21 0933  NA 138  --   K 3.6  --   CL 106  --   CO2 22  --   GLUCOSE 105*  --   BUN 12  --   CREATININE 0.79  --   CALCIUM 9.0  --   MG  --  1.7   GFR: CrCl cannot be calculated (Unknown ideal weight.). Liver Function Tests: No results for input(s): AST, ALT, ALKPHOS, BILITOT, PROT, ALBUMIN in the last 168 hours. No results for input(s): LIPASE, AMYLASE in the last 168 hours. No results for input(s): AMMONIA in the last 168 hours. Coagulation Profile: No results for input(s): INR, PROTIME in the last 168 hours. Cardiac Enzymes: No results for input(s): CKTOTAL, CKMB, CKMBINDEX, TROPONINI in the last 168 hours. BNP (last 3 results) No results for input(s): PROBNP in the last 8760 hours. HbA1C: No results for input(s): HGBA1C in the last 72 hours. CBG: No results for input(s): GLUCAP in the last 168 hours. Lipid Profile: No results for input(s): CHOL, HDL, LDLCALC, TRIG, CHOLHDL, LDLDIRECT in the last 72 hours. Thyroid Function Tests: No results for input(s): TSH, T4TOTAL, FREET4, T3FREE, THYROIDAB in the last 72 hours. Anemia Panel: No results for input(s): VITAMINB12, FOLATE, FERRITIN, TIBC, IRON, RETICCTPCT in the last 72 hours. Sepsis Labs: Recent Labs  Lab 06/08/21 0540  LATICACIDVEN 0.9    Recent Results (from the past 240 hour(s))  Resp Panel by RT-PCR (Flu A&B, Covid) Nasopharyngeal Swab     Status: None   Collection Time: 06/08/21  2:18 AM   Specimen: Nasopharyngeal Swab; Nasopharyngeal(NP) swabs in vial transport medium  Result Value Ref Range Status   SARS Coronavirus 2 by RT  PCR NEGATIVE NEGATIVE Final    Comment: (NOTE) SARS-CoV-2 target nucleic acids are NOT DETECTED.  The SARS-CoV-2 RNA is generally detectable in upper respiratory specimens during the acute phase of infection. The lowest concentration of SARS-CoV-2 viral copies this assay can detect is 138 copies/mL. A negative result does not preclude SARS-Cov-2 infection and should not be used as the sole basis for treatment or other patient management decisions. A negative result may occur with  improper specimen collection/handling, submission of specimen other than nasopharyngeal swab, presence of viral mutation(s) within the areas targeted by this assay, and inadequate number of viral copies(<138 copies/mL). A negative result must be combined with clinical observations, patient history, and epidemiological information. The expected result is Negative.  Fact Sheet for Patients:  BloggerCourse.com  Fact Sheet for Healthcare Providers:  SeriousBroker.it  This  test is no t yet approved or cleared by the [place] and  has been authorized for detection and/or diagnosis of SARS-CoV-2 by FDA under an Emergency Use Authorization (EUA). This EUA will remain  in effect (meaning this test can be used) for the duration of the COVID-19 declaration under Section 564(b)(1) of the Act, 21 U.S.C.section 360bbb-3(b)(1), unless the authorization is terminated  or revoked sooner.       Influenza A by PCR NEGATIVE NEGATIVE Final   Influenza B by PCR NEGATIVE NEGATIVE Final    Comment: (NOTE) The Xpert Xpress SARS-CoV-2/FLU/RSV plus assay is intended as an aid in the diagnosis of influenza from Nasopharyngeal swab specimens and should not be used as a sole basis for treatment. Nasal washings and aspirates are unacceptable for Xpert Xpress SARS-CoV-2/FLU/RSV testing.  Fact Sheet for Patients: BloggerCourse.com  Fact Sheet for  Healthcare Providers: SeriousBroker.it  This test is not yet approved or cleared by the [place] FDA and has been authorized for detection and/or diagnosis of SARS-CoV-2 by FDA under an Emergency Use Authorization (EUA). This EUA will remain in effect (meaning this test can be used) for the duration of the COVID-19 declaration under Section 564(b)(1) of the Act, 21 U.S.C. section 360bbb-3(b)(1), unless the authorization is terminated or revoked.  Performed at [facility], [address] [place], [place] [address]   Culture, blood (routine x 2)     Status: None (Preliminary result)   Collection Time: 06/08/21  5:27 AM   Specimen: BLOOD RIGHT HAND  Result Value Ref Range Status   Specimen Description BLOOD RIGHT HAND  Final   Special Requests   Final    BOTTLES DRAWN AEROBIC AND ANAEROBIC Blood Culture adequate volume   Culture   Final    NO GROWTH < 12 HOURS Performed at [facility], [address], [place], [place] [address]    Report Status PENDING  Incomplete  Culture, blood (routine x 2)     Status: None (Preliminary result)   Collection Time: 06/08/21  5:32 AM   Specimen: BLOOD RIGHT FOREARM  Result Value Ref Range Status   Specimen Description BLOOD RIGHT FOREARM  Final   Special Requests   Final    BOTTLES DRAWN AEROBIC AND ANAEROBIC Blood Culture adequate volume   Culture   Final    NO GROWTH < 12 HOURS Performed at [facility], [address] [place], [place] [address]    Report Status PENDING  Incomplete         Radiology Studies: DG Chest Port 1 View  Result Date: 06/08/2021 CLINICAL DATA:  MS flare EXAM: PORTABLE CHEST 1 VIEW COMPARISON:  None. FINDINGS: Bibasilar opacities, likely atelectasis. Heart is normal size. No effusions. No acute bony abnormality. IMPRESSION: Bibasilar atelectasis. Electronically Signed   By: Charlett Nose M.D.   On: 06/08/2021 02:23        Scheduled Meds:  baclofen  40 mg  Oral TID AC & HS   buPROPion  300 mg Oral Daily   cyproheptadine  4 mg Oral TID   dalfampridine  10 mg Oral BID   diazepam  7.5 mg Oral Q8H   famotidine  20 mg Oral Daily   montelukast  10 mg Oral Daily   Continuous Infusions:  sodium chloride 125 mL/hr at 06/08/21 0902   [START ON 06/09/2021] cefTRIAXone (ROCEPHIN)  IV       LOS: 0 days    Time spent: More than 50%  of that time was spent in counseling and/or coordination of care.      Burnadette Pop, MD [facility] P1/04/2022, 2:17 PM

## 2021-06-08 NOTE — ED Notes (Signed)
Patient transported to MRI 

## 2021-06-08 NOTE — ED Notes (Signed)
MD ok'd to dc 2nd lactic due to first being negative

## 2021-06-08 NOTE — ED Provider Triage Note (Signed)
Emergency Medicine Provider Triage Evaluation Note  Sharon Odom , a 51 y.o. female  was evaluated in triage.  Pt complains of worsening left-sided weakness as well as difficulty moving her right lower extremity and worsening slurred speech.  Patient with history of MS and has been having increasing issues with her MS since she had to have her baclofen pump removed in November.  She reports this is the worst flare she has had.  Symptoms started after she thinks she overdid it trying to clean for her in-laws to come in town.  Followed by Dr. Felecia Shelling with neurology  Review of Systems  Positive: Weakness, numbness, slurred speech Negative: Chest pain, shortness of breath, abdominal pain  Physical Exam  BP (!) 109/56 (BP Location: Right Arm)    Pulse (!) 111    Temp 99.5 F (37.5 C) (Oral)    Resp 19    SpO2 93%  Gen:   Awake, no distress   Resp:  Normal effort  Neuro:  Unable to move the left upper and lower extremity, 3/5 strength in the right lower extremity, slurred speech, cranial nerves otherwise intact.  Medical Decision Making  Medically screening exam initiated at 12:36 AM.  Appropriate orders placed.  Sharon Odom was informed that the remainder of the evaluation will be completed by another provider, this initial triage assessment does not replace that evaluation, and the importance of remaining in the ED until their evaluation is complete.  Spoke with Dr. Malen Odom with neurology who reviewed patient's prior imaging, recommends getting MRI of the brain with and without contrast as well as basic labs, urinalysis and chest x-ray.   Sharon Odom, [place] 06/08/21 0105

## 2021-06-09 DIAGNOSIS — N3000 Acute cystitis without hematuria: Secondary | ICD-10-CM | POA: Diagnosis not present

## 2021-06-09 LAB — CBC WITH DIFFERENTIAL/PLATELET
Abs Immature Granulocytes: 0.04 10*3/uL (ref 0.00–0.07)
Basophils Absolute: 0 10*3/uL (ref 0.0–0.1)
Basophils Relative: 0 %
Eosinophils Absolute: 0.1 10*3/uL (ref 0.0–0.5)
Eosinophils Relative: 1 %
HCT: 32.7 % — ABNORMAL LOW (ref 36.0–46.0)
Hemoglobin: 10.6 g/dL — ABNORMAL LOW (ref 12.0–15.0)
Immature Granulocytes: 1 %
Lymphocytes Relative: 17 %
Lymphs Abs: 1.5 10*3/uL (ref 0.7–4.0)
MCH: 31.3 pg (ref 26.0–34.0)
MCHC: 32.4 g/dL (ref 30.0–36.0)
MCV: 96.5 fL (ref 80.0–100.0)
Monocytes Absolute: 0.6 10*3/uL (ref 0.1–1.0)
Monocytes Relative: 7 %
Neutro Abs: 6.5 10*3/uL (ref 1.7–7.7)
Neutrophils Relative %: 74 %
Platelets: 170 10*3/uL (ref 150–400)
RBC: 3.39 MIL/uL — ABNORMAL LOW (ref 3.87–5.11)
RDW: 13.2 % (ref 11.5–15.5)
WBC: 8.8 10*3/uL (ref 4.0–10.5)
nRBC: 0 % (ref 0.0–0.2)

## 2021-06-09 LAB — GLUCOSE, CAPILLARY
Glucose-Capillary: 59 mg/dL — ABNORMAL LOW (ref 70–99)
Glucose-Capillary: 106 mg/dL — ABNORMAL HIGH (ref 70–99)

## 2021-06-09 LAB — BASIC METABOLIC PANEL
Anion gap: 4 — ABNORMAL LOW (ref 5–15)
BUN: 7 mg/dL (ref 6–20)
CO2: 22 mmol/L (ref 22–32)
Calcium: 8.5 mg/dL — ABNORMAL LOW (ref 8.9–10.3)
Chloride: 115 mmol/L — ABNORMAL HIGH (ref 98–111)
Creatinine, Ser: 0.73 mg/dL (ref 0.44–1.00)
GFR, Estimated: >60 mL/min (ref >60)
Glucose, Bld: 87 mg/dL (ref 70–99)
Potassium: 3.9 mmol/L (ref 3.5–5.1)
Sodium: 141 mmol/L (ref 135–145)

## 2021-06-09 LAB — URINE CULTURE

## 2021-06-09 LAB — HIV ANTIBODY (ROUTINE TESTING W REFLEX): HIV Screen 4th Generation wRfx: NONREACTIVE

## 2021-06-09 MED ORDER — MIDODRINE HCL 5 MG PO TABS
5.0000 mg | ORAL_TABLET | Freq: Three times a day (TID) | ORAL | 0 refills | Status: DC
Start: 2021-06-09 — End: 2021-06-18

## 2021-06-09 MED ORDER — CIPROFLOXACIN HCL 500 MG PO TABS: 500.0000 mg | ORAL_TABLET | Freq: Two times a day (BID) | ORAL | 0 refills | Status: AC

## 2021-06-09 MED ORDER — OXYCODONE HCL 5 MG PO TABS
10.0000 mg | ORAL_TABLET | Freq: Four times a day (QID) | ORAL | Status: DC | PRN
  Administered 2021-06-09: 10 mg via ORAL
  Filled 2021-06-09: qty 2

## 2021-06-09 NOTE — TOC Transition Note (Signed)
Transition of Care [facility]) - CM/SW Discharge Note   Patient Details  Name: Sharon Odom MRN: [id number] Date of Birth: 11-Dec-1970  [facility] CM/SW Contact:  Pollie Friar, RN Phone Number: 06/09/2021, 11:27 AM   Clinical Narrative:    Patient is from home with her spouse that works during the day M-F. She is planning on getting caregiver services arranged in the near future to assist with some of the home needs.  She has all needed DME at home.  She currently active with [facility] and CM has updated them on her admission and discharge back home today with resumption orders.  Pt denies issues with home medications or transportation.  Pt has transport home today.   Final next level of care: Home w Home Health Services Barriers to Discharge: No Barriers Identified   Patient Goals and CMS Choice   CMS Medicare.gov Compare Post Acute Care list provided to:: Patient Choice offered to / list presented to : Patient  Discharge Placement                       Discharge Plan and Services                          HH Arranged: PT, OT [facility] Agency: [place] ([facility]) Date [place]: 06/09/21   Representative spoke with at [place]: [place] ([place]) Interventions     Readmission Risk Interventions No flowsheet data found.

## 2021-06-09 NOTE — Discharge Summary (Signed)
Physician Discharge Summary  Sharon Odom TDV:[id number] DOB: 1970/12/06 DOA: 06/08/2021  PCP: Annita Brod, MD  Admit date: 06/08/2021 Discharge date: 06/09/2021  Admitted From: Home Disposition:  Home  Discharge Condition:Stable CODE STATUS:FULL Diet recommendation: Regular   Brief/Interim Summary:  Patient is a 51 year old female with history of multiple sclerosis, depression, mild intermittent asthma, GERD, urinary incontinence who presented here with complaint of worsening left-sided weakness, acute on chronic dysarthria.  No report of fever, chills or dysuria.  On presentation she was hyportensive.  CBC showed elevated white cell count, UA suggestive of UTI.  Urine culture, blood culture sent.  MRI of the brain did not show any acute intracranial findings.  Urine culture showed multiple species.  She is symptomatically significantly better today.  Blood pressure stable.  PT/OT recommend home health.  She is medically stable for discharge to home.  Following problems were addressed during her hospitalization:  Suspected sepsis due to acute cystitis: Presented with leukocytosis, low blood pressure.  UA consistent with UTI.  Urine culture sent, blood culture sent.  Started on ceftriaxone.  She denies any  dysuria and says she does not feel bladder symptoms.  She has history of recurrent UTI. Urine culture showed multiple species, blood cultures no growth till date.  She got 2 doses of ceftriaxone here.  Upon reviewing her past urine culture, her urine had grown E. coli resistant to Keflex but sensitive to ceftriaxone, ciprofloxacin.  She will be discharged today to home with 1 more day of ciprofloxacin to be started tomorrow so that she will complete 3 days course of antibiotics   Dehydration/hypotension: Blood pressure was consistently low in the emergency department.  Given IV fluids, started on midodrine.  Blood pressure stable today.  Continue midodrine on discharge   History of  multiple sclerosis/left-sided weakness: Presented with worsening of left-sided weakness, dysarthria.  Case discussed with neurology, no indication of steroids for now because MRI did not show any acute findings. Patient is on high-dose of baclofen, Valium, tizanidine at home. She follows with Dr Epimenio Foot, neurology   History of urinary incontinence: Takes trospium as an outpatient   Depression: On wellbutrin   Deconditioning/debility: PT/OT recommended home is on discharge        Principal Problem:   Acute cystitis Active Problems:   Depression with anxiety   Multiple sclerosis (HCC)   Sepsis (HCC)   Dehydration   Mild intermittent asthma   Urinary incontinence    Discharge Instructions  Discharge Instructions     Diet general   Complete by: As directed    Discharge instructions   Complete by: As directed    1)Please take prescribed medication as instructed 2)Follow up with your neurologist and PCP as an outpatient   Increase activity slowly   Complete by: As directed       Allergies as of 06/09/2021       Reactions   Ziconotide Acetate Shortness Of Breath   Anorexia, weird sensations, could not talk, choking feeling, lost since of taste and smell. Hallucinations, vivid dreams. Confusion and sleepiness. N/v, increase in pain.  Anorexia, weird sensations, could not talk, choking feeling, lost since of taste and smell. Hallucinations, vivid dreams. Confusion and sleepiness. N/v, increase in pain.    Morphine Other (See Comments)   Pt does not recall reaction    Amoxicillin Nausea And Vomiting   Morphine Sulfate Other (See Comments)   Unknown   Pregabalin Nausea And Vomiting        Medication List  STOP taking these medications    cyproheptadine 4 MG tablet Commonly known as: PERIACTIN   Lemtrada 12 MG/1.2ML Soln Generic drug: Alemtuzumab       TAKE these medications    acetaminophen 500 MG tablet Commonly known as: TYLENOL Take 1,000 mg by mouth  every 6 (six) hours as needed (pain).   albuterol 108 (90 Base) MCG/ACT inhaler Commonly known as: VENTOLIN HFA Inhale 1-2 puffs into the lungs every 6 (six) hours as needed for wheezing or shortness of breath.   baclofen 20 MG tablet Commonly known as: LIORESAL Take 2 tablets (40 mg total) by mouth 4 (four) times daily. Since ITB pump out What changed: when to take this   Benefiber Powd Take 3.5 Scoops by mouth 2 (two) times daily.   buPROPion 300 MG 24 hr tablet Commonly known as: WELLBUTRIN XL TAKE 1 TABLET DAILY   carboxymethylcellulose 0.5 % Soln Commonly known as: REFRESH PLUS Place 1 drop into both eyes 3 (three) times daily as needed (dry eyes).   ciprofloxacin 500 MG tablet Commonly known as: Cipro Take 1 tablet (500 mg total) by mouth 2 (two) times daily for 1 day. Start taking on: June 10, 2021   dalfampridine 10 MG Tb12 Take 1 tablet (10 mg total) by mouth 2 (two) times daily. One po bid What changed: additional instructions   diazepam 5 MG tablet Commonly known as: VALIUM Take 1.5 tablets (7.5 mg total) by mouth every 8 (eight) hours.   midodrine 5 MG tablet Commonly known as: PROAMATINE Take 1 tablet (5 mg total) by mouth 3 (three) times daily with meals.   montelukast 10 MG tablet Commonly known as: SINGULAIR TAKE 1 TABLET DAILY   Oxycodone HCl 10 MG Tabs Take 1 tablet (10 mg total) by mouth every 4 (four) hours as needed.   oxymetazoline 0.05 % nasal spray Commonly known as: AFRIN Place 1 spray into both nostrils 2 (two) times daily as needed for congestion.   polyethylene glycol 17 g packet Commonly known as: MIRALAX / GLYCOLAX Take 17 g by mouth daily as needed for moderate constipation.   senna-docusate 8.6-50 MG tablet Commonly known as: Senokot-S Take 1 tablet by mouth daily. What changed:  how much to take when to take this reasons to take this   tiZANidine 4 MG tablet Commonly known as: ZANAFLEX Take 2 tablets (8 mg total) by  mouth 3 (three) times daily.   trospium 20 MG tablet Commonly known as: SANCTURA Take 20 mg by mouth 2 (two) times daily.   Vitamin B-12 2500 MCG Subl Place 1 tablet (2,500 mcg total) under the tongue daily.   Vitamin D-3 125 MCG (5000 UT) Tabs Take 5,000 Units by mouth daily.   ZANTAC PO Take 10 mg by mouth daily.        Follow-up Information     Annita Brod, MD. Schedule an appointment as soon as possible for a visit in 1 week(s).   Specialty: Internal Medicine Contact information: [address] [place] [address] [phone]                Allergies  Allergen Reactions   Ziconotide Acetate Shortness Of Breath    Anorexia, weird sensations, could not talk, choking feeling, lost since of taste and smell. Hallucinations, vivid dreams. Confusion and sleepiness. N/v, increase in pain.  Anorexia, weird sensations, could not talk, choking feeling, lost since of taste and smell. Hallucinations, vivid dreams. Confusion and sleepiness. N/v, increase in pain.  Morphine Other (See Comments)    Pt does not recall reaction    Amoxicillin Nausea And Vomiting   Morphine Sulfate Other (See Comments)    Unknown   Pregabalin Nausea And Vomiting    Consultations: None   Procedures/Studies: MR Brain W and Wo Contrast  Result Date: 06/08/2021 CLINICAL DATA:  Worsening weakness and slurred speech. Multiple sclerosis. EXAM: MRI HEAD WITHOUT AND WITH CONTRAST TECHNIQUE: Multiplanar, multiecho pulse sequences of the brain and surrounding structures were obtained without and with intravenous contrast. CONTRAST:  16mL GADAVIST GADOBUTROL 1 MMOL/ML IV SOLN COMPARISON:  02/02/2021 FINDINGS: Brain: Typical multiple sclerosis pattern with numerous T2 hyperintense plaques in the brainstem, cerebellum, periventricular white matter, and juxtacortical white matter. No new plaque, restricted diffusion, or enhancement. Brain volume is normal. No mass, hemorrhage, hydrocephalus, or  collection. Vascular: Normal flow voids and vascular enhancements Skull and upper cervical spine: Normal marrow signal. 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 Sinuses/Orbits: Negative. IMPRESSION: Multiple sclerosis pattern without active or progressive finding since brain MRI 02/02/2021 Electronically Signed   By: Tiburcio Pea M.D.   On: 06/08/2021 04:26   DG Chest Port 1 View  Result Date: 06/08/2021 CLINICAL DATA:  MS flare EXAM: PORTABLE CHEST 1 VIEW COMPARISON:  None. FINDINGS: Bibasilar opacities, likely atelectasis. Heart is normal size. No effusions. No acute bony abnormality. IMPRESSION: Bibasilar atelectasis. Electronically Signed   By: Charlett Nose M.D.   On: 06/08/2021 02:23      Subjective: Patient seen and examined at the bedside this morning.  Hemodynamically stable for discharge today.  Discharge Exam: Vitals:   06/09/21 0714 06/09/21 1042  BP: 129/74 129/77  Pulse: 67 74  Resp: 20   Temp: 98.5 F (36.9 C)   SpO2: 98% 100%   Vitals:   06/09/21 0302 06/09/21 0500 06/09/21 0714 06/09/21 1042  BP: 104/77  129/74 129/77  Pulse: 66  67 74  Resp: 18  20   Temp: (!) 97.5 F (36.4 C)  98.5 F (36.9 C)   TempSrc: Oral  Oral   SpO2: 98%  98% 100%  Weight:  68.6 kg    Height:        General: Pt is alert, awake, not in acute distress Cardiovascular: RRR, S1/S2 +, no rubs, no gallops Respiratory: CTA bilaterally, no wheezing, no rhonchi Abdominal: Soft, NT, ND, bowel sounds + Extremities: no edema, no cyanosis    The results of significant diagnostics from this hospitalization (including imaging, microbiology, ancillary and laboratory) are listed below for reference.     Microbiology: Recent Results (from the past 240 hour(s))  Resp Panel by RT-PCR (Flu A&B, Covid) Nasopharyngeal Swab     Status: None   Collection Time: 06/08/21  2:18 AM   Specimen: Nasopharyngeal Swab; Nasopharyngeal(NP) swabs in vial transport medium  Result Value Ref  Range Status   SARS Coronavirus 2 by RT PCR NEGATIVE NEGATIVE Final    Comment: (NOTE) SARS-CoV-2 target nucleic acids are NOT DETECTED.  The SARS-CoV-2 RNA is generally detectable in upper respiratory specimens during the acute phase of infection. The lowest concentration of SARS-CoV-2 viral copies this assay can detect is 138 copies/mL. A negative result does not preclude SARS-Cov-2 infection and should not be used as the sole basis for treatment or other patient management decisions. A negative result may occur with  improper specimen collection/handling, submission of specimen other than nasopharyngeal swab, presence of viral mutation(s) within the areas targeted by this assay, and inadequate number of viral copies(<138 copies/mL).  A negative result must be combined with clinical observations, patient history, and epidemiological information. The expected result is Negative.  Fact Sheet for Patients:  BloggerCourse.com  Fact Sheet for Healthcare Providers:  SeriousBroker.it  This test is no t yet approved or cleared by the [place] FDA and  has been authorized for detection and/or diagnosis of SARS-CoV-2 by FDA under an Emergency Use Authorization (EUA). This EUA will remain  in effect (meaning this test can be used) for the duration of the COVID-19 declaration under Section 564(b)(1) of the Act, 21 U.S.C.section 360bbb-3(b)(1), unless the authorization is terminated  or revoked sooner.       Influenza A by PCR NEGATIVE NEGATIVE Final   Influenza B by PCR NEGATIVE NEGATIVE Final    Comment: (NOTE) The Xpert Xpress SARS-CoV-2/FLU/RSV plus assay is intended as an aid in the diagnosis of influenza from Nasopharyngeal swab specimens and should not be used as a sole basis for treatment. Nasal washings and aspirates are unacceptable for Xpert Xpress SARS-CoV-2/FLU/RSV testing.  Fact Sheet for  Patients: BloggerCourse.com  Fact Sheet for Healthcare Providers: SeriousBroker.it  This test is not yet approved or cleared by the [place] FDA and has been authorized for detection and/or diagnosis of SARS-CoV-2 by FDA under an Emergency Use Authorization (EUA). This EUA will remain in effect (meaning this test can be used) for the duration of the COVID-19 declaration under Section 564(b)(1) of the Act, 21 U.S.C. section 360bbb-3(b)(1), unless the authorization is terminated or revoked.  Performed at [facility], [address], [place], [place] [address]   Urine Culture     Status: Abnormal   Collection Time: 06/08/21  5:18 AM   Specimen: Urine, Clean Catch  Result Value Ref Range Status   Specimen Description URINE, CLEAN CATCH  Final   Special Requests   Final    NONE Performed at [facility], [address], [place], [place] [address]    Culture MULTIPLE SPECIES PRESENT, SUGGEST RECOLLECTION (A)  Final   Report Status 06/09/2021 FINAL  Final  Culture, blood (routine x 2)     Status: None (Preliminary result)   Collection Time: 06/08/21  5:27 AM   Specimen: BLOOD RIGHT HAND  Result Value Ref Range Status   Specimen Description BLOOD RIGHT HAND  Final   Special Requests   Final    BOTTLES DRAWN AEROBIC AND ANAEROBIC Blood Culture adequate volume   Culture   Final    NO GROWTH 1 DAY Performed at [facility], [address], [place], [place] [address]    Report Status PENDING  Incomplete  Culture, blood (routine x 2)     Status: None (Preliminary result)   Collection Time: 06/08/21  5:32 AM   Specimen: BLOOD RIGHT FOREARM  Result Value Ref Range Status   Specimen Description BLOOD RIGHT FOREARM  Final   Special Requests   Final    BOTTLES DRAWN AEROBIC AND ANAEROBIC Blood Culture adequate volume   Culture   Final    NO GROWTH 1 DAY Performed at [facility], [address],  [place], [place] [address]    Report Status PENDING  Incomplete     Labs: BNP (last 3 results) No results for input(s): BNP in the last 8760 hours. Basic Metabolic Panel: Recent Labs  Lab 06/08/21 0116 06/08/21 0933 06/09/21 0530  NA 138  --  141  K 3.6  --  3.9  CL 106  --  115*  CO2 22  --  22  GLUCOSE 105*  --  87  BUN 12  --  7  CREATININE 0.79  --  0.73  CALCIUM 9.0  --  8.5*  MG  --  1.7  --    Liver Function Tests: No results for input(s): AST, ALT, ALKPHOS, BILITOT, PROT, ALBUMIN in the last 168 hours. No results for input(s): LIPASE, AMYLASE in the last 168 hours. No results for input(s): AMMONIA in the last 168 hours. CBC: Recent Labs  Lab 06/08/21 0116 06/09/21 0530  WBC 15.5* 8.8  NEUTROABS 13.6* 6.5  HGB 12.5 10.6*  HCT 37.2 32.7*  MCV 94.2 96.5  PLT 196 170   Cardiac Enzymes: No results for input(s): CKTOTAL, CKMB, CKMBINDEX, TROPONINI in the last 168 hours. BNP: Invalid input(s): POCBNP CBG: Recent Labs  Lab 06/09/21 0751 06/09/21 0944  GLUCAP 59* 106*   D-Dimer No results for input(s): DDIMER in the last 72 hours. Hgb A1c No results for input(s): HGBA1C in the last 72 hours. Lipid Profile No results for input(s): CHOL, HDL, LDLCALC, TRIG, CHOLHDL, LDLDIRECT in the last 72 hours. Thyroid function studies No results for input(s): TSH, T4TOTAL, T3FREE, THYROIDAB in the last 72 hours.  Invalid input(s): FREET3 Anemia work up No results for input(s): VITAMINB12, FOLATE, FERRITIN, TIBC, IRON, RETICCTPCT in the last 72 hours. Urinalysis    Component Value Date/Time   COLORURINE YELLOW 06/08/2021 0411   APPEARANCEUR CLEAR 06/08/2021 0411   APPEARANCEUR Clear 11/11/2019 1559   LABSPEC 1.020 06/08/2021 0411   PHURINE 7.0 06/08/2021 0411   GLUCOSEU NEGATIVE 06/08/2021 0411   HGBUR NEGATIVE 06/08/2021 0411   BILIRUBINUR NEGATIVE 06/08/2021 0411   BILIRUBINUR Negative 11/11/2019 1559   KETONESUR NEGATIVE 06/08/2021 0411   PROTEINUR NEGATIVE  06/08/2021 0411   NITRITE NEGATIVE 06/08/2021 0411   LEUKOCYTESUR LARGE (A) 06/08/2021 0411   Sepsis Labs Invalid input(s): PROCALCITONIN,  WBC,  LACTICIDVEN Microbiology Recent Results (from the past 240 hour(s))  Resp Panel by RT-PCR (Flu A&B, Covid) Nasopharyngeal Swab     Status: None   Collection Time: 06/08/21  2:18 AM   Specimen: Nasopharyngeal Swab; Nasopharyngeal(NP) swabs in vial transport medium  Result Value Ref Range Status   SARS Coronavirus 2 by RT PCR NEGATIVE NEGATIVE Final    Comment: (NOTE) SARS-CoV-2 target nucleic acids are NOT DETECTED.  The SARS-CoV-2 RNA is generally detectable in upper respiratory specimens during the acute phase of infection. The lowest concentration of SARS-CoV-2 viral copies this assay can detect is 138 copies/mL. A negative result does not preclude SARS-Cov-2 infection and should not be used as the sole basis for treatment or other patient management decisions. A negative result may occur with  improper specimen collection/handling, submission of specimen other than nasopharyngeal swab, presence of viral mutation(s) within the areas targeted by this assay, and inadequate number of viral copies(<138 copies/mL). A negative result must be combined with clinical observations, patient history, and epidemiological information. The expected result is Negative.  Fact Sheet for Patients:  BloggerCourse.com  Fact Sheet for Healthcare Providers:  SeriousBroker.it  This test is no t yet approved or cleared by the [place] FDA and  has been authorized for detection and/or diagnosis of SARS-CoV-2 by FDA under an Emergency Use Authorization (EUA). This EUA will remain  in effect (meaning this test can be used) for the duration of the COVID-19 declaration under Section 564(b)(1) of the Act, 21 U.S.C.section 360bbb-3(b)(1), unless the authorization is terminated  or revoked sooner.  Influenza A by PCR NEGATIVE NEGATIVE Final   Influenza B by PCR NEGATIVE NEGATIVE Final    Comment: (NOTE) The Xpert Xpress SARS-CoV-2/FLU/RSV plus assay is intended as an aid in the diagnosis of influenza from Nasopharyngeal swab specimens and should not be used as a sole basis for treatment. Nasal washings and aspirates are unacceptable for Xpert Xpress SARS-CoV-2/FLU/RSV testing.  Fact Sheet for Patients: BloggerCourse.com  Fact Sheet for Healthcare Providers: SeriousBroker.it  This test is not yet approved or cleared by the [place] FDA and has been authorized for detection and/or diagnosis of SARS-CoV-2 by FDA under an Emergency Use Authorization (EUA). This EUA will remain in effect (meaning this test can be used) for the duration of the COVID-19 declaration under Section 564(b)(1) of the Act, 21 U.S.C. section 360bbb-3(b)(1), unless the authorization is terminated or revoked.  Performed at [facility], [address], [place], [place] [address]   Urine Culture     Status: Abnormal   Collection Time: 06/08/21  5:18 AM   Specimen: Urine, Clean Catch  Result Value Ref Range Status   Specimen Description URINE, CLEAN CATCH  Final   Special Requests   Final    NONE Performed at [facility], [address], [place], [place] [address]    Culture MULTIPLE SPECIES PRESENT, SUGGEST RECOLLECTION (A)  Final   Report Status 06/09/2021 FINAL  Final  Culture, blood (routine x 2)     Status: None (Preliminary result)   Collection Time: 06/08/21  5:27 AM   Specimen: BLOOD RIGHT HAND  Result Value Ref Range Status   Specimen Description BLOOD RIGHT HAND  Final   Special Requests   Final    BOTTLES DRAWN AEROBIC AND ANAEROBIC Blood Culture adequate volume   Culture   Final    NO GROWTH 1 DAY Performed at [facility], [address] [place], [place] [address]    Report Status PENDING  Incomplete  Culture,  blood (routine x 2)     Status: None (Preliminary result)   Collection Time: 06/08/21  5:32 AM   Specimen: BLOOD RIGHT FOREARM  Result Value Ref Range Status   Specimen Description BLOOD RIGHT FOREARM  Final   Special Requests   Final    BOTTLES DRAWN AEROBIC AND ANAEROBIC Blood Culture adequate volume   Culture   Final    NO GROWTH 1 DAY Performed at [facility], [address], [place], [place] [address]    Report Status PENDING  Incomplete    Please note: You were cared for by a hospitalist during your hospital stay. Once you are discharged, your primary care physician will handle any further medical issues. Please note that NO REFILLS for any discharge medications will be authorized once you are discharged, as it is imperative that you return to your primary care physician (or establish a relationship with a primary care physician if you do not have one) for your post hospital discharge needs so that they can reassess your need for medications and monitor your lab values.    Time coordinating discharge: 40 minutes  SIGNED:   Burnadette Pop, MD  [facility] 06/09/2021, 11:14 AM Pager [phone]  If 7PM-7AM, please contact night-coverage [email or web address] Password [email or web address]

## 2021-06-09 NOTE — Evaluation (Addendum)
Occupational Therapy Evaluation Patient Details Name: Sharon Odom MRN: [id number] DOB: 05-23-1971 Today's Date: 06/09/2021   History of Present Illness Pt is a 51 y/o female presenting on 06/08/21 with complaints of worsening L sided weakness, acute on chronic dysarthrita. UA suggestive of UTI, MRI negative, questionable MS flare. Found with sepsis due to acutecystitis. PMH includes: anxiety, depression, MS, PNA, bacolfen pump removal 04/06/21. .   Clinical Impression   PTA patient reports independent with ADLs, using wc for majority of mobility with modified independence. Patient admitted for above and limited by L sided weakness, impaired balance and decreased activity tolerance.  Patient currently demonstrates ability to complete transfers using RW with min guard, ADLs with up to min guard assist.  She reports her spouse works during the day, but she has good support from in laws (staying with her currently) and neighbors. Educated on safety, fall prevention and recommendations. She will benefit from further OT services while admitted and after dc to resume [place], recommend initial 24/7 support for safety. Will follow acutely.    Recommendations for follow up therapy are one component of a multi-disciplinary discharge planning process, led by the attending physician.  Recommendations may be updated based on patient status, additional functional criteria and insurance authorization.   Follow Up Recommendations  Home health OT    Assistance Recommended at Discharge Frequent or constant Supervision/Assistance (inital 24/7 recommended)  Patient can return home with the following A little help with walking and/or transfers;A little help with bathing/dressing/bathroom;Assistance with cooking/housework    Functional Status Assessment  Patient has had a recent decline in their functional status and demonstrates the ability to make significant improvements in function in a reasonable and predictable  amount of time.  Equipment Recommendations  None recommended by OT    Recommendations for Other Services       Precautions / Restrictions Precautions Precautions: Fall Precaution Comments: L sided weakness Restrictions Weight Bearing Restrictions: No      Mobility Bed Mobility Overal bed mobility: Needs Assistance Bed Mobility: Supine to Sit;Sit to Supine     Supine to sit: Supervision Sit to supine: Supervision   General bed mobility comments: increased time, HOB elevated and towards L side    Transfers                          Balance Overall balance assessment: Needs assistance Sitting-balance support: No upper extremity supported;Feet supported Sitting balance-Leahy Scale: Good     Standing balance support: Bilateral upper extremity supported;During functional activity;Reliant on assistive device for balance Standing balance-Leahy Scale: Fair Standing balance comment: relies on BUE support on RW                           ADL either performed or assessed with clinical judgement   ADL Overall ADL's : Needs assistance/impaired Eating/Feeding: Set up   Grooming: Set up;Sitting   Upper Body Bathing: Sitting;Set up   Lower Body Bathing: Min guard;Sit to/from stand   Upper Body Dressing : Set up;Sitting   Lower Body Dressing: Min guard;Sit to/from stand   Toilet Transfer: Min guard;Stand-pivot;Rolling walker (2 wheels) Armed forces technical officer Details (indicate cue type and reason): simulated to recliner Toileting- Clothing Manipulation and Hygiene: Min guard;Sitting/lateral lean       Functional mobility during ADLs: Min guard;Cueing for safety;Rolling walker (2 wheels) General ADL Comments: pt limited by L sided weakness and impaired balance     Vision  Baseline Vision/History: 1 Wears glasses Ability to See in Adequate Light: 1 Impaired Patient Visual Report: No change from baseline Vision Assessment?: No apparent visual deficits      Perception     Praxis      Pertinent Vitals/Pain Pain Assessment: Faces Faces Pain Scale: Hurts little more Pain Location: L LE with spasms Pain Descriptors / Indicators: Discomfort;Grimacing;Spasm Pain Intervention(s): Limited activity within patient's tolerance;Monitored during session;Repositioned     Hand Dominance Right   Extremity/Trunk Assessment Upper Extremity Assessment Upper Extremity Assessment: Generalized weakness;LUE deficits/detail LUE Deficits / Details: L sided weakness, 3-/5 with decreased [place] but pt reports baseline level LUE Coordination: decreased gross motor;decreased fine motor   Lower Extremity Assessment Lower Extremity Assessment: Defer to PT evaluation       Communication Communication Communication: No difficulties   Cognition Arousal/Alertness: Awake/alert Behavior During Therapy: Flat affect Overall Cognitive Status: No family/caregiver present to determine baseline cognitive functioning                                 General Comments: slow processing but anticpate this is baseline     General Comments  VSS on RA    Exercises     Shoulder Instructions      Home Living Family/patient expects to be discharged to:: Private residence Living Arrangements: Spouse/significant other Available Help at Discharge: Family;Available PRN/intermittently Type of Home: House Home Access: Other (comment) (VPL)     Home Layout: Two level;Able to live on main level with bedroom/bathroom     Bathroom Shower/Tub: Other (comment) (roll in shower)   Bathroom Toilet: Handicapped height Bathroom Accessibility: Yes   Home Equipment: Grab bars - tub/shower;Grab bars - toilet;Rolling Walker (2 wheels);Rollator (4 wheels);Wheelchair - manual;Shower seat - built in;Adaptive equipment;Cane - single point   Additional Comments: spouse works during the day      Prior Functioning/Environment Prior Level of Function : Needs assist              Mobility Comments: uses wc mainly at home, limited mobility using rollator reports transfering independently ADLs Comments: independent ADLs, assist with IADLs        OT Problem List: Decreased strength;Decreased activity tolerance;Impaired balance (sitting and/or standing);Decreased coordination;Decreased safety awareness;Impaired UE functional use;Pain      OT Treatment/Interventions: Self-care/ADL training;Neuromuscular education;DME and/or AE instruction;Therapeutic activities;Patient/family education;Balance training    OT Goals(Current goals can be found in the care plan section) Acute Rehab OT Goals Patient Stated Goal: home OT Goal Formulation: With patient Time For Goal Achievement: 06/23/21 Potential to Achieve Goals: Good  OT Frequency: Min 2X/week    Co-evaluation              AM-PAC OT "6 Clicks" Daily Activity     Outcome Measure Help from another person eating meals?: A Little Help from another person taking care of personal grooming?: A Little Help from another person toileting, which includes using toliet, bedpan, or urinal?: A Little Help from another person bathing (including washing, rinsing, drying)?: A Little Help from another person to put on and taking off regular upper body clothing?: A Little Help from another person to put on and taking off regular lower body clothing?: A Little 6 Click Score: 18   End of Session Equipment Utilized During Treatment: Rolling walker (2 wheels);Gait belt Nurse Communication: Mobility status  Activity Tolerance: Patient tolerated treatment well Patient left: with call bell/phone within reach;in bed;with bed alarm set  OT Visit Diagnosis: Other abnormalities of gait and mobility (R26.89);Muscle weakness (generalized) (M62.81);Other symptoms and signs involving the nervous system (R29.898)                Time: [id number] OT Time Calculation (min): 44 min Charges:  OT General Charges $OT Visit: 1 Visit OT  Evaluation $OT Eval Moderate Complexity: 1 Mod OT Treatments $Self Care/Home Management : 23-37 mins  Jolaine Artist, OT Acute Rehabilitation Services Pager [phone] Office [phone]   Delight Stare 06/09/2021, 9:11 AM

## 2021-06-09 NOTE — Progress Notes (Signed)
PT evaluation Note   06/09/21 0935 06/09/21 1000  PT Visit Information  Last PT Received On 06/09/21  --   Assistance Needed +1  --   History of Present Illness Pt is a 51 y/o female presenting on 06/08/21 with complaints of worsening L sided weakness, acute on chronic dysarthrita. UA suggestive of UTI, MRI negative, questionable MS flare. Found with sepsis due to acutecystitis. PMH includes: anxiety, depression, MS, PNA, bacolfen pump removal 04/06/21. [place] Kitchen  --   Precautions  Precautions Fall  --   Precaution Comments L sided weakness  --   Restrictions  Weight Bearing Restrictions No  --   Home Living  Family/patient expects to be discharged to: Private residence  --   Living Arrangements Spouse/significant other  --   Available Help at Discharge Family;Available PRN/intermittently  --   Type of Home House  --   Home Access  (vertical platform)  --   Home Layout Two level;Able to live on main level with bedroom/bathroom  --   Bathroom Shower/Tub Other (comment) (roll in shower)  --   Bathroom Toilet Handicapped height  --   Home Equipment Grab bars - tub/shower;Grab bars - toilet;Rolling Walker (2 wheels);Rollator (4 wheels);Wheelchair - manual;Shower seat - built in;Cane - single point  --   Prior Function  Prior Level of Function  Needs assist  --   Mobility Comments uses wc mainly at home, limited mobility using rollator reports transfering independently  --   ADLs Comments independent ADLs, assist with IADLs  --   Communication  Communication Other (comment) (slurred speech)  --   Pain Assessment  Pain Assessment Faces  --   Faces Pain Scale 6  --   Pain Location L LE with spasms  --   Pain Descriptors / Indicators Discomfort;Grimacing;Spasm  --   Pain Intervention(s) Limited activity within patient's tolerance;Monitored during session;Repositioned  --   Cognition  Arousal/Alertness Awake/alert  --   Behavior During Therapy Flat affect  --   Overall Cognitive Status No  family/caregiver present to determine baseline cognitive functioning  --   General Comments slow processing but anticpate this is baseline  --   Upper Extremity Assessment  Upper Extremity Assessment Defer to OT evaluation  --   Lower Extremity Assessment  Lower Extremity Assessment LLE deficits/detail  --   LLE Deficits / Details Grossly 2/5 in hip flexors and 3-/5 for knee extension.  --   Cervical / Trunk Assessment  Cervical / Trunk Assessment Normal  --   Bed Mobility  Overal bed mobility Needs Assistance  --   Bed Mobility Supine to Sit;Sit to Supine  --   Supine to sit Min guard  --   Sit to supine Min assist  --   General bed mobility comments Min guard for safety to come to sitting. Min A for LE assist for return to supine.  --   Transfers  Overall transfer level Needs assistance  --   Equipment used Rolling walker (2 wheels)  --   Transfers Sit to/from Stand;Bed to chair/wheelchair/BSC  --   Sit to Stand Min guard  --   Bed to/from chair/wheelchair/BSC transfer type: Stand pivot  --   Stand pivot transfers Min guard  --   General transfer comment Min guard for safety. Able to transfer to/from chair, but demonstrated difficulty lifting LLE.  --   Balance  Overall balance assessment Needs assistance  --   Sitting-balance support No upper extremity supported  --  Sitting balance-Leahy Scale Good  --   Standing balance support Bilateral upper extremity supported  --   Standing balance-Leahy Scale Poor  --   Standing balance comment Reliant on BUE support  --   PT - End of Session  Equipment Utilized During Treatment Gait belt  --   Activity Tolerance Patient tolerated treatment well  --   Patient left in bed;with call bell/phone within reach;with bed alarm set  --   Nurse Communication Mobility status  --   PT Assessment  PT Recommendation/Assessment Patient needs continued PT services  --   PT Visit Diagnosis Unsteadiness on feet (R26.81);Muscle weakness (generalized)  (M62.81);Difficulty in walking, not elsewhere classified (R26.2)  --   PT Problem List Decreased strength;Decreased activity tolerance;Decreased balance;Decreased mobility;Decreased coordination;Decreased knowledge of use of DME;Decreased knowledge of precautions  --   PT Plan  PT Frequency (ACUTE ONLY) Min 3X/week  --   PT Treatment/Interventions (ACUTE ONLY) DME instruction;Gait training;Functional mobility training;Therapeutic activities;Therapeutic exercise;Balance training;Patient/family education;Wheelchair mobility training  --   AM-PAC PT "6 Clicks" Mobility Outcome Measure (Version 2)  Help needed turning from your back to your side while in a flat bed without using bedrails? 3  --   Help needed moving from lying on your back to sitting on the side of a flat bed without using bedrails? 3  --   Help needed moving to and from a bed to a chair (including a wheelchair)? 3  --   Help needed standing up from a chair using your arms (e.g., wheelchair or bedside chair)? 3  --   Help needed to walk in hospital room? 2  --   Help needed climbing 3-5 steps with a railing?  2  --   6 Click Score 16  --   Consider Recommendation of Discharge To: Home with [facility]  --   Progressive Mobility  What is the highest level of mobility based on the progressive mobility assessment? Level 3 (Stands with assist) - Balance while standing  and cannot march in place  --   Mobility Out of bed to chair with meals;Out of bed for toileting  --   PT Recommendation  Follow Up Recommendations Home health PT  --   Assistance recommended at discharge Frequent or constant Supervision/Assistance  --   Patient can return home with the following A little help with walking and/or transfers  --   Functional Status Assessment Patient has had a recent decline in their functional status and demonstrates the ability to make significant improvements in function in a reasonable and predictable amount of time.  --   PT equipment None  recommended by PT  --   Individuals Consulted  Consulted and Agree with Results and Recommendations Patient  --   Acute Rehab PT Goals  Patient Stated Goal to get L hip looked at  --   PT Goal Formulation With patient  --   Time For Goal Achievement 06/23/21  --   Potential to Achieve Goals Good  --   PT Time Calculation  PT Start Time (ACUTE ONLY) 0933  --   PT Stop Time (ACUTE ONLY) 1000  --   PT Time Calculation (min) (ACUTE ONLY) 27 min  --   PT General Charges  $$ ACUTE PT VISIT 1 Visit  --   PT Evaluation  $PT Eval Moderate Complexity 1 Mod  --   PT Treatments  $Therapeutic Activity 8-22 mins  --   Written Expression  Dominant Hand Right  --  Pt admitted secondary to problem above with deficits above. Pt presenting with L sided weakness and demonstrated difficulty with LLE advancement during mobility tasks. Required min guard A for transfers using RW. Pt also reporting L hip issues for the last year; MD notified. Pt reports she is currently seeing HHPT, so recommend resumption at d/c. Will continue to follow acutely.   Farley Ly, PT, DPT  Acute Rehabilitation Services  Pager: [phone] Office: [phone]

## 2021-06-12 ENCOUNTER — Ambulatory Visit: Payer: BLUE CROSS/BLUE SHIELD | Admitting: Physical Medicine and Rehabilitation

## 2021-06-13 LAB — CULTURE, BLOOD (ROUTINE X 2)
Culture: NO GROWTH
Culture: NO GROWTH
Special Requests: ADEQUATE
Special Requests: ADEQUATE

## 2021-06-14 ENCOUNTER — Encounter: Payer: Self-pay | Admitting: Neurology

## 2021-06-16 ENCOUNTER — Other Ambulatory Visit: Payer: Self-pay

## 2021-06-16 ENCOUNTER — Encounter
Payer: BLUE CROSS/BLUE SHIELD | Attending: Physical Medicine and Rehabilitation | Admitting: Physical Medicine and Rehabilitation

## 2021-06-16 ENCOUNTER — Encounter: Payer: Self-pay | Admitting: Physical Medicine and Rehabilitation

## 2021-06-16 VITALS — BP 115/76 | HR 86 | Ht 69.0 in | Wt 134.0 lb

## 2021-06-16 DIAGNOSIS — Z993 Dependence on wheelchair: Secondary | ICD-10-CM | POA: Insufficient documentation

## 2021-06-16 DIAGNOSIS — G8220 Paraplegia, unspecified: Secondary | ICD-10-CM | POA: Insufficient documentation

## 2021-06-16 DIAGNOSIS — N319 Neuromuscular dysfunction of bladder, unspecified: Secondary | ICD-10-CM | POA: Insufficient documentation

## 2021-06-16 DIAGNOSIS — M62838 Other muscle spasm: Secondary | ICD-10-CM | POA: Insufficient documentation

## 2021-06-16 DIAGNOSIS — G35 Multiple sclerosis: Secondary | ICD-10-CM | POA: Diagnosis present

## 2021-06-16 MED ORDER — DIAZEPAM 5 MG PO TABS: 7.5000 mg | ORAL_TABLET | Freq: Three times a day (TID) | ORAL | 5 refills | Status: DC

## 2021-06-16 NOTE — Progress Notes (Signed)
Subjective:    Patient ID: Sharon Odom, female    DOB: [birth year], 51 y.o.   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 Hoarseness AND dysarthria; not just 1 issue. Insomnia and ITB refills  ITB pump removed due to pump infection 11/10 by Dr Reatha Armour- here for f/u on spasticity.    Has some atelectasis on CXR- thought meant partially collapsed lung.  Was admitted for probable UTI/sepsis-  And worsening of spasticity/MS flare-got 3L of IVFs and had dehydration/hypotension.   And they decreased her Valium to 2 mg.  But increased back to home dose when went home.  Back to Valium 7.5 mg ; Back to Baclofen 40 mg TID , and Zanaflex- 8 mg TID.  Off Periactin-   Was given Cipro and finished after got home.  Having more spasms still.    Has not  been able to walk again and spasms are worse.  Has called Dr Felecia Shelling- and they are closed Friday. So will call Monday.       Pain Inventory Average Pain 9 Pain Right Now 4 My pain is constant, sharp, burning, dull, stabbing, tingling, and aching  LOCATION OF PAIN  neck, wrist, hand, fingers, groin, hip, thigh, knee, leg, toes  BOWEL Number of stools per week: 5-8 Oral laxative use Yes  Type of laxative Fiber & Senokot   BLADDER Normal In and out cath, frequency n/a Able to self cath No  Bladder incontinence Yes  Frequent urination Yes  Leakage with coughing No  Difficulty starting stream No  Incomplete bladder emptying Yes    Mobility walk with assistance ability to climb steps?  no do you drive?  no  Function disabled: date disabled 2011 I need assistance with the following:  meal prep, household duties, and shopping Do you have any goals in this area?  yes  Neuro/Psych bladder control problems bowel control problems weakness numbness tingling trouble walking confusion depression  Prior Studies Any changes since last visit?   no, recent hospital visit.  Physicians involved in your care Any changes since last visit?  no   Family History  Problem Relation Age of Onset   Diabetes Mother    Healthy Brother    Social History   Socioeconomic History   Marital status: Married    Spouse name: Not on file   Number of children: Not on file   Years of education: Not on file   Highest education level: Not on file  Occupational History   Not on file  Tobacco Use   Smoking status: Every Day    Packs/day: 0.50    Years: 32.00    Pack years: 16.00    Types: Cigarettes    Start date: 1988   Smokeless tobacco: Never  Vaping Use   Vaping Use: Former   Quit date: 05/29/2011  Substance and Sexual Activity   Alcohol use: Not Currently    Comment: 2-3 times per wk   Drug use: Never   Sexual activity: Not Currently    Birth control/protection: Post-menopausal  Other Topics Concern   Not on file  Social History Narrative   Right handed    Caffeine: 1 cup or less per day- coffee   [employer]   Social Determinants of Health   Financial Resource Strain: Not on file  Food Insecurity: Not on file  Transportation Needs: Not on file  Physical Activity:  Not on file  Stress: Not on file  Social Connections: Not on file   Past Surgical History:  Procedure Laterality Date   EXCISION MORTON'S NEUROMA Right    fallopian tube removal     INTRATHECAL PUMP IMPLANT Right 02/18/2019   Procedure: INTRATHECAL PUMP IMPLANT;  Surgeon: Clydell Hakim, MD;  Location: [facility];  Service: Neurosurgery;  Laterality: Right;  INTRATHECAL PUMP IMPLANT   INTRATHECAL PUMP IMPLANT Right 02/17/2021   Procedure: Baclofen Pump Replacement;  Surgeon: Erline Levine, MD;  Location: [facility];  Service: Neurosurgery;  Laterality: Right;  3C/RM 21   PAIN PUMP REMOVAL Left 04/06/2021   Procedure: Removal of baclofen pump, Left;  Surgeon: Dawley, Theodoro Doing, DO;  Location: [facility];  Service: Neurosurgery;  Laterality: Left;  Lateral/Left//3C rm 19   UPPER GI  ENDOSCOPY     WISDOM TOOTH EXTRACTION     WISDOM TOOTH EXTRACTION     Past Medical History:  Diagnosis Date   Allergies    Anxiety    Arthritis    Asthma    seasonal - pollen   Constipation    Depression    Dyspnea    with exertion   GERD (gastroesophageal reflux disease)    Headache    History of hiatal hernia    Left radial nerve palsy 03/15/2020   resolved - Left arm/hand weak   Multiple sclerosis ([place])    Pneumonia 2007   x 1   Vision abnormalities    Wears glasses    BP 115/76    Pulse 86    Ht 5\' 9"  (1.753 m)    SpO2 97%    BMI 22.33 kg/m   Opioid Risk Score:   Fall Risk Score:  `1  Depression screen PHQ 2/9  Depression screen [facility] 2/9 06/16/2021 04/17/2021 12/21/2020 08/08/2020 02/08/2020 12/14/2019 09/07/2019  Decreased Interest 0 0 0 1 1 0 1  Down, Depressed, Hopeless 0 0 0 1 1 0 1  PHQ - 2 Score 0 0 0 2 2 0 2  Altered sleeping - - - - - - -  Tired, decreased energy - - - - - - -  Change in appetite - - - - - - -  Feeling bad or failure about yourself  - - - - - - -  Trouble concentrating - - - - - - -  Moving slowly or fidgety/restless - - - - - - -  Suicidal thoughts - - - - - - -  PHQ-9 Score - - - - - - -  Difficult doing work/chores - - - - - - -    Review of Systems  Constitutional:  Positive for chills and fever.  Respiratory:  Positive for cough and shortness of breath.   Cardiovascular:  Positive for leg swelling.  Gastrointestinal:  Positive for constipation, diarrhea and nausea.  All other systems reviewed and are negative.     Objective:   Physical Exam Awake, alert, dysarthria- is stable, in manual w/c; accompanied by husband, NAD  RUE: biceps 4+/5; Triceps 4+/5 with tremor during testing; WE 4/5; Grip 4+/5 and FA 3-/5 LUE- biceps 4/5; triceps- 4/5; WE 4-/5 grip 4-/5; FA 2-/5 RLE- HF 4/5; KE 4+/5; DF 4/5; PF 4/5 LLE- HF 1/5; KE 2/5; DF 0/5; PF 2-/5  Neuro: Went into extensor tone with ROM_ of LLE L hand cramping/spasming with ROM-  tendency to claw up Hoffman's LUE (+) Few beats clonus LLE; not RLE Can barely do ROM of L knee  and hip and ankle due to tone- some due to extensor tone, but worse even after it relaxed.       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 Hoarseness AND dysarthria; not just 1 issue. Insomnia and ITB refills  ITB pump removed due to pump infection 11/10 by Dr Reatha Armour- here for f/u on spasticity and possible MS flare.   Dr Felecia Shelling has has never ordered steroids at home- did have infusion center at office in [place] in past, but not lately.  I think pt having MS flare?  and I have paged Neurology- d/w Dr Lorrin Goodell- he agreed that pt needs to get to ER to get MRIs- not sure if would give steroids, but exam is much weaker.  3. Needs MRI of C and T spine- asap-cannot wait til can be done outpatient-  will need to get via ER unless Dr Felecia Shelling can get early- . With and without contrast.   4.  Spent a long time going over need to go to ED- to get MRI's- but up to them- cannot direct admit.   5. BP is 115/76- doesn't need meds for orthostatic hypotension.   6. Pt is on CORRECT dose of spasticity meds- Zanaflex 8 mg TID, Baclofen 40 mg TID, and Valium 7.5 mg  TID- since her ITB pump was removed- and please don't reduce these doses without calling and speaking to me directly.  -  7. F/U in  3 months- double appt- MS   8. 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  Hasn't seen Urology lately. Will refer to Dr Matilde Sprang.   9. Will refill Oxycodone when needs it. Last refilled Oxycodone 10 mg -make sure don't mix as much with valium. Refilled Valium 7.5 mg TID #140 5 refills.  10. Baclofen is 40 mg TID, however have another 40 mg daily can take IF NEED BE!   I have spent a total of 45 minutes  on total visit- as detailed above- a long time d/w neurology.

## 2021-06-16 NOTE — Patient Instructions (Addendum)
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 Hoarseness AND dysarthria; not just 1 issue. Insomnia and ITB refills  ITB pump removed due to pump infection 11/10 by Dr Reatha Armour- here for f/u on spasticity and possible MS flare.   Dr Felecia Shelling has has never ordered steroids at home- did have infusion center at office in [place] in past, but not lately.  I think pt having MS flare?  and I have paged Neurology- d/w Dr Lorrin Goodell- he agreed that pt needs to get to ER to get MRIs- not sure if would give steroids, but exam is much weaker.  3. Needs MRI of C and T spine- asap-cannot wait til can be done outpatient-  will need to get via ER unless Dr Felecia Shelling can get early- . With and without contrast.   4.  Spent a long time going over need to go to ED- to get MRI's- but up to them- cannot direct admit.   5. BP is 115/76- doesn't need meds for orthostatic hypotension.   6. Pt is on CORRECT dose of spasticity meds- Zanaflex 8 mg TID, Baclofen 40 mg TID, and Valium 7.5 mg  TID- since her ITB pump was removed- and please don't reduce these doses without calling and speaking to me directly.  -  7. F/U in  3 months- double appt- MS  8. 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  Hasn't seen Urology lately. Will refer to Dr Matilde Sprang.   9. Will refill Oxycodone when needs it. Last refilled Oxycodone 10 mg -make sure don't mix as much with valium.   10. Baclofen is 40 mg TID, however have another 40 mg daily can take IF NEED BE!

## 2021-06-17 ENCOUNTER — Observation Stay (HOSPITAL_COMMUNITY)
Admission: EM | Admit: 2021-06-17 | Discharge: 2021-06-18 | Disposition: A | Discharge status: Home / Self Care | Payer: BLUE CROSS/BLUE SHIELD | Attending: Internal Medicine | Admitting: Internal Medicine

## 2021-06-17 ENCOUNTER — Encounter (HOSPITAL_COMMUNITY): Payer: Self-pay

## 2021-06-17 ENCOUNTER — Other Ambulatory Visit: Payer: Self-pay

## 2021-06-17 ENCOUNTER — Emergency Department (HOSPITAL_COMMUNITY): Payer: BLUE CROSS/BLUE SHIELD

## 2021-06-17 DIAGNOSIS — I959 Hypotension, unspecified: Secondary | ICD-10-CM | POA: Diagnosis present

## 2021-06-17 DIAGNOSIS — R1310 Dysphagia, unspecified: Secondary | ICD-10-CM | POA: Diagnosis not present

## 2021-06-17 DIAGNOSIS — R531 Weakness: Secondary | ICD-10-CM

## 2021-06-17 DIAGNOSIS — G35 Multiple sclerosis: Secondary | ICD-10-CM | POA: Diagnosis present

## 2021-06-17 DIAGNOSIS — G8220 Paraplegia, unspecified: Secondary | ICD-10-CM | POA: Diagnosis not present

## 2021-06-17 DIAGNOSIS — Z79899 Other long term (current) drug therapy: Secondary | ICD-10-CM | POA: Insufficient documentation

## 2021-06-17 DIAGNOSIS — F17210 Nicotine dependence, cigarettes, uncomplicated: Secondary | ICD-10-CM | POA: Diagnosis not present

## 2021-06-17 DIAGNOSIS — R252 Cramp and spasm: Secondary | ICD-10-CM | POA: Diagnosis present

## 2021-06-17 DIAGNOSIS — J45909 Unspecified asthma, uncomplicated: Secondary | ICD-10-CM | POA: Insufficient documentation

## 2021-06-17 DIAGNOSIS — Z20822 Contact with and (suspected) exposure to covid-19: Secondary | ICD-10-CM | POA: Insufficient documentation

## 2021-06-17 DIAGNOSIS — I9589 Other hypotension: Secondary | ICD-10-CM | POA: Diagnosis not present

## 2021-06-17 LAB — I-STAT VENOUS BLOOD GAS, ED
Acid-Base Excess: 4 mmol/L — ABNORMAL HIGH (ref 0.0–2.0)
Bicarbonate: 30.7 mmol/L — ABNORMAL HIGH (ref 20.0–28.0)
Calcium, Ion: 1.27 mmol/L (ref 1.15–1.40)
HCT: 36 % (ref 36.0–46.0)
Hemoglobin: 12.2 g/dL (ref 12.0–15.0)
O2 Saturation: 84 %
Potassium: 4 mmol/L (ref 3.5–5.1)
Sodium: 141 mmol/L (ref 135–145)
TCO2: 32 mmol/L (ref 22–32)
pCO2, Ven: 56.7 mmHg (ref 44.0–60.0)
pH, Ven: 7.341 (ref 7.250–7.430)
pO2, Ven: 52 mmHg — ABNORMAL HIGH (ref 32.0–45.0)

## 2021-06-17 LAB — BASIC METABOLIC PANEL
Anion gap: 7 (ref 5–15)
BUN: 13 mg/dL (ref 6–20)
CO2: 28 mmol/L (ref 22–32)
Calcium: 9.3 mg/dL (ref 8.9–10.3)
Chloride: 104 mmol/L (ref 98–111)
Creatinine, Ser: 0.79 mg/dL (ref 0.44–1.00)
GFR, Estimated: >60 mL/min (ref >60)
Glucose, Bld: 138 mg/dL — ABNORMAL HIGH (ref 70–99)
Potassium: 3.8 mmol/L (ref 3.5–5.1)
Sodium: 139 mmol/L (ref 135–145)

## 2021-06-17 LAB — URINALYSIS, ROUTINE W REFLEX MICROSCOPIC
Bilirubin Urine: NEGATIVE
Glucose, UA: NEGATIVE mg/dL
Hgb urine dipstick: NEGATIVE
Ketones, ur: NEGATIVE mg/dL
Nitrite: NEGATIVE
Protein, ur: NEGATIVE mg/dL
Specific Gravity, Urine: 1.008 (ref 1.005–1.030)
pH: 6 (ref 5.0–8.0)

## 2021-06-17 LAB — CBC
HCT: 37.4 % (ref 36.0–46.0)
Hemoglobin: 12.2 g/dL (ref 12.0–15.0)
MCH: 31.3 pg (ref 26.0–34.0)
MCHC: 32.6 g/dL (ref 30.0–36.0)
MCV: 95.9 fL (ref 80.0–100.0)
Platelets: 216 10*3/uL (ref 150–400)
RBC: 3.9 MIL/uL (ref 3.87–5.11)
RDW: 12.6 % (ref 11.5–15.5)
WBC: 10.1 10*3/uL (ref 4.0–10.5)
nRBC: 0 % (ref 0.0–0.2)

## 2021-06-17 LAB — I-STAT BETA HCG BLOOD, ED (MC, WL, AP ONLY): I-stat hCG, quantitative: <5 m[IU]/mL (ref <5)

## 2021-06-17 LAB — CBG MONITORING, ED
Glucose-Capillary: 106 mg/dL — ABNORMAL HIGH (ref 70–99)
Glucose-Capillary: 47 mg/dL — ABNORMAL LOW (ref 70–99)
Glucose-Capillary: 53 mg/dL — ABNORMAL LOW (ref 70–99)

## 2021-06-17 MED ORDER — LORAZEPAM 2 MG/ML IJ SOLN
1.0000 mg | Freq: Once | INTRAMUSCULAR | Status: AC
Start: 2021-06-17 — End: 2021-06-17
  Administered 2021-06-17: 1 mg via INTRAVENOUS
  Filled 2021-06-17: qty 1

## 2021-06-17 NOTE — ED Provider Notes (Signed)
[facility] EMERGENCY DEPARTMENT Provider Note   CSN: [id number] Arrival date & time: 06/17/21  1439     History  Chief Complaint  Patient presents with   Weakness    Multiple Sclerosis    Lexee Boldon is a 51 y.o. female who presents emergency department with a chief complaint of spasticity.  Patient has a history of MS and was sent here by her physiatrist for evaluation of increased spasticity on the left.  I reviewed notes from her physiatrist in the chart.  She recommends MRI of the thoracic and C-spine after consultation with on-call neuro hospitalist Dr. Annice Pih.  Additional history obtained by husband at bedside.   Weakness     Home Medications Prior to Admission medications   Medication Sig Start Date End Date Taking? Authorizing Provider  acetaminophen (TYLENOL) 500 MG tablet Take 1,000 mg by mouth every 6 (six) hours as needed (pain).    [provider]  albuterol (VENTOLIN HFA) 108 (90 Base) MCG/ACT inhaler Inhale 1-2 puffs into the lungs every 6 (six) hours as needed for wheezing or shortness of breath. 04/15/19   Angiulli, Lavon Paganini, PA-C  baclofen (LIORESAL) 20 MG tablet Take 2 tablets (40 mg total) by mouth 4 (four) times daily. Since ITB pump out Patient taking differently: Take 40 mg by mouth 3 (three) times daily. Since ITB pump out 04/17/21   Lovorn, Jinny Blossom, MD  buPROPion (WELLBUTRIN XL) 300 MG 24 hr tablet TAKE 1 TABLET DAILY Patient taking differently: Take 300 mg by mouth daily. 04/20/20   Lovorn, Jinny Blossom, MD  carboxymethylcellulose (REFRESH PLUS) 0.5 % SOLN Place 1 drop into both eyes 3 (three) times daily as needed (dry eyes).    [provider]  Cholecalciferol (VITAMIN D-3) 125 MCG (5000 UT) TABS Take 5,000 Units by mouth daily. 04/15/19   Angiulli, Lavon Paganini, PA-C  Cyanocobalamin (VITAMIN B-12) 2500 MCG SUBL Place 1 tablet (2,500 mcg total) under the tongue daily. 04/15/19   Angiulli, Lavon Paganini, PA-C  dalfampridine 10  MG TB12 Take 1 tablet (10 mg total) by mouth 2 (two) times daily. One po bid Patient taking differently: Take 10 mg by mouth 2 (two) times daily. 03/16/21   Sater, Nanine Means, MD  diazepam (VALIUM) 5 MG tablet Take 1.5 tablets (7.5 mg total) by mouth every 8 (eight) hours. 06/16/21   Lovorn, Jinny Blossom, MD  midodrine (PROAMATINE) 5 MG tablet Take 1 tablet (5 mg total) by mouth 3 (three) times daily with meals. Patient not taking: Reported on 06/16/2021 06/09/21 07/09/21  Shelly Coss, MD  montelukast (SINGULAIR) 10 MG tablet TAKE 1 TABLET DAILY Patient taking differently: Take 10 mg by mouth daily. 04/20/20   Lovorn, Jinny Blossom, MD  Oxycodone HCl 10 MG TABS Take 1 tablet (10 mg total) by mouth every 4 (four) hours as needed. 03/17/21   Bayard Hugger, NP  oxymetazoline (AFRIN) 0.05 % nasal spray Place 1 spray into both nostrils 2 (two) times daily as needed for congestion.    [provider]  polyethylene glycol (MIRALAX / GLYCOLAX) 17 g packet Take 17 g by mouth daily as needed for moderate constipation.    [provider]  raNITIdine HCl (ZANTAC PO) Take 10 mg by mouth daily.    [provider]  senna-docusate (SENOKOT-S) 8.6-50 MG tablet Take 1 tablet by mouth daily. Patient taking differently: Take 1-2 tablets by mouth 2 (two) times daily as needed for mild constipation. 04/27/19   Raulkar, Clide Deutscher, MD  tiZANidine (  ZANAFLEX) 4 MG tablet Take 2 tablets (8 mg total) by mouth 3 (three) times daily. 04/17/21   Lovorn, Jinny Blossom, MD  trospium (SANCTURA) 20 MG tablet Take 20 mg by mouth 2 (two) times daily.    [provider]  Wheat Dextrin (BENEFIBER) POWD Take 3.5 Scoops by mouth 2 (two) times daily.    [provider]      Allergies    Ziconotide acetate, Morphine, Amoxicillin, Morphine sulfate, and Pregabalin    Review of Systems   Review of Systems  Neurological:  Positive for weakness.   Physical Exam Updated Vital Signs BP 113/79    Pulse 92    Temp  98.9 F (37.2 C)    Resp (!) 21    Ht 5\' 9"  (1.753 m)    Wt 60.8 kg    SpO2 99%    BMI 19.79 kg/m  Physical Exam Vitals and nursing note reviewed.  Constitutional:      General: She is not in acute distress.    Appearance: She is well-developed. She is not diaphoretic.  HENT:     Head: Normocephalic and atraumatic.     Right Ear: External ear normal.     Left Ear: External ear normal.     Nose: Nose normal.     Mouth/Throat:     Mouth: Mucous membranes are moist.  Eyes:     General: No scleral icterus.    Conjunctiva/sclera: Conjunctivae normal.  Cardiovascular:     Rate and Rhythm: Normal rate and regular rhythm.     Heart sounds: Normal heart sounds. No murmur heard.   No friction rub. No gallop.  Pulmonary:     Effort: Pulmonary effort is normal. No respiratory distress.     Breath sounds: Normal breath sounds.  Abdominal:     General: Bowel sounds are normal. There is no distension.     Palpations: Abdomen is soft. There is no mass.     Tenderness: There is no abdominal tenderness. There is no guarding.  Musculoskeletal:     Cervical back: Normal range of motion.  Skin:    General: Skin is warm and dry.  Neurological:     Mental Status: She is alert and oriented to person, place, and time.     Comments: Voice is hoarse, speech is dysarthric Left sided Hemi spasticity present  Psychiatric:        Behavior: Behavior normal.    ED Results / Procedures / Treatments   Labs (all labs ordered are listed, but only abnormal results are displayed) Labs Reviewed  BASIC METABOLIC PANEL - Abnormal; Notable for the following components:      Result Value   Glucose, Bld 138 (*)    All other components within normal limits  CBG MONITORING, ED - Abnormal; Notable for the following components:   Glucose-Capillary 53 (*)    All other components within normal limits  I-STAT VENOUS BLOOD GAS, ED - Abnormal; Notable for the following components:   pO2, Ven 52.0 (*)    Bicarbonate  30.7 (*)    Acid-Base Excess 4.0 (*)    All other components within normal limits  CBG MONITORING, ED - Abnormal; Notable for the following components:   Glucose-Capillary 47 (*)    All other components within normal limits  CBG MONITORING, ED - Abnormal; Notable for the following components:   Glucose-Capillary 106 (*)    All other components within normal limits  CBC  URINALYSIS, ROUTINE W REFLEX MICROSCOPIC  I-STAT  BETA HCG BLOOD, ED ([facility], [facility], [facility] ONLY)    EKG None  Radiology No results found.  Procedures Procedures    Medications Ordered in ED Medications - No data to display  ED Course/ Medical Decision Making/ A&P Clinical Course as of 06/17/21 1845  Sat Jun 17, 2021  1745 Glucose-Capillary(!): 106 [AH]  1844 I-Stat venous blood gas, [facility] ED)(!) [AH]  0000000 Basic metabolic panel(!) [AH]  0000000 Glucose(!): 138 [AH]  1844 CBC Labs reviewed- mildly elevated blood glucose [AH]    Clinical Course User Index [AH] Margarita Mail, PA-C                           Medical Decision Making Amount and/or Complexity of Data Reviewed Labs: ordered. Decision-making details documented in ED Course. Radiology: ordered.  Risk Prescription drug management. Decision regarding hospitalization.   Patient here with hyperspasticity currently awaiting MRI images.  Labs reviewed and interpreted as per clinical course. Handoff given to PA [facility] at shift change.  Plan is for admission if MRIs show active MS flare otherwise plan is to discharge with close follow-up with patient's physiatrist. Final Clinical Impression(s) / ED Diagnoses Final diagnoses:  None    Rx / DC Orders ED Discharge Orders     None         Margarita Mail, PA-C 06/18/21 2001    Gareth Morgan, MD 06/20/21 2249

## 2021-06-17 NOTE — ED Notes (Signed)
Got patient into a gown on the monitor patient is resting with call bell in reach 

## 2021-06-17 NOTE — ED Notes (Signed)
Patient transported to MRI 

## 2021-06-17 NOTE — ED Triage Notes (Addendum)
Patient arrives via POV for evaluation of worsening muscle spasms on her left side. Patient was sent here by her physicians to have an MRI and CT of spine and possible steroids. Patient was recently admitted here on 1/12 and discharged (treated for uti as well, completed antibiotics at home).   **Per patient's physician, Dr. Dagoberto Ligas, do not make any adjustments in patients spasticity meds without reaching out to her first.

## 2021-06-17 NOTE — ED Notes (Signed)
CBG of 106 is the correct CBG. Disregard earlier CBG's.

## 2021-06-18 ENCOUNTER — Observation Stay (HOSPITAL_COMMUNITY): Payer: BLUE CROSS/BLUE SHIELD

## 2021-06-18 ENCOUNTER — Emergency Department (HOSPITAL_COMMUNITY): Payer: BLUE CROSS/BLUE SHIELD

## 2021-06-18 ENCOUNTER — Telehealth: Payer: Self-pay | Admitting: Student

## 2021-06-18 DIAGNOSIS — G35 Multiple sclerosis: Secondary | ICD-10-CM | POA: Diagnosis not present

## 2021-06-18 DIAGNOSIS — R1310 Dysphagia, unspecified: Secondary | ICD-10-CM

## 2021-06-18 DIAGNOSIS — I959 Hypotension, unspecified: Secondary | ICD-10-CM | POA: Diagnosis not present

## 2021-06-18 DIAGNOSIS — G8220 Paraplegia, unspecified: Secondary | ICD-10-CM

## 2021-06-18 DIAGNOSIS — T17500A Unspecified foreign body in bronchus causing asphyxiation, initial encounter: Secondary | ICD-10-CM

## 2021-06-18 LAB — RESP PANEL BY RT-PCR (FLU A&B, COVID) ARPGX2
Influenza A by PCR: NEGATIVE
Influenza B by PCR: NEGATIVE
SARS Coronavirus 2 by RT PCR: NEGATIVE

## 2021-06-18 LAB — LACTIC ACID, PLASMA: Lactic Acid, Venous: 0.9 mmol/L (ref 0.5–1.9)

## 2021-06-18 LAB — PROCALCITONIN: Procalcitonin: <0.1 ng/mL

## 2021-06-18 MED ORDER — SODIUM CHLORIDE 0.9 % IV SOLN: 2.0000 g | INTRAVENOUS | Status: DC

## 2021-06-18 MED ORDER — ENOXAPARIN SODIUM 40 MG/0.4ML IJ SOSY
40.0000 mg | PREFILLED_SYRINGE | INTRAMUSCULAR | Status: DC
  Administered 2021-06-18: 40 mg via SUBCUTANEOUS
  Filled 2021-06-18: qty 0.4

## 2021-06-18 MED ORDER — MONTELUKAST SODIUM 10 MG PO TABS
10.0000 mg | ORAL_TABLET | Freq: Every day | ORAL | Status: DC
  Administered 2021-06-18: 10 mg via ORAL
  Filled 2021-06-18: qty 1

## 2021-06-18 MED ORDER — DOXYCYCLINE HYCLATE 100 MG PO CAPS: 100.0000 mg | ORAL_CAPSULE | Freq: Two times a day (BID) | ORAL | 0 refills | Status: AC

## 2021-06-18 MED ORDER — MELATONIN 5 MG PO TABS: 10.0000 mg | ORAL_TABLET | Freq: Every evening | ORAL | Status: DC | PRN

## 2021-06-18 MED ORDER — BUPROPION HCL ER (XL) 150 MG PO TB24
300.0000 mg | ORAL_TABLET | Freq: Every day | ORAL | Status: DC
  Administered 2021-06-18: 300 mg via ORAL
  Filled 2021-06-18: qty 1

## 2021-06-18 MED ORDER — DIAZEPAM 5 MG PO TABS
7.5000 mg | ORAL_TABLET | Freq: Three times a day (TID) | ORAL | Status: DC
  Administered 2021-06-18: 7.5 mg via ORAL
  Filled 2021-06-18: qty 2

## 2021-06-18 MED ORDER — GADOBUTROL 1 MMOL/ML IV SOLN
10.0000 mL | Freq: Once | INTRAVENOUS | Status: AC | PRN
  Administered 2021-06-18: 10 mL via INTRAVENOUS

## 2021-06-18 MED ORDER — ACETAMINOPHEN 650 MG RE SUPP: 650.0000 mg | Freq: Four times a day (QID) | RECTAL | Status: DC | PRN

## 2021-06-18 MED ORDER — SENNOSIDES-DOCUSATE SODIUM 8.6-50 MG PO TABS
2.0000 | ORAL_TABLET | Freq: Every day | ORAL | Status: DC
  Administered 2021-06-18: 2 via ORAL
  Filled 2021-06-18: qty 2

## 2021-06-18 MED ORDER — ONDANSETRON HCL 4 MG/2ML IJ SOLN: 4.0000 mg | Freq: Four times a day (QID) | INTRAMUSCULAR | Status: DC | PRN

## 2021-06-18 MED ORDER — OXYCODONE HCL 5 MG PO TABS
10.0000 mg | ORAL_TABLET | ORAL | Status: DC | PRN
  Administered 2021-06-18: 10 mg via ORAL
  Filled 2021-06-18: qty 2

## 2021-06-18 MED ORDER — TROSPIUM CHLORIDE 20 MG PO TABS
20.0000 mg | ORAL_TABLET | Freq: Two times a day (BID) | ORAL | Status: DC
  Administered 2021-06-18: 20 mg via ORAL

## 2021-06-18 MED ORDER — DOXYCYCLINE HYCLATE 100 MG PO TABS
100.0000 mg | ORAL_TABLET | Freq: Once | ORAL | Status: AC
  Administered 2021-06-18: 100 mg via ORAL
  Filled 2021-06-18: qty 1

## 2021-06-18 MED ORDER — ALBUTEROL SULFATE (2.5 MG/3ML) 0.083% IN NEBU: 2.5000 mg | INHALATION_SOLUTION | RESPIRATORY_TRACT | Status: DC | PRN

## 2021-06-18 MED ORDER — DALFAMPRIDINE ER 10 MG PO TB12
10.0000 mg | ORAL_TABLET | Freq: Two times a day (BID) | ORAL | Status: DC
  Administered 2021-06-18: 10 mg via ORAL

## 2021-06-18 MED ORDER — ACETAMINOPHEN 325 MG PO TABS: 650.0000 mg | ORAL_TABLET | Freq: Four times a day (QID) | ORAL | Status: DC | PRN

## 2021-06-18 MED ORDER — TIZANIDINE HCL 2 MG PO TABS
8.0000 mg | ORAL_TABLET | Freq: Three times a day (TID) | ORAL | Status: DC
  Administered 2021-06-18: 8 mg via ORAL
  Filled 2021-06-18: qty 2

## 2021-06-18 MED ORDER — SODIUM CHLORIDE 0.9 % IV BOLUS
1000.0000 mL | Freq: Once | INTRAVENOUS | Status: AC
  Administered 2021-06-18: 1000 mL via INTRAVENOUS

## 2021-06-18 MED ORDER — NICOTINE 14 MG/24HR TD PT24
14.0000 mg | MEDICATED_PATCH | Freq: Every day | TRANSDERMAL | Status: DC
  Administered 2021-06-18: 14 mg via TRANSDERMAL
  Filled 2021-06-18: qty 1

## 2021-06-18 MED ORDER — BACLOFEN 10 MG PO TABS
40.0000 mg | ORAL_TABLET | Freq: Three times a day (TID) | ORAL | Status: DC
  Administered 2021-06-18: 40 mg via ORAL
  Filled 2021-06-18 (×3): qty 2

## 2021-06-18 MED ORDER — ONDANSETRON HCL 4 MG PO TABS: 4.0000 mg | ORAL_TABLET | Freq: Four times a day (QID) | ORAL | Status: DC | PRN

## 2021-06-18 MED ORDER — LACTATED RINGERS IV SOLN: INTRAVENOUS | Status: AC

## 2021-06-18 MED ORDER — SODIUM CHLORIDE 0.9 % IV SOLN: 500.0000 mg | INTRAVENOUS | Status: DC

## 2021-06-18 NOTE — Assessment & Plan Note (Signed)
Chronic. 

## 2021-06-18 NOTE — ED Notes (Signed)
Pt's bedding changed. 

## 2021-06-18 NOTE — Progress Notes (Signed)
Brief Neuro Update:  MRIs C and T spine with and without contrast with no new enhancing lesions. Likely this is fluctuation of her MS symptoms rather than a flare up. Treating underlying metabolic/infectious etiologies is recommended along with PT and OT. No indication for steroids at this time.  We will signoff. Please feel free to contact us with any questions or concerns.  [place] Pager Number [phone]

## 2021-06-18 NOTE — Consult Note (Signed)
Neurology Consultation Reason for Consult: Left-sided weakness Referring Physician: Radene Knee, ED  CC: Left-sided spasms  History is obtained from: Patient  HPI: Sharon Odom is a 51 y.o. female with a history of multiple sclerosis who presents with left-sided spasms.  She takes baclofen and Zanaflex as well as diazepam.  Over the past week, she has had increasing spasms of her left side.  She has chronic left-sided weakness presumed to have been a problem, but it has been particularly bad over the past week.  She presented on 1/12 with similar symptoms in the time had an MRI of her brain with and without contrast which was negative as well as a UA which showed evidence of a UTI.  She was started on Cipro.  Today, her urinalysis continues to show bacteria with leukocytes.  She also complains of some dysphagia and there is some concern for aspiration pneumonia, this is being evaluated by internal medicine.    Past Medical History:  Diagnosis Date   Allergies    Anxiety    Arthritis    Asthma    seasonal - pollen   Constipation    Depression    Dyspnea    with exertion   GERD (gastroesophageal reflux disease)    Headache    History of hiatal hernia    Left radial nerve palsy 03/15/2020   resolved - Left arm/hand weak   Multiple sclerosis ([place])    Pneumonia 2007   x 1   Vision abnormalities    Wears glasses      Family History  Problem Relation Age of Onset   Diabetes Mother    Healthy Brother      Social History:  reports that she has been smoking cigarettes. She started smoking about 35 years ago. She has a 16.00 pack-year smoking history. She has never used smokeless tobacco. She reports that she does not currently use alcohol. She reports that she does not use drugs.   Exam: Current vital signs: BP 101/73    Pulse 77    Temp 98.9 F (37.2 C)    Resp 15    Ht 5\' 9"  (1.753 m)    Wt 60.8 kg    SpO2 98%    BMI 19.79 kg/m  Vital signs in last 24 hours: Temp:  [98.9 F  (37.2 C)] 98.9 F (37.2 C) (01/21 1500) Pulse Rate:  [63-134] 77 (01/22 0400) Resp:  [11-21] 15 (01/22 0400) BP: (86-123)/(50-103) 101/73 (01/22 0400) SpO2:  [90 %-100 %] 98 % (01/22 0400) Weight:  [60.8 kg] 60.8 kg (01/21 1627)   Physical Exam  Constitutional: Appears well-developed and well-nourished.  Psych: Affect appropriate to situation Eyes: No scleral injection HENT: No OP obstruction MSK: no joint deformities.  Cardiovascular: Normal rate and regular rhythm.  Respiratory: Effort normal, non-labored breathing GI: Soft.  No distension. There is no tenderness.  Skin: WDI  Neuro: Mental Status: Patient is awake, alert, oriented to person, place, month, year, and situation. Patient is able to give a clear and coherent history. No signs of aphasia or neglect Cranial Nerves: II: Visual Fields are full. Pupils are equal, round, and reactive to light.   III,IV, VI: She has an INO with impaired adduction of the left eye on rightward gaze, less prominent on leftward gaze  V: Facial sensation is symmetric to temperature VII: Facial movement is symmetric.  VIII: hearing is intact to voice Motor: She has mildly increased tone left arm she has 4/5 weakness in the proximal left  arm, weaker distally.  The left leg she is 4/5, 4+/5 in the right leg Sensory: Intact light touch Cerebellar: FNF and HKS are intact bilaterally   I have reviewed labs in epic and the results pertinent to this consultation are: UA-11-20 WBC, rare bacteria BMP-unremarkable Magnesium-1.7  I have reviewed the images obtained: MRI brain, C-spine, T-spine-no enhancing lesions  Impression: 51 year old female with what I suspect is a pseudo exacerbation in the setting of her urinary tract infection.  It may take longer to resolve, and she may need some physical therapy but I do not think I would pursue steroids at this time.  I would not increase her baclofen, Zanaflex, or diazepam given her soft pressures  after Ativan earlier.  Recommendations: 1) continue baclofen, Zanaflex, diazepam at home doses 2) treatment of UTI, evaluation for pneumonia per internal medicine 3) physical therapy evaluation.    Roland Rack, MD [facility] [phone]  If 7pm- 7am, please page neurology on call as listed in [place].

## 2021-06-18 NOTE — Discharge Summary (Signed)
Physician Discharge Summary  Sharon Odom [id number] DOB: July 22, 1970 DOA: 06/17/2021  PCP: Clovia Cuff, MD  Admit date: 06/17/2021 Discharge date: 06/18/2021  Admitted From: home  Disposition:  home   Recommendations for Outpatient Follow-up:  F/u on CT findings please     CODE STATUS:  full code     Consultations: Neurology  Pulmonary       Discharge Diagnoses:  Principal Problem:   Transient hypotension Active Problems:   Dysphagia   Multiple sclerosis ([place])     Brief Summary: Is a 51 year old female with multiple sclerosis who is a smoker and presents to the hospital for worsening muscle spasms on her left side.  She was sent to the ED by her physicians to have further work-up.  While having work-up in the ED, IV Ativan was given and she subsequently became hypotensive but not symptomatic.  Despite fluid resuscitation  Hospital Course:  Transient hypotension - Due to Ativan and has resolved  Left-sided spasms with a history of multiple sclerosis - She recently had her baclofen pump removed and she tells me that she has mostly been in a wheelchair since then - She has had further imaging as noted below-neurology has evaluated her and does not feel that this is an exacerbation of her MS-she does not need any change in her medications  Cough with congestion-abnormal CT Chronic smoker - She does admit to a worsening cough with thick sputum which is either clear or pale - pulse ox is in the high 90s on my exam- she does have a cough that sounds congested and rhonchi - CT of the chest reveals segmental occlusion of the left lower lobe bronchus which could be infection or aspiration or an endobronchial mass in addition to confluent left lower lobe opacity which may be combination of pneumonia or atelectasis - I have asked pulmonary to evaluate her and have been given the following recommendations by Dr Verlee Monte: 7-day course of doxycycline, albuterol 2 puffs twice a day  followed by flutter valve 10 slow but firm puffs twice a day -Pulmonary also recommends a CT of the chest in 4 weeks and plans to follow her up in their clinic  Dysphagia - Modified barium swallow completed today by SLP and nectar thick liquid recommended with hold meds in pure-    Discharge Exam: Vitals:   06/18/21 1130 06/18/21 1355  BP: 92/64 125/79  Pulse: 76 80  Resp: 17 19  Temp:  98.4 F (36.9 C)  SpO2: 99% 100%   Vitals:   06/18/21 1100 06/18/21 1115 06/18/21 1130 06/18/21 1355  BP: 95/73 93/63 92/64  125/79  Pulse: 76 74 76 80  Resp: 20 15 17 19   Temp:    98.4 F (36.9 C)  TempSrc:    Oral  SpO2: 99% 97% 99% 100%  Weight:      Height:        General: Pt is alert, awake, not in acute distress Cardiovascular: RRR, S1/S2 +, no rubs, no gallops Respiratory: CTA bilaterally, no wheezing, no rhonchi Abdominal: Soft, NT, ND, bowel sounds + Extremities: no edema, no cyanosis   Discharge Instructions   Allergies as of 06/18/2021       Reactions   Ziconotide Acetate Shortness Of Breath   Anorexia, weird sensations, could not talk, choking feeling, lost since of taste and smell. Hallucinations, vivid dreams. Confusion and sleepiness. N/v, increase in pain.  Anorexia, weird sensations, could not talk, choking feeling, lost since of taste and smell. Hallucinations, vivid dreams.  Confusion and sleepiness. N/v, increase in pain.    Morphine Other (See Comments)   Pt does not recall reaction    Amoxicillin Nausea And Vomiting   Morphine Sulfate Other (See Comments)   Unknown   Pregabalin Nausea And Vomiting        Medication List     TAKE these medications    acetaminophen 500 MG tablet Commonly known as: TYLENOL Take 1,000 mg by mouth every 6 (six) hours as needed (pain).   albuterol 108 (90 Base) MCG/ACT inhaler Commonly known as: VENTOLIN HFA Inhale 1-2 puffs into the lungs every 6 (six) hours as needed for wheezing or shortness of breath.   baclofen 20  MG tablet Commonly known as: LIORESAL Take 2 tablets (40 mg total) by mouth 4 (four) times daily. Since ITB pump out What changed: when to take this   Benefiber Powd Take 3.5 Scoops by mouth 2 (two) times daily.   buPROPion 300 MG 24 hr tablet Commonly known as: WELLBUTRIN XL TAKE 1 TABLET DAILY   carboxymethylcellulose 0.5 % Soln Commonly known as: REFRESH PLUS Place 1 drop into both eyes 3 (three) times daily as needed (dry eyes).   dalfampridine 10 MG Tb12 Take 1 tablet (10 mg total) by mouth 2 (two) times daily. One po bid What changed: additional instructions   diazepam 5 MG tablet Commonly known as: VALIUM Take 1.5 tablets (7.5 mg total) by mouth every 8 (eight) hours.   doxycycline 100 MG capsule Commonly known as: VIBRAMYCIN Take 1 capsule (100 mg total) by mouth 2 (two) times daily for 7 days. Start taking on: June 19, 2021   montelukast 10 MG tablet Commonly known as: SINGULAIR TAKE 1 TABLET DAILY   Oxycodone HCl 10 MG Tabs Take 1 tablet (10 mg total) by mouth every 4 (four) hours as needed. What changed: reasons to take this   oxymetazoline 0.05 % nasal spray Commonly known as: AFRIN Place 1 spray into both nostrils 2 (two) times daily as needed for congestion.   polyethylene glycol 17 g packet Commonly known as: MIRALAX / GLYCOLAX Take 17 g by mouth every other day.   senna-docusate 8.6-50 MG tablet Commonly known as: Senokot-S Take 1 tablet by mouth daily. What changed: how much to take   tiZANidine 4 MG tablet Commonly known as: ZANAFLEX Take 2 tablets (8 mg total) by mouth 3 (three) times daily.   trospium 20 MG tablet Commonly known as: SANCTURA Take 20 mg by mouth 2 (two) times daily.   Vitamin B-12 2500 MCG Subl Place 1 tablet (2,500 mcg total) under the tongue daily.   Vitamin D-3 125 MCG (5000 UT) Tabs Take 5,000 Units by mouth daily.   ZANTAC PO Take 10 mg by mouth daily.        Follow-up Information     Maryjane Hurter, MD Follow up.   Specialty: Pulmonary Disease Why: Please call them if they do not call you to make an appointment Contact information: [place] [address] [phone]                Allergies  Allergen Reactions   Ziconotide Acetate Shortness Of Breath    Anorexia, weird sensations, could not talk, choking feeling, lost since of taste and smell. Hallucinations, vivid dreams. Confusion and sleepiness. N/v, increase in pain.  Anorexia, weird sensations, could not talk, choking feeling, lost since of taste and smell. Hallucinations, vivid dreams. Confusion and sleepiness. N/v, increase in pain.  Morphine Other (See Comments)    Pt does not recall reaction    Amoxicillin Nausea And Vomiting   Morphine Sulfate Other (See Comments)    Unknown   Pregabalin Nausea And Vomiting      CT Chest Wo Contrast  Result Date: 06/18/2021 CLINICAL DATA:  51 year old female with multiple sclerosis, increasing muscle spasm, transient hypotension. Left lung base abnormality on thoracic spine MRI yesterday, query pneumonia. EXAM: CT CHEST WITHOUT CONTRAST TECHNIQUE: Multidetector CT imaging of the chest was performed following the standard protocol without IV contrast. RADIATION DOSE REDUCTION: This exam was performed according to the departmental dose-optimization program which includes automated exposure control, adjustment of the mA and/or kV according to patient size and/or use of iterative reconstruction technique. COMPARISON:  Thoracic spine MRI 06/18/2021. FINDINGS: Cardiovascular: No cardiomegaly or pericardial effusion. Negative noncontrast thoracic aorta. Mediastinum/Nodes: Small, reactive appearing mediastinal lymph nodes. Lungs/Pleura: Major airways are patent through the mainstem bronchi, but the left lower lobe bronchus is segmentally occluded on series 6, image 79. There is confluent opacity in the left lower lobe, partially dependent and peribronchial. Less pronounced  deep and opacity in the right lower lobe, with no airway opacification on that side. No pleural effusion. Additional mild dependent atelectasis along both major fissures. Upper Abdomen: Negative visible noncontrast liver, gallbladder, spleen, pancreas, left adrenal gland, and bowel in the upper abdomen. Musculoskeletal: No acute or suspicious osseous lesion. IMPRESSION: 1. Segmental occlusion of the left lower lobe bronchus. This could reflect airway infection or aspiration, but endobronchial mass is difficult to exclude. And follow-up bronchoscopy should be considered if the left lung abnormality fails to resolve (see #2). 2. Associated confluent left lower lobe opacity is probably a combination of pneumonia and atelectasis. No pleural effusion. Superimposed atelectasis elsewhere. 3. Reactive appearing mediastinal lymph nodes. Electronically Signed   By: Genevie Ann M.D.   On: 06/18/2021 05:44   MR Brain W and Wo Contrast  Result Date: 06/08/2021 CLINICAL DATA:  Worsening weakness and slurred speech. Multiple sclerosis. EXAM: MRI HEAD WITHOUT AND WITH CONTRAST TECHNIQUE: Multiplanar, multiecho pulse sequences of the brain and surrounding structures were obtained without and with intravenous contrast. CONTRAST:  59mL GADAVIST GADOBUTROL 1 MMOL/ML IV SOLN COMPARISON:  02/02/2021 FINDINGS: Brain: Typical multiple sclerosis pattern with numerous T2 hyperintense plaques in the brainstem, cerebellum, periventricular white matter, and juxtacortical white matter. No new plaque, restricted diffusion, or enhancement. Brain volume is normal. No mass, hemorrhage, hydrocephalus, or collection. Vascular: Normal flow voids and vascular enhancements Skull and upper cervical spine: Normal marrow signal. 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 Sinuses/Orbits: Negative. IMPRESSION: Multiple sclerosis pattern without active or progressive finding since brain MRI 02/02/2021 Electronically Signed   By: Jorje Guild M.D.   On: 06/08/2021 04:26   MR Cervical Spine W or Wo Contrast  Result Date: 06/18/2021 CLINICAL DATA:  Initial evaluation for multiple sclerosis, worsening left-sided muscular spasms. EXAM: MRI CERVICAL SPINE WITHOUT AND WITH CONTRAST TECHNIQUE: Multiplanar and multiecho pulse sequences of the cervical spine, to include the craniocervical junction and cervicothoracic junction, were obtained without and with intravenous contrast. CONTRAST:  50mL GADAVIST GADOBUTROL 1 MMOL/ML IV SOLN COMPARISON:  Prior MRI from 02/02/2021. FINDINGS: Alignment: Straightening of the normal cervical lordosis. No listhesis or interval malalignment. Vertebrae: Vertebral body height maintained without acute or chronic fracture. Bone marrow signal intensity within normal limits. Benign hemangioma noted within the C7 vertebral body. No worrisome osseous lesions. No abnormal marrow edema or enhancement. Cord: Patchy signal abnormality again  seen involving the central aspect of the cord at the level of C2-3. The left dorsal cord at the level of C3-4. In the right dorsal cord at the level of C6. Findings consistent with demyelinating disease/multiple sclerosis, and are relatively stable as compared to previous MRI. No new lesions to suggest interval progression. No abnormal enhancement to suggest active demyelination. Normal cord caliber and morphology. Posterior Fossa, vertebral arteries, paraspinal tissues: Extensive multifocal signal abnormality noted involving the visualized brainstem and cervicomedullary junction, consistent with demyelinating disease. Craniocervical junction otherwise unremarkable. Paraspinous and prevertebral soft tissues within normal limits. Normal flow voids seen within the vertebral arteries bilaterally. Disc levels: C2-C3: Mild left-sided uncovertebral hypertrophy without significant disc bulge. Mild facet degeneration on the right. No canal or foraminal stenosis. C3-C4: Mild disc bulge with endplate and  uncovertebral spurring. Flattening of the ventral thecal sac without significant spinal stenosis. Mild left C4 foraminal narrowing. Right neural foramina remains patent. C4-C5: Mild disc bulge with right greater than left uncovertebral hypertrophy. Flattening of the ventral thecal sac without significant spinal stenosis. Mild-to-moderate right C5 foraminal stenosis. Left neural foramina remains patent. C5-C6: Mild intervertebral disc space narrowing with diffuse disc bulge. Associated endplate and uncovertebral spurring. Flattening and partial effacement of the ventral thecal sac with resultant mild spinal stenosis. No significant cord deformity. Moderate bilateral C6 foraminal narrowing, slightly worse on the right. C6-C7: Mild intervertebral disc space narrowing with diffuse disc bulge and bilateral uncovertebral hypertrophy. No significant spinal stenosis. Moderate left with mild right C7 foraminal stenosis. C7-T1:  Unremarkable. IMPRESSION: 1. Multifocal signal abnormality involving the cervical spinal cord as above, consistent with history of demyelinating disease/multiple sclerosis. No new lesions to suggest disease progression. No evidence for active demyelination. 2. Mild-to-moderate multilevel cervical spondylosis, most pronounced at C5-6 where there is resultant mild spinal stenosis. Mild to moderate right C5, with moderate bilateral C6 and left C7 foraminal stenosis related to disc bulge and uncovertebral disease. Electronically Signed   By: Jeannine Boga M.D.   On: 06/18/2021 02:15   MR THORACIC SPINE W WO CONTRAST  Result Date: 06/18/2021 CLINICAL DATA:  Initial evaluation for multiple sclerosis, worsening left-sided muscular spasms. EXAM: MRI THORACIC WITHOUT AND WITH CONTRAST TECHNIQUE: Multiplanar and multiecho pulse sequences of the thoracic spine were obtained without and with intravenous contrast. CONTRAST:  75mL GADAVIST GADOBUTROL 1 MMOL/ML IV SOLN COMPARISON:  Prior MRI from  03/31/2019. FINDINGS: Alignment: Vertebral bodies normally aligned with preservation of the normal thoracic kyphosis. No listhesis. Vertebrae: Vertebral body height maintained without acute or chronic fracture. Bone marrow signal intensity diffusely heterogeneous with multiple scattered benign hemangiomata noted. Probable small atypical hemangiomas again noted within the T5 and T10 vertebral bodies, stable. No worrisome osseous lesions. No abnormal marrow edema or enhancement. Cord: Normal signal and morphology. No cord signal changes to suggest demyelinating disease within the thoracic spinal cord. Normal cord caliber morphology. No abnormal enhancement. Paraspinal and other soft tissues: Paraspinous soft tissues within normal limits. Irregular parenchymal changes at the posterior left lung could reflect atelectasis or infiltrate. Visualized visceral structures otherwise unremarkable. Disc levels: No significant disc pathology seen within the thoracic spine. Discs are well hydrated with preserved disc space height. No disc bulge or focal disc herniation. No significant facet disease. No canal or neural foraminal stenosis or evidence for neural impingement. IMPRESSION: 1. Stable normal MRI of the thoracic spinal cord. No evidence for demyelinating disease. 2. No significant disc pathology, stenosis, or evidence for neural impingement. 3. Irregular opacity at the posterior left  lung, which could reflect atelectasis or infiltrate. Correlation with physical exam and chest x-ray recommended as clinically warranted. Electronically Signed   By: Jeannine Boga M.D.   On: 06/18/2021 02:31   DG Chest Portable 1 View  Result Date: 06/18/2021 CLINICAL DATA:  Opacity seen on MRI concerning for pneumonia. EXAM: PORTABLE CHEST 1 VIEW COMPARISON:  06/08/2021. FINDINGS: The heart size and mediastinal contours are within normal limits. Mild atelectasis is present at the lung bases. No consolidation, effusion, or  pneumothorax. No acute osseous abnormality. IMPRESSION: Mild atelectasis at the lung bases.  No acute abnormality. Electronically Signed   By: Brett Fairy M.D.   On: 06/18/2021 03:13   DG Chest Port 1 View  Result Date: 06/08/2021 CLINICAL DATA:  MS flare EXAM: PORTABLE CHEST 1 VIEW COMPARISON:  None. FINDINGS: Bibasilar opacities, likely atelectasis. Heart is normal size. No effusions. No acute bony abnormality. IMPRESSION: Bibasilar atelectasis. Electronically Signed   By: Rolm Baptise M.D.   On: 06/08/2021 02:23   DG Swallowing Func-Speech Pathology  Result Date: 06/18/2021 Table formatting from the original result was not included. Images from the original result were not included. Objective Swallowing Evaluation: Type of Study: MBS-Modified Barium Swallow Study  Patient Details Name: Sharon Odom MRN: [id number] Date of Birth: 1970/09/20 Today's Date: 06/18/2021 Time: SLP Start Time (ACUTE ONLY): 1039 -SLP Stop Time (ACUTE ONLY): 1057 SLP Time Calculation (min) (ACUTE ONLY): 18 min Past Medical History: Past Medical History: Diagnosis Date  Allergies   Anxiety   Arthritis   Asthma   seasonal - pollen  Constipation   Depression   Dyspnea   with exertion  GERD (gastroesophageal reflux disease)   Headache   History of hiatal hernia   Left radial nerve palsy 03/15/2020  resolved - Left arm/hand weak  Multiple sclerosis ([place])   Pneumonia 2007  x 1  Vision abnormalities   Wears glasses  Past Surgical History: Past Surgical History: Procedure Laterality Date  EXCISION MORTON'S NEUROMA Right   fallopian tube removal    INTRATHECAL PUMP IMPLANT Right 02/18/2019  Procedure: INTRATHECAL PUMP IMPLANT;  Surgeon: Clydell Hakim, MD;  Location: [facility];  Service: Neurosurgery;  Laterality: Right;  INTRATHECAL PUMP IMPLANT  INTRATHECAL PUMP IMPLANT Right 02/17/2021  Procedure: Baclofen Pump Replacement;  Surgeon: Erline Levine, MD;  Location: [facility];  Service: Neurosurgery;  Laterality: Right;  3C/RM 21  PAIN PUMP REMOVAL  Left 04/06/2021  Procedure: Removal of baclofen pump, Left;  Surgeon: Dawley, Theodoro Doing, DO;  Location: [facility];  Service: Neurosurgery;  Laterality: Left;  Lateral/Left//3C rm 19  UPPER GI ENDOSCOPY    WISDOM TOOTH EXTRACTION    WISDOM TOOTH EXTRACTION   HPI: Zakira Pahnke is a 51 y.o. female with a history of multiple sclerosis who presents with left-sided spasms.  She takes baclofen and Zanaflex as well as diazepam.  Over the past week, she has had increasing spasms of her left side. CXR 1/22: "Segmental occlusion of the left lower lobe bronchus. This could reflect airway infection or aspiration, but endobronchial mass is  difficult to exclude. And follow-up bronchoscopy should be considered if the left lung abnormality fails to resolve (see #2).    2. Associated confluent left lower lobe opacity is probably a combination of pneumonia and atelectasis. No pleural effusion.  Superimposed atelectasis elsewhere."  No data recorded  Recommendations for follow up therapy are one component of a multi-disciplinary discharge planning process, led by the attending physician.  Recommendations may be updated based on patient status,  additional functional criteria and insurance authorization. Assessment / Plan / Recommendation Clinical Impressions 06/18/2021 Clinical Impression Pt presents with a mild oropharyngeal dysphagia c/b effortful lingual manipulation of bolus and delayed AP transit, delayed swallow initiation, and diminished sensation.  These deficits resulted in silent aspiration of thin liquid before and during the swallow.  Cup presentation with increased aspiration over straw presentation as it placed neck in extended position v neutral positioning with straw.  Pt uses oral bolus hold, which was not beneficial to prevent penetration and aspiration.  A cued cough was partially beneficial, but did not clear aspiration.  There was no penetration or aspiration with nectar thick liquid by cup. There was no penetration or  aspiration with solid or puree consistencies.  Pt demonstrated no oral or pharyngeal stasis of barium tablet during pill simulation given with puree as she is accustomed to at home.  There was trace to no pharyngeal residue.    There was slight retention of contrast in cervical esophagus (see image below). Recommend regular texture diet and nectar thick liquid.  Pt needs follow up with her outpatient SLP to address swallow deficits and continue ongoing speech therapy.  Pt has used nectar thick liquid in the past, but it has not been successful for her.   Pt may benefit from repeat MBSS following any interventions to determine if pt can advance liquid consistency. Cervical esophageal retention of contrast:  SLP Visit Diagnosis Dysphagia, oropharyngeal phase (R13.12) Attention and concentration deficit following -- Frontal lobe and executive function deficit following -- Impact on safety and function Severe aspiration risk;Moderate aspiration risk   Treatment Recommendations 06/18/2021 Treatment Recommendations Therapy as outlined in treatment plan below   Prognosis 06/18/2021 Prognosis for Safe Diet Advancement Fair Barriers to Reach Goals (No Data) Barriers/Prognosis Comment -- Diet Recommendations 06/18/2021 SLP Diet Recommendations Regular solids;Nectar thick liquid Liquid Administration via Cup Medication Administration Whole meds with puree Compensations Slow rate;Small sips/bites Postural Changes Seated upright at 90 degrees   Other Recommendations 06/18/2021 Recommended Consults -- Oral Care Recommendations Oral care BID Other Recommendations Order thickener from pharmacy Follow Up Recommendations Outpatient SLP Assistance recommended at discharge None Functional Status Assessment Patient has had a recent decline in their functional status and demonstrates the ability to make significant improvements in function in a reasonable and predictable amount of time. Frequency and Duration  06/18/2021 Speech Therapy  Frequency (ACUTE ONLY) min 2x/week Treatment Duration 2 weeks   Oral Phase 06/18/2021 Oral Phase Impaired Oral - Pudding Teaspoon -- Oral - Pudding Cup -- Oral - Honey Teaspoon -- Oral - Honey Cup -- Oral - Nectar Teaspoon -- Oral - Nectar Cup Weak lingual manipulation Oral - Nectar Straw -- Oral - Thin Teaspoon -- Oral - Thin Cup Weak lingual manipulation Oral - Thin Straw Weak lingual manipulation Oral - Puree Delayed oral transit Oral - Mech Soft -- Oral - Regular Weak lingual manipulation Oral - Multi-Consistency -- Oral - Pill Weak lingual manipulation Oral Phase - Comment --  Pharyngeal Phase 06/18/2021 Pharyngeal Phase Impaired Pharyngeal- Pudding Teaspoon -- Pharyngeal -- Pharyngeal- Pudding Cup -- Pharyngeal -- Pharyngeal- Honey Teaspoon -- Pharyngeal -- Pharyngeal- Honey Cup -- Pharyngeal -- Pharyngeal- Nectar Teaspoon -- Pharyngeal -- Pharyngeal- Nectar Cup Delayed swallow initiation-vallecula Pharyngeal Material does not enter airway Pharyngeal- Nectar Straw -- Pharyngeal -- Pharyngeal- Thin Teaspoon -- Pharyngeal -- Pharyngeal- Thin Cup Delayed swallow initiation-pyriform sinuses;Penetration/Aspiration before swallow;Moderate aspiration;Penetration/Aspiration during swallow Pharyngeal Material enters airway, passes BELOW cords without attempt by patient to eject out (silent  aspiration) Pharyngeal- Thin Straw Delayed swallow initiation-pyriform sinuses;Penetration/Aspiration before swallow;Penetration/Aspiration during swallow;Trace aspiration Pharyngeal Material enters airway, passes BELOW cords without attempt by patient to eject out (silent aspiration) Pharyngeal- Puree Delayed swallow initiation-vallecula Pharyngeal Material does not enter airway Pharyngeal- Mechanical Soft -- Pharyngeal -- Pharyngeal- Regular Delayed swallow initiation-vallecula Pharyngeal Material does not enter airway Pharyngeal- Multi-consistency -- Pharyngeal -- Pharyngeal- Pill Delayed swallow initiation-vallecula Pharyngeal  Material does not enter airway Pharyngeal Comment --  Cervical Esophageal Phase  06/18/2021 Cervical Esophageal Phase Impaired Pudding Teaspoon -- Pudding Cup -- Honey Teaspoon -- Honey Cup -- Nectar Teaspoon -- Nectar Cup (No Data) Nectar Straw -- Thin Teaspoon -- Thin Cup -- Thin Straw -- Puree -- Mechanical Soft -- Regular -- Multi-consistency -- Pill -- Cervical Esophageal Comment -- Celedonio Savage, MA, CCC-SLP Acute Rehabilitation Services Office: [phone]; Pager (1/22): [phone] 06/18/2021, 11:38 AM                       The results of significant diagnostics from this hospitalization (including imaging, microbiology, ancillary and laboratory) are listed below for reference.     Microbiology: Recent Results (from the past 240 hour(s))  Blood culture (routine x 2)     Status: None (Preliminary result)   Collection Time: 06/18/21  3:17 AM   Specimen: BLOOD RIGHT FOREARM  Result Value Ref Range Status   Specimen Description BLOOD RIGHT FOREARM  Final   Special Requests   Final    BOTTLES DRAWN AEROBIC AND ANAEROBIC Blood Culture adequate volume   Culture   Final    NO GROWTH < 12 HOURS Performed at [facility], [place] [address], [place], [place] [address]    Report Status PENDING  Incomplete  Resp Panel by RT-PCR (Flu A&B, Covid)     Status: None   Collection Time: 06/18/21  3:19 AM   Specimen: Nasopharyngeal(NP) swabs in vial transport medium  Result Value Ref Range Status   SARS Coronavirus 2 by RT PCR NEGATIVE NEGATIVE Final    Comment: (NOTE) SARS-CoV-2 target nucleic acids are NOT DETECTED.  The SARS-CoV-2 RNA is generally detectable in upper respiratory specimens during the acute phase of infection. The lowest concentration of SARS-CoV-2 viral copies this assay can detect is 138 copies/mL. A negative result does not preclude SARS-Cov-2 infection and should not be used as the sole basis for treatment or other patient management decisions. A negative result may occur  with  improper specimen collection/handling, submission of specimen other than nasopharyngeal swab, presence of viral mutation(s) within the areas targeted by this assay, and inadequate number of viral copies(<138 copies/mL). A negative result must be combined with clinical observations, patient history, and epidemiological information. The expected result is Negative.  Fact Sheet for Patients:  EntrepreneurPulse.com.au  Fact Sheet for Healthcare Providers:  IncredibleEmployment.be  This test is no t yet approved or cleared by the [place] FDA and  has been authorized for detection and/or diagnosis of SARS-CoV-2 by FDA under an Emergency Use Authorization (EUA). This EUA will remain  in effect (meaning this test can be used) for the duration of the COVID-19 declaration under Section 564(b)(1) of the Act, 21 U.S.C.section 360bbb-3(b)(1), unless the authorization is terminated  or revoked sooner.       Influenza A by PCR NEGATIVE NEGATIVE Final   Influenza B by PCR NEGATIVE NEGATIVE Final    Comment: (NOTE) The Xpert Xpress SARS-CoV-2/FLU/RSV plus assay is intended as an aid in the diagnosis of influenza from  Nasopharyngeal swab specimens and should not be used as a sole basis for treatment. Nasal washings and aspirates are unacceptable for Xpert Xpress SARS-CoV-2/FLU/RSV testing.  Fact Sheet for Patients: EntrepreneurPulse.com.au  Fact Sheet for Healthcare Providers: IncredibleEmployment.be  This test is not yet approved or cleared by the [place] FDA and has been authorized for detection and/or diagnosis of SARS-CoV-2 by FDA under an Emergency Use Authorization (EUA). This EUA will remain in effect (meaning this test can be used) for the duration of the COVID-19 declaration under Section 564(b)(1) of the Act, 21 U.S.C. section 360bbb-3(b)(1), unless the authorization is terminated  or revoked.  Performed at [facility], [place] [address] [place], [place] [address]   Blood culture (routine x 2)     Status: None (Preliminary result)   Collection Time: 06/18/21  3:53 AM   Specimen: BLOOD RIGHT HAND  Result Value Ref Range Status   Specimen Description BLOOD RIGHT HAND  Final   Special Requests   Final    BOTTLES DRAWN AEROBIC AND ANAEROBIC Blood Culture adequate volume   Culture   Final    NO GROWTH < 12 HOURS Performed at [facility], [place] [address], [place], [place] [address]    Report Status PENDING  Incomplete     Labs: BNP (last 3 results) No results for input(s): BNP in the last 8760 hours. Basic Metabolic Panel: Recent Labs  Lab 06/17/21 1536 06/17/21 1648  NA 139 141  K 3.8 4.0  CL 104  --   CO2 28  --   GLUCOSE 138*  --   BUN 13  --   CREATININE 0.79  --   CALCIUM 9.3  --    Liver Function Tests: No results for input(s): AST, ALT, ALKPHOS, BILITOT, PROT, ALBUMIN in the last 168 hours. No results for input(s): LIPASE, AMYLASE in the last 168 hours. No results for input(s): AMMONIA in the last 168 hours. CBC: Recent Labs  Lab 06/17/21 1536 06/17/21 1648  WBC 10.1  --   HGB 12.2 12.2  HCT 37.4 36.0  MCV 95.9  --   PLT 216  --    Cardiac Enzymes: No results for input(s): CKTOTAL, CKMB, CKMBINDEX, TROPONINI in the last 168 hours. BNP: Invalid input(s): POCBNP CBG: Recent Labs  Lab 06/17/21 1712 06/17/21 1715 06/17/21 1718  GLUCAP 53* 47* 106*   D-Dimer No results for input(s): DDIMER in the last 72 hours. Hgb A1c No results for input(s): HGBA1C in the last 72 hours. Lipid Profile No results for input(s): CHOL, HDL, LDLCALC, TRIG, CHOLHDL, LDLDIRECT in the last 72 hours. Thyroid function studies No results for input(s): TSH, T4TOTAL, T3FREE, THYROIDAB in the last 72 hours.  Invalid input(s): FREET3 Anemia work up No results for input(s): VITAMINB12, FOLATE, FERRITIN, TIBC, IRON, RETICCTPCT in the last 72  hours. Urinalysis    Component Value Date/Time   COLORURINE YELLOW 06/17/2021 1524   APPEARANCEUR CLEAR 06/17/2021 1524   APPEARANCEUR Clear 11/11/2019 1559   LABSPEC 1.008 06/17/2021 1524   PHURINE 6.0 06/17/2021 1524   GLUCOSEU NEGATIVE 06/17/2021 1524   HGBUR NEGATIVE 06/17/2021 [place] 06/17/2021 1524   BILIRUBINUR Negative 11/11/2019 [place] 06/17/2021 1524   PROTEINUR NEGATIVE 06/17/2021 1524   NITRITE NEGATIVE 06/17/2021 1524   LEUKOCYTESUR MODERATE (A) 06/17/2021 1524   Sepsis Labs Invalid input(s): PROCALCITONIN,  WBC,  LACTICIDVEN Microbiology Recent Results (from the past 240 hour(s))  Blood culture (routine x 2)  Status: None (Preliminary result)   Collection Time: 06/18/21  3:17 AM   Specimen: BLOOD RIGHT FOREARM  Result Value Ref Range Status   Specimen Description BLOOD RIGHT FOREARM  Final   Special Requests   Final    BOTTLES DRAWN AEROBIC AND ANAEROBIC Blood Culture adequate volume   Culture   Final    NO GROWTH < 12 HOURS Performed at [facility], [address] [place], [place] [address]    Report Status PENDING  Incomplete  Resp Panel by RT-PCR (Flu A&B, Covid)     Status: None   Collection Time: 06/18/21  3:19 AM   Specimen: Nasopharyngeal(NP) swabs in vial transport medium  Result Value Ref Range Status   SARS Coronavirus 2 by RT PCR NEGATIVE NEGATIVE Final    Comment: (NOTE) SARS-CoV-2 target nucleic acids are NOT DETECTED.  The SARS-CoV-2 RNA is generally detectable in upper respiratory specimens during the acute phase of infection. The lowest concentration of SARS-CoV-2 viral copies this assay can detect is 138 copies/mL. A negative result does not preclude SARS-Cov-2 infection and should not be used as the sole basis for treatment or other patient management decisions. A negative result may occur with  improper specimen collection/handling, submission of specimen other than nasopharyngeal  swab, presence of viral mutation(s) within the areas targeted by this assay, and inadequate number of viral copies(<138 copies/mL). A negative result must be combined with clinical observations, patient history, and epidemiological information. The expected result is Negative.  Fact Sheet for Patients:  EntrepreneurPulse.com.au  Fact Sheet for Healthcare Providers:  IncredibleEmployment.be  This test is no t yet approved or cleared by the [place] FDA and  has been authorized for detection and/or diagnosis of SARS-CoV-2 by FDA under an Emergency Use Authorization (EUA). This EUA will remain  in effect (meaning this test can be used) for the duration of the COVID-19 declaration under Section 564(b)(1) of the Act, 21 U.S.C.section 360bbb-3(b)(1), unless the authorization is terminated  or revoked sooner.       Influenza A by PCR NEGATIVE NEGATIVE Final   Influenza B by PCR NEGATIVE NEGATIVE Final    Comment: (NOTE) The Xpert Xpress SARS-CoV-2/FLU/RSV plus assay is intended as an aid in the diagnosis of influenza from Nasopharyngeal swab specimens and should not be used as a sole basis for treatment. Nasal washings and aspirates are unacceptable for Xpert Xpress SARS-CoV-2/FLU/RSV testing.  Fact Sheet for Patients: EntrepreneurPulse.com.au  Fact Sheet for Healthcare Providers: IncredibleEmployment.be  This test is not yet approved or cleared by the [place] FDA and has been authorized for detection and/or diagnosis of SARS-CoV-2 by FDA under an Emergency Use Authorization (EUA). This EUA will remain in effect (meaning this test can be used) for the duration of the COVID-19 declaration under Section 564(b)(1) of the Act, 21 U.S.C. section 360bbb-3(b)(1), unless the authorization is terminated or revoked.  Performed at [facility], [place] [address] [place], [place] [address]   Blood  culture (routine x 2)     Status: None (Preliminary result)   Collection Time: 06/18/21  3:53 AM   Specimen: BLOOD RIGHT HAND  Result Value Ref Range Status   Specimen Description BLOOD RIGHT HAND  Final   Special Requests   Final    BOTTLES DRAWN AEROBIC AND ANAEROBIC Blood Culture adequate volume   Culture   Final    NO GROWTH < 12 HOURS Performed at [facility], [place] [address], [place], [place] [address]  Report Status PENDING  Incomplete       SIGNED:   Debbe Odea, MD  [facility] 06/18/2021, 3:52 PM

## 2021-06-18 NOTE — Progress Notes (Signed)
Modified Barium Swallow Progress Note  Patient Details  Name: Sharon Odom MRN: [id number] Date of Birth: 1971-05-18  Today's Date: 06/18/2021  Modified Barium Swallow completed.  Full report located under Chart Review in the Imaging Section.  Brief recommendations include the following:  Clinical Impression  Pt presents with a mild oropharyngeal dysphagia c/b effortful lingual manipulation of bolus and delayed AP transit, delayed swallow initiation, and diminished sensation.  These deficits resulted in silent aspiration of thin liquid before and during the swallow.  Cup presentation with increased aspiration over straw presentation as it placed neck in extended position v neutral positioning with straw.  Pt uses oral bolus hold, which was not beneficial to prevent penetration and aspiration.  A cued cough was partially beneficial, but did not clear aspiration.  There was no penetration or aspiration with nectar thick liquid by cup. There was no penetration or aspiration with solid or puree consistencies.  Pt demonstrated no oral or pharyngeal stasis of barium tablet during pill simulation given with puree as she is accustomed to at home.  There was trace to no pharyngeal residue.    There was slight retention of contrast in cervical esophagus (see image below).  Recommend regular texture diet and nectar thick liquid.  Pt needs follow up with her outpatient SLP to address swallow deficits and continue ongoing speech therapy.  Pt has used nectar thick liquid in the past, but it has not been successful for her.   Pt may benefit from repeat MBSS following any interventions to determine if pt can advance liquid consistency.  Cervical esophageal retention of contrast:    Swallow Evaluation Recommendations       SLP Diet Recommendations: Regular solids;Nectar thick liquid   Liquid Administration via: Cup   Medication Administration: Whole meds with puree   Supervision: Patient able to self  feed   Compensations: Slow rate;Small sips/bites   Postural Changes: Seated upright at 90 degrees   Oral Care Recommendations: Oral care BID   Other Recommendations: Order thickener from [facility] , [place], [place] Office: [phone]; Pager (1/22): [phone] 06/18/2021,11:34 AM

## 2021-06-18 NOTE — H&P (Signed)
History and Physical    Zayleigh Stroh AVW:[id number] DOB: 1970/06/06 DOA: 06/17/2021  PCP: Annita Brod, MD   Patient coming from: Home  I have personally briefly reviewed patient's old medical records in [facility]  CC: muscle spasms HPI: 51 year old female with a history of multiple sclerosis, status post intrathecal baclofen pump removal due to infection who presents to the ER today with continued spasms of the left side.  Patient was discharged from the hospital mid January 2022 for UTI.  She is followed up with her physical rehab doctor for her spasms.  She has not yet followed up with her neurologist.  She remains on Zanaflex 8 mg 3 times daily baclofen 40 mg 3 times daily and Valium 7.5 mg 3 times daily for her spasticity.  Patient states that she continues to have spasms on her left side that her Zanaflex, baclofen and Valium do not help.  She came to the ER for evaluation.  Patient's had a cough.  She does smoke.  She has had known dysphagia.  She has an outpatient speech therapist.  She does not recall any single event where she overtly choked.  Patient denies any fever or chills.  While in the ER, she was noted to have low blood pressures as low as 88/50.  She has been given 1 L of IV fluids with minimal response.  Patient denies any recent systemic steroids.  MRI of the thoracic and cervical spine today showed multifocal signal abnormalities in the cervical spine consistent with multiple sclerosis.  No new lesions were noted.  White count normal at 10.1.  Due to the patient's transient hypotension, [facility] hospitalist contacted for admission.  Procalcitonin, lactic acid and a noncontrasted chest CT was requested.   ED Course: transient hypotension in ER. Given 2 L IVF. No signs of sepsis.   Review of Systems:  Review of Systems  Constitutional:  Negative for chills, fever and weight loss.  HENT: Negative.    Eyes: Negative.   Respiratory:  Positive for cough.  Negative for shortness of breath.   Cardiovascular: Negative.  Negative for chest pain and palpitations.  Gastrointestinal:  Negative for abdominal pain, heartburn, nausea and vomiting.  Genitourinary: Negative.  Negative for dysuria, frequency and urgency.  Musculoskeletal:        Chronic left sided muscle spasms since baclofen pump was removed.  Skin: Negative.   Neurological:        Increase in left sided muscle spasms  Endo/Heme/Allergies: Negative.   Psychiatric/Behavioral: Negative.    All other systems reviewed and are negative.  Past Medical History:  Diagnosis Date   Allergies    Anxiety    Arthritis    Asthma    seasonal - pollen   Constipation    Depression    Dyspnea    with exertion   GERD (gastroesophageal reflux disease)    Headache    History of hiatal hernia    Left radial nerve palsy 03/15/2020   resolved - Left arm/hand weak   Multiple sclerosis (HCC)    Pneumonia 2007   x 1   Vision abnormalities    Wears glasses     Past Surgical History:  Procedure Laterality Date   EXCISION MORTON'S NEUROMA Right    fallopian tube removal     INTRATHECAL PUMP IMPLANT Right 02/18/2019   Procedure: INTRATHECAL PUMP IMPLANT;  Surgeon: Odette Fraction, MD;  Location[facility] OR;  Service: Neurosurgery;  Laterality: Right;  INTRATHECAL PUMP IMPLANT   INTRATHECAL  PUMP IMPLANT Right 02/17/2021   Procedure: Baclofen Pump Replacement;  Surgeon: Maeola Harman, MD;  Location[facility] OR;  Service: Neurosurgery;  Laterality: Right;  3C/RM 21   PAIN PUMP REMOVAL Left 04/06/2021   Procedure: Removal of baclofen pump, Left;  Surgeon: Dawley, Alan Mulder, DO;  Location: [facility] OR;  Service: Neurosurgery;  Laterality: Left;  Lateral/Left//3C rm 19   UPPER GI ENDOSCOPY     WISDOM TOOTH EXTRACTION     WISDOM TOOTH EXTRACTION       reports that she has been smoking cigarettes. She started smoking about 35 years ago. She has a 16.00 pack-year smoking history. She has never used smokeless tobacco. She  reports that she does not currently use alcohol. She reports that she does not use drugs.  Allergies  Allergen Reactions   Ziconotide Acetate Shortness Of Breath    Anorexia, weird sensations, could not talk, choking feeling, lost since of taste and smell. Hallucinations, vivid dreams. Confusion and sleepiness. N/v, increase in pain.  Anorexia, weird sensations, could not talk, choking feeling, lost since of taste and smell. Hallucinations, vivid dreams. Confusion and sleepiness. N/v, increase in pain.    Morphine Other (See Comments)    Pt does not recall reaction    Amoxicillin Nausea And Vomiting   Morphine Sulfate Other (See Comments)    Unknown   Pregabalin Nausea And Vomiting    Family History  Problem Relation Age of Onset   Diabetes Mother    Healthy Brother     Prior to Admission medications   Medication Sig Start Date End Date Taking? Authorizing Provider  acetaminophen (TYLENOL) 500 MG tablet Take 1,000 mg by mouth every 6 (six) hours as needed (pain).   Yes [provider]  albuterol (VENTOLIN HFA) 108 (90 Base) MCG/ACT inhaler Inhale 1-2 puffs into the lungs every 6 (six) hours as needed for wheezing or shortness of breath. 04/15/19  Yes Angiulli, Mcarthur Rossetti, PA-C  baclofen (LIORESAL) 20 MG tablet Take 2 tablets (40 mg total) by mouth 4 (four) times daily. Since ITB pump out Patient taking differently: Take 40 mg by mouth 3 (three) times daily. Since ITB pump out 04/17/21  Yes Lovorn, Aundra Millet, MD  buPROPion (WELLBUTRIN XL) 300 MG 24 hr tablet TAKE 1 TABLET DAILY Patient taking differently: Take 300 mg by mouth daily. 04/20/20  Yes Lovorn, Aundra Millet, MD  carboxymethylcellulose (REFRESH PLUS) 0.5 % SOLN Place 1 drop into both eyes 3 (three) times daily as needed (dry eyes).   Yes [provider]  Cholecalciferol (VITAMIN D-3) 125 MCG (5000 UT) TABS Take 5,000 Units by mouth daily. 04/15/19  Yes Angiulli, Mcarthur Rossetti, PA-C  Cyanocobalamin (VITAMIN B-12) 2500 MCG SUBL  Place 1 tablet (2,500 mcg total) under the tongue daily. 04/15/19  Yes Angiulli, Mcarthur Rossetti, PA-C  dalfampridine 10 MG TB12 Take 1 tablet (10 mg total) by mouth 2 (two) times daily. One po bid Patient taking differently: Take 10 mg by mouth 2 (two) times daily. 03/16/21  Yes Sater, [name], MD  diazepam (VALIUM) 5 MG tablet Take 1.5 tablets (7.5 mg total) by mouth every 8 (eight) hours. 06/16/21  Yes Lovorn, Aundra Millet, MD  montelukast (SINGULAIR) 10 MG tablet TAKE 1 TABLET DAILY Patient taking differently: Take 10 mg by mouth daily. 04/20/20  Yes Lovorn, Aundra Millet, MD  Oxycodone HCl 10 MG TABS Take 1 tablet (10 mg total) by mouth every 4 (four) hours as needed. Patient taking differently: Take 10 mg by mouth every 4 (four) hours as  needed (pain). 03/17/21  Yes Jones Bales, NP  oxymetazoline (AFRIN) 0.05 % nasal spray Place 1 spray into both nostrils 2 (two) times daily as needed for congestion.   Yes [provider]  polyethylene glycol (MIRALAX / GLYCOLAX) 17 g packet Take 17 g by mouth every other day.   Yes [provider]  raNITIdine HCl (ZANTAC PO) Take 10 mg by mouth daily.   Yes [provider]  senna-docusate (SENOKOT-S) 8.6-50 MG tablet Take 1 tablet by mouth daily. Patient taking differently: Take 2 tablets by mouth daily. 04/27/19  Yes Raulkar, Drema Pry, MD  tiZANidine (ZANAFLEX) 4 MG tablet Take 2 tablets (8 mg total) by mouth 3 (three) times daily. 04/17/21  Yes Lovorn, Aundra Millet, MD  trospium (SANCTURA) 20 MG tablet Take 20 mg by mouth 2 (two) times daily.   Yes [provider]  Wheat Dextrin (BENEFIBER) POWD Take 3.5 [place] by mouth 2 (two) times daily.   Yes [provider]  midodrine (PROAMATINE) 5 MG tablet Take 1 tablet (5 mg total) by mouth 3 (three) times daily with meals. Patient not taking: Reported on 06/16/2021 06/09/21 07/09/21  Burnadette Pop, MD    Physical Exam: Vitals:   06/18/21 0230 06/18/21 0300 06/18/21 0330 06/18/21 0400   BP: 93/61 (!) 98/56 103/65 101/73  Pulse: 65 73 72 77  Resp: Temp:      SpO2: 99% 98% 97% 98%  Weight:      Height:        Physical Exam Vitals and nursing note reviewed.  Constitutional:      General: She is not in acute distress.    Appearance: Normal appearance. She is not toxic-appearing or diaphoretic.     Comments: Chronically ill appearing  HENT:     Head: Normocephalic and atraumatic.     Nose: Nose normal.  Eyes:     General: No scleral icterus. Cardiovascular:     Rate and Rhythm: Normal rate and regular rhythm.     Pulses: Normal pulses.  Pulmonary:     Effort: Pulmonary effort is normal. No respiratory distress.     Breath sounds: Normal breath sounds. No wheezing or rales.  Abdominal:     General: Bowel sounds are normal. There is no distension.     Tenderness: There is no abdominal tenderness.     Hernia: No hernia is present.  Musculoskeletal:     Right lower leg: No edema.     Left lower leg: No edema.  Skin:    General: Skin is warm and dry.     Capillary Refill: Capillary refill takes less than 2 seconds.  Neurological:     Mental Status: She is alert and oriented to person, place, and time.     Comments: Nasal quality to her voice     Labs on Admission: I have personally reviewed following labs and imaging studies  CBC: Recent Labs  Lab 06/17/21 1536 06/17/21 1648  WBC 10.1  --   HGB 12.2 12.2  HCT 37.4 36.0  MCV 95.9  --   PLT 216  --    Basic Metabolic Panel: Recent Labs  Lab 06/17/21 1536 06/17/21 1648  NA 139 141  K 3.8 4.0  CL 104  --   CO2 28  --   GLUCOSE 138*  --   BUN 13  --   CREATININE 0.79  --   CALCIUM 9.3  --    GFR: Estimated Creatinine Clearance:  81.6 mL/min (by C-G formula based on SCr of 0.79 mg/dL). Liver Function Tests: No results for input(s): AST, ALT, ALKPHOS, BILITOT, PROT, ALBUMIN in the last 168 hours. No results for input(s): LIPASE, AMYLASE in the last 168 hours. No results for  input(s): AMMONIA in the last 168 hours. Coagulation Profile: No results for input(s): INR, PROTIME in the last 168 hours. Cardiac Enzymes: No results for input(s): CKTOTAL, CKMB, CKMBINDEX, TROPONINI in the last 168 hours. BNP (last 3 results) No results for input(s): PROBNP in the last 8760 hours. HbA1C: No results for input(s): HGBA1C in the last 72 hours. CBG: Recent Labs  Lab 06/17/21 1712 06/17/21 1715 06/17/21 1718  GLUCAP 53* 47* 106*   Lipid Profile: No results for input(s): CHOL, HDL, LDLCALC, TRIG, CHOLHDL, LDLDIRECT in the last 72 hours. Thyroid Function Tests: No results for input(s): TSH, T4TOTAL, FREET4, T3FREE, THYROIDAB in the last 72 hours. Anemia Panel: No results for input(s): VITAMINB12, FOLATE, FERRITIN, TIBC, IRON, RETICCTPCT in the last 72 hours. Urine analysis:    Component Value Date/Time   COLORURINE YELLOW 06/17/2021 1524   APPEARANCEUR CLEAR 06/17/2021 1524   APPEARANCEUR Clear 11/11/2019 1559   LABSPEC 1.008 06/17/2021 1524   PHURINE 6.0 06/17/2021 1524   GLUCOSEU NEGATIVE 06/17/2021 1524   HGBUR NEGATIVE 06/17/2021 1524   BILIRUBINUR NEGATIVE 06/17/2021 1524   BILIRUBINUR Negative 11/11/2019 1559   KETONESUR NEGATIVE 06/17/2021 1524   PROTEINUR NEGATIVE 06/17/2021 1524   NITRITE NEGATIVE 06/17/2021 1524   LEUKOCYTESUR MODERATE (A) 06/17/2021 1524    Radiological Exams on Admission: I have personally reviewed images MR Cervical Spine W or Wo Contrast  Result Date: 06/18/2021 CLINICAL DATA:  Initial evaluation for multiple sclerosis, worsening left-sided muscular spasms. EXAM: MRI CERVICAL SPINE WITHOUT AND WITH CONTRAST TECHNIQUE: Multiplanar and multiecho pulse sequences of the cervical spine, to include the craniocervical junction and cervicothoracic junction, were obtained without and with intravenous contrast. CONTRAST:  54mL GADAVIST GADOBUTROL 1 MMOL/ML IV SOLN COMPARISON:  Prior MRI from 02/02/2021. FINDINGS: Alignment: Straightening of  the normal cervical lordosis. No listhesis or interval malalignment. Vertebrae: Vertebral body height maintained without acute or chronic fracture. Bone marrow signal intensity within normal limits. Benign hemangioma noted within the C7 vertebral body. No worrisome osseous lesions. No abnormal marrow edema or enhancement. Cord: Patchy signal abnormality again seen involving the central aspect of the cord at the level of C2-3. The left dorsal cord at the level of C3-4. In the right dorsal cord at the level of C6. Findings consistent with demyelinating disease/multiple sclerosis, and are relatively stable as compared to previous MRI. No new lesions to suggest interval progression. No abnormal enhancement to suggest active demyelination. Normal cord caliber and morphology. Posterior Fossa, vertebral arteries, paraspinal tissues: Extensive multifocal signal abnormality noted involving the visualized brainstem and cervicomedullary junction, consistent with demyelinating disease. Craniocervical junction otherwise unremarkable. Paraspinous and prevertebral soft tissues within normal limits. Normal flow voids seen within the vertebral arteries bilaterally. Disc levels: C2-C3: Mild left-sided uncovertebral hypertrophy without significant disc bulge. Mild facet degeneration on the right. No canal or foraminal stenosis. C3-C4: Mild disc bulge with endplate and uncovertebral spurring. Flattening of the ventral thecal sac without significant spinal stenosis. Mild left C4 foraminal narrowing. Right neural foramina remains patent. C4-C5: Mild disc bulge with right greater than left uncovertebral hypertrophy. Flattening of the ventral thecal sac without significant spinal stenosis. Mild-to-moderate right C5 foraminal stenosis. Left neural foramina remains patent. C5-C6: Mild intervertebral disc space narrowing with diffuse disc bulge. Associated endplate  and uncovertebral spurring. Flattening and partial effacement of the ventral  thecal sac with resultant mild spinal stenosis. No significant cord deformity. Moderate bilateral C6 foraminal narrowing, slightly worse on the right. C6-C7: Mild intervertebral disc space narrowing with diffuse disc bulge and bilateral uncovertebral hypertrophy. No significant spinal stenosis. Moderate left with mild right C7 foraminal stenosis. C7-T1:  Unremarkable. IMPRESSION: 1. Multifocal signal abnormality involving the cervical spinal cord as above, consistent with history of demyelinating disease/multiple sclerosis. No new lesions to suggest disease progression. No evidence for active demyelination. 2. Mild-to-moderate multilevel cervical spondylosis, most pronounced at C5-6 where there is resultant mild spinal stenosis. Mild to moderate right C5, with moderate bilateral C6 and left C7 foraminal stenosis related to disc bulge and uncovertebral disease. Electronically Signed   By: Rise Mu M.D.   On: 06/18/2021 02:15   MR THORACIC SPINE W WO CONTRAST  Result Date: 06/18/2021 CLINICAL DATA:  Initial evaluation for multiple sclerosis, worsening left-sided muscular spasms. EXAM: MRI THORACIC WITHOUT AND WITH CONTRAST TECHNIQUE: Multiplanar and multiecho pulse sequences of the thoracic spine were obtained without and with intravenous contrast. CONTRAST:  68mL GADAVIST GADOBUTROL 1 MMOL/ML IV SOLN COMPARISON:  Prior MRI from 03/31/2019. FINDINGS: Alignment: Vertebral bodies normally aligned with preservation of the normal thoracic kyphosis. No listhesis. Vertebrae: Vertebral body height maintained without acute or chronic fracture. Bone marrow signal intensity diffusely heterogeneous with multiple scattered benign hemangiomata noted. Probable small atypical hemangiomas again noted within the T5 and T10 vertebral bodies, stable. No worrisome osseous lesions. No abnormal marrow edema or enhancement. Cord: Normal signal and morphology. No cord signal changes to suggest demyelinating disease within the  thoracic spinal cord. Normal cord caliber morphology. No abnormal enhancement. Paraspinal and other soft tissues: Paraspinous soft tissues within normal limits. Irregular parenchymal changes at the posterior left lung could reflect atelectasis or infiltrate. Visualized visceral structures otherwise unremarkable. Disc levels: No significant disc pathology seen within the thoracic spine. Discs are well hydrated with preserved disc space height. No disc bulge or focal disc herniation. No significant facet disease. No canal or neural foraminal stenosis or evidence for neural impingement. IMPRESSION: 1. Stable normal MRI of the thoracic spinal cord. No evidence for demyelinating disease. 2. No significant disc pathology, stenosis, or evidence for neural impingement. 3. Irregular opacity at the posterior left lung, which could reflect atelectasis or infiltrate. Correlation with physical exam and chest x-ray recommended as clinically warranted. Electronically Signed   By: Rise Mu M.D.   On: 06/18/2021 02:31   DG Chest Portable 1 View  Result Date: 06/18/2021 CLINICAL DATA:  Opacity seen on MRI concerning for pneumonia. EXAM: PORTABLE CHEST 1 VIEW COMPARISON:  06/08/2021. FINDINGS: The heart size and mediastinal contours are within normal limits. Mild atelectasis is present at the lung bases. No consolidation, effusion, or pneumothorax. No acute osseous abnormality. IMPRESSION: Mild atelectasis at the lung bases.  No acute abnormality. Electronically Signed   By: Thornell Sartorius M.D.   On: 06/18/2021 03:13    EKG: I have personally reviewed EKG: NSR   Assessment/Plan Principal Problem:   Transient hypotension Active Problems:   Spastic diplegia, acquired, lower extremity (HCC)   Multiple sclerosis (HCC)   Dysphagia    Transient hypotension Assigned to observation telemetry bed.  Unclear where her hypotension is coming from.  Patient was discharged with p.o. midodrine as she states she had  hypotension while she was in the hospital.  She has not taken this since she was discharged from the hospital.  Patient states that she stays well-hydrated at home.  Again she denies any recent systemic steroid use.  Not convinced the patient has pneumonia at this time.  Her chest x-ray does not show any infiltrates.  I have requested a noncontrasted chest CT, procalcitonin and lactic acid.  Hold off on antibiotics for now.  Monitor the patient's blood pressure.  Spastic diplegia, acquired, lower extremity (HCC) Chronic.  Multiple sclerosis (HCC) Patient currently not on any medications for her MS.  Neurology has been consulted to evaluate whether or not her continued spasticity in this recent bout of hypotension could be an MS flare.  Dysphagia Patient has known dysphagia.  If her chest CT shows infiltrates, she may have aspiration pneumonia.  She may benefit from seeing speech therapy for modified barium swallow and/or FEES.  DVT prophylaxis: Lovenox Code Status: Full Code Family Communication: no family at bedside  Disposition Plan: return home  Consults called: EDP has consulted neurology  Admission status: Observation, Telemetry bed   Carollee Herter, DO [facility] 06/18/2021, 4:20 AM

## 2021-06-18 NOTE — Assessment & Plan Note (Signed)
Patient currently not on any medications for her MS.  Neurology has been consulted to evaluate whether or not her continued spasticity in this recent bout of hypotension could be an MS flare.

## 2021-06-18 NOTE — Consult Note (Signed)
NAME:  Sharon Odom, MRN:  [id number], DOB:  15-Aug-1970, LOS: 0 ADMISSION DATE:  06/17/2021, CONSULTATION DATE:  06/18/21 REFERRING MD:  Wynelle Cleveland  CHIEF COMPLAINT:  06/18/21   History of Present Illness:  51yF with history of MS s/p IT baclofen pump removal due to infection, smoking ~15 py who was admitted today for worsening left side spasms despite adherence to her home regimen.   Has had cough for at least a year. It has become productive of whitish sputum over last month. She acknowledges issues with dysphagia, has had no fever, drenching night sweats, unintentional weight loss.   In ED here she was given 2L bolus and antispasmodic regimen. MBS here shows silent aspiration and she has been switched to nectar thick liquids.  We were asked to consult for mucus plugging LLL, subsegmental atelectasis vs pneumonia.  No family history of lung cancer.   Pertinent  Medical History  MS GERD Smoking  Significant Hospital Events: Including procedures, antibiotic start and stop dates in addition to other pertinent events     Interim History / Subjective:    Objective   Blood pressure 92/64, pulse 76, temperature 98 F (36.7 C), resp. rate 17, height 5\' 9"  (1.753 m), weight 60.8 kg, SpO2 99 %.        Intake/Output Summary (Last 24 hours) at 06/18/2021 1245 Last data filed at 06/18/2021 [id number] Gross per 24 hour  Intake 1000 ml  Output 200 ml  Net 800 ml   Filed Weights   06/17/21 1627  Weight: 60.8 kg    Examination: General appearance: 51 y.o., female, NAD, conversant  Eyes: anicteric sclerae; PERRL, tracking appropriately HENT: NCAT; MMM Neck: Trachea midline; no lymphadenopathy, no JVD Lungs: rhonchorous bl, no crackles, no wheeze, with normal respiratory effort CV: RRR, no murmur  Abdomen: Soft, non-tender; non-distended, BS present  Extremities: No peripheral edema, warm Skin: Normal turgor and texture; no rash Psych: Appropriate affect Neuro: Alert and oriented to person  and place, no focal deficit    WBCs 10.1  CT Chest reviewed by me with LLL mucus plugging and subsegmental atelectasis vs pneumonia  Resolved Hospital Problem list     Assessment & Plan:   # LLL mucus plugging # Subsegmental atelectasis vs pneumonia: - if discharges home would recommend following regimen  - 7d course doxycycline  - albuterol 2 puffs twice daily followed by flutter valve 10 slow but firm puffs twice daily  - if remains inpatient would do albuterol nebs QID while awake followed by flutter valve 10 slow but firm puffs QID while awake - given smoking history will repeat CT Chest in 4 weeks and will see her in clinic to discuss   Will sign off but glad to be reinvolved as condition changes  Best Practice (right click and "Reselect all SmartList Selections" daily)   Per [facility]  Labs   CBC: Recent Labs  Lab 06/17/21 1536 06/17/21 1648  WBC 10.1  --   HGB 12.2 12.2  HCT 37.4 36.0  MCV 95.9  --   PLT 216  --     Basic Metabolic Panel: Recent Labs  Lab 06/17/21 1536 06/17/21 1648  NA 139 141  K 3.8 4.0  CL 104  --   CO2 28  --   GLUCOSE 138*  --   BUN 13  --   CREATININE 0.79  --   CALCIUM 9.3  --    GFR: Estimated Creatinine Clearance: 81.6 mL/min (by C-G formula based on SCr of  0.79 mg/dL). Recent Labs  Lab 06/17/21 1536 06/18/21 0353 06/18/21 0355  PROCALCITON  --   --  <0.10  WBC 10.1  --   --   LATICACIDVEN  --  0.9  --     Liver Function Tests: No results for input(s): AST, ALT, ALKPHOS, BILITOT, PROT, ALBUMIN in the last 168 hours. No results for input(s): LIPASE, AMYLASE in the last 168 hours. No results for input(s): AMMONIA in the last 168 hours.  ABG    Component Value Date/Time   HCO3 30.7 (H) 06/17/2021 1648   TCO2 32 06/17/2021 1648   O2SAT 84.0 06/17/2021 1648     Coagulation Profile: No results for input(s): INR, PROTIME in the last 168 hours.  Cardiac Enzymes: No results for input(s): CKTOTAL, CKMB, CKMBINDEX,  TROPONINI in the last 168 hours.  HbA1C: No results found for: HGBA1C  CBG: Recent Labs  Lab 06/17/21 1712 06/17/21 1715 06/17/21 1718  GLUCAP 53* 47* 106*    Review of Systems:   12 point review of systems is negative except as in hpi  Past Medical History:  She,  has a past medical history of Allergies, Anxiety, Arthritis, Asthma, Constipation, Depression, Dyspnea, GERD (gastroesophageal reflux disease), Headache, History of hiatal hernia, Left radial nerve palsy (03/15/2020), Multiple sclerosis ([place]), Pneumonia (2007), Vision abnormalities, and Wears glasses.   Surgical History:   Past Surgical History:  Procedure Laterality Date   EXCISION MORTON'S NEUROMA Right    fallopian tube removal     INTRATHECAL PUMP IMPLANT Right 02/18/2019   Procedure: INTRATHECAL PUMP IMPLANT;  Surgeon: Clydell Hakim, MD;  Location: [facility];  Service: Neurosurgery;  Laterality: Right;  INTRATHECAL PUMP IMPLANT   INTRATHECAL PUMP IMPLANT Right 02/17/2021   Procedure: Baclofen Pump Replacement;  Surgeon: Erline Levine, MD;  Location: [facility];  Service: Neurosurgery;  Laterality: Right;  3C/RM 21   PAIN PUMP REMOVAL Left 04/06/2021   Procedure: Removal of baclofen pump, Left;  Surgeon: Dawley, Theodoro Doing, DO;  Location: [facility];  Service: Neurosurgery;  Laterality: Left;  Lateral/Left//3C rm 19   UPPER GI ENDOSCOPY     WISDOM TOOTH EXTRACTION     WISDOM TOOTH EXTRACTION       Social History:   reports that she has been smoking cigarettes. She started smoking about 35 years ago. She has a 16.00 pack-year smoking history. She has never used smokeless tobacco. She reports that she does not currently use alcohol. She reports that she does not use drugs.   Family History:  Her family history includes Diabetes in her mother; Healthy in her brother.   Allergies Allergies  Allergen Reactions   Ziconotide Acetate Shortness Of Breath    Anorexia, weird sensations, could not talk, choking feeling, lost since of  taste and smell. Hallucinations, vivid dreams. Confusion and sleepiness. N/v, increase in pain.  Anorexia, weird sensations, could not talk, choking feeling, lost since of taste and smell. Hallucinations, vivid dreams. Confusion and sleepiness. N/v, increase in pain.    Morphine Other (See Comments)    Pt does not recall reaction    Amoxicillin Nausea And Vomiting   Morphine Sulfate Other (See Comments)    Unknown   Pregabalin Nausea And Vomiting     Home Medications  Prior to Admission medications   Medication Sig Start Date End Date Taking? Authorizing Provider  acetaminophen (TYLENOL) 500 MG tablet Take 1,000 mg by mouth every 6 (six) hours as needed (pain).   Yes [provider]  albuterol (VENTOLIN HFA)  108 (90 Base) MCG/ACT inhaler Inhale 1-2 puffs into the lungs every 6 (six) hours as needed for wheezing or shortness of breath. 04/15/19  Yes Angiulli, Lavon Paganini, PA-C  baclofen (LIORESAL) 20 MG tablet Take 2 tablets (40 mg total) by mouth 4 (four) times daily. Since ITB pump out Patient taking differently: Take 40 mg by mouth 3 (three) times daily. Since ITB pump out 04/17/21  Yes Lovorn, Jinny Blossom, MD  buPROPion (WELLBUTRIN XL) 300 MG 24 hr tablet TAKE 1 TABLET DAILY Patient taking differently: Take 300 mg by mouth daily. 04/20/20  Yes Lovorn, Jinny Blossom, MD  carboxymethylcellulose (REFRESH PLUS) 0.5 % SOLN Place 1 drop into both eyes 3 (three) times daily as needed (dry eyes).   Yes [provider]  Cholecalciferol (VITAMIN D-3) 125 MCG (5000 UT) TABS Take 5,000 Units by mouth daily. 04/15/19  Yes Angiulli, Lavon Paganini, PA-C  Cyanocobalamin (VITAMIN B-12) 2500 MCG SUBL Place 1 tablet (2,500 mcg total) under the tongue daily. 04/15/19  Yes Angiulli, Lavon Paganini, PA-C  dalfampridine 10 MG TB12 Take 1 tablet (10 mg total) by mouth 2 (two) times daily. One po bid Patient taking differently: Take 10 mg by mouth 2 (two) times daily. 03/16/21  Yes Sater, Nanine Means, MD  diazepam (VALIUM) 5  MG tablet Take 1.5 tablets (7.5 mg total) by mouth every 8 (eight) hours. 06/16/21  Yes Lovorn, Jinny Blossom, MD  montelukast (SINGULAIR) 10 MG tablet TAKE 1 TABLET DAILY Patient taking differently: Take 10 mg by mouth daily. 04/20/20  Yes Lovorn, Jinny Blossom, MD  Oxycodone HCl 10 MG TABS Take 1 tablet (10 mg total) by mouth every 4 (four) hours as needed. Patient taking differently: Take 10 mg by mouth every 4 (four) hours as needed (pain). 03/17/21  Yes Bayard Hugger, NP  oxymetazoline (AFRIN) 0.05 % nasal spray Place 1 spray into both nostrils 2 (two) times daily as needed for congestion.   Yes [provider]  polyethylene glycol (MIRALAX / GLYCOLAX) 17 g packet Take 17 g by mouth every other day.   Yes [provider]  raNITIdine HCl (ZANTAC PO) Take 10 mg by mouth daily.   Yes [provider]  senna-docusate (SENOKOT-S) 8.6-50 MG tablet Take 1 tablet by mouth daily. Patient taking differently: Take 2 tablets by mouth daily. 04/27/19  Yes Raulkar, Clide Deutscher, MD  tiZANidine (ZANAFLEX) 4 MG tablet Take 2 tablets (8 mg total) by mouth 3 (three) times daily. 04/17/21  Yes Lovorn, Jinny Blossom, MD  trospium (SANCTURA) 20 MG tablet Take 20 mg by mouth 2 (two) times daily.   Yes [provider]  Wheat Dextrin (BENEFIBER) POWD Take 3.5 Scoops by mouth 2 (two) times daily.   Yes [provider]  midodrine (PROAMATINE) 5 MG tablet Take 1 tablet (5 mg total) by mouth 3 (three) times daily with meals. Patient not taking: Reported on 06/16/2021 06/09/21 07/09/21  Shelly Coss, MD

## 2021-06-18 NOTE — ED Provider Notes (Signed)
EKG Interpretation  Date/Time:  Sunday June 18 2021 02:34:19 EST Ventricular Rate:  70 PR Interval:  201 QRS Duration: 77 QT Interval:  434 QTC Calculation: 469 R Axis:   8 Text Interpretation: Sinus rhythm Borderline low voltage, extremity leads Confirmed by Ripley Fraise [phone]) on 06/18/2021 2:43:39 AM          Ripley Fraise, MD 06/18/21 [phone]

## 2021-06-18 NOTE — Telephone Encounter (Signed)
Can we set up pulmonary clinic follow up for her in about 5-6 weeks? Ideally on same day as PFT?

## 2021-06-18 NOTE — Subjective & Objective (Signed)
CC: muscle spasms HPI: 51 year old female with a history of multiple sclerosis, status post intrathecal baclofen pump removal due to infection who presents to the ER today with continued spasms of the left side.  Patient was discharged from the hospital mid January 2022 for UTI.  She is followed up with her physical rehab doctor for her spasms.  She has not yet followed up with her neurologist.  She remains on Zanaflex 8 mg 3 times daily baclofen 40 mg 3 times daily and Valium 7.5 mg 3 times daily for her spasticity.  Patient states that she continues to have spasms on her left side that her Zanaflex, baclofen and Valium do not help.  She came to the ER for evaluation.  Patient's had a cough.  She does smoke.  She has had known dysphagia.  She has an outpatient speech therapist.  She does not recall any single event where she overtly choked.  Patient denies any fever or chills.  While in the ER, she was noted to have low blood pressures as low as 88/50.  She has been given 1 L of IV fluids with minimal response.  Patient denies any recent systemic steroids.  MRI of the thoracic and cervical spine today showed multifocal signal abnormalities in the cervical spine consistent with multiple sclerosis.  No new lesions were noted.  White count normal at 10.1.  Due to the patient's transient hypotension, [facility] hospitalist contacted for admission.  Procalcitonin, lactic acid and a noncontrasted chest CT was requested.

## 2021-06-18 NOTE — Assessment & Plan Note (Signed)
Patient has known dysphagia.  If her chest CT shows infiltrates, she may have aspiration pneumonia.  She may benefit from seeing speech therapy for modified barium swallow and/or FEES.

## 2021-06-18 NOTE — Discharge Instructions (Signed)
The pulmonologist who saw you in the hospital recommends > albuterol 2 puffs twice daily followed by flutter valve 10 slow but firm puffs twice daily

## 2021-06-18 NOTE — Progress Notes (Signed)
Speech Language Pathology Treatment: Dysphagia  Patient Details Name: Sharon Odom MRN: [id number] DOB: 08-23-1970 Today's Date: 06/18/2021 Time: [id number] SLP Time Calculation (min) (ACUTE ONLY): 21 min  Assessment / Plan / Recommendation Clinical Impression  Pt seen in room for thickening education.  Pt reports planned discharge today.  Pt has used Thick-It before but did not like it.  Provided Simply Thick starter kits x2.  Reviewed nectar thick guidelines.  Demonstrated thickening larger quantity at pt request with bedside water pitcher.  Reviewed items to avoid which melt to thin consistency and provided education on thick ice cubes.  Pt indicated that she could use written reminders.  There are instructions for commercial thickener use with starter kits, but SLP provided written instructions for thickening as well.  Pt was moved from ED to 110M when SLP retrieved additional thickener packets.  Dropped off all samples and education in room.  SLP will follow in house to reinforce thickening education, initiate swallow therapy, and reassess as indicated.   HPI HPI: Sharon Odom is a 51 y.o. female with a history of multiple sclerosis who presents with left-sided spasms.  She takes baclofen and Zanaflex as well as diazepam.  Over the past week, she has had increasing spasms of her left side. CXR 1/22: "Segmental occlusion of the left lower lobe bronchus. This could reflect airway infection or aspiration, but endobronchial mass is  difficult to exclude. And follow-up bronchoscopy should be considered if the left lung abnormality fails to resolve (see #2).    2. Associated confluent left lower lobe opacity is probably a combination of pneumonia and atelectasis. No pleural effusion.  Superimposed atelectasis elsewhere."      SLP Plan  Continue with current plan of care      Recommendations for follow up therapy are one component of a multi-disciplinary discharge planning process, led by the attending  physician.  Recommendations may be updated based on patient status, additional functional criteria and insurance authorization.    Recommendations  Diet recommendations: Nectar-thick liquid Liquids provided via: Cup Medication Administration: Whole meds with puree Supervision: Patient able to self feed Compensations: Slow rate;Small sips/bites Postural Changes and/or Swallow Maneuvers: Seated upright 90 degrees                Oral Care Recommendations: Oral care BID Follow Up Recommendations: Outpatient SLP Assistance recommended at discharge: None SLP Visit Diagnosis: Dysphagia, oropharyngeal phase (R13.12) Plan: Continue with current plan of care           Celedonio Savage, [place], [place] Office: [phone]; Pager (1/22): [phone] 06/18/2021, 2:16 PM

## 2021-06-18 NOTE — Assessment & Plan Note (Signed)
Assigned to observation telemetry bed.  Unclear where her hypotension is coming from.  Patient was discharged with p.o. midodrine as she states she had hypotension while she was in the hospital.  She has not taken this since she was discharged from the hospital.  Patient states that she stays well-hydrated at home.  Again she denies any recent systemic steroid use.  Not convinced the patient has pneumonia at this time.  Her chest x-ray does not show any infiltrates.  I have requested a noncontrasted chest CT, procalcitonin and lactic acid.  Hold off on antibiotics for now.  Monitor the patient's blood pressure.

## 2021-06-18 NOTE — ED Provider Notes (Signed)
Received from Margarita Mail, PA-C provided HPI, current or work-up, likely disposition please see her note for full detail  Patient with a history of MS presenting with worsening weakness concerning for possible MS flareup.  Per previous provider follow-up on MRI of thoracic and cervical spine if positive for MS flareup consult with neurology and admit, if negative can follow-up with her patient follow-up with her physiatrist.  Lab work reveals CBC which is unremarkable, BMP shows glucose of 138, UA shows moderate leukocytes, white blood cells rare bacteria  Imaging-MRI of C-spine reveals abnormalities consistent with MS, no new lesions or signs of progression, multilevel cervical spondylosis.,  MRI of thoracic spine stable MRI, does show opacities seen at the left posterior lung.  Chest x-ray unremarkable  EKG sinus without signs of ischemia.  Reassessment-noted that patient's BP has been soft her entire visit but has decreased into the mid 73s, personally went and assessed the patient she was found resting comfortably, noted that she was given a milligram of Ativan prior to her MR scan likely this is the cause of her drop.   will provide her with a liter of fluids and continue to monitor.   Patient  reassessed continues to be hypotensive, despite fluid resuscitation, imaging of MRI thoracic spine showed possible pneumonia lung sounds were assessed she did have some rhonchi noted in the left lower lobes patient states she has a history of choking on her saliva I am concerned for possible aspirate pneumonia will obtain chest x-ray.  Chest ray is unremarkable, UA was slightly abnormal but she is not endorse any urinary symptoms, continue to be hypotensive without a clear source,  will add on procalcitonin, CT chest without contrast,  lactic recommend admission, patient in agreement with this plan will consult hospitalist team.   Consultation-spoke with Dr. Leonel Ramsay about patient's MRI readings,  does not feel these show active disease this time, would recommend MRI of brain he will come down and see the patient.  Spoke with Dr. Bridgett Larsson who will admit the patient.    Marcello Fennel, PA-C 06/18/21 0416    Merrily Pew, MD 06/18/21 [phone]

## 2021-06-18 NOTE — Progress Notes (Signed)
DISCHARGE NOTE HOME Sharon Odom to be discharged Home per MD order. Discussed prescriptions and follow up appointments with the patient. Prescriptions given to patient; medication list explained in detail. Patient verbalized understanding.  Skin clean, dry and intact without evidence of skin break down, no evidence of skin tears noted. IV catheter discontinued intact. Site without signs and symptoms of complications. Dressing and pressure applied. Pt denies pain at the site currently. No complaints noted.  Patient free of lines, drains, and wounds.   An After Visit Summary (AVS) was printed and given to the patient. Patient escorted via wheelchair, and discharged home via private auto.  Myrtis Hopping, RN

## 2021-06-18 NOTE — Evaluation (Signed)
Clinical/Bedside Swallow Evaluation Patient Details  Name: Sharon Odom MRN: [id number] Date of Birth: [birth year]  Today's Date: 06/18/2021 Time: SLP Start Time (ACUTE ONLY): 1009 SLP Stop Time (ACUTE ONLY): 1022 SLP Time Calculation (min) (ACUTE ONLY): 13 min  Past Medical History:  Past Medical History:  Diagnosis Date   Allergies    Anxiety    Arthritis    Asthma    seasonal - pollen   Constipation    Depression    Dyspnea    with exertion   GERD (gastroesophageal reflux disease)    Headache    History of hiatal hernia    Left radial nerve palsy 03/15/2020   resolved - Left arm/hand weak   Multiple sclerosis (HCC)    Pneumonia 2007   x 1   Vision abnormalities    Wears glasses    Past Surgical History:  Past Surgical History:  Procedure Laterality Date   EXCISION MORTON'S NEUROMA Right    fallopian tube removal     INTRATHECAL PUMP IMPLANT Right 02/18/2019   Procedure: INTRATHECAL PUMP IMPLANT;  Surgeon: Odette Fraction, MD;  Location: [facility] OR;  Service: Neurosurgery;  Laterality: Right;  INTRATHECAL PUMP IMPLANT   INTRATHECAL PUMP IMPLANT Right 02/17/2021   Procedure: Baclofen Pump Replacement;  Surgeon: Maeola Harman, MD;  Location: [facility] OR;  Service: Neurosurgery;  Laterality: Right;  3C/RM 21   PAIN PUMP REMOVAL Left 04/06/2021   Procedure: Removal of baclofen pump, Left;  Surgeon: Dawley, Alan Mulder, DO;  Location: [facility] OR;  Service: Neurosurgery;  Laterality: Left;  Lateral/Left//3C rm 19   UPPER GI ENDOSCOPY     WISDOM TOOTH EXTRACTION     WISDOM TOOTH EXTRACTION     HPI:  Sharon Odom is a 51 y.o. female with a history of multiple sclerosis who presents with left-sided spasms.  She takes baclofen and Zanaflex as well as diazepam.  Over the past week, she has had increasing spasms of her left side. CXR 1/22: "Segmental occlusion of the left lower lobe bronchus. This could reflect airway infection or aspiration, but endobronchial mass is  difficult to exclude. And follow-up  bronchoscopy should be considered if the left lung abnormality fails to resolve (see #2).    2. Associated confluent left lower lobe opacity is probably a combination of pneumonia and atelectasis. No pleural effusion.  Superimposed atelectasis elsewhere."    Assessment / Plan / Recommendation  Clinical Impression  Pt presents with hx of dysphagia in setting of MS.  She has been seen by OP ST and reports her most recent instrumental study was in 2016 in [place]. She was most recently seen by this service in November for clincial management of dysphagia. Pt reports difficulty swallowing and managing her secretions at times.  She states she has to chew for a long time.  Vocal quality is harsh, hoarse, with low intensity and pitch.  She reports her voice has always been low frequency, but other qualities have changed.  Today pt tolerated all consistencies trials without any clinical s/s of aspiration; however, a concern for prandial aspiration still exists given hx of MS with dysphagia and current chest imaging with possible pneumonia. Will proceed to [facility] for further assessment of swallow function.  Recommend continuing regular texture diet with thin liquids pending instrumental assessment.   SLP Visit Diagnosis: Dysphagia, unspecified (R13.10)    Aspiration Risk  Mild aspiration risk    Diet Recommendation  (pending MBS)        Other  Recommendations  Recommendations for follow up therapy are one component of a multi-disciplinary discharge planning process, led by the attending physician.  Recommendations may be updated based on patient status, additional functional criteria and insurance authorization.  Follow up Recommendations  (pending MBS)      Assistance Recommended at Discharge  (pending MBS)  Functional Status Assessment  (pending MBS)  Frequency and Duration  (pending MBS)          Prognosis Prognosis for Safe Diet Advancement:  (pending MBS)      Swallow Study   General  Date of Onset: 06/17/21 HPI: [name] is a 51 y.o. female with a history of multiple sclerosis who presents with left-sided spasms.  She takes baclofen and Zanaflex as well as diazepam.  Over the past week, she has had increasing spasms of her left side. CXR 1/22: "Segmental occlusion of the left lower lobe bronchus. This could reflect airway infection or aspiration, but endobronchial mass is  difficult to exclude. And follow-up bronchoscopy should be considered if the left lung abnormality fails to resolve (see #2).    2. Associated confluent left lower lobe opacity is probably a combination of pneumonia and atelectasis. No pleural effusion.  Superimposed atelectasis elsewhere." Type of Study: Bedside Swallow Evaluation Previous Swallow Assessment: hx OP dysphagia management.  She reports most recent instrumental in 2016 Diet Prior to this Study: Regular;Thin liquids Temperature Spikes Noted: No History of Recent Intubation: No Behavior/Cognition: Alert;Cooperative;Pleasant mood Oral Cavity Assessment: Within Functional Limits Oral Care Completed by SLP: No Oral Cavity - Dentition: Adequate natural dentition Vision: Functional for self-feeding Self-Feeding Abilities: Able to feed self Patient Positioning: Upright in bed Baseline Vocal Quality: Low vocal intensity;Hoarse (Harsh, Low pitch) Volitional Cough: Strong Volitional Swallow: Unable to elicit    Oral/Motor/Sensory Function Overall Oral Motor/Sensory Function: Moderate impairment Facial ROM: Reduced left Facial Symmetry: Abnormal symmetry left Lingual ROM: Reduced right;Reduced left Lingual Symmetry: Within Functional Limits Lingual Strength: Reduced Velum: Within Functional Limits Mandible: Within Functional Limits   Ice Chips Ice chips: Not tested   Thin Liquid Thin Liquid: Within functional limits Presentation: Straw;Cup    Nectar Thick Nectar Thick Liquid: Not tested   Honey Thick Honey Thick Liquid: Not tested   Puree  Puree: Within functional limits Presentation: Spoon   Solid     Solid: Within functional limits Presentation: [place], [place], [place] Office: [phone] 06/18/2021,10:37 AM

## 2021-06-19 LAB — URINE CULTURE

## 2021-06-19 NOTE — Telephone Encounter (Signed)
I have called to make a follow up for this patient around 08/15/2021 for PFT and OV. Thanks

## 2021-06-20 NOTE — Telephone Encounter (Signed)
Patient is scheduled on 08/15/2021 for PFT and OV. Nothing further needed.

## 2021-06-21 NOTE — Telephone Encounter (Signed)
I called [employer] spoke with Ruth(Ref#[id number]) she informed me that a PA is needed for Botox. She directed me the website where I printed off a PA form. Completed the form and faxed to [facility].

## 2021-06-22 NOTE — Telephone Encounter (Signed)
Received approval from [facility]. Reference #[id number] (06/21/2021-06/21/2022).

## 2021-06-23 ENCOUNTER — Encounter: Payer: Self-pay | Admitting: Physical Medicine and Rehabilitation

## 2021-06-23 ENCOUNTER — Telehealth: Payer: Self-pay

## 2021-06-23 LAB — CULTURE, BLOOD (ROUTINE X 2)
Culture: NO GROWTH
Culture: NO GROWTH
Special Requests: ADEQUATE
Special Requests: ADEQUATE

## 2021-06-23 NOTE — Telephone Encounter (Signed)
Sharon Odom , Speech [facility] called to get Verbal orders on speech therapy for 1w1 and 2w4 . Verbal orders were given.

## 2021-06-26 NOTE — Telephone Encounter (Signed)
Left Sharon Odom a message that we didn't order HHST and to call with any questions.

## 2021-06-27 NOTE — Telephone Encounter (Signed)
Eliza,ST from [facility] called stating she was already seeing patient when she went in the hospital and Shelda Pal got another order to continue ST. Is this okay or should she call PCP?

## 2021-06-28 NOTE — Telephone Encounter (Signed)
Elyza notified. She will try the PCP and call back if there is an issue.

## 2021-06-28 NOTE — Telephone Encounter (Signed)
Need to call PCP, if OK- I will do if they won't, but since I didn't document in note, will have problems covering it- thanks- ML

## 2021-07-13 ENCOUNTER — Encounter: Payer: Self-pay | Admitting: Neurology

## 2021-07-13 ENCOUNTER — Ambulatory Visit (INDEPENDENT_AMBULATORY_CARE_PROVIDER_SITE_OTHER): Payer: BLUE CROSS/BLUE SHIELD | Admitting: Neurology

## 2021-07-13 VITALS — BP 114/75 | HR 92

## 2021-07-13 DIAGNOSIS — G35 Multiple sclerosis: Secondary | ICD-10-CM | POA: Diagnosis not present

## 2021-07-13 DIAGNOSIS — G8114 Spastic hemiplegia affecting left nondominant side: Secondary | ICD-10-CM

## 2021-07-13 DIAGNOSIS — G8220 Paraplegia, unspecified: Secondary | ICD-10-CM | POA: Diagnosis not present

## 2021-07-13 NOTE — Progress Notes (Signed)
Botox 100 x 3 vials  [id number] Exp-07/2023 B/B

## 2021-07-13 NOTE — Progress Notes (Signed)
PATIENT: Sharon Odom DOB: Oct 21, 1970  Chief Complaint  Patient presents with   Procedure    Room 15 w/ husband, Onalee Hua. Botox injections.     HISTORICAL   Sharon Odom is a 51 years old female, accompanied by her husband, referred by Dr. Epimenio Foot for evaluation of botulism toxin injection for spasticity due to multiple sclerosis, this is the first time I evaluated her, also performed injection today on April 18, 2018.  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  She received Lemtrada infusion in April 2017, second dose April 2018, due to continued mild progression of her symptoms, she complete her third Lemtrada infusion in October 2019.  She has been complaining of bilateral lower extremity, left upper extremity spasticity since late 2017, gradually getting worse, has tried different medications, including baclofen up to 120 mg daily, with limited benefit, she is not taking baclofen 80 mg, previously also tried and failed tizanidine, Trileptal, lower dose of botulism toxin a injection in the past only mild temporarily improvement.  She did not ambulate with walker at home, wheelchair for longer distance, complains painful frequent left lower extremity spasm, forceful left lower extremity extension, when she bear weight, she has left toes forceful painful flexion grabbing on the floor, also left shoulder, arm spasm, difficulty release left hand grip.  She was recently started on Valium 5 mg every 6 hours, which has been  helpful.  I personally reviewed the MRI of the brain and cervical spine in September 2019, the MRI of the brain shows multiple T2/FLAIR hyperintense foci in brainstem, cerebellum, left thalamus, hemisphere, consistent with typical chronic demyelinating plaque, no enhancement. MRI of cervical spine showed small T2/flair hyperintensity foci posterior to the left C3-4, several 45 also noted within the brainstem, multilevel mild degenerative changes.  No significant canal stenosis.   She also complains of urinary urgency, frequency, is taking Myrbetriq 50 mg, Wellbutrin 300 mg for depression, mild swallowing difficulty, swallowing evaluation is pending  UPDATE August 06 2018: She had first injection on April 18, 2018, we used Botox a 600 units, 400 units into spastic left lower extremity, 200 into left upper extremity,  Within 3 days, she notice weakness of left 1, 2 fingers, lasted for few weeks, she has difficulty using her left hand, but her left bicep pains has much improved,it also helped her left calf muscle spams.  She noticed increased left leg and shoulder muscle spasm since Jan 10.  UPDATE November 05 2018: She responded well to previous injection, reported 80% improvement of her left thigh muscle spasm, 50% improvement of her left shoulder, proximal arm muscle spasm, but the benefit of short lasting, she continue have bilateral toe flexion, painful, left worse than right, there was no improvement in her left calf muscle spasm, and pain.  UPDATE Sept 14 2020: She responded well to previous injection to left shoulder, but recently she complains of worsening left shoulder pain, is planning on to have evaluation and x-ray by her primary care physician, she denies significant improvement to the spasticity of her  left lower extremity, was recently evaluated by Dr. Ollen Bowl, planning on to have baclofen placement, per patient, low-dose baclofen intrathecal trial produced miracles,  UPDATE May 14 2019: She reported significant improvement with botulism toxin injection to her left shoulder, mild improvement to spasticity left lower extremity  UPDATE August 17 2019: She responded well to previous injection, left shoulder, left calf muscle spasm has much improved, she received baclofen pump by Dr. Ollen Bowl in September 2020, is going through dose adjustment,  Update November 25, 2019: Baclofen pump that was placed by Dr. Ollen Bowl in September 2020 has been helpful, especially for her lower extremity, today she just want Botox injection for her spastic left upper extremity, complains of left shoulder pain, left hand finger flexion  UPDATE Jul 12 2020: I personally reviewed MRI of the brain with without contrast in November 2021: Multiple T2/FLAIR hyperintensity foci in the brainstem, cerebellum, thalamus, hemisphere, consistent with chronic demyelinating plaque, no new lesions, no contrast-enhancement MRI of cervical spine with without contrast, hazy upper cervical cord, multiple T2 hyperintensity foci within the spinal cord at cervical medullary junction, C2-3, C3-4, C4 and C6, no contrast-enhancement, no change compared to previous scan November 2020  Also reviewed previous evaluation by Dr. Epimenio Foot, [facility] admission for delusion and hallucinations, baclofen, has really helped her spasticity, occasionally tonic spasms can be severe, more frequent tonic spasming feet, mildly increased tone in legs, difficulty lifting left leg,  She woke up October 6 noticed left wrist drop, left arm pain, and hand numbness, at baseline her left arm is weaker than right,  This has led to EMG nerve conduction study on April 19, 2020, showed normal left radial, median, ulnar sensory response.  But there is evidence of spontaneous activity at left extensor digitorum communis, extensor index proprius, wrist the possibility of left radial neuropathy,  She is here to evaluate potential Botox injection for  her reported left shoulder tightness,  UPDATE Jul 13 2021: She stopped the Botox injection for left shoulder tightness for a while, reported to improvement with baclofen pump, unfortunately she suffered infection around the pump, has to be taking out, now noticed increased left shoulder tightness, and also left lower extremity spasticity, left knee extension,  Return for Botox injection for spastic left upper and lower extremity  PHYSICAL EXAM   Vitals:   07/13/21 1238  BP: 114/75  Pulse: 92   Not recorded     There is no height or weight on file to calculate BMI.    PHYSICAL EXAMNIATION:  Gen: NAD, conversant, well nourised, well groomed                     Cardiovascular: Regular rate rhythm, no peripheral edema, warm, nontender. Eyes: Conjunctivae clear without exudates or hemorrhage Neck: Supple, no carotid bruits. Pulmonary: Clear to auscultation bilaterally   NEUROLOGICAL EXAM:  MENTAL STATUS: Speech/Cognition: Drowsy, in mild distress, slurred speech, oriented to history taking and casual conversation.  CRANIAL NERVES: CN II: Visual fields are full to confrontation.  Pupils are round equal and briskly reactive to light. CN III, IV, VI: extraocular movement are normal. No ptosis. CN V: Facial sensation is intact to light touch. CN VII: Face is symmetric with normal eye closure and smile. CN VIII: Hearing is normal to casual conversation CN IX, X: Palate elevates symmetrically. Phonation is normal. CN XI: Head turning and shoulder shrug are intact  MOTOR: right upper extremity motor strength is relatively preserved, she is able to raise left  arm against gravity, mild to moderate left upper extremity proximal and distal muscle weakness, tightness of left pectoralis muscle, tenderness upon deep palpation.  Antigravity movement of left lower extremity proximal and distal muscles,  REFLEXES: Reflexes are 2  and symmetric at the biceps, triceps, knees and absent at  ankles. Plantar responses are flexor.   COORDINATION: There is no trunk or limb ataxia.    GAIT/STANCE: Deferred  ASSESSMENT AND PLAN  Jaria Conway is a 51 y.o. female    Relapsing Remitting Multiple Sclerosis, left spasticity hemiparesis Status post baclofen pump placement by Dr. Norville Haggard in September 2020, which has helped her lower extremity spasticity, unfortunately she has suffered a pump infection, has to be taken out in 2022,   She now complains of left shoulder spasticity, painful with passive movement and deep palpitation, forceful neck extension, hope to receive Botox injection  Used Botox a 300 units under electrical stimulation guidance  Left pectoralis major 50 units Left vastus lateralis 25 units Left vastus medialis 25 units Left adductor longus 25 units Left rectus femoris 25 units Left extensor hallucis 25 units Left first and second lubricants 25 units     Levert Feinstein, M.D. Ph.D.  [facility] [address] [place], [place] [address] Ph: [phone] Fax: [phone]  CC: Referring Provider

## 2021-07-17 ENCOUNTER — Other Ambulatory Visit: Payer: Self-pay

## 2021-07-17 ENCOUNTER — Ambulatory Visit (HOSPITAL_BASED_OUTPATIENT_CLINIC_OR_DEPARTMENT_OTHER)
Admission: RE | Admit: 2021-07-17 | Discharge: 2021-07-17 | Disposition: A | Discharge status: Home / Self Care | Payer: BLUE CROSS/BLUE SHIELD | Source: Ambulatory Visit | Attending: Student | Admitting: Student

## 2021-07-17 DIAGNOSIS — T17500A Unspecified foreign body in bronchus causing asphyxiation, initial encounter: Secondary | ICD-10-CM | POA: Diagnosis not present

## 2021-07-17 DIAGNOSIS — G35 Multiple sclerosis: Secondary | ICD-10-CM | POA: Insufficient documentation

## 2021-07-17 DIAGNOSIS — G8114 Spastic hemiplegia affecting left nondominant side: Secondary | ICD-10-CM | POA: Insufficient documentation

## 2021-07-19 ENCOUNTER — Telehealth: Payer: Self-pay | Admitting: Physical Medicine and Rehabilitation

## 2021-07-19 NOTE — Telephone Encounter (Signed)
Do you want [place] to contact Andria Rhein at [place] prior to power wheelchair evaluation appointment?

## 2021-07-24 ENCOUNTER — Other Ambulatory Visit: Payer: Self-pay | Admitting: Physical Medicine and Rehabilitation

## 2021-07-24 MED ORDER — OXYCODONE HCL 10 MG PO TABS: 10.0000 mg | ORAL_TABLET | ORAL | 0 refills | Status: DC | PRN

## 2021-07-31 NOTE — Telephone Encounter (Signed)
That's fine- but usually I give you the prescription for w/c evaluation, and Debbie can see where to take that- needs to see an OT or PT to be fitted with new w/c- thanks- ML

## 2021-08-07 ENCOUNTER — Encounter: Payer: Self-pay | Admitting: Neurology

## 2021-08-14 NOTE — Progress Notes (Signed)
? ?Synopsis: Referred for pneumonia by Clovia Cuff, MD ? ?Subjective:  ? ?PATIENT ID: Sharon Odom GENDER: female DOB: [birth year], MRN: [id number] ? ?Chief Complaint  ?Patient presents with  ? Hospitalization Follow-up  ?  PFT's done today. Breathing has improved and she states coughing much less. She does still produce some sputum that is thick and clear.   ? ?51yF with history of MS s/p IT baclofen pump removal due to infection, smoking ~15py recently admitted to [facility] and seen by our service for mucus plugging LLL, subsegmental atelectasis vs pneumonia 06/18/21. ? ?We recommended course of doxycycline, SABA and flutter valve airway clearance. SLP evaluated, found to have OPD dysphagia recommending nectar thick liquids, SLP outpatient. ? ?Surveillance CT Chest with clearance of LLL secretions/pneumonia. She says she's tired and has had a long day. Hardly coughs at this point but does occasionally cough up clearish thick mucus. No DOE. No significant nasal drainage.  ? ?Otherwise pertinent review of systems is negative. ? ? ?Past Medical History:  ?Diagnosis Date  ? Allergies   ? Anxiety   ? Arthritis   ? Asthma   ? seasonal - pollen  ? Constipation   ? Depression   ? Dyspnea   ? with exertion  ? GERD (gastroesophageal reflux disease)   ? Headache   ? History of hiatal hernia   ? Left radial nerve palsy 03/15/2020  ? resolved - Left arm/hand weak  ? Multiple sclerosis ([place])   ? Pneumonia 2007  ? x 1  ? Vision abnormalities   ? Wears glasses   ?  ? ?Family History  ?Problem Relation Age of Onset  ? Diabetes Mother   ? Healthy Brother   ?  ? ?Past Surgical History:  ?Procedure Laterality Date  ? EXCISION MORTON'S NEUROMA Right   ? fallopian tube removal    ? INTRATHECAL PUMP IMPLANT Right 02/18/2019  ? Procedure: INTRATHECAL PUMP IMPLANT;  Surgeon: Clydell Hakim, MD;  Location: [place];  Service: Neurosurgery;  Laterality: Right;  INTRATHECAL PUMP IMPLANT  ? INTRATHECAL PUMP IMPLANT Right 02/17/2021  ? Procedure:  Baclofen Pump Replacement;  Surgeon: Erline Levine, MD;  Location: [place];  Service: Neurosurgery;  Laterality: Right;  3C/RM 21  ? PAIN PUMP REMOVAL Left 04/06/2021  ? Procedure: Removal of baclofen pump, Left;  Surgeon: Dawley, Theodoro Doing, DO;  Location: [place];  Service: Neurosurgery;  Laterality: Left;  Lateral/Left//3C rm 19  ? UPPER GI ENDOSCOPY    ? WISDOM TOOTH EXTRACTION    ? WISDOM TOOTH EXTRACTION    ? ? ?Social History  ? ?Socioeconomic History  ? Marital status: Married  ?  Spouse name: Not on file  ? Number of children: Not on file  ? Years of education: Not on file  ? Highest education level: Not on file  ?Occupational History  ? Not on file  ?Tobacco Use  ? Smoking status: Every Day  ?  Packs/day: 0.50  ?  Years: 32.00  ?  Pack years: 16.00  ?  Types: Cigarettes  ?  Start date: 13  ? Smokeless tobacco: Never  ?Vaping Use  ? Vaping Use: Former  ? Quit date: 05/29/2011  ?Substance and Sexual Activity  ? Alcohol use: Not Currently  ?  Comment: 2-3 times per wk  ? Drug use: Never  ? Sexual activity: Not Currently  ?  Birth control/protection: Post-menopausal  ?Other Topics Concern  ? Not on file  ?Social History Narrative  ? Right handed   ?  Caffeine: 1 cup or less per day- coffee  ? Coca cola  ? ?Social Determinants of Health  ? ?Financial Resource Strain: Not on file  ?Food Insecurity: Not on file  ?Transportation Needs: Not on file  ?Physical Activity: Not on file  ?Stress: Not on file  ?Social Connections: Not on file  ?Intimate Partner Violence: Not on file  ?  ? ?Allergies  ?Allergen Reactions  ? Ziconotide Acetate Shortness Of Breath  ?  Anorexia, weird sensations, could not talk, choking feeling, lost since of taste and smell. Hallucinations, vivid dreams. Confusion and sleepiness. N/v, increase in pain.  ?Anorexia, weird sensations, could not talk, choking feeling, lost since of taste and smell. Hallucinations, vivid dreams. Confusion and sleepiness. N/v, increase in pain.   ? Morphine Other (See  Comments)  ?  Pt does not recall reaction   ? Amoxicillin Nausea And Vomiting  ? Morphine Sulfate Other (See Comments)  ?  Unknown  ? Pregabalin Nausea And Vomiting  ?  ? ?Outpatient Medications Prior to Visit  ?Medication Sig Dispense Refill  ? acetaminophen (TYLENOL) 500 MG tablet Take 1,000 mg by mouth every 6 (six) hours as needed (pain).    ? albuterol (VENTOLIN HFA) 108 (90 Base) MCG/ACT inhaler Inhale 1-2 puffs into the lungs every 6 (six) hours as needed for wheezing or shortness of breath. 6.7 g 1  ? baclofen (LIORESAL) 20 MG tablet Take 2 tablets (40 mg total) by mouth 4 (four) times daily. Since ITB pump out (Patient taking differently: Take 40 mg by mouth 3 (three) times daily. Since ITB pump out) 240 each 5  ? buPROPion (WELLBUTRIN XL) 300 MG 24 hr tablet TAKE 1 TABLET DAILY 90 tablet 3  ? carboxymethylcellulose (REFRESH PLUS) 0.5 % SOLN Place 1 drop into both eyes 3 (three) times daily as needed (dry eyes).    ? Cholecalciferol (VITAMIN D-3) 125 MCG (5000 UT) TABS Take 5,000 Units by mouth daily. 30 tablet 1  ? conjugated estrogens (PREMARIN) vaginal cream Place 1 applicator vaginally daily.    ? Cyanocobalamin (VITAMIN B-12) 2500 MCG SUBL Place 1 tablet (2,500 mcg total) under the tongue daily. 30 tablet 0  ? dalfampridine 10 MG TB12 Take 1 tablet (10 mg total) by mouth 2 (two) times daily. One po bid 180 tablet 3  ? diazepam (VALIUM) 5 MG tablet Take 1.5 tablets (7.5 mg total) by mouth every 8 (eight) hours. 140 tablet 5  ? furosemide (LASIX) 20 MG tablet Take 20 mg by mouth daily.    ? montelukast (SINGULAIR) 10 MG tablet TAKE 1 TABLET DAILY 90 tablet 3  ? Oxycodone HCl 10 MG TABS Take 1 tablet (10 mg total) by mouth every 4 (four) hours as needed. 120 tablet 0  ? oxymetazoline (AFRIN) 0.05 % nasal spray Place 1 spray into both nostrils 2 (two) times daily as needed for congestion.    ? polyethylene glycol (MIRALAX / GLYCOLAX) 17 g packet Take 17 g by mouth every other day.    ? raNITIdine HCl  (ZANTAC PO) Take 10 mg by mouth daily.    ? senna-docusate (SENOKOT-S) 8.6-50 MG tablet Take 1 tablet by mouth daily. (Patient taking differently: Take 2 tablets by mouth daily.) 30 tablet 0  ? tiZANidine (ZANAFLEX) 4 MG tablet Take 2 tablets (8 mg total) by mouth 3 (three) times daily. 180 tablet 5  ? trospium (SANCTURA) 20 MG tablet Take 20 mg by mouth 2 (two) times daily.    ? Wheat Dextrin (BENEFIBER)  POWD Take 3.5 Scoops by mouth 2 (two) times daily.    ? nitrofurantoin, macrocrystal-monohydrate, (MACROBID) 100 MG capsule Take 100 mg by mouth 2 (two) times daily.    ? ?No facility-administered medications prior to visit.  ? ? ? ? ? ?Objective:  ? ?Physical Exam: ? ?General appearance: 51 y.o., female, NAD, drowsy but arousable ?Eyes: anicteric sclerae; PERRL, tracking appropriately ?HENT: NCAT; MMM ?Neck: Trachea midline; no lymphadenopathy, no JVD ?Lungs: CTAB, no crackles, no wheeze, with normal respiratory effort ?CV: RRR, no murmur  ?Abdomen: Soft, non-tender; non-distended, BS present  ?Extremities: No peripheral edema, warm ?Skin: Normal turgor and texture; no rash ? ? ? ?Vitals:  ? 08/15/21 1628  ?BP: 90/60  ?Pulse: 94  ?Temp: 98.3 ?F (36.8 ?C)  ?TempSrc: Oral  ?SpO2: 98%  ?Weight: 133 lb (60.3 kg)  ?Height: 5\' 9"  (1.753 m)  ? ?98% on RA ?BMI Readings from Last 3 Encounters:  ?08/15/21 19.64 kg/m?  ?06/17/21 19.79 kg/m?  ?06/16/21 19.79 kg/m?  ? ?Wt Readings from Last 3 Encounters:  ?08/15/21 133 lb (60.3 kg)  ?06/17/21 134 lb (60.8 kg)  ?06/16/21 134 lb (60.8 kg)  ? ? ? ?CBC ?   ?Component Value Date/Time  ? WBC 10.1 06/17/2021 1536  ? RBC 3.90 06/17/2021 1536  ? HGB 12.2 06/17/2021 1648  ? HCT 36.0 06/17/2021 1648  ? PLT 216 06/17/2021 1536  ? MCV 95.9 06/17/2021 1536  ? MCH 31.3 06/17/2021 1536  ? MCHC 32.6 06/17/2021 1536  ? RDW 12.6 06/17/2021 1536  ? LYMPHSABS 1.5 06/09/2021 0530  ? MONOABS 0.6 06/09/2021 0530  ? EOSABS 0.1 06/09/2021 0530  ? BASOSABS 0.0 06/09/2021 0530  ? ? ? ?Chest Imaging: ?CT  Chest 07/17/21 reviewed by me with improvement in basilar consolidation, LLL secretions. Cyst in LLL still. ? ?Pulmonary Functions Testing Results: ?PFT Results Latest Ref Rng & Units 08/15/2021  ?FVC-Pre L 2.05  ?FVC

## 2021-08-15 ENCOUNTER — Encounter: Payer: Self-pay | Admitting: Student

## 2021-08-15 ENCOUNTER — Encounter: Payer: Self-pay | Admitting: Physical Medicine and Rehabilitation

## 2021-08-15 ENCOUNTER — Other Ambulatory Visit: Payer: Self-pay

## 2021-08-15 ENCOUNTER — Ambulatory Visit (INDEPENDENT_AMBULATORY_CARE_PROVIDER_SITE_OTHER): Payer: BLUE CROSS/BLUE SHIELD | Admitting: Student

## 2021-08-15 VITALS — BP 90/60 | HR 94 | Temp 98.3°F | Ht 69.0 in | Wt 133.0 lb

## 2021-08-15 DIAGNOSIS — T17500A Unspecified foreign body in bronchus causing asphyxiation, initial encounter: Secondary | ICD-10-CM

## 2021-08-15 DIAGNOSIS — Z23 Encounter for immunization: Secondary | ICD-10-CM | POA: Diagnosis not present

## 2021-08-15 DIAGNOSIS — G35 Multiple sclerosis: Secondary | ICD-10-CM | POA: Diagnosis not present

## 2021-08-15 LAB — PULMONARY FUNCTION TEST
DL/VA % pred: 156 %
DL/VA: 6.51 ml/min/mmHg/L
DLCO cor % pred: 38 %
DLCO cor: 9.58 ml/min/mmHg
DLCO unc % pred: 38 %
DLCO unc: 9.58 ml/min/mmHg
FEF 25-75 Pre: 1.32 L/sec
FEF2575-%Pred-Pre: 42 %
FEV1-%Pred-Pre: 46 %
FEV1-Pre: 1.55 L
FEV1FVC-%Pred-Pre: 94 %
FEV6-%Pred-Pre: 50 %
FEV6-Pre: 2.05 L
FEV6FVC-%Pred-Pre: 102 %
FVC-%Pred-Pre: 48 %
FVC-Pre: 2.05 L
Pre FEV1/FVC ratio: 76 %
Pre FEV6/FVC Ratio: 100 %

## 2021-08-15 NOTE — Progress Notes (Signed)
Spirometry pre/post and DLCO performed today. 

## 2021-08-15 NOTE — Patient Instructions (Signed)
-   Would discuss obtaining echocardiogram given finding of enlarged heart on ct scan ?- prevnar 20 vaccination today to decrease risk of recurrent pneumonia ?- come back to see [place] if you develop shortness of breath or bothersome cough ?

## 2021-08-15 NOTE — Patient Instructions (Signed)
Spirometry pre/post and DLCO performed today. 

## 2021-08-29 ENCOUNTER — Other Ambulatory Visit: Payer: Self-pay | Admitting: Neurological Surgery

## 2021-08-30 NOTE — Telephone Encounter (Signed)
Can you put the order in?

## 2021-09-06 ENCOUNTER — Encounter: Payer: Self-pay | Admitting: Neurology

## 2021-09-14 ENCOUNTER — Ambulatory Visit (INDEPENDENT_AMBULATORY_CARE_PROVIDER_SITE_OTHER): Payer: BLUE CROSS/BLUE SHIELD | Admitting: Neurology

## 2021-09-14 ENCOUNTER — Encounter: Payer: Self-pay | Admitting: Neurology

## 2021-09-14 VITALS — BP 121/74 | HR 82 | Ht 69.0 in

## 2021-09-14 DIAGNOSIS — R208 Other disturbances of skin sensation: Secondary | ICD-10-CM | POA: Diagnosis not present

## 2021-09-14 DIAGNOSIS — G35 Multiple sclerosis: Secondary | ICD-10-CM

## 2021-09-14 DIAGNOSIS — G8114 Spastic hemiplegia affecting left nondominant side: Secondary | ICD-10-CM

## 2021-09-14 DIAGNOSIS — G8220 Paraplegia, unspecified: Secondary | ICD-10-CM

## 2021-09-14 DIAGNOSIS — R269 Unspecified abnormalities of gait and mobility: Secondary | ICD-10-CM

## 2021-09-14 NOTE — Progress Notes (Signed)
? ?[facility] ? ?PATIENT: Sharon Odom ?DOB: Oct 12, 1970 ? ?REFERRING DOCTOR OR PCP:  Jeannie Fend ?SOURCE: Patient, notes imaging and lab reports, MRI images on CD from neurology, ? ?_________________________________ ? ? ?HISTORICAL  ? ?CHIEF COMPLAINT:  ?Chief Complaint  ?Patient presents with  ? Follow-up  ?  RM 2 in [place]. Last seen 07/13/21 for botox. ?Having more constant leg pain/aches. Reports dysarthria. Feels it is related to ziconotide acetate (stopped in 2021). Feels she is still having SE from this.  ? ? ?HISTORY OF PRESENT ILLNESS: ?Sharon Odom is a 51 y.o. woman with active/relapsing secondary progressive multiple sclerosis, left greater than right spasticity and poor gait. ? ?Update  09/14/2021 ?Her last [place] Falling was January 2021 (fourth course). She is compliant with the REMS program.  She has no bruising or evidence of ITP.  No evidence of renal insufficiency..  She has occasional UTIs.    No recent exacerbation.    ? ?She reports more dysarthria.    She has had a swallow study and consistency of her diet was changed.  She is doing better with nectar consistency and will be advancing thickness further.      She was on antibiotics for a mild aspiration pneumonia.   She is following up for a chest x-ray.  She has had a lot of abdominal bloating and was found to be constipated despite Miralax, fiber and Senokot.   She will be having a colonoscopy.    ? ?She continues to experience a lot of left > right spasticity.    It worsened early this year when she had an infection.   She is on tizanidine 4 mg, 6 pills a day (8 mg po tid) . And baclofen 40 mg 3 times a day.    She has a baclofen pump and felt  it helped but due to infection it was removed 04/05/2021.  It will be re-installed [facility]).     Setting were up to   869.8 mcg/day  She will be seeing Dr. Dagoberto Ligas [facility].  She is getting Botox on the left and it is helping some.     ? ?She can take some steps with a  walker but not as many as last year.    ? ?She woke up March 02, 2020 with a left wrist drop and left arm pain and hand numbness.   NCV/EMG confirmed a radial palsy.   She spontaneously improved over 8-10 weeks.    ? ?She had some delusions while on Prialt but these have no recurred.    She still feels cognition did not return completely to baseline ? ? ?Impression 04/19/2020: ?This NCV/EMG study shows the following: ?1.   Severe subacute left radial neuropathy.  Voluntary motor units were noted in the EDC muscle. ?2.   No evidence of significant radiculopathy. ? ?MS history: ?She was diagnosed in October 2013 due to difficulty with her handwriting and right hand coordination.   A few weeks later she had gait ataxia and numbness and was referred to MS.   She had an MRI of the brain (was in [place] at the time) and then had a LP confirming the diagnosis.  Initially, she was placed on Copaxone but had severe frequent exacerbations and then went to Tecfidera due to breakthrough exacerbations.  She was referred to [facility] for Tysabri (Feb 2014 to November 2016).  She then moved to [facility] and saw Dr. Gardenia Phlegm [facility] [phone]).   She was always JCV  Ab titer positive.    She did well on it but stopped 03/2015 due to safety risk.  She had more spasticity early 2017 that improved with 5 days IV Solumedrol and then started [facility] April 2017.   The infusion went well.    She started having more spasms in her legs later in 2017 and had Botox injections into her feet (some benefit but only for 3-4 weeks).   She had a second course of Botox November 2017 without much benefit.     She had numbness and tingling in her left arm November 2017 and was given an ESI.   Medical MJ did not help.   She had her second course of Lemtrada in April 2018 but the steroid did not help her spasticity as it did the first year.    She saw Dr. Pearletha Forge at [facility] a couple times in 2018 and also saw Dr. Higinio Plan at  [place]. I started to see her in 2019.  A third course of Lemtrada was infused in October 2019.  A 4th course was infused January 2021. ? ?Due to spasticity, she had Botox injections, initially with benefit. However, due to continued symptoms, a baclofen pump placed September 2020. She is followed at [place] [facility] (Dr. Luan Pulling) ? ?Imaging:  ?MRI of the brain 03/19/2017 shows multiple T2/FLAIR hyperintense foci in the cerebellum, pons, left greater than right thalamus and in the periventricular, juxtacortical and deep white matter of both hemispheres.     ? ?MRI of the brain 03/31/2019 was felt to be slightly progressed compared to her previous MRI in 2019.  However, by my interpretation the MRI is stable.  It shows T2/FLAIR hyperintense foci in the hemispheres, thalamus, pons and cerebellum consistent with MS ? ?MRI of the cervical and thoracic spine 03/31/2019 showed a discrete lesion posteriorly to the left at C3-C4 and subtle patchy multifocal cord signal abnormalities in the cervical spine.  By my interpretation the MRI is stable compared to 2019.  The thoracic spinal cord was normal. ? ?REVIEW OF SYSTEMS: ?Constitutional: No fevers, chills, sweats, or change in appetite.  She reports fatigue. ?Eyes: As above ?Ear, nose and throat: No hearing loss, ear pain, nasal congestion, sore throat ?Cardiovascular: No chest pain, palpitations ?Respiratory:  No shortness of breath at rest or with exertion.   No wheezes ?GastrointestinaI: No nausea, vomiting, diarrhea, abdominal pain, fecal incontinence ?Genitourinary: As above  ?musculoskeletal: She has pain in her legs, left greater than right due to spasticity.  She notes mild back pain. ?Integumentary: No rash, pruritus, skin lesions ?Neurological: as above ?Psychiatric: As above ?Endocrine: No palpitations, diaphoresis, change in appetite, change in weigh or increased thirst ?Hematologic/Lymphatic:  No anemia, purpura, petechiae. ?Allergic/Immunologic: No  itchy/runny eyes, nasal congestion, recent allergic reactions, rashes ? ?ALLERGIES: ?Allergies  ?Allergen Reactions  ? Ziconotide Acetate Shortness Of Breath  ?  Anorexia, weird sensations, could not talk, choking feeling, lost since of taste and smell. Hallucinations, vivid dreams. Confusion and sleepiness. N/v, increase in pain.  ?Anorexia, weird sensations, could not talk, choking feeling, lost since of taste and smell. Hallucinations, vivid dreams. Confusion and sleepiness. N/v, increase in pain.   ? Morphine Other (See Comments)  ?  Pt does not recall reaction   ? Amoxicillin Nausea And Vomiting  ? Morphine Sulfate Other (See Comments)  ?  Unknown  ? Pregabalin Nausea And Vomiting  ? ? ?HOME MEDICATIONS: ? ?Current Outpatient Medications:  ?  acetaminophen (TYLENOL) 500 MG tablet, Take 1,000 mg  by mouth every 6 (six) hours as needed (pain)., Disp: , Rfl:  ?  albuterol (VENTOLIN HFA) 108 (90 Base) MCG/ACT inhaler, Inhale 1-2 puffs into the lungs every 6 (six) hours as needed for wheezing or shortness of breath., Disp: 6.7 g, Rfl: 1 ?  baclofen (LIORESAL) 20 MG tablet, Take 2 tablets (40 mg total) by mouth 4 (four) times daily. Since ITB pump out (Patient taking differently: Take 40 mg by mouth 3 (three) times daily. Since ITB pump out), Disp: 240 each, Rfl: 5 ?  buPROPion (WELLBUTRIN XL) 300 MG 24 hr tablet, TAKE 1 TABLET DAILY, Disp: 90 tablet, Rfl: 3 ?  carboxymethylcellulose (REFRESH PLUS) 0.5 % SOLN, Place 1 drop into both eyes 3 (three) times daily as needed (dry eyes)., Disp: , Rfl:  ?  Cholecalciferol (VITAMIN D-3) 125 MCG (5000 UT) TABS, Take 5,000 Units by mouth daily., Disp: 30 tablet, Rfl: 1 ?  conjugated estrogens (PREMARIN) vaginal cream, Place 1 applicator vaginally daily., Disp: , Rfl:  ?  Cyanocobalamin (VITAMIN B-12) 2500 MCG SUBL, Place 1 tablet (2,500 mcg total) under the tongue daily., Disp: 30 tablet, Rfl: 0 ?  dalfampridine 10 MG TB12, Take 1 tablet (10 mg total) by mouth 2 (two) times  daily. One po bid, Disp: 180 tablet, Rfl: 3 ?  diazepam (VALIUM) 5 MG tablet, Take 1.5 tablets (7.5 mg total) by mouth every 8 (eight) hours., Disp: 140 tablet, Rfl: 5 ?  furosemide (LASIX) 20 MG tablet, Take 20 mg by

## 2021-09-18 NOTE — Pre-Procedure Instructions (Signed)
Surgical Instructions ? ? ? Your procedure is scheduled on 09/26/21. ? Report to [facility] Main Entrance "A" at 05:30 A.M., then check in with the Admitting office. ? Call this number if you have problems the morning of surgery: ? [phone] ? ? If you have any questions prior to your surgery date call [phone]: Open Monday-Friday 8am-4pm ? ? ? Remember: ? Do not eat after midnight the night before your surgery ? ?You may drink clear liquids until 04:30 the morning of your surgery.   ?Clear liquids allowed are: Water, Non-Citrus Juices (without pulp), Carbonated Beverages, Clear Tea, Black Coffee ONLY (NO MILK, CREAM OR POWDERED CREAMER of any kind), and Gatorade ?  ? Take these medicines the morning of surgery with A SIP OF WATER:  ?baclofen (LIORESAL ?buPROPion (WELLBUTRIN XL ?dalfampridine  ?raNITIdine HCl  ?trospium (SANCTURA) ? ?If needed: ?acetaminophen (TYLENOL) ?albuterol (VENTOLIN HFA) ?carboxymethylcellulose (REFRESH PLUS ?oxymetazoline (AFRIN ? ?Please bring all inhalers with you the day of surgery.  ? ?As of today, STOP taking any Aspirin (unless otherwise instructed by your surgeon) Aleve, Naproxen, Ibuprofen, Motrin, Advil, Goody's, BC's, all herbal medications, fish oil, and all vitamins. ? ?         ?Do not wear jewelry or makeup ?Do not wear lotions, powders, perfumes/colognes, or deodorant. ?Do not shave 48 hours prior to surgery.  Men may shave face and neck. ?Do not bring valuables to the hospital. ?Do not wear nail polish, gel polish, artificial nails, or any other type of covering on natural nails (fingers and toes) ?If you have artificial nails or gel coating that need to be removed by a nail salon, please have this removed prior to surgery. Artificial nails or gel coating may interfere with anesthesia's ability to adequately monitor your vital signs. ? ?[facility] is not responsible for any belongings or valuables. .  ? ?Do NOT Smoke (Tobacco/Vaping)  24 hours prior to your  procedure ? ?If you use a CPAP at night, you may bring your mask for your overnight stay. ?  ?Contacts, glasses, hearing aids, dentures or partials may not be worn into surgery, please bring cases for these belongings ?  ?For patients admitted to the hospital, discharge time will be determined by your treatment team. ?  ?Patients discharged the day of surgery will not be allowed to drive home, and someone needs to stay with them for 24 hours. ? ? ?SURGICAL WAITING ROOM VISITATION ?Patients having surgery or a procedure in a hospital may have two support people. ?Children under the age of 33 must have an adult with them who is not the patient. ?They may stay in the waiting area during the procedure and may switch out with other visitors. If the patient needs to stay at the hospital during part of their recovery, the visitor guidelines for inpatient rooms apply. ? ?Please refer to the [facility] website for the visitor guidelines for Inpatients (after your surgery is over and you are in a regular room).  ? ? ?Special instructions:   ? ?Oral Hygiene is also important to reduce your risk of infection.  Remember - BRUSH YOUR TEETH THE MORNING OF SURGERY WITH YOUR REGULAR TOOTHPASTE ? ? ?[facility] [place]- Preparing For Surgery ? ?Before surgery, you can play an important role. Because skin is not sterile, your skin needs to be as free of germs as possible. You can reduce the number of germs on your skin by washing with CHG (chlorahexidine gluconate) Soap before surgery.  CHG is an  antiseptic cleaner which kills germs and bonds with the skin to continue killing germs even after washing.   ? ? ?Please do not use if you have an allergy to CHG or antibacterial soaps. If your skin becomes reddened/irritated stop using the CHG.  ?Do not shave (including legs and underarms) for at least 48 hours prior to first CHG shower. It is OK to shave your face. ? ?Please follow these instructions carefully. ?  ? ? Shower the NIGHT BEFORE  SURGERY and the MORNING OF SURGERY with CHG Soap.  ? If you chose to wash your hair, wash your hair first as usual with your normal shampoo. After you shampoo, rinse your hair and body thoroughly to remove the shampoo.  Then [employer] and genitals (private parts) with your normal soap and rinse thoroughly to remove soap. ? ?After that Use CHG Soap as you would any other liquid soap. You can apply CHG directly to the skin and wash gently with a scrungie or a clean washcloth.  ? ?Apply the CHG Soap to your body ONLY FROM THE NECK DOWN.  Do not use on open wounds or open sores. Avoid contact with your eyes, ears, mouth and genitals (private parts). Wash Face and genitals (private parts)  with your normal soap.  ? ?Wash thoroughly, paying special attention to the area where your surgery will be performed. ? ?Thoroughly rinse your body with warm water from the neck down. ? ?DO NOT shower/wash with your normal soap after using and rinsing off the CHG Soap. ? ?Pat yourself dry with a CLEAN TOWEL. ? ?Wear CLEAN PAJAMAS to bed the night before surgery ? ?Place CLEAN SHEETS on your bed the night before your surgery ? ?DO NOT SLEEP WITH PETS. ? ? ?Day of Surgery: ? ?Take a shower with CHG soap. ?Wear Clean/Comfortable clothing the morning of surgery ?Do not apply any deodorants/lotions.   ?Remember to brush your teeth WITH YOUR REGULAR TOOTHPASTE. ? ? ?If you received a COVID test during your pre-op visit, it is requested that you wear a mask when out in public, stay away from anyone that may not be feeling well, and notify your surgeon if you develop symptoms. If you have been in contact with anyone that has tested positive in the last 10 days, please notify your surgeon. ? ?  ?Please read over the following fact sheets that you were given.  ? ?

## 2021-09-19 ENCOUNTER — Inpatient Hospital Stay (HOSPITAL_COMMUNITY)
Admission: RE | Admit: 2021-09-19 | Discharge: 2021-09-19 | Disposition: A | Discharge status: Home / Self Care | Payer: BLUE CROSS/BLUE SHIELD | Source: Ambulatory Visit

## 2021-09-19 NOTE — Progress Notes (Addendum)
1115 ?Attempted to reach patient via phone regarding her preadmission testing appointment at 1100.  The number on file is not a valid number.  ? ?1127 ?Tried again and went straight to voice mail.  Left her a message to call and reschedule with [place].   ?

## 2021-09-22 ENCOUNTER — Encounter
Payer: BLUE CROSS/BLUE SHIELD | Attending: Physical Medicine and Rehabilitation | Admitting: Physical Medicine and Rehabilitation

## 2021-09-22 ENCOUNTER — Encounter: Payer: Self-pay | Admitting: Physical Medicine and Rehabilitation

## 2021-09-22 VITALS — BP 122/80 | HR 77 | Ht 69.0 in | Wt 128.0 lb

## 2021-09-22 DIAGNOSIS — Z79899 Other long term (current) drug therapy: Secondary | ICD-10-CM

## 2021-09-22 DIAGNOSIS — G894 Chronic pain syndrome: Secondary | ICD-10-CM

## 2021-09-22 DIAGNOSIS — Z993 Dependence on wheelchair: Secondary | ICD-10-CM | POA: Diagnosis present

## 2021-09-22 DIAGNOSIS — M62838 Other muscle spasm: Secondary | ICD-10-CM | POA: Diagnosis present

## 2021-09-22 DIAGNOSIS — G35 Multiple sclerosis: Secondary | ICD-10-CM | POA: Diagnosis present

## 2021-09-22 DIAGNOSIS — Z5181 Encounter for therapeutic drug level monitoring: Secondary | ICD-10-CM

## 2021-09-22 MED ORDER — LINACLOTIDE 145 MCG PO CAPS: 145.0000 ug | ORAL_CAPSULE | Freq: Every day | ORAL | 5 refills | Status: DC

## 2021-09-22 NOTE — Patient Instructions (Signed)
?  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 Hoarseness AND dysarthria; not just 1 issue. Insomnia and ITB refills  ?ITB pump removed due to pump infection 11/10 by Dr Reatha Armour- here for f/u on spasticity and possible MS flare.  ? ?Start ITB pump at 100 mcg/day- - prefer 2000 mcg/ml if possible.  ? ?2. Will send Rx to [facility] script on pad saying start at 110 mcg/day and increase q3-4 days by 10% with goal of 700 mcg/day- I will see q 8month to check your spasticity and see where we're heading. Ultimate goal was 787 last time and wanted to go up, but want to make sure doesn't need less this time.  Waiting for fax number to send to them.  ? ? ?3. Can still get Botox for hands and feet curling.  ?Dr Evelena Leyden- but will determine if does AFTER May 2023.  ? ?4. Need to call Speech therapy H/H and see what's going on for thickened liquids. Sometimes they want pt to be on thickened liquids for life.  ?Speech therapy needs to be clear if needs thickened liquids- Usually needs PCP or Pulmonary to f/u with possible Swallowing test- had silent aspiration at the time the swallow study was done, so I think likely needs another modified barium swallow study- before she could be cleared to go on regular thin liquids - if needs a MBS ordered and PCP will not order, then I will order, but I would prefer who started this process continue to follow up ? ? ? ?5. Sees me June 5th for w/c evaluation. Will need to discuss power w/c manual, etc.  ? ? ?6. Linzess- In setting of severe constipation in spite of multiple doses of bowels meds- - due to neurologically based constipation made worse by Baclofen and Opiates- will get worse when restart ITB/baclofen pump- so will need to prevent bowel obstruction long term.  ? ?7.  ?Will likely still ned at least some of oral over the counter bowel meds with Linzess, but should improve constipation. ? ?8. Take an extra dose of senna on the  days they titrate your pump up-  ? ?9.  Continue Baclofen PO and don't decreas the dose quite yet.  ? ?10. Con't Oxycodone - doesn't need refills.  ? ?11. F/U in June for W/C evaluation.  ?

## 2021-09-22 NOTE — Progress Notes (Signed)
? ?Subjective:  ? ? Patient ID: Sharon Odom, female    DOB: Apr 15, 1971, 51 y.o.   MRN: [id number] ? ?HPI ? 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 Hoarseness AND dysarthria; not just 1 issue. Insomnia and ITB refills  ?ITB pump removed due to pump infection 11/10 by Dr Reatha Armour- here for f/u on spasticity and possible MS flare.  ? ?Has Botox on 2/18- and scheduled for 5/18  and toes still curled.  ? ? ?Has surgery for ITB pump to be placed 09/26/21.  ? ?No more pneumonia-  ?Now on nectar thick liquids-  ?Was a recommendation? Per husband.  ?Having [facility]- speech therapy-  was discharged from them- H/H ? ?Had colonoscopy Tuesday and took 4 days to prep for it- hasn't pooped yet/since.  ? ?Miralax 1-2x/day- ?Senna- 2 tabs/day ?Benefiber 3 scoops daily ?Psyllium ? ? ?Pain Inventory ?Average Pain 5 ?Pain Right Now 7 ?My pain is sharp, tingling, and aching ? ?LOCATION OF PAIN  neck, thigh, leg, toe, left arm, hand fingers, groin ? ?BOWEL ?Number of stools per week: 4-7 ?Oral laxative use Yes  ?Type of laxative Senokot, Miralax ? ? ?BLADDER ?Normal ? ?Bladder incontinence Yes  ?Frequent urination Yes  ? ? ? ? ? ?Mobility ?walk without assistance ?walk with assistance ?how many minutes can you walk? 5-10 minutes ?use a wheelchair ?needs help with transfers ?Do you have any goals in this area?  yes ? ?Function ?disabled: date disabled 2013 ?I need assistance with the following:  meal prep, household duties, and shopping ?Do you have any goals in this area?  yes ? ?Neuro/Psych ?weakness ?numbness ?tingling ?trouble walking ?spasms ?dizziness ?confusion ? ?Prior Studies ?Any changes since last visit?  no ? ?Physicians involved in your care ?Neurologist Dr Felecia Shelling ? ? ?Family History  ?Problem Relation Age of Onset  ? Diabetes Mother   ? Healthy Brother   ? ?Social History  ? ?Socioeconomic History  ? Marital status: Married  ?  Spouse name: Not on file  ? Number of  children: Not on file  ? Years of education: Not on file  ? Highest education level: Not on file  ?Occupational History  ? Not on file  ?Tobacco Use  ? Smoking status: Every Day  ?  Packs/day: 0.50  ?  Years: 32.00  ?  Pack years: 16.00  ?  Types: Cigarettes  ?  Start date: 50  ? Smokeless tobacco: Never  ?Vaping Use  ? Vaping Use: Former  ? Quit date: 05/29/2011  ?Substance and Sexual Activity  ? Alcohol use: Not Currently  ?  Comment: 2-3 times per wk  ? Drug use: Never  ? Sexual activity: Not Currently  ?  Birth control/protection: Post-menopausal  ?Other Topics Concern  ? Not on file  ?Social History Narrative  ? Right handed   ? Caffeine: 1 cup or less per day- coffee  ? Coca cola  ? ?Social Determinants of Health  ? ?Financial Resource Strain: Not on file  ?Food Insecurity: Not on file  ?Transportation Needs: Not on file  ?Physical Activity: Not on file  ?Stress: Not on file  ?Social Connections: Not on file  ? ?Past Surgical History:  ?Procedure Laterality Date  ? EXCISION MORTON'S NEUROMA Right   ? fallopian tube removal    ? INTRATHECAL PUMP IMPLANT Right 02/18/2019  ? Procedure: INTRATHECAL PUMP IMPLANT;  Surgeon: Clydell Hakim, MD;  Location: [place] [facility];  Service: Neurosurgery;  Laterality: Right;  INTRATHECAL PUMP IMPLANT  ? INTRATHECAL PUMP IMPLANT Right 02/17/2021  ? Procedure: Baclofen Pump Replacement;  Surgeon: Erline Levine, MD;  Location: [place];  Service: Neurosurgery;  Laterality: Right;  3C/RM 21  ? PAIN PUMP REMOVAL Left 04/06/2021  ? Procedure: Removal of baclofen pump, Left;  Surgeon: Dawley, Theodoro Doing, DO;  Location: [facility];  Service: Neurosurgery;  Laterality: Left;  Lateral/Left//3C rm 19  ? UPPER GI ENDOSCOPY    ? WISDOM TOOTH EXTRACTION    ? WISDOM TOOTH EXTRACTION    ? ?Past Medical History:  ?Diagnosis Date  ? Allergies   ? Anxiety   ? Arthritis   ? Asthma   ? seasonal - pollen  ? Constipation   ? Depression   ? Dyspnea   ? with exertion  ? GERD (gastroesophageal reflux disease)   ? Headache    ? History of hiatal hernia   ? Left radial nerve palsy 03/15/2020  ? resolved - Left arm/hand weak  ? Multiple sclerosis ([place])   ? Pneumonia 2007  ? x 1  ? Vision abnormalities   ? Wears glasses   ? ?BP 122/80   Pulse 77   Ht 5\' 9"  (1.753 m)   Wt 128 lb (58.1 kg)   SpO2 97%   BMI 18.90 kg/m?  ? ?Opioid Risk Score:   ?Fall Risk Score:  `1 ? ?Depression screen PHQ 2/9 ? ? ?  06/16/2021  ?  1:43 PM 04/17/2021  ?  2:04 PM 12/21/2020  ? 12:07 PM 08/08/2020  ?  3:30 PM 02/08/2020  ?  2:10 PM 12/14/2019  ?  3:07 PM 09/07/2019  ?  3:43 PM  ?Depression screen PHQ 2/9  ?Decreased Interest 0 0 0 1 1 0 1  ?Down, Depressed, Hopeless 0 0 0 1 1 0 1  ?PHQ - 2 Score 0 0 0 2 2 0 2  ? ? ?Review of Systems  ?Respiratory:  Positive for cough.   ?Cardiovascular:   ?     Limb swelling  ?Gastrointestinal:  Positive for constipation and nausea.  ?Musculoskeletal:  Positive for gait problem.  ?All other systems reviewed and are negative. ? ?   ?Objective:  ? Physical Exam ? ?Awake, alert, hoarse voice; in manual w/c; accompanied by husband, NAD ?Spasticity still poorly controlled.  ?Curling of toes and fingers ? ? ?   ?Assessment & Plan:  ? 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 Hoarseness AND dysarthria; not just 1 issue. Insomnia and ITB refills  ?ITB pump removed due to pump infection 11/10 by Dr Reatha Armour- here for f/u on spasticity and possible MS flare.  ? ?Start ITB pump at 100 mcg/day- - prefer 2000 mcg/ml if possible.  ? ?2. Will send Rx to [facility] script on pad saying start at 110 mcg/day and increase q3-4 days by 10% with goal of 700 mcg/day- I will see q 56month to check your spasticity and see where we're heading. Ultimate goal was 787 last time and wanted to go up, but want to make sure doesn't need less this time.  Waiting for fax number to send to them.  ? ? ?3. Can still get Botox for hands and feet curling.  ?Dr Evelena Leyden- but will determine if does AFTER May 2023.   ? ?4. Need to call Speech therapy H/H and see what's going on for thickened liquids. Sometimes they want pt  to be on thickened liquids for life.  ?Speech therapy needs to be clear if needs thickened liquids- Usually needs PCP or Pulmonary to f/u with possible Swallowing test- had silent aspiration at the time the swallow study was done, so I think likely needs another modified barium swallow study- before she could be cleared to go on regular thin liquids - if needs a MBS ordered and PCP will not order, then I will order, but I would prefer who started this process continue to follow up ? ? ? ?5. Sees me June 5th for w/c evaluation. Will need to discuss power w/c manual, etc.  ? ? ?6. Linzess- In setting of severe constipation in spite of multiple doses of bowels meds- - due to neurologically based constipation made worse by Baclofen and Opiates- will get worse when restart ITB/baclofen pump- so will need to prevent bowel obstruction long term.  ? ?7.  ?Will likely still ned at least some of oral over the counter bowel meds with Linzess, but should improve constipation. ? ?8. Take an extra dose of senna on the days they titrate your pump up-  ? ?9.  Continue Baclofen PO and don't decreas the dose quite yet.  ? ?10. Con't Oxycodone - doesn't need refills.  ? ?11. F/U in June for W/C evaluation. Double visit q3 months and single visit monthly ? ?I spent a total of 43   minutes on total care today- >50% coordination of care- due to prolonged d/w with spasticity, talking with [facility] about ITB pump; and as discussed above.  ? ?

## 2021-09-22 NOTE — Progress Notes (Deleted)
? ?  Subjective:  ? ? Patient ID: Sharon Odom, female    DOB: 1971/01/17, 51 y.o.   MRN: [id number] ? ?HPI ? ? ?.cpr ?Review of Systems ? ?   ?Objective:  ? Physical Exam ? ? ? ? ?   ?Assessment & Plan:  ? ? ?

## 2021-09-25 ENCOUNTER — Encounter (HOSPITAL_COMMUNITY): Payer: Self-pay | Admitting: Neurological Surgery

## 2021-09-25 ENCOUNTER — Other Ambulatory Visit: Payer: Self-pay | Admitting: Neurosurgery

## 2021-09-25 NOTE — Progress Notes (Signed)
TWO VISITORS ARE ALLOWED TO COME WITH YOU AND STAY IN THE SURGICAL WAITING ROOM ONLY DURING PRE OP AND PROCEDURE DAY OF SURGERY.  ? ?Two VISITORS MAY VISIT WITH YOU AFTER SURGERY IN YOUR PRIVATE ROOM DURING VISITING HOURS ONLY! ? ?PCP - Baker Pierini, FNP ?Cardiologist - n/a ?Neurology - Dr Arlice Colt ? ?CT Chest x-ray - 07/17/21 ?EKG - 06/19/21 ?Stress Test - n/a ?ECHO - n/a ?Cardiac Cath - n/a ? ?ICD Pacemaker/Loop - n/a ? ?Sleep Study -  n/a ?CPAP - none ? ?Anesthesia review: Yes ? ?STOP now taking any Aspirin (unless otherwise instructed by your surgeon), Aleve, Naproxen, Ibuprofen, Motrin, Advil, Goody's, BC's, all herbal medications, fish oil, and all vitamins.  ? ?Coronavirus Screening ?Do you have any of the following symptoms:  ?Cough yes/no: No ?Fever (>100.92F)  yes/no: No ?Runny nose yes/no: No ?Sore throat yes/no: No ?Difficulty breathing/shortness of breath  yes/no: No ? ?Have you traveled in the last 14 days and where? yes/no: No ? ?Patient verbalized understanding of instructions that were given via phone. ?

## 2021-09-26 ENCOUNTER — Encounter: Payer: Self-pay | Admitting: Physical Medicine and Rehabilitation

## 2021-09-28 ENCOUNTER — Other Ambulatory Visit: Payer: Self-pay

## 2021-09-28 ENCOUNTER — Observation Stay (HOSPITAL_COMMUNITY)
Admission: RE | Admit: 2021-09-28 | Discharge: 2021-09-29 | Disposition: A | Discharge status: Home Health | Payer: BLUE CROSS/BLUE SHIELD | Attending: Neurological Surgery | Admitting: Neurological Surgery

## 2021-09-28 ENCOUNTER — Encounter (HOSPITAL_COMMUNITY)
Admission: RE | Disposition: A | Discharge status: Home Health | Payer: Self-pay | Source: Home / Self Care | Attending: Neurological Surgery

## 2021-09-28 ENCOUNTER — Ambulatory Visit (HOSPITAL_COMMUNITY): Payer: BLUE CROSS/BLUE SHIELD | Admitting: Emergency Medicine

## 2021-09-28 ENCOUNTER — Encounter (HOSPITAL_COMMUNITY): Payer: Self-pay | Admitting: Neurological Surgery

## 2021-09-28 ENCOUNTER — Ambulatory Visit (HOSPITAL_COMMUNITY): Payer: BLUE CROSS/BLUE SHIELD

## 2021-09-28 DIAGNOSIS — R252 Cramp and spasm: Secondary | ICD-10-CM | POA: Diagnosis present

## 2021-09-28 DIAGNOSIS — G35 Multiple sclerosis: Principal | ICD-10-CM | POA: Insufficient documentation

## 2021-09-28 DIAGNOSIS — J45909 Unspecified asthma, uncomplicated: Secondary | ICD-10-CM | POA: Insufficient documentation

## 2021-09-28 DIAGNOSIS — M62838 Other muscle spasm: Secondary | ICD-10-CM | POA: Diagnosis not present

## 2021-09-28 DIAGNOSIS — F17210 Nicotine dependence, cigarettes, uncomplicated: Secondary | ICD-10-CM | POA: Diagnosis not present

## 2021-09-28 HISTORY — PX: PAIN PUMP IMPLANTATION: SHX330

## 2021-09-28 LAB — BASIC METABOLIC PANEL
Anion gap: 9 (ref 5–15)
BUN: 11 mg/dL (ref 6–20)
CO2: 25 mmol/L (ref 22–32)
Calcium: 9.7 mg/dL (ref 8.9–10.3)
Chloride: 103 mmol/L (ref 98–111)
Creatinine, Ser: 0.84 mg/dL (ref 0.44–1.00)
GFR, Estimated: >60 mL/min (ref >60)
Glucose, Bld: 85 mg/dL (ref 70–99)
Potassium: 4.1 mmol/L (ref 3.5–5.1)
Sodium: 137 mmol/L (ref 135–145)

## 2021-09-28 LAB — CBC
HCT: 46.8 % — ABNORMAL HIGH (ref 36.0–46.0)
Hemoglobin: 15.5 g/dL — ABNORMAL HIGH (ref 12.0–15.0)
MCH: 31.4 pg (ref 26.0–34.0)
MCHC: 33.1 g/dL (ref 30.0–36.0)
MCV: 94.7 fL (ref 80.0–100.0)
Platelets: 134 10*3/uL — ABNORMAL LOW (ref 150–400)
RBC: 4.94 MIL/uL (ref 3.87–5.11)
RDW: 12.6 % (ref 11.5–15.5)
WBC: 5.7 10*3/uL (ref 4.0–10.5)
nRBC: 0 % (ref 0.0–0.2)

## 2021-09-28 SURGERY — PAIN PUMP INSERTION
Anesthesia: General | Site: Flank

## 2021-09-28 MED ORDER — CHLORHEXIDINE GLUCONATE 0.12 % MT SOLN: 15.0000 mL | Freq: Once | OROMUCOSAL | Status: AC

## 2021-09-28 MED ORDER — OXYCODONE HCL 5 MG PO TABS: 5.0000 mg | ORAL_TABLET | Freq: Once | ORAL | Status: AC | PRN

## 2021-09-28 MED ORDER — BUPIVACAINE HCL (PF) 0.5 % IJ SOLN
INTRAMUSCULAR | Status: AC
  Filled 2021-09-28: qty 30

## 2021-09-28 MED ORDER — SENNOSIDES-DOCUSATE SODIUM 8.6-50 MG PO TABS
2.0000 | ORAL_TABLET | Freq: Every day | ORAL | Status: DC
  Administered 2021-09-28 – 2021-09-29 (×2): 2 via ORAL
  Filled 2021-09-28 (×2): qty 2

## 2021-09-28 MED ORDER — BENEFIBER PO POWD: 3.5000 | Freq: Two times a day (BID) | ORAL | Status: DC

## 2021-09-28 MED ORDER — DIAZEPAM 5 MG PO TABS
7.5000 mg | ORAL_TABLET | Freq: Three times a day (TID) | ORAL | Status: DC
  Administered 2021-09-28 – 2021-09-29 (×2): 7.5 mg via ORAL
  Filled 2021-09-28 (×3): qty 2

## 2021-09-28 MED ORDER — SODIUM CHLORIDE 0.9 % IV SOLN: 250.0000 mL | INTRAVENOUS | Status: DC

## 2021-09-28 MED ORDER — CHLORHEXIDINE GLUCONATE 0.12 % MT SOLN
OROMUCOSAL | Status: AC
  Administered 2021-09-28: 15 mL via OROMUCOSAL
  Filled 2021-09-28: qty 15

## 2021-09-28 MED ORDER — BUPIVACAINE HCL (PF) 0.5 % IJ SOLN
INTRAMUSCULAR | Status: DC | PRN
  Administered 2021-09-28: 8.5 mL

## 2021-09-28 MED ORDER — FENTANYL CITRATE (PF) 100 MCG/2ML IJ SOLN
INTRAMUSCULAR | Status: AC
  Filled 2021-09-28: qty 2

## 2021-09-28 MED ORDER — SODIUM CHLORIDE 0.9 % IV SOLN
10.0000 mg | INTRAVENOUS | Status: DC
  Administered 2021-09-28: 10 mg via INTRAVENOUS
  Filled 2021-09-28 (×2): qty 1

## 2021-09-28 MED ORDER — BACLOFEN 40 MG/20ML IT SOLN: 80.0000 mg | Freq: Once | INTRATHECAL | Status: DC

## 2021-09-28 MED ORDER — ONDANSETRON HCL 4 MG/2ML IJ SOLN: 4.0000 mg | Freq: Once | INTRAMUSCULAR | Status: DC | PRN

## 2021-09-28 MED ORDER — DEXAMETHASONE SODIUM PHOSPHATE 10 MG/ML IJ SOLN
INTRAMUSCULAR | Status: DC | PRN
  Administered 2021-09-28: 5 mg via INTRAVENOUS

## 2021-09-28 MED ORDER — OXYCODONE HCL 5 MG/5ML PO SOLN
ORAL | Status: AC
  Filled 2021-09-28: qty 5

## 2021-09-28 MED ORDER — POLYVINYL ALCOHOL 1.4 % OP SOLN
1.0000 [drp] | Freq: Three times a day (TID) | OPHTHALMIC | Status: DC | PRN
  Filled 2021-09-28: qty 15

## 2021-09-28 MED ORDER — ONDANSETRON HCL 4 MG/2ML IJ SOLN: 4.0000 mg | Freq: Four times a day (QID) | INTRAMUSCULAR | Status: DC | PRN

## 2021-09-28 MED ORDER — VANCOMYCIN HCL 1000 MG IV SOLR
INTRAVENOUS | Status: DC | PRN
  Administered 2021-09-28: 1000 mg

## 2021-09-28 MED ORDER — POLYETHYLENE GLYCOL 3350 17 G PO PACK
17.0000 g | PACK | Freq: Every day | ORAL | Status: DC
  Administered 2021-09-28 – 2021-09-29 (×2): 17 g via ORAL
  Filled 2021-09-28 (×2): qty 1

## 2021-09-28 MED ORDER — VANCOMYCIN HCL IN DEXTROSE 1-5 GM/200ML-% IV SOLN
1000.0000 mg | INTRAVENOUS | Status: AC
  Administered 2021-09-28: 1000 mg via INTRAVENOUS
  Filled 2021-09-28: qty 200

## 2021-09-28 MED ORDER — ACETAMINOPHEN 650 MG RE SUPP: 650.0000 mg | RECTAL | Status: DC | PRN

## 2021-09-28 MED ORDER — ACETAMINOPHEN 500 MG PO TABS
1000.0000 mg | ORAL_TABLET | Freq: Once | ORAL | Status: AC
  Administered 2021-09-28: 1000 mg via ORAL
  Filled 2021-09-28: qty 2

## 2021-09-28 MED ORDER — THROMBIN 5000 UNITS EX SOLR
CUTANEOUS | Status: AC
  Filled 2021-09-28: qty 10000

## 2021-09-28 MED ORDER — BACLOFEN 20 MG PO TABS
40.0000 mg | ORAL_TABLET | Freq: Four times a day (QID) | ORAL | Status: DC
Start: 2021-09-28 — End: 2021-09-28

## 2021-09-28 MED ORDER — ONDANSETRON HCL 4 MG/2ML IJ SOLN
INTRAMUSCULAR | Status: DC | PRN
  Administered 2021-09-28: 4 mg via INTRAVENOUS

## 2021-09-28 MED ORDER — PHENYLEPHRINE HCL-NACL 20-0.9 MG/250ML-% IV SOLN
INTRAVENOUS | Status: DC | PRN
  Administered 2021-09-28: 40 ug/min via INTRAVENOUS

## 2021-09-28 MED ORDER — FENTANYL CITRATE (PF) 100 MCG/2ML IJ SOLN
25.0000 ug | INTRAMUSCULAR | Status: DC | PRN
  Administered 2021-09-28: 50 ug via INTRAVENOUS

## 2021-09-28 MED ORDER — NON FORMULARY: Freq: Three times a day (TID) | Status: DC

## 2021-09-28 MED ORDER — ROCURONIUM BROMIDE 10 MG/ML (PF) SYRINGE
PREFILLED_SYRINGE | INTRAVENOUS | Status: DC | PRN
  Administered 2021-09-28: 50 mg via INTRAVENOUS
  Administered 2021-09-28: 30 mg via INTRAVENOUS

## 2021-09-28 MED ORDER — LACTATED RINGERS IV SOLN: INTRAVENOUS | Status: DC

## 2021-09-28 MED ORDER — SODIUM CHLORIDE 0.9% FLUSH: 3.0000 mL | Freq: Two times a day (BID) | INTRAVENOUS | Status: DC

## 2021-09-28 MED ORDER — MONTELUKAST SODIUM 10 MG PO TABS
10.0000 mg | ORAL_TABLET | Freq: Every day | ORAL | Status: DC
  Administered 2021-09-29: 10 mg via ORAL
  Filled 2021-09-28: qty 1

## 2021-09-28 MED ORDER — TIZANIDINE HCL 4 MG PO TABS
8.0000 mg | ORAL_TABLET | Freq: Three times a day (TID) | ORAL | Status: DC
  Administered 2021-09-28 – 2021-09-29 (×2): 8 mg via ORAL
  Filled 2021-09-28 (×3): qty 2

## 2021-09-28 MED ORDER — DALFAMPRIDINE ER 10 MG PO TB12
10.0000 mg | ORAL_TABLET | Freq: Two times a day (BID) | ORAL | Status: DC
  Administered 2021-09-29: 10 mg via ORAL
  Filled 2021-09-28 (×3): qty 1

## 2021-09-28 MED ORDER — ACETAMINOPHEN 325 MG PO TABS
650.0000 mg | ORAL_TABLET | ORAL | Status: DC | PRN
  Administered 2021-09-28 – 2021-09-29 (×3): 650 mg via ORAL
  Filled 2021-09-28 (×3): qty 2

## 2021-09-28 MED ORDER — ESTROGENS CONJUGATED 0.625 MG/GM VA CREA
1.0000 | TOPICAL_CREAM | VAGINAL | Status: DC
  Filled 2021-09-28: qty 30

## 2021-09-28 MED ORDER — OXYCODONE HCL 5 MG/5ML PO SOLN
5.0000 mg | Freq: Once | ORAL | Status: AC | PRN
  Administered 2021-09-28: 5 mg via ORAL

## 2021-09-28 MED ORDER — THROMBIN 5000 UNITS EX SOLR
CUTANEOUS | Status: DC | PRN
  Administered 2021-09-28 (×2): 5000 [IU] via TOPICAL

## 2021-09-28 MED ORDER — OXYCODONE HCL 5 MG PO TABS
10.0000 mg | ORAL_TABLET | ORAL | Status: DC | PRN
  Administered 2021-09-28 – 2021-09-29 (×4): 10 mg via ORAL
  Filled 2021-09-28 (×4): qty 2

## 2021-09-28 MED ORDER — PROPOFOL 10 MG/ML IV BOLUS
INTRAVENOUS | Status: DC | PRN
  Administered 2021-09-28: 120 mg via INTRAVENOUS

## 2021-09-28 MED ORDER — DARIFENACIN HYDROBROMIDE ER 7.5 MG PO TB24
7.5000 mg | ORAL_TABLET | Freq: Every day | ORAL | Status: DC
  Administered 2021-09-29: 7.5 mg via ORAL
  Filled 2021-09-28: qty 1

## 2021-09-28 MED ORDER — MENTHOL 3 MG MT LOZG
1.0000 | LOZENGE | OROMUCOSAL | Status: DC | PRN
  Filled 2021-09-28: qty 9

## 2021-09-28 MED ORDER — FOOD THICKENER (SIMPLYTHICK HONEY)
1.0000 | Freq: Three times a day (TID) | ORAL | Status: DC
  Filled 2021-09-28 (×2): qty 1

## 2021-09-28 MED ORDER — VANCOMYCIN HCL 750 MG/150ML IV SOLN
750.0000 mg | Freq: Two times a day (BID) | INTRAVENOUS | Status: AC
  Administered 2021-09-29 (×2): 750 mg via INTRAVENOUS
  Filled 2021-09-28 (×2): qty 150

## 2021-09-28 MED ORDER — 0.9 % SODIUM CHLORIDE (POUR BTL) OPTIME
TOPICAL | Status: DC | PRN
  Administered 2021-09-28: 1000 mL

## 2021-09-28 MED ORDER — VANCOMYCIN HCL 1000 MG IV SOLR
INTRAVENOUS | Status: AC
  Filled 2021-09-28: qty 20

## 2021-09-28 MED ORDER — SODIUM CHLORIDE 0.9% FLUSH: 3.0000 mL | INTRAVENOUS | Status: DC | PRN

## 2021-09-28 MED ORDER — PHENYLEPHRINE 80 MCG/ML (10ML) SYRINGE FOR IV PUSH (FOR BLOOD PRESSURE SUPPORT)
PREFILLED_SYRINGE | INTRAVENOUS | Status: DC | PRN
  Administered 2021-09-28 (×5): 80 ug via INTRAVENOUS

## 2021-09-28 MED ORDER — OXYMETAZOLINE HCL 0.05 % NA SOLN
1.0000 | Freq: Two times a day (BID) | NASAL | Status: DC | PRN
  Filled 2021-09-28: qty 15

## 2021-09-28 MED ORDER — LINACLOTIDE 145 MCG PO CAPS
145.0000 ug | ORAL_CAPSULE | Freq: Every day | ORAL | Status: DC
  Filled 2021-09-28 (×2): qty 1

## 2021-09-28 MED ORDER — ALBUTEROL SULFATE HFA 108 (90 BASE) MCG/ACT IN AERS: 1.0000 | INHALATION_SPRAY | Freq: Four times a day (QID) | RESPIRATORY_TRACT | Status: DC | PRN

## 2021-09-28 MED ORDER — ONDANSETRON HCL 4 MG PO TABS: 4.0000 mg | ORAL_TABLET | Freq: Four times a day (QID) | ORAL | Status: DC | PRN

## 2021-09-28 MED ORDER — CHLORHEXIDINE GLUCONATE CLOTH 2 % EX PADS: 6.0000 | MEDICATED_PAD | Freq: Once | CUTANEOUS | Status: DC

## 2021-09-28 MED ORDER — FENTANYL CITRATE (PF) 250 MCG/5ML IJ SOLN
INTRAMUSCULAR | Status: DC | PRN
  Administered 2021-09-28: 100 ug via INTRAVENOUS
  Administered 2021-09-28: 50 ug via INTRAVENOUS

## 2021-09-28 MED ORDER — MIDAZOLAM HCL 2 MG/2ML IJ SOLN
INTRAMUSCULAR | Status: DC | PRN
  Administered 2021-09-28: 2 mg via INTRAVENOUS

## 2021-09-28 MED ORDER — HYDROMORPHONE HCL 1 MG/ML IJ SOLN: 0.5000 mg | INTRAMUSCULAR | Status: DC | PRN

## 2021-09-28 MED ORDER — ORAL CARE MOUTH RINSE: 15.0000 mL | Freq: Once | OROMUCOSAL | Status: AC

## 2021-09-28 MED ORDER — EPHEDRINE SULFATE-NACL 50-0.9 MG/10ML-% IV SOSY
PREFILLED_SYRINGE | INTRAVENOUS | Status: DC | PRN
  Administered 2021-09-28: 5 mg via INTRAVENOUS

## 2021-09-28 MED ORDER — BACLOFEN 40 MG/20ML IT SOLN
INTRATHECAL | Status: DC | PRN
  Administered 2021-09-28: 80 mg via INTRATHECAL

## 2021-09-28 MED ORDER — LIDOCAINE 2% (20 MG/ML) 5 ML SYRINGE
INTRAMUSCULAR | Status: DC | PRN
Start: 2021-09-28 — End: 2021-09-28
  Administered 2021-09-28: 60 mg via INTRAVENOUS

## 2021-09-28 MED ORDER — FUROSEMIDE 20 MG PO TABS
20.0000 mg | ORAL_TABLET | Freq: Every day | ORAL | Status: DC
  Administered 2021-09-29: 20 mg via ORAL
  Filled 2021-09-28: qty 1

## 2021-09-28 MED ORDER — PHENOL 1.4 % MT LIQD: 1.0000 | OROMUCOSAL | Status: DC | PRN

## 2021-09-28 MED ORDER — ALBUTEROL SULFATE (2.5 MG/3ML) 0.083% IN NEBU: 2.5000 mg | INHALATION_SOLUTION | Freq: Four times a day (QID) | RESPIRATORY_TRACT | Status: DC | PRN

## 2021-09-28 MED ORDER — BUPROPION HCL ER (XL) 300 MG PO TB24
300.0000 mg | ORAL_TABLET | Freq: Every day | ORAL | Status: DC
  Administered 2021-09-29: 300 mg via ORAL
  Filled 2021-09-28: qty 1

## 2021-09-28 MED ORDER — LIDOCAINE-EPINEPHRINE 1 %-1:100000 IJ SOLN
INTRAMUSCULAR | Status: AC
  Filled 2021-09-28: qty 1

## 2021-09-28 MED ORDER — SUGAMMADEX SODIUM 200 MG/2ML IV SOLN
INTRAVENOUS | Status: DC | PRN
  Administered 2021-09-28: 200 mg via INTRAVENOUS

## 2021-09-28 MED ORDER — PSYLLIUM 95 % PO PACK
1.0000 | PACK | Freq: Two times a day (BID) | ORAL | Status: DC
  Administered 2021-09-28 – 2021-09-29 (×2): 1 via ORAL
  Filled 2021-09-28 (×3): qty 1

## 2021-09-28 MED ORDER — FENTANYL CITRATE (PF) 250 MCG/5ML IJ SOLN
INTRAMUSCULAR | Status: AC
  Filled 2021-09-28: qty 5

## 2021-09-28 MED ORDER — LIDOCAINE-EPINEPHRINE 1 %-1:100000 IJ SOLN
INTRAMUSCULAR | Status: DC | PRN
Start: 2021-09-28 — End: 2021-09-28
  Administered 2021-09-28: 8.5 mL

## 2021-09-28 MED ORDER — MIDAZOLAM HCL 2 MG/2ML IJ SOLN
INTRAMUSCULAR | Status: AC
  Filled 2021-09-28: qty 2

## 2021-09-28 MED ORDER — HEMOSTATIC AGENTS (NO CHARGE) OPTIME
TOPICAL | Status: DC | PRN
  Administered 2021-09-28: 1 via TOPICAL

## 2021-09-28 SURGICAL SUPPLY — 75 items
BAG COUNTER SPONGE SURGICOUNT (BAG) ×2 IMPLANT
BINDER ABDOMINAL 12 ML 46-62 (SOFTGOODS) ×1 IMPLANT
BLADE CLIPPER SURG (BLADE) ×2 IMPLANT
BLADE SURG 10 STRL SS (BLADE) ×4 IMPLANT
BLADE SURG 15 STRL LF DISP TIS (BLADE) ×1 IMPLANT
BLADE SURG 15 STRL SS (BLADE) ×1
BOOT SUTURE AID YELLOW STND (SUTURE) ×2 IMPLANT
CABLE BIPOLOR RESECTION CORD (MISCELLANEOUS) ×2 IMPLANT
CANISTER SUCT 3000ML PPV (MISCELLANEOUS) ×2 IMPLANT
CATH ASCENDA 1PIECE (Catheter) ×1 IMPLANT
COVER MAYO STAND STRL (DRAPES) ×2 IMPLANT
DECANTER SPIKE VIAL GLASS SM (MISCELLANEOUS) ×2 IMPLANT
DERMABOND ADVANCED (GAUZE/BANDAGES/DRESSINGS) ×1
DERMABOND ADVANCED .7 DNX12 (GAUZE/BANDAGES/DRESSINGS) ×1 IMPLANT
DRAPE C-ARM 42X72 X-RAY (DRAPES) ×2 IMPLANT
DRAPE INCISE IOBAN 85X60 (DRAPES) ×2 IMPLANT
DRAPE LAPAROTOMY 100X72X124 (DRAPES) ×2 IMPLANT
DRSG OPSITE POSTOP 3X4 (GAUZE/BANDAGES/DRESSINGS) ×1 IMPLANT
DRSG OPSITE POSTOP 4X6 (GAUZE/BANDAGES/DRESSINGS) IMPLANT
DRSG OPSITE POSTOP 4X8 (GAUZE/BANDAGES/DRESSINGS) ×1 IMPLANT
DRSG PAD ABDOMINAL 8X10 ST (GAUZE/BANDAGES/DRESSINGS) IMPLANT
ELECT BLADE INSULATED 4IN (ELECTROSURGICAL) ×2
ELECT COATED BLADE 2.86 ST (ELECTRODE) ×1 IMPLANT
ELECT REM PT RETURN 9FT ADLT (ELECTROSURGICAL) ×2
ELECTRODE BLADE INSULATED 4IN (ELECTROSURGICAL) ×1 IMPLANT
ELECTRODE REM PT RTRN 9FT ADLT (ELECTROSURGICAL) ×1 IMPLANT
GAUZE 4X4 16PLY ~~LOC~~+RFID DBL (SPONGE) ×2 IMPLANT
GAUZE SPONGE 4X4 12PLY STRL (GAUZE/BANDAGES/DRESSINGS) IMPLANT
GLOVE BIO SURGEON STRL SZ8 (GLOVE) ×2 IMPLANT
GLOVE BIOGEL PI IND STRL 7.0 (GLOVE) IMPLANT
GLOVE BIOGEL PI IND STRL 8 (GLOVE) ×1 IMPLANT
GLOVE BIOGEL PI IND STRL 8.5 (GLOVE) ×1 IMPLANT
GLOVE BIOGEL PI INDICATOR 7.0 (GLOVE) ×4
GLOVE BIOGEL PI INDICATOR 8 (GLOVE) ×1
GLOVE BIOGEL PI INDICATOR 8.5 (GLOVE) ×1
GLOVE ECLIPSE 8.0 STRL XLNG CF (GLOVE) ×2 IMPLANT
GLOVE EXAM NITRILE XL STR (GLOVE) IMPLANT
GLOVE SURG ENC MOIS LTX SZ8 (GLOVE) ×2 IMPLANT
GOWN STRL REUS W/ TWL LRG LVL3 (GOWN DISPOSABLE) IMPLANT
GOWN STRL REUS W/ TWL XL LVL3 (GOWN DISPOSABLE) ×1 IMPLANT
GOWN STRL REUS W/TWL 2XL LVL3 (GOWN DISPOSABLE) IMPLANT
GOWN STRL REUS W/TWL LRG LVL3 (GOWN DISPOSABLE)
GOWN STRL REUS W/TWL XL LVL3 (GOWN DISPOSABLE) ×1
KIT BASIN OR (CUSTOM PROCEDURE TRAY) ×2 IMPLANT
KIT REFILL (MISCELLANEOUS) ×1
KIT REFILL CATH SYNCHROMED II (MISCELLANEOUS) IMPLANT
KIT TURNOVER KIT B (KITS) ×2 IMPLANT
NDL HYPO 18GX1.5 BLUNT FILL (NEEDLE) IMPLANT
NDL HYPO 25X1 1.5 SAFETY (NEEDLE) ×1 IMPLANT
NEEDLE HYPO 18GX1.5 BLUNT FILL (NEEDLE) IMPLANT
NEEDLE HYPO 25X1 1.5 SAFETY (NEEDLE) ×2 IMPLANT
NS IRRIG 1000ML POUR BTL (IV SOLUTION) ×2 IMPLANT
PACK EENT II TURBAN DRAPE (CUSTOM PROCEDURE TRAY) ×2 IMPLANT
PASSER CATH 38CM DISP (INSTRUMENTS) ×1 IMPLANT
PATTIES SURGICAL .5 X.5 (GAUZE/BANDAGES/DRESSINGS) IMPLANT
PATTIES SURGICAL 1X1 (DISPOSABLE) IMPLANT
PENCIL BUTTON HOLSTER BLD 10FT (ELECTRODE) ×2 IMPLANT
PUMP SYNCHROMED II 40ML RESVR (Neuro Prosthesis/Implant) ×1 IMPLANT
SPONGE SURGIFOAM ABS GEL SZ50 (HEMOSTASIS) IMPLANT
SPONGE T-LAP 4X18 ~~LOC~~+RFID (SPONGE) ×2 IMPLANT
STAPLER VISISTAT 35W (STAPLE) ×2 IMPLANT
SUT BONE WAX W31G (SUTURE) IMPLANT
SUT PROLENE 3 0 PS 2 (SUTURE) ×2 IMPLANT
SUT SILK 2 0 TIES 10X30 (SUTURE) ×2 IMPLANT
SUT VIC AB 0 CT1 18XCR BRD8 (SUTURE) ×2 IMPLANT
SUT VIC AB 0 CT1 8-18 (SUTURE) ×2
SUT VIC AB 2-0 CP2 18 (SUTURE) ×4 IMPLANT
SUT VIC AB 3-0 SH 8-18 (SUTURE) ×6 IMPLANT
SYR 20CC LL (SYRINGE) ×2 IMPLANT
SYR 3ML LL SCALE MARK (SYRINGE) ×2 IMPLANT
SYR CONTROL 10ML LL (SYRINGE) ×2 IMPLANT
TOWEL GREEN STERILE (TOWEL DISPOSABLE) ×2 IMPLANT
TOWEL GREEN STERILE FF (TOWEL DISPOSABLE) ×2 IMPLANT
TUBE CONNECTING 12X1/4 (SUCTIONS) ×2 IMPLANT
WATER STERILE IRR 1000ML POUR (IV SOLUTION) ×2 IMPLANT

## 2021-09-28 NOTE — Progress Notes (Signed)
Pharmacy Antibiotic Note ? ?Sharon Odom is a 51 y.o. female admitted on 09/28/2021 with surgical prophylaxis.  Pharmacy has been consulted for vanc dosing. ? ?Pt is s/p insertion fo baclofen pump. Vanc was ordered for post procedure prophylaxis only. She got her pre-op dose around 1:30PM today.  ? ?Scr 0.84, wbc wnl ? ?Plan: ?Vanc 750mg  IV q12 x2 doses ?Rx will sign off ? ?Height: 5\' 9"  (175.3 cm) ?Weight: 58.5 kg (129 lb) ?IBW/kg (Calculated) : 66.2 ? ?Temp (24hrs), Avg:97.6 ?F (36.4 ?C), Min:97.2 ?F (36.2 ?C), Max:98 ?F (36.7 ?C) ? ?Recent Labs  ?Lab 09/28/21 ?1309  ?WBC 5.7  ?CREATININE 0.84  ?  ?Estimated Creatinine Clearance: 74.8 mL/min (by C-G formula based on SCr of 0.84 mg/dL).   ? ?Allergies  ?Allergen Reactions  ? Ziconotide Acetate Shortness Of Breath  ?  Anorexia, weird sensations, could not talk, choking feeling, lost since of taste and smell. Hallucinations, vivid dreams. Confusion and sleepiness. N/v, increase in pain.  ?Anorexia, weird sensations, could not talk, choking feeling, lost since of taste and smell. Hallucinations, vivid dreams. Confusion and sleepiness. N/v, increase in pain.   ? Morphine Other (See Comments)  ?  Pt does not recall reaction   ? Amoxicillin Nausea And Vomiting  ? Morphine Sulfate Other (See Comments)  ?  Unknown  ? Pregabalin Nausea And Vomiting  ? ? , PharmD, BCIDP, AAHIVP, CPP ?Infectious Disease Pharmacist ?09/28/2021 6:23 PM ? ? ? ?

## 2021-09-28 NOTE — Transfer of Care (Signed)
Immediate Anesthesia Transfer of Care Note ? ?Patient: Sharon Odom ? ?Procedure(s) Performed: Insertion of baclofen pump, right lower quadrant (Flank) ? ?Patient Location: PACU ? ?Anesthesia Type:General ? ?Level of Consciousness: awake, drowsy and patient cooperative ? ?Airway & Oxygen Therapy: Patient Spontanous Breathing ? ?Post-op Assessment: Report given to RN, Post -op Vital signs reviewed and stable and Patient moving all extremities X 4 ? ?Post vital signs: Reviewed and stable ? ?Last Vitals:  ?Vitals Value Taken Time  ?BP 114/65 09/28/21 1756  ?Temp    ?Pulse 82 09/28/21 1756  ?Resp 20 09/28/21 1757  ?SpO2 97 % 09/28/21 1756  ?Vitals shown include unvalidated device data. ? ?Last Pain:  ?Vitals:  ? 09/28/21 1311  ?TempSrc:   ?PainSc: 0-No pain  ?   ? ?  ? ?Complications: No notable events documented. ?

## 2021-09-28 NOTE — Progress Notes (Addendum)
Patient has Dalfampridine from home but she is not sure if she will stay overnight or if she will go home after the procedure. She verbalized that she must bring this medicine from home any time when she is coming in the hospital because the hospital pharmacy doesn't have it. Patient verbalized that she is aware that if she will stay overnight she must give this medicine to the pharmacy to be dispense by the hospital pharmacy.  ?

## 2021-09-28 NOTE — H&P (Signed)
? ? ?Providing Compassionate, Quality Care - Together ? ?NEUROSURGERY HISTORY & PHYSICAL ? ? ?Sharon Odom is an 51 y.o. female.   ?Chief Complaint: Spasticity ?HPI: This is a 51 year old female with a history of multiple sclerosis with severe spasticity in all 4 extremities.  She unfortunately had an infection in her baclofen pump that was removed in September.  She presents today for reinsertion of baclofen pump in the right lower quadrant.  Her pump is primarily managed by Dr. Dagoberto Ligas. ? ?Past Medical History:  ?Diagnosis Date  ? Allergies   ? Anxiety   ? Arthritis   ? Asthma   ? seasonal - pollen  ? Constipation   ? Depression   ? Dyspnea   ? with exertion  ? GERD (gastroesophageal reflux disease)   ? Headache   ? History of hiatal hernia   ? Left radial nerve palsy 03/15/2020  ? resolved - Left arm/hand weak  ? Multiple sclerosis ([place])   ? Pneumonia 2007  ? x 1  ? Vision abnormalities   ? Wears glasses   ? ? ?Past Surgical History:  ?Procedure Laterality Date  ? EXCISION MORTON'S NEUROMA Right   ? fallopian tube removal    ? INTRATHECAL PUMP IMPLANT Right 02/18/2019  ? Procedure: INTRATHECAL PUMP IMPLANT;  Surgeon: Clydell Hakim, MD;  Location: [facility];  Service: Neurosurgery;  Laterality: Right;  INTRATHECAL PUMP IMPLANT  ? INTRATHECAL PUMP IMPLANT Right 02/17/2021  ? Procedure: Baclofen Pump Replacement;  Surgeon: Erline Levine, MD;  Location: [place];  Service: Neurosurgery;  Laterality: Right;  3C/RM 21  ? PAIN PUMP REMOVAL Left 04/06/2021  ? Procedure: Removal of baclofen pump, Left;  Surgeon: Jakeel Starliper, Theodoro Doing, DO;  Location: [place];  Service: Neurosurgery;  Laterality: Left;  Lateral/Left//3C rm 19  ? UPPER GI ENDOSCOPY    ? WISDOM TOOTH EXTRACTION    ? WISDOM TOOTH EXTRACTION    ? ? ?Family History  ?Problem Relation Age of Onset  ? Diabetes Mother   ? Healthy Brother   ? ?Social History:  reports that she has been smoking cigarettes. She started smoking about 35 years ago. She has a 16.00 pack-year smoking  history. She has never used smokeless tobacco. She reports that she does not currently use alcohol. She reports that she does not use drugs. ? ?Allergies:  ?Allergies  ?Allergen Reactions  ? Ziconotide Acetate Shortness Of Breath  ?  Anorexia, weird sensations, could not talk, choking feeling, lost since of taste and smell. Hallucinations, vivid dreams. Confusion and sleepiness. N/v, increase in pain.  ?Anorexia, weird sensations, could not talk, choking feeling, lost since of taste and smell. Hallucinations, vivid dreams. Confusion and sleepiness. N/v, increase in pain.   ? Morphine Other (See Comments)  ?  Pt does not recall reaction   ? Amoxicillin Nausea And Vomiting  ? Morphine Sulfate Other (See Comments)  ?  Unknown  ? Pregabalin Nausea And Vomiting  ? ? ?Medications Prior to Admission  ?Medication Sig Dispense Refill  ? acetaminophen (TYLENOL) 500 MG tablet Take 1,000 mg by mouth every 6 (six) hours as needed for mild pain (pain).    ? baclofen (LIORESAL) 20 MG tablet Take 2 tablets (40 mg total) by mouth 4 (four) times daily. Since ITB pump out 240 each 5  ? buPROPion (WELLBUTRIN XL) 300 MG 24 hr tablet TAKE 1 TABLET DAILY (Patient taking differently: Take 300 mg by mouth daily.) 90 tablet 3  ? carboxymethylcellulose (REFRESH PLUS) 0.5 % SOLN Place  1 drop into both eyes 3 (three) times daily as needed (dry eyes).    ? Cholecalciferol (VITAMIN D-3) 125 MCG (5000 UT) TABS Take 5,000 Units by mouth daily. 30 tablet 1  ? conjugated estrogens (PREMARIN) vaginal cream Place 1 applicator vaginally 3 (three) times a week.    ? Cyanocobalamin (VITAMIN B-12) 2500 MCG SUBL Place 1 tablet (2,500 mcg total) under the tongue daily. 30 tablet 0  ? dalfampridine 10 MG TB12 Take 1 tablet (10 mg total) by mouth 2 (two) times daily. One po bid 180 tablet 3  ? diazepam (VALIUM) 5 MG tablet Take 1.5 tablets (7.5 mg total) by mouth every 8 (eight) hours. 140 tablet 5  ? furosemide (LASIX) 20 MG tablet Take 20 mg by mouth daily.     ? montelukast (SINGULAIR) 10 MG tablet TAKE 1 TABLET DAILY (Patient taking differently: Take 10 mg by mouth daily after breakfast.) 90 tablet 3  ? Oxycodone HCl 10 MG TABS Take 1 tablet (10 mg total) by mouth every 4 (four) hours as needed. (Patient taking differently: Take 10 mg by mouth every 4 (four) hours as needed (pain).) 120 tablet 0  ? oxymetazoline (AFRIN) 0.05 % nasal spray Place 1 spray into both nostrils 2 (two) times daily as needed for congestion.    ? polyethylene glycol (MIRALAX / GLYCOLAX) 17 g packet Take 17 g by mouth daily.    ? raNITIdine HCl (ZANTAC PO) Take 10 mg by mouth daily.    ? senna-docusate (SENOKOT-S) 8.6-50 MG tablet Take 1 tablet by mouth daily. (Patient taking differently: Take 2 tablets by mouth daily.) 30 tablet 0  ? tiZANidine (ZANAFLEX) 4 MG tablet Take 2 tablets (8 mg total) by mouth 3 (three) times daily. 180 tablet 5  ? trospium (SANCTURA) 20 MG tablet Take 20 mg by mouth 2 (two) times daily.    ? Wheat Dextrin (BENEFIBER) POWD Take 3.5 Scoops by mouth 2 (two) times daily.    ? albuterol (VENTOLIN HFA) 108 (90 Base) MCG/ACT inhaler Inhale 1-2 puffs into the lungs every 6 (six) hours as needed for wheezing or shortness of breath. 6.7 g 1  ? linaclotide (LINZESS) 145 MCG CAPS capsule Take 1 capsule (145 mcg total) by mouth daily before breakfast. In setting of severe constipation in spite of multiple doses of bowels meds- - due to neurologically based constipation made worse by Baclofen and Opiates 30 capsule 5  ? ? ?Results for orders placed or performed during the hospital encounter of 09/28/21 (from the past 48 hour(s))  ?Basic metabolic panel per protocol     Status: None  ? Collection Time: 09/28/21  1:09 PM  ?Result Value Ref Range  ? Sodium 137 135 - 145 mmol/L  ? Potassium 4.1 3.5 - 5.1 mmol/L  ? Chloride 103 98 - 111 mmol/L  ? CO2 25 22 - 32 mmol/L  ? Glucose, Bld 85 70 - 99 mg/dL  ?  Comment: Glucose reference range applies only to samples taken after fasting for  at least 8 hours.  ? BUN 11 6 - 20 mg/dL  ? Creatinine, Ser 0.84 0.44 - 1.00 mg/dL  ? Calcium 9.7 8.9 - 10.3 mg/dL  ? GFR, Estimated >60 >60 mL/min  ?  Comment: (NOTE) ?Calculated using the CKD-EPI Creatinine Equation (2021) ?  ? Anion gap 9 5 - 15  ?  Comment: Performed at [facility], [place] [address], [place], [place] [address]  ?CBC per protocol     Status:  Abnormal  ? Collection Time: 09/28/21  1:09 PM  ?Result Value Ref Range  ? WBC 5.7 4.0 - 10.5 K/uL  ? RBC 4.94 3.87 - 5.11 MIL/uL  ? Hemoglobin 15.5 (H) 12.0 - 15.0 g/dL  ? HCT 46.8 (H) 36.0 - 46.0 %  ? MCV 94.7 80.0 - 100.0 fL  ? MCH 31.4 26.0 - 34.0 pg  ? MCHC 33.1 30.0 - 36.0 g/dL  ? RDW 12.6 11.5 - 15.5 %  ? Platelets 134 (L) 150 - 400 K/uL  ?  Comment: REPEATED TO VERIFY  ? nRBC 0.0 0.0 - 0.2 %  ?  Comment: Performed at [facility], [place] [address] [place], [place] [address]  ? ?No results found. ? ?ROS ?All positives and negatives are listed in HPI above ? ?Blood pressure 131/85, pulse 77, temperature 98 ?F (36.7 ?C), temperature source Oral, resp. rate 18, height 5\' 9"  (1.753 m), weight 58.5 kg, SpO2 98 %. ?Physical Exam  ?Awake alert oriented x3, PERRLA ?Speech fluent and slightly slow, at her baseline ?Moves all extremities antigravity ?Sensory intact to light touch throughout ?Face symmetric ?Bilateral upper and lower extremity spasticity ? ? ?Assessment/Plan ?51 year old female with ? ?Spasticity secondary to MS ? ?-OR today for placement of intrathecal baclofen pump in the right lower quadrant.  We discussed all risks, benefits and expected outcomes and alternatives to treatment including spinal fluid leak, wound infection, need for further surgery, baclofen overdose, heart attack, stroke, death.  Informed consent was obtained. ? ?Thank you for allowing me to participate in this patient's care.  Please do not hesitate to call with questions or concerns. ? ? ?Jenae Tomasello, DO ?Neurosurgeon ?[facility] ?Cell:  [phone] ? ? ? ? ?

## 2021-09-28 NOTE — Op Note (Signed)
? ?  Providing Compassionate, Quality Care - Together ? ?Date of service: 09/28/2021 ? ?PREOP DIAGNOSIS:  ?Spasticity due to multiple sclerosis ? ?POSTOP DIAGNOSIS: Same ? ?PROCEDURE: ?Insertion of baclofen pump, right lower quadrant; Medtronic SynchroMed 2 serial number in [id number] with baclofen 2000 mcg/mL (40cc) ?Intraoperative use of fluoroscopy ? ?SURGEON: Dr. Pieter Partridge C. Leif Loflin, DO ? ?ASSISTANT: Weston Brass, NP ? ?ANESTHESIA: General Endotracheal ? ?EBL: 20 cc ? ?SPECIMENS: None ? ?DRAINS: None ? ?COMPLICATIONS: None 1 ? ?CONDITION: Hemodynamically stable  ? ?HISTORY: ?Sharon Odom is a 51 y.o. female with a history of multiple sclerosis with bilateral upper and lower extremity spasticity.  She unfortunately had a baclofen pump that became infected and removed last year.  She had completed her antibiotic treatment and therefore was ready for repeat intrathecal pump placement.  We discussed all risks, benefits and expected outcomes as well as alternatives to treatment.  Risks included spinal fluid leak, nerve injury, infection, bleeding, need for further surgery, weakness or paralysis, heart attack, stroke, death.  Informed consent was obtained. ? ?PROCEDURE IN DETAIL: ?The patient was brought to the operating room. After induction of general anesthesia, the patient was positioned on the operative table in the lateral left decubitus position with the right side up. All pressure points were meticulously padded. Skin incision was then marked out along the L3-4 and the right lower quadrant of the abdomen and prepped and draped in the usual sterile fashion. Physician driven timeout was performed. ? ?Using a 10 blade, incision was made sharply over the midline lumbar region at L3-4.  Sharp dissection was performed down to the lumbodorsal fascia.  Using a Touhy needle, the L2-3 interspinous space was accessed under lateral fluoroscopy and clear CSF was obtained.  The catheter was then gently passed without any resistance  up to the superior region of T5 under lateral fluoroscopy.  The Touhy needle was removed after the stylette was removed.  The catheter was anchored with the Medtronic anchor and secured with 3-0 Prolene suture.  A pursestring suture including the fascia around the catheter was placed with a 3-0 Prolene suture.  Attention was then turned to creating a pocket in the right lower quadrant.  Using a 10 blade, incision was made sharply.  Using Bovie electrocautery, the subcutaneous fat was dissected to create a large enough pocket for the pump.  Once an appropriate size was created, this was verified to be large enough and able to have appropriate skin closure.  The pump was filled with 40 mL of 2050mcg/mL baclofen on the back table.  The catheter was then tunneled using the provided tunneler to the abdominal incision.  There were spontaneous flow of spinal fluid from the catheter and then this was then connected to the pump.  The extra catheter was then tucked behind the pump and the pump was secured in place with two 3-0 Prolene sutures.  The pump was gently aspirated approximately 0.5 mL of clear spinal fluid.  The wounds were hemostased with bipolar cautery.  Vancomycin was placed in both wounds.  Dermis was closed with 2-0 Vicryl suture and 3-0 Vicryl suture.  Skin was closed with skin glue, sterile dressing was applied. ? ?At the end of the case all sponge, needle, and instrument counts were correct. The patient was then transferred to the stretcher, extubated, and taken to the post-anesthesia care unit in stable hemodynamic condition.  The pump was set at 99.99 mcg/day. ? ? ?

## 2021-09-28 NOTE — Anesthesia Preprocedure Evaluation (Addendum)
Anesthesia Evaluation  ?Patient identified by MRN, date of birth, ID band ?Patient awake ? ? ? ?Reviewed: ?Allergy & Precautions, NPO status , Patient's Chart, lab work & pertinent test results ? ?History of Anesthesia Complications ?Negative for: history of anesthetic complications ? ?Airway ?Mallampati: III ? ?TM Distance: >3 FB ?Neck ROM: Full ? ? ? Dental ? ?(+) Dental Advisory Given ?  ?Pulmonary ?asthma , Current Smoker and Patient abstained from smoking.,  ?  ?Pulmonary exam normal ? ? ? ? ? ? ? Cardiovascular ?negative cardio ROS ?Normal cardiovascular exam ? ? ?  ?Neuro/Psych ? Headaches, PSYCHIATRIC DISORDERS Anxiety Depression  ?Intrathecal baclofen pump ?Spastic hemiparesis ? ? Neuromuscular disease (MS)   ? GI/Hepatic ?hiatal hernia, GERD  Medicated,  ?Endo/Other  ?negative endocrine ROS ? Renal/GU ?negative Renal ROS Bladder dysfunction ? ? ? ?  ?Musculoskeletal ? ?(+) Arthritis , narcotic dependent ? Abdominal ?  ?Peds ? Hematology ?negative hematology ROS ?(+)   ?Anesthesia Other Findings ? ? Reproductive/Obstetrics ? ?  ? ? ? ? ? ? ? ? ? ? ? ? ? ?  ?  ? ? ? ? ? ? ? ?Anesthesia Physical ?Anesthesia Plan ? ?ASA: 3 ? ?Anesthesia Plan: General  ? ?Post-op Pain Management: Tylenol PO (pre-op)*  ? ?Induction: Intravenous ? ?PONV Risk Score and Plan: 2 and Treatment may vary due to age or medical condition, Ondansetron, Midazolam and Dexamethasone ? ?Airway Management Planned: Oral ETT ? ?Additional Equipment: None ? ?Intra-op Plan:  ? ?Post-operative Plan: Extubation in OR ? ?Informed Consent: I have reviewed the patients History and Physical, chart, labs and discussed the procedure including the risks, benefits and alternatives for the proposed anesthesia with the patient or authorized representative who has indicated his/her understanding and acceptance.  ? ? ? ?Dental advisory given ? ?Plan Discussed with: CRNA and Anesthesiologist ? ?Anesthesia Plan Comments:    ? ? ? ? ? ?Anesthesia Quick Evaluation ? ?

## 2021-09-28 NOTE — Anesthesia Procedure Notes (Signed)
Procedure Name: Intubation ?Date/Time: 09/28/2021 3:47 PM ?Performed by: Dorann Lodge, CRNA ?Pre-anesthesia Checklist: Patient identified, Emergency Drugs available, Suction available and Patient being monitored ?Patient Re-evaluated:Patient Re-evaluated prior to induction ?Oxygen Delivery Method: Circle System Utilized ?Preoxygenation: Pre-oxygenation with 100% oxygen ?Induction Type: IV induction ?Ventilation: Mask ventilation without difficulty ?Laryngoscope Size: Mac and 3 ?Grade View: Grade I ?Tube type: Oral ?Tube size: 7.0 mm ?Number of attempts: 1 ?Airway Equipment and Method: Stylet ?Placement Confirmation: ETT inserted through vocal cords under direct vision, positive ETCO2 and breath sounds checked- equal and bilateral ?Secured at: 22 cm ?Tube secured with: Tape ?Dental Injury: Teeth and Oropharynx as per pre-operative assessment  ? ? ? ? ?

## 2021-09-29 ENCOUNTER — Encounter (HOSPITAL_COMMUNITY): Payer: Self-pay | Admitting: Neurological Surgery

## 2021-09-29 DIAGNOSIS — G35 Multiple sclerosis: Secondary | ICD-10-CM | POA: Diagnosis not present

## 2021-09-29 MED ORDER — BACLOFEN 10 MG PO TABS
40.0000 mg | ORAL_TABLET | Freq: Four times a day (QID) | ORAL | Status: DC
Start: 2021-09-29 — End: 2021-09-30
  Administered 2021-09-29: 40 mg via ORAL
  Filled 2021-09-29: qty 4

## 2021-09-29 MED ORDER — BACLOFEN 10 MG PO TABS
40.0000 mg | ORAL_TABLET | Freq: Four times a day (QID) | ORAL | Status: DC
Start: 2021-09-29 — End: 2021-09-29

## 2021-09-29 NOTE — Discharge Summary (Signed)
Physician Discharge Summary  ?Patient ID: ?Sharon Odom ?MRN: [id number] ?DOB/AGE: Jul 10, 1970 51 y.o. ? ?Admit date: 09/28/2021 ?Discharge date: 09/29/2021 ? ?Admission Diagnoses: Spasticity due to multiple sclerosis ? ?Discharge Diagnoses: Spasticity due to multiple sclerosis ?Principal Problem: ?  Spasticity ? ? ?Discharged Condition: good ? ?Hospital Course: The patient was admitted on 09/28/2021 and taken to the operating room where the patient underwent placement of intrathecal baclofen pump in the right lower quadrant. The patient tolerated the procedure well and was taken to the recovery room and then to the floor in stable condition. The hospital course was routine. There were no complications. The wounds remained clean dry and intact. Pt had appropriate incisional soreness. She has increased spasticity in her LLE due to her PO baclofen being held. This was resumed with subseuent reduction of the LLE spasticity . The patient remained afebrile with stable vital signs, and tolerated a regular diet. The patient continued to increase activities, and pain was well controlled with oral pain medications. ? ? ?Consults: None ? ?Significant Diagnostic Studies: radiology: X-Ray: intraoperative ? ? ?Treatments: surgery:  ?Insertion of baclofen pump, right lower quadrant; Medtronic SynchroMed 2 serial number in [id number] with baclofen 2000 mcg/mL (40cc) ?Intraoperative use of fluoroscopy ? ?Discharge Exam: ?Blood pressure 99/61, pulse 73, temperature 99.4 ?F (37.4 ?C), temperature source Oral, resp. rate 18, height 5\' 9"  (1.753 m), weight 58.5 kg, SpO2 100 %. ?Physical Exam: Patient is awake, A/O X 4, conversant, and in good spirits. Eyes open spontaneously. They are in NAD and VSS. Doing well. Speech appropriate but has a slow rate of speech, which is her baseline. MAEW with good strength. Spasticity in her BUE and BLE. Sensation to light touch is intact. PERLA, EOMI. CNs grossly intact. Dressings are clean dry intact.  Incisions are well approximated with no drainage, erythema, or edema. Abdominal binder in place. ? ?Disposition: Discharge disposition: 01-Home or Self Care ? ? ? ? ? ? ?Discharge Instructions   ? ? Incentive spirometry RT   Complete by: As directed ?  ? ?  ? ?Allergies as of 09/29/2021   ? ?   Reactions  ? Ziconotide Acetate Shortness Of Breath  ? Anorexia, weird sensations, could not talk, choking feeling, lost since of taste and smell. Hallucinations, vivid dreams. Confusion and sleepiness. N/v, increase in pain.  ?Anorexia, weird sensations, could not talk, choking feeling, lost since of taste and smell. Hallucinations, vivid dreams. Confusion and sleepiness. N/v, increase in pain.   ? Morphine Other (See Comments)  ? Pt does not recall reaction   ? Amoxicillin Nausea And Vomiting  ? Morphine Sulfate Other (See Comments)  ? Unknown  ? Pregabalin Nausea And Vomiting  ? ?  ? ?  ?Medication List  ?  ? ?TAKE these medications   ? ?acetaminophen 500 MG tablet ?Commonly known as: TYLENOL ?Take 1,000 mg by mouth every 6 (six) hours as needed for mild pain (pain). ?  ?albuterol 108 (90 Base) MCG/ACT inhaler ?Commonly known as: VENTOLIN HFA ?Inhale 1-2 puffs into the lungs every 6 (six) hours as needed for wheezing or shortness of breath. ?  ?baclofen 20 MG tablet ?Commonly known as: LIORESAL ?Take 2 tablets (40 mg total) by mouth 4 (four) times daily. Since ITB pump out ?  ?Benefiber Powd ?Take 3.5 Scoops by mouth 2 (two) times daily. ?  ?buPROPion 300 MG 24 hr tablet ?Commonly known as: WELLBUTRIN XL ?TAKE 1 TABLET DAILY ?  ?carboxymethylcellulose 0.5 % Soln ?Commonly known as: REFRESH PLUS ?Place  1 drop into both eyes 3 (three) times daily as needed (dry eyes). ?  ?conjugated estrogens vaginal cream ?Commonly known as: PREMARIN ?Place 1 applicator vaginally 3 (three) times a week. ?  ?dalfampridine 10 MG Tb12 ?Take 1 tablet (10 mg total) by mouth 2 (two) times daily. One po bid ?  ?diazepam 5 MG tablet ?Commonly known  as: VALIUM ?Take 1.5 tablets (7.5 mg total) by mouth every 8 (eight) hours. ?  ?furosemide 20 MG tablet ?Commonly known as: LASIX ?Take 20 mg by mouth daily. ?  ?linaclotide 145 MCG Caps capsule ?Commonly known as: LINZESS ?Take 1 capsule (145 mcg total) by mouth daily before breakfast. In setting of severe constipation in spite of multiple doses of bowels meds- - due to neurologically based constipation made worse by Baclofen and Opiates ?  ?montelukast 10 MG tablet ?Commonly known as: SINGULAIR ?TAKE 1 TABLET DAILY ?What changed: when to take this ?  ?Oxycodone HCl 10 MG Tabs ?Take 1 tablet (10 mg total) by mouth every 4 (four) hours as needed. ?What changed: reasons to take this ?  ?oxymetazoline 0.05 % nasal spray ?Commonly known as: AFRIN ?Place 1 spray into both nostrils 2 (two) times daily as needed for congestion. ?  ?polyethylene glycol 17 g packet ?Commonly known as: MIRALAX / GLYCOLAX ?Take 17 g by mouth daily. ?  ?senna-docusate 8.6-50 MG tablet ?Commonly known as: Senokot-S ?Take 1 tablet by mouth daily. ?What changed: how much to take ?  ?tiZANidine 4 MG tablet ?Commonly known as: ZANAFLEX ?Take 2 tablets (8 mg total) by mouth 3 (three) times daily. ?  ?trospium 20 MG tablet ?Commonly known as: SANCTURA ?Take 20 mg by mouth 2 (two) times daily. ?  ?Vitamin B-12 2500 MCG Subl ?Place 1 tablet (2,500 mcg total) under the tongue daily. ?  ?Vitamin D-3 125 MCG (5000 UT) Tabs ?Take 5,000 Units by mouth daily. ?  ?ZANTAC PO ?Take 10 mg by mouth daily. ?  ? ?  ? ? Follow-up Information   ? ? [facility] Follow up.   ?Why: The home health agency will contact you for the first home visit. ?Contact information: ?[phone] ? ?  ?  ? ?  ?  ? ?  ? ? ?Signed: ?Marvis Moeller, DNP, NP-C ?09/29/2021, 5:23 PM ? ? ?

## 2021-09-29 NOTE — TOC Initial Note (Signed)
Transition of Care (TOC) - Initial/Assessment Note  ? ? ?Patient Details  ?Name: Sharon Odom ?MRN: [id number] ?Date of Birth: 03/07/1971 ? ?Transition of Care (TOC) CM/SW Contact:    ?Pollie Friar, RN ?Phone Number: ?09/29/2021, 3:18 PM ? ?Clinical Narrative:                 ?Patient is from home with her spouse. Spouse works 7a-5pm. He does provide needed transportation. She manages her own medications and denies any issues.  ?DME at home: wheelchair/ walker/ shower seat ?Patient recently had [facility] for therapies at home. She asked to use them again. CM has provided information to [place] with [facility]. ?TOC following. ? ? ?Expected Discharge Plan: [place] ?Barriers to Discharge: Continued Medical Work up ? ? ?Patient Goals and CMS Choice ?  ?CMS Medicare.gov Compare Post Acute Care list provided to:: Patient ?Choice offered to / list presented to : Patient ? ?Expected Discharge Plan and Services ?Expected Discharge Plan: [place] ?  ?Discharge Planning Services: CM Consult ?Post Acute Care Choice: Home Health ?Living arrangements for the past 2 months: [place] ?                ?  ?  ?  ?  ?  ?HH Arranged: PT, OT, Social Work ?[place] Agency: [facility] ([place]) ?Date HH Agency Contacted: 09/29/21 ?  ?Representative spoke with at [facility]: Ramond Marrow ? ?Prior Living Arrangements/Services ?Living arrangements for the past 2 months: [place] ?Lives with:: Spouse ?Patient language and need for interpreter reviewed:: Yes ?Do you feel safe going back to the place where you live?: Yes      ?  ?  ?  ?Criminal Activity/Legal Involvement Pertinent to Current Situation/Hospitalization: No - Comment as needed ? ?Activities of Daily Living ?Home Assistive Devices/Equipment: Eyeglasses, Wheelchair, Environmental consultant (specify type), Scales, Nebulizer, Grab bars in shower, Raised toilet seat with rails ?ADL Screening (condition at time of admission) ?Patient's cognitive  ability adequate to safely complete daily activities?: Yes ?Is the patient deaf or have difficulty hearing?: No ?Does the patient have difficulty seeing, even when wearing glasses/contacts?: Yes ?Does the patient have difficulty concentrating, remembering, or making decisions?: Yes ?Patient able to express need for assistance with ADLs?: Yes ?Does the patient have difficulty dressing or bathing?: No ?Independently performs ADLs?: Yes (appropriate for developmental age) ?Does the patient have difficulty walking or climbing stairs?: Yes ?Weakness of Legs: Left ?Weakness of Arms/Hands: Left ? ?Permission Sought/Granted ?  ?  ?   ?   ?   ?   ? ?Emotional Assessment ?Appearance:: Appears stated age ?Attitude/Demeanor/Rapport: Engaged ?Affect (typically observed): Accepting ?Orientation: : Oriented to Self, Oriented to Place, Oriented to  Time, Oriented to Situation ?  ?Psych Involvement: No (comment) ? ?Admission diagnosis:  Spasticity [R25.2] ?Patient Active Problem List  ? Diagnosis Date Noted  ? Spasticity 09/28/2021  ? Spastic hemiparesis of left nondominant side due to non-cerebrovascular etiology ([place]) 07/17/2021  ? Relapsing remitting multiple sclerosis ([place]) 07/17/2021  ? Transient hypotension 06/18/2021  ? Mild intermittent asthma 06/08/2021  ? Urinary incontinence 06/08/2021  ? Wound infection after surgery 04/06/2021  ? Mechanical complication of device [id number]  ? Wheelchair dependence 12/16/2020  ? Left arm weakness 07/15/2020  ? Left radial nerve palsy 03/15/2020  ? Hoarseness 12/14/2019  ? Dysarthria 12/14/2019  ? Dysesthesia 09/03/2019  ? Low serum vitamin B12 06/08/2019  ? Low serum vitamin D 06/08/2019  ?  Vitamin deficiency related neuropathy ([place]) 06/08/2019  ? Multiple sclerosis ([place]) 05/13/2019  ? Steroid-induced hyperglycemia   ? Hypoalbuminemia due to protein-calorie malnutrition ([place])   ? Neurogenic bowel   ? Asthma 04/02/2019  ? Constipated 04/02/2019  ? Spell of generalized weakness 03/31/2019   ? Presence of intrathecal baclofen pump 03/19/2019  ? Dysphagia 04/14/2018  ? Spastic diplegia, acquired, lower extremity ([place]) 10/16/2017  ? Muscle spasticity 08/15/2017  ? Gait disturbance 08/15/2017  ? Depression with anxiety 08/15/2017  ? Diplopia 08/15/2017  ? Multiple sclerosis exacerbation ([place]) 03/04/2017  ? High risk medication use 02/06/2017  ? ?PCP:  Oren Section, NP-C ?Pharmacy:   ?[id number] - [place], [place] - [address] ?[place] ?[place] [address] ?Phone: [phone] Fax: [phone] ? ?[place], [place] ?[place] ?[place] [address] ?Phone: [phone] Fax: [phone] ? ?[place], [place] ?[address] ?[place] [address] ?Phone: [phone] Fax: [phone] ? ? ? ? ?Social Determinants of Health (SDOH) Interventions ?  ? ?Readmission Risk Interventions ?   ? View : No data to display.  ?  ?  ?  ? ? ? ?

## 2021-09-29 NOTE — Evaluation (Signed)
Occupational Therapy Evaluation ?Patient Details ?Name: Sharon Odom ?MRN: [id number] ?DOB: [birth year] ?Today's Date: 09/29/2021 ? ? ?History of Present Illness 51 yo female presenting for reinserction of baclofen pump in R lower quadrant. S/p Insertion of baclofen pump on 5/4. Recent infection and removal of prior baclofen pump in September and presenting with severe spestivity at all four extremities. PMH including mustiple sclerosis, arthritis, anxiety, and asthma.  ? ?Clinical Impression ?  ?PTA, pt was living with her husband and performing BADLs, transfers to/from wheelchair, and short distance mobility with rollator. Pt currently requiring Min A for UB ADLs, Mod-Max A for LB ADLs, and Min Guard A for functional transfers to/from w/c. Presenting with decreased strength, ROM, and balance. Pt would benefit from further acute OT to facilitate safe dc. Recommend dc to home with HHOT for further OT to optimize safety, independence with ADLs, and return to PLOF.  ?   ? ?Recommendations for follow up therapy are one component of a multi-disciplinary discharge planning process, led by the attending physician.  Recommendations may be updated based on patient status, additional functional criteria and insurance authorization.  ? ?Follow Up Recommendations ? Home health OT  ?  ?Assistance Recommended at Discharge Intermittent Supervision/Assistance  ?Patient can return home with the following A little help with walking and/or transfers;A lot of help with bathing/dressing/bathroom ? ?  ?Functional Status Assessment ? Patient has had a recent decline in their functional status and demonstrates the ability to make significant improvements in function in a reasonable and predictable amount of time.  ?Equipment Recommendations ? None recommended by OT  ?  ?Recommendations for Other Services PT consult ? ? ?  ?Precautions / Restrictions Precautions ?Precautions: Fall  ? ?  ? ?Mobility Bed Mobility ?Overal bed mobility: Needs  Assistance ?Bed Mobility: Supine to Sit, Sit to Supine ?  ?  ?Supine to sit: Min guard ?Sit to supine: Mod assist, +2 for physical assistance ?  ?General bed mobility comments: Use of leg lifter for getting out of bed. Increased time and Min Guard A. Mod A +2 for return to supine due to fatigue ?  ? ?Transfers ?Overall transfer level: Needs assistance ?Equipment used: None ?Transfers: Bed to chair/wheelchair/BSC ?  ?Stand pivot transfers: Min guard ?  ?  ?  ?  ?General transfer comment: Min Guard A for safety ?  ? ?  ?Balance Overall balance assessment: Needs assistance ?Sitting-balance support: No upper extremity supported, Feet supported ?Sitting balance-Leahy Scale: Fair ?  ?  ?Standing balance support: Bilateral upper extremity supported, During functional activity, Single extremity supported ?Standing balance-Leahy Scale: Poor ?Standing balance comment: reliant on UE support ?  ?  ?  ?  ?  ?  ?  ?  ?  ?  ?  ?   ? ?ADL either performed or assessed with clinical judgement  ? ?ADL Overall ADL's : Needs assistance/impaired ?Eating/Feeding: Set up;Sitting ?  ?Grooming: Set up;Sitting ?  ?Upper Body Bathing: Minimal assistance;Sitting ?  ?Lower Body Bathing: Moderate assistance;Sit to/from stand ?  ?Upper Body Dressing : Minimal assistance;Sitting ?  ?Lower Body Dressing: Maximal assistance;Sit to/from stand ?  ?Toilet Transfer: Min guard;Stand-pivot ?  ?  ?  ?  ?  ?Functional mobility during ADLs: Min guard (stand pivot) ?General ADL Comments: Pt presenting with decreased ROM and strength impacting her functional performance  ? ? ? ?Vision   ?   ?   ?Perception   ?  ?Praxis   ?  ? ?Pertinent Vitals/Pain    ? ? ? ?  Hand Dominance Right ?  ?Extremity/Trunk Assessment Upper Extremity Assessment ?Upper Extremity Assessment: Generalized weakness ?  ?Lower Extremity Assessment ?Lower Extremity Assessment: Generalized weakness ?  ?  ?  ?Communication   ?  ?Cognition Arousal/Alertness: Awake/alert ?Behavior During Therapy:  Flat affect ?Overall Cognitive Status: Within Functional Limits for tasks assessed ?  ?  ?  ?  ?  ?  ?  ?  ?  ?  ?  ?  ?  ?  ?  ?  ?  ?  ?  ?General Comments  VSS ? ?  ?Exercises   ?  ?Shoulder Instructions    ? ? ?Home Living Family/patient expects to be discharged to:: Private residence ?Living Arrangements: Spouse/significant other ?Available Help at Discharge: Family;Available PRN/intermittently ?Type of Home: House ?Home Access: Other (comment) (VPL) ?  ?  ?Home Layout: Two level;Able to live on main level with bedroom/bathroom ?Alternate Level Stairs-Number of Steps: Flight ?  ?Bathroom Shower/Tub: Walk-in shower (roll in shower) ?  ?Bathroom Toilet: Handicapped height ?Bathroom Accessibility: Yes ?  ?Home Equipment: Grab bars - tub/shower;Grab bars - toilet;Rolling Walker (2 wheels);Rollator (4 wheels);Wheelchair - manual;Shower seat - built in;Cane - single point (adjustable bed) ?  ?Additional Comments: spouse works during the day ?  ? ?  ?Prior Functioning/Environment Prior Level of Function : Needs assist ?  ?  ?  ?  ?  ?  ?Mobility Comments: uses wc mainly at home; mobility using rollator reports transfering independently ?ADLs Comments: independent ADLs, assist with IADLs ?  ? ?  ?  ?OT Problem List: Decreased strength;Decreased range of motion;Decreased activity tolerance;Impaired balance (sitting and/or standing) ?  ?   ?OT Treatment/Interventions: Self-care/ADL training;Therapeutic exercise;Energy conservation;DME and/or AE instruction;Therapeutic activities;Patient/family education  ?  ?OT Goals(Current goals can be found in the care plan section) Acute Rehab OT Goals ?Patient Stated Goal: Go home ?OT Goal Formulation: With patient ?Time For Goal Achievement: 10/13/21 ?Potential to Achieve Goals: Good  ?OT Frequency: Min 2X/week ?  ? ?Co-evaluation PT/OT/SLP Co-Evaluation/Treatment: Yes ?Reason for Co-Treatment: For patient/therapist safety;To address functional/ADL transfers ?  ?OT goals addressed  during session: ADL's and self-care ?  ? ?  ?AM-PAC OT "6 Clicks" Daily Activity     ?Outcome Measure Help from another person eating meals?: A Little ?Help from another person taking care of personal grooming?: A Little ?Help from another person toileting, which includes using toliet, bedpan, or urinal?: A Lot ?Help from another person bathing (including washing, rinsing, drying)?: A Lot ?Help from another person to put on and taking off regular upper body clothing?: A Little ?Help from another person to put on and taking off regular lower body clothing?: A Lot ?6 Click Score: 15 ?  ?End of Session Nurse Communication: Mobility status ? ?Activity Tolerance: Patient tolerated treatment well ?Patient left: in bed;with call bell/phone within reach;with bed alarm set ? ?OT Visit Diagnosis: Unsteadiness on feet (R26.81);Other abnormalities of gait and mobility (R26.89);Muscle weakness (generalized) (M62.81)  ?              ?Time: 1100-1140 ?OT Time Calculation (min): 40 min ?Charges:  OT General Charges ?$OT Visit: 1 Visit ?OT Evaluation ?$OT Eval Moderate Complexity: 1 Mod ? ?Jamirra Curnow MSOT, OTR/L ?Acute Rehab ?Pager: [phone] ?Office: [phone] ? ?Jermel Artley M Maylynn Orzechowski ?09/29/2021, 12:39 PM ?

## 2021-09-29 NOTE — Anesthesia Postprocedure Evaluation (Signed)
Anesthesia Post Note ? ?Patient: Sharon Odom ? ?Procedure(s) Performed: Insertion of baclofen pump, right lower quadrant (Flank) ? ?  ? ?Patient location during evaluation: PACU ?Anesthesia Type: General ?Level of consciousness: awake and alert ?Pain management: pain level controlled ?Vital Signs Assessment: post-procedure vital signs reviewed and stable ?Respiratory status: spontaneous breathing, nonlabored ventilation, respiratory function stable and patient connected to nasal cannula oxygen ?Cardiovascular status: blood pressure returned to baseline and stable ?Postop Assessment: no apparent nausea or vomiting ?Anesthetic complications: no ? ? ?No notable events documented. ? ?Last Vitals:  ?Vitals:  ? 09/29/21 0050 09/29/21 0347  ?BP: 112/70 110/64  ?Pulse: 79 68  ?Resp: 18 18  ?Temp: 36.7 ?C 36.6 ?C  ?SpO2: 98% 99%  ?  ?Last Pain:  ?Vitals:  ? 09/29/21 0411  ?TempSrc:   ?PainSc: Asleep  ? ? ?  ?  ?  ?  ?  ?  ? ?March Rummage Cariah Salatino ? ? ? ? ?

## 2021-09-29 NOTE — Plan of Care (Signed)
  Problem: Education: Goal: Knowledge of General Education information will improve Description: Including pain rating scale, medication(s)/side effects and non-pharmacologic comfort measures Outcome: Adequate for Discharge   

## 2021-09-29 NOTE — Evaluation (Signed)
Physical Therapy Evaluation ?Patient Details ?Name: Sharon Odom ?MRN: [id number] ?DOB: 03/05/1971 ?Today's Date: 09/29/2021 ? ?History of Present Illness ? 51 yo female presenting for reinserction of baclofen pump in R lower quadrant. S/p Insertion of baclofen pump on 5/4. Recent infection and removal of prior baclofen pump in September and presenting with severe spestivity at all four extremities. PMH including mustiple sclerosis, arthritis, anxiety, and asthma.  ?Clinical Impression ? Pt is not at baseline functioning, but showed ability to safely complete transfers and basic mobility at min guard level and should be safe at home with her level of available assist.. There are no further acute PT needs.  Will sign off at this time. ? ?   ?   ? ?Recommendations for follow up therapy are one component of a multi-disciplinary discharge planning process, led by the attending physician.  Recommendations may be updated based on patient status, additional functional criteria and insurance authorization. ? ?Follow Up Recommendations Home health PT ? ?  ?Assistance Recommended at Discharge Intermittent Supervision/Assistance  ?Patient can return home with the following ? A little help with walking and/or transfers;A little help with bathing/dressing/bathroom;Assistance with cooking/housework;Help with stairs or ramp for entrance ? ?  ?Equipment Recommendations  (power chair process started)  ?Recommendations for Other Services ?    ?  ?Functional Status Assessment Patient has had a recent decline in their functional status and demonstrates the ability to make significant improvements in function in a reasonable and predictable amount of time.  ? ?  ?Precautions / Restrictions Precautions ?Precautions: Fall  ? ?  ? ?Mobility ? Bed Mobility ?Overal bed mobility: Needs Assistance ?Bed Mobility: Supine to Sit, Sit to Supine ?  ?  ?Supine to sit: Min guard ?Sit to supine: Mod assist, +2 for physical assistance ?  ?General bed  mobility comments: Use of leg lifter for getting out of bed. Increased time and Min Guard A. Mod A +2 for return to supine due to fatigue ?  ? ?Transfers ?Overall transfer level: Needs assistance ?Equipment used: None ?Transfers: Bed to chair/wheelchair/BSC ?  ?Stand pivot transfers: Min guard ?  ?  ?  ?  ?General transfer comment: Min Guard A for safety ?  ? ?Ambulation/Gait ?  ?  ?  ?  ?  ?  ?  ?General Gait Details: pivot transfer to w/c and later back to bed only ? ?Stairs ?  ?  ?  ?  ?  ? ?Wheelchair Mobility ?  ? ?Modified Rankin (Stroke Patients Only) ?  ? ?  ? ?Balance Overall balance assessment: Needs assistance ?Sitting-balance support: No upper extremity supported, Feet supported ?Sitting balance-Leahy Scale: Fair ?  ?  ?Standing balance support: Bilateral upper extremity supported, During functional activity, Single extremity supported ?Standing balance-Leahy Scale: Poor ?Standing balance comment: reliant on UE support ?  ?  ?  ?  ?  ?  ?  ?  ?  ?  ?  ?   ? ? ? ?Pertinent Vitals/Pain    ? ? ?Home Living Family/patient expects to be discharged to:: Private residence ?Living Arrangements: Spouse/significant other ?Available Help at Discharge: Family;Available PRN/intermittently ?Type of Home: House ?Home Access: Other (comment) (VPL) ?  ?  ?Alternate Level Stairs-Number of Steps: Flight ?Home Layout: Two level;Able to live on main level with bedroom/bathroom ?Home Equipment: Grab bars - tub/shower;Grab bars - toilet;Rolling Walker (2 wheels);Rollator (4 wheels);Wheelchair - manual;Shower seat - built in;Cane - single point (adjustable bed) ?Additional Comments: spouse works during the day  ?  ?  Prior Function Prior Level of Function : Needs assist ?  ?  ?  ?  ?  ?  ?Mobility Comments: uses wc mainly at home; mobility using rollator reports transfering independently ?ADLs Comments: independent ADLs, assist with IADLs ?  ? ? ?Hand Dominance  ? Dominant Hand: Right ? ?  ?Extremity/Trunk Assessment  ? Upper  Extremity Assessment ?Upper Extremity Assessment: Generalized weakness ?  ? ?Lower Extremity Assessment ?Lower Extremity Assessment: Generalized weakness ?  ? ?   ?Communication  ?    ?Cognition Arousal/Alertness: Awake/alert ?Behavior During Therapy: Flat affect ?Overall Cognitive Status: Within Functional Limits for tasks assessed ?  ?  ?  ?  ?  ?  ?  ?  ?  ?  ?  ?  ?  ?  ?  ?  ?  ?  ?  ? ?  ?General Comments General comments (skin integrity, edema, etc.): vss ? ?  ?Exercises    ? ?Assessment/Plan  ?  ?PT Assessment All further PT needs can be met in the next venue of care  ?PT Problem List Decreased strength;Decreased activity tolerance;Decreased balance;Decreased mobility;Decreased coordination ? ?   ?  ?PT Treatment Interventions     ? ?PT Goals (Current goals can be found in the Care Plan section)  ?Acute Rehab PT Goals ?Patient Stated Goal: back home, start progressing the pump ?PT Goal Formulation: With patient ? ?  ?Frequency   ?  ? ? ?Co-evaluation PT/OT/SLP Co-Evaluation/Treatment: Yes ?Reason for Co-Treatment: For patient/therapist safety ?PT goals addressed during session: Mobility/safety with mobility ?OT goals addressed during session: ADL's and self-care ?  ? ? ?  ?AM-PAC PT "6 Clicks" Mobility  ?Outcome Measure Help needed turning from your back to your side while in a flat bed without using bedrails?: A Little ?Help needed moving from lying on your back to sitting on the side of a flat bed without using bedrails?: A Little ?Help needed moving to and from a bed to a chair (including a wheelchair)?: A Little ?Help needed standing up from a chair using your arms (e.g., wheelchair or bedside chair)?: A Little ?Help needed to walk in hospital room?: A Little ?Help needed climbing 3-5 steps with a railing? : A Little ?6 Click Score: 18 ? ?  ?End of Session   ?Activity Tolerance: Patient tolerated treatment well ?Patient left: in bed;with call bell/phone within reach ?Nurse Communication: Mobility  status ?PT Visit Diagnosis: Other abnormalities of gait and mobility (R26.89) ?  ? ?Time: 1100-1140 ?PT Time Calculation (min) (ACUTE ONLY): 40 min ? ? ?Charges:   PT Evaluation ?$PT Eval Moderate Complexity: 1 Mod ?PT Treatments ?$Therapeutic Activity: 8-22 mins ?  ?   ? ? ?09/29/2021 ? ?Ginger Carne., PT ?Acute Rehabilitation Services ?[phone]  (pager) ?[phone]  (office) ? ?Tessie Fass Sierah Lacewell ?09/29/2021, 1:00 PM ? ?

## 2021-09-29 NOTE — Progress Notes (Signed)
Subjective: ?Patient reports that she is doing well overall. She notes increased spasticity in her LLE. No acute events overnight.  ? ?Objective: ?Vital signs in last 24 hours: ?Temp:  [97.2 ?F (36.2 ?C)-98.5 ?F (36.9 ?C)] 98.5 ?F (36.9 ?C) (05/05 0800) ?Pulse Rate:  [68-91] 77 (05/05 0800) ?Resp:  [13-22] 20 (05/05 0800) ?BP: (101-131)/(63-107) 101/63 (05/05 0800) ?SpO2:  [95 %-100 %] 100 % (05/05 0800) ?Weight:  [58.5 kg] 58.5 kg (05/04 1249) ? ?Intake/Output from previous day: ?05/04 0701 - 05/05 0700 ?In: 1400.9 [P.O.:537; I.V.:777.8; IV Piggyback:86] ?Out: 1055 [Urine:1050; Blood:5] ?Intake/Output this shift: ?No intake/output data recorded. ? ?Physical Exam: Patient is awake, A/O X 4, conversant, and in good spirits. Eyes open spontaneously. They are in NAD and VSS. Doing well. Speech appropriate but has a slow rate of speech, which is her baseline. MAEW with good strength. Spasticity in her BUE and BLE. Sensation to light touch is intact. PERLA, EOMI. CNs grossly intact. Dressings are clean dry intact. Incisions are well approximated with no drainage, erythema, or edema. Abdominal binder in place. ? ? ? ?Lab Results: ?Recent Labs  ?  09/28/21 ?1309  ?WBC 5.7  ?HGB 15.5*  ?HCT 46.8*  ?PLT 134*  ? ?BMET ?Recent Labs  ?  09/28/21 ?1309  ?NA 137  ?K 4.1  ?CL 103  ?CO2 25  ?GLUCOSE 85  ?BUN 11  ?CREATININE 0.84  ?CALCIUM 9.7  ? ? ?Studies/Results: ?DG THORACOLUMABAR SPINE ? ?Result Date: 09/28/2021 ?CLINICAL DATA:  Surgery: Insertion of baclofen pump in the right lower quadrant. EXAM: THORACOLUMBAR SPINE 1V COMPARISON:  None Available. FINDINGS: Images were performed intraoperatively without the presence of a radiologist. There are 2 sagittal images of the thoracic spine. On the first image, a needle overlies a neural foramen presumably within the lumbar spine. Total fluoroscopy images: 2 Total fluoroscopy time: 32 seconds Total dose: Radiation Exposure Index (as provided by the fluoroscopic device): 8.83 mGy air  Kerma Please see intraoperative findings for further detail. IMPRESSION: Operative fluoroscopy for baclofen pump placement. Electronically Signed   By: Yvonne Kendall M.D.   On: 09/28/2021 17:29  ? ?DG C-Arm 1-60 Min-No Report ? ?Result Date: 09/28/2021 ?Fluoroscopy was utilized by the requesting physician.  No radiographic interpretation.  ? ?DG C-Arm 1-60 Min-No Report ? ?Result Date: 09/28/2021 ?Fluoroscopy was utilized by the requesting physician.  No radiographic interpretation.   ? ?Assessment/Plan: ?Patient is post-op day 1 s/p Insertion of baclofen pump, right lower quadrant. She is recovering well. She notes increased spasticity in her LLE. It appears that her PO baclofen dose was held last night and this morning as per pharmacy. We will resume her PO baclofen as per Dr. Imelda Pillow recommendation. Continue abdominal binder. Continue working on pain control, mobility and ambulating patient. Will plan for discharge today.  ? ? LOS: 0 days  ? ? ? ?Marvis Moeller, DNP, NP-C ?09/29/2021, 9:13 AM ? ? ? ? ?

## 2021-10-04 ENCOUNTER — Encounter: Payer: Self-pay | Admitting: Physical Medicine and Rehabilitation

## 2021-10-06 ENCOUNTER — Encounter: Payer: Self-pay | Admitting: Physical Medicine and Rehabilitation

## 2021-10-10 ENCOUNTER — Telehealth: Payer: Self-pay

## 2021-10-10 NOTE — Telephone Encounter (Signed)
Jazelyn Sipe ([employer]) - [id number] ?Linzess capsules ?Status: PA Response - Denied ?Created: April 28th, 2023 [phone] ?Sent: May 1st, 2023 ?Open  Archived - Outcome: Denied ?

## 2021-10-12 ENCOUNTER — Ambulatory Visit (INDEPENDENT_AMBULATORY_CARE_PROVIDER_SITE_OTHER): Payer: BLUE CROSS/BLUE SHIELD | Admitting: Neurology

## 2021-10-12 ENCOUNTER — Other Ambulatory Visit: Payer: Self-pay | Admitting: Physical Medicine and Rehabilitation

## 2021-10-12 VITALS — BP 113/76 | HR 72

## 2021-10-12 DIAGNOSIS — G35 Multiple sclerosis: Secondary | ICD-10-CM | POA: Diagnosis not present

## 2021-10-12 DIAGNOSIS — G8114 Spastic hemiplegia affecting left nondominant side: Secondary | ICD-10-CM

## 2021-10-12 DIAGNOSIS — R252 Cramp and spasm: Secondary | ICD-10-CM

## 2021-10-12 NOTE — Progress Notes (Signed)
PATIENT: Sharon Odom DOB: [birth year]  Chief Complaint  Patient presents with   Procedure    Room 15,Botox appt Pt states botox was not helpful, would like to discuss more today, states spasms are worse in leg now,      HISTORICAL   Sharon Odom is a 51 years old female, accompanied by her husband, referred by Dr. Felecia Shelling for evaluation of botulism toxin injection for spasticity due to multiple sclerosis, this is the first time I evaluated her, also performed injection today on April 18, 2018.  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  She received Lemtrada infusion in April 2017, second dose April 2018, due to continued mild progression of her symptoms, she complete her third Lemtrada infusion in October 2019.  She has been complaining of bilateral lower extremity, left upper extremity spasticity since late 2017, gradually getting worse, has tried different medications, including baclofen up to 120 mg daily, with limited benefit, she is not taking baclofen 80 mg, previously also tried and failed tizanidine, Trileptal, lower dose of botulism toxin a injection in the past only mild temporarily improvement.  She did not ambulate with walker at home, wheelchair for longer distance, complains painful frequent left lower extremity spasm, forceful left lower extremity extension, when she bear weight, she has left toes forceful painful flexion grabbing on the floor, also left shoulder, arm spasm, difficulty release left hand  grip.  She was recently started on Valium 5 mg every 6 hours, which has been helpful.  I personally reviewed the MRI of the brain and cervical spine in September 2019, the MRI of the brain shows multiple T2/FLAIR hyperintense foci in brainstem, cerebellum, left thalamus, hemisphere, consistent with typical chronic demyelinating plaque, no enhancement. MRI of cervical spine showed small T2/flair hyperintensity foci posterior to the left C3-4, several 45 also noted within the brainstem, multilevel mild degenerative changes.  No significant canal stenosis.   She also complains of urinary urgency, frequency, is taking Myrbetriq 50 mg, Wellbutrin 300 mg for depression, mild swallowing difficulty, swallowing evaluation is pending  UPDATE August 06 2018: She had first injection on April 18, 2018, we used Botox a 600 units, 400 units into spastic left lower extremity, 200 into left upper extremity,  Within 3 days, she notice weakness of left 1, 2 fingers, lasted for few weeks, she has difficulty using her left hand, but her left bicep pains has much improved,it also helped her left calf muscle spams.  She noticed increased left leg and shoulder muscle spasm since Jan 10.  UPDATE November 05 2018: She responded well to previous injection, reported 80% improvement of her left thigh muscle spasm, 50% improvement of her left shoulder, proximal arm muscle spasm, but the benefit of short lasting, she continue have bilateral toe flexion, painful, left worse than right, there was no improvement in her left calf muscle spasm, and pain.  UPDATE Sept 14 2020: She responded well to previous injection to left shoulder, but recently she complains of worsening left shoulder pain, is planning on to have evaluation  and x-ray by her primary care physician, she denies significant improvement to the spasticity of her left lower extremity, was recently evaluated by Dr. Maryjean Ka, planning on to have baclofen placement, per  patient, low-dose baclofen intrathecal trial produced miracles,  UPDATE May 14 2019: She reported significant improvement with botulism toxin injection to her left shoulder, mild improvement to spasticity left lower extremity  UPDATE August 17 2019: She responded well to previous injection, left shoulder, left calf muscle spasm has much improved, she received baclofen pump by Dr. Maryjean Ka in September 2020, is going through dose adjustment,  Update November 25, 2019: Baclofen pump that was placed by Dr. Maryjean Ka in September 2020 has been helpful, especially for her lower extremity, today she just want Botox injection for her spastic left upper extremity, complains of left shoulder pain, left hand finger flexion  UPDATE Jul 12 2020: I personally reviewed MRI of the brain with without contrast in November 2021: Multiple T2/FLAIR hyperintensity foci in the brainstem, cerebellum, thalamus, hemisphere, consistent with chronic demyelinating plaque, no new lesions, no contrast-enhancement MRI of cervical spine with without contrast, hazy upper cervical cord, multiple T2 hyperintensity foci within the spinal cord at cervical medullary junction, C2-3, C3-4, C4 and C6, no contrast-enhancement, no change compared to previous scan November 2020  Also reviewed previous evaluation by Dr. Felecia Shelling, [facility] admission for delusion and hallucinations, baclofen, has really helped her spasticity, occasionally tonic spasms can be severe, more frequent tonic spasming feet, mildly increased tone in legs, difficulty lifting left leg,  She woke up October 6 noticed left wrist drop, left arm pain, and hand numbness, at baseline her left arm is weaker than right,  This has led to EMG nerve conduction study on April 19, 2020, showed normal left radial, median, ulnar sensory response.  But there is evidence of spontaneous activity at left extensor digitorum communis, extensor index proprius, wrist the possibility of  left radial neuropathy,  She is here to evaluate potential Botox injection for her reported left shoulder tightness,  UPDATE Jul 13 2021: She stopped the Botox injection for left shoulder tightness for a while, reported to improvement with baclofen pump, unfortunately she suffered infection around the pump, has to be taking out, now noticed increased left shoulder tightness, and also left lower extremity spasticity, left knee extension,  Return for Botox injection for spastic left upper and lower extremity  UPDATE Oct 12 2021: She is accompanied by her husband at today's visit, complains of worsening bilateral lower extremity spasticity, she had a baclofen pump, malfunction, have to redo, is on the titration dose of baclofen, but is much lower dose than previous, now she complains of increased bilateral lower extremity spasticity, especially left leg, sometimes difficulty bending her left knee,  Last injection to left upper extremity 3 months ago has been helpful,  PHYSICAL EXAM   Vitals:   10/12/21 1336  BP: 113/76  Pulse: 72    There is no height or weight on file to calculate BMI.   Drowsy, in mild distress, slurred speech, oriented to history taking and casual conversation.  MOTOR: right upper extremity motor strength is relatively preserved, she is able to raise left arm against gravity, mild to moderate left upper extremity proximal and distal muscle weakness, no significant spasticity,  Antigravity movement of bilateral lower extremity, weaker on the left side, tendency for left knee extension,    ASSESSMENT AND PLAN  Emersen Kelsay is a 51 y.o. female    Relapsing Remitting Multiple Sclerosis, left  spasticity hemiparesis  Status post baclofen pump placement by Dr. Lovenia Shuck in September 2020, which has helped her lower extremity spasticity, unfortunately she has suffered a pump infection, has to be taken out in 2022, now she had new baclofen pump, is on titration dose of  intrathecal baclofen, complains of increased lower extremity spasticity  She desires injection of left lower extremity  Used Botox a 300 units under electrical stimulation guidance  Left vastus lateralis 100 units Left vastus medialis 100 units Left rectus femoris 100 units     Marcial Pacas, M.D. Ph.D.  [facility] [address], [place], [place] [address] Ph: [phone] Fax: [phone]  CC: Referring Provider

## 2021-10-12 NOTE — Progress Notes (Signed)
Botox 100 units x 3 units Lot -[id number] EXP: 10/23 [id number] S/P

## 2021-10-17 ENCOUNTER — Other Ambulatory Visit: Payer: Self-pay | Admitting: Physical Medicine and Rehabilitation

## 2021-10-17 MED ORDER — OXYCODONE HCL 10 MG PO TABS: 10.0000 mg | ORAL_TABLET | ORAL | 0 refills | Status: DC | PRN

## 2021-10-24 ENCOUNTER — Other Ambulatory Visit: Payer: Self-pay

## 2021-10-24 MED ORDER — BACLOFEN 20 MG PO TABS
40.0000 mg | ORAL_TABLET | Freq: Four times a day (QID) | ORAL | 3 refills | Status: DC
Start: 2021-10-24 — End: 2022-03-27

## 2021-10-24 NOTE — Telephone Encounter (Signed)
[facility] is sending a request for a 90 day supply of Baclofen

## 2021-10-30 ENCOUNTER — Encounter
Payer: BLUE CROSS/BLUE SHIELD | Attending: Physical Medicine and Rehabilitation | Admitting: Physical Medicine and Rehabilitation

## 2021-10-30 ENCOUNTER — Encounter: Payer: Self-pay | Admitting: Physical Medicine and Rehabilitation

## 2021-10-30 VITALS — BP 115/74 | HR 88 | Ht 69.0 in | Wt 130.0 lb

## 2021-10-30 DIAGNOSIS — Z978 Presence of other specified devices: Secondary | ICD-10-CM | POA: Diagnosis present

## 2021-10-30 DIAGNOSIS — G8114 Spastic hemiplegia affecting left nondominant side: Secondary | ICD-10-CM | POA: Diagnosis present

## 2021-10-30 DIAGNOSIS — R252 Cramp and spasm: Secondary | ICD-10-CM | POA: Diagnosis present

## 2021-10-30 DIAGNOSIS — G35 Multiple sclerosis: Secondary | ICD-10-CM | POA: Diagnosis present

## 2021-10-30 DIAGNOSIS — Z993 Dependence on wheelchair: Secondary | ICD-10-CM

## 2021-10-30 NOTE — Patient Instructions (Addendum)
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 Hoarseness AND dysarthria; not just 1 issue. Insomnia and ITB refills  ITB pump removed due to pump infection 11/10 by Dr Reatha Armour- here for f/u on spasticity and possible MS flare.    Attempted to interrogate ITB pump for 15 minutes- changed batteries. No results- is at 195 mcg/day.   2.  Will continue to increase ITB pump by 10% at each visit- by [facility] and can do 2x/week at max- she's going out of town to [place] 6/16- so wants to be not too loose for the trip. So suggest only titrate up 1x/this week.    3. D/w Darius Bump from [facility] about plans.   4. We attempted to get Linzess but was denied by insurance. Really needs it due to bloating/constipation.   5. Has appointment q month til end of year.  F/u q month for ITB pump/spasticity  6. Won't decrease PO oral meds yet for spasticity  7. Current manual w/c- isn't working, because she has very little movement of LUE and no function on LLE.  Current w/c is less than 41 years old- but has had a large functional change in her status, so cannot push manual w/c anymore. Wrote Rx for power w/c evaluation.  Pt has severe MS with secondary progressive MS and now LUE/LLE and RLE not working well anymore.   [id number]- [facility] w/c

## 2021-10-30 NOTE — Progress Notes (Signed)
Subjective:    Patient ID: Sharon Odom, female    DOB: 10/04/1970, 51 y.o.   MRN: [id number]  HPI  Patient is a 51 yr old female with secondary progressive MS with L>R spasticity s/p ITB pump Here for f/u. Hoarseness AND dysarthria; not just 1 issue. Insomnia and ITB refills  ITB pump removed due to pump infection 11/10 by Dr Reatha Armour- here for f/u on spasticity.  Couldn't walk before went to [place] Couldn't get in/out of car.  Had to require 2-3 people to get to bathroom.  Because tone sounds like too loose.   Feels like touching her skin/is like wearing pantyhose.  On RLE-  Is now affected in RLE, when hadn't been in past-  Per husband, was affected once in 2020-  Was really tight right after they put in ITB pump last time.   Has fallen- 2 weeks ago-  just bruises.   Felt like was stuck to the floor- sounds like was too loose.  When first titrated up on ITB pump.   Last titrated up on ITB pump by [facility] early last week.  Went to get Botox- didn't get in arm, hand or calf- did do in thigh- Dr Krista Blue- Neurology.   Still having pooping issues- last BM was Thursday/Friday- had incontinence- not formed at all.   Still taking miralax and some times double dose- I.e. BID 3 senna on day she titrates up the pump.  Still doing benefiber.    Pain Inventory Average Pain 5 Pain Right Now 7 My pain is constant, sharp, burning, tingling, and aching  LOCATION OF PAIN  hand, fingers, knee, leg, ankle, toes  BOWEL Number of stools per week: 4-5 Oral laxative use Yes  Type of laxative miralax, senokot, benefiber Enema or suppository use No  History of colostomy No  Incontinent No   BLADDER Normal In and out cath, frequency . Able to self cath  . Bladder incontinence Yes  Frequent urination No  Leakage with coughing No  Difficulty starting stream Yes  Incomplete bladder emptying No    Mobility use a walker how many minutes can you walk? 0-3 ability to climb steps?  no do you  drive?  no use a wheelchair needs help with transfers  Function disabled: date disabled 02/2012 I need assistance with the following:  dressing, meal prep, household duties, and shopping  Neuro/Psych bladder control problems bowel control problems weakness numbness tingling trouble walking spasms dizziness depression  Prior Studies Any changes since last visit?  no  Physicians involved in your care Any changes since last visit?  no   Family History  Problem Relation Age of Onset   Diabetes Mother    Healthy Brother    Social History   Socioeconomic History   Marital status: Married    Spouse name: Not on file   Number of children: Not on file   Years of education: Not on file   Highest education level: Not on file  Occupational History   Not on file  Tobacco Use   Smoking status: Every Day    Packs/day: 0.50    Years: 32.00    Pack years: 16.00    Types: Cigarettes    Start date: 1988   Smokeless tobacco: Never  Vaping Use   Vaping Use: Former   Quit date: 05/29/2011  Substance and Sexual Activity   Alcohol use: Not Currently    Comment: 2-3 times per wk   Drug use: Never   Sexual activity:  Not Currently    Birth control/protection: Post-menopausal  Other Topics Concern   Not on file  Social History Narrative   Right handed    Caffeine: 1 cup or less per day- coffee   [employer]   Social Determinants of Health   Financial Resource Strain: Not on file  Food Insecurity: Not on file  Transportation Needs: Not on file  Physical Activity: Not on file  Stress: Not on file  Social Connections: Not on file   Past Surgical History:  Procedure Laterality Date   EXCISION MORTON'S NEUROMA Right    fallopian tube removal     INTRATHECAL PUMP IMPLANT Right 02/18/2019   Procedure: INTRATHECAL PUMP IMPLANT;  Surgeon: Clydell Hakim, MD;  Location: [place];  Service: Neurosurgery;  Laterality: Right;  INTRATHECAL PUMP IMPLANT   INTRATHECAL PUMP IMPLANT Right  02/17/2021   Procedure: Baclofen Pump Replacement;  Surgeon: Erline Levine, MD;  Location: [place];  Service: Neurosurgery;  Laterality: Right;  3C/RM 21   PAIN PUMP IMPLANTATION N/A 09/28/2021   Procedure: Insertion of baclofen pump, right lower quadrant;  Surgeon: Dawley, Theodoro Doing, DO;  Location: [place];  Service: Neurosurgery;  Laterality: N/A;   PAIN PUMP REMOVAL Left 04/06/2021   Procedure: Removal of baclofen pump, Left;  Surgeon: Dawley, Theodoro Doing, DO;  Location: [place];  Service: Neurosurgery;  Laterality: Left;  Lateral/Left//3C rm 19   UPPER GI ENDOSCOPY     WISDOM TOOTH EXTRACTION     WISDOM TOOTH EXTRACTION     Past Medical History:  Diagnosis Date   Allergies    Anxiety    Arthritis    Asthma    seasonal - pollen   Constipation    Depression    Dyspnea    with exertion   GERD (gastroesophageal reflux disease)    Headache    History of hiatal hernia    Left radial nerve palsy 03/15/2020   resolved - Left arm/hand weak   Multiple sclerosis ([place])    Pneumonia 2007   x 1   Vision abnormalities    Wears glasses    BP 115/74   Pulse 88   Ht 5\' 9"  (1.753 m)   Wt 130 lb (59 kg)   SpO2 97%   BMI 19.20 kg/m   Opioid Risk Score:   Fall Risk Score:  `1  Depression screen PHQ 2/9     09/22/2021    1:48 PM 06/16/2021    1:43 PM 04/17/2021    2:04 PM 12/21/2020   12:07 PM 08/08/2020    3:30 PM 02/08/2020    2:10 PM 12/14/2019    3:07 PM  Depression screen PHQ 2/9  Decreased Interest 1 0 0 0 1 1 0  Down, Depressed, Hopeless 1 0 0 0 1 1 0  PHQ - 2 Score 2 0 0 0 2 2 0      Review of Systems  Musculoskeletal:  Positive for gait problem.  Neurological:  Positive for dizziness, weakness and numbness.  Psychiatric/Behavioral:  Positive for dysphoric mood.   All other systems reviewed and are negative.     Objective:   Physical Exam  Awake, alert, gravely hoarse voice; in manual w/c; accompanied by husband, NAD MAS of 3-4 in LLE- esp in L knee and hip Extensor tone as  well with LE's with ROM.  Wearing Bioness on LLE- using at level 9 Since cannot feel it.       Assessment & Plan:   Patient is a  51 yr old female with secondary progressive MS with L>R spasticity s/p ITB pump Here for f/u. Hoarseness AND dysarthria; not just 1 issue. Insomnia and ITB refills  ITB pump removed due to pump infection 11/10 by Dr Reatha Armour- here for f/u on spasticity and possible MS flare.    Attempted to interrogate ITB pump for 15 minutes- changed batteries. No results- is at 195 mcg/day.   2.  Will continue to increase ITB pump by 10% at each visit- by [facility] and can do 2x/week at max- she's going out of town to [place] 6/16- so wants to be not too loose for the trip. So suggest only titrate up 1x/this week.    3. D/w Darius Bump from [facility] about plans.   4. We attempted to get Linzess but was denied by insurance. Really needs it due to bloating/constipation.   5. Has appointment q month til end of year.  F/u q month for ITB pump/spasticity  6. Won't decrease PO oral meds yet for spasticity  7. Current manual w/c- isn't working, because she has very little movement of LUE and no function on LLE.  Current w/c is less than 67 years old- but has had a large functional change in her status, so cannot push manual w/c anymore. Wrote Rx for power w/c evaluation.  Pt has severe MS with secondary progressive MS and now LUE/LLE and RLE not working well anymore.   [id number]- [facility] w/c  I spent a total of 50   minutes on total care today- >50% coordination of care- due to trying to titrate pump; as well as calling [facility] from room.

## 2021-11-06 ENCOUNTER — Encounter: Payer: Self-pay | Admitting: Neurology

## 2021-11-08 ENCOUNTER — Telehealth: Payer: Self-pay

## 2021-11-08 ENCOUNTER — Encounter: Payer: Self-pay | Admitting: Neurology

## 2021-11-08 ENCOUNTER — Telehealth: Payer: Self-pay | Admitting: *Deleted

## 2021-11-08 ENCOUNTER — Inpatient Hospital Stay (HOSPITAL_COMMUNITY)
Admission: EM | Admit: 2021-11-08 | Discharge: 2021-11-13 | DRG: 813 | Disposition: A | Discharge status: Home Health | Payer: BLUE CROSS/BLUE SHIELD | Attending: Internal Medicine | Admitting: Internal Medicine

## 2021-11-08 ENCOUNTER — Other Ambulatory Visit: Payer: Self-pay

## 2021-11-08 ENCOUNTER — Telehealth: Payer: Self-pay | Admitting: Neurology

## 2021-11-08 ENCOUNTER — Encounter (HOSPITAL_COMMUNITY): Payer: Self-pay | Admitting: Emergency Medicine

## 2021-11-08 DIAGNOSIS — G629 Polyneuropathy, unspecified: Secondary | ICD-10-CM | POA: Diagnosis present

## 2021-11-08 DIAGNOSIS — J45909 Unspecified asthma, uncomplicated: Secondary | ICD-10-CM | POA: Diagnosis present

## 2021-11-08 DIAGNOSIS — F418 Other specified anxiety disorders: Secondary | ICD-10-CM | POA: Diagnosis present

## 2021-11-08 DIAGNOSIS — T50995A Adverse effect of other drugs, medicaments and biological substances, initial encounter: Secondary | ICD-10-CM | POA: Diagnosis present

## 2021-11-08 DIAGNOSIS — D696 Thrombocytopenia, unspecified: Principal | ICD-10-CM

## 2021-11-08 DIAGNOSIS — Z9079 Acquired absence of other genital organ(s): Secondary | ICD-10-CM

## 2021-11-08 DIAGNOSIS — G894 Chronic pain syndrome: Secondary | ICD-10-CM | POA: Diagnosis present

## 2021-11-08 DIAGNOSIS — K219 Gastro-esophageal reflux disease without esophagitis: Secondary | ICD-10-CM | POA: Diagnosis present

## 2021-11-08 DIAGNOSIS — F419 Anxiety disorder, unspecified: Secondary | ICD-10-CM | POA: Diagnosis present

## 2021-11-08 DIAGNOSIS — H539 Unspecified visual disturbance: Secondary | ICD-10-CM | POA: Diagnosis present

## 2021-11-08 DIAGNOSIS — R32 Unspecified urinary incontinence: Secondary | ICD-10-CM | POA: Diagnosis present

## 2021-11-08 DIAGNOSIS — Z79890 Hormone replacement therapy (postmenopausal): Secondary | ICD-10-CM

## 2021-11-08 DIAGNOSIS — G35 Multiple sclerosis: Secondary | ICD-10-CM | POA: Diagnosis present

## 2021-11-08 DIAGNOSIS — J302 Other seasonal allergic rhinitis: Secondary | ICD-10-CM | POA: Diagnosis present

## 2021-11-08 DIAGNOSIS — Z888 Allergy status to other drugs, medicaments and biological substances status: Secondary | ICD-10-CM

## 2021-11-08 DIAGNOSIS — Z885 Allergy status to narcotic agent status: Secondary | ICD-10-CM

## 2021-11-08 DIAGNOSIS — Z881 Allergy status to other antibiotic agents status: Secondary | ICD-10-CM

## 2021-11-08 DIAGNOSIS — Z9689 Presence of other specified functional implants: Secondary | ICD-10-CM | POA: Diagnosis present

## 2021-11-08 DIAGNOSIS — F32A Depression, unspecified: Secondary | ICD-10-CM | POA: Diagnosis present

## 2021-11-08 DIAGNOSIS — D6959 Other secondary thrombocytopenia: Principal | ICD-10-CM | POA: Diagnosis present

## 2021-11-08 DIAGNOSIS — M1990 Unspecified osteoarthritis, unspecified site: Secondary | ICD-10-CM | POA: Diagnosis present

## 2021-11-08 DIAGNOSIS — Z79899 Other long term (current) drug therapy: Secondary | ICD-10-CM

## 2021-11-08 DIAGNOSIS — D693 Immune thrombocytopenic purpura: Secondary | ICD-10-CM | POA: Diagnosis present

## 2021-11-08 DIAGNOSIS — F17210 Nicotine dependence, cigarettes, uncomplicated: Secondary | ICD-10-CM | POA: Diagnosis present

## 2021-11-08 DIAGNOSIS — D649 Anemia, unspecified: Secondary | ICD-10-CM | POA: Diagnosis present

## 2021-11-08 LAB — DIFFERENTIAL
Abs Immature Granulocytes: 0.06 10*3/uL (ref 0.00–0.07)
Basophils Absolute: 0 10*3/uL (ref 0.0–0.1)
Basophils Relative: 0 %
Eosinophils Absolute: 0.2 10*3/uL (ref 0.0–0.5)
Eosinophils Relative: 4 %
Immature Granulocytes: 1 %
Lymphocytes Relative: 27 %
Lymphs Abs: 1.4 10*3/uL (ref 0.7–4.0)
Monocytes Absolute: 0.3 10*3/uL (ref 0.1–1.0)
Monocytes Relative: 6 %
Neutro Abs: 3.3 10*3/uL (ref 1.7–7.7)
Neutrophils Relative %: 62 %

## 2021-11-08 LAB — CBC
HCT: 35.7 % — ABNORMAL LOW (ref 36.0–46.0)
Hemoglobin: 11.8 g/dL — ABNORMAL LOW (ref 12.0–15.0)
MCH: 32 pg (ref 26.0–34.0)
MCHC: 33.1 g/dL (ref 30.0–36.0)
MCV: 96.7 fL (ref 80.0–100.0)
Platelets: <5 10*3/uL — CL (ref 150–400)
RBC: 3.69 MIL/uL — ABNORMAL LOW (ref 3.87–5.11)
RDW: 13.6 % (ref 11.5–15.5)
WBC: 5.5 10*3/uL (ref 4.0–10.5)
nRBC: 0 % (ref 0.0–0.2)

## 2021-11-08 LAB — BASIC METABOLIC PANEL
Anion gap: 4 — ABNORMAL LOW (ref 5–15)
BUN: 12 mg/dL (ref 6–20)
CO2: 28 mmol/L (ref 22–32)
Calcium: 9.4 mg/dL (ref 8.9–10.3)
Chloride: 108 mmol/L (ref 98–111)
Creatinine, Ser: 0.62 mg/dL (ref 0.44–1.00)
GFR, Estimated: >60 mL/min (ref >60)
Glucose, Bld: 144 mg/dL — ABNORMAL HIGH (ref 70–99)
Potassium: 3.8 mmol/L (ref 3.5–5.1)
Sodium: 140 mmol/L (ref 135–145)

## 2021-11-08 MED ORDER — ONDANSETRON HCL 4 MG/2ML IJ SOLN: 4.0000 mg | Freq: Four times a day (QID) | INTRAMUSCULAR | Status: DC | PRN

## 2021-11-08 MED ORDER — MONTELUKAST SODIUM 10 MG PO TABS
10.0000 mg | ORAL_TABLET | Freq: Every day | ORAL | Status: DC
  Administered 2021-11-09 – 2021-11-13 (×5): 10 mg via ORAL
  Filled 2021-11-08 (×5): qty 1

## 2021-11-08 MED ORDER — FAMOTIDINE 40 MG/5ML PO SUSR
10.0000 mg | Freq: Every day | ORAL | Status: DC
  Filled 2021-11-08: qty 2.5

## 2021-11-08 MED ORDER — ALBUTEROL SULFATE (2.5 MG/3ML) 0.083% IN NEBU
2.5000 mg | INHALATION_SOLUTION | Freq: Four times a day (QID) | RESPIRATORY_TRACT | Status: DC | PRN
Start: 2021-11-08 — End: 2021-11-13

## 2021-11-08 MED ORDER — DEXTROSE-NACL 5-0.45 % IV SOLN: INTRAVENOUS | Status: DC

## 2021-11-08 MED ORDER — SENNOSIDES-DOCUSATE SODIUM 8.6-50 MG PO TABS
2.0000 | ORAL_TABLET | Freq: Every day | ORAL | Status: DC
  Administered 2021-11-09 – 2021-11-13 (×5): 2 via ORAL
  Filled 2021-11-08 (×5): qty 2

## 2021-11-08 MED ORDER — PREDNISONE 20 MG PO TABS
60.0000 mg | ORAL_TABLET | Freq: Once | ORAL | Status: AC
  Administered 2021-11-08: 60 mg via ORAL
  Filled 2021-11-08: qty 3

## 2021-11-08 MED ORDER — POLYVINYL ALCOHOL 1.4 % OP SOLN: 1.0000 [drp] | Freq: Three times a day (TID) | OPHTHALMIC | Status: DC | PRN

## 2021-11-08 MED ORDER — BACLOFEN 10 MG PO TABS
40.0000 mg | ORAL_TABLET | Freq: Four times a day (QID) | ORAL | Status: DC
  Administered 2021-11-08 – 2021-11-13 (×17): 40 mg via ORAL
  Filled 2021-11-08 (×17): qty 4

## 2021-11-08 MED ORDER — TIZANIDINE HCL 4 MG PO TABS
8.0000 mg | ORAL_TABLET | Freq: Three times a day (TID) | ORAL | Status: DC
  Administered 2021-11-08 – 2021-11-13 (×14): 8 mg via ORAL
  Filled 2021-11-08 (×15): qty 2

## 2021-11-08 MED ORDER — LINACLOTIDE 145 MCG PO CAPS
145.0000 ug | ORAL_CAPSULE | Freq: Every day | ORAL | Status: DC
Start: 2021-11-09 — End: 2021-11-09
  Filled 2021-11-08: qty 1

## 2021-11-08 MED ORDER — DARIFENACIN HYDROBROMIDE ER 7.5 MG PO TB24
7.5000 mg | ORAL_TABLET | Freq: Every day | ORAL | Status: DC
  Administered 2021-11-09 – 2021-11-10 (×2): 7.5 mg via ORAL
  Filled 2021-11-08 (×2): qty 1

## 2021-11-08 MED ORDER — ONDANSETRON HCL 4 MG PO TABS: 4.0000 mg | ORAL_TABLET | Freq: Four times a day (QID) | ORAL | Status: DC | PRN

## 2021-11-08 MED ORDER — VITAMIN B-12 1000 MCG PO TABS
2500.0000 ug | ORAL_TABLET | Freq: Every day | ORAL | Status: DC
  Administered 2021-11-09 – 2021-11-13 (×5): 2500 ug via ORAL
  Filled 2021-11-08 (×5): qty 3

## 2021-11-08 MED ORDER — OXYCODONE HCL 5 MG PO TABS
10.0000 mg | ORAL_TABLET | ORAL | Status: DC | PRN
  Administered 2021-11-08 – 2021-11-13 (×20): 10 mg via ORAL
  Filled 2021-11-08 (×20): qty 2

## 2021-11-08 MED ORDER — VITAMIN D 25 MCG (1000 UNIT) PO TABS
5000.0000 [IU] | ORAL_TABLET | Freq: Every day | ORAL | Status: DC
  Administered 2021-11-09 – 2021-11-13 (×5): 5000 [IU] via ORAL
  Filled 2021-11-08 (×5): qty 5

## 2021-11-08 MED ORDER — DIAZEPAM 5 MG PO TABS
7.5000 mg | ORAL_TABLET | Freq: Three times a day (TID) | ORAL | Status: DC
  Administered 2021-11-08 – 2021-11-13 (×15): 7.5 mg via ORAL
  Filled 2021-11-08 (×17): qty 2

## 2021-11-08 MED ORDER — OXYMETAZOLINE HCL 0.05 % NA SOLN
1.0000 | Freq: Two times a day (BID) | NASAL | Status: AC | PRN
  Filled 2021-11-08: qty 15

## 2021-11-08 MED ORDER — POLYETHYLENE GLYCOL 3350 17 G PO PACK
17.0000 g | PACK | Freq: Every day | ORAL | Status: DC
  Administered 2021-11-09 – 2021-11-13 (×5): 17 g via ORAL
  Filled 2021-11-08 (×6): qty 1

## 2021-11-08 MED ORDER — PREDNISONE 20 MG PO TABS
60.0000 mg | ORAL_TABLET | Freq: Two times a day (BID) | ORAL | Status: DC
  Administered 2021-11-09 – 2021-11-11 (×6): 60 mg via ORAL
  Filled 2021-11-08 (×6): qty 3

## 2021-11-08 MED ORDER — BUPROPION HCL ER (XL) 150 MG PO TB24
300.0000 mg | ORAL_TABLET | Freq: Every day | ORAL | Status: DC
  Administered 2021-11-09 – 2021-11-13 (×5): 300 mg via ORAL
  Filled 2021-11-08 (×5): qty 2

## 2021-11-08 NOTE — Telephone Encounter (Signed)
Per Dr. Epimenio Foot, he spoke with hematology. Pt should proceed to ER for immediate evaluation/treatment.  I called pt and relayed she needed to proceed to ER. She states husband on the way home to bring her. Should be able to get to [facility] ER around 3pm. Asked if Dr. Epimenio Foot can do a direct admit. Advised he is unable but I will call [facility] to give them heads up that she will be coming. She verbalized understanding.   I called [facility] at [phone]. Spoke w/ operator who transferred me. I spoke w/ Journalist, newspaper. Made her aware pt will be coming. She verbalized understanding.

## 2021-11-08 NOTE — ED Triage Notes (Signed)
Pt reports being sent by her neurologist for low platelet level.

## 2021-11-08 NOTE — ED Provider Notes (Signed)
[facility] DEPT Provider Note   CSN: [id number] Arrival date & time: 11/08/21  1549     History  Chief Complaint  Patient presents with   Abnormal Labs     Sharon Odom is a 51 y.o. female.  Presented with department due to concern for low platelets.  Patient reports that she has a history of multiple sclerosis, formally on [place].  Gets periodic blood work for monitoring.  She had outpatient labs in her neurology office that showed low platelets.  Patient states that over the past week she has noted some easy bruising and a little bit of a rash.  No GI bleeding.  No headaches.  No increased confusion, no numbness, weakness, speech or vision change.  Advised to come to ER for evaluation.  Reviewed last neurology note from Dr. Krista Blue.  HPI     Home Medications Prior to Admission medications   Medication Sig Start Date End Date Taking? Authorizing Provider  albuterol (VENTOLIN HFA) 108 (90 Base) MCG/ACT inhaler Inhale 1-2 puffs into the lungs every 6 (six) hours as needed for wheezing or shortness of breath. 04/15/19  Yes Angiulli, Lavon Paganini, PA-C  baclofen (LIORESAL) 20 MG tablet Take 2 tablets (40 mg total) by mouth 4 (four) times daily. Since ITB pump out 10/24/21  Yes Lovorn, Jinny Blossom, MD  buPROPion (WELLBUTRIN XL) 300 MG 24 hr tablet TAKE 1 TABLET DAILY Patient taking differently: Take 300 mg by mouth daily. 04/20/20  Yes Lovorn, Jinny Blossom, MD  carboxymethylcellulose (REFRESH PLUS) 0.5 % SOLN Place 1 drop into both eyes 3 (three) times daily as needed (dry eyes).   Yes [provider]  Cholecalciferol (VITAMIN D-3) 125 MCG (5000 UT) TABS Take 5,000 Units by mouth daily. 04/15/19  Yes Angiulli, Lavon Paganini, PA-C  conjugated estrogens (PREMARIN) vaginal cream Place 1 applicator vaginally 3 (three) times a week.   Yes [provider]  Cyanocobalamin (VITAMIN B-12) 2500 MCG SUBL Place 1 tablet (2,500 mcg total) under the tongue daily. 04/15/19  Yes  Angiulli, Lavon Paganini, PA-C  dalfampridine 10 MG TB12 Take 1 tablet (10 mg total) by mouth 2 (two) times daily. One po bid 03/16/21  Yes Sater, Nanine Means, MD  diazepam (VALIUM) 5 MG tablet Take 1.5 tablets (7.5 mg total) by mouth every 8 (eight) hours. 06/16/21  Yes Lovorn, Jinny Blossom, MD  montelukast (SINGULAIR) 10 MG tablet TAKE 1 TABLET DAILY Patient taking differently: Take 10 mg by mouth daily after breakfast. 04/20/20  Yes Lovorn, Jinny Blossom, MD  Oxycodone HCl 10 MG TABS Take 1 tablet (10 mg total) by mouth every 4 (four) hours as needed. Patient taking differently: Take 10 mg by mouth every 4 (four) hours as needed (pain). 10/17/21  Yes Lovorn, Jinny Blossom, MD  oxymetazoline (AFRIN) 0.05 % nasal spray Place 1 spray into both nostrils 2 (two) times daily as needed for congestion.   Yes [provider]  polyethylene glycol (MIRALAX / GLYCOLAX) 17 g packet Take 17 g by mouth daily.   Yes [provider]  raNITIdine HCl (ZANTAC PO) Take 10 mg by mouth daily as needed.   Yes [provider]  senna-docusate (SENOKOT-S) 8.6-50 MG tablet Take 1 tablet by mouth daily. Patient taking differently: Take 2 tablets by mouth daily. 04/27/19  Yes Raulkar, Clide Deutscher, MD  tiZANidine (ZANAFLEX) 4 MG tablet TAKE 2 TABLETS THREE TIMES A DAY Patient taking differently: Take 8 mg by mouth 3 (three) times daily. 10/12/21  Yes Lovorn, Jinny Blossom, MD  trospium Cloyd Stagers)  20 MG tablet Take 20 mg by mouth 2 (two) times daily.   Yes [provider]  Wheat Dextrin (BENEFIBER) POWD Take 3.5 Scoops by mouth 2 (two) times daily.   Yes [provider]  linaclotide (LINZESS) 145 MCG CAPS capsule Take 1 capsule (145 mcg total) by mouth daily before breakfast. In setting of severe constipation in spite of multiple doses of bowels meds- - due to neurologically based constipation made worse by Baclofen and Opiates Patient not taking: Reported on 11/08/2021 09/22/21   Courtney Heys, MD      Allergies     Ziconotide acetate, Morphine, Amoxicillin, Morphine sulfate, and Pregabalin    Review of Systems   Review of Systems  Constitutional:  Negative for chills and fever.  HENT:  Negative for ear pain and sore throat.   Eyes:  Negative for pain and visual disturbance.  Respiratory:  Negative for cough and shortness of breath.   Cardiovascular:  Negative for chest pain and palpitations.  Gastrointestinal:  Negative for abdominal pain and vomiting.  Genitourinary:  Negative for dysuria and hematuria.  Musculoskeletal:  Negative for arthralgias and back pain.  Skin:  Negative for color change and rash.  Neurological:  Negative for seizures and syncope.  All other systems reviewed and are negative.   Physical Exam Updated Vital Signs BP 105/64   Pulse 92   Temp 98 F (36.7 C) (Oral)   Resp 18   SpO2 99%  Physical Exam Vitals and nursing note reviewed.  Constitutional:      General: She is not in acute distress.    Appearance: She is well-developed.  HENT:     Head: Normocephalic and atraumatic.  Eyes:     Conjunctiva/sclera: Conjunctivae normal.  Cardiovascular:     Rate and Rhythm: Normal rate and regular rhythm.     Heart sounds: No murmur heard. Pulmonary:     Effort: Pulmonary effort is normal. No respiratory distress.     Breath sounds: Normal breath sounds.  Abdominal:     Palpations: Abdomen is soft.     Tenderness: There is no abdominal tenderness.  Musculoskeletal:        General: No swelling.     Cervical back: Neck supple.  Skin:    Capillary Refill: Capillary refill takes less than 2 seconds.     Comments: Warm, occasional petechiae over legs and arms  Neurological:     Mental Status: She is alert.  Psychiatric:        Mood and Affect: Mood normal.     ED Results / Procedures / Treatments   Labs (all labs ordered are listed, but only abnormal results are displayed) Labs Reviewed  CBC - Abnormal; Notable for the following components:      Result Value    RBC 3.69 (*)    Hemoglobin 11.8 (*)    HCT 35.7 (*)    Platelets <5 (*)    All other components within normal limits  BASIC METABOLIC PANEL - Abnormal; Notable for the following components:   Glucose, Bld 144 (*)    Anion gap 4 (*)    All other components within normal limits  DIFFERENTIAL  CBC  COMPREHENSIVE METABOLIC PANEL    EKG None  Radiology No results found.  Procedures Procedures    Medications Ordered in ED Medications  dextrose 5 %-0.45 % sodium chloride infusion ( Intravenous New Bag/Given 11/08/21 2122)  ondansetron (ZOFRAN) tablet 4 mg (has no administration in time range)  Or  ondansetron (ZOFRAN) injection 4 mg (has no administration in time range)  predniSONE (DELTASONE) tablet 60 mg (has no administration in time range)  albuterol (PROVENTIL) (2.5 MG/3ML) 0.083% nebulizer solution 2.5 mg (has no administration in time range)  cholecalciferol (VITAMIN D3) tablet 5,000 Units (has no administration in time range)  vitamin B-12 (CYANOCOBALAMIN) tablet 2,500 mcg (has no administration in time range)  senna-docusate (Senokot-S) tablet 2 tablet (has no administration in time range)  buPROPion (WELLBUTRIN XL) 24 hr tablet 300 mg (has no administration in time range)  montelukast (SINGULAIR) tablet 10 mg (has no administration in time range)  polyethylene glycol (MIRALAX / GLYCOLAX) packet 17 g (has no administration in time range)  oxymetazoline (AFRIN) 0.05 % nasal spray 1 spray (has no administration in time range)  polyvinyl alcohol (LIQUIFILM TEARS) 1.4 % ophthalmic solution 1 drop (has no administration in time range)  diazepam (VALIUM) tablet 7.5 mg (7.5 mg Oral Given 11/08/21 2120)  linaclotide (LINZESS) capsule 145 mcg (has no administration in time range)  tiZANidine (ZANAFLEX) tablet 8 mg (8 mg Oral Given 11/08/21 2120)  oxyCODONE (Oxy IR/ROXICODONE) immediate release tablet 10 mg (10 mg Oral Given 11/08/21 2120)  baclofen (LIORESAL) tablet 40 mg (40 mg  Oral Given 11/08/21 2120)  darifenacin (ENABLEX) 24 hr tablet 7.5 mg (has no administration in time range)  famotidine (PEPCID) 40 MG/5ML suspension 10.4 mg (has no administration in time range)  predniSONE (DELTASONE) tablet 60 mg (60 mg Oral Given 11/08/21 1751)    ED Course/ Medical Decision Making/ A&P Clinical Course as of 11/08/21 2259  Wed Nov 08, 2021  1727  Holland Falling  [RD]    Clinical Course User Index [RD] Lucrezia Starch, MD                           Medical Decision Making Risk Prescription drug management. Decision regarding hospitalization.   51 year old lady presenting to ER for abnormal lab.  She had thrombocytopenia on labs from neurology.  Repeated labs today, platelet count is undetectable here.  She has some mild bruises and some occasional petechiae but no active bleeding and denies any sort of GI bleeding.  I discussed the case with hematology oncology, Dr. Alen Blew.  He advised starting oral steroids 1 mg/kg prednisone for now.  Can discharge patient if patient comfortable and agreeable for admit for closer monitoring.  If patient admitted he will see in consult tomorrow.  Updated patient, discussed with her husband.  Both would strongly prefer to be admitted if at all possible.  Given the severity of her thrombocytopenia, feel this is reasonable option.  Discussed case with Dr. Jonelle Sidle for hospitalist who will admit.  After the discussed management above, the patient was determined to be safe for discharge.  The patient was in agreement with this plan and all questions regarding their care were answered.  ED return precautions were discussed and the patient will return to the ED with any significant worsening of condition.         Final Clinical Impression(s) / ED Diagnoses Final diagnoses:  None    Rx / DC Orders ED Discharge Orders     None         Lucrezia Starch, MD 11/08/21 2259

## 2021-11-08 NOTE — Telephone Encounter (Signed)
DR. Dagoberto Ligas IS OUT OF THE OFFICE THE REST OF THE WEEK:  As a patient request: Dr. Garth Bigness  office nurse called to see if Dr. Dagoberto Ligas could do a direct admit today. For what maybe abnormal labs.   Caller was informed  Dr. Dagoberto Ligas is out of the office the rest of the week. And we are not able to do direct admits.

## 2021-11-08 NOTE — H&P (Signed)
History and Physical    Patient: Sharon Odom [id number] DOB: Jan 26, 1971 DOA: 11/08/2021 DOS: the patient was seen and examined on 11/08/2021 PCP: Oren Section, NP-C  Patient coming from: Home  Chief Complaint:  Chief Complaint  Patient presents with   Abnormal Labs    HPI: Sharon Odom is a 51 y.o. female with medical history significant of multiple sclerosis, GERD, visual abnormalities, anxiety disorder, asthma, depression, chronic pain syndrome with intrathecal pump implant history who was sent over to the ER by her neurologist secondary to low platelets.  Patient was apparently on [place] for her multiple sclerosis.  She has had it for a while and is scheduled.  Repeat blood work.  She went for outpatient follow-up today to the neurologist office and was found to have platelets of 4.  Her platelets were 134 about a month ago.  The medication was held and patient is sent over to the ER for evaluation.  Oncology also consulted about the patient.  Recommendation was to initiate steroids and oncology will follow inpatient or outpatient.  Patient is therefore being admitted to our service for that purpose.  She denied any fever or chills.  Denied any nausea or vomiting.  She is very worried but otherwise no new issues.  Review of Systems: As mentioned in the history of present illness. All other systems reviewed and are negative. Past Medical History:  Diagnosis Date   Allergies    Anxiety    Arthritis    Asthma    seasonal - pollen   Constipation    Depression    Dyspnea    with exertion   GERD (gastroesophageal reflux disease)    Headache    History of hiatal hernia    Left radial nerve palsy 03/15/2020   resolved - Left arm/hand weak   Multiple sclerosis ([place])    Pneumonia 2007   x 1   Vision abnormalities    Wears glasses    Past Surgical History:  Procedure Laterality Date   EXCISION MORTON'S NEUROMA Right    fallopian tube removal     INTRATHECAL PUMP IMPLANT  Right 02/18/2019   Procedure: INTRATHECAL PUMP IMPLANT;  Surgeon: Clydell Hakim, MD;  Location: [facility];  Service: Neurosurgery;  Laterality: Right;  INTRATHECAL PUMP IMPLANT   INTRATHECAL PUMP IMPLANT Right 02/17/2021   Procedure: Baclofen Pump Replacement;  Surgeon: Erline Levine, MD;  Location: [facility];  Service: Neurosurgery;  Laterality: Right;  3C/RM 21   PAIN PUMP IMPLANTATION N/A 09/28/2021   Procedure: Insertion of baclofen pump, right lower quadrant;  Surgeon: Dawley, Theodoro Doing, DO;  Location: [facility];  Service: Neurosurgery;  Laterality: N/A;   PAIN PUMP REMOVAL Left 04/06/2021   Procedure: Removal of baclofen pump, Left;  Surgeon: Dawley, Theodoro Doing, DO;  Location: [facility];  Service: Neurosurgery;  Laterality: Left;  Lateral/Left//3C rm 19   UPPER GI ENDOSCOPY     WISDOM TOOTH EXTRACTION     WISDOM TOOTH EXTRACTION     Social History:  reports that she has been smoking cigarettes. She started smoking about 35 years ago. She has a 16.00 pack-year smoking history. She has never used smokeless tobacco. She reports that she does not currently use alcohol. She reports that she does not use drugs.  Allergies  Allergen Reactions   Ziconotide Acetate Shortness Of Breath    Anorexia, weird sensations, could not talk, choking feeling, lost since of taste and smell. Hallucinations, vivid dreams. Confusion and sleepiness. N/v, increase in pain.  Anorexia, weird sensations, could not talk, choking feeling, lost since of taste and smell. Hallucinations, vivid dreams. Confusion and sleepiness. N/v, increase in pain.    Morphine Other (See Comments)    Pt does not recall reaction    Amoxicillin Nausea And Vomiting   Morphine Sulfate Other (See Comments)    Unknown   Pregabalin Nausea And Vomiting    Family History  Problem Relation Age of Onset   Diabetes Mother    Healthy Brother     Prior to Admission medications   Medication Sig Start Date End Date Taking? Authorizing Provider  albuterol (VENTOLIN  HFA) 108 (90 Base) MCG/ACT inhaler Inhale 1-2 puffs into the lungs every 6 (six) hours as needed for wheezing or shortness of breath. 04/15/19  Yes Angiulli, Lavon Paganini, PA-C  baclofen (LIORESAL) 20 MG tablet Take 2 tablets (40 mg total) by mouth 4 (four) times daily. Since ITB pump out 10/24/21  Yes Lovorn, Jinny Blossom, MD  buPROPion (WELLBUTRIN XL) 300 MG 24 hr tablet TAKE 1 TABLET DAILY Patient taking differently: Take 300 mg by mouth daily. 04/20/20  Yes Lovorn, Jinny Blossom, MD  carboxymethylcellulose (REFRESH PLUS) 0.5 % SOLN Place 1 drop into both eyes 3 (three) times daily as needed (dry eyes).   Yes [provider]  Cholecalciferol (VITAMIN D-3) 125 MCG (5000 UT) TABS Take 5,000 Units by mouth daily. 04/15/19  Yes Angiulli, Lavon Paganini, PA-C  conjugated estrogens (PREMARIN) vaginal cream Place 1 applicator vaginally 3 (three) times a week.   Yes [provider]  Cyanocobalamin (VITAMIN B-12) 2500 MCG SUBL Place 1 tablet (2,500 mcg total) under the tongue daily. 04/15/19  Yes Angiulli, Lavon Paganini, PA-C  dalfampridine 10 MG TB12 Take 1 tablet (10 mg total) by mouth 2 (two) times daily. One po bid 03/16/21  Yes Sater, Nanine Means, MD  diazepam (VALIUM) 5 MG tablet Take 1.5 tablets (7.5 mg total) by mouth every 8 (eight) hours. 06/16/21  Yes Lovorn, Jinny Blossom, MD  montelukast (SINGULAIR) 10 MG tablet TAKE 1 TABLET DAILY Patient taking differently: Take 10 mg by mouth daily after breakfast. 04/20/20  Yes Lovorn, Jinny Blossom, MD  Oxycodone HCl 10 MG TABS Take 1 tablet (10 mg total) by mouth every 4 (four) hours as needed. Patient taking differently: Take 10 mg by mouth every 4 (four) hours as needed (pain). 10/17/21  Yes Lovorn, Jinny Blossom, MD  oxymetazoline (AFRIN) 0.05 % nasal spray Place 1 spray into both nostrils 2 (two) times daily as needed for congestion.   Yes [provider]  polyethylene glycol (MIRALAX / GLYCOLAX) 17 g packet Take 17 g by mouth daily.   Yes [provider]  raNITIdine HCl  (ZANTAC PO) Take 10 mg by mouth daily as needed.   Yes [provider]  senna-docusate (SENOKOT-S) 8.6-50 MG tablet Take 1 tablet by mouth daily. Patient taking differently: Take 2 tablets by mouth daily. 04/27/19  Yes Raulkar, Clide Deutscher, MD  tiZANidine (ZANAFLEX) 4 MG tablet TAKE 2 TABLETS THREE TIMES A DAY Patient taking differently: Take 8 mg by mouth 3 (three) times daily. 10/12/21  Yes Lovorn, Jinny Blossom, MD  trospium (SANCTURA) 20 MG tablet Take 20 mg by mouth 2 (two) times daily.   Yes [provider]  Wheat Dextrin (BENEFIBER) POWD Take 3.5 Scoops by mouth 2 (two) times daily.   Yes [provider]  linaclotide (LINZESS) 145 MCG CAPS capsule Take 1 capsule (145 mcg total) by mouth daily before breakfast. In setting of severe constipation in spite of  multiple doses of bowels meds- - due to neurologically based constipation made worse by Baclofen and Opiates Patient not taking: Reported on 11/08/2021 09/22/21   Courtney Heys, MD    Physical Exam: Vitals:   11/08/21 1556 11/08/21 1645 11/08/21 1700 11/08/21 1730  BP: 116/80 109/64 102/67 105/64  Pulse: 89 81 80 92  Resp: 18 18 17 18   Temp: 98 F (36.7 C)     TempSrc: Oral     SpO2: 100% 97% 100% 99%   Generally: Chronically ill looking, multiple contractures, no acute distress HEENT: PERRL, no pallor or jaundice Neck: Supple, no JVD no lymphadenopathy Respiratory: Good air entry bilaterally no wheeze rales or crackles Cardiovascular: Regular rate and rhythm Abdomen: Soft, nontender with positive bowel sounds Extremities: No edema sinus or clubbing Skin exam: Multiple petechiae all over her body.  Data Reviewed:  Patient's hemoglobin is 11.8.  Platelets less than 5.  Chemistry appeared to be within normal.  All vitals appear to be stable  Assessment and Plan:  #1 drug-induced thrombocytopenia: Patient has critically low platelets of only 4000.  Case discussed with oncology.  Recommendation is prednisone 1  mg/kg which has been initiated.  Patient will be admitted for observation overnight.  Oncology to see in the morning for further recommendations.  Offending drug has been held.  #2 multiple sclerosis: Deferred to neurology for any further new medications.  Patient does appear to be stable in remission.  Has advanced disease with contractures and neuropathy.  On baclofen and tizanidine among other things.  We will continue  #3 anxiety disorder: Patient is on Wellbutrin and diazepam from home.  We will continue  #4 urinary incontinence: Continue with Enablex.   History of asthma: No acute exacerbation.  Continue to monitor    Advance Care Planning:   Code Status: Prior full code  Consults: Dr. Alen Blew, oncology  Family Communication: No family at bedside  Severity of Illness: The appropriate patient status for this patient is OBSERVATION. Observation status is judged to be reasonable and necessary in order to provide the required intensity of service to ensure the patient's safety. The patient's presenting symptoms, physical exam findings, and initial radiographic and laboratory data in the context of their medical condition is felt to place them at decreased risk for further clinical deterioration. Furthermore, it is anticipated that the patient will be medically stable for discharge from the hospital within 2 midnights of admission.   AuthorBarbette Merino, MD 11/08/2021 7:40 PM  For on call review [email or web address].

## 2021-11-08 NOTE — Telephone Encounter (Signed)
  I spoke to Sharon Odom to discuss the very low platelet count of 4.  This is a critical value and she could be at risk of spontaneous bleeds.  She does report that for a couple days she has had some small purpura type lesions on the arms and.  She notes significantly more lesions today than yesterday.  I had also messaged hematology/oncology.  Due to her progressing over the last day I will have her go to the [facility] emergency room so she can get more urgent evaluation and treatment.  I forwarded the name of the patient to Dr. Alen Blew who is on-call today.

## 2021-11-08 NOTE — Telephone Encounter (Signed)
Dr. Felecia Shelling requested we call pt to let her know to go to [facility] and not [facility]. I called and spoke with her. She will have husband bring her to [facility] instead. Provided address:  [address] [place], [place] [address].  Advised Dr. Felecia Shelling advised Hematology aware of her situation. She verbalized understanding.

## 2021-11-08 NOTE — Telephone Encounter (Signed)
Dr. Felecia Shelling- Took call from phone staff and spoke w/ Gina/[facility]. She is calling to report that CBC drawn 11/06/21 at 8am came back with Platelet count of 4. She will fax copy of report to Dr. Felecia Shelling at [phone]. Attached report below for your review.

## 2021-11-08 NOTE — ED Provider Triage Note (Signed)
Emergency Medicine Provider Triage Evaluation Note  Sharon Odom , a 51 y.o. female  was evaluated in triage.  Pt complains of abnormal labs.  Patient has history of MS, gets monthly labs drawn due to medication she takes.  Patient was advised that she had a critical lab value, platelets with a value of 4, these labs were drawn on 11/06/2021.  Patient was advised to proceed to ER for evaluation.  Patient complaining of excessive bruising and petechiae to lower extremities.  Patient denies any fevers, nausea, vomiting, blood in urine, blood in stool.  Review of Systems  Positive:  Negative:   Physical Exam  BP 116/80 (BP Location: Left Arm)   Pulse 89   Temp 98 F (36.7 C) (Oral)   Resp 18   SpO2 100%  Gen:   Awake, no distress   Resp:  Normal effort  MSK:   Moves extremities without difficulty  Other:    Medical Decision Making  Medically screening exam initiated at 4:03 PM.  Appropriate orders placed.  Sharon Odom was informed that the remainder of the evaluation will be completed by another provider, this initial triage assessment does not replace that evaluation, and the importance of remaining in the ED until their evaluation is complete.     Azucena Cecil, PA-C 11/08/21 1605

## 2021-11-09 DIAGNOSIS — D693 Immune thrombocytopenic purpura: Secondary | ICD-10-CM | POA: Diagnosis present

## 2021-11-09 DIAGNOSIS — Z9689 Presence of other specified functional implants: Secondary | ICD-10-CM | POA: Diagnosis present

## 2021-11-09 DIAGNOSIS — J302 Other seasonal allergic rhinitis: Secondary | ICD-10-CM | POA: Diagnosis present

## 2021-11-09 DIAGNOSIS — Z888 Allergy status to other drugs, medicaments and biological substances status: Secondary | ICD-10-CM | POA: Diagnosis not present

## 2021-11-09 DIAGNOSIS — M1990 Unspecified osteoarthritis, unspecified site: Secondary | ICD-10-CM | POA: Diagnosis present

## 2021-11-09 DIAGNOSIS — Z79899 Other long term (current) drug therapy: Secondary | ICD-10-CM | POA: Diagnosis not present

## 2021-11-09 DIAGNOSIS — H539 Unspecified visual disturbance: Secondary | ICD-10-CM | POA: Diagnosis present

## 2021-11-09 DIAGNOSIS — D6959 Other secondary thrombocytopenia: Secondary | ICD-10-CM | POA: Diagnosis present

## 2021-11-09 DIAGNOSIS — T50995A Adverse effect of other drugs, medicaments and biological substances, initial encounter: Secondary | ICD-10-CM | POA: Diagnosis present

## 2021-11-09 DIAGNOSIS — G894 Chronic pain syndrome: Secondary | ICD-10-CM | POA: Diagnosis present

## 2021-11-09 DIAGNOSIS — R32 Unspecified urinary incontinence: Secondary | ICD-10-CM | POA: Diagnosis present

## 2021-11-09 DIAGNOSIS — F419 Anxiety disorder, unspecified: Secondary | ICD-10-CM | POA: Diagnosis present

## 2021-11-09 DIAGNOSIS — D649 Anemia, unspecified: Secondary | ICD-10-CM | POA: Diagnosis present

## 2021-11-09 DIAGNOSIS — K219 Gastro-esophageal reflux disease without esophagitis: Secondary | ICD-10-CM | POA: Diagnosis present

## 2021-11-09 DIAGNOSIS — D696 Thrombocytopenia, unspecified: Secondary | ICD-10-CM | POA: Diagnosis not present

## 2021-11-09 DIAGNOSIS — G629 Polyneuropathy, unspecified: Secondary | ICD-10-CM | POA: Diagnosis present

## 2021-11-09 DIAGNOSIS — Z9079 Acquired absence of other genital organ(s): Secondary | ICD-10-CM | POA: Diagnosis not present

## 2021-11-09 DIAGNOSIS — Z885 Allergy status to narcotic agent status: Secondary | ICD-10-CM | POA: Diagnosis not present

## 2021-11-09 DIAGNOSIS — Z79890 Hormone replacement therapy (postmenopausal): Secondary | ICD-10-CM | POA: Diagnosis not present

## 2021-11-09 DIAGNOSIS — G35 Multiple sclerosis: Secondary | ICD-10-CM | POA: Diagnosis present

## 2021-11-09 DIAGNOSIS — Z881 Allergy status to other antibiotic agents status: Secondary | ICD-10-CM | POA: Diagnosis not present

## 2021-11-09 DIAGNOSIS — F17210 Nicotine dependence, cigarettes, uncomplicated: Secondary | ICD-10-CM | POA: Diagnosis present

## 2021-11-09 DIAGNOSIS — F32A Depression, unspecified: Secondary | ICD-10-CM | POA: Diagnosis present

## 2021-11-09 LAB — CBC
HCT: 36.7 % (ref 36.0–46.0)
Hemoglobin: 12 g/dL (ref 12.0–15.0)
MCH: 31.3 pg (ref 26.0–34.0)
MCHC: 32.7 g/dL (ref 30.0–36.0)
MCV: 95.8 fL (ref 80.0–100.0)
Platelets: <5 10*3/uL — CL (ref 150–400)
RBC: 3.83 MIL/uL — ABNORMAL LOW (ref 3.87–5.11)
RDW: 13.6 % (ref 11.5–15.5)
WBC: 7.7 10*3/uL (ref 4.0–10.5)
nRBC: 0 % (ref 0.0–0.2)

## 2021-11-09 LAB — COMPREHENSIVE METABOLIC PANEL
ALT: 24 U/L (ref 0–44)
AST: 21 U/L (ref 15–41)
Albumin: 3.7 g/dL (ref 3.5–5.0)
Alkaline Phosphatase: 68 U/L (ref 38–126)
Anion gap: 6 (ref 5–15)
BUN: 12 mg/dL (ref 6–20)
CO2: 26 mmol/L (ref 22–32)
Calcium: 9.4 mg/dL (ref 8.9–10.3)
Chloride: 109 mmol/L (ref 98–111)
Creatinine, Ser: 0.65 mg/dL (ref 0.44–1.00)
GFR, Estimated: >60 mL/min (ref >60)
Glucose, Bld: 144 mg/dL — ABNORMAL HIGH (ref 70–99)
Potassium: 4.4 mmol/L (ref 3.5–5.1)
Sodium: 141 mmol/L (ref 135–145)
Total Bilirubin: 0.6 mg/dL (ref 0.3–1.2)
Total Protein: 6.9 g/dL (ref 6.5–8.1)

## 2021-11-09 MED ORDER — DALFAMPRIDINE ER 10 MG PO TB12
10.0000 mg | ORAL_TABLET | Freq: Two times a day (BID) | ORAL | Status: DC
  Administered 2021-11-09 – 2021-11-13 (×8): 10 mg via ORAL

## 2021-11-09 MED ORDER — ACETAMINOPHEN 325 MG PO TABS
650.0000 mg | ORAL_TABLET | Freq: Once | ORAL | Status: AC
Start: 2021-11-09 — End: 2021-11-09
  Administered 2021-11-09: 650 mg via ORAL
  Filled 2021-11-09: qty 2

## 2021-11-09 MED ORDER — PANTOPRAZOLE SODIUM 20 MG PO TBEC
20.0000 mg | DELAYED_RELEASE_TABLET | Freq: Every day | ORAL | Status: DC
  Administered 2021-11-09 – 2021-11-13 (×5): 20 mg via ORAL
  Filled 2021-11-09 (×5): qty 1

## 2021-11-09 MED ORDER — IMMUNE GLOBULIN (HUMAN) 20 GM/200ML IV SOLN
1.0000 g/kg | INTRAVENOUS | Status: AC
  Administered 2021-11-09 – 2021-11-10 (×2): 60 g via INTRAVENOUS
  Filled 2021-11-09 (×2): qty 600

## 2021-11-09 NOTE — TOC Progression Note (Addendum)
Transition of Care [facility]) - Progression Note    Patient Details  Name: Sharon Odom MRN: [id number] Date of Birth: Dec 01, 1970  [facility] CM/SW Contact  Lennart Pall, LCSW Phone Number: 11/10/2021, 9:42 AM  Clinical Narrative:     Received TOC referral to provide pt with transportation resources.  Met with pt who confirms she and husband live in [place] and, while spouse does assist with transportation, "he's getting tired of all the appointments."  I have provided pt with written information on [facility] and process of setting up medical/ wheelchair level transport.  She is aware it may be beneficial to contact her insurance company to see if they might offer some medical transport assist as well.  No further TOC needs.  Please re-order TOC as needed.   ADDENDUM:  Alerted that pt is active for HHPT/OT/aide with [facility].  New orders place for these services to resume at discharge.    Expected Discharge Plan and Services                                     HH Arranged: Nurse's Aide [place] Agency: [facility]         Social Determinants of Health (SDOH) Interventions    Readmission Risk Interventions     No data to display

## 2021-11-09 NOTE — Progress Notes (Signed)
PROGRESS NOTE    Sharon Odom  OFB:[id number] DOB: 05/14/1971 DOA: 11/08/2021  PCP: Charisse March, NP-C   Brief Narrative:  This 51 years old female with PMH significant for multiple sclerosis, GERD, visual abnormalities, anxiety disorder, asthma, depression, chronic pain syndrome with intrathecal pump implant, who was sent to the ED by her neurologist secondary to low platelets.  Patient was apparently on Lemtrada for her multiple sclerosis.  Patient has received that medication for almost 4 years.  She was due for her blood work.  She went for outpatient follow-up and she was found to have platelet count of 4000.  Her platelets were 134K one month ago.  The medication was held and patient was sent in the ED. Patient was admitted for likely drug induced thrombocytopenia.  Hematology was consulted patient was started on prednisone and IVIG treatment.   Assessment & Plan:   Principal Problem:   Thrombocytopenia (HCC) Active Problems:   Depression with anxiety   Asthma   Multiple sclerosis (HCC)   Urinary incontinence  Drug-induced thrombocytopenia: Patient has critically low platelet of 4K.  Last known platelet was 134 K last month. Patient has been receiving Lemtrada for multiple sclerosis for last 4 years. Patient was evaluated by oncology recommended to continue prednisone and  start IVIG treatment. [name] has been discontinued.  Continue to monitor platelet count. Patient has multiple bruises but no obvious visible bleeding noted  Multiple sclerosis: Patient will follow-up with neurology for further new medications. MS seems to be in a stable condition in remission Patient has advanced disease with contractures and neuropathy. Continue baclofen and tizanidine.  Anxiety disorder: Continue Wellbutrin 300 mg daily  and diazepam 7.5 mg q 8hrs  Urinary incontinence : Continue Enablex 7.5 mg daily.   History of asthma: Continue home inhalers,  not in any acute  exacerbation.   DVT prophylaxis: SCDs Code Status: Full code. Family Communication: No family at bed side. Disposition Plan:   Status is: Observation The patient will require care spanning > 2 midnights and should be moved to inpatient because:  Admitted for drug-induced thrombocytopenia with a platelet count of 4K requiring IVIG and prednisone.   Consultants:  Hematology oncology  Procedures: None  Antimicrobials:None   Subjective: Patient was seen and examined at bedside.  Overnight events noted. Patient reports feeling better,  she denies any bleeding but has bruising noted on upper and lower extremities.  Objective: Vitals:   11/09/21 0800 11/09/21 0934 11/09/21 1050 11/09/21 1115  BP: 114/76 105/71 116/69 109/62  Pulse: 89 95 97 93  Resp: 16 16 18 16   Temp: 98.1 F (36.7 C) 98 F (36.7 C) 98.1 F (36.7 C) 98 F (36.7 C)  TempSrc: Oral Oral Oral Oral  SpO2: 100% 94% 100%   Weight:      Height:        Intake/Output Summary (Last 24 hours) at 11/09/2021 1141 Last data filed at 11/09/2021 0934 Gross per 24 hour  Intake 120 ml  Output 500 ml  Net -380 ml   Filed Weights   11/08/21 2000  Weight: 59 kg    Examination:  General exam: Appears comfortable, not in any acute distress.  Deconditioned. Respiratory system: CTA bilaterally, no wheezing, no crackles, normal respiratory effort. Cardiovascular system: S1-S2 heard, regular rate and rhythm, no murmur. Gastrointestinal system: Abdomen is soft, non tender, non distended, BS +. Central nervous system: Alert and oriented x 3 . No focal neurological deficits. Extremities: No edema, no cyanosis, no clubbing.  Multiple contractures. Skin: Multiple bruises and petechiae all over the body. Psychiatry: Judgement and insight appear normal. Mood & affect appropriate.     Data Reviewed: I have personally reviewed following labs and imaging studies  CBC: Recent Labs  Lab 11/08/21 1603 11/09/21 0339  WBC 5.5  7.7  NEUTROABS 3.3  --   HGB 11.8* 12.0  HCT 35.7* 36.7  MCV 96.7 95.8  PLT <5* <5*   Basic Metabolic Panel: Recent Labs  Lab 11/08/21 1603 11/09/21 0339  NA 140 141  K 3.8 4.4  CL 108 109  CO2 28 26  GLUCOSE 144* 144*  BUN 12 12  CREATININE 0.62 0.65  CALCIUM 9.4 9.4   GFR: Estimated Creatinine Clearance: 79.2 mL/min (by C-G formula based on SCr of 0.65 mg/dL). Liver Function Tests: Recent Labs  Lab 11/09/21 0339  AST 21  ALT 24  ALKPHOS 68  BILITOT 0.6  PROT 6.9  ALBUMIN 3.7   No results for input(s): "LIPASE", "AMYLASE" in the last 168 hours. No results for input(s): "AMMONIA" in the last 168 hours. Coagulation Profile: No results for input(s): "INR", "PROTIME" in the last 168 hours. Cardiac Enzymes: No results for input(s): "CKTOTAL", "CKMB", "CKMBINDEX", "TROPONINI" in the last 168 hours. BNP (last 3 results) No results for input(s): "PROBNP" in the last 8760 hours. HbA1C: No results for input(s): "HGBA1C" in the last 72 hours. CBG: No results for input(s): "GLUCAP" in the last 168 hours. Lipid Profile: No results for input(s): "CHOL", "HDL", "LDLCALC", "TRIG", "CHOLHDL", "LDLDIRECT" in the last 72 hours. Thyroid Function Tests: No results for input(s): "TSH", "T4TOTAL", "FREET4", "T3FREE", "THYROIDAB" in the last 72 hours. Anemia Panel: No results for input(s): "VITAMINB12", "FOLATE", "FERRITIN", "TIBC", "IRON", "RETICCTPCT" in the last 72 hours. Sepsis Labs: No results for input(s): "PROCALCITON", "LATICACIDVEN" in the last 168 hours.  No results found for this or any previous visit (from the past 240 hour(s)).   Radiology Studies: No results found.  Scheduled Meds:  baclofen  40 mg Oral QID   buPROPion  300 mg Oral Daily   cholecalciferol  5,000 Units Oral Daily   dalfampridine  10 mg Oral BID   darifenacin  7.5 mg Oral Daily   diazepam  7.5 mg Oral Q8H   montelukast  10 mg Oral QPC breakfast   pantoprazole  20 mg Oral Daily   polyethylene  glycol  17 g Oral Daily   predniSONE  60 mg Oral BID WC   senna-docusate  2 tablet Oral Daily   tiZANidine  8 mg Oral TID   vitamin B-12  2,500 mcg Oral Daily   Continuous Infusions:  dextrose 5 % and 0.45% NaCl 50 mL/hr at 11/08/21 2122   IMMUNE GLOBULIN 10% (HUMAN) IV - For Fluid Restriction Only 60 g (11/09/21 1042)     LOS: 0 days    Time spent: 50 mins    Gershon Shorten, MD [facility]   If 7PM-7AM, please contact night-coverage

## 2021-11-09 NOTE — Consult Note (Signed)
Reason for the request:    Thrombocytopenia  HPI: I was asked by Dr. Lucianne Muss to evaluate Sharon Odom for the evaluation of thrombocytopenia.  She is a 51 year old woman with history of multiple sclerosis with history of prednisone use when she gets flareups.  Most recently she has been on Lemtarda although it is unclear to me that she has received it recently.  Laboratory testing obtained by Dr. Epimenio Foot for a platelet count of 4.  She developed petechiae and ecchymosis and was referred to the emergency department.  Repeat laboratory testing on 11/09/2022.  A hemoglobin of 11.8 and platelet count of less than 5.  Her white cell count was normal at 5.5.  She was started on oral prednisone and admitted overnight.  Clinically, she reports no major complaints.  He does report bruising and petechiae but no active bleeding.  She denies hematochezia, hemoptysis or hematemesis.  She does not report any headaches, blurry vision, syncope or seizures. Does not report any fevers, chills or sweats.  Does not report any cough, wheezing or hemoptysis.  Does not report any chest pain, palpitation, orthopnea or leg edema.  Does not report any nausea, vomiting or abdominal pain.  Does not report any constipation or diarrhea.  Does not report any skeletal complaints.    Does not report frequency, urgency or hematuria.  Does not report any skin rashes or lesions. Does not report any heat or cold intolerance.  Does not report any lymphadenopathy or petechiae.  Does not report any anxiety or depression.  Remaining review of systems is negative.     Past Medical History:  Diagnosis Date   Allergies    Anxiety    Arthritis    Asthma    seasonal - pollen   Constipation    Depression    Dyspnea    with exertion   GERD (gastroesophageal reflux disease)    Headache    History of hiatal hernia    Left radial nerve palsy 03/15/2020   resolved - Left arm/hand weak   Multiple sclerosis (HCC)    Pneumonia 2007   x 1   Vision  abnormalities    Wears glasses   :   Past Surgical History:  Procedure Laterality Date   EXCISION MORTON'S NEUROMA Right    fallopian tube removal     INTRATHECAL PUMP IMPLANT Right 02/18/2019   Procedure: INTRATHECAL PUMP IMPLANT;  Surgeon: Odette Fraction, MD;  Location: [facility] OR;  Service: Neurosurgery;  Laterality: Right;  INTRATHECAL PUMP IMPLANT   INTRATHECAL PUMP IMPLANT Right 02/17/2021   Procedure: Baclofen Pump Replacement;  Surgeon: Maeola Harman, MD;  Location: [facility] OR;  Service: Neurosurgery;  Laterality: Right;  3C/RM 21   PAIN PUMP IMPLANTATION N/A 09/28/2021   Procedure: Insertion of baclofen pump, right lower quadrant;  Surgeon: Dawley, Alan Mulder, DO;  Location: [facility] OR;  Service: Neurosurgery;  Laterality: N/A;   PAIN PUMP REMOVAL Left 04/06/2021   Procedure: Removal of baclofen pump, Left;  Surgeon: Dawley, Alan Mulder, DO;  Location: [facility] OR;  Service: Neurosurgery;  Laterality: Left;  Lateral/Left//3C rm 19   UPPER GI ENDOSCOPY     WISDOM TOOTH EXTRACTION     WISDOM TOOTH EXTRACTION    :   Current Facility-Administered Medications:    albuterol (PROVENTIL) (2.5 MG/3ML) 0.083% nebulizer solution 2.5 mg, 2.5 mg, Inhalation, Q6H PRN, Mikeal Hawthorne, Mohammad L, MD   baclofen (LIORESAL) tablet 40 mg, 40 mg, Oral, QID, Earlie Lou L, MD, 40 mg at 11/08/21 2120  buPROPion (WELLBUTRIN XL) 24 hr tablet 300 mg, 300 mg, Oral, Daily, Garba, Mohammad L, MD   cholecalciferol (VITAMIN D3) tablet 5,000 Units, 5,000 Units, Oral, Daily, Garba, Mohammad L, MD   darifenacin (ENABLEX) 24 hr tablet 7.5 mg, 7.5 mg, Oral, Daily, Garba, Mohammad L, MD   dextrose 5 %-0.45 % sodium chloride infusion, , Intravenous, Continuous, Rometta Emery, MD, Last Rate: 50 mL/hr at 11/08/21 2122, New Bag at 11/08/21 2122   diazepam (VALIUM) tablet 7.5 mg, 7.5 mg, Oral, Q8H, Garba, Mohammad L, MD, 7.5 mg at 11/08/21 2120   famotidine (PEPCID) 40 MG/5ML suspension 10.4 mg, 10.4 mg, Oral, Daily, Garba, Mohammad L, MD    montelukast (SINGULAIR) tablet 10 mg, 10 mg, Oral, QPC breakfast, Garba, Mohammad L, MD   ondansetron (ZOFRAN) tablet 4 mg, 4 mg, Oral, Q6H PRN **OR** ondansetron (ZOFRAN) injection 4 mg, 4 mg, Intravenous, Q6H PRN, Mikeal Hawthorne, Mohammad L, MD   oxyCODONE (Oxy IR/ROXICODONE) immediate release tablet 10 mg, 10 mg, Oral, Q4H PRN, Mikeal Hawthorne, Mohammad L, MD, 10 mg at 11/09/21 0237   oxymetazoline (AFRIN) 0.05 % nasal spray 1 spray, 1 spray, Each Nare, BID PRN, Mikeal Hawthorne, Mohammad L, MD   polyethylene glycol (MIRALAX / GLYCOLAX) packet 17 g, 17 g, Oral, Daily, Garba, Mohammad L, MD   polyvinyl alcohol (LIQUIFILM TEARS) 1.4 % ophthalmic solution 1 drop, 1 drop, Both Eyes, TID PRN, Mikeal Hawthorne, Mohammad L, MD   predniSONE (DELTASONE) tablet 60 mg, 60 mg, Oral, BID WC, Garba, Mohammad L, MD   senna-docusate (Senokot-S) tablet 2 tablet, 2 tablet, Oral, Daily, Garba, Mohammad L, MD   tiZANidine (ZANAFLEX) tablet 8 mg, 8 mg, Oral, TID, Mikeal Hawthorne, Mohammad L, MD, 8 mg at 11/08/21 2120   vitamin B-12 (CYANOCOBALAMIN) tablet 2,500 mcg, 2,500 mcg, Oral, Daily, Mikeal Hawthorne, Mohammad L, MD:   Allergies  Allergen Reactions   Ziconotide Acetate Shortness Of Breath    Anorexia, weird sensations, could not talk, choking feeling, lost since of taste and smell. Hallucinations, vivid dreams. Confusion and sleepiness. N/v, increase in pain.  Anorexia, weird sensations, could not talk, choking feeling, lost since of taste and smell. Hallucinations, vivid dreams. Confusion and sleepiness. N/v, increase in pain.    Morphine Other (See Comments)    Pt does not recall reaction    Amoxicillin Nausea And Vomiting   Morphine Sulfate Other (See Comments)    Unknown   Pregabalin Nausea And Vomiting  :   Family History  Problem Relation Age of Onset   Diabetes Mother    Healthy Brother   :   Social History   Socioeconomic History   Marital status: Married    Spouse name: Not on file   Number of children: Not on file   Years of education: Not  on file   Highest education level: Not on file  Occupational History   Not on file  Tobacco Use   Smoking status: Every Day    Packs/day: 0.50    Years: 32.00    Total pack years: 16.00    Types: Cigarettes    Start date: 1988   Smokeless tobacco: Never  Vaping Use   Vaping Use: Former   Quit date: 05/29/2011  Substance and Sexual Activity   Alcohol use: Not Currently    Comment: 2-3 times per wk   Drug use: Never   Sexual activity: Not Currently    Birth control/protection: Post-menopausal  Other Topics Concern   Not on file  Social History Narrative   Right handed  Caffeine: 1 cup or less per day- coffee   [employer]   Social Determinants of Health   Financial Resource Strain: Not on file  Food Insecurity: No Food Insecurity (04/03/2019)   Hunger Vital Sign    Worried About Running Out of Food in the Last Year: Never true    Ran Out of Food in the Last Year: Never true  Transportation Needs: No Transportation Needs (04/03/2019)   PRAPARE - Administrator, Civil Service (Medical): No    Lack of Transportation (Non-Medical): No  Physical Activity: Inactive (04/03/2019)   Exercise Vital Sign    Days of Exercise per Week: 0 days    Minutes of Exercise per Session: 0 min  Stress: No Stress Concern Present (04/03/2019)   [employer] of Occupational Health - Occupational Stress Questionnaire    Feeling of Stress : Not at all  Social Connections: Not on file  Intimate Partner Violence: Not At Risk (04/03/2019)   Humiliation, Afraid, Rape, and Kick questionnaire    Fear of Current or Ex-Partner: No    Emotionally Abused: No    Physically Abused: No    Sexually Abused: No  :  Pertinent items are noted in HPI.  Exam: Blood pressure 112/73, pulse 94, temperature 98 F (36.7 C), temperature source Oral, resp. rate 18, height 5\' 9"  (1.753 m), weight 129 lb 15.7 oz (59 kg), SpO2 100 %. General appearance: alert and cooperative appeared without  distress. Head: atraumatic without any abnormalities. Eyes: conjunctivae/corneas clear. PERRL.  Sclera anicteric. Throat: lips, mucosa, and tongue normal; without oral thrush or ulcers. Resp: clear to auscultation bilaterally without rhonchi, wheezes or dullness to percussion. Cardio: regular rate and rhythm, S1, S2 normal, no murmur, click, rub or gallop GI: soft, non-tender; bowel sounds normal; no masses,  no organomegaly Skin: Ecchymosis noted on her upper extremity.  Petechial rash noted on her lower extremity bilaterally. Lymph nodes: Cervical, supraclavicular, and axillary nodes normal. Neurologic: Grossly normal without any motor, sensory or deep tendon reflexes. Musculoskeletal: No joint deformity or effusion.   Recent Labs    11/08/21 1603 11/09/21 0339  WBC 5.5 7.7  HGB 11.8* 12.0  HCT 35.7* 36.7  PLT <5* <5*    Recent Labs    11/08/21 1603 11/09/21 0339  NA 140 141  K 3.8 4.4  CL 108 109  CO2 28 26  GLUCOSE 144* 144*  BUN 12 12  CREATININE 0.62 0.65  CALCIUM 9.4 9.4     Blood smear review: Decreased platelets with polychromasia as well as hypersegmented neutrophils noted.  No schistocytes or red cell fragments.  Assessment and Plan:   51 year old woman with:  1.  Thrombocytopenia presented acutely with a platelet count of 134 on May 4 and currently less than 5.  She has normal hemoglobin without any evidence of microangiopathy.  The differential diagnosis management choices were discussed at this time.  Acute therapy remains the most likely etiology with there is related to alemtuzumab or idiopathic remains unknown at this time.  TTP, HUS, DIC among others are considered extremely unlikely.   From a management standpoint I agree with that prednisone 1 mg/kg daily without any need for high-dose Solu-Medrol at this time.  Given the severity of her thrombocytopenia and the need for accelerated recovery I will add IVIG.  Complication associated with this  treatment including infusion related issues pruritus, hives and rarely anaphylaxis.  Volume overload is also a possibility.  She will receive 1 g/kg for 2  consecutive days.  We will continue to monitor platelets daily.   2.  MS: Managed by neurology at this time.  Her condition appears to be stable.  3.  Disposition: She will remain hospitalized for at least platelet recovery is noted.  We will arrange outpatient follow-up after that.  80  minutes were dedicated to this visit.  More than 50% of the time was face-to-face.  The time was spent on reviewing laboratory data, discussing treatment options, discussing differential diagnosis and answering questions regarding future plan including discussion with different consultants and specialists.     A copy of this consult has been forwarded to the requesting physician.

## 2021-11-10 ENCOUNTER — Other Ambulatory Visit: Payer: Self-pay | Admitting: Oncology

## 2021-11-10 DIAGNOSIS — D696 Thrombocytopenia, unspecified: Secondary | ICD-10-CM | POA: Diagnosis not present

## 2021-11-10 DIAGNOSIS — D693 Immune thrombocytopenic purpura: Secondary | ICD-10-CM

## 2021-11-10 LAB — CBC
HCT: 31.7 % — ABNORMAL LOW (ref 36.0–46.0)
Hemoglobin: 10.3 g/dL — ABNORMAL LOW (ref 12.0–15.0)
MCH: 31.7 pg (ref 26.0–34.0)
MCHC: 32.5 g/dL (ref 30.0–36.0)
MCV: 97.5 fL (ref 80.0–100.0)
Platelets: 64 10*3/uL — ABNORMAL LOW (ref 150–400)
RBC: 3.25 MIL/uL — ABNORMAL LOW (ref 3.87–5.11)
RDW: 13.8 % (ref 11.5–15.5)
WBC: 8.8 10*3/uL (ref 4.0–10.5)
nRBC: 0 % (ref 0.0–0.2)

## 2021-11-10 MED ORDER — DARIFENACIN HYDROBROMIDE ER 7.5 MG PO TB24
7.5000 mg | ORAL_TABLET | Freq: Two times a day (BID) | ORAL | Status: DC
  Administered 2021-11-10 – 2021-11-13 (×6): 7.5 mg via ORAL
  Filled 2021-11-10 (×7): qty 1

## 2021-11-10 MED ORDER — ACETAMINOPHEN 500 MG PO TABS
1000.0000 mg | ORAL_TABLET | Freq: Four times a day (QID) | ORAL | Status: DC | PRN
  Administered 2021-11-10 – 2021-11-13 (×10): 1000 mg via ORAL
  Filled 2021-11-10 (×10): qty 2

## 2021-11-10 MED ORDER — PSYLLIUM 95 % PO PACK
1.0000 | PACK | Freq: Every day | ORAL | Status: DC
  Administered 2021-11-11 – 2021-11-13 (×3): 1 via ORAL
  Filled 2021-11-10 (×3): qty 1

## 2021-11-10 NOTE — Plan of Care (Signed)

## 2021-11-10 NOTE — Progress Notes (Signed)
IP PROGRESS NOTE  Subjective:   No major complaints overnight.  She tolerated IVIG without any issues.  She denies any abdominal pain.  She denies any bleeding or any hematochezia, melena or epistaxis.  Her petechia is improved.  Objective:  Vital signs in last 24 hours: Temp:  [97.9 F (36.6 C)-98.4 F (36.9 C)] 97.9 F (36.6 C) (06/16 0627) Pulse Rate:  [72-97] 72 (06/16 0627) Resp:  [16-18] 16 (06/16 0627) BP: (100-116)/(62-76) 100/64 (06/16 0627) SpO2:  [94 %-100 %] 99 % (06/16 0627) Weight change:     Intake/Output from previous day: 06/15 0701 - 06/16 0700 In: 1751 [P.O.:820; I.V.:931] Out: 850 [Urine:850] General: Alert, awake without distress. Head: Normocephalic atraumatic. Mouth: mucous membranes moist, pharynx normal without lesions Eyes: No scleral icterus.  Pupils are equal and round reactive to light. Resp: clear to auscultation bilaterally without rhonchi or wheezes or dullness to percussion. Cardio: regular rate and rhythm, S1, S2 normal, no murmur, click, rub or gallop GI: soft, non-tender; bowel sounds normal; no masses,  no organomegaly Musculoskeletal: No joint deformity or effusion. Neurological: No motor, sensory deficits.  Intact deep tendon reflexes. Skin: Ecchymosis still noted on her upper extremity.  Petechial rash is fading on her lower extremity.   Lab Results: Recent Labs    11/09/21 0339 11/10/21 0311  WBC 7.7 8.8  HGB 12.0 10.3*  HCT 36.7 31.7*  PLT <5* 64*    BMET Recent Labs    11/08/21 1603 11/09/21 0339  NA 140 141  K 3.8 4.4  CL 108 109  CO2 28 26  GLUCOSE 144* 144*  BUN 12 12  CREATININE 0.62 0.65  CALCIUM 9.4 9.4    Studies/Results: No results found.  Medications: I have reviewed the patient's current medications.  Assessment/Plan:  51 year old with:  1.  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  Her platelet count responded to steroids and IVIG with current  count up to 64 without any active bleeding.  I have recommended proceeding with day 2 of IVIG to ensure durability and stability of her platelet recovery.  Upon completing IVIG without complications, she will be continued on prednisone 60 mg daily since she is discharged.  Prednisone taper will start as an outpatient once her platelet count has normalized.  2.  Multiple sclerosis: She follows with neurology recommended physical therapy while she is hospitalized.  3.  Disposition and follow-up: I have no objections to discharge after completing IVIG in the next 24 to 48 hours.  She will be discharged on 60 mg of prednisone daily and we will taper her prednisone as an outpatient.  35  minutes were dedicated to this visit.  50% of the time was face-to-face.  The time was spent on updating her disease status, treatment choices and answering question upon suture management    LOS: 1 day   Eli Hose 11/10/2021, 7:07 AM

## 2021-11-10 NOTE — Progress Notes (Signed)
PROGRESS NOTE    Sharon Odom  DGU:[id number] DOB: 1971-04-06 DOA: 11/08/2021  PCP: Sharon March, NP-C   Brief Narrative:  This 51 years old female with PMH significant for multiple sclerosis, GERD, visual abnormalities, anxiety disorder, asthma, depression, chronic pain syndrome with intrathecal pump implant, who was sent to the ED by her neurologist secondary to low platelets.  Patient was apparently on Lemtrada for her multiple sclerosis.  Patient has received that medication for almost 4 years.  She was due for her blood work.  She went for outpatient follow-up and she was found to have platelet count of 4000.  Her platelets were 134K one month ago.  The medication was held and patient was sent in the ED. Patient was admitted for likely drug induced thrombocytopenia.  Hematology was consulted. Patient was started on prednisone and IVIG treatment. Platelet count has improved with a dose of IVIG and prednisone.  Platelet count 64 K.   Assessment & Plan:   Principal Problem:   Thrombocytopenia (HCC) Active Problems:   Depression with anxiety   Asthma   Multiple sclerosis (HCC)   Urinary incontinence  Drug-induced thrombocytopenia: Patient had critically low platelet count of 4K.  Last known platelet was 134 K last month. Patient has been receiving Lemtrada for multiple sclerosis for last 4 years. Patient was evaluated by Dr. Clelia Croft who recommended prednisone and IVIG treatment. [name] has been discontinued.  Continue to monitor platelet count. Patient has multiple bruises but no obvious visible bleeding noted. Platelet count has improved after dose of IVIG and prednisone. Continue 2 doses of IVIG, and prednisone 60 mg daily with plan to taper prednisone as an outpatient.  Multiple sclerosis: Patient will follow-up with Neurology for further new medications. MS seems to be in a stable condition in remission. Patient has advanced disease with contractures and  neuropathy. Continue baclofen and tizanidine.  Anxiety disorder: Continue Wellbutrin 300 mg daily  and diazepam 7.5 mg q 8hrs  Urinary incontinence : Continue Enablex 7.5 mg daily.  History of asthma: Continue home inhalers,  not in any acute exacerbation.   DVT prophylaxis: SCDs Code Status: Full code. Family Communication: No family at bed side. Disposition Plan:   Status is: Inpatient  Remains inpatient appropriate because:    Admitted for drug-induced thrombocytopenia with a platelet count of 4K requiring IVIG and prednisone.  Anticipated discharge home in 1 to 2 days.  Home Health services arranged.   Consultants:  Hematology   Procedures: None  Antimicrobials: None   Subjective: Patient was seen and examined at bedside.  Overnight events noted. Patient reports feeling better.  She denies any bleeding, reports bruising has improved. Her platelet count has improved to 64K, tolerated IVIG without any side effects.   Objective: Vitals:   11/09/21 2115 11/10/21 0627 11/10/21 1005 11/10/21 1021  BP: 114/65 100/64 102/65 104/64  Pulse: 93 72 74 70  Resp: 18 16 16 14   Temp: 98.3 F (36.8 C) 97.9 F (36.6 C) 97.8 F (36.6 C) 97.7 F (36.5 C)  TempSrc: Oral Oral Oral Oral  SpO2: 98% 99% 99% 98%  Weight:      Height:        Intake/Output Summary (Last 24 hours) at 11/10/2021 1133 Last data filed at 11/10/2021 0800 Gross per 24 hour  Intake 1631 ml  Output 1750 ml  Net -119 ml   Filed Weights   11/08/21 2000  Weight: 59 kg    Examination:  General exam: Appears comfortable, not in any  acute distress, deconditioned. Respiratory system: CTA bilaterally, no wheezing, no crackles, normal respiratory effort. Cardiovascular system: S1-S2 heard, regular rate and rhythm, no murmur. Gastrointestinal system: Abdomen is soft, non tender, non distended, BS +. Central nervous system: Alert and oriented x3.  No focal neurological deficits. Extremities: No edema,  no cyanosis, no clubbing.  Multiple contractures. Skin: Multiple bruises and petechiae all over the body. Psychiatry: Judgement and insight appear normal. Mood & affect appropriate.     Data Reviewed: I have personally reviewed following labs and imaging studies  CBC: Recent Labs  Lab 11/08/21 1603 11/09/21 0339 11/10/21 0311  WBC 5.5 7.7 8.8  NEUTROABS 3.3  --   --   HGB 11.8* 12.0 10.3*  HCT 35.7* 36.7 31.7*  MCV 96.7 95.8 97.5  PLT <5* <5* 64*   Basic Metabolic Panel: Recent Labs  Lab 11/08/21 1603 11/09/21 0339  NA 140 141  K 3.8 4.4  CL 108 109  CO2 28 26  GLUCOSE 144* 144*  BUN 12 12  CREATININE 0.62 0.65  CALCIUM 9.4 9.4   GFR: Estimated Creatinine Clearance: 79.2 mL/min (by C-G formula based on SCr of 0.65 mg/dL). Liver Function Tests: Recent Labs  Lab 11/09/21 0339  AST 21  ALT 24  ALKPHOS 68  BILITOT 0.6  PROT 6.9  ALBUMIN 3.7   No results for input(s): "LIPASE", "AMYLASE" in the last 168 hours. No results for input(s): "AMMONIA" in the last 168 hours. Coagulation Profile: No results for input(s): "INR", "PROTIME" in the last 168 hours. Cardiac Enzymes: No results for input(s): "CKTOTAL", "CKMB", "CKMBINDEX", "TROPONINI" in the last 168 hours. BNP (last 3 results) No results for input(s): "PROBNP" in the last 8760 hours. HbA1C: No results for input(s): "HGBA1C" in the last 72 hours. CBG: No results for input(s): "GLUCAP" in the last 168 hours. Lipid Profile: No results for input(s): "CHOL", "HDL", "LDLCALC", "TRIG", "CHOLHDL", "LDLDIRECT" in the last 72 hours. Thyroid Function Tests: No results for input(s): "TSH", "T4TOTAL", "FREET4", "T3FREE", "THYROIDAB" in the last 72 hours. Anemia Panel: No results for input(s): "VITAMINB12", "FOLATE", "FERRITIN", "TIBC", "IRON", "RETICCTPCT" in the last 72 hours. Sepsis Labs: No results for input(s): "PROCALCITON", "LATICACIDVEN" in the last 168 hours.  No results found for this or any previous  visit (from the past 240 hour(s)).   Radiology Studies: No results found.  Scheduled Meds:  baclofen  40 mg Oral QID   buPROPion  300 mg Oral Daily   cholecalciferol  5,000 Units Oral Daily   dalfampridine  10 mg Oral BID   darifenacin  7.5 mg Oral Daily   diazepam  7.5 mg Oral Q8H   montelukast  10 mg Oral QPC breakfast   pantoprazole  20 mg Oral Daily   polyethylene glycol  17 g Oral Daily   predniSONE  60 mg Oral BID WC   senna-docusate  2 tablet Oral Daily   tiZANidine  8 mg Oral TID   vitamin B-12  2,500 mcg Oral Daily   Continuous Infusions:  dextrose 5 % and 0.45% NaCl 50 mL/hr at 11/09/21 1613     LOS: 1 day    Time spent: 35 mins    Emaree Chiu, MD [facility]   If 7PM-7AM, please contact night-coverage

## 2021-11-10 NOTE — Progress Notes (Signed)
OT Cancellation Note  Patient Details Name: Sharon Odom MRN: [id number] DOB: 08-12-1970   Cancelled Treatment:    Reason Eval/Treat Not Completed: Other (comment). Patient eating and getting infusion. Will f/u as able.   Shaelin Lalley L Shanara Schnieders 11/10/2021, 3:11 PM

## 2021-11-10 NOTE — Plan of Care (Signed)
  Problem: Education: Goal: Knowledge of General Education information will improve Description: Including pain rating scale, medication(s)/side effects and non-pharmacologic comfort measures Outcome: Adequate for Discharge   Problem: Nutrition: Goal: Adequate nutrition will be maintained Outcome: Completed/Met   Problem: Elimination: Goal: Will not experience complications related to urinary retention Outcome: Completed/Met   Problem: Pain Managment: Goal: General experience of comfort will improve Outcome: Progressing

## 2021-11-11 DIAGNOSIS — D696 Thrombocytopenia, unspecified: Secondary | ICD-10-CM | POA: Diagnosis not present

## 2021-11-11 LAB — CBC
HCT: 31.4 % — ABNORMAL LOW (ref 36.0–46.0)
Hemoglobin: 10 g/dL — ABNORMAL LOW (ref 12.0–15.0)
MCH: 31.5 pg (ref 26.0–34.0)
MCHC: 31.8 g/dL (ref 30.0–36.0)
MCV: 99.1 fL (ref 80.0–100.0)
Platelets: 135 10*3/uL — ABNORMAL LOW (ref 150–400)
RBC: 3.17 MIL/uL — ABNORMAL LOW (ref 3.87–5.11)
RDW: 14.1 % (ref 11.5–15.5)
WBC: 8.5 10*3/uL (ref 4.0–10.5)
nRBC: 0 % (ref 0.0–0.2)

## 2021-11-11 NOTE — Evaluation (Signed)
Physical Therapy Evaluation Patient Details Name: Sharon Odom MRN: [id number] DOB: 09-Nov-1970 Today's Date: 11/11/2021  History of Present Illness  Pt admitted from home 2* thrombocyopenia and with hx of anxiety, MS and chronic pain with intrathecal Baclofen pump placement.  Clinical Impression  Sharon Odom is a 51 year old woman with above medical history. She reports being able to use rollator and being completely independent with ADLs in May but has had a recent decline since May since new baclofen pump was placed. She has been receiving HH OT and PT. At home she was grossly independent with ADLs from wheelchair level and able to transfer herself. On evaluation she is near her pre-hospitalized baseline. IN hospital she was supervision for bed mobility using a leg lifter and min assist of two to stand and transfer to recliner with walker. Pt would benefit from continued HHPT to further address deficits and maximize IND and safety.     Recommendations for follow up therapy are one component of a multi-disciplinary discharge planning process, led by the attending physician.  Recommendations may be updated based on patient status, additional functional criteria and insurance authorization.  Follow Up Recommendations Home health PT    Assistance Recommended at Discharge Frequent or constant Supervision/Assistance  Patient can return home with the following  A little help with walking and/or transfers;A little help with bathing/dressing/bathroom;Assistance with cooking/housework;Assist for transportation;Help with stairs or ramp for entrance    Equipment Recommendations None recommended by PT  Recommendations for Other Services       Functional Status Assessment Patient has had a recent decline in their functional status and demonstrates the ability to make significant improvements in function in a reasonable and predictable amount of time.     Precautions / Restrictions  Precautions Precautions: Fall Precaution Comments: baclofen pump Restrictions Weight Bearing Restrictions: No      Mobility  Bed Mobility Overal bed mobility: Needs Assistance Bed Mobility: Supine to Sit     Supine to sit: Supervision, HOB elevated     General bed mobility comments: increased time with assist of UEs and leg lifter    Transfers Overall transfer level: Needs assistance Equipment used: Rolling walker (2 wheels) Transfers: Sit to/from Stand, Bed to chair/wheelchair/BSC Sit to Stand: Min assist, +2 physical assistance, +2 safety/equipment   Step pivot transfers: Min assist, +2 physical assistance, +2 safety/equipment       General transfer comment: Increased time with assist to bring wt up and fwd, to balance in initial standing and to lock L knee.  Step pvt bed to recliner but assist required to advance L LE and for balance/support    Ambulation/Gait               General Gait Details: Step pvt bed to chair only  Stairs            Wheelchair Mobility    Modified Rankin (Stroke Patients Only)       Balance Overall balance assessment: Needs assistance Sitting-balance support: No upper extremity supported Sitting balance-Leahy Scale: Fair     Standing balance support: Bilateral upper extremity supported, Reliant on assistive device for balance Standing balance-Leahy Scale: Poor                               Pertinent Vitals/Pain Pain Assessment Pain Assessment: No/denies pain    Home Living Family/patient expects to be discharged to:: Private residence Living Arrangements: Spouse/significant other Available  Help at Discharge: Family;Available PRN/intermittently Type of Home: House Home Access: Other (comment)     Alternate Level Stairs-Number of Steps: Flight Home Layout: Two level;Able to live on main level with bedroom/bathroom Home Equipment: Grab bars - tub/shower;Grab bars - toilet;Rolling Walker (2  wheels);Rollator (4 wheels);Wheelchair - manual;Shower seat - built in;Cane - single point Additional Comments: spouse works during the day, has been getting HH OT/PT    Prior Function Prior Level of Function : Needs assist       Physical Assist : Mobility (physical);ADLs (physical)     Mobility Comments: can transfer herself, was walking household distances with Rollator before May 2 (once pump put back in she "went down hill") ADLs Comments: independent with BADLs at wc level     Hand Dominance   Dominant Hand: Right    Extremity/Trunk Assessment   Upper Extremity Assessment Upper Extremity Assessment: RUE deficits/detail;LUE deficits/detail RUE Deficits / Details: WFL ROM and strength RUE Sensation: WNL RUE Coordination: WNL LUE Deficits / Details: Decreased ROM and motor control, can use left hand as a gross assist. Difficulty with releaseing LUE Coordination: decreased fine motor;decreased gross motor    Lower Extremity Assessment Lower Extremity Assessment: Defer to PT evaluation RLE Deficits / Details: 3+/5 strength wtih ROM WFL LLE Deficits / Details: min strength ankle and knee and hip with pt stating "its just dead"    Cervical / Trunk Assessment Cervical / Trunk Assessment: Normal  Communication   Communication: Other (comment) (soft spoken with slurred words)  Cognition Arousal/Alertness: Awake/alert Behavior During Therapy: WFL for tasks assessed/performed Overall Cognitive Status: Within Functional Limits for tasks assessed                                          General Comments      Exercises     Assessment/Plan    PT Assessment Patient needs continued PT services  PT Problem List Decreased strength;Decreased activity tolerance;Decreased balance;Decreased mobility;Decreased coordination;Decreased knowledge of use of DME       PT Treatment Interventions DME instruction;Gait training;Functional mobility training;Therapeutic  exercise;Therapeutic activities;Patient/family education;Balance training    PT Goals (Current goals can be found in the Care Plan section)  Acute Rehab PT Goals Patient Stated Goal: Regain IND PT Goal Formulation: With patient Time For Goal Achievement: 11/24/21 Potential to Achieve Goals: Fair    Frequency Min 3X/week     Co-evaluation PT/OT/SLP Co-Evaluation/Treatment: Yes Reason for Co-Treatment: For patient/therapist safety PT goals addressed during session: Mobility/safety with mobility OT goals addressed during session: ADL's and self-care       AM-PAC PT "6 Clicks" Mobility  Outcome Measure Help needed turning from your back to your side while in a flat bed without using bedrails?: A Little Help needed moving from lying on your back to sitting on the side of a flat bed without using bedrails?: A Little Help needed moving to and from a bed to a chair (including a wheelchair)?: A Lot Help needed standing up from a chair using your arms (e.g., wheelchair or bedside chair)?: A Lot Help needed to walk in hospital room?: Total Help needed climbing 3-5 steps with a railing? : Total 6 Click Score: 12    End of Session Equipment Utilized During Treatment: Gait belt Activity Tolerance: Patient tolerated treatment well;Patient limited by fatigue Patient left: in chair;with call bell/phone within reach;with chair alarm set Nurse Communication: Mobility  status PT Visit Diagnosis: Unsteadiness on feet (R26.81);Muscle weakness (generalized) (M62.81);Difficulty in walking, not elsewhere classified (R26.2)    Time: 6720-9470 PT Time Calculation (min) (ACUTE ONLY): 37 min   Charges:   PT Evaluation $PT Eval Low Complexity: 1 Low          Mauro Kaufmann PT Acute Rehabilitation Services Pager [phone] Office [phone]   Lovena Kluck 11/11/2021, 4:51 PM

## 2021-11-11 NOTE — Progress Notes (Signed)
PROGRESS NOTE    Sharon Odom  YSA:[id number] DOB: 1970/09/13 DOA: 11/08/2021  PCP: Charisse March, NP-C   Brief Narrative:  This 51 years old female with PMH significant for multiple sclerosis, GERD, visual abnormalities, anxiety disorder, asthma, depression, chronic pain syndrome with intrathecal pump implant, who was sent to the ED by her neurologist secondary to low platelets.  Patient was apparently on Lemtrada for her multiple sclerosis.  Patient has received that medication for almost 4 years.  She was due for her blood work.  She went for outpatient follow-up and she was found to have platelet count of 4000.  Her platelets were 134K one month ago.  The medication was held and patient was sent in the ED. Patient was admitted for likely drug induced thrombocytopenia.  Hematology was consulted. Patient was started on prednisone and IVIG treatment. Platelet count has improved with a dose of IVIG and prednisone Patient's husband has left out of town and she does not have a ride nor family around so she will not be able to d/c until he returns.   Assessment & Plan:   Principal Problem:   Thrombocytopenia (HCC) Active Problems:   Depression with anxiety   Asthma   Multiple sclerosis (HCC)   Urinary incontinence  Drug-induced thrombocytopenia: Patient had critically low platelet count of 4K.  Last known platelet was 134 K last month. Patient has been receiving Lemtrada for multiple sclerosis for last 4 years. Patient was evaluated by Dr. Clelia Croft who recommended prednisone and IVIG treatment. [name] has been discontinued.  Continue to monitor platelet count. -2 doses of IVIG, and prednisone 60 mg daily with plan to taper prednisone as an outpatient.  Multiple sclerosis: Patient will follow-up with Neurology for further new medications. MS seems to be in a stable condition in remission. Patient has advanced disease with contractures and neuropathy. Continue baclofen and  tizanidine.  Anxiety disorder: Continue Wellbutrin 300 mg daily  and diazepam 7.5 mg q 8hrs  Urinary incontinence : Continue Enablex 7.5 mg daily.  History of asthma: Continue home inhalers,  not in any acute exacerbation.  PT/OT eval  DVT prophylaxis: SCDs Code Status: Full code. Family Communication: No family at bed side as husband has left town Disposition Plan: when husband returns     Consultants:  Hematology   Procedures: None  Antimicrobials: None   Subjective: Says her husband is out of town, says she told both Dr. Lucianne Muss and Dr. Clelia Croft that and was told she could remain in hospital until he returns   Objective: Vitals:   11/10/21 1300 11/10/21 1400 11/10/21 2121 11/11/21 0604  BP: 109/68 102/68 117/68 119/76  Pulse: 80 79 83 69  Resp: 16  16 17   Temp: 98 F (36.7 C)  98.3 F (36.8 C) 98.1 F (36.7 C)  TempSrc: Oral  Oral Oral  SpO2: 93% 95% 100% 99%  Weight:      Height:        Intake/Output Summary (Last 24 hours) at 11/11/2021 0800 Last data filed at 11/11/2021 0730 Gross per 24 hour  Intake 1350 ml  Output 4100 ml  Net -2750 ml   Filed Weights   11/08/21 2000  Weight: 59 kg    Examination:  In bed NAD Rrr No increase work of breathing    Data Reviewed: I have personally reviewed following labs and imaging studies  CBC: Recent Labs  Lab 11/08/21 1603 11/09/21 0339 11/10/21 0311 11/11/21 0307  WBC 5.5 7.7 8.8 8.5  NEUTROABS 3.3  --   --   --  HGB 11.8* 12.0 10.3* 10.0*  HCT 35.7* 36.7 31.7* 31.4*  MCV 96.7 95.8 97.5 99.1  PLT <5* <5* 64* 135*   Basic Metabolic Panel: Recent Labs  Lab 11/08/21 1603 11/09/21 0339  NA 140 141  K 3.8 4.4  CL 108 109  CO2 28 26  GLUCOSE 144* 144*  BUN 12 12  CREATININE 0.62 0.65  CALCIUM 9.4 9.4   GFR: Estimated Creatinine Clearance: 79.2 mL/min (by C-G formula based on SCr of 0.65 mg/dL). Liver Function Tests: Recent Labs  Lab 11/09/21 0339  AST 21  ALT 24  ALKPHOS 68   BILITOT 0.6  PROT 6.9  ALBUMIN 3.7   No results for input(s): "LIPASE", "AMYLASE" in the last 168 hours. No results for input(s): "AMMONIA" in the last 168 hours. Coagulation Profile: No results for input(s): "INR", "PROTIME" in the last 168 hours. Cardiac Enzymes: No results for input(s): "CKTOTAL", "CKMB", "CKMBINDEX", "TROPONINI" in the last 168 hours. BNP (last 3 results) No results for input(s): "PROBNP" in the last 8760 hours. HbA1C: No results for input(s): "HGBA1C" in the last 72 hours. CBG: No results for input(s): "GLUCAP" in the last 168 hours. Lipid Profile: No results for input(s): "CHOL", "HDL", "LDLCALC", "TRIG", "CHOLHDL", "LDLDIRECT" in the last 72 hours. Thyroid Function Tests: No results for input(s): "TSH", "T4TOTAL", "FREET4", "T3FREE", "THYROIDAB" in the last 72 hours. Anemia Panel: No results for input(s): "VITAMINB12", "FOLATE", "FERRITIN", "TIBC", "IRON", "RETICCTPCT" in the last 72 hours. Sepsis Labs: No results for input(s): "PROCALCITON", "LATICACIDVEN" in the last 168 hours.  No results found for this or any previous visit (from the past 240 hour(s)).   Radiology Studies: No results found.  Scheduled Meds:  baclofen  40 mg Oral QID   buPROPion  300 mg Oral Daily   cholecalciferol  5,000 Units Oral Daily   dalfampridine  10 mg Oral BID   darifenacin  7.5 mg Oral BID   diazepam  7.5 mg Oral Q8H   montelukast  10 mg Oral QPC breakfast   pantoprazole  20 mg Oral Daily   polyethylene glycol  17 g Oral Daily   predniSONE  60 mg Oral BID WC   psyllium  1 packet Oral Daily   senna-docusate  2 tablet Oral Daily   tiZANidine  8 mg Oral TID   vitamin B-12  2,500 mcg Oral Daily   Continuous Infusions:     LOS: 2 days    Time spent: 25 mins    Joseph Art, DO [facility]   If 7PM-7AM, please contact night-coverage

## 2021-11-11 NOTE — TOC Transition Note (Deleted)
Transition of Care [facility]) - CM/SW Discharge Note   Patient Details  Name: Sharon Odom MRN: [id number] Date of Birth: Jul 05, 1970  [facility] CM/SW Contact:  Darleene Cleaver, LCSW Phone Number: 11/11/2021, 10:48 AM   Clinical Narrative:     Patient will be going home with home health through [facility].  CSW signing off please reconsult with any other social work needs, home health agency has been notified of planned discharge.    Final next level of care: Home w Home Health Services Barriers to Discharge: Barriers Resolved   Patient Goals and CMS Choice Patient states their goals for this hospitalization and ongoing recovery are:: To return back home. CMS Medicare.gov Compare Post Acute Care list provided to:: Patient Choice offered to / list presented to : Patient  Discharge Placement                       Discharge Plan and Services                          HH Arranged: PT, OT, Nurse's Aide HH Agency: [facility] Date [facility] Agency Contacted: 11/09/21 Time HH Agency Contacted: [phone]    Social Determinants of Health (SDOH) Interventions     Readmission Risk Interventions     No data to display

## 2021-11-11 NOTE — Evaluation (Signed)
Occupational Therapy Evaluation Patient Details Name: Sharon Odom MRN: [id number] DOB: 28-Feb-1971 Today's Date: 11/11/2021   History of Present Illness Pt admitted from home 2* thrombocyopenia and with hx of anxiety, MS and chronic pain with intrathecal Baclofen pump placement.   Clinical Impression   Mrs. Sharon Odom is a 51 year old woman with above medical history. She reports being able to use rollator and being completely independent with ADLs in May but has had a recent decline since May since new baclofen pump was placed. She has been receiving HH OT and PT. At home she was grossly independent with ADLs from wheelchair level and able to transfer herself. On evaluation she is near her pre-hospitalized baseline. IN hospital she was supervision for bed mobility using a leg lifter, min assist to stand and transfer to recliner with walker and needing increased assistance with ADLs due to not normal environment and setup. Recommend continue HH OT at discharge. No acute care OT needs.      Recommendations for follow up therapy are one component of a multi-disciplinary discharge planning process, led by the attending physician.  Recommendations may be updated based on patient status, additional functional criteria and insurance authorization.   Follow Up Recommendations  Home health OT    Assistance Recommended at Discharge Intermittent Supervision/Assistance  Patient can return home with the following A little help with bathing/dressing/bathroom;Assistance with cooking/housework;Help with stairs or ramp for entrance    Functional Status Assessment  Patient has had a recent decline in their functional status and/or demonstrates limited ability to make significant improvements in function in a reasonable and predictable amount of time  Equipment Recommendations  None recommended by OT    Recommendations for Other Services       Precautions / Restrictions Precautions Precautions:  Fall Precaution Comments: baclofen pump Restrictions Weight Bearing Restrictions: No      Mobility Bed Mobility Overal bed mobility: Needs Assistance Bed Mobility: Supine to Sit     Supine to sit: Supervision, HOB elevated     General bed mobility comments: increased time with use of leg lifter and hand holds    Transfers Overall transfer level: Needs assistance Equipment used: Rolling walker (2 wheels) Transfers: Sit to/from Stand, Bed to chair/wheelchair/BSC Sit to Stand: Min assist, +2 safety/equipment     Step pivot transfers: Min assist, +2 safety/equipment            Balance Overall balance assessment: Needs assistance Sitting-balance support: No upper extremity supported, Feet supported Sitting balance-Leahy Scale: Fair     Standing balance support: Reliant on assistive device for balance Standing balance-Leahy Scale: Poor                             ADL either performed or assessed with clinical judgement   ADL Overall ADL's : At baseline                                       General ADL Comments: At her recent baseline - able to perform gross BADLs at wheelchair level in home environment. Needs assistance for opening items, fasteners. Needs more assistance here due to unfamiliar environment, use of hospital bed, and external catheters.     Vision Baseline Vision/History: 1 Wears glasses       Perception     Praxis      Pertinent Vitals/Pain  Pain Assessment Pain Assessment: No/denies pain     Hand Dominance Right   Extremity/Trunk Assessment Upper Extremity Assessment Upper Extremity Assessment: RUE deficits/detail;LUE deficits/detail RUE Deficits / Details: WFL ROM and strength RUE Sensation: WNL RUE Coordination: WNL LUE Deficits / Details: Decreased ROM and motor control, can use left hand as a gross assist. Difficulty with releaseing LUE Coordination: decreased fine motor;decreased gross motor   Lower  Extremity Assessment Lower Extremity Assessment: Defer to PT evaluation   Cervical / Trunk Assessment Cervical / Trunk Assessment: Normal   Communication Communication Communication: Other (comment) (soft spoken with slurred words)   Cognition Arousal/Alertness: Awake/alert Behavior During Therapy: WFL for tasks assessed/performed Overall Cognitive Status: Within Functional Limits for tasks assessed                                       General Comments       Exercises     Shoulder Instructions      Home Living Family/patient expects to be discharged to:: Private residence Living Arrangements: Spouse/significant other Available Help at Discharge: Family;Available PRN/intermittently Type of Home: House Home Access: Other (comment)     Home Layout: Two level;Able to live on main level with bedroom/bathroom Alternate Level Stairs-Number of Steps: Flight   Bathroom Shower/Tub: Producer, television/film/video: Handicapped height Bathroom Accessibility: Yes How Accessible: Accessible via wheelchair;Accessible via walker Home Equipment: Grab bars - tub/shower;Grab bars - toilet;Rolling Walker (2 wheels);Rollator (4 wheels);Wheelchair - manual;Shower seat - built in;Cane - single point   Additional Comments: spouse works during the day, has been getting HH OT/PT      Prior Functioning/Environment Prior Level of Function : Needs assist       Physical Assist : Mobility (physical);ADLs (physical)     Mobility Comments: can transfer herself, was walking household distances with Rollator before May 2 (once pump put back in she "went down hill") ADLs Comments: independent with BADLs at wc level        OT Problem List: Decreased strength;Decreased range of motion;Impaired balance (sitting and/or standing);Decreased activity tolerance;Decreased cognition;Impaired UE functional use      OT Treatment/Interventions:      OT Goals(Current goals can be found  in the care plan section) Acute Rehab OT Goals OT Goal Formulation: All assessment and education complete, DC therapy  OT Frequency:      Co-evaluation PT/OT/SLP Co-Evaluation/Treatment: Yes Reason for Co-Treatment: For patient/therapist safety PT goals addressed during session: Mobility/safety with mobility OT goals addressed during session: ADL's and self-care      AM-PAC OT "6 Clicks" Daily Activity     Outcome Measure Help from another person eating meals?: A Little Help from another person taking care of personal grooming?: A Little Help from another person toileting, which includes using toliet, bedpan, or urinal?: Total Help from another person bathing (including washing, rinsing, drying)?: A Little Help from another person to put on and taking off regular upper body clothing?: A Little Help from another person to put on and taking off regular lower body clothing?: A Little 6 Click Score: 16   End of Session Equipment Utilized During Treatment: Rolling walker (2 wheels);Gait belt Nurse Communication: Mobility status  Activity Tolerance: Patient tolerated treatment well Patient left: in chair;with call bell/phone within reach;with chair alarm set  OT Visit Diagnosis: Muscle weakness (generalized) (M62.81)  Time: [id number] OT Time Calculation (min): 39 min Charges:  OT General Charges $OT Visit: 1 Visit OT Evaluation $OT Eval Low Complexity: 1 Low  Alaze Garverick, OTR/L [place]  Office [phone] Pager: [place] 11/11/2021, 4:16 PM

## 2021-11-11 NOTE — Plan of Care (Signed)

## 2021-11-11 NOTE — Plan of Care (Signed)
  Problem: Education: Goal: Knowledge of General Education information will improve Description: Including pain rating scale, medication(s)/side effects and non-pharmacologic comfort measures Outcome: Progressing   Problem: Pain Managment: Goal: General experience of comfort will improve Outcome: Progressing   Problem: Safety: Goal: Ability to remain free from injury will improve Outcome: Progressing   

## 2021-11-12 ENCOUNTER — Inpatient Hospital Stay (HOSPITAL_COMMUNITY): Payer: BLUE CROSS/BLUE SHIELD

## 2021-11-12 DIAGNOSIS — D696 Thrombocytopenia, unspecified: Secondary | ICD-10-CM | POA: Diagnosis not present

## 2021-11-12 LAB — CBC
HCT: 35.5 % — ABNORMAL LOW (ref 36.0–46.0)
Hemoglobin: 11.4 g/dL — ABNORMAL LOW (ref 12.0–15.0)
MCH: 31.8 pg (ref 26.0–34.0)
MCHC: 32.1 g/dL (ref 30.0–36.0)
MCV: 99.2 fL (ref 80.0–100.0)
Platelets: 228 10*3/uL (ref 150–400)
RBC: 3.58 MIL/uL — ABNORMAL LOW (ref 3.87–5.11)
RDW: 14.3 % (ref 11.5–15.5)
WBC: 12.5 10*3/uL — ABNORMAL HIGH (ref 4.0–10.5)
nRBC: 0 % (ref 0.0–0.2)

## 2021-11-12 MED ORDER — FENTANYL CITRATE PF 50 MCG/ML IJ SOSY
12.5000 ug | PREFILLED_SYRINGE | Freq: Once | INTRAMUSCULAR | Status: AC
  Administered 2021-11-12: 12.5 ug via INTRAVENOUS
  Filled 2021-11-12: qty 1

## 2021-11-12 MED ORDER — GADOBUTROL 1 MMOL/ML IV SOLN
6.0000 mL | Freq: Once | INTRAVENOUS | Status: AC | PRN
  Administered 2021-11-12: 6 mL via INTRAVENOUS

## 2021-11-12 MED ORDER — PREDNISONE 20 MG PO TABS
60.0000 mg | ORAL_TABLET | Freq: Every day | ORAL | Status: DC
  Administered 2021-11-13: 60 mg via ORAL
  Filled 2021-11-12: qty 3

## 2021-11-12 NOTE — Progress Notes (Signed)
Report received from day shift RN. Pt at MRI at this time. Pt up from MRI at 2055

## 2021-11-12 NOTE — Progress Notes (Signed)
PROGRESS NOTE    Sharon Odom  VZD:[id number] DOB: 06-30-1970 DOA: 11/08/2021  PCP: Charisse March, NP-C   Brief Narrative:  This 51 years old female with PMH significant for multiple sclerosis, GERD, visual abnormalities, anxiety disorder, asthma, depression, chronic pain syndrome with intrathecal pump implant, who was sent to the ED by her neurologist secondary to low platelets.  Patient was apparently on Lemtrada for her multiple sclerosis.  Patient has received that medication for almost 4 years.  She was due for her blood work.  She went for outpatient follow-up and she was found to have platelet count of 4000.  Her platelets were 134K one month ago.  The medication was held and patient was sent in the ED. Patient was admitted for likely drug induced thrombocytopenia.  Hematology was consulted. Patient was started on prednisone and IVIG treatment. Platelet count has improved with a dose of IVIG and prednisone Patient's husband has left out of town and she does not have a ride nor family around so she will not be able to d/c until he returns.   Assessment & Plan:   Principal Problem:   Thrombocytopenia (HCC) Active Problems:   Depression with anxiety   Asthma   Multiple sclerosis (HCC)   Urinary incontinence  Drug-induced thrombocytopenia: Patient had critically low platelet count of 4K.  Last known platelet was 134 K last month. Patient has been receiving Lemtrada for multiple sclerosis for last 4 years. Patient was evaluated by Dr. Clelia Croft who recommended prednisone and IVIG treatment. [name] has been discontinued.  Continue to monitor platelet count. -2 doses of IVIG, and prednisone 60 mg daily with plan to taper prednisone as an outpatient.  Multiple sclerosis: Patient will follow-up with Neurology for further new medications. Patient thinks she is having a flare- discussed with neuro, will get MRI brain, cervical/thoracic spine  Anxiety disorder: Continue Wellbutrin  300 mg daily  and diazepam 7.5 mg q 8hrs  Urinary incontinence : Continue Enablex 7.5 mg daily.  History of asthma: Continue home inhalers,  not in any acute exacerbation.  PT/OT eval- home health  DVT prophylaxis: SCDs Code Status: Full code. Family Communication: No family at bed side as husband has left town Disposition Plan: await MRI     Consultants:  Hematology  Neurology (phone)  Procedures: None  Antimicrobials: None   Subjective: Says she feels like she is having a flare of her MS- weaker on right side   Objective: Vitals:   11/11/21 0815 11/11/21 1323 11/11/21 2130 11/12/21 0654  BP: 116/82 119/74 115/76 115/69  Pulse: 88 83 83 90  Resp: 18 14 14 16   Temp: 98 F (36.7 C) 98 F (36.7 C) 98.7 F (37.1 C) 98 F (36.7 C)  TempSrc: Oral Oral Oral Oral  SpO2: 100% 100% 93% 100%  Weight:      Height:        Intake/Output Summary (Last 24 hours) at 11/12/2021 1254 Last data filed at 11/12/2021 0140 Gross per 24 hour  Intake 720 ml  Output 4500 ml  Net -3780 ml   Filed Weights   11/08/21 2000  Weight: 59 kg    Examination:  In bed, NAD Rrr Overall weakness- left > right with some spacticity     Data Reviewed: I have personally reviewed following labs and imaging studies  CBC: Recent Labs  Lab 11/08/21 1603 11/09/21 0339 11/10/21 0311 11/11/21 0307 11/12/21 1103  WBC 5.5 7.7 8.8 8.5 12.5*  NEUTROABS 3.3  --   --   --   --  HGB 11.8* 12.0 10.3* 10.0* 11.4*  HCT 35.7* 36.7 31.7* 31.4* 35.5*  MCV 96.7 95.8 97.5 99.1 99.2  PLT <5* <5* 64* 135* 228   Basic Metabolic Panel: Recent Labs  Lab 11/08/21 1603 11/09/21 0339  NA 140 141  K 3.8 4.4  CL 108 109  CO2 28 26  GLUCOSE 144* 144*  BUN 12 12  CREATININE 0.62 0.65  CALCIUM 9.4 9.4   GFR: Estimated Creatinine Clearance: 79.2 mL/min (by C-G formula based on SCr of 0.65 mg/dL). Liver Function Tests: Recent Labs  Lab 11/09/21 0339  AST 21  ALT 24  ALKPHOS 68  BILITOT 0.6   PROT 6.9  ALBUMIN 3.7   No results for input(s): "LIPASE", "AMYLASE" in the last 168 hours. No results for input(s): "AMMONIA" in the last 168 hours. Coagulation Profile: No results for input(s): "INR", "PROTIME" in the last 168 hours. Cardiac Enzymes: No results for input(s): "CKTOTAL", "CKMB", "CKMBINDEX", "TROPONINI" in the last 168 hours. BNP (last 3 results) No results for input(s): "PROBNP" in the last 8760 hours. HbA1C: No results for input(s): "HGBA1C" in the last 72 hours. CBG: No results for input(s): "GLUCAP" in the last 168 hours. Lipid Profile: No results for input(s): "CHOL", "HDL", "LDLCALC", "TRIG", "CHOLHDL", "LDLDIRECT" in the last 72 hours. Thyroid Function Tests: No results for input(s): "TSH", "T4TOTAL", "FREET4", "T3FREE", "THYROIDAB" in the last 72 hours. Anemia Panel: No results for input(s): "VITAMINB12", "FOLATE", "FERRITIN", "TIBC", "IRON", "RETICCTPCT" in the last 72 hours. Sepsis Labs: No results for input(s): "PROCALCITON", "LATICACIDVEN" in the last 168 hours.  No results found for this or any previous visit (from the past 240 hour(s)).   Radiology Studies: No results found.  Scheduled Meds:  baclofen  40 mg Oral QID   buPROPion  300 mg Oral Daily   cholecalciferol  5,000 Units Oral Daily   dalfampridine  10 mg Oral BID   darifenacin  7.5 mg Oral BID   diazepam  7.5 mg Oral Q8H   fentaNYL (SUBLIMAZE) injection  12.5 mcg Intravenous Once   montelukast  10 mg Oral QPC breakfast   pantoprazole  20 mg Oral Daily   polyethylene glycol  17 g Oral Daily   [START ON 11/13/2021] predniSONE  60 mg Oral Q breakfast   psyllium  1 packet Oral Daily   senna-docusate  2 tablet Oral Daily   tiZANidine  8 mg Oral TID   vitamin B-12  2,500 mcg Oral Daily   Continuous Infusions:     LOS: 3 days    Time spent: 25 mins    Joseph Art, DO [facility]   If 7PM-7AM, please contact night-coverage

## 2021-11-13 DIAGNOSIS — G35 Multiple sclerosis: Secondary | ICD-10-CM

## 2021-11-13 DIAGNOSIS — D696 Thrombocytopenia, unspecified: Secondary | ICD-10-CM | POA: Diagnosis not present

## 2021-11-13 LAB — CBC
HCT: 33.8 % — ABNORMAL LOW (ref 36.0–46.0)
Hemoglobin: 10.9 g/dL — ABNORMAL LOW (ref 12.0–15.0)
MCH: 31.9 pg (ref 26.0–34.0)
MCHC: 32.2 g/dL (ref 30.0–36.0)
MCV: 98.8 fL (ref 80.0–100.0)
Platelets: 231 10*3/uL (ref 150–400)
RBC: 3.42 MIL/uL — ABNORMAL LOW (ref 3.87–5.11)
RDW: 14.2 % (ref 11.5–15.5)
WBC: 7.1 10*3/uL (ref 4.0–10.5)
nRBC: 0 % (ref 0.0–0.2)

## 2021-11-13 MED ORDER — PANTOPRAZOLE SODIUM 20 MG PO TBEC: 20.0000 mg | DELAYED_RELEASE_TABLET | Freq: Every day | ORAL | 0 refills | Status: DC

## 2021-11-13 MED ORDER — PREDNISONE 20 MG PO TABS: 60.0000 mg | ORAL_TABLET | Freq: Every day | ORAL | 0 refills | Status: DC

## 2021-11-13 NOTE — Discharge Summary (Signed)
Physician Discharge Summary  Sharon Odom PVX:[id number] DOB: 08-02-1970 DOA: 11/08/2021  PCP: Charisse March, NP-C  Admit date: 11/08/2021 Discharge date: 11/13/2021  Admitted From: home Discharge disposition: home   Recommendations for Outpatient Follow-Up:   Steroids 60mg  daily until seen by hematology and taper will start then Home health   Discharge Diagnosis:   Principal Problem:   Thrombocytopenia (HCC) Active Problems:   Depression with anxiety   Asthma   Multiple sclerosis (HCC)   Urinary incontinence    Discharge Condition: Improved.  Diet recommendation:   Regular.  Wound care: None.  Code status: Full.   History of Present Illness:   Sharon Odom is a 51 y.o. female with medical history significant of multiple sclerosis, GERD, visual abnormalities, anxiety disorder, asthma, depression, chronic pain syndrome with intrathecal pump implant history who was sent over to the ER by her neurologist secondary to low platelets.  Patient was apparently on Lemtrada for her multiple sclerosis.  She has had it for a while and is scheduled.  Repeat blood work.  She went for outpatient follow-up today to the neurologist office and was found to have platelets of 4.  Her platelets were 134 about a month ago.  The medication was held and patient is sent over to the ER for evaluation.  Oncology also consulted about the patient.  Recommendation was to initiate steroids and oncology will follow inpatient or outpatient.  Patient is therefore being admitted to our service for that purpose.  She denied any fever or chills.  Denied any nausea or vomiting.  She is very worried but otherwise no new issues.   Hospital Course by Problem:     Drug-induced thrombocytopenia: Patient had critically low platelet count of 4K.  Last known platelet was 134 K last month. Patient has been receiving Lemtrada for multiple sclerosis for last 4 years. Patient was evaluated by Dr. 54 who  recommended prednisone and IVIG treatment. Clelia Croft has been discontinued.  -2 doses of IVIG, and prednisone 60 mg daily with plan to taper prednisone as an outpatient -plts improved   Multiple sclerosis: Patient will follow-up with Neurology for further new medications. Patient thinks she is having a flare- discussed with neuro and MRI done MRI spine: addended to say NO demyelination   Anxiety disorder: Continue Wellbutrin 300 mg daily  and diazepam 7.5 mg q 8hrs   Urinary incontinence : Continue Enablex 7.5 mg daily.   History of asthma: Continue home inhalers,  not in any acute exacerbation.   Anemia -unclear etiology -outpatient follow up    Medical Consultants:    Oncology Neurology  Discharge Exam:   Vitals:   11/13/21 0536 11/13/21 1431  BP: (!) 104/56 124/79  Pulse: (!) 108 78  Resp: 18 18  Temp: 98.8 F (37.1 C) 98.2 F (36.8 C)  SpO2: 92% 100%   Vitals:   11/12/21 1356 11/12/21 2050 11/13/21 0536 11/13/21 1431  BP: 125/77 113/70 (!) 104/56 124/79  Pulse: 89 81 (!) 108 78  Resp: 17 18 18 18   Temp: 98.2 F (36.8 C) 99 F (37.2 C) 98.8 F (37.1 C) 98.2 F (36.8 C)  TempSrc: Oral Oral Oral Oral  SpO2: 97% 97% 92% 100%  Weight:      Height:        General exam: Appears calm and comfortable.  .    The results of significant diagnostics from this hospitalization (including imaging, microbiology, ancillary and laboratory) are listed below for reference.  Procedures and Diagnostic Studies:   No results found.   Labs:   Basic Metabolic Panel: Recent Labs  Lab 11/08/21 1603 11/09/21 0339  NA 140 141  K 3.8 4.4  CL 108 109  CO2 28 26  GLUCOSE 144* 144*  BUN 12 12  CREATININE 0.62 0.65  CALCIUM 9.4 9.4   GFR Estimated Creatinine Clearance: 79.2 mL/min (by C-G formula based on SCr of 0.65 mg/dL). Liver Function Tests: Recent Labs  Lab 11/09/21 0339  AST 21  ALT 24  ALKPHOS 68  BILITOT 0.6  PROT 6.9  ALBUMIN 3.7   No  results for input(s): "LIPASE", "AMYLASE" in the last 168 hours. No results for input(s): "AMMONIA" in the last 168 hours. Coagulation profile No results for input(s): "INR", "PROTIME" in the last 168 hours.  CBC: Recent Labs  Lab 11/08/21 1603 11/09/21 0339 11/10/21 0311 11/11/21 0307 11/12/21 1103 11/13/21 1210  WBC 5.5 7.7 8.8 8.5 12.5* 7.1  NEUTROABS 3.3  --   --   --   --   --   HGB 11.8* 12.0 10.3* 10.0* 11.4* 10.9*  HCT 35.7* 36.7 31.7* 31.4* 35.5* 33.8*  MCV 96.7 95.8 97.5 99.1 99.2 98.8  PLT <5* <5* 64* 135* 228 231   Cardiac Enzymes: No results for input(s): "CKTOTAL", "CKMB", "CKMBINDEX", "TROPONINI" in the last 168 hours. BNP: Invalid input(s): "POCBNP" CBG: No results for input(s): "GLUCAP" in the last 168 hours. D-Dimer No results for input(s): "DDIMER" in the last 72 hours. Hgb A1c No results for input(s): "HGBA1C" in the last 72 hours. Lipid Profile No results for input(s): "CHOL", "HDL", "LDLCALC", "TRIG", "CHOLHDL", "LDLDIRECT" in the last 72 hours. Thyroid function studies No results for input(s): "TSH", "T4TOTAL", "T3FREE", "THYROIDAB" in the last 72 hours.  Invalid input(s): "FREET3" Anemia work up No results for input(s): "VITAMINB12", "FOLATE", "FERRITIN", "TIBC", "IRON", "RETICCTPCT" in the last 72 hours. Microbiology No results found for this or any previous visit (from the past 240 hour(s)).   Discharge Instructions:   Discharge Instructions     Diet general   Complete by: As directed    Increase activity slowly   Complete by: As directed       Allergies as of 11/13/2021       Reactions   Ziconotide Acetate Shortness Of Breath   Anorexia, weird sensations, could not talk, choking feeling, lost since of taste and smell. Hallucinations, vivid dreams. Confusion and sleepiness. N/v, increase in pain.  Anorexia, weird sensations, could not talk, choking feeling, lost since of taste and smell. Hallucinations, vivid dreams. Confusion and  sleepiness. N/v, increase in pain.    Morphine Other (See Comments)   Pt does not recall reaction    Amoxicillin Nausea And Vomiting   Morphine Sulfate Other (See Comments)   Unknown   Pregabalin Nausea And Vomiting        Medication List     STOP taking these medications    linaclotide 145 MCG Caps capsule Commonly known as: LINZESS   ZANTAC PO       TAKE these medications    albuterol 108 (90 Base) MCG/ACT inhaler Commonly known as: VENTOLIN HFA Inhale 1-2 puffs into the lungs every 6 (six) hours as needed for wheezing or shortness of breath.   baclofen 20 MG tablet Commonly known as: LIORESAL Take 2 tablets (40 mg total) by mouth 4 (four) times daily. Since ITB pump out   Benefiber Powd Take 3.5 Scoops by mouth 2 (two) times daily.   buPROPion  300 MG 24 hr tablet Commonly known as: WELLBUTRIN XL TAKE 1 TABLET DAILY   carboxymethylcellulose 0.5 % Soln Commonly known as: REFRESH PLUS Place 1 drop into both eyes 3 (three) times daily as needed (dry eyes).   conjugated estrogens vaginal cream Commonly known as: PREMARIN Place 1 applicator vaginally 3 (three) times a week.   dalfampridine 10 MG Tb12 Take 1 tablet (10 mg total) by mouth 2 (two) times daily. One po bid   diazepam 5 MG tablet Commonly known as: VALIUM Take 1.5 tablets (7.5 mg total) by mouth every 8 (eight) hours.   montelukast 10 MG tablet Commonly known as: SINGULAIR TAKE 1 TABLET DAILY What changed: when to take this   Oxycodone HCl 10 MG Tabs Take 1 tablet (10 mg total) by mouth every 4 (four) hours as needed. What changed: reasons to take this   oxymetazoline 0.05 % nasal spray Commonly known as: AFRIN Place 1 spray into both nostrils 2 (two) times daily as needed for congestion.   pantoprazole 20 MG tablet Commonly known as: PROTONIX Take 1 tablet (20 mg total) by mouth daily. Start taking on: November 14, 2021   polyethylene glycol 17 g packet Commonly known as: MIRALAX /  GLYCOLAX Take 17 g by mouth daily.   predniSONE 20 MG tablet Commonly known as: DELTASONE Take 3 tablets (60 mg total) by mouth daily with breakfast. You will continue this dose until you see Dr. Juliette Alcide Start taking on: November 14, 2021   senna-docusate 8.6-50 MG tablet Commonly known as: Senokot-S Take 1 tablet by mouth daily. What changed: how much to take   tiZANidine 4 MG tablet Commonly known as: ZANAFLEX TAKE 2 TABLETS THREE TIMES A DAY What changed: See the new instructions.   trospium 20 MG tablet Commonly known as: SANCTURA Take 20 mg by mouth 2 (two) times daily.   Vitamin B-12 2500 MCG Subl Place 1 tablet (2,500 mcg total) under the tongue daily.   Vitamin D-3 125 MCG (5000 UT) Tabs Take 5,000 Units by mouth daily.        Follow-up Information     Charisse March, NP-C Follow up in 1 week(s).   Specialty: Family Medicine Contact information: [phone] [facility] [address] [place] [address] [phone]                  Time coordinating discharge: 45 min  Signed:  Joseph Art DO  [facility] 11/13/2021, 3:11 PM

## 2021-11-13 NOTE — Consult Note (Signed)
NEUROLOGY CONSULTATION NOTE   Date of service: November 13, 2021 Patient Name: Sharon Odom MRN:  [id number] DOB:  [birth year] Reason for consult: "MS with thrombocytopenia caused by [name]" Requesting physician: "Dr. Benjamine Mola" _ _ _   _ __   _ __ _ _  __ __   _ __   __ _  History of Present Illness   Sharon Odom is a 51 y.o. female with PMH significant for  has a past medical history of Allergies, Anxiety, Arthritis, Asthma, Constipation, Depression, Dyspnea, GERD (gastroesophageal reflux disease), Headache, History of hiatal hernia, Left radial nerve palsy (03/15/2020), Multiple sclerosis (HCC), Pneumonia (2007), Vision abnormalities, and Wears glasses. who presents with drug-induced thrombocytopenia and MS.  Patient went to her outpatient follow up and was found to have thrombocytopenia with platelet count of 4000.  She was treated with prednisone and IVIG for this, and [name] was discontinued.  Platelet count has risen to 231.  She currently reports that she feels generally poorly but attributes this to the functioning of her intrathecal pain pump and her need to have her baclofen titrated up slowly after re-implantation of the pump.  She does complain of weakness to her bilateral lower extremities, left worse than right but states that this is her baseline.  Of note, patient is reported to have had an episode of steroid induced psychosis but states that this has never happened and that the only side effect she experiences with steroids is weight gain.   ROS   Per HPI: all other systems reviewed and are negative  Past History   I have reviewed the following:  Past Medical History:  Diagnosis Date   Allergies    Anxiety    Arthritis    Asthma    seasonal - pollen   Constipation    Depression    Dyspnea    with exertion   GERD (gastroesophageal reflux disease)    Headache    History of hiatal hernia    Left radial nerve palsy 03/15/2020   resolved - Left arm/hand weak   Multiple  sclerosis (HCC)    Pneumonia 2007   x 1   Vision abnormalities    Wears glasses    Past Surgical History:  Procedure Laterality Date   EXCISION MORTON'S NEUROMA Right    fallopian tube removal     INTRATHECAL PUMP IMPLANT Right 02/18/2019   Procedure: INTRATHECAL PUMP IMPLANT;  Surgeon: Odette Fraction, MD;  Location: [facility] OR;  Service: Neurosurgery;  Laterality: Right;  INTRATHECAL PUMP IMPLANT   INTRATHECAL PUMP IMPLANT Right 02/17/2021   Procedure: Baclofen Pump Replacement;  Surgeon: Maeola Harman, MD;  Location: [facility] OR;  Service: Neurosurgery;  Laterality: Right;  3C/RM 21   PAIN PUMP IMPLANTATION N/A 09/28/2021   Procedure: Insertion of baclofen pump, right lower quadrant;  Surgeon: Dawley, Alan Mulder, DO;  Location: [facility] OR;  Service: Neurosurgery;  Laterality: N/A;   PAIN PUMP REMOVAL Left 04/06/2021   Procedure: Removal of baclofen pump, Left;  Surgeon: Dawley, Alan Mulder, DO;  Location: [facility] OR;  Service: Neurosurgery;  Laterality: Left;  Lateral/Left//3C rm 19   UPPER GI ENDOSCOPY     WISDOM TOOTH EXTRACTION     WISDOM TOOTH EXTRACTION     Family History  Problem Relation Age of Onset   Diabetes Mother    Healthy Brother    Social History   Socioeconomic History   Marital status: Married    Spouse name: Not on file   Number of children: Not  on file   Years of education: Not on file   Highest education level: Not on file  Occupational History   Not on file  Tobacco Use   Smoking status: Every Day    Packs/day: 0.50    Years: 32.00    Total pack years: 16.00    Types: Cigarettes    Start date: 9   Smokeless tobacco: Never  Vaping Use   Vaping Use: Former   Quit date: 05/29/2011  Substance and Sexual Activity   Alcohol use: Not Currently    Comment: 2-3 times per wk   Drug use: Never   Sexual activity: Not Currently    Birth control/protection: Post-menopausal  Other Topics Concern   Not on file  Social History Narrative   Right handed    Caffeine: 1 cup or less per  day- coffee   [employer]   Social Determinants of Health   Financial Resource Strain: Not on file  Food Insecurity: No Food Insecurity (04/03/2019)   Hunger Vital Sign    Worried About Running Out of Food in the Last Year: Never true    Ran Out of Food in the Last Year: Never true  Transportation Needs: No Transportation Needs (04/03/2019)   PRAPARE - Hydrologist (Medical): No    Lack of Transportation (Non-Medical): No  Physical Activity: Inactive (04/03/2019)   Exercise Vital Sign    Days of Exercise per Week: 0 days    Minutes of Exercise per Session: 0 min  Stress: No Stress Concern Present (04/03/2019)   [place]    Feeling of Stress : Not at all  Social Connections: Not on file   Allergies  Allergen Reactions   Ziconotide Acetate Shortness Of Breath    Anorexia, weird sensations, could not talk, choking feeling, lost since of taste and smell. Hallucinations, vivid dreams. Confusion and sleepiness. N/v, increase in pain.  Anorexia, weird sensations, could not talk, choking feeling, lost since of taste and smell. Hallucinations, vivid dreams. Confusion and sleepiness. N/v, increase in pain.    Morphine Other (See Comments)    Pt does not recall reaction    Amoxicillin Nausea And Vomiting   Morphine Sulfate Other (See Comments)    Unknown   Pregabalin Nausea And Vomiting    Medications   Medications Prior to Admission  Medication Sig Dispense Refill Last Dose   albuterol (VENTOLIN HFA) 108 (90 Base) MCG/ACT inhaler Inhale 1-2 puffs into the lungs every 6 (six) hours as needed for wheezing or shortness of breath. 6.7 g 1 last year   baclofen (LIORESAL) 20 MG tablet Take 2 tablets (40 mg total) by mouth 4 (four) times daily. Since ITB pump out 720 tablet 3 11/08/2021   buPROPion (WELLBUTRIN XL) 300 MG 24 hr tablet TAKE 1 TABLET DAILY (Patient taking differently: Take 300 mg by mouth  daily.) 90 tablet 3 11/08/2021   carboxymethylcellulose (REFRESH PLUS) 0.5 % SOLN Place 1 drop into both eyes 3 (three) times daily as needed (dry eyes).   11/08/2021   Cholecalciferol (VITAMIN D-3) 125 MCG (5000 UT) TABS Take 5,000 Units by mouth daily. 30 tablet 1 11/08/2021   conjugated estrogens (PREMARIN) vaginal cream Place 1 applicator vaginally 3 (three) times a week.   Past Month   Cyanocobalamin (VITAMIN B-12) 2500 MCG SUBL Place 1 tablet (2,500 mcg total) under the tongue daily. 30 tablet 0 11/08/2021   dalfampridine 10 MG TB12 Take 1  tablet (10 mg total) by mouth 2 (two) times daily. One po bid 180 tablet 3 11/08/2021   diazepam (VALIUM) 5 MG tablet Take 1.5 tablets (7.5 mg total) by mouth every 8 (eight) hours. 140 tablet 5 11/08/2021   montelukast (SINGULAIR) 10 MG tablet TAKE 1 TABLET DAILY (Patient taking differently: Take 10 mg by mouth daily after breakfast.) 90 tablet 3 11/08/2021   Oxycodone HCl 10 MG TABS Take 1 tablet (10 mg total) by mouth every 4 (four) hours as needed. (Patient taking differently: Take 10 mg by mouth every 4 (four) hours as needed (pain).) 120 tablet 0 11/08/2021   oxymetazoline (AFRIN) 0.05 % nasal spray Place 1 spray into both nostrils 2 (two) times daily as needed for congestion.   11/07/2021   polyethylene glycol (MIRALAX / GLYCOLAX) 17 g packet Take 17 g by mouth daily.   11/08/2021   raNITIdine HCl (ZANTAC PO) Take 10 mg by mouth daily as needed.   11/05/2021   senna-docusate (SENOKOT-S) 8.6-50 MG tablet Take 1 tablet by mouth daily. (Patient taking differently: Take 2 tablets by mouth daily.) 30 tablet 0 11/08/2021   tiZANidine (ZANAFLEX) 4 MG tablet TAKE 2 TABLETS THREE TIMES A DAY (Patient taking differently: Take 8 mg by mouth 3 (three) times daily.) 720 tablet 3 11/08/2021   trospium (SANCTURA) 20 MG tablet Take 20 mg by mouth 2 (two) times daily.   11/08/2021   Wheat Dextrin (BENEFIBER) POWD Take 3.5 Scoops by mouth 2 (two) times daily.   11/08/2021    linaclotide (LINZESS) 145 MCG CAPS capsule Take 1 capsule (145 mcg total) by mouth daily before breakfast. In setting of severe constipation in spite of multiple doses of bowels meds- - due to neurologically based constipation made worse by Baclofen and Opiates (Patient not taking: Reported on 11/08/2021) 30 capsule 5 Not Taking      Current Facility-Administered Medications:    acetaminophen (TYLENOL) tablet 1,000 mg, 1,000 mg, Oral, Q6H PRN, Val Riles, MD, 1,000 mg at 11/13/21 1039   albuterol (PROVENTIL) (2.5 MG/3ML) 0.083% nebulizer solution 2.5 mg, 2.5 mg, Inhalation, Q6H PRN, Jonelle Sidle, Mohammad L, MD   baclofen (LIORESAL) tablet 40 mg, 40 mg, Oral, QID, Jonelle Sidle, Mohammad L, MD, 40 mg at 11/13/21 1402   buPROPion (WELLBUTRIN XL) 24 hr tablet 300 mg, 300 mg, Oral, Daily, Jonelle Sidle, Mohammad L, MD, 300 mg at 11/13/21 1003   cholecalciferol (VITAMIN D3) tablet 5,000 Units, 5,000 Units, Oral, Daily, Gala Romney L, MD, 5,000 Units at 11/13/21 1005   dalfampridine TB12 10 mg, 10 mg, Oral, BID, Shawna Clamp, MD, 10 mg at 11/13/21 1003   darifenacin (ENABLEX) 24 hr tablet 7.5 mg, 7.5 mg, Oral, BID, Shawna Clamp, MD, 7.5 mg at 11/13/21 1003   diazepam (VALIUM) tablet 7.5 mg, 7.5 mg, Oral, Q8H, Garba, Mohammad L, MD, 7.5 mg at 11/13/21 1350   montelukast (SINGULAIR) tablet 10 mg, 10 mg, Oral, QPC breakfast, Jonelle Sidle, Mohammad L, MD, 10 mg at 11/13/21 1003   ondansetron (ZOFRAN) tablet 4 mg, 4 mg, Oral, Q6H PRN **OR** ondansetron (ZOFRAN) injection 4 mg, 4 mg, Intravenous, Q6H PRN, Jonelle Sidle, Mohammad L, MD   oxyCODONE (Oxy IR/ROXICODONE) immediate release tablet 10 mg, 10 mg, Oral, Q4H PRN, Jonelle Sidle, Mohammad L, MD, 10 mg at 11/13/21 1039   pantoprazole (PROTONIX) EC tablet 20 mg, 20 mg, Oral, Daily, Shawna Clamp, MD, 20 mg at 11/13/21 1003   polyethylene glycol (MIRALAX / GLYCOLAX) packet 17 g, 17 g, Oral, Daily, Garba, Mohammad L, MD, 17 g  at 11/13/21 1005   polyvinyl alcohol (LIQUIFILM TEARS) 1.4 %  ophthalmic solution 1 drop, 1 drop, Both Eyes, TID PRN, Jonelle Sidle, Mohammad L, MD   predniSONE (DELTASONE) tablet 60 mg, 60 mg, Oral, Q breakfast, Vann, Jessica U, DO, 60 mg at 11/13/21 1004   psyllium (HYDROCIL/METAMUCIL) 1 packet, 1 packet, Oral, Daily, Shawna Clamp, MD, 1 packet at 11/13/21 1005   senna-docusate (Senokot-S) tablet 2 tablet, 2 tablet, Oral, Daily, Gala Romney L, MD, 2 tablet at 11/13/21 1007   tiZANidine (ZANAFLEX) tablet 8 mg, 8 mg, Oral, TID, Jonelle Sidle, Mohammad L, MD, 8 mg at 11/13/21 1350   vitamin B-12 (CYANOCOBALAMIN) tablet 2,500 mcg, 2,500 mcg, Oral, Daily, Garba, Mohammad L, MD, 2,500 mcg at 11/13/21 1005  Vitals   Vitals:   11/12/21 1356 11/12/21 2050 11/13/21 0536 11/13/21 1431  BP: 125/77 113/70 (!) 104/56 124/79  Pulse: 89 81 (!) 108 78  Resp: 17 18 18 18   Temp: 98.2 F (36.8 C) 99 F (37.2 C) 98.8 F (37.1 C) 98.2 F (36.8 C)  TempSrc: Oral Oral Oral Oral  SpO2: 97% 97% 92% 100%  Weight:      Height:         Body mass index is 19.2 kg/m.  Physical Exam   Physical Exam Gen: A&O x4, NAD HEENT: Atraumatic, normocephalic;mucous membranes moist; oropharynx clear, tongue without atrophy or fasciculations. Neck: Supple, trachea midline. Resp: CTAB, no w/r/r Extrem: Nml bulk; no cyanosis, clubbing, or edema.  Neuro: MS: A&O x4. Follows multi-step commands.  Speech: fluid, nondysarthric, able to name and repeat but hypophonic CN:    I: Deferred   II,III: PERRLA   III,IV,VI: EOMI w/o nystagmus, no ptosis   V: Sensation intact from V1 to V3 to LT   VII: Eyelid closure was full.  Smile symmetric.   VIII: Hearing intact to voice   IX,X: Voice hypophonic    XI: SCM/trap 5/5 bilat   XII: Tongue protrudes midline, no atrophy or fasciculations   Motor:   Normal bulk.  No tremor, rigidity or bradykinesia. No pronator drift.  5/5 strength to RUE, 4/5 to LUE, 3/5 to RLE and 2/5 to LLE  Sensory: Intact to light touch throughout. Symmetric.  Propioception intact bilat.  No double-simultaneous extinction.  Coordination:  Finger-to-nose, rapid alternating motions were intact. Reflexes:  2+ and symmetric throughout without clonus; toes down-going bilat Gait: deferred   Labs   CBC:  Recent Labs  Lab 11/08/21 1603 11/09/21 0339 11/12/21 1103 11/13/21 1210  WBC 5.5   < > 12.5* 7.1  NEUTROABS 3.3  --   --   --   HGB 11.8*   < > 11.4* 10.9*  HCT 35.7*   < > 35.5* 33.8*  MCV 96.7   < > 99.2 98.8  PLT <5*   < > 228 231   < > = values in this interval not displayed.    Basic Metabolic Panel:  Lab Results  Component Value Date   NA 141 11/09/2021   K 4.4 11/09/2021   CO2 26 11/09/2021   GLUCOSE 144 (H) 11/09/2021   BUN 12 11/09/2021   CREATININE 0.65 11/09/2021   CALCIUM 9.4 11/09/2021   GFRNONAA >60 11/09/2021   GFRAA >60 01/05/2020   Lipid Panel: No results found for: "LDLCALC" HgbA1c: No results found for: "HGBA1C" Urine Drug Screen: No results found for: "LABOPIA", "COCAINSCRNUR", "LABBENZ", "AMPHETMU", "THCU", "LABBARB"  Alcohol Level No results found for: "ETH"  MRI Brain Redemonstrated findings consistent with patient's diagnosis of  MS without active demyelination    Impression   51 year old patient with history of MS admitted with drug-induced thrombocytopenia.  Per Dr. Selina Cooley, patient does not have active demyelinating lesions on MRI.  Thrombocytopenia has resolved with IVIG and prednisone.  No need for high dose steroids as no demyelinating lesions seen on MRI and patient's weakness appears to be at baseline.  Recommendations    - No indication for steroids given no e/o abnl enhancement / active demyelination on MRI brain/c/t spine wwo - Patient may f/u with established outpatient neurologist - Neurology to sign off, but please re-engage if additional questions arise ______________________________________________________________________   Thank you for the opportunity to take part in the care of this  patient. If you have any further questions, please contact the neurology consultation attending.  Note to be edited by MD  Signed,  Cortney E Ernestina Columbia , MSN, AGACNP-BC [facility] See Amion for schedule and pager information 11/13/2021 3:20 PM    If 7pm- 7am, please page neurology on call as listed in AMION.   Attending Neurohospitalist Addendum Patient seen and examined with APP/Resident. Agree with the history and physical as documented above. Agree with the plan as documented, which I helped formulate. I have edited the note above to reflect my findings and recommendations. I have independently reviewed the chart, obtained history, review of systems and examined the patient.I have personally reviewed pertinent head/neck/spine imaging (CT/MRI). Please feel free to call with any questions.  On personal review, MRI brain/c/t spine wwo did not show any abnormal enhancement c/f active demyelination. Radiology agrees and will addend their report. No indication for steroids at this time. Suspect weakness exacerbated in the setting of worse spasticity given her recent baclofen issues and the fact that she now has to uptitrate up again and is still on low dose. No further inpatient neurologic workup indicated - her exam is near baseline described in most recent outpatient neurology note. Neuro to sign off, pt may f/u with her established outpatient neurologist.  Bing Neighbors, MD [facility] [phone]  If 7pm- 7am, please page neurology on call as listed in AMION.

## 2021-11-13 NOTE — Progress Notes (Signed)
Patient discharged to home w/ family. Given all belongings, instructions. Verbalized understanding of all instructions. Escorted to pov via personal w/c.

## 2021-11-13 NOTE — TOC Transition Note (Signed)
Transition of Care [facility]) - CM/SW Discharge Note   Patient Details  Name: Sharon Odom MRN: [id number] Date of Birth: [birth year]  Transition of Care [facility] CM/SW Contact:  Darleene Cleaver, LCSW Phone Number: 11/13/2021, 3:42 PM   Clinical Narrative:     Patient will be going home with home health through [facility].  CSW signing off please reconsult with any other social work needs, home health agency has been notified of planned discharge.   Final next level of care: Home w Home Health Services Barriers to Discharge: Barriers Resolved   Patient Goals and CMS Choice Patient states their goals for this hospitalization and ongoing recovery are:: To return back home with home health. CMS Medicare.gov Compare Post Acute Care list provided to:: Patient Choice offered to / list presented to : Patient  Discharge Placement                       Discharge Plan and Services                          HH Arranged: PT, OT, Nurse's Aide HH Agency: [facility] Date [facility] Agency Contacted: 11/10/21 Time HH Agency Contacted: [phone] Representative spoke with at [facility] Agency: Pearson Grippe  Social Determinants of Health (SDOH) Interventions     Readmission Risk Interventions     No data to display

## 2021-11-13 NOTE — Progress Notes (Addendum)
PROGRESS NOTE    Bracha Frankowski  GDJ:[id number] DOB: 1971/01/18 DOA: 11/08/2021  PCP: Charisse March, NP-C   Brief Narrative:  This 51 years old female with PMH significant for multiple sclerosis, GERD, visual abnormalities, anxiety disorder, asthma, depression, chronic pain syndrome with intrathecal pump implant, who was sent to the ED by her neurologist secondary to low platelets.  Patient was apparently on Lemtrada for her multiple sclerosis.  Patient has received that medication for almost 4 years.  She was due for her blood work.  She went for outpatient follow-up and she was found to have platelet count of 4000.  Her platelets were 134K one month ago.  The medication was held and patient was sent in the ED. Patient was admitted for likely drug induced thrombocytopenia.  Hematology was consulted. Patient was started on prednisone and IVIG treatment. Platelet count has improved with a dose of IVIG and prednisone 6/18: patient reported MS symptoms, MRI done that showed: findings consistent with the patient's diagnosis of multiple sclerosis with enhancing lesions to suggest active demyelination. New T2 hyperintense signal is noted in the spinal cord at C4-C5 and possibly at C5-C6 and T4-T7, concerning for additional demyelinating spinal cord lesions.  ADDENDUM: report incorrect and will be updated to say NO ACTIVE DEMYELINATION    Assessment & Plan:   Principal Problem:   Thrombocytopenia (HCC) Active Problems:   Depression with anxiety   Asthma   Multiple sclerosis (HCC)   Urinary incontinence   Drug-induced thrombocytopenia: Patient had critically low platelet count of 4K.  Last known platelet was 134 K last month. Patient has been receiving Lemtrada for multiple sclerosis for last 4 years. Patient was evaluated by Dr. Clelia Croft who recommended prednisone and IVIG treatment. [name] has been discontinued.  Continue to monitor platelet count. -2 doses of IVIG, and prednisone 60 mg  daily with plan to taper prednisone as an outpatient -plts improved  Multiple sclerosis: Patient will follow-up with Neurology for further new medications. Patient thinks she is having a flare- discussed with neuro MRI spine: findings consistent with the patient's diagnosis of multiple sclerosis with enhancing lesions to suggest active demyelination. New T2 hyperintense signal is noted in the spinal cord at C4-C5 and possibly at C5-C6 and T4-T7, concerning for additional demyelinating spinal cord lesions. -neuro to eval 6/19 PM  Anxiety disorder: Continue Wellbutrin 300 mg daily  and diazepam 7.5 mg q 8hrs  Urinary incontinence : Continue Enablex 7.5 mg daily.  History of asthma: Continue home inhalers,  not in any acute exacerbation.  PT/OT eval- home health  DVT prophylaxis: SCDs Code Status: Full code. Family Communication: No family at bed side Disposition Plan: pending recs from neuro     Consultants:  Hematology  Neurology      Subjective: Still feels weak   Objective: Vitals:   11/12/21 0654 11/12/21 1356 11/12/21 2050 11/13/21 0536  BP: 115/69 125/77 113/70 (!) 104/56  Pulse: 90 89 81 (!) 108  Resp: 16 17 18 18   Temp: 98 F (36.7 C) 98.2 F (36.8 C) 99 F (37.2 C) 98.8 F (37.1 C)  TempSrc: Oral Oral Oral Oral  SpO2: 100% 97% 97% 92%  Weight:      Height:        Intake/Output Summary (Last 24 hours) at 11/13/2021 1154 Last data filed at 11/13/2021 0536 Gross per 24 hour  Intake 360 ml  Output 1240 ml  Net -880 ml   Filed Weights   11/08/21 2000  Weight: 59 kg  Examination:   General: Appearance:    Thin female in no acute distress     Lungs:      respirations unlabored  Heart:    Tachycardic.      Neurologic:   Awake, alert, speech difficult to understand, spastic on left side       Data Reviewed: I have personally reviewed following labs and imaging studies  CBC: Recent Labs  Lab 11/08/21 1603 11/09/21 0339 11/10/21 0311  11/11/21 0307 11/12/21 1103  WBC 5.5 7.7 8.8 8.5 12.5*  NEUTROABS 3.3  --   --   --   --   HGB 11.8* 12.0 10.3* 10.0* 11.4*  HCT 35.7* 36.7 31.7* 31.4* 35.5*  MCV 96.7 95.8 97.5 99.1 99.2  PLT <5* <5* 64* 135* [id number]   Basic Metabolic Panel: Recent Labs  Lab 11/08/21 1603 11/09/21 0339  NA 140 141  K 3.8 4.4  CL 108 109  CO2 28 26  GLUCOSE 144* 144*  BUN 12 12  CREATININE 0.62 0.65  CALCIUM 9.4 9.4   GFR: Estimated Creatinine Clearance: 79.2 mL/min (by C-G formula based on SCr of 0.65 mg/dL). Liver Function Tests: Recent Labs  Lab 11/09/21 0339  AST 21  ALT 24  ALKPHOS 68  BILITOT 0.6  PROT 6.9  ALBUMIN 3.7   No results for input(s): "LIPASE", "AMYLASE" in the last 168 hours. No results for input(s): "AMMONIA" in the last 168 hours. Coagulation Profile: No results for input(s): "INR", "PROTIME" in the last 168 hours. Cardiac Enzymes: No results for input(s): "CKTOTAL", "CKMB", "CKMBINDEX", "TROPONINI" in the last 168 hours. BNP (last 3 results) No results for input(s): "PROBNP" in the last 8760 hours. HbA1C: No results for input(s): "HGBA1C" in the last 72 hours. CBG: No results for input(s): "GLUCAP" in the last 168 hours. Lipid Profile: No results for input(s): "CHOL", "HDL", "LDLCALC", "TRIG", "CHOLHDL", "LDLDIRECT" in the last 72 hours. Thyroid Function Tests: No results for input(s): "TSH", "T4TOTAL", "FREET4", "T3FREE", "THYROIDAB" in the last 72 hours. Anemia Panel: No results for input(s): "VITAMINB12", "FOLATE", "FERRITIN", "TIBC", "IRON", "RETICCTPCT" in the last 72 hours. Sepsis Labs: No results for input(s): "PROCALCITON", "LATICACIDVEN" in the last 168 hours.  No results found for this or any previous visit (from the past 240 hour(s)).   Radiology Studies: MR BRAIN W WO CONTRAST  Result Date: 11/12/2021 CLINICAL DATA:  Multiple sclerosis EXAM: MRI HEAD WITHOUT AND WITH CONTRAST MRI CERVICAL SPINE WITHOUT AND WITH CONTRAST MRI CERVICAL THORACIC  WITHOUT AND CONTRAST CONTRAST:  92mL GADAVIST GADOBUTROL 1 MMOL/ML IV SOLN TECHNIQUE: Multiplanar, multiecho pulse sequences of the brain and surrounding structures, and cervical and thoracic spine were obtained without and with intravenous contrast. COMPARISON:  06/18/2021 FINDINGS: MRI HEAD FINDINGS Brain: No restricted diffusion to suggest acute or subacute infarct. No acute hemorrhage trauma mass, mass effect, or midline shift. No hemosiderin deposition to suggest remote hemorrhage. No abnormal parenchymal or meningeal enhancement. Redemonstrated T2 hyperintense foci in the periventricular, juxtacortical, and infratentorial white matter consistent with the patient's diagnosis of multiple sclerosis. No new lesions are seen. Vascular: Normal arterial flow voids.  Normal vascular enhancement. Skull and upper cervical spine: Normal marrow signal. Redemonstrated right frontoparietal calvarial osteoma. Sinuses/Orbits: No acute finding. Other: The mastoids are well aerated. MRI CERVICAL SPINE FINDINGS Alignment: Physiologic. Vertebrae: No acute fracture or suspicious osseous lesion. Redemonstrated T1 and T2 hyperintense foci, most prominently in the C7 vertebral body, consistent with benign hemangiomas. Cord: No abnormal enhancement. Redemonstrated patchy signal abnormality involving the central spinal  cord at C2-C3 (series 10, image 9), the left dorsal lateral cord at C4 (series 7, image 13-14), right dorsal lateral cord at C6 (series 7, image 26) New T2 hyperintense signal in the dorsal lateral cord at C4-C5 (series 7, image 16-17). Possible new increased T2 signal along the right dorsal lateral cord at C5-C6, although this is only seen on 1 slice (series 7, image 22) and may be artifactual. Posterior Fossa, vertebral arteries, paraspinal tissues: Please see MRA head findings for findings in the posterior fossa. Normal vertebral arteries. Normal craniocervical junction. Disc levels: C2-C3: No significant disc bulge.  No spinal canal stenosis or neuroforaminal narrowing. C3-C4: Minimal disc bulge. Facet and uncovertebral hypertrophy. No spinal canal stenosis. Mild left neural foraminal narrowing, unchanged. C4-C5: Mild disc bulge. Uncovertebral and facet arthropathy. No spinal canal stenosis. Mild-to-moderate right neural foraminal narrowing, unchanged. C5-C6: Mild disc bulge. Facet and uncovertebral hypertrophy. Mild spinal canal stenosis, unchanged. Moderate right-greater-than-left neural foraminal narrowing, unchanged. C6-C7: Mild disc bulge. Facet and uncovertebral hypertrophy. No spinal canal stenosis. Moderate left and mild right neural foraminal narrowing, unchanged. C7-T1: No significant disc bulge. No spinal canal stenosis or neuroforaminal narrowing. MRI THORACIC SPINE FINDINGS Alignment:  Physiologic. Vertebrae: No acute fracture or suspicious osseous line. Multiple T1 and T2 hyperintense foci, compatible with benign hemangiomas with additional atypical hemangiomas in T5 and T10. No abnormal osseous enhancement. Cord: Evaluation is somewhat limited by respiratory motion artifact. T2 hyperintense signal in the anterior aspect of the spinal cord from T4 through T7 (series 19, images 6-10), which may have correlate on the sagittal T2 (series 22, image 9), new from the prior exam. This area is not associated with contrast enhancement. Paraspinal and other soft tissues: Heterogeneous opacities in the lower lungs which are poorly evaluated but may represent atelectasis. Disc levels: No significant degenerative changes. No significant spinal canal stenosis or neural foraminal narrowing. IMPRESSION: 1. Redemonstrated findings consistent with the patient's diagnosis of multiple sclerosis with enhancing lesions to suggest active demyelination. New T2 hyperintense signal is noted in the spinal cord at C4-C5 and possibly at C5-C6 and T4-T7, concerning for additional demyelinating spinal cord lesions. Attention on follow-up. 2.  Degenerative changes in the cervical spine which are stable from 06/18/2021 and are worst at C5-C6 where there is mild spinal canal stenosis and moderate bilateral neural foraminal narrowing. Electronically Signed   By: Merilyn Baba M.D.   On: 11/12/2021 21:26   MR CERVICAL SPINE W WO CONTRAST  Result Date: 11/12/2021 CLINICAL DATA:  Multiple sclerosis EXAM: MRI HEAD WITHOUT AND WITH CONTRAST MRI CERVICAL SPINE WITHOUT AND WITH CONTRAST MRI CERVICAL THORACIC WITHOUT AND CONTRAST CONTRAST:  59mL GADAVIST GADOBUTROL 1 MMOL/ML IV SOLN TECHNIQUE: Multiplanar, multiecho pulse sequences of the brain and surrounding structures, and cervical and thoracic spine were obtained without and with intravenous contrast. COMPARISON:  06/18/2021 FINDINGS: MRI HEAD FINDINGS Brain: No restricted diffusion to suggest acute or subacute infarct. No acute hemorrhage trauma mass, mass effect, or midline shift. No hemosiderin deposition to suggest remote hemorrhage. No abnormal parenchymal or meningeal enhancement. Redemonstrated T2 hyperintense foci in the periventricular, juxtacortical, and infratentorial white matter consistent with the patient's diagnosis of multiple sclerosis. No new lesions are seen. Vascular: Normal arterial flow voids.  Normal vascular enhancement. Skull and upper cervical spine: Normal marrow signal. Redemonstrated right frontoparietal calvarial osteoma. Sinuses/Orbits: No acute finding. Other: The mastoids are well aerated. MRI CERVICAL SPINE FINDINGS Alignment: Physiologic. Vertebrae: No acute fracture or suspicious osseous lesion. Redemonstrated T1 and T2 hyperintense foci,  most prominently in the C7 vertebral body, consistent with benign hemangiomas. Cord: No abnormal enhancement. Redemonstrated patchy signal abnormality involving the central spinal cord at C2-C3 (series 10, image 9), the left dorsal lateral cord at C4 (series 7, image 13-14), right dorsal lateral cord at C6 (series 7, image 26) New T2  hyperintense signal in the dorsal lateral cord at C4-C5 (series 7, image 16-17). Possible new increased T2 signal along the right dorsal lateral cord at C5-C6, although this is only seen on 1 slice (series 7, image 22) and may be artifactual. Posterior Fossa, vertebral arteries, paraspinal tissues: Please see MRA head findings for findings in the posterior fossa. Normal vertebral arteries. Normal craniocervical junction. Disc levels: C2-C3: No significant disc bulge. No spinal canal stenosis or neuroforaminal narrowing. C3-C4: Minimal disc bulge. Facet and uncovertebral hypertrophy. No spinal canal stenosis. Mild left neural foraminal narrowing, unchanged. C4-C5: Mild disc bulge. Uncovertebral and facet arthropathy. No spinal canal stenosis. Mild-to-moderate right neural foraminal narrowing, unchanged. C5-C6: Mild disc bulge. Facet and uncovertebral hypertrophy. Mild spinal canal stenosis, unchanged. Moderate right-greater-than-left neural foraminal narrowing, unchanged. C6-C7: Mild disc bulge. Facet and uncovertebral hypertrophy. No spinal canal stenosis. Moderate left and mild right neural foraminal narrowing, unchanged. C7-T1: No significant disc bulge. No spinal canal stenosis or neuroforaminal narrowing. MRI THORACIC SPINE FINDINGS Alignment:  Physiologic. Vertebrae: No acute fracture or suspicious osseous line. Multiple T1 and T2 hyperintense foci, compatible with benign hemangiomas with additional atypical hemangiomas in T5 and T10. No abnormal osseous enhancement. Cord: Evaluation is somewhat limited by respiratory motion artifact. T2 hyperintense signal in the anterior aspect of the spinal cord from T4 through T7 (series 19, images 6-10), which may have correlate on the sagittal T2 (series 22, image 9), new from the prior exam. This area is not associated with contrast enhancement. Paraspinal and other soft tissues: Heterogeneous opacities in the lower lungs which are poorly evaluated but may represent  atelectasis. Disc levels: No significant degenerative changes. No significant spinal canal stenosis or neural foraminal narrowing. IMPRESSION: 1. Redemonstrated findings consistent with the patient's diagnosis of multiple sclerosis with enhancing lesions to suggest active demyelination. New T2 hyperintense signal is noted in the spinal cord at C4-C5 and possibly at C5-C6 and T4-T7, concerning for additional demyelinating spinal cord lesions. Attention on follow-up. 2. Degenerative changes in the cervical spine which are stable from 06/18/2021 and are worst at C5-C6 where there is mild spinal canal stenosis and moderate bilateral neural foraminal narrowing. Electronically Signed   By: Merilyn Baba M.D.   On: 11/12/2021 21:26   MR THORACIC SPINE W WO CONTRAST  Result Date: 11/12/2021 CLINICAL DATA:  Multiple sclerosis EXAM: MRI HEAD WITHOUT AND WITH CONTRAST MRI CERVICAL SPINE WITHOUT AND WITH CONTRAST MRI CERVICAL THORACIC WITHOUT AND CONTRAST CONTRAST:  10mL GADAVIST GADOBUTROL 1 MMOL/ML IV SOLN TECHNIQUE: Multiplanar, multiecho pulse sequences of the brain and surrounding structures, and cervical and thoracic spine were obtained without and with intravenous contrast. COMPARISON:  06/18/2021 FINDINGS: MRI HEAD FINDINGS Brain: No restricted diffusion to suggest acute or subacute infarct. No acute hemorrhage trauma mass, mass effect, or midline shift. No hemosiderin deposition to suggest remote hemorrhage. No abnormal parenchymal or meningeal enhancement. Redemonstrated T2 hyperintense foci in the periventricular, juxtacortical, and infratentorial white matter consistent with the patient's diagnosis of multiple sclerosis. No new lesions are seen. Vascular: Normal arterial flow voids.  Normal vascular enhancement. Skull and upper cervical spine: Normal marrow signal. Redemonstrated right frontoparietal calvarial osteoma. Sinuses/Orbits: No acute finding. Other: The mastoids  are well aerated. MRI CERVICAL SPINE  FINDINGS Alignment: Physiologic. Vertebrae: No acute fracture or suspicious osseous lesion. Redemonstrated T1 and T2 hyperintense foci, most prominently in the C7 vertebral body, consistent with benign hemangiomas. Cord: No abnormal enhancement. Redemonstrated patchy signal abnormality involving the central spinal cord at C2-C3 (series 10, image 9), the left dorsal lateral cord at C4 (series 7, image 13-14), right dorsal lateral cord at C6 (series 7, image 26) New T2 hyperintense signal in the dorsal lateral cord at C4-C5 (series 7, image 16-17). Possible new increased T2 signal along the right dorsal lateral cord at C5-C6, although this is only seen on 1 slice (series 7, image 22) and may be artifactual. Posterior Fossa, vertebral arteries, paraspinal tissues: Please see MRA head findings for findings in the posterior fossa. Normal vertebral arteries. Normal craniocervical junction. Disc levels: C2-C3: No significant disc bulge. No spinal canal stenosis or neuroforaminal narrowing. C3-C4: Minimal disc bulge. Facet and uncovertebral hypertrophy. No spinal canal stenosis. Mild left neural foraminal narrowing, unchanged. C4-C5: Mild disc bulge. Uncovertebral and facet arthropathy. No spinal canal stenosis. Mild-to-moderate right neural foraminal narrowing, unchanged. C5-C6: Mild disc bulge. Facet and uncovertebral hypertrophy. Mild spinal canal stenosis, unchanged. Moderate right-greater-than-left neural foraminal narrowing, unchanged. C6-C7: Mild disc bulge. Facet and uncovertebral hypertrophy. No spinal canal stenosis. Moderate left and mild right neural foraminal narrowing, unchanged. C7-T1: No significant disc bulge. No spinal canal stenosis or neuroforaminal narrowing. MRI THORACIC SPINE FINDINGS Alignment:  Physiologic. Vertebrae: No acute fracture or suspicious osseous line. Multiple T1 and T2 hyperintense foci, compatible with benign hemangiomas with additional atypical hemangiomas in T5 and T10. No abnormal  osseous enhancement. Cord: Evaluation is somewhat limited by respiratory motion artifact. T2 hyperintense signal in the anterior aspect of the spinal cord from T4 through T7 (series 19, images 6-10), which may have correlate on the sagittal T2 (series 22, image 9), new from the prior exam. This area is not associated with contrast enhancement. Paraspinal and other soft tissues: Heterogeneous opacities in the lower lungs which are poorly evaluated but may represent atelectasis. Disc levels: No significant degenerative changes. No significant spinal canal stenosis or neural foraminal narrowing. IMPRESSION: 1. Redemonstrated findings consistent with the patient's diagnosis of multiple sclerosis with enhancing lesions to suggest active demyelination. New T2 hyperintense signal is noted in the spinal cord at C4-C5 and possibly at C5-C6 and T4-T7, concerning for additional demyelinating spinal cord lesions. Attention on follow-up. 2. Degenerative changes in the cervical spine which are stable from 06/18/2021 and are worst at C5-C6 where there is mild spinal canal stenosis and moderate bilateral neural foraminal narrowing. Electronically Signed   By: Wiliam Ke M.D.   On: 11/12/2021 21:26    Scheduled Meds:  baclofen  40 mg Oral QID   buPROPion  300 mg Oral Daily   cholecalciferol  5,000 Units Oral Daily   dalfampridine  10 mg Oral BID   darifenacin  7.5 mg Oral BID   diazepam  7.5 mg Oral Q8H   montelukast  10 mg Oral QPC breakfast   pantoprazole  20 mg Oral Daily   polyethylene glycol  17 g Oral Daily   predniSONE  60 mg Oral Q breakfast   psyllium  1 packet Oral Daily   senna-docusate  2 tablet Oral Daily   tiZANidine  8 mg Oral TID   vitamin B-12  2,500 mcg Oral Daily   Continuous Infusions:     LOS: 4 days    Time spent: 25 mins  Geradine Girt, DO [facility]   If 7PM-7AM, please contact night-coverage

## 2021-11-14 ENCOUNTER — Other Ambulatory Visit: Payer: BLUE CROSS/BLUE SHIELD

## 2021-11-21 ENCOUNTER — Inpatient Hospital Stay (HOSPITAL_BASED_OUTPATIENT_CLINIC_OR_DEPARTMENT_OTHER): Payer: BLUE CROSS/BLUE SHIELD | Admitting: Oncology

## 2021-11-21 ENCOUNTER — Other Ambulatory Visit: Payer: Self-pay

## 2021-11-21 ENCOUNTER — Inpatient Hospital Stay: Payer: BLUE CROSS/BLUE SHIELD | Attending: Oncology

## 2021-11-21 VITALS — BP 119/75 | HR 82 | Temp 97.7°F | Resp 16 | Ht 69.0 in

## 2021-11-21 DIAGNOSIS — D693 Immune thrombocytopenic purpura: Secondary | ICD-10-CM

## 2021-11-21 DIAGNOSIS — G35 Multiple sclerosis: Secondary | ICD-10-CM | POA: Insufficient documentation

## 2021-11-21 LAB — CBC WITH DIFFERENTIAL (CANCER CENTER ONLY)
Abs Immature Granulocytes: 0.12 10*3/uL — ABNORMAL HIGH (ref 0.00–0.07)
Basophils Absolute: 0 10*3/uL (ref 0.0–0.1)
Basophils Relative: 0 %
Eosinophils Absolute: 0 10*3/uL (ref 0.0–0.5)
Eosinophils Relative: 0 %
HCT: 36.3 % (ref 36.0–46.0)
Hemoglobin: 12 g/dL (ref 12.0–15.0)
Immature Granulocytes: 1 %
Lymphocytes Relative: 9 %
Lymphs Abs: 1 10*3/uL (ref 0.7–4.0)
MCH: 31.9 pg (ref 26.0–34.0)
MCHC: 33.1 g/dL (ref 30.0–36.0)
MCV: 96.5 fL (ref 80.0–100.0)
Monocytes Absolute: 0.3 10*3/uL (ref 0.1–1.0)
Monocytes Relative: 3 %
Neutro Abs: 10 10*3/uL — ABNORMAL HIGH (ref 1.7–7.7)
Neutrophils Relative %: 87 %
Platelet Count: 239 10*3/uL (ref 150–400)
RBC: 3.76 MIL/uL — ABNORMAL LOW (ref 3.87–5.11)
RDW: 14 % (ref 11.5–15.5)
WBC Count: 11.4 10*3/uL — ABNORMAL HIGH (ref 4.0–10.5)
nRBC: 0 % (ref 0.0–0.2)

## 2021-11-21 NOTE — Progress Notes (Signed)
Hematology and Oncology Follow Up Visit  Sharon Odom [id number] Sep 26, 1970 51 y.o. 11/21/2021 3:25 PM Phoebe Sharps, [name], NP-CKiker, [name], NP-C   Principle Diagnosis: 51 year old woman with ITP diagnosed in June 2023.  She presented with less than 5. .   Prior Therapy:  She received IVIG for a total of 1 g on 2 consecutive days on June 15 and November 10, 2021.  Current therapy: Prednisone 60 mg daily started in June 2023.  Interim History: Sharon Odom returns today for follow-up visit.  She is a 51 year old woman with history of multiple sclerosis hospitalized acutely on June 15 with severe thrombocytopenia without active bleeding.  She was started on oral prednisone and received IVIG with quick response and normalization of her platelet count.  Since her discharge, she feels well without any major complaints.  She denies any nausea vomiting or abdominal pain.  She denies any hematochezia or melena.  He denies any complications related to prednisone.  He denies any ecchymosis or petechiae.     Medications: I have reviewed the patient's current medications.  Current Outpatient Medications  Medication Sig Dispense Refill   albuterol (VENTOLIN HFA) 108 (90 Base) MCG/ACT inhaler Inhale 1-2 puffs into the lungs every 6 (six) hours as needed for wheezing or shortness of breath. 6.7 g 1   baclofen (LIORESAL) 20 MG tablet Take 2 tablets (40 mg total) by mouth 4 (four) times daily. Since ITB pump out 720 tablet 3   buPROPion (WELLBUTRIN XL) 300 MG 24 hr tablet TAKE 1 TABLET DAILY (Patient taking differently: Take 300 mg by mouth daily.) 90 tablet 3   carboxymethylcellulose (REFRESH PLUS) 0.5 % SOLN Place 1 drop into both eyes 3 (three) times daily as needed (dry eyes).     Cholecalciferol (VITAMIN D-3) 125 MCG (5000 UT) TABS Take 5,000 Units by mouth daily. 30 tablet 1   conjugated estrogens (PREMARIN) vaginal cream Place 1 applicator vaginally 3 (three) times a week.     Cyanocobalamin (VITAMIN  B-12) 2500 MCG SUBL Place 1 tablet (2,500 mcg total) under the tongue daily. 30 tablet 0   dalfampridine 10 MG TB12 Take 1 tablet (10 mg total) by mouth 2 (two) times daily. One po bid 180 tablet 3   diazepam (VALIUM) 5 MG tablet Take 1.5 tablets (7.5 mg total) by mouth every 8 (eight) hours. 140 tablet 5   montelukast (SINGULAIR) 10 MG tablet TAKE 1 TABLET DAILY (Patient taking differently: Take 10 mg by mouth daily after breakfast.) 90 tablet 3   Oxycodone HCl 10 MG TABS Take 1 tablet (10 mg total) by mouth every 4 (four) hours as needed. (Patient taking differently: Take 10 mg by mouth every 4 (four) hours as needed (pain).) 120 tablet 0   oxymetazoline (AFRIN) 0.05 % nasal spray Place 1 spray into both nostrils 2 (two) times daily as needed for congestion.     pantoprazole (PROTONIX) 20 MG tablet Take 1 tablet (20 mg total) by mouth daily. 30 tablet 0   polyethylene glycol (MIRALAX / GLYCOLAX) 17 g packet Take 17 g by mouth daily.     predniSONE (DELTASONE) 20 MG tablet Take 3 tablets (60 mg total) by mouth daily with breakfast. You will continue this dose until you see Dr. Juliette Alcide 60 tablet 0   senna-docusate (SENOKOT-S) 8.6-50 MG tablet Take 1 tablet by mouth daily. (Patient taking differently: Take 2 tablets by mouth daily.) 30 tablet 0   tiZANidine (ZANAFLEX) 4 MG tablet TAKE 2 TABLETS THREE TIMES A DAY (  Patient taking differently: Take 8 mg by mouth 3 (three) times daily.) 720 tablet 3   trospium (SANCTURA) 20 MG tablet Take 20 mg by mouth 2 (two) times daily.     Wheat Dextrin (BENEFIBER) POWD Take 3.5 Scoops by mouth 2 (two) times daily.     No current facility-administered medications for this visit.     Allergies:  Allergies  Allergen Reactions   Ziconotide Acetate Shortness Of Breath    Anorexia, weird sensations, could not talk, choking feeling, lost since of taste and smell. Hallucinations, vivid dreams. Confusion and sleepiness. N/v, increase in pain.  Anorexia, weird  sensations, could not talk, choking feeling, lost since of taste and smell. Hallucinations, vivid dreams. Confusion and sleepiness. N/v, increase in pain.    Morphine Other (See Comments)    Pt does not recall reaction    Amoxicillin Nausea And Vomiting   Morphine Sulfate Other (See Comments)    Unknown   Pregabalin Nausea And Vomiting      Physical Exam: Blood pressure 119/75, pulse 82, temperature 97.7 F (36.5 C), temperature source Temporal, resp. rate 16, height 5\' 9"  (1.753 m), SpO2 97 %.  ECOG: 1   General appearance: Comfortable appearing without any discomfort Head: Normocephalic without any trauma Oropharynx: Mucous membranes are moist and pink without any thrush or ulcers. Eyes: Pupils are equal and round reactive to light. Lymph nodes: No cervical, supraclavicular, inguinal or axillary lymphadenopathy.   Heart:regular rate and rhythm.  S1 and S2 without leg edema. Lung: Clear without any rhonchi or wheezes.  No dullness to percussion. Abdomin: Soft, nontender, nondistended with good bowel sounds.  No hepatosplenomegaly. Musculoskeletal: No joint deformity or effusion.  Full range of motion noted. Neurological: No deficits noted on motor, sensory and deep tendon reflex exam. Skin: No petechial rash or dryness.  Appeared moist.       Lab Results: Lab Results  Component Value Date   WBC 11.4 (H) 11/21/2021   HGB 12.0 11/21/2021   HCT 36.3 11/21/2021   MCV 96.5 11/21/2021   PLT 239 11/21/2021     Chemistry      Component Value Date/Time   NA 141 11/09/2021 0339   NA 143 04/14/2018 0841   K 4.4 11/09/2021 0339   CL 109 11/09/2021 0339   CO2 26 11/09/2021 0339   BUN 12 11/09/2021 0339   BUN 11 04/14/2018 0841   CREATININE 0.65 11/09/2021 0339      Component Value Date/Time   CALCIUM 9.4 11/09/2021 0339   ALKPHOS 68 11/09/2021 0339   AST 21 11/09/2021 0339   ALT 24 11/09/2021 0339   BILITOT 0.6 11/09/2021 0339          Impression and  Plan:   51 year old with:  1.  ITP diagnosed in June 2023 after presenting with severe thrombocytopenia and ecchymosis.  She had an excellent and quick response to prednisone and IVIG with normalization of platelet count over 200 upon discharge on 11/13/2021.  The natural course of this disease was reviewed at this time and treatment choices were discussed.  Given her complete response to therapy I recommend that prednisone taper rather quickly with 20 mg/week for the next 3 to 4 weeks.  If she has no relapse no additional therapy is needed.  Restarting prednisone may be required if she has relapsed disease.  Rituximab, splenectomy among others will be deferred for the time being.  Her platelet count continues to be normal and we will taper her prednisone rather quickly  and she will drop it by 20 mg a week she will be off her prednisone in the next 2 weeks.  If her platelet count declines in the future we will increase the dose of prednisone again.  2.  Multiple sclerosis: She continues to follow with neurology regarding this issue.  No reexacerbation noted.  3.  Follow-up: She will continue to have monthly labs done by Dr. Epimenio Foot.  We will obtain these results and follow-up in 3 months.   30  minutes were dedicated to this visit. The time was spent on reviewing laboratory data, discussing treatment options, discussing complications related to therapy and answering questions regarding future plan.    Eli Hose, MD 6/27/20233:25 PM

## 2021-11-24 ENCOUNTER — Telehealth: Payer: Self-pay

## 2021-11-24 ENCOUNTER — Other Ambulatory Visit: Payer: Self-pay | Admitting: Physical Medicine and Rehabilitation

## 2021-11-24 NOTE — Telephone Encounter (Signed)
Called pharmacy to verify that 60 tablets of prednisone was picked up by pt on 10/1921. Called patient to explain that she should have enough of the tablets to completed taper as prescribed by Dr. Clelia Croft.

## 2021-11-24 NOTE — Telephone Encounter (Signed)
Patient called requesting prescription for prednisone taper discussed during last visit with Dr. Clelia Croft.  Routed to provider.

## 2021-11-24 NOTE — Telephone Encounter (Signed)
Pt called asking if a prescription had been sent to [facility] in [facility]. According to her medical record it was escribed on 11/13/21. Pt to call pharmacy to check on availability.

## 2021-11-25 MED ORDER — OXYCODONE HCL 10 MG PO TABS: 10.0000 mg | ORAL_TABLET | ORAL | 0 refills | Status: DC | PRN

## 2021-12-04 ENCOUNTER — Encounter: Payer: Self-pay | Admitting: Physical Medicine and Rehabilitation

## 2021-12-05 ENCOUNTER — Telehealth: Payer: Self-pay | Admitting: Oncology

## 2021-12-05 NOTE — Telephone Encounter (Signed)
Called patient regarding upcoming September appointments, patient has been called and notified. 

## 2021-12-06 ENCOUNTER — Encounter: Payer: Self-pay | Admitting: Neurology

## 2021-12-14 ENCOUNTER — Other Ambulatory Visit: Payer: Self-pay | Admitting: Neurology

## 2021-12-14 DIAGNOSIS — R269 Unspecified abnormalities of gait and mobility: Secondary | ICD-10-CM

## 2021-12-14 DIAGNOSIS — G35 Multiple sclerosis: Secondary | ICD-10-CM

## 2021-12-14 MED ORDER — DALFAMPRIDINE ER 10 MG PO TB12: 10.0000 mg | ORAL_TABLET | Freq: Two times a day (BID) | ORAL | 3 refills | Status: DC

## 2021-12-27 ENCOUNTER — Telehealth: Payer: Self-pay

## 2021-12-27 NOTE — Telephone Encounter (Signed)
Patient bilateral lower extremities swollen and worse than last week. Also gave [place] a verbal order for SW.

## 2021-12-28 NOTE — Telephone Encounter (Signed)
Spoke to [place] and she states she will contact patients PCP to get advice at what to do next.

## 2021-12-29 ENCOUNTER — Encounter: Payer: Self-pay | Admitting: Physical Medicine and Rehabilitation

## 2021-12-29 ENCOUNTER — Encounter
Payer: BLUE CROSS/BLUE SHIELD | Attending: Physical Medicine and Rehabilitation | Admitting: Physical Medicine and Rehabilitation

## 2021-12-29 VITALS — BP 131/86 | HR 88 | Ht 69.0 in

## 2021-12-29 DIAGNOSIS — N319 Neuromuscular dysfunction of bladder, unspecified: Secondary | ICD-10-CM | POA: Diagnosis present

## 2021-12-29 DIAGNOSIS — R252 Cramp and spasm: Secondary | ICD-10-CM | POA: Diagnosis present

## 2021-12-29 DIAGNOSIS — R339 Retention of urine, unspecified: Secondary | ICD-10-CM | POA: Insufficient documentation

## 2021-12-29 DIAGNOSIS — G35 Multiple sclerosis: Secondary | ICD-10-CM | POA: Insufficient documentation

## 2021-12-29 MED ORDER — OXYCODONE HCL 10 MG PO TABS: 10.0000 mg | ORAL_TABLET | ORAL | 0 refills | Status: DC | PRN

## 2021-12-29 NOTE — Progress Notes (Signed)
Subjective:    Patient ID: Sharon Odom, female    DOB: 02-14-1971, 51 y.o.   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 Hoarseness AND dysarthria; not just 1 issue. Insomnia and ITB refills  ITB pump removed due to pump infection 11/10 by Dr Jake Samples- here for f/u on spasticity and progressive MS.   Cannot walk anymore- in w/c full time.  W/c has been ordered via [facility]- but is at [facility] and they haven't approved it yet.    More nerve pain in legs feet- like walking on coals- Even when not walking. From knees down, stiff-  feels like stiffness due to edema.  Feels like L leg weighs 300 lbs   Feels like needs to go to nursing home- since quality of life has really gone down hil.    Also , always cold!  Also having more edema in LE's.  Got a Dopplers of LE's- at [facility]- due to LE edema Pt think swelling is due to ITB -pump- ) I explained it's more likely due to   When had pump was put back in- leg was tingling immediately. Within "a few days"-  had swelling-   Sleeping in recliner- hasn't been in bed since surgery- not elevated to level of heart in recliner.   Has an ECHO coming up- found "water around heart"-  Swelling was gone in hospital when didn't get out of bed.  So sounds like fluid due to legs dangling and possibly due to R heart failure? Dopplers were negative for DVT's.   Has asked for records from [facility]- hasn't received them.  Hasn't had pump flipped yet- due to be refilled in October-   Also fighting a UTI. Was retaining 900cc when saw Urology- so foley was placed. 12/15/21 Urine color turned pink and had strong foul odor. Next day was red Looked like red "worms"-  Did Cx- was "nothing"- U/A was (+) and put on PO ABX.  Drinks 90-100 oz/day.   But showed me picture that looks liked literally worms-   Foley came out- was size 14 french- "went to ED- since fell  out"- and larger one was put back in- was told due to smaller foley- explained to pt was due to spasticity of bladder/bladder spasms.  Couldn't get ahold of Urology- and they didn't answer phone calls- U/A looked very slightly positive-   Spasticity also has gotten worse- and says has UTI-  U/A large Leuks and and negative Nitrites and very large urine WBC's- 93- - Urine Cx <10,000      Pain Inventory Average Pain 9 Pain Right Now 9 My pain is constant, sharp, burning, tingling, aching, and throbbing, weakness  In the last 24 hours, has pain interfered with the following? General activity 7 Relation with others 0 Enjoyment of life 7 What TIME of day is your pain at its worst? morning , daytime, evening, and night Sleep (in general) Good  Pain is worse with: bending, sitting, standing, and some activites Pain improves with: medication Relief from Meds: 2  Family History  Problem Relation Age of Onset   Diabetes Mother    Healthy Brother    Social History   Socioeconomic History   Marital status: Married    Spouse name: Not on file   Number of children: Not on file   Years of education: Not on file   Highest  education level: Not on file  Occupational History   Not on file  Tobacco Use   Smoking status: Every Day    Packs/day: 0.50    Years: 32.00    Total pack years: 16.00    Types: Cigarettes    Start date: 60   Smokeless tobacco: Never  Vaping Use   Vaping Use: Former   Quit date: 05/29/2011  Substance and Sexual Activity   Alcohol use: Not Currently    Comment: 2-3 times per wk   Drug use: Never   Sexual activity: Not Currently    Birth control/protection: Post-menopausal  Other Topics Concern   Not on file  Social History Narrative   Right handed    Caffeine: 1 cup or less per day- coffee   [employer]   Social Determinants of Health   Financial Resource Strain: Not on file  Food Insecurity: No Food Insecurity (04/03/2019)   Hunger Vital Sign     Worried About Running Out of Food in the Last Year: Never true    Ran Out of Food in the Last Year: Never true  Transportation Needs: No Transportation Needs (04/03/2019)   PRAPARE - Administrator, Civil Service (Medical): No    Lack of Transportation (Non-Medical): No  Physical Activity: Inactive (04/03/2019)   Exercise Vital Sign    Days of Exercise per Week: 0 days    Minutes of Exercise per Session: 0 min  Stress: No Stress Concern Present (04/03/2019)   [employer] of Occupational Health - Occupational Stress Questionnaire    Feeling of Stress : Not at all  Social Connections: Not on file   Past Surgical History:  Procedure Laterality Date   EXCISION MORTON'S NEUROMA Right    fallopian tube removal     INTRATHECAL PUMP IMPLANT Right 02/18/2019   Procedure: INTRATHECAL PUMP IMPLANT;  Surgeon: Odette Fraction, MD;  Location: [facility] OR;  Service: Neurosurgery;  Laterality: Right;  INTRATHECAL PUMP IMPLANT   INTRATHECAL PUMP IMPLANT Right 02/17/2021   Procedure: Baclofen Pump Replacement;  Surgeon: Maeola Harman, MD;  Location: [facility] OR;  Service: Neurosurgery;  Laterality: Right;  3C/RM 21   PAIN PUMP IMPLANTATION N/A 09/28/2021   Procedure: Insertion of baclofen pump, right lower quadrant;  Surgeon: Dawley, Alan Mulder, DO;  Location: [facility] OR;  Service: Neurosurgery;  Laterality: N/A;   PAIN PUMP REMOVAL Left 04/06/2021   Procedure: Removal of baclofen pump, Left;  Surgeon: Dawley, Alan Mulder, DO;  Location: [facility] OR;  Service: Neurosurgery;  Laterality: Left;  Lateral/Left//3C rm 19   UPPER GI ENDOSCOPY     WISDOM TOOTH EXTRACTION     WISDOM TOOTH EXTRACTION     Past Surgical History:  Procedure Laterality Date   EXCISION MORTON'S NEUROMA Right    fallopian tube removal     INTRATHECAL PUMP IMPLANT Right 02/18/2019   Procedure: INTRATHECAL PUMP IMPLANT;  Surgeon: Odette Fraction, MD;  Location: [facility] OR;  Service: Neurosurgery;  Laterality: Right;  INTRATHECAL PUMP IMPLANT   INTRATHECAL  PUMP IMPLANT Right 02/17/2021   Procedure: Baclofen Pump Replacement;  Surgeon: Maeola Harman, MD;  Location: [facility] OR;  Service: Neurosurgery;  Laterality: Right;  3C/RM 21   PAIN PUMP IMPLANTATION N/A 09/28/2021   Procedure: Insertion of baclofen pump, right lower quadrant;  Surgeon: Dawley, Alan Mulder, DO;  Location: [facility] OR;  Service: Neurosurgery;  Laterality: N/A;   PAIN PUMP REMOVAL Left 04/06/2021   Procedure: Removal of baclofen pump, Left;  Surgeon: Bethann Goo, DO;  Location: [facility] OR;  Service: Neurosurgery;  Laterality: Left;  Lateral/Left//3C rm 19   UPPER GI ENDOSCOPY     WISDOM TOOTH EXTRACTION     WISDOM TOOTH EXTRACTION     Past Medical History:  Diagnosis Date   Allergies    Anxiety    Arthritis    Asthma    seasonal - pollen   Constipation    Depression    Dyspnea    with exertion   GERD (gastroesophageal reflux disease)    Headache    History of hiatal hernia    Left radial nerve palsy 03/15/2020   resolved - Left arm/hand weak   Multiple sclerosis (HCC)    Pneumonia 2007   x 1   Vision abnormalities    Wears glasses    BP 131/86   Pulse 88   Ht 5\' 9"  (1.753 m)   SpO2 96%   BMI 19.20 kg/m   Opioid Risk Score:   Fall Risk Score:  `1  Depression screen PHQ 2/9     12/29/2021    2:23 PM 09/22/2021    1:48 PM 06/16/2021    1:43 PM 04/17/2021    2:04 PM 12/21/2020   12:07 PM 08/08/2020    3:30 PM 02/08/2020    2:10 PM  Depression screen PHQ 2/9  Decreased Interest 3 1 0 0 0 1 1  Down, Depressed, Hopeless 3 1 0 0 0 1 1  PHQ - 2 Score 6 2 0 0 0 2 2     Review of Systems  Constitutional: Negative.   HENT: Negative.    Eyes: Negative.   Respiratory: Negative.    Cardiovascular: Negative.   Gastrointestinal: Negative.   Endocrine: Negative.   Genitourinary: Negative.   Musculoskeletal:  Positive for gait problem.  Skin: Negative.   Allergic/Immunologic: Negative.   Neurological:  Positive for weakness.  Hematological: Negative.    Psychiatric/Behavioral:  Positive for dysphoric mood.       Objective:   Physical Exam  Awake, alert, chronic vocal issues; in manual w/c; accompanied by husband, NAD  3+ edema on LLE and 2+ edema-pitting on RLE.    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 Hoarseness AND dysarthria; not just 1 issue. Insomnia and ITB refills  ITB pump removed due to pump infection 11/10 by Dr 13/10- here for f/u on spasticity and progressive MS.  Jake Samples- [phone]- can call him about foot pedals/leg rests, but I don't think they can "elevate out of the way". Spoke ot [place] about power w/c  2. Have called Dr [place] to get pt in since needs to get her pump flipped surgically-will get her scheduled ASAP per Dr Jake Samples.    3. ITB is at 374 mcg/day- currently. Will keep it at current dose-    4. Needs to speak with Urology- at [facility]- Dr [facility]- is a NP- not physician- strongly suggest since pt has Neurogenic bladder, needs to see a physician. - Dr Kinnie Feil- At [facility] which is [place], [place]- Dr [place]; the Urology I like the most- is Dr Alfredo Martinez (real name is Hall Busing) at [facility] in [place], [place]- will place Referral for Dr [place] Will need a foley catheter for the foreseeable  future- cannot cath herself.   5. Has H/H- can ask them to send me an order for Social work- Therapist, art. I cannot place new order- will mess up the order   6. Access [place] or [facility] bus- to see if can be transported to appointments here in [place], [place] I can fill out paperwork for this.   7.  Will NOT titrate up on ITB pump for now. Wait since tone so low.   8. Reduce Po Baclofen - wean baclofen to 30 mg 3x/day for 1-2 weeks, then to 20 mg 3x/day- based on symptoms.   9. Oxycodone refill 10 mg #120- is due  for refill.   10. Will call Leann about not titrating up on ITB pump for now and wait on Botox.   11. F/U in 6 weeks- wait list- but double list- 3 months for f/u on ITB  I spent a total of 57   minutes on total care today- >50% coordination of care- due to as detailed above- called Barbara Cower and Dr Dawley from room as well as Dr Katrinka Blazing.

## 2021-12-29 NOTE — Patient Instructions (Signed)
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 Hoarseness AND dysarthria; not just 1 issue. Insomnia and ITB refills  ITB pump removed due to pump infection 11/10 by Dr Jake Samples- here for f/u on spasticity and progressive MS.  Filbert Berthold- [phone]- can call him about foot pedals/leg rests, but I don't think they can "elevate out of the way". Spoke ot [place] about power w/c  2. Have called Dr Jake Samples to get pt in since needs to get her pump flipped surgically-will get her scheduled ASAP per Dr Jake Samples.    3. ITB is at 374 mcg/day- currently. Will keep it at current dose-    4. Needs to speak with Urology- at [facility]- Dr Kinnie Feil- is a NP- not physician- strongly suggest since pt has Neurogenic bladder, needs to see a physician. - Dr Cherly Anderson- At [facility] which is [place], [place]- Dr Alfredo Martinez; the Urology I like the most- is Dr Hall Busing (real name is Jonny Ruiz) at [facility] in [place], [place]- will place Referral for Dr Katrinka Blazing Will need a foley catheter for the foreseeable future- cannot cath herself.   5. Has H/H- can ask them to send me an order for Social work- Therapist, art. I cannot place new order- will mess up the order   6. Access [place] or [facility] bus- to see if can be transported to appointments here in [place], [place] I can fill out paperwork for this.   7.  Will NOT titrate up on ITB pump for now. Wait since tone so low.   8. Reduce Po Baclofen - wean baclofen to 30 mg 3x/day for 1-2 weeks, then to 20 mg 3x/day- based on symptoms.   9. Oxycodone refill 10 mg #120- is due for refill.   10. Will call Leann about not titrating up on ITB pump for now and wait on Botox.   11. F/U in 6 weeks- wait list- but double list- 3 months for f/u on ITB

## 2022-01-01 ENCOUNTER — Other Ambulatory Visit: Payer: Self-pay | Admitting: Neurological Surgery

## 2022-01-01 ENCOUNTER — Encounter: Payer: Self-pay | Admitting: Neurology

## 2022-01-02 NOTE — Telephone Encounter (Signed)
Called pt. She will come today around 1:30pm for 1G IV solumedrol and will scheduled tomorrows infusion with infusion suite while she is here. Order provided to infusion suite.

## 2022-01-04 ENCOUNTER — Encounter: Payer: Self-pay | Admitting: Neurology

## 2022-01-05 ENCOUNTER — Telehealth: Payer: Self-pay | Admitting: Neurology

## 2022-01-05 ENCOUNTER — Other Ambulatory Visit: Payer: Self-pay | Admitting: Neurological Surgery

## 2022-01-05 NOTE — Telephone Encounter (Signed)
Pt cancelling Botox appt for 01/10/22 due to having surgery, but would like to reschedule. Would like a call back.

## 2022-01-07 NOTE — Telephone Encounter (Signed)
Can you please call her to reschedule, thanks!

## 2022-01-08 ENCOUNTER — Encounter: Payer: Self-pay | Admitting: Physical Medicine and Rehabilitation

## 2022-01-08 ENCOUNTER — Telehealth: Payer: Self-pay | Admitting: Neurology

## 2022-01-08 ENCOUNTER — Encounter: Payer: Self-pay | Admitting: Neurology

## 2022-01-08 ENCOUNTER — Other Ambulatory Visit: Payer: Self-pay | Admitting: Neurosurgery

## 2022-01-08 NOTE — Telephone Encounter (Signed)
FYI pt has called back and r/s her Botox with Dr Terrace Arabia for 08-23 at 2:00 for a 1:30 check in

## 2022-01-08 NOTE — Telephone Encounter (Signed)
LVM and sent mychart msg asking pt to give me a call to reschedule 8/16 botox appointment

## 2022-01-09 ENCOUNTER — Other Ambulatory Visit: Payer: Self-pay

## 2022-01-09 ENCOUNTER — Encounter (HOSPITAL_COMMUNITY): Payer: Self-pay | Admitting: Neurological Surgery

## 2022-01-09 NOTE — Progress Notes (Addendum)
PCP - Babette Relic Cardiologist - No EKG -  Chest x-ray -  ECHO - 01/02/22 Cardiac Cath - Denies CPAP - No Fasting Blood Sugar:  No Diabetes Checks Blood Sugar:  /day Blood Thinner Instructions: Denies Aspirin Instructions: Denies ERAS Protcol - Yes can have clears until 1030 am  COVID TEST- NI  Anesthesia review: No  -------------  SDW INSTRUCTIONS:  Your procedure is scheduled on 01/11/22 Thursday. Please report to [facility] Main Entrance "A" at 11 A.M., and check in at the Admitting office. Call this number if you have problems the morning of surgery: [phone]   Remember: Do not eat anything after midnight the night before your surgery  You may drink clear liquids until 1030 am the morning of your surgery.   Clear liquids allowed are: Water, Non-Citrus Juices (without pulp), Carbonated Beverages, Clear Tea, Black Coffee Only, and Gatorade   Medications to take morning of surgery with a sip of water include:  Baclofen, Wellbutrin, Dalfampridine, Valium, Singulair, Zanaflex, sanctura, Zantac IF needed: Afrin, Oxycodone, Refresh eye drop  As of today, STOP taking any Aspirin (unless otherwise instructed by your surgeon), Aleve, Naproxen, Ibuprofen, Motrin, Advil, Goody's, BC's, all herbal medications, fish oil, and all vitamins.    The Morning of Surgery Do not wear jewelry, make-up or nail polish. Do not wear lotions, powders, or perfumes/colognes, or deodorant Do not bring valuables to the hospital. [facility] is not responsible for any belongings or valuables.  If you are a smoker, DO NOT Smoke 24 hours prior to surgery  If you wear a CPAP at night please bring your mask the morning of surgery   Remember that you must have someone to transport you home after your surgery, and remain with you for 24 hours if you are discharged the same day.  Please bring cases for contacts, glasses, hearing aids, dentures or bridgework because it cannot be worn into surgery.    Patients discharged the day of surgery will not be allowed to drive home.   Please shower the NIGHT BEFORE/MORNING OF SURGERY (use antibacterial soap like DIAL soap if possible). Wear comfortable clothes the morning of surgery. Oral Hygiene is also important to reduce your risk of infection.  Remember - BRUSH YOUR TEETH THE MORNING OF SURGERY WITH YOUR REGULAR TOOTHPASTE  Patient denies shortness of breath, fever, cough and chest pain.

## 2022-01-09 NOTE — Telephone Encounter (Signed)
I called [facility] to check the status of the botox delivery. The RX was received on 01/04/2022 and they have 5-8 business days to process and then will call the patient to obtain consent and then schedule delivery. Will continue to follow.

## 2022-01-10 ENCOUNTER — Ambulatory Visit: Payer: Medicare Other | Admitting: Neurology

## 2022-01-10 MED ORDER — DIAZEPAM 5 MG PO TABS: 7.5000 mg | ORAL_TABLET | Freq: Three times a day (TID) | ORAL | 5 refills | Status: DC

## 2022-01-11 ENCOUNTER — Ambulatory Visit (HOSPITAL_COMMUNITY): Payer: BLUE CROSS/BLUE SHIELD | Admitting: Anesthesiology

## 2022-01-11 ENCOUNTER — Encounter: Payer: Self-pay | Admitting: Neurology

## 2022-01-11 ENCOUNTER — Ambulatory Visit (HOSPITAL_COMMUNITY)
Admission: RE | Admit: 2022-01-11 | Discharge: 2022-01-11 | Disposition: A | Discharge status: Home / Self Care | Payer: BLUE CROSS/BLUE SHIELD | Attending: Neurological Surgery | Admitting: Neurological Surgery

## 2022-01-11 ENCOUNTER — Encounter (HOSPITAL_COMMUNITY)
Admission: RE | Disposition: A | Discharge status: Home / Self Care | Payer: Self-pay | Source: Home / Self Care | Attending: Neurological Surgery

## 2022-01-11 ENCOUNTER — Other Ambulatory Visit: Payer: Self-pay

## 2022-01-11 ENCOUNTER — Encounter (HOSPITAL_COMMUNITY): Payer: Self-pay | Admitting: Neurological Surgery

## 2022-01-11 DIAGNOSIS — T85615A Breakdown (mechanical) of other nervous system device, implant or graft, initial encounter: Secondary | ICD-10-CM | POA: Diagnosis not present

## 2022-01-11 DIAGNOSIS — G8110 Spastic hemiplegia affecting unspecified side: Secondary | ICD-10-CM | POA: Diagnosis not present

## 2022-01-11 DIAGNOSIS — Z79899 Other long term (current) drug therapy: Secondary | ICD-10-CM | POA: Diagnosis not present

## 2022-01-11 DIAGNOSIS — G35 Multiple sclerosis: Secondary | ICD-10-CM | POA: Diagnosis not present

## 2022-01-11 DIAGNOSIS — K219 Gastro-esophageal reflux disease without esophagitis: Secondary | ICD-10-CM | POA: Diagnosis not present

## 2022-01-11 DIAGNOSIS — Y752 Prosthetic and other implants, materials and neurological devices associated with adverse incidents: Secondary | ICD-10-CM | POA: Insufficient documentation

## 2022-01-11 DIAGNOSIS — Z993 Dependence on wheelchair: Secondary | ICD-10-CM | POA: Insufficient documentation

## 2022-01-11 DIAGNOSIS — F17210 Nicotine dependence, cigarettes, uncomplicated: Secondary | ICD-10-CM | POA: Diagnosis not present

## 2022-01-11 HISTORY — PX: INTRATHECAL PUMP REVISION: SHX6810

## 2022-01-11 LAB — BASIC METABOLIC PANEL
Anion gap: 6 (ref 5–15)
BUN: 10 mg/dL (ref 6–20)
CO2: 29 mmol/L (ref 22–32)
Calcium: 9.3 mg/dL (ref 8.9–10.3)
Chloride: 104 mmol/L (ref 98–111)
Creatinine, Ser: 0.83 mg/dL (ref 0.44–1.00)
GFR, Estimated: >60 mL/min (ref >60)
Glucose, Bld: 92 mg/dL (ref 70–99)
Potassium: 3.6 mmol/L (ref 3.5–5.1)
Sodium: 139 mmol/L (ref 135–145)

## 2022-01-11 LAB — URINALYSIS, ROUTINE W REFLEX MICROSCOPIC
Bacteria, UA: NONE SEEN
Bilirubin Urine: NEGATIVE
Glucose, UA: NEGATIVE mg/dL
Ketones, ur: NEGATIVE mg/dL
Nitrite: NEGATIVE
Protein, ur: NEGATIVE mg/dL
Specific Gravity, Urine: 1.005 (ref 1.005–1.030)
pH: 8 (ref 5.0–8.0)

## 2022-01-11 LAB — CBC
HCT: 39.4 % (ref 36.0–46.0)
Hemoglobin: 12.7 g/dL (ref 12.0–15.0)
MCH: 31.7 pg (ref 26.0–34.0)
MCHC: 32.2 g/dL (ref 30.0–36.0)
MCV: 98.3 fL (ref 80.0–100.0)
Platelets: 228 10*3/uL (ref 150–400)
RBC: 4.01 MIL/uL (ref 3.87–5.11)
RDW: 12 % (ref 11.5–15.5)
WBC: 5.6 10*3/uL (ref 4.0–10.5)
nRBC: 0 % (ref 0.0–0.2)

## 2022-01-11 LAB — ABO/RH: ABO/RH(D): A POS

## 2022-01-11 LAB — TYPE AND SCREEN
ABO/RH(D): A POS
Antibody Screen: NEGATIVE

## 2022-01-11 SURGERY — INTRATHECAL PUMP REVISION
Anesthesia: General

## 2022-01-11 MED ORDER — PHENYLEPHRINE 80 MCG/ML (10ML) SYRINGE FOR IV PUSH (FOR BLOOD PRESSURE SUPPORT)
PREFILLED_SYRINGE | INTRAVENOUS | Status: DC | PRN
  Administered 2022-01-11: 120 ug via INTRAVENOUS

## 2022-01-11 MED ORDER — ACETAMINOPHEN 10 MG/ML IV SOLN
INTRAVENOUS | Status: AC
  Filled 2022-01-11: qty 100

## 2022-01-11 MED ORDER — FENTANYL CITRATE (PF) 100 MCG/2ML IJ SOLN: 25.0000 ug | INTRAMUSCULAR | Status: DC | PRN

## 2022-01-11 MED ORDER — FENTANYL CITRATE (PF) 250 MCG/5ML IJ SOLN
INTRAMUSCULAR | Status: AC
  Filled 2022-01-11: qty 5

## 2022-01-11 MED ORDER — THROMBIN 5000 UNITS EX SOLR
CUTANEOUS | Status: AC
  Filled 2022-01-11: qty 10000

## 2022-01-11 MED ORDER — ONDANSETRON HCL 4 MG/2ML IJ SOLN
INTRAMUSCULAR | Status: DC | PRN
  Administered 2022-01-11: 4 mg via INTRAVENOUS

## 2022-01-11 MED ORDER — VANCOMYCIN HCL IN DEXTROSE 1-5 GM/200ML-% IV SOLN
1000.0000 mg | INTRAVENOUS | Status: AC
  Administered 2022-01-11: 1000 mg via INTRAVENOUS
  Filled 2022-01-11: qty 200

## 2022-01-11 MED ORDER — PROMETHAZINE HCL 25 MG/ML IJ SOLN: 6.2500 mg | INTRAMUSCULAR | Status: DC | PRN

## 2022-01-11 MED ORDER — SULFAMETHOXAZOLE-TRIMETHOPRIM 800-160 MG PO TABS
1.0000 | ORAL_TABLET | Freq: Two times a day (BID) | ORAL | 0 refills | Status: DC
Start: 2022-01-11 — End: 2022-01-17

## 2022-01-11 MED ORDER — 0.9 % SODIUM CHLORIDE (POUR BTL) OPTIME
TOPICAL | Status: DC | PRN
  Administered 2022-01-11: 1000 mL

## 2022-01-11 MED ORDER — MIDAZOLAM HCL 2 MG/2ML IJ SOLN
INTRAMUSCULAR | Status: AC
  Filled 2022-01-11: qty 2

## 2022-01-11 MED ORDER — PROPOFOL 10 MG/ML IV BOLUS
INTRAVENOUS | Status: DC | PRN
  Administered 2022-01-11: 140 mg via INTRAVENOUS

## 2022-01-11 MED ORDER — ACETAMINOPHEN 500 MG PO TABS: 1000.0000 mg | ORAL_TABLET | Freq: Once | ORAL | Status: DC

## 2022-01-11 MED ORDER — LIDOCAINE 2% (20 MG/ML) 5 ML SYRINGE
INTRAMUSCULAR | Status: DC | PRN
  Administered 2022-01-11: 30 mg via INTRAVENOUS

## 2022-01-11 MED ORDER — ROCURONIUM BROMIDE 10 MG/ML (PF) SYRINGE
PREFILLED_SYRINGE | INTRAVENOUS | Status: DC | PRN
  Administered 2022-01-11: 40 mg via INTRAVENOUS

## 2022-01-11 MED ORDER — LACTATED RINGERS IV SOLN: INTRAVENOUS | Status: DC

## 2022-01-11 MED ORDER — VANCOMYCIN HCL 1000 MG IV SOLR
INTRAVENOUS | Status: DC | PRN
  Administered 2022-01-11: 1000 mg

## 2022-01-11 MED ORDER — VANCOMYCIN HCL 1000 MG IV SOLR
INTRAVENOUS | Status: AC
  Filled 2022-01-11: qty 20

## 2022-01-11 MED ORDER — ACETAMINOPHEN 10 MG/ML IV SOLN
INTRAVENOUS | Status: DC | PRN
  Administered 2022-01-11: 1000 mg via INTRAVENOUS

## 2022-01-11 MED ORDER — LIDOCAINE-EPINEPHRINE 1 %-1:100000 IJ SOLN
INTRAMUSCULAR | Status: AC
  Filled 2022-01-11: qty 1

## 2022-01-11 MED ORDER — LIDOCAINE-EPINEPHRINE 1 %-1:100000 IJ SOLN
INTRAMUSCULAR | Status: DC | PRN
  Administered 2022-01-11: 9 mL

## 2022-01-11 MED ORDER — FENTANYL CITRATE (PF) 250 MCG/5ML IJ SOLN
INTRAMUSCULAR | Status: DC | PRN
  Administered 2022-01-11: 25 ug via INTRAVENOUS

## 2022-01-11 MED ORDER — CHLORHEXIDINE GLUCONATE 0.12 % MT SOLN
15.0000 mL | Freq: Once | OROMUCOSAL | Status: AC
  Administered 2022-01-11: 15 mL via OROMUCOSAL
  Filled 2022-01-11: qty 15

## 2022-01-11 MED ORDER — MEPERIDINE HCL 25 MG/ML IJ SOLN: 6.2500 mg | INTRAMUSCULAR | Status: DC | PRN

## 2022-01-11 MED ORDER — CHLORHEXIDINE GLUCONATE CLOTH 2 % EX PADS: 6.0000 | MEDICATED_PAD | Freq: Once | CUTANEOUS | Status: DC

## 2022-01-11 MED ORDER — ORAL CARE MOUTH RINSE: 15.0000 mL | Freq: Once | OROMUCOSAL | Status: AC

## 2022-01-11 MED ORDER — BUPIVACAINE HCL (PF) 0.5 % IJ SOLN
INTRAMUSCULAR | Status: AC
  Filled 2022-01-11: qty 30

## 2022-01-11 MED ORDER — PROPOFOL 10 MG/ML IV BOLUS
INTRAVENOUS | Status: AC
  Filled 2022-01-11: qty 20

## 2022-01-11 SURGICAL SUPPLY — 70 items
BAG COUNTER SPONGE SURGICOUNT (BAG) ×1 IMPLANT
BLADE CLIPPER SURG (BLADE) ×1 IMPLANT
BLADE SURG 10 STRL SS (BLADE) ×2 IMPLANT
BLADE SURG 15 STRL LF DISP TIS (BLADE) ×1 IMPLANT
BLADE SURG 15 STRL SS (BLADE)
BOOT SUTURE AID YELLOW STND (SUTURE) ×1 IMPLANT
CABLE BIPOLOR RESECTION CORD (MISCELLANEOUS) ×1 IMPLANT
CANISTER SUCT 3000ML PPV (MISCELLANEOUS) ×1 IMPLANT
COVER MAYO STAND STRL (DRAPES) ×1 IMPLANT
DERMABOND ADHESIVE PROPEN (GAUZE/BANDAGES/DRESSINGS) ×1
DERMABOND ADVANCED (GAUZE/BANDAGES/DRESSINGS) ×1
DERMABOND ADVANCED .7 DNX12 (GAUZE/BANDAGES/DRESSINGS) ×1 IMPLANT
DERMABOND ADVANCED .7 DNX6 (GAUZE/BANDAGES/DRESSINGS) IMPLANT
DRAPE C-ARM 42X72 X-RAY (DRAPES) ×1 IMPLANT
DRAPE INCISE IOBAN 85X60 (DRAPES) ×1 IMPLANT
DRAPE LAPAROTOMY 100X72X124 (DRAPES) ×1 IMPLANT
DRSG OPSITE POSTOP 3X4 (GAUZE/BANDAGES/DRESSINGS) IMPLANT
DRSG OPSITE POSTOP 4X6 (GAUZE/BANDAGES/DRESSINGS) IMPLANT
DRSG OPSITE POSTOP 4X8 (GAUZE/BANDAGES/DRESSINGS) IMPLANT
DRSG PAD ABDOMINAL 8X10 ST (GAUZE/BANDAGES/DRESSINGS) IMPLANT
ELECT BLADE INSULATED 4IN (ELECTROSURGICAL) ×1
ELECT REM PT RETURN 9FT ADLT (ELECTROSURGICAL) ×1
ELECTRODE BLADE INSULATED 4IN (ELECTROSURGICAL) ×1 IMPLANT
ELECTRODE REM PT RTRN 9FT ADLT (ELECTROSURGICAL) ×1 IMPLANT
GAUZE 4X4 16PLY ~~LOC~~+RFID DBL (SPONGE) ×1 IMPLANT
GAUZE SPONGE 4X4 12PLY STRL (GAUZE/BANDAGES/DRESSINGS) IMPLANT
GLOVE BIO SURGEON STRL SZ8 (GLOVE) ×1 IMPLANT
GLOVE BIOGEL PI IND STRL 8 (GLOVE) ×1 IMPLANT
GLOVE BIOGEL PI IND STRL 8.5 (GLOVE) ×1 IMPLANT
GLOVE BIOGEL PI INDICATOR 8 (GLOVE) ×1
GLOVE BIOGEL PI INDICATOR 8.5 (GLOVE) ×1
GLOVE ECLIPSE 8.0 STRL XLNG CF (GLOVE) ×1 IMPLANT
GLOVE EXAM NITRILE XL STR (GLOVE) IMPLANT
GLOVE SURG ENC MOIS LTX SZ8 (GLOVE) ×1 IMPLANT
GOWN STRL REUS W/ TWL LRG LVL3 (GOWN DISPOSABLE) IMPLANT
GOWN STRL REUS W/ TWL XL LVL3 (GOWN DISPOSABLE) ×1 IMPLANT
GOWN STRL REUS W/TWL 2XL LVL3 (GOWN DISPOSABLE) IMPLANT
GOWN STRL REUS W/TWL LRG LVL3 (GOWN DISPOSABLE)
GOWN STRL REUS W/TWL XL LVL3 (GOWN DISPOSABLE) ×1
KIT BASIN OR (CUSTOM PROCEDURE TRAY) ×1 IMPLANT
KIT TURNOVER KIT B (KITS) ×1 IMPLANT
NDL HYPO 18GX1.5 BLUNT FILL (NEEDLE) IMPLANT
NDL HYPO 25X1 1.5 SAFETY (NEEDLE) ×1 IMPLANT
NEEDLE HYPO 18GX1.5 BLUNT FILL (NEEDLE) IMPLANT
NEEDLE HYPO 25X1 1.5 SAFETY (NEEDLE) ×1 IMPLANT
NS IRRIG 1000ML POUR BTL (IV SOLUTION) ×1 IMPLANT
PACK EENT II TURBAN DRAPE (CUSTOM PROCEDURE TRAY) ×1 IMPLANT
PACK LAMINECTOMY NEURO (CUSTOM PROCEDURE TRAY) IMPLANT
PATTIES SURGICAL .5 X.5 (GAUZE/BANDAGES/DRESSINGS) IMPLANT
PATTIES SURGICAL 1X1 (DISPOSABLE) IMPLANT
PENCIL BUTTON HOLSTER BLD 10FT (ELECTRODE) ×1 IMPLANT
SPIKE FLUID TRANSFER (MISCELLANEOUS) ×1 IMPLANT
SPONGE SURGIFOAM ABS GEL SZ50 (HEMOSTASIS) IMPLANT
SPONGE T-LAP 4X18 ~~LOC~~+RFID (SPONGE) ×1 IMPLANT
STAPLER VISISTAT 35W (STAPLE) ×1 IMPLANT
SUT BONE WAX W31G (SUTURE) IMPLANT
SUT PROLENE 2 0 SH DA (SUTURE) IMPLANT
SUT PROLENE 3 0 PS 2 (SUTURE) ×1 IMPLANT
SUT SILK 2 0 TIES 10X30 (SUTURE) ×1 IMPLANT
SUT VIC AB 0 CT1 18XCR BRD8 (SUTURE) ×2 IMPLANT
SUT VIC AB 0 CT1 8-18 (SUTURE) ×1
SUT VIC AB 2-0 CP2 18 (SUTURE) ×2 IMPLANT
SUT VIC AB 3-0 SH 8-18 (SUTURE) ×3 IMPLANT
SYR 20CC LL (SYRINGE) ×1 IMPLANT
SYR 3ML LL SCALE MARK (SYRINGE) ×1 IMPLANT
SYR CONTROL 10ML LL (SYRINGE) ×1 IMPLANT
TOWEL GREEN STERILE (TOWEL DISPOSABLE) ×1 IMPLANT
TOWEL GREEN STERILE FF (TOWEL DISPOSABLE) ×1 IMPLANT
TUBE CONNECTING 12X1/4 (SUCTIONS) ×1 IMPLANT
WATER STERILE IRR 1000ML POUR (IV SOLUTION) ×1 IMPLANT

## 2022-01-11 NOTE — Transfer of Care (Signed)
Immediate Anesthesia Transfer of Care Note  Patient: Sharon Odom  Procedure(s) Performed: Baclofen Pump revision  Patient Location: PACU  Anesthesia Type:General  Level of Consciousness: awake and alert   Airway & Oxygen Therapy: Patient Spontanous Breathing and Patient connected to nasal cannula oxygen  Post-op Assessment: Report given to RN and Post -op Vital signs reviewed and stable  Post vital signs: Reviewed and stable  Last Vitals:  Vitals Value Taken Time  BP 112/62 01/11/22 1745  Temp 36.4 C 01/11/22 1745  Pulse 101 01/11/22 1750  Resp 22 01/11/22 1750  SpO2 97 % 01/11/22 1750  Vitals shown include unvalidated device data.  Last Pain:  Vitals:   01/11/22 1745  TempSrc:   PainSc: Asleep      Patients Stated Pain Goal: 3 (01/11/22 1222)  Complications: No notable events documented.

## 2022-01-11 NOTE — H&P (Signed)
Providing Compassionate, Quality Care - Together  NEUROSURGERY HISTORY & PHYSICAL   Sharon Odom is an 51 y.o. female.   Chief Complaint: Baclofen pump malfunction HPI: This is a 51 year old female with a history of multiple sclerosis, status post intrathecal baclofen pump placement in May 2023 by myself.  Unfortunately due to her significant weight loss her pump has flipped upside down and is unable to be refilled and therefore she presents today for abdominal revision in order to orient her pump in the correct position.  She has been having some difficulty with her dosage which is managed by Dr. Berline Chough.   Past Medical History:  Diagnosis Date   Allergies    Anxiety    Arthritis    Asthma    seasonal - pollen   Constipation    Depression    Dyspnea    with exertion   GERD (gastroesophageal reflux disease)    Headache    History of hiatal hernia    Left radial nerve palsy 03/15/2020   resolved - Left arm/hand weak   Multiple sclerosis (HCC)    Pneumonia 2007   x 1   Vision abnormalities    Wears glasses     Past Surgical History:  Procedure Laterality Date   COLONOSCOPY     EXCISION MORTON'S NEUROMA Right    fallopian tube removal     INTRATHECAL PUMP IMPLANT Right 02/18/2019   Procedure: INTRATHECAL PUMP IMPLANT;  Surgeon: Odette Fraction, MD;  Location: [facility] OR;  Service: Neurosurgery;  Laterality: Right;  INTRATHECAL PUMP IMPLANT   INTRATHECAL PUMP IMPLANT Right 02/17/2021   Procedure: Baclofen Pump Replacement;  Surgeon: Maeola Harman, MD;  Location: [facility] OR;  Service: Neurosurgery;  Laterality: Right;  3C/RM 21   PAIN PUMP IMPLANTATION N/A 09/28/2021   Procedure: Insertion of baclofen pump, right lower quadrant;  Surgeon: Velma Agnes, Alan Mulder, DO;  Location: [facility] OR;  Service: Neurosurgery;  Laterality: N/A;   PAIN PUMP REMOVAL Left 04/06/2021   Procedure: Removal of baclofen pump, Left;  Surgeon: Bearett Porcaro, Alan Mulder, DO;  Location: [facility] OR;  Service: Neurosurgery;  Laterality:  Left;  Lateral/Left//3C rm 19   UPPER GI ENDOSCOPY     WISDOM TOOTH EXTRACTION     WISDOM TOOTH EXTRACTION      Family History  Problem Relation Age of Onset   Diabetes Mother    Healthy Brother    Social History:  reports that she has been smoking cigarettes. She started smoking about 35 years ago. She has a 16.00 pack-year smoking history. She has never used smokeless tobacco. She reports that she does not currently use alcohol. She reports that she does not use drugs.  Allergies:  Allergies  Allergen Reactions   Ziconotide Acetate Shortness Of Breath    Anorexia, weird sensations, could not talk, choking feeling, lost since of taste and smell. Hallucinations, vivid dreams. Confusion and sleepiness. N/v, increase in pain.  Anorexia, weird sensations, could not talk, choking feeling, lost since of taste and smell. Hallucinations, vivid dreams. Confusion and sleepiness. N/v, increase in pain.    Morphine Other (See Comments)    Pt does not recall reaction    Amoxicillin Nausea And Vomiting   Lyrica [Pregabalin] Nausea And Vomiting    Medications Prior to Admission  Medication Sig Dispense Refill   acetaminophen (TYLENOL) 500 MG tablet Take 1,000 mg by mouth every 6 (six) hours as needed for moderate pain.     Ascorbic Acid (VITAMIN C) 1000 MG tablet Take 1,000  mg by mouth daily.     baclofen (LIORESAL) 20 MG tablet Take 2 tablets (40 mg total) by mouth 4 (four) times daily. Since ITB pump out (Patient taking differently: Take 20 mg by mouth 3 (three) times daily. Since ITB pump out) 720 tablet 3   buPROPion (WELLBUTRIN XL) 300 MG 24 hr tablet TAKE 1 TABLET DAILY (Patient taking differently: Take 300 mg by mouth daily.) 90 tablet 3   carboxymethylcellulose (REFRESH PLUS) 0.5 % SOLN Place 1 drop into both eyes 3 (three) times daily as needed (dry eyes).     Cholecalciferol (VITAMIN D-3) 125 MCG (5000 UT) TABS Take 5,000 Units by mouth daily. 30 tablet 1   conjugated estrogens  (PREMARIN) vaginal cream Place 1 Application vaginally 3 (three) times a week.     Cyanocobalamin (VITAMIN B-12) 2500 MCG SUBL Place 1 tablet (2,500 mcg total) under the tongue daily. 30 tablet 0   dalfampridine 10 MG TB12 Take 1 tablet (10 mg total) by mouth 2 (two) times daily. One po bid 180 tablet 3   diazepam (VALIUM) 5 MG tablet Take 1.5 tablets (7.5 mg total) by mouth every 8 (eight) hours. 140 tablet 5   famotidine (PEPCID) 10 MG tablet Take 10 mg by mouth daily as needed for heartburn or indigestion.     furosemide (LASIX) 40 MG tablet Take 40 mg by mouth daily.     montelukast (SINGULAIR) 10 MG tablet TAKE 1 TABLET DAILY (Patient taking differently: Take 10 mg by mouth daily after breakfast.) 90 tablet 3   Oxycodone HCl 10 MG TABS Take 1 tablet (10 mg total) by mouth every 4 (four) hours as needed. 120 tablet 0   oxymetazoline (AFRIN) 0.05 % nasal spray Place 1 spray into both nostrils 2 (two) times daily as needed for congestion.     polyethylene glycol (MIRALAX / GLYCOLAX) 17 g packet Take 17 g by mouth daily.     senna-docusate (SENOKOT-S) 8.6-50 MG tablet Take 1 tablet by mouth daily. (Patient taking differently: Take 2 tablets by mouth daily.) 30 tablet 0   tiZANidine (ZANAFLEX) 4 MG tablet TAKE 2 TABLETS THREE TIMES A DAY (Patient taking differently: Take 8 mg by mouth 3 (three) times daily.) 720 tablet 3   trospium (SANCTURA) 20 MG tablet Take 20 mg by mouth 2 (two) times daily.     Wheat Dextrin (BENEFIBER) POWD Take 3.5 Scoops by mouth 2 (two) times daily.     albuterol (VENTOLIN HFA) 108 (90 Base) MCG/ACT inhaler Inhale 1-2 puffs into the lungs every 6 (six) hours as needed for wheezing or shortness of breath. 6.7 g 1   pantoprazole (PROTONIX) 20 MG tablet Take 1 tablet (20 mg total) by mouth daily. (Patient not taking: Reported on 01/10/2022) 30 tablet 0   predniSONE (DELTASONE) 20 MG tablet Take 3 tablets (60 mg total) by mouth daily with breakfast. You will continue this dose  until you see Dr. Osker Mason (Patient not taking: Reported on 01/10/2022) 60 tablet 0    Results for orders placed or performed during the hospital encounter of 01/11/22 (from the past 48 hour(s))  ABO/Rh     Status: None   Collection Time: 01/11/22 12:00 PM  Result Value Ref Range   ABO/RH(D)      A POS Performed at [facility], [place] [address], [place],  [address]   Type and screen     Status: None   Collection Time: 01/11/22 12:05 PM  Result Value Ref Range  ABO/RH(D) A POS    Antibody Screen NEG    Sample Expiration      01/14/2022,2359 Performed at [facility], [address], [place], [place] [address]   CBC per protocol     Status: None   Collection Time: 01/11/22 12:19 PM  Result Value Ref Range   WBC 5.6 4.0 - 10.5 K/uL   RBC 4.01 3.87 - 5.11 MIL/uL   Hemoglobin 12.7 12.0 - 15.0 g/dL   HCT 65.7 84.6 - 96.2 %   MCV 98.3 80.0 - 100.0 fL   MCH 31.7 26.0 - 34.0 pg   MCHC 32.2 30.0 - 36.0 g/dL   RDW 95.2 84.1 - 32.4 %   Platelets 228 150 - 400 K/uL   nRBC 0.0 0.0 - 0.2 %    Comment: Performed at [facility], [address], [place], [place] [address]  Basic metabolic panel per protocol     Status: None   Collection Time: 01/11/22 12:19 PM  Result Value Ref Range   Sodium 139 135 - 145 mmol/L   Potassium 3.6 3.5 - 5.1 mmol/L   Chloride 104 98 - 111 mmol/L   CO2 29 22 - 32 mmol/L   Glucose, Bld 92 70 - 99 mg/dL    Comment: Glucose reference range applies only to samples taken after fasting for at least 8 hours.   BUN 10 6 - 20 mg/dL   Creatinine, Ser 7.25 0.44 - 1.00 mg/dL   Calcium 9.3 8.9 - 36.6 mg/dL   GFR, Estimated >44 >03 mL/min    Comment: (NOTE) Calculated using the CKD-EPI Creatinine Equation (2021)    Anion gap 6 5 - 15    Comment: Performed at [facility], [address], [place], [place] [address]   No results found.  ROS All pertinent positives and negatives are listed in HPI above  Blood pressure 127/78, pulse  98, temperature 98.6 F (37 C), temperature source Oral, resp. rate 18, height 5\' 9"  (1.753 m), weight 60 kg, SpO2 99 %. Physical Exam  Awake alert, oriented x3 Speech quiet, baseline PERRLA Intermittent disconjugate gaze Generalized chronic weakness, worse in her left upper extremity and bilateral lower extremities Right lower quadrant baclofen pump, oriented upside down.  Assessment/Plan 51 year old female with  Baclofen pump malfunction  -OR today for baclofen pump revision.  We discussed all risks, benefits and expected outcomes, she would like to proceed with surgical intervention.  Informed consent was obtained.  Thank you for allowing me to participate in this patient's care.  Please do not hesitate to call with questions or concerns.   45, DO Neurosurgeon [facility] Cell: [phone]

## 2022-01-11 NOTE — Anesthesia Procedure Notes (Signed)
Procedure Name: Intubation Date/Time: 01/11/2022 4:25 PM  Performed by: Eligha Bridegroom, CRNAPre-anesthesia Checklist: Patient identified, Emergency Drugs available, Suction available, Patient being monitored and Timeout performed Patient Re-evaluated:Patient Re-evaluated prior to induction Oxygen Delivery Method: Circle system utilized Preoxygenation: Pre-oxygenation with 100% oxygen Induction Type: IV induction Laryngoscope Size: Mac and 3 Grade View: Grade II Tube type: Oral Tube size: 7.0 mm Number of attempts: 1 Airway Equipment and Method: Stylet Placement Confirmation: ETT inserted through vocal cords under direct vision, positive ETCO2 and breath sounds checked- equal and bilateral Secured at: 21 cm Tube secured with: Tape Dental Injury: Teeth and Oropharynx as per pre-operative assessment

## 2022-01-11 NOTE — Op Note (Signed)
   Providing Compassionate, Quality Care - Together  Date of service: 01/11/2022   PREOP DIAGNOSIS:  Baclofen pump malfunction Multiple sclerosis with spasticity  POSTOP DIAGNOSIS: Same  PROCEDURE: Revision of abdominal portion of baclofen pump  SURGEON: Dr. Kendell Bane C. Kaislee Chao, DO  ASSISTANT: Docia Barrier, NP  ANESTHESIA: General Endotracheal  EBL: Minimal  SPECIMENS: None  DRAINS: None  COMPLICATIONS: None  CONDITION: Hemodynamically stable  HISTORY: Sharon Odom is a 51 y.o. female with a history of intrathecal baclofen pump for spasticity due to multiple sclerosis.  She had this placed in May 2023, unfortunately the pump flipped and could not be refilled at the abdominal component.  I tried in the office to mechanically manipulate the pump back into in its original position but was unsuccessful.  Therefore I recommended surgical revision.  We discussed all risks, benefits and expected outcomes, informed consent was obtained.  I answered all of her questions.  PROCEDURE IN DETAIL: The patient was brought to the operating room. After induction of general anesthesia, the patient was positioned on the operative table in the supine position. All pressure points were meticulously padded. Skin incision was then marked out and prepped and draped in the usual sterile fashion. Physician driven timeout was performed.  Local anesthetic was placed in the planned incision.  The previous incision was opened sharply with a 15 blade.  Then using Bovie electrocautery, I circumferentially dissected the pump in which there were multiple broken retaining sutures.  This was taken out of the pocket and reoriented appropriately.  I then evaluated the catheter which appeared continuous and intact.  I then placed four 2-0 Prolene sutures in each corner, and tied them down to the pump placed in the appropriate orientation.  The wound was noted to be excellently hemostatic.  I copiously irrigated the wound.   I then placed vancomycin powder in the wound.  I then closed the wound in layers, 2-0 Vicryl suture for the device layer.  I then closed the dermis with 2-0 and 3-0 Vicryl suture.  Skin was closed with skin glue, sterile dressing was applied.  At the end of the case all sponge, needle, and instrument counts were correct. The patient was then transferred to the stretcher, extubated, and taken to the post-anesthesia care unit in stable hemodynamic condition.

## 2022-01-11 NOTE — Anesthesia Preprocedure Evaluation (Addendum)
Anesthesia Evaluation  Patient identified by MRN, date of birth, ID band Patient awake    Reviewed: Allergy & Precautions, NPO status , Patient's Chart, lab work & pertinent test results, Unable to perform ROS - Chart review only  History of Anesthesia Complications Negative for: history of anesthetic complications  Airway Mallampati: II  TM Distance: >3 FB Neck ROM: Full    Dental  (+) Dental Advisory Given   Pulmonary shortness of breath, asthma , pneumonia, Current Smoker and Patient abstained from smoking.,    breath sounds clear to auscultation       Cardiovascular negative cardio ROS   Rhythm:Regular Rate:Normal     Neuro/Psych  Headaches, PSYCHIATRIC DISORDERS Anxiety Depression  Intrathecal baclofen pump Spastic hemiparesis   Neuromuscular disease (MS- wheelchair bound)    GI/Hepatic hiatal hernia, GERD  Medicated,  Endo/Other  negative endocrine ROS  Renal/GU negative Renal ROS Bladder dysfunction      Musculoskeletal  (+) Arthritis ,   Abdominal   Peds  Hematology negative hematology ROS (+)   Anesthesia Other Findings   Reproductive/Obstetrics                           Lab Results  Component Value Date   WBC 5.6 01/11/2022   HGB 12.7 01/11/2022   HCT 39.4 01/11/2022   MCV 98.3 01/11/2022   PLT 228 01/11/2022   Lab Results  Component Value Date   CREATININE 0.83 01/11/2022   BUN 10 01/11/2022   NA 139 01/11/2022   K 3.6 01/11/2022   CL 104 01/11/2022   CO2 29 01/11/2022    Anesthesia Physical  Anesthesia Plan  ASA: 3  Anesthesia Plan: General   Post-op Pain Management: Tylenol PO (pre-op)*   Induction: Intravenous  PONV Risk Score and Plan: 2 and Treatment may vary due to age or medical condition, Ondansetron, Midazolam and Dexamethasone  Airway Management Planned: Oral ETT  Additional Equipment: None  Intra-op Plan:   Post-operative Plan:  Extubation in OR  Informed Consent: I have reviewed the patients History and Physical, chart, labs and discussed the procedure including the risks, benefits and alternatives for the proposed anesthesia with the patient or authorized representative who has indicated his/her understanding and acceptance.     Dental advisory given  Plan Discussed with: CRNA  Anesthesia Plan Comments:        Anesthesia Quick Evaluation

## 2022-01-12 ENCOUNTER — Encounter (HOSPITAL_COMMUNITY): Payer: Self-pay | Admitting: Neurological Surgery

## 2022-01-12 NOTE — Anesthesia Postprocedure Evaluation (Signed)
Anesthesia Post Note  Patient: Sharon Odom  Procedure(s) Performed: Baclofen Pump revision     Patient location during evaluation: PACU Anesthesia Type: General Level of consciousness: awake and alert Pain management: pain level controlled Vital Signs Assessment: post-procedure vital signs reviewed and stable Respiratory status: spontaneous breathing, nonlabored ventilation and respiratory function stable Cardiovascular status: blood pressure returned to baseline Postop Assessment: no apparent nausea or vomiting Anesthetic complications: no   No notable events documented.  Last Vitals:  Vitals:   01/11/22 1800 01/11/22 1815  BP: 120/62 124/73  Pulse:  94  Resp:  10  Temp:  36.7 C  SpO2:  99%    Last Pain:  Vitals:   01/11/22 1815  TempSrc:   PainSc: 0-No pain                 Shanda Howells

## 2022-01-17 ENCOUNTER — Encounter: Payer: Self-pay | Admitting: Neurology

## 2022-01-17 ENCOUNTER — Ambulatory Visit (INDEPENDENT_AMBULATORY_CARE_PROVIDER_SITE_OTHER): Payer: BLUE CROSS/BLUE SHIELD | Admitting: Neurology

## 2022-01-17 VITALS — BP 113/72 | HR 80

## 2022-01-17 DIAGNOSIS — G8114 Spastic hemiplegia affecting left nondominant side: Secondary | ICD-10-CM

## 2022-01-17 MED ORDER — ONABOTULINUMTOXINA 100 UNITS IJ SOLR: 100.0000 [IU] | Freq: Once | INTRAMUSCULAR | Status: DC

## 2022-01-17 NOTE — Progress Notes (Unsigned)
Botox 100 units x 1 vial today [id number] [phone] Exp-2026/02 SP Pt has 300 units ordered, but today we only used 100 units, 2 vials left in stock.

## 2022-01-17 NOTE — Telephone Encounter (Signed)
I called [facility] and spoke with Raymon Mutton  He confirmed authorization is on file for this PA effective from 06/21/2021 from 06/20/2022 Ref # [id number].  Pt has to give consent for drug to be shipped. [facility] has called three times to do this and vm's left but pt has not called back.  I have sent a my chart message to the pt and provided number she needs to call to expedite shipment.

## 2022-01-17 NOTE — Progress Notes (Signed)
PATIENT: Sharon Odom DOB: 12-Mar-1971  Chief Complaint  Patient presents with   Procedure    Room 14,  States today she want botox only in hand      HISTORICAL   Sharon Odom is a 51 years old female, accompanied by her husband, referred by Dr. Epimenio Foot for evaluation of botulism toxin injection for spasticity due to multiple sclerosis, this is the first time I evaluated her, also performed injection today on April 18, 2018.  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  She received Lemtrada infusion in April 2017, second dose April 2018, due to continued mild progression of her symptoms, she complete her third Lemtrada infusion in October 2019.  She has been complaining of bilateral lower extremity, left upper extremity spasticity since late 2017, gradually getting worse, has tried different medications, including baclofen up to 120 mg daily, with limited benefit, she is not taking baclofen 80 mg, previously also tried and failed tizanidine, Trileptal, lower dose of botulism toxin a injection in the past only mild temporarily improvement.  She did not ambulate with walker at home, wheelchair for longer distance, complains painful frequent left lower extremity spasm, forceful left lower extremity extension, when she bear weight, she has left toes forceful painful flexion grabbing on the floor, also left shoulder, arm spasm, difficulty release left hand grip.  She was recently started on Valium 5 mg every 6 hours, which  has been helpful.  I personally reviewed the MRI of the brain and cervical spine in September 2019, the MRI of the brain shows multiple T2/FLAIR hyperintense foci in brainstem, cerebellum, left thalamus, hemisphere, consistent with typical chronic demyelinating plaque, no enhancement. MRI of cervical spine showed small T2/flair hyperintensity foci posterior to the left C3-4, several 45 also noted within the brainstem, multilevel mild degenerative changes.  No significant canal stenosis.   She also complains of urinary urgency, frequency, is taking Myrbetriq 50 mg, Wellbutrin 300 mg for depression, mild swallowing difficulty, swallowing evaluation is pending  UPDATE August 06 2018: She had first injection on April 18, 2018, we used Botox a 600 units, 400 units into spastic left lower extremity, 200 into left upper extremity,  Within 3 days, she notice weakness of left 1, 2 fingers, lasted for few weeks, she has difficulty using her left hand, but her left bicep pains has much improved,it also helped her left calf muscle spams.  She noticed increased left leg and shoulder muscle spasm since Jan 10.  UPDATE November 05 2018: She responded well to previous injection, reported 80% improvement of her left thigh muscle spasm, 50% improvement of her left shoulder, proximal arm muscle spasm, but the benefit of short lasting, she continue have bilateral toe flexion, painful, left worse than right, there was no improvement in her left calf muscle spasm, and pain.  UPDATE Sept 14 2020: She responded well to previous injection to left shoulder, but recently she complains of worsening left shoulder pain, is planning on to have evaluation and x-ray by her primary care physician, she denies significant improvement  to the spasticity of her left lower extremity, was recently evaluated by Dr. Maryjean Ka, planning on to have baclofen placement, per patient, low-dose baclofen intrathecal trial produced miracles,  UPDATE  May 14 2019: She reported significant improvement with botulism toxin injection to her left shoulder, mild improvement to spasticity left lower extremity  UPDATE August 17 2019: She responded well to previous injection, left shoulder, left calf muscle spasm has much improved, she received baclofen pump by Dr. Maryjean Ka in September 2020, is going through dose adjustment,  Update November 25, 2019: Baclofen pump that was placed by Dr. Maryjean Ka in September 2020 has been helpful, especially for her lower extremity, today she just want Botox injection for her spastic left upper extremity, complains of left shoulder pain, left hand finger flexion  UPDATE Jul 12 2020: I personally reviewed MRI of the brain with without contrast in November 2021: Multiple T2/FLAIR hyperintensity foci in the brainstem, cerebellum, thalamus, hemisphere, consistent with chronic demyelinating plaque, no new lesions, no contrast-enhancement MRI of cervical spine with without contrast, hazy upper cervical cord, multiple T2 hyperintensity foci within the spinal cord at cervical medullary junction, C2-3, C3-4, C4 and C6, no contrast-enhancement, no change compared to previous scan November 2020  Also reviewed previous evaluation by Dr. Felecia Shelling, [facility] admission for delusion and hallucinations, baclofen, has really helped her spasticity, occasionally tonic spasms can be severe, more frequent tonic spasming feet, mildly increased tone in legs, difficulty lifting left leg,  She woke up October 6 noticed left wrist drop, left arm pain, and hand numbness, at baseline her left arm is weaker than right,  This has led to EMG nerve conduction study on April 19, 2020, showed normal left radial, median, ulnar sensory response.  But there is evidence of spontaneous activity at left extensor digitorum communis, extensor index proprius, wrist the possibility of left radial neuropathy,  She is here to evaluate potential Botox  injection for her reported left shoulder tightness,  UPDATE Jul 13 2021: She stopped the Botox injection for left shoulder tightness for a while, reported to improvement with baclofen pump, unfortunately she suffered infection around the pump, has to be taking out, now noticed increased left shoulder tightness, and also left lower extremity spasticity, left knee extension,  Return for Botox injection for spastic left upper and lower extremity  UPDATE Oct 12 2021: She is accompanied by her husband at today's visit, complains of worsening bilateral lower extremity spasticity, she had a baclofen pump, malfunction, have to redo, is on the titration dose of baclofen, but is much lower dose than previous, now she complains of increased bilateral lower extremity spasticity, especially left leg, sometimes difficulty bending her left knee,  Last injection to left upper extremity 3 months ago has been helpful,  UPDate January 17, 2022: She is brought in by her neighbor at today's clinical visit, continue have difficulty with her baclofen pump, was only able to use a low dosage, higher dosage because significant lower extremity weakness, left more than right upper and lower extremity weakness, spasticity  I will only use 100 units of Botox for spastic left upper extremity especially left fingers today   PHYSICAL EXAM   Vitals:   01/17/22 1405  BP: 113/72  Pulse: 80    There is no height or weight on file to calculate BMI.   Drowsy, in mild distress, slurred speech, oriented to history taking and casual conversation.  MOTOR: right upper extremity motor strength is relatively preserved, she is able to raise left  arm against gravity, no significant spasticity, but have a tendency for crawling of left fingers especially left lateral 2 fingers,  Antigravity movement of bilateral lower extremity, weaker on the left side,   ASSESSMENT AND PLAN  Sharon Odom is a 51 y.o. female    Relapsing  Remitting Multiple Sclerosis, left spasticity hemiparesis  Status post baclofen pump placement by Dr. Norville Haggard in September 2020, which has helped her lower extremity spasticity, unfortunately she has suffered a pump infection, has to be taken out in 2022, now she had new baclofen pump, is on titration dose of intrathecal baclofen, complains of increased left upper and lower extremity spasticity  She already has mild weakness on the left lower extremity, described buckling of her left knee sometimes,  Will only do low-dose 100 units Botox injection for left finger spasticity  Under electric stimulation, used Botox 100 units (dissolved into 2 cc of normal saline)  Left pronator teres 25 units Left palmaris longus 12.5 units Left flexor digitorum profundus 25 units Left flexor digitorum superficialis 25 units Left flexor ulnaris 12.5 units    Levert Feinstein, M.D. Ph.D.  [facility] [address] [place], [place] [address] Ph: [phone] Fax: [phone]  CC: Referring Provider

## 2022-01-18 MED ORDER — ONABOTULINUMTOXINA 100 UNITS IJ SOLR: 100.0000 [IU] | Freq: Once | INTRAMUSCULAR | Status: DC

## 2022-01-22 ENCOUNTER — Encounter: Payer: Self-pay | Admitting: Neurology

## 2022-01-22 ENCOUNTER — Ambulatory Visit (INDEPENDENT_AMBULATORY_CARE_PROVIDER_SITE_OTHER): Payer: BLUE CROSS/BLUE SHIELD | Admitting: Neurology

## 2022-01-22 VITALS — BP 131/82 | HR 91 | Ht 69.0 in

## 2022-01-22 DIAGNOSIS — G8114 Spastic hemiplegia affecting left nondominant side: Secondary | ICD-10-CM | POA: Diagnosis not present

## 2022-01-22 DIAGNOSIS — R269 Unspecified abnormalities of gait and mobility: Secondary | ICD-10-CM | POA: Diagnosis not present

## 2022-01-22 DIAGNOSIS — G35 Multiple sclerosis: Secondary | ICD-10-CM

## 2022-01-22 DIAGNOSIS — R252 Cramp and spasm: Secondary | ICD-10-CM

## 2022-01-22 DIAGNOSIS — Z79899 Other long term (current) drug therapy: Secondary | ICD-10-CM

## 2022-01-22 DIAGNOSIS — R208 Other disturbances of skin sensation: Secondary | ICD-10-CM

## 2022-01-22 MED ORDER — LAMOTRIGINE 25 MG PO TABS: ORAL_TABLET | ORAL | 5 refills | Status: DC

## 2022-01-22 NOTE — Progress Notes (Signed)
[facility]  PATIENT: Sharon Odom DOB: Jul 31, 1970  REFERRING DOCTOR OR PCP:  Claretta Fraise SOURCE: Patient, notes imaging and lab reports, MRI images on CD from neurology,  _________________________________   HISTORICAL   CHIEF COMPLAINT:  Chief Complaint  Patient presents with   Follow-up    RM 1. In [facility] in office today. Complains of increased pain legs, left arm/fingers.     HISTORY OF PRESENT ILLNESS: Sharon Odom is a 51 y.o. woman with active/relapsing secondary progressive multiple sclerosis, left greater than right spasticity and poor gait.  Update 01/22/2022 She is noting more pain and spasticity in both legs and the right arm.  She also feels the ankle edema (left worse than right) is doing worse.      She had a new baclofen pump placed Sep 28, 2021.   At the time pump was refilled with 2000 mcg/ml baclofen and set to 99.99 mcg/day.  She had pins and needles in the right leg after the procedure   The baclofen dose was  increased several times in June.  After the second week in June she had more pain and the left leg would buckle more.      She is currently at 374 mcg baclofen daily (Intrathecal) and 60 mg po baclofen over the.    Her peak intrathecal baclofen dose was 869 mcg/day a couple years ago.  She had an abdominal pump revision 01/11/2022 due to a flipped pump and feels slightly stronger sincee. -- however this is concurrent with reducing the oral baclofen dose  Her last [name] was January 2021 (fourth course). She is compliant with the REMS program.  She has no bruising or evidence of ITP.  No evidence of renal insufficiency..  She has occasional UTIs.    No recent exacerbation.     She reports more dysarthria.    She has had a swallow study and consistency of her diet was changed.  She is doing better with nectar consistency and will be advancing thickness further.      She was on antibiotics for a mild aspiration pneumonia.   She is following  up for a chest x-ray.  She has had a lot of abdominal bloating and was found to be constipated despite Miralax, fiber and Senokot.   She will be having a colonoscopy.     She can take some steps with a walker but not as many as last year.     She woke up March 02, 2020 with a left wrist drop and left arm pain and hand numbness.   NCV/EMG confirmed a radial palsy.   She spontaneously improved over 8-10 weeks.     She had some delusions while on Prialt but these have no recurred.    She still feels cognition did not return completely to baseline   Impression 04/19/2020: This NCV/EMG study shows the following: 1.   Severe subacute left radial neuropathy.  Voluntary motor units were noted in the EDC muscle. 2.   No evidence of significant radiculopathy.  MS history: She was diagnosed in October 2013 due to difficulty with her handwriting and right hand coordination.   A few weeks later she had gait ataxia and numbness and was referred to MS.   She had an MRI of the brain (was in [place] at the time) and then had a LP confirming the diagnosis.  Initially, she was placed on Copaxone but had severe frequent exacerbations and then went to Tecfidera due to breakthrough  exacerbations.  She was referred to [facility] for Tysabri (Feb 2014 to November 2016).  She then moved to [facility] and saw Dr. Ilene Qua [facility] [phone]).   She was always JCV Ab titer positive.    She did well on it but stopped 03/2015 due to safety risk.  She had more spasticity early 2017 that improved with 5 days IV Solumedrol and then started [facility] April 2017.   The infusion went well.    She started having more spasms in her legs later in 2017 and had Botox injections into her feet (some benefit but only for 3-4 weeks).   She had a second course of Botox November 2017 without much benefit.     She had numbness and tingling in her left arm November 2017 and was given an ESI.   Medical MJ did not help.   She had her second course  of Lemtrada in April 2018 but the steroid did not help her spasticity as it did the first year.    She saw Dr. Gaynell Face at [facility] a couple times in 2018 and also saw Dr. Aquilla Hacker at [facility]. I started to see her in 2019.  A third course of Lemtrada was infused in October 2019.  A 4th course was infused January 2021.  Due to spasticity, she had Botox injections, initially with benefit. However, due to continued symptoms, a baclofen pump placed September 2020. She is followed at [facility] (Dr. Juanetta Gosling)  Imaging:  MRI of the brain 03/19/2017 shows multiple T2/FLAIR hyperintense foci in the cerebellum, pons, left greater than right thalamus and in the periventricular, juxtacortical and deep white matter of both hemispheres.      MRI of the brain 03/31/2019 was felt to be slightly progressed compared to her previous MRI in 2019.  However, by my interpretation the MRI is stable.  It shows T2/FLAIR hyperintense foci in the hemispheres, thalamus, pons and cerebellum consistent with MS  MRI of the cervical and thoracic spine 03/31/2019 showed a discrete lesion posteriorly to the left at C3-C4 and subtle patchy multifocal cord signal abnormalities in the cervical spine.  By my interpretation the MRI is stable compared to 2019.  The thoracic spinal cord was normal.  REVIEW OF SYSTEMS: Constitutional: No fevers, chills, sweats, or change in appetite.  She reports fatigue. Eyes: As above Ear, nose and throat: No hearing loss, ear pain, nasal congestion, sore throat Cardiovascular: No chest pain, palpitations Respiratory:  No shortness of breath at rest or with exertion.   No wheezes GastrointestinaI: No nausea, vomiting, diarrhea, abdominal pain, fecal incontinence Genitourinary: As above  musculoskeletal: She has pain in her legs, left greater than right due to spasticity.  She notes mild back pain. Integumentary: No rash, pruritus, skin lesions Neurological: as  above Psychiatric: As above Endocrine: No palpitations, diaphoresis, change in appetite, change in weigh or increased thirst Hematologic/Lymphatic:  No anemia, purpura, petechiae. Allergic/Immunologic: No itchy/runny eyes, nasal congestion, recent allergic reactions, rashes  ALLERGIES: Allergies  Allergen Reactions   Ziconotide Acetate Shortness Of Breath    Anorexia, weird sensations, could not talk, choking feeling, lost since of taste and smell. Hallucinations, vivid dreams. Confusion and sleepiness. N/v, increase in pain.  Anorexia, weird sensations, could not talk, choking feeling, lost since of taste and smell. Hallucinations, vivid dreams. Confusion and sleepiness. N/v, increase in pain.    Morphine Other (See Comments)    Pt does not recall reaction    Amoxicillin Nausea And Vomiting   Lyrica [Pregabalin]  Nausea And Vomiting    HOME MEDICATIONS:  Current Outpatient Medications:    acetaminophen (TYLENOL) 500 MG tablet, Take 1,000 mg by mouth every 6 (six) hours as needed for moderate pain., Disp: , Rfl:    albuterol (VENTOLIN HFA) 108 (90 Base) MCG/ACT inhaler, Inhale 1-2 puffs into the lungs every 6 (six) hours as needed for wheezing or shortness of breath., Disp: 6.7 g, Rfl: 1   Ascorbic Acid (VITAMIN C) 1000 MG tablet, Take 1,000 mg by mouth daily., Disp: , Rfl:    baclofen (LIORESAL) 20 MG tablet, Take 2 tablets (40 mg total) by mouth 4 (four) times daily. Since ITB pump out (Patient taking differently: Take 20 mg by mouth 3 (three) times daily. Since ITB pump out), Disp: 720 tablet, Rfl: 3   buPROPion (WELLBUTRIN XL) 300 MG 24 hr tablet, TAKE 1 TABLET DAILY (Patient taking differently: Take 300 mg by mouth daily.), Disp: 90 tablet, Rfl: 3   carboxymethylcellulose (REFRESH PLUS) 0.5 % SOLN, Place 1 drop into both eyes 3 (three) times daily as needed (dry eyes)., Disp: , Rfl:    Cholecalciferol (VITAMIN D-3) 125 MCG (5000 UT) TABS, Take 5,000 Units by mouth daily., Disp: 30  tablet, Rfl: 1   conjugated estrogens (PREMARIN) vaginal cream, Place 1 Application vaginally 3 (three) times a week. Blueberry size amount for application, Disp: , Rfl:    Cyanocobalamin (VITAMIN B-12) 2500 MCG SUBL, Place 1 tablet (2,500 mcg total) under the tongue daily., Disp: 30 tablet, Rfl: 0   dalfampridine 10 MG TB12, Take 1 tablet (10 mg total) by mouth 2 (two) times daily. One po bid, Disp: 180 tablet, Rfl: 3   diazepam (VALIUM) 5 MG tablet, Take 1.5 tablets (7.5 mg total) by mouth every 8 (eight) hours., Disp: 140 tablet, Rfl: 5   furosemide (LASIX) 40 MG tablet, Take 40 mg by mouth daily., Disp: , Rfl:    montelukast (SINGULAIR) 10 MG tablet, TAKE 1 TABLET DAILY (Patient taking differently: Take 10 mg by mouth daily after breakfast.), Disp: 90 tablet, Rfl: 3   Oxycodone HCl 10 MG TABS, Take 1 tablet (10 mg total) by mouth every 4 (four) hours as needed., Disp: 120 tablet, Rfl: 0   oxymetazoline (AFRIN) 0.05 % nasal spray, Place 1 spray into both nostrils 2 (two) times daily as needed for congestion., Disp: , Rfl:    polyethylene glycol (MIRALAX / GLYCOLAX) 17 g packet, Take 17 g by mouth daily. Takes BID sometimes, Disp: , Rfl:    Psyllium (METAMUCIL PO), Take 1 Dose by mouth daily., Disp: , Rfl:    senna-docusate (SENOKOT-S) 8.6-50 MG tablet, Take 1 tablet by mouth daily. (Patient taking differently: Take 2 tablets by mouth daily.), Disp: 30 tablet, Rfl: 0   tiZANidine (ZANAFLEX) 4 MG tablet, TAKE 2 TABLETS THREE TIMES A DAY (Patient taking differently: Take 8 mg by mouth 3 (three) times daily.), Disp: 720 tablet, Rfl: 3   trospium (SANCTURA) 20 MG tablet, Take 20 mg by mouth 2 (two) times daily., Disp: , Rfl:   PAST MEDICAL HISTORY: Past Medical History:  Diagnosis Date   Allergies    Anxiety    Arthritis    Asthma    seasonal - pollen   Constipation    Depression    Dyspnea    with exertion   GERD (gastroesophageal reflux disease)    Headache    History of hiatal hernia     Left radial nerve palsy 03/15/2020   resolved - Left arm/hand weak  Multiple sclerosis ([place])    Pneumonia 2007   x 1   Vision abnormalities    Wears glasses     PAST SURGICAL HISTORY:   FAMILY HISTORY: Family History  Problem Relation Age of Onset   Diabetes Mother    Healthy Brother     SOCIAL HISTORY:  Social History   Socioeconomic History   Marital status: Married    Spouse name: Shanon Brow   Number of children: Not on file   Years of education: Not on file   Highest education level: Not on file  Occupational History   Not on file  Tobacco Use   Smoking status: Every Day    Packs/day: 0.50    Years: 32.00    Total pack years: 16.00    Types: Cigarettes    Start date: 1988   Smokeless tobacco: Never  Vaping Use   Vaping Use: Former   Quit date: 05/29/2011  Substance and Sexual Activity   Alcohol use: Not Currently    Comment: 2-3 times per wk   Drug use: Never   Sexual activity: Not Currently    Birth control/protection: Post-menopausal  Other Topics Concern   Not on file  Social History Narrative   Right handed    Caffeine: 1 cup or less per day- coffee   [employer]   Social Determinants of Health   Financial Resource Strain: Not on file  Food Insecurity: No Food Insecurity (04/03/2019)   Hunger Vital Sign    Worried About Running Out of Food in the Last Year: Never true    Ran Out of Food in the Last Year: Never true  Transportation Needs: No Transportation Needs (04/03/2019)   PRAPARE - Hydrologist (Medical): No    Lack of Transportation (Non-Medical): No  Physical Activity: Inactive (04/03/2019)   Exercise Vital Sign    Days of Exercise per Week: 0 days    Minutes of Exercise per Session: 0 min  Stress: No Stress Concern Present (04/03/2019)   [place]    Feeling of Stress : Not at all  Social Connections: Not on file  Intimate Partner Violence: Not  At Risk (04/03/2019)   Humiliation, Afraid, Rape, and Kick questionnaire    Fear of Current or Ex-Partner: No    Emotionally Abused: No    Physically Abused: No    Sexually Abused: No     PHYSICAL EXAM  Vitals:   01/22/22 1256  BP: 131/82  Pulse: 91  SpO2: 99%  Height: 5\' 9"  (1.753 m)    Body mass index is 19.53 kg/m.   General: The patient is well-developed and well-nourished and in no acute distress  Neurologic Exam  Mental status: The patient is alert and oriented x 3 at the time of the examination. The patient has apparent normal recent and remote memory, with an apparently normal attention span and concentration ability.   Speech is normal.  Cranial nerves: Bilateral INO's, right greater than left.  Facial strength and sensation appear normal.  She has dysarthria..  The tongue is midline, and the patient has symmetric elevation of the soft palate. No obvious hearing deficits are noted.  Motor:   Spasticity is noted in legs R>L (was L>R).  Strength is 2+-3/5 in left wrist extension and triceps and hands and 3-4/5 in other muscles  The right arm has normal muscle tone and it was slightly increased proximally in the left arm.  Strength is 2+ to 3/5 in the left leg and 4- to 4/5 in the right leg .     Sensory: Normal sensation to touch and vibration in the arms.  She has reduced vibration sensation in the left leg..  Coordination: She has good right finger-nose-finger but reduced left finger-nose-finger.  HTS is very poor on the right and she can't do on the left.    Gait and station: With a walker, she can take several steps though has difficulty with  Reflexes: Deep tendon reflexes are now 1+ at the knees.  No clonus at the ankles     ASSESSMENT AND PLAN     1. Relapsing remitting multiple sclerosis (HCC)   2. Spastic hemiparesis of left nondominant side due to non-cerebrovascular etiology (HCC)   3. Gait disturbance   4. Dysesthesia   5. Spasticity   6. High  risk medication use        1.  Continue Lemtrada REMS lab work until 2025.  She has had 4 courses of Lemtrada for her MS last one in 2021.     2.  Spasticity: She has a baclofen pump and this is being managed by Dr. Dagoberto Ligas at [facility] .  Although she reports that spasticity is worse, she actually seems to have fairly low muscle tone in the left leg.  However, muscle tone is increased in the right leg.  She seems to have had more difficulty with her gait as the dose was increased and has done better on a lower dose.    If control her spasticity is difficult without leading to more weakness and difficult gait, she might benefit from a change in the pump drug from baclofen to a combination of baclofen and clonidine.   3.   We will continue Ampyra.     She will continue baclofen and tizanidine pills.   4.   Lamotrigine titrate to 50 mg po bid as adjuvant for pain. 5.   Follow-up in 6 months or sooner if there are new or worsening neurologic symptoms.   50 minute office visit with the majority of the time spent face-to-face for history and physical, discussion/counseling and decision-making.  Additional time with record review and documentation.  Nimesh Riolo A. Felecia Shelling, MD, [facility] [id number], [id number] PM Certified in Neurology, Clinical Neurophysiology, Sleep Medicine, Pain Medicine and Neuroimaging  [facility] [address], [place], [place] [address] [phone]

## 2022-01-25 ENCOUNTER — Other Ambulatory Visit: Payer: Self-pay | Admitting: Physical Medicine and Rehabilitation

## 2022-01-25 ENCOUNTER — Telehealth: Payer: Self-pay | Admitting: Neurology

## 2022-01-25 NOTE — Telephone Encounter (Signed)
Ashanti @ [facility] has called ot report :Drug Interaction between lamoTRIgine (LAMICTAL) 25 MG tablet and  conjugated estrogens (PREMARIN) vaginal cream.  Please contact pt if she needs to stop one or the other.

## 2022-01-26 ENCOUNTER — Encounter: Payer: Self-pay | Admitting: Physical Medicine and Rehabilitation

## 2022-01-26 MED ORDER — OXYCODONE HCL 10 MG PO TABS: 10.0000 mg | ORAL_TABLET | ORAL | 0 refills | Status: DC | PRN

## 2022-02-06 ENCOUNTER — Encounter: Payer: Self-pay | Admitting: Neurology

## 2022-02-09 ENCOUNTER — Encounter: Payer: Self-pay | Admitting: Physical Medicine and Rehabilitation

## 2022-02-09 ENCOUNTER — Encounter
Payer: BLUE CROSS/BLUE SHIELD | Attending: Physical Medicine and Rehabilitation | Admitting: Physical Medicine and Rehabilitation

## 2022-02-09 VITALS — BP 119/83 | HR 91 | Ht 69.0 in

## 2022-02-09 DIAGNOSIS — G894 Chronic pain syndrome: Secondary | ICD-10-CM | POA: Diagnosis present

## 2022-02-09 DIAGNOSIS — Z5181 Encounter for therapeutic drug level monitoring: Secondary | ICD-10-CM | POA: Insufficient documentation

## 2022-02-09 DIAGNOSIS — Z79899 Other long term (current) drug therapy: Secondary | ICD-10-CM | POA: Insufficient documentation

## 2022-02-09 MED ORDER — DANTROLENE SODIUM 50 MG PO CAPS: 50.0000 mg | ORAL_CAPSULE | Freq: Every day | ORAL | 5 refills | Status: DC

## 2022-02-09 NOTE — Patient Instructions (Signed)
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 Hoarseness AND dysarthria; not just 1 issue. Insomnia and ITB refills  ITB pump removed due to pump infection 11/10 by Dr Jake Samples- here for f/u on spasticity and progressive MS.  At 372.4 mcg per day- 10% reduction, which pt wants to try- would bring dose down to 334.5 mcg/day-  - New refill date before 06/03/22. But might need further titration, so will see, before we schedule that with [facility].  Going down on pump, because per pt, Dr Epimenio Foot feels like strength not getting weaker - if we go down on pump, and cannot walk better, then I think we have to say, that tone isn't the issue- that the issue is worsening weakness.   2. Pt wants to increase Oxycodone- will increase frequency to 6x- day. It's not due to be refilled but will be refilled earlier at higher frequency-   3. For NERVE PAIN- On Lamictal- for 4-6 weeks- needs to give another 2 weeks and if fails, then we can try Keppra/Levicetracem  4. Dantrolene- Side effects- looser stools, sedation,  Start 50 mg nightly x 1 week Then 50 mg 2x/day x 1 week Then 50 mg 3x/day x 1 week Then 100 mg 2x/day - until dose changed  Needs CMP/LFTs/labs checked in 1 month, 3 months and every 6 months while on medication Needs liver function tests at next appointment-    5. Advised that I think pain is nerve pain- I'm not sure increase in Oxy will help- but we will try.    6. F/U in 65month- double appt- MS  7. Caregiver fatigue-  needs some help at home- for shower, etc.   8.  Think pt would be a great candidate- for inpatient rehab-  pt is failing at home at this time, functionally and need ability to fine tune ITB pump- cannot do this in visits to outpatient clinic- she also needs intensive rehab to improve her level of function.

## 2022-02-09 NOTE — Progress Notes (Signed)
Subjective:    Patient ID: Sharon Odom, female    DOB: [birth year], 51 y.o.   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 Hoarseness AND dysarthria; not just 1 issue. Insomnia and ITB refills  ITB pump removed due to pump infection 11/10 by Dr Jake Samples- here for f/u on spasticity and progressive MS.  Had pump revision- in August-01/11/22 because it had flipped- doing better- no issues that she knows of.  Had 3 week f/u with Dr Dawley last week.  No issues per pt/Dr Dawley.  Last had Boto in hands 8/23   Takes 5 steps max - hunches over - cannot really move RLE is dragging- husband has to move it.    Drinking regular liquids now- per pt- has stopped nectar thick liquids- takes pills in applesauce.    Has seen Dr Wilson Singer- he did Botox of bladder- and has worked well - helps leakage dramatically.  Still has foley- with leg bag- it's a blessing since cannot get to toilet on her own.   Wants to try and reduce baclofen dose.    Was put on Lamictal for nerve pain  Thins oxycodone isn't working anymore-  From mid thigh down, something chewing away at her- at times, heels and bottom of feet burn-   But sitting here right now- feels like someone eating her from inside out. Someone pulling stuff out of her. Or attacked by something. And throbbing. Constant vibration- like when arm/leg goes to sleep- but painful-   Is also cold all the time.   Tried: Lyrica and Gabapentin hasn't worked Has been on cymbalta - thought caused her to gain weight- wasn't on very long.   Feels like someone sawing a blade though her arm pit. And legs run over.   Doesn't know what they gave her for surgery- but the anesthesia, but was able to move better than has in months.   Still On valium 7.5 mg TID Zanaflex- still taking 8 mg TID.  Baclofen 20 mg TID- when has spasms, takes 1 more Hasn't tried dantrolene-  Doesn't have  liver issues.    Pain Inventory Average Pain 8 Pain Right Now 9 My pain is sharp, burning, tingling, and aching  LOCATION OF PAIN  shoulder, elbow, wrist, hand, fingers, knee, leg, ankle, toes, forearm, bicep  BOWEL Number of stools per week: 3-4 Oral laxative use Yes  Type of laxative miralax, metamucil, senekot   BLADDER Foley    Mobility use a walker how many minutes can you walk? Less than 1 minute ability to climb steps?  no do you drive?  no use a wheelchair needs help with transfers transfers alone  Function disabled: date disabled 02/2012 I need assistance with the following:  dressing, bathing, meal prep, household duties, and shopping  Neuro/Psych weakness numbness tremor tingling trouble walking spasms depression  Prior Studies Any changes since last visit?  no  Physicians involved in your care Any changes since last visit?  no   Family History  Problem Relation Age of Onset   Diabetes Mother    Healthy Brother    Social History   Socioeconomic History   Marital status: Married    Spouse name: Onalee Hua   Number of children: Not on file   Years of education: Not on file   Highest education level: Not on file  Occupational History   Not on file  Tobacco Use  Smoking status: Every Day    Packs/day: 0.50    Years: 32.00    Total pack years: 16.00    Types: Cigarettes    Start date: 1988   Smokeless tobacco: Never  Vaping Use   Vaping Use: Former   Quit date: 05/29/2011  Substance and Sexual Activity   Alcohol use: Not Currently    Comment: 2-3 times per wk   Drug use: Never   Sexual activity: Not Currently    Birth control/protection: Post-menopausal  Other Topics Concern   Not on file  Social History Narrative   Right handed    Caffeine: 1 cup or less per day- coffee   [employer]   Social Determinants of Health   Financial Resource Strain: Not on file  Food Insecurity: No Food Insecurity (04/03/2019)   Hunger Vital Sign     Worried About Running Out of Food in the Last Year: Never true    Ran Out of Food in the Last Year: Never true  Transportation Needs: No Transportation Needs (04/03/2019)   PRAPARE - Administrator, Civil Service (Medical): No    Lack of Transportation (Non-Medical): No  Physical Activity: Inactive (04/03/2019)   Exercise Vital Sign    Days of Exercise per Week: 0 days    Minutes of Exercise per Session: 0 min  Stress: No Stress Concern Present (04/03/2019)   [employer] of Occupational Health - Occupational Stress Questionnaire    Feeling of Stress : Not at all  Social Connections: Not on file   Past Surgical History:  Procedure Laterality Date   COLONOSCOPY     EXCISION MORTON'S NEUROMA Right    fallopian tube removal     INTRATHECAL PUMP IMPLANT Right 02/18/2019   Procedure: INTRATHECAL PUMP IMPLANT;  Surgeon: Odette Fraction, MD;  Location[facility] OR;  Service: Neurosurgery;  Laterality: Right;  INTRATHECAL PUMP IMPLANT   INTRATHECAL PUMP IMPLANT Right 02/17/2021   Procedure: Baclofen Pump Replacement;  Surgeon: Maeola Harman, MD;  Location: [facility] OR;  Service: Neurosurgery;  Laterality: Right;  3C/RM 21   INTRATHECAL PUMP REVISION N/A 01/11/2022   Procedure: Baclofen Pump revision;  Surgeon: Dawley, Alan Mulder, DO;  Location: [facility] OR;  Service: Neurosurgery;  Laterality: N/A;  RM 21 to follow   PAIN PUMP IMPLANTATION N/A 09/28/2021   Procedure: Insertion of baclofen pump, right lower quadrant;  Surgeon: Dawley, Alan Mulder, DO;  Location: [facility] OR;  Service: Neurosurgery;  Laterality: N/A;   PAIN PUMP REMOVAL Left 04/06/2021   Procedure: Removal of baclofen pump, Left;  Surgeon: Dawley, Alan Mulder, DO;  Location: [facility] OR;  Service: Neurosurgery;  Laterality: Left;  Lateral/Left//3C rm 19   UPPER GI ENDOSCOPY     WISDOM TOOTH EXTRACTION     WISDOM TOOTH EXTRACTION     Past Medical History:  Diagnosis Date   Allergies    Anxiety    Arthritis    Asthma    seasonal - pollen   Constipation     Depression    Dyspnea    with exertion   GERD (gastroesophageal reflux disease)    Headache    History of hiatal hernia    Left radial nerve palsy 03/15/2020   resolved - Left arm/hand weak   Multiple sclerosis (HCC)    Pneumonia 2007   x 1   Vision abnormalities    Wears glasses    BP 119/83   Pulse 91   Ht 5\' 9"  (1.753 m)   SpO2 96%  BMI 19.53 kg/m   Opioid Risk Score:   Fall Risk Score:  `1  Depression screen [facility] 2/9     02/09/2022    3:27 PM 12/29/2021    2:23 PM 09/22/2021    1:48 PM 06/16/2021    1:43 PM 04/17/2021    2:04 PM 12/21/2020   12:07 PM 08/08/2020    3:30 PM  Depression screen PHQ 2/9  Decreased Interest 3 3 1  0 0 0 1  Down, Depressed, Hopeless 3 3 1  0 0 0 1  PHQ - 2 Score 6 6 2  0 0 0 2     Review of Systems  Constitutional:  Positive for chills.  HENT: Negative.    Eyes: Negative.   Respiratory: Negative.    Cardiovascular:  Positive for leg swelling.  Gastrointestinal:  Positive for constipation.  Endocrine: Negative.   Genitourinary: Negative.   Musculoskeletal:  Positive for gait problem.  Skin: Negative.   Allergic/Immunologic: Negative.   Neurological:  Positive for tremors, weakness and numbness.  Hematological: Negative.   Psychiatric/Behavioral:  Positive for dysphoric mood.       Objective:   Physical Exam Tangential- hard to stay on specific topic- changes between her specific topics.  At rest, awake, alert, but leaning dramatically to left, which is much worse than last visit; in manual w/c; accompanied by husband, NAD RUE 5-/5 LUE- biceps 4-/5; triceps 4-/5; WE 3+/5; grip 3-/5; and FA 2/5 RLE-HF 3+/5; KE 3+/5; DF and PF 3+/5 LLE- LLE basically at best 1/5 but only a muscle twitch seen/felt-  Neuro:  MAS of 4 in R hip, knee and ankle  LLE- MAS of 2-3 in LLE  Sounds gurgly with speech.     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  Hoarseness AND dysarthria; not just 1 issue. Insomnia and ITB refills  ITB pump removed due to pump infection 11/10 by Dr - here for f/u on spasticity and progressive MS.  At 372.4 mcg per day- 10% reduction, which pt wants to try- would bring dose down to 334.5 mcg/day-  - New refill date before 06/03/22. But might need further titration, so will see, before we schedule that with [facility].  Going down on pump, because per pt, Dr 13/10 feels like strength not getting weaker - if we go down on pump, and cannot walk better, then I think we have to say, that tone isn't the issue- that the issue is worsening weakness.   2. Pt wants to increase Oxycodone- will increase frequency to 6x- day. It's not due to be refilled but will be refilled earlier at higher frequency-   3. For NERVE PAIN- On Lamictal- for 4-6 weeks- needs to give another 2 weeks and if fails, then we can try Keppra/Levicetracem  4. Dantrolene- Side effects- looser stools, sedation,  Start 50 mg nightly x 1 week Then 50 mg 2x/day x 1 week Then 50 mg 3x/day x 1 week Then 100 mg 2x/day - until dose changed  Needs CMP/LFTs/labs checked in 1 month, 3 months and every 6 months while on medication Needs liver function tests at next appointment-    5. Advised that I think pain is nerve pain- I'm not sure increase in Oxy will help- but we will try.    6. F/U in 50month- double appt- MS  7. Caregiver fatigue-  needs some help at home- for shower, etc.  8.  Think pt would be a great candidate- for inpatient rehab-  pt is failing at home at this time, functionally and need ability to fine tune ITB pump- cannot do this in visits to outpatient clinic- she also needs intensive rehab to improve her level of function.    I spent a total of 64   minutes on total care today- >50% coordination of care- due to prolonged discussing grief, nerve vs nociceptive pain, etc as detailed above.

## 2022-02-14 ENCOUNTER — Encounter: Payer: Self-pay | Admitting: Neurology

## 2022-02-19 ENCOUNTER — Encounter: Payer: Self-pay | Admitting: Physical Medicine and Rehabilitation

## 2022-02-21 ENCOUNTER — Telehealth: Payer: Self-pay | Admitting: Neurology

## 2022-02-21 NOTE — Telephone Encounter (Signed)
LVM and sent mychart msg informing pt of need to reschedule 11/16 botox appointment - MD out

## 2022-02-22 ENCOUNTER — Other Ambulatory Visit: Payer: Self-pay

## 2022-02-22 ENCOUNTER — Inpatient Hospital Stay (HOSPITAL_BASED_OUTPATIENT_CLINIC_OR_DEPARTMENT_OTHER): Payer: BLUE CROSS/BLUE SHIELD | Admitting: Oncology

## 2022-02-22 ENCOUNTER — Inpatient Hospital Stay: Payer: BLUE CROSS/BLUE SHIELD | Attending: Oncology

## 2022-02-22 VITALS — BP 118/71 | HR 80 | Temp 97.7°F | Resp 17 | Ht 69.0 in

## 2022-02-22 DIAGNOSIS — D693 Immune thrombocytopenic purpura: Secondary | ICD-10-CM

## 2022-02-22 LAB — CBC WITH DIFFERENTIAL (CANCER CENTER ONLY)
Abs Immature Granulocytes: 0.02 10*3/uL (ref 0.00–0.07)
Basophils Absolute: 0 10*3/uL (ref 0.0–0.1)
Basophils Relative: 0 %
Eosinophils Absolute: 0.2 10*3/uL (ref 0.0–0.5)
Eosinophils Relative: 3 %
HCT: 38.9 % (ref 36.0–46.0)
Hemoglobin: 13.1 g/dL (ref 12.0–15.0)
Immature Granulocytes: 0 %
Lymphocytes Relative: 25 %
Lymphs Abs: 1.7 10*3/uL (ref 0.7–4.0)
MCH: 31.3 pg (ref 26.0–34.0)
MCHC: 33.7 g/dL (ref 30.0–36.0)
MCV: 92.8 fL (ref 80.0–100.0)
Monocytes Absolute: 0.5 10*3/uL (ref 0.1–1.0)
Monocytes Relative: 8 %
Neutro Abs: 4.3 10*3/uL (ref 1.7–7.7)
Neutrophils Relative %: 64 %
Platelet Count: 260 10*3/uL (ref 150–400)
RBC: 4.19 MIL/uL (ref 3.87–5.11)
RDW: 11.9 % (ref 11.5–15.5)
WBC Count: 6.7 10*3/uL (ref 4.0–10.5)
nRBC: 0 % (ref 0.0–0.2)

## 2022-02-22 NOTE — Progress Notes (Signed)
Hematology and Oncology Follow Up Visit  Sharon Odom [id number] [birth year] 51 y.o. 02/22/2022 3:28 PM Sharon Odom, Sharon Blossom, NP-CKiker, Sharon Blossom, NP-C   Principle Diagnosis: 51 year old woman with with acute ITP presenting with platelet count of less than 5 in June 2023.    Prior Therapy:  She received IVIG for a total of 1 g on 2 consecutive days on June 15 and November 10, 2021.  Prednisone 60 mg daily started in June 2023.  She achieved a complete response and the prednisone was tapered off.   Current therapy: Active surveillance.  Interim History: Sharon Odom returns today for a follow-up.  Since the last visit, she has been tapered off prednisone with platelet count on January 11, 2022 was within normal range.  Clinically, she reports no bruising or excessive bleeding.  She denies any hospitalizations or illnesses.  She denies any exacerbation of her multiple sclerosis.     Medications: Updated on review. Current Outpatient Medications  Medication Sig Dispense Refill   acetaminophen (TYLENOL) 500 MG tablet Take 1,000 mg by mouth every 6 (six) hours as needed for moderate pain.     albuterol (VENTOLIN HFA) 108 (90 Base) MCG/ACT inhaler Inhale 1-2 puffs into the lungs every 6 (six) hours as needed for wheezing or shortness of breath. 6.7 g 1   Ascorbic Acid (VITAMIN C) 1000 MG tablet Take 1,000 mg by mouth daily.     baclofen (LIORESAL) 20 MG tablet Take 2 tablets (40 mg total) by mouth 4 (four) times daily. Since ITB pump out (Patient taking differently: Take 20 mg by mouth 3 (three) times daily. Since ITB pump out) 720 tablet 3   buPROPion (WELLBUTRIN XL) 300 MG 24 hr tablet TAKE 1 TABLET DAILY (Patient taking differently: Take 300 mg by mouth daily.) 90 tablet 3   carboxymethylcellulose (REFRESH PLUS) 0.5 % SOLN Place 1 drop into both eyes 3 (three) times daily as needed (dry eyes).     Cholecalciferol (VITAMIN D-3) 125 MCG (5000 UT) TABS Take 5,000 Units by mouth daily. 30 tablet 1    conjugated estrogens (PREMARIN) vaginal cream Place 1 Application vaginally 3 (three) times a week. Blueberry size amount for application     Cyanocobalamin (VITAMIN B-12) 2500 MCG SUBL Place 1 tablet (2,500 mcg total) under the tongue daily. 30 tablet 0   dalfampridine 10 MG TB12 Take 1 tablet (10 mg total) by mouth 2 (two) times daily. One po bid 180 tablet 3   dantrolene (DANTRIUM) 50 MG capsule Take 1 capsule (50 mg total) by mouth at bedtime. X 1 week, then 50 mg 2x/day x  1week, then 50 mg TID x 1 week, then 100 mg 2x/day- for spasticity- con't all other spasticity meds 120 capsule 5   diazepam (VALIUM) 5 MG tablet Take 1.5 tablets (7.5 mg total) by mouth every 8 (eight) hours. 140 tablet 5   furosemide (LASIX) 40 MG tablet Take 40 mg by mouth daily.     lamoTRIgine (LAMICTAL) 25 MG tablet Take one pill daily x 5 days, then one pill twice daily x 5 days, then one pill three times daily x 5 days, then 2 pills twice daily 120 tablet 5   montelukast (SINGULAIR) 10 MG tablet TAKE 1 TABLET DAILY (Patient taking differently: Take 10 mg by mouth daily after breakfast.) 90 tablet 3   Oxycodone HCl 10 MG TABS Take 1 tablet (10 mg total) by mouth every 4 (four) hours as needed. 120 tablet 0   oxymetazoline (AFRIN) 0.05 %  nasal spray Place 1 spray into both nostrils 2 (two) times daily as needed for congestion.     polyethylene glycol (MIRALAX / GLYCOLAX) 17 g packet Take 17 g by mouth daily. Takes BID sometimes     Psyllium (METAMUCIL PO) Take 1 Dose by mouth daily.     senna-docusate (SENOKOT-S) 8.6-50 MG tablet Take 1 tablet by mouth daily. (Patient taking differently: Take 2 tablets by mouth daily.) 30 tablet 0   tiZANidine (ZANAFLEX) 4 MG tablet TAKE 2 TABLETS THREE TIMES A DAY (Patient taking differently: Take 8 mg by mouth 3 (three) times daily.) 720 tablet 3   trospium (SANCTURA) 20 MG tablet Take 20 mg by mouth 2 (two) times daily.     No current facility-administered medications for this visit.      Allergies:  Allergies  Allergen Reactions   Ziconotide Acetate Shortness Of Breath    Anorexia, weird sensations, could not talk, choking feeling, lost since of taste and smell. Hallucinations, vivid dreams. Confusion and sleepiness. N/v, increase in pain.  Anorexia, weird sensations, could not talk, choking feeling, lost since of taste and smell. Hallucinations, vivid dreams. Confusion and sleepiness. N/v, increase in pain.    Morphine Other (See Comments)    Pt does not recall reaction    Amoxicillin Nausea And Vomiting   Lyrica [Pregabalin] Nausea And Vomiting      Physical Exam:   ECOG: 1    General appearance: Alert, awake without any distress. Head: Atraumatic without abnormalities Oropharynx: Without any thrush or ulcers. Eyes: No scleral icterus. Lymph nodes: No lymphadenopathy noted in the cervical, supraclavicular, or axillary nodes Heart:regular rate and rhythm, without any murmurs or gallops.   Lung: Clear to auscultation without any rhonchi, wheezes or dullness to percussion. Abdomin: Soft, nontender without any shifting dullness or ascites. Musculoskeletal: No clubbing or cyanosis. Neurological: No motor or sensory deficits. Skin: No rashes or lesions.      Lab Results: Lab Results  Component Value Date   WBC 5.6 01/11/2022   HGB 12.7 01/11/2022   HCT 39.4 01/11/2022   MCV 98.3 01/11/2022   PLT 228 01/11/2022     Chemistry      Component Value Date/Time   NA 139 01/11/2022 1219   NA 143 04/14/2018 0841   K 3.6 01/11/2022 1219   CL 104 01/11/2022 1219   CO2 29 01/11/2022 1219   BUN 10 01/11/2022 1219   BUN 11 04/14/2018 0841   CREATININE 0.83 01/11/2022 1219      Component Value Date/Time   CALCIUM 9.3 01/11/2022 1219   ALKPHOS 68 11/09/2021 0339   AST 21 11/09/2021 0339   ALT 24 11/09/2021 0339   BILITOT 0.6 11/09/2021 0339          Impression and Plan:   51 year old with:  1.  Acute ITP with head with a platelet count  of less than 5 without any active bleeding.  She achieved a complete response to IVIG and prednisone.    Laboratory data in the last 3 months were reviewed with with platelet count remains over 200.  Different salvage therapy options including restarting steroids, IVIG, rituximab, and others were reiterated.  Her labs are currently pending and will need to be redrawn due to sample issues.  If her platelets continues to be within normal range, no further intervention is needed.  No further follow-up will be required at this time.  2.  Multiple sclerosis: No recent hospitalization or exacerbation.  Continues to follow with neurology.  3.  Follow-up: Will be determined pending the results of her labs.   30  minutes were spent on this encounter.  The time was dedicated to updating her disease status, treatment choices as well as reviewing different salvage therapy options    Zola Button, MD 9/28/20233:28 PM

## 2022-02-23 ENCOUNTER — Encounter: Payer: Self-pay | Admitting: Physical Medicine and Rehabilitation

## 2022-02-23 ENCOUNTER — Other Ambulatory Visit: Payer: Self-pay | Admitting: Physical Medicine and Rehabilitation

## 2022-02-23 MED ORDER — OXYCODONE HCL 10 MG PO TABS
10.0000 mg | ORAL_TABLET | ORAL | 0 refills | Status: DC | PRN
Start: 2022-02-23 — End: 2022-03-27

## 2022-02-23 NOTE — Telephone Encounter (Signed)
PMP was Reviewed.  Oxycodone e-scribed to pharmacy.  My-Chart message will be sent to Sharon Odom

## 2022-03-02 ENCOUNTER — Encounter
Payer: BLUE CROSS/BLUE SHIELD | Attending: Physical Medicine and Rehabilitation | Admitting: Physical Medicine and Rehabilitation

## 2022-03-02 ENCOUNTER — Encounter: Payer: Self-pay | Admitting: Physical Medicine and Rehabilitation

## 2022-03-02 VITALS — BP 123/79 | Ht 69.0 in | Wt 132.8 lb

## 2022-03-02 DIAGNOSIS — G35 Multiple sclerosis: Secondary | ICD-10-CM | POA: Diagnosis present

## 2022-03-02 DIAGNOSIS — Z993 Dependence on wheelchair: Secondary | ICD-10-CM | POA: Insufficient documentation

## 2022-03-02 DIAGNOSIS — R339 Retention of urine, unspecified: Secondary | ICD-10-CM | POA: Insufficient documentation

## 2022-03-02 DIAGNOSIS — R252 Cramp and spasm: Secondary | ICD-10-CM | POA: Insufficient documentation

## 2022-03-02 DIAGNOSIS — R471 Dysarthria and anarthria: Secondary | ICD-10-CM | POA: Diagnosis present

## 2022-03-02 MED ORDER — HYDROMORPHONE HCL 4 MG PO TABS: 4.0000 mg | ORAL_TABLET | Freq: Four times a day (QID) | ORAL | 0 refills | Status: DC | PRN

## 2022-03-02 NOTE — Patient Instructions (Signed)
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 Hoarseness AND dysarthria; not just 1 issue. Insomnia and ITB refills  ITB pump removed due to pump infection 11/10 by Dr Reatha Armour- here for f/u on spasticity and progressive MS.     Decrease Dantrolene to 1 pill 2x/day x 3 days, then stop it.      2. Getting admission into inpt Rehab on 03/13/22. For ITB titration and rehab for decline in function. Bring power w/c to hospital.      3. Went up on Baclofen pump 5% 10/5- wait to titrate until in hospital.        4. Nerve pain- no response to Lamictal-  also hx of no results with Keppra.  In past- also has Sx's with Priault.        5. Might need to do Methadone inpt- so can titrate up for nerve pain- and chronic pain inpt so can make sure things going ok.      6. Will switch to Dilaudid- 4 mg every 6 hours as needed- DO NOT take Oxycodone at the same time/same day- either one or the other- stick with one or the other.    7. F/U in  1 month- and in inpt in 11 days-

## 2022-03-02 NOTE — Progress Notes (Signed)
Subjective:    Patient ID: Sharon Odom, female    DOB: Jul 27, 1970, 51 y.o.   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 Hoarseness AND dysarthria; not just 1 issue. Insomnia and ITB refills  ITB pump removed due to pump infection 11/10 by Dr Reatha Armour- here for f/u on spasticity and progressive MS.  Went up on pump yesterday 10/5- 5%.   Pain is at it's worst when sitting, not laying down.  Thinks it's at 384 mcg/day. Per pt.   Is actually taking 6 pills/day. - we discussed would give 6 pills/day.  Taking 15 mg of oxycodone lasting 5-6 hours- so taking 6 tabs/day.   Wants to change to another pain medicine-    Is on Dantrolene- 50 mg 3x/day- hasn't noticed any improvement- to increase to 100 mg BID tomorrow she thinks  Tried to get in shower- hard time standing up- harder to stand up than before.    LUE is red- But doesn't itch, although appears ot have scratch around L elbow      Pain Inventory Average Pain 8 Pain Right Now 10 My pain is sharp, burning, stabbing, tingling, and aching  LOCATION OF PAIN  shoulder elbow wrist hand fingers left buttocks groin hip thigh knee leg and ankle  BOWEL Number of stools per week: 2 Oral laxative use Yes  Type of laxative miralax metamucil and senokot  BLADDER Foley   Mobility use a wheelchair needs help with transfers  Function disabled: date disabled 2013  Neuro/Psych weakness numbness tremor tingling trouble walking spasms depression  Prior Studies Any changes since last visit?  no  Physicians involved in your care Any changes since last visit?  no   Family History  Problem Relation Age of Onset   Diabetes Mother    Healthy Brother    Social History   Socioeconomic History   Marital status: Married    Spouse name: Shanon Brow   Number of children: Not on file   Years of education: Not on file   Highest education  level: Not on file  Occupational History   Not on file  Tobacco Use   Smoking status: Every Day    Packs/day: 0.50    Years: 32.00    Total pack years: 16.00    Types: Cigarettes    Start date: 1988   Smokeless tobacco: Never  Vaping Use   Vaping Use: Former   Quit date: 05/29/2011  Substance and Sexual Activity   Alcohol use: Not Currently    Comment: 2-3 times per wk   Drug use: Never   Sexual activity: Not Currently    Birth control/protection: Post-menopausal  Other Topics Concern   Not on file  Social History Narrative   Right handed    Caffeine: 1 cup or less per day- coffee   [employer]   Social Determinants of Health   Financial Resource Strain: Not on file  Food Insecurity: No Food Insecurity (04/03/2019)   Hunger Vital Sign    Worried About Running Out of Food in the Last Year: Never true    Ran Out of Food in the Last Year: Never true  Transportation Needs: No Transportation Needs (04/03/2019)   PRAPARE - Hydrologist (Medical): No    Lack of Transportation (Non-Medical): No  Physical Activity: Inactive (04/03/2019)   Exercise Vital Sign    Days of  Exercise per Week: 0 days    Minutes of Exercise per Session: 0 min  Stress: No Stress Concern Present (04/03/2019)   [place]    Feeling of Stress : Not at all  Social Connections: Not on file   Past Surgical History:  Procedure Laterality Date   COLONOSCOPY     EXCISION MORTON'S NEUROMA Right    fallopian tube removal     INTRATHECAL PUMP IMPLANT Right 02/18/2019   Procedure: INTRATHECAL PUMP IMPLANT;  Surgeon: Clydell Hakim, MD;  Location: [place];  Service: Neurosurgery;  Laterality: Right;  INTRATHECAL PUMP IMPLANT   INTRATHECAL PUMP IMPLANT Right 02/17/2021   Procedure: Baclofen Pump Replacement;  Surgeon: Erline Levine, MD;  Location: [place];  Service: Neurosurgery;  Laterality: Right;  3C/RM 21   INTRATHECAL PUMP  REVISION N/A 01/11/2022   Procedure: Baclofen Pump revision;  Surgeon: Dawley, Theodoro Doing, DO;  Location: [place];  Service: Neurosurgery;  Laterality: N/A;  RM 21 to follow   PAIN PUMP IMPLANTATION N/A 09/28/2021   Procedure: Insertion of baclofen pump, right lower quadrant;  Surgeon: Dawley, Theodoro Doing, DO;  Location: [place];  Service: Neurosurgery;  Laterality: N/A;   PAIN PUMP REMOVAL Left 04/06/2021   Procedure: Removal of baclofen pump, Left;  Surgeon: Dawley, Theodoro Doing, DO;  Location: [place];  Service: Neurosurgery;  Laterality: Left;  Lateral/Left//3C rm 19   UPPER GI ENDOSCOPY     WISDOM TOOTH EXTRACTION     WISDOM TOOTH EXTRACTION     Past Medical History:  Diagnosis Date   Allergies    Anxiety    Arthritis    Asthma    seasonal - pollen   Constipation    Depression    Dyspnea    with exertion   GERD (gastroesophageal reflux disease)    Headache    History of hiatal hernia    Left radial nerve palsy 03/15/2020   resolved - Left arm/hand weak   Multiple sclerosis ([place])    Pneumonia 2007   x 1   Vision abnormalities    Wears glasses    BP 123/79   Ht 5\' 9"  (1.753 m)   Wt 132 lb 12.8 oz (60.2 kg) Comment: last recorded  BMI 19.61 kg/m   Opioid Risk Score:   Fall Risk Score:  `1  Depression screen [facility] 2/9     03/02/2022    3:34 PM 02/09/2022    3:27 PM 12/29/2021    2:23 PM 09/22/2021    1:48 PM 06/16/2021    1:43 PM 04/17/2021    2:04 PM 12/21/2020   12:07 PM  Depression screen PHQ 2/9  Decreased Interest 3 3 3 1  0 0 0  Down, Depressed, Hopeless 3 3 3 1  0 0 0  PHQ - 2 Score 6 6 6 2  0 0 0     Review of Systems  Constitutional:  Positive for appetite change and chills.       Poor appetite  HENT: Negative.    Eyes: Negative.   Respiratory: Negative.    Cardiovascular:  Positive for leg swelling.  Gastrointestinal:  Positive for constipation, diarrhea and nausea.  Endocrine: Negative.   Genitourinary:        Foley  Musculoskeletal:  Positive for gait problem and  myalgias.       Spasms  Skin:  Positive for rash.  Allergic/Immunologic: Negative.   Neurological:  Positive for tremors, weakness and numbness.  Hematological: Negative.  Psychiatric/Behavioral:  Positive for dysphoric mood.        Worsening       Objective:   Physical Exam  Awake, alert, harder to understand due to close ot aphonia and dysarthria, accompanied by husband, in manual w/c, NAD  Neuro LUE MAS of 2 RUE MAS of 2 RLE-  hard to test due to extensor tone- MAS of 4? LLE-lose- but has spasms.     Went into extensor tone and couldn't break tone to flex knee again for 30-45 seconds. Mainly RLE  Cannot get out of w/c.   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 Hoarseness AND dysarthria; not just 1 issue. Insomnia and ITB refills  ITB pump removed due to pump infection 11/10 by Dr Reatha Armour- here for f/u on spasticity and progressive MS.   Decrease Dantrolene to 1 pill 2x/day x 3 days, then stop it.    2. Getting admission into inpt Rehab on 03/13/22. For ITB titration and rehab for decline in function. Bring power w/c to hospital.    3. Went up on Baclofen pump 5% 10/5- wait to titrate until in hospital.     4. Nerve pain- no response to Lamictal-  also hx of no results with Keppra.  In past- also has Sx's with [place].     5. Might need to do Methadone inpt- so can titrate up for nerve pain- and chronic pain inpt so can make sure things going ok.    6. Will switch to Dilaudid- 4 mg every 6 hours as needed- DO NOT take Oxycodone at the same time/same day- either one or the other- stick with one or the other.   7. F/U in  1 month- and in inpt in 11 days-   I spent a total of 42  minutes on total care today- >50% coordination of care- due to discussion about pain; calling pharmacy abotu changing meds for pain  and discussing spasticity.

## 2022-03-06 ENCOUNTER — Encounter: Payer: Self-pay | Admitting: Neurology

## 2022-03-07 ENCOUNTER — Encounter: Payer: Self-pay | Admitting: *Deleted

## 2022-03-07 ENCOUNTER — Encounter: Payer: Self-pay | Admitting: Physical Medicine and Rehabilitation

## 2022-03-07 NOTE — Telephone Encounter (Signed)
She can decrease lamotrigine to one 25 mg pill daily for one week, then stop it

## 2022-03-09 ENCOUNTER — Encounter: Payer: Self-pay | Admitting: *Deleted

## 2022-03-09 NOTE — PMR Pre-admission (Signed)
PMR Admission Coordinator Pre-Admission Assessment  Patient: Sharon Odom is an 51 y.o., female MRN: [id number] DOB: Apr 19, 1971 Height: 5\' 9"  (1.753 m) Weight: 60.2 kg  Insurance Information HMO:     PPO:yes      PCP:      IPA:      80/20:      OTHER: self funded HDHP PRIMARY: Associate Professor, [employer]      Policy#: [id number]      Subscriber: spouse CM Name: faxed approval from Care management approved 10/16 through 03/19/22      Phone#: [phone]     Fax#: [phone] Pre-Cert#: [id number] Z approved 10/16 until 10/23      Employer: [employer] Benefits:  Phone #: [phone]     Name: 02/26/22 Eff. Date: 05/28/2016     Deduct: $4000(met)      Out of Pocket Max: $8000 [phone] met)      Life Max: none CIR: 80%      SNF: 80% 130 days Outpatient: 80%     Co-Pay: 20% Home Health: 80%      Co-Pay: 130 combined visits ( 74 remaining) DME: 80%     Co-Pay: 20% Providers: in network  SECONDARY: none  Financial Counselor:       Phone#:   [employer], [employer]" for patients in Inpatient Rehabilitation Facilities with attached "Privacy Act [place] Records" was provided and verbally reviewed with: N/A  Emergency Contact Information Contact Information     Name Relation Home Work Mobile   [place] Spouse   [phone]   Cristan, [place] Relative   [phone]      Current Medical History  Patient Admitting Diagnosis: progressive MS with spasticity  History of Present Illness: 51 year old female with history of 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 Initial MS diagnosis 2013. ITB pump removed 04/06/21 due to pump infection. Revised pump 01/11/22.    Presented on 03/13/22 from home to fine tune ITB pump that can not be done in visits to outpatient clinic. Also in need of intensive rehabilitation to improve her over all level of function.   Currently drinking regular liquids as no  longer in need of nectar thick liquids. Does take pills with applesauce. Has foley indwelling catheter. Continent of bowel but spouse reports BM schedule usually every two weeks and is continent. On miralax, metamucil and senokot. Chronic pain with spasticity issues. Home regimen of Valium, Zabnaflex, and baclofen. Oxycodone reportedly gives little relief. On lamictal for nerve pain. Recently began Dantrolene. Outpatient follow up with Dr Melene [facility]. Last [facility] January 2021.   Patient's medical record from outpatient clinic with Dr Dagoberto Ligas and Dr Felecia Shelling as well as home health with [facility] has been reviewed by the rehabilitation admission coordinator and physician.  Past Medical History  Past Medical History:  Diagnosis Date   Allergies    Anxiety    Arthritis    Asthma    seasonal - pollen   Constipation    Depression    Dyspnea    with exertion   GERD (gastroesophageal reflux disease)    Headache    History of hiatal hernia    Left radial nerve palsy 03/15/2020   resolved - Left arm/hand weak   Multiple sclerosis ([place])    Pneumonia 2007   x 1   Vision abnormalities    Wears glasses    Has the patient had major surgery during 100 days prior to  admission? Yes  Family History   family history includes Diabetes in her mother; Healthy in her brother.  Current Medications  Current Outpatient Medications:    acetaminophen (TYLENOL) 500 MG tablet, Take 1,000 mg by mouth every 6 (six) hours as needed for moderate pain., Disp: , Rfl:    albuterol (VENTOLIN HFA) 108 (90 Base) MCG/ACT inhaler, Inhale 1-2 puffs into the lungs every 6 (six) hours as needed for wheezing or shortness of breath., Disp: 6.7 g, Rfl: 1   Ascorbic Acid (VITAMIN C) 1000 MG tablet, Take 1,000 mg by mouth daily., Disp: , Rfl:    baclofen (LIORESAL) 20 MG tablet, Take 2 tablets (40 mg total) by mouth 4 (four) times daily. Since ITB pump out (Patient taking differently: Take 20 mg by mouth 3 (three)  times daily. Since ITB pump out), Disp: 720 tablet, Rfl: 3   buPROPion (WELLBUTRIN XL) 300 MG 24 hr tablet, TAKE 1 TABLET DAILY (Patient taking differently: Take 300 mg by mouth daily.), Disp: 90 tablet, Rfl: 3   carboxymethylcellulose (REFRESH PLUS) 0.5 % SOLN, Place 1 drop into both eyes 3 (three) times daily as needed (dry eyes)., Disp: , Rfl:    Cholecalciferol (VITAMIN D-3) 125 MCG (5000 UT) TABS, Take 5,000 Units by mouth daily., Disp: 30 tablet, Rfl: 1   conjugated estrogens (PREMARIN) vaginal cream, Place 1 Application vaginally 3 (three) times a week. Blueberry size amount for application, Disp: , Rfl:    Cyanocobalamin (VITAMIN B-12) 2500 MCG SUBL, Place 1 tablet (2,500 mcg total) under the tongue daily., Disp: 30 tablet, Rfl: 0   dalfampridine 10 MG TB12, Take 1 tablet (10 mg total) by mouth 2 (two) times daily. One po bid, Disp: 180 tablet, Rfl: 3   dantrolene (DANTRIUM) 50 MG capsule, Take 1 capsule (50 mg total) by mouth at bedtime. X 1 week, then 50 mg 2x/day x  1week, then 50 mg TID x 1 week, then 100 mg 2x/day- for spasticity- con't all other spasticity meds, Disp: 120 capsule, Rfl: 5   diazepam (VALIUM) 5 MG tablet, Take 1.5 tablets (7.5 mg total) by mouth every 8 (eight) hours., Disp: 140 tablet, Rfl: 5   furosemide (LASIX) 40 MG tablet, Take 40 mg by mouth daily., Disp: , Rfl:    HYDROmorphone (DILAUDID) 4 MG tablet, Take 1 tablet (4 mg total) by mouth every 6 (six) hours as needed for severe pain. For chronic pain due to MS- to take the place of Oxycodone, Disp: 120 tablet, Rfl: 0   lamoTRIgine (LAMICTAL) 25 MG tablet, Take one pill daily x 5 days, then one pill twice daily x 5 days, then one pill three times daily x 5 days, then 2 pills twice daily, Disp: 120 tablet, Rfl: 5   montelukast (SINGULAIR) 10 MG tablet, TAKE 1 TABLET DAILY (Patient taking differently: Take 10 mg by mouth daily after breakfast.), Disp: 90 tablet, Rfl: 3   Oxycodone HCl 10 MG TABS, Take 1 tablet (10 mg  total) by mouth every 4 (four) hours as needed., Disp: 120 tablet, Rfl: 0   oxymetazoline (AFRIN) 0.05 % nasal spray, Place 1 spray into both nostrils 2 (two) times daily as needed for congestion., Disp: , Rfl:    polyethylene glycol (MIRALAX / GLYCOLAX) 17 g packet, Take 17 g by mouth daily. Takes BID sometimes, Disp: , Rfl:    Psyllium (METAMUCIL PO), Take 1 Dose by mouth daily., Disp: , Rfl:    raNITIdine HCl (ZANTAC PO), Take by mouth., Disp: , Rfl:  senna-docusate (SENOKOT-S) 8.6-50 MG tablet, Take 1 tablet by mouth daily. (Patient taking differently: Take 2 tablets by mouth daily.), Disp: 30 tablet, Rfl: 0   tiZANidine (ZANAFLEX) 4 MG tablet, TAKE 2 TABLETS THREE TIMES A DAY (Patient taking differently: Take 8 mg by mouth 3 (three) times daily.), Disp: 720 tablet, Rfl: 3   trospium (SANCTURA) 20 MG tablet, Take 20 mg by mouth 2 (two) times daily., Disp: , Rfl:   Patients Current Diet: Diet Regular diet with thin liquids. Meds with applesauce  Precautions / Restrictions Precautions: Fall Weight Bearing Restrictions: No   Has the patient had 2 or more falls or a fall with injury in the past year? No  Prior Activity Level Household: uses power wheelchair, limited mobility, transfers only Uses power wheelchair with all activity. Sleeps in chair or on couch. Can drive wheelchair into handicapped shower and transfers to shower bench. Uses portable BSC transfers for bowel program. Has vertical power lift in garage to go from power wheelchair to car. Uses manuel wheelchair in the community for appointments. Corene Cornea with [facility] to transport power wheelchair to CIR on day of admission 03/13/22. Spouse lifts her onto couch, into car, etc as she has not stood safely for months.  Prior Functional Level Self Care: Did the patient need help bathing, dressing, using the toilet or eating? Needed some help  Indoor Mobility: Did the patient need assistance with walking from room to room (with or  without device)? Dependent  Stairs: Did the patient need assistance with internal or external stairs (with or without device)? Dependent  Functional Cognition: Did the patient need help planning regular tasks such as shopping or remembering to take medications? Independent  Patient Information Are you of Hispanic, Latino/a,or Spanish origin?: A. No, not of Hispanic, Latino/a, or Spanish origin What is your race?: A. White Do you need or want an interpreter to communicate with a doctor or health care staff?: 0. No  Patient's Response To:  Health Literacy and Transportation Is the patient able to respond to health literacy and transportation needs?: Yes Health Literacy - How often do you need to have someone help you when you read instructions, pamphlets, or other written material from your doctor or pharmacy?: Never In the past 12 months, has lack of transportation kept you from medical appointments or from getting medications?: No In the past 12 months, has lack of transportation kept you from meetings, work, or from getting things needed for daily living?: No  Home Assistive Devices / Equipment Other (Comment) (power wheelchair with [employer])   Prior Device Use: Indicate devices/aids used by the patient prior to current illness, exacerbation or injury? Manual wheelchair, Motorized wheelchair or scooter, and Mechanical lift   Prior Functional Level Current Functional Level  Bed Mobility  Min assist Min assist   Transfers  Mod assist  Mod assist   Mobility - Walk/Wheelchair  Total assist  Total assist   Upper Body Dressing  Min assist  Min assist   Lower Body Dressing  Max assist  Max assist   Grooming  Min assist  Min assist   Eating/Drinking  Mod Independent  Mod Independent   Toilet Transfer  Mod assist  Mod assist   Bladder Continence   Foley catheter  Foley catheter   Bowel Management  continent  continent   Stair Climbing  Other  Other    Communication  chronic dysarthria  chronic dysarthria   Memory  intact intact     Special  Needs/ Care Considerations Indwelling catheter chronically.Typically sleeps in her power wheelchair or on couch to sleep.   Previous Home Environment  Living Arrangements: Spouse/significant other  Lives With: Spouse Available Help at Discharge: Family; Available PRN/intermittently (spouse works) Type of Home: [employer] Layout: Two level; Able to live on main level with bedroom/bathroom Alternate Level Stairs-Number of Steps: Flight Home Access: Other (comment) Bathroom Shower/Tub: Multimedia programmer: Handicapped height Bathroom Accessibility: Yes How Accessible: Accessible via wheelchair; Accessible via walker [place]: Yes Type of Home Care Services: [place] (if known): [facility] Additional Comments: receiving HH PT only  Discharge Living Setting Plans for Discharge Living Setting: Patient's home; Lives with (comment) (spouse) Type of Home at Discharge: House Discharge Home Layout: Two level; Able to live on main level with bedroom/bathroom Alternate Level Stairs-Number of Steps: flight Discharge Home Access: [place] entrance Discharge Bathroom Shower/Tub: Walk-in shower Discharge Bathroom Toilet: Handicapped height Discharge Bathroom Accessibility: Yes How Accessible: Accessible via wheelchair; Accessible via walker Does the patient have any problems obtaining your medications?: No  Social/Family/Support Systems Patient Roles: Spouse Contact Information: spouse, Shanon Brow Anticipated Caregiver: Shanon Brow Anticipated Caregiver's Contact Information: [phone] Ability/Limitations of Caregiver: works day shift Caregiver Availability: Evenings only Discharge Plan Discussed with Primary Caregiver: Yes Is Caregiver In Agreement with Plan?: Yes Does Caregiver/Family have Issues with Lodging/Transportation while Pt is in Rehab?:  No  Goals Patient/Family Goal for Rehab: Mod I transfers to wheelchair, Mod I wheelchair level with PT and OT, Mod I SLP; supervision short distance mobility with Rollator Expected length of stay: ELOS 7 to 10 days Pt/Family Agrees to Admission and willing to participate: Yes Program Orientation Provided & Reviewed with Pt/Caregiver Including Roles  & Responsibilities: Yes  Barriers to Discharge: Decreased caregiver support  Decrease burden of Care through IP rehab admission: n/a  Possible need for SNF placement upon discharge: not anticipated  Patient Condition: I have reviewed medical records from [facility] clinic with Dr Dagoberto Ligas and Dr Felecia Shelling as well as [facility] PT asessments, spoken patient and spouse. I discussed via phone for inpatient rehabilitation assessment.  Patient will benefit from ongoing PT, OT, and SLP, can actively participate in 3 hours of therapy a day 5 days of the week, and can make measurable gains during the admission.  Patient will also benefit from the coordinated team approach during an Inpatient Acute Rehabilitation admission.  The patient will receive intensive therapy as well as Rehabilitation physician, nursing, social worker, and care management interventions.  Due to bladder management, bowel management, safety, skin/wound care, disease management, medication administration, pain management, and patient education the patient requires 24 hour a day rehabilitation nursing.  The patient is currently mod to max asisst at wheelchair level with mobility and basic ADLs.  Discharge setting and therapy post discharge at home with home health is anticipated.  Patient has agreed to participate in the Acute Inpatient Rehabilitation Program and will admit today.  Preadmission Screen Completed By:  Cleatrice Burke, 03/09/2022 12:43 PM ______________________________________________________________________   Discussed status with Dr. Marland Kitchen on *** at *** and received  approval for admission today.  Admission Coordinator:  Cleatrice Burke, RN, time Marland KitchenSudie Grumbling ***   Assessment/Plan: Diagnosis: Does the need for close, 24 hr/day Medical supervision in concert with the patient's rehab needs make it unreasonable for this patient to be served in a less intensive setting? {yes_no_potentially:3041433} Co-Morbidities requiring supervision/potential complications: *** Due to {due NG:3943200}, does the patient require 24 hr/day rehab nursing? {yes_no_potentially:3041433}  Does the patient require coordinated care of a physician, rehab nurse, PT, OT, and SLP to address physical and functional deficits in the context of the above medical diagnosis(es)? {yes_no_potentially:3041433} Addressing deficits in the following areas: {deficits:3041436} Can the patient actively participate in an intensive therapy program of at least 3 hrs of therapy 5 days a week? {yes_no_potentially:3041433} The potential for patient to make measurable gains while on inpatient rehab is {potential:3041437} Anticipated functional outcomes upon discharge from inpatient rehab: {functional outcomes:304600100} PT, {functional outcomes:304600100} OT, {functional outcomes:304600100} SLP Estimated rehab length of stay to reach the above functional goals is: *** Anticipated discharge destination: {anticipated dc setting:21604} 10. Overall Rehab/Functional Prognosis: {potential:3041437}  MD Signature ***

## 2022-03-12 ENCOUNTER — Other Ambulatory Visit (HOSPITAL_COMMUNITY): Payer: Self-pay | Admitting: Physician Assistant

## 2022-03-12 DIAGNOSIS — G35 Multiple sclerosis: Secondary | ICD-10-CM

## 2022-03-13 ENCOUNTER — Inpatient Hospital Stay (HOSPITAL_COMMUNITY)
Admission: RE | Admit: 2022-03-13 | Discharge: 2022-03-27 | DRG: 945 | Disposition: A | Discharge status: Other Acute Inpatient Hospital | Payer: BLUE CROSS/BLUE SHIELD | Source: Ambulatory Visit | Attending: Physical Medicine and Rehabilitation | Admitting: Physical Medicine and Rehabilitation

## 2022-03-13 ENCOUNTER — Other Ambulatory Visit (HOSPITAL_COMMUNITY): Payer: Self-pay | Admitting: Physician Assistant

## 2022-03-13 ENCOUNTER — Inpatient Hospital Stay (HOSPITAL_COMMUNITY): Payer: BLUE CROSS/BLUE SHIELD

## 2022-03-13 DIAGNOSIS — E876 Hypokalemia: Secondary | ICD-10-CM | POA: Diagnosis present

## 2022-03-13 DIAGNOSIS — G35 Multiple sclerosis: Secondary | ICD-10-CM | POA: Diagnosis present

## 2022-03-13 DIAGNOSIS — R600 Localized edema: Secondary | ICD-10-CM | POA: Diagnosis present

## 2022-03-13 DIAGNOSIS — R471 Dysarthria and anarthria: Secondary | ICD-10-CM | POA: Diagnosis not present

## 2022-03-13 DIAGNOSIS — N319 Neuromuscular dysfunction of bladder, unspecified: Secondary | ICD-10-CM | POA: Diagnosis present

## 2022-03-13 DIAGNOSIS — Z833 Family history of diabetes mellitus: Secondary | ICD-10-CM | POA: Diagnosis not present

## 2022-03-13 DIAGNOSIS — G63 Polyneuropathy in diseases classified elsewhere: Secondary | ICD-10-CM | POA: Diagnosis present

## 2022-03-13 DIAGNOSIS — Z9689 Presence of other specified functional implants: Secondary | ICD-10-CM | POA: Diagnosis present

## 2022-03-13 DIAGNOSIS — F39 Unspecified mood [affective] disorder: Secondary | ICD-10-CM | POA: Diagnosis present

## 2022-03-13 DIAGNOSIS — Z993 Dependence on wheelchair: Secondary | ICD-10-CM

## 2022-03-13 DIAGNOSIS — F32A Depression, unspecified: Secondary | ICD-10-CM | POA: Diagnosis present

## 2022-03-13 DIAGNOSIS — Z789 Other specified health status: Secondary | ICD-10-CM | POA: Diagnosis not present

## 2022-03-13 DIAGNOSIS — N179 Acute kidney failure, unspecified: Secondary | ICD-10-CM | POA: Diagnosis not present

## 2022-03-13 DIAGNOSIS — Z515 Encounter for palliative care: Secondary | ICD-10-CM | POA: Diagnosis not present

## 2022-03-13 DIAGNOSIS — R1310 Dysphagia, unspecified: Secondary | ICD-10-CM | POA: Diagnosis present

## 2022-03-13 DIAGNOSIS — F419 Anxiety disorder, unspecified: Secondary | ICD-10-CM | POA: Diagnosis present

## 2022-03-13 DIAGNOSIS — G801 Spastic diplegic cerebral palsy: Secondary | ICD-10-CM | POA: Diagnosis present

## 2022-03-13 DIAGNOSIS — E162 Hypoglycemia, unspecified: Secondary | ICD-10-CM | POA: Diagnosis present

## 2022-03-13 DIAGNOSIS — Z5189 Encounter for other specified aftercare: Principal | ICD-10-CM

## 2022-03-13 DIAGNOSIS — Z881 Allergy status to other antibiotic agents status: Secondary | ICD-10-CM | POA: Diagnosis not present

## 2022-03-13 DIAGNOSIS — H532 Diplopia: Secondary | ICD-10-CM | POA: Diagnosis present

## 2022-03-13 DIAGNOSIS — R5381 Other malaise: Secondary | ICD-10-CM | POA: Diagnosis present

## 2022-03-13 DIAGNOSIS — F17210 Nicotine dependence, cigarettes, uncomplicated: Secondary | ICD-10-CM | POA: Diagnosis present

## 2022-03-13 DIAGNOSIS — N390 Urinary tract infection, site not specified: Secondary | ICD-10-CM | POA: Diagnosis not present

## 2022-03-13 DIAGNOSIS — Z9889 Other specified postprocedural states: Secondary | ICD-10-CM

## 2022-03-13 DIAGNOSIS — R269 Unspecified abnormalities of gait and mobility: Secondary | ICD-10-CM | POA: Diagnosis present

## 2022-03-13 DIAGNOSIS — M62838 Other muscle spasm: Secondary | ICD-10-CM | POA: Diagnosis present

## 2022-03-13 DIAGNOSIS — R627 Adult failure to thrive: Secondary | ICD-10-CM | POA: Diagnosis not present

## 2022-03-13 DIAGNOSIS — Z888 Allergy status to other drugs, medicaments and biological substances status: Secondary | ICD-10-CM

## 2022-03-13 DIAGNOSIS — D649 Anemia, unspecified: Secondary | ICD-10-CM | POA: Diagnosis not present

## 2022-03-13 DIAGNOSIS — F418 Other specified anxiety disorders: Secondary | ICD-10-CM | POA: Diagnosis not present

## 2022-03-13 DIAGNOSIS — Z538 Procedure and treatment not carried out for other reasons: Secondary | ICD-10-CM | POA: Diagnosis not present

## 2022-03-13 DIAGNOSIS — Z978 Presence of other specified devices: Secondary | ICD-10-CM | POA: Diagnosis not present

## 2022-03-13 DIAGNOSIS — Z885 Allergy status to narcotic agent status: Secondary | ICD-10-CM

## 2022-03-13 DIAGNOSIS — J45909 Unspecified asthma, uncomplicated: Secondary | ICD-10-CM | POA: Diagnosis present

## 2022-03-13 DIAGNOSIS — B961 Klebsiella pneumoniae [K. pneumoniae] as the cause of diseases classified elsewhere: Secondary | ICD-10-CM | POA: Diagnosis present

## 2022-03-13 DIAGNOSIS — K219 Gastro-esophageal reflux disease without esophagitis: Secondary | ICD-10-CM | POA: Diagnosis present

## 2022-03-13 DIAGNOSIS — G894 Chronic pain syndrome: Secondary | ICD-10-CM | POA: Diagnosis present

## 2022-03-13 DIAGNOSIS — K047 Periapical abscess without sinus: Secondary | ICD-10-CM | POA: Diagnosis present

## 2022-03-13 DIAGNOSIS — G379 Demyelinating disease of central nervous system, unspecified: Secondary | ICD-10-CM | POA: Diagnosis present

## 2022-03-13 DIAGNOSIS — E860 Dehydration: Secondary | ICD-10-CM | POA: Diagnosis present

## 2022-03-13 DIAGNOSIS — K592 Neurogenic bowel, not elsewhere classified: Secondary | ICD-10-CM | POA: Diagnosis present

## 2022-03-13 DIAGNOSIS — J4520 Mild intermittent asthma, uncomplicated: Secondary | ICD-10-CM | POA: Diagnosis present

## 2022-03-13 DIAGNOSIS — K5900 Constipation, unspecified: Secondary | ICD-10-CM | POA: Diagnosis present

## 2022-03-13 DIAGNOSIS — Z79899 Other long term (current) drug therapy: Secondary | ICD-10-CM | POA: Diagnosis not present

## 2022-03-13 DIAGNOSIS — G9341 Metabolic encephalopathy: Secondary | ICD-10-CM | POA: Diagnosis not present

## 2022-03-13 DIAGNOSIS — Z780 Asymptomatic menopausal state: Secondary | ICD-10-CM

## 2022-03-13 LAB — COMPREHENSIVE METABOLIC PANEL
ALT: 15 U/L (ref 0–44)
AST: 20 U/L (ref 15–41)
Albumin: 3.5 g/dL (ref 3.5–5.0)
Alkaline Phosphatase: 62 U/L (ref 38–126)
Anion gap: 7 (ref 5–15)
BUN: 7 mg/dL (ref 6–20)
CO2: 31 mmol/L (ref 22–32)
Calcium: 9.6 mg/dL (ref 8.9–10.3)
Chloride: 102 mmol/L (ref 98–111)
Creatinine, Ser: 0.71 mg/dL (ref 0.44–1.00)
GFR, Estimated: >60 mL/min (ref >60)
Glucose, Bld: 99 mg/dL (ref 70–99)
Potassium: 3.3 mmol/L — ABNORMAL LOW (ref 3.5–5.1)
Sodium: 140 mmol/L (ref 135–145)
Total Bilirubin: 0.5 mg/dL (ref 0.3–1.2)
Total Protein: 6.4 g/dL — ABNORMAL LOW (ref 6.5–8.1)

## 2022-03-13 LAB — CBC WITH DIFFERENTIAL/PLATELET
Abs Immature Granulocytes: 0.01 10*3/uL (ref 0.00–0.07)
Basophils Absolute: 0 10*3/uL (ref 0.0–0.1)
Basophils Relative: 0 %
Eosinophils Absolute: 0.1 10*3/uL (ref 0.0–0.5)
Eosinophils Relative: 1 %
HCT: 36 % (ref 36.0–46.0)
Hemoglobin: 11.7 g/dL — ABNORMAL LOW (ref 12.0–15.0)
Immature Granulocytes: 0 %
Lymphocytes Relative: 18 %
Lymphs Abs: 1.2 10*3/uL (ref 0.7–4.0)
MCH: 31.4 pg (ref 26.0–34.0)
MCHC: 32.5 g/dL (ref 30.0–36.0)
MCV: 96.5 fL (ref 80.0–100.0)
Monocytes Absolute: 0.4 10*3/uL (ref 0.1–1.0)
Monocytes Relative: 6 %
Neutro Abs: 5 10*3/uL (ref 1.7–7.7)
Neutrophils Relative %: 75 %
Platelets: 267 10*3/uL (ref 150–400)
RBC: 3.73 MIL/uL — ABNORMAL LOW (ref 3.87–5.11)
RDW: 12.7 % (ref 11.5–15.5)
WBC: 6.6 10*3/uL (ref 4.0–10.5)
nRBC: 0 % (ref 0.0–0.2)

## 2022-03-13 MED ORDER — GUAIFENESIN-DM 100-10 MG/5ML PO SYRP: 5.0000 mL | ORAL_SOLUTION | Freq: Four times a day (QID) | ORAL | Status: DC | PRN

## 2022-03-13 MED ORDER — BACLOFEN 10 MG PO TABS: 20.0000 mg | ORAL_TABLET | Freq: Two times a day (BID) | ORAL | Status: DC

## 2022-03-13 MED ORDER — DALFAMPRIDINE ER 10 MG PO TB12
10.0000 mg | ORAL_TABLET | Freq: Two times a day (BID) | ORAL | Status: DC
  Administered 2022-03-14: 10 mg via ORAL
  Filled 2022-03-13 (×2): qty 1

## 2022-03-13 MED ORDER — METRONIDAZOLE 500 MG PO TABS
250.0000 mg | ORAL_TABLET | Freq: Three times a day (TID) | ORAL | Status: DC
  Administered 2022-03-14 (×2): 250 mg via ORAL
  Filled 2022-03-13 (×3): qty 1

## 2022-03-13 MED ORDER — CYANOCOBALAMIN 500 MCG PO TABS
2500.0000 ug | ORAL_TABLET | Freq: Every day | ORAL | Status: DC
  Administered 2022-03-14 – 2022-03-27 (×14): 2500 ug via ORAL
  Filled 2022-03-13 (×14): qty 5

## 2022-03-13 MED ORDER — DIPHENHYDRAMINE HCL 12.5 MG/5ML PO ELIX: 12.5000 mg | ORAL_SOLUTION | Freq: Four times a day (QID) | ORAL | Status: DC | PRN

## 2022-03-13 MED ORDER — PSYLLIUM 95 % PO PACK
1.0000 | PACK | Freq: Every day | ORAL | Status: DC
  Filled 2022-03-13 (×3): qty 1

## 2022-03-13 MED ORDER — TIZANIDINE HCL 2 MG PO TABS
8.0000 mg | ORAL_TABLET | Freq: Three times a day (TID) | ORAL | Status: DC
  Administered 2022-03-13 – 2022-03-24 (×32): 8 mg via ORAL
  Filled 2022-03-13 (×32): qty 4

## 2022-03-13 MED ORDER — ALUM & MAG HYDROXIDE-SIMETH 200-200-20 MG/5ML PO SUSP
30.0000 mL | ORAL | Status: DC | PRN
  Administered 2022-03-18 – 2022-03-24 (×5): 30 mL via ORAL
  Filled 2022-03-13 (×6): qty 30

## 2022-03-13 MED ORDER — PROCHLORPERAZINE 25 MG RE SUPP: 12.5000 mg | Freq: Four times a day (QID) | RECTAL | Status: DC | PRN

## 2022-03-13 MED ORDER — BACLOFEN 10 MG PO TABS: 20.0000 mg | ORAL_TABLET | Freq: Three times a day (TID) | ORAL | Status: DC

## 2022-03-13 MED ORDER — ACETAMINOPHEN 325 MG PO TABS: 325.0000 mg | ORAL_TABLET | ORAL | Status: DC | PRN

## 2022-03-13 MED ORDER — CARBOXYMETHYLCELLULOSE SODIUM 0.5 % OP SOLN: 1.0000 [drp] | Freq: Three times a day (TID) | OPHTHALMIC | Status: DC | PRN

## 2022-03-13 MED ORDER — VITAMIN D 25 MCG (1000 UNIT) PO TABS
5000.0000 [IU] | ORAL_TABLET | Freq: Every day | ORAL | Status: DC
  Administered 2022-03-14 – 2022-03-27 (×14): 5000 [IU] via ORAL
  Filled 2022-03-13 (×14): qty 5

## 2022-03-13 MED ORDER — OXYCODONE HCL 5 MG PO TABS
15.0000 mg | ORAL_TABLET | ORAL | Status: DC | PRN
  Administered 2022-03-13 – 2022-03-17 (×18): 15 mg via ORAL
  Filled 2022-03-13 (×19): qty 3

## 2022-03-13 MED ORDER — BACLOFEN 10 MG PO TABS
20.0000 mg | ORAL_TABLET | ORAL | Status: DC
  Administered 2022-03-13 – 2022-03-14 (×2): 20 mg via ORAL
  Administered 2022-03-14: 40 mg via ORAL
  Administered 2022-03-15 (×2): 20 mg via ORAL
  Administered 2022-03-16 – 2022-03-17 (×4): 40 mg via ORAL
  Administered 2022-03-18 – 2022-03-19 (×3): 20 mg via ORAL
  Administered 2022-03-19: 40 mg via ORAL
  Administered 2022-03-20: 20 mg via ORAL
  Administered 2022-03-20 – 2022-03-21 (×3): 40 mg via ORAL
  Administered 2022-03-22: 20 mg via ORAL
  Administered 2022-03-22: 40 mg via ORAL
  Administered 2022-03-23 – 2022-03-24 (×4): 20 mg via ORAL
  Administered 2022-03-25 – 2022-03-26 (×4): 40 mg via ORAL
  Administered 2022-03-27: 20 mg via ORAL
  Filled 2022-03-13: qty 4
  Filled 2022-03-13: qty 2
  Filled 2022-03-13 (×3): qty 4
  Filled 2022-03-13: qty 2
  Filled 2022-03-13 (×2): qty 4
  Filled 2022-03-13: qty 2
  Filled 2022-03-13 (×3): qty 4
  Filled 2022-03-13: qty 2
  Filled 2022-03-13 (×2): qty 4
  Filled 2022-03-13 (×4): qty 2
  Filled 2022-03-13: qty 4
  Filled 2022-03-13 (×2): qty 2
  Filled 2022-03-13: qty 4
  Filled 2022-03-13: qty 2
  Filled 2022-03-13 (×3): qty 4
  Filled 2022-03-13: qty 2
  Filled 2022-03-13 (×2): qty 4
  Filled 2022-03-13: qty 2

## 2022-03-13 MED ORDER — MONTELUKAST SODIUM 10 MG PO TABS
10.0000 mg | ORAL_TABLET | Freq: Every day | ORAL | Status: DC
  Administered 2022-03-17 – 2022-03-27 (×10): 10 mg via ORAL
  Filled 2022-03-13 (×11): qty 1

## 2022-03-13 MED ORDER — FUROSEMIDE 40 MG PO TABS
80.0000 mg | ORAL_TABLET | Freq: Every day | ORAL | Status: DC
  Administered 2022-03-14 – 2022-03-27 (×14): 80 mg via ORAL
  Filled 2022-03-13 (×14): qty 2

## 2022-03-13 MED ORDER — NAPROXEN 250 MG PO TABS: 250.0000 mg | ORAL_TABLET | Freq: Two times a day (BID) | ORAL | Status: DC | PRN

## 2022-03-13 MED ORDER — SENNOSIDES-DOCUSATE SODIUM 8.6-50 MG PO TABS
2.0000 | ORAL_TABLET | Freq: Every day | ORAL | Status: DC
  Administered 2022-03-14 – 2022-03-15 (×2): 2 via ORAL
  Filled 2022-03-13 (×3): qty 2

## 2022-03-13 MED ORDER — CHLORHEXIDINE GLUCONATE CLOTH 2 % EX PADS
6.0000 | MEDICATED_PAD | Freq: Every day | CUTANEOUS | Status: DC
  Administered 2022-03-13 – 2022-03-14 (×2): 6 via TOPICAL

## 2022-03-13 MED ORDER — BUPROPION HCL ER (XL) 300 MG PO TB24
300.0000 mg | ORAL_TABLET | Freq: Every day | ORAL | Status: DC
  Administered 2022-03-14 – 2022-03-27 (×14): 300 mg via ORAL
  Filled 2022-03-13 (×14): qty 1

## 2022-03-13 MED ORDER — OXYMETAZOLINE HCL 0.05 % NA SOLN: 1.0000 | Freq: Two times a day (BID) | NASAL | Status: AC | PRN

## 2022-03-13 MED ORDER — ALBUTEROL SULFATE (2.5 MG/3ML) 0.083% IN NEBU: 2.5000 mg | INHALATION_SOLUTION | Freq: Four times a day (QID) | RESPIRATORY_TRACT | Status: DC | PRN

## 2022-03-13 MED ORDER — BACLOFEN 40 MG/20ML IT SOLN
80.0000 mg | Freq: Once | INTRATHECAL | Status: DC
  Filled 2022-03-13: qty 40

## 2022-03-13 MED ORDER — PROCHLORPERAZINE MALEATE 5 MG PO TABS: 5.0000 mg | ORAL_TABLET | Freq: Four times a day (QID) | ORAL | Status: DC | PRN

## 2022-03-13 MED ORDER — ALBUTEROL SULFATE HFA 108 (90 BASE) MCG/ACT IN AERS: 2.0000 | INHALATION_SPRAY | Freq: Four times a day (QID) | RESPIRATORY_TRACT | Status: DC | PRN

## 2022-03-13 MED ORDER — METHOCARBAMOL 500 MG PO TABS
500.0000 mg | ORAL_TABLET | Freq: Four times a day (QID) | ORAL | Status: DC | PRN
  Administered 2022-03-19 – 2022-03-24 (×5): 500 mg via ORAL
  Filled 2022-03-13 (×5): qty 1

## 2022-03-13 MED ORDER — PANTOPRAZOLE SODIUM 40 MG PO TBEC
40.0000 mg | DELAYED_RELEASE_TABLET | Freq: Every day | ORAL | Status: DC
  Administered 2022-03-14 – 2022-03-27 (×14): 40 mg via ORAL
  Filled 2022-03-13 (×14): qty 1

## 2022-03-13 MED ORDER — PROCHLORPERAZINE EDISYLATE 10 MG/2ML IJ SOLN: 5.0000 mg | Freq: Four times a day (QID) | INTRAMUSCULAR | Status: DC | PRN

## 2022-03-13 MED ORDER — DIAZEPAM 5 MG PO TABS
7.5000 mg | ORAL_TABLET | Freq: Three times a day (TID) | ORAL | Status: DC
  Administered 2022-03-13 – 2022-03-27 (×42): 7.5 mg via ORAL
  Filled 2022-03-13 (×43): qty 2

## 2022-03-13 MED ORDER — TRAZODONE HCL 50 MG PO TABS
25.0000 mg | ORAL_TABLET | Freq: Every evening | ORAL | Status: DC | PRN
  Administered 2022-03-21 – 2022-03-26 (×4): 50 mg via ORAL
  Filled 2022-03-13 (×4): qty 1

## 2022-03-13 MED ORDER — IBUPROFEN 200 MG PO TABS: 200.0000 mg | ORAL_TABLET | Freq: Four times a day (QID) | ORAL | Status: DC | PRN

## 2022-03-13 MED ORDER — ACETAMINOPHEN 500 MG PO TABS
1000.0000 mg | ORAL_TABLET | Freq: Four times a day (QID) | ORAL | Status: DC | PRN
  Administered 2022-03-13 – 2022-03-27 (×26): 1000 mg via ORAL
  Filled 2022-03-13 (×28): qty 2

## 2022-03-13 MED ORDER — ENOXAPARIN SODIUM 40 MG/0.4ML IJ SOSY
40.0000 mg | PREFILLED_SYRINGE | INTRAMUSCULAR | Status: DC
  Administered 2022-03-14 – 2022-03-27 (×14): 40 mg via SUBCUTANEOUS
  Filled 2022-03-13 (×14): qty 0.4

## 2022-03-13 MED ORDER — NON FORMULARY
20.0000 mg | Freq: Two times a day (BID) | Status: DC
  Administered 2022-03-14: 20 mg via ORAL

## 2022-03-13 MED ORDER — VITAMIN C 500 MG PO TABS
1000.0000 mg | ORAL_TABLET | Freq: Every day | ORAL | Status: DC
  Administered 2022-03-14 – 2022-03-27 (×14): 1000 mg via ORAL
  Filled 2022-03-13 (×14): qty 2

## 2022-03-13 MED ORDER — VITAMIN C 500 MG PO TABS: 1000.0000 mg | ORAL_TABLET | Freq: Every day | ORAL | Status: DC

## 2022-03-13 MED ORDER — BACLOFEN 10 MG PO TABS
40.0000 mg | ORAL_TABLET | Freq: Every day | ORAL | Status: DC
  Administered 2022-03-14 – 2022-03-27 (×14): 40 mg via ORAL
  Filled 2022-03-13 (×15): qty 4

## 2022-03-13 NOTE — Progress Notes (Incomplete)
INPATIENT REHABILITATION ADMISSION NOTE   Arrival Method:wheelchair     Mental Orientation:alert and oriented   Assessment:done   Skin:   IV'S:none   Pain:   Tubes and Drains:   Safety Measures:   Vital Signs:done   Height and Weight:done   Rehab Orientation:done   Family: husband   Notes:

## 2022-03-13 NOTE — Progress Notes (Signed)
Inpatient Rehabilitation Admission Medication Review by a Pharmacist  A complete drug regimen review was completed for this patient to identify any potential clinically significant medication issues.  High Risk Drug Classes Is patient taking? Indication by Medication  Antipsychotic Yes Compazine PO/IM - N/V  Anticoagulant Yes Lovenox - DVT px  Antibiotic Yes Flagyl - tooth infection  Opioid Yes Oxycodone - pain  Antiplatelet No   Hypoglycemics/insulin No   Vasoactive Medication No   Chemotherapy No   Other Yes Baclofen pump - spasms/MS Dalfampridine - MS Zanaflex - spasms Singulair - allergy Robaxin/diazepam - spasms Ibuprofen - pain Wellbutrin - depression      Type of Medication Issue Identified Description of Issue Recommendation(s)  Drug Interaction(s) (clinically significant)     Duplicate Therapy     Allergy     No Medication Administration End Date     Incorrect Dose     Additional Drug Therapy Needed     Significant med changes from prior encounter (inform family/care partners about these prior to discharge).    Other       Clinically significant medication issues were identified that warrant physician communication and completion of prescribed/recommended actions by midnight of the next day:  No  Name of provider notified for urgent issues identified:   Provider Method of Notification:     Pharmacist comments:   Time spent performing this drug regimen review (minutes):  [place], PharmD, [place], AAHIVP, CPP Infectious Disease Pharmacist 03/13/2022 7:41 PM

## 2022-03-13 NOTE — H&P (Signed)
Physical Medicine and Rehabilitation Admission H&P     CC: Functional deficits secondary to secondary progressive multiple sclerosis   HPI: Sharon Odom is a 51 year old female with a history of secondary progressive multiple sclerosis. She has had significant left greater than right spasticity with subsequent implantation of intrathecal pump for baclofen administration. She underwent revision of the abdominal portion of the pump by Dr. Reatha Armour on 01/11/2022. At the time of follow up on 10/6, she reported worsening pain especially when sitting despite increase in 5% increase in baclofen pump. She has been taking oral baclofen 20 mg six/day, oxycodone 10 mg six/day and on Dantrolene 100 mg BID. Continues on bowel regimen of Miralax, Metamucil and Senekot.  She has significant hypophonia, neurogenic bladder with indwelling Foley catheter. She is tolerating regular diet with thin liquids and taking medications crushed in applesauce.  She had limited mobility and uses a power wheelchair. Sleeps in chair or on couch. She can drive WC into handicap shower and transfers to shower bench. She uses a portable BSC transfers for bowel program. She has a vertical power lift in her garage to from from power WC to car. She uses a manual WC in the community for appointments. He husband lifts her onto couch, into car, etc as she has not stood for many months. The patient requires inpatient physical medicine and rehabilitation evaluations and treatment secondary to dysfunction due to secondary progressive multiple sclerosis.   She follows with Dr. Arlice Colt, [facility]. Last [place] in 2021.   Pt reports spasticity has been getting worse- 6 months ago, her strength was much better- was able to transfer in/out of a shower; walk ~ 20-30 ft with RW, and transfer without help- not needs almost max-total A to transfer; hasn't been able to get in shower, needs foley for toileting.  Had been put on  nectar thick liquids when in hospital last- stopped, because swallowing "felt better" and hated them.   LBM this AM.  On Flagyl for tooth abscess.  Had Stage I-II on buttocks from latest hospitalization at [facility].  Catheter changed 10/5- was yanked accidentally in last 1 week, but not bleeding anymore.    ROS     Past Medical History:  Diagnosis Date   Allergies     Anxiety     Arthritis     Asthma      seasonal - pollen   Constipation     Depression     Dyspnea      with exertion   GERD (gastroesophageal reflux disease)     Headache     History of hiatal hernia     Left radial nerve palsy 03/15/2020    resolved - Left arm/hand weak   Multiple sclerosis ([place])     Pneumonia 2007    x 1   Vision abnormalities     Wears glasses           Past Surgical History:  Procedure Laterality Date   COLONOSCOPY       EXCISION MORTON'S NEUROMA Right     fallopian tube removal       INTRATHECAL PUMP IMPLANT Right 02/18/2019    Procedure: INTRATHECAL PUMP IMPLANT;  Surgeon: Clydell Hakim, MD;  Location: [place];  Service: Neurosurgery;  Laterality: Right;  INTRATHECAL PUMP IMPLANT   INTRATHECAL PUMP IMPLANT Right 02/17/2021    Procedure: Baclofen Pump Replacement;  Surgeon: Erline Levine, MD;  Location: [place];  Service: Neurosurgery;  Laterality: Right;  3C/RM 21   INTRATHECAL PUMP REVISION N/A 01/11/2022    Procedure: Baclofen Pump revision;  Surgeon: Dawley, Theodoro Doing, DO;  Location: [place];  Service: Neurosurgery;  Laterality: N/A;  RM 21 to follow   PAIN PUMP IMPLANTATION N/A 09/28/2021    Procedure: Insertion of baclofen pump, right lower quadrant;  Surgeon: Dawley, Theodoro Doing, DO;  Location: [place];  Service: Neurosurgery;  Laterality: N/A;   PAIN PUMP REMOVAL Left 04/06/2021    Procedure: Removal of baclofen pump, Left;  Surgeon: Dawley, Theodoro Doing, DO;  Location: [place];  Service: Neurosurgery;  Laterality: Left;  Lateral/Left//3C rm 19   UPPER GI ENDOSCOPY       WISDOM TOOTH EXTRACTION        WISDOM TOOTH EXTRACTION             Family History  Problem Relation Age of Onset   Diabetes Mother     Healthy Brother      Social History:  reports that she has been smoking cigarettes. She started smoking about 35 years ago. She has a 16.00 pack-year smoking history. She has never used smokeless tobacco. She reports that she does not currently use alcohol. She reports that she does not use drugs. Allergies:       Allergies  Allergen Reactions   Ziconotide Acetate Shortness Of Breath      Anorexia, weird sensations, could not talk, choking feeling, lost since of taste and smell. Hallucinations, vivid dreams. Confusion and sleepiness. N/v, increase in pain.  Anorexia, weird sensations, could not talk, choking feeling, lost since of taste and smell. Hallucinations, vivid dreams. Confusion and sleepiness. N/v, increase in pain.    Morphine Other (See Comments)      Pt does not recall reaction    Amoxicillin Nausea And Vomiting   Lyrica [Pregabalin] Nausea And Vomiting    (Not in a hospital admission)         Home:   Functional History:   Functional Status:  Mobility:   ADL:   Cognition:   Physical Exam: There were no vitals taken for this visit. Physical Exam  Patient is awake, alert, sitting up in manual w/c- husband at bedside; leaning dramatically to left, which husband says is new, NAD HEENT: almost aphonic with dysarthria- very hard to understand CV; RRR-  Pulmonary: coarse breath sounds throughout- a few rhonchi- adequate air movement GI: soft, NT, ND,  (+) BS  GU: leg bag from foley almost full Extremities: 2+ RLE ankle/lower calf/foot swelling- pitting MS: RUE 5-/5 except FA 4+/5 LUE 4-/5 throughout RLE- 4-/5;  LLE- HF 1/5; KE 2-/5; otherwise 1/5 Neuro: Low tone LUE RUE- normal tone RLE MAS of 2-3 with significant extensor tone with simple ROM LLE MAS 1+ No hoffman's B/L Few beats clonus on LLE but 5-7 beats RLE Psych: interactive; slightly flat  affect; slightly depressed   Lab Results Last 48 Hours  No results found for this or any previous visit (from the past 48 hour(s)).   Imaging Results (Last 48 hours)  No results found.         There were no vitals taken for this visit.   Medical Problem List and Plan: 1. Functional deficits secondary to secondary progressive multiple sclerosis with spasticity and uncontrolled pain- and need to titration up of ITB pump - pt's level of function has been declining for last few months- and now is max-total A             -  patient may  shower             -ELOS/Goals: min Assist 14-18 days 2.  Antithrombotics: -DVT/anticoagulation:  Pharmaceutical: Lovenox             -antiplatelet therapy: none 3. Pain/spasticity management:  -baclofen IT pump- will titrate while here             -baclofen 20 mg  -oxycodone 15 mg - will increase to q4 hours prn for therapy.  -dantrolene 50 mg -tizanidine 4 mg 4. Mood/Behavior/Sleep: LCSW to evaluate and provide emotional support             -continue Wellbutrin XL 300 mg daily             -continue Lamictal 25 mg 2 tabs BID             -antipsychotic agents: n/a 5. Neuropsych/cognition: This patient is capable of making decisions on her own behalf. 6. Skin/Wound Care: Routine skin care checks 7. Fluids/Electrolytes/Nutrition: Routine Is and Os and follow-up chemistries             -continue vitamin supplements (C, D3, B12) 8: GERD: continue Zantac 9: Neurogenic bladder: continue Foley catheter- last changed 03/01/22- will need to be changed prior to d/c.  10: Bowel program: continue psyllium, Miralax, Senekot 11: Edema: continue Lasix 40 mg daily 12: Season allergies: continue Afrin PRN, Singulair daily 13: Post-menopausal: continue Premarin vaginal cream  14. Dysphagia/resp issues- will get CXR and Slp eval for swallowing issues- had been on Nectar thick liquids prior.   15. Tooth abscess- con't Flagyl- finish Friday 250 mg TID.        Barbie Banner, PA-C 03/13/2022           I have personally performed a face to face diagnostic evaluation of this patient and formulated the key components of the plan.  Additionally, I have personally reviewed laboratory data, imaging studies, as well as relevant notes and concur with the physician assistant's documentation above.   The patient's status has not changed from the original H&P.  Any changes in documentation from the acute care chart have been noted above.

## 2022-03-13 NOTE — Progress Notes (Unsigned)
Physical Medicine and Rehabilitation Admission H&P    CC: Functional deficits secondary to secondary progressive multiple sclerosis  HPI: Sharon Odom is a 51 year old female with a history of secondary progressive multiple sclerosis. She has had significant left greater than right spasticity with subsequent implantation of intrathecal pump for baclofen administration. She underwent revision of the abdominal portion of the pump by Dr. Jake Samples on 01/11/2022. At the time of follow up on 10/6, she reported worsening pain especially when sitting despite increase in 5% increase in baclofen pump. She has been taking oral baclofen 20 mg six/day, oxycodone 10 mg six/day and on Dantrolene 100 mg BID. Continues on bowel regimen of Miralax, Metamucil and Senekot.  She has significant hypophonia, neurogenic bladder with indwelling Foley catheter. She is tolerating regular diet with thin liquids and taking medications crushed in applesauce.  She had limited mobility and uses a power wheelchair. Sleeps in chair or on couch. She can drive WC into handicap shower and transfers to shower bench. She uses a portable BSC transfers for bowel program. She has a vertical power lift in her garage to from from power WC to car. She uses a manual WC in the community for appointments. He husband lifts her onto couch, into car, etc as she has not stood for many months. The patient requires inpatient physical medicine and rehabilitation evaluations and treatment secondary to dysfunction due to secondary progressive multiple sclerosis.  She follows with Dr. Despina Arias, [facility]. Last [place] in 2021.  ROS Past Medical History:  Diagnosis Date   Allergies    Anxiety    Arthritis    Asthma    seasonal - pollen   Constipation    Depression    Dyspnea    with exertion   GERD (gastroesophageal reflux disease)    Headache    History of hiatal hernia    Left radial nerve palsy 03/15/2020   resolved  - Left arm/hand weak   Multiple sclerosis ([facility])    Pneumonia 2007   x 1   Vision abnormalities    Wears glasses    Past Surgical History:  Procedure Laterality Date   COLONOSCOPY     EXCISION MORTON'S NEUROMA Right    fallopian tube removal     INTRATHECAL PUMP IMPLANT Right 02/18/2019   Procedure: INTRATHECAL PUMP IMPLANT;  Surgeon: Odette Fraction, MD;  [facility];  Service: Neurosurgery;  Laterality: Right;  INTRATHECAL PUMP IMPLANT   INTRATHECAL PUMP IMPLANT Right 02/17/2021   Procedure: Baclofen Pump Replacement;  Surgeon: Maeola Harman, MD;  Location: [facility] OR;  Service: Neurosurgery;  Laterality: Right;  3C/RM 21   INTRATHECAL PUMP REVISION N/A 01/11/2022   Procedure: Baclofen Pump revision;  Surgeon: Dawley, Alan Mulder, DO;  Location: [facility] OR;  Service: Neurosurgery;  Laterality: N/A;  RM 21 to follow   PAIN PUMP IMPLANTATION N/A 09/28/2021   Procedure: Insertion of baclofen pump, right lower quadrant;  Surgeon: Dawley, Alan Mulder, DO;  Location: [facility] OR;  Service: Neurosurgery;  Laterality: N/A;   PAIN PUMP REMOVAL Left 04/06/2021   Procedure: Removal of baclofen pump, Left;  Surgeon: Dawley, Alan Mulder, DO;  Location: [facility] OR;  Service: Neurosurgery;  Laterality: Left;  Lateral/Left//3C rm 19   UPPER GI ENDOSCOPY     WISDOM TOOTH EXTRACTION     WISDOM TOOTH EXTRACTION     Family History  Problem Relation Age of Onset   Diabetes Mother    Healthy Brother    Social  History:  reports that she has been smoking cigarettes. She started smoking about 35 years ago. She has a 16.00 pack-year smoking history. She has never used smokeless tobacco. She reports that she does not currently use alcohol. She reports that she does not use drugs. Allergies:  Allergies  Allergen Reactions   Ziconotide Acetate Shortness Of Breath    Anorexia, weird sensations, could not talk, choking feeling, lost since of taste and smell. Hallucinations, vivid dreams. Confusion and sleepiness. N/v, increase in pain.   Anorexia, weird sensations, could not talk, choking feeling, lost since of taste and smell. Hallucinations, vivid dreams. Confusion and sleepiness. N/v, increase in pain.    Morphine Other (See Comments)    Pt does not recall reaction    Amoxicillin Nausea And Vomiting   Lyrica [Pregabalin] Nausea And Vomiting   (Not in a hospital admission)     Home:     Functional History:    Functional Status:  Mobility:          ADL:    Cognition:      Physical Exam: There were no vitals taken for this visit. Physical Exam  No results found for this or any previous visit (from the past 48 hour(s)). No results found.    There were no vitals taken for this visit.  Medical Problem List and Plan: 1. Functional deficits secondary to secondary progressive multiple sclerosis with spasticity and uncontrolled pain  -patient may *** shower  -ELOS/Goals: *** 2.  Antithrombotics: -DVT/anticoagulation:  Pharmaceutical: Lovenox  -antiplatelet therapy: none 3. Pain/spasticity management:  -baclofen IT pump  -baclofen 20 mg  -oxycodone 10 mg -dantrolene 50 mg -tizanidine 4 mg 4. Mood/Behavior/Sleep: LCSW to evaluate and provide emotional support  -continue Wellbutrin XL 300 mg daily  -continue Lamictal 25 mg 2 tabs BID  -antipsychotic agents: n/a 5. Neuropsych/cognition: This patient is capable of making decisions on her own behalf. 6. Skin/Wound Care: Routine skin care checks 7. Fluids/Electrolytes/Nutrition: Routine Is and Os and follow-up chemistries  -continue vitamin supplements (C, D3, B12) 8: GERD: continue Zantac 9: Neurogenic bladder: continue Foley catheter 10: Bowel program: continue psyllium, Miralax, Senekot 11: Edema: continue Lasix 40 mg daily 12: Season allergies: continue Afrin PRN, Singulair daily 13: Post-menopausal: continue Premarin vaginal cream    ***  Barbie Banner, PA-C 03/13/2022

## 2022-03-13 NOTE — Progress Notes (Signed)
Cristina Gong, RN Rehab Admission Coordinator Physical Medicine and Rehabilitation PMR Pre-admission Signed Encounter Date:  03/09/2022  Related encounter: Documentation from 03/09/2022 in [place]      PMR Admission Coordinator Pre-Admission Assessment   Patient: Sharon Odom is an 51 y.o., female MRN: [id number] DOB: 1970-10-07 Height: 5' 9" (1.753 m) Weight: 60.2 kg   Insurance Information HMO:     PPO:yes      PCP:      IPA:      80/20:      OTHER: self funded HDHP PRIMARY: Associate Professor, [employer]      Policy#: [id number]      Subscriber: spouse CM Name: faxed approval from Care management approved 10/16 through 03/19/22      Phone#: [phone]     Fax#: [phone] Pre-Cert#: [id number] Z approved 10/16 until 10/23      Employer: [employer] Benefits:  Phone #: [phone]     Name: 02/26/22 Eff. Date: 05/28/2016     Deduct: $4000(met)      Out of Pocket Max: $8000 [phone] met)      Life Max: none CIR: 80%      SNF: 80% 130 days Outpatient: 80%     Co-Pay: 20% Home Health: 80%      Co-Pay: 130 combined visits ( 74 remaining) DME: 80%     Co-Pay: 20% Providers: in network  SECONDARY: none   Financial Counselor:       Phone#:    The Engineer, [employer]" for patients in Inpatient Rehabilitation Facilities with attached "Privacy Act [place] Records" was provided and verbally reviewed with: N/A   Emergency Contact Information Contact Information       Name Relation Home Work Mobile    [place] Spouse     [phone]    Oretha, Weismann Relative     [phone]         Current Medical History  Patient Admitting Diagnosis: progressive MS with spasticity   History of Present Illness: 51 year old female with history of 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 Initial MS diagnosis 2013. ITB pump removed 04/06/21 due to  pump infection. Revised pump 01/11/22.     Presented on 03/13/22 from home to fine tune ITB pump that can not be done in visits to outpatient clinic. Also in need of intensive rehabilitation to improve her over all level of function.    Currently drinking regular liquids as no longer in need of nectar thick liquids. Does take pills with applesauce. Has foley indwelling catheter. Continent of bowel but spouse reports BM schedule usually every two weeks and is continent. On miralax, metamucil and senokot. Chronic pain with spasticity issues. Home regimen of Valium, Zabnaflex, and baclofen. Oxycodone reportedly gives little relief. On lamictal for nerve pain. Recently began Dantrolene. Outpatient follow up with Dr Melene [facility]. Last [facility] January 2021.    Patient's medical record from outpatient clinic with Dr Dagoberto Ligas and Dr Felecia Shelling as well as home health with [facility] has been reviewed by the rehabilitation admission coordinator and physician.   Past Medical History      Past Medical History:  Diagnosis Date   Allergies     Anxiety     Arthritis     Asthma      seasonal - pollen   Constipation     Depression     Dyspnea  with exertion   GERD (gastroesophageal reflux disease)     Headache     History of hiatal hernia     Left radial nerve palsy 03/15/2020    resolved - Left arm/hand weak   Multiple sclerosis ([place])     Pneumonia 2007    x 1   Vision abnormalities     Wears glasses      Has the patient had major surgery during 100 days prior to admission? Yes   Family History   family history includes Diabetes in her mother; Healthy in her brother.   Current Medications   Current Outpatient Medications:    acetaminophen (TYLENOL) 500 MG tablet, Take 1,000 mg by mouth every 6 (six) hours as needed for moderate pain., Disp: , Rfl:    albuterol (VENTOLIN HFA) 108 (90 Base) MCG/ACT inhaler, Inhale 1-2 puffs into the lungs every 6 (six) hours as needed for wheezing or  shortness of breath., Disp: 6.7 g, Rfl: 1   Ascorbic Acid (VITAMIN C) 1000 MG tablet, Take 1,000 mg by mouth daily., Disp: , Rfl:    baclofen (LIORESAL) 20 MG tablet, Take 2 tablets (40 mg total) by mouth 4 (four) times daily. Since ITB pump out (Patient taking differently: Take 20 mg by mouth 3 (three) times daily. Since ITB pump out), Disp: 720 tablet, Rfl: 3   buPROPion (WELLBUTRIN XL) 300 MG 24 hr tablet, TAKE 1 TABLET DAILY (Patient taking differently: Take 300 mg by mouth daily.), Disp: 90 tablet, Rfl: 3   carboxymethylcellulose (REFRESH PLUS) 0.5 % SOLN, Place 1 drop into both eyes 3 (three) times daily as needed (dry eyes)., Disp: , Rfl:    Cholecalciferol (VITAMIN D-3) 125 MCG (5000 UT) TABS, Take 5,000 Units by mouth daily., Disp: 30 tablet, Rfl: 1   conjugated estrogens (PREMARIN) vaginal cream, Place 1 Application vaginally 3 (three) times a week. Blueberry size amount for application, Disp: , Rfl:    Cyanocobalamin (VITAMIN B-12) 2500 MCG SUBL, Place 1 tablet (2,500 mcg total) under the tongue daily., Disp: 30 tablet, Rfl: 0   dalfampridine 10 MG TB12, Take 1 tablet (10 mg total) by mouth 2 (two) times daily. One po bid, Disp: 180 tablet, Rfl: 3   dantrolene (DANTRIUM) 50 MG capsule, Take 1 capsule (50 mg total) by mouth at bedtime. X 1 week, then 50 mg 2x/day x  1week, then 50 mg TID x 1 week, then 100 mg 2x/day- for spasticity- con't all other spasticity meds, Disp: 120 capsule, Rfl: 5   diazepam (VALIUM) 5 MG tablet, Take 1.5 tablets (7.5 mg total) by mouth every 8 (eight) hours., Disp: 140 tablet, Rfl: 5   furosemide (LASIX) 40 MG tablet, Take 40 mg by mouth daily., Disp: , Rfl:    HYDROmorphone (DILAUDID) 4 MG tablet, Take 1 tablet (4 mg total) by mouth every 6 (six) hours as needed for severe pain. For chronic pain due to MS- to take the place of Oxycodone, Disp: 120 tablet, Rfl: 0   lamoTRIgine (LAMICTAL) 25 MG tablet, Take one pill daily x 5 days, then one pill twice daily x 5 days,  then one pill three times daily x 5 days, then 2 pills twice daily, Disp: 120 tablet, Rfl: 5   montelukast (SINGULAIR) 10 MG tablet, TAKE 1 TABLET DAILY (Patient taking differently: Take 10 mg by mouth daily after breakfast.), Disp: 90 tablet, Rfl: 3   Oxycodone HCl 10 MG TABS, Take 1 tablet (10 mg total) by mouth every 4 (four) hours  as needed., Disp: 120 tablet, Rfl: 0   oxymetazoline (AFRIN) 0.05 % nasal spray, Place 1 spray into both nostrils 2 (two) times daily as needed for congestion., Disp: , Rfl:    polyethylene glycol (MIRALAX / GLYCOLAX) 17 g packet, Take 17 g by mouth daily. Takes BID sometimes, Disp: , Rfl:    Psyllium (METAMUCIL PO), Take 1 Dose by mouth daily., Disp: , Rfl:    raNITIdine HCl (ZANTAC PO), Take by mouth., Disp: , Rfl:    senna-docusate (SENOKOT-S) 8.6-50 MG tablet, Take 1 tablet by mouth daily. (Patient taking differently: Take 2 tablets by mouth daily.), Disp: 30 tablet, Rfl: 0   tiZANidine (ZANAFLEX) 4 MG tablet, TAKE 2 TABLETS THREE TIMES A DAY (Patient taking differently: Take 8 mg by mouth 3 (three) times daily.), Disp: 720 tablet, Rfl: 3   trospium (SANCTURA) 20 MG tablet, Take 20 mg by mouth 2 (two) times daily., Disp: , Rfl:    Patients Current Diet: Diet Regular diet with thin liquids. Meds with applesauce   Precautions / Restrictions Precautions: Fall Weight Bearing Restrictions: No   Has the patient had 2 or more falls or a fall with injury in the past year? No   Prior Activity Level Household: uses power wheelchair, limited mobility, transfers only Uses power wheelchair with all activity. Sleeps in chair or on couch. Can drive wheelchair into handicapped shower and transfers to shower bench. Uses portable BSC transfers for bowel program. Has vertical power lift in garage to go from power wheelchair to car. Uses manuel wheelchair in the community for appointments. Corene Cornea with [facility] to transport power wheelchair to CIR on day of admission 03/13/22.  Spouse lifts her onto couch, into car, etc as she has not stood safely for months.   Prior Functional Level Self Care: Did the patient need help bathing, dressing, using the toilet or eating? Needed some help   Indoor Mobility: Did the patient need assistance with walking from room to room (with or without device)? Dependent   Stairs: Did the patient need assistance with internal or external stairs (with or without device)? Dependent   Functional Cognition: Did the patient need help planning regular tasks such as shopping or remembering to take medications? Independent   Patient Information Are you of Hispanic, Latino/a,or Spanish origin?: A. No, not of Hispanic, Latino/a, or Spanish origin What is your race?: A. White Do you need or want an interpreter to communicate with a doctor or health care staff?: 0. No   Patient's Response To:  Health Literacy and Transportation Is the patient able to respond to health literacy and transportation needs?: Yes Health Literacy - How often do you need to have someone help you when you read instructions, pamphlets, or other written material from your doctor or pharmacy?: Never In the past 12 months, has lack of transportation kept you from medical appointments or from getting medications?: No In the past 12 months, has lack of transportation kept you from meetings, work, or from getting things needed for daily living?: No   Home Assistive Devices / Equipment Other (Comment) (power wheelchair with [employer])     Prior Device Use: Indicate devices/aids used by the patient prior to current illness, exacerbation or injury? Manual wheelchair, Motorized wheelchair or scooter, and Mechanical lift     Prior Functional Level Current Functional Level  Bed Mobility   Min assist Min assist    Transfers   Mod assist   Mod assist    Mobility -  Walk/Wheelchair   Total assist   Total assist    Upper Body Dressing   Min assist   Min assist     Lower Body Dressing   Max assist   Max assist    Grooming   Min assist   Min assist    Eating/Drinking   Mod Independent   Mod Independent    Toilet Transfer   Mod assist   Mod assist    Bladder Continence    Foley catheter   Foley catheter    Bowel Management   continent   continent    Stair Climbing   Other   Other    Communication   chronic dysarthria   chronic dysarthria    Memory   intact intact      Special Needs/ Care Considerations Indwelling catheter chronically.Typically sleeps in her power wheelchair or on couch to sleep.    Previous Home Environment  Living Arrangements: Spouse/significant other  Lives With: Spouse Available Help at Discharge: Family; Available PRN/intermittently (spouse works) Type of Home: [employer] Layout: Two level; Able to live on main level with bedroom/bathroom Alternate Level Stairs-Number of Steps: Flight Home Access: Other (comment) Bathroom Shower/Tub: Multimedia programmer: Handicapped height Bathroom Accessibility: Yes How Accessible: Accessible via wheelchair; Accessible via walker [place]: Yes Type of Home Care Services: [place] (if known): [facility] Additional Comments: receiving HH PT only   Discharge Living Setting Plans for Discharge Living Setting: Patient's home; Lives with (comment) (spouse) Type of Home at Discharge: House Discharge Home Layout: Two level; Able to live on main level with bedroom/bathroom Alternate Level Stairs-Number of Steps: flight Discharge Home Access: [place] entrance Discharge Bathroom Shower/Tub: Walk-in shower Discharge Bathroom Toilet: Handicapped height Discharge Bathroom Accessibility: Yes How Accessible: Accessible via wheelchair; Accessible via walker Does the patient have any problems obtaining your medications?: No   Social/Family/Support Systems Patient Roles: Spouse Contact Information: spouse, Shanon Brow Anticipated Caregiver:  Shanon Brow Anticipated Caregiver's Contact Information: [phone] Ability/Limitations of Caregiver: works day shift Caregiver Availability: Evenings only Discharge Plan Discussed with Primary Caregiver: Yes Is Caregiver In Agreement with Plan?: Yes Does Caregiver/Family have Issues with Lodging/Transportation while Pt is in Rehab?: No   Goals Patient/Family Goal for Rehab: Mod I transfers to wheelchair, Mod I wheelchair level with PT and OT, Mod I SLP; supervision short distance mobility with Rollator Expected length of stay: ELOS 7 to 10 days Pt/Family Agrees to Admission and willing to participate: Yes Program Orientation Provided & Reviewed with Pt/Caregiver Including Roles  & Responsibilities: Yes  Barriers to Discharge: Decreased caregiver support   Decrease burden of Care through IP rehab admission: n/a   Possible need for SNF placement upon discharge: not anticipated   Patient Condition: I have reviewed medical records from [facility] clinic with Dr Dagoberto Ligas and Dr Felecia Shelling as well as [facility] health PT asessments, spoken patient and spouse. I discussed via phone for inpatient rehabilitation assessment.  Patient will benefit from ongoing PT, OT, and SLP, can actively participate in 3 hours of therapy a day 5 days of the week, and can make measurable gains during the admission.  Patient will also benefit from the coordinated team approach during an Inpatient Acute Rehabilitation admission.  The patient will receive intensive therapy as well as Rehabilitation physician, nursing, social worker, and care management interventions.  Due to bladder management, bowel management, safety, skin/wound care, disease management, medication administration, pain management, and patient education the patient requires 24 hour a day  rehabilitation nursing.  The patient is currently mod to max asisst at wheelchair level with mobility and basic ADLs.  Discharge setting and therapy post discharge at home with home  health is anticipated.  Patient has agreed to participate in the Acute Inpatient Rehabilitation Program and will admit today.   Preadmission Screen Completed By:  Cleatrice Burke, 03/13/2022 8:32 AM ______________________________________________________________________   Discussed status with Dr. Dagoberto Ligas on 03/13/22 at 0830 and received approval for admission today.   Admission Coordinator:  Cleatrice Burke, RN, time 2094 Date 03/13/22    Assessment/Plan: Diagnosis: Does the need for close, 24 hr/day Medical supervision in concert with the patient's rehab needs make it unreasonable for this patient to be served in a less intensive setting? Yes Co-Morbidities requiring supervision/potential complications: ITB titration, MS; and W/C dependent; decline in function in last 1-2 months Due to bladder management, bowel management, safety, skin/wound care, disease management, medication administration, pain management, and patient education, does the patient require 24 hr/day rehab nursing? Yes Does the patient require coordinated care of a physician, rehab nurse, PT, OT, and SLP to address physical and functional deficits in the context of the above medical diagnosis(es)? Yes Addressing deficits in the following areas: balance, endurance, locomotion, strength, transferring, bowel/bladder control, bathing, dressing, feeding, grooming, and toileting Can the patient actively participate in an intensive therapy program of at least 3 hrs of therapy 5 days a week? Yes The potential for patient to make measurable gains while on inpatient rehab is good and fair Anticipated functional outcomes upon discharge from inpatient rehab: supervision PT, supervision OT, n/a SLP Estimated rehab length of stay to reach the above functional goals is: 14-18 days Anticipated discharge destination: Home 10. Overall Rehab/Functional Prognosis: good and fair   MD Signature          Cosigned by: Courtney Heys, MD at 03/13/2022  9:51 AM   Revision History                                     Note Details  Author Cristina Gong, RN File Time 03/13/2022  8:56 AM  Author Type Rehab Admission Coordinator Status Signed  Last Editor Courtney Heys, MD Service Physical Medicine and [place] # [id number] Admit Date 03/13/2022

## 2022-03-14 ENCOUNTER — Encounter (HOSPITAL_COMMUNITY): Payer: Self-pay | Admitting: Physical Medicine and Rehabilitation

## 2022-03-14 ENCOUNTER — Other Ambulatory Visit: Payer: Self-pay

## 2022-03-14 DIAGNOSIS — G35 Multiple sclerosis: Secondary | ICD-10-CM | POA: Diagnosis not present

## 2022-03-14 MED ORDER — CALCIUM CARBONATE ANTACID 500 MG PO CHEW
1.0000 | CHEWABLE_TABLET | Freq: Three times a day (TID) | ORAL | Status: DC | PRN
  Administered 2022-03-19: 200 mg via ORAL
  Filled 2022-03-14: qty 1

## 2022-03-14 MED ORDER — TROSPIUM CHLORIDE 20 MG PO TABS
20.0000 mg | ORAL_TABLET | ORAL | Status: DC
  Administered 2022-03-15 – 2022-03-27 (×25): 20 mg via ORAL
  Filled 2022-03-14 (×26): qty 1

## 2022-03-14 MED ORDER — PSYLLIUM 0.36 G PO CAPS
5.0000 | ORAL_CAPSULE | Freq: Every day | ORAL | Status: DC
  Administered 2022-03-15 – 2022-03-27 (×13): 5 via ORAL
  Filled 2022-03-14 (×14): qty 5

## 2022-03-14 MED ORDER — BACLOFEN 10 MG PO TABS
20.0000 mg | ORAL_TABLET | Freq: Every day | ORAL | Status: DC | PRN
  Administered 2022-03-14: 20 mg via ORAL
  Filled 2022-03-14: qty 2

## 2022-03-14 MED ORDER — NON FORMULARY: 20.0000 mg | Freq: Two times a day (BID) | Status: DC

## 2022-03-14 MED ORDER — PSYLLIUM 95 % PO PACK
1.0000 | PACK | Freq: Every day | ORAL | Status: DC
  Filled 2022-03-14: qty 1

## 2022-03-14 MED ORDER — POTASSIUM CHLORIDE CRYS ER 20 MEQ PO TBCR
40.0000 meq | EXTENDED_RELEASE_TABLET | Freq: Once | ORAL | Status: AC
  Administered 2022-03-14: 40 meq via ORAL
  Filled 2022-03-14: qty 2

## 2022-03-14 MED ORDER — CHLORHEXIDINE GLUCONATE CLOTH 2 % EX PADS
6.0000 | MEDICATED_PAD | Freq: Two times a day (BID) | CUTANEOUS | Status: DC
  Administered 2022-03-14 – 2022-03-27 (×26): 6 via TOPICAL

## 2022-03-14 MED ORDER — POTASSIUM CHLORIDE CRYS ER 10 MEQ PO TBCR
10.0000 meq | EXTENDED_RELEASE_TABLET | Freq: Every day | ORAL | Status: DC
  Administered 2022-03-14 – 2022-03-20 (×7): 10 meq via ORAL
  Filled 2022-03-14 (×7): qty 1

## 2022-03-14 MED ORDER — DALFAMPRIDINE ER 10 MG PO TB12
10.0000 mg | ORAL_TABLET | Freq: Two times a day (BID) | ORAL | Status: DC
  Administered 2022-03-14 – 2022-03-27 (×26): 10 mg via ORAL
  Filled 2022-03-14 (×26): qty 1

## 2022-03-14 MED ORDER — METRONIDAZOLE 500 MG PO TABS
250.0000 mg | ORAL_TABLET | Freq: Three times a day (TID) | ORAL | Status: DC
  Filled 2022-03-14: qty 0.5

## 2022-03-14 MED ORDER — NAPROXEN 250 MG PO TABS
500.0000 mg | ORAL_TABLET | Freq: Two times a day (BID) | ORAL | Status: DC
  Administered 2022-03-14 – 2022-03-27 (×26): 500 mg via ORAL
  Filled 2022-03-14 (×28): qty 2

## 2022-03-14 MED ORDER — METRONIDAZOLE 250 MG PO TABS
250.0000 mg | ORAL_TABLET | Freq: Two times a day (BID) | ORAL | Status: DC
  Administered 2022-03-14 – 2022-03-15 (×2): 250 mg via ORAL
  Filled 2022-03-14 (×2): qty 1

## 2022-03-14 NOTE — Progress Notes (Signed)
Physical Therapy Assessment and Plan  Patient Details  Name: Sharon Odom MRN: [id number] Date of Birth: 1971-03-07  PT Diagnosis: Coordination disorder, Difficulty walking, Hypertonia, Impaired sensation, and Muscle weakness Rehab Potential: Fair ELOS: 7-10 days   Today's Date: 03/14/2022 PT Individual Time: 0900-1021 PT Individual Time Calculation (min): 96 min    Hospital Problem: Principal Problem:   Multiple sclerosis ([place]) Active Problems:   S/P insertion of intrathecal pump   Past Medical History:  Past Medical History:  Diagnosis Date   Allergies    Anxiety    Arthritis    Asthma    seasonal - pollen   Constipation    Depression    Dyspnea    with exertion   GERD (gastroesophageal reflux disease)    Headache    History of hiatal hernia    Left radial nerve palsy 03/15/2020   resolved - Left arm/hand weak   Multiple sclerosis ([place])    Pneumonia 2007   x 1   Vision abnormalities    Wears glasses    Past Surgical History:  Past Surgical History:  Procedure Laterality Date   COLONOSCOPY     EXCISION MORTON'S NEUROMA Right    fallopian tube removal     INTRATHECAL PUMP IMPLANT Right 02/18/2019   Procedure: INTRATHECAL PUMP IMPLANT;  Surgeon: Clydell Hakim, MD;  Location: [facility];  Service: Neurosurgery;  Laterality: Right;  INTRATHECAL PUMP IMPLANT   INTRATHECAL PUMP IMPLANT Right 02/17/2021   Procedure: Baclofen Pump Replacement;  Surgeon: Erline Levine, MD;  Location: [facility];  Service: Neurosurgery;  Laterality: Right;  3C/RM 21   INTRATHECAL PUMP REVISION N/A 01/11/2022   Procedure: Baclofen Pump revision;  Surgeon: Dawley, Theodoro Doing, DO;  Location: [facility];  Service: Neurosurgery;  Laterality: N/A;  RM 21 to follow   PAIN PUMP IMPLANTATION N/A 09/28/2021   Procedure: Insertion of baclofen pump, right lower quadrant;  Surgeon: Dawley, Theodoro Doing, DO;  Location: [facility];  Service: Neurosurgery;  Laterality: N/A;   PAIN PUMP REMOVAL Left 04/06/2021   Procedure: Removal  of baclofen pump, Left;  Surgeon: Dawley, Theodoro Doing, DO;  Location: [facility];  Service: Neurosurgery;  Laterality: Left;  Lateral/Left//3C rm 19   UPPER GI ENDOSCOPY     WISDOM TOOTH EXTRACTION     WISDOM TOOTH EXTRACTION      Assessment & Plan Clinical Impression: Patient is a 51 y.o. year old female with a history of secondary progressive multiple sclerosis. She has had significant left greater than right spasticity with subsequent implantation of intrathecal pump for baclofen administration. She underwent revision of the abdominal portion of the pump by Dr. Reatha Armour on 01/11/2022. At the time of follow up on 10/6, she reported worsening pain especially when sitting despite increase in 5% increase in baclofen pump. She has been taking oral baclofen 20 mg six/day, oxycodone 10 mg six/day and on Dantrolene 100 mg BID. Continues on bowel regimen of Miralax, Metamucil and [place].  She has significant hypophonia, neurogenic bladder with indwelling Foley catheter. She is tolerating regular diet with thin liquids and taking medications crushed in applesauce.  She had limited mobility and uses a power wheelchair. Sleeps in chair or on couch. She can drive WC into handicap shower and transfers to shower bench. She uses a portable BSC transfers for bowel program. She has a vertical power lift in her garage to from from power WC to car. She uses a manual WC in the community for appointments. He husband lifts her onto couch,  into car, etc as she has not stood for many months. The patient requires inpatient physical medicine and rehabilitation evaluations and treatment secondary to dysfunction due to secondary progressive multiple sclerosis.   Patient transferred to CIR on 03/13/2022 .   Patient currently requires mod with mobility secondary to muscle weakness and muscle joint tightness, impaired timing and sequencing, abnormal tone, unbalanced muscle activation, decreased coordination, and decreased motor planning, and  decreased standing balance, decreased postural control, and decreased balance strategies.  Prior to hospitalization, patient was max with mobility and lived with Spouse in a House home.  Home access is  Other (comment) (elevator (vertical platform lift) access from ground floor to main level).  Patient will benefit from skilled PT intervention to maximize safe functional mobility, minimize fall risk, and decrease caregiver burden for planned discharge home with intermittent assist.  Anticipate patient will benefit from follow up [facility] at discharge.  PT - End of Session Activity Tolerance: Tolerates 30+ min activity with multiple rests Endurance Deficit: Yes Endurance Deficit Description: decreased PT Assessment Rehab Potential (ACUTE/IP ONLY): Fair PT Barriers to Discharge: Neurogenic Bowel & Bladder;Decreased caregiver support;Lack of/limited family support PT Barriers to Discharge Comments: decreased caregiver support as spouse works during day PT Patient demonstrates impairments in the following area(s): Balance;Edema;Endurance;Motor;Nutrition;Pain;Sensory PT Transfers Functional Problem(s): Bed Mobility;Bed to Chair;Car PT Locomotion Functional Problem(s): Ambulation;Wheelchair Mobility PT Plan PT Intensity: Minimum of 1-2 x/day ,45 to 90 minutes PT Frequency: 5 out of 7 days PT Duration Estimated Length of Stay: 7-10 days PT Treatment/Interventions: Ambulation/gait training;Discharge planning;Functional mobility training;Psychosocial support;Therapeutic Activities;Balance/vestibular training;Disease management/prevention;Neuromuscular re-education;Therapeutic Exercise;Wheelchair propulsion/positioning;DME/adaptive equipment instruction;Pain management;UE/LE Strength taining/ROM;Community reintegration;Patient/family education;UE/LE Coordination activities PT Transfers Anticipated Outcome(s): Min A PT Locomotion Anticipated Outcome(s): Mod A PT Recommendation Recommendations for Other  Services: Therapeutic Recreation consult Therapeutic Recreation Interventions: Stress management Follow Up Recommendations: Home health PT Patient destination: Home Equipment Recommended: To be determined Equipment Details: pt owns PWC, manual w/c, BSC, rollator   PT Evaluation Precautions/Restrictions Precautions Precautions: Fall Restrictions Weight Bearing Restrictions: No Pain Interference Pain Interference Pain Effect on Sleep: 3. Frequently Pain Interference with Therapy Activities: 4. Almost constantly Pain Interference with Day-to-Day Activities: 4. Almost constantly Home Living/Prior Functioning Home Living Available Help at Discharge: Family;Available PRN/intermittently Type of Home: House Home Access: Other (comment) (elevator (vertical platform lift) access from ground floor to main level) Home Layout: Two level;Able to live on main level with bedroom/bathroom Alternate Level Stairs-Number of Steps: 16-18 Bathroom Shower/Tub: Other (comment) (roll in shower) Bathroom Toilet: Handicapped height Bathroom Accessibility: Yes Additional Comments: receiving HH PT only  Lives With: Spouse Prior Function Level of Independence: Needs assistance with tranfers;Needs assistance with ADLs;Needs assistance with homemaking  Able to Take Stairs?: No Cognition Overall Cognitive Status: History of cognitive impairments - at baseline (reports mild baseline memory issues) Arousal/Alertness: Awake/alert Orientation Level: Oriented X4 Memory: Impaired Memory Impairment: Decreased recall of new information Awareness: Appears intact Problem Solving: Appears intact Safety/Judgment: Appears intact Sensation Sensation Light Touch: Impaired by gross assessment Additional Comments: diminsehd L LE (L1-L3) & (L4-S1) Coordination Gross Motor Movements are Fluid and Coordinated: No Fine Motor Movements are Fluid and Coordinated: No Finger Nose Finger Test: R UE dysmetria, unable to  perform L UE Heel Shin Test: intact R LE, unable to perform L LE Motor  Motor Motor: Abnormal tone Motor - Skilled Clinical Observations: PMH of Secondary Progressive MS with increased spasticity   Trunk/Postural Assessment  Cervical Assessment Cervical Assessment: Exceptions to [facility] (forward head) Thoracic Assessment Thoracic Assessment:  Exceptions to [facility] (rounded shoulders) Lumbar Assessment Lumbar Assessment: Exceptions to [facility] (posterior pelvic tilt) Postural Control Postural Control: Deficits on evaluation Righting Reactions: decreased Protective Responses: decreased  Balance Balance Balance Assessed: Yes Static Sitting Balance Static Sitting - Balance Support: Feet supported Static Sitting - Level of Assistance: 5: Stand by assistance (SBA) Dynamic Sitting Balance Dynamic Sitting - Balance Support: Feet supported Dynamic Sitting - Level of Assistance: 5: Stand by assistance (SBA) Dynamic Sitting - Balance Activities: Forward lean/weight shifting;Lateral lean/weight shifting;Reaching for objects Static Standing Balance Static Standing - Balance Support: During functional activity Static Standing - Level of Assistance: 3: Mod assist Dynamic Standing Balance Dynamic Standing - Balance Support: During functional activity Dynamic Standing - Level of Assistance: 3: Mod assist Dynamic Standing - Balance Activities: Forward lean/weight shifting;Lateral lean/weight shifting Extremity Assessment  RUE Tone RUE Tone: Modified Ashworth Body Part - Modified Ashworth Scale: Elbow;Wrist;Fingers;Thumb LUE Tone LUE Tone: (Pended) Modified Ashworth Body Part - Modified Ashworth Scale: (Pended) Elbow;Wrist;Fingers RLE Assessment RLE Assessment: Exceptions to [facility] General Strength Comments: grossly 3/5 RLE Tone RLE Tone: Modified Ashworth Body Part - Modified Ashworth Scale: Hamstrings Hamstrings - Modified Ashworth Scale for Grading Hypertonia RLE: Slight increase in muscle tone,  manifested by a catch, followed by minimal resistance throughout the remainder (less than half) of the ROM GASTROCNEMIUS - Modified Ashworth Scale for Grading Hypertonia RLE: Slight increase in muscle tone, manifested by a catch, followed by minimal resistance throughout the remainder (less than half) of the ROM LLE Assessment LLE Assessment: Exceptions to [facility] General Strength Comments: grossly 1/5 except ankle DF/PF 2/5 LLE Tone LLE Tone: Modified Ashworth Body Part - Modified Ashworth Scale: Hamstrings;Gastrocnemius Hamstrings - Modified Ashworth Scale for Grading Hypertonia LLE: Slight increase in muscle tone, manifested by a catch and release or by minimal resistance at the end of the range of motion when the affected part(s) is moved in flexion or extension Gastrocnemius - Modified Ashworth Scale for Grading Hypertonia LLE: Slight increase in muscle tone, manifested by a catch and release or by minimal resistance at the end of the range of motion when the affected part(s) is moved in flexion or extension  Care Tool Care Tool Bed Mobility Roll left and right activity   Roll left and right assist level: Contact Guard/Touching assist    Sit to lying activity Sit to lying activity did not occur: Safety/medical concerns (unable to perform due to tone, strength, activity tolerance, and coordination deficits)      Lying to sitting on side of bed activity   Lying to sitting on side of bed assist level: the ability to move from lying on the back to sitting on the side of the bed with no back support.: Minimal Assistance - Patient > 75%     Care Tool Transfers Sit to stand transfer   Sit to stand assist level: Moderate Assistance - Patient 50 - 74%    Chair/bed transfer   Chair/bed transfer assist level: Moderate Assistance - Patient 50 - 74%     Toilet transfer Toilet transfer activity did not occur: Safety/medical concerns (unable to perform due to tone, strength, activity tolerance, and  coordination deficits)      Car transfer Car transfer activity did not occur: Safety/medical concerns (unable to perform due to tone, strength, activity tolerance, and coordination deficits)        Care Tool Locomotion Ambulation Ambulation activity did not occur: Safety/medical concerns (unable to perform due to tone, strength, activity tolerance, and coordination deficits)  Walk 10 feet activity Walk 10 feet activity did not occur: Safety/medical concerns (unable to perform due to tone, strength, activity tolerance, and coordination deficits)       Walk 50 feet with 2 turns activity Walk 50 feet with 2 turns activity did not occur: Safety/medical concerns (unable to perform due to tone, strength, activity tolerance, and coordination deficits)      Walk 150 feet activity Walk 150 feet activity did not occur: Safety/medical concerns (unable to perform due to tone, strength, activity tolerance, and coordination deficits)      Walk 10 feet on uneven surfaces activity Walk 10 feet on uneven surfaces activity did not occur: Safety/medical concerns (unable to perform due to tone, strength, activity tolerance, and coordination deficits)      Stairs Stair activity did not occur: Safety/medical concerns (unable to perform due to tone, strength, activity tolerance, and coordination deficits)        Walk up/down 1 step activity Walk up/down 1 step or curb (drop down) activity did not occur: Safety/medical concerns (unable to perform due to tone, strength, activity tolerance, and coordination deficits)      Walk up/down 4 steps activity Walk up/down 4 steps activity did not occur: Safety/medical concerns (unable to perform due to tone, strength, activity tolerance, and coordination deficits)      Walk up/down 12 steps activity Walk up/down 12 steps activity did not occur: Safety/medical concerns (unable to perform due to tone, strength, activity tolerance, and coordination deficits)       Pick up small objects from floor Pick up small object from the floor (from standing position) activity did not occur: Safety/medical concerns (unable to perform due to tone, strength, activity tolerance, and coordination deficits)      Wheelchair Is the patient using a wheelchair?: Yes Type of Wheelchair: Power   Wheelchair assist level: Supervision/Verbal cueing Max wheelchair distance: 10 ft  Wheel 50 feet with 2 turns activity Wheelchair 50 feet with 2 turns activity did not occur: Safety/medical concerns (unable to perform due to tone, strength, activity tolerance, and coordination deficits)    Wheel 150 feet activity Wheelchair 150 feet activity did not occur: Safety/medical concerns (unable to perform due to tone, strength, activity tolerance, and coordination deficits)      Refer to Care Plan for Long Term Goals  SHORT TERM GOAL WEEK 1 PT Short Term Goal 1 (Week 1): STG=LTG due ELOS  Recommendations for other services: Therapeutic Recreation  Stress management  Skilled Therapeutic Intervention  Pt received semi-reclined in bed, agreeable to PT evaluation. Pt reports 9-10/10 pain L UE and BLE's and nursing notified and administered medication during session. PT obtained subjective history and assessed pain interference, sensation, coordination, strength, and tone (MAS). PT dependently donned ted hose and socks for edema and time management. Pt requires CGA with rolling and min A with supine to sit with bed features. Pt donned shirt and dress edge of bed and required SBA for dynamic sitting balance. Pt mod A with sit to stand and stand pivot to [place]. Pt utilized power w/c functions to re-position and scoot back into chair. Pt left seated in PWC with NT present.   Mobility Bed Mobility Bed Mobility: Rolling Right;Rolling Left;Supine to Sit Rolling Right: Contact Guard/Touching assist Rolling Left: Contact Guard/Touching assist Supine to Sit: Minimal Assistance - Patient >  75% Transfers Transfers: Sit to Stand;Stand to Sit;Stand Pivot Transfers Sit to Stand: Moderate Assistance - Patient 50-74% Stand to Sit: Moderate Assistance - Patient 50-74% Stand Pivot  Transfers: Moderate Assistance - Patient 50 - 74% Stand Pivot Transfer Details: Visual cues/gestures for sequencing;Verbal cues for precautions/safety Transfer (Assistive device): None Locomotion  Gait Ambulation: No Gait Gait: No Stairs / Additional Locomotion Stairs: No Wheelchair Mobility Wheelchair Mobility: Yes Wheelchair Assistance: Chartered loss adjuster: Power Wheelchair Parts Management: Supervision/cueing Distance: 10 ft   Discharge Criteria: Patient will be discharged from PT if patient refuses treatment 3 consecutive times without medical reason, if treatment goals not met, if there is a change in medical status, if patient makes no progress towards goals or if patient is discharged from hospital.  The above assessment, treatment plan, treatment alternatives and goals were discussed and mutually agreed upon: by patient  Tanja Port PT, DPT  03/14/2022, 1:46 PM

## 2022-03-14 NOTE — Discharge Instructions (Addendum)
Inpatient Rehab Discharge Instructions  Sharon Odom Discharge date and time: 03/25/2022   Activities/Precautions/ Functional Status: Activity: no lifting, driving, or strenuous exercise until cleared by MD Diet: regular diet Wound Care: none needed Functional status:  ___ No restrictions     ___ Walk up steps independently _x__ 24/7 supervision/assistance   ___ Walk up steps with assistance ___ Intermittent supervision/assistance  ___ Bathe/dress independently ___ Walk with walker     ___ Bathe/dress with assistance ___ Walk Independently    ___ Shower independently ___ Walk with assistance    _x__ Shower with assistance __x_ No alcohol     ___ Return to work/school ________  Special Instructions: No driving, alcohol consumption or tobacco use.   COMMUNITY REFERRALS UPON DISCHARGE:    Home Health:   PT                Agency: [facility]   Phone:[phone]    Medical Equipment/Items Ordered:HAS ALL NEEDED EQUIPMENT FROM PAST ADMISSIONS                                                 Agency/Supplier:NA   My questions have been answered and I understand these instructions. I will adhere to these goals and the provided educational materials after my discharge from the hospital.  Patient/Caregiver Signature _______________________________ Date __________  Clinician Signature _______________________________________ Date __________  Please bring this form and your medication list with you to all your follow-up doctor's appointments.

## 2022-03-14 NOTE — Progress Notes (Signed)
[facility] Individual Statement of Services  Patient Name:  Sharon Odom  Date:  03/14/2022  Welcome to the [facility].  Our goal is to provide you with an individualized program based on your diagnosis and situation, designed to meet your specific needs.  With this comprehensive rehabilitation program, you will be expected to participate in at least 3 hours of rehabilitation therapies Monday-Friday, with modified therapy programming on the weekends.  Your rehabilitation program will include the following services:  Physical Therapy (PT), Occupational Therapy (OT), Speech Therapy (ST), 24 hour per day rehabilitation nursing, Therapeutic Recreaction (TR), Neuropsychology, Care Coordinator, Rehabilitation Medicine, Nutrition Services, and Pharmacy Services  Weekly team conferences will be held on Tuesday to discuss your progress.  Your Inpatient Rehabilitation Care Coordinator will talk with you frequently to get your input and to update you on team discussions.  Team conferences with you and your family in attendance may also be held.  Expected length of stay: 7-10 days  Overall anticipated outcome: min/mod wheelchair level  Depending on your progress and recovery, your program may change. Your Inpatient Rehabilitation Care Coordinator will coordinate services and will keep you informed of any changes. Your Inpatient Rehabilitation Care Coordinator's name and contact numbers are listed  below.  The following services may also be recommended but are not provided by the [facility]:   [place] will be made to provide these services after discharge if needed.  Arrangements include referral to agencies that provide these services.  Your insurance has been verified to be:  Medicare part A and [place] Your primary doctor is:  Baker Pierini  Pertinent information  will be shared with your doctor and your insurance company.  Inpatient Rehabilitation Care Coordinator:  Ovidio Kin, [place] or Emilia Beck  Information discussed with and copy given to patient by: Elease Hashimoto, 03/14/2022, 2:16 PM

## 2022-03-14 NOTE — Plan of Care (Signed)
  Problem: RH Swallowing Goal: LTG Patient will consume least restrictive diet using compensatory strategies with assistance (SLP) Description: LTG:  Patient will consume least restrictive diet using compensatory strategies with assistance (SLP) Flowsheets (Taken 03/14/2022 1403) LTG: Pt Patient will consume least restrictive diet using compensatory strategies with assistance of (SLP): Supervision Goal: LTG Patient will participate in dysphagia therapy to increase swallow function with assistance (SLP) Description: LTG:  Patient will participate in dysphagia therapy to increase swallow function with assistance (SLP) Flowsheets (Taken 03/14/2022 1403) LTG: Pt will participate in dysphagia therapy to increase swallow function with assistance of (SLP): Supervision Goal: LTG Pt will demonstrate functional change in swallow as evidenced by bedside/clinical objective assessment (SLP) Description: LTG: Patient will demonstrate functional change in swallow as evidenced by bedside/clinical objective assessment (SLP) Flowsheets (Taken 03/14/2022 1403) LTG: Patient will demonstrate functional change in swallow as evidenced by bedside/clinical objective assessment: Oropharyngeal swallow

## 2022-03-14 NOTE — Progress Notes (Signed)
Inpatient Rehabilitation  Patient information reviewed and entered into eRehab system by Shahil Speegle M. Angelisse Riso, M.A., CCC/SLP, PPS Coordinator.  Information including medical coding, functional ability and quality indicators will be reviewed and updated through discharge.    

## 2022-03-14 NOTE — Evaluation (Signed)
Occupational Therapy Assessment and Plan  Patient Details  Name: Sharon Odom MRN: [id number] Date of Birth: [birth year]  OT Diagnosis: abnormal posture, acute pain, cognitive deficits, hemiplegia affecting non-dominant side, and muscle weakness (generalized) Rehab Potential: Rehab Potential (ACUTE ONLY): Fair ELOS: 7-10 days   Today's Date: 03/14/2022 OT Individual Time: 9678-9381 OT Individual Time Calculation (min): 80 min     Hospital Problem: Principal Problem:   Multiple sclerosis ([place]) Active Problems:   S/P insertion of intrathecal pump   Past Medical History:  Past Medical History:  Diagnosis Date   Allergies    Anxiety    Arthritis    Asthma    seasonal - pollen   Constipation    Depression    Dyspnea    with exertion   GERD (gastroesophageal reflux disease)    Headache    History of hiatal hernia    Left radial nerve palsy 03/15/2020   resolved - Left arm/hand weak   Multiple sclerosis ([place])    Pneumonia 2007   x 1   Vision abnormalities    Wears glasses    Past Surgical History:  Past Surgical History:  Procedure Laterality Date   COLONOSCOPY     EXCISION MORTON'S NEUROMA Right    fallopian tube removal     INTRATHECAL PUMP IMPLANT Right 02/18/2019   Procedure: INTRATHECAL PUMP IMPLANT;  Surgeon: Clydell Hakim, MD;  Location: [place];  Service: Neurosurgery;  Laterality: Right;  INTRATHECAL PUMP IMPLANT   INTRATHECAL PUMP IMPLANT Right 02/17/2021   Procedure: Baclofen Pump Replacement;  Surgeon: Erline Levine, MD;  Location: [place];  Service: Neurosurgery;  Laterality: Right;  3C/RM 21   INTRATHECAL PUMP REVISION N/A 01/11/2022   Procedure: Baclofen Pump revision;  Surgeon: Dawley, Theodoro Doing, DO;  Location: [place];  Service: Neurosurgery;  Laterality: N/A;  RM 21 to follow   PAIN PUMP IMPLANTATION N/A 09/28/2021   Procedure: Insertion of baclofen pump, right lower quadrant;  Surgeon: Dawley, Theodoro Doing, DO;  Location: [place];  Service: Neurosurgery;  Laterality:  N/A;   PAIN PUMP REMOVAL Left 04/06/2021   Procedure: Removal of baclofen pump, Left;  Surgeon: Dawley, Theodoro Doing, DO;  Location: [place];  Service: Neurosurgery;  Laterality: Left;  Lateral/Left//3C rm 19   UPPER GI ENDOSCOPY     WISDOM TOOTH EXTRACTION     WISDOM TOOTH EXTRACTION      Assessment & Plan Clinical Impression:She had limited mobility and uses a power wheelchair. Sleeps in chair or on couch. She can drive WC into handicap shower and transfers to shower bench. She uses a portable BSC transfers for bowel program. She has a vertical power lift in her garage to from from power WC to car. She uses a manual WC in the community for appointments. He husband lifts her onto couch, into car, etc as she has not stood for many months. The patient requires inpatient physical medicine and rehabilitation evaluations and treatment secondary to dysfunction due to secondary progressive multiple sclerosis  Patient currently requires mod-max with basic self-care skills secondary to muscle weakness, decreased cardiorespiratoy endurance, abnormal tone, unbalanced muscle activation, and decreased coordination, decreased safety awareness, decreased memory, and delayed processing, and decreased sitting balance, decreased standing balance, decreased postural control, hemiplegia, and decreased balance strategies.  Prior to hospitalization, patient could complete BADL/IADL with min-mod.  Patient will benefit from skilled intervention to decrease level of assist with basic self-care skills and increase independence with basic self-care skills prior to discharge home with care partner.  Anticipate patient will require 24 hour supervision, minimal physical assistance, and moderate physical assestance and follow up home health.  OT - End of Session Endurance Deficit: Yes Endurance Deficit Description: decreased OT Assessment Rehab Potential (ACUTE ONLY): Fair OT Barriers to Discharge: Home environment  access/layout;Inaccessible home environment OT Basic ADL's Functional Problem(s): Grooming;Bathing;Toileting;Dressing OT Transfers Functional Problem(s): Toilet;Tub/Shower OT Additional Impairment(s): Fuctional Use of Upper Extremity OT Plan OT Intensity: Minimum of 1-2 x/day, 45 to 90 minutes OT Frequency: 5 out of 7 days OT Duration/Estimated Length of Stay: 7-10 days OT Treatment/Interventions: Medical illustrator training;Patient/family education;Therapeutic Exercise;Functional mobility training;Self Care/advanced ADL retraining;UE/LE Strength taining/ROM;UE/LE Coordination activities;Neuromuscular re-education;Discharge planning;Disease mangement/prevention;Pain management OT Self Feeding Anticipated Outcome(s): no goal OT Basic Self-Care Anticipated Outcome(s): MOD A OT Toileting Anticipated Outcome(s): MIN A OT Bathroom Transfers Anticipated Outcome(s): Supervision toilet; CGA shower OT Recommendation Patient destination: Home Follow Up Recommendations: Home health OT Equipment Recommended: None recommended by OT   OT Evaluation Precautions/Restrictions  Precautions Precautions: Fall Restrictions Weight Bearing Restrictions: No General Chart Reviewed: Yes Family/Caregiver Present: No Vital Signs Therapy Vitals Temp: 98.7 F (37.1 C) Temp Source: Oral Pulse Rate: 76 Resp: 18 BP: 97/60 Patient Position (if appropriate): Lying Oxygen Therapy SpO2: 99 % O2 Device: Room Air Pain Pain Assessment Pain Scale: 0-10 Pain Score: 8  Pain Location: Generalized Pain Intervention(s): Medication (See eMAR) 2nd Pain Site Pain Intervention(s): Medication (See eMAR) Home Living/Prior Functioning Home Living Living Arrangements: Spouse/significant other Available Help at Discharge: Family, Available PRN/intermittently Type of Home: House Home Access: Other (comment) Home Layout: Two level, Able to live on main level with bedroom/bathroom Alternate Level Stairs-Number of  Steps: 16-18 Bathroom Shower/Tub: Other (comment) Bathroom Toilet: Handicapped height Bathroom Accessibility: Yes Additional Comments: receiving HH PT only  Lives With: Spouse Prior Function Level of Independence: Needs assistance with tranfers, Needs assistance with ADLs, Needs assistance with homemaking  Able to Take Stairs?: No Vision Baseline Vision/History: 1 Wears glasses Ability to See in Adequate Light: 0 Adequate Patient Visual Report: No change from baseline Vision Assessment?: No apparent visual deficits Perception  Perception: Within Functional Limits Praxis Praxis: Intact Cognition Cognition Overall Cognitive Status: History of cognitive impairments - at baseline Arousal/Alertness: Awake/alert Memory: Impaired Memory Impairment: Decreased recall of new information Awareness: Appears intact Problem Solving: Appears intact Safety/Judgment: Appears intact Brief Interview for Mental Status (BIMS) Repetition of Three Words (First Attempt): 3 Temporal Orientation: Year: Correct Temporal Orientation: Month: Accurate within 5 days Temporal Orientation: Day: Correct Recall: "Sock": Yes, after cueing ("something to wear") Recall: "Blue": Yes, no cue required Recall: "Bed": No, could not recall BIMS Summary Score: 12 Sensation Sensation Light Touch: Impaired by gross assessment (decreased LUE>RUE) Coordination Gross Motor Movements are Fluid and Coordinated: No Fine Motor Movements are Fluid and Coordinated: No Coordination and Movement Description: able to oppose all digits to thumb with increased time Finger Nose Finger Test: R UE dysmetria, unable to perform L UE Motor  Motor Motor: Abnormal tone Motor - Skilled Clinical Observations: PMH of Secondary Progressive MS with increased spasticity  Trunk/Postural Assessment  Cervical Assessment Cervical Assessment: Exceptions to [facility] (forward head) Thoracic Assessment Thoracic Assessment: Exceptions to [facility] (rounded  shoudlers) Lumbar Assessment Lumbar Assessment: Exceptions to [facility] (post tilt- ? pelvic obliquity) Postural Control Postural Control: Deficits on evaluation Righting Reactions: decreased Protective Responses: decreased  Balance Balance Balance Assessed: Yes Static Sitting Balance Static Sitting - Balance Support: Feet supported Static Sitting - Level of Assistance: 5: Stand by assistance Dynamic Sitting Balance Dynamic Sitting - Balance  Support: Feet supported Dynamic Sitting - Level of Assistance: 5: Stand by assistance Static Standing Balance Static Standing - Balance Support: During functional activity Static Standing - Level of Assistance: 3: Mod assist Extremity/Trunk Assessment RUE Assessment General Strength Comments: generalized weakness RUE Tone RUE Tone: Modified Ashworth Elbow - Modified Ashworth Scale for Grading Hypertonia RUE: Slight increase in muscle tone, manifested by a catch and release or by minimal resistance at the end of the range of motion when the affected part(s) is moved in flexion or extension Wrist - Modified Ashworth Scale for Grading Hypertonia RUE: No increase in muscle tone Fingers - Modified Ashworth Scale for Grading Hypertonia RUE: No increase in muscle tone Thumb - Modified Ashworth Scale for Grading Hypertonia RUE: No increase in muscle tone LUE Assessment LUE Assessment: Exceptions to WFL LUE Tone LUE Tone: Modified Ashworth Body Part - Modified Ashworth Scale: Elbow;Wrist;Fingers Elbow - Modified Ashworth Scale for Grading Hypertonia LUE: Affected part(s) rigid in flexion or extension Wrist - Modified Ashworth Scale for Grading Hypertonia LUE: No increase in muscle tone Fingers - Modified Ashworth Scale for Grading Hypertonia LUE: No increase in muscle tone Thumb - Modified Ashworth Scale for Grading Hypertonia LUE: No increase in muscle tone  Care Tool Care Tool Self Care Eating   Eating Assist Level: Set up assist    Oral Care     Oral Care Assist Level: Set up assist    Bathing Bathing activity did not occur: Refused            Upper Body Dressing(including orthotics)   What is the patient 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)   Assist Level: Maximal Assistance - Patient 25 - 49%    Lower Body Dressing (excluding footwear)     Assist for lower body dressing: Maximal Assistance - Patient 25 - 49%    Putting on/Taking off footwear   What is the patient wearing?: Non-skid slipper socks Assist for footwear: Dependent - Patient 0%       Care Tool Toileting Toileting activity   Assist for toileting: Moderate Assistance - Patient 50 - 74%     Care Tool Bed Mobility Roll left and right activity        Sit to lying activity        Lying to sitting on side of bed activity         Care Tool Transfers Sit to stand transfer        Chair/bed transfer         Toilet transfer   Assist Level: Minimal Assistance - Patient > 75%     Care Tool Cognition  Expression of Ideas and Wants    Understanding Verbal and Non-Verbal Content     Memory/Recall Ability     Refer to Care Plan for Long Term Goals  SHORT TERM GOAL WEEK 1 OT Short Term Goal 1 (Week 1): STG=LTG d/t ELOS  Recommendations for other services: None    Skilled Therapeutic Intervention ADL ADL Toileting: Moderate assistance Where Assessed-Toileting: Bedside Commode Toilet Transfer: Minimal assistance Toilet Transfer Method: Squat pivot Toilet Transfer Equipment: Bedside commode;Other (comment) (PWC) Mobility  Bed Mobility Rolling Right: Contact Guard/Touching assist Rolling Left: Contact Guard/Touching assist Supine to Sit: Minimal Assistance - Patient > 75% Transfers Sit to Stand: Moderate Assistance - Patient 50-74% Stand to Sit: Moderate Assistance - Patient 50-74%  Pt received in [place]. Pt was educated on role/purpose of OT, CIR, ELOS, and POC. Pt verbose throughotu session of PLOF, decline in function in recent  time, often requiring  redirection to currrent question d/t somewhat tangential in nature. Pt already dressed and declines full BADL but wanting to put a new pad in underwear, but then reporting need to toilet. Pt requires significantly increased time to setup power wheelchair in bathroom and OT places BSC over toilet. Pt requires MIN A for partial stand/squat pivot to BSC and overall MOD A for toielting to help manage pants up hips after toileting. Pt able to wipe self posteiorly by using large anterior weight shift resting chest on thighs and LUE to support bar on PWC. Pt requires increased time for all actions. Modified ashworth completed on LUE: Bicep/tricep 4, wrist flex/ext/shoulder abduction-0, shoulder flexion/ext 1. Pt set up with all needs in [place], call light in reach and all needs met.    Discharge Criteria: Patient will be discharged from OT if patient refuses treatment 3 consecutive times without medical reason, if treatment goals not met, if there is a change in medical status, if patient makes no progress towards goals or if patient is discharged from hospital.  The above assessment, treatment plan, treatment alternatives and goals were discussed and mutually agreed upon: by patient  Tonny Branch 03/14/2022, 6:01 PM

## 2022-03-14 NOTE — Evaluation (Signed)
Speech Language Pathology Assessment and Plan  Patient Details  Name: Sharon Odom MRN: [id number] Date of Birth: Jun 06, 1970  SLP Diagnosis: Dysphagia  Rehab Potential: Good ELOS: 7-10 days    Today's Date: 03/14/2022 SLP Individual Time: 1116-1229 SLP Individual Time Calculation (min): 68 min   Hospital Problem: Principal Problem:   Multiple sclerosis ([place]) Active Problems:   S/P insertion of intrathecal pump  Past Medical History:  Past Medical History:  Diagnosis Date   Allergies    Anxiety    Arthritis    Asthma    seasonal - pollen   Constipation    Depression    Dyspnea    with exertion   GERD (gastroesophageal reflux disease)    Headache    History of hiatal hernia    Left radial nerve palsy 03/15/2020   resolved - Left arm/hand weak   Multiple sclerosis ([place])    Pneumonia 2007   x 1   Vision abnormalities    Wears glasses    Past Surgical History:  Past Surgical History:  Procedure Laterality Date   COLONOSCOPY     EXCISION MORTON'S NEUROMA Right    fallopian tube removal     INTRATHECAL PUMP IMPLANT Right 02/18/2019   Procedure: INTRATHECAL PUMP IMPLANT;  Surgeon: Clydell Hakim, MD;  Location: [place];  Service: Neurosurgery;  Laterality: Right;  INTRATHECAL PUMP IMPLANT   INTRATHECAL PUMP IMPLANT Right 02/17/2021   Procedure: Baclofen Pump Replacement;  Surgeon: Erline Levine, MD;  Location: [place];  Service: Neurosurgery;  Laterality: Right;  3C/RM 21   INTRATHECAL PUMP REVISION N/A 01/11/2022   Procedure: Baclofen Pump revision;  Surgeon: Dawley, Theodoro Doing, DO;  Location: [place];  Service: Neurosurgery;  Laterality: N/A;  RM 21 to follow   PAIN PUMP IMPLANTATION N/A 09/28/2021   Procedure: Insertion of baclofen pump, right lower quadrant;  Surgeon: Dawley, Theodoro Doing, DO;  Location: [place];  Service: Neurosurgery;  Laterality: N/A;   PAIN PUMP REMOVAL Left 04/06/2021   Procedure: Removal of baclofen pump, Left;  Surgeon: Dawley, Theodoro Doing, DO;  Location: [place];   Service: Neurosurgery;  Laterality: Left;  Lateral/Left//3C rm 19   UPPER GI ENDOSCOPY     WISDOM TOOTH EXTRACTION     WISDOM TOOTH EXTRACTION      Assessment / Plan / Recommendation Clinical Impression  51 year old female with history of 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 Initial MS diagnosis 2013. ITB pump removed 04/06/21 due to pump infection. Revised pump 01/11/22.    Presented on 03/13/22 from home to fine tune ITB pump that can not be done in visits to outpatient clinic. Also in need of intensive rehabilitation to improve her over all level of function.   Currently drinking regular liquids as no longer in need of nectar thick liquids. Does take pills with applesauce. Has foley indwelling catheter. Continent of bowel but spouse reports BM schedule usually every two weeks and is continent. On miralax, metamucil and senokot. Chronic pain with spasticity issues. Home regimen of Valium, Zabnaflex, and baclofen. Oxycodone reportedly gives little relief. On lamictal for nerve pain. Recently began Dantrolene. Outpatient follow up with Dr Melene [facility]. Last [facility] January 2021.    Pt presents with mild dysphagia and would benefit from an instrumental swallow assessment to assess current diet tolerance. Pt's most recent MBS in january of 2023 noted silent aspiration on thin liquids and recommended nectar thick liquids and solid textures. Pt's recent  chest x-ray noted no active disease, but did not a possible foreign object in lower lobe. Pt's oral motor exam appeared [facility], expect for mild reduction in palatal elevation. Pt supports GERD and at times entering into nasal cavity. Pt demonstrated appropriate containment, manipulation, oral clearance (possible delay in swallow initiation) and only x1 overt immediate cough following thin liquids via straw. Pt coughed up sputum verse liquid, questioning aspiration verse congestion. Pt  demonstrated low vocal intensity, wet vocal quality and hyponasal resonance, but supports is baseline, throat clears and weak cough result in minimal clearing. Pt supports cognition is baseline with chronic mild impairment in short term recall.  Pt would benefit from instrumental swallow assessment, skilled ST services in order to maximize functional independence and reduce burden of care, likely requiring supervision at discharge with continued skilled ST services.   Skilled Therapeutic Interventions          Skilled ST services focused on swallow skills. SLP facilitated administration of BSE and provided education of results. SLP and pt collaborated to set goals for swallow needs during length of stay. All questions answered to satisfaction.  Pt was left in room with call bell within reach and chair alarm set. SLP recommends to continue skilled services.    SLP Assessment  Patient will need skilled Speech Lanaguage Pathology Services during CIR admission    Recommendations  SLP Diet Recommendations: Thin;Age appropriate regular solids Liquid Administration via: Straw Medication Administration: Whole meds with puree Supervision: Patient able to self feed;Intermittent supervision to cue for compensatory strategies Compensations: Minimize environmental distractions;Slow rate;Small sips/bites Postural Changes and/or Swallow Maneuvers: Seated upright 90 degrees;Upright 30-60 min after meal Oral Care Recommendations: Oral care BID Patient destination: Home Follow up Recommendations: 24 hour supervision/assistance Equipment Recommended: None recommended by SLP    SLP Frequency 3 to 5 out of 7 days   SLP Duration  SLP Intensity  SLP Treatment/Interventions 7-10 days  Minumum of 1-2 x/day, 30 to 90 minutes  Dysphagia/aspiration precaution training;Patient/family education    Pain Pain Assessment Pain Score: 0-No pain  Prior Functioning Cognitive/Linguistic Baseline: Within functional  limits Type of Home: House  Lives With: Spouse Available Help at Discharge: Family;Available PRN/intermittently  SLP Evaluation Cognition Overall Cognitive Status: History of cognitive impairments - at baseline (reports mild baseline memory issues) Arousal/Alertness: Awake/alert Orientation Level: Oriented X4 Memory: Impaired Memory Impairment: Decreased recall of new information Awareness: Appears intact Problem Solving: Appears intact Safety/Judgment: Appears intact  Comprehension Auditory Comprehension Overall Auditory Comprehension: Appears within functional limits for tasks assessed Expression Expression Primary Mode of Expression: Verbal (reports baseline) Verbal Expression Overall Verbal Expression: Impaired Level of Generative/Spontaneous Verbalization: Conversation Interfering Components: Speech intelligibility Written Expression Dominant Hand: Right Written Expression: Not tested Oral Motor Oral Motor/Sensory Function Overall Oral Motor/Sensory Function: Within functional limits Motor Speech Overall Motor Speech: Impaired Respiration: Within functional limits Phonation: Wet Resonance: Hyponasality Articulation: Within functional limitis Intelligibility: Intelligible Motor Planning: Witnin functional limits Motor Speech Errors: Not applicable  Care Tool Care Tool Cognition Ability to hear (with hearing aid or hearing appliances if normally used Ability to hear (with hearing aid or hearing appliances if normally used): 0. Adequate - no difficulty in normal conservation, social interaction, listening to TV   Expression of Ideas and Wants Expression of Ideas and Wants: 3. Some difficulty - exhibits some difficulty with expressing needs and ideas (e.g, some words or finishing thoughts) or speech is not clear   Understanding Verbal and Non-Verbal Content Understanding Verbal and Non-Verbal Content: 4. Understands (  complex and basic) - clear comprehension without  cues or repetitions  Memory/Recall Ability Memory/Recall Ability : Current season;Location of own room;That he or she is in a hospital/hospital unit;Staff names and faces   PMSV Assessment  PMSV Trial Intelligibility: Intelligible  Bedside Swallowing Assessment General Previous Swallow Assessment: 1/23 nectar thick and regular Diet Prior to this Study: Regular;Thin liquids Respiratory Status: Room air Behavior/Cognition: Alert;Cooperative;Pleasant mood Oral Cavity - Dentition: Adequate natural dentition Self-Feeding Abilities: Able to feed self Patient Positioning: Upright in chair/Tumbleform Baseline Vocal Quality: Wet;Low vocal intensity Volitional Cough: Weak Volitional Swallow: Able to elicit  Oral Care Assessment Oral Assessment  (WDL): Within Defined Limits Level of Consciousness: Alert Is patient on any of following O2 devices?: None of the above Nutritional status: No high risk factors Oral Assessment Risk : Low Risk Ice Chips Ice chips: Not tested Thin Liquid Thin Liquid: Impaired Presentation: Straw Pharyngeal  Phase Impairments: Cough - Delayed Other Comments: sputum Nectar Thick Nectar Thick Liquid: Not tested Honey Thick Honey Thick Liquid: Not tested Puree Puree: Not tested Solid Solid: Within functional limits BSE Assessment Suspected Esophageal Findings Suspected Esophageal Findings: Regurgitation (supported by pt but not wittness by SLP) Risk for Aspiration Impact on safety and function: Mild aspiration risk Other Related Risk Factors: History of pneumonia;History of dysphagia;Decreased respiratory status  Short Term Goals: Week 1: SLP Short Term Goal 1 (Week 1): STG=LTG due to short ELOS  Refer to Care Plan for Long Term Goals  Recommendations for other services: None   Discharge Criteria: Patient will be discharged from SLP if patient refuses treatment 3 consecutive times without medical reason, if treatment goals not met, if there is a change  in medical status, if patient makes no progress towards goals or if patient is discharged from hospital.  The above assessment, treatment plan, treatment alternatives and goals were discussed and mutually agreed upon: by patient  Trysten Bernard  [facility] 03/14/2022, 1:48 PM

## 2022-03-14 NOTE — Progress Notes (Signed)
Inpatient Rehabilitation Care Coordinator Assessment and Plan Patient Details  Name: Sharon Odom MRN: [id number] Date of Birth: Feb 01, 1971  Today's Date: 03/14/2022  Hospital Problems: Principal Problem:   Multiple sclerosis ([place]) Active Problems:   S/P insertion of intrathecal pump  Past Medical History:  Past Medical History:  Diagnosis Date   Allergies    Anxiety    Arthritis    Asthma    seasonal - pollen   Constipation    Depression    Dyspnea    with exertion   GERD (gastroesophageal reflux disease)    Headache    History of hiatal hernia    Left radial nerve palsy 03/15/2020   resolved - Left arm/hand weak   Multiple sclerosis ([place])    Pneumonia 2007   x 1   Vision abnormalities    Wears glasses    Past Surgical History:  Past Surgical History:  Procedure Laterality Date   COLONOSCOPY     EXCISION MORTON'S NEUROMA Right    fallopian tube removal     INTRATHECAL PUMP IMPLANT Right 02/18/2019   Procedure: INTRATHECAL PUMP IMPLANT;  Surgeon: Clydell Hakim, MD;  Location: [place];  Service: Neurosurgery;  Laterality: Right;  INTRATHECAL PUMP IMPLANT   INTRATHECAL PUMP IMPLANT Right 02/17/2021   Procedure: Baclofen Pump Replacement;  Surgeon: Erline Levine, MD;  Location: [facility];  Service: Neurosurgery;  Laterality: Right;  3C/RM 21   INTRATHECAL PUMP REVISION N/A 01/11/2022   Procedure: Baclofen Pump revision;  Surgeon: Dawley, Theodoro Doing, DO;  Location: [facility];  Service: Neurosurgery;  Laterality: N/A;  RM 21 to follow   PAIN PUMP IMPLANTATION N/A 09/28/2021   Procedure: Insertion of baclofen pump, right lower quadrant;  Surgeon: Dawley, Theodoro Doing, DO;  Location: [place];  Service: Neurosurgery;  Laterality: N/A;   PAIN PUMP REMOVAL Left 04/06/2021   Procedure: Removal of baclofen pump, Left;  Surgeon: Dawley, Theodoro Doing, DO;  Location: [facility];  Service: Neurosurgery;  Laterality: Left;  Lateral/Left//3C rm 19   UPPER GI ENDOSCOPY     WISDOM TOOTH EXTRACTION     WISDOM TOOTH  EXTRACTION     Social History:  reports that she has been smoking cigarettes. She started smoking about 35 years ago. She has a 16.00 pack-year smoking history. She has never used smokeless tobacco. She reports that she does not currently use alcohol. She reports that she does not use drugs.  Family / Support Systems Marital Status: Married Patient Roles: Spouse Spouse/Significant Other: Shanon Brow [phone] Other Supports: Linda-mother in-law [phone] Anticipated Caregiver: Shanon Brow Ability/Limitations of Caregiver: Works days pt alone during the day Caregiver Availability: Evenings only Family Dynamics: Close with husband and mother in-law pt tries to be as independent as possible with her deficits. She is not one to ask others for help but would rather figure out a way on her own  Social History Preferred language: English Religion:  Cultural Background: No issues Education: Secretary/administrator educated Health Literacy - How often do you need to have someone help you when you read instructions, pamphlets, or other written material from your doctor or pharmacy?: Never Writes: Yes Employment Status: Disabled Public relations account executive Issues: No issues Guardian/Conservator: None-according to MD pt is capable of making his own decisions while here. Husband wil be here in the evenings   Abuse/Neglect Abuse/Neglect Assessment Can Be Completed: Yes Physical Abuse: Denies Verbal Abuse: Denies Sexual Abuse: Denies Exploitation of patient/patient's resources: Denies Self-Neglect: Denies  Patient response to: Social Isolation - How often do you  feel lonely or isolated from those around you?: Rarely  Emotional Status Pt's affect, behavior and adjustment status: Pt is hoping with her being here and adjusting her bacolofen pump it will help her to become more independent. She feels she will do her part but hopeful she can be better than she was prior to admission Recent Psychosocial Issues: other  health issues Psychiatric History: Hx-depressiion/anxiety takes medications for this and finds them helpful. Have placed pt on neuro-psch list to be seen while here. Substance Abuse History: Tobacco aware of health risks and resources available to her  Patient / Family Perceptions, Expectations & Goals Pt/Family understanding of illness & functional limitations: Pt is able to explain her MS and issues regarding this. She has had this since 2013. She is followed by Dr Berline Chough in the clinic and feels has a good understanding of her plan moving forward. Premorbid pt/family roles/activities: Wife, daughter in-law, retiree, etc Anticipated changes in roles/activities/participation: resume Pt/family expectations/goals: Pt states: " I hope to do more for myself when I leave here if this pump can work better."  [employer]: Other (Comment) Premorbid Home Care/DME Agencies: Other (Comment) (Active pt with [facility] has all DME) Transportation available at discharge: husband Is the patient able to respond to transportation needs?: Yes In the past 12 months, has lack of transportation kept you from medical appointments or from getting medications?: No In the past 12 months, has lack of transportation kept you from meetings, work, or from getting things needed for daily living?: No Resource referrals recommended: Neuropsychology  Discharge Planning Living Arrangements: Spouse/significant other Support Systems: Spouse/significant other, Other relatives Type of Residence: Private residence Insurance Resources: Media planner (specify) Financial Resources: SSD, Family Support Financial Screen Referred: No Living Expenses: Own Money Management: Family Does the patient have any problems obtaining your medications?: No Home Management: husband Patient/Family Preliminary Plans: Return home with husband who is there in the evenings but needs to work during the day. Pt has been  alone and according to her safe at home. Will await therapy evaluations and work on discharge needs. Care Coordinator Barriers to Discharge: Lack of/limited family support, Insurance for SNF coverage Care Coordinator Anticipated Follow Up Needs: HH/OP  Clinical Impression Pleasant female who does well directing her care. She is motivated to do well here and hopeful with the adjustment of the baclofen pump she will do well. Being evaluated today and goals being set. Have placed on the neuro-psych list to be seen. Will await therapist's evaluations.   Lucy Chris 03/14/2022, 2:01 PM

## 2022-03-14 NOTE — Progress Notes (Signed)
PROGRESS NOTE   Subjective/Complaints:   Pt reports bad GERD/reflux- so bad she will spit up acid. So asked to try something "prescription".   Went over all meds and timing of them- including baclofen, diazepam, zanaflex, and naproxen- cannot do with ibuprofen.   Very stiff- hard to move this AM- is normal for her- didn't sleep well due to pain- has sharp stabbing pain from thigh to groin last night.   ROS:  Pt denies SOB, abd pain, CP, N/V/C/D, and vision changes    Objective:   DG Chest Port 1 View  Result Date: 03/13/2022 CLINICAL DATA:  [facility] EXAM: PORTABLE CHEST 1 VIEW COMPARISON:  06/18/2021 FINDINGS: The heart size and mediastinal contours are within normal limits. Both lungs are clear. Old right-sided rib fractures. Punctate square shaped metallic density overlying the fifth vertebral body. IMPRESSION: No active disease. Punctate metallic density overlying the mid mediastinal region is indeterminate for artifact or foreign body, consider two-view chest follow-up for further assessment. Electronically Signed   By: Jasmine Pang M.D.   On: 03/13/2022 16:10   Recent Labs    03/13/22 1543  WBC 6.6  HGB 11.7*  HCT 36.0  PLT 267   Recent Labs    03/13/22 1543  NA 140  K 3.3*  CL 102  CO2 31  GLUCOSE 99  BUN 7  CREATININE 0.71  CALCIUM 9.6    Intake/Output Summary (Last 24 hours) at 03/14/2022 0843 Last data filed at 03/13/2022 2230 Gross per 24 hour  Intake --  Output 1400 ml  Net -1400 ml        Physical Exam: Vital Signs Blood pressure 120/77, pulse 87, temperature 98.1 F (36.7 C), temperature source Oral, resp. rate 18, height 5\' 9"  (1.753 m), weight 57.6 kg, SpO2 100 %.   General: awake, alert, appropriate, sitting up slightly in bed; NAD HENT: conjugate gaze; oropharynx dry- has almost aphonia/dysarthria CV: regular rate; no JVD Pulmonary: less coarse this AM- but voice slightly  gurgly GI: soft, NT, ND, (+)BS Psychiatric: appropriate- a little flat Neurological: Ox3- has 5 beats clonus LLE  GU: leg bag from foley almost full Extremities: 2+ RLE ankle/lower calf/foot swelling- pitting MS: RUE 5-/5 except FA 4+/5 LUE 4-/5 throughout RLE- 4-/5;  LLE- HF 1/5; KE 2-/5; otherwise 1/5 Neuro: Low tone LUE RUE- normal tone RLE MAS of 2-3 with significant extensor tone with simple ROM LLE MAS 1+ No hoffman's B/L Few beats clonus on LLE but 5-7 beats RLE Psych: interactive; slightly flat affect; slightly depressed      Assessment/Plan: 1. Functional deficits which require 3+ hours per day of interdisciplinary therapy in a comprehensive inpatient rehab setting. Physiatrist is providing close team supervision and 24 hour management of active medical problems listed below. Physiatrist and rehab team continue to assess barriers to discharge/monitor patient progress toward functional and medical goals  Care Tool:  Bathing              Bathing assist       Upper Body Dressing/Undressing Upper body dressing        Upper body assist      Lower Body Dressing/Undressing Lower body dressing  Lower body assist       Toileting Toileting    Toileting assist       Transfers Chair/bed transfer  Transfers assist           Locomotion Ambulation   Ambulation assist              Walk 10 feet activity   Assist           Walk 50 feet activity   Assist           Walk 150 feet activity   Assist           Walk 10 feet on uneven surface  activity   Assist           Wheelchair     Assist               Wheelchair 50 feet with 2 turns activity    Assist            Wheelchair 150 feet activity     Assist          Blood pressure 120/77, pulse 87, temperature 98.1 F (36.7 C), temperature source Oral, resp. rate 18, height 5\' 9"  (1.753 m), weight 57.6 kg, SpO2 100  %.  Medical Problem List and Plan: 1. Functional deficits secondary to secondary progressive multiple sclerosis with spasticity and uncontrolled pain- and need to titration up of ITB pump - pt's level of function has been declining for last few months- and now is max-total A             -patient may  shower             -ELOS/Goals: min Assist 14-18 days  10/18- First day of evaluations- con't CIR- PT < OT and SLP 2.  Antithrombotics: -DVT/anticoagulation:  Pharmaceutical: Lovenox             -antiplatelet therapy: none 3. Pain/spasticity management:  -baclofen IT pump- will titrate while here             -baclofen 40 mg-  -oxycodone 15 mg - will increase to q4 hours prn for therapy.  -dantrolene 50 mg -tizanidine 4 mg 10/18- will add Baclofen 20 mg daily prn for during day when spasticity worse; also d/c ibuprofen and add Aleve 500 mg BID- at 6am/6pm 4. Mood/Behavior/Sleep: LCSW to evaluate and provide emotional support             -continue Wellbutrin XL 300 mg daily             -continue Lamictal 25 mg 2 tabs BID             -antipsychotic agents: n/a 5. Neuropsych/cognition: This patient is capable of making decisions on her own behalf. 6. Skin/Wound Care: Routine skin care checks 7. Fluids/Electrolytes/Nutrition: Routine Is and Os and follow-up chemistries             -continue vitamin supplements (C, D3, B12) 8: GERD: continue Zantac 9: Neurogenic bladder: continue Foley catheter- last changed 03/01/22- will need to be changed prior to d/c.  10: Bowel program: continue psyllium, Miralax, Senekot 11: Edema: continue Lasix 40 mg daily 12: Season allergies: continue Afrin PRN, Singulair daily 13: Post-menopausal: continue Premarin vaginal cream  14. Dysphagia/resp issues- will get CXR and Slp eval for swallowing issues- had been on Nectar thick liquids prior.   15. Tooth abscess- con't Flagyl- finish Friday 250 mg TID.   16. Hypokalemia  10/18- K+ 3.3- will  replete 40 mEq x1 and  then put on 10 mEq daily since on Lasix.       I spent a total of 50   minutes on total care today- >50% coordination of care- due to 40 minutes with pt and nursing directly- 40 minutes in room with pt- 5 minutes with nursing about med changes and 5+ minutes writing note   LOS: 1 days A FACE TO FACE EVALUATION WAS PERFORMED  Lorenz Donley 03/14/2022, 8:43 AM

## 2022-03-15 DIAGNOSIS — G35 Multiple sclerosis: Secondary | ICD-10-CM | POA: Diagnosis not present

## 2022-03-15 MED ORDER — SENNOSIDES-DOCUSATE SODIUM 8.6-50 MG PO TABS
3.0000 | ORAL_TABLET | Freq: Every day | ORAL | Status: DC
  Administered 2022-03-16 – 2022-03-17 (×2): 3 via ORAL
  Filled 2022-03-15 (×2): qty 3

## 2022-03-15 MED ORDER — METRONIDAZOLE 250 MG PO TABS
250.0000 mg | ORAL_TABLET | Freq: Three times a day (TID) | ORAL | Status: AC
  Administered 2022-03-15 – 2022-03-16 (×5): 250 mg via ORAL
  Filled 2022-03-15 (×6): qty 1

## 2022-03-15 MED ORDER — POLYETHYLENE GLYCOL 3350 17 G PO PACK
17.0000 g | PACK | Freq: Every day | ORAL | Status: DC
  Administered 2022-03-15 – 2022-03-27 (×12): 17 g via ORAL
  Filled 2022-03-15 (×13): qty 1

## 2022-03-15 NOTE — Progress Notes (Incomplete)
{  CHL IP RHB ALL TR [id number]}

## 2022-03-15 NOTE — Group Note (Deleted)
Patient Details Name: Sharon Odom MRN: [id number] DOB: [birth year] Today's Date: 03/15/2022  Time Calculation:        Group Description: Stress management: Pt participated in group session with a focus on stress mgmt, education provided on healthy coping strategies, and social interaction. Focus of session on providing coping strategies to manage new diagnosis to allow for improved mental health to increase overall quality of life . Discussed how to break down stressors into "daily hassles," "major life stressors" and "life circumstances" in an effort to allow pts to chunk their stressors into groups and determine where to best put their efforts/time when dealing with stress. Provided active listening, emotional support and therapeutic use of self. Offered education on factors that protect [place] against stress such as "daily uplifts," "healthy coping strategies" and "protective factors." Encouraged all group members to make an effort to actively recall one event from their day that was a daily uplift in an effort to protect their mindset from stressors as well as sharing this information with their caregivers to facilitate improved caregiver communication and decrease overall burden of care.  Issued pt handouts on healthy coping strategies to implement into routine.   Individual level documentation: Patient participated with {Patient Participation:[id number]} collaboration during session.   Pain: Pain Assessment Pain Scale: 0-10 Pain Score: 10-Worst pain ever Pain Location: Generalized Pain Intervention(s): Medication (See eMAR)  Precautions:    Corinne Ports [facility] 03/15/2022, 3:50 PM

## 2022-03-15 NOTE — Group Note (Signed)
Patient Details Name: Deniese Oberry MRN: [id number] DOB: February 01, 1971 Today's Date: 03/15/2022  Time:  1105-12  Group Description: Stress management: Pt participated in group session with a focus on stress mgmt, education provided on healthy coping strategies, and social interaction. Focus of session on providing coping strategies to manage new diagnosis to allow for improved mental health to increase overall quality of life . Discussed how to break down stressors into "daily hassles," "major life stressors" and "life circumstances" in an effort to allow pts to chunk their stressors into groups and determine where to best put their efforts/time when dealing with stress. Provided active listening, emotional support and therapeutic use of self. Offered education on factors that protect [place] against stress such as "daily uplifts," "healthy coping strategies" and "protective factors." Encouraged all group members to make an effort to actively recall one event from their day that was a daily uplift in an effort to protect their mindset from stressors as well as sharing this information with their caregivers to facilitate improved caregiver communication and decrease overall burden of care.  Issued pt handouts on healthy coping strategies to implement into routine.   Individual level documentation: Patient participated with full collaboration during session.   Pain: c/o of buttocks discomfort, unated,  repositioning with power chair functions assisted with releif Pain Assessment Pain Scale: 0-10 Pain Score: 10-Worst pain ever Pain Location: Generalized Pain Intervention(s): Medication (See eMAR)    Craig Wisnewski 03/15/2022, 3:06 PM

## 2022-03-15 NOTE — Evaluation (Signed)
Recreational Therapy Assessment and Plan  Patient Details  Name: Sharon Odom MRN: [id number] Date of Birth: [birth year] Today's Date: 03/15/2022  Rehab Potential:  Fair ELOS:   7-10 days  Assessment   Hospital Problem: Principal Problem:   Multiple sclerosis ([place]) Active Problems:   S/P insertion of intrathecal pump     Past Medical History:      Past Medical History:  Diagnosis Date   Allergies     Anxiety     Arthritis     Asthma      seasonal - pollen   Constipation     Depression     Dyspnea      with exertion   GERD (gastroesophageal reflux disease)     Headache     History of hiatal hernia     Left radial nerve palsy 03/15/2020    resolved - Left arm/hand weak   Multiple sclerosis ([place])     Pneumonia 2007    x 1   Vision abnormalities     Wears glasses      Past Surgical History:       Past Surgical History:  Procedure Laterality Date   COLONOSCOPY       EXCISION MORTON'S NEUROMA Right     fallopian tube removal       INTRATHECAL PUMP IMPLANT Right 02/18/2019    Procedure: INTRATHECAL PUMP IMPLANT;  Surgeon: Clydell Hakim, MD;  Location: [facility];  Service: Neurosurgery;  Laterality: Right;  INTRATHECAL PUMP IMPLANT   INTRATHECAL PUMP IMPLANT Right 02/17/2021    Procedure: Baclofen Pump Replacement;  Surgeon: Erline Levine, MD;  Location: [facility] [place];  Service: Neurosurgery;  Laterality: Right;  3C/RM 21   INTRATHECAL PUMP REVISION N/A 01/11/2022    Procedure: Baclofen Pump revision;  Surgeon: Dawley, Theodoro Doing, DO;  Location: [facility];  Service: Neurosurgery;  Laterality: N/A;  RM 21 to follow   PAIN PUMP IMPLANTATION N/A 09/28/2021    Procedure: Insertion of baclofen pump, right lower quadrant;  Surgeon: Dawley, Theodoro Doing, DO;  Location: [facility];  Service: Neurosurgery;  Laterality: N/A;   PAIN PUMP REMOVAL Left 04/06/2021    Procedure: Removal of baclofen pump, Left;  Surgeon: Dawley, Theodoro Doing, DO;  Location: [facility];  Service: Neurosurgery;  Laterality: Left;   Lateral/Left//3C rm 19   UPPER GI ENDOSCOPY       WISDOM TOOTH EXTRACTION       WISDOM TOOTH EXTRACTION          Assessment & Plan Clinical Impression: Patient is a 51 y.o. year old female with a history of secondary progressive multiple sclerosis. She has had significant left greater than right spasticity with subsequent implantation of intrathecal pump for baclofen administration. She underwent revision of the abdominal portion of the pump by Dr. Reatha Armour on 01/11/2022. At the time of follow up on 10/6, she reported worsening pain especially when sitting despite increase in 5% increase in baclofen pump. She has been taking oral baclofen 20 mg six/day, oxycodone 10 mg six/day and on Dantrolene 100 mg BID. Continues on bowel regimen of Miralax, Metamucil and Senekot.  She has significant hypophonia, neurogenic bladder with indwelling Foley catheter. She is tolerating regular diet with thin liquids and taking medications crushed in applesauce.  She had limited mobility and uses a power wheelchair. Sleeps in chair or on couch. She can drive WC into handicap shower and transfers to shower bench. She uses a portable BSC transfers for bowel program. She has a vertical power lift  in her garage to from from power WC to car. She uses a manual WC in the community for appointments. He husband lifts her onto couch, into car, etc as she has not stood for many months. The patient requires inpatient physical medicine and rehabilitation evaluations and treatment secondary to dysfunction due to secondary progressive multiple sclerosis.  Patient transferred to CIR on 03/13/2022.    Pt presents with decreased activity tolerance, decreased functional mobility, decreased balance, decreased coordination, feelings of stress Limiting pt's independence with leisure/community pursuits.  Met with pt today to discuss TR services including leisure education, activity analysis/modifications and stress management.  Also discussed the  importance of social, emotional, spiritual health in addition to physical health and their effects on overall health and wellness.  Pt stated understanding.  Plan  Min 1 TR session >20 minutes during LOS  Recommendations for other services: Neuropsych  Discharge Criteria: Patient will be discharged from TR if patient refuses treatment 3 consecutive times without medical reason.  If treatment goals not met, if there is a change in medical status, if patient makes no progress towards goals or if patient is discharged from hospital.  The above assessment, treatment plan, treatment alternatives and goals were discussed and mutually agreed upon: by patient  [place] 03/15/2022, 2:10 PM

## 2022-03-15 NOTE — Group Note (Signed)
Patient Details Name: Sharon Odom MRN: [id number] DOB: 1971-04-11 Today's Date: 03/15/2022  Time Calculation: OT Group Time Calculation OT Group Start Time: 1105 OT Group Stop Time: 0086 OT Group Time Calculation (min): 60 min      Group Description: Stress management: Pt participated in group session with a focus on stress mgmt, education provided on healthy coping strategies, and social interaction. Focus of session on providing coping strategies to manage new diagnosis to allow for improved mental health to increase overall quality of life . Discussed how to break down stressors into "daily hassles," "major life stressors" and "life circumstances" in an effort to allow pts to chunk their stressors into groups and determine where to best put their efforts/time when dealing with stress. Provided active listening, emotional support and therapeutic use of self. Offered education on factors that protect [place] against stress such as "daily uplifts," "healthy coping strategies" and "protective factors." Encouraged all group members to make an effort to actively recall one event from their day that was a daily uplift in an effort to protect their mindset from stressors as well as sharing this information with their caregivers to facilitate improved caregiver communication and decrease overall burden of care.  Issued pt handouts on healthy coping strategies to implement into routine.   Individual level documentation: Patient participated with full collaboration during session.   Pain: Pain Assessment Pain Scale: 0-10 Pain Score: 10-Worst pain ever Pain Location: Generalized Pain Intervention(s): Medication (See eMAR)      Corinne Ports [facility] 03/15/2022, 3:53 PM

## 2022-03-15 NOTE — Progress Notes (Signed)
PROGRESS NOTE   Subjective/Complaints:   Pt said hurting this AM- asking when got AM m3eds- got a 3am, so is due- will have nursing get for her.   Has therapy at 8am.  Therapy went wll yesterday- wants ITB pump titrated up today as we agreed.   Wants senna increased to 3 tabs/day and miralax added back.   ROS:   Pt denies SOB, abd pain, CP, N/V/C/D, and vision changes Except for HPI   Objective:   DG Chest Port 1 View  Result Date: 03/13/2022 CLINICAL DATA:  [facility] EXAM: PORTABLE CHEST 1 VIEW COMPARISON:  06/18/2021 FINDINGS: The heart size and mediastinal contours are within normal limits. Both lungs are clear. Old right-sided rib fractures. Punctate square shaped metallic density overlying the fifth vertebral body. IMPRESSION: No active disease. Punctate metallic density overlying the mid mediastinal region is indeterminate for artifact or foreign body, consider two-view chest follow-up for further assessment. Electronically Signed   By: Jasmine Pang M.D.   On: 03/13/2022 16:10   Recent Labs    03/13/22 1543  WBC 6.6  HGB 11.7*  HCT 36.0  PLT 267   Recent Labs    03/13/22 1543  NA 140  K 3.3*  CL 102  CO2 31  GLUCOSE 99  BUN 7  CREATININE 0.71  CALCIUM 9.6    Intake/Output Summary (Last 24 hours) at 03/15/2022 5697 Last data filed at 03/15/2022 0819 Gross per 24 hour  Intake 476 ml  Output 2200 ml  Net -1724 ml        Physical Exam: Vital Signs Blood pressure 101/61, pulse 77, temperature 98 F (36.7 C), temperature source Oral, resp. rate 20, height 5\' 9"  (1.753 m), weight 57.6 kg, SpO2 100 %.    General: awake, alert, appropriate, sitting up in bed initially then in w/c- leans to Left; NAD HENT: conjugate gaze; oropharynx moist- dysarthria/dysphonia CV: regular rate; no JVD Pulmonary: CTA B/L; no W/R/R- good air movement GI: soft, NT, ND, (+)BS- ITB in place Psychiatric:  appropriate Neurological: Ox3 - has 5 beats clonus LLE  GU: leg bag from foley almost full Extremities: 2+ RLE ankle/lower calf/foot swelling- pitting MS: RUE 5-/5 except FA 4+/5 LUE 4-/5 throughout RLE- 4-/5;  LLE- HF 1/5; KE 2-/5; otherwise 1/5 Neuro: Low tone LUE RUE- normal tone RLE MAS of 2-3 with significant extensor tone with simple ROM LLE MAS 1+ No hoffman's B/L Few beats clonus on LLE but 5-7 beats RLE Psych: interactive; slightly flat affect; slightly depressed      Assessment/Plan: 1. Functional deficits which require 3+ hours per day of interdisciplinary therapy in a comprehensive inpatient rehab setting. Physiatrist is providing close team supervision and 24 hour management of active medical problems listed below. Physiatrist and rehab team continue to assess barriers to discharge/monitor patient progress toward functional and medical goals  Care Tool:  Bathing  Bathing activity did not occur: Refused           Bathing assist       Upper Body Dressing/Undressing Upper body dressing   What is the patient wearing?:  (sweater)    Upper body assist Assist Level: Maximal Assistance - Patient 25 -  49%    Lower Body Dressing/Undressing Lower body dressing            Lower body assist Assist for lower body dressing: Maximal Assistance - Patient 25 - 49%     Toileting Toileting    Toileting assist Assist for toileting: Moderate Assistance - Patient 50 - 74%     Transfers Chair/bed transfer  Transfers assist     Chair/bed transfer assist level: Moderate Assistance - Patient 50 - 74%     Locomotion Ambulation   Ambulation assist   Ambulation activity did not occur: Safety/medical concerns (unable to perform due to tone, strength, activity tolerance, and coordination deficits)          Walk 10 feet activity   Assist  Walk 10 feet activity did not occur: Safety/medical concerns (unable to perform due to tone, strength, activity  tolerance, and coordination deficits)        Walk 50 feet activity   Assist Walk 50 feet with 2 turns activity did not occur: Safety/medical concerns (unable to perform due to tone, strength, activity tolerance, and coordination deficits)         Walk 150 feet activity   Assist Walk 150 feet activity did not occur: Safety/medical concerns (unable to perform due to tone, strength, activity tolerance, and coordination deficits)         Walk 10 feet on uneven surface  activity   Assist Walk 10 feet on uneven surfaces activity did not occur: Safety/medical concerns (unable to perform due to tone, strength, activity tolerance, and coordination deficits)         Wheelchair     Assist Is the patient using a wheelchair?: Yes Type of Wheelchair: Power    Wheelchair assist level: Supervision/Verbal cueing Max wheelchair distance: 10 ft    Wheelchair 50 feet with 2 turns activity    Assist    Wheelchair 50 feet with 2 turns activity did not occur: Safety/medical concerns (unable to perform due to tone, strength, activity tolerance, and coordination deficits)       Wheelchair 150 feet activity     Assist  Wheelchair 150 feet activity did not occur: Safety/medical concerns (unable to perform due to tone, strength, activity tolerance, and coordination deficits)       Blood pressure 101/61, pulse 77, temperature 98 F (36.7 C), temperature source Oral, resp. rate 20, height 5\' 9"  (1.753 m), weight 57.6 kg, SpO2 100 %.  Medical Problem List and Plan: 1. Functional deficits secondary to secondary progressive multiple sclerosis with spasticity and uncontrolled pain- and need to titration up of ITB pump - pt's level of function has been declining for last few months- and now is max-total A             -patient may  shower             -ELOS/Goals: min Assist 14-18 days  10/19- Con't CIR- PT, OT and SLP 2.  Antithrombotics: -DVT/anticoagulation:   Pharmaceutical: Lovenox             -antiplatelet therapy: none 3. Pain/spasticity management:  -baclofen IT pump- will titrate while here             -baclofen 40 mg-  -oxycodone 15 mg - will increase to q4 hours prn for therapy.  -dantrolene 50 mg -tizanidine 4 mg 10/18- will add Baclofen 20 mg daily prn for during day when spasticity worse; also d/c ibuprofen and add Aleve 500 mg BID- at 6am/6pm 4. Mood/Behavior/Sleep:  LCSW to evaluate and provide emotional support             -continue Wellbutrin XL 300 mg daily             -continue Lamictal 25 mg 2 tabs BID             -antipsychotic agents: n/a 5. Neuropsych/cognition: This patient is capable of making decisions on her own behalf. 6. Skin/Wound Care: Routine skin care checks 7. Fluids/Electrolytes/Nutrition: Routine Is and Os and follow-up chemistries             -continue vitamin supplements (C, D3, B12) 8: GERD: continue Zantac 9: Neurogenic bladder: continue Foley catheter- last changed 03/01/22- will need to be changed prior to d/c.  10: Bowel program: continue psyllium, Miralax, Senekot  101/9- increased senna to 3 tabs daily and added miralax daily.  11: Edema: continue Lasix 40 mg daily 12: Season allergies: continue Afrin PRN, Singulair daily 13: Post-menopausal: continue Premarin vaginal cream  14. Dysphagia/resp issues- will get CXR and Slp eval for swallowing issues- had been on Nectar thick liquids prior.   15. Tooth abscess- con't Flagyl- finish Friday 250 mg TID.   16. Hypokalemia  10/18- K+ 3.3- will replete 40 mEq x1 and then put on 10 mEq daily since on Lasix.  17. ITB pump/Spasticity  10/19- will increase ITB pump today- increased from 386 mcg/day to 425 mcg/day- depending on her results, will see when to titrate her up next?    I spent a total of  52  minutes on total care today- >50% coordination of care- due to seeing pt twice, titrating up ITB pump and d/w Ptabout if pt can be in w/c in room on own to move  around room.    LOS: 2 days A FACE TO FACE EVALUATION WAS PERFORMED  Sutton Plake 03/15/2022, 9:22 AM

## 2022-03-15 NOTE — Progress Notes (Addendum)
Speech Language Pathology Daily Session Note  Patient Details  Name: Sharon Odom MRN: [id number] Date of Birth: April 06, 1971  Today's Date: 03/15/2022 SLP Individual Time: 1015-1100 SLP Individual Time Calculation (min): 45 min  Short Term Goals: Week 1: SLP Short Term Goal 1 (Week 1): STG=LTG due to short ELOS  Skilled Therapeutic Interventions: S: Pt seen this date for skilled ST intervention targeting swallowing goals outlined in care plan. Pt received awake/alert and OOB in power wheelchair. Agreeable to ST intervention in hospital room. Pleasant and participatory throughout. Intermittent wet vocal quality noted in the absence of PO.  O: SLP facilitated today's session by providing skilled observation of current diet textures (regular textures and thin liquids); the following observations noted: - Intermittent wet vocal quality, and one instance of coughing in the absence of PO trials; ? If this is reflux related given hx of significant GERD (currently on PPI for management). - During PO trials of thin liquid and solid textures, pt did not exhibit any overt s/sx concerning for airway compromise and with swallow initiation appearing relatively timely; per chart review, vital signs remain stable. With intake of solid textures, mild oral deficits noted in mastication, oral manipulation, and bolus cohesion. As a result, post-swallow residuals were noted on L > R, which pt cleared with lingual sweep and multiple liquid rinses; pt appeared sensate to residuals.  - Endorses good oral hygiene (brushes 2-3 times a day) and slow rate of intake, which was observed during today's session. Denies ever requiring the Heimlich maneuver. Denies recent hx of PNA since switching back to thin liquids ~6 months ago; was on IDDSI Level 2 and 1 prior. Pt reports she "put herself" back on thin liquids with no guidance from OP or [place] SLP.  A: Pt appears sitmulable for skilled ST intervention. Recommend proceeding with  instrumental swallow assessment next date to r/o silent aspiration, given hx of this prior to admission.  P: Pt left in room with direct hand off to rehab tech for transport to therapy group. Continue per current ST POC next session.   Pain None/Denies; NAD  Therapy/Group: Individual Therapy  Jaydi Bray A Loveda Colaizzi 03/15/2022, 12:42 PM

## 2022-03-15 NOTE — Progress Notes (Signed)
Physical Therapy Session Note  Patient Details  Name: Sharon Odom MRN: [id number] Date of Birth: 02/03/1971  Today's Date: 03/15/2022 PT Individual Time: 1405-1514   69 min   Short Term Goals: Week 1:  PT Short Term Goal 1 (Week 1): STG=LTG due ELOS   Skilled Therapeutic Interventions/Progress Updates:  Pt received sitting in WC and agreeable to PT. Pt performed repositioning in WC with min assist from PT to scooting to back of seat in full yreclined positioned. Forward weight shift to place pillow under sacrum. Lateral leaning R and L to improve pressure relief. WC mobility through hall 2 x 115ft to and from gym with cues for WC propulsion controls to improve speed in hall. Sit<>stand in parallel bars x 5 with mod assist to achieve full trunk extension and knee extension on the LLE due to poor quad activation. Pregait stepping in parallel bars 2 x 3 BLE with total A for LLE management to prevent buckling on the LLE and for advacement. Pt returned to room and performed squat pivot transfer to bed x 2 with min-mod assist and cues for BLE position. Sit>supine completed with max assist for safety, and left supine in bed with call bell in reach and all needs met.        Therapy Documentation Precautions:  Precautions Precautions: Fall Restrictions Weight Bearing Restrictions: No    Pain: Pain Assessment Pain Scale: 0-10 Pain Score: 10-Worst pain ever Pain Location: Generalized Pain Intervention(s): Medication (See eMAR)   Therapy/Group: Individual Therapy  Lorie Phenix 03/15/2022, 2:07 PM

## 2022-03-15 NOTE — Progress Notes (Signed)
Occupational Therapy Session Note  Patient Details  Name: Sharon Odom MRN: [id number] Date of Birth: [birth year]  Today's Date: 03/15/2022 OT Individual Time: 4403-4742 OT Individual Time Calculation (min): 45 min    Short Term Goals: Week 1:  OT Short Term Goal 1 (Week 1): STG=LTG d/t ELOS  Skilled Therapeutic Interventions/Progress Updates:  Pt seen for skilled OT session this am. MAS performed for tracking spasticity during rehab stay and conducted in bed in supine. Pt open to all am self care and OT assisted with set up of am routine for simple sponge bathing, transfer to EOB for UB and LB dressing. Increased time and effort required d/t motor execution and planning especially with L side. Pt reported some pain in buttocks region and in pump site in L abdomen 2/10 during session. Pt required min A UB pullover shirt and sweatshirt, set up for deodorant management with R UE due to L UE weakness, and mod A to don panties, pants and manage catheter through pant leg. Pt able to transfer to power w/c with mod A. NT [name] arrivaed and OT left pt in the care of NT due to scheduled time with the next pt.     Therapy/Group: Individual Therapy  Barnabas Lister   03/15/22 1031  LUE Tone  LUE Tone Modified [place]  Body Part - Modified [place] Scale Elbow;Wrist;Fingers;Thumb  Modified [place] Scale for Grading Hypertonia LUE 4  Elbow - Modified [place] Scale for Grading Hypertonia LUE 4  Wrist - Modified [place] Scale for Grading Hypertonia LUE 0  Thumb - Modified [place] Scale for Grading Hypertonia LUE 0    03/15/22 1030  RUE Tone  RUE Tone Modified [place]  Body Part - Modified [place] Scale Elbow;Wrist;Fingers;Thumb  Modified [place] Scale for Grading Hypertonia RUE 1  Elbow - Modified [place] Scale for Grading Hypertonia RUE 1  Wrist - Modified [place] Scale for Grading Hypertonia RUE 0  Fingers - Modified [place] Scale for Grading Hypertonia RUE 0  Thumb - Modified  [place] Scale for Grading Hypertonia RUE 0  , 7:51 AM

## 2022-03-16 ENCOUNTER — Inpatient Hospital Stay (HOSPITAL_COMMUNITY): Payer: BLUE CROSS/BLUE SHIELD

## 2022-03-16 DIAGNOSIS — G35 Multiple sclerosis: Secondary | ICD-10-CM | POA: Diagnosis not present

## 2022-03-16 NOTE — Progress Notes (Signed)
Physical Therapy Session Note  Patient Details  Name: Sharon Odom MRN: [id number] Date of Birth: 03/24/1971  Today's Date: 03/16/2022 PT Individual Time: 9169-4503 PT Individual Time Calculation (min): 43 min   Short Term Goals: Week 1:  PT Short Term Goal 1 (Week 1): STG=LTG due ELOS  Skilled Therapeutic Interventions/Progress Updates:    Pt received in [place], agreeable to therapy. Pt reports pain only during MAS testing with spasticity. Pt assisted with repositioning in chair x 2 during session using Vina functions and min A from therapist to manage LLE. Pt sits with slumped posture toward L side, which she is not able to actively correct and quickly returns to when assisted to more upright position. Pt hyperverbal during session. Therapist performed MAS in reclined [place] as documented in flow sheet. Noted RLE pulling into extension, adduction, internal rotation pattern after testing hamstrings. Pt drove chair to ortho gym with supervision and performed Sit to stand to RW with mod A for full upright position. Pt with extended time for all activities this session. Pt returned to room and was set up with lunch tray with therapist assisting with positioning with pillows and donning leg warmers. Pt remained in [place] with all needs in reach.  Therapy Documentation Precautions:  Precautions Precautions: Fall Restrictions Weight Bearing Restrictions: No General:       Therapy/Group: Individual Therapy  Mickel Fuchs 03/16/2022, 3:49 PM

## 2022-03-16 NOTE — Progress Notes (Signed)
PROGRESS NOTE   Subjective/Complaints:  Had a lot of spasms yesterday afternoon- hard to tell if better with increase in ITB since still in bed.   Wants ot research methadone for pain. D/w OT about alarm not attached when in w/c- so can move around room.   ROS:  Pt denies SOB, abd pain, CP, N/V/C/D, and vision changes  Except for HPI   Objective:   No results found. Recent Labs    03/13/22 1543  WBC 6.6  HGB 11.7*  HCT 36.0  PLT 267   Recent Labs    03/13/22 1543  NA 140  K 3.3*  CL 102  CO2 31  GLUCOSE 99  BUN 7  CREATININE 0.71  CALCIUM 9.6    Intake/Output Summary (Last 24 hours) at 03/16/2022 1234 Last data filed at 03/16/2022 0743 Gross per 24 hour  Intake 826 ml  Output 500 ml  Net 326 ml        Physical Exam: Vital Signs Blood pressure 103/60, pulse 70, temperature 98 F (36.7 C), temperature source Oral, resp. rate 18, height 5\' 9"  (1.753 m), weight 57.6 kg, SpO2 97 %.     General: awake, alert, appropriate, supine in bed; leans to left even then; NAD HENT: conjugate gaze; oropharynx moist CV: regular rate; no JVD Pulmonary: CTA B/L; no W/R/R- good air movement GI: soft, NT, ND, (+)BS- ITB in abdomen Psychiatric: appropriate Neurological: Ox3 - has 5 beats clonus LLE  GU: leg bag from foley almost full Extremities: 2+ RLE ankle/lower calf/foot swelling- pitting MS: RUE 5-/5 except FA 4+/5 LUE 4-/5 throughout RLE- 4-/5;  LLE- HF 1/5; KE 2-/5; otherwise 1/5 Neuro: Low tone LUE RUE- normal tone RLE MAS of 2-3 with significant extensor tone with simple ROM LLE MAS 1+ No hoffman's B/L Few beats clonus on LLE but 5-7 beats RLE Psych: interactive; slightly flat affect; slightly depressed      Assessment/Plan: 1. Functional deficits which require 3+ hours per day of interdisciplinary therapy in a comprehensive inpatient rehab setting. Physiatrist is providing close team  supervision and 24 hour management of active medical problems listed below. Physiatrist and rehab team continue to assess barriers to discharge/monitor patient progress toward functional and medical goals  Care Tool:  Bathing  Bathing activity did not occur: Refused Body parts bathed by patient: Right arm, Left arm, Chest, Abdomen, Front perineal area, Buttocks, Right upper leg, Left upper leg, Face   Body parts bathed by helper: Right lower leg, Left lower leg     Bathing assist Assist Level: Moderate Assistance - Patient 50 - 74%     Upper Body Dressing/Undressing Upper body dressing   What is the patient wearing?: Pull over shirt, Bra    Upper body assist Assist Level: Moderate Assistance - Patient 50 - 74%    Lower Body Dressing/Undressing Lower body dressing      What is the patient wearing?: Pants, Underwear/pull up     Lower body assist Assist for lower body dressing: Maximal Assistance - Patient 25 - 49%     Toileting Toileting    Toileting assist Assist for toileting: Moderate Assistance - Patient 50 - 74%  Transfers Chair/bed transfer  Transfers assist     Chair/bed transfer assist level: Moderate Assistance - Patient 50 - 74%     Locomotion Ambulation   Ambulation assist   Ambulation activity did not occur: Safety/medical concerns (unable to perform due to tone, strength, activity tolerance, and coordination deficits)          Walk 10 feet activity   Assist  Walk 10 feet activity did not occur: Safety/medical concerns (unable to perform due to tone, strength, activity tolerance, and coordination deficits)        Walk 50 feet activity   Assist Walk 50 feet with 2 turns activity did not occur: Safety/medical concerns (unable to perform due to tone, strength, activity tolerance, and coordination deficits)         Walk 150 feet activity   Assist Walk 150 feet activity did not occur: Safety/medical concerns (unable to perform due  to tone, strength, activity tolerance, and coordination deficits)         Walk 10 feet on uneven surface  activity   Assist Walk 10 feet on uneven surfaces activity did not occur: Safety/medical concerns (unable to perform due to tone, strength, activity tolerance, and coordination deficits)         Wheelchair     Assist Is the patient using a wheelchair?: Yes Type of Wheelchair: Power    Wheelchair assist level: Supervision/Verbal cueing Max wheelchair distance: 10 ft    Wheelchair 50 feet with 2 turns activity    Assist    Wheelchair 50 feet with 2 turns activity did not occur: Safety/medical concerns (unable to perform due to tone, strength, activity tolerance, and coordination deficits)       Wheelchair 150 feet activity     Assist  Wheelchair 150 feet activity did not occur: Safety/medical concerns (unable to perform due to tone, strength, activity tolerance, and coordination deficits)       Blood pressure 103/60, pulse 70, temperature 98 F (36.7 C), temperature source Oral, resp. rate 18, height 5\' 9"  (1.753 m), weight 57.6 kg, SpO2 97 %.  Medical Problem List and Plan: 1. Functional deficits secondary to secondary progressive multiple sclerosis with spasticity and uncontrolled pain- and need to titration up of ITB pump - pt's level of function has been declining for last few months- and now is max-total A             -patient may  shower             -ELOS/Goals: min Assist 14-18 days  10/.20- con't CIR- PT, OT and SLP- MBS today 2.  Antithrombotics: -DVT/anticoagulation:  Pharmaceutical: Lovenox             -antiplatelet therapy: none 3. Pain/spasticity management:  -baclofen IT pump- will titrate while here             -baclofen 40 mg-  -oxycodone 15 mg - will increase to q4 hours prn for therapy.  -dantrolene 50 mg -tizanidine 4 mg 10/18- will add Baclofen 20 mg daily prn for during day when spasticity worse; also d/c ibuprofen and add  Aleve 500 mg BID- at 6am/6pm 4. Mood/Behavior/Sleep: LCSW to evaluate and provide emotional support             -continue Wellbutrin XL 300 mg daily             -continue Lamictal 25 mg 2 tabs BID             -antipsychotic agents: n/a  5. Neuropsych/cognition: This patient is capable of making decisions on her own behalf. 6. Skin/Wound Care: Routine skin care checks 7. Fluids/Electrolytes/Nutrition: Routine Is and Os and follow-up chemistries             -continue vitamin supplements (C, D3, B12) 8: GERD: continue Zantac 9: Neurogenic bladder: continue Foley catheter- last changed 03/01/22- will need to be changed prior to d/c.  10: Bowel program: continue psyllium, Miralax, Senekot  101/9- increased senna to 3 tabs daily and added miralax daily.  11: Edema: continue Lasix 40 mg daily 12: Season allergies: continue Afrin PRN, Singulair daily 13: Post-menopausal: continue Premarin vaginal cream  14. Dysphagia/resp issues- will get CXR and Slp eval for swallowing issues- had been on Nectar thick liquids prior.   10/20- MBS today  15. Tooth abscess- con't Flagyl- finish Friday 250 mg TID.   16. Hypokalemia  10/18- K+ 3.3- will replete 40 mEq x1 and then put on 10 mEq daily since on Lasix.  17. ITB pump/Spasticity  10/19- will increase ITB pump today- increased from 386 mcg/day to 425 mcg/day- depending on her results, will see when to titrate her up next?   10/20- since still in bed; hasn't felt if better yet- will recheck in AM  I spent a total of 40   minutes on total care today- >50% coordination of care- due to d/w nursing manager- IPOC and d/w therapy about being up in w/c in room alone   LOS: 3 days A FACE TO FACE EVALUATION WAS PERFORMED  Yehudah Standing 03/16/2022, 12:34 PM

## 2022-03-16 NOTE — IPOC Note (Signed)
Overall Plan of Care [facility]) Patient Details Name: Sharon Odom MRN: [id number] DOB: [birth year]  Admitting Diagnosis: Multiple sclerosis [facility])  Hospital Problems: Principal Problem:   Multiple sclerosis ([place]) Active Problems:   S/P insertion of intrathecal pump     Functional Problem List: Nursing Bladder, Bowel, Pain, Safety, Edema, Endurance, Sensory, Medication Management, Skin Integrity, Motor  PT Balance, Edema, Endurance, Motor, Nutrition, Pain, Sensory  OT    SLP    TR         Basic ADL's: OT Grooming, Bathing, Toileting, Dressing     Advanced  ADL's: OT       Transfers: PT Bed Mobility, Bed to Chair, Car  OT Toilet, Tub/Shower     Locomotion: PT Ambulation, Wheelchair Mobility     Additional Impairments: OT Fuctional Use of Upper Extremity  SLP Swallowing      TR      Anticipated Outcomes Item Anticipated Outcome  Self Feeding no goal  Swallowing  Supervision A   Basic self-care  MOD A  Toileting  MIN A   Bathroom Transfers Supervision toilet; CGA shower  Bowel/Bladder  Neurogenic B/B with foley in place  Transfers  Min A  Locomotion  Mod A  Communication     Cognition     Pain  less than 3  Safety/Judgment  remain fall free while in rehab   Therapy Plan: PT Intensity: Minimum of 1-2 x/day ,45 to 90 minutes PT Frequency: 5 out of 7 days PT Duration Estimated Length of Stay: 7-10 days OT Intensity: Minimum of 1-2 x/day, 45 to 90 minutes OT Frequency: 5 out of 7 days OT Duration/Estimated Length of Stay: 7-10 days SLP Intensity: Minumum of 1-2 x/day, 30 to 90 minutes SLP Frequency: 3 to 5 out of 7 days SLP Duration/Estimated Length of Stay: 7-10 days   Team Interventions: Nursing Interventions Patient/Family Education, Disease Management/Prevention, Skin Care/Wound Management, Discharge Planning, Bladder Management, Pain Management, Psychosocial Support, Bowel Management, Medication Management  PT interventions Ambulation/gait  training, Discharge planning, Functional mobility training, Psychosocial support, Therapeutic Activities, Balance/vestibular training, Disease management/prevention, Neuromuscular re-education, Therapeutic Exercise, Wheelchair propulsion/positioning, DME/adaptive equipment instruction, Pain management, UE/LE Strength taining/ROM, Community reintegration, Barrister's clerk education, UE/LE Coordination activities  OT Interventions Training and development officer, Patient/family education, Therapeutic Exercise, Functional mobility training, Self Care/advanced ADL retraining, UE/LE Strength taining/ROM, UE/LE Coordination activities, Neuromuscular re-education, Discharge planning, Disease mangement/prevention, Pain management  SLP Interventions Dysphagia/aspiration precaution training, Patient/family education  TR Interventions    SW/CM Interventions Discharge Planning, Psychosocial Support, Patient/Family Education   Barriers to Discharge MD  Medical stability, Home enviroment access/loayout, Incontinence, Neurogenic bowel and bladder, and Lack of/limited family support  Nursing Decreased caregiver support, Neurogenic Bowel & Bladder, Wound Care, Weight bearing restrictions adjustment to baclofen pump medications  PT Neurogenic Bowel & Bladder, Decreased caregiver support, Lack of/limited family support decreased caregiver support as spouse works during day  Air cabin crew, Inaccessible home environment    SLP      SW Lack of/limited family support, Insurance underwriter for SNF coverage     Team Discharge Planning: Destination: PT-Home ,OT- Home , SLP-Home Projected Follow-up: PT-Home health PT, OT-  Home health OT, SLP-24 hour supervision/assistance Projected Equipment Needs: PT-To be determined, OT- None recommended by OT, SLP-None recommended by SLP Equipment Details: PT-pt owns PWC, manual w/c, BSC, rollator, OT-  Patient/family involved in discharge planning: PT- Patient,  OT-Patient,  SLP-Patient  MD ELOS: 7-10 days Medical Rehab Prognosis:  Good Assessment: The patient has been admitted for CIR therapies with  the diagnosis of ITB pump ins etting of MS worsening/progression. The team will be addressing functional mobility, strength, stamina, balance, safety, adaptive techniques and equipment, self-care, bowel and bladder mgt, patient and caregiver education, . Goals have been set at min A. Anticipated discharge destination is home.        See Team Conference Notes for weekly updates to the plan of care

## 2022-03-16 NOTE — Progress Notes (Addendum)
Modified Barium Swallow Progress Note  Patient Details  Name: Sharon Odom MRN: [id number] Date of Birth: Aug 16, 1970  Today's Date: 03/16/2022  Modified Barium Swallow completed.  Full report located under Chart Review in the Imaging Section.  Brief recommendations include the following:  Clinical Impression   Pt presents with mild oropharyngeal dysphagia, characterized by piecemeal swallow, premature spillage, and delay in swallow initiation of thin liquids; resulting in BOT and vallecula residue and (PAS 7) aspiration during the swallow of large sips of thin liquids. Pt was able to expel 1 out 2 aspirations noted with delayed cough. Pt presents with xerostomia impacting swallow initiation delay and possibly premature spillage, as well as ability to complete cued secondary swallows to reduce BOT and vallecula residue. Compensatory strategy of chin tuck was effective in protecting airway and reducing initial pharyngeal residue, however impeded pt from initiating secondary swallow to reduce residue from premature spillage of remaining oral residue. Pt required extended bolus preparation and swallow initiation time for dys 3 textures and demonstrated no s/s aspiration with use of a liquid wash/mixed consistencies. SLP recommends thin liquids via small sips cup/straw and regular textures foods with effortful and secondary swallow following thin liquids. Staff should continue to monitor vitals and monitor s/s of aspiration with h/o of PNA.   Swallow Evaluation Recommendations       SLP Diet Recommendations: Thin liquid;Regular solids   Liquid Administration via: Cup;Straw   Medication Administration: Whole meds with puree   Supervision: Patient able to self feed;Intermittent supervision to cue for compensatory strategies   Compensations: Minimize environmental distractions;Slow rate;Small sips/bites;Effortful swallow;Multiple dry swallows after each bite/sip   Postural Changes: Seated upright  at 90 degrees   Oral Care Recommendations: Oral care QID (xerostomia)        Orlin Kann 03/16/2022,11:37 AM

## 2022-03-16 NOTE — Progress Notes (Signed)
Physical Therapy Session Note  Patient Details  Name: Sharon Odom MRN: [id number] Date of Birth: 1970-05-31  Today's Date: 03/16/2022 PT Individual Time: 1133-1205 PT Individual Time Calculation (min): 32 min   Short Term Goals: Week 1:  PT Short Term Goal 1 (Week 1): STG=LTG due ELOS  Skilled Therapeutic Interventions/Progress Updates:   Pt received sitting in WC and agreeable to PT. Pt requesting to attempt tranfers with RW to simulate transfers performed at home with husband. Stand pivot transfer to and form bed x 2 each with mod-max assist to prevent LOB when L knee buckled in transfer. Pt required max cues for hip and trunk extension throughout transfer due to fatigue in postural muscles and pain in the R knee and hip. PT required to assist with forward limb advancement on the LLE, but pt able to complete posterior step with the LLE and increased time. PT educated pt on need for AFO or knee brace to prevent knee collapse. Pt reports that she has attempted AFO in the past and was not interested in AFO use. My benefit from locking knee brace. Repositioning in WC in fully reclined with mod assist from PT to improve pelvic rotation and reduce pressure to the L ischial tuberosity. Patient returned to room and left sitting in [facility] with call bell in reach and all needs met.         Therapy Documentation Precautions:  Precautions Precautions: Fall Restrictions Weight Bearing Restrictions: No  Vital Signs: Therapy Vitals Temp: 98.2 F (36.8 C) Pulse Rate: 82 Resp: 16 BP: 105/65 Patient Position (if appropriate): Sitting Oxygen Therapy SpO2: 96 % O2 Device: Room Air Pain: Pain Assessment Pain Scale: 0-10 Pain Score: 9  Faces Pain Scale: Hurts a little bit Pain Type: Chronic pain Pain Location: Teeth   Therapy/Group: Individual Therapy  Lorie Phenix 03/16/2022, 3:10 PM

## 2022-03-16 NOTE — Progress Notes (Signed)
Occupational Therapy Session Note  Patient Details  Name: Sharon Odom MRN: [id number] Date of Birth: 22-May-1971  Today's Date: 03/16/2022 OT Individual Time: 5732-2025 OT Individual Time Calculation (min): 47 min    Short Term Goals: Week 1:  OT Short Term Goal 1 (Week 1): STG=LTG d/t ELOS  Skilled Therapeutic Interventions/Progress Updates:  1st Session: RN secure chatted OT that pt needed to be completed with AM session to be picked up for MBS test at 845 am. OT arrived to session early and MD present completing her assessment as pt in bed finishing breakfast. Pain reported at 5/10 B LE's at rest in bed with LE's elevated on pillows. MAS testing completed with B Ue's with no change from previous day bed level in supine. Pt requesting to perform sink side UB bathing, full dressing and oral care prior to test. OT assisted with set up of am routine and OOB with modified SPT to R side to power w/c with mod A. Increased time and effort required d/t motor execution and planning especially with L side. n. Pt required increased time for fasterers of rear clasp bra turned to front then turned it to back with CGA as well as UB pullover tank top and sweatshirt, set up for deodorant and mod A to don panties, pants and manage catheter through pant leg. Max A for slipper socks. Pt was instructed by transport to return to bed and OT assisted with mod A back to bed to R side. NT Melanie assisted to take over to situate to be transported to test.   MAS    10//20/23  LUE Tone  LUE Tone Modified [place]  Body Part - Modified [place] Scale Elbow;Wrist;Fingers;Thumb  Modified [place] Scale for Grading Hypertonia LUE 4  Elbow - Modified [place] Scale for Grading Hypertonia LUE 4  Wrist - Modified [place] Scale for Grading Hypertonia LUE 0  Thumb - Modified [place] Scale for Grading Hypertonia LUE 0      03/16/22   RUE Tone  RUE Tone Modified [place]  Body Part - Modified [place] Scale  Elbow;Wrist;Fingers;Thumb  Modified [place] Scale for Grading Hypertonia RUE 1  Elbow - Modified [place] Scale for Grading Hypertonia RUE 1  Wrist - Modified [place] Scale for Grading Hypertonia RUE 0  Fingers - Modified [place] Scale for Grading Hypertonia RUE 0  Thumb - Modified [place] Scale for Grading Hypertonia RUE     2nd Session:  Pt up in w/c upon OT arrival for session and pt reported she passed her MBS test and can stay on thin liquids with small sips. Pt was being assessed by nursing student and requested OT assist with assessing and managing L IT pu. Pt required mod A for sit to stand with RW support, max A to manage pull down/up LB garments. OT also assessed pu and no drainage, no open area with minor reddened area only. OT provided peri cleansing and re-applied foam covering to ensure pressure relief to area. Pt stood 6 trials of 2 minutes for cares. Pt reported 8/10 distal LE pain and requested retrograde massage for relief as well as TED hose application. Pt reported relief to 4/10 following OT assisting with both. OT propped B LE's on pillows for elevation and left pt with nurse call button, lunch tray as she had not eaten, and needs in reach. MD has discontinued chair alarm in power w/c while in room.    Therapy Documentation Precautions:  Precautions Precautions: Fall Restrictions Weight Bearing Restrictions: No  Therapy/Group: Individual Therapy  Vicenta Dunning 03/16/2022, 7:52 AM

## 2022-03-17 DIAGNOSIS — G35 Multiple sclerosis: Secondary | ICD-10-CM | POA: Diagnosis not present

## 2022-03-17 MED ORDER — METHADONE HCL 5 MG PO TABS
2.5000 mg | ORAL_TABLET | Freq: Two times a day (BID) | ORAL | Status: DC
  Administered 2022-03-17 – 2022-03-18 (×4): 2.5 mg via ORAL
  Filled 2022-03-17 (×6): qty 1

## 2022-03-17 MED ORDER — SORBITOL 70 % SOLN
30.0000 mL | Freq: Once | Status: AC
  Administered 2022-03-17: 30 mL via ORAL
  Filled 2022-03-17: qty 30

## 2022-03-17 NOTE — Progress Notes (Signed)
PROGRESS NOTE   Subjective/Complaints:  Pt reports very upset- having fights with husband abotu amount of support she needs- he just 'wants her to get better".  Still same amount of stiffness since increase in ITB- Pain "horrible" Really constipated- LBM Monday-  Asking if supposed to continue Flagyl- explained it's done as of yesterday ROS:   Pt denies SOB, abd pain, CP, N/V/ (+)C/D, and vision changes  Except for HPI   Objective:   No results found. No results for input(s): "WBC", "HGB", "HCT", "PLT" in the last 72 hours.  No results for input(s): "NA", "K", "CL", "CO2", "GLUCOSE", "BUN", "CREATININE", "CALCIUM" in the last 72 hours.   Intake/Output Summary (Last 24 hours) at 03/17/2022 0941 Last data filed at 03/17/2022 0604 Gross per 24 hour  Intake 237 ml  Output 1750 ml  Net -1513 ml        Physical Exam: Vital Signs Blood pressure 97/64, pulse 80, temperature 98.5 F (36.9 C), temperature source Oral, resp. rate 16, height 5\' 9"  (1.753 m), weight 57.6 kg, SpO2 94 %.      General: awake, alert, appropriate, tearful at times- sitting up in bed; upset with husband; NAD HENT: conjugate gaze; oropharynx moist CV: regular rate; no JVD Pulmonary: CTA B/L; no W/R/R- good air movement GI: soft, NT, more distended than normal, (+)BS Psychiatric: appropriate- tearful at times Neurological: Ox3 - has 5 beats clonus LLE  GU: leg bag from foley almost full Extremities: 2+ RLE ankle/lower calf/foot swelling- pitting MS: RUE 5-/5 except FA 4+/5 LUE 4-/5 throughout RLE- 4-/5;  LLE- HF 1/5; KE 2-/5; otherwise 1/5 Neuro: Low tone LUE RUE- normal tone RLE MAS of 2-3 with significant extensor tone with simple ROM LLE MAS 1+ No hoffman's B/L Few beats clonus on LLE but 5-7 beats RLE Psych: interactive; slightly flat affect; slightly depressed      Assessment/Plan: 1. Functional deficits which require 3+  hours per day of interdisciplinary therapy in a comprehensive inpatient rehab setting. Physiatrist is providing close team supervision and 24 hour management of active medical problems listed below. Physiatrist and rehab team continue to assess barriers to discharge/monitor patient progress toward functional and medical goals  Care Tool:  Bathing  Bathing activity did not occur: Refused Body parts bathed by patient: Right arm, Left arm, Chest, Abdomen, Front perineal area, Buttocks, Right upper leg, Left upper leg, Face   Body parts bathed by helper: Right lower leg, Left lower leg     Bathing assist Assist Level: Moderate Assistance - Patient 50 - 74%     Upper Body Dressing/Undressing Upper body dressing   What is the patient wearing?: Pull over shirt, Bra    Upper body assist Assist Level: Minimal Assistance - Patient > 75%    Lower Body Dressing/Undressing Lower body dressing      What is the patient wearing?: Pants, Underwear/pull up     Lower body assist Assist for lower body dressing: Maximal Assistance - Patient 25 - 49%     Toileting Toileting    Toileting assist Assist for toileting: Moderate Assistance - Patient 50 - 74%     Transfers Chair/bed transfer  Transfers assist  Chair/bed transfer assist level: Moderate Assistance - Patient 50 - 74%     Locomotion Ambulation   Ambulation assist   Ambulation activity did not occur: Safety/medical concerns (unable to perform due to tone, strength, activity tolerance, and coordination deficits)          Walk 10 feet activity   Assist  Walk 10 feet activity did not occur: Safety/medical concerns (unable to perform due to tone, strength, activity tolerance, and coordination deficits)        Walk 50 feet activity   Assist Walk 50 feet with 2 turns activity did not occur: Safety/medical concerns (unable to perform due to tone, strength, activity tolerance, and coordination deficits)          Walk 150 feet activity   Assist Walk 150 feet activity did not occur: Safety/medical concerns (unable to perform due to tone, strength, activity tolerance, and coordination deficits)         Walk 10 feet on uneven surface  activity   Assist Walk 10 feet on uneven surfaces activity did not occur: Safety/medical concerns (unable to perform due to tone, strength, activity tolerance, and coordination deficits)         Wheelchair     Assist Is the patient using a wheelchair?: Yes Type of Wheelchair: Power    Wheelchair assist level: Supervision/Verbal cueing Max wheelchair distance: 10 ft    Wheelchair 50 feet with 2 turns activity    Assist    Wheelchair 50 feet with 2 turns activity did not occur: Safety/medical concerns (unable to perform due to tone, strength, activity tolerance, and coordination deficits)       Wheelchair 150 feet activity     Assist  Wheelchair 150 feet activity did not occur: Safety/medical concerns (unable to perform due to tone, strength, activity tolerance, and coordination deficits)       Blood pressure 97/64, pulse 80, temperature 98.5 F (36.9 C), temperature source Oral, resp. rate 16, height 5\' 9"  (1.753 m), weight 57.6 kg, SpO2 94 %.  Medical Problem List and Plan: 1. Functional deficits secondary to secondary progressive multiple sclerosis with spasticity and uncontrolled pain- and need to titration up of ITB pump - pt's level of function has been declining for last few months- and now is max-total A             -patient may  shower             -ELOS/Goals: min Assist 14-18 days  Con't CIR- PT, OT and SLP- of note, fighting with husband 2.  Antithrombotics: -DVT/anticoagulation:  Pharmaceutical: Lovenox             -antiplatelet therapy: none 3. Pain/spasticity management:  -baclofen IT pump- will titrate while here             -baclofen 40 mg-  -oxycodone 15 mg - will increase to q4 hours prn for therapy.   -dantrolene 50 mg -tizanidine 4 mg 10/18- will add Baclofen 20 mg daily prn for during day when spasticity worse; also d/c ibuprofen and add Aleve 500 mg BID- at 6am/6pm 4. Mood/Behavior/Sleep: LCSW to evaluate and provide emotional support             -continue Wellbutrin XL 300 mg daily             -continue Lamictal 25 mg 2 tabs BID             -antipsychotic agents: n/a 5. Neuropsych/cognition: This patient is capable of making  decisions on her own behalf. 6. Skin/Wound Care: Routine skin care checks 7. Fluids/Electrolytes/Nutrition: Routine Is and Os and follow-up chemistries             -continue vitamin supplements (C, D3, B12) 8: GERD: continue Zantac 9: Neurogenic bladder: continue Foley catheter- last changed 03/01/22- will need to be changed prior to d/c.  10: Bowel program: continue psyllium, Miralax, Sen[place]ot  101/9- increased senna to 3 tabs daily and added miralax daily.  10/21- no BM since admission- will give Sorbitol and SSE if needed after therapy today 11: Edema: continue Lasix 40 mg daily 12: Season allergies: continue Afrin PRN, Singulair daily 13: Post-menopausal: continue Premarin vaginal cream  14. Dysphagia/resp issues- will get CXR and Slp eval for swallowing issues- had been on Nectar thick liquids prior.   10/20- MBS today  10/21- can stay on thin liquids with effortful swallows and secondary swallow-   15. Tooth abscess- con't Flagyl- finish Friday 250 mg TID.   16. Hypokalemia  10/18- K+ 3.3- will replete 40 mEq x1 and then put on 10 mEq daily since on Lasix.  17. ITB pump/Spasticity  10/19- will increase ITB pump today- increased from 386 mcg/day to 425 mcg/day- depending on her results, will see when to titrate her up next?   10/20- since still in bed; hasn't felt if better yet- will recheck in AM  10/21- doesn't feel looser so far- will try to increase tomorrow   I spent a total of 35   minutes on total care today- >50% coordination of care- due to d/w  nursing about pt's Flagyl as well as prolonged time in room- pt upset that fighting with husband about support.    LOS: 4 days A FACE TO FACE EVALUATION WAS PERFORMED  Cambry Spampinato 03/17/2022, 9:41 AM

## 2022-03-17 NOTE — Progress Notes (Signed)
Occupational Therapy Session Note  Patient Details  Name: Sharon Odom MRN: [id number] Date of Birth: Oct 24, 1970  Today's Date: 03/17/2022 OT Individual Time: 1100-1205 OT Individual Time Calculation (min): 65 min    Short Term Goals: Week 1:  OT Short Term Goal 1 (Week 1): STG=LTG d/t ELOS  Skilled Therapeutic Interventions/Progress Updates:    Upon OT arrival, pt seated in [place] reporting 8/10 generalized pain and is agreeable to OT treatment. Treatment intervention with a focus on L UE use, L UE exercises, B LE exercises, coordination, functional transfers, sitting balance and activity tolerance. Pt transports herself via [place] to ortho gym independently and positions herself infront of [place] board. Chair was raised higher to improve ability to use the L UE during scanning task. Pt uses the L UE to tap each blue dot on screen in all planes. Pt initially with Min difficulty to complete but then requires active assist from the R UE to complete task secondary to fatigue. Pt hits 27 dots, in 3 minutes, with a 6.26 reaction time. Pt states "I like this one". Pt practices simulating a PWC to bed transfer and mat was heightened to same level as her bed at home. Pt transports herself next to mat and completes stand step transfer with RW and CGA. Pt sits EOM to completes LE exercises for 1x5 reps on the L LE and 3x10 reps on the R LE. Pt requires active assist to achieve knee flexion and knee extension as needed during exercise but has no difficulty with the R LE. Pt then performs isometrics to the L UE to improve becep activation per pt request. Pt completes 2x10 reps of elbow flexion for 5 second hold requiring verbal cuing to complete correctly. Pt sits EOM with CGA. Pt completes stand step transfer from mat to [facility] with CGA using RW and adjusts herself using controls on PWC. While seated in [place], pt retrieves bean bags from therapist in lower planes using the L UE facilitating tenodesis grasp to promote active  relapse of items. Pt transfers 8 bean bags into bucket located to L of pt and completes with mod difficulty. Pt transports herself to her room via w/c independently and was left in [place] with pillows positioned adequately and all needs met.   Therapy Documentation Precautions:  Precautions Precautions: Fall Restrictions Weight Bearing Restrictions: No    Therapy/Group: Individual Therapy  Marvetta Gibbons 03/17/2022, 12:23 PM

## 2022-03-17 NOTE — Progress Notes (Signed)
Physical Therapy Session Note  Patient Details  Name: Sharon Odom MRN: [id number] Date of Birth: 14-Mar-1971  Today's Date: 03/17/2022 PT Individual Time: 0815-0915 PT Individual Time Calculation (min): 60 min   Short Term Goals: Week 1:  PT Short Term Goal 1 (Week 1): STG=LTG due ELOS     Skilled Therapeutic Interventions/Progress Updates:  Pt had just received her tray and started eating when PT entered room.  Pt asked if she could eat before starting PT.  PT came back after 15 min and pt had eaten some of her breakfast, and was willing to participate.   Modified Ashworth scale: tested with pt in supine in bed: LLE grade 0, RLE grade 3; RLE rigid in extension, adduction, slight internal rotation.  Pt rolled L in bed with HOB slightly raised, CGA, and using bed rails, min assist to sit.  Pt balanced in sitting (with L lean /collapsed trunk on L) during dressing: doffing top, donning bra, tank top and dress. Total assist to doff non slip socks and don new ones.   Sit> stand from slightly raised bed, min assist, to RW.  Pt pulled dress down in standing with CGa and RW.  Stand step transfer , backing up while using  Rw to power wc, min assist.  L knee did not buckle.  Pt used power features of wc to move partially backwards into wc; pt able to wt shift to assist PT in scooting her fully back into wc, mod assist.  Pt reported that she does not usually use foot plates of wc, as she stays reclined with bil LEs elevated during the day.   PT educated pt on maintaining flexibility of bil ankles with support of footrests.  Pt verbalized understanding.  Pt sits with collapsed trunk L; this is a new wc, which has hip guides but no trunk guides.  She may benefit from a L trunk guide to prevent development of scoliosis; pt verbalized understanding.   Strengthening while avoiding muscle spasms R/LLE in reclined/tilted position in wc: R mass flexion> extension with 50% effort and paced breathing, x 5; active  assistive L mass flexion  > extension with paced breathing x 5.  At end of session, pt resting in wc with needs at hand.      Therapy Documentation Precautions:  Precautions Precautions: Fall Restrictions Weight Bearing Restrictions: No General: PT Amount of Missed Time (min): 15 Minutes PT Missed Treatment Reason: Other (Comment) (late breakfast tray; pt asked to eat before statrting PT)      Therapy/Group: Individual Therapy  Annita Ratliff 03/17/2022, 10:27 AM

## 2022-03-17 NOTE — Progress Notes (Signed)
Speech Language Pathology Daily Session Note  Patient Details  Name: Sharon Odom MRN: [id number] Date of Birth: [birth year]  Today's Date: 03/17/2022 SLP Individual Time: 1300-1400 SLP Individual Time Calculation (min): 60 min  Short Term Goals: Week 1: SLP Short Term Goal 1 (Week 1): STG=LTG due to short ELOS  Skilled Therapeutic Interventions: S: Pt seen this date for skilled ST intervention targeting swallowing goals outlined in care plan. Pt received awake/alert and OOB in power w/c.  Agreeable to ST intervention in hospital room.   O: SLP facilitated today's session by providing: - Re-education re: results of MBSS and recommendations to mitigate aspiration risk to include practicing good oral hygiene, use of small bites and sips, secondary swallow, effortful swallow, and ongoing management of dry mouth; pt implemented techniques/recommendations given Min A verbal cues. - Skilled observation of PO intake for recommended diet consistencies (regular textures and thin liquids) - pt exhibited only minimal s/sx concerning for airway compromise to include cough x 1 and subtle throat clear x 2 following >16 oz of thin liquid; vocal quality intermittently wet though did not appear directly r/t PO intake. Pt only accepted one bite of cottage cheese and peaches due to decreased appetite in the setting of constipation. Oral phase appears functional with minimal oral residuals on L that pt successfully cleared with lingual sweep and liquid rinse. Per chart review, vital signs and medical status stable.   A: Pt appears sitmulable for skilled ST intervention. Continue to recommend current diet consistencies. Will likely be able to d/c from [place] in upcoming sessions, if pt continues to demonstrate tolerance of current diet and recalls and implements aspiration precautions successfully with minimal intervention.  P: Pt left in room and OOB in power w/c with all safety measures activated. Call bell  reviewed and within reach and all immediate needs met. Notified NT of pt's request to reposition in chair. Continue per current ST POC next session.   Pain Reports >10 out of 10 pain in lower extremities; LPN notified  Therapy/Group: Individual Therapy  Suraya Vidrine A Zavia Pullen 03/17/2022, 3:10 PM

## 2022-03-18 DIAGNOSIS — G35 Multiple sclerosis: Secondary | ICD-10-CM | POA: Diagnosis not present

## 2022-03-18 MED ORDER — SENNOSIDES-DOCUSATE SODIUM 8.6-50 MG PO TABS
2.0000 | ORAL_TABLET | Freq: Two times a day (BID) | ORAL | Status: DC
  Administered 2022-03-18 – 2022-03-21 (×6): 2 via ORAL
  Filled 2022-03-18 (×6): qty 2

## 2022-03-18 MED ORDER — SORBITOL 70 % SOLN
45.0000 mL | Freq: Once | Status: AC
  Administered 2022-03-18: 45 mL via ORAL
  Filled 2022-03-18: qty 60

## 2022-03-18 NOTE — Progress Notes (Signed)
Pt transferred to [facility] to attempt to have BM. Pt requested that nurse perform dig stim. Pt also performed dig stim herself. Unable to have BM.

## 2022-03-18 NOTE — Progress Notes (Signed)
Occupational Therapy Session Note  Patient Details  Name: Sharon Odom MRN: [id number] Date of Birth: Dec 26, 1970  Today's Date: 03/18/2022 OT Individual Time: 4709-6283 OT Individual Time Calculation (min): 65 min    Short Term Goals: Week 1:  OT Short Term Goal 1 (Week 1): STG=LTG d/t ELOS  Skilled Therapeutic Interventions/Progress Updates:    Upon OT arrival, pt reports discomfort secondary to not having a BM in 5+ days. Pt reports she has been given all the medications to facilitate having a BM as well as experiencing weakness compared to yesterday. Pt agreeable to OT treatment session and requests a shower. Pt completes supine to sit transfer with use of leg lifter and SBA. Increased time required. Pt completes stand pivot transfer with RW to w/c and CGA. Pt was transported into bathroom and doffs clothes at the levels below. Pt completes stand pivot transfer to [facility] located in shower and performs bathing at the levels below. Pt noted to have multiple bouts of liquid stool and LPN and MD notified. Pt incontienent but was able to complete peri care while seated on BSC in shower. Pt completes UB dressing seated on BSC and was left in shower with LPN secondary to time constraints and incontinence.   Therapy Documentation Precautions:  Precautions Precautions: Fall Restrictions Weight Bearing Restrictions: No    ADL: ADL Grooming: Supervision/safety (to wash face only) Where Assessed-Grooming: Other (comment) (shower) Upper Body Bathing: Contact guard Where Assessed-Upper Body Bathing: Shower Lower Body Bathing: Minimal assistance (to wash lower legs) Where Assessed-Lower Body Bathing: Shower (seated on BSC) Upper Body Dressing: Moderate assistance Where Assessed-Upper Body Dressing: Other (Comment) (BSC in shower) Lower Body Dressing: Contact guard (to doff socks and brief) Where Assessed-Lower Body Dressing: Wheelchair Toileting: Minimal assistance Where Assessed-Toileting:  Other (Comment) (shower) Toilet Transfer: Minimal assistance Toilet Transfer Method: Engineer, water: Bedside commode, Other (comment) (PWC) Walk-In Shower Transfer: Environmental education officer Method: Radiographer, therapeutic: Other (comment), Grab bars (BSC)    Therapy/Group: Individual Therapy  Marvetta Gibbons 03/18/2022, 10:11 AM

## 2022-03-18 NOTE — Progress Notes (Signed)
PROGRESS NOTE   Subjective/Complaints:  Pt reports no change with methadone ended up starting yesterday 2.5 mg BID- explained it doesn't treat spasticity pain- and don't want to increase until she's cleaned out.  She reports 2 small-medium stools last night, but still has no appetite/cannot eat- and stomach feels distended/bloated.   Also thinks getting too much PO baclofen- explained has 20-40 mg for 2nd/3rd doses during day, so she can titrate, but has to ask nursing.  Also feels like getting stiffer, because "not having ROM"- even though at home, she only walked a few steps - mainly a long transfer at home- doesn't feel like doing as well/as much as she wants/needs.   Sharon Odom will ask PT if can get her more time for parallel bars, but will need to talk with them about his ability to walk-   ROS:  Pt denies SOB, abd pain, CP, N/V/(+) C/D, and vision changes  Except for HPI   Objective:   No results found. No results for input(s): "WBC", "HGB", "HCT", "PLT" in the last 72 hours.  No results for input(s): "NA", "K", "CL", "CO2", "GLUCOSE", "BUN", "CREATININE", "CALCIUM" in the last 72 hours.   Intake/Output Summary (Last 24 hours) at 03/18/2022 1231 Last data filed at 03/18/2022 0845 Gross per 24 hour  Intake 520 ml  Output 2850 ml  Net -2330 ml        Physical Exam: Vital Signs Blood pressure 111/66, pulse 73, temperature (!) 97.5 F (36.4 C), temperature source Oral, resp. rate 15, height 5\' 9"  (1.753 m), weight 57.6 kg, SpO2 99 %.       General: awake, alert, appropriate, sitting up in bed; NAD HENT: conjugate gaze; oropharynx dry- dysphonia- using amplifier by SLP CV: regular rate; no JVD Pulmonary: CTA B/L; no W/R/R- good air movement GI: more firm- not soft; distended; (+) ITB pump in place- NT- hypoactive BS Psychiatric: appropriate Neurological: Ox3- slightly delayed responses- stable - has 5 beats  clonus LLE  GU: leg bag from foley almost full Extremities: 2+ RLE ankle/lower calf/foot swelling- pitting MS: RUE 5-/5 except FA 4+/5 LUE 4-/5 throughout RLE- 4-/5;  LLE- HF 1/5; KE 2-/5; otherwise 1/5 Neuro: Low tone LUE RUE- normal tone RLE MAS of 2-3 with significant extensor tone with simple ROM LLE MAS 1+ No hoffman's B/L Few beats clonus on LLE but 5-7 beats RLE Psych: interactive; slightly flat affect; slightly depressed      Assessment/Plan: 1. Functional deficits which require 3+ hours per day of interdisciplinary therapy in a comprehensive inpatient rehab setting. Physiatrist is providing close team supervision and 24 hour management of active medical problems listed below. Physiatrist and rehab team continue to assess barriers to discharge/monitor patient progress toward functional and medical goals  Care Tool:  Bathing  Bathing activity did not occur: Refused Body parts bathed by patient: Right arm, Left arm, Chest, Abdomen, Front perineal area, Buttocks, Right upper leg, Left upper leg, Face   Body parts bathed by helper: Right lower leg, Left lower leg     Bathing assist Assist Level: Moderate Assistance - Patient 50 - 74%     Upper Body Dressing/Undressing Upper body dressing   What  is the patient wearing?: Pull over shirt, Bra    Upper body assist Assist Level: Minimal Assistance - Patient > 75%    Lower Body Dressing/Undressing Lower body dressing      What is the patient wearing?: Pants, Underwear/pull up     Lower body assist Assist for lower body dressing: Maximal Assistance - Patient 25 - 49%     Toileting Toileting    Toileting assist Assist for toileting: Moderate Assistance - Patient 50 - 74%     Transfers Chair/bed transfer  Transfers assist     Chair/bed transfer assist level: Minimal Assistance - Patient > 75%     Locomotion Ambulation   Ambulation assist   Ambulation activity did not occur: Safety/medical concerns  (unable to perform due to tone, strength, activity tolerance, and coordination deficits)          Walk 10 feet activity   Assist  Walk 10 feet activity did not occur: Safety/medical concerns (unable to perform due to tone, strength, activity tolerance, and coordination deficits)        Walk 50 feet activity   Assist Walk 50 feet with 2 turns activity did not occur: Safety/medical concerns (unable to perform due to tone, strength, activity tolerance, and coordination deficits)         Walk 150 feet activity   Assist Walk 150 feet activity did not occur: Safety/medical concerns (unable to perform due to tone, strength, activity tolerance, and coordination deficits)         Walk 10 feet on uneven surface  activity   Assist Walk 10 feet on uneven surfaces activity did not occur: Safety/medical concerns (unable to perform due to tone, strength, activity tolerance, and coordination deficits)         Wheelchair     Assist Is the patient using a wheelchair?: Yes Type of Wheelchair: Power    Wheelchair assist level: Supervision/Verbal cueing Max wheelchair distance: 10 ft    Wheelchair 50 feet with 2 turns activity    Assist    Wheelchair 50 feet with 2 turns activity did not occur: Safety/medical concerns (unable to perform due to tone, strength, activity tolerance, and coordination deficits)       Wheelchair 150 feet activity     Assist  Wheelchair 150 feet activity did not occur: Safety/medical concerns (unable to perform due to tone, strength, activity tolerance, and coordination deficits)       Blood pressure 111/66, pulse 73, temperature (!) 97.5 F (36.4 C), temperature source Oral, resp. rate 15, height 5\' 9"  (1.753 m), weight 57.6 kg, SpO2 99 %.  Medical Problem List and Plan: 1. Functional deficits secondary to secondary progressive multiple sclerosis with spasticity and uncontrolled pain- and need to titration up of ITB pump - pt's  level of function has been declining for last few months- and now is max-total A             -patient may  shower             -ELOS/Goals: min Assist 14-18 days  Con't CIR_ PT, OT and SLP- doesn't have much support at home.  2.  Antithrombotics: -DVT/anticoagulation:  Pharmaceutical: Lovenox             -antiplatelet therapy: none 3. Pain/spasticity management:  -baclofen IT pump- will titrate while here             -baclofen 40 mg-  -oxycodone 15 mg - will increase to q4 hours prn for therapy.  -  dantrolene 50 mg -tizanidine 4 mg 10/18- will add Baclofen 20 mg daily prn for during day when spasticity worse; also d/c ibuprofen and add Aleve 500 mg BID- at 6am/6pm 10/22- added methadone 2.5 mg BID yesterday- pt doesn't feel a difference- don't want ot increase until cleaned out, stool wise- can increase Monday?Tuesday to 5 mg BID if cleaned out.  4. Mood/Behavior/Sleep: LCSW to evaluate and provide emotional support             -continue Wellbutrin XL 300 mg daily             -continue Lamictal 25 mg 2 tabs BID             -antipsychotic agents: n/a 5. Neuropsych/cognition: This patient is capable of making decisions on her own behalf. 6. Skin/Wound Care: Routine skin care checks 7. Fluids/Electrolytes/Nutrition: Routine Is and Os and follow-up chemistries             -continue vitamin supplements (C, D3, B12) 8: GERD: continue Zantac 9: Neurogenic bladder: continue Foley catheter- last changed 03/01/22- will need to be changed prior to d/c.  10: Bowel program: continue psyllium, Miralax, Senekot  101/9- increased senna to 3 tabs daily and added miralax daily.  10/21- no BM since admission- will give Sorbitol and SSE if needed after therapy today  10/22- at least 2 mushy stools last night-  11: Edema: continue Lasix 40 mg daily- also OT explained that she was having a lot of stool without control in shower today- hopefully with that and possible another dose of Sorbitol after therapy today,  she can get cleaned out.  12: Season allergies: continue Afrin PRN, Singulair daily 13: Post-menopausal: continue Premarin vaginal cream  14. Dysphagia/resp issues- will get CXR and Slp eval for swallowing issues- had been on Nectar thick liquids prior.   10/20- MBS today  10/21- can stay on thin liquids with effortful swallows and secondary swallow-   15. Tooth abscess- con't Flagyl- finish Friday 250 mg TID.   16. Hypokalemia  10/18- K+ 3.3- will replete 40 mEq x1 and then put on 10 mEq daily since on Lasix.  17. ITB pump/Spasticity  10/19- will increase ITB pump today- increased from 386 mcg/day to 425 mcg/day- depending on her results, will see when to titrate her up next?   10/20- since still in bed; hasn't felt if better yet- will recheck in AM  10/21- doesn't feel looser so far- will try to increase tomorrow  10/22- Feels that PO baclofen is making her more constipated, but, because not stretching as much "as she usually does"- feels tighter than normal -also wants to take less oral- but explained is written for 2nd/3rd doses of baclofen to be 20-40 mg- she can titrate as she wants.    I spent a total of 41   minutes on total care today- >50% coordination of care- due to prolonged d/w pt about spasticity and pain; also with PT about seeing if therapy can increase her time in parallel bars, etc; and nursing about care issues.   LOS: 5 days A FACE TO FACE EVALUATION WAS PERFORMED  Sharon Odom 03/18/2022, 12:31 PM

## 2022-03-18 NOTE — Consult Note (Signed)
Neuropsychological Consultation   Patient:   Sharon Odom   DOB:   01/26/1971  MR Number:  [id number]  Location:  [facility] [address] CENTER [address] [id number] [place] [address] Dept: [phone] Loc: [phone]           Date of Service:   03/16/2022  Start Time:   10 AM End Time:   11:15 AM  Provider/Observer:  Arley Phenix, Psy.D.       Clinical Neuropsychologist       Billing Code/Service: [phone]  Chief Complaint:    Sharon Odom is a 51 year old female referred for neuropsychological consultation due to coping and adjustment issues with history of progressive MS.  Anxiety and depressive symptoms have been present with significant functional decline over several years.  Patient has had significant left greater than right spasticity with subsequent implantation of baclofen pump.  Patient underwent revision of pump on 01/11/2022.  Patient reported on 03/02/2022 worsening pain despite increase of 5% and baclofen pump.  Patient is also taking oral baclofen, oxycodone.  Patient has significant hypophonia, neurogenic bladder.  Patient has limited mobility and uses a power wheelchair.  Patient continues to take Wellbutrin for depressive symptomatology and has been followed by Dr. Berline Chough in the outpatient clinic as well.  Reason for Service:  Patient referred for neuropsychological consultation due to coping and adjustment issues with significant anxiety and depressive symptoms.  Below is the HPI for the current admission.  HPI: Sharon Odom is a 51 year old female with a history of secondary progressive multiple sclerosis. She has had significant left greater than right spasticity with subsequent implantation of intrathecal pump for baclofen administration. She underwent revision of the abdominal portion of the pump by Dr. Jake Samples on 01/11/2022. At the time of follow up on 10/6, she reported worsening pain especially when  sitting despite increase in 5% increase in baclofen pump. She has been taking oral baclofen 20 mg six/day, oxycodone 10 mg six/day and on Dantrolene 100 mg BID. Continues on bowel regimen of Miralax, Metamucil and Senekot.  She has significant hypophonia, neurogenic bladder with indwelling Foley catheter. She is tolerating regular diet with thin liquids and taking medications crushed in applesauce.  She had limited mobility and uses a power wheelchair. Sleeps in chair or on couch. She can drive WC into handicap shower and transfers to shower bench. She uses a portable BSC transfers for bowel program. She has a vertical power lift in her garage to from from power WC to car. She uses a manual WC in the community for appointments. He husband lifts her onto couch, into car, etc as she has not stood for many months. The patient requires inpatient physical medicine and rehabilitation evaluations and treatment secondary to dysfunction due to secondary progressive multiple sclerosis.   She follows with Dr. Despina Arias, [facility]. Last [place] in 2021.   Pt reports spasticity has been getting worse- 6 months ago, her strength was much better- was able to transfer in/out of a shower; walk ~ 20-30 ft with RW, and transfer without help- not needs almost max-total A to transfer; hasn't been able to get in shower, needs foley for toileting.  Had been put on nectar thick liquids when in hospital last- stopped, because swallowing "felt better" and hated them.   Current Status:  Patient was awake and alert looking forward and anticipating her visit.  Patient used a microphone with amplifier to help her be  more audible and understandable when she talks.  Patient was able to review her history and significant loss she has had over many years with the progression of her MS.  Patient acknowledges depression/anxiety and coping issues with severe and profound progression of her symptoms.  Patient describes  some of it issues that have impacted her relatively recently was moving from [place] to [place] and not having friends or any family here and with her severe deficits not having much of a social network outside of her husband and family.  Behavioral Observation: Sharon Odom  presents as a 51 y.o.-year-old Right handed Caucasian Female who appeared her stated age. her dress was Appropriate and she was Well Groomed and her manners were Appropriate to the situation.  her participation was indicative of Appropriate and Redirectable behaviors.  There were physical disabilities noted.  she displayed an appropriate level of cooperation and motivation.     Interactions:    Active Appropriate  Attention:   within normal limits and attention span and concentration were age appropriate  Memory:   within normal limits; recent and remote memory intact  Visuo-spatial:  not examined  Speech (Volume):  low  Speech:   normal; slurred  Thought Process:  Coherent and Relevant  Though Content:  WNL; not suicidal and not homicidal  Orientation:   person, place, time/date, and situation  Judgment:   Good  Planning:   Fair  Affect:    Anxious, Depressed, and Tearful  Mood:    Dysphoric  Insight:   Good  Intelligence:   high   Medical History:   Past Medical History:  Diagnosis Date   Allergies    Anxiety    Arthritis    Asthma    seasonal - pollen   Constipation    Depression    Dyspnea    with exertion   GERD (gastroesophageal reflux disease)    Headache    History of hiatal hernia    Left radial nerve palsy 03/15/2020   resolved - Left arm/hand weak   Multiple sclerosis (HCC)    Pneumonia 2007   x 1   Vision abnormalities    Wears glasses          Patient Active Problem List   Diagnosis Date Noted   S/P insertion of intrathecal pump 03/13/2022   Neurogenic bladder 12/29/2021   Urinary retention 12/29/2021   Thrombocytopenia (HCC) 11/08/2021   Spasticity 09/28/2021    Spastic hemiparesis of left nondominant side due to non-cerebrovascular etiology (HCC) 07/17/2021   Relapsing remitting multiple sclerosis (HCC) 07/17/2021   Transient hypotension 06/18/2021   Mild intermittent asthma 06/08/2021   Urinary incontinence 06/08/2021   Wound infection after surgery 04/06/2021   Mechanical complication of device 12/21/2020   Wheelchair dependence 12/16/2020   Left arm weakness 07/15/2020   Left radial nerve palsy 03/15/2020   Hoarseness 12/14/2019   Dysarthria 12/14/2019   Dysesthesia 09/03/2019   Low serum vitamin B12 06/08/2019   Low serum vitamin D 06/08/2019   Vitamin deficiency related neuropathy (HCC) 06/08/2019   Multiple sclerosis (HCC) 05/13/2019   Steroid-induced hyperglycemia    Hypoalbuminemia due to protein-calorie malnutrition (HCC)    Neurogenic bowel    Asthma 04/02/2019   Constipated 04/02/2019   Spell of generalized weakness 03/31/2019   Presence of intrathecal baclofen pump 03/19/2019   Dysphagia 04/14/2018   Spastic diplegia, acquired, lower extremity (HCC) 10/16/2017   Muscle spasticity 08/15/2017   Gait disturbance 08/15/2017   Depression with anxiety 08/15/2017  Diplopia 08/15/2017   Multiple sclerosis exacerbation ([place]) 03/04/2017   High risk medication use 02/06/2017    Psychiatric History:  Patient with depressive and anxiety type symptoms going back several years but none prior to the development of significant impact of her multiple sclerosis.  Family Med/Psych History:  Family History  Problem Relation Age of Onset   Diabetes Mother    Healthy Brother    Impression/DX:  Sharon Odom is a 51 year old female referred for neuropsychological consultation due to coping and adjustment issues with history of progressive MS.  Anxiety and depressive symptoms have been present with significant functional decline over several years.  Patient has had significant left greater than right spasticity with subsequent implantation  of baclofen pump.  Patient underwent revision of pump on 01/11/2022.  Patient reported on 03/02/2022 worsening pain despite increase of 5% and baclofen pump.  Patient is also taking oral baclofen, oxycodone.  Patient has significant hypophonia, neurogenic bladder.  Patient has limited mobility and uses a power wheelchair.  Patient continues to take Wellbutrin for depressive symptomatology and has been followed by Dr. Dagoberto Ligas in the outpatient clinic as well.  Patient was awake and alert looking forward and anticipating her visit.  Patient used a microphone with amplifier to help her be more audible and understandable when she talks.  Patient was able to review her history and significant loss she has had over many years with the progression of her MS.  Patient acknowledges depression/anxiety and coping issues with severe and profound progression of her symptoms.  Patient describes some of it issues that have impacted her relatively recently was moving from [place] to [place] and not having friends or any family here and with her severe deficits not having much of a social network outside of her husband and family.  Disposition/Plan:  Today we had a very productive meeting going over current symptoms and status and working on some of the primary drivers of her worries and depressive symptomatology.  Patient with appropriate emotional response to a very challenging difficult situation with progressive MS.  Diagnosis:    Multiple sclerosis ([place]) - Plan: Comprehensive metabolic panel, Basic metabolic panel, CBC with Differential/Platelet, Comprehensive metabolic panel, CBC with Differential/Platelet         Electronically Signed   _______________________ Ilean Skill, Psy.D. Clinical Neuropsychologist

## 2022-03-19 DIAGNOSIS — G35 Multiple sclerosis: Secondary | ICD-10-CM | POA: Diagnosis not present

## 2022-03-19 LAB — BASIC METABOLIC PANEL
Anion gap: 8 (ref 5–15)
BUN: 14 mg/dL (ref 6–20)
CO2: 30 mmol/L (ref 22–32)
Calcium: 9.2 mg/dL (ref 8.9–10.3)
Chloride: 100 mmol/L (ref 98–111)
Creatinine, Ser: 0.87 mg/dL (ref 0.44–1.00)
GFR, Estimated: >60 mL/min (ref >60)
Glucose, Bld: 96 mg/dL (ref 70–99)
Potassium: 3.4 mmol/L — ABNORMAL LOW (ref 3.5–5.1)
Sodium: 138 mmol/L (ref 135–145)

## 2022-03-19 MED ORDER — OXYCODONE HCL 5 MG PO TABS
10.0000 mg | ORAL_TABLET | ORAL | Status: DC | PRN
  Administered 2022-03-19 – 2022-03-24 (×19): 10 mg via ORAL
  Filled 2022-03-19 (×19): qty 2

## 2022-03-19 MED ORDER — METHADONE HCL 5 MG PO TABS
5.0000 mg | ORAL_TABLET | Freq: Two times a day (BID) | ORAL | Status: DC
  Administered 2022-03-19 – 2022-03-27 (×17): 5 mg via ORAL
  Filled 2022-03-19 (×16): qty 1

## 2022-03-19 NOTE — Progress Notes (Signed)
Speech Language Pathology Discharge Summary  Patient Details  Name: Sharon Odom MRN: [id number] Date of Birth: 1970-11-22  Date of Discharge from [place] 23, 2023  Today's Date: 03/19/2022 SLP Individual Time:  819-904  45 min      Skilled Therapeutic Interventions:  Skilled ST services focused on swallow skills and education.Pt expressed the desire to participate in only PT/OT services during remainder of CIR stay. Pt demonstrated recall of swallow strategies and SLP provided education for need for oral care before and after PO intake due to xerostomia and BOT residue. Pt supports difficulty initiating secondary swallow to reduce residue. SLP reviewed MBS slides with pt and educated pt on taking a 1/4 tsp sip of liquid to initiate the secondary swallow. Pt demonstrated ability to utilize swallow strategies mod I with thin liquids and demonstrating cough with and without PO intake likely due to pharyngeal residue. SLP set up oral care. All question answered to satisfaction. Pt was left in room with call bell within reach and bed alarm set. SLP recommends to continue skilled services.    Patient has met 3 of 3 long term goals.  Patient to discharge at overall Supervision level.  Reasons goals not met:     Clinical Impression/Discharge Summary:   Pt made good progress meeting 3 out 3 goals, discharging at supervision A for swallow function of regular textures and thin liquids. Pt demonstrates xerostomia which increase oral/pharyngeal residue and premature spillage. MBS on 10/18 indicating sensed aspiration on large sips of thin liquids and mild-moderate BOT and valleculae residue. Pt benefits from good oral care and secondary swallows to reduce residue, as well as small sips and bites. Pt's vital signs are WFL, but does have a h/o of PNA. Pt benefited from skilled ST services in order to maximize functional independence and no further St services required.   Care Partner:  Caregiver  Able to Provide Assistance: Yes  Type of Caregiver Assistance: Cognitive;Physical  Recommendation:  24 hour supervision/assistance;None      Equipment: N/A   Reasons for discharge: Treatment goals met   Patient/Family Agrees with Progress Made and Goals Achieved: Yes    Aldric Wenzler 03/19/2022, 10:02 AM

## 2022-03-19 NOTE — Progress Notes (Signed)
Physical Therapy Session Note  Patient Details  Name: Sharon Odom MRN: [id number] Date of Birth: 1971-01-31  Today's Date: 03/19/2022 PT Individual Time: 8563-1497 PT Individual Time Calculation (min): 71 min   Short Term Goals: Week 1:  PT Short Term Goal 1 (Week 1): STG=LTG due ELOS  Skilled Therapeutic Interventions/Progress Updates:      Therapy Documentation Precautions:  Precautions Precautions: Fall Restrictions Weight Bearing Restrictions: No    03/19/22 1556  RLE Tone  Body Part - Modified Ashworth Scale Hamstrings;Gastrocnemius  Hamstrings - Modified Ashworth Scale for Grading Hypertonia RLE 1+  GASTROCNEMIUS - Modified Ashworth Scale for Grading Hypertonia RLE 3     03/19/22 1557  LLE Tone  Hamstrings - Modified Ashworth Scale for Grading Hypertonia LLE 1  Gastrocnemius - Modified Ashworth Scale for Grading Hypertonia LLE 1   Pt received seated in PWC at sink and agreeable to PT session with emphasis on L LE strengthening and functional transfers. Pt with unrated pain BLE due to increased spasticity and provided with rest breaks and repositioning for relief. Pt navigated PWC to main gym and participated in blocked practice of sit to stand transfers in parallel bars with (S) for safety. Pt presents with left knee buckling and instability and would benefit from brace consult to increase independence with transfers. Orthotist will evaluate pt tomorrow at 1 PM during PT for brace. In standing pt performed terminal knee extension 3 x 5 and requires tactile and verbal cueing for posture. Pt required seated rest breaks in between sets. Pt transitioned to SAQ 1 x 10 seated in PWC and performed quad sets 1 x 5 with towel under knee for tactile cueing. Pt navigated w/c to room and left with all needs in reach.   Therapy/Group: Individual Therapy  Verl Dicker Verl Dicker PT, DPT  03/19/2022, 4:01 PM

## 2022-03-19 NOTE — Progress Notes (Signed)
Occupational Therapy Session Note  Patient Details  Name: Sharon Odom MRN: [id number] Date of Birth: 1970/12/14  Today's Date: 03/19/2022 OT Individual Time: 0945-1100 OT Individual Time Calculation (min): 75 min    Short Term Goals: Week 1:  OT Short Term Goal 1 (Week 1): STG=LTG d/t ELOS  Skilled Therapeutic Interventions/Progress Updates:  Pt seen for comprehensive skilled OT session this am. Pt requesting shower re-training as pt reports she has had some residual BM incontinence since BM in shower yesterday. Pain reported as 9/10 B LE's as Md is currently adjusting meds and baclofen titration. MAS testing completed bed level in supine with again no changes since IE in B UE spacticity to note. See status as gathered for ID Team Conference tomorrow as this primary clinician will be off and covering OT will report as follows:   OT providing falls prevention, joint protection and energy conservation cues throughout session with pt open to all training. Self care and transfer status: occasional min A for UB dressing due to fasteners, set up grooming, CGA UB bathing in shower, mod A LB bathing, mod A fading to min A for toileting post BM- has Foley for urine mngt, Min A toilet and shower transfers to [facility], UB tone has remained stable  Pt left at end of session sink side to complete hair care in power w/c. Nurse call button and needs left on tray table in reach and chair alarm discontinued by MD.     MAS    03/19/22  LUE Tone  LUE Tone Modified Ashworth  Body Part - Modified Ashworth Scale Elbow;Wrist;Fingers;Thumb  Modified Ashworth Scale for Grading Hypertonia LUE 4  Elbow - Modified Ashworth Scale for Grading Hypertonia LUE 4  Wrist - Modified Ashworth Scale for Grading Hypertonia LUE 0  Thumb - Modified Ashworth Scale for Grading Hypertonia LUE 0      03/19/22   RUE Tone  RUE Tone Modified Ashworth  Body Part - Modified Ashworth Scale Elbow;Wrist;Fingers;Thumb  Modified Ashworth  Scale for Grading Hypertonia RUE 1  Elbow - Modified Ashworth Scale for Grading Hypertonia RUE 1  Wrist - Modified Ashworth Scale for Grading Hypertonia RUE 0  Fingers - Modified Ashworth Scale for Grading Hypertonia RUE 0  Thumb - Modified Ashworth Scale for Grading Hypertonia RUE        Therapy Documentation Precautions:  Precautions Precautions: Fall Restrictions Weight Bearing Restrictions: No    Therapy/Group: Individual Therapy  Barnabas Lister 03/19/2022, 7:43 AM

## 2022-03-19 NOTE — Progress Notes (Signed)
PROGRESS NOTE   Subjective/Complaints:  Pain in both LEs and LUE not controlled on Methadone, did not take any oxy yesterday   Except for HPI   Objective:   No results found. No results for input(s): "WBC", "HGB", "HCT", "PLT" in the last 72 hours.  Recent Labs    03/19/22 0602  NA 138  K 3.4*  CL 100  CO2 30  GLUCOSE 96  BUN 14  CREATININE 0.87  CALCIUM 9.2     Intake/Output Summary (Last 24 hours) at 03/19/2022 0816 Last data filed at 03/19/2022 0509 Gross per 24 hour  Intake 620 ml  Output 1000 ml  Net -380 ml         Physical Exam: Vital Signs Blood pressure (!) 94/57, pulse 76, temperature 98.3 F (36.8 C), resp. rate 15, height 5\' 9"  (1.753 m), weight 57.6 kg, SpO2 98 %.   General: No acute distress Mood and affect are appropriate Heart: Regular rate and rhythm no rubs murmurs or extra sounds Lungs: Clear to auscultation, breathing unlabored, no rales or wheezes Abdomen: Positive bowel sounds, soft nontender to palpation, nondistended Extremities: No clubbing, cyanosis, or edema Skin: No evidence of breakdown, no evidence of rash Neurologic: Cranial nerves II through XII intact, motor strength is 5/5 in R deltoid, bicep, tricep, grip,RLE 3- hip flexor,2- knee extensors, 0 ankle dorsiflexor and plantar flexor Trace Left HF, KE, 0/5 ankle DF  Sensory exam normal sensation to light touch and proprioception in bilateral upper and lower extremities Cerebellar exam normal finger to nose to finger as well as heel to shin in bilateral upper and lower extremities Musculoskeletal: Full range of motion in all 4 extremities. No joint swelling   Few beats clonus on LLE but 5-7 beats RLE Psych: interactive; slightly flat affect; slightly depressed      Assessment/Plan: 1. Functional deficits which require 3+ hours per day of interdisciplinary therapy in a comprehensive inpatient rehab  setting. Physiatrist is providing close team supervision and 24 hour management of active medical problems listed below. Physiatrist and rehab team continue to assess barriers to discharge/monitor patient progress toward functional and medical goals  Care Tool:  Bathing  Bathing activity did not occur: Refused Body parts bathed by patient: Right arm, Left arm, Chest, Abdomen, Front perineal area, Buttocks, Right upper leg, Left upper leg, Face   Body parts bathed by helper: Right lower leg, Left lower leg     Bathing assist Assist Level: Moderate Assistance - Patient 50 - 74%     Upper Body Dressing/Undressing Upper body dressing   What is the patient wearing?: Pull over shirt, Bra    Upper body assist Assist Level: Minimal Assistance - Patient > 75%    Lower Body Dressing/Undressing Lower body dressing      What is the patient wearing?: Pants, Underwear/pull up     Lower body assist Assist for lower body dressing: Maximal Assistance - Patient 25 - 49%     Toileting Toileting    Toileting assist Assist for toileting: Moderate Assistance - Patient 50 - 74%     Transfers Chair/bed transfer  Transfers assist  Chair/bed transfer activity did not occur: Safety/medical concerns  Chair/bed transfer assist level: Minimal Assistance - Patient > 75%     Locomotion Ambulation   Ambulation assist   Ambulation activity did not occur: Safety/medical concerns (unable to perform due to tone, strength, activity tolerance, and coordination deficits)          Walk 10 feet activity   Assist  Walk 10 feet activity did not occur: Safety/medical concerns (unable to perform due to tone, strength, activity tolerance, and coordination deficits)        Walk 50 feet activity   Assist Walk 50 feet with 2 turns activity did not occur: Safety/medical concerns (unable to perform due to tone, strength, activity tolerance, and coordination deficits)         Walk 150 feet  activity   Assist Walk 150 feet activity did not occur: Safety/medical concerns (unable to perform due to tone, strength, activity tolerance, and coordination deficits)         Walk 10 feet on uneven surface  activity   Assist Walk 10 feet on uneven surfaces activity did not occur: Safety/medical concerns (unable to perform due to tone, strength, activity tolerance, and coordination deficits)         Wheelchair     Assist Is the patient using a wheelchair?: Yes Type of Wheelchair: Power    Wheelchair assist level: Supervision/Verbal cueing Max wheelchair distance: 10 ft    Wheelchair 50 feet with 2 turns activity    Assist    Wheelchair 50 feet with 2 turns activity did not occur: Safety/medical concerns (unable to perform due to tone, strength, activity tolerance, and coordination deficits)       Wheelchair 150 feet activity     Assist  Wheelchair 150 feet activity did not occur: Safety/medical concerns (unable to perform due to tone, strength, activity tolerance, and coordination deficits)       Blood pressure (!) 94/57, pulse 76, temperature 98.3 F (36.8 C), resp. rate 15, height 5\' 9"  (1.753 m), weight 57.6 kg, SpO2 98 %.  Medical Problem List and Plan: 1. Functional deficits secondary to secondary progressive multiple sclerosis with spasticity and uncontrolled pain- and need to titration up of ITB pump - pt's level of function has been declining for last few months- and now is max-total A             -patient may  shower             -ELOS/Goals: min Assist 14-18 days  Con't CIR_ PT, OT and SLP- doesn't have much support at home.  2.  Antithrombotics: -DVT/anticoagulation:  Pharmaceutical: Lovenox             -antiplatelet therapy: none 3. Pain/spasticity management:  -baclofen IT pump- will titrate while here             -baclofen 40 mg-  -oxycodone 15 mg - will increase to q4 hours prn for therapy.  -dantrolene 50 mg -tizanidine 4  mg 10/18- will add Baclofen 20 mg daily prn for during day when spasticity worse; also d/c ibuprofen and add Aleve 500 mg BID- at 6am/6pm 10/23- added methadone 2.5 mg BID on 10/21, increase to 5mg  today , pt has not taken prn oxy IR 15mg  will reduce to 10mg  given higher dose of methadone 4. Mood/Behavior/Sleep: LCSW to evaluate and provide emotional support             -continue Wellbutrin XL 300 mg daily             -continue  Lamictal 25 mg 2 tabs BID             -antipsychotic agents: n/a 5. Neuropsych/cognition: This patient is capable of making decisions on her own behalf. 6. Skin/Wound Care: Routine skin care checks 7. Fluids/Electrolytes/Nutrition: Routine Is and Os and follow-up chemistries             -continue vitamin supplements (C, D3, B12) 8: GERD: continue Zantac 9: Neurogenic bladder: continue Foley catheter- last changed 03/01/22- will need to be changed prior to d/c.  10: Bowel program: continue psyllium, Miralax, Senekot  101/9- increased senna to 3 tabs daily and added miralax daily.  10/21- no BM since admission- will give Sorbitol and SSE if needed after therapy today  10/22- at least 2 mushy stools last night-  11: Edema: continue Lasix 40 mg daily- also OT explained that she was having a lot of stool without control in shower today- hopefully with that and possible another dose of Sorbitol after therapy today, she can get cleaned out.  12: Season allergies: continue Afrin PRN, Singulair daily 13: Post-menopausal: continue Premarin vaginal cream  14. Dysphagia/resp issues- will get CXR and Slp eval for swallowing issues- had been on Nectar thick liquids prior.   10/20- MBS today  10/21- can stay on thin liquids with effortful swallows and secondary swallow-   15. Tooth abscess- con't Flagyl- finish Friday 250 mg TID.   16. Hypokalemia  10/18- K+ 3.3- will replete 40 mEq x1 and then put on 10 mEq daily since on Lasix. Still low at 3.4 will increase KCL to 41meq 17. ITB  pump/Spasticity  10/19- will increase ITB pump today- increased from 386 mcg/day to 425 mcg/day- depending on her results, will see when to titrate her up next?      LOS: 6 days A FACE TO FACE EVALUATION WAS PERFORMED  Charlett Blake 03/19/2022, 8:16 AM

## 2022-03-20 DIAGNOSIS — G35 Multiple sclerosis: Secondary | ICD-10-CM | POA: Diagnosis not present

## 2022-03-20 MED ORDER — POTASSIUM CHLORIDE CRYS ER 20 MEQ PO TBCR
20.0000 meq | EXTENDED_RELEASE_TABLET | Freq: Every day | ORAL | Status: DC
  Administered 2022-03-21 – 2022-03-27 (×7): 20 meq via ORAL
  Filled 2022-03-20 (×7): qty 1

## 2022-03-20 MED ORDER — FAMOTIDINE 20 MG PO TABS
40.0000 mg | ORAL_TABLET | Freq: Every day | ORAL | Status: DC
  Administered 2022-03-20 – 2022-03-27 (×8): 40 mg via ORAL
  Filled 2022-03-20 (×8): qty 2

## 2022-03-20 NOTE — Progress Notes (Addendum)
PROGRESS NOTE   Subjective/Complaints:  Pt reports that is stiff, but pain much better.  Still regurgitating "Acid".   Oozing poop- ever since got cleaned out Also burning on back of thigh, where had ulcer.    ROS:  Pt denies SOB, abd pain, CP, N/V/C/D, and vision changes Except for HPI  Objective:   No results found. No results for input(s): "WBC", "HGB", "HCT", "PLT" in the last 72 hours.  Recent Labs    03/19/22 0602  NA 138  K 3.4*  CL 100  CO2 30  GLUCOSE 96  BUN 14  CREATININE 0.87  CALCIUM 9.2     Intake/Output Summary (Last 24 hours) at 03/20/2022 0849 Last data filed at 03/20/2022 0500 Gross per 24 hour  Intake 420 ml  Output 2900 ml  Net -2480 ml        Physical Exam: Vital Signs Blood pressure 121/79, pulse 87, temperature (!) 97.5 F (36.4 C), temperature source Oral, resp. rate 18, height 5\' 9"  (1.753 m), weight 57.6 kg, SpO2 95 %.    General: awake, alert, appropriate, sitting up in bed; hasn't eaten much breakfast; nurse in room; NAD HENT: conjugate gaze; oropharynx moist CV: regular rate; no JVD Pulmonary: CTA B/L; no W/R/R- good air movement GI: soft, NT, less distended; (+) BS; ITB pump in place Psychiatric: appropriate Neurological: Ox3  Skin: L posterior thigh more irritated- is blanchable Neurologic: Cranial nerves II through XII intact, motor strength is 5/5 in R deltoid, bicep, tricep, grip,RLE 3- hip flexor,2- knee extensors, 0 ankle dorsiflexor and plantar flexor Trace Left HF, KE, 0/5 ankle DF  Sensory exam normal sensation to light touch and proprioception in bilateral upper and lower extremities Cerebellar exam normal finger to nose to finger as well as heel to shin in bilateral upper and lower extremities Musculoskeletal: Full range of motion in all 4 extremities. No joint swelling   Few beats clonus on LLE but 5-7 beats RLE Psych: interactive; slightly flat  affect; slightly depressed       Assessment/Plan: 1. Functional deficits which require 3+ hours per day of interdisciplinary therapy in a comprehensive inpatient rehab setting. Physiatrist is providing close team supervision and 24 hour management of active medical problems listed below. Physiatrist and rehab team continue to assess barriers to discharge/monitor patient progress toward functional and medical goals  Care Tool:  Bathing  Bathing activity did not occur: Refused Body parts bathed by patient: Right arm, Left arm, Chest, Abdomen, Front perineal area, Buttocks, Right upper leg, Left upper leg, Face   Body parts bathed by helper: Right lower leg, Left lower leg     Bathing assist Assist Level: Moderate Assistance - Patient 50 - 74%     Upper Body Dressing/Undressing Upper body dressing   What is the patient wearing?: Pull over shirt, Bra    Upper body assist Assist Level: Minimal Assistance - Patient > 75%    Lower Body Dressing/Undressing Lower body dressing      What is the patient wearing?: Pants, Underwear/pull up     Lower body assist Assist for lower body dressing: Maximal Assistance - Patient 25 - 49%     Toileting Toileting  Toileting assist Assist for toileting: Moderate Assistance - Patient 50 - 74%     Transfers Chair/bed transfer  Transfers assist  Chair/bed transfer activity did not occur: Safety/medical concerns  Chair/bed transfer assist level: Minimal Assistance - Patient > 75%     Locomotion Ambulation   Ambulation assist   Ambulation activity did not occur: Safety/medical concerns (unable to perform due to tone, strength, activity tolerance, and coordination deficits)          Walk 10 feet activity   Assist  Walk 10 feet activity did not occur: Safety/medical concerns (unable to perform due to tone, strength, activity tolerance, and coordination deficits)        Walk 50 feet activity   Assist Walk 50 feet with 2  turns activity did not occur: Safety/medical concerns (unable to perform due to tone, strength, activity tolerance, and coordination deficits)         Walk 150 feet activity   Assist Walk 150 feet activity did not occur: Safety/medical concerns (unable to perform due to tone, strength, activity tolerance, and coordination deficits)         Walk 10 feet on uneven surface  activity   Assist Walk 10 feet on uneven surfaces activity did not occur: Safety/medical concerns (unable to perform due to tone, strength, activity tolerance, and coordination deficits)         Wheelchair     Assist Is the patient using a wheelchair?: Yes Type of Wheelchair: Power    Wheelchair assist level: Supervision/Verbal cueing Max wheelchair distance: 10 ft    Wheelchair 50 feet with 2 turns activity    Assist    Wheelchair 50 feet with 2 turns activity did not occur: Safety/medical concerns (unable to perform due to tone, strength, activity tolerance, and coordination deficits)       Wheelchair 150 feet activity     Assist  Wheelchair 150 feet activity did not occur: Safety/medical concerns (unable to perform due to tone, strength, activity tolerance, and coordination deficits)       Blood pressure 121/79, pulse 87, temperature (!) 97.5 F (36.4 C), temperature source Oral, resp. rate 18, height 5\' 9"  (1.753 m), weight 57.6 kg, SpO2 95 %.  Medical Problem List and Plan: 1. Functional deficits secondary to secondary progressive multiple sclerosis with spasticity and uncontrolled pain- and need to titration up of ITB pump - pt's level of function has been declining for last few months- and now is max-total A             -patient may  shower             -ELOS/Goals: min Assist 14-18 days  Con't CIR- PT and OT and SLP-- team conference today to determine length of stay.  2.  Antithrombotics: -DVT/anticoagulation:  Pharmaceutical: Lovenox             -antiplatelet therapy:  none 3. Pain/spasticity management:  -baclofen IT pump- will titrate while here             -baclofen 40 mg-  -oxycodone 15 mg - will increase to q4 hours prn for therapy.  -dantrolene 50 mg -tizanidine 4 mg 10/18- will add Baclofen 20 mg daily prn for during day when spasticity worse; also d/c ibuprofen and add Aleve 500 mg BID- at 6am/6pm 10/23- added methadone 2.5 mg BID on 10/21, increase to 5mg  today , pt has not taken prn oxy IR 15mg  will reduce to 10mg  given higher dose of methadone  10/24-  pain doing better with methadone and Oxy prn- con't regimen for now.  4. Mood/Behavior/Sleep: LCSW to evaluate and provide emotional support             -continue Wellbutrin XL 300 mg daily             -continue Lamictal 25 mg 2 tabs BID             -antipsychotic agents: n/a 5. Neuropsych/cognition: This patient is capable of making decisions on her own behalf. 6. Skin/Wound Care: Routine skin care checks 7. Fluids/Electrolytes/Nutrition: Routine Is and Os and follow-up chemistries             -continue vitamin supplements (C, D3, B12) 8: GERD: continue Zantac  10/24- add Pepcid and continue Protonix.  9: Neurogenic bladder: continue Foley catheter- last changed 03/01/22- will need to be changed prior to d/c.  10: Bowel program: continue psyllium, Miralax, Senekot  101/9- increased senna to 3 tabs daily and added miralax daily.  10/21- no BM since admission- will give Sorbitol and SSE if needed after therapy today  10/22- at least 2 mushy stools last night-  10/24- is oozing stool, so doesn't want to take bowel meds for a few days- I think she doesn't have much control, but worse when stool loose.  11: Edema: continue Lasix 40 mg daily- also OT explained that she was having a lot of stool without control in shower today- hopefully with that and possible another dose of Sorbitol after therapy today, she can get cleaned out.  12: Season allergies: continue Afrin PRN, Singulair daily 13:  Post-menopausal: continue Premarin vaginal cream  14. Dysphagia/resp issues- will get CXR and Slp eval for swallowing issues- had been on Nectar thick liquids prior.   10/20- MBS today  10/21- can stay on thin liquids with effortful swallows and secondary swallow-   10/24- on regular diet with secondary swallow  15. Tooth abscess- con't Flagyl- finish Friday 250 mg TID.  10/24- done  16. Hypokalemia  10/24- K+ 3.4 yesterday- will increase Kcl to 20 mEq daily.  17. ITB pump/Spasticity  10/19- will increase ITB pump today- increased from 386 mcg/day to 425 mcg/day- depending on her results, will see when to titrate her up next?  10/24- will increase ITB pump today- went up 10.1% to 468.5 mcg/day- new fill date 05/09/22    I spent a total of 56   minutes on total care today- >50% coordination of care- due to team conference to determine length of stay; increasing ITB pump and prolonged time with pt/nursing.     LOS: 7 days A FACE TO FACE EVALUATION WAS PERFORMED  Myishia Kasik 03/20/2022, 8:49 AM

## 2022-03-20 NOTE — Progress Notes (Signed)
Occupational Therapy Session Note  Patient Details  Name: Sharon Odom MRN: [id number] Date of Birth: 10-Sep-1970  Today's Date: 03/20/2022 OT Individual Time: 7062-3762 OT Individual Time Calculation (min): 72 min    Short Term Goals: Week 1:  OT Short Term Goal 1 (Week 1): STG=LTG d/t ELOS  Skilled Therapeutic Interventions/Progress Updates:  Patient received seated in power chair.  Patient indicated disappointment that PT scheduled first - so had to get her dressed.  Would have preferred OT first to be ready for the day.  Patient's MD indicates that she just titrated medication and results should be more evident tomorrow,   Patient taken to gym.  Patient reporting 9/10 pain in BLE - requesting pain medication - Nurse rought medication to patient in gym.  Transferred to mat table with min assist and increased time stepping/ pivoting primarilly with RLE,while LLE remained flexed at hip and knee.   In sitting worked on postural control.  Patient with windswept posture to left - worked on weight shifting and trunk activation toward extension and weight shift to right.  Patient with limited sustained activation into extension.  Worked on lateral flexion of left trunk with weight shift to right.  Patient visibly fatigued at end of session requiring mod assist squat pivot back to wheelchair. Patient able to use chair features to adjust posture.  Patient left up in room with personal items in reach.        03/19/22 03/20/22  LUE Tone   LUE Tone Modified Ashworth Modified Ashworth  Body Part - Modified Ashworth Scale Elbow;Wrist;Fingers;Thumb Elbow;Wrist;Fingers;Thumb  Modified Ashworth Scale for Grading Hypertonia LUE 4   Elbow - Modified Ashworth Scale for Grading Hypertonia LUE 4 2  Wrist - Modified Ashworth Scale for Grading Hypertonia LUE 0 0  Thumb - Modified Ashworth Scale for Grading Hypertonia LUE 0 0      03/19/22    RUE Tone   RUE Tone Modified Ashworth Modified Ashworth  Body Part  - Modified Ashworth Scale Elbow;Wrist;Fingers;Thumb Elbow;Wrist;Fingers;Thumb  Modified Ashworth Scale for Grading Hypertonia RUE 1   Elbow - Modified Ashworth Scale for Grading Hypertonia RUE 1 1  Wrist - Modified Ashworth Scale for Grading Hypertonia RUE 0 0  Fingers - Modified Ashworth Scale for Grading Hypertonia RUE 0 0  Thumb - Modified Ashworth Scale for Grading Hypertonia RUE     Therapy Documentation Precautions:  Precautions Precautions: Fall Restrictions Weight Bearing Restrictions: No  Pain: Pain Assessment Pain Scale: 0-10 Pain Score: 9 Pain Type: Chronic pain Pain Location: Generalized - BLE Pain Intervention(s): Medication (See eMAR)   Therapy/Group: Individual Therapy  Mariah Milling 03/20/2022, 7:25 AM

## 2022-03-20 NOTE — Progress Notes (Signed)
Orthopedic Tech Progress Note Patient Details:  Blondine Hottel March 20, 1971 [id number]  Called in order to [facility] to for a [place]   Patient ID: Sakeena Teall, female   DOB: [birth year], 51 y.o.   MRN: [id number]  Janit Pagan 03/20/2022, 2:48 PM

## 2022-03-20 NOTE — Progress Notes (Addendum)
Patient ID: Sharon Odom, female   DOB: 1970-10-20, 51 y.o.   MRN: [id number]  Met with pt to update her regarding team conference goals of min-mod level and discharge 10/30. She has a dental appointment 10/30 fro her permanent crown. Will check with husband and dentist and let us know if need to change discharge date. Made MD and team aware of this. Also made [facility] aware of her discharge, will resume PT services pt feels more beneficial than OT at home

## 2022-03-20 NOTE — Patient Care Conference (Signed)
Inpatient RehabilitationTeam Conference and Plan of Care Update Date: 03/20/2022   Time: 11:54 AM   Patient Name: Sharon Odom      Medical Record Number: [id number]  Date of Birth: [birth year] Sex: Female         Room/Bed: 4M08C/4M08C-01 Payor Info: Payor: [place] / Plan: BCBS OTHER / Product Type: *No Product type* /    Admit Date/Time:  03/13/2022  2:15 PM  Primary Diagnosis:  Multiple sclerosis [facility])  Hospital Problems: Principal Problem:   Multiple sclerosis ([place]) Active Problems:   S/P insertion of intrathecal pump    Expected Discharge Date: Expected Discharge Date: 03/26/22  Team Members Present: Physician leading conference: Dr. Courtney Heys Social Worker Present: Ovidio Kin, LCSW Nurse Present: Other (comment) Tacy Learn, RN) PT Present: Other (comment) Verl Dicker, PT) OT Present: Willeen Cass, OT SLP Present: Weston Anna, SLP PPS Coordinator present : Gunnar Fusi, SLP     Current Status/Progress Goal Weekly Team Focus  Bowel/Bladder   Patient is continent of bowel, last BM 10/23. Has foley catheter currently.  Remain continent.  Monitor for changes in bowel and bladder.   Swallow/Nutrition/ Hydration   Supervision A Goal met, regular and thin liquids ( xerostoma exaserbates oral/pharyngeal residue: oral care before/after, secondary swallow and small sips/bites) requested to focus on PT/OT  Supervision  A - goal met  education completed, d/c on 10/23 from [place] services   ADL's   occasional min A for UB dressing due to fasteners, set up grooming, CGA UB bathing in shower, mod A LB bathing, mod A fading to min A for toileting post BM- has Foley for urine mngt, Min A toilet and shower transfers to [facility], UB tone has remained stable  S transfers, min A toileting- needs a d/c date but most likely depends on MD titration- OT fine with that- HHOT rec and has all DME already  progress functional transfers with RW, improve functional time with  self care routine, falls prevention   Mobility   CGA rolling, min A sup to sit, CGA sit to stand in // bars, mod A stand pivot to PWC  CGA rolling, min A sup to sit, CGA sit to stand, mod A stand pivot to [place]  brace consult for left knee instability, transfers, gait, pt/fam education   Communication             Safety/Cognition/ Behavioral Observations            Pain   Patient has generalied pain 10/10. Has a baclofen pump and prn oxyir/baclofen.  Pain 5 or less.  Asseess pain q shift and prn.   Skin   Redness to buttock area with foam dressing in place.  No skin breakdown.  Assess skin q shift prn.     Discharge Planning:  HOme with husband who works during the day and pt is alone during this time. Feels can manage on her own while he is working   Team Discussion: MS. Continent of bowel. Constipation treated and now with stool leakage. Foley from home. Foley exchange before discharge. 10 out of 10 pain. Methadone added. Blanchable area to buttocks that was present on admission. Adjustment to baclofen pump x 2. Patient tolerated well. Potassium increased. Orthotic ordered for left knee instability.  Patient on target to meet rehab goals: yes  *See Care Plan and progress notes for long and short-term goals.   Revisions to Treatment Plan:  Medication adjustments, orthotic ordered  Teaching Needs: Medications, safety, skin/wound  care, gait/transfer training, etc.   Current Barriers to Discharge: Decreased caregiver support, Neurogenic bowel and bladder, and Wound care  Possible Resolutions to Barriers: Family education, skin/wound education, order recommended DME      Medical Summary Current Status: incontnent stool- oozing- has foley- pain doing better with methadone- will need to change foley before d/c-  Barriers to Discharge: Decreased family/caregiver support;Home enviroment access/layout;Medical stability;Weight;Wound care;Incontinence;Neurogenic Bowel & Bladder  Barriers  to Discharge Comments: foley- needs to be changed prior to d/c- legs 9/10 pain- but very weak- biggest barrier is L knee instability- but very weak in LLE- [facility] cming today to see if can brace LLE- appears to be delcining in function due to MS- more so for safety- can buckle any minute- L side lean- even in w/c- SLP for dysphagia- MBS regular thin/asked to stop SLP- d/c Monday 10/30- keeping due to medical issues Possible Resolutions to [employer] Focus: set OT goals CGA with hands- transfers min A- CGA- sit-stand min A-   Continued Need for Acute Rehabilitation Level of Care: The patient requires daily medical management by a physician with specialized training in physical medicine and rehabilitation for the following reasons: Direction of a multidisciplinary physical rehabilitation program to maximize functional independence : Yes Medical management of patient stability for increased activity during participation in an intensive rehabilitation regime.: Yes Analysis of laboratory values and/or radiology reports with any subsequent need for medication adjustment and/or medical intervention. : Yes   I attest that I was present, lead the team conference, and concur with the assessment and plan of the team.   Ernest Pine 03/20/2022, 4:26 PM

## 2022-03-20 NOTE — Progress Notes (Signed)
Physical Therapy Session Note  Patient Details  Name: Sharon Odom MRN: [id number] Date of Birth: 10-Jul-1970  Today's Date: 03/20/2022 PT Individual Time: 0804-0906 PT Individual Time Calculation (min): 62 min & 63 min   Short Term Goals: Week 1:  PT Short Term Goal 1 (Week 1): STG=LTG due ELOS  Skilled Therapeutic Interventions/Progress Updates:      Therapy Documentation Precautions:  Precautions Precautions: Fall Restrictions Weight Bearing Restrictions: No  Treatment Session 1:  Pt received semi-reclined in bed with nurse and MD present. Pt agreeable to PT session with emphasis on transfer training. Pt with decreased reports of LE pain following pain medication administration. Pt requires (S) for supine to sit edge of bed. Pt (S) for upper body dressing and min A for lower body dressing in standing position with RW. Pt CGA with sit to stand with RW and min A with stand pivot transfer to [place]. Pt continues to present with left LE instability and knee buckling in stance and plan for orthotics consult this afternoon. Pt participated in blocked practice of stand pivot transfers from bed <>w/c. PT encouraged pt to utilize squat pivot transfer and pt declined as she reports she is more comfortable with her RW. Pt left seated in PWC at bedside with all needs in reach.    Treatment Session 2:  Pt received seated in PWC at bedside reports unrated pain to L UE and BLE. PT notified nurse who provided patient with pain medication. Pt navigated PWC to main gym and orthotist, Gerald Stabs, present for orthotic evaluation. Pt performed 5 sit to stand transfers in parallel bars as orthotist evaluated left knee instability. With tactile and verbal cueing pt able to lock knee but unable to maintain position, therefore pt would benefit from automatic locking knee brace. Pt navigated PWC to room and left seated in w/c at bedside with all needs in reach.     03/20/22 1609  RLE Tone  RLE Tone Modified Ashworth   Body Part - Modified Ashworth Scale Hamstrings;Gastrocnemius  Hamstrings - Modified Ashworth Scale for Grading Hypertonia RLE 0  GASTROCNEMIUS - Modified Ashworth Scale for Grading Hypertonia RLE 3    03/20/22 1610  LLE Tone  LLE Tone Modified Ashworth  Body Part - Modified Ashworth Scale Hamstrings;Gastrocnemius  Hamstrings - Modified Ashworth Scale for Grading Hypertonia LLE 0  Gastrocnemius - Modified Ashworth Scale for Grading Hypertonia LLE 0     Therapy/Group: Individual Therapy  Verl Dicker Verl Dicker PT, DPT  03/20/2022, 7:41 AM

## 2022-03-21 DIAGNOSIS — G35 Multiple sclerosis: Secondary | ICD-10-CM | POA: Diagnosis not present

## 2022-03-21 MED ORDER — AMANTADINE HCL 100 MG PO CAPS
100.0000 mg | ORAL_CAPSULE | Freq: Three times a day (TID) | ORAL | Status: DC
  Administered 2022-03-21 – 2022-03-27 (×20): 100 mg via ORAL
  Filled 2022-03-21 (×20): qty 1

## 2022-03-21 MED ORDER — SENNOSIDES-DOCUSATE SODIUM 8.6-50 MG PO TABS
3.0000 | ORAL_TABLET | Freq: Every day | ORAL | Status: DC
  Administered 2022-03-22 – 2022-03-27 (×6): 3 via ORAL
  Filled 2022-03-21 (×7): qty 3

## 2022-03-21 NOTE — Progress Notes (Signed)
Physical Therapy Session Note  Patient Details  Name: Sharon Odom MRN: [id number] Date of Birth: 08/25/1970  Today's Date: 03/21/2022 PT Individual Time: 1303-1350 PT Individual Time Calculation (min): 47 min   Short Term Goals: Week 1:  PT Short Term Goal 1 (Week 1): STG=LTG due ELOS  Skilled Therapeutic Interventions/Progress Updates:      Therapy Documentation Precautions:  Precautions Precautions: Fall Restrictions Weight Bearing Restrictions: No   Pt received seated in PWC agreeable to PT session with emphasis on transfer training. Pt without verbal complaints of pain during session. PT assessed MAS scores and documented below. Pt navigated PWC with supervision for safety to main gym. Pt donned automatic locking knee orthoses to L LE and pt performed stand pivot transfer with RW from [place] to mat going to the right with min A. Pt continues to present with left knee instability and flexion with use of extension locking  brace and requires min A for transfer for safety. Pt requested PWC be positioned to her right in order to perform transfer back. PT educated pt on importance of performing transfers in bilateral directions and pt declines and states she will move the [place] on her own for transfers at home. Pt attempted return stand pivot to [facility] and presents with right knee buckling and unable to complete. PT educated pt on recommendation of performing squat pivot transfers instead of stand pivot due to bilateral knee instability and weakness. Pt not receptive to performing squat pivot transfers as she states she feels more safe with her RW. On second attempt pt able to perform transfer with min A to the right with RW and pt navigated PWC to room. Pt left seated in w/c at bedside with nursing present for repositioning. Pt declines use of knee locking brace as she reports its difficult to manage and she is fearful to use it.   Therapy/Group: Individual Therapy  Sharon Odom Sharon Odom PT, DPT  03/21/2022, 7:50 AM

## 2022-03-21 NOTE — Progress Notes (Signed)
Patient ID: Sharon Odom, female   DOB: May 05, 1971, 51 y.o.   MRN: [id number]  Pt has decided to go home on Sunday so she can go to her dental appointment on Monday morning. Have let team and MD know and will work on discharge needs for Sunday 10/29.

## 2022-03-21 NOTE — Progress Notes (Signed)
PROGRESS NOTE   Subjective/Complaints:  Still oozing stool- asked to reduce Senna- Has new knee brac-e hasn't used it yet.  Didn't sleep well- on and off, but better than night prior-  Fingers numb- new on R- worse on L side.     ROS:  Pt denies SOB, abd pain, CP, N/V/C/D, and vision changes  Except for HPI  Objective:   No results found. No results for input(s): "WBC", "HGB", "HCT", "PLT" in the last 72 hours.  Recent Labs    03/19/22 0602  NA 138  K 3.4*  CL 100  CO2 30  GLUCOSE 96  BUN 14  CREATININE 0.87  CALCIUM 9.2     Intake/Output Summary (Last 24 hours) at 03/21/2022 1249 Last data filed at 03/21/2022 0500 Gross per 24 hour  Intake --  Output 2125 ml  Net -2125 ml        Physical Exam: Vital Signs Blood pressure 90/62, pulse 72, temperature 97.6 F (36.4 C), temperature source Oral, resp. rate 18, height 5\' 9"  (1.753 m), weight 57.6 kg, SpO2 94 %.     General: awake, alert, appropriate, supine in bed; asleep initially- startled her from sleep; NAD HENT: conjugate gaze; oropharynx moist CV: regular rate; no JVD Pulmonary: CTA B/L; no W/R/R- good air movement GI: soft, NT, ND, (+)BS Psychiatric: appropriate- talkative Neurological: Ox3- MAS of 3 in RLE- better than prior Skin: L posterior thigh more irritated- is blanchable Neurologic: Cranial nerves II through XII intact, motor strength is 5/5 in R deltoid, bicep, tricep, grip,RLE 3- hip flexor,2- knee extensors, 0 ankle dorsiflexor and plantar flexor Trace Left HF, KE, 0/5 ankle DF  Sensory exam normal sensation to light touch and proprioception in bilateral upper and lower extremities Cerebellar exam normal finger to nose to finger as well as heel to shin in bilateral upper and lower extremities Musculoskeletal: Full range of motion in all 4 extremities. No joint swelling   Few beats clonus on LLE but 5-7 beats RLE Psych:  interactive; slightly flat affect; slightly depressed       Assessment/Plan: 1. Functional deficits which require 3+ hours per day of interdisciplinary therapy in a comprehensive inpatient rehab setting. Physiatrist is providing close team supervision and 24 hour management of active medical problems listed below. Physiatrist and rehab team continue to assess barriers to discharge/monitor patient progress toward functional and medical goals  Care Tool:  Bathing  Bathing activity did not occur: Refused Body parts bathed by patient: Right arm, Left arm, Chest, Abdomen, Front perineal area, Buttocks, Right upper leg, Left upper leg, Face   Body parts bathed by helper: Right lower leg, Left lower leg     Bathing assist Assist Level: Minimal Assistance - Patient > 75%     Upper Body Dressing/Undressing Upper body dressing   What is the patient wearing?: Pull over shirt, Bra    Upper body assist Assist Level: Contact Guard/Touching assist    Lower Body Dressing/Undressing Lower body dressing      What is the patient wearing?: Pants, Underwear/pull up     Lower body assist Assist for lower body dressing: Moderate Assistance - Patient 50 - 74%  Toileting Toileting    Toileting assist Assist for toileting: Moderate Assistance - Patient 50 - 74%     Transfers Chair/bed transfer  Transfers assist  Chair/bed transfer activity did not occur: Safety/medical concerns  Chair/bed transfer assist level: Minimal Assistance - Patient > 75%     Locomotion Ambulation   Ambulation assist   Ambulation activity did not occur: Safety/medical concerns (unable to perform due to tone, strength, activity tolerance, and coordination deficits)          Walk 10 feet activity   Assist  Walk 10 feet activity did not occur: Safety/medical concerns (unable to perform due to tone, strength, activity tolerance, and coordination deficits)        Walk 50 feet activity   Assist  Walk 50 feet with 2 turns activity did not occur: Safety/medical concerns (unable to perform due to tone, strength, activity tolerance, and coordination deficits)         Walk 150 feet activity   Assist Walk 150 feet activity did not occur: Safety/medical concerns (unable to perform due to tone, strength, activity tolerance, and coordination deficits)         Walk 10 feet on uneven surface  activity   Assist Walk 10 feet on uneven surfaces activity did not occur: Safety/medical concerns (unable to perform due to tone, strength, activity tolerance, and coordination deficits)         Wheelchair     Assist Is the patient using a wheelchair?: Yes Type of Wheelchair: Power    Wheelchair assist level: Supervision/Verbal cueing Max wheelchair distance: 10 ft    Wheelchair 50 feet with 2 turns activity    Assist    Wheelchair 50 feet with 2 turns activity did not occur:  (unable to perform due to tone, strength, activity tolerance, and coordination deficits)   Assist Level: Total Assistance - Patient < 25%   Wheelchair 150 feet activity     Assist  Wheelchair 150 feet activity did not occur:  (unable to perform due to tone, strength, activity tolerance, and coordination deficits)   Assist Level: Total Assistance - Patient < 25%   Blood pressure 90/62, pulse 72, temperature 97.6 F (36.4 C), temperature source Oral, resp. rate 18, height 5\' 9"  (1.753 m), weight 57.6 kg, SpO2 94 %.  Medical Problem List and Plan: 1. Functional deficits secondary to secondary progressive multiple sclerosis with spasticity and uncontrolled pain- and need to titration up of ITB pump - pt's level of function has been declining for last few months- and now is max-total A             -patient may  shower             -ELOS/Goals: min Assist 14-18 days  D/c date 10/29- Sunday per pt request  Con't CIR-, PT, OT- done with SLP  Willa dd amantadine per Dr 07-[birth year]-1975 100 mg TID 2.   Antithrombotics: -DVT/anticoagulation:  Pharmaceutical: Lovenox             -antiplatelet therapy: none 3. Pain/spasticity management:  -baclofen IT pump- will titrate while here             -baclofen 40 mg-  -oxycodone 15 mg - will increase to q4 hours prn for therapy.  -dantrolene 50 mg -tizanidine 4 mg 10/18- will add Baclofen 20 mg daily prn for during day when spasticity worse; also d/c ibuprofen and add Aleve 500 mg BID- at 6am/6pm 10/23- added methadone 2.5 mg BID on 10/21, increase  to 5mg  today , pt has not taken prn oxy IR 15mg  will reduce to 10mg  given higher dose of methadone 10/25- pain doing better- con't methadone and Oxy at lower dose.  4. Mood/Behavior/Sleep: LCSW to evaluate and provide emotional support             -continue Wellbutrin XL 300 mg daily             -continue Lamictal 25 mg 2 tabs BID             -antipsychotic agents: n/a 5. Neuropsych/cognition: This patient is capable of making decisions on her own behalf. 6. Skin/Wound Care: Routine skin care checks 7. Fluids/Electrolytes/Nutrition: Routine Is and Os and follow-up chemistries             -continue vitamin supplements (C, D3, B12) 8: GERD: continue Zantac  10/24- add Pepcid and continue Protonix.  9: Neurogenic bladder: continue Foley catheter- last changed 03/01/22- will need to be changed prior to d/c.   10/25- d/w nursing to get it changed Saturday 10: Bowel program: continue psyllium, Miralax, Sen[place]ot  101/9- increased senna to 3 tabs daily and added miralax daily.  10/21- no BM since admission- will give Sorbitol and SSE if needed after therapy today  10/22- at least 2 mushy stools last night-  10/24- is oozing stool, so doesn't want to take bowel meds for a few days- I think she doesn't have much control, but worse when stool loose.   10/25- will reduce Senna to 3 tabs/day since still oozing stool- think she doesn't have enough control of bowels.  11: Edema: continue Lasix 40 mg daily- also OT  explained that she was having a lot of stool without control in shower today- hopefully with that and possible another dose of Sorbitol after therapy today, she can get cleaned out.  12: Season allergies: continue Afrin PRN, Singulair daily 13: Post-menopausal: continue Premarin vaginal cream  14. Dysphagia/resp issues- will get CXR and Slp eval for swallowing issues- had been on Nectar thick liquids prior.   10/20- MBS today  10/21- can stay on thin liquids with effortful swallows and secondary swallow-   10/24- on regular diet with secondary swallow  15. Tooth abscess- con't Flagyl- finish Friday 250 mg TID.  10/24- done  16. Hypokalemia  10/24- K+ 3.4 yesterday- will increase Kcl to 20 mEq daily.  17. ITB pump/Spasticity  10/19- will increase ITB pump today- increased from 386 mcg/day to 425 mcg/day- depending on her results, will see when to titrate her up next?  10/24- will increase ITB pump today- went up 10.1% to 468.5 mcg/day- new fill date 05/09/22     I spent a total of  52   minutes on total care today- >50% coordination of care- due to prolonged d/w Dr 11/19- her Neurologist- starting Amantadine. Also need to wean Wellbutrin since already on MS medicine.  And prolonged d/w pt about bowels and decline in MS.  Will also call [facility] tomorrow to discuss increase in ITB and new refill date.    LOS: 8 days A FACE TO FACE EVALUATION WAS PERFORMED  Woodrow Dulski 03/21/2022, 12:49 PM

## 2022-03-21 NOTE — Progress Notes (Signed)
Physical Therapy Session Note  Patient Details  Name: Sharon Odom MRN: [id number] Date of Birth: [birth year]  Today's Date: 03/21/2022 PT Individual Time: 1045-1130 PT Individual Time Calculation (min): 45 min   Short Term Goals: Week 1:  PT Short Term Goal 1 (Week 1): STG=LTG due ELOS  Skilled Therapeutic Interventions/Progress Updates:  Patient greeted sitting upright in PWC in room and agreeable to PT treatment session. Prior to exiting room- Patient demonstrated appropriate ability to direct her care in organizing her room and properly positioning her tray table. Recreational therapist present during 2/3 of treatment session providing education and assisting with coping strategies.   Patient was able to scoot herself posteriorly in [place] by tilting posteriorly and having therapist lift L LE while performing a wheelchair push-up and scooting posteriorly in the [place].   Patient was ModI with [place] mobility to/from her room and rehab gym- Required assistance for managing foot plates.   Patient performed stand pivot transfer to/from [facility] and mat table with RW and Min/ModA- Patient demonstrates minimal to no movement of L LE with significant buckling of L LE when WB, however able to compensate by pushing up with B UE on the RW.   Patient sat edge of mat table without UE support and good dynamic stability. Patient noted to have elongated L trunk with shortened R tunk and R lateral lean. Patient presents with this lateral lean while seated in the [place] and reports this is new within the past 3 months. Patient has a new PWC with lateral thigh supports, but not lateral trunk supports- reports she received PWC from [place] with [name].   While sitting EOM, patient reached overhead and across her body with R UE in order to facilitate increased stretch of R lateral trunk. Therapist applying a downward force/pressure on R hip and facilitating elongation of L lateral trunk throughout dynamic sitting  balance tasks, performed with x15+ bean bags with recreational therapist sitting in front of patient managing bean bags.    Patient left sitting upright in [place] with recreational therapist present to assist patient back to her room with all needs met.     Therapy Documentation Precautions:  Precautions Precautions: Fall Restrictions Weight Bearing Restrictions: No  Therapy/Group: Individual Therapy  Keionte Swicegood 03/21/2022, 8:21 AM

## 2022-03-21 NOTE — Progress Notes (Signed)
Recreational Therapy Session Note  Patient Details  Name: Sharon Odom MRN: [id number] Date of Birth: 04-06-1971 Today's Date: 03/21/2022  Pain: c/o LUE pain at end of the session, nurse made aware, repositioning provided Skilled Therapeutic Interventions/Progress Updates: Session focused on activity tolerance, dynamic sitting balance, R trunk elongation and coping.  Pt utilized power chair for mobility on the unit with supervision.  pt performed stand pivot transfer with RW to/from mat with min-mod assist.  Pt with significant L knee buckling and limited ability to offset weight through BUEs due to weakness.  Once seated EOM, pt performed reaching activity in a attempt to stretch right trunk.  Pt required mod-max assist to promote trunk elongation.  Pt returned to her room via power chair and provided with set up assistance prior to lunch.  Also discussed discharge planning and pt concerns.  Concerns shared with PT, OT and SW to address further.  Pain addressed with pt nurse. Therapy/Group: Co-Treatment   Petra Dumler 03/21/2022, 1:19 PM

## 2022-03-21 NOTE — Progress Notes (Signed)
Occupational Therapy Weekly Progress Note  Patient Details  Name: Sharon Odom MRN: [id number] Date of Birth: June 14, 1970  Beginning of progress report period: March 14, 2022 End of progress report period: March 21, 2022  Today's Date: 03/21/2022 OT Individual Time: 0900-1009 OT Individual Time Calculation (min): 69 min    Patient has met 1 of 4 short term goals as STG's = LTG's and all goals progressing well. OT has been tracking MAS which has had no change for UE's however pain management has contributed to functional activity tolerance.   Patient continues to demonstrate the following deficits: muscle weakness and muscle paralysis, impaired timing and sequencing, abnormal tone, unbalanced muscle activation, and decreased coordination, and decreased sitting balance, decreased standing balance, decreased postural control, and decreased balance strategies and therefore will continue to benefit from skilled OT intervention to enhance overall performance with BADL.  Patient progressing toward long term goals..  Continue plan of care.  OT Short Term Goals Week 2:  OT Short Term Goal 1 (Week 1): STG=LTG d/t ELOS  Pt seen for comprehensive skilled OT session with weekly reassessment, MAS and progression of toileting and toilet transfers with modified squat pivot transfers versus SPT which PT reports is unsafe until L LE knee locking brace is implemented in their later session. OT education in use of reacher for LE reach for self care for LB garment management. Pt progressing toward all goals with strong focus on toileting and toilet transfers this visit. All tasks with increased time and effort. Pt utilizes significant forward weight shift for squat pivot with mod fading to min A and mod cues as well as for buttocks and peri hygiene with similar status.Mod A for LB Depends and pull on pants with UE support and R UE use primarily for pants management.  Cam NT arrived following transfer back to  power w/c to continue to set pt up sink side to complete remainder of safety set up and care.   Skilled Therapeutic Interventions/Progress Updates:       03/20/22 03/21/22  LUE Tone    LUE Tone Modified [place] Modified [place]  Body Part - Modified [place] Scale Elbow;Wrist;Fingers;Thumb Elbow;Wrist;Fingers;Thumb  Modified [place] Scale for Grading Hypertonia LUE 2  3  Elbow - Modified [place] Scale for Grading Hypertonia LUE 2 3  Wrist - Modified [place] Scale for Grading Hypertonia LUE 0 0  Thumb - Modified [place] Scale for Grading Hypertonia LUE 0 0      03/20/22  03/21/22   RUE Tone    RUE Tone Modified [place] Modified [place]  Body Part - Modified [place] Scale Elbow;Wrist;Fingers;Thumb Elbow;Wrist;Fingers;Thumb  Modified [place] Scale for Grading Hypertonia RUE 1  1  Elbow - Modified [place] Scale for Grading Hypertonia RUE 1 1  Wrist - Modified [place] Scale for Grading Hypertonia RUE 0 0  Fingers - Modified [place] Scale for Grading Hypertonia RUE 0 0  Thumb - Modified [place] Scale for Grading Hypertonia       Therapy Documentation Precautions:  Precautions Precautions: Fall Restrictions Weight Bearing Restrictions: No   Therapy/Group: Individual Therapy  Barnabas Lister 03/21/2022, 7:46 AM

## 2022-03-21 NOTE — Discharge Summary (Signed)
Physician Discharge Summary  Patient ID: Sharon Odom MRN: [id number] DOB/AGE: [birth year] 51 y.o.  Admit date: 03/13/2022 Discharge date: 03/27/2022  Discharge Diagnoses:  Principal Problem:   Multiple sclerosis (HCC) Active Problems:   S/P insertion of intrathecal pump Functional deficits secondary to MS Spasticity Mood disorder GERD Neurogenic bladder Peripheral edema Seasonal allergies Post-menopause UTI  Discharged Condition: stable  Significant Diagnostic Studies: MR BRAIN W WO CONTRAST  Result Date: 03/26/2022 CLINICAL DATA:  Left greater than right spasticity in the setting of progressive MS. EXAM: MRI HEAD WITHOUT AND WITH CONTRAST TECHNIQUE: Multiplanar, multiecho pulse sequences of the brain and surrounding structures were obtained without and with intravenous contrast. CONTRAST:  6mL GADAVIST GADOBUTROL 1 MMOL/ML IV SOLN COMPARISON:  MRI Brain 11/12/21 FINDINGS: Brain: No acute infarction, hemorrhage, hydrocephalus, extra-axial collection or mass lesion. Redemonstrated are extensive T2/FLAIR hyperintense periventricular and subcortical T2/FLAIR hyperintense lesions which have a morphology that is consistent with a demyelinating process. No diffusion restricted lesions are seen. There is a focal contrast-enhancing lesion in the left occipital lobe (series 21, image 26). There are multiple lesions fat demonstrate marked hypointensity on T1 weighted imaging, suggestive chronic volume loss. T2 hyperintense lesions are also seen in the pons in the visualized cervical spinal cord. Vascular: Normal flow voids. Skull and upper cervical spine: Normal marrow signal. Sinuses/Orbits: Negative. Other: None. IMPRESSION: Extensive T2/FLAIR hyperintense periventricular and subcortical lesions which have a morphology that is consistent with a demyelinating process. There is a focal contrast-enhancing lesion in the left occipital lobe, which could suggest active demyelination active  demyelination. Electronically Signed   By: Lorenza Cambridge M.D.   On: 03/26/2022 17:02   DG Chest 2 View  Result Date: 03/24/2022 CLINICAL DATA:  Fever today. EXAM: CHEST - 2 VIEW COMPARISON:  Radiograph 03/13/2022 FINDINGS: Lower lung volumes from prior exam. Mild patchy bibasilar opacities. The heart is normal in size. Normal mediastinal contours allowing for rotation. No large pleural effusion. No pneumothorax. Remote right posterior rib fractures. IMPRESSION: Mild patchy bibasilar opacities, may be atelectasis or pneumonia in the setting of fever. Electronically Signed   By: Narda Rutherford M.D.   On: 03/24/2022 09:45   DG Abd 1 View  Result Date: 03/22/2022 CLINICAL DATA:  Abdominal pain and distention EXAM: ABDOMEN - 1 VIEW COMPARISON:  11/06/2021 FINDINGS: Bowel gas pattern is nonspecific. Large amount of stool is seen in colon. There is no dilation of small-bowel loops. There is an electronic device overlying the right iliac bone which was also seen in previous study. No abnormal masses or calcifications are seen. Kidneys are mostly obscured by bowel contents limiting evaluation for small renal stones. Possible rectal tube is seen. IMPRESSION: Nonspecific bowel gas pattern. Large amount of stool is seen in colon. Electronically Signed   By: Ernie Avena M.D.   On: 03/22/2022 15:07   DG Chest Port 1 View  Result Date: 03/13/2022 CLINICAL DATA:  Rhonchi EXAM: PORTABLE CHEST 1 VIEW COMPARISON:  06/18/2021 FINDINGS: The heart size and mediastinal contours are within normal limits. Both lungs are clear. Old right-sided rib fractures. Punctate square shaped metallic density overlying the fifth vertebral body. IMPRESSION: No active disease. Punctate metallic density overlying the mid mediastinal region is indeterminate for artifact or foreign body, consider two-view chest follow-up for further assessment. Electronically Signed   By: Jasmine Pang M.D.   On: 03/13/2022 16:10    Labs:  Basic  Metabolic Panel: Recent Labs  Lab 03/23/22 0553 03/26/22 0506  NA 140 143  K 3.9  4.0  CL 102 104  CO2 31 27  GLUCOSE 91 91  BUN 12 19  CREATININE 0.87 1.20*  CALCIUM 9.5 9.1    CBC: Recent Labs  Lab 03/24/22 1119 03/26/22 0506  WBC 10.1 8.0  NEUTROABS 8.1* 5.9  HGB 11.6* 10.2*  HCT 35.8* 31.4*  MCV 98.9 97.5  PLT 341 309     Brief HPI:   Sharon Odom is a 51 y.o. female with a history of secondary progressive multiple sclerosis. She has had significant left greater than right spasticity with subsequent implantation of intrathecal pump for baclofen administration. She underwent revision of the abdominal portion of the pump by Dr. Jake Samples on 01/11/2022. At the time of follow up on 10/6, she reported worsening pain especially when sitting despite increase in 5% increase in baclofen pump. She is admitted for titration of IC baclofen pump for spasticity.   Hospital Course: Sharon Odom was admitted to rehab 03/13/2022 for inpatient therapies to consist of PT, ST and OT at least three hours five days a week. Past admission physiatrist, therapy team and rehab RN have worked together to provide customized collaborative inpatient rehab. Significant stiffness in the mornings and thigh pain. Oxycodone increased to every 4 hours as needed for therapy. Baclofen 20 mg increased daily prn. Ibuprofen discontinued and naprosyn 500 mg BID continued. Chest x-ray and SLP eval obtained for dysphagia. Tolerating regular diet and thin liquids after MBS done on 10/20. Hypokalemia corrected with oral supplementation. ITB pump increased on 10/19 to 386 mcg/day without significant improvement. Started methadone 2.5 mg BID on 10/21. Pump increased to 425 mcg/day on 10/22. Neuropsychological eval obtained on 10/22. Methadone increased to 5 mg BID and oxycodone decreased to 10 mg doses on 10/23 with improvement. Locking knee brace placed on left. Increased in pump does to 468.5 mg (10.1%) on 10/24. Advanced to  regular diet. Call into Dr. Epimenio Foot on 10/25 and amantadine 100 mg TID started. Potassium supplementation increased to 20 mEq daily on 10/24. Normal serum potassium on 10/27. Foley cath removed and replaced and urine sent for culture due to worsening dysarthria and sleepiness. Macrodantin started. Klebsiella and Enterobacter isolated. Macrodantin discontinued and started cefepime due to fever spike to 101.2 early on 10/28. Nucynta started. Increased drowsiness noted 10/29 after single dose.  Blood cultures negative. MRI ordered 10/30. This showed acute demyelination and Solumedrol 1000 mg daily for 5 days started on 10/31. Unable to participate in therapy therefore transferred to acute hospital bed.  Blood pressures were monitored on TID basis and remained stable.  Hypoglycemic event noted 10/31. SSI/CBGs started with initiation of steroids on 10/31.  Rehab course: During patient's stay in rehab weekly team conferences were held to monitor patient's progress, set goals and discuss barriers to discharge. At admission, patient required mod-max for basic self-care skills, mod with mobility.  She has had no improvement in activity tolerance, balance, postural control as well as ability to compensate for deficits. She has had improvement in functional use RUE/LUE  and RLE/LLE as well as improvement in awareness.   Disposition: Discharge disposition: 02-[facility]     Meds: Symmetrel 100 mg TID Vit C 1000 mg daily Lioresal 20-40 mg PO custom and 40 mg a t2 pm Wellbutrin XL 300 mg daily Maxipime 2 grams q 12 hours Vit D  5.000 units daily Vit B12 2500 mcg daily Dalfampridine TB12 10 mg BID Valium 7.5 mg q 8 hours Lovenox 40 mg daily Pepcid 40 mg daily Lasix 80  mg daily Novolog 0-15 SSI Methadone 5 mg q 12 hours Solu-Medrol 1000 mg Iv daily times 5 days Singulair 10 mg daily Naprosyn 500 mg BID Protonix 40 mg daily Miralax 17 g daily Klor-con 20 mEq daily Senekot-s 3  tabs daily Zanaflex 4 mg TID Sanctura 20 mg custom  PRN: Tylenol Albuterol nebs Lioresol 20 mg Robaxin 500 mg q 6 hours Maalox/Mylanta Tums Robaxin 500 mg q 6 hours Nucynta 50 mg q 4 hours prn Trazodone 25-50 mg q HS    Diet: Regular  Special Instructions:  No driving, alcohol consumption or tobacco use.   30-35 minutes were spent on discharge planning and discharge summary.      Follow-up Information     Lovorn, Aundra Millet, MD. Go to.   Specialty: Physical Medicine and Rehabilitation Why: 11/8 Contact information: [address] [place] [address] [phone]         Asa Lente, MD. Go to.   Specialty: Neurology Why: 11/8 Contact information: [address] [place] [address] [phone]         Charisse March, NP-C Follow up.   Specialty: Family Medicine Why: Call office on Monday to make arrangements for hospital follow-up appointment. Contact information: [address] [facility] [address] [place] [address] [phone]                 Signed: Milinda Antis 03/27/2022, 2:30 PM

## 2022-03-21 NOTE — Telephone Encounter (Signed)
error 

## 2022-03-22 ENCOUNTER — Inpatient Hospital Stay (HOSPITAL_COMMUNITY): Payer: BLUE CROSS/BLUE SHIELD

## 2022-03-22 DIAGNOSIS — G35 Multiple sclerosis: Secondary | ICD-10-CM | POA: Diagnosis not present

## 2022-03-22 MED ORDER — SORBITOL 70 % SOLN
45.0000 mL | Freq: Once | Status: AC
  Administered 2022-03-22: 45 mL via ORAL

## 2022-03-22 NOTE — Progress Notes (Signed)
Ordered 44fr foley catheter kit for exchanged on Saturday. Patient to discharge Sunday.

## 2022-03-22 NOTE — Progress Notes (Signed)
PROGRESS NOTE   Subjective/Complaints:  Pt reports rough am- crying about pain- PT went to see if can get pain meds.  Tried LLE knee brace- didn't work well- fearful of it locking and donning/doffing brace- and wasn't viable- didn't help knee buckling much per PT.     ROS:  Pt denies SOB, abd pain, CP, N/V/C/D, and vision changes   Except for HPI  Objective:   No results found. No results for input(s): "WBC", "HGB", "HCT", "PLT" in the last 72 hours.  No results for input(s): "NA", "K", "CL", "CO2", "GLUCOSE", "BUN", "CREATININE", "CALCIUM" in the last 72 hours.    Intake/Output Summary (Last 24 hours) at 03/22/2022 1037 Last data filed at 03/22/2022 0411 Gross per 24 hour  Intake 240 ml  Output 800 ml  Net -560 ml        Physical Exam: Vital Signs Blood pressure 98/69, pulse 84, temperature 97.9 F (36.6 C), resp. rate 18, height 5\' 9"  (1.753 m), weight 60.4 kg, SpO2 96 %.      General: awake, alert, tearful- sitting EOB; leaning severely to L; PT in room; NAD HENT: conjugate gaze; oropharynx moist CV: regular rate; no JVD Pulmonary: CTA B/L; no W/R/R- good air movement GI: soft, NT, ND, (+)BS- normoactive- not full of stool; ITB pump notable Psychiatric: appropriate Neurological: Ox3- MAS 2-3 in RLE -a little better- and 0-1 in LLE Skin: L posterior thigh more irritated- is blanchable Neurologic: Cranial nerves II through XII intact, motor strength is 5/5 in R deltoid, bicep, tricep, grip,RLE 3- hip flexor,2- knee extensors, 0 ankle dorsiflexor and plantar flexor Trace Left HF, KE, 0/5 ankle DF  Sensory exam normal sensation to light touch and proprioception in bilateral upper and lower extremities Cerebellar exam normal finger to nose to finger as well as heel to shin in bilateral upper and lower extremities Musculoskeletal: Full range of motion in all 4 extremities. No joint swelling   Few beats  clonus on LLE but 5-7 beats RLE Psych: interactive; slightly flat affect; slightly depressed       Assessment/Plan: 1. Functional deficits which require 3+ hours per day of interdisciplinary therapy in a comprehensive inpatient rehab setting. Physiatrist is providing close team supervision and 24 hour management of active medical problems listed below. Physiatrist and rehab team continue to assess barriers to discharge/monitor patient progress toward functional and medical goals  Care Tool:  Bathing  Bathing activity did not occur: Refused Body parts bathed by patient: Right arm, Left arm, Chest, Abdomen, Front perineal area, Buttocks, Right upper leg, Left upper leg, Face   Body parts bathed by helper: Right lower leg, Left lower leg     Bathing assist Assist Level: Minimal Assistance - Patient > 75%     Upper Body Dressing/Undressing Upper body dressing   What is the patient wearing?: Pull over shirt, Bra    Upper body assist Assist Level: Contact Guard/Touching assist    Lower Body Dressing/Undressing Lower body dressing      What is the patient wearing?: Pants, Underwear/pull up     Lower body assist Assist for lower body dressing: Moderate Assistance - Patient 50 - 74%  Toileting Toileting    Toileting assist Assist for toileting: Moderate Assistance - Patient 50 - 74%     Transfers Chair/bed transfer  Transfers assist  Chair/bed transfer activity did not occur: Safety/medical concerns  Chair/bed transfer assist level: Minimal Assistance - Patient > 75%     Locomotion Ambulation   Ambulation assist   Ambulation activity did not occur: Safety/medical concerns (unable to perform due to tone, strength, activity tolerance, and coordination deficits)          Walk 10 feet activity   Assist  Walk 10 feet activity did not occur: Safety/medical concerns (unable to perform due to tone, strength, activity tolerance, and coordination deficits)         Walk 50 feet activity   Assist Walk 50 feet with 2 turns activity did not occur: Safety/medical concerns (unable to perform due to tone, strength, activity tolerance, and coordination deficits)         Walk 150 feet activity   Assist Walk 150 feet activity did not occur: Safety/medical concerns (unable to perform due to tone, strength, activity tolerance, and coordination deficits)         Walk 10 feet on uneven surface  activity   Assist Walk 10 feet on uneven surfaces activity did not occur: Safety/medical concerns (unable to perform due to tone, strength, activity tolerance, and coordination deficits)         Wheelchair     Assist Is the patient using a wheelchair?: Yes Type of Wheelchair: Power    Wheelchair assist level: Supervision/Verbal cueing Max wheelchair distance: 10 ft    Wheelchair 50 feet with 2 turns activity    Assist    Wheelchair 50 feet with 2 turns activity did not occur:  (unable to perform due to tone, strength, activity tolerance, and coordination deficits)   Assist Level: Total Assistance - Patient < 25%   Wheelchair 150 feet activity     Assist  Wheelchair 150 feet activity did not occur:  (unable to perform due to tone, strength, activity tolerance, and coordination deficits)   Assist Level: Total Assistance - Patient < 25%   Blood pressure 98/69, pulse 84, temperature 97.9 F (36.6 C), resp. rate 18, height 5\' 9"  (1.753 m), weight 60.4 kg, SpO2 96 %.  Medical Problem List and Plan: 1. Functional deficits secondary to secondary progressive multiple sclerosis with spasticity and uncontrolled pain- and need to titration up of ITB pump - pt's level of function has been declining for last few months- and now is max-total A             -patient may  shower             -ELOS/Goals: min Assist 14-18 days  D/c date 10/29- Sunday per pt request  Con't CIR- PT and OT- Amantadine started yesterday- no side effects 2.   Antithrombotics: -DVT/anticoagulation:  Pharmaceutical: Lovenox             -antiplatelet therapy: none 3. Pain/spasticity management:  -baclofen IT pump- will titrate while here             -baclofen 40 mg-  -oxycodone 15 mg - will increase to q4 hours prn for therapy.  -dantrolene 50 mg -tizanidine 4 mg 10/18- will add Baclofen 20 mg daily prn for during day when spasticity worse; also d/c ibuprofen and add Aleve 500 mg BID- at 6am/6pm 10/23- added methadone 2.5 mg BID on 10/21, increase to 5mg  today , pt has not taken prn  oxy IR 15mg  will reduce to 10mg  given higher dose of methadone 10/25- pain doing better- con't methadone and Oxy at lower dose.   10/26- more pain this AM, but once got pain meds, was feeling a little better- -con't regimen- don't feel comfortable increasing methadone for 1 month- send home on Oxycodone 10 mg pills 4. Mood/Behavior/Sleep: LCSW to evaluate and provide emotional support             -continue Wellbutrin XL 300 mg daily             -continue Lamictal 25 mg 2 tabs BID             -antipsychotic agents: n/a 5. Neuropsych/cognition: This patient is capable of making decisions on her own behalf. 6. Skin/Wound Care: Routine skin care checks 7. Fluids/Electrolytes/Nutrition: Routine Is and Os and follow-up chemistries             -continue vitamin supplements (C, D3, B12) 8: GERD: continue Zantac  10/24- add Pepcid and continue Protonix.  9: Neurogenic bladder: continue Foley catheter- last changed 03/01/22- will need to be changed prior to d/c.   10/25- d/w nursing to get it changed Saturday 10: Bowel program: continue psyllium, Miralax, Senekot  101/9- increased senna to 3 tabs daily and added miralax daily.  10/21- no BM since admission- will give Sorbitol and SSE if needed after therapy today  10/22- at least 2 mushy stools last night-  10/24- is oozing stool, so doesn't want to take bowel meds for a few days- I think she doesn't have much control, but  worse when stool loose.   10/25- will reduce Senna to 3 tabs/day since still oozing stool- think she doesn't have enough control of bowels.  11: Edema: continue Lasix 40 mg daily- also OT explained that she was having a lot of stool without control in shower today- hopefully with that and possible another dose of Sorbitol after therapy today, she can get cleaned out.  12: Season allergies: continue Afrin PRN, Singulair daily 13: Post-menopausal: continue Premarin vaginal cream  14. Dysphagia/resp issues- will get CXR and Slp eval for swallowing issues- had been on Nectar thick liquids prior.   10/20- MBS today  10/21- can stay on thin liquids with effortful swallows and secondary swallow-   10/24- on regular diet with secondary swallow  15. Tooth abscess- con't Flagyl- finish Friday 250 mg TID.  10/24- done  16. Hypokalemia  10/24- K+ 3.4 yesterday- will increase Kcl to 20 mEq daily.   10/26- will recheck in AM 17. ITB pump/Spasticity  10/19- will increase ITB pump today- increased from 386 mcg/day to 425 mcg/day- depending on her results, will see when to titrate her up next?  10/24- will increase ITB pump today- went up 10.1% to 468.5 mcg/day- new fill date 05/09/22   10/26- increased to 491.4 mcg/day- new refill date 05/07/22- increased 5%   I spent a total of  55  minutes on total care today- >50% coordination of care- due to titrating ITB by 5%- also calling [facility] to see if can do clonidine in ITB pump? Also spoke with PT and OT about function- cannot use LLE knee brace- didn't help much.    LOS: 9 days A FACE TO FACE EVALUATION WAS PERFORMED  Sharon Odom 03/22/2022, 10:37 AM

## 2022-03-22 NOTE — Progress Notes (Signed)
Recreational Therapy Session Note  Patient Details  Name: Sharon Odom MRN: [id number] Date of Birth: Jul 25, 1970 Today's Date: 03/22/2022  Pain:  c/o intermittent pain/spasms.  PT provided passive ROM/stretching to help with relief.  Pt in bed apologizing for having a bad day as pt describes today as not being a good one.  PT performed testing and ROM stretches while CTRS/LRT provided emotional support.  Pt tearful throughout this session.  Pt upset that she wasn't able to participate more in therapies today and described feelings of loneliness due to a lack of support.  Pt stated that she and her husband relocated here, away from family and friends for his job and she has not made any social connections.  Pt appreciative of the support provided by PT and CTRS/LRT and apologizing for tearfulness.  Emotional support and encouragement provided.  Ghislaine Harcum 03/22/2022, 4:17 PM

## 2022-03-22 NOTE — Progress Notes (Signed)
Occupational Therapy Session Note  Patient Details  Name: Sharon Odom MRN: [id number] Date of Birth: 06-09-1970  Today's Date: 03/22/2022 OT Individual Time: 4403-4742 1st Session; 1300-1330 2nd Session  OT Individual Time Calculation (min): 60 min, 30 min    Short Term Goals: Week 2:  STG=LTG's d/t LOS   Skilled Therapeutic Interventions/Progress Updates:  1st Session:   Pt up in w/c upon OT arrival after having PT earlier this am. Pain reported as 10/10 in L LE. Pain meds given and LE's elevated in power chair. MAS testing had to be completed power w/c level in reclined position. Strong focus this session on sink side standing for peri and buttocks bathing, LB dressing, skin protection management for L IT breakdown. OT donned TED hose prior to sink side bathing and dressing re-training. Pt able to stand with sink side support x 6 intervals ~ 1 min each then requiring rest breaks during LB bathing and dressing. Increased time and effort with AE and LE's perched on sink counter for improved reach. OT assisted at times with Foley bag management for time conservation. Nursing student arrived to assess L IT breakdown area and removed and reapplied foam protection dressing with OT reinforcing pressure reliefs. OT set up bra to donn with clasps in front then MD arrived and performed baclofen pump titration. NT called to assist with completion of set up for UB self care.   2nd Session:   Pt seen for toileting retraining focussed session this pm. Pt completing lunch meal and pt concerned she is having stool smearing vs full BM's and requesting and scan by MD and nursing reports will be conducted later this pm. Pt used power w/c to access 3 in 1 over toilet and with increased time and effort was able to perform squat pivot transfer with min A, LB pull on pants and Depends mngt down and up with min a and wiping buttocks after trial with min A. Stood 4 trials with RW of 1.5 minutes for LB garment  management with cues for upright posture due to L knee flexion and windswept posture. Pain level improved to 8/10 from early am with nursing administering pain meds at start of this session. Left pt power w/c level with nursing and NT Viri.    MAS:    03/21/22 03/22/22  LUE Tone    LUE Tone Modified Ashworth Modified Ashworth  Body Part - Modified Ashworth Scale Elbow;Wrist;Fingers;Thumb Elbow;Wrist;Fingers;Thumb  Modified Ashworth Scale for Grading Hypertonia LUE 3  3  Elbow - Modified Ashworth Scale for Grading Hypertonia LUE 3 3  Wrist - Modified Ashworth Scale for Grading Hypertonia LUE 0 0  Thumb - Modified Ashworth Scale for Grading Hypertonia LUE 0 0      03/21/22  03/22/22   RUE Tone    RUE Tone Modified Ashworth Modified Ashworth  Body Part - Modified Ashworth Scale Elbow;Wrist;Fingers;Thumb Elbow;Wrist;Fingers;Thumb  Modified Ashworth Scale for Grading Hypertonia RUE 1  1  Elbow - Modified Ashworth Scale for Grading Hypertonia RUE 1 1  Wrist - Modified Ashworth Scale for Grading Hypertonia RUE 0 0  Fingers - Modified Ashworth Scale for Grading Hypertonia RUE 0 0  Thumb - Modified Ashworth Scale for Grading Hypertonia         Therapy Documentation Precautions:  Precautions Precautions: Fall Restrictions Weight Bearing Restrictions: No   Therapy/Group: Individual Therapy  Barnabas Lister 03/22/2022, 7:53 AM

## 2022-03-22 NOTE — Progress Notes (Signed)
Physical Therapy Discharge Summary  Patient Details  Name: Sharon Odom MRN: [id number] Date of Birth: [birth year]  Date of Discharge from PT service:March 24, 2022   Patient has met 5 of 5 long term goals due to ability to compensate for deficits. Pt to discharge at min A  wheelchair level. Pt is mod I with power w/c management and navigation. Pt requires min/mod A with bed mobility, CGA with sit to stand transfers, and min A with stand pivot transfers with RW. Pt presents with bilateral knee instability with L LE more impaired than right. During admission pt particpated in evaluation by orthotist, who recommended use of automatic knee locking orthoses to manage knee buckling with stand pivot transfers. Pt agreeable to attempt orthose and declined to utilize upon discharge. Pt with significant knee instability and buckling with stand pivot transfers and PT educated pt on benefit of performing squat pivot transfers instead to increase safety. Pt informed of the risks  associated with continuation of stand pivot transfers ( ex: falling) and pt continues to decline squat pivot transfers and prefers to utilize her RW for stand pivot. Pt care partner unable to participate in family training and plan for pt to discharge home with HHPT.    Reasons goals not met: N/A. Ambulatory goal discharged as pt not safe to practice gait training due to significant LE weakness. Pt to discharge at wheelchair level.   Recommendation:  Patient will benefit from ongoing skilled PT services in home health setting to continue to advance safe functional mobility, address ongoing impairments in flexibility, strength, balance, coordination, activity tolerance, and minimize fall risk.  Equipment: No equipment provided  Reasons for discharge: treatment goals met and discharge from hospital  Patient/family agrees with progress made and goals achieved: Yes  PT Discharge Pain Interference Pain Interference Pain Effect on  Sleep: 4. Almost constantly Pain Interference with Therapy Activities: 4. Almost constantly Pain Interference with Day-to-Day Activities: 4. Almost constantly Cognition Overall Cognitive Status: History of cognitive impairments - at baseline Arousal/Alertness: Awake/alert Orientation Level: Oriented X4 Memory: Impaired Awareness: Appears intact Problem Solving: Appears intact Safety/Judgment: Impaired Sensation Sensation Light Touch: Impaired by gross assessment Additional Comments: diminshed L LE all dermatomes Coordination Gross Motor Movements are Fluid and Coordinated: No Fine Motor Movements are Fluid and Coordinated: No Coordination and Movement Description: grossly uncoordinated due to increased spasticity, weakness and balance deficits Finger Nose Finger Test: R UE dysmetria, unable to perform L UE Heel Shin Test: R LE decreased ROM and delayed, unable to perform L LE Motor  Motor Motor: Abnormal tone Motor - Skilled Clinical Observations: PMH of Secondary Progressive MS with increased spasticity Motor - Discharge Observations: Generalized weakness and limited with increased spasticity 2/2 progressive MS  Mobility Bed Mobility Bed Mobility: Rolling Right;Rolling Left;Sit to Supine;Supine to Sit Rolling Right: Moderate Assistance - Patient 50-74% Rolling Left: Minimal Assistance - Patient > 75% Supine to Sit: Minimal Assistance - Patient > 75% Sit to Supine: Minimal Assistance - Patient > 75% Transfers Transfers: Sit to Stand;Stand to Sit;Stand Pivot Transfers Sit to Stand: Contact Guard/Touching assist Stand to Sit: Contact Guard/Touching assist Stand Pivot Transfers: Minimal Assistance - Patient > 75% Stand Pivot Transfer Details: Visual cues/gestures for sequencing;Verbal cues for precautions/safety Transfer (Assistive device): Rolling walker Locomotion  Gait Ambulation: No Gait Gait: No Stairs / Additional Locomotion Stairs: No Pick up small object from the  floor (from standing position) activity did not occur: Safety/medical concerns (unable to perform due to tone, strength, activity tolerance, and  coordination deficits) Architect: Yes Wheelchair Assistance: Independent with Camera operator: Power Wheelchair Parts Management: Independent Distance: 300 ft  Trunk/Postural Assessment  Cervical Assessment Cervical Assessment: Exceptions to [facility] (forward head) Thoracic Assessment Thoracic Assessment: Exceptions to [facility] (increased R trunk lateral flexion and decreased L trunk flexion) Lumbar Assessment Lumbar Assessment: Exceptions to [facility] (posterior pelvic tilt) Postural Control Postural Control: Deficits on evaluation Righting Reactions: decreased Protective Responses: decreased  Balance Balance Balance Assessed: Yes Static Sitting Balance Static Sitting - Balance Support: Feet supported Static Sitting - Level of Assistance: 5: Stand by assistance (SBA) Dynamic Sitting Balance Dynamic Sitting - Balance Support: Feet supported Dynamic Sitting - Level of Assistance: 4: Min assist Dynamic Sitting - Balance Activities: Lateral lean/weight shifting;Forward lean/weight shifting Static Standing Balance Static Standing - Balance Support: During functional activity Static Standing - Level of Assistance: 4: Min assist Dynamic Standing Balance Dynamic Standing - Balance Support: During functional activity Dynamic Standing - Level of Assistance: 4: Min assist Dynamic Standing - Balance Activities: Forward lean/weight shifting;Lateral lean/weight shifting Extremity Assessment  RLE Assessment RLE Assessment: Exceptions to [facility] General Strength Comments: grossly 2/5 except ankle DF/PF 4/5 RLE Tone RLE Tone: Modified Ashworth Body Part - Modified Ashworth Scale: Hamstrings;Gastrocnemius Hamstrings - Modified Ashworth Scale for Grading Hypertonia RLE: Slight increase in muscle tone, manifested by a  catch and release or by minimal resistance at the end of the range of motion when the affected part(s) is moved in flexion or extension GASTROCNEMIUS - Modified Ashworth Scale for Grading Hypertonia RLE: More marked increase in muscle tone through most of the ROM, but affected part(s) easily moved LLE Assessment LLE Assessment: Exceptions to [facility] General Strength Comments: grossly 1/5 except ankle DF/PF 0/5 LLE Tone LLE Tone: Modified Ashworth Hamstrings - Modified Ashworth Scale for Grading Hypertonia LLE: No increase in muscle tone Gastrocnemius - Modified Ashworth Scale for Grading Hypertonia LLE: No increase in muscle tone   Verl Dicker Verl Dicker PT, DPT  03/22/2022, 4:17 PM

## 2022-03-22 NOTE — Progress Notes (Signed)
PA and MD aware of x-ray results.    Yehuda Mao, LPN

## 2022-03-22 NOTE — Progress Notes (Signed)
Physical Therapy Session Note  Patient Details  Name: Sharon Odom MRN: [id number] Date of Birth: [birth year]  Today's Date: 03/21/2022 PT Individual Time: 1635-1735   45 min   Short Term Goals: Week 1:  PT Short Term Goal 1 (Week 1): STG=LTG due ELOS  Skilled Therapeutic Interventions/Progress Updates:   Pt received sitting in WC and agreeable to PT. Pt performed power WC mobility through hall 2 x 135f with cues for speed control to prepare to roll into parallel bars. Sit<>stand x 4 in parallel bars with min assist from PT and moderate cues for improved posture and knee/hip extension on the LLE. Pt perform pregait stepping in parallel bars 2 x 5 BLE with DF wrap on the LLE with improved movement of the LLE noted with LLE head in DF. Pt may benefit from foot up brace. Pt transported to room in power WC as listed above. Performed stand pivot transfer to and from Bed with min assist from PT with decreased knee buckling on the LLE compared to prior session with this PT.[facility]         Therapy Documentation Precautions:  Precautions Precautions: Fall Restrictions Weight Bearing Restrictions: No    Pain: Denies at rest    Therapy/Group: Individual Therapy  ALorie Phenix10/26/2023, 8:24 AM

## 2022-03-22 NOTE — Progress Notes (Signed)
Physical Therapy Session Note  Patient Details  Name: Sharon Odom MRN: [id number] Date of Birth: Feb 28, 1971  Today's Date: 03/22/2022 PT Individual Time: 0804-0901 PT Individual Time Calculation (min): 57 min & 78 min   Short Term Goals: Week 1:  PT Short Term Goal 1 (Week 1): STG=LTG due ELOS  Skilled Therapeutic Interventions/Progress Updates:      Therapy Documentation Precautions:  Precautions Precautions: Fall Restrictions Weight Bearing Restrictions: No  Treatment Session 1:  Pt received semi-reclined in bed, agreeable to PT session and reports unrated pain to LUE, and BLE's. PT notified nursing who administered pain medication mid- session. MD present during PT session for AM rounds. Patient required increased time for initiation, cuing, rest breaks, and for completion of tasks throughout session. Utilized therapeutic use of self throughout to promote efficiency. Pt required min A for supine to sit. Pt requires close (S) for static sitting edge of bed and CGA/min A for dynamic sitting balance as MD assessed LE spasticity. PT provided support and comfort as pt emotional throughout session regarding medical progress. Pt performed stand pivot with RW min A and left seated in PWC at bedside with nursing present.   Treatment Session 2:  Pt received semi-reclined in bed following abdominal imaging. Pt reports unrated pain to BLE's and pt pre-medicated. Pt tearful throughout session and PT provided emotional support and comfort with recreational therapist. PT assessed pain interference, sensation, coordination, strength, and balance as part of discharge preparation. Pt reports pain related to spasticity and requested therapist assisted passive range of motion. PT provided PROM hip, knee and ankle bilaterally and pt reports decreased pain. Pt requests to transfer to [facility] and required min/mod A with rolling and min A for supine to sit. In standing, observed soiled brief and pt instructed to  return to bed and required min A for sit to lying. PT notified nursing and pt left in their care for peri-care and brief changing.    MAS  R LE hamstrings: 1  R LE gastroc: 2  L LE hamstrings and gastroc: 0     Therapy/Group: Individual Therapy  Verl Dicker Verl Dicker PT, DPT  03/22/2022, 7:40 AM

## 2022-03-22 NOTE — Progress Notes (Addendum)
Soap suds enema given. Awaiting results.  0639. No results from enema. Pt states that she does feel a little pressure.

## 2022-03-23 ENCOUNTER — Other Ambulatory Visit (HOSPITAL_COMMUNITY): Payer: Self-pay

## 2022-03-23 DIAGNOSIS — G35 Multiple sclerosis: Secondary | ICD-10-CM | POA: Diagnosis not present

## 2022-03-23 LAB — URINALYSIS, ROUTINE W REFLEX MICROSCOPIC
Bilirubin Urine: NEGATIVE
Glucose, UA: NEGATIVE mg/dL
Hgb urine dipstick: NEGATIVE
Ketones, ur: NEGATIVE mg/dL
Nitrite: POSITIVE — AB
Protein, ur: NEGATIVE mg/dL
Specific Gravity, Urine: 1.013 (ref 1.005–1.030)
pH: 6 (ref 5.0–8.0)

## 2022-03-23 LAB — BASIC METABOLIC PANEL
Anion gap: 7 (ref 5–15)
BUN: 12 mg/dL (ref 6–20)
CO2: 31 mmol/L (ref 22–32)
Calcium: 9.5 mg/dL (ref 8.9–10.3)
Chloride: 102 mmol/L (ref 98–111)
Creatinine, Ser: 0.87 mg/dL (ref 0.44–1.00)
GFR, Estimated: >60 mL/min (ref >60)
Glucose, Bld: 91 mg/dL (ref 70–99)
Potassium: 3.9 mmol/L (ref 3.5–5.1)
Sodium: 140 mmol/L (ref 135–145)

## 2022-03-23 MED ORDER — FUROSEMIDE 40 MG PO TABS
80.0000 mg | ORAL_TABLET | Freq: Every day | ORAL | 0 refills | Status: DC
  Filled 2022-03-23 – 2022-03-26 (×2): qty 30, 15d supply, fill #0

## 2022-03-23 MED ORDER — METHADONE HCL 5 MG PO TABS
5.0000 mg | ORAL_TABLET | Freq: Two times a day (BID) | ORAL | 0 refills | Status: DC
  Filled 2022-03-23: qty 30, 15d supply, fill #0

## 2022-03-23 MED ORDER — METHADONE HCL 5 MG PO TABS
5.0000 mg | ORAL_TABLET | Freq: Two times a day (BID) | ORAL | 0 refills | Status: DC
  Filled 2022-03-23 – 2022-03-26 (×2): qty 60, 30d supply, fill #0

## 2022-03-23 MED ORDER — AMANTADINE HCL 100 MG PO CAPS
100.0000 mg | ORAL_CAPSULE | Freq: Three times a day (TID) | ORAL | 0 refills | Status: DC
  Filled 2022-03-23 – 2022-03-26 (×2): qty 60, 20d supply, fill #0

## 2022-03-23 MED ORDER — METHOCARBAMOL 500 MG PO TABS
500.0000 mg | ORAL_TABLET | Freq: Four times a day (QID) | ORAL | 0 refills | Status: DC | PRN
  Filled 2022-03-23 – 2022-03-26 (×2): qty 30, 8d supply, fill #0

## 2022-03-23 MED ORDER — VITAMIN B-12 2500 MCG SL SUBL: 2500.0000 ug | SUBLINGUAL_TABLET | Freq: Every day | SUBLINGUAL | 0 refills | Status: DC

## 2022-03-23 MED ORDER — BACLOFEN 20 MG PO TABS: 20.0000 mg | ORAL_TABLET | Freq: Two times a day (BID) | ORAL | 0 refills | Status: DC

## 2022-03-23 MED ORDER — LACTULOSE 10 GM/15ML PO SOLN
30.0000 g | Freq: Once | ORAL | Status: AC
  Administered 2022-03-23: 30 g via ORAL
  Filled 2022-03-23: qty 45

## 2022-03-23 MED ORDER — POTASSIUM CHLORIDE CRYS ER 20 MEQ PO TBCR
20.0000 meq | EXTENDED_RELEASE_TABLET | ORAL | 0 refills | Status: AC
  Filled 2022-03-23 – 2022-03-26 (×2): qty 15, 30d supply, fill #0

## 2022-03-23 MED ORDER — NITROFURANTOIN MONOHYD MACRO 100 MG PO CAPS
100.0000 mg | ORAL_CAPSULE | Freq: Two times a day (BID) | ORAL | Status: DC
  Administered 2022-03-23 – 2022-03-24 (×2): 100 mg via ORAL
  Filled 2022-03-23 (×5): qty 1

## 2022-03-23 MED ORDER — POLYETHYLENE GLYCOL 3350 17 G PO PACK: 17.0000 g | PACK | Freq: Every day | ORAL | 0 refills | Status: DC

## 2022-03-23 MED ORDER — CARBOXYMETHYLCELLULOSE SODIUM 0.5 % OP SOLN: 1.0000 [drp] | Freq: Three times a day (TID) | OPHTHALMIC | Status: DC | PRN

## 2022-03-23 MED ORDER — NAPROXEN 500 MG PO TABS: 500.0000 mg | ORAL_TABLET | Freq: Two times a day (BID) | ORAL | Status: DC

## 2022-03-23 MED ORDER — BACLOFEN 20 MG PO TABS: 40.0000 mg | ORAL_TABLET | Freq: Every day | ORAL | 0 refills | Status: DC

## 2022-03-23 MED ORDER — METAMUCIL 0.36 G PO CAPS: 5.0000 | ORAL_CAPSULE | Freq: Every day | ORAL | Status: DC

## 2022-03-23 MED ORDER — SORBITOL 70 % SOLN
960.0000 mL | TOPICAL_OIL | Freq: Once | ORAL | Status: AC
  Administered 2022-03-23: 960 mL via RECTAL
  Filled 2022-03-23: qty 240

## 2022-03-23 MED ORDER — CALCIUM CARBONATE ANTACID 500 MG PO CHEW: 1.0000 | CHEWABLE_TABLET | Freq: Three times a day (TID) | ORAL | Status: AC | PRN

## 2022-03-23 MED ORDER — SENNOSIDES-DOCUSATE SODIUM 8.6-50 MG PO TABS: 3.0000 | ORAL_TABLET | Freq: Every day | ORAL | Status: DC

## 2022-03-23 NOTE — Progress Notes (Signed)
Occupational Therapy Session Note  Patient Details  Name: Sharon Odom MRN: [id number] Date of Birth: May 05, 1971  Today's Date: 03/23/2022 OT Individual Time: 1st session 1000-1100; 2nd Session 1415-1530 OT Individual Time Calculation (min): 60 min, 75 min    Short Term Goals: Week 2:   STG=LTG d/t LOS   Skilled Therapeutic Interventions/Progress Updates:  1st Session:  Pt seen for skilled OT session with focus on shower access with 3 in 1 commode set in to attempt BM as well as pt has not had a BM since last Saturday. Meds given and all staff onboard to assist this day. Plan is also to continue enemas and change Foley catheter after shower bed level. Nursing requests pt back to bed lft undressed lower body. TR [name] also present this visit for support as pt has had some episodes of frustration with current pain and constipation issues. Pt mod I to access bathroom bia power chair. Squat pivot transfer with min A and increased time and effort on and off 3 in 1 set in shower. Pt issued LHS and able to complete bathing with increased time and effort seated with + functional reach to buttocks with forward lean with close S. Pt able to dry body and donn bra with fasteners in front and turn as well as pull over shirt with set up.. No BM completed. MAS performed power w/c level with no changes from last session testing. See below.  NT and Nurse assisted pt back to bed for Foley replacement at end of OT session. Pain 8/10 B LE's with meds given earlier.   MAS:     03/22/22 03/23/22  LUE Tone    LUE Tone Modified Ashworth Modified Ashworth  Body Part - Modified Ashworth Scale Elbow;Wrist;Fingers;Thumb Elbow;Wrist;Fingers;Thumb  Modified Ashworth Scale for Grading Hypertonia LUE 3  3  Elbow - Modified Ashworth Scale for Grading Hypertonia LUE 3 3  Wrist - Modified Ashworth Scale for Grading Hypertonia LUE 0 0  Thumb - Modified Ashworth Scale for Grading Hypertonia LUE 0 0      03/22/22  03/23/22    RUE Tone    RUE Tone Modified Ashworth Modified Ashworth  Body Part - Modified Ashworth Scale Elbow;Wrist;Fingers;Thumb Elbow;Wrist;Fingers;Thumb  Modified Ashworth Scale for Grading Hypertonia RUE 1  1  Elbow - Modified Ashworth Scale for Grading Hypertonia RUE 1 1  Wrist - Modified Ashworth Scale for Grading Hypertonia RUE 0 0  Fingers - Modified Ashworth Scale for Grading Hypertonia RUE 0 0  Thumb - Modified Ashworth Scale for Grading Hypertonia         2nd Session:   Pt seen for pm OT skilled visit with discharge planning initiated as Grad Day tomorrow 10/28 for d/c Sun 10/29. OT arrived and pt just completing nursing changing out Foley with pt still bed level after shower earlier. No BM thus far. While nursing cleaned up. OT wrote out comprehensive b UE HEP including breathing for CDP and mindfulness/relaxation/pain and spacticity mngt, simple tand, light tputty and AROM and FMC components. OT initiated education and left on windowsill for last visit tomorrow for review. OT performed another attempt to assist pt in BM and set up 3 in 1 commode next to bed to mirror home set up. Pt on and off commode with increased time and effort via modified SPT with min A. Pt also performed hair drying with hair dryer with indep level while on commode. Pt did have small BM and able to complete hygiene with forward lean with set  up only. Once back in bed, pt left with nursing in sidelying to administer enema.   Therapy Documentation Precautions:  Precautions Precautions: Fall Restrictions Weight Bearing Restrictions: No    Therapy/Group: Individual Therapy  Vicenta Dunning 03/23/2022, 7:51 AM

## 2022-03-23 NOTE — Progress Notes (Addendum)
PROGRESS NOTE   Subjective/Complaints:  Pt reports need to call husband No BM with sorbitol/SSE.   Will check U/A and Cx- to make sure neuro worse not due ot infection.   Also really feels constipated, but can still eat OK.    ROS:   Pt denies SOB, abd pain, CP, N/V/C/D, and vision changes    Except for HPI  Objective:   DG Abd 1 View  Result Date: 03/22/2022 CLINICAL DATA:  Abdominal pain and distention EXAM: ABDOMEN - 1 VIEW COMPARISON:  11/06/2021 FINDINGS: Bowel gas pattern is nonspecific. Large amount of stool is seen in colon. There is no dilation of small-bowel loops. There is an electronic device overlying the right iliac bone which was also seen in previous study. No abnormal masses or calcifications are seen. Kidneys are mostly obscured by bowel contents limiting evaluation for small renal stones. Possible rectal tube is seen. IMPRESSION: Nonspecific bowel gas pattern. Large amount of stool is seen in colon. Electronically Signed   By: Ernie Avena M.D.   On: 03/22/2022 15:07   No results for input(s): "WBC", "HGB", "HCT", "PLT" in the last 72 hours.  Recent Labs    03/23/22 0553  NA 140  K 3.9  CL 102  CO2 31  GLUCOSE 91  BUN 12  CREATININE 0.87  CALCIUM 9.5      Intake/Output Summary (Last 24 hours) at 03/23/2022 1123 Last data filed at 03/22/2022 1413 Gross per 24 hour  Intake --  Output 400 ml  Net -400 ml        Physical Exam: Vital Signs Blood pressure 92/65, pulse 77, temperature (!) 97.5 F (36.4 C), temperature source Oral, resp. rate 18, height 5\' 9"  (1.753 m), weight 60.4 kg, SpO2 96 %.       General: awake, alert, appropriate, dry mouth, but more dysarthric, even after giving PO and Biotene; NAD HENT: conjugate gaze; oropharynx moist CV: regular rate; no JVD Pulmonary: CTA B/L; no W/R/R- good air movement GI: more firm- ITB in place- but has distension with  hypoactive BS Psychiatric: appropriate Neurological: Ox3'- severe L lean- still notable. Dysarthria worse and more sleepy this AM Skin: L posterior thigh more irritated- is blanchable Neurologic: Cranial nerves II through XII intact, motor strength is 5/5 in R deltoid, bicep, tricep, grip,RLE 3- hip flexor,2- knee extensors, 0 ankle dorsiflexor and plantar flexor Trace Left HF, KE, 0/5 ankle DF  Sensory exam normal sensation to light touch and proprioception in bilateral upper and lower extremities Cerebellar exam normal finger to nose to finger as well as heel to shin in bilateral upper and lower extremities Musculoskeletal: Full range of motion in all 4 extremities. No joint swelling   Few beats clonus on LLE but 5-7 beats RLE Psych: interactive; slightly flat affect; slightly depressed       Assessment/Plan: 1. Functional deficits which require 3+ hours per day of interdisciplinary therapy in a comprehensive inpatient rehab setting. Physiatrist is providing close team supervision and 24 hour management of active medical problems listed below. Physiatrist and rehab team continue to assess barriers to discharge/monitor patient progress toward functional and medical goals  Care Tool:  Bathing  Bathing  activity did not occur: Refused Body parts bathed by patient: Right arm, Left arm, Chest, Abdomen, Front perineal area, Buttocks, Right upper leg, Left upper leg, Face   Body parts bathed by helper: Right lower leg, Left lower leg     Bathing assist Assist Level: Minimal Assistance - Patient > 75%     Upper Body Dressing/Undressing Upper body dressing   What is the patient wearing?: Pull over shirt, Bra    Upper body assist Assist Level: Contact Guard/Touching assist    Lower Body Dressing/Undressing Lower body dressing      What is the patient wearing?: Pants, Underwear/pull up     Lower body assist Assist for lower body dressing: Moderate Assistance - Patient 50 - 74%      Toileting Toileting    Toileting assist Assist for toileting: Minimal Assistance - Patient > 75%     Transfers Chair/bed transfer  Transfers assist  Chair/bed transfer activity did not occur: Safety/medical concerns  Chair/bed transfer assist level: Minimal Assistance - Patient > 75%     Locomotion Ambulation   Ambulation assist   Ambulation activity did not occur: Safety/medical concerns (unable to perform due to tone, strength, activity tolerance, and coordination deficits)          Walk 10 feet activity   Assist  Walk 10 feet activity did not occur: Safety/medical concerns (unable to perform due to tone, strength, activity tolerance, and coordination deficits)        Walk 50 feet activity   Assist Walk 50 feet with 2 turns activity did not occur: Safety/medical concerns (unable to perform due to tone, strength, activity tolerance, and coordination deficits)         Walk 150 feet activity   Assist Walk 150 feet activity did not occur: Safety/medical concerns (unable to perform due to tone, strength, activity tolerance, and coordination deficits)         Walk 10 feet on uneven surface  activity   Assist Walk 10 feet on uneven surfaces activity did not occur: Safety/medical concerns (unable to perform due to tone, strength, activity tolerance, and coordination deficits)         Wheelchair     Assist Is the patient using a wheelchair?: Yes Type of Wheelchair: Power    Wheelchair assist level: Independent Max wheelchair distance: 300 ft    Wheelchair 50 feet with 2 turns activity    Assist    Wheelchair 50 feet with 2 turns activity did not occur:  (unable to perform due to tone, strength, activity tolerance, and coordination deficits)   Assist Level: Independent   Wheelchair 150 feet activity     Assist  Wheelchair 150 feet activity did not occur:  (unable to perform due to tone, strength, activity tolerance, and  coordination deficits)   Assist Level: Independent   Blood pressure 92/65, pulse 77, temperature (!) 97.5 F (36.4 C), temperature source Oral, resp. rate 18, height 5\' 9"  (1.753 m), weight 60.4 kg, SpO2 96 %.  Medical Problem List and Plan: 1. Functional deficits secondary to secondary progressive multiple sclerosis with spasticity and uncontrolled pain- and need to titration up of ITB pump - pt's level of function has been declining for last few months- and now is max-total A             -patient may  shower             -ELOS/Goals: min Assist 14-18 days  D/c date 10/29- Sunday per pt request  Con't CIR- PT, OT - done with SLP  Will call husband today- will need HH set up for home- let SW know if possible 2.  Antithrombotics: -DVT/anticoagulation:  Pharmaceutical: Lovenox             -antiplatelet therapy: none 3. Pain/spasticity management:  -baclofen IT pump- will titrate while here             -baclofen 40 mg-  -oxycodone 15 mg - will increase to q4 hours prn for therapy.  -dantrolene 50 mg -tizanidine 4 mg 10/18- will add Baclofen 20 mg daily prn for during day when spasticity worse; also d/c ibuprofen and add Aleve 500 mg BID- at 6am/6pm 10/23- added methadone 2.5 mg BID on 10/21, increase to 5mg  today , pt has not taken prn oxy IR 15mg  will reduce to 10mg  given higher dose of methadone 10/25- pain doing better- con't methadone and Oxy at lower dose.   10/26- more pain this AM, but once got pain meds, was feeling a little better- -con't regimen- don't feel comfortable increasing methadone for 1 month- send home on Oxycodone 10 mg pills 10/27- pt said having more sleepiness- said can decide over weekend if goes back to just oxy or continue methadone/Oxy dosing.  4. Mood/Behavior/Sleep: LCSW to evaluate and provide emotional support             -continue Wellbutrin XL 300 mg daily             -continue Lamictal 25 mg 2 tabs BID             -antipsychotic agents: n/a 5.  Neuropsych/cognition: This patient is capable of making decisions on her own behalf. 6. Skin/Wound Care: Routine skin care checks 7. Fluids/Electrolytes/Nutrition: Routine Is and Os and follow-up chemistries             -continue vitamin supplements (C, D3, B12) 8: GERD: continue Zantac  10/24- add Pepcid and continue Protonix.  9: Neurogenic bladder: continue Foley catheter- last changed 03/01/22- will need to be changed prior to d/c.   10/25- d/w nursing to get it changed Saturday 10: Bowel program: continue psyllium, Miralax, Senekot  101/9- increased senna to 3 tabs daily and added miralax daily.  10/21- no BM since admission- will give Sorbitol and SSE if needed after therapy today  10/22- at least 2 mushy stools last night-  10/24- is oozing stool, so doesn't want to take bowel meds for a few days- I think she doesn't have much control, but worse when stool loose.   10/25- will reduce Senna to 3 tabs/day since still oozing stool- think she doesn't have enough  control of bowels.  also OT explained that she was having a lot of stool without control in shower today- hopefully with that and possible another dose of Sorbitol after therapy today, she can get cleaned out.   10/27- No results with sorbitol or SSE- will try Lactulose 30G and see if results- if none, will need more intervention 11: Edema: continue Lasix 40 mg daily- also OT explained that she was having a lot of stool without control in shower today- hopefully with that and possible another dose of Sorbitol after therapy today, she can get cleaned out.  12: Season allergies: continue Afrin PRN, Singulair daily 13: Post-menopausal: continue Premarin vaginal cream  14. Dysphagia/resp issues- will get CXR and Slp eval for swallowing issues- had been on Nectar thick liquids prior.   10/20- MBS today  10/21- can stay on thin  liquids with effortful swallows and secondary swallow-   10/24- on regular diet with secondary swallow  15.  Tooth abscess- con't Flagyl- finish Friday 250 mg TID.  10/24- done  16. Hypokalemia  10/24- K+ 3.4 yesterday- will increase Kcl to 20 mEq daily.   10/26- will recheck in AM  10/27- K 3.9 17. ITB pump/Spasticity  10/19- will increase ITB pump today- increased from 386 mcg/day to 425 mcg/day- depending on her results, will see when to titrate her up next?  10/24- will increase ITB pump today- went up 10.1% to 468.5 mcg/day- new fill date 05/09/22   10/26- increased to 491.4 mcg/day- new refill date 05/07/22- increased 5%  10/27- likely making her constipation much worse 18 Has foley- but more sleepy  10/27- will change foley then check U/A and Cx to make sure not having a UTI which would make dysarthria/sleepiness worse. Since had bowel incontinence, is possible.    I spent a total of  52   minutes on total care today- >50% coordination of care- due to d/w Nursing and PA about stool issues Also called husband- d/w him that this is progression of MS, not ITB pump-    HAVE A LOW THRESHOLD for starting ABX for possible UTI- looks equivocal for colonization vs beginnings of UTI  LOS: 10 days A FACE TO FACE EVALUATION WAS PERFORMED  Austyn Perriello 03/23/2022, 11:23 AM

## 2022-03-23 NOTE — Progress Notes (Signed)
Patient ID: Sharon Odom, female   DOB: Dec 26, 1970, 51 y.o.   MRN: [id number]  Attempted to meet with pt but having pain issues and not feeling well. Message from RT regarding discussing Advanced Directives and will give packet for this. Pt is a full code may need MD to discuss this with her and her options.

## 2022-03-23 NOTE — Progress Notes (Signed)
Recreational Therapy Discharge Summary Patient Details  Name: Sharon Odom MRN: [id number] Date of Birth: 1970-06-17 Today's Date: 03/23/2022   Comments on progress toward goals: Pt participated in TR services during LOS with an emphasis on activity tolerance, dynamic sitting balance and stress management.  Pt requires set up assist for simple tasks bed and power chair level.  Pt independently directs her care.  Pt is scheduled for discharge home with husband 10/29.  Reasons for discharge: discharge from hospital  Follow-up: [facility] agrees with progress made and goals achieved: Yes  [name] 03/23/2022, 12:35 PM

## 2022-03-23 NOTE — Progress Notes (Signed)
Occupational Therapy Discharge Summary  Patient Details  Name: Sharon Odom MRN: [id number] Date of Birth: 11/29/1970  Date of Discharge from OT service:March 24, 2022  {CHL IP REHAB OT TIME CALCULATIONS:[id number]}   Patient has met 6 of 6 long term goals due to improved activity tolerance, improved balance, postural control, ability to compensate for deficits, and functional use of  LEFT upper and LEFT lower extremity.  Patient to discharge at [facility] Assist level.  Patient's care partner is independent to provide the necessary physical assistance at discharge.  Pt has had issues this stay with pain management and constipation however this has not impacted pt's OT goal achievement as pt, given proper set up and increased time has surpassed goals set.   Reasons goals not met: n/a   Recommendation:  Patient will benefit from ongoing skilled OT services in home health setting to continue to advance functional skills in the area of BADL, iADL, and Reduce care partner burden.  Equipment: LH sponge otherwise pt has all needed DME   Reasons for discharge: treatment goals met  Patient/family agrees with progress made and goals achieved: Yes  OT Discharge Precautions/Restrictions    General   Vital Signs Therapy Vitals Temp: (!) 97.5 F (36.4 C) Temp Source: Oral Pulse Rate: 77 Resp: 18 BP: 92/65 Patient Position (if appropriate): Sitting Oxygen Therapy SpO2: 96 % O2 Device: Room Air Pain Pain Assessment Pain Scale: 0-10 Pain Score: 10-Worst pain ever Pain Intervention(s): Medication (See eMAR) ADL ADL Grooming: Supervision/safety (to wash face only) Where Assessed-Grooming: Other (comment) (shower) Upper Body Bathing: Contact guard Where Assessed-Upper Body Bathing: Shower Lower Body Bathing: Minimal assistance (to wash lower legs) Where Assessed-Lower Body Bathing: Shower (seated on BSC) Upper Body Dressing: Moderate assistance Where Assessed-Upper Body  Dressing: Other (Comment) (BSC in shower) Lower Body Dressing: Contact guard (to doff socks and brief) Where Assessed-Lower Body Dressing: Wheelchair Toileting: Minimal assistance Where Assessed-Toileting: Other (Comment) (shower) Toilet Transfer: Minimal assistance Toilet Transfer Method: Engineer, water: Bedside commode, Other (comment) ([place]) Gaffer Transfer: Environmental education officer Method: Radiographer, therapeutic: Other (comment), Grab bars [facility]) Vision   Perception    Praxis   Cognition   Sensation   Motor    Mobility     Trunk/Postural Assessment     Balance   Extremity/Trunk Assessment       Barnabas Lister 03/23/2022, 7:50 AM

## 2022-03-23 NOTE — Plan of Care (Signed)
  Problem: RH Balance Goal: LTG: Patient will maintain dynamic sitting balance (OT) Description: LTG:  Patient will maintain dynamic sitting balance with assistance during activities of daily living (OT) Outcome: Completed/Met   Problem: Sit to Stand Goal: LTG:  Patient will perform sit to stand in prep for activites of daily living with assistance level (OT) Description: LTG:  Patient will perform sit to stand in prep for activites of daily living with assistance level (OT) Outcome: Completed/Met   Problem: RH Toileting Goal: LTG Patient will perform toileting task (3/3 steps) with assistance level (OT) Description: LTG: Patient will perform toileting task (3/3 steps) with assistance level (OT)  Outcome: Completed/Met   Problem: RH Functional Use of Upper Extremity Goal: LTG Patient will use RT/LT upper extremity as a (OT) Description: LTG: Patient will use right/left upper extremity as a stabilizer/gross assist/diminished/nondominant/dominant level with assist, with/without cues during functional activity (OT) Outcome: Completed/Met   Problem: RH Tub/Shower Transfers Goal: LTG Patient will perform tub/shower transfers w/assist (OT) Description: LTG: Patient will perform tub/shower transfers with assist, with/without cues using equipment (OT) Outcome: Completed/Met

## 2022-03-23 NOTE — TOC Transition Note (Signed)
Discharge medications (4) are being stored in the main pharmacy on the ground floor until patient is ready for discharge.   

## 2022-03-23 NOTE — Progress Notes (Signed)
Physical Therapy Session Note  Patient Details  Name: Sharon Odom MRN: [id number] Date of Birth: 30-Jul-1970  Today's Date: 03/23/2022 PT Individual Time: 0800-0915 PT Individual Time Calculation (min): 75 min   Short Term Goals: Week 1:  PT Short Term Goal 1 (Week 1): STG=LTG due ELOS  Skilled Therapeutic Interventions/Progress Updates:    pt received in bed and agreeable to therapy. Pt reports unrated pain in L hip, premedicated. MAS performed and documented in flow sheet. Rest and positioning provided as needed. Donned ted hose tot A. Supine>sit with min A for trunk elevation. Extended time for all mobility tasks for increased independence. Squat pivot with min A to R side. Pt then performed mod A squat pivot to commode. Max A to doff brief and adjust positioning for toileting. Extended time required on commode. Continent smear noted, documented in flow sheet. Assist with pericare for thoroughness. Stand pivot transfer with RW with several attempts to stand, until therapist moved to other side, then stood CGA. Pt had difficulty maintaining upright posture in standing requiring max A to make it to chair. Pt adjusted positioning in chair with min A to lift LLE and chair functions. Pt remained in chair at end of session and was left with all needs in reach.  Therapy Documentation Precautions:  Precautions Precautions: Fall Restrictions Weight Bearing Restrictions: No General:      Therapy/Group: Individual Therapy  Mickel Fuchs 03/23/2022, 8:49 AM

## 2022-03-24 ENCOUNTER — Inpatient Hospital Stay (HOSPITAL_COMMUNITY): Payer: BLUE CROSS/BLUE SHIELD

## 2022-03-24 DIAGNOSIS — G35 Multiple sclerosis: Secondary | ICD-10-CM | POA: Diagnosis not present

## 2022-03-24 LAB — CBC WITH DIFFERENTIAL/PLATELET
Abs Immature Granulocytes: 0.04 10*3/uL (ref 0.00–0.07)
Basophils Absolute: 0 10*3/uL (ref 0.0–0.1)
Basophils Relative: 0 %
Eosinophils Absolute: 0 10*3/uL (ref 0.0–0.5)
Eosinophils Relative: 0 %
HCT: 35.8 % — ABNORMAL LOW (ref 36.0–46.0)
Hemoglobin: 11.6 g/dL — ABNORMAL LOW (ref 12.0–15.0)
Immature Granulocytes: 0 %
Lymphocytes Relative: 8 %
Lymphs Abs: 0.8 10*3/uL (ref 0.7–4.0)
MCH: 32 pg (ref 26.0–34.0)
MCHC: 32.4 g/dL (ref 30.0–36.0)
MCV: 98.9 fL (ref 80.0–100.0)
Monocytes Absolute: 1.1 10*3/uL — ABNORMAL HIGH (ref 0.1–1.0)
Monocytes Relative: 11 %
Neutro Abs: 8.1 10*3/uL — ABNORMAL HIGH (ref 1.7–7.7)
Neutrophils Relative %: 81 %
Platelets: 341 10*3/uL (ref 150–400)
RBC: 3.62 MIL/uL — ABNORMAL LOW (ref 3.87–5.11)
RDW: 12.9 % (ref 11.5–15.5)
WBC: 10.1 10*3/uL (ref 4.0–10.5)
nRBC: 0 % (ref 0.0–0.2)

## 2022-03-24 LAB — LACTIC ACID, PLASMA
Lactic Acid, Venous: 1.6 mmol/L (ref 0.5–1.9)
Lactic Acid, Venous: 1.1 mmol/L (ref 0.5–1.9)

## 2022-03-24 MED ORDER — TAPENTADOL HCL 50 MG PO TABS
50.0000 mg | ORAL_TABLET | ORAL | Status: DC | PRN
  Administered 2022-03-24 – 2022-03-27 (×6): 50 mg via ORAL
  Filled 2022-03-24 (×7): qty 1

## 2022-03-24 MED ORDER — TIZANIDINE HCL 2 MG PO TABS
4.0000 mg | ORAL_TABLET | Freq: Three times a day (TID) | ORAL | Status: DC
  Administered 2022-03-24 – 2022-03-27 (×10): 4 mg via ORAL
  Filled 2022-03-24 (×10): qty 2

## 2022-03-24 MED ORDER — SODIUM CHLORIDE 0.9 % IV SOLN
2.0000 g | INTRAVENOUS | Status: DC
  Administered 2022-03-24: 2 g via INTRAVENOUS
  Filled 2022-03-24: qty 20

## 2022-03-24 NOTE — Progress Notes (Signed)
Occupational Therapy Session Note  Patient Details  Name: Sharon Odom MRN: [id number] Date of Birth: 05-08-1971  Today's Date: 03/24/2022 OT Individual Time: 1301-1340 OT Individual Time Calculation (min): 39 min    Short Term Goals: Week 1:  OT Short Term Goal 1 (Week 1): STG=LTG d/t ELOS  Skilled Therapeutic Interventions/Progress Updates:    Chart reviewed prior to session with OT checking on pt. Upon arrival, pt reporting discomfort in bed. OT assisted with positioning, including hip placement, elevation of BLEs, and positioning more at head of bed. Pt verbalizing wanting to try to eat as she hasn't been hungry due to pain. Pt took 2 bites of food and 2 sips of liquid prior to declining further eating. Pt continues to be very lethargic, therefore session ended. Pt left supine in bed with HOB elevated and significant other present.   Therapy Documentation Precautions:  Precautions Precautions: Fall Restrictions Weight Bearing Restrictions: No General:   Vital Signs: Therapy Vitals Temp: 98.8 F (37.1 C) Temp Source: Oral Pulse Rate: 72 Resp: 14 BP: (!) 85/49 Patient Position (if appropriate): Lying Oxygen Therapy SpO2: 95 % O2 Device: Room Air Pain:   ADL: ADL Grooming: Supervision/safety (to wash face only) Where Assessed-Grooming: Other (comment) (shower) Upper Body Bathing: Contact guard Where Assessed-Upper Body Bathing: Shower Lower Body Bathing: Minimal assistance (to wash lower legs) Where Assessed-Lower Body Bathing: Shower (seated on BSC) Upper Body Dressing: Moderate assistance Where Assessed-Upper Body Dressing: Other (Comment) (BSC in shower) Lower Body Dressing: Contact guard (to doff socks and brief) Where Assessed-Lower Body Dressing: Wheelchair Toileting: Minimal assistance Where Assessed-Toileting: Other (Comment) (shower) Toilet Transfer: Minimal assistance Toilet Transfer Method: Engineer, water: Bedside commode, Other  (comment) (PWC) Walk-In Shower Transfer: Environmental education officer Method: Radiographer, therapeutic: Other (comment), Grab bars (BSC) Vision   Perception    Praxis   Balance   Exercises:   Other Treatments:     Therapy/Group: Individual Therapy  Duayne Cal 03/24/2022, 1:37 PM

## 2022-03-24 NOTE — Progress Notes (Signed)
Pt was started on empiric nitrofurantoin for UTI after fever. Suspected with possible PNA/UTI. Not true allergy to amoxicillin (N/V). Change nitrofurantoin to ceftriaxone 2g IV q24 after discussion with Dr. Letta Pate.  Onnie Boer, PharmD, BCIDP, AAHIVP, CPP Infectious Disease Pharmacist 03/24/2022 5:30 PM

## 2022-03-24 NOTE — Progress Notes (Signed)
PROGRESS NOTE   Subjective/Complaints:  Tmax 101.2 this am, US showed + nitrites, placed on empiric Macrodantin pending culture. Pt c/o lower ext pain increased over the last couple days no falls   ROS:   Pt denies SOB, abd pain, CP, N/V/C/D, and vision changes    Except for HPI  Objective:   DG Abd 1 View  Result Date: 03/22/2022 CLINICAL DATA:  Abdominal pain and distention EXAM: ABDOMEN - 1 VIEW COMPARISON:  11/06/2021 FINDINGS: Bowel gas pattern is nonspecific. Large amount of stool is seen in colon. There is no dilation of small-bowel loops. There is an electronic device overlying the right iliac bone which was also seen in previous study. No abnormal masses or calcifications are seen. Kidneys are mostly obscured by bowel contents limiting evaluation for small renal stones. Possible rectal tube is seen. IMPRESSION: Nonspecific bowel gas pattern. Large amount of stool is seen in colon. Electronically Signed   By: Ernie Avena M.D.   On: 03/22/2022 15:07   No results for input(s): "WBC", "HGB", "HCT", "PLT" in the last 72 hours.  Recent Labs    03/23/22 0553  NA 140  K 3.9  CL 102  CO2 31  GLUCOSE 91  BUN 12  CREATININE 0.87  CALCIUM 9.5      No intake or output data in the 24 hours ending 03/24/22 0733       Physical Exam: Vital Signs Blood pressure 98/72, pulse 97, temperature (!) 101.2 F (38.4 C), temperature source Oral, resp. rate 18, height 5\' 9"  (1.753 m), weight 60.4 kg, SpO2 97 %.   General: No acute distress Mood and affect are appropriate Heart: Regular rate and rhythm no rubs murmurs or extra sounds Lungs: Clear to auscultation, breathing unlabored, no rales or wheezes Abdomen: Positive bowel sounds, soft nontender to palpation, mildly distended Extremities: No clubbing, cyanosis, or edema Skin: No evidence of breakdown, no evidence of rash   Sensory exam normal sensation to  light touch and proprioception in bilateral upper and lower extremities Cerebellar exam normal finger to nose to finger as well as heel to shin in bilateral upper and lower extremities Musculoskeletal: Full range of motion in all 4 extremities. No joint swelling   Few beats clonus on LLE but 5-7 beats RLE Psych: interactive; slightly flat affect; slightly depressed       Assessment/Plan: 1. Functional deficits which require 3+ hours per day of interdisciplinary therapy in a comprehensive inpatient rehab setting. Physiatrist is providing close team supervision and 24 hour management of active medical problems listed below. Physiatrist and rehab team continue to assess barriers to discharge/monitor patient progress toward functional and medical goals  Care Tool:  Bathing  Bathing activity did not occur: Refused Body parts bathed by patient: Left arm, Chest, Abdomen, Front perineal area, Buttocks, Right upper leg, Left upper leg, Face, Right lower leg, Left lower leg, Right arm   Body parts bathed by helper: Right lower leg, Left lower leg     Bathing assist Assist Level: Contact Guard/Touching assist     Upper Body Dressing/Undressing Upper body dressing   What is the patient wearing?: Pull over shirt, Bra    Upper  body assist Assist Level: Contact Guard/Touching assist    Lower Body Dressing/Undressing Lower body dressing      What is the patient wearing?: Pants, Underwear/pull up     Lower body assist Assist for lower body dressing: Minimal Assistance - Patient > 75%     Toileting Toileting    Toileting assist Assist for toileting: Minimal Assistance - Patient > 75%     Transfers Chair/bed transfer  Transfers assist  Chair/bed transfer activity did not occur: Safety/medical concerns  Chair/bed transfer assist level: Minimal Assistance - Patient > 75%     Locomotion Ambulation   Ambulation assist   Ambulation activity did not occur: Safety/medical  concerns (unable to perform due to tone, strength, activity tolerance, and coordination deficits)          Walk 10 feet activity   Assist  Walk 10 feet activity did not occur: Safety/medical concerns (unable to perform due to tone, strength, activity tolerance, and coordination deficits)        Walk 50 feet activity   Assist Walk 50 feet with 2 turns activity did not occur: Safety/medical concerns (unable to perform due to tone, strength, activity tolerance, and coordination deficits)         Walk 150 feet activity   Assist Walk 150 feet activity did not occur: Safety/medical concerns (unable to perform due to tone, strength, activity tolerance, and coordination deficits)         Walk 10 feet on uneven surface  activity   Assist Walk 10 feet on uneven surfaces activity did not occur: Safety/medical concerns (unable to perform due to tone, strength, activity tolerance, and coordination deficits)         Wheelchair     Assist Is the patient using a wheelchair?: Yes Type of Wheelchair: Power    Wheelchair assist level: Independent Max wheelchair distance: 300 ft    Wheelchair 50 feet with 2 turns activity    Assist    Wheelchair 50 feet with 2 turns activity did not occur:  (unable to perform due to tone, strength, activity tolerance, and coordination deficits)   Assist Level: Independent   Wheelchair 150 feet activity     Assist  Wheelchair 150 feet activity did not occur:  (unable to perform due to tone, strength, activity tolerance, and coordination deficits)   Assist Level: Independent   Blood pressure 98/72, pulse 97, temperature (!) 101.2 F (38.4 C), temperature source Oral, resp. rate 18, height 5\' 9"  (1.753 m), weight 60.4 kg, SpO2 97 %.  Medical Problem List and Plan: 1. Functional deficits secondary to secondary progressive multiple sclerosis with spasticity and uncontrolled pain- and need to titration up of ITB pump - pt's  level of function has been declining for last few months- and now is max-total A             -patient may  shower             -ELOS/Goals: min Assist 14-18 days  D/c date 10/29- Sunday per pt request  Con't CIR- PT, OT - done with SLP  Dont think pt will be ready for d/c in am due to fever today and awaiting cultures, may need to broaden abx , or start IV (if WBCs elevated ) 2.  Antithrombotics: -DVT/anticoagulation:  Pharmaceutical: Lovenox             -antiplatelet therapy: none 3. Pain/spasticity management:  -baclofen IT pump- will titrate while here             -  baclofen 40 mg-  -oxycodone 15 mg - will increase to q4 hours prn for therapy.  -dantrolene 50 mg -tizanidine 4 mg 10/18- will add Baclofen 20 mg daily prn for during day when spasticity worse; also d/c ibuprofen and add Aleve 500 mg BID- at 6am/6pm 10/23- added methadone 2.5 mg BID on 10/21, increase to 5mg  today , pt has not taken prn oxy IR 15mg  will reduce to 10mg  given higher dose of methadone 10/25- pain doing better- con't methadone and Oxy at lower dose.   10/26- more pain this AM, but once got pain meds, was feeling a little better- -con't regimen- don't feel comfortable increasing methadone for 1 month- send home on Oxycodone 10 mg pills 10/27- pt said having more sleepiness- said can decide over weekend if goes back to just oxy or continue methadone/Oxy dosing.  10/28- more drowsy on combination of methadone and oxy will increase oxy to 15mg  and d/c methadone , consider nucynta 4. Mood/Behavior/Sleep: LCSW to evaluate and provide emotional support             -continue Wellbutrin XL 300 mg daily             -continue Lamictal 25 mg 2 tabs BID             -antipsychotic agents: n/a 5. Neuropsych/cognition: This patient is capable of making decisions on her own behalf. 6. Skin/Wound Care: Routine skin care checks 7. Fluids/Electrolytes/Nutrition: Routine Is and Os and follow-up chemistries             -continue  vitamin supplements (C, D3, B12) 8: GERD: continue Zantac  10/24- add Pepcid and continue Protonix.  9: Neurogenic bladder: continue Foley catheter- last changed 03/01/22- will need to be changed prior to d/c.   10/25- d/w nursing to get it changed Saturday 10: Bowel program: continue psyllium, Miralax, Senekot  101/9- increased senna to 3 tabs daily and added miralax daily.  10/21- no BM since admission- will give Sorbitol and SSE if needed after therapy today  10/22- at least 2 mushy stools last night-  10/24- is oozing stool, so doesn't want to take bowel meds for a few days- I think she doesn't have much control, but worse when stool loose.   10/25- will reduce Senna to 3 tabs/day since still oozing stool- think she doesn't have enough  control of bowels.  also OT explained that she was having a lot of stool without control in shower today- hopefully with that and possible another dose of Sorbitol after therapy today, she can get cleaned out.   10/27- No results with sorbitol or SSE- will try Lactulose 30G and see if results- if none, will need more intervention 11: Edema: continue Lasix 40 mg daily- also OT explained that she was having a lot of stool without control in shower today- hopefully with that and possible another dose of Sorbitol after therapy today, she can get cleaned out.  12: Season allergies: continue Afrin PRN, Singulair daily 13: Post-menopausal: continue Premarin vaginal cream  14. Dysphagia/resp issues- will get CXR and Slp eval for swallowing issues- had been on Nectar thick liquids prior.   10/20- MBS today  10/21- can stay on thin liquids with effortful swallows and secondary swallow-   10/24- on regular diet with secondary swallow  15. Tooth abscess- con't Flagyl- finish Friday 250 mg TID.  10/24- done  16. Hypokalemia  10/24- K+ 3.4 yesterday- will increase Kcl to 20 mEq daily.   10/26- will recheck  in AM  10/27- K 3.9 17. ITB pump/Spasticity  10/19- will  increase ITB pump today- increased from 386 mcg/day to 425 mcg/day- depending on her results, will see when to titrate her up next?  10/24- will increase ITB pump today- went up 10.1% to 468.5 mcg/day- new fill date 05/09/22   10/26- increased to 491.4 mcg/day- new refill date 05/07/22- increased 5%  10/27- likely making her constipation much worse 18 Has foley- but more sleepy  10/27- will change foley then check U/A and Cx to make sure not having a UTI which would make dysarthria/sleepiness worse. Since had bowel incontinence, is possible.     LOS: 11 days A FACE TO FACE EVALUATION WAS PERFORMED  Erick Colace 03/24/2022, 7:33 AM

## 2022-03-24 NOTE — Progress Notes (Signed)
   03/24/22 0600  Assess: MEWS Score  Temp (!) 101.2 F (38.4 C)  BP 98/72  MAP (mmHg) 79  Pulse Rate 97  Resp 18  SpO2 97 %  O2 Device Room Air  Assess: MEWS Score  MEWS Temp 1  MEWS Systolic 1  MEWS Pulse 0  MEWS RR 0  MEWS LOC 0  MEWS Score 2  MEWS Score Color Yellow  Assess: if the MEWS score is Yellow or Red  Were vital signs taken at a resting state? Yes  Focused Assessment No change from prior assessment  Does the patient meet 2 or more of the SIRS criteria? No  MEWS guidelines implemented *See Row Information* Yes  Take Vital Signs  Increase Vital Sign Frequency  Yellow: Q 2hr X 2 then Q 4hr X 2, if remains yellow, continue Q 4hrs  Escalate  MEWS: Escalate Yellow: discuss with charge nurse/RN and consider discussing with provider and RRT  Notify: Charge Nurse/RN  Name of Charge Nurse/RN Notified Jaime/Jennifer  Date Charge Nurse/RN Notified 03/24/22  Time Charge Nurse/RN Notified 0630  Notify: Provider  Provider Name/Title KIrstein  Date Provider Notified 03/24/22  Time Provider Notified [phone]  Method of Notification Call  Notification Reason Other (Comment) (yellow mews)  Provider response See new orders  Date of Provider Response 03/24/22  Time of Provider Response [phone]  Assess: SIRS CRITERIA  SIRS Temperature  1  SIRS Pulse 1  SIRS Respirations  0  SIRS WBC 1  SIRS Score Sum  3

## 2022-03-24 NOTE — Progress Notes (Signed)
Physical Therapy Session Note  Patient Details  Name: Sharon Odom MRN: [id number] Date of Birth: 1971/04/15  Today's Date: 03/24/2022 PT Individual Time: 0805-0820 PT Individual Time Calculation (min): 15 min   Short Term Goals: Week 1:  PT Short Term Goal 1 (Week 1): STG=LTG due ELOS  Skilled Therapeutic Interventions/Progress Updates:    Chart reviewed. Pt noted to have spiked fever overnight. PT checked with RN prior to session, and found pt had continued high fever through the night, low BP, lethargy, and was diaphoretic.  Pt was received upon return from chest X-Ray by PT and RN. Pt reported generalized soreness, but did not quantify and was resolved with repositioning. Pt had temperature of 98.43F, with not of recently received medication, and BP of 75/31mmHg in LUE and 90/66mmHg in RUE. Pt noted to still be lethargic and diaphoretic. RN and PT agreed pt not appropriate for activity at this time. PT assisted pt in brief requested self-care.  PT and pt then discussed activity, and pt in agreement to continue therapy once symptoms resolved. At end of session, pt was left semi-reclined in bed with alarm engaged, nurse call bell and all needs in reach.     Therapy Documentation Precautions:  Precautions Precautions: Fall Restrictions Weight Bearing Restrictions: No General: PT Amount of Missed Time (min): 30 Minutes PT Missed Treatment Reason: Patient ill (Comment) ((recent) high fever, low BP (90/53), lethargic, diaphoretic - pt under evaluation)     Therapy/Group: Individual Therapy  Marquette Old, PT, DPT 03/24/2022, 8:34 AM

## 2022-03-25 DIAGNOSIS — G35 Multiple sclerosis: Secondary | ICD-10-CM | POA: Diagnosis not present

## 2022-03-25 LAB — URINE CULTURE: Culture: >=100000 — AB

## 2022-03-25 LAB — GLUCOSE, CAPILLARY: Glucose-Capillary: 88 mg/dL (ref 70–99)

## 2022-03-25 MED ORDER — SODIUM CHLORIDE 0.9 % IV SOLN: INTRAVENOUS | Status: DC | PRN

## 2022-03-25 MED ORDER — SODIUM CHLORIDE 0.9 % IV SOLN
2.0000 g | Freq: Three times a day (TID) | INTRAVENOUS | Status: DC
  Administered 2022-03-25 – 2022-03-26 (×3): 2 g via INTRAVENOUS
  Filled 2022-03-25 (×4): qty 12.5

## 2022-03-25 NOTE — Progress Notes (Signed)
Pt is complaining of more frequent episodes of confusion. She stated that she called her husband at 1:30am with confusion thinking that she was at home in the garage. Pt is concerned about the medications that she is taking and wonders if they are making her confused. Pt is also complaining of worsening dry mouth. No further concerns at this time, call bell in reach.

## 2022-03-25 NOTE — Plan of Care (Signed)
  Problem: Consults Goal: Skin Care Protocol Initiated - if Braden Score 18 or less Description: If consults are not indicated, leave blank or document N/A Outcome: Progressing Goal: Nutrition Consult-if indicated Outcome: Progressing   Problem: RH BOWEL ELIMINATION Goal: RH STG MANAGE BOWEL WITH ASSISTANCE Description: STG Manage Bowel with min Assistance. Outcome: Progressing Goal: RH STG MANAGE BOWEL W/MEDICATION W/ASSISTANCE Description: STG Manage Bowel with Medication with min Assistance. Outcome: Progressing   Problem: RH SKIN INTEGRITY Goal: RH STG SKIN FREE OF INFECTION/BREAKDOWN Description: Skin will be free of infection/breakdown with min assist Outcome: Progressing   Problem: RH SAFETY Goal: RH STG ADHERE TO SAFETY PRECAUTIONS W/ASSISTANCE/DEVICE Description: STG Adhere to Safety Precautions With min Assistance/Device. Outcome: Progressing   Problem: RH PAIN MANAGEMENT Goal: RH STG PAIN MANAGED AT OR BELOW PT'S PAIN GOAL Description: Pain will be managed at 3 out of 10 on pain scale independently Outcome: Progressing   Problem: RH KNOWLEDGE DEFICIT GENERAL Goal: RH STG INCREASE KNOWLEDGE OF SELF CARE AFTER HOSPITALIZATION Description: Patient will be able to verbalize any adverse reactions related to adjustments to the baclofen pump Outcome: Progressing

## 2022-03-25 NOTE — Progress Notes (Signed)
PROGRESS NOTE   Subjective/Complaints:  Afebrile overnite, appreciate pharmacy assist now on ceftriaxone to cover UTI and possible PNA.   Discussed med changes with pt, to treat infection and minimize sedation No spasms per pt, just weakness   ROS:   Pt denies SOB, abd pain, CP, N/V/C/D, and vision changes    Except for HPI  Objective:   DG Chest 2 View  Result Date: 03/24/2022 CLINICAL DATA:  Fever today. EXAM: CHEST - 2 VIEW COMPARISON:  Radiograph 03/13/2022 FINDINGS: Lower lung volumes from prior exam. Mild patchy bibasilar opacities. The heart is normal in size. Normal mediastinal contours allowing for rotation. No large pleural effusion. No pneumothorax. Remote right posterior rib fractures. IMPRESSION: Mild patchy bibasilar opacities, may be atelectasis or pneumonia in the setting of fever. Electronically Signed   By: Keith Rake M.D.   On: 03/24/2022 09:45   Recent Labs    03/24/22 1119  WBC 10.1  HGB 11.6*  HCT 35.8*  PLT 341    Recent Labs    03/23/22 0553  NA 140  K 3.9  CL 102  CO2 31  GLUCOSE 91  BUN 12  CREATININE 0.87  CALCIUM 9.5       Intake/Output Summary (Last 24 hours) at 03/25/2022 7989 Last data filed at 03/24/2022 2200 Gross per 24 hour  Intake 240 ml  Output 652 ml  Net -412 ml         Physical Exam: Vital Signs Blood pressure 92/64, pulse 79, temperature 98.7 F (37.1 C), temperature source Oral, resp. rate 20, height 5\' 9"  (1.753 m), weight 60.4 kg, SpO2 94 %.   General: No acute distress Mood and affect are appropriate Heart: Regular rate and rhythm no rubs murmurs or extra sounds Lungs: Clear to auscultation, breathing unlabored, no rales or wheezes Abdomen: Positive bowel sounds, soft nontender to palpation, mildly distended Extremities: No clubbing, cyanosis, or edema Skin: No evidence of breakdown, no evidence of rash   Sensory exam normal sensation  to light touch and proprioception in bilateral upper and lower extremities Cerebellar exam normal finger to nose to finger as well as heel to shin in bilateral upper and lower extremities Musculoskeletal: Full range of motion in all 4 extremities. No joint swelling  Right upper ext 4/5 Left upper ext 2/5 Right lower 2-/5, left lower trace No evidence of clus at ankles MAS 1 R quad, MAS 0 left quad  Psych: interactive; slightly flat affect; slightly depressed       Assessment/Plan: 1. Functional deficits which require 3+ hours per day of interdisciplinary therapy in a comprehensive inpatient rehab setting. Physiatrist is providing close team supervision and 24 hour management of active medical problems listed below. Physiatrist and rehab team continue to assess barriers to discharge/monitor patient progress toward functional and medical goals  Care Tool:  Bathing  Bathing activity did not occur: Refused Body parts bathed by patient: Left arm, Chest, Abdomen, Front perineal area, Buttocks, Right upper leg, Left upper leg, Face, Right lower leg, Left lower leg, Right arm   Body parts bathed by helper: Right lower leg, Left lower leg     Bathing assist Assist Level: Contact Guard/Touching assist  Upper Body Dressing/Undressing Upper body dressing   What is the patient wearing?: Pull over shirt, Bra    Upper body assist Assist Level: Contact Guard/Touching assist    Lower Body Dressing/Undressing Lower body dressing      What is the patient wearing?: Pants, Underwear/pull up     Lower body assist Assist for lower body dressing: Minimal Assistance - Patient > 75%     Toileting Toileting    Toileting assist Assist for toileting: Minimal Assistance - Patient > 75%     Transfers Chair/bed transfer  Transfers assist  Chair/bed transfer activity did not occur: Safety/medical concerns  Chair/bed transfer assist level: Minimal Assistance - Patient > 75%      Locomotion Ambulation   Ambulation assist   Ambulation activity did not occur: Safety/medical concerns (unable to perform due to tone, strength, activity tolerance, and coordination deficits)          Walk 10 feet activity   Assist  Walk 10 feet activity did not occur: Safety/medical concerns (unable to perform due to tone, strength, activity tolerance, and coordination deficits)        Walk 50 feet activity   Assist Walk 50 feet with 2 turns activity did not occur: Safety/medical concerns (unable to perform due to tone, strength, activity tolerance, and coordination deficits)         Walk 150 feet activity   Assist Walk 150 feet activity did not occur: Safety/medical concerns (unable to perform due to tone, strength, activity tolerance, and coordination deficits)         Walk 10 feet on uneven surface  activity   Assist Walk 10 feet on uneven surfaces activity did not occur: Safety/medical concerns (unable to perform due to tone, strength, activity tolerance, and coordination deficits)         Wheelchair     Assist Is the patient using a wheelchair?: Yes Type of Wheelchair: Power    Wheelchair assist level: Independent Max wheelchair distance: 300 ft    Wheelchair 50 feet with 2 turns activity    Assist    Wheelchair 50 feet with 2 turns activity did not occur:  (unable to perform due to tone, strength, activity tolerance, and coordination deficits)   Assist Level: Independent   Wheelchair 150 feet activity     Assist  Wheelchair 150 feet activity did not occur:  (unable to perform due to tone, strength, activity tolerance, and coordination deficits)   Assist Level: Independent   Blood pressure 92/64, pulse 79, temperature 98.7 F (37.1 C), temperature source Oral, resp. rate 20, height 5\' 9"  (1.753 m), weight 60.4 kg, SpO2 94 %.  Medical Problem List and Plan: 1. Functional deficits secondary to secondary progressive multiple  sclerosis with spasticity and uncontrolled pain- and need to titration up of ITB pump - pt's level of function has been declining for last few months- and now is max-total A             -patient may  shower             -ELOS/Goals: min Assist 14-18 days    Con't CIR- PT, OT - done with SLP  Not ready for d/c today due to fever 10/28, UCx >100K Kleb, await sensitivities 2.  Antithrombotics: -DVT/anticoagulation:  Pharmaceutical: Lovenox             -antiplatelet therapy: none 3. Pain/spasticity management:  -baclofen IT pump- will titrate while here             -  baclofen 40 mg-  -oxycodone 15 mg - will increase to q4 hours prn for therapy.  -dantrolene 50 mg -tizanidine 4 mg 10/18- will add Baclofen 20 mg daily prn for during day when spasticity worse; also d/c ibuprofen and add Aleve 500 mg BID- at 6am/6pm 10/23- added methadone 2.5 mg BID on 10/21, increase to 5mg  today , pt has not taken prn oxy IR 15mg  will reduce to 10mg  given higher dose of methadone 10/25- pain doing better- con't methadone and Oxy at lower dose.   10/26- more pain this AM, but once got pain meds, was feeling a little better- -con't regimen- don't feel comfortable increasing methadone for 1 month- send home on Oxycodone 10 mg pills 10/27- pt said having more sleepiness- said can decide over weekend if goes back to just oxy or continue methadone/Oxy dosing.  10/28- more drowsy on combination of methadone and oxy on trial of nucynta 50mg  Q6 prn- only took 1 dose yesterday 4. Mood/Behavior/Sleep: LCSW to evaluate and provide emotional support             -continue Wellbutrin XL 300 mg daily             -continue Lamictal 25 mg 2 tabs BID             -antipsychotic agents: n/a 5. Neuropsych/cognition: This patient is capable of making decisions on her own behalf. 6. Skin/Wound Care: Routine skin care checks 7. Fluids/Electrolytes/Nutrition: Routine Is and Os and follow-up chemistries             -continue vitamin  supplements (C, D3, B12) 8: GERD: continue Zantac  10/24- add Pepcid and continue Protonix.  9: Neurogenic bladder: continue Foley catheter- last changed 03/01/22- will need to be changed prior to d/c.   10/25- d/w nursing to get it changed Saturday 10: Bowel program: continue psyllium, Miralax, Senekot  101/9- increased senna to 3 tabs daily and added miralax daily.  10/21- no BM since admission- will give Sorbitol and SSE if needed after therapy today  10/22- at least 2 mushy stools last night-  10/24- is oozing stool, so doesn't want to take bowel meds for a few days- I think she doesn't have much control, but worse when stool loose.   10/25- will reduce Senna to 3 tabs/day since still oozing stool- think she doesn't have enough  control of bowels.  also OT explained that she was having a lot of stool without control in shower today- hopefully with that and possible another dose of Sorbitol after therapy today, she can get cleaned out.   10/27- No results with sorbitol or SSE- will try Lactulose 30G and see if results- if none, will need more intervention 11: Edema: continue Lasix 40 mg daily- also OT explained that she was having a lot of stool without control in shower today- hopefully with that and possible another dose of Sorbitol after therapy today, she can get cleaned out.  12: Season allergies: continue Afrin PRN, Singulair daily 13: Post-menopausal: continue Premarin vaginal cream  14. Dysphagia/resp issues- will get CXR and Slp eval for swallowing issues- had been on Nectar thick liquids prior.   10/20- MBS today  10/21- can stay on thin liquids with effortful swallows and secondary swallow-   10/24- on regular diet with secondary swallow  15. Tooth abscess- con't Flagyl- finish Friday 250 mg TID.  10/24- done  16. Hypokalemia  10/24- K+ 3.4 yesterday- will increase Kcl to 20 mEq daily.   10/26- will  recheck in AM  10/27- K 3.9 17. ITB pump/Spasticity  10/19- will increase ITB  pump today- increased from 386 mcg/day to 425 mcg/day- depending on her results, will see when to titrate her up next?  10/24- will increase ITB pump today- went up 10.1% to 468.5 mcg/day- new fill date 05/09/22   10/26- increased to 491.4 mcg/day- new refill date 05/07/22- increased 5%  10/27- likely making her constipation much worse 18 Has foley- but more sleepy  10/27- will change foley then check U/A and Cx to make sure not having a UTI which would make dysarthria/sleepiness worse. Since had bowel incontinence, is possible.   No signs of spasticity, BPs soft and pt drowsy , reduced tizanidine to 4mg  will monitor   LOS: 12 days A FACE TO FACE EVALUATION WAS PERFORMED  03/25/2022, 6:14 AM

## 2022-03-25 NOTE — Progress Notes (Signed)
Urine culture came back with klebsiella that is pan sens except amp/nitrofurantoin and pan sensitive enterobacter. Since enterobacter has the potential for AmpC, we will change ceftriaxone to cefepime instead after discussing with Dr. Letta Pate.  Onnie Boer, PharmD, BCIDP, AAHIVP, CPP Infectious Disease Pharmacist 03/25/2022 10:07 AM

## 2022-03-26 ENCOUNTER — Other Ambulatory Visit (HOSPITAL_COMMUNITY): Payer: Self-pay

## 2022-03-26 ENCOUNTER — Inpatient Hospital Stay (HOSPITAL_COMMUNITY): Payer: BLUE CROSS/BLUE SHIELD

## 2022-03-26 DIAGNOSIS — G35 Multiple sclerosis: Secondary | ICD-10-CM | POA: Diagnosis not present

## 2022-03-26 LAB — BASIC METABOLIC PANEL
Anion gap: 12 (ref 5–15)
BUN: 19 mg/dL (ref 6–20)
CO2: 27 mmol/L (ref 22–32)
Calcium: 9.1 mg/dL (ref 8.9–10.3)
Chloride: 104 mmol/L (ref 98–111)
Creatinine, Ser: 1.2 mg/dL — ABNORMAL HIGH (ref 0.44–1.00)
GFR, Estimated: 55 mL/min — ABNORMAL LOW (ref >60)
Glucose, Bld: 91 mg/dL (ref 70–99)
Potassium: 4 mmol/L (ref 3.5–5.1)
Sodium: 143 mmol/L (ref 135–145)

## 2022-03-26 LAB — CBC WITH DIFFERENTIAL/PLATELET
Abs Immature Granulocytes: 0.08 10*3/uL — ABNORMAL HIGH (ref 0.00–0.07)
Basophils Absolute: 0 10*3/uL (ref 0.0–0.1)
Basophils Relative: 0 %
Eosinophils Absolute: 0.1 10*3/uL (ref 0.0–0.5)
Eosinophils Relative: 1 %
HCT: 31.4 % — ABNORMAL LOW (ref 36.0–46.0)
Hemoglobin: 10.2 g/dL — ABNORMAL LOW (ref 12.0–15.0)
Immature Granulocytes: 1 %
Lymphocytes Relative: 16 %
Lymphs Abs: 1.3 10*3/uL (ref 0.7–4.0)
MCH: 31.7 pg (ref 26.0–34.0)
MCHC: 32.5 g/dL (ref 30.0–36.0)
MCV: 97.5 fL (ref 80.0–100.0)
Monocytes Absolute: 0.6 10*3/uL (ref 0.1–1.0)
Monocytes Relative: 8 %
Neutro Abs: 5.9 10*3/uL (ref 1.7–7.7)
Neutrophils Relative %: 74 %
Platelets: 309 10*3/uL (ref 150–400)
RBC: 3.22 MIL/uL — ABNORMAL LOW (ref 3.87–5.11)
RDW: 12.4 % (ref 11.5–15.5)
WBC: 8 10*3/uL (ref 4.0–10.5)
nRBC: 0 % (ref 0.0–0.2)

## 2022-03-26 MED ORDER — ORAL CARE MOUTH RINSE: 15.0000 mL | OROMUCOSAL | Status: DC | PRN

## 2022-03-26 MED ORDER — SODIUM CHLORIDE 0.9 % IV SOLN
2.0000 g | Freq: Three times a day (TID) | INTRAVENOUS | Status: DC
  Filled 2022-03-26: qty 12.5

## 2022-03-26 MED ORDER — SODIUM CHLORIDE 0.9 % IV SOLN
2.0000 g | Freq: Two times a day (BID) | INTRAVENOUS | Status: DC
  Administered 2022-03-26 – 2022-03-27 (×2): 2 g via INTRAVENOUS
  Filled 2022-03-26 (×3): qty 12.5

## 2022-03-26 MED ORDER — GADOBUTROL 1 MMOL/ML IV SOLN
6.0000 mL | Freq: Once | INTRAVENOUS | Status: AC | PRN
  Administered 2022-03-26: 6 mL via INTRAVENOUS

## 2022-03-26 MED ORDER — CIPROFLOXACIN HCL 500 MG PO TABS: 500.0000 mg | ORAL_TABLET | Freq: Two times a day (BID) | ORAL | Status: DC

## 2022-03-26 NOTE — Progress Notes (Signed)
PROGRESS NOTE   Subjective/Complaints:  Pt now on cefipime- because she was so sedated and confused per husband (called husband at 1am Sat night to say was in their garage) and hasn't responded to texts for 2 days.   Pt says her vision is blurry- and c/o being in a lot of pain Says spasticity is "OK" but hurting real bad.   However very sedated.   Did CBC with diff- WBC down to 8.0 but Cr up to 1.20- spoke to pharmacy and they said we need to decrease cefepime to q12 hours- will give 1 more day and if still confused in AM/after MRI results back, then will d/c Nucynta and change back to Oxy.   ROS: Limited by sedation    Except for HPI  Objective:   No results found. Recent Labs    03/24/22 1119 03/26/22 0506  WBC 10.1 8.0  HGB 11.6* 10.2*  HCT 35.8* 31.4*  PLT 341 309    Recent Labs    03/26/22 0506  NA 143  K 4.0  CL 104  CO2 27  GLUCOSE 91  BUN 19  CREATININE 1.20*  CALCIUM 9.1      Intake/Output Summary (Last 24 hours) at 03/26/2022 1910 Last data filed at 03/26/2022 1356 Gross per 24 hour  Intake 568.74 ml  Output 925 ml  Net -356.26 ml        Physical Exam: Vital Signs Blood pressure 116/86, pulse 76, temperature 98.3 F (36.8 C), temperature source Oral, resp. rate 16, height 5\' 9"  (1.753 m), weight 60.4 kg, SpO2 94 %.    General: awake, but very sleepy; hard to understand more than normal- head almost lolling; c/o LUE "heavy", NAD HENT: conjugate gaze; oropharynx moist CV: regular rate; no JVD Pulmonary: CTA B/L; no W/R/R- good air movement GI: soft, NT, ND,  (+)BS; ITB pump in place Psychiatric: a little confused; very sleepy Neurological: very sedated- worsening dysphonia-   Sensory exam normal sensation to light touch and proprioception in bilateral upper and lower extremities Cerebellar exam normal finger to nose to finger as well as heel to shin in bilateral upper and lower  extremities Musculoskeletal: Full range of motion in all 4 extremities. No joint swelling  Right upper ext 4/5 Left upper ext 2/5 usually, but c/o heaviness- still ~ 2- to 2/5, but took a very long time to respond to commands, not clear if cognitive or strength Right lower 2-/5, left lower trace No evidence of clus at ankles MAS 1 R quad, MAS 0 left quad  Psych: interactive; slightly flat affect; slightly depressed       Assessment/Plan: 1. Functional deficits which require 3+ hours per day of interdisciplinary therapy in a comprehensive inpatient rehab setting. Physiatrist is providing close team supervision and 24 hour management of active medical problems listed below. Physiatrist and rehab team continue to assess barriers to discharge/monitor patient progress toward functional and medical goals  Care Tool:  Bathing  Bathing activity did not occur: Refused Body parts bathed by patient: Left arm, Chest, Abdomen, Front perineal area, Buttocks, Right upper leg, Left upper leg, Face, Right lower leg, Left lower leg, Right arm   Body parts  bathed by helper: Right lower leg, Left lower leg     Bathing assist Assist Level: Contact Guard/Touching assist     Upper Body Dressing/Undressing Upper body dressing   What is the patient wearing?: Pull over shirt, Bra    Upper body assist Assist Level: Contact Guard/Touching assist    Lower Body Dressing/Undressing Lower body dressing      What is the patient wearing?: Pants, Underwear/pull up     Lower body assist Assist for lower body dressing: Minimal Assistance - Patient > 75%     Toileting Toileting    Toileting assist Assist for toileting: Minimal Assistance - Patient > 75%     Transfers Chair/bed transfer  Transfers assist  Chair/bed transfer activity did not occur: Safety/medical concerns  Chair/bed transfer assist level: Minimal Assistance - Patient > 75%     Locomotion Ambulation   Ambulation assist    Ambulation activity did not occur: Safety/medical concerns (unable to perform due to tone, strength, activity tolerance, and coordination deficits)          Walk 10 feet activity   Assist  Walk 10 feet activity did not occur: Safety/medical concerns (unable to perform due to tone, strength, activity tolerance, and coordination deficits)        Walk 50 feet activity   Assist Walk 50 feet with 2 turns activity did not occur: Safety/medical concerns (unable to perform due to tone, strength, activity tolerance, and coordination deficits)         Walk 150 feet activity   Assist Walk 150 feet activity did not occur: Safety/medical concerns (unable to perform due to tone, strength, activity tolerance, and coordination deficits)         Walk 10 feet on uneven surface  activity   Assist Walk 10 feet on uneven surfaces activity did not occur: Safety/medical concerns (unable to perform due to tone, strength, activity tolerance, and coordination deficits)         Wheelchair     Assist Is the patient using a wheelchair?: Yes Type of Wheelchair: Power    Wheelchair assist level: Independent Max wheelchair distance: 300 ft    Wheelchair 50 feet with 2 turns activity    Assist    Wheelchair 50 feet with 2 turns activity did not occur:  (unable to perform due to tone, strength, activity tolerance, and coordination deficits)   Assist Level: Independent   Wheelchair 150 feet activity     Assist  Wheelchair 150 feet activity did not occur:  (unable to perform due to tone, strength, activity tolerance, and coordination deficits)   Assist Level: Independent   Blood pressure 116/86, pulse 76, temperature 98.3 F (36.8 C), temperature source Oral, resp. rate 16, height 5\' 9"  (1.753 m), weight 60.4 kg, SpO2 94 %.  Medical Problem List and Plan: 1. Functional deficits secondary to secondary progressive multiple sclerosis with spasticity and uncontrolled pain-  and need to titration up of ITB pump - pt's level of function has been declining for last few months- and now is max-total A             -patient may  shower             -ELOS/Goals: min Assist 14-18 days    Con't CIR- PT, OT - done with SLP  Not ready for d/c today due to fever 10/28, UCx >100K Kleb, await sensitivities  10/30- Has Klebsiella and Enterobacter- 100k- keep on Cefepime due to confusion- will wait for now to  change to Cipro- as long as cognition better.    2.  Antithrombotics: -DVT/anticoagulation:  Pharmaceutical: Lovenox             -antiplatelet therapy: none 3. Pain/spasticity management:  -baclofen IT pump- will titrate while here             -baclofen 40 mg-  -oxycodone 15 mg - will increase to q4 hours prn for therapy.  -dantrolene 50 mg -tizanidine 4 mg 10/18- will add Baclofen 20 mg daily prn for during day when spasticity worse; also d/c ibuprofen and add Aleve 500 mg BID- at 6am/6pm 10/23- added methadone 2.5 mg BID on 10/21, increase to 5mg  today , pt has not taken prn oxy IR 15mg  will reduce to 10mg  given higher dose of methadone 10/25- pain doing better- con't methadone and Oxy at lower dose.   10/26- more pain this AM, but once got pain meds, was feeling a little better- -con't regimen- don't feel comfortable increasing methadone for 1 month- send home on Oxycodone 10 mg pills 10/27- pt said having more sleepiness- said can decide over weekend if goes back to just oxy or continue methadone/Oxy dosing.  10/28- more drowsy on combination of methadone and oxy on trial of nucynta 50mg  Q6 prn- only took 1 dose yesterday 10/30- will d/c Nucynta tomorrow if MRI of brain (-) since that could be cause of increased sedation.   4. Mood/Behavior/Sleep: LCSW to evaluate and provide emotional support             -continue Wellbutrin XL 300 mg daily             -continue Lamictal 25 mg 2 tabs BID             -antipsychotic agents: n/a 5. Neuropsych/cognition: This patient  is capable of making decisions on her own behalf. 6. Skin/Wound Care: Routine skin care checks 7. Fluids/Electrolytes/Nutrition: Routine Is and Os and follow-up chemistries             -continue vitamin supplements (C, D3, B12) 8: GERD: continue Zantac  10/24- add Pepcid and continue Protonix.  9: Neurogenic bladder: continue Foley catheter- last changed 03/01/22- will need to be changed prior to d/c.   10/25- d/w nursing to get it changed Saturday 10: Bowel program: continue psyllium, Miralax, Senekot  101/9- increased senna to 3 tabs daily and added miralax daily.  10/21- no BM since admission- will give Sorbitol and SSE if needed after therapy today  10/22- at least 2 mushy stools last night-  10/24- is oozing stool, so doesn't want to take bowel meds for a few days- I think she doesn't have much control, but worse when stool loose.   10/25- will reduce Senna to 3 tabs/day since still oozing stool- think she doesn't have enough  control of bowels.  also OT explained that she was having a lot of stool without control in shower today- hopefully with that and possible another dose of Sorbitol after therapy today, she can get cleaned out.   10/27- No results with sorbitol or SSE- will try Lactulose 30G and see if results- if none, will need more intervention 11: Edema: continue Lasix 40 mg daily- also OT explained that she was having a lot of stool without control in shower today- hopefully with that and possible another dose of Sorbitol after therapy today, she can get cleaned out.  12: Season allergies: continue Afrin PRN, Singulair daily 13: Post-menopausal: continue Premarin vaginal cream  14. Dysphagia/resp issues-  will get CXR and Slp eval for swallowing issues- had been on Nectar thick liquids prior.   10/20- MBS today  10/21- can stay on thin liquids with effortful swallows and secondary swallow-   10/24- on regular diet with secondary swallow  15. Tooth abscess- con't Flagyl- finish  Friday 250 mg TID.  10/24- done  16. Hypokalemia  10/24- K+ 3.4 yesterday- will increase Kcl to 20 mEq daily.   10/26- will recheck in AM  10/27- K 3.9 17. ITB pump/Spasticity  10/19- will increase ITB pump today- increased from 386 mcg/day to 425 mcg/day- depending on her results, will see when to titrate her up next?  10/24- will increase ITB pump today- went up 10.1% to 468.5 mcg/day- new fill date 05/09/22   10/26- increased to 491.4 mcg/day- new refill date 05/07/22- increased 5%  10/27- likely making her constipation much worse 18 Has foley- but more sleepy  10/27- will change foley then check U/A and Cx to make sure not having a UTI which would make dysarthria/sleepiness worse. Since had bowel incontinence, is possible.   10/30- has UTI- >100k- as above 19. LUE weakness?-  10/30- will get MRI- of brain to assess- still not back quite yet.    I spent a total of 55   minutes on total care today- >50% coordination of care- due to d/w pharmacy, team, as well as husband-for prolonged period.     No signs of spasticity, BPs soft and pt drowsy , reduced tizanidine to 4mg  will monitor   LOS: 13 days A FACE TO FACE EVALUATION WAS PERFORMED  Mikaylah Libbey 03/26/2022, 7:10 PM

## 2022-03-26 NOTE — Progress Notes (Incomplete)
Inpatient Rehabilitation {Admission / Discharge:26440} Medication Review by a Pharmacist  A complete drug regimen review was completed for this patient to identify any potential clinically significant medication issues.  High Risk Drug Classes Is patient taking? Indication by Medication  Antipsychotic {Receiving?:26196}   Anticoagulant {Receiving?:26196}   Antibiotic {Receiving?:26196}   Opioid {Receiving?:26196} Methadone ***  Antiplatelet {Receiving?:26196}   Hypoglycemics/insulin {Yes or No?:26198}   Vasoactive Medication {Receiving?:26196}   Chemotherapy {Receiving Chemo?:26197}   Other {Yes or No?:26198} Baclofen pump - spasms/MS Dalfampridine - MS Tizanidine - spasms Montelukast - allergy (resumed IP)  Methocarbamol/diazepam - spasms Miralax/Senokot-S - constipation Bupropion - depression     Type of Medication Issue Identified Description of Issue Recommendation(s)  Drug Interaction(s) (clinically significant)     Duplicate Therapy     Allergy     No Medication Administration End Date     Incorrect Dose     Additional Drug Therapy Needed     Significant med changes from prior encounter (inform family/care partners about these prior to discharge).  Communicate medication changes with patient/family at discharge  Other       Clinically significant medication issues were identified that warrant physician communication and completion of prescribed/recommended actions by midnight of the next day:  {Yes or No?:26198}  Name of provider notified for urgent issues identified: ***   Provider Method of Notification: ***    Pharmacist comments: ***    Time spent performing this drug regimen review (minutes): 20   Thank you for allowing pharmacy to be a part of this patient's care.  Ardyth Harps, PharmD Clinical Pharmacist

## 2022-03-26 NOTE — TOC Transition Note (Signed)
Meds no longer stored in MAIN pharmacy. Contact TOC pharmacy for discharge meds. 

## 2022-03-27 ENCOUNTER — Inpatient Hospital Stay (HOSPITAL_COMMUNITY)
Admission: RE | Admit: 2022-03-27 | Discharge: 2022-04-03 | DRG: 698 | Disposition: A | Discharge status: Rehabilitation Facility | Payer: BLUE CROSS/BLUE SHIELD | Source: Other Acute Inpatient Hospital | Attending: Family Medicine | Admitting: Family Medicine

## 2022-03-27 ENCOUNTER — Encounter (HOSPITAL_COMMUNITY): Payer: Self-pay

## 2022-03-27 ENCOUNTER — Other Ambulatory Visit (HOSPITAL_COMMUNITY): Payer: Self-pay

## 2022-03-27 DIAGNOSIS — Y846 Urinary catheterization as the cause of abnormal reaction of the patient, or of later complication, without mention of misadventure at the time of the procedure: Secondary | ICD-10-CM | POA: Diagnosis present

## 2022-03-27 DIAGNOSIS — F17210 Nicotine dependence, cigarettes, uncomplicated: Secondary | ICD-10-CM | POA: Diagnosis present

## 2022-03-27 DIAGNOSIS — G928 Other toxic encephalopathy: Secondary | ICD-10-CM | POA: Diagnosis present

## 2022-03-27 DIAGNOSIS — N390 Urinary tract infection, site not specified: Secondary | ICD-10-CM | POA: Diagnosis present

## 2022-03-27 DIAGNOSIS — R29898 Other symptoms and signs involving the musculoskeletal system: Secondary | ICD-10-CM | POA: Diagnosis not present

## 2022-03-27 DIAGNOSIS — D649 Anemia, unspecified: Secondary | ICD-10-CM | POA: Diagnosis present

## 2022-03-27 DIAGNOSIS — L89892 Pressure ulcer of other site, stage 2: Secondary | ICD-10-CM | POA: Diagnosis present

## 2022-03-27 DIAGNOSIS — Z993 Dependence on wheelchair: Secondary | ICD-10-CM

## 2022-03-27 DIAGNOSIS — T83518A Infection and inflammatory reaction due to other urinary catheter, initial encounter: Secondary | ICD-10-CM | POA: Diagnosis present

## 2022-03-27 DIAGNOSIS — K592 Neurogenic bowel, not elsewhere classified: Secondary | ICD-10-CM | POA: Diagnosis present

## 2022-03-27 DIAGNOSIS — R471 Dysarthria and anarthria: Secondary | ICD-10-CM | POA: Diagnosis present

## 2022-03-27 DIAGNOSIS — G9341 Metabolic encephalopathy: Principal | ICD-10-CM | POA: Diagnosis present

## 2022-03-27 DIAGNOSIS — F418 Other specified anxiety disorders: Secondary | ICD-10-CM | POA: Diagnosis not present

## 2022-03-27 DIAGNOSIS — S300XXD Contusion of lower back and pelvis, subsequent encounter: Secondary | ICD-10-CM | POA: Diagnosis not present

## 2022-03-27 DIAGNOSIS — G35 Multiple sclerosis: Secondary | ICD-10-CM | POA: Diagnosis not present

## 2022-03-27 DIAGNOSIS — Z978 Presence of other specified devices: Secondary | ICD-10-CM

## 2022-03-27 DIAGNOSIS — Z9689 Presence of other specified functional implants: Secondary | ICD-10-CM | POA: Diagnosis present

## 2022-03-27 DIAGNOSIS — M79602 Pain in left arm: Secondary | ICD-10-CM | POA: Diagnosis not present

## 2022-03-27 DIAGNOSIS — Z888 Allergy status to other drugs, medicaments and biological substances status: Secondary | ICD-10-CM

## 2022-03-27 DIAGNOSIS — Z79899 Other long term (current) drug therapy: Secondary | ICD-10-CM

## 2022-03-27 DIAGNOSIS — M62838 Other muscle spasm: Secondary | ICD-10-CM | POA: Diagnosis present

## 2022-03-27 DIAGNOSIS — J302 Other seasonal allergic rhinitis: Secondary | ICD-10-CM | POA: Diagnosis present

## 2022-03-27 DIAGNOSIS — G4700 Insomnia, unspecified: Secondary | ICD-10-CM | POA: Diagnosis present

## 2022-03-27 DIAGNOSIS — E860 Dehydration: Secondary | ICD-10-CM | POA: Diagnosis present

## 2022-03-27 DIAGNOSIS — Z66 Do not resuscitate: Secondary | ICD-10-CM | POA: Diagnosis present

## 2022-03-27 DIAGNOSIS — F419 Anxiety disorder, unspecified: Secondary | ICD-10-CM | POA: Diagnosis present

## 2022-03-27 DIAGNOSIS — M79601 Pain in right arm: Secondary | ICD-10-CM | POA: Diagnosis not present

## 2022-03-27 DIAGNOSIS — B961 Klebsiella pneumoniae [K. pneumoniae] as the cause of diseases classified elsewhere: Secondary | ICD-10-CM | POA: Diagnosis present

## 2022-03-27 DIAGNOSIS — F32A Depression, unspecified: Secondary | ICD-10-CM | POA: Diagnosis present

## 2022-03-27 DIAGNOSIS — Z7189 Other specified counseling: Secondary | ICD-10-CM | POA: Diagnosis not present

## 2022-03-27 DIAGNOSIS — G479 Sleep disorder, unspecified: Secondary | ICD-10-CM | POA: Diagnosis not present

## 2022-03-27 DIAGNOSIS — R1310 Dysphagia, unspecified: Secondary | ICD-10-CM | POA: Diagnosis not present

## 2022-03-27 DIAGNOSIS — L89526 Pressure-induced deep tissue damage of left ankle: Secondary | ICD-10-CM | POA: Diagnosis present

## 2022-03-27 DIAGNOSIS — H532 Diplopia: Secondary | ICD-10-CM | POA: Diagnosis not present

## 2022-03-27 DIAGNOSIS — Z681 Body mass index (BMI) 19 or less, adult: Secondary | ICD-10-CM

## 2022-03-27 DIAGNOSIS — Z881 Allergy status to other antibiotic agents status: Secondary | ICD-10-CM

## 2022-03-27 DIAGNOSIS — K219 Gastro-esophageal reflux disease without esophagitis: Secondary | ICD-10-CM | POA: Diagnosis present

## 2022-03-27 DIAGNOSIS — K047 Periapical abscess without sinus: Secondary | ICD-10-CM | POA: Diagnosis present

## 2022-03-27 DIAGNOSIS — R609 Edema, unspecified: Secondary | ICD-10-CM | POA: Diagnosis present

## 2022-03-27 DIAGNOSIS — N319 Neuromuscular dysfunction of bladder, unspecified: Secondary | ICD-10-CM | POA: Diagnosis present

## 2022-03-27 DIAGNOSIS — R627 Adult failure to thrive: Secondary | ICD-10-CM | POA: Diagnosis present

## 2022-03-27 DIAGNOSIS — R490 Dysphonia: Secondary | ICD-10-CM | POA: Diagnosis present

## 2022-03-27 DIAGNOSIS — E876 Hypokalemia: Secondary | ICD-10-CM | POA: Diagnosis present

## 2022-03-27 DIAGNOSIS — Z789 Other specified health status: Secondary | ICD-10-CM | POA: Diagnosis not present

## 2022-03-27 DIAGNOSIS — Z885 Allergy status to narcotic agent status: Secondary | ICD-10-CM

## 2022-03-27 DIAGNOSIS — Z1621 Resistance to vancomycin: Secondary | ICD-10-CM | POA: Diagnosis present

## 2022-03-27 DIAGNOSIS — T380X5A Adverse effect of glucocorticoids and synthetic analogues, initial encounter: Secondary | ICD-10-CM | POA: Diagnosis present

## 2022-03-27 DIAGNOSIS — K5900 Constipation, unspecified: Secondary | ICD-10-CM | POA: Diagnosis present

## 2022-03-27 DIAGNOSIS — M7989 Other specified soft tissue disorders: Secondary | ICD-10-CM | POA: Diagnosis not present

## 2022-03-27 DIAGNOSIS — R252 Cramp and spasm: Secondary | ICD-10-CM | POA: Diagnosis not present

## 2022-03-27 DIAGNOSIS — R1312 Dysphagia, oropharyngeal phase: Secondary | ICD-10-CM | POA: Diagnosis not present

## 2022-03-27 DIAGNOSIS — Z515 Encounter for palliative care: Secondary | ICD-10-CM

## 2022-03-27 DIAGNOSIS — R491 Aphonia: Secondary | ICD-10-CM | POA: Diagnosis not present

## 2022-03-27 DIAGNOSIS — G35A Relapsing-remitting multiple sclerosis: Secondary | ICD-10-CM | POA: Diagnosis present

## 2022-03-27 DIAGNOSIS — R5381 Other malaise: Secondary | ICD-10-CM | POA: Diagnosis present

## 2022-03-27 DIAGNOSIS — S9031XD Contusion of right foot, subsequent encounter: Secondary | ICD-10-CM | POA: Diagnosis not present

## 2022-03-27 DIAGNOSIS — R739 Hyperglycemia, unspecified: Secondary | ICD-10-CM | POA: Diagnosis present

## 2022-03-27 LAB — GLUCOSE, CAPILLARY
Glucose-Capillary: 69 mg/dL — ABNORMAL LOW (ref 70–99)
Glucose-Capillary: 92 mg/dL (ref 70–99)
Glucose-Capillary: 91 mg/dL (ref 70–99)
Glucose-Capillary: 124 mg/dL — ABNORMAL HIGH (ref 70–99)

## 2022-03-27 LAB — HEMOGLOBIN A1C
Hgb A1c MFr Bld: 5.3 % (ref 4.8–5.6)
Mean Plasma Glucose: 105.41 mg/dL

## 2022-03-27 MED ORDER — FESOTERODINE FUMARATE ER 8 MG PO TB24
20.0000 mg | ORAL_TABLET | Freq: Two times a day (BID) | ORAL | Status: DC
  Administered 2022-03-27 – 2022-03-28 (×3): 20 mg via ORAL
  Filled 2022-03-27 (×4): qty 1

## 2022-03-27 MED ORDER — INSULIN ASPART 100 UNIT/ML IJ SOLN
0.0000 [IU] | Freq: Three times a day (TID) | INTRAMUSCULAR | Status: DC
  Administered 2022-03-28: 2 [IU] via SUBCUTANEOUS
  Administered 2022-03-28: 3 [IU] via SUBCUTANEOUS

## 2022-03-27 MED ORDER — VITAMIN B-12 1000 MCG PO TABS
2500.0000 ug | ORAL_TABLET | Freq: Every day | ORAL | Status: DC
  Administered 2022-03-28 – 2022-04-03 (×7): 2500 ug via ORAL
  Filled 2022-03-27 (×2): qty 5
  Filled 2022-03-27 (×4): qty 3
  Filled 2022-03-27: qty 5
  Filled 2022-03-27 (×2): qty 3

## 2022-03-27 MED ORDER — BUPROPION HCL ER (XL) 150 MG PO TB24
300.0000 mg | ORAL_TABLET | Freq: Every day | ORAL | Status: DC
  Administered 2022-03-28 – 2022-04-03 (×7): 300 mg via ORAL
  Filled 2022-03-27 (×7): qty 2

## 2022-03-27 MED ORDER — POLYETHYLENE GLYCOL 3350 17 G PO PACK
17.0000 g | PACK | Freq: Every day | ORAL | Status: DC
  Administered 2022-03-28 – 2022-04-02 (×6): 17 g via ORAL
  Filled 2022-03-27 (×6): qty 1

## 2022-03-27 MED ORDER — ACETAMINOPHEN 325 MG PO TABS
650.0000 mg | ORAL_TABLET | Freq: Four times a day (QID) | ORAL | Status: DC | PRN
  Administered 2022-03-27 – 2022-04-03 (×16): 650 mg via ORAL
  Filled 2022-03-27 (×17): qty 2

## 2022-03-27 MED ORDER — SODIUM CHLORIDE 0.9 % IV SOLN
2.0000 g | Freq: Two times a day (BID) | INTRAVENOUS | Status: DC
  Administered 2022-03-27: 2 g via INTRAVENOUS
  Filled 2022-03-27: qty 12.5

## 2022-03-27 MED ORDER — METHOCARBAMOL 500 MG PO TABS
500.0000 mg | ORAL_TABLET | Freq: Four times a day (QID) | ORAL | Status: DC | PRN
  Administered 2022-03-28 – 2022-03-29 (×3): 500 mg via ORAL
  Filled 2022-03-27 (×3): qty 1

## 2022-03-27 MED ORDER — NUTRISOURCE FIBER PO PACK
1.0000 | PACK | Freq: Every day | ORAL | Status: DC
  Administered 2022-03-28 – 2022-04-02 (×6): 1 via ORAL
  Filled 2022-03-27 (×7): qty 1

## 2022-03-27 MED ORDER — OXYCODONE HCL 5 MG PO TABS
10.0000 mg | ORAL_TABLET | Freq: Four times a day (QID) | ORAL | Status: DC | PRN
  Administered 2022-03-27 – 2022-03-30 (×9): 10 mg via ORAL
  Filled 2022-03-27 (×9): qty 2

## 2022-03-27 MED ORDER — SODIUM CHLORIDE 0.9% FLUSH: 3.0000 mL | INTRAVENOUS | Status: DC | PRN

## 2022-03-27 MED ORDER — PSYLLIUM 0.36 G PO CAPS
5.0000 | ORAL_CAPSULE | Freq: Every day | ORAL | Status: DC
  Administered 2022-03-28 – 2022-04-03 (×6): 5 via ORAL
  Filled 2022-03-27 (×7): qty 1

## 2022-03-27 MED ORDER — CARBOXYMETHYLCELLULOSE SODIUM 0.5 % OP SOLN: 1.0000 [drp] | Freq: Three times a day (TID) | OPHTHALMIC | Status: DC | PRN

## 2022-03-27 MED ORDER — SODIUM CHLORIDE 0.9% FLUSH
3.0000 mL | Freq: Two times a day (BID) | INTRAVENOUS | Status: DC
  Administered 2022-03-27 – 2022-04-03 (×14): 3 mL via INTRAVENOUS

## 2022-03-27 MED ORDER — METHADONE HCL 5 MG PO TABS
5.0000 mg | ORAL_TABLET | Freq: Two times a day (BID) | ORAL | Status: DC
  Administered 2022-03-28 (×2): 5 mg via ORAL
  Filled 2022-03-27 (×3): qty 1

## 2022-03-27 MED ORDER — SODIUM CHLORIDE 0.9 % IV SOLN
1.0000 g | Freq: Two times a day (BID) | INTRAVENOUS | Status: DC
  Filled 2022-03-27: qty 10

## 2022-03-27 MED ORDER — CYPROHEPTADINE HCL 4 MG PO TABS
4.0000 mg | ORAL_TABLET | Freq: Two times a day (BID) | ORAL | Status: DC
  Administered 2022-03-27 – 2022-03-28 (×2): 4 mg via ORAL
  Filled 2022-03-27 (×3): qty 1

## 2022-03-27 MED ORDER — SENNOSIDES-DOCUSATE SODIUM 8.6-50 MG PO TABS
3.0000 | ORAL_TABLET | Freq: Every day | ORAL | Status: DC
  Administered 2022-03-28 – 2022-04-03 (×7): 3 via ORAL
  Filled 2022-03-27 (×8): qty 3

## 2022-03-27 MED ORDER — OXYCODONE HCL 5 MG PO TABS: 10.0000 mg | ORAL_TABLET | Freq: Three times a day (TID) | ORAL | Status: DC

## 2022-03-27 MED ORDER — POLYVINYL ALCOHOL 1.4 % OP SOLN: 1.0000 [drp] | Freq: Three times a day (TID) | OPHTHALMIC | Status: DC | PRN

## 2022-03-27 MED ORDER — DOCUSATE SODIUM 100 MG PO CAPS
100.0000 mg | ORAL_CAPSULE | Freq: Every day | ORAL | Status: DC
  Administered 2022-03-27 – 2022-03-31 (×5): 100 mg via ORAL
  Filled 2022-03-27 (×10): qty 1

## 2022-03-27 MED ORDER — POTASSIUM CHLORIDE CRYS ER 20 MEQ PO TBCR
20.0000 meq | EXTENDED_RELEASE_TABLET | ORAL | Status: DC
  Administered 2022-03-29 – 2022-04-02 (×3): 20 meq via ORAL
  Filled 2022-03-27 (×4): qty 1

## 2022-03-27 MED ORDER — SODIUM CHLORIDE 0.9 % IV SOLN
1000.0000 mg | Freq: Every day | INTRAVENOUS | Status: DC
  Administered 2022-03-27: 1000 mg via INTRAVENOUS
  Filled 2022-03-27 (×2): qty 16

## 2022-03-27 MED ORDER — DALFAMPRIDINE ER 10 MG PO TB12
10.0000 mg | ORAL_TABLET | Freq: Two times a day (BID) | ORAL | Status: DC
  Administered 2022-03-28 – 2022-04-03 (×13): 10 mg via ORAL
  Filled 2022-03-27 (×15): qty 1

## 2022-03-27 MED ORDER — BIOTENE DRY MOUTH MT LIQD: 15.0000 mL | OROMUCOSAL | Status: DC | PRN

## 2022-03-27 MED ORDER — SODIUM CHLORIDE 0.9 % IV SOLN
1000.0000 mg | Freq: Every day | INTRAVENOUS | Status: DC
  Filled 2022-03-27: qty 16

## 2022-03-27 MED ORDER — FUROSEMIDE 40 MG PO TABS
80.0000 mg | ORAL_TABLET | Freq: Every day | ORAL | Status: DC
  Administered 2022-03-28: 80 mg via ORAL
  Filled 2022-03-27: qty 2

## 2022-03-27 MED ORDER — VITAMIN D 25 MCG (1000 UNIT) PO TABS
5000.0000 [IU] | ORAL_TABLET | Freq: Every day | ORAL | Status: DC
  Administered 2022-03-28 – 2022-04-03 (×7): 5000 [IU] via ORAL
  Filled 2022-03-27 (×10): qty 5

## 2022-03-27 MED ORDER — ENOXAPARIN SODIUM 40 MG/0.4ML IJ SOSY
40.0000 mg | PREFILLED_SYRINGE | INTRAMUSCULAR | Status: DC
  Administered 2022-03-28 – 2022-04-03 (×7): 40 mg via SUBCUTANEOUS
  Filled 2022-03-27 (×7): qty 0.4

## 2022-03-27 MED ORDER — DIAZEPAM 5 MG PO TABS
7.5000 mg | ORAL_TABLET | Freq: Three times a day (TID) | ORAL | Status: DC
  Administered 2022-03-27 – 2022-03-28 (×3): 7.5 mg via ORAL
  Filled 2022-03-27 (×3): qty 2

## 2022-03-27 MED ORDER — METHYLCELLULOSE (LAXATIVE) PO POWD: 1.0000 | Freq: Every day | ORAL | Status: DC

## 2022-03-27 MED ORDER — INSULIN ASPART 100 UNIT/ML IJ SOLN: 0.0000 [IU] | Freq: Three times a day (TID) | INTRAMUSCULAR | Status: DC

## 2022-03-27 MED ORDER — NAPROXEN 250 MG PO TABS
500.0000 mg | ORAL_TABLET | Freq: Two times a day (BID) | ORAL | Status: DC
  Administered 2022-03-27 – 2022-04-03 (×15): 500 mg via ORAL
  Filled 2022-03-27 (×15): qty 2

## 2022-03-27 MED ORDER — SODIUM CHLORIDE 0.9 % IV SOLN: 250.0000 mL | INTRAVENOUS | Status: DC | PRN

## 2022-03-27 MED ORDER — BACLOFEN 10 MG PO TABS
20.0000 mg | ORAL_TABLET | Freq: Two times a day (BID) | ORAL | Status: DC
  Administered 2022-03-27: 20 mg via ORAL
  Administered 2022-03-28: 40 mg via ORAL
  Filled 2022-03-27: qty 2
  Filled 2022-03-27: qty 4

## 2022-03-27 MED ORDER — VITAMIN C 500 MG PO TABS
1000.0000 mg | ORAL_TABLET | Freq: Every day | ORAL | Status: DC
  Administered 2022-03-28 – 2022-04-03 (×7): 1000 mg via ORAL
  Filled 2022-03-27 (×7): qty 2

## 2022-03-27 MED ORDER — CALCIUM CARBONATE ANTACID 500 MG PO CHEW: 1.0000 | CHEWABLE_TABLET | Freq: Three times a day (TID) | ORAL | Status: DC | PRN

## 2022-03-27 MED ORDER — BACLOFEN 40 MG/20ML IT SOLN: 80.0000 mg | Freq: Once | INTRATHECAL | Status: DC

## 2022-03-27 MED ORDER — AMANTADINE HCL 100 MG PO CAPS
100.0000 mg | ORAL_CAPSULE | Freq: Three times a day (TID) | ORAL | Status: DC
  Administered 2022-03-27 – 2022-04-03 (×21): 100 mg via ORAL
  Filled 2022-03-27 (×22): qty 1

## 2022-03-27 MED ORDER — TIZANIDINE HCL 4 MG PO TABS
8.0000 mg | ORAL_TABLET | Freq: Three times a day (TID) | ORAL | Status: DC
  Administered 2022-03-27 – 2022-03-28 (×2): 8 mg via ORAL
  Filled 2022-03-27 (×2): qty 2

## 2022-03-27 NOTE — Assessment & Plan Note (Addendum)
Per d/c summary from rehab pump setting was 468.5 mcg/day  Plan Continue baclofen pump dose.

## 2022-03-27 NOTE — Progress Notes (Signed)
Hypoglycemic Event  CBG: 69   Treatment: 4 oz juice/soda  Symptoms: Pale and Sweaty  Follow-up CBG: Time:1013   CBG Result:92  Possible Reasons for Event: Inadequate meal intake  Comments/MD notified: Dr. Lorenza Burton

## 2022-03-27 NOTE — Assessment & Plan Note (Signed)
Continue home meds °

## 2022-03-27 NOTE — Progress Notes (Signed)
Inpatient Rehabilitation Discharge Medication Review by a Pharmacist  A complete drug regimen review was completed for this patient to identify any potential clinically significant medication issues.  High Risk Drug Classes Is patient taking? Indication by Medication  Antipsychotic No   Anticoagulant Yes Lovenox - DVT px  Antibiotic Yes Cefepime - UTI  Opioid Yes Methadone/Nucynta - pain  Antiplatelet No   Hypoglycemics/insulin No   Vasoactive Medication No   Chemotherapy No   Other Yes Baclofen pump - spasms/MS Dalfampridine - MS Zanaflex - spasms Singulair - allergy Robaxin/diazepam - spasms Wellbutrin - depression Amantatine - MS Miralax/psyllium/Senna - constipation KCL/Tums/vitamin D - supplementation Methadone - pain Naproxen - pain [place] - OAB Premarin - menopausal Cyproheptadine - allergy Lasix - fluid management     Type of Medication Issue Identified Description of Issue Recommendation(s)  Drug Interaction(s) (clinically significant)     Duplicate Therapy     Allergy     No Medication Administration End Date     Incorrect Dose     Additional Drug Therapy Needed     Significant med changes from prior encounter (inform family/care partners about these prior to discharge).    Other       Clinically significant medication issues were identified that warrant physician communication and completion of prescribed/recommended actions by midnight of the next day:  No  Name of provider notified for urgent issues identified:   Provider Method of Notification:     Pharmacist comments:   Time spent performing this drug regimen review (minutes):  [place], PharmD, [place], AAHIVP, CPP Infectious Disease Pharmacist 03/27/2022 3:27 PM

## 2022-03-27 NOTE — Assessment & Plan Note (Signed)
Due to increased weakness and indwelling foley catheter UA done on rehab returning as positive with 11-20 WBC/hpf. Patient had foley exchanged and was started on cefepime on rehab.  Plan Continue cefepime for full 5 day course of treatment

## 2022-03-27 NOTE — Progress Notes (Signed)
Inpatient Rehabilitation Care Coordinator Discharge Note   Patient Details  Name: Sharon Odom MRN: [id number] Date of Birth: [birth year]   Discharge location: [place]  Length of Stay: 14 DAYS  Discharge activity level: MAX-TOATL WHEELCHAIR LEVEL  Home/community participation: [place]  Patient response XL:KGMWNU Literacy - How often do you need to have someone help you when you read instructions, pamphlets, or other written material from your doctor or pharmacy?: Never  Patient response UV:OZDGUY Isolation - How often do you feel lonely or isolated from those around you?: Rarely  Services provided included: MD, RD, PT, OT, SLP, CM, RN, TR, Pharmacy, Neuropsych, SW  Financial Services:  Charity fundraiser Utilized: [place] offered to/list presented to: PT  Follow-up services arranged:  Home Health, Patient/Family request agency [place]: [facility]  Patient response to transportation need: Is the patient able to respond to transportation needs?: Yes In the past 12 months, has lack of transportation kept you from medical appointments or from getting medications?: No In the past 12 months, has lack of transportation kept you from meetings, work, or from getting things needed for daily living?: No    Comments (or additional information):TRANSFERRING TO ACUTE DUE TO MEDICAL ISSUES, MAY COME BACK TO [place] TO DECLINE DUE TO [place]. [employer] CM MADE AWARE OF TRANSFER  Patient/Family verbalized understanding of follow-up arrangements:  Yes  Individual responsible for coordination of the follow-up plan: [name]-HUSBAND [phone]  Confirmed correct DME delivered: Elease Hashimoto 03/27/2022    Elease Hashimoto

## 2022-03-27 NOTE — Progress Notes (Signed)
Inpatient Rehabilitation Admissions Coordinator   Patient admitted to acute from CIR. I will follow her progress to assist with dispo when appropriate.  Danne Baxter, RN, MSN Rehab Admissions Coordinator [phone] 03/27/2022 2:53 PM

## 2022-03-27 NOTE — Consult Note (Addendum)
NEURO HOSPITALIST CONSULT NOTE   Requestig physician: Dr. Berline Chough  Reason for Consult: Multiple sclerosis exacerbation  History obtained from:  Patient and Chart     HPI:                                                                                                                                          Sharon Odom is an 51 y.o. female with a past medical history of secondary progressive multiple sclerosis . She was initially admitted to CIR on 10/17 for increased spasticity and worsening mobility. She had her baclofen pump revised on 01/11/2022 as well as a 5% increase of the medication on 03/02/2022. [name] she had rapidly progressive weakness affecting her left arm and leg to the point of flaccidity on the left in the past 24-48 hrs. MRI revealed new acute lesions in the occipital region.  Today, decision was made to start her on 5 days of high-dose steroids for possible MS exacerbation after telephone discussion between Rehab team and Neurology, given her worsening, a punctate, faint enhancing lesion seen on brain MRI and the patient's stated feeling that she is currently having an MS exacerbation. She has received one dose of steroids.  On further review of her chart at the time of Neurology consultation it is documented that she also has had a fever of 101.2 and a UA that showed bacteria with a white blood cell count of 11-20.  Culture was positive for Klebsiella pneumonia in Enterobacter Colace.  She was started on cefepime 2 g every 8 hours.  She was not able to participate in rehab and was subsequently transferred to inpatient.  CIR Timeline 10/17 - Ocycodone increased to q4hrs PRN. Baclofen 20mg  daily prn, naprosyn 500mg  BID.  10/19- ITB pump increased to 334mcg/day 10/20- MBS and advanced to regular diet with thin liquids 10/21- Methadone 2.5mg  BID.  10/22- Pump increased on 476mcg/day  10/23- Methadone increased to 5mg  BID and oxycodone 10mg   q4hr 10/24- Pump increased to 468.5mg  .  10/25- amantadine 100mg  TID started 10/27- increased dysarthria and sleepiness- foley changed urine sent- klebsiella and enterobacter isolated- febrile- cefepime started 10/30- acute demylination noted on MRI 10/31- solu medrol 1000mg  daily x 5 days ordered  Past Medical History:  Diagnosis Date   Allergies    Anxiety    Arthritis    Asthma    seasonal - pollen   Constipation    Depression    Dyspnea    with exertion   GERD (gastroesophageal reflux disease)    Headache    History of hiatal hernia    Left radial nerve palsy 03/15/2020   resolved - Left arm/hand weak   Multiple sclerosis (HCC)    Pneumonia 2007   x 1  Vision abnormalities    Wears glasses     Past Surgical History:  Procedure Laterality Date   COLONOSCOPY     EXCISION MORTON'S NEUROMA Right    fallopian tube removal     INTRATHECAL PUMP IMPLANT Right 02/18/2019   Procedure: INTRATHECAL PUMP IMPLANT;  Surgeon: Odette Fraction, MD;  Location: [facility] OR;  Service: Neurosurgery;  Laterality: Right;  INTRATHECAL PUMP IMPLANT   INTRATHECAL PUMP IMPLANT Right 02/17/2021   Procedure: Baclofen Pump Replacement;  Surgeon: Maeola Harman, MD;  Location: [facility] OR;  Service: Neurosurgery;  Laterality: Right;  3C/RM 21   INTRATHECAL PUMP REVISION N/A 01/11/2022   Procedure: Baclofen Pump revision;  Surgeon: Dawley, Alan Mulder, DO;  Location: [facility] OR;  Service: Neurosurgery;  Laterality: N/A;  RM 21 to follow   PAIN PUMP IMPLANTATION N/A 09/28/2021   Procedure: Insertion of baclofen pump, right lower quadrant;  Surgeon: Dawley, Alan Mulder, DO;  Location: [facility] OR;  Service: Neurosurgery;  Laterality: N/A;   PAIN PUMP REMOVAL Left 04/06/2021   Procedure: Removal of baclofen pump, Left;  Surgeon: Dawley, Alan Mulder, DO;  Location: [facility] OR;  Service: Neurosurgery;  Laterality: Left;  Lateral/Left//3C rm 19   UPPER GI ENDOSCOPY     WISDOM TOOTH EXTRACTION     WISDOM TOOTH EXTRACTION      Family History   Problem Relation Age of Onset   Diabetes Mother    Healthy Brother     Social History:  reports that she has been smoking cigarettes. She started smoking about 35 years ago. She has a 16.00 pack-year smoking history. She has never used smokeless tobacco. She reports that she does not currently use alcohol. She reports that she does not use drugs.  Allergies  Allergen Reactions   Ziconotide Acetate Shortness Of Breath    Anorexia, weird sensations, could not talk, choking feeling, lost since of taste and smell. Hallucinations, vivid dreams. Confusion and sleepiness. N/v, increase in pain.  Anorexia, weird sensations, could not talk, choking feeling, lost since of taste and smell. Hallucinations, vivid dreams. Confusion and sleepiness. N/v, increase in pain.    Morphine Other (See Comments)    Pt does not recall reaction    Amoxicillin Nausea And Vomiting   Dantrolene Other (See Comments)    Muscle pain   Lamotrigine Other (See Comments)    Join pain   Lyrica [Pregabalin] Nausea And Vomiting    MEDICATIONS:                                                                                                                     Prior to Admission:  Medications Prior to Admission  Medication Sig Dispense Refill Last Dose   albuterol (VENTOLIN HFA) 108 (90 Base) MCG/ACT inhaler Inhale 1-2 puffs into the lungs every 6 (six) hours as needed for wheezing or shortness of breath. 6.7 g 1    amantadine (SYMMETREL) 100 MG capsule Take 1 capsule (100 mg total) by mouth 3 (three) times daily. 60 capsule 0  antiseptic oral rinse (BIOTENE) LIQD 15 mLs by Mouth Rinse route as needed for dry mouth.      Ascorbic Acid (VITAMIN C) 1000 MG tablet Take 1,000 mg by mouth daily.      baclofen (LIORESAL) 20 MG tablet Take 2 tablets (40 mg total) by mouth daily at 2 PM. 30 each 0    baclofen (LIORESAL) 20 MG tablet Take 1-2 tablets (20-40 mg total) by mouth 2 (two) times daily. 30 each 0    buPROPion (WELLBUTRIN  XL) 300 MG 24 hr tablet TAKE 1 TABLET DAILY 90 tablet 3    calcium carbonate (TUMS - DOSED IN MG ELEMENTAL CALCIUM) 500 MG chewable tablet Chew 1 tablet (200 mg of elemental calcium total) by mouth 3 (three) times daily as needed for indigestion or heartburn.      carboxymethylcellulose (REFRESH PLUS) 0.5 % SOLN Place 1 drop into both eyes 3 (three) times daily as needed (dry eyes).      Cholecalciferol (VITAMIN D-3) 125 MCG (5000 UT) TABS Take 5,000 Units by mouth daily. 30 tablet 1    conjugated estrogens (PREMARIN) vaginal cream Place 1 Application vaginally 3 (three) times a week. Blueberry size amount for application (Patient not taking: Reported on 03/13/2022)      Cyanocobalamin (VITAMIN B-12) 2500 MCG SUBL Place 1 tablet (2,500 mcg total) under the tongue daily. 30 tablet 0    cyproheptadine (PERIACTIN) 4 MG tablet       dalfampridine 10 MG TB12 Take 1 tablet (10 mg total) by mouth 2 (two) times daily. One po bid 180 tablet 3    diazepam (VALIUM) 5 MG tablet Take 1.5 tablets (7.5 mg total) by mouth every 8 (eight) hours. 140 tablet 5    Docusate Sodium (DSS) 100 MG CAPS Take by mouth.      furosemide (LASIX) 40 MG tablet Take 2 tablets (80 mg total) by mouth daily. 30 tablet 0    Green Tea 150 MG CAPS Green Tea Complex  500 mg daily      methadone (DOLOPHINE) 5 MG tablet Take 1 tablet (5 mg total) by mouth every 12 (twelve) hours. 60 tablet 0    methocarbamol (ROBAXIN) 500 MG tablet Take 1 tablet (500 mg total) by mouth every 6 (six) hours as needed for muscle spasms. 30 tablet 0    methylcellulose (CITRUCEL) oral powder Take by mouth.      montelukast (SINGULAIR) 10 MG tablet TAKE 1 TABLET DAILY (Patient not taking: Reported on 03/13/2022) 90 tablet 3    naproxen (NAPROSYN) 500 MG tablet Take 1 tablet (500 mg total) by mouth 2 (two) times daily with a meal.      polyethylene glycol (MIRALAX / GLYCOLAX) 17 g packet Take 17 g by mouth daily. 14 each 0    potassium chloride SA (KLOR-CON M)  20 MEQ tablet Take 1 tablet (20 mEq total) by mouth every other day while taking Lasix 15 tablet 0    Psyllium (METAMUCIL) 0.36 g CAPS Take 5 capsules by mouth daily.      ranitidine (ZANTAC) 300 MG capsule Take by mouth. (Patient not taking: Reported on 03/13/2022)      senna-docusate (SENOKOT-S) 8.6-50 MG tablet Take 3 tablets by mouth daily.      tiZANidine (ZANAFLEX) 4 MG tablet TAKE 2 TABLETS THREE TIMES A DAY (Patient taking differently: Take 8 mg by mouth 3 (three) times daily.) 720 tablet 3    trospium (SANCTURA) 20 MG tablet Take 20 mg by mouth 2 (  two) times daily.      vitamin A 3 MG (10000 UNITS) capsule Take by mouth. (Patient not taking: Reported on 03/13/2022)      Scheduled:  amantadine  100 mg Oral TID   vitamin C  1,000 mg Oral Daily   baclofen  80 mg Intrathecal Once   baclofen  20-40 mg Oral BID   [START ON 03/28/2022] buPROPion  300 mg Oral Daily   cholecalciferol  5,000 Units Oral Daily   cyanocobalamin  2,500 mcg Oral Daily   cyproheptadine  4 mg Oral BID   dalfampridine  10 mg Oral BID   diazepam  7.5 mg Oral Q8H   docusate sodium  100 mg Oral QHS   enoxaparin (LOVENOX) injection  40 mg Subcutaneous Q24H   fesoterodine  20 mg Oral BID   [START ON 03/28/2022] fiber  1 packet Oral Daily   furosemide  80 mg Oral Daily   methadone  5 mg Oral Q12H   naproxen  500 mg Oral BID WC   oxyCODONE  10 mg Oral Q8H   polyethylene glycol  17 g Oral Daily   potassium chloride SA  20 mEq Oral QODAY   Psyllium  5 capsule Oral Daily   senna-docusate  3 tablet Oral Daily   sodium chloride flush  3 mL Intravenous Q12H   tiZANidine  8 mg Oral TID     ROS:                                                                                                                                       History obtained from chart review and the patient  General ROS: negative for - chills, fatigue, fever, night sweats, weight gain or weight loss Psychological ROS: negative for - behavioral  disorder, hallucinations, memory difficulties, mood swings or suicidal ideation Ophthalmic ROS: negative for - blurry vision, double vision, eye pain or loss of vision ENT ROS: negative for - epistaxis, nasal discharge, oral lesions, sore throat, tinnitus or vertigo Allergy and Immunology ROS: negative for - hives or itchy/watery eyes Hematological and Lymphatic ROS: negative for - bleeding problems, bruising or swollen lymph nodes Endocrine ROS: negative for - galactorrhea, hair pattern changes, polydipsia/polyuria or temperature intolerance Respiratory ROS: negative for - cough, hemoptysis, shortness of breath or wheezing Cardiovascular ROS: negative for - chest pain, dyspnea on exertion, edema or irregular heartbeat Gastrointestinal ROS: negative for - abdominal pain, diarrhea, hematemesis, nausea/vomiting or stool incontinence Genito-Urinary ROS: negative for - dysuria, hematuria, incontinence or urinary frequency/urgency Musculoskeletal ROS: negative for - joint swelling or muscular weakness Neurological ROS: as noted in HPI Dermatological ROS: negative for rash and skin lesion changes   There were no vitals taken for this visit.   General Examination:  Physical Exam  HEENT-  Normocephalic, no lesions, without obvious abnormality.  Normal external eye and conjunctiva.   Cardiovascular- S1-S2 audible, pulses palpable throughout   Lungs-no rhonchi or wheezing noted, no excessive working breathing.  Saturations within normal limits Abdomen- All 4 quadrants palpated and nontender Extremities- Warm, dry and intact Musculoskeletal-no joint tenderness, deformity or swelling Skin-warm and dry, no hyperpigmentation, vitiligo, or suspicious lesions  Neurological Examination Mental Status: Alert, oriented, thought content appropriate.  Speech fluent without evidence of a receptive or  expressive aphasia. However, there is significant dysarthria and hypophonia making her speech output difficult to understand. Able to follow 3 step commands without difficulty. Cranial Nerves: II: Visual fields grossly normal. PERRL.  III,IV, VI: Mild ptosis bilaterally, EOM are conjugate but with decreased excursion to the far left and right in horizontal gaze, in addition to coarse nystagmus.  V,VII: Facial light touch sensation normal bilaterally, transverse smile is noted.  VIII: Hearing intact to voice IX,X: Hypophonic speech XI: Symmetric shoulder shrug XII: midline tongue extension Motor: Right : Upper extremity   5/5    Left:     Upper extremity   1/5  Lower extremity   3/5     Lower extremity   1/5 Tone and bulk: left arm and leg are nearly flaccid with flicker of movement. Muscle bulk is decreased Neck flexion is weak and she is unable to fully lift her head off the pillow  Sensory: Pinprick and light touch intact throughout, bilaterally Deep Tendon Reflexes: 1+ and symmetric throughout Plantars: Right: downgoing   Left: downgoing Cerebellar: Finger-to-nose intact on the right  Gait: Unable to assess   Lab Results: Basic Metabolic Panel: Recent Labs  Lab 03/23/22 0553 03/26/22 0506  NA 140 143  K 3.9 4.0  CL 102 104  CO2 31 27  GLUCOSE 91 91  BUN 12 19  CREATININE 0.87 1.20*  CALCIUM 9.5 9.1     CBC: Recent Labs  Lab 03/24/22 1119 03/26/22 0506  WBC 10.1 8.0  NEUTROABS 8.1* 5.9  HGB 11.6* 10.2*  HCT 35.8* 31.4*  MCV 98.9 97.5  PLT 341 309     Cardiac Enzymes: No results for input(s): "CKTOTAL", "CKMB", "CKMBINDEX", "TROPONINI" in the last 168 hours.  Lipid Panel: No results for input(s): "CHOL", "TRIG", "HDL", "CHOLHDL", "VLDL", "LDLCALC" in the last 168 hours.  Imaging: MR BRAIN W WO CONTRAST  Result Date: 03/26/2022 CLINICAL DATA:  Left greater than right spasticity in the setting of progressive MS. EXAM: MRI HEAD WITHOUT AND WITH CONTRAST  TECHNIQUE: Multiplanar, multiecho pulse sequences of the brain and surrounding structures were obtained without and with intravenous contrast. CONTRAST:  61mL GADAVIST GADOBUTROL 1 MMOL/ML IV SOLN COMPARISON:  MRI Brain 11/12/21 FINDINGS: Brain: No acute infarction, hemorrhage, hydrocephalus, extra-axial collection or mass lesion. Redemonstrated are extensive T2/FLAIR hyperintense periventricular and subcortical T2/FLAIR hyperintense lesions which have a morphology that is consistent with a demyelinating process. No diffusion restricted lesions are seen. There is a focal contrast-enhancing lesion in the left occipital lobe (series 21, image 26). There are multiple lesions fat demonstrate marked hypointensity on T1 weighted imaging, suggestive chronic volume loss. T2 hyperintense lesions are also seen in the pons in the visualized cervical spinal cord. Vascular: Normal flow voids. Skull and upper cervical spine: Normal marrow signal. Sinuses/Orbits: Negative. Other: None. IMPRESSION: Extensive T2/FLAIR hyperintense periventricular and subcortical lesions which have a morphology that is consistent with a demyelinating process. There is a focal contrast-enhancing lesion in the left occipital lobe, which could  suggest active demyelination active demyelination. Electronically Signed   By: Lorenza Cambridge M.D.   On: 03/26/2022 17:02     Assessment: 51 y.o. female with a past medical history of secondary progressive multiple sclerosis who initially presented to our acute inpatient rehab for physical therapy and titration of her baclofen pump.  While there she had an acute worsening of her weakness in her left arm and left leg, in addition to lethargy and diffuse fatiguability.  It was also discovered that she does have Klebsiella and Enterobacter bacteria in her urine. MRI showed a punctate, faint enhancing lesion on a background of lesions consistent with MS, but no enhancing lesion corresponding to the expected anatomical  location that would result in increased weakness.  - She is currently on cefepime however we do recommend switching this to Zosyn due to the risk of neurotoxicity.   - Given that her worsening has a high likelihood of being a pseudoexacerbation secondary to infection rather than a true MS exacerbation, we do recommend holding further high-dose steroids until her infection is cleared.   - If she continues to have weakness worse than her baseline after completion of antibiotic treatment, restarting high dose steroids can be considered at that time.   Impression: High suspicion that the infection is causing a worsening of the her pre-existing deficits as opposed to this being an acute exacerbation of multiple sclerosis.    Recommendations: - Recommend switching cefepime to zosyn 3.375 due to risk of neurotoxicity - Hold steroids while her infection is active, may reconsider after infection clears   Patient seen and examined by NP/APP with MD.  Elmer Picker, DNP, FNP-BC [facility] Pager: [phone]  I have seen and examined the patient. I have formulated the assessment and recommendations. 52 y.o. female with a past medical history of secondary progressive multiple sclerosis, on the rehab service for physical therapy and titration of her baclofen pump, who has had an acute worsening of her weakness in the context of fever and Klebsiella and Enterobacter bacteria in her urine. MRI showed a punctate, faint enhancing lesion on a background of lesions consistent with MS, but no enhancing lesion corresponding to the expected anatomical location that would result in increased weakness. Exam as above. High suspicion that the infection is causing a worsening of the her pre-existing deficits as opposed to this being an acute exacerbation of multiple sclerosis. Recommend switching cefepime to zosyn 3.375 due to risk of neurotoxicity. Hold steroids while her infection is active, may reconsider  after infection clears. Electronically signed: Dr. Caryl Pina

## 2022-03-27 NOTE — Progress Notes (Signed)
PROGRESS NOTE   Subjective/Complaints:  MRI shows acute demyelination-  Says vision blurry- crying because cannot communicate- having to repeat herself and hard to understand;     ROS: Limited by sedation   Except for HPI  Objective:   MR BRAIN W WO CONTRAST  Result Date: 03/26/2022 CLINICAL DATA:  Left greater than right spasticity in the setting of progressive MS. EXAM: MRI HEAD WITHOUT AND WITH CONTRAST TECHNIQUE: Multiplanar, multiecho pulse sequences of the brain and surrounding structures were obtained without and with intravenous contrast. CONTRAST:  54mL GADAVIST GADOBUTROL 1 MMOL/ML IV SOLN COMPARISON:  MRI Brain 11/12/21 FINDINGS: Brain: No acute infarction, hemorrhage, hydrocephalus, extra-axial collection or mass lesion. Redemonstrated are extensive T2/FLAIR hyperintense periventricular and subcortical T2/FLAIR hyperintense lesions which have a morphology that is consistent with a demyelinating process. No diffusion restricted lesions are seen. There is a focal contrast-enhancing lesion in the left occipital lobe (series 21, image 26). There are multiple lesions fat demonstrate marked hypointensity on T1 weighted imaging, suggestive chronic volume loss. T2 hyperintense lesions are also seen in the pons in the visualized cervical spinal cord. Vascular: Normal flow voids. Skull and upper cervical spine: Normal marrow signal. Sinuses/Orbits: Negative. Other: None. IMPRESSION: Extensive T2/FLAIR hyperintense periventricular and subcortical lesions which have a morphology that is consistent with a demyelinating process. There is a focal contrast-enhancing lesion in the left occipital lobe, which could suggest active demyelination active demyelination. Electronically Signed   By: Lorenza Cambridge M.D.   On: 03/26/2022 17:02   Recent Labs    03/24/22 1119 03/26/22 0506  WBC 10.1 8.0  HGB 11.6* 10.2*  HCT 35.8* 31.4*  PLT 341 309     Recent Labs    03/26/22 0506  NA 143  K 4.0  CL 104  CO2 27  GLUCOSE 91  BUN 19  CREATININE 1.20*  CALCIUM 9.1      Intake/Output Summary (Last 24 hours) at 03/27/2022 3710 Last data filed at 03/26/2022 1926 Gross per 24 hour  Intake 700 ml  Output 1150 ml  Net -450 ml        Physical Exam: Vital Signs Blood pressure 112/69, pulse 86, temperature 98.7 F (37.1 C), resp. rate 16, height 5\' 9"  (1.753 m), weight 60.4 kg, SpO2 95 %.     General: barely awake- difficulty keeping her eyes open; sitting up in bed; very hard to understand due to dysphonia. Almost aphonia- NAD HENT:  oropharynx extremely dry- needed biotene-c/o blurry vision  CV: regular rate; no JVD Pulmonary: CTA B/L; no W/R/R-decreased at bases- a little cough, but clear on exam GI: soft, NT, ND, (+)BS Psychiatric: lethargic Neurological: confused;  LUE 1/5; barely moving; RUE 4+/5 but very slow to move; RLE- 2-/5 at best and LLE basically 0/5 which is worse than normal Sensory exam normal sensation to light touch and proprioception in bilateral upper and lower extremities Cerebellar exam normal finger to nose to finger as well as heel to shin in bilateral upper and lower extremities Musculoskeletal: Full range of motion in all 4 extremities. No joint swelling  Right upper ext 4/5 Left upper ext 2/5 usually, but c/o heaviness- still ~ 2- to 2/5, but  took a very long time to respond to commands, not clear if cognitive or strength Right lower 2-/5, left lower trace No evidence of clus at ankles MAS 1 R quad, MAS 0 left quad  Psych: interactive; slightly flat affect; slightly depressed       Assessment/Plan: 1. Functional deficits which require 3+ hours per day of interdisciplinary therapy in a comprehensive inpatient rehab setting. Physiatrist is providing close team supervision and 24 hour management of active medical problems listed below. Physiatrist and rehab team continue to assess barriers  to discharge/monitor patient progress toward functional and medical goals  Care Tool:  Bathing  Bathing activity did not occur: Refused Body parts bathed by patient: Left arm, Chest, Abdomen, Front perineal area, Buttocks, Right upper leg, Left upper leg, Face, Right lower leg, Left lower leg, Right arm   Body parts bathed by helper: Right lower leg, Left lower leg     Bathing assist Assist Level: Contact Guard/Touching assist     Upper Body Dressing/Undressing Upper body dressing   What is the patient wearing?: Pull over shirt, Bra    Upper body assist Assist Level: Contact Guard/Touching assist    Lower Body Dressing/Undressing Lower body dressing      What is the patient wearing?: Pants, Underwear/pull up     Lower body assist Assist for lower body dressing: Minimal Assistance - Patient > 75%     Toileting Toileting    Toileting assist Assist for toileting: Minimal Assistance - Patient > 75%     Transfers Chair/bed transfer  Transfers assist  Chair/bed transfer activity did not occur: Safety/medical concerns  Chair/bed transfer assist level: Minimal Assistance - Patient > 75%     Locomotion Ambulation   Ambulation assist   Ambulation activity did not occur: Safety/medical concerns (unable to perform due to tone, strength, activity tolerance, and coordination deficits)          Walk 10 feet activity   Assist  Walk 10 feet activity did not occur: Safety/medical concerns (unable to perform due to tone, strength, activity tolerance, and coordination deficits)        Walk 50 feet activity   Assist Walk 50 feet with 2 turns activity did not occur: Safety/medical concerns (unable to perform due to tone, strength, activity tolerance, and coordination deficits)         Walk 150 feet activity   Assist Walk 150 feet activity did not occur: Safety/medical concerns (unable to perform due to tone, strength, activity tolerance, and coordination  deficits)         Walk 10 feet on uneven surface  activity   Assist Walk 10 feet on uneven surfaces activity did not occur: Safety/medical concerns (unable to perform due to tone, strength, activity tolerance, and coordination deficits)         Wheelchair     Assist Is the patient using a wheelchair?: Yes Type of Wheelchair: Power    Wheelchair assist level: Independent Max wheelchair distance: 300 ft    Wheelchair 50 feet with 2 turns activity    Assist    Wheelchair 50 feet with 2 turns activity did not occur:  (unable to perform due to tone, strength, activity tolerance, and coordination deficits)   Assist Level: Independent   Wheelchair 150 feet activity     Assist  Wheelchair 150 feet activity did not occur:  (unable to perform due to tone, strength, activity tolerance, and coordination deficits)   Assist Level: Independent   Blood pressure 112/69, pulse  86, temperature 98.7 F (37.1 C), resp. rate 16, height 5\' 9"  (1.753 m), weight 60.4 kg, SpO2 95 %.  Medical Problem List and Plan: 1. Functional deficits secondary to secondary progressive multiple sclerosis with spasticity and uncontrolled pain- and need to titration up of ITB pump - pt's level of function has been declining for last few months- and now is max-total A             -patient may  shower             -ELOS/Goals: min Assist 14-18 days    Con't CIR- PT, OT - done with SLP  Not ready for d/c today due to fever 10/28, UCx >100K Kleb, await sensitivities  10/30- Has Klebsiella and Enterobacter- 100k- keep on Cefepime due to confusion- will wait for now to change to Cipro- as long as cognition better.   10/31- has acute MS exacerbation- starting IV Solumedrol as below- will see if can transfer to acute since cannot participate in rehab,   2.  Antithrombotics: -DVT/anticoagulation:  Pharmaceutical: Lovenox             -antiplatelet therapy: none 3. Pain/spasticity management:  -baclofen  IT pump- will titrate while here             -baclofen 40 mg-  -oxycodone 15 mg - will increase to q4 hours prn for therapy.  -dantrolene 50 mg -tizanidine 4 mg 10/18- will add Baclofen 20 mg daily prn for during day when spasticity worse; also d/c ibuprofen and add Aleve 500 mg BID- at 6am/6pm 10/23- added methadone 2.5 mg BID on 10/21, increase to 5mg  today , pt has not taken prn oxy IR 15mg  will reduce to 10mg  given higher dose of methadone 10/25- pain doing better- con't methadone and Oxy at lower dose.   10/26- more pain this AM, but once got pain meds, was feeling a little better- -con't regimen- don't feel comfortable increasing methadone for 1 month- send home on Oxycodone 10 mg pills 10/27- pt said having more sleepiness- said can decide over weekend if goes back to just oxy or continue methadone/Oxy dosing.  10/28- more drowsy on combination of methadone and oxy on trial of nucynta 50mg  Q6 prn- only took 1 dose yesterday 10/30- will d/c Nucynta tomorrow if MRI of brain (-) since that could be cause of increased sedation.  10/31-  4. Mood/Behavior/Sleep: LCSW to evaluate and provide emotional support             -continue Wellbutrin XL 300 mg daily             -continue Lamictal 25 mg 2 tabs BID             -antipsychotic agents: n/a 5. Neuropsych/cognition: This patient is capable of making decisions on her own behalf. 6. Skin/Wound Care: Routine skin care checks 7. Fluids/Electrolytes/Nutrition: Routine Is and Os and follow-up chemistries             -continue vitamin supplements (C, D3, B12) 8: GERD: continue Zantac  10/24- add Pepcid and continue Protonix.  9: Neurogenic bladder: continue Foley catheter- last changed 03/01/22- will need to be changed prior to d/c.   10/25- d/w nursing to get it changed Saturday 10: Bowel program: continue psyllium, Miralax, Senekot  101/9- increased senna to 3 tabs daily and added miralax daily.  10/21- no BM since admission- will give Sorbitol  and SSE if needed after therapy today  10/22- at least 2 mushy stools last  night-  10/24- is oozing stool, so doesn't want to take bowel meds for a few days- I think she doesn't have much control, but worse when stool loose.   10/25- will reduce Senna to 3 tabs/day since still oozing stool- think she doesn't have enough  control of bowels.  also OT explained that she was having a lot of stool without control in shower today- hopefully with that and possible another dose of Sorbitol after therapy today, she can get cleaned out.   10/27- No results with sorbitol or SSE- will try Lactulose 30G and see if results- if none, will need more intervention 11: Edema: continue Lasix 40 mg daily- also OT explained that she was having a lot of stool without control in shower today- hopefully with that and possible another dose of Sorbitol after therapy today, she can get cleaned out.  12: Season allergies: continue Afrin PRN, Singulair daily 13: Post-menopausal: continue Premarin vaginal cream  14. Dysphagia/resp issues- will get CXR and Slp eval for swallowing issues- had been on Nectar thick liquids prior.   10/20- MBS today  10/21- can stay on thin liquids with effortful swallows and secondary swallow-   10/24- on regular diet with secondary swallow  15. Tooth abscess- con't Flagyl- finish Friday 250 mg TID.  10/24- done  16. Hypokalemia  10/24- K+ 3.4 yesterday- will increase Kcl to 20 mEq daily.   10/26- will recheck in AM  10/27- K 3.9 17. ITB pump/Spasticity  10/19- will increase ITB pump today- increased from 386 mcg/day to 425 mcg/day- depending on her results, will see when to titrate her up next?  10/24- will increase ITB pump today- went up 10.1% to 468.5 mcg/day- new fill date 05/09/22   10/26- increased to 491.4 mcg/day- new refill date 05/07/22- increased 5%  10/27- likely making her constipation much worse 18 Has foley- but more sleepy  10/27- will change foley then check U/A and Cx to  make sure not having a UTI which would make dysarthria/sleepiness worse. Since had bowel incontinence, is possible.   10/30- has UTI- >100k- as above  10/31- foley changed 10/27 19. LUE weakness?-  10/30- will get MRI- of brain to assess- still not back quite yet.   10/31- has demyelination going on- will order Solumedrol 1000 mg daily IV x 5 days- will also check BMP, CBC with diff and CBGs/SSI for next 5 days- will also see if can be sent to Acute hospital, since pt is so lethargic and confused, and weak, cannot participate in therapy.  20. AKI  10/31- will recheck labs daily and replete since likely due to dehydration/not drinking enough     I spent a total of 59   minutes on total care today- >50% coordination of care- due to reassessing pt, d/w nursing, and charge nurse and PA; as well as calling husband; doing team conference and calling Neuro consult.    LOS: 14 days A FACE TO FACE EVALUATION WAS PERFORMED  Brittnye Josephs 03/27/2022, 8:32 AM

## 2022-03-27 NOTE — Assessment & Plan Note (Signed)
Underlying the acute change in the last week, husband and PM&R have noted a slow progressive decline in strength and ability to transfer over months.

## 2022-03-27 NOTE — H&P (Signed)
History and Physical    Sharon Odom AUQ:[id number] DOB: 1971-04-18 DOA: 03/27/2022  DOS: the patient was seen and examined on 03/27/2022  PCP: Charisse March, NP-C   Patient coming from:  transfer from in-patient rehab.  I have personally briefly reviewed patient's old medical records in [facility]  Sharon Odom, a 51 y/o woman with h/o intermittent relapsing MS. She has been on rehab for management and dose adjustment of her intra-thecal baclofen pump and evaluation for progressive weakness. Related problems include dysphonia, constipation, neurogenic bladder with in-dwelling foley catheter, h/o sacral decubitus ulcer, dysphagie. [name] she had rapidly progressive weakness affecting her left arm and leg to the point of flaccidity on the left in the past 24-48 hrs. MRI revealed new acute lesions in the occipital region. Neuro consult recommended five days of high dose IV steroids. [name] to 101.2. U/A with many bacteria and 11-20 WBC/hpf. Urine culture positve for Klebsiella pneumoniae and enterobacter cloace. Macrodantin discontinued and patient started on cefepime 2g 2g q 8. [name] and is being transferred to in-patient care. TR[facility] called to admit and continue management.   ED Course: not seen in ED  Review of Systems:  Review of Systems  Constitutional:  Positive for fever and weight loss. Negative for chills.  HENT:  Negative for congestion, hearing loss and sore throat.   Eyes:  Negative for blurred vision, pain and discharge.       Visual difficulty with lid-lag MS related  Respiratory:  Negative for cough, shortness of breath and wheezing.   Cardiovascular:  Negative for chest pain, palpitations and leg swelling.  Gastrointestinal:  Positive for constipation. Negative for abdominal pain, heartburn and nausea.       Eructation - difficult to have free passage of air 2/2 MS  Genitourinary:  Negative for  dysuria.       Has indwelling Foley catheter   Musculoskeletal:  Positive for back pain.  Skin:  Negative for rash.       Report of prior decubitus ulcer not open currently but patient c/o discomfort in the sacral area  Neurological:  Positive for tingling and focal weakness. Negative for headaches.       Reports "pins and needles" right foot since last intra-thecal pump placement.  Increased weakness LUE and LLE - cannot not move actively.  Endo/Heme/Allergies:  Does not bruise/bleed easily.  Psychiatric/Behavioral:  Negative for depression and hallucinations. The patient is not nervous/anxious.     Past Medical History:  Diagnosis Date   Allergies    Anxiety    Arthritis    Asthma    seasonal - pollen   Constipation    Depression    Dyspnea    with exertion   GERD (gastroesophageal reflux disease)    Headache    History of hiatal hernia    Left radial nerve palsy 03/15/2020   resolved - Left arm/hand weak   Multiple sclerosis (HCC)    Pneumonia 2007   x 1   Vision abnormalities    Wears glasses     Past Surgical History:  Procedure Laterality Date   COLONOSCOPY     EXCISION MORTON'S NEUROMA Right    fallopian tube removal     INTRATHECAL PUMP IMPLANT Right 02/18/2019   Procedure: INTRATHECAL PUMP IMPLANT;  Surgeon: Odette Fraction, MD;  Location: [facility] OR;  Service: Neurosurgery;  Laterality: Right;  INTRATHECAL PUMP IMPLANT   INTRATHECAL  PUMP IMPLANT Right 02/17/2021   Procedure: Baclofen Pump Replacement;  Surgeon: Maeola Harman, MD;  Location: [facility] OR;  Service: Neurosurgery;  Laterality: Right;  3C/RM 21   INTRATHECAL PUMP REVISION N/A 01/11/2022   Procedure: Baclofen Pump revision;  Surgeon: Dawley, Alan Mulder, DO;  Location: [facility] OR;  Service: Neurosurgery;  Laterality: N/A;  RM 21 to follow   PAIN PUMP IMPLANTATION N/A 09/28/2021   Procedure: Insertion of baclofen pump, right lower quadrant;  Surgeon: Dawley, Alan Mulder, DO;  Location: [facility] OR;  Service: Neurosurgery;   Laterality: N/A;   PAIN PUMP REMOVAL Left 04/06/2021   Procedure: Removal of baclofen pump, Left;  Surgeon: Dawley, Alan Mulder, DO;  Location: [facility] OR;  Service: Neurosurgery;  Laterality: Left;  Lateral/Left//3C rm 19   UPPER GI ENDOSCOPY     WISDOM TOOTH EXTRACTION     WISDOM TOOTH EXTRACTION      Social Hx - married, no children. 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 Lives at home with husband. Uses power wheelchair for mobility, utilized a lift for transfers.    reports that she has been smoking cigarettes. She started smoking about 35 years ago. She has a 16.00 pack-year smoking history. She has never used smokeless tobacco. She reports that she does not currently use alcohol. She reports that she does not use drugs.  Allergies  Allergen Reactions   Ziconotide Acetate Shortness Of Breath    Anorexia, weird sensations, could not talk, choking feeling, lost since of taste and smell. Hallucinations, vivid dreams. Confusion and sleepiness. N/v, increase in pain.  Anorexia, weird sensations, could not talk, choking feeling, lost since of taste and smell. Hallucinations, vivid dreams. Confusion and sleepiness. N/v, increase in pain.    Morphine Other (See Comments)    Pt does not recall reaction    Amoxicillin Nausea And Vomiting   Dantrolene Other (See Comments)    Muscle pain   Lamotrigine Other (See Comments)    Join pain   Lyrica [Pregabalin] Nausea And Vomiting    Family History  Problem Relation Age of Onset   Diabetes Mother    Healthy Brother     Prior to Admission medications   Medication Sig Start Date End Date Taking? Authorizing Provider  albuterol (VENTOLIN HFA) 108 (90 Base) MCG/ACT inhaler Inhale 1-2 puffs into the lungs every 6 (six) hours as needed for wheezing or shortness of breath. 04/15/19   Angiulli, Mcarthur Rossetti, PA-C  amantadine (SYMMETREL) 100 MG capsule Take 1 capsule (100 mg total) by mouth 3 (three) times daily. 03/23/22   Setzer, Lynnell Jude, PA-C  antiseptic oral rinse  (BIOTENE) LIQD 15 mLs by Mouth Rinse route as needed for dry mouth.    [provider]  Ascorbic Acid (VITAMIN C) 1000 MG tablet Take 1,000 mg by mouth daily.    [provider]  baclofen (LIORESAL) 20 MG tablet Take 2 tablets (40 mg total) by mouth daily at 2 PM. 03/24/22   Setzer, Lynnell Jude, PA-C  baclofen (LIORESAL) 20 MG tablet Take 1-2 tablets (20-40 mg total) by mouth 2 (two) times daily. 03/23/22   Setzer, Lynnell Jude, PA-C  buPROPion (WELLBUTRIN XL) 300 MG 24 hr tablet TAKE 1 TABLET DAILY 04/20/20   Lovorn, Aundra Millet, MD  calcium carbonate (TUMS - DOSED IN MG ELEMENTAL CALCIUM) 500 MG chewable tablet Chew 1 tablet (200 mg of elemental calcium total) by mouth 3 (three) times daily as needed for indigestion or heartburn. 03/23/22   Setzer, Lynnell Jude, PA-C  carboxymethylcellulose (  REFRESH PLUS) 0.5 % SOLN Place 1 drop into both eyes 3 (three) times daily as needed (dry eyes). 03/23/22   Setzer, Edman Circle, PA-C  Cholecalciferol (VITAMIN D-3) 125 MCG (5000 UT) TABS Take 5,000 Units by mouth daily. 04/15/19   Angiulli, Lavon Paganini, PA-C  conjugated estrogens (PREMARIN) vaginal cream Place 1 Application vaginally 3 (three) times a week. Blueberry size amount for application Patient not taking: Reported on 03/13/2022    [provider]  Cyanocobalamin (VITAMIN B-12) 2500 MCG SUBL Place 1 tablet (2,500 mcg total) under the tongue daily. 03/23/22   Setzer, Edman Circle, PA-C  cyproheptadine (PERIACTIN) 4 MG tablet     [provider]  dalfampridine 10 MG TB12 Take 1 tablet (10 mg total) by mouth 2 (two) times daily. One po bid 12/14/21   Sater, Nanine Means, MD  diazepam (VALIUM) 5 MG tablet Take 1.5 tablets (7.5 mg total) by mouth every 8 (eight) hours. 01/10/22   Lovorn, Jinny Blossom, MD  Docusate Sodium (DSS) 100 MG CAPS Take by mouth.    [provider]  furosemide (LASIX) 40 MG tablet Take 2 tablets (80 mg total) by mouth daily. 03/23/22   Setzer, Edman Circle, PA-C  Green Tea 150 MG  CAPS Green Tea Complex  500 mg daily    [provider]  methadone (DOLOPHINE) 5 MG tablet Take 1 tablet (5 mg total) by mouth every 12 (twelve) hours. 03/23/22   Setzer, Edman Circle, PA-C  methocarbamol (ROBAXIN) 500 MG tablet Take 1 tablet (500 mg total) by mouth every 6 (six) hours as needed for muscle spasms. 03/23/22   Setzer, Edman Circle, PA-C  methylcellulose (CITRUCEL) oral powder Take by mouth.    [provider]  montelukast (SINGULAIR) 10 MG tablet TAKE 1 TABLET DAILY Patient not taking: Reported on 03/13/2022 04/20/20   Courtney Heys, MD  naproxen (NAPROSYN) 500 MG tablet Take 1 tablet (500 mg total) by mouth 2 (two) times daily with a meal. 03/23/22   Setzer, Edman Circle, PA-C  polyethylene glycol (MIRALAX / GLYCOLAX) 17 g packet Take 17 g by mouth daily. 03/24/22   Setzer, Edman Circle, PA-C  potassium chloride SA (KLOR-CON M) 20 MEQ tablet Take 1 tablet (20 mEq total) by mouth every other day while taking Lasix 03/23/22   Setzer, Edman Circle, PA-C  Psyllium (METAMUCIL) 0.36 g CAPS Take 5 capsules by mouth daily. 03/24/22   Setzer, Edman Circle, PA-C  ranitidine (ZANTAC) 300 MG capsule Take by mouth. Patient not taking: Reported on 03/13/2022    [provider]  senna-docusate (SENOKOT-S) 8.6-50 MG tablet Take 3 tablets by mouth daily. 03/24/22   Setzer, Edman Circle, PA-C  tiZANidine (ZANAFLEX) 4 MG tablet TAKE 2 TABLETS THREE TIMES A DAY Patient taking differently: Take 8 mg by mouth 3 (three) times daily. 10/12/21   Lovorn, Jinny Blossom, MD  trospium (SANCTURA) 20 MG tablet Take 20 mg by mouth 2 (two) times daily.    [provider]  vitamin A 3 MG (10000 UNITS) capsule Take by mouth. Patient not taking: Reported on 03/13/2022    [provider]    Physical Exam: There were no vitals filed for this visit.  Physical Exam Vitals and nursing note reviewed.  Constitutional:      General: She is not in acute distress.    Appearance: She is ill-appearing. She is  not toxic-appearing.     Comments: Dysphonia and very soft phonation. Uses a voice amplifier to good effect.   HENT:  Head: Atraumatic.     Comments: Templar wasting noted    Nose: Nose normal.     Mouth/Throat:     Mouth: Mucous membranes are moist.     Pharynx: Oropharynx is clear. No oropharyngeal exudate.     Comments: Native dentition Eyes:     General: No scleral icterus.    Extraocular Movements: Extraocular movements intact.     Conjunctiva/sclera: Conjunctivae normal.     Pupils: Pupils are equal, round, and reactive to light.     Comments: Lid lag right worse than left.   Cardiovascular:     Rate and Rhythm: Normal rate and regular rhythm.     Pulses: Normal pulses.     Heart sounds: Normal heart sounds. No murmur heard. Pulmonary:     Effort: Pulmonary effort is normal. No respiratory distress.     Breath sounds: Normal breath sounds. No wheezing or rales.  Abdominal:     General: Bowel sounds are normal.     Palpations: Abdomen is soft.     Tenderness: There is no abdominal tenderness.  Musculoskeletal:        General: No swelling, tenderness or deformity.     Cervical back: No rigidity or tenderness.     Right lower leg: No edema.     Left lower leg: No edema.  Lymphadenopathy:     Cervical: No cervical adenopathy.  Skin:    General: Skin is warm and dry.     Comments: Unable to roll over for examination of sacrum  Neurological:     Mental Status: She is alert and oriented to person, place, and time.     Cranial Nerves: No cranial nerve deficit.     Comments: CN grossly nl except for lid-lag, dysphonia with very soft phonation. MS - LUE flacid, no grip strength, decreased DTR at biceps.         RUE - nl movement, able to move against gravity, offer some resistance, grip                    Strength 3/6         LLE - flacid, unable to move actively.         RLE - strength 3/6, able to lift against gravity, offers minimal resistance.     Psychiatric:         Mood and Affect: Mood normal.        Behavior: Behavior normal.        Thought Content: Thought content normal.      Labs on Admission: I have personally reviewed following labs and imaging studies  CBC: Recent Labs  Lab 03/24/22 1119 03/26/22 0506  WBC 10.1 8.0  NEUTROABS 8.1* 5.9  HGB 11.6* 10.2*  HCT 35.8* 31.4*  MCV 98.9 97.5  PLT 341 309   Basic Metabolic Panel: Recent Labs  Lab 03/23/22 0553 03/26/22 0506  NA 140 143  K 3.9 4.0  CL 102 104  CO2 31 27  GLUCOSE 91 91  BUN 12 19  CREATININE 0.87 1.20*  CALCIUM 9.5 9.1   GFR: Estimated Creatinine Clearance: 53.5 mL/min (A) (by C-G formula based on SCr of 1.2 mg/dL (H)). Liver Function Tests: No results for input(s): "AST", "ALT", "ALKPHOS", "BILITOT", "PROT", "ALBUMIN" in the last 168 hours. No results for input(s): "LIPASE", "AMYLASE" in the last 168 hours. No results for input(s): "AMMONIA" in the last 168 hours. Coagulation Profile: No results for input(s): "INR", "PROTIME" in the last 168  hours. Cardiac Enzymes: No results for input(s): "CKTOTAL", "CKMB", "CKMBINDEX", "TROPONINI" in the last 168 hours. BNP (last 3 results) No results for input(s): "PROBNP" in the last 8760 hours. HbA1C: Recent Labs    03/26/22 0506  HGBA1C 5.3   CBG: Recent Labs  Lab 03/25/22 2102 03/27/22 0852 03/27/22 1013 03/27/22 1249  GLUCAP 88 69* 92 91   Lipid Profile: No results for input(s): "CHOL", "HDL", "LDLCALC", "TRIG", "CHOLHDL", "LDLDIRECT" in the last 72 hours. Thyroid Function Tests: No results for input(s): "TSH", "T4TOTAL", "FREET4", "T3FREE", "THYROIDAB" in the last 72 hours. Anemia Panel: No results for input(s): "VITAMINB12", "FOLATE", "FERRITIN", "TIBC", "IRON", "RETICCTPCT" in the last 72 hours. Urine analysis:    Component Value Date/Time   COLORURINE YELLOW 03/23/2022 1419   APPEARANCEUR HAZY (A) 03/23/2022 1419   APPEARANCEUR Clear 11/11/2019 1559   LABSPEC 1.013 03/23/2022 1419   PHURINE  6.0 03/23/2022 1419   GLUCOSEU NEGATIVE 03/23/2022 1419   HGBUR NEGATIVE 03/23/2022 1419   BILIRUBINUR NEGATIVE 03/23/2022 1419   BILIRUBINUR Negative 11/11/2019 1559   KETONESUR NEGATIVE 03/23/2022 1419   PROTEINUR NEGATIVE 03/23/2022 1419   NITRITE POSITIVE (A) 03/23/2022 1419   LEUKOCYTESUR SMALL (A) 03/23/2022 1419    Radiological Exams on Admission: I have personally reviewed images MR BRAIN W WO CONTRAST  Result Date: 03/26/2022 CLINICAL DATA:  Left greater than right spasticity in the setting of progressive MS. EXAM: MRI HEAD WITHOUT AND WITH CONTRAST TECHNIQUE: Multiplanar, multiecho pulse sequences of the brain and surrounding structures were obtained without and with intravenous contrast. CONTRAST:  6mL GADAVIST GADOBUTROL 1 MMOL/ML IV SOLN COMPARISON:  MRI Brain 11/12/21 FINDINGS: Brain: No acute infarction, hemorrhage, hydrocephalus, extra-axial collection or mass lesion. Redemonstrated are extensive T2/FLAIR hyperintense periventricular and subcortical T2/FLAIR hyperintense lesions which have a morphology that is consistent with a demyelinating process. No diffusion restricted lesions are seen. There is a focal contrast-enhancing lesion in the left occipital lobe (series 21, image 26). There are multiple lesions fat demonstrate marked hypointensity on T1 weighted imaging, suggestive chronic volume loss. T2 hyperintense lesions are also seen in the pons in the visualized cervical spinal cord. Vascular: Normal flow voids. Skull and upper cervical spine: Normal marrow signal. Sinuses/Orbits: Negative. Other: None. IMPRESSION: Extensive T2/FLAIR hyperintense periventricular and subcortical lesions which have a morphology that is consistent with a demyelinating process. There is a focal contrast-enhancing lesion in the left occipital lobe, which could suggest active demyelination active demyelination. Electronically Signed   By: Lorenza Cambridge M.D.   On: 03/26/2022 17:02    EKG: I have  personally reviewed EKG: no EKG on chart  Assessment/Plan Principal Problem:   Multiple sclerosis exacerbation (HCC) Active Problems:   Muscle spasticity   Steroid-induced hyperglycemia   UTI (urinary tract infection) with pyuria   Depression with anxiety   Presence of intrathecal baclofen pump    Assessment and Plan: * Multiple sclerosis exacerbation (HCC) Patient with intermittent relapsing MS with neurogenic bladder, spasticity and intrathecal baclofen pump. Was doing OK on Rehap - working up source of increased spasticity and symptoms. With progressive weakness, dysphonia MRI ordered revealing acute exacerbation of MS. Neurology on call recommended 5 days IV steroids, hence transfer to in-patient  Plan Solumedrol 1,000 mg IV daily x 5 days  Continue home meds for management of chronic sypmtoms, including baclofen  Continue pain mgt with methadone and oxycodone  Glycemic protocol for steroid induced hyperglycemia  Neuro was consulted with ask for follow-up  UTI (urinary tract infection) with pyuria Due to  increased weakness and indwelling foley catheter UA done on rehab returning as positive with 11-20 WBC/hpf. Patient had foley exchanged and was started on cefepime on rehab.  Plan Continue cefepime for full 5 day course of treatment  Steroid-induced hyperglycemia Sliding scale coverage. Will get baseline A1C  Muscle spasticity See MS esacerbation note. Continue home meds including baclofen by intra-thecal pump and oral dosing. Continue diazepam.  Presence of intrathecal baclofen pump Per d/c summary from rehab pump setting was 468.5 mcg/day  Plan Continue baclofen pump dose.   Depression with anxiety Continue home meds       DVT prophylaxis: Lovenox Code Status: Full Code Family Communication: spoke with Edgar Frisk, spouse.  Disposition Plan: TBD  Consults called: neurology - Dr. Otelia Limes  Admission status: Inpatient, Med-Surg   Illene Regulus, MD [facility] 03/27/2022, 4:37 PM

## 2022-03-27 NOTE — Assessment & Plan Note (Signed)
Sliding scale coverage. Will get baseline A1C

## 2022-03-27 NOTE — Assessment & Plan Note (Addendum)
See MS esacerbation note. Continue home meds including baclofen by intra-thecal pump and oral dosing. Continue diazepam.

## 2022-03-27 NOTE — Progress Notes (Signed)
Pt is complaining of not being able to move her right leg but later proceeds to perform range of motion independently. Pt has had several emotional breakdowns while looking in the mirror stating that she came to the hospital to get better but she is getting worse. Pt is also complaining of not being able to lift her head off of the pillow. She proceeds to use her right hand to lift her head off of the pillow while this nurse administers her meds. Minutes after this, pt was able to independently raise her head off of the pillow. No further concerns at this time, call bell in reach

## 2022-03-27 NOTE — Subjective & Objective (Signed)
Sharon Odom, a 51 y/o woman with h/o intermittent relapsing MS. She has been on rehab for management and dose adjustment of her intra-thecal baclofen pump and evaluation for progressive weakness. Related problems include dysphonia, constipation, neurogenic bladder with in-dwelling foley catheter, h/o sacral decubitus ulcer, dysphagie. [name] she had rapidly progressive weakness affecting her left arm and leg to the point of flaccidity on the left in the past 24-48 hrs. MRI revealed new acute lesions in the occipital region. Neuro consult recommended five days of high dose IV steroids. [name] to 101.2. U/A with many bacteria and 11-20 WBC/hpf. Urine culture positve for Klebsiella pneumoniae and enterobacter cloace. Macrodantin discontinued and patient started on cefepime 2g 2g q 8. [name] and is being transferred to in-patient care. [facility] called to admit and continue management.

## 2022-03-27 NOTE — TOC Transition Note (Signed)
Due to patient's changing clinical condition the [facility] has returned prior scripts back to inventory. Please re-issue prescriptions when a final discharge date is determined.

## 2022-03-28 DIAGNOSIS — M62838 Other muscle spasm: Secondary | ICD-10-CM

## 2022-03-28 DIAGNOSIS — D649 Anemia, unspecified: Secondary | ICD-10-CM | POA: Insufficient documentation

## 2022-03-28 DIAGNOSIS — Z978 Presence of other specified devices: Secondary | ICD-10-CM

## 2022-03-28 DIAGNOSIS — R627 Adult failure to thrive: Secondary | ICD-10-CM

## 2022-03-28 DIAGNOSIS — F418 Other specified anxiety disorders: Secondary | ICD-10-CM

## 2022-03-28 LAB — GLUCOSE, CAPILLARY
Glucose-Capillary: 155 mg/dL — ABNORMAL HIGH (ref 70–99)
Glucose-Capillary: 149 mg/dL — ABNORMAL HIGH (ref 70–99)
Glucose-Capillary: 167 mg/dL — ABNORMAL HIGH (ref 70–99)

## 2022-03-28 MED ORDER — PIPERACILLIN-TAZOBACTAM 3.375 G IVPB
3.3750 g | Freq: Three times a day (TID) | INTRAVENOUS | Status: AC
  Administered 2022-03-28 – 2022-04-01 (×14): 3.375 g via INTRAVENOUS
  Filled 2022-03-28 (×16): qty 50

## 2022-03-28 MED ORDER — TIZANIDINE HCL 4 MG PO TABS
4.0000 mg | ORAL_TABLET | Freq: Three times a day (TID) | ORAL | Status: DC
  Administered 2022-03-29: 4 mg via ORAL
  Filled 2022-03-28 (×2): qty 1

## 2022-03-28 MED ORDER — BACLOFEN 10 MG PO TABS
20.0000 mg | ORAL_TABLET | Freq: Two times a day (BID) | ORAL | Status: DC
  Administered 2022-03-28 – 2022-03-29 (×2): 20 mg via ORAL
  Filled 2022-03-28 (×2): qty 2

## 2022-03-28 MED ORDER — DIAZEPAM 5 MG PO TABS
5.0000 mg | ORAL_TABLET | Freq: Three times a day (TID) | ORAL | Status: DC
  Administered 2022-03-28 – 2022-03-29 (×2): 5 mg via ORAL
  Filled 2022-03-28 (×2): qty 1

## 2022-03-28 MED ORDER — SODIUM CHLORIDE 0.9 % IV SOLN: INTRAVENOUS | Status: DC

## 2022-03-28 MED ORDER — CHLORHEXIDINE GLUCONATE CLOTH 2 % EX PADS
6.0000 | MEDICATED_PAD | Freq: Every day | CUTANEOUS | Status: DC
  Administered 2022-03-28 – 2022-04-03 (×5): 6 via TOPICAL

## 2022-03-28 NOTE — Progress Notes (Signed)
Inpatient Rehabilitation Admissions Coordinator   Recommend to reorder PT, OT and SLP when appropriate. I will follow.  Danne Baxter, RN, MSN Rehab Admissions Coordinator [phone] 03/28/2022 10:55 AM

## 2022-03-28 NOTE — Progress Notes (Signed)
Progress Note   Patient: Sharon Odom FAO:[id number] DOB: 02/17/1971 DOA: 03/27/2022     1 DOS: the patient was seen and examined on 03/28/2022 at 12:25 PM      Brief hospital course: Mrs. Galer is a 51 y.o. F with relapsing progressive MS, hx baclofen pump infection Nov 2022, treated with extended IV antibiotics, now pump recently replaced (May 2023), history of thrombocytopenia from [place], anxiety, asthma and chronic pain who was recently admitted to inpatient rehab from home for gradually worsening physical status at home who had progressive weakness in the hospital and then fever.    Urine culture grew Klebsiella and Enterobacter for which she was Started on antibiotics and MRI showed new demyelinating lesions, so she was started on Solu-medrol and admitted to acute care for Neurology evaluation.   10/17: Admitted to CIR 10/27: Fever, nitrofurantoin started 10/29: Urine culture with Enterobacter, Klebsiella, changed to cefepime 10/30: MRI brain shows new lesions, Solu-medrol started 10/31: Transferred to Acute Care, Neuro consulted, Solu-medrol stopped     Assessment and Plan: * Failure to thrive in adult Husband describes slow progressive decline since arriving to Inpatient Rehab.  PM&R MD notes imply the same, gradually worsening "sleepiness", gradually worsening function.    This was in the context of new methadone, titrating baclofen pump, and new amantadine as well as fever to 101 on Oct 28.  An MRI on Oct 30 showed possible new demyelinating lesions.  Probably her differential should include acute metabolic encephalopathy due to infection (urine; blood cultures negative and no symptoms/signs of pump re-infection), sedation from polypharmacy, or progression of MS.  Neurology feel she does not have active MS nor that these are correlated with her symptoms.  -Continue IV antibiotics - Continue IV fluids - I will discuss titrating down psychoactive medications with  PM&R      Patient with intermittent relapsing MS with neurogenic bladder, spasticity and intrathecal baclofen pump. Was doing OK on Rehap - working up source of increased spasticity and symptoms. With progressive weakness, dysphonia MRI ordered revealing acute exacerbation of MS. Neurology on call recommended 5 days IV steroids, hence transfer to in-patient  Plan Solumedrol 1,000 mg IV daily x 5 days  Continue home meds for management of chronic sypmtoms, including baclofen  Continue pain mgt with methadone and oxycodone  Glycemic protocol for steroid induced hyperglycemia  Neuro was consulted with ask for follow-up  UTI (urinary tract infection) with pyuria Due to chronic indwelling Foley.  Urine culture from Foley growing Enterobacter and Klebsiella.  She has tolerated cefepime in the past, although in the setting of encephalopathy agree with transitioning to Zosyn. - Continue Zosyn day 2 of 5  Relapsing remitting multiple sclerosis (HCC) - Consult palliative care - Continue dalfampridine - Continue baclofen pump - Continue oral baclofen and cyproheptadine - Continue amantadine - Continue tizanidine, Robaxin, fesoterodine  Muscle spasticity - Continue baclofen, tizanidine, Robaxin, baclofen pump, Valium, cyproheptadine  Normocytic anemia Hemoglobin stable relative to baseline  Steroid-induced hyperglycemia A1c normal.  Resolved. - Stop insulin  Presence of intrathecal baclofen pump No redness or swelling to suggest infection  Depression with anxiety - Continue Valium and bupropion          Subjective: Patient is extremely weak, her mouth is dry, she has no new headache, chest pain, trouble breathing, change in her generalized weakness.  No improvement in her overall strength.  No further fever.  No cough or abdominal pain.     Physical Exam: BP 111/73 (BP Location: Left  Arm)   Pulse 75   Temp 99 F (37.2 C) (Oral)   Resp 18   SpO2 92%   Thin elderly  adult female, lying in bed, appears debilitated and weak RRR, normal S1 and S2, no murmurs, no peripheral edema Respiratory rate normal, lungs clear without rales or wheezes Abdomen soft, can feel the baclofen pump in right abdomen, no tenderness or swelling around, no redness, no rigidity or guarding Attention diminished, affect blunted, voice hypophonic, her left side is densely hemiparetic, her right arm she is able to move, which is close to baseline, amblyopia noted    Data Reviewed: Discussed with neurology Glucose normal Basic metabolic panel unremarkable Creatinine 1.2 up from one 0.9 CBC shows mild anemia only Urine culture from 3 days ago shows Enterobacter and Klebsiella    Family Communication: Husband by phone    Disposition: Status is: Inpatient Patient presents with subacute decline to inpatient rehab, further decline on rehab, and the new fever.  She is gotten worse rather than better while on inpatient rehab, and is unclear if this is from multiple sclerosis, polypharmacy, or infection.  Her overall condition appears to be worsening, and my hope is that this is from a reversible cause like an infection.  Will continue fluids and Abx and consult palliative care as well        Author: Alberteen Sam, MD 03/28/2022 1:51 PM  For on call review [email or web address].

## 2022-03-28 NOTE — Hospital Course (Addendum)
Sharon Odom is a 51 y.o. F with relapsing progressive MS, hx baclofen pump infection Nov 2022, treated with extended IV antibiotics, now pump recently replaced (May 2023), history of thrombocytopenia from [place], anxiety, asthma and chronic pain who was recently admitted to inpatient rehab from home for gradually worsening physical status at home who had progressive weakness in the hospital and then fever.    Urine culture grew Klebsiella and Enterobacter for which she was Started on antibiotics and MRI showed new demyelinating lesions, so she was started on Solu-medrol and admitted to acute care for Neurology evaluation.     10/17: Admitted to CIR 10/27: Fever, much more weak, encephalopathic, globally weak, nitrofurantoin started 10/29: Urine culture with Enterobacter, Klebsiella, changed to cefepime 10/30: MRI brain shows new lesions, Solu-medrol started 10/31: Transferred to Acute Care, Neuro consulted, Solu-medrol stopped 11/1: Sedating medicines titrated down 11/2: Some clinical improvement 11/3: Improved back to level when she entered inpatient rehab for treatment

## 2022-03-28 NOTE — Assessment & Plan Note (Addendum)
-   Consult palliative care - Continue dalfampridine - Continue baclofen pump - Continue oral baclofen and tizanidine at lower doses - Stop cyproheptadine and Robaxin - Continue amantadine - Continue fesoterodine

## 2022-03-28 NOTE — Plan of Care (Signed)

## 2022-03-28 NOTE — Assessment & Plan Note (Signed)
Hemoglobin stable relative to baseline 

## 2022-03-29 DIAGNOSIS — N390 Urinary tract infection, site not specified: Secondary | ICD-10-CM

## 2022-03-29 DIAGNOSIS — E876 Hypokalemia: Secondary | ICD-10-CM | POA: Insufficient documentation

## 2022-03-29 DIAGNOSIS — G9341 Metabolic encephalopathy: Secondary | ICD-10-CM

## 2022-03-29 LAB — CBC WITH DIFFERENTIAL/PLATELET
Abs Immature Granulocytes: 0.33 10*3/uL — ABNORMAL HIGH (ref 0.00–0.07)
Basophils Absolute: 0 10*3/uL (ref 0.0–0.1)
Basophils Relative: 0 %
Eosinophils Absolute: 0 10*3/uL (ref 0.0–0.5)
Eosinophils Relative: 0 %
HCT: 31.1 % — ABNORMAL LOW (ref 36.0–46.0)
Hemoglobin: 10.5 g/dL — ABNORMAL LOW (ref 12.0–15.0)
Immature Granulocytes: 2 %
Lymphocytes Relative: 12 %
Lymphs Abs: 1.7 10*3/uL (ref 0.7–4.0)
MCH: 32.1 pg (ref 26.0–34.0)
MCHC: 33.8 g/dL (ref 30.0–36.0)
MCV: 95.1 fL (ref 80.0–100.0)
Monocytes Absolute: 1.2 10*3/uL — ABNORMAL HIGH (ref 0.1–1.0)
Monocytes Relative: 9 %
Neutro Abs: 11.2 10*3/uL — ABNORMAL HIGH (ref 1.7–7.7)
Neutrophils Relative %: 77 %
Platelets: 292 10*3/uL (ref 150–400)
RBC: 3.27 MIL/uL — ABNORMAL LOW (ref 3.87–5.11)
RDW: 11.9 % (ref 11.5–15.5)
WBC: 14.5 10*3/uL — ABNORMAL HIGH (ref 4.0–10.5)
nRBC: 0 % (ref 0.0–0.2)

## 2022-03-29 LAB — COMPREHENSIVE METABOLIC PANEL
ALT: 11 U/L (ref 0–44)
AST: 12 U/L — ABNORMAL LOW (ref 15–41)
Albumin: 2.7 g/dL — ABNORMAL LOW (ref 3.5–5.0)
Alkaline Phosphatase: 62 U/L (ref 38–126)
Anion gap: 9 (ref 5–15)
BUN: 43 mg/dL — ABNORMAL HIGH (ref 6–20)
CO2: 29 mmol/L (ref 22–32)
Calcium: 9.1 mg/dL (ref 8.9–10.3)
Chloride: 102 mmol/L (ref 98–111)
Creatinine, Ser: 1.25 mg/dL — ABNORMAL HIGH (ref 0.44–1.00)
GFR, Estimated: 53 mL/min — ABNORMAL LOW (ref >60)
Glucose, Bld: 106 mg/dL — ABNORMAL HIGH (ref 70–99)
Potassium: 3.4 mmol/L — ABNORMAL LOW (ref 3.5–5.1)
Sodium: 140 mmol/L (ref 135–145)
Total Bilirubin: 0.6 mg/dL (ref 0.3–1.2)
Total Protein: 5.6 g/dL — ABNORMAL LOW (ref 6.5–8.1)

## 2022-03-29 LAB — CULTURE, BLOOD (ROUTINE X 2)
Culture: NO GROWTH
Culture: NO GROWTH
Special Requests: ADEQUATE
Special Requests: ADEQUATE

## 2022-03-29 LAB — GLUCOSE, CAPILLARY: Glucose-Capillary: 102 mg/dL — ABNORMAL HIGH (ref 70–99)

## 2022-03-29 MED ORDER — FESOTERODINE FUMARATE ER 4 MG PO TB24: 4.0000 mg | ORAL_TABLET | Freq: Every day | ORAL | Status: DC

## 2022-03-29 MED ORDER — TROSPIUM CHLORIDE 20 MG PO TABS: 20.0000 mg | ORAL_TABLET | ORAL | Status: DC

## 2022-03-29 MED ORDER — TIZANIDINE HCL 4 MG PO TABS
4.0000 mg | ORAL_TABLET | Freq: Two times a day (BID) | ORAL | Status: DC
  Administered 2022-03-29 – 2022-04-03 (×10): 4 mg via ORAL
  Filled 2022-03-29 (×13): qty 1

## 2022-03-29 MED ORDER — TROSPIUM CHLORIDE 20 MG PO TABS
20.0000 mg | ORAL_TABLET | ORAL | Status: DC
  Administered 2022-03-30 – 2022-04-03 (×9): 20 mg via ORAL
  Filled 2022-03-29 (×10): qty 1

## 2022-03-29 MED ORDER — POTASSIUM CHLORIDE IN NACL 40-0.9 MEQ/L-% IV SOLN
INTRAVENOUS | Status: DC
  Filled 2022-03-29 (×2): qty 1000

## 2022-03-29 MED ORDER — BACLOFEN 10 MG PO TABS
20.0000 mg | ORAL_TABLET | Freq: Three times a day (TID) | ORAL | Status: DC
  Administered 2022-03-29 – 2022-03-30 (×4): 20 mg via ORAL
  Filled 2022-03-29 (×7): qty 2

## 2022-03-29 MED ORDER — DIAZEPAM 2 MG PO TABS: 2.0000 mg | ORAL_TABLET | Freq: Three times a day (TID) | ORAL | Status: DC

## 2022-03-29 MED ORDER — DIAZEPAM 5 MG PO TABS
5.0000 mg | ORAL_TABLET | Freq: Three times a day (TID) | ORAL | Status: DC
  Administered 2022-03-29 – 2022-04-03 (×16): 5 mg via ORAL
  Filled 2022-03-29 (×19): qty 1

## 2022-03-29 NOTE — Progress Notes (Signed)
Neurology Progress Note  Brief HPI: 51 year old patient with history of secondary progressive MS was admitted to CIR on 10/17 for increased spasticity and worsening mobility.  Baclofen pump was revised in August with a 5% increase in baclofen on 03/02/2022.  While in rehab patient developed progressive weakness of left arm and legs with MRI revealing new acute lesions in the occipital region. One dose of IV Solumedrol was administered for possible MS exacerbation with no further doses given when patient was found to have a fever and elevated white blood cell count.  UA was positive for UTI and patient is being treated for this.  She was transitioned to an inpatient unit as she was not able to participate in rehabilitation.  Today, left-sided weakness is improved but white blood cell count is increased again.  Patient is on multiple sedating medications (baclofen, tizanidine, Valium, oxycodone and methocarbamol) which may be contributing to left-sided weakness and overall drowsiness. Patient states that she often feels more drowsy and tired after taking medications. Also has received cefepime, which is potentially neurotoxic.  Subjective: Patient reports that left-sided weakness has improved somewhat, but she is unable to lift her left bicep off of the bed and cannot lift her left leg off of the bed.  Patient appears alert at this time, but states that she often becomes drowsy after taking her medications.  White blood cell count is elevated at 14.5.  Exam: Vitals:   03/28/22 0423  BP: 111/73  Pulse: 75  Resp: 18  Temp: 99 F (37.2 C)  SpO2: 92%   Gen: In bed, NAD Resp: non-labored breathing, no acute distress Abd: soft, nt  Neuro: Mental Status: Alert and oriented x3 speech clear and fluent Cranial Nerves:PERRL, EOMI without nystagmus, facial sensation symmetric, smile symmetric, hearing intact to voice, voice hypophonic, shoulder shrug symmetric, tongue midline Motor: Right upper extremity  4/5, left upper extremity 3/5 but unable to lift bicep off the bed left lower extremity 1/5, right lower extremity 3/5 decreased tone on left side able to lift head off of the pillow Sensory: Symmetric to light touch throughout.  Some pain with touch noted in left arm DTR: Unable to elicit Gait: Deferred  Pertinent Labs:    Latest Ref Rng & Units 03/29/2022    4:39 AM 03/26/2022    5:06 AM 03/23/2022    5:53 AM  BMP  Glucose 70 - 99 mg/dL 106  91  91   BUN 6 - 20 mg/dL 43  19  12   Creatinine 0.44 - 1.00 mg/dL 1.25  1.20  0.87   Sodium 135 - 145 mmol/L 140  143  140   Potassium 3.5 - 5.1 mmol/L 3.4  4.0  3.9   Chloride 98 - 111 mmol/L 102  104  102   CO2 22 - 32 mmol/L 29  27  31    Calcium 8.9 - 10.3 mg/dL 9.1  9.1  9.5        Latest Ref Rng & Units 03/29/2022    4:39 AM 03/26/2022    5:06 AM 03/24/2022   11:19 AM  CBC  WBC 4.0 - 10.5 K/uL 14.5  8.0  10.1   Hemoglobin 12.0 - 15.0 g/dL 10.5  10.2  11.6   Hematocrit 36.0 - 46.0 % 31.1  31.4  35.8   Platelets 150 - 400 K/uL 292  309  341      Current Facility-Administered Medications:    0.9 %  sodium chloride infusion, 250 mL, Intravenous, PRN,  Norins, Rosalyn Gess, MD   0.9 % NaCl with KCl 40 mEq / L  infusion, , Intravenous, Continuous, Danford, Earl Lites, MD, Last Rate: 100 mL/hr at 03/29/22 1401, New Bag at 03/29/22 1401   acetaminophen (TYLENOL) tablet 650 mg, 650 mg, Oral, Q6H PRN, Norins, Rosalyn Gess, MD, 650 mg at 03/29/22 0020   amantadine (SYMMETREL) capsule 100 mg, 100 mg, Oral, TID, Norins, Rosalyn Gess, MD, 100 mg at 03/29/22 1402   antiseptic oral rinse (BIOTENE) solution 15 mL, 15 mL, Mouth Rinse, PRN, Norins, Rosalyn Gess, MD   ascorbic acid (VITAMIN C) tablet 1,000 mg, 1,000 mg, Oral, Daily, Norins, Rosalyn Gess, MD, 1,000 mg at 03/29/22 1004   baclofen (LIORESAL) intrathecal injection 40 mg/5mL, 80 mg, Intrathecal, Once, Pham, Minh Q, RPH-CPP   baclofen (LIORESAL) tablet 20 mg, 20 mg, Oral, TID, Danford, Earl Lites,  MD   buPROPion (WELLBUTRIN XL) 24 hr tablet 300 mg, 300 mg, Oral, Daily, Norins, Rosalyn Gess, MD, 300 mg at 03/29/22 1004   calcium carbonate (TUMS - dosed in mg elemental calcium) chewable tablet 200 mg of elemental calcium, 1 tablet, Oral, TID PRN, Norins, Rosalyn Gess, MD   Chlorhexidine Gluconate Cloth 2 % PADS 6 each, 6 each, Topical, Daily, Danford, Earl Lites, MD, 6 each at 03/29/22 1005   cholecalciferol (VITAMIN D3) 25 MCG (1000 UNIT) tablet 5,000 Units, 5,000 Units, Oral, Daily, Norins, Rosalyn Gess, MD, 5,000 Units at 03/29/22 1004   cyanocobalamin (VITAMIN B12) tablet 2,500 mcg, 2,500 mcg, Oral, Daily, Norins, Rosalyn Gess, MD, 2,500 mcg at 03/29/22 1004   dalfampridine TB12 10 mg, 10 mg, Oral, BID, Norins, Rosalyn Gess, MD, 10 mg at 03/29/22 0542   diazepam (VALIUM) tablet 5 mg, 5 mg, Oral, Q8H, Danford, Christopher P, MD, 5 mg at 03/29/22 1402   docusate sodium (COLACE) capsule 100 mg, 100 mg, Oral, QHS, Norins, Rosalyn Gess, MD, 100 mg at 03/28/22 2115   enoxaparin (LOVENOX) injection 40 mg, 40 mg, Subcutaneous, Q24H, Norins, Rosalyn Gess, MD, 40 mg at 03/28/22 1750   fiber (NUTRISOURCE FIBER) 1 packet, 1 packet, Oral, Daily, Calton Dach I, RPH, 1 packet at 03/29/22 1005   naproxen (NAPROSYN) tablet 500 mg, 500 mg, Oral, BID WC, Norins, Rosalyn Gess, MD, 500 mg at 03/29/22 6063   oxyCODONE (Oxy IR/ROXICODONE) immediate release tablet 10 mg, 10 mg, Oral, Q6H PRN, Norins, Rosalyn Gess, MD, 10 mg at 03/29/22 1406   piperacillin-tazobactam (ZOSYN) IVPB 3.375 g, 3.375 g, Intravenous, Q8H, Tamera Reason, RPH, Last Rate: 12.5 mL/hr at 03/29/22 1401, 3.375 g at 03/29/22 1401   polyethylene glycol (MIRALAX / GLYCOLAX) packet 17 g, 17 g, Oral, Daily, Norins, Rosalyn Gess, MD, 17 g at 03/29/22 1004   polyvinyl alcohol (LIQUIFILM TEARS) 1.4 % ophthalmic solution 1 drop, 1 drop, Both Eyes, TID PRN, Calton Dach I, RPH   potassium chloride SA (KLOR-CON M) CR tablet 20 mEq, 20 mEq, Oral, QODAY, Norins, Rosalyn Gess,  MD, 20 mEq at 03/29/22 1004   Psyllium CAPS 5 capsule, 5 capsule, Oral, Daily, Norins, Rosalyn Gess, MD, 5 capsule at 03/29/22 1004   senna-docusate (Senokot-S) tablet 3 tablet, 3 tablet, Oral, Daily, Norins, Rosalyn Gess, MD, 3 tablet at 03/29/22 1004   sodium chloride flush (NS) 0.9 % injection 3 mL, 3 mL, Intravenous, Q12H, Norins, Rosalyn Gess, MD, 3 mL at 03/29/22 1005   sodium chloride flush (NS) 0.9 % injection 3 mL, 3 mL, Intravenous, PRN, Norins, Rosalyn Gess, MD   tiZANidine (ZANAFLEX) tablet 4 mg, 4  mg, Oral, BID, Danford, Earl Lites, MD   [START ON 03/30/2022] trospium (SANCTURA) tablet 20 mg, 20 mg, Oral, 2 times per day, Danford, Earl Lites, MD   Imaging Reviewed:  MRI brain:  Extensive T2/FLAIR hyperintense periventricular and subcortical lesions which have a morphology that is consistent with a demyelinating process. There is a focal contrast-enhancing lesion in the left occipital lobe, which could suggest active demyelination.  However, this area is very small, does not explain patient's symptoms and may be artifactual  Assessment: 51 year old female with a history of secondary progressive MS who presented initially to inpatient rehab and was transitioned to inpatient unit as she was unable to participate in rehab due to increased left-sided weakness and flaccidity.  Patient was discovered to have a UTI for which she is being treated.   - MRI shows a faint punctate enhancing lesion on a background of lesions consistent with MS, but lesion does not explain patient's current symptoms of left-sided weakness.   - Patient is on multiple sedating medications which may explain her overall drowsiness, especially as patient states that she often gets drowsy after taking her medicines.  Patient is taking methocarbamol, diazepam, tizanidine, oxycodone and baclofen at fairly large doses.   - White blood cell count is increased today so would recommend optimizing antibiotics.  Impression: Likely  infection causing worsening of pre-existing deficits, as well as oversedation from multiple sedating medications  Recommendations: 1) optimize antibiotics due to increasing white cell count 2) decrease sedating medications as much as possible 3) would hold steroids for now as this appears to be a pseudoexacerbation caused by oversedation from medications in conjunction with infection  Cortney E Ernestina Columbia , MSN, AGACNP-BC [facility] See Amion for schedule and pager information 03/29/2022 8:33 AM   Electronically signed: Dr. Caryl Pina

## 2022-03-29 NOTE — Progress Notes (Signed)
Progress Note   Sharon Odom: Sharon Odom GBT:[id number] DOB: Oct 11, 1970 DOA: 03/27/2022     2 DOS: the Sharon Odom was seen and examined on 03/29/2022 at 9:58 AM      Brief hospital course: Sharon Odom is a 51 y.o. F with relapsing progressive MS, hx baclofen pump infection Nov 2022, treated with extended IV antibiotics, now pump recently replaced (May 2023), history of thrombocytopenia from [place], anxiety, asthma and chronic pain who was recently admitted to inpatient rehab from home for gradually worsening physical status at home who had progressive weakness in the hospital and then fever.    Urine culture grew Klebsiella and Enterobacter for which she was Started on antibiotics and MRI showed new demyelinating lesions, so she was started on Solu-medrol and admitted to acute care for Neurology evaluation.     10/17: Admitted to CIR 10/27: Fever, much more weak, encephalopathic, globally weak, nitrofurantoin started 10/29: Urine culture with Enterobacter, Klebsiella, changed to cefepime 10/30: MRI brain shows new lesions, Solu-medrol started 10/31: Transferred to Acute Care, Neuro consulted, Solu-medrol stopped 11/1: Sedating medicines titrated down 11/2: Some clinical improvement     Assessment and Plan: * Acute metabolic encephalopathy Discussed with Dr. Berline Chough on 11/1.  She had observed gradual clinical decline in Sharon Odom's function as described below by husband, and this was the reason for admitting to CIR.  She observed however, an acute change in mentation during week two in CIR, in the context of new methadone, titrating up baclofen pump plus oral baclofen, high doses of tizanidine and Valium, as well as fever to 101 on Oct 28.  An MRI on Oct 30 showed possible new demyelinating lesions.  Here, she was evaluated by Neurology who felt MS was NOT the cause of her symptoms and the possible demyelinating lesion on imaging was likely incidental.  I suspect this is from polypharmacy  mostly, possibly infection (urine; blood cultures negative and no symptoms/signs of pump re-infection)  Methadone and Periactin stopped yesterday, and Valium, baclofen p.o., and tizanidine all titrated down --> today Sharon Odom appears more alert and stronger - Continue IV antibiotics - Continue IV fluids - Continue titrating down psychoactive drugs    Relapsing remitting multiple sclerosis (HCC) - Consult palliative care - Continue dalfampridine - Continue baclofen pump - Continue oral baclofen and tizanidine at lower doses - Stop cyproheptadine and Robaxin - Continue amantadine - Continue fesoterodine  Failure to thrive in adult Underlying the acute change in the last week, husband and PM&R have noted a slow progressive decline in strength and ability to transfer over months.     UTI (urinary tract infection) with pyuria Due to chronic indwelling Foley.  Urine culture from Foley growing Enterobacter and Klebsiella.  She has tolerated cefepime in the past, although in the setting of encephalopathy agree with transitioning to Zosyn. - Continue Zosyn day 3 of 5  Normocytic anemia Hemoglobin stable relative to baseline  Steroid-induced hyperglycemia A1c normal.  Resolved. - Stop insulin  Presence of intrathecal baclofen pump No redness or swelling to suggest infection  Depression with anxiety - Continue Valium at lower dose - Continue bupropion          Subjective: Sharon Odom stronger today, husband relates to me that she texted him this morning, which is typical for her, but that she had not been able to do for for 5 days prior to that.  No fever, no vomiting.  Still  "weak", appetite improved.     Physical Exam: BP 111/73 (BP Location: Left Arm)  Pulse 75   Temp 99 F (37.2 C) (Oral)   Resp 18   SpO2 92%   Thin adult female, lying in bed, sitting up with someone breakfast in front of her, weak but more alert than yesterday RRR, no murmurs, no peripheral  edema Respiratory rate normal, lungs clear without rales or wheezes Abdomen soft, no guarding, no rigidity or rebound Attention more alert, affect improved, but still overall dulled, left-sided weakness is noted, but right arm strength seems improved  Data Reviewed: Glucose normal Potassium slightly down Creatinine 1.2 and BUN elevated White blood cell count up to 14 Hemoglobin 10, no change  Family Communication: Husband by phone    Disposition: Status is: Inpatient Sharon Odom still with metabolic encphaloapthy from polypharmacy, UTI requiring IV antibiotics.  Will consult PT today, possibly be ready to transition back to CIR in 1-2 days        Author: Edwin Dada, MD 03/29/2022 1:15 PM  For on call review [email or web address].

## 2022-03-29 NOTE — Progress Notes (Signed)
Pharmacy Antibiotic Note  Sharon Odom is a 51 y.o. female  with h/o intermittent relapsing MS.,  admitted on 03/27/2022  from inpatient rehab back to [facility] acute care  secondary progressive multiple sclerosis . She was initially admitted to CIR on 10/17 for increased spasticity and worsening mobility and titration of IC baclofen pump for spasticity.  [name] she had rapidly progressive weakness affecting her left arm and leg to the point of flaccidity on the left in the last 24-48 hrs of rehab stay. MRI revealed new acute lesions in the occipital region.  She was also on Cefepime for UTI.   Patient was discharged from inpatient rehab and admitted to [facility] on 03/27/22.   Neurology recommended change from Cefepime to Zosyn due to the risk of neurotoxicity.   Pharmacy consulted on 03/28/22  for Zosyn dosing for UTI - klebsiella pneu and enterobacter cloacae,   both sens to zosyn.    WBC up to 14.5.   afebrile,  SCr  increased to 1.25,  CrCl 51 ml/min  Plan: Zosyn 3.375 g IV q8h (EI  4hour infusion of each dose) Monitor clinical status, renal function and culture results daily.     Temp (24hrs), Avg:97.7 F (36.5 C), Min:97.4 F (36.3 C), Max:98.1 F (36.7 C)  Recent Labs  Lab 03/23/22 0553 03/24/22 1119 03/24/22 1335 03/24/22 1620 03/26/22 0506 03/29/22 0439  WBC  --  10.1  --   --  8.0 14.5*  CREATININE 0.87  --   --   --  1.20* 1.25*  LATICACIDVEN  --   --  1.6 1.1  --   --     Estimated Creatinine Clearance: 51.3 mL/min (A) (by C-G formula based on SCr of 1.25 mg/dL (H)).    Allergies  Allergen Reactions   Ziconotide Acetate Shortness Of Breath    Anorexia, weird sensations, could not talk, choking feeling, lost since of taste and smell. Hallucinations, vivid dreams. Confusion and sleepiness. N/v, increase in pain.  Anorexia, weird sensations, could not talk, choking feeling, lost since of taste and smell. Hallucinations, vivid dreams. Confusion and sleepiness. N/v,  increase in pain.    Morphine Other (See Comments)    Pt does not recall reaction    Amoxicillin Nausea And Vomiting   Dantrolene Other (See Comments)    Muscle pain   Lamotrigine Other (See Comments)    Join pain   Lyrica [Pregabalin] Nausea And Vomiting  Thank you for allowing pharmacy to be a part of this patient's care.  Thank you for allowing pharmacy to be part of this patients care team. Nicole Cella, RPh Clinical Pharmacist  03/29/2022 11:56 AM

## 2022-03-29 NOTE — Progress Notes (Signed)
Inpatient Rehabilitation Admissions Coordinator   Noted medical plans as outlined. I await PT and OT to be ordered and assessed so that I could pursue insurance approval with [employer] for possible readmit to CIR.  Danne Baxter, RN, MSN Rehab Admissions Coordinator [phone] 03/29/2022 1:35 PM

## 2022-03-30 DIAGNOSIS — Z515 Encounter for palliative care: Secondary | ICD-10-CM

## 2022-03-30 LAB — BASIC METABOLIC PANEL
Anion gap: 9 (ref 5–15)
BUN: 24 mg/dL — ABNORMAL HIGH (ref 6–20)
CO2: 27 mmol/L (ref 22–32)
Calcium: 8.9 mg/dL (ref 8.9–10.3)
Chloride: 109 mmol/L (ref 98–111)
Creatinine, Ser: 0.77 mg/dL (ref 0.44–1.00)
GFR, Estimated: >60 mL/min (ref >60)
Glucose, Bld: 96 mg/dL (ref 70–99)
Potassium: 4 mmol/L (ref 3.5–5.1)
Sodium: 145 mmol/L (ref 135–145)

## 2022-03-30 LAB — CBC
HCT: 31.1 % — ABNORMAL LOW (ref 36.0–46.0)
Hemoglobin: 10.1 g/dL — ABNORMAL LOW (ref 12.0–15.0)
MCH: 31.7 pg (ref 26.0–34.0)
MCHC: 32.5 g/dL (ref 30.0–36.0)
MCV: 97.5 fL (ref 80.0–100.0)
Platelets: 261 10*3/uL (ref 150–400)
RBC: 3.19 MIL/uL — ABNORMAL LOW (ref 3.87–5.11)
RDW: 12 % (ref 11.5–15.5)
WBC: 10.1 10*3/uL (ref 4.0–10.5)
nRBC: 0 % (ref 0.0–0.2)

## 2022-03-30 LAB — GLUCOSE, CAPILLARY
Glucose-Capillary: 96 mg/dL (ref 70–99)
Glucose-Capillary: 116 mg/dL — ABNORMAL HIGH (ref 70–99)
Glucose-Capillary: 117 mg/dL — ABNORMAL HIGH (ref 70–99)

## 2022-03-30 MED ORDER — OXYCODONE HCL 5 MG PO TABS
10.0000 mg | ORAL_TABLET | ORAL | Status: DC | PRN
  Administered 2022-03-30 – 2022-04-03 (×21): 10 mg via ORAL
  Filled 2022-03-30 (×21): qty 2

## 2022-03-30 NOTE — Plan of Care (Signed)
  Problem: Education: Goal: Knowledge of General Education information will improve Description: Including pain rating scale, medication(s)/side effects and non-pharmacologic comfort measures Outcome: Progressing   Problem: Nutrition: Goal: Adequate nutrition will be maintained Outcome: Progressing   Problem: Safety: Goal: Ability to remain free from injury will improve Outcome: Progressing   

## 2022-03-30 NOTE — TOC Progression Note (Signed)
Transition of Care [facility]) - Progression Note    Patient Details  Name: Sharon Odom MRN: [id number] Date of Birth: 1970/10/08  Transition [facility]) CM/SW [place], RN Phone Number: 03/30/2022, 3:11 PM  Clinical Narrative:    CIR is following the patient at this time for possible readmission.  TOC will continue to follow for TOC needs.   Expected Discharge Plan: IP Rehab Facility Barriers to Discharge: Continued Medical Work up  Expected Discharge Plan and Services Expected Discharge Plan: [place]       Living arrangements for the past 2 months: [place] Determinants of Health (SDOH) Interventions    Readmission Risk Interventions    03/30/2022    8:24 AM  Readmission Risk Prevention Plan  Transportation Screening Complete  PCP or Specialist Appt within 5-7 Days Complete  Home Care Screening Complete  Medication Review (RN CM) Complete

## 2022-03-30 NOTE — Evaluation (Signed)
Physical Therapy Evaluation Patient Details Name: Sharon Odom MRN: [id number] DOB: August 05, 1970 Today's Date: 03/30/2022  History of Present Illness  51 y.o. F with relapsing progressive MS, baclofen pump recently replaced (May 2023), history of thrombocytopenia, anxiety, asthma and chronic pain who admitted to inpatient rehab from home 10/17. Admitted to acute care services 10/31 for failure to thrive and UTI.  Clinical Impression  Pt admitted with above diagnosis. Required mod assist +2 for mobility today including supine>sit, squat pivot transfer to [facility], and stand pivot transfer from [facility] to power w/c. Cues throughout for technique, safety and awareness. Shows reduced trunk control and requires CGA with intermittent assistance to stabilize trunk spontaneously at times. Pt eager to mobilize with therapy today. Unsuccessful attempt of BM on BSC. Pt currently with functional limitations due to the deficits listed below (see PT Problem List). Pt will benefit from skilled PT to increase their independence and safety with mobility to allow discharge to the venue listed below.          Recommendations for follow up therapy are one component of a multi-disciplinary discharge planning process, led by the attending physician.  Recommendations may be updated based on patient status, additional functional criteria and insurance authorization.  Follow Up Recommendations Acute inpatient rehab (3hours/day)      Assistance Recommended at Discharge Frequent or constant Supervision/Assistance  Patient can return home with the following  Two people to help with walking and/or transfers;Two people to help with bathing/dressing/bathroom;Assistance with cooking/housework;Assist for transportation;Help with stairs or ramp for entrance    Equipment Recommendations None recommended by PT  Recommendations for Other Services       Functional Status Assessment Patient has had a recent decline in their functional  status and demonstrates the ability to make significant improvements in function in a reasonable and predictable amount of time.     Precautions / Restrictions Precautions Precautions: Fall Required Braces or Orthoses: Other Brace Other Brace: Hinge brace for Lt knee Restrictions Weight Bearing Restrictions: No      Mobility  Bed Mobility Overal bed mobility: Needs Assistance Bed Mobility: Supine to Sit     Supine to sit: Mod assist, +2 for physical assistance, HOB elevated     General bed mobility comments: Mod assist +2 for bed mobility, primiarly for trunk control, some assist for LLE to EOB and scooting. Able to use RUE and RLE to assist with transition to EOB.    Transfers Overall transfer level: Needs assistance Equipment used: Rolling walker (2 wheels), None Transfers: Sit to/from Stand, Bed to chair/wheelchair/BSC Sit to Stand: Mod assist, +2 physical assistance     Squat pivot transfers: Mod assist, +2 safety/equipment     General transfer comment: Mod assist +2 performed squat pivot transfer towards Rt from bed to [facility], pt showing good push through RLE, and pulling self to chair through RUE on arm rest of BSC. Mod assist +2 for sit<>stand and pivot transfer from [facility] to power chair, again with good power up from RLE, however needs Lt knee block and to assist with activating locking mechanism for Lt knee hinge brace. Requires assist to turn RW and to bring LLE back prior to sitting, trunk fairly flexed throughout but more stable with use of RW for support.    Ambulation/Gait                  Stairs            Wheelchair Mobility    Modified Rankin (Stroke Patients  Only)       Balance Overall balance assessment: Needs assistance Sitting-balance support: Single extremity supported, Feet supported Sitting balance-Leahy Scale: Poor (Requires CGA majority/full duration of seated balance due to spontaneous instability of trunk)     Standing balance  support: Bilateral upper extremity supported, During functional activity, Reliant on assistive device for balance Standing balance-Leahy Scale: Poor                               Pertinent Vitals/Pain Pain Assessment Pain Assessment: No/denies pain Pain Intervention(s): Monitored during session    Home Living Family/patient expects to be discharged to:: Inpatient rehab Living Arrangements: Spouse/significant other Available Help at Discharge: Family;Available PRN/intermittently Type of Home: House Home Access: Other (comment) (vertical platform lift to enter home)     Alternate Level Stairs-Number of Steps: 16-18 Home Layout: Two level;Able to live on main level with bedroom/bathroom   Additional Comments: Previously walking with RW until July    Prior Function Prior Level of Function : Needs assist       Physical Assist : Mobility (physical);ADLs (physical) Mobility (physical): Bed mobility;Transfers;Gait (Was walking with RW in house until July -per pt report, had a significant decline in function after and has been working with [facility] rehab. Since 10/17)   Mobility Comments: Uses power w/c. Was walking with RW in house until July -per pt report, had a significant decline in function after and has been working with [facility] rehab. Since 10/17       Hand Dominance   Dominant Hand: Right    Extremity/Trunk Assessment   Upper Extremity Assessment Upper Extremity Assessment: Defer to OT evaluation    Lower Extremity Assessment Lower Extremity Assessment: Generalized weakness;LLE deficits/detail LLE Deficits / Details: trace movement throughout LLE with functional assessment, not formally assessed with individual MMT today.       Communication   Communication: Other (comment);Expressive difficulties (Dysphonia)  Cognition Arousal/Alertness: Awake/alert Behavior During Therapy: WFL for tasks assessed/performed Overall Cognitive Status: Within Functional Limits for  tasks assessed                                 General Comments: Aware of safety and w/c use, able to verbalize understanding.        General Comments General comments (skin integrity, edema, etc.): Toileting and peri-care assist provided by OT for patient modesty per pt request.    Exercises     Assessment/Plan    PT Assessment Patient needs continued PT services  PT Problem List Decreased strength;Decreased range of motion;Decreased activity tolerance;Decreased balance;Decreased mobility;Decreased coordination;Decreased knowledge of use of DME;Impaired sensation;Impaired tone       PT Treatment Interventions DME instruction;Gait training;Functional mobility training;Therapeutic activities;Therapeutic exercise;Balance training;Neuromuscular re-education;Patient/family education;Wheelchair mobility training    PT Goals (Current goals can be found in the Care Plan section)  Acute Rehab PT Goals Patient Stated Goal: Get back to rehab and go home with more independence as soon as possible. PT Goal Formulation: With patient Time For Goal Achievement: 04/13/22 Potential to Achieve Goals: Good    Frequency Min 4X/week     Co-evaluation PT/OT/SLP Co-Evaluation/Treatment: Yes Reason for Co-Treatment: Complexity of the patient's impairments (multi-system involvement);For patient/therapist safety;To address functional/ADL transfers PT goals addressed during session: Mobility/safety with mobility;Balance;Proper use of DME         AM-PAC PT "6 Clicks" Mobility  Outcome Measure Help needed  turning from your back to your side while in a flat bed without using bedrails?: Total Help needed moving from lying on your back to sitting on the side of a flat bed without using bedrails?: Total Help needed moving to and from a bed to a chair (including a wheelchair)?: Total Help needed standing up from a chair using your arms (e.g., wheelchair or bedside chair)?: Total Help  needed to walk in hospital room?: Total Help needed climbing 3-5 steps with a railing? : Total 6 Click Score: 6    End of Session Equipment Utilized During Treatment: Other (comment) (Lt knee hinge brace) Activity Tolerance: Patient tolerated treatment well Patient left: with call bell/phone within reach;Other (comment) (In power w/c with power off) Nurse Communication: Mobility status PT Visit Diagnosis: Unsteadiness on feet (R26.81);Muscle weakness (generalized) (M62.81);Difficulty in walking, not elsewhere classified (R26.2);Other symptoms and signs involving the nervous system (R29.898);Adult, failure to thrive (R62.7);Hemiplegia and hemiparesis Hemiplegia - Right/Left: Left Hemiplegia - dominant/non-dominant: Non-dominant Hemiplegia - caused by: Unspecified    Time: [id number] PT Time Calculation (min) (ACUTE ONLY): 47 min   Charges:   PT Evaluation $PT Eval Moderate Complexity: 1 Mod          Candie Mile, PT, DPT Physical Therapist Acute Rehabilitation Services [place] 03/30/2022, 10:59 AM

## 2022-03-30 NOTE — Evaluation (Signed)
Occupational Therapy Evaluation Patient Details Name: Sharon Odom MRN: [id number] DOB: 1971-02-12 Today's Date: 03/30/2022   History of Present Illness 51 y.o. F with relapsing progressive MS, baclofen pump recently replaced (May 2023), history of thrombocytopenia, anxiety, asthma and chronic pain who admitted to inpatient rehab from home 10/17. Admitted to acute care services 10/31 for failure to thrive and UTI.   Clinical Impression   Pt admitted with the above diagnosis and has the deficits outlined below. Pt would benefit from cont OT to increase independence with basic adls and adl transfers so she can eventually return back home with husband after rehab stay and do for herself. Pt with new weakness in L side which makes adls more difficult and pt's husband works during the day.  Feel AIR may be best option to maximize this pt's independence so she can return to her previous level of functioning. Will continue to see with focus on adls and functional mobility.      Recommendations for follow up therapy are one component of a multi-disciplinary discharge planning process, led by the attending physician.  Recommendations may be updated based on patient status, additional functional criteria and insurance authorization.   Follow Up Recommendations  Acute inpatient rehab (3hours/day)    Assistance Recommended at Discharge Frequent or constant Supervision/Assistance  Patient can return home with the following A lot of help with walking and/or transfers;A lot of help with bathing/dressing/bathroom;Assistance with cooking/housework;Direct supervision/assist for financial management;Assist for transportation;Help with stairs or ramp for entrance    Functional Status Assessment  Patient has had a recent decline in their functional status and demonstrates the ability to make significant improvements in function in a reasonable and predictable amount of time.  Equipment Recommendations  None  recommended by OT    Recommendations for Other Services Rehab consult     Precautions / Restrictions Precautions Precautions: Fall Required Braces or Orthoses: Other Brace Other Brace: Hinge brace for Lt knee Restrictions Weight Bearing Restrictions: No      Mobility Bed Mobility Overal bed mobility: Needs Assistance Bed Mobility: Supine to Sit     Supine to sit: Mod assist     General bed mobility comments: used bedrails and HOB at 40 degrees    Transfers Overall transfer level: Needs assistance Equipment used: Rolling walker (2 wheels) Transfers: Sit to/from Stand, Bed to chair/wheelchair/BSC Sit to Stand: Mod assist Stand pivot transfers: Mod assist, +2 physical assistance         General transfer comment: Pt required cues for hand placement, to stand straight to lock L knee brace and cues to use the RUE to assist to push to stand.      Balance Overall balance assessment: Needs assistance Sitting-balance support: Single extremity supported, Feet supported Sitting balance-Leahy Scale: Poor Sitting balance - Comments: falling to R Postural control: Right lateral lean Standing balance support: Bilateral upper extremity supported, During functional activity, Reliant on assistive device for balance Standing balance-Leahy Scale: Poor Standing balance comment: +2 assist given. Required someone else to lock out knee brace.                           ADL either performed or assessed with clinical judgement   ADL Overall ADL's : Needs assistance/impaired Eating/Feeding: Set up;Sitting   Grooming: Wash/dry hands;Wash/dry face;Oral care;Sitting;Minimal assistance   Upper Body Bathing: Minimal assistance;Sitting   Lower Body Bathing: Maximal assistance;Sit to/from stand;+2 for physical assistance   Upper Body Dressing : Moderate  assistance;Sitting   Lower Body Dressing: Maximal assistance;+2 for physical assistance;Sit to/from stand   Toilet Transfer:  Moderate assistance;+2 for physical assistance;Stand-pivot;BSC/3in1;Standard walker Toilet Transfer Details (indicate cue type and reason): used knee brace Toileting- Clothing Manipulation and Hygiene: Maximal assistance;Sit to/from stand       Functional mobility during ADLs: Moderate assistance;+2 for physical assistance (transfers) General ADL Comments: Pt limited with adls due to L side weakness, poor sitting balance and inability to stand without signficant assist.     Vision Baseline Vision/History: 1 Wears glasses Ability to See in Adequate Light: 0 Adequate Patient Visual Report: No change from baseline Vision Assessment?: No apparent visual deficits     Perception     Praxis      Pertinent Vitals/Pain Pain Assessment Pain Assessment: No/denies pain     Hand Dominance Right   Extremity/Trunk Assessment Upper Extremity Assessment Upper Extremity Assessment: LUE deficits/detail LUE Deficits / Details: Shoulder 2/5, biceps/triceps 3/5, grip 3+/5, finger extension 3/5. LUE Sensation: decreased light touch LUE Coordination: decreased fine motor;decreased gross motor   Lower Extremity Assessment Lower Extremity Assessment: Defer to PT evaluation LLE Deficits / Details: trace movement throughout LLE with functional assessment, not formally assessed with individual MMT today.   Cervical / Trunk Assessment Cervical / Trunk Assessment: Other exceptions (scoliotic) Cervical / Trunk Exceptions: scoliotic   Communication Communication Communication: Other (comment) (dysarthric)   Cognition Arousal/Alertness: Awake/alert Behavior During Therapy: WFL for tasks assessed/performed Overall Cognitive Status: History of cognitive impairments - at baseline                                 General Comments: Pt very inconsistent with answers about when she was last able to walk without physical assist etc.  Memory deficits, attn deficits and initiation impaired.      General Comments  Pt very limited by L side weakness, poor sitting and standing balance and mild memory deficits.    Exercises     Shoulder Instructions      Home Living Family/patient expects to be discharged to:: Private residence Living Arrangements: Spouse/significant other Available Help at Discharge: Family;Available PRN/intermittently Type of Home: House Home Access: Other (comment) (VPL)     Home Layout: Two level;Able to live on main level with bedroom/bathroom Alternate Level Stairs-Number of Steps: 16-18   Bathroom Shower/Tub: Other (comment) (roll in shower)   Bathroom Toilet: Handicapped height Bathroom Accessibility: Yes How Accessible: Accessible via wheelchair;Accessible via walker Home Equipment: Grab bars - tub/shower;Grab bars - toilet;Rolling Walker (2 wheels);Rollator (4 wheels);Wheelchair - manual;Shower seat - built in;Cane - single point   Additional Comments: was getting HHPT prior to inital admssion  Lives With: Spouse    Prior Functioning/Environment Prior Level of Function : Needs assist       Physical Assist : Mobility (physical);ADLs (physical) Mobility (physical): Gait;Stairs ADLs (physical): Bathing;Dressing;Toileting;IADLs Mobility Comments: Pt transferred self; walking with assistive device without issues early in summer but not since then. ADLs Comments: independent with most adls at w/c level but started requiring more assist right before admission.        OT Problem List: Decreased strength;Decreased range of motion;Impaired balance (sitting and/or standing);Decreased coordination;Decreased cognition;Decreased safety awareness;Decreased knowledge of use of DME or AE;Decreased knowledge of precautions;Impaired sensation;Impaired tone;Impaired UE functional use      OT Treatment/Interventions: Self-care/ADL training;DME and/or AE instruction;Therapeutic activities;Therapeutic exercise;Patient/family education;Balance training    OT  Goals(Current goals can be found in the  care plan section) Acute Rehab OT Goals Patient Stated Goal: to be able to do for myself again OT Goal Formulation: With patient Time For Goal Achievement: 04/13/22 Potential to Achieve Goals: Good ADL Goals Pt Will Perform Upper Body Bathing: with set-up;sitting Pt Will Perform Lower Body Bathing: with mod assist;sit to/from stand Pt Will Perform Upper Body Dressing: with set-up;sitting Pt Will Perform Lower Body Dressing: with mod assist;sit to/from stand Pt Will Transfer to Toilet: with min assist;bedside commode Pt Will Perform Toileting - Clothing Manipulation and hygiene: sitting/lateral leans;with min assist  OT Frequency: Min 2X/week    Co-evaluation PT/OT/SLP Co-Evaluation/Treatment: Yes Reason for Co-Treatment: Complexity of the patient's impairments (multi-system involvement);For patient/therapist safety;To address functional/ADL transfers PT goals addressed during session: Mobility/safety with mobility OT goals addressed during session: ADL's and self-care      AM-PAC OT "6 Clicks" Daily Activity     Outcome Measure Help from another person eating meals?: A Little Help from another person taking care of personal grooming?: A Little Help from another person toileting, which includes using toliet, bedpan, or urinal?: A Lot Help from another person bathing (including washing, rinsing, drying)?: A Lot Help from another person to put on and taking off regular upper body clothing?: A Lot Help from another person to put on and taking off regular lower body clothing?: A Lot 6 Click Score: 14   End of Session Equipment Utilized During Treatment: Rolling walker (2 wheels);Other (comment) (knee brace and power w/c) Nurse Communication: Mobility status  Activity Tolerance: Patient tolerated treatment well Patient left: in chair;with call bell/phone within reach  OT Visit Diagnosis: Unsteadiness on feet (R26.81);Other symptoms and signs  involving the nervous system (R29.898)                Time: [id number] OT Time Calculation (min): 47 min Charges:  OT General Charges $OT Visit: 1 Visit OT Evaluation $OT Eval Moderate Complexity: 1 Mod  Glenford Peers 03/30/2022, 11:02 AM

## 2022-03-30 NOTE — Progress Notes (Signed)
  Progress Note   Patient: Sharon Odom [id number] DOB: 1971/05/05 DOA: 03/27/2022     3 DOS: the patient was seen and examined on 03/30/2022 at 9:15AM      Brief hospital course: Mrs. Mohar is a 51 y.o. F with relapsing progressive MS, hx baclofen pump infection Nov 2022, treated with extended IV antibiotics, now pump recently replaced (May 2023), history of thrombocytopenia from [place], anxiety, asthma and chronic pain who was recently admitted to inpatient rehab from home for gradually worsening physical status at home who had progressive weakness in the hospital and then fever.    Urine culture grew Klebsiella and Enterobacter for which she was Started on antibiotics and MRI showed new demyelinating lesions, so she was started on Solu-medrol and admitted to acute care for Neurology evaluation.     10/17: Admitted to CIR 10/27: Fever, much more weak, encephalopathic, globally weak, nitrofurantoin started 10/29: Urine culture with Enterobacter, Klebsiella, changed to cefepime 10/30: MRI brain shows new lesions, Solu-medrol started 10/31: Transferred to Acute Care, Neuro consulted, Solu-medrol stopped 11/1: Sedating medicines titrated down 11/2: Some clinical improvement 11/3: Improved back to level when she entered inpatient rehab for treatment     Assessment and Plan: * Acute metabolic encephalopathy Resolved.  Please see longer summary from 11/2 - Stop fluids - Complete course of antibiotics - Stop cyproheptadine - Please exercise caution with resuming methadone - Please exercise caution with increasing Valium, baclofen, or tizanidine      Relapsing remitting multiple sclerosis (HCC) - Continue dalfampridine and baclofen pump, without changes - Continue amantadine and fesoterodine - Resume therapy, stable for transfer back to inpatient rehab as soon as approved  Failure to thrive in adult Overall decline over months.  Given challenges with managing spasticity and  pain in the last 2 weeks, I have concern that her symptom burden is growing and may not yield to medical treatments. - Consult palliative care       UTI (urinary tract infection) with pyuria - Continue Zosyn day 4 of 5  Normocytic anemia Stable  Steroid-induced hyperglycemia A1c normal.  Resolved.  Presence of intrathecal baclofen pump No redness or swelling to suggest infection  Depression with anxiety - Continue bupropion          Subjective: Patient has increasing generalized pain, she has difficulty stating where this is located, mostly all over.  Appetite is good, mentation is good.  No fever.  No pain, redness, or swelling near her baclofen pump p     Physical Exam: BP 110/77 (BP Location: Right Arm)   Pulse 68   Temp 98.3 F (36.8 C) (Oral)   Resp 15   SpO2 100%   Thin adult female, lying in bed, eating breakfast RRR, no murmurs, no peripheral edema, no JVD Respiratory rate normal, lungs clear without rales or wheezes Abdomen soft, baclofen pump in place, no redness or pain with palpation of the pump, no rigidity or rebound in the abdomen Attention alert, affect normal, voice less hypophonic, left-sided weakness noted, leg spasticity bilaterally    Data Reviewed: Hemoglobin 10, no change, white blood cell count normalized Potassium normalized, creatinine down to 0.7  Family Communication: Husband by phone    Disposition: Status is: Inpatient Stable for transfer back to CIR        Author: Edwin Dada, MD 03/30/2022 1:46 PM  For on call review [email or web address].

## 2022-03-30 NOTE — Consult Note (Signed)
Consultation Note Date: 03/30/2022 at 1120  Patient Name: Sharon Odom  DOB: 09-Dec-1970  MRN: [id number]  Age / Sex: 51 y.o., female  PCP: Oren Section, NP-C Referring Physician: Edwin Dada, *  Reason for Consultation: {Reason for Consult:23484}  HPI/Patient Profile: 51 y.o. female  with past medical history of *** admitted on 03/27/2022 with ***.   Clinical Assessment and Goals of Care: I have reviewed medical records including EPIC notes, labs and imaging, assessed the patient and then met with ***  to discuss diagnosis prognosis, GOC, EOL wishes, disposition and options.  I introduced Palliative Medicine as specialized medical care for people living with serious illness. It focuses on providing relief from the symptoms and stress of a serious illness. The goal is to improve quality of life for both the patient and the family.  We discussed a brief life review of the patient.   As far as functional and nutritional status   We discussed patient's current illness and what it means in the larger context of patient's on-going co-morbidities.  Natural disease trajectory and expectations at EOL were discussed.  I attempted to elicit values and goals of care important to the patient.    The difference between aggressive medical intervention and comfort care was considered in light of the patient's goals of care.   Advance directives, concepts specific to code status, artificial feeding and hydration, and rehospitalization were considered and discussed.  Education offered regarding concept specific to human mortality and the limitations of medical interventions to prolong life when the body begins to fail to thrive.  Family is facing treatment option decisions, advanced directive, and anticipatory care needs.    Discussed with patient/family the importance of continued conversation with  family and the medical providers regarding overall plan of care and treatment options, ensuring decisions are within the context of the patient's values and GOCs.    Hospice and Palliative Care services outpatient were explained and offered.  Questions and concerns were addressed. The family was encouraged to call with questions or concerns.   Primary Decision Maker {Primary Decision IPPND:58316}  Physical Exam  Palliative Assessment/Data:     Thank you for this consult. Palliative medicine will continue to follow and assist holistically.   Time Total: *** minutes Greater than 50%  of this time was spent counseling and coordinating care related to the above assessment and plan.  Signed by: Jordan Hawks, DNP, FNP-BC Palliative Medicine    Please contact Palliative Medicine Team phone at [phone] for questions and concerns.  For individual provider: See Shea Evans

## 2022-03-30 NOTE — Progress Notes (Signed)
Inpatient Rehabilitation Admissions Coordinator   I will begin insurance Auth for possible readmit to CIR pending approval by [place].   Danne Baxter, RN, MSN Rehab Admissions Coordinator [phone] 03/30/2022 2:17 PM

## 2022-03-31 DIAGNOSIS — Z789 Other specified health status: Secondary | ICD-10-CM

## 2022-03-31 LAB — GLUCOSE, CAPILLARY: Glucose-Capillary: 91 mg/dL (ref 70–99)

## 2022-03-31 MED ORDER — BACLOFEN 10 MG PO TABS
20.0000 mg | ORAL_TABLET | Freq: Three times a day (TID) | ORAL | Status: DC
  Administered 2022-03-31 – 2022-04-01 (×4): 20 mg via ORAL
  Filled 2022-03-31 (×4): qty 2

## 2022-03-31 MED ORDER — BISACODYL 10 MG RE SUPP: 10.0000 mg | Freq: Every day | RECTAL | Status: DC | PRN

## 2022-03-31 NOTE — Progress Notes (Signed)
Physical Therapy Treatment Patient Details Name: Sharon Odom MRN: [id number] DOB: August 03, 1970 Today's Date: 03/31/2022   History of Present Illness 51 y.o. F with relapsing progressive MS, baclofen pump recently replaced (May 2023), history of thrombocytopenia, anxiety, asthma and chronic pain who admitted to inpatient rehab from home 10/17. Admitted to acute care services 10/31 for failure to thrive and UTI.    PT Comments    Pt seated in power WC on arrival.  Pt motivated to practice her mobility this session.  Performed sit to stand x 3 trials with cues for hand placement to and from seated surface and emphasis on L knee and hip extension.  Able to grip RW with L hand this session.  Continue to recommend rehab in a post acute setting.      Recommendations for follow up therapy are one component of a multi-disciplinary discharge planning process, led by the attending physician.  Recommendations may be updated based on patient status, additional functional criteria and insurance authorization.  Follow Up Recommendations  Acute inpatient rehab (3hours/day)     Assistance Recommended at Discharge Frequent or constant Supervision/Assistance  Patient can return home with the following Two people to help with walking and/or transfers;Two people to help with bathing/dressing/bathroom;Assistance with cooking/housework;Assist for transportation;Help with stairs or ramp for entrance   Equipment Recommendations  None recommended by PT    Recommendations for Other Services       Precautions / Restrictions Precautions Precautions: Fall Required Braces or Orthoses: Other Brace Other Brace: Hinge brace for Lt knee Restrictions Weight Bearing Restrictions: No     Mobility  Bed Mobility               General bed mobility comments: Pt seated in power WC on arrival.    Transfers Overall transfer level: Needs assistance Equipment used: Rolling walker (2 wheels) Transfers: Sit to/from  Stand    Min-mod assistance +1.           General transfer comment: Cues for hand placement to and from seated surface.  Assistance to block L knee and extend L hip.  Performed sit to stand x 3 with emphasis on posture and L knee/hip extension.    Ambulation/Gait                   Stairs             Wheelchair Mobility    Modified Rankin (Stroke Patients Only)       Balance Overall balance assessment: Needs assistance   Sitting balance-Leahy Scale: Poor       Standing balance-Leahy Scale: Poor                              Cognition Arousal/Alertness: Awake/alert Behavior During Therapy: WFL for tasks assessed/performed Overall Cognitive Status: History of cognitive impairments - at baseline                                          Exercises      General Comments        Pertinent Vitals/Pain Pain Assessment Pain Assessment: No/denies pain    Home Living                          Prior Function  PT Goals (current goals can now be found in the care plan section) Acute Rehab PT Goals Patient Stated Goal: Get back to rehab and go home with more independence as soon as possible. PT Goal Formulation: With patient Potential to Achieve Goals: Good Progress towards PT goals: Progressing toward goals    Frequency    Min 4X/week      PT Plan Current plan remains appropriate    Co-evaluation              AM-PAC PT "6 Clicks" Mobility   Outcome Measure  Help needed turning from your back to your side while in a flat bed without using bedrails?: Total Help needed moving from lying on your back to sitting on the side of a flat bed without using bedrails?: Total Help needed moving to and from a bed to a chair (including a wheelchair)?: Total Help needed standing up from a chair using your arms (e.g., wheelchair or bedside chair)?: A Lot Help needed to walk in hospital room?:  Total Help needed climbing 3-5 steps with a railing? : Total 6 Click Score: 7    End of Session Equipment Utilized During Treatment:  (L knee hinge brace,  Needs some adjusting with straps to fit better.) Activity Tolerance: Patient tolerated treatment well Patient left: with call bell/phone within reach;Other (comment) (in Power WC with WC power off) Nurse Communication: Mobility status PT Visit Diagnosis: Unsteadiness on feet (R26.81);Muscle weakness (generalized) (M62.81);Difficulty in walking, not elsewhere classified (R26.2);Other symptoms and signs involving the nervous system (R29.898);Adult, failure to thrive (R62.7);Hemiplegia and hemiparesis Hemiplegia - Right/Left: Left Hemiplegia - dominant/non-dominant: Non-dominant Hemiplegia - caused by: Unspecified     Time: 1791-5056 PT Time Calculation (min) (ACUTE ONLY): 33 min  Charges:  $Therapeutic Activity: 23-37 mins                     Erasmo Leventhal , PTA Acute Rehabilitation Services Office [phone]    Tkeya Stencil Eli Hose 03/31/2022, 4:40 PM

## 2022-03-31 NOTE — Progress Notes (Signed)
                                                     Palliative Care Progress Note, Assessment & Plan   Patient Name: Sharon Odom       Date: 03/31/2022 DOB: 1970-06-11  Age: 51 y.o. MRN#: [id number] Attending Physician: Edwin Dada, * Primary Care Physician: Oren Section, NP-C Admit Date: 03/27/2022  Reason for Consultation/Follow-up: Establishing goals of care  Subjective: Patient is sitting up in bed in no apparent distress.  She acknowledges my presence and is able to make her wishes known.  She jokes that she still has toothpaste on her face.    Nurse is at bedside and administering AM medications.  Nurse tech is at bedside and preparing to reposition patient.  Summary of counseling/coordination of care: After reviewing the patient's chart and assessing the patient at bedside, I attempted to continue goals of care discussions with patient.  She shared she had no questions in regards to yesterday's discussion.  She commented that it was heavy stuff and that she does not really feel up for talking about it right now.  I reviewed the patient made her wishes known that in the event of a cardiopulmonary arrest that she would not want medical intervention but rather allow natural passing.  I reviewed patient had elected a DNR but that further discussion with her husband would be needed before placing this as a permanent decision in her record.    She shared she was not sure when her husband was coming to the hospital.  I offered to schedule a time to meet with the patient and her husband.  PMT contact info left for patient to reach out when she is prepared to again discuss goals of care.  I counseled with Dr. Loleta Books.  Ongoing discussions of patient's current medical situation, prognosis, and medical needs as well as clarification of goals of  care are needed.  PMT will continue to follow the patient throughout her hospitalization.  I am off service tomorrow but plan on returning on Monday.  I will round on the patient and continue discussions at that time.        Total Time 25 minutes  Greater than 50%  of this time was spent counseling and coordinating care related to the above assessment and plan.  Thank you for allowing the Palliative Medicine Team to assist in the care of this patient.  Tampico Ilsa Iha, FNP-BC Palliative Medicine Team Team Phone # [phone]

## 2022-03-31 NOTE — Progress Notes (Signed)
  Progress Note   Patient: Sharon Odom PFX:[id number] DOB: [birth year] DOA: 03/27/2022     4 DOS: the patient was seen and examined on 03/31/2022 at 12:27 PM      Brief hospital course: Mrs. Konieczny is a 51 y.o. F with relapsing progressive MS  Please see prior summary from 11/2.    Assessment and Plan: * Acute metabolic encephalopathy Resolved.  Please see longer summary from 11/2 - We will avoid adding or increasing any psychoactive medications at this time - If needed would recommend psychoactive medicines to be started when at the time    Relapsing remitting multiple sclerosis ([place]) -Continue dalfampridine and baclofen pump without changes - Continue amantadine and fesoterodine - Stable to transfer back to inpatient rehab as soon as approved   Failure to thrive in adult Overall decline over months.  Given challenges with managing spasticity and pain in the last 2 weeks, I have concern that her symptom burden is growing and may not yield to medical treatments. - Consult palliative care       UTI (urinary tract infection) with pyuria -Complete Zosyn today  Depression with anxiety -Continue bupropion          Subjective: No new complaints, pain in her left arm, left leg, and right lower leg is tolerable with oxycodone.  No fever, confusion.  She is somewhat constipated.     Physical Exam: BP 137/87 (BP Location: Right Arm)   Pulse 73   Temp 97.7 F (36.5 C) (Oral)   Resp 18   SpO2 100%   Adult female, sitting up in bed, interactive and appropriate RRR, no murmurs, no peripheral edema, no JVD Respiratory rate normal, lungs clear without rales or wheezes Abdomen soft, baclofen pump without tenderness or redness Attention alert, affect normal, voice somewhat dysarthric but at baseline, hypophonia resolved, left-sided weakness and spasticity are notable.         Data Reviewed: Glucose normal  Family Communication: Sister-in-law at the  bedside    Disposition: Status is: Inpatient Stable for transfer back to CIR        Author: Edwin Dada, MD 03/31/2022 2:02 PM  For on call review [email or web address].

## 2022-03-31 NOTE — Progress Notes (Signed)
Asked by 2W nursing to perform digital stimulation as requested by patient. Felt soft stool in rectal vault, no hard stool felt. Patient on [facility] and wanted more time and will notify staff when ready for peri-care. Patient aware that a suppository may be needed.

## 2022-04-01 MED ORDER — BACLOFEN 10 MG PO TABS
20.0000 mg | ORAL_TABLET | Freq: Two times a day (BID) | ORAL | Status: DC
  Administered 2022-04-01 – 2022-04-03 (×5): 20 mg via ORAL
  Filled 2022-04-01 (×5): qty 2

## 2022-04-01 MED ORDER — BACLOFEN 10 MG PO TABS: 20.0000 mg | ORAL_TABLET | Freq: Three times a day (TID) | ORAL | Status: DC

## 2022-04-01 MED ORDER — BACLOFEN 10 MG PO TABS
40.0000 mg | ORAL_TABLET | Freq: Every day | ORAL | Status: DC
  Administered 2022-04-02 – 2022-04-03 (×2): 40 mg via ORAL
  Filled 2022-04-01 (×2): qty 4

## 2022-04-01 NOTE — Progress Notes (Signed)
  Progress Note   Patient: Sharon Odom IRC:[id number] DOB: 1971-04-17 DOA: 03/27/2022     5 DOS: the patient was seen and examined on 04/01/2022 at 12:27 PM      Brief hospital course: Mrs. Schaaf is a 51 y.o. F with relapsing progressive MS  Please see prior summary from 11/2.    Assessment and Plan: * Acute metabolic encephalopathy Resolved.  Please see longer summary from 11/2 - If needed would recommend psychoactive medicines to be started when at the time    Relapsing remitting multiple sclerosis ([place]) -Continue dalfampridine and baclofen pump without changes - Continue amantadine and fesoterodine - Continue baclofen, increase back to 40-20-20 dose   Failure to thrive in adult Overall decline over months.  Given challenges with managing spasticity and pain in the last 2 weeks, I have concern that her symptom burden is growing and may not yield to medical treatments. - Consult palliative care       UTI (urinary tract infection) with pyuria Completed 5 days of Zosyn  Depression with anxiety - Continue bupropion          Subjective: Pain is adequately controlled with oxycodone.  Legs are stiff in the morning.  No other new complaints.  No fever.     Physical Exam: BP 97/69 (BP Location: Right Arm)   Pulse 75   Temp 97.6 F (36.4 C) (Oral)   Resp 18   Ht 5\' 9"  (1.753 m)   Wt 56.2 kg   SpO2 98%   BMI 18.30 kg/m   Adult female, sitting up in bed, eating breakfast, interactive and appropriate RRR, no murmurs, no peripheral edema, no JVD Respiratory rate normal, lungs clear without rales or wheezes Abdomen soft, no tenderness palpation or guarding Attention normal, affect blunted by EMS, speech somewhat dysarthric but at baseline, right arm strength and coordination appears normal, she is somewhat stiff in the left arm, and stiff in bilateral lower extremities       Data Reviewed: No new labs  Family Communication: None   Disposition: Status  is: Inpatient Stable for transfer back to CIR        Author: Edwin Dada, MD 04/01/2022 11:46 AM  For on call review [email or web address].

## 2022-04-01 NOTE — Plan of Care (Signed)
  Problem: Education: Goal: Knowledge of General Education information will improve Description: Including pain rating scale, medication(s)/side effects and non-pharmacologic comfort measures Outcome: Progressing   Problem: Health Behavior/Discharge Planning: Goal: Ability to manage health-related needs will improve Outcome: Progressing   Problem: Clinical Measurements: Goal: Ability to maintain clinical measurements within normal limits will improve Outcome: Progressing Goal: Will remain free from infection Outcome: Progressing   Problem: Activity: Goal: Risk for activity intolerance will decrease Outcome: Progressing   Problem: Nutrition: Goal: Adequate nutrition will be maintained Outcome: Progressing   Problem: Coping: Goal: Level of anxiety will decrease Outcome: Progressing   Problem: Elimination: Goal: Will not experience complications related to bowel motility Outcome: Progressing   Problem: Pain Managment: Goal: General experience of comfort will improve Outcome: Progressing   Problem: Skin Integrity: Goal: Risk for impaired skin integrity will decrease Outcome: Progressing   Problem: Education: Goal: Ability to describe self-care measures that may prevent or decrease complications (Diabetes Survival Skills Education) will improve Outcome: Progressing Goal: Individualized Educational Video(s) Outcome: Progressing   Problem: Coping: Goal: Ability to adjust to condition or change in health will improve Outcome: Progressing   Problem: Fluid Volume: Goal: Ability to maintain a balanced intake and output will improve Outcome: Progressing   Problem: Health Behavior/Discharge Planning: Goal: Ability to identify and utilize available resources and services will improve Outcome: Progressing Goal: Ability to manage health-related needs will improve Outcome: Progressing   Problem: Metabolic: Goal: Ability to maintain appropriate glucose levels will  improve Outcome: Progressing   Problem: Nutritional: Goal: Maintenance of adequate nutrition will improve Outcome: Progressing Goal: Progress toward achieving an optimal weight will improve Outcome: Progressing   Problem: Skin Integrity: Goal: Risk for impaired skin integrity will decrease Outcome: Progressing   Problem: Tissue Perfusion: Goal: Adequacy of tissue perfusion will improve Outcome: Progressing   

## 2022-04-01 NOTE — Progress Notes (Addendum)
Pharmacy Antibiotic Note  Sharon Odom is a 51 y.o. female  with h/o intermittent relapsing MS.,  admitted on 03/27/2022  from inpatient rehab back to [facility] acute care  secondary progressive multiple sclerosis . She was initially admitted to CIR for titration of baclofen pump due to increased spasticity and worsening mobility. While in rehab she was treated for a UTI with Cefepime. Neurology recommended change from Cefepime to Zosyn due to the risk of neurotoxicity. Pharmacy consulted  for Zosyn dosing for UTI.    WBC wnl.   afebrile,  SCr  0.77,  CrCl 74.6 ml/min  Plan: Continue zosyn 3.375 g IV q8h (EI  4hour infusion of each dose) Monitor clinical status, renal function and culture results daily.   Temp (24hrs), Avg:97.7 F (36.5 C), Min:97.7 F (36.5 C), Max:97.7 F (36.5 C)  Recent Labs  Lab 03/26/22 0506 03/29/22 0439 03/30/22 0311  WBC 8.0 14.5* 10.1  CREATININE 1.20* 1.25* 0.77     Estimated Creatinine Clearance: 74.6 mL/min (by C-G formula based on SCr of 0.77 mg/dL).    Allergies  Allergen Reactions   Ziconotide Acetate Shortness Of Breath    Anorexia, weird sensations, could not talk, choking feeling, lost since of taste and smell. Hallucinations, vivid dreams. Confusion and sleepiness. N/v, increase in pain.  Anorexia, weird sensations, could not talk, choking feeling, lost since of taste and smell. Hallucinations, vivid dreams. Confusion and sleepiness. N/v, increase in pain.    Morphine Other (See Comments)    Pt does not recall reaction    Amoxicillin Nausea And Vomiting   Dantrolene Other (See Comments)    Muscle pain   Lamotrigine Other (See Comments)    Join pain   Lyrica [Pregabalin] Nausea And Vomiting    Antibiotics this admission: Ctx 12/28 x1 Cefepime 10/29 >>11/1 Zosyn  11/1>>(11/5)  Cultures: 10/28 BCx: negative 10/27 UCx:  Klebsiella R to Amp, nitro; Enterobacter cloacae R to cefazolin.  Thank you for allowing pharmacy to be part of this  patients care team.  Titus Dubin, PharmD PGY1 Pharmacy Resident 04/01/2022 7:31 AM

## 2022-04-02 ENCOUNTER — Ambulatory Visit: Payer: BLUE CROSS/BLUE SHIELD | Admitting: Physical Medicine and Rehabilitation

## 2022-04-02 LAB — CBC
HCT: 35.1 % — ABNORMAL LOW (ref 36.0–46.0)
Hemoglobin: 10.9 g/dL — ABNORMAL LOW (ref 12.0–15.0)
MCH: 30.8 pg (ref 26.0–34.0)
MCHC: 31.1 g/dL (ref 30.0–36.0)
MCV: 99.2 fL (ref 80.0–100.0)
Platelets: 306 10*3/uL (ref 150–400)
RBC: 3.54 MIL/uL — ABNORMAL LOW (ref 3.87–5.11)
RDW: 12.5 % (ref 11.5–15.5)
WBC: 8.8 10*3/uL (ref 4.0–10.5)
nRBC: 0 % (ref 0.0–0.2)

## 2022-04-02 LAB — COMPREHENSIVE METABOLIC PANEL
ALT: 16 U/L (ref 0–44)
AST: 17 U/L (ref 15–41)
Albumin: 2.8 g/dL — ABNORMAL LOW (ref 3.5–5.0)
Alkaline Phosphatase: 51 U/L (ref 38–126)
Anion gap: 5 (ref 5–15)
BUN: 12 mg/dL (ref 6–20)
CO2: 27 mmol/L (ref 22–32)
Calcium: 9.4 mg/dL (ref 8.9–10.3)
Chloride: 109 mmol/L (ref 98–111)
Creatinine, Ser: 0.76 mg/dL (ref 0.44–1.00)
GFR, Estimated: >60 mL/min (ref >60)
Glucose, Bld: 95 mg/dL (ref 70–99)
Potassium: 4.6 mmol/L (ref 3.5–5.1)
Sodium: 141 mmol/L (ref 135–145)
Total Bilirubin: 0.4 mg/dL (ref 0.3–1.2)
Total Protein: 5.5 g/dL — ABNORMAL LOW (ref 6.5–8.1)

## 2022-04-02 MED ORDER — ZOLPIDEM TARTRATE 5 MG PO TABS
5.0000 mg | ORAL_TABLET | Freq: Every day | ORAL | Status: AC
  Administered 2022-04-02: 5 mg via ORAL
  Filled 2022-04-02: qty 1

## 2022-04-02 NOTE — Progress Notes (Signed)
  Inpatient Rehabilitation Admissions Coordinator   I await insurance approval to readmit to CIR.   Danne Baxter, RN, MSN Rehab Admissions Coordinator [phone] 04/02/2022 10:08 AM

## 2022-04-02 NOTE — Plan of Care (Signed)
  Problem: Education: Goal: Knowledge of General Education information will improve Description: Including pain rating scale, medication(s)/side effects and non-pharmacologic comfort measures Outcome: Progressing   Problem: Health Behavior/Discharge Planning: Goal: Ability to manage health-related needs will improve Outcome: Progressing   Problem: Clinical Measurements: Goal: Ability to maintain clinical measurements within normal limits will improve Outcome: Progressing Goal: Will remain free from infection Outcome: Progressing   Problem: Activity: Goal: Risk for activity intolerance will decrease Outcome: Progressing   Problem: Nutrition: Goal: Adequate nutrition will be maintained Outcome: Progressing   Problem: Coping: Goal: Level of anxiety will decrease Outcome: Progressing   Problem: Elimination: Goal: Will not experience complications related to bowel motility Outcome: Progressing   Problem: Pain Managment: Goal: General experience of comfort will improve Outcome: Progressing   Problem: Skin Integrity: Goal: Risk for impaired skin integrity will decrease Outcome: Progressing   Problem: Education: Goal: Ability to describe self-care measures that may prevent or decrease complications (Diabetes Survival Skills Education) will improve Outcome: Progressing Goal: Individualized Educational Video(s) Outcome: Progressing   Problem: Coping: Goal: Ability to adjust to condition or change in health will improve Outcome: Progressing   Problem: Fluid Volume: Goal: Ability to maintain a balanced intake and output will improve Outcome: Progressing   Problem: Health Behavior/Discharge Planning: Goal: Ability to identify and utilize available resources and services will improve Outcome: Progressing Goal: Ability to manage health-related needs will improve Outcome: Progressing   Problem: Metabolic: Goal: Ability to maintain appropriate glucose levels will  improve Outcome: Progressing   Problem: Nutritional: Goal: Maintenance of adequate nutrition will improve Outcome: Progressing Goal: Progress toward achieving an optimal weight will improve Outcome: Progressing   Problem: Skin Integrity: Goal: Risk for impaired skin integrity will decrease Outcome: Progressing   Problem: Tissue Perfusion: Goal: Adequacy of tissue perfusion will improve Outcome: Progressing

## 2022-04-02 NOTE — Progress Notes (Signed)
  Progress Note   Patient: Sharon Odom [id number]:[id number] DOB: 08-Aug-1970 DOA: 03/27/2022     6 DOS: the patient was seen and examined on 04/02/2022 at 8:45 AM      Brief hospital course: Sharon Odom is a 51 y.o. F with relapsing progressive MS  Please see prior summary from 11/2.    Assessment and Plan: * Acute metabolic encephalopathy Resolved.  Please see longer summary from 11/2 - If needed would recommend psychoactive medicines to be started when at the time    Relapsing remitting multiple sclerosis ([place]) - Continue dalfampridine, baclofen pump without changes - Continue amantadine and fesoterodine   - Continue baclofen, 40-20-20  UTI (urinary tract infection) with pyuria Completed 5 days of Zosyn  Depression with anxiety -Continue bupropion          Subjective: Still has pain, but similar to baseline, leg stiffness is somewhat normal, no other new complaints, difficulty sleeping last night, no new fever, confusion, respiratory symptoms or urinary symptoms.    Physical Exam: BP 107/66 (BP Location: Right Arm)   Pulse 63   Temp 97.7 F (36.5 C) (Oral)   Resp 18   Ht 5\' 9"  (1.753 m)   Wt 56.2 kg   SpO2 100%   BMI 18.30 kg/m   Thin adult female, standing with walker working with physical therapy, then later sitting in her motorized wheelchair.    RRR, no murmurs, no peripheral edema Respiratory rate normal, lungs clear without rales or wheezes Abdomen soft, no tenderness palpation or guarding, no tenderness on her baclofen pump Attention normal, affect blunted by MS, speech dysarthric but at baseline.          Data Reviewed: Comprehensive metabolic panel normal.  CBC shows mild anemia no change.   Family Communication: None   Disposition: Status is: Inpatient Stable for transfer back to CIR        Author: Edwin Dada, MD 04/02/2022 1:39 PM  For on call review [email or web address].

## 2022-04-02 NOTE — Progress Notes (Signed)
This chaplain responded to RN consult for creating/updating the Pt. Advance Directive.   The chaplain was updated by PMT NP-Kathryn and RN-Kayln before the visit.  The Pt. is awake and expressed her interest in continued AD education with her husband. The chaplain left an incomplete document with the Pt. to read with her husband before the chaplain's next visit.   This chaplain is available for F/U spiritual care as needed.  Chaplain Sallyanne Kuster [phone]

## 2022-04-02 NOTE — Progress Notes (Signed)
Physical Therapy Treatment Patient Details Name: Sharon Odom MRN: [id number] DOB: 1970/08/07 Today's Date: 04/02/2022   History of Present Illness 51 y.o. F admitted 10/31 from [facility] for FTT and UTI. Pt admitted to [facility] from home 10/17 to tune baclofen pump. PMHx: relapsing progressive MS, baclofen pump recently replaced (May 2023), thrombocytopenia, anxiety, asthma and chronic pain    PT Comments    Pt pleasant and eager to get up and progress mobility. Pt able to transition to standing and perform limited gait with RW and close chair for fatigue. Pt happy to return to limited ambulation and positioned in power chair end of session. Pt assisting LLE with leg lifter in supine and liting RLE against gravity. Educated for seated and bed level HEP. Will continue to follow.     Recommendations for follow up therapy are one component of a multi-disciplinary discharge planning process, led by the attending physician.  Recommendations may be updated based on patient status, additional functional criteria and insurance authorization.  Follow Up Recommendations  Acute inpatient rehab (3hours/day)     Assistance Recommended at Discharge Frequent or constant Supervision/Assistance  Patient can return home with the following Two people to help with walking and/or transfers;Two people to help with bathing/dressing/bathroom;Assistance with cooking/housework;Assist for transportation;Help with stairs or ramp for entrance;Direct supervision/assist for medications management   Equipment Recommendations  None recommended by PT    Recommendations for Other Services       Precautions / Restrictions Precautions Precautions: Fall Required Braces or Orthoses: Other Brace Other Brace: Hinge brace for Lt knee- need clarification of wear Restrictions Weight Bearing Restrictions: No     Mobility  Bed Mobility Overal bed mobility: Needs Assistance Bed Mobility: Rolling, Sidelying to Sit Rolling: Min  guard Sidelying to sit: Mod assist       General bed mobility comments: pt able to roll to left with use of rail and cues, physical assist to lift trunk and clear LLE from surface. Total assist to don left knee brace in supine    Transfers Overall transfer level: Needs assistance   Transfers: Sit to/from Stand Sit to Stand: Min assist           General transfer comment: min assist to rise from bed with RW present and use of belt, cues for hand placement and safety, initial min assist for sitting balance with progression to minguard with cues for posture and midline    Ambulation/Gait Ambulation/Gait assistance: Mod assist Gait Distance (Feet): 8 Feet Assistive device: Rolling walker (2 wheels) Gait Pattern/deviations: Step-to pattern, Narrow base of support   Gait velocity interpretation: <1.31 ft/sec, indicative of household ambulator   General Gait Details: pt with narrow BOS with reliance on bil UE use on RW to off weight lower body for stepping. Pt able to advance each leg but increased effort with LLE. Pt fatigues quickly with limited ability to lift LLE with fatigue and needing chair pulled under her. Max +2 to shift hips in power chair   Stairs             Wheelchair Mobility    Modified Rankin (Stroke Patients Only)       Balance Overall balance assessment: Needs assistance   Sitting balance-Leahy Scale: Poor Sitting balance - Comments: posterior right bias with min assist progressing to minguard assist EOb grossly 5 min   Standing balance support: Bilateral upper extremity supported, During functional activity, Reliant on assistive device for balance Standing balance-Leahy Scale: Poor Standing balance comment: physical assist  and Rw for balance                            Cognition Arousal/Alertness: Awake/alert Behavior During Therapy: Flat affect Overall Cognitive Status: History of cognitive impairments - at baseline                                           Exercises      General Comments        Pertinent Vitals/Pain Pain Assessment Pain Assessment: 0-10 Pain Score: 7  Pain Location: generalized Pain Descriptors / Indicators: Aching Pain Intervention(s): RN gave pain meds during session, Monitored during session, Repositioned    Home Living                          Prior Function            PT Goals (current goals can now be found in the care plan section) Progress towards PT goals: Progressing toward goals    Frequency    Min 4X/week      PT Plan Current plan remains appropriate    Co-evaluation              AM-PAC PT "6 Clicks" Mobility   Outcome Measure  Help needed turning from your back to your side while in a flat bed without using bedrails?: A Lot Help needed moving from lying on your back to sitting on the side of a flat bed without using bedrails?: A Lot Help needed moving to and from a bed to a chair (including a wheelchair)?: Total Help needed standing up from a chair using your arms (e.g., wheelchair or bedside chair)?: A Lot Help needed to walk in hospital room?: Total Help needed climbing 3-5 steps with a railing? : Total 6 Click Score: 9    End of Session Equipment Utilized During Treatment: Gait belt Activity Tolerance: Patient tolerated treatment well Patient left: in chair;with call bell/phone within reach Nurse Communication: Mobility status PT Visit Diagnosis: Unsteadiness on feet (R26.81);Muscle weakness (generalized) (M62.81);Other symptoms and signs involving the nervous system (R29.898);Adult, failure to thrive (R62.7);Other abnormalities of gait and mobility (R26.89)     Time: 5809-9833 PT Time Calculation (min) (ACUTE ONLY): 35 min  Charges:  $Therapeutic Activity: 23-37 mins                     Merryl Hacker, PT Acute Rehabilitation Services Office: [phone]    Enedina Finner Kacey Dysert 04/02/2022, 9:41 AM

## 2022-04-02 NOTE — Progress Notes (Signed)
Palliative Care Progress Note, Assessment & Plan   Patient Name: Sharon Odom       Date: 04/02/2022 DOB: [birth year]  Age: 51 y.o. MRN#: [id number] Attending Physician: Sharon Odom, * Primary Care Physician: Sharon Section, NP-C Admit Date: 03/27/2022  Reason for Consultation/Follow-up: Establishing goals of care  Summary of counseling/coordination of care: After reviewing the patient's chart and assessing the patient at bedside, I spoke with patient, her sister in law Sharon Odom, and her father in Clinical biochemist at bedside.   Therapeutic silence and active listening provided for patient and family to share their thoughts and emotions regarding current medical situation. Emotional support provided. Family/pt discussed MS support groups, family/friend support, and finding resources/in-home assistance to help Sharon Odom and her husband navigate once at home.  Patient is facing advanced care planning and anticipatory care needs. Advanced directive and MOST form discussed. Copies of both AD and MOST left at bedside.   I attempted to elicit goals and values important to the patient. Patient verbalized that she would not want to live on machines and would never want a feeding tube. However, she does not want to make any final decisions without her husband being involved in the discussions. She says he will likely be at the hospital after work (5pm) tomorrow (11/7).   Pt is awaiting insurance approval and is agreement to return to [facility].  After meeting with the patient and family, I spoke with patient's husband Sharon Odom over the phone. He vocalizes that there is "no crystal ball" to say how quickly Sharon Odom's MS will progress, but he is open to resources and support to help her/him since he works during the week.   I  attempted to discuss goals of care with Sharon Odom. He shares his goal is that Sharon Odom will be able to transfer from chair to bed/shower/commode. He verbalizes that he thinks she may be in different place physically but that MS is so unpredictable that he is not sure what her new baseline may be.   I shared my insight from previous discussions with Sharon Odom in regards to code status and artificial nutrition and hydration. I also conveyed that Sharon Odom would like to speak with husband Sharon Odom about these matters before finalizing them. Sharon Odom says that he and Sharon Odom have not yet discussed boundaries of care or patient's wishes. I encouraged patient and husband to discuss Sharon Odom's current medical situation, goals, boundaries of care, and wishes. PMT is available to patient and husband when appropriate to help in these Sharon Odom discussions.   Sharon Odom was appreciative of our discussion. Questions and concerns were addressed.  PMT will continue to follow and support the patient throughout her hospitalization.   Physical Exam Vitals reviewed.  HENT:     Head: Normocephalic.     Mouth/Throat:     Mouth: Mucous membranes are moist.  Eyes:     Pupils: Pupils are equal, round, and reactive to light.  Cardiovascular:     Rate and Rhythm: Normal rate.     Pulses: Normal pulses.  Pulmonary:     Effort: Pulmonary effort is normal.  Musculoskeletal:     Comments: Left sided weakness  Skin:    General: Skin is warm and dry.  Neurological:     Mental Status: She is alert and oriented to person, place, and time.  Psychiatric:        Mood and Affect: Mood normal.        Behavior: Behavior normal.        Thought Content: Thought content normal.        Judgment: Judgment normal.             Palliative Assessment/Data: 50%    Total Time 50 minutes  Greater than 50%  of this time was spent counseling and coordinating care related to the above assessment and plan.  Thank you for allowing the Palliative Medicine  Team to assist in the care of this patient.  Sharon Odom L. Manon Hilding, FNP-BC Palliative Medicine Team Team Phone # [phone]

## 2022-04-03 ENCOUNTER — Inpatient Hospital Stay (HOSPITAL_COMMUNITY)
Admission: RE | Admit: 2022-04-03 | Discharge: 2022-05-04 | DRG: 945 | Disposition: A | Discharge status: Skilled Nursing Facility | Payer: BLUE CROSS/BLUE SHIELD | Source: Intra-hospital | Attending: Physical Medicine and Rehabilitation | Admitting: Physical Medicine and Rehabilitation

## 2022-04-03 ENCOUNTER — Encounter (HOSPITAL_COMMUNITY): Payer: Self-pay | Admitting: Physical Medicine and Rehabilitation

## 2022-04-03 ENCOUNTER — Other Ambulatory Visit: Payer: Self-pay

## 2022-04-03 DIAGNOSIS — R29898 Other symptoms and signs involving the musculoskeletal system: Secondary | ICD-10-CM | POA: Diagnosis not present

## 2022-04-03 DIAGNOSIS — R1310 Dysphagia, unspecified: Secondary | ICD-10-CM | POA: Diagnosis not present

## 2022-04-03 DIAGNOSIS — L89892 Pressure ulcer of other site, stage 2: Secondary | ICD-10-CM | POA: Diagnosis present

## 2022-04-03 DIAGNOSIS — F419 Anxiety disorder, unspecified: Secondary | ICD-10-CM | POA: Diagnosis present

## 2022-04-03 DIAGNOSIS — G35 Multiple sclerosis: Secondary | ICD-10-CM | POA: Diagnosis present

## 2022-04-03 DIAGNOSIS — G249 Dystonia, unspecified: Secondary | ICD-10-CM | POA: Diagnosis not present

## 2022-04-03 DIAGNOSIS — E876 Hypokalemia: Secondary | ICD-10-CM | POA: Diagnosis present

## 2022-04-03 DIAGNOSIS — R252 Cramp and spasm: Secondary | ICD-10-CM | POA: Diagnosis present

## 2022-04-03 DIAGNOSIS — S9031XD Contusion of right foot, subsequent encounter: Secondary | ICD-10-CM

## 2022-04-03 DIAGNOSIS — F17210 Nicotine dependence, cigarettes, uncomplicated: Secondary | ICD-10-CM | POA: Diagnosis present

## 2022-04-03 DIAGNOSIS — R471 Dysarthria and anarthria: Secondary | ICD-10-CM | POA: Diagnosis present

## 2022-04-03 DIAGNOSIS — G4700 Insomnia, unspecified: Secondary | ICD-10-CM | POA: Diagnosis present

## 2022-04-03 DIAGNOSIS — R1312 Dysphagia, oropharyngeal phase: Secondary | ICD-10-CM | POA: Diagnosis not present

## 2022-04-03 DIAGNOSIS — H532 Diplopia: Secondary | ICD-10-CM | POA: Diagnosis not present

## 2022-04-03 DIAGNOSIS — Z885 Allergy status to narcotic agent status: Secondary | ICD-10-CM

## 2022-04-03 DIAGNOSIS — Z1621 Resistance to vancomycin: Secondary | ICD-10-CM | POA: Diagnosis present

## 2022-04-03 DIAGNOSIS — N319 Neuromuscular dysfunction of bladder, unspecified: Secondary | ICD-10-CM | POA: Diagnosis present

## 2022-04-03 DIAGNOSIS — N390 Urinary tract infection, site not specified: Secondary | ICD-10-CM | POA: Diagnosis not present

## 2022-04-03 DIAGNOSIS — K5900 Constipation, unspecified: Secondary | ICD-10-CM | POA: Diagnosis present

## 2022-04-03 DIAGNOSIS — J302 Other seasonal allergic rhinitis: Secondary | ICD-10-CM | POA: Diagnosis present

## 2022-04-03 DIAGNOSIS — M7989 Other specified soft tissue disorders: Secondary | ICD-10-CM | POA: Diagnosis not present

## 2022-04-03 DIAGNOSIS — F32A Depression, unspecified: Secondary | ICD-10-CM | POA: Diagnosis present

## 2022-04-03 DIAGNOSIS — Z79899 Other long term (current) drug therapy: Secondary | ICD-10-CM

## 2022-04-03 DIAGNOSIS — Z880 Allergy status to penicillin: Secondary | ICD-10-CM

## 2022-04-03 DIAGNOSIS — K219 Gastro-esophageal reflux disease without esophagitis: Secondary | ICD-10-CM | POA: Diagnosis present

## 2022-04-03 DIAGNOSIS — L89526 Pressure-induced deep tissue damage of left ankle: Secondary | ICD-10-CM | POA: Diagnosis present

## 2022-04-03 DIAGNOSIS — Z888 Allergy status to other drugs, medicaments and biological substances status: Secondary | ICD-10-CM

## 2022-04-03 DIAGNOSIS — Z993 Dependence on wheelchair: Secondary | ICD-10-CM

## 2022-04-03 DIAGNOSIS — K592 Neurogenic bowel, not elsewhere classified: Secondary | ICD-10-CM | POA: Diagnosis present

## 2022-04-03 DIAGNOSIS — Z87440 Personal history of urinary (tract) infections: Secondary | ICD-10-CM

## 2022-04-03 DIAGNOSIS — Z7189 Other specified counseling: Secondary | ICD-10-CM | POA: Diagnosis not present

## 2022-04-03 DIAGNOSIS — L8990 Pressure ulcer of unspecified site, unspecified stage: Secondary | ICD-10-CM | POA: Diagnosis present

## 2022-04-03 DIAGNOSIS — R491 Aphonia: Secondary | ICD-10-CM | POA: Diagnosis not present

## 2022-04-03 DIAGNOSIS — R5381 Other malaise: Secondary | ICD-10-CM | POA: Diagnosis present

## 2022-04-03 DIAGNOSIS — Z66 Do not resuscitate: Secondary | ICD-10-CM | POA: Diagnosis not present

## 2022-04-03 DIAGNOSIS — Z515 Encounter for palliative care: Secondary | ICD-10-CM | POA: Diagnosis not present

## 2022-04-03 DIAGNOSIS — G479 Sleep disorder, unspecified: Secondary | ICD-10-CM | POA: Diagnosis not present

## 2022-04-03 DIAGNOSIS — B9620 Unspecified Escherichia coli [E. coli] as the cause of diseases classified elsewhere: Secondary | ICD-10-CM | POA: Diagnosis not present

## 2022-04-03 DIAGNOSIS — S300XXD Contusion of lower back and pelvis, subsequent encounter: Secondary | ICD-10-CM

## 2022-04-03 LAB — CREATININE, SERUM
Creatinine, Ser: 0.71 mg/dL (ref 0.44–1.00)
GFR, Estimated: >60 mL/min (ref >60)

## 2022-04-03 MED ORDER — SODIUM CHLORIDE 0.9% FLUSH: 3.0000 mL | INTRAVENOUS | Status: DC | PRN

## 2022-04-03 MED ORDER — BISACODYL 10 MG RE SUPP
10.0000 mg | Freq: Every day | RECTAL | Status: DC | PRN
  Administered 2022-04-08: 10 mg via RECTAL
  Filled 2022-04-03: qty 1

## 2022-04-03 MED ORDER — POLYVINYL ALCOHOL 1.4 % OP SOLN
1.0000 [drp] | Freq: Three times a day (TID) | OPHTHALMIC | Status: DC | PRN
  Administered 2022-04-15 – 2022-04-22 (×2): 1 [drp] via OPHTHALMIC
  Filled 2022-04-03: qty 15

## 2022-04-03 MED ORDER — TRAZODONE HCL 50 MG PO TABS: 50.0000 mg | ORAL_TABLET | Freq: Every day | ORAL | Status: DC

## 2022-04-03 MED ORDER — ENOXAPARIN SODIUM 40 MG/0.4ML IJ SOSY
40.0000 mg | PREFILLED_SYRINGE | INTRAMUSCULAR | Status: DC
  Administered 2022-04-04 – 2022-05-03 (×30): 40 mg via SUBCUTANEOUS
  Filled 2022-04-03 (×29): qty 0.4

## 2022-04-03 MED ORDER — DIAZEPAM 5 MG PO TABS: 5.0000 mg | ORAL_TABLET | Freq: Three times a day (TID) | ORAL | 0 refills | Status: DC

## 2022-04-03 MED ORDER — DALFAMPRIDINE ER 10 MG PO TB12
10.0000 mg | ORAL_TABLET | Freq: Two times a day (BID) | ORAL | Status: DC
  Administered 2022-04-03 – 2022-04-28 (×44): 10 mg via ORAL
  Filled 2022-04-03 (×50): qty 1

## 2022-04-03 MED ORDER — TAPENTADOL HCL 50 MG PO TABS
50.0000 mg | ORAL_TABLET | ORAL | Status: DC | PRN
  Administered 2022-04-04 – 2022-04-07 (×18): 50 mg via ORAL
  Filled 2022-04-03 (×18): qty 1

## 2022-04-03 MED ORDER — VITAMIN D 25 MCG (1000 UNIT) PO TABS
5000.0000 [IU] | ORAL_TABLET | Freq: Every day | ORAL | Status: DC
  Administered 2022-04-04 – 2022-05-04 (×31): 5000 [IU] via ORAL
  Filled 2022-04-03 (×31): qty 5

## 2022-04-03 MED ORDER — CHLORHEXIDINE GLUCONATE CLOTH 2 % EX PADS
6.0000 | MEDICATED_PAD | Freq: Every day | CUTANEOUS | Status: DC
  Administered 2022-04-05 – 2022-04-09 (×5): 6 via TOPICAL

## 2022-04-03 MED ORDER — PROCHLORPERAZINE MALEATE 5 MG PO TABS: 5.0000 mg | ORAL_TABLET | Freq: Four times a day (QID) | ORAL | Status: DC | PRN

## 2022-04-03 MED ORDER — ZOLPIDEM TARTRATE 5 MG PO TABS
5.0000 mg | ORAL_TABLET | Freq: Every day | ORAL | Status: AC
  Administered 2022-04-03: 5 mg via ORAL
  Filled 2022-04-03: qty 1

## 2022-04-03 MED ORDER — VITAMIN C 500 MG PO TABS
1000.0000 mg | ORAL_TABLET | Freq: Every day | ORAL | Status: DC
  Administered 2022-04-04 – 2022-05-04 (×31): 1000 mg via ORAL
  Filled 2022-04-03 (×31): qty 2

## 2022-04-03 MED ORDER — CALCIUM CARBONATE ANTACID 500 MG PO CHEW
1.0000 | CHEWABLE_TABLET | Freq: Three times a day (TID) | ORAL | Status: DC | PRN
  Administered 2022-04-20: 200 mg via ORAL
  Filled 2022-04-03: qty 1

## 2022-04-03 MED ORDER — ENOXAPARIN SODIUM 30 MG/0.3ML IJ SOSY: 30.0000 mg | PREFILLED_SYRINGE | INTRAMUSCULAR | Status: DC

## 2022-04-03 MED ORDER — BISACODYL 10 MG RE SUPP: 10.0000 mg | Freq: Every day | RECTAL | 0 refills | Status: AC | PRN

## 2022-04-03 MED ORDER — BACLOFEN 20 MG PO TABS: 20.0000 mg | ORAL_TABLET | Freq: Two times a day (BID) | ORAL | 0 refills | Status: DC

## 2022-04-03 MED ORDER — BIOTENE DRY MOUTH MT LIQD: 15.0000 mL | OROMUCOSAL | Status: DC | PRN

## 2022-04-03 MED ORDER — DIAZEPAM 2 MG PO TABS
5.0000 mg | ORAL_TABLET | Freq: Three times a day (TID) | ORAL | Status: DC
  Administered 2022-04-03 – 2022-05-04 (×93): 5 mg via ORAL
  Filled 2022-04-03 (×94): qty 3

## 2022-04-03 MED ORDER — SORBITOL 70 % SOLN
30.0000 mL | Freq: Every day | Status: DC | PRN
  Administered 2022-04-24: 30 mL via ORAL
  Filled 2022-04-03 (×3): qty 30

## 2022-04-03 MED ORDER — BACLOFEN 10 MG PO TABS
40.0000 mg | ORAL_TABLET | Freq: Every day | ORAL | Status: DC
  Administered 2022-04-04 – 2022-04-10 (×7): 40 mg via ORAL
  Filled 2022-04-03 (×7): qty 4

## 2022-04-03 MED ORDER — TRAZODONE HCL 50 MG PO TABS
50.0000 mg | ORAL_TABLET | Freq: Every day | ORAL | Status: AC
  Administered 2022-04-03: 50 mg via ORAL
  Filled 2022-04-03: qty 1

## 2022-04-03 MED ORDER — SODIUM CHLORIDE 0.9 % IV SOLN: 250.0000 mL | INTRAVENOUS | Status: DC | PRN

## 2022-04-03 MED ORDER — OXYCODONE HCL 10 MG PO TABS: 10.0000 mg | ORAL_TABLET | ORAL | 0 refills | Status: DC | PRN

## 2022-04-03 MED ORDER — GUAIFENESIN-DM 100-10 MG/5ML PO SYRP: 5.0000 mL | ORAL_SOLUTION | Freq: Four times a day (QID) | ORAL | Status: DC | PRN

## 2022-04-03 MED ORDER — DOCUSATE SODIUM 100 MG PO CAPS
100.0000 mg | ORAL_CAPSULE | Freq: Every day | ORAL | Status: DC
  Administered 2022-04-03 – 2022-05-03 (×30): 100 mg via ORAL
  Filled 2022-04-03 (×33): qty 1

## 2022-04-03 MED ORDER — BUPROPION HCL ER (XL) 300 MG PO TB24
300.0000 mg | ORAL_TABLET | Freq: Every day | ORAL | Status: DC
  Administered 2022-04-04 – 2022-05-04 (×31): 300 mg via ORAL
  Filled 2022-04-03 (×32): qty 1

## 2022-04-03 MED ORDER — POLYETHYLENE GLYCOL 3350 17 G PO PACK
17.0000 g | PACK | Freq: Every day | ORAL | Status: DC
  Administered 2022-04-04 – 2022-04-05 (×2): 17 g via ORAL
  Filled 2022-04-03 (×2): qty 1

## 2022-04-03 MED ORDER — OXYCODONE HCL 5 MG PO TABS: 10.0000 mg | ORAL_TABLET | ORAL | Status: DC | PRN

## 2022-04-03 MED ORDER — AMANTADINE HCL 100 MG PO CAPS
100.0000 mg | ORAL_CAPSULE | Freq: Three times a day (TID) | ORAL | Status: DC
  Administered 2022-04-03 – 2022-05-02 (×86): 100 mg via ORAL
  Filled 2022-04-03 (×86): qty 1

## 2022-04-03 MED ORDER — POTASSIUM CHLORIDE CRYS ER 20 MEQ PO TBCR
20.0000 meq | EXTENDED_RELEASE_TABLET | ORAL | Status: DC
  Administered 2022-04-04 – 2022-05-04 (×16): 20 meq via ORAL
  Filled 2022-04-03 (×15): qty 1

## 2022-04-03 MED ORDER — SODIUM CHLORIDE 0.9% FLUSH
3.0000 mL | Freq: Two times a day (BID) | INTRAVENOUS | Status: DC
  Administered 2022-04-04 (×2): 3 mL via INTRAVENOUS

## 2022-04-03 MED ORDER — VITAMIN B-12 1000 MCG PO TABS
2500.0000 ug | ORAL_TABLET | Freq: Every day | ORAL | Status: DC
  Administered 2022-04-04 – 2022-05-04 (×31): 2500 ug via ORAL
  Filled 2022-04-03 (×31): qty 3

## 2022-04-03 MED ORDER — FLEET ENEMA 7-19 GM/118ML RE ENEM: 1.0000 | ENEMA | Freq: Once | RECTAL | Status: DC | PRN

## 2022-04-03 MED ORDER — PROCHLORPERAZINE EDISYLATE 10 MG/2ML IJ SOLN: 5.0000 mg | Freq: Four times a day (QID) | INTRAMUSCULAR | Status: DC | PRN

## 2022-04-03 MED ORDER — NUTRISOURCE FIBER PO PACK
1.0000 | PACK | Freq: Every day | ORAL | Status: DC
  Administered 2022-04-04 – 2022-05-04 (×31): 1 via ORAL
  Filled 2022-04-03 (×34): qty 1

## 2022-04-03 MED ORDER — BACLOFEN 40 MG/20ML IT SOLN: 80.0000 mg | Freq: Once | INTRATHECAL | 0 refills | Status: DC

## 2022-04-03 MED ORDER — SENNOSIDES-DOCUSATE SODIUM 8.6-50 MG PO TABS
3.0000 | ORAL_TABLET | Freq: Every day | ORAL | Status: DC
  Administered 2022-04-04 – 2022-04-05 (×2): 3 via ORAL
  Filled 2022-04-03 (×2): qty 3

## 2022-04-03 MED ORDER — NAPROXEN 250 MG PO TABS
500.0000 mg | ORAL_TABLET | Freq: Two times a day (BID) | ORAL | Status: DC
  Administered 2022-04-04 – 2022-04-25 (×44): 500 mg via ORAL
  Filled 2022-04-03 (×45): qty 2

## 2022-04-03 MED ORDER — TIZANIDINE HCL 4 MG PO TABS
4.0000 mg | ORAL_TABLET | Freq: Two times a day (BID) | ORAL | Status: DC
  Administered 2022-04-03 – 2022-04-30 (×55): 4 mg via ORAL
  Filled 2022-04-03 (×56): qty 1

## 2022-04-03 MED ORDER — PSYLLIUM 0.36 G PO CAPS
5.0000 | ORAL_CAPSULE | Freq: Every day | ORAL | Status: DC
  Administered 2022-04-04 – 2022-05-04 (×31): 5 via ORAL
  Filled 2022-04-03 (×34): qty 5

## 2022-04-03 MED ORDER — BACLOFEN 10 MG PO TABS
20.0000 mg | ORAL_TABLET | Freq: Two times a day (BID) | ORAL | Status: DC
  Administered 2022-04-03 – 2022-04-09 (×13): 20 mg via ORAL
  Filled 2022-04-03 (×13): qty 2

## 2022-04-03 MED ORDER — TROSPIUM CHLORIDE 20 MG PO TABS
20.0000 mg | ORAL_TABLET | Freq: Two times a day (BID) | ORAL | Status: DC
  Administered 2022-04-03 – 2022-05-04 (×61): 20 mg via ORAL
  Filled 2022-04-03 (×61): qty 1

## 2022-04-03 MED ORDER — BACLOFEN 20 MG PO TABS: 40.0000 mg | ORAL_TABLET | Freq: Every day | ORAL | 0 refills | Status: DC

## 2022-04-03 MED ORDER — PROCHLORPERAZINE 25 MG RE SUPP: 12.5000 mg | Freq: Four times a day (QID) | RECTAL | Status: DC | PRN

## 2022-04-03 NOTE — Progress Notes (Signed)
Occupational Therapy Treatment Patient Details Name: Sharon Odom MRN: [id number] DOB: 18-Dec-1970 Today's Date: 04/03/2022   History of present illness 51 y.o. F admitted 10/31 from [facility] for FTT and UTI. Pt admitted to [facility] from home 10/17 to tune baclofen pump. PMHx: relapsing progressive MS, baclofen pump recently replaced (May 2023), thrombocytopenia, anxiety, asthma and chronic pain   OT comments  Patient received in supine and stated she had recently returned to bed and asked not to get up from supine.  Patient asked if OT could address LUE hand ROM and finger extension. Patient assisted with AAROM to LUE hand for finger flexion/extension, supination/pronation and finger opposition. AAROM for LUE elbow flexion and extension and AROM to RUE. Patient is expected to discharge to [facility] today.    Recommendations for follow up therapy are one component of a multi-disciplinary discharge planning process, led by the attending physician.  Recommendations may be updated based on patient status, additional functional criteria and insurance authorization.    Follow Up Recommendations  Acute inpatient rehab (3hours/day)    Assistance Recommended at Discharge Frequent or constant Supervision/Assistance  Patient can return home with the following  A lot of help with walking and/or transfers;A lot of help with bathing/dressing/bathroom;Assistance with cooking/housework;Direct supervision/assist for financial management;Assist for transportation;Help with stairs or ramp for entrance   Equipment Recommendations  None recommended by OT    Recommendations for Other Services      Precautions / Restrictions Precautions Precautions: Fall Required Braces or Orthoses: Other Brace Other Brace: Hinge brace for Lt knee- need clarification of wear Restrictions Weight Bearing Restrictions: No       Mobility Bed Mobility                    Transfers                         Balance                                            ADL either performed or assessed with clinical judgement   ADL Overall ADL's : Needs assistance/impaired                                       General ADL Comments: focused on LUE AROM/AAROM    Extremity/Trunk Assessment Upper Extremity Assessment LUE Deficits / Details: Shoulder 2/5, biceps/triceps 3/5, grip 3+/5, finger extension 3/5. LUE Sensation: decreased light touch LUE Coordination: decreased fine motor;decreased gross motor            Vision       Perception     Praxis      Cognition Arousal/Alertness: Awake/alert Behavior During Therapy: Flat affect Overall Cognitive Status: History of cognitive impairments - at baseline                                          Exercises Exercises: Other exercises, General Upper Extremity General Exercises - Upper Extremity Shoulder Flexion: Right, 5 reps, Supine, AROM Elbow Flexion: AAROM, Left, 10 reps, Supine Other Exercises Other Exercises: LUE digit AAROM to address finger extension Other Exercises: AROM LUE supination/pronation    Shoulder Instructions  General Comments      Pertinent Vitals/ Pain       Pain Assessment Pain Assessment: Faces Faces Pain Scale: No hurt Pain Intervention(s): Monitored during session  Home Living                                          Prior Functioning/Environment              Frequency  Min 2X/week        Progress Toward Goals  OT Goals(current goals can now be found in the care plan section)  Progress towards OT goals: Progressing toward goals  Acute Rehab OT Goals Patient Stated Goal: get better OT Goal Formulation: With patient Time For Goal Achievement: 04/13/22 Potential to Achieve Goals: Good ADL Goals Pt Will Perform Upper Body Bathing: with set-up;sitting Pt Will Perform Lower Body Bathing: with mod assist;sit to/from stand Pt Will  Perform Upper Body Dressing: with set-up;sitting Pt Will Perform Lower Body Dressing: with mod assist;sit to/from stand Pt Will Transfer to Toilet: with min assist;bedside commode Pt Will Perform Toileting - Clothing Manipulation and hygiene: sitting/lateral leans;with min assist  Plan Discharge plan remains appropriate    Co-evaluation                 AM-PAC OT "6 Clicks" Daily Activity     Outcome Measure   Help from another person eating meals?: A Little Help from another person taking care of personal grooming?: A Little Help from another person toileting, which includes using toliet, bedpan, or urinal?: A Lot Help from another person bathing (including washing, rinsing, drying)?: A Lot Help from another person to put on and taking off regular upper body clothing?: A Lot Help from another person to put on and taking off regular lower body clothing?: A Lot 6 Click Score: 14    End of Session    OT Visit Diagnosis: Unsteadiness on feet (R26.81);Other symptoms and signs involving the nervous system (R29.898)   Activity Tolerance Patient tolerated treatment well   Patient Left in bed;with call bell/phone within reach   Nurse Communication Mobility status        Time: 4403-4742 OT Time Calculation (min): 16 min  Charges: OT General Charges $OT Visit: 1 Visit OT Treatments $Therapeutic Exercise: 8-22 mins  Lodema Hong, Junior  Office [place] 04/03/2022, 3:05 PM

## 2022-04-03 NOTE — Progress Notes (Signed)
Sharon Heys, MD  Physician Physical Medicine and Rehabilitation   PMR Pre-admission    Signed   Date of Service: 04/03/2022  8:02 AM  Related encounter: Admission (Discharged) from 03/27/2022 in [facility]   Signed      Show:Clear all _0 Written_1 Templated_2 Copied  Added by: _3 Cristina Gong, RN_4 Sharon Heys, MD  _5 Hover for details PMR Admission Coordinator Pre-Admission Assessment   Patient: Sharon Odom is an 51 y.o., female MRN: [id number] DOB: Sep 13, 1970 Height: _6  (175.3 cm) Weight: 56.2 kg   Insurance Information HMO:     PPO:      PCP:      IPA:      80/20:      OTHER:  PRIMARY: [place]      Policy#: [id number]      Subscriber: spouse CM Name: faxed approval  from Care management    Phone#: [phone]     Fax#: [phone] Pre-Cert#: [id number] Z approved for 12 days       Employer: [employer] Benefits:  Phone #: [phone]     Name: 11/6 Eff. Date: 05/28/16     Deduct: $4000      Out of Pocket Max: $8000      Life Max: none CIR: 80%      SNF: 80% 130 days Outpatient: 80%     Co-Pay: visits per medical neccesity Home Health: 80%      Co-Pay: 130 combined visits DME: 80%     Co-Pay: 20% Providers: in network  SECONDARY: Medicare part A only policy # [id number]   Financial Counselor:       Phone#:    The "Data Collection Information Summary" for patients in Inpatient Rehabilitation Facilities with attached "Privacy Act [place] Records" was provided and verbally reviewed with: Patient   Emergency Contact Information Contact Information       Name Relation Home Work Mobile    [place] Spouse     [place]     [phone]         Current Medical History  Patient Admitting Diagnosis: Acute Metabolic encephalopathy   History of Present Illness: 51 year old female with history of thrombocytopenia from [place], anxiety, asthma and chronic pain,  progressive MS , s/p IT pump replacement 8/23. Initially diagnosis with MS 2013. IT pump removed 04/06/21 due to pump infection. Recent admission to [facility] 03/13/22 to fine tune ITB pump as could not be done in visits to outpatient clinic as well as rehabilitation to improve her overall level of function.    [name], she had progressive weakness. Neuro lt consulted and recommended high dose IV steroids. MRI showed new demyelinating lesions. Patient did develop fever with U/A positive for Klebsiella pneumoniae and enterobacter . [name]. Readmitted to acute hospital on 03/27/22.   Neurology consult. Felt MRI showed faint punctuate enhancing lesions on a background of lesions consistent with MS, but lesion did not explain current left sided weakness. Patient has been on multiple sedating medications. Felt likely infection causing worsening of pre-existing deficits, as well as oversedation from multiple sedating meds. Steroids placed on hold, and sedating meds decreased. Antibiotic changed to Zosyn for 5 day course in the setting of encephalopathy.    Palliative care consulted to begin goals of care discussions for future recommendations.   Patient's medical record from [facility] has been reviewed by the rehabilitation admission coordinator and physician.  Past Medical History      Past Medical History:  Diagnosis Date   Allergies     Anxiety     Arthritis     Asthma      seasonal - pollen   Constipation     Depression     Dyspnea      with exertion   GERD (gastroesophageal reflux disease)     Headache     History of hiatal hernia     Left radial nerve palsy 03/15/2020    resolved - Left arm/hand weak   Multiple sclerosis ([place])     Pneumonia 2007    x 1   Vision abnormalities     Wears glasses      Has the patient had major surgery during 100 days prior to admission? yes   Family History   family history includes Diabetes in her  mother; Healthy in her brother.   Current Medications   Current Facility-Administered Medications:    0.9 %  sodium chloride infusion, 250 mL, Intravenous, PRN, Norins, Heinz Knuckles, MD   acetaminophen (TYLENOL) tablet 650 mg, 650 mg, Oral, Q6H PRN, Norins, Heinz Knuckles, MD, 650 mg at 04/03/22 0305   amantadine (SYMMETREL) capsule 100 mg, 100 mg, Oral, TID, Norins, Heinz Knuckles, MD, 100 mg at 04/03/22 8088   antiseptic oral rinse (BIOTENE) solution 15 mL, 15 mL, Mouth Rinse, PRN, Norins, Heinz Knuckles, MD   ascorbic acid (VITAMIN C) tablet 1,000 mg, 1,000 mg, Oral, Daily, Norins, Heinz Knuckles, MD, 1,000 mg at 04/02/22 1047   baclofen (LIORESAL) intrathecal injection 40 mg/69m, 80 mg, Intrathecal, Once, Pham, Minh Q, RPH-CPP   baclofen (LIORESAL) tablet 40 mg, 40 mg, Oral, Q0600, 40 mg at 04/03/22 0554 **AND** baclofen (LIORESAL) tablet 20 mg, 20 mg, Oral, BID, Danford, CSuann Larry MD, 20 mg at 04/02/22 2111   bisacodyl (DULCOLAX) suppository 10 mg, 10 mg, Rectal, Daily PRN, Danford, CSuann Larry MD   buPROPion (WELLBUTRIN XL) 24 hr tablet 300 mg, 300 mg, Oral, Daily, Norins, MHeinz Knuckles MD, 300 mg at 04/02/22 1042   calcium carbonate (TUMS - dosed in mg elemental calcium) chewable tablet 200 mg of elemental calcium, 1 tablet, Oral, TID PRN, Norins, MHeinz Knuckles MD   Chlorhexidine Gluconate Cloth 2 % PADS 6 each, 6 each, Topical, Daily, Danford, CSuann Larry MD, 6 each at 03/31/22 1321   cholecalciferol (VITAMIN D3) 25 MCG (1000 UNIT) tablet 5,000 Units, 5,000 Units, Oral, Daily, Norins, MHeinz Knuckles MD, 5,000 Units at 04/02/22 1042   cyanocobalamin (VITAMIN B12) tablet 2,500 mcg, 2,500 mcg, Oral, Daily, Norins, MHeinz Knuckles MD, 2,500 mcg at 04/02/22 1041   dalfampridine TB12 10 mg, 10 mg, Oral, BID, Norins, MHeinz Knuckles MD, 10 mg at 04/03/22 0554   diazepam (VALIUM) tablet 5 mg, 5 mg, Oral, Q8H, Danford, CSuann Larry MD, 5 mg at 04/03/22 0554   docusate sodium (COLACE) capsule 100 mg, 100 mg, Oral, QHS, Norins,  MHeinz Knuckles MD, 100 mg at 03/31/22 2103   enoxaparin (LOVENOX) injection 40 mg, 40 mg, Subcutaneous, Q24H, Norins, MHeinz Knuckles MD, 40 mg at 04/02/22 1809   fiber (NUTRISOURCE FIBER) 1 packet, 1 packet, Oral, Daily, MWilson SingerI, RPH, 1 packet at 04/02/22 1042   naproxen (NAPROSYN) tablet 500 mg, 500 mg, Oral, BID WC, Norins, MHeinz Knuckles MD, 500 mg at 04/03/22 01103  oxyCODONE (Oxy IR/ROXICODONE) immediate release tablet 10 mg, 10 mg, Oral, Q4H PRN, DEdwin Dada MD, 10 mg at 04/03/22 0706   polyethylene  glycol (MIRALAX / GLYCOLAX) packet 17 g, 17 g, Oral, Daily, Norins, Heinz Knuckles, MD, 17 g at 04/02/22 1042   polyvinyl alcohol (LIQUIFILM TEARS) 1.4 % ophthalmic solution 1 drop, 1 drop, Both Eyes, TID PRN, Wilson Singer I, RPH   potassium chloride SA (KLOR-CON M) CR tablet 20 mEq, 20 mEq, Oral, QODAY, Norins, Heinz Knuckles, MD, 20 mEq at 04/02/22 1041   Psyllium CAPS 5 capsule, 5 capsule, Oral, Daily, Norins, Heinz Knuckles, MD, 5 capsule at 04/02/22 1041   senna-docusate (Senokot-S) tablet 3 tablet, 3 tablet, Oral, Daily, Norins, Heinz Knuckles, MD, 3 tablet at 04/02/22 1041   sodium chloride flush (NS) 0.9 % injection 3 mL, 3 mL, Intravenous, Q12H, Norins, Heinz Knuckles, MD, 3 mL at 04/02/22 2112   sodium chloride flush (NS) 0.9 % injection 3 mL, 3 mL, Intravenous, PRN, Norins, Heinz Knuckles, MD   tiZANidine (ZANAFLEX) tablet 4 mg, 4 mg, Oral, BID, Danford, Suann Larry, MD, 4 mg at 04/02/22 2112   traZODone (DESYREL) tablet 50 mg, 50 mg, Oral, QHS, Danford, Suann Larry, MD   trospium (SANCTURA) tablet 20 mg, 20 mg, Oral, 2 times per day, Danford, Suann Larry, MD, 20 mg at 04/03/22 0554   Patients Current Diet:  Diet Order                  Diet regular Room service appropriate? Yes; Fluid consistency: Thin  Diet effective now                       Precautions / Restrictions Precautions Precautions: Fall Other Brace: Hinge brace for Lt knee- need clarification of  wear Restrictions Weight Bearing Restrictions: No    Has the patient had 2 or more falls or a fall with injury in the past year? No   Prior Activity Level Household: uses power wheelchair, limited transfers and mobility. Uses power wheelchair with all activity. Sleeps in chair or on couch. Can drive wheelchair into handicapped shower and transfers to shower bench. Uses BSC transfer for bowel program. Has vertical power lift in garage to go from power wheelchair to car. Uses manuel wheelchair in the community for appointments. Spouse lifts her onto couch, into car, etc as she has not stood safely for months.    Prior Functional Level Self Care: Did the patient need help bathing, dressing, using the toilet or eating? Needed some help   Indoor Mobility: Did the patient need assistance with walking from room to room (with or without device)? Needed some help   Stairs: Did the patient need assistance with internal or external stairs (with or without device)? Needed some help   Functional Cognition: Did the patient need help planning regular tasks such as shopping or remembering to take medications? Needed some help   Patient Information Are you of Hispanic, Latino/a,or Spanish origin?: A. No, not of Hispanic, Latino/a, or Spanish origin What is your race?: A. White Do you need or want an interpreter to communicate with a doctor or health care staff?: 0. No   Patient's Response To:  Health Literacy and Transportation Is the patient able to respond to health literacy and transportation needs?: Yes Health Literacy - How often do you need to have someone help you when you read instructions, pamphlets, or other written material from your doctor or pharmacy?: Never In the past 12 months, has lack of transportation kept you from medical appointments or from getting medications?: No In the past 12 months, has lack of transportation  kept you from meetings, work, or from getting things needed for daily  living?: No   Development worker, international aid / [place] Devices/Equipment: Other (Comment) (power wheelchair) Home Equipment: Grab bars - tub/shower, Grab bars - toilet, Rolling Walker (2 wheels), Rollator (4 wheels), Wheelchair - manual, Shower seat - built in, [place] - single point   Prior Device Use: Indicate devices/aids used by the patient prior to current illness, exacerbation or injury?  Power wheelchair   Current Functional Level Cognition   Overall Cognitive Status: History of cognitive impairments - at baseline Orientation Level: Oriented X4 General Comments: pt with decreased memory and awareness of sequence of events since admission    Extremity Assessment (includes Sensation/Coordination)   Upper Extremity Assessment: LUE deficits/detail LUE Deficits / Details: Shoulder 2/5, biceps/triceps 3/5, grip 3+/5, finger extension 3/5. LUE Sensation: decreased light touch LUE Coordination: decreased fine motor, decreased gross motor  Lower Extremity Assessment: Defer to PT evaluation LLE Deficits / Details: trace movement throughout LLE with functional assessment, not formally assessed with individual MMT today.     ADLs   Overall ADL's : Needs assistance/impaired Eating/Feeding: Set up, Sitting Grooming: Wash/dry hands, Wash/dry face, Oral care, Sitting, Minimal assistance Upper Body Bathing: Minimal assistance, Sitting Lower Body Bathing: Maximal assistance, Sit to/from stand, +2 for physical assistance Upper Body Dressing : Moderate assistance, Sitting Lower Body Dressing: Maximal assistance, +2 for physical assistance, Sit to/from stand Toilet Transfer: Moderate assistance, +2 for physical assistance, Stand-pivot, BSC/3in1, Standard walker Toilet Transfer Details (indicate cue type and reason): used knee brace Toileting- Clothing Manipulation and Hygiene: Maximal assistance, Sit to/from stand Functional mobility during ADLs: Moderate assistance, +2 for physical  assistance (transfers) General ADL Comments: Pt limited with adls due to L side weakness, poor sitting balance and inability to stand without signficant assist.     Mobility   Overal bed mobility: Needs Assistance Bed Mobility: Supine to Sit Rolling: Min guard Sidelying to sit: Mod assist Supine to sit: Min guard General bed mobility comments: HOB 30 degrees with use of rail and leg lifter to move LLE. Pt able to transition to sitting without physical assist with increased time. Guarding for sitting balance with cues for posture as pt with tendency for right lateral flexion     Transfers   Overall transfer level: Needs assistance Equipment used: Rolling walker (2 wheels) Transfers: Sit to/from Stand, Bed to chair/wheelchair/BSC Sit to Stand: Min assist Bed to/from chair/wheelchair/BSC transfer type:: Stand pivot Stand pivot transfers: Mod assist Squat pivot transfers: Mod assist, +2 safety/equipment General transfer comment: pt able to stand from bed, bSC and chair x 6 total trials with min assist, cues for hand placement. Assist to rise and stabilize. Pivot bed<>bsc with RW mod assist for balance and control of RW     Ambulation / Gait / Stairs / Wheelchair Mobility   Ambulation/Gait Ambulation/Gait assistance: Mod assist, +2 safety/equipment Gait Distance (Feet): 12 Feet Assistive device: Rolling walker (2 wheels) Gait Pattern/deviations: Step-to pattern, Narrow base of support General Gait Details: pt with narrow BOS with reliance on bil UE use on RW to off weight lower body for stepping. Pt vaulting LLE with stepping due to foot drop. Physical assist to shift RW and for balance. Pt with limited awareness of fatigue with chair follow. Pt walked 8' then 12'. Gait velocity interpretation: <1.31 ft/sec, indicative of household ambulator     Posture / Balance Dynamic Sitting Balance Sitting balance - Comments: minguard EOB and at [facility] with and without UE  support Balance Overall balance  assessment: Needs assistance Sitting-balance support: Single extremity supported, Feet supported Sitting balance-Leahy Scale: Fair Sitting balance - Comments: minguard EOB and at [facility] with and without UE support Postural control: Right lateral lean Standing balance support: Bilateral upper extremity supported, Reliant on assistive device for balance Standing balance-Leahy Scale: Poor Standing balance comment: physical assist and Rw for balance     Special needs/care consideration Chronic indwelling catheter    Previous Home Environment  Living Arrangements: Spouse/significant other  Lives With: Spouse Available Help at Discharge: Family, Available PRN/intermittently Type of Home: House Home Layout: Two level, Able to live on main level with bedroom/bathroom Alternate Level Stairs-Number of Steps: 16-18 Home Access: Other (comment) Bathroom Shower/Tub: Other (comment) Bathroom Toilet: Handicapped height Bathroom Accessibility: Yes How Accessible: Accessible via wheelchair, Accessible via walker [place]: Yes Type of [place] (if known): [facility] Additional Comments: was getting HHPT prior to inital admssion   Discharge Living Setting Plans for Discharge Living Setting: Patient's home, Lives with (comment) (spouse) Type of Home at Discharge: House Discharge Home Layout: Two level, Able to live on main level with bedroom/bathroom Alternate Level Stairs-Number of Steps: flight Discharge Home Access: Ramped entrance Discharge Bathroom Shower/Tub: Walk-in shower (roll in shower) Discharge Bathroom Toilet: Handicapped height Discharge Bathroom Accessibility: Yes How Accessible: Accessible via wheelchair Does the patient have any problems obtaining your medications?: No   Social/Family/Support Systems Patient Roles: Spouse Contact Information: spouse, Shanon Brow Anticipated Caregiver: spouse Anticipated Ambulance person Information: see  contacts Ability/Limitations of Caregiver: spouse works Building control surveyor Availability: Intermittent Discharge Plan Discussed with Primary Caregiver: Yes Is Caregiver In Agreement with Plan?: Yes Does Caregiver/Family have Issues with Lodging/Transportation while Pt is in Rehab?: Yes   Goals Patient/Family Goal for Rehab: Mod I to intermittent supervision to min assist with PT and OT at wheelchair level, Mod I with SLP Expected length of stay: ELOS 10 to 12 days Pt/Family Agrees to Admission and willing to participate: Yes Program Orientation Provided & Reviewed with Pt/Caregiver Including Roles  & Responsibilities: Yes  Barriers to Discharge: Decreased caregiver support   Decrease burden of Care through IP rehab admission: n/a   Possible need for SNF placement upon discharge: not anticipated   Patient Condition: I have reviewed medical records from [facility], spoken with patient and spouse. I met with patient at the bedside for inpatient rehabilitation assessment.  Patient will benefit from ongoing PT, OT, and SLP, can actively participate in 3 hours of therapy a day 5 days of the week, and can make measurable gains during the admission.  Patient will also benefit from the coordinated team approach during an Inpatient Acute Rehabilitation admission.  The patient will receive intensive therapy as well as Rehabilitation physician, nursing, social worker, and care management interventions.  Due to bladder management, bowel management, safety, skin/wound care, disease management, medication administration, pain management, and patient education the patient requires 24 hour a day rehabilitation nursing.  The patient is currently mod assist overall with mobility and basic ADLs.  Discharge setting and therapy post discharge at home with home health is anticipated.  Patient has agreed to participate in the Acute Inpatient Rehabilitation Program and will admit today.   Preadmission Screen Completed By:   Cleatrice Burke, 04/03/2022 10:03 AM ______________________________________________________________________   Discussed status with Dr. Dagoberto Ligas on 04/03/22 at 1004 and received approval for admission today.   Admission Coordinator:  Cleatrice Burke, RN, time 1004 Date 04/03/22  Assessment/Plan: Diagnosis: Does the need for close, 24 hr/day Medical supervision in concert with the patient's rehab needs make it unreasonable for this patient to be served in a less intensive setting? Yes Co-Morbidities requiring supervision/potential complications: Secondary progressive MS; UTI; w/c dependence; spasticity, chronic nerve/pain Due to bladder management, bowel management, safety, skin/wound care, disease management, medication administration, pain management, and patient education, does the patient require 24 hr/day rehab nursing? Yes Does the patient require coordinated care of a physician, rehab nurse, PT, OT, and SLP to address physical and functional deficits in the context of the above medical diagnosis(es)? Yes Addressing deficits in the following areas: balance, endurance, locomotion, strength, transferring, bowel/bladder control, bathing, dressing, feeding, grooming, and toileting Can the patient actively participate in an intensive therapy program of at least 3 hrs of therapy 5 days a week? Yes The potential for patient to make measurable gains while on inpatient rehab is good and fair Anticipated functional outcomes upon discharge from inpatient rehab: supervision and min assist PT, supervision and min assist OT, n/a SLP Estimated rehab length of stay to reach the above functional goals is: 10-12 days Anticipated discharge destination: Home 10. Overall Rehab/Functional Prognosis: good and fair     MD Signature:           Revision History

## 2022-04-03 NOTE — Progress Notes (Signed)
                                                     Palliative Care Progress Note, Assessment & Plan   Patient Name: Sharon Odom       Date: 04/03/2022 DOB: 1970-09-28  Age: 51 y.o. MRN#: [id number] Attending Physician: Edwin Dada, * Primary Care Physician: Oren Section, NP-C Admit Date: 03/27/2022  Reason for Consultation/Follow-up: Establishing goals of care  Summary of counseling/coordination of care: After reviewing the patient's chart and assessing the patient at bedside, I discussed plan of care with patient.  I shared the details of the discussion I had with her husband yesterday over the phone.  She shares that she has spoken with her husband and that he says that talking about "this kind of stuff is really hard".  We reviewed there is no urgent nature but that outlining goals of care and setting boundaries in place can be useful moving forward - especially given patient's new baseline.  She says she wants to talk about it with her husband not only for her wishes to be known but to know wishes for him. She says anything can happen at any time.   PMT remains available to patient and family to help facilitate these discussions when appropriate.   During our discussion, rehab administrator Pamala Hurry entered and shared that patient has been approved by insurance to transfer to CIR.    Plan is for patient to transfer to CIR later today.  PMT will continue to follow the patient and will touch base with her once settled in CIR.  Physical Exam Vitals reviewed.  Constitutional:      General: She is not in acute distress.    Appearance: Normal appearance. She is not toxic-appearing.  HENT:     Head: Normocephalic.     Mouth/Throat:     Mouth: Mucous membranes are moist.  Eyes:     Pupils: Pupils are equal, round, and reactive to light.   Cardiovascular:     Rate and Rhythm: Normal rate.     Pulses: Normal pulses.  Pulmonary:     Effort: Pulmonary effort is normal.  Abdominal:     Palpations: Abdomen is soft.  Musculoskeletal:     Comments: Left sided weakness  Skin:    General: Skin is warm and dry.  Neurological:     Mental Status: She is alert and oriented to person, place, and time.  Psychiatric:        Mood and Affect: Mood normal.        Behavior: Behavior normal.        Thought Content: Thought content normal.        Judgment: Judgment normal.            Total Time 25 minutes  Greater than 50%  of this time was spent counseling and coordinating care related to the above assessment and plan.  Thank you for allowing the Palliative Medicine Team to assist in the care of this patient.  Helen Ilsa Iha, FNP-BC Palliative Medicine Team Team Phone # [phone]

## 2022-04-03 NOTE — Plan of Care (Signed)
  Problem: Education: Goal: Knowledge of General Education information will improve Description: Including pain rating scale, medication(s)/side effects and non-pharmacologic comfort measures Outcome: Progressing   Problem: Health Behavior/Discharge Planning: Goal: Ability to manage health-related needs will improve Outcome: Progressing   Problem: Clinical Measurements: Goal: Ability to maintain clinical measurements within normal limits will improve Outcome: Progressing Goal: Will remain free from infection Outcome: Progressing   Problem: Activity: Goal: Risk for activity intolerance will decrease Outcome: Progressing   Problem: Nutrition: Goal: Adequate nutrition will be maintained Outcome: Progressing   Problem: Coping: Goal: Level of anxiety will decrease Outcome: Progressing   Problem: Elimination: Goal: Will not experience complications related to bowel motility Outcome: Progressing   Problem: Pain Managment: Goal: General experience of comfort will improve Outcome: Progressing   Problem: Skin Integrity: Goal: Risk for impaired skin integrity will decrease Outcome: Progressing   Problem: Education: Goal: Ability to describe self-care measures that may prevent or decrease complications (Diabetes Survival Skills Education) will improve Outcome: Progressing Goal: Individualized Educational Video(s) Outcome: Progressing   Problem: Coping: Goal: Ability to adjust to condition or change in health will improve Outcome: Progressing   Problem: Fluid Volume: Goal: Ability to maintain a balanced intake and output will improve Outcome: Progressing   Problem: Health Behavior/Discharge Planning: Goal: Ability to identify and utilize available resources and services will improve Outcome: Progressing Goal: Ability to manage health-related needs will improve Outcome: Progressing   Problem: Metabolic: Goal: Ability to maintain appropriate glucose levels will  improve Outcome: Progressing   Problem: Nutritional: Goal: Maintenance of adequate nutrition will improve Outcome: Progressing Goal: Progress toward achieving an optimal weight will improve Outcome: Progressing   Problem: Skin Integrity: Goal: Risk for impaired skin integrity will decrease Outcome: Progressing   Problem: Tissue Perfusion: Goal: Adequacy of tissue perfusion will improve Outcome: Progressing   

## 2022-04-03 NOTE — H&P (Signed)
Physical Medicine and Rehabilitation Admission H&P    CC:  Debility due to secondary progressive multiple sclerosis  HPI: Sharon Odom is a 51 year old female with a history of secondary progressive multiple sclerosis. She has had significant left greater than right spasticity with subsequent implantation of intrathecal pump for baclofen administration. She underwent revision of the abdominal portion of the pump by Dr. Jake Samples on 01/11/2022. At the time of follow up on 10/6, she reported worsening pain especially when sitting despite increase in 5% increase in baclofen pump. She had been taking oral baclofen 20 mg six/day, oxycodone 10 mg six/day and on Dantrolene 100 mg BID. She was admitted to inpatient rehab on 03/13/2022. Pain medication and baclofen orally and IT were adjusted. Locking knee brace placed to left knee. Call to Dr. Epimenio Foot on 10/25 and amantadine started 100 mg TID. Foley cath removed and replaced and urine culture reveled Klebsiella and Enterobacter. Macrodantin was discontinued and she was started on cefepime due to fever spike to 101.2 on 10/28. Nucynta started. BC negative. MRI performed on 10/30 showed acute demyelination and Solumedrol 1000 mg daily for 5 days started on 10/31. Unable to participate in therapy therefore transferred to acute hospital bed.   Neurology, Dr. Otelia Limes was consulted. Diagnosed with acute toxic and metabolic encephalopathy from dehydration, UTI and medication excess, possible from cefepime administration. Felt to be pseudo-exacerbation and findings on MRI did not explain her symptoms. Dantrolene, Lamictal discontinued. Antibiotic changed to Zosyn and she completed 5 days of therapy. Palliative care consultation obtained on 11/3. They will continue to follow. She is currently min guard with bed mobility, min assist with transfers, mod assist +2 for ambulation/gait. Reliance on BUE use on RW to off weight lower body for stepping. The patient requires  inpatient physical medicine and rehabilitation evaluations and treatment secondary to dysfunction due to secondary progressive multiple sclerosis.  She follows with Dr. Despina Arias, [facility]. Last [place] in 2021.    Pt asking to not wear pillow boot for heel where she got pressure ulcer, since says it's "painful".  Feels really stiff and tight "all over". Also c/o prickly pain all over- and stinging pain- which I explained is NERVE pain, not somatic pain.  Even upon explaining differences, I'm not sure she understands.  LBM yesterday however it was after to SSE for 2 dqays and a mineral oil enema on 3rd day.    Review of Systems  Constitutional:  Positive for malaise/fatigue and weight loss. Negative for chills and fever.  HENT: Negative.    Eyes:  Positive for blurred vision. Negative for double vision and pain.  Respiratory:  Negative for cough and shortness of breath.   Cardiovascular:  Negative for chest pain, palpitations and leg swelling.  Gastrointestinal:        Has been cleaned out with mineral enema, but had been constipated- severely  Genitourinary: Negative.   Musculoskeletal:  Positive for back pain, joint pain and myalgias.  Skin:        2 new pressure ulcers  Neurological:  Positive for tingling, sensory change and weakness. Negative for seizures and loss of consciousness.  Endo/Heme/Allergies: Negative.   Psychiatric/Behavioral:  Positive for depression. Negative for hallucinations. The patient has insomnia. The patient is not nervous/anxious.   All other systems reviewed and are negative.   Past Surgical History:  Procedure Laterality Date   COLONOSCOPY     EXCISION MORTON'S NEUROMA Right    fallopian tube removal  INTRATHECAL PUMP IMPLANT Right 02/18/2019   Procedure: INTRATHECAL PUMP IMPLANT;  Surgeon: Odette Fraction, MD;  Location: [facility] OR;  Service: Neurosurgery;  Laterality: Right;  INTRATHECAL PUMP IMPLANT   INTRATHECAL PUMP IMPLANT  Right 02/17/2021   Procedure: Baclofen Pump Replacement;  Surgeon: Maeola Harman, MD;  Location: [facility];  Service: Neurosurgery;  Laterality: Right;  3C/RM 21   INTRATHECAL PUMP REVISION N/A 01/11/2022   Procedure: Baclofen Pump revision;  Surgeon: Dawley, Alan Mulder, DO;  Location: [facility] OR;  Service: Neurosurgery;  Laterality: N/A;  RM 21 to follow   PAIN PUMP IMPLANTATION N/A 09/28/2021   Procedure: Insertion of baclofen pump, right lower quadrant;  Surgeon: Dawley, Alan Mulder, DO;  Location: [facility] OR;  Service: Neurosurgery;  Laterality: N/A;   PAIN PUMP REMOVAL Left 04/06/2021   Procedure: Removal of baclofen pump, Left;  Surgeon: Dawley, Alan Mulder, DO;  Location: [facility] OR;  Service: Neurosurgery;  Laterality: Left;  Lateral/Left//3C rm 19   UPPER GI ENDOSCOPY     WISDOM TOOTH EXTRACTION     WISDOM TOOTH EXTRACTION     Family History  Problem Relation Age of Onset   Diabetes Mother    Healthy Brother    Social History:  reports that she has been smoking cigarettes. She started smoking about 35 years ago. She has a 16.00 pack-year smoking history. She has never used smokeless tobacco. She reports that she does not currently use alcohol. She reports that she does not use drugs. Allergies:  Allergies  Allergen Reactions   Ziconotide Acetate Shortness Of Breath    Anorexia, weird sensations, could not talk, choking feeling, lost since of taste and smell. Hallucinations, vivid dreams. Confusion and sleepiness. N/v, increase in pain.  Anorexia, weird sensations, could not talk, choking feeling, lost since of taste and smell. Hallucinations, vivid dreams. Confusion and sleepiness. N/v, increase in pain.    Morphine Other (See Comments)    Pt does not recall reaction    Amoxicillin Nausea And Vomiting   Dantrolene Other (See Comments)    Muscle pain   Lamotrigine Other (See Comments)    Join pain   Lyrica [Pregabalin] Nausea And Vomiting   Medications Prior to Admission  Medication Sig Dispense Refill    albuterol (VENTOLIN HFA) 108 (90 Base) MCG/ACT inhaler Inhale 1-2 puffs into the lungs every 6 (six) hours as needed for wheezing or shortness of breath. 6.7 g 1   amantadine (SYMMETREL) 100 MG capsule Take 1 capsule (100 mg total) by mouth 3 (three) times daily. 60 capsule 0   antiseptic oral rinse (BIOTENE) LIQD 15 mLs by Mouth Rinse route as needed for dry mouth.     Ascorbic Acid (VITAMIN C) 1000 MG tablet Take 1,000 mg by mouth daily.     baclofen (LIORESAL) 20 MG tablet Take 2 tablets (40 mg total) by mouth daily at 2 PM. 30 each 0   baclofen (LIORESAL) 20 MG tablet Take 1-2 tablets (20-40 mg total) by mouth 2 (two) times daily. 30 each 0   buPROPion (WELLBUTRIN XL) 300 MG 24 hr tablet TAKE 1 TABLET DAILY 90 tablet 3   calcium carbonate (TUMS - DOSED IN MG ELEMENTAL CALCIUM) 500 MG chewable tablet Chew 1 tablet (200 mg of elemental calcium total) by mouth 3 (three) times daily as needed for indigestion or heartburn.     carboxymethylcellulose (REFRESH PLUS) 0.5 % SOLN Place 1 drop into both eyes 3 (three) times daily as needed (dry eyes).     Cholecalciferol (VITAMIN D-3)  125 MCG (5000 UT) TABS Take 5,000 Units by mouth daily. 30 tablet 1   conjugated estrogens (PREMARIN) vaginal cream Place 1 Application vaginally 3 (three) times a week. Blueberry size amount for application (Patient not taking: Reported on 03/13/2022)     Cyanocobalamin (VITAMIN B-12) 2500 MCG SUBL Place 1 tablet (2,500 mcg total) under the tongue daily. 30 tablet 0   cyproheptadine (PERIACTIN) 4 MG tablet      dalfampridine 10 MG TB12 Take 1 tablet (10 mg total) by mouth 2 (two) times daily. One po bid 180 tablet 3   diazepam (VALIUM) 5 MG tablet Take 1.5 tablets (7.5 mg total) by mouth every 8 (eight) hours. 140 tablet 5   Docusate Sodium (DSS) 100 MG CAPS Take by mouth.     furosemide (LASIX) 40 MG tablet Take 2 tablets (80 mg total) by mouth daily. 30 tablet 0   Green Tea 150 MG CAPS Green Tea Complex  500 mg daily      methadone (DOLOPHINE) 5 MG tablet Take 1 tablet (5 mg total) by mouth every 12 (twelve) hours. 60 tablet 0   methocarbamol (ROBAXIN) 500 MG tablet Take 1 tablet (500 mg total) by mouth every 6 (six) hours as needed for muscle spasms. 30 tablet 0   methylcellulose (CITRUCEL) oral powder Take by mouth.     montelukast (SINGULAIR) 10 MG tablet TAKE 1 TABLET DAILY (Patient not taking: Reported on 03/13/2022) 90 tablet 3   naproxen (NAPROSYN) 500 MG tablet Take 1 tablet (500 mg total) by mouth 2 (two) times daily with a meal.     polyethylene glycol (MIRALAX / GLYCOLAX) 17 g packet Take 17 g by mouth daily. 14 each 0   potassium chloride SA (KLOR-CON M) 20 MEQ tablet Take 1 tablet (20 mEq total) by mouth every other day while taking Lasix 15 tablet 0   Psyllium (METAMUCIL) 0.36 g CAPS Take 5 capsules by mouth daily.     ranitidine (ZANTAC) 300 MG capsule Take by mouth. (Patient not taking: Reported on 03/13/2022)     senna-docusate (SENOKOT-S) 8.6-50 MG tablet Take 3 tablets by mouth daily.     tiZANidine (ZANAFLEX) 4 MG tablet TAKE 2 TABLETS THREE TIMES A DAY (Patient taking differently: Take 8 mg by mouth 3 (three) times daily.) 720 tablet 3   trospium (SANCTURA) 20 MG tablet Take 20 mg by mouth 2 (two) times daily.     vitamin A 3 MG (10000 UNITS) capsule Take by mouth. (Patient not taking: Reported on 03/13/2022)        Home: Home Living Family/patient expects to be discharged to:: Private residence Living Arrangements: Spouse/significant other Available Help at Discharge: Family, Available PRN/intermittently Type of Home: House Home Access: Other (comment) Home Layout: Two level, Able to live on main level with bedroom/bathroom Alternate Level Stairs-Number of Steps: 16-18 Bathroom Shower/Tub: Other (comment) Bathroom Toilet: Handicapped height Bathroom Accessibility: Yes Home Equipment: Grab bars - tub/shower, Grab bars - toilet, Rolling Walker (2 wheels), Rollator (4 wheels),  Wheelchair - manual, Shower seat - built in, [place] - single point Additional Comments: was getting HHPT prior to inital [place]  Lives With: Spouse   Functional History: Prior Function Prior Level of Function : Needs assist Physical Assist : Mobility (physical), ADLs (physical) Mobility (physical): Gait, Stairs ADLs (physical): Bathing, Dressing, Toileting, IADLs Mobility Comments: Pt transferred self; walking with assistive device without issues early in summer but not since then. ADLs Comments: independent with most adls at w/c level but started  requiring more assist right before admission.  Functional Status:  Mobility: Bed Mobility Overal bed mobility: Needs Assistance Bed Mobility: Supine to Sit Rolling: Min guard Sidelying to sit: Mod assist Supine to sit: Min guard General bed mobility comments: HOB 30 degrees with use of rail and leg lifter to move LLE. Pt able to transition to sitting without physical assist with increased time. Guarding for sitting balance with cues for posture as pt with tendency for right lateral flexion Transfers Overall transfer level: Needs assistance Equipment used: Rolling walker (2 wheels) Transfers: Sit to/from Stand, Bed to chair/wheelchair/BSC Sit to Stand: Min assist Bed to/from chair/wheelchair/BSC transfer type:: Stand pivot Stand pivot transfers: Mod assist Squat pivot transfers: Mod assist, +2 safety/equipment General transfer comment: pt able to stand from bed, bSC and chair x 6 total trials with min assist, cues for hand placement. Assist to rise and stabilize. Pivot bed<>bsc with RW mod assist for balance and control of RW Ambulation/Gait Ambulation/Gait assistance: Mod assist, +2 safety/equipment Gait Distance (Feet): 12 Feet Assistive device: Rolling walker (2 wheels) Gait Pattern/deviations: Step-to pattern, Narrow base of support General Gait Details: pt with narrow BOS with reliance on bil UE use on RW to off weight lower body for  stepping. Pt vaulting LLE with stepping due to foot drop. Physical assist to shift RW and for balance. Pt with limited awareness of fatigue with chair follow. Pt walked 8' then 12'. Gait velocity interpretation: <1.31 ft/sec, indicative of household ambulator    ADL: ADL Overall ADL's : Needs assistance/impaired Eating/Feeding: Set up, Sitting Grooming: Wash/dry hands, Wash/dry face, Oral care, Sitting, Minimal assistance Upper Body Bathing: Minimal assistance, Sitting Lower Body Bathing: Maximal assistance, Sit to/from stand, +2 for physical assistance Upper Body Dressing : Moderate assistance, Sitting Lower Body Dressing: Maximal assistance, +2 for physical assistance, Sit to/from stand Toilet Transfer: Moderate assistance, +2 for physical assistance, Stand-pivot, BSC/3in1, Standard walker Toilet Transfer Details (indicate cue type and reason): used knee brace Toileting- Clothing Manipulation and Hygiene: Maximal assistance, Sit to/from stand Functional mobility during ADLs: Moderate assistance, +2 for physical assistance (transfers) General ADL Comments: Pt limited with adls due to L side weakness, poor sitting balance and inability to stand without signficant assist.  Cognition: Cognition Overall Cognitive Status: History of cognitive impairments - at baseline Orientation Level: Oriented X4 Cognition Arousal/Alertness: Awake/alert Behavior During Therapy: Flat affect Overall Cognitive Status: History of cognitive impairments - at baseline General Comments: pt with decreased memory and awareness of sequence of events since admission  Physical Exam: Blood pressure 107/67, pulse 77, temperature (!) 97.3 F (36.3 C), temperature source Oral, resp. rate 18, height 5\' 9"  (1.753 m), weight 56.2 kg, SpO2 100 %. Physical Exam Vitals and nursing note reviewed.  Constitutional:      General: She is not in acute distress.    Appearance: She is ill-appearing.     Comments: Appeared to  have lost weight; supine in bed, but can turn to L when directed; which is better than when she left CIR last week, has a lot of concerns today, NAD Power w/c in room  HENT:     Head: Normocephalic and atraumatic.     Right Ear: External ear normal.     Left Ear: External ear normal.     Nose: Nose normal. No congestion.     Mouth/Throat:     Mouth: Mucous membranes are dry.     Pharynx: Oropharynx is clear. No oropharyngeal exudate.  Eyes:     Comments: Pupils  very large B/L and responds to light  Cardiovascular:     Rate and Rhythm: Normal rate and regular rhythm.     Heart sounds: Normal heart sounds. No murmur heard.    No gallop.  Pulmonary:     Effort: Pulmonary effort is normal. No respiratory distress.     Breath sounds: Normal breath sounds. No wheezing, rhonchi or rales.  Abdominal:     Palpations: Abdomen is soft.     Comments: No distension- normoactive BS; NT; ITB pump on R side palpable  Musculoskeletal:     Cervical back: Neck supple.     Comments: Able to lift head and move LUE and head around more like her baseline RUE very strong- basically 5/5 Difficult to test on RLE due to tone LLE- back to baseline  Skin:    General: Skin is warm and dry.     Comments: New DTI on coccyx Also worsening skin open/pressure Stage II on posterior thigh- on R thigh Also DTI on heel  Neurological:     Mental Status: She is alert.     Comments: MAS of 3 in RLE with significant extensor tone MAS of 1+ in LLE with a few beats clonus on LLE and too tight to trigger clonus on RLE LUE- normal tone,- usually flaccid RUE- normal tone- normally normal   Psychiatric:     Comments: Extremely talkative about concerns today- didn't remember a few things I explained about pain meds and about nerve pain vs somatic pain by the end of conversation/interview     Results for orders placed or performed during the hospital encounter of 03/27/22 (from the past 48 hour(s))  CBC     Status:  Abnormal   Collection Time: 04/02/22  2:47 AM  Result Value Ref Range   WBC 8.8 4.0 - 10.5 K/uL   RBC 3.54 (L) 3.87 - 5.11 MIL/uL   Hemoglobin 10.9 (L) 12.0 - 15.0 g/dL   HCT 67.8 (L) 93.8 - 10.1 %   MCV 99.2 80.0 - 100.0 fL   MCH 30.8 26.0 - 34.0 pg   MCHC 31.1 30.0 - 36.0 g/dL   RDW 75.1 02.5 - 85.2 %   Platelets 306 150 - 400 K/uL   nRBC 0.0 0.0 - 0.2 %    Comment: Performed at [facility], [address], [place], [place] [address]  Comprehensive metabolic panel     Status: Abnormal   Collection Time: 04/02/22  2:47 AM  Result Value Ref Range   Sodium 141 135 - 145 mmol/L   Potassium 4.6 3.5 - 5.1 mmol/L   Chloride 109 98 - 111 mmol/L   CO2 27 22 - 32 mmol/L   Glucose, Bld 95 70 - 99 mg/dL    Comment: Glucose reference range applies only to samples taken after fasting for at least 8 hours.   BUN 12 6 - 20 mg/dL   Creatinine, Ser 2.35 0.44 - 1.00 mg/dL   Calcium 9.4 8.9 - 36.1 mg/dL   Total Protein 5.5 (L) 6.5 - 8.1 g/dL   Albumin 2.8 (L) 3.5 - 5.0 g/dL   AST 17 15 - 41 U/L   ALT 16 0 - 44 U/L   Alkaline Phosphatase 51 38 - 126 U/L   Total Bilirubin 0.4 0.3 - 1.2 mg/dL   GFR, Estimated >44 >31 mL/min    Comment: (NOTE) Calculated using the CKD-EPI Creatinine Equation (2021)    Anion gap 5 5 - 15    Comment:  Performed at [facility], [address], [place], [place] [address]  Creatinine, serum     Status: None   Collection Time: 04/03/22  5:46 AM  Result Value Ref Range   Creatinine, Ser 0.71 0.44 - 1.00 mg/dL   GFR, Estimated >82 >50 mL/min    Comment: (NOTE) Calculated using the CKD-EPI Creatinine Equation (2021) Performed at [facility], [address], [place], [place] [address]    No results found.    Blood pressure 107/67, pulse 77, temperature (!) 97.3 F (36.3 C), temperature source Oral, resp. rate 18, height 5\' 9"  (1.753 m), weight 56.2 kg, SpO2 100 %.  Medical Problem List and Plan: 1. Functional deficits due to secondary  progressive multiple sclerosis and debility from prolonged admission on acute floor for encephalopathy  -patient may  shower  -ELOS/Goals: min A to supervision- 7-10 days 2.  Antithrombotics: -DVT/anticoagulation:  Pharmaceutical: Lovenox  -antiplatelet therapy: none 3. Pain Management:   -Naproxen 500 mg twice daily  -Tylenol as needed  -Oxycodone 10 mg every 4 hours as needed- will change to Nucynta 50 mg q4 hours - will not add Methadone due to chance of polypharmacy that might have occurred last week in addition to Cefepime encephalopathy.  4. Mood/Behavior/Sleep: LCSW to evaluate and provide emotional support  -antipsychotic agents: n/a  -Wellbutrin XL 300 mg daily  -amantadine 100 mg TID- per Dr Epimenio Foot 5. Neuropsych/cognition: This patient is capable of making decisions on her own behalf. 6. Skin/Wound Care: Routine skin care checks 7. Fluids/Electrolytes/Nutrition: Routine Is and Os and follow-up chemistries  -continue Vitamin C, D3, B12 and fiber supplements 8: MS with spasticity: continue dalfampridine BID and [place]   -Baclofen 20 mg twice daily, 40 mg every morning  -Valium 5 mg every 8 hours  -Zanaflex 4 mg twice daily- were reduced due to cognition/sedation on acute- will change out /refill pump Thursday as well as increase dosing of ITB pump 9: GERD: restart Zantac 10: Neurogenic bladder with indwelling Foley; s/p UTI treatment with Zosyn 11: Bowel program: Nutrisource fiber, psyllium caps, Colace, MiraLax, Senekot-S given daily  -placed PRN sorbitol and Fleets orders- has gotten really constipated/needing multiple enemas-  12: Dysphagia: ? resolved; now on regular diet; SLP eval 13: Seasonal allergies off home meds: monitor 14: Tooth abscess: missed follow-up appointment; completed Flagyl 15: Hypokalemia: on K+ supplement QOD; follow-up BMP tomorrow- likely due to previous Lasix dose for Edema.    I have personally performed a face to face diagnostic evaluation of this  patient and formulated the key components of the plan.  Additionally, I have personally reviewed laboratory data, imaging studies, as well as relevant notes and concur with the physician assistant's documentation above.   The patient's status has not changed from the original H&P.  Any changes in documentation from the acute care chart have been noted above.     Milinda Antis, PA-C 04/03/2022

## 2022-04-03 NOTE — H&P (Signed)
ntibiotic changed to Zosyn and she completed 5 days of therapy. Palliative care consultation obtained on 11/3. They will continue to follow. She is currently min guard with bed mobility, min assist with transfers, mod assist +2 for ambulation/gait. Reliance on BUE use on RW to off weight lower body for stepping. The patient requires inpatient physical medicine and rehabilitation evaluations and treatment secondary to dysfunction due to secondary progressive multiple sclerosis.   She follows with Dr. Arlice Colt, [facility]. Last [place] in 2021.     Pt asking to not wear pillow boot for heel where she got pressure ulcer, since says it's "painful".  Feels really stiff and tight "all over". Also c/o prickly pain all over- and stinging pain- which I explained is NERVE pain, not somatic pain.  Even upon explaining differences, I'm not sure she understands.  LBM yesterday however it was after to SSE for 2 dqays and a mineral oil enema on 3rd day.      Review of Systems  Constitutional:  Positive for malaise/fatigue and weight loss. Negative for chills and fever.  HENT: Negative.    Eyes:  Positive for blurred vision. Negative for double vision and pain.  Respiratory:  Negative for cough and shortness of breath.   Cardiovascular:  Negative for chest pain, palpitations and leg swelling.  Gastrointestinal:        Has been cleaned out with mineral enema, but had been constipated- severely  Genitourinary: Negative.   Musculoskeletal:  Positive for back pain, joint pain and myalgias.  Skin:        2 new pressure ulcers  Neurological:  Positive for tingling, sensory change and weakness. Negative for seizures and loss of consciousness.  Endo/Heme/Allergies: Negative.   Psychiatric/Behavioral:  Positive for depression. Negative for hallucinations. The patient has insomnia. The patient is not nervous/anxious.   All other systems reviewed and are negative.          Past Surgical  History:  Procedure Laterality Date   COLONOSCOPY       EXCISION MORTON'S NEUROMA Right     fallopian tube removal       INTRATHECAL PUMP IMPLANT Right 02/18/2019    Procedure: INTRATHECAL PUMP IMPLANT;  Surgeon: Clydell Hakim, MD;  Location: [place];  Service: Neurosurgery;  Laterality: Right;  INTRATHECAL PUMP IMPLANT   INTRATHECAL PUMP IMPLANT Right 02/17/2021    Procedure: Baclofen Pump Replacement;  Surgeon: Erline Levine, MD;  Location: [place];  Service: Neurosurgery;  Laterality: Right;  3C/RM 21   INTRATHECAL PUMP REVISION N/A 01/11/2022    Procedure: Baclofen Pump revision;  Surgeon: Dawley, Theodoro Doing, DO;  Location: [place];  Service: Neurosurgery;  Laterality: N/A;  RM 21 to follow   PAIN PUMP IMPLANTATION N/A 09/28/2021    Procedure: Insertion of baclofen pump, right lower quadrant;  Surgeon: Dawley, Theodoro Doing, DO;  Location: [place];  Service: Neurosurgery;  Laterality: N/A;   PAIN PUMP REMOVAL Left 04/06/2021    Procedure: Removal of baclofen pump, Left;  Surgeon: Dawley, Theodoro Doing, DO;  Location: [place];  Service: Neurosurgery;  Laterality: Left;  Lateral/Left//3C rm 19   UPPER GI ENDOSCOPY       WISDOM TOOTH EXTRACTION       WISDOM TOOTH EXTRACTION             Family History  Problem Relation Age of Onset   Diabetes Mother     Healthy Brother      Social History:  reports that she has  been smoking cigarettes. She started smoking about 35 years ago. She has a 16.00 pack-year smoking history. She has never used smokeless tobacco. She reports that she does not currently use alcohol. She reports that she does not use drugs. Allergies:       Allergies  Allergen Reactions   Ziconotide Acetate Shortness Of Breath      Anorexia, weird sensations, could not talk, choking feeling, lost since of taste and smell. Hallucinations, vivid dreams. Confusion and sleepiness. N/v, increase in pain.  Anorexia, weird sensations, could not talk, choking feeling, lost since of taste and smell. Hallucinations,  vivid dreams. Confusion and sleepiness. N/v, increase in pain.    Morphine Other (See Comments)      Pt does not recall reaction    Amoxicillin Nausea And Vomiting   Dantrolene Other (See Comments)      Muscle pain   Lamotrigine Other (See Comments)      Join pain   Lyrica [Pregabalin] Nausea And Vomiting          Medications Prior to Admission  Medication Sig Dispense Refill   albuterol (VENTOLIN HFA) 108 (90 Base) MCG/ACT inhaler Inhale 1-2 puffs into the lungs every 6 (six) hours as needed for wheezing or shortness of breath. 6.7 g 1   amantadine (SYMMETREL) 100 MG capsule Take 1 capsule (100 mg total) by mouth 3 (three) times daily. 60 capsule 0   antiseptic oral rinse (BIOTENE) LIQD 15 mLs by Mouth Rinse route as needed for dry mouth.       Ascorbic Acid (VITAMIN C) 1000 MG tablet Take 1,000 mg by mouth daily.       baclofen (LIORESAL) 20 MG tablet Take 2 tablets (40 mg total) by mouth daily at 2 PM. 30 each 0   baclofen (LIORESAL) 20 MG tablet Take 1-2 tablets (20-40 mg total) by mouth 2 (two) times daily. 30 each 0   buPROPion (WELLBUTRIN XL) 300 MG 24 hr tablet TAKE 1 TABLET DAILY 90 tablet 3   calcium carbonate (TUMS - DOSED IN MG ELEMENTAL CALCIUM) 500 MG chewable tablet Chew 1 tablet (200 mg of elemental calcium total) by mouth 3 (three) times daily as needed for indigestion or heartburn.       carboxymethylcellulose (REFRESH PLUS) 0.5 % SOLN Place 1 drop into both eyes 3 (three) times daily as needed (dry eyes).       Cholecalciferol (VITAMIN D-3) 125 MCG (5000 UT) TABS Take 5,000 Units by mouth daily. 30 tablet 1   conjugated estrogens (PREMARIN) vaginal cream Place 1 Application vaginally 3 (three) times a week. Blueberry size amount for application (Patient not taking: Reported on 03/13/2022)       Cyanocobalamin (VITAMIN B-12) 2500 MCG SUBL Place 1 tablet (2,500 mcg total) under the tongue daily. 30 tablet 0   cyproheptadine (PERIACTIN) 4 MG tablet         dalfampridine 10  MG TB12 Take 1 tablet (10 mg total) by mouth 2 (two) times daily. One po bid 180 tablet 3   diazepam (VALIUM) 5 MG tablet Take 1.5 tablets (7.5 mg total) by mouth every 8 (eight) hours. 140 tablet 5   Docusate Sodium (DSS) 100 MG CAPS Take by mouth.       furosemide (LASIX) 40 MG tablet Take 2 tablets (80 mg total) by mouth daily. 30 tablet 0   Green Tea 150 MG CAPS Green Tea Complex  500 mg daily       methadone (DOLOPHINE) 5 MG tablet Take  1 tablet (5 mg total) by mouth every 12 (twelve) hours. 60 tablet 0   methocarbamol (ROBAXIN) 500 MG tablet Take 1 tablet (500 mg total) by mouth every 6 (six) hours as needed for muscle spasms. 30 tablet 0   methylcellulose (CITRUCEL) oral powder Take by mouth.       montelukast (SINGULAIR) 10 MG tablet TAKE 1 TABLET DAILY (Patient not taking: Reported on 03/13/2022) 90 tablet 3   naproxen (NAPROSYN) 500 MG tablet Take 1 tablet (500 mg total) by mouth 2 (two) times daily with a meal.       polyethylene glycol (MIRALAX / GLYCOLAX) 17 g packet Take 17 g by mouth daily. 14 each 0   potassium chloride SA (KLOR-CON M) 20 MEQ tablet Take 1 tablet (20 mEq total) by mouth every other day while taking Lasix 15 tablet 0   Psyllium (METAMUCIL) 0.36 g CAPS Take 5 capsules by mouth daily.       ranitidine (ZANTAC) 300 MG capsule Take by mouth. (Patient not taking: Reported on 03/13/2022)       senna-docusate (SENOKOT-S) 8.6-50 MG tablet Take 3 tablets by mouth daily.       tiZANidine (ZANAFLEX) 4 MG tablet TAKE 2 TABLETS THREE TIMES A DAY (Patient taking differently: Take 8 mg by mouth 3 (three) times daily.) 720 tablet 3   trospium (SANCTURA) 20 MG tablet Take 20 mg by mouth 2 (two) times daily.       vitamin A 3 MG (10000 UNITS) capsule Take by mouth. (Patient not taking: Reported on 03/13/2022)              Home: Home Living Family/patient expects to be discharged to:: Private residence Living Arrangements: Spouse/significant other Available Help at Discharge:  Family, Available PRN/intermittently Type of Home: House Home Access: Other (comment) Home Layout: Two level, Able to live on main level with bedroom/bathroom Alternate Level Stairs-Number of Steps: 16-18 Bathroom Shower/Tub: Other (comment) Bathroom Toilet: Handicapped height Bathroom Accessibility: Yes Home Equipment: Grab bars - tub/shower, Grab bars - toilet, Rolling Walker (2 wheels), Rollator (4 wheels), Wheelchair - manual, Shower seat - built in, [place] - single point Additional Comments: was getting HHPT prior to [place]  Lives With: Spouse   Functional History: Prior Function Prior Level of Function : Needs assist Physical Assist : Mobility (physical), ADLs (physical) Mobility (physical): Gait, Stairs ADLs (physical): Bathing, Dressing, Toileting, IADLs Mobility Comments: Pt transferred self; walking with assistive device without issues early in summer but not since then. ADLs Comments: independent with most adls at w/c level but started requiring more assist right before admission.   Functional Status:  Mobility: Bed Mobility Overal bed mobility: Needs Assistance Bed Mobility: Supine to Sit Rolling: Min guard Sidelying to sit: Mod assist Supine to sit: Min guard General bed mobility comments: HOB 30 degrees with use of rail and leg lifter to move LLE. Pt able to transition to sitting without physical assist with increased time. Guarding for sitting balance with cues for posture as pt with tendency for right lateral flexion Transfers Overall transfer level: Needs assistance Equipment used: Rolling walker (2 wheels) Transfers: Sit to/from Stand, Bed to chair/wheelchair/BSC Sit to Stand: Min assist Bed to/from chair/wheelchair/BSC transfer type:: Stand pivot Stand pivot transfers: Mod assist Squat pivot transfers: Mod assist, +2 safety/equipment General transfer comment: pt able to stand from bed, bSC and chair x 6 total trials with min assist, cues for hand  placement. Assist to rise and stabilize. Pivot bed<>bsc with RW mod  assist for balance and control of RW Ambulation/Gait Ambulation/Gait assistance: Mod assist, +2 safety/equipment Gait Distance (Feet): 12 Feet Assistive device: Rolling walker (2 wheels) Gait Pattern/deviations: Step-to pattern, Narrow base of support General Gait Details: pt with narrow BOS with reliance on bil UE use on RW to off weight lower body for stepping. Pt vaulting LLE with stepping due to foot drop. Physical assist to shift RW and for balance. Pt with limited awareness of fatigue with chair follow. Pt walked 8' then 12'. Gait velocity interpretation: <1.31 ft/sec, indicative of household ambulator   ADL: ADL Overall ADL's : Needs assistance/impaired Eating/Feeding: Set up, Sitting Grooming: Wash/dry hands, Wash/dry face, Oral care, Sitting, Minimal assistance Upper Body Bathing: Minimal assistance, Sitting Lower Body Bathing: Maximal assistance, Sit to/from stand, +2 for physical assistance Upper Body Dressing : Moderate assistance, Sitting Lower Body Dressing: Maximal assistance, +2 for physical assistance, Sit to/from stand Toilet Transfer: Moderate assistance, +2 for physical assistance, Stand-pivot, BSC/3in1, Standard walker Toilet Transfer Details (indicate cue type and reason): used knee brace Toileting- Clothing Manipulation and Hygiene: Maximal assistance, Sit to/from stand Functional mobility during ADLs: Moderate assistance, +2 for physical assistance (transfers) General ADL Comments: Pt limited with adls due to L side weakness, poor sitting balance and inability to stand without signficant assist.   Cognition: Cognition Overall Cognitive Status: History of cognitive impairments - at baseline Orientation Level: Oriented X4 Cognition Arousal/Alertness: Awake/alert Behavior During Therapy: Flat affect Overall Cognitive Status: History of cognitive impairments - at baseline General Comments: pt with  decreased memory and awareness of sequence of events since admission   Physical Exam: Blood pressure 107/67, pulse 77, temperature (!) 97.3 F (36.3 C), temperature source Oral, resp. rate 18, height 5\' 9"  (1.753 m), weight 56.2 kg, SpO2 100 %. Physical Exam Vitals and nursing note reviewed.  Constitutional:      General: She is not in acute distress.    Appearance: She is ill-appearing.     Comments: Appeared to have lost weight; supine in bed, but can turn to L when directed; which is better than when she left CIR last week, has a lot of concerns today, NAD Power w/c in room  HENT:     Head: Normocephalic and atraumatic.     Right Ear: External ear normal.     Left Ear: External ear normal.     Nose: Nose normal. No congestion.     Mouth/Throat:     Mouth: Mucous membranes are dry.     Pharynx: Oropharynx is clear. No oropharyngeal exudate.  Eyes:     Comments: Pupils very large B/L and responds to light  Cardiovascular:     Rate and Rhythm: Normal rate and regular rhythm.     Heart sounds: Normal heart sounds. No murmur heard.    No gallop.  Pulmonary:     Effort: Pulmonary effort is normal. No respiratory distress.     Breath sounds: Normal breath sounds. No wheezing, rhonchi or rales.  Abdominal:     Palpations: Abdomen is soft.     Comments: No distension- normoactive BS; NT; ITB pump on R side palpable  Musculoskeletal:     Cervical back: Neck supple.     Comments: Able to lift head and move LUE and head around more like her baseline RUE very strong- basically 5/5 Difficult to test on RLE due to tone LLE- back to baseline  Skin:    General: Skin is warm and dry.     Comments: New DTI on  coccyx Also worsening skin open/pressure Stage II on posterior thigh- on R thigh Also DTI on heel  Neurological:     Mental Status: She is alert.     Comments: MAS of 3 in RLE with significant extensor tone MAS of 1+ in LLE with a few beats clonus on LLE and too tight to trigger  clonus on RLE LUE- normal tone,- usually flaccid RUE- normal tone- normally normal   Psychiatric:     Comments: Extremely talkative about concerns today- didn't remember a few things I explained about pain meds and about nerve pain vs somatic pain by the end of conversation/interview        Lab Results Last 48 Hours        Results for orders placed or performed during the hospital encounter of 03/27/22 (from the past 48 hour(s))  CBC     Status: Abnormal    Collection Time: 04/02/22  2:47 AM  Result Value Ref Range    WBC 8.8 4.0 - 10.5 K/uL    RBC 3.54 (L) 3.87 - 5.11 MIL/uL    Hemoglobin 10.9 (L) 12.0 - 15.0 g/dL    HCT 35.1 (L) 36.0 - 46.0 %    MCV 99.2 80.0 - 100.0 fL    MCH 30.8 26.0 - 34.0 pg    MCHC 31.1 30.0 - 36.0 g/dL    RDW 12.5 11.5 - 15.5 %    Platelets 306 150 - 400 K/uL    nRBC 0.0 0.0 - 0.2 %      Comment: Performed at [facility], [address], [name], [place] [address]  Comprehensive metabolic panel     Status: Abnormal    Collection Time: 04/02/22  2:47 AM  Result Value Ref Range    Sodium 141 135 - 145 mmol/L    Potassium 4.6 3.5 - 5.1 mmol/L    Chloride 109 98 - 111 mmol/L    CO2 27 22 - 32 mmol/L    Glucose, Bld 95 70 - 99 mg/dL      Comment: Glucose reference range applies only to samples taken after fasting for at least 8 hours.    BUN 12 6 - 20 mg/dL    Creatinine, Ser 0.76 0.44 - 1.00 mg/dL    Calcium 9.4 8.9 - 10.3 mg/dL    Total Protein 5.5 (L) 6.5 - 8.1 g/dL    Albumin 2.8 (L) 3.5 - 5.0 g/dL    AST 17 15 - 41 U/L    ALT 16 0 - 44 U/L    Alkaline Phosphatase 51 38 - 126 U/L    Total Bilirubin 0.4 0.3 - 1.2 mg/dL    GFR, Estimated >60 >60 mL/min      Comment: (NOTE) Calculated using the CKD-EPI Creatinine Equation (2021)      Anion gap 5 5 - 15      Comment: Performed at [facility], [place] [address], [place], [place] [address]  Creatinine, serum     Status: None    Collection Time: 04/03/22  5:46 AM  Result Value Ref Range     Creatinine, Ser 0.71 0.44 - 1.00 mg/dL    GFR, Estimated >60 >60 mL/min      Comment: (NOTE) Calculated using the CKD-EPI Creatinine Equation (2021) Performed at [facility] [address], [place], [place] [address]        Imaging Results (Last 48 hours)  No results found.  Blood pressure 107/67, pulse 77, temperature (!) 97.3 F (36.3 C), temperature source Oral, resp. rate 18, height 5\' 9"  (1.753 m), weight 56.2 kg, SpO2 100 %.   Medical Problem List and Plan: 1. Functional deficits due to secondary progressive multiple sclerosis and debility from prolonged admission on acute floor for encephalopathy             -patient may  shower             -ELOS/Goals: min A to supervision- 7-10 days 2.  Antithrombotics: -DVT/anticoagulation:  Pharmaceutical: Lovenox             -antiplatelet therapy: none 3. Pain Management:              -Naproxen 500 mg twice daily             -Tylenol as needed             -Oxycodone 10 mg every 4 hours as needed- will change to Nucynta 50 mg q4 hours - will not add Methadone due to chance of polypharmacy that might have occurred last week in addition to Cefepime encephalopathy.  4. Mood/Behavior/Sleep: LCSW to evaluate and provide emotional support             -antipsychotic agents: n/a             -Wellbutrin XL 300 mg daily             -amantadine 100 mg TID- per Dr Felecia Shelling 5. Neuropsych/cognition: This patient is capable of making decisions on her own behalf. 6. Skin/Wound Care: Routine skin care checks 7. Fluids/Electrolytes/Nutrition: Routine Is and Os and follow-up chemistries             -continue Vitamin C, D3, B12 and fiber supplements 8: MS with spasticity: continue dalfampridine BID and [place]              -Baclofen 20 mg twice daily, 40 mg every morning             -Valium 5 mg every 8 hours             -Zanaflex 4 mg twice daily- were reduced due to cognition/sedation on acute- will change out /refill pump Thursday  as well as increase dosing of ITB pump 9: GERD: restart Zantac 10: Neurogenic bladder with indwelling Foley; s/p UTI treatment with Zosyn 11: Bowel program: Nutrisource fiber, psyllium caps, Colace, MiraLax, Senekot-S given daily             -placed PRN sorbitol and Fleets orders- has gotten really constipated/needing multiple enemas-  12: Dysphagia: ? resolved; now on regular diet; SLP eval 13: Seasonal allergies off home meds: monitor 14: Tooth abscess: missed follow-up appointment; completed Flagyl 15: Hypokalemia: on K+ supplement QOD; follow-up BMP tomorrow- likely due to previous Lasix dose for Edema.      I have personally performed a face to face diagnostic evaluation of this patient and formulated the key components of the plan.  Additionally, I have personally reviewed laboratory data, imaging studies, as well as relevant notes and concur with the physician assistant's documentation above.   The patient's status has not changed from the original H&P.  Any changes in documentation from the acute care chart have been noted above.       Barbie Banner, PA-C 04/03/2022

## 2022-04-03 NOTE — PMR Pre-admission (Signed)
PMR Admission Coordinator Pre-Admission Assessment  Patient: Sharon Odom is an 51 y.o., female MRN: [id number] DOB: [birth year] Height: _0  (175.3 cm) Weight: 56.2 kg  Insurance Information HMO:     PPO:      PCP:      IPA:      80/20:      OTHER:  PRIMARY: [place]      Policy#: [id number]      Subscriber: spouse CM Name: faxed approval  from Care management    Phone#: [phone]     Fax#: [phone] Pre-Cert#: [id number] Z approved for 12 days       Employer: [employer] Benefits:  Phone #: [phone]     Name: 11/6 Eff. Date: 05/28/16     Deduct: $4000      Out of Pocket Max: $8000      Life Max: none CIR: 80%      SNF: 80% 130 days Outpatient: 80%     Co-Pay: visits per medical neccesity Home Health: 80%      Co-Pay: 130 combined visits DME: 80%     Co-Pay: 20% Providers: in network  SECONDARY: Medicare part A only policy # [id number]  Financial Counselor:       Phone#:   The "Data Collection Information Summary" for patients in Inpatient Rehabilitation Facilities with attached "Privacy Act [place] Records" was provided and verbally reviewed with: Patient  Emergency Contact Information Contact Information     Name Relation Home Work Mobile   [place] Spouse   [place]   [phone]      Current Medical History  Patient Admitting Diagnosis: Acute Metabolic encephalopathy  History of Present Illness: 51 year old female with history of thrombocytopenia from [place], anxiety, asthma and chronic pain, progressive MS , s/p IT pump replacement 8/23. Initially diagnosis with MS 2013. IT pump removed 04/06/21 due to pump infection. Recent admission to [facility] 03/13/22 to fine tune ITB pump as could not be done in visits to outpatient clinic as well as rehabilitation to improve her overall level of function.   [name], she had progressive weakness. Neuro lt consulted and recommended high dose IV  steroids. MRI showed new demyelinating lesions. Patient did develop fever with U/A positive for Klebsiella pneumoniae and enterobacter . [name]. Readmitted to acute hospital on 03/27/22.  Neurology consult. Felt MRI showed faint punctuate enhancing lesions on a background of lesions consistent with MS, but lesion did not explain current left sided weakness. Patient has been on multiple sedating medications. Felt likely infection causing worsening of pre-existing deficits, as well as oversedation from multiple sedating meds. Steroids placed on hold, and sedating meds decreased. Antibiotic changed to Zosyn for 5 day course in the setting of encephalopathy.   Palliative care consulted to begin goals of care discussions for future recommendations.  Patient's medical record from [facility] has been reviewed by the rehabilitation admission coordinator and physician.  Past Medical History  Past Medical History:  Diagnosis Date   Allergies    Anxiety    Arthritis    Asthma    seasonal - pollen   Constipation    Depression    Dyspnea    with exertion   GERD (gastroesophageal reflux disease)    Headache    History of hiatal hernia    Left radial nerve palsy 03/15/2020   resolved - Left arm/hand weak   Multiple sclerosis ([place])    Pneumonia  2007   x 1   Vision abnormalities    Wears glasses    Has the patient had major surgery during 100 days prior to admission? yes  Family History   family history includes Diabetes in her mother; Healthy in her brother.  Current Medications  Current Facility-Administered Medications:    0.9 %  sodium chloride infusion, 250 mL, Intravenous, PRN, Norins, Heinz Knuckles, MD   acetaminophen (TYLENOL) tablet 650 mg, 650 mg, Oral, Q6H PRN, Norins, Heinz Knuckles, MD, 650 mg at 04/03/22 0305   amantadine (SYMMETREL) capsule 100 mg, 100 mg, Oral, TID, Norins, Heinz Knuckles, MD, 100 mg at 04/03/22 9211   antiseptic oral rinse (BIOTENE) solution  15 mL, 15 mL, Mouth Rinse, PRN, Norins, Heinz Knuckles, MD   ascorbic acid (VITAMIN C) tablet 1,000 mg, 1,000 mg, Oral, Daily, Norins, Heinz Knuckles, MD, 1,000 mg at 04/02/22 1047   baclofen (LIORESAL) intrathecal injection 40 mg/71m, 80 mg, Intrathecal, Once, Pham, Minh Q, RPH-CPP   baclofen (LIORESAL) tablet 40 mg, 40 mg, Oral, Q0600, 40 mg at 04/03/22 0554 **AND** baclofen (LIORESAL) tablet 20 mg, 20 mg, Oral, BID, Danford, CSuann Larry MD, 20 mg at 04/02/22 2111   bisacodyl (DULCOLAX) suppository 10 mg, 10 mg, Rectal, Daily PRN, Danford, CSuann Larry MD   buPROPion (WELLBUTRIN XL) 24 hr tablet 300 mg, 300 mg, Oral, Daily, Norins, MHeinz Knuckles MD, 300 mg at 04/02/22 1042   calcium carbonate (TUMS - dosed in mg elemental calcium) chewable tablet 200 mg of elemental calcium, 1 tablet, Oral, TID PRN, Norins, MHeinz Knuckles MD   Chlorhexidine Gluconate Cloth 2 % PADS 6 each, 6 each, Topical, Daily, Danford, CSuann Larry MD, 6 each at 03/31/22 1321   cholecalciferol (VITAMIN D3) 25 MCG (1000 UNIT) tablet 5,000 Units, 5,000 Units, Oral, Daily, Norins, MHeinz Knuckles MD, 5,000 Units at 04/02/22 1042   cyanocobalamin (VITAMIN B12) tablet 2,500 mcg, 2,500 mcg, Oral, Daily, Norins, MHeinz Knuckles MD, 2,500 mcg at 04/02/22 1041   dalfampridine TB12 10 mg, 10 mg, Oral, BID, Norins, MHeinz Knuckles MD, 10 mg at 04/03/22 0554   diazepam (VALIUM) tablet 5 mg, 5 mg, Oral, Q8H, Danford, Christopher P, MD, 5 mg at 04/03/22 0554   docusate sodium (COLACE) capsule 100 mg, 100 mg, Oral, QHS, Norins, MHeinz Knuckles MD, 100 mg at 03/31/22 2103   enoxaparin (LOVENOX) injection 40 mg, 40 mg, Subcutaneous, Q24H, Norins, MHeinz Knuckles MD, 40 mg at 04/02/22 1809   fiber (NUTRISOURCE FIBER) 1 packet, 1 packet, Oral, Daily, MWilson SingerI, RPH, 1 packet at 04/02/22 1042   naproxen (NAPROSYN) tablet 500 mg, 500 mg, Oral, BID WC, Norins, MHeinz Knuckles MD, 500 mg at 04/03/22 09417  oxyCODONE (Oxy IR/ROXICODONE) immediate release tablet 10 mg, 10 mg,  Oral, Q4H PRN, Danford, CSuann Larry MD, 10 mg at 04/03/22 0706   polyethylene glycol (MIRALAX / GLYCOLAX) packet 17 g, 17 g, Oral, Daily, Norins, MHeinz Knuckles MD, 17 g at 04/02/22 1042   polyvinyl alcohol (LIQUIFILM TEARS) 1.4 % ophthalmic solution 1 drop, 1 drop, Both Eyes, TID PRN, MWilson SingerI, RPH   potassium chloride SA (KLOR-CON M) CR tablet 20 mEq, 20 mEq, Oral, QODAY, Norins, MHeinz Knuckles MD, 20 mEq at 04/02/22 1041   Psyllium CAPS 5 capsule, 5 capsule, Oral, Daily, Norins, MHeinz Knuckles MD, 5 capsule at 04/02/22 1041   senna-docusate (Senokot-S) tablet 3 tablet, 3 tablet, Oral, Daily, Norins, MHeinz Knuckles MD, 3 tablet at 04/02/22 1041   sodium chloride flush (NS) 0.9 %  injection 3 mL, 3 mL, Intravenous, Q12H, Norins, Heinz Knuckles, MD, 3 mL at 04/02/22 2112   sodium chloride flush (NS) 0.9 % injection 3 mL, 3 mL, Intravenous, PRN, Norins, Heinz Knuckles, MD   tiZANidine (ZANAFLEX) tablet 4 mg, 4 mg, Oral, BID, Danford, Suann Larry, MD, 4 mg at 04/02/22 2112   traZODone (DESYREL) tablet 50 mg, 50 mg, Oral, QHS, Danford, Suann Larry, MD   trospium (SANCTURA) tablet 20 mg, 20 mg, Oral, 2 times per day, Danford, Suann Larry, MD, 20 mg at 04/03/22 0554  Patients Current Diet:  Diet Order             Diet regular Room service appropriate? Yes; Fluid consistency: Thin  Diet effective now                  Precautions / Restrictions Precautions Precautions: Fall Other Brace: Hinge brace for Lt knee- need clarification of wear Restrictions Weight Bearing Restrictions: No   Has the patient had 2 or more falls or a fall with injury in the past year? No  Prior Activity Level Household: uses power wheelchair, limited transfers and mobility. Uses power wheelchair with all activity. Sleeps in chair or on couch. Can drive wheelchair into handicapped shower and transfers to shower bench. Uses BSC transfer for bowel program. Has vertical power lift in garage to go from power wheelchair to car.  Uses manuel wheelchair in the community for appointments. Spouse lifts her onto couch, into car, etc as she has not stood safely for months.   Prior Functional Level Self Care: Did the patient need help bathing, dressing, using the toilet or eating? Needed some help  Indoor Mobility: Did the patient need assistance with walking from room to room (with or without device)? Needed some help  Stairs: Did the patient need assistance with internal or external stairs (with or without device)? Needed some help  Functional Cognition: Did the patient need help planning regular tasks such as shopping or remembering to take medications? Needed some help  Patient Information Are you of Hispanic, Latino/a,or Spanish origin?: A. No, not of Hispanic, Latino/a, or Spanish origin What is your race?: A. White Do you need or want an interpreter to communicate with a doctor or health care staff?: 0. No  Patient's Response To:  Health Literacy and Transportation Is the patient able to respond to health literacy and transportation needs?: Yes Health Literacy - How often do you need to have someone help you when you read instructions, pamphlets, or other written material from your doctor or pharmacy?: Never In the past 12 months, has lack of transportation kept you from medical appointments or from getting medications?: No In the past 12 months, has lack of transportation kept you from meetings, work, or from getting things needed for daily living?: No  Development worker, international aid / [place] Devices/Equipment: Other (Comment) (power wheelchair) Home Equipment: Grab bars - tub/shower, Grab bars - toilet, Rolling Walker (2 wheels), Rollator (4 wheels), Wheelchair - manual, Shower seat - built in, [place] - single point  Prior Device Use: Indicate devices/aids used by the patient prior to current illness, exacerbation or injury?  Power wheelchair  Current Functional Level Cognition  Overall Cognitive  Status: History of cognitive impairments - at baseline Orientation Level: Oriented X4 General Comments: pt with decreased memory and awareness of sequence of events since admission    Extremity Assessment (includes Sensation/Coordination)  Upper Extremity Assessment: LUE deficits/detail LUE Deficits / Details: Shoulder 2/5, biceps/triceps 3/5,  grip 3+/5, finger extension 3/5. LUE Sensation: decreased light touch LUE Coordination: decreased fine motor, decreased gross motor  Lower Extremity Assessment: Defer to PT evaluation LLE Deficits / Details: trace movement throughout LLE with functional assessment, not formally assessed with individual MMT today.    ADLs  Overall ADL's : Needs assistance/impaired Eating/Feeding: Set up, Sitting Grooming: Wash/dry hands, Wash/dry face, Oral care, Sitting, Minimal assistance Upper Body Bathing: Minimal assistance, Sitting Lower Body Bathing: Maximal assistance, Sit to/from stand, +2 for physical assistance Upper Body Dressing : Moderate assistance, Sitting Lower Body Dressing: Maximal assistance, +2 for physical assistance, Sit to/from stand Toilet Transfer: Moderate assistance, +2 for physical assistance, Stand-pivot, BSC/3in1, Standard walker Toilet Transfer Details (indicate cue type and reason): used knee brace Toileting- Clothing Manipulation and Hygiene: Maximal assistance, Sit to/from stand Functional mobility during ADLs: Moderate assistance, +2 for physical assistance (transfers) General ADL Comments: Pt limited with adls due to L side weakness, poor sitting balance and inability to stand without signficant assist.    Mobility  Overal bed mobility: Needs Assistance Bed Mobility: Supine to Sit Rolling: Min guard Sidelying to sit: Mod assist Supine to sit: Min guard General bed mobility comments: HOB 30 degrees with use of rail and leg lifter to move LLE. Pt able to transition to sitting without physical assist with increased time.  Guarding for sitting balance with cues for posture as pt with tendency for right lateral flexion    Transfers  Overall transfer level: Needs assistance Equipment used: Rolling walker (2 wheels) Transfers: Sit to/from Stand, Bed to chair/wheelchair/BSC Sit to Stand: Min assist Bed to/from chair/wheelchair/BSC transfer type:: Stand pivot Stand pivot transfers: Mod assist Squat pivot transfers: Mod assist, +2 safety/equipment General transfer comment: pt able to stand from bed, bSC and chair x 6 total trials with min assist, cues for hand placement. Assist to rise and stabilize. Pivot bed<>bsc with RW mod assist for balance and control of RW    Ambulation / Gait / Stairs / Wheelchair Mobility  Ambulation/Gait Ambulation/Gait assistance: Mod assist, +2 safety/equipment Gait Distance (Feet): 12 Feet Assistive device: Rolling walker (2 wheels) Gait Pattern/deviations: Step-to pattern, Narrow base of support General Gait Details: pt with narrow BOS with reliance on bil UE use on RW to off weight lower body for stepping. Pt vaulting LLE with stepping due to foot drop. Physical assist to shift RW and for balance. Pt with limited awareness of fatigue with chair follow. Pt walked 8' then 12'. Gait velocity interpretation: <1.31 ft/sec, indicative of household ambulator    Posture / Balance Dynamic Sitting Balance Sitting balance - Comments: minguard EOB and at [facility] with and without UE support Balance Overall balance assessment: Needs assistance Sitting-balance support: Single extremity supported, Feet supported Sitting balance-Leahy Scale: Fair Sitting balance - Comments: [facility]ingu[place] EOB and at [facility] with and without UE support Postural control: Right lateral lean Standing balance support: Bilateral upper extremity supported, Reliant on assistive device for balance Standing balance-Leahy Scale: Poor Standing balance comment: physical assist and Rw for balance    Special needs/care consideration  Chronic indwelling catheter   Previous Home Environment  Living Arrangements: Spouse/significant other  Lives With: Spouse Available Help at Discharge: Family, Available PRN/intermittently Type of Home: House Home Layout: Two level, Able to live on main level with bedroom/bathroom Alternate Level Stairs-Number of Steps: 16-18 Home Access: Other (comment) Bathroom Shower/Tub: Other (comment) Bathroom Toilet: Handicapped height Bathroom Accessibility: Yes How Accessible: Accessible via wheelchair, Accessible via walker [place]: Yes Type of Home  Care Services: [place] (if known): [facility] Additional Comments: was getting HHPT prior to inital admssion  Discharge Living Setting Plans for Discharge Living Setting: Patient's home, Lives with (comment) (spouse) Type of Home at Discharge: House Discharge Home Layout: Two level, Able to live on main level with bedroom/bathroom Alternate Level Stairs-Number of Steps: flight Discharge Home Access: Ramped entrance Discharge Bathroom Shower/Tub: Walk-in shower (roll in shower) Discharge Bathroom Toilet: Handicapped height Discharge Bathroom Accessibility: Yes How Accessible: Accessible via wheelchair Does the patient have any problems obtaining your medications?: No  Social/Family/Support Systems Patient Roles: Spouse Contact Information: spouse, Shanon [place] Anticipated Caregiver: spouse Anticipated Ambulance person Information: see contacts Ability/Limitations of Caregiver: spouse works Building control surveyor Availability: Intermittent Discharge Plan Discussed with Primary Caregiver: Yes Is Caregiver In Agreement with Plan?: Yes Does Caregiver/Family have Issues with Lodging/Transportation while Pt is in Rehab?: Yes  Goals Patient/Family Goal for Rehab: Mod I to intermittent supervision to min assist with PT and OT at wheelchair level, Mod I with SLP Expected length of stay: ELOS 10 to 12 days Pt/Family Agrees to  Admission and willing to participate: Yes Program Orientation Provided & Reviewed with Pt/Caregiver Including Roles  & Responsibilities: Yes  Barriers to Discharge: Decreased caregiver support  Decrease burden of Care through IP rehab admission: n/a  Possible need for SNF placement upon discharge: not anticipated  Patient Condition: I have reviewed medical records from [facility], spoken with patient and spouse. I met with patient at the bedside for inpatient rehabilitation assessment.  Patient will benefit from ongoing PT, OT, and SLP, can actively participate in 3 hours of therapy a day 5 days of the week, and can make measurable gains during the admission.  Patient will also benefit from the coordinated team approach during an Inpatient Acute Rehabilitation admission.  The patient will receive intensive therapy as well as Rehabilitation physician, nursing, social worker, and care management interventions.  Due to bladder management, bowel management, safety, skin/wound care, disease management, medication administration, pain management, and patient education the patient requires 24 hour a day rehabilitation nursing.  The patient is currently mod assist overall with mobility and basic ADLs.  Discharge setting and therapy post discharge at home with home health is anticipated.  Patient has agreed to participate in the Acute Inpatient Rehabilitation Program and will admit today.  Preadmission Screen Completed By:  Cleatrice Burke, 04/03/2022 10:03 AM ______________________________________________________________________   Discussed status with Dr. Dagoberto Ligas on 04/03/22 at 1004 and received approval for admission today.  Admission Coordinator:  Cleatrice Burke, RN, time 1004 Date 04/03/22   Assessment/Plan: Diagnosis: Does the need for close, 24 hr/day Medical supervision in concert with the patient's rehab needs make it unreasonable for this patient to be served in a less  intensive setting? Yes Co-Morbidities requiring supervision/potential complications: Secondary progressive MS; UTI; w/c dependence; spasticity, chronic nerve/pain Due to bladder management, bowel management, safety, skin/wound care, disease management, medication administration, pain management, and patient education, does the patient require 24 hr/day rehab nursing? Yes Does the patient require coordinated care of a physician, rehab nurse, PT, OT, and SLP to address physical and functional deficits in the context of the above medical diagnosis(es)? Yes Addressing deficits in the following areas: balance, endurance, locomotion, strength, transferring, bowel/bladder control, bathing, dressing, feeding, grooming, and toileting Can the patient actively participate in an intensive therapy program of at least 3 hrs of therapy 5 days a week? Yes The potential for patient to make measurable gains while on inpatient  rehab is good and fair Anticipated functional outcomes upon discharge from inpatient rehab: supervision and min assist PT, supervision and min assist OT, n/a SLP Estimated rehab length of stay to reach the above functional goals is: 10-12 days Anticipated discharge destination: Home 10. Overall Rehab/Functional Prognosis: good and fair   MD Signature:

## 2022-04-03 NOTE — Progress Notes (Signed)
Patient arrived via [employer] from [facility] W. Patient is A&O x 2-3 with confusion. Husband at bedside. Patient C/O air mattress that she wants to sleep in W/C. Report received from [facility]  states that patient C/O air mattress however at this time it is ordered to help maintain skin integrity. Reeducated patient. Patient is Q 2 hour reposition with pillows for support, air mattress cycles Q 15 minutes. Patient is dependant for all care. Lungs diminished bilateral, encouraged cough and deep breathing. ABD soft non distend bowel sound x 4. Foley catheter patient draining cloudy yellow urine, no odor. Skin assessment completed on [place]. Patient refused heal lift boots at this time.

## 2022-04-03 NOTE — Progress Notes (Signed)
Patient continues to C/O air mattress not working properly. Portable equipment notified of patient complaint regarding air mattress. Two representatives currently on the floor evaluating. Portable equipment states that her air mattress is functioning properly.  However they will put in a request to bring up a new air mattress to satisfy patient.

## 2022-04-03 NOTE — Discharge Summary (Signed)
Physician Discharge Summary   Patient: Sharon Odom MRN: [id number] DOB: 26-Jun-1970  Admit date:     03/27/2022  Discharge date: 04/03/22  Discharge Physician: Alberteen Sam   PCP: Charisse March, NP-C     Recommendations at discharge:  Follow up with IP Rehab MD Dr. Berline Chough at [facility] Follow up with Dr. Epimenio Foot after discharge Follow up with Palliative Care at [facility]     Discharge Diagnoses: Principal Problem:   Acute metabolic encephalopathy Active Problems:   Failure to thrive in adult   Relapsing remitting multiple sclerosis (HCC)   UTI (urinary tract infection) with pyuria   Depression with anxiety   Presence of intrathecal baclofen pump   Steroid-induced hyperglycemia   Normocytic anemia   Hypokalemia      Hospital Course: Sharon Odom is a 51 y.o. F with relapsing progressive MS, hx baclofen pump infection Nov 2022, treated with extended IV antibiotics, now pump recently replaced (May 2023), history of thrombocytopenia from [place], anxiety, asthma and chronic pain who was recently admitted to inpatient rehab from home for gradually worsening physical status at home who had progressive weakness in the hospital and then fever.    Urine culture grew Klebsiella and Enterobacter for which she was Started on antibiotics and MRI showed new demyelinating lesions, so she was started on Solu-medrol and admitted to acute care for Neurology evaluation.  Brief pre-admission summary: 10/17: Admitted to CIR 10/27: Fever, much more weak, encephalopathic, globally weak, nitrofurantoin started 10/29: Urine culture with Enterobacter, Klebsiella, changed to cefepime 10/30: MRI brain shows new lesions, Solu-medrol started 10/31: Transferred to Acute Care, Neuro consulted, Solu-medrol stopped       * Acute toxic and metabolic encephalopathy This was likely multifactorial from dehydration, UTI and medication excess.    Here, she was evaluated by Neurology who felt MS was NOT the  cause of her symptoms and the possible demyelinating lesion on imaging was likely incidental.  Methadone and Periactin stopped, and Valium, baclofen p.o., and tizanidine all titrated down.  Subsequently, patient returned to her baseline.  Would recommend caution with initiating further psychoactive drugs.  Would recommend titrating psychoactive drugs one at a time.    Relapsing remitting multiple sclerosis (HCC) Failure to thrive in adult Overall the patient is having a functional decline over months.  Given challenges with managing spasticity and pain in the last few weeks, I have concern that her symptom burden is growing and may not yield to medical treatments or rehabilitation and that she should have goals of care discussions with Palliative Care to prepare for a possible future with more functional limitations than she currently has.      UTI (urinary tract infection) with pyuria Due to chronic indwelling Foley.  Urine culture from Foley growing Enterobacter and Klebsiella.  Completed a course of antibiotics.              Consultants: Neurology Procedures performed: None  Disposition: Rehabilitation facility   DISCHARGE MEDICATION: Allergies as of 04/03/2022       Reactions   Ziconotide Acetate Shortness Of Breath   Anorexia, weird sensations, could not talk, choking feeling, lost since of taste and smell. Hallucinations, vivid dreams. Confusion and sleepiness. N/v, increase in pain.  Anorexia, weird sensations, could not talk, choking feeling, lost since of taste and smell. Hallucinations, vivid dreams. Confusion and sleepiness. N/v, increase in pain.    Morphine Other (See Comments)   Pt does not recall reaction    Amoxicillin Nausea And Vomiting  Dantrolene Other (See Comments)   Muscle pain   Lamotrigine Other (See Comments)   Join pain   Lyrica [pregabalin] Nausea And Vomiting        Medication List     STOP taking these medications    Citrucel  oral powder Generic drug: methylcellulose   cyproheptadine 4 MG tablet Commonly known as: PERIACTIN   furosemide 40 MG tablet Commonly known as: LASIX   methadone 5 MG tablet Commonly known as: DOLOPHINE   methocarbamol 500 MG tablet Commonly known as: ROBAXIN   montelukast 10 MG tablet Commonly known as: SINGULAIR   ranitidine 300 MG capsule Commonly known as: ZANTAC   vitamin A 3 MG (10000 UNITS) capsule       TAKE these medications    albuterol 108 (90 Base) MCG/ACT inhaler Commonly known as: VENTOLIN HFA Inhale 1-2 puffs into the lungs every 6 (six) hours as needed for wheezing or shortness of breath.   amantadine 100 MG capsule Commonly known as: SYMMETREL Take 1 capsule (100 mg total) by mouth 3 (three) times daily.   antiseptic oral rinse Liqd 15 mLs by Mouth Rinse route as needed for dry mouth.   baclofen 20 MG tablet Commonly known as: LIORESAL Take 1 tablet (20 mg total) by mouth 2 (two) times daily. What changed: how much to take   baclofen 40 MG/20ML Soln Commonly known as: LIORESAL 40 mLs (80 mg total) by Intrathecal route once for 1 dose. What changed: You were already taking a medication with the same name, and this prescription was added. Make sure you understand how and when to take each.   baclofen 20 MG tablet Commonly known as: LIORESAL Take 2 tablets (40 mg total) by mouth daily at 6 (six) AM. Start taking on: April 04, 2022 What changed: when to take this   bisacodyl 10 MG suppository Commonly known as: DULCOLAX Place 1 suppository (10 mg total) rectally daily as needed for severe constipation.   buPROPion 300 MG 24 hr tablet Commonly known as: WELLBUTRIN XL TAKE 1 TABLET DAILY   calcium carbonate 500 MG chewable tablet Commonly known as: TUMS - dosed in mg elemental calcium Chew 1 tablet (200 mg of elemental calcium total) by mouth 3 (three) times daily as needed for indigestion or heartburn.   carboxymethylcellulose 0.5 %  Soln Commonly known as: REFRESH PLUS Place 1 drop into both eyes 3 (three) times daily as needed (dry eyes).   conjugated estrogens vaginal cream Commonly known as: PREMARIN Place 1 Application vaginally 3 (three) times a week. Blueberry size amount for application   dalfampridine 10 MG Tb12 Take 1 tablet (10 mg total) by mouth 2 (two) times daily. One po bid   diazepam 5 MG tablet Commonly known as: VALIUM Take 1 tablet (5 mg total) by mouth every 8 (eight) hours. What changed: how much to take   DSS 100 MG Caps Take by mouth.   Green Tea 150 MG Caps Green Tea Complex  500 mg daily   Metamucil 0.36 g Caps Generic drug: Psyllium Take 5 capsules by mouth daily.   naproxen 500 MG tablet Commonly known as: NAPROSYN Take 1 tablet (500 mg total) by mouth 2 (two) times daily with a meal.   Oxycodone HCl 10 MG Tabs Take 1 tablet (10 mg total) by mouth every 4 (four) hours as needed for moderate pain or severe pain.   polyethylene glycol 17 g packet Commonly known as: MIRALAX / GLYCOLAX Take 17 g by mouth daily.  potassium chloride SA 20 MEQ tablet Commonly known as: KLOR-CON M Take 1 tablet (20 mEq total) by mouth every other day while taking Lasix   senna-docusate 8.6-50 MG tablet Commonly known as: Senokot-S Take 3 tablets by mouth daily.   tiZANidine 4 MG tablet Commonly known as: ZANAFLEX TAKE 2 TABLETS THREE TIMES A DAY What changed: See the new instructions.   trospium 20 MG tablet Commonly known as: SANCTURA Take 20 mg by mouth 2 (two) times daily.   Vitamin B-12 2500 MCG Subl Place 1 tablet (2,500 mcg total) under the tongue daily.   vitamin C 1000 MG tablet Take 1,000 mg by mouth daily.   Vitamin D-3 125 MCG (5000 UT) Tabs Take 5,000 Units by mouth daily.           Discharge Exam: Filed Weights   03/31/22 1500  Weight: 56.2 kg    General: Pt is alert, awake, not in acute distress, sitting up in motorized wheelchair Cardiovascular: RRR,  nl S1-S2, no murmurs appreciated.   No LE edema.   Respiratory: Normal respiratory rate and rhythm.  CTAB without rales or wheezes. Neuro/Psych: Spasticity noted, dysarthria noted, left weakness noted.    Condition at discharge: stable  The results of significant diagnostics from this hospitalization (including imaging, microbiology, ancillary and laboratory) are listed below for reference.   Imaging Studies: MR BRAIN W WO CONTRAST  Result Date: 03/26/2022 CLINICAL DATA:  Left greater than right spasticity in the setting of progressive MS. EXAM: MRI HEAD WITHOUT AND WITH CONTRAST TECHNIQUE: Multiplanar, multiecho pulse sequences of the brain and surrounding structures were obtained without and with intravenous contrast. CONTRAST:  74mL GADAVIST GADOBUTROL 1 MMOL/ML IV SOLN COMPARISON:  MRI Brain 11/12/21 FINDINGS: Brain: No acute infarction, hemorrhage, hydrocephalus, extra-axial collection or mass lesion. Redemonstrated are extensive T2/FLAIR hyperintense periventricular and subcortical T2/FLAIR hyperintense lesions which have a morphology that is consistent with a demyelinating process. No diffusion restricted lesions are seen. There is a focal contrast-enhancing lesion in the left occipital lobe (series 21, image 26). There are multiple lesions fat demonstrate marked hypointensity on T1 weighted imaging, suggestive chronic volume loss. T2 hyperintense lesions are also seen in the pons in the visualized cervical spinal cord. Vascular: Normal flow voids. Skull and upper cervical spine: Normal marrow signal. Sinuses/Orbits: Negative. Other: None. IMPRESSION: Extensive T2/FLAIR hyperintense periventricular and subcortical lesions which have a morphology that is consistent with a demyelinating process. There is a focal contrast-enhancing lesion in the left occipital lobe, which could suggest active demyelination active demyelination. Electronically Signed   By: Lorenza Cambridge M.D.   On: 03/26/2022 17:02    DG Chest 2 View  Result Date: 03/24/2022 CLINICAL DATA:  Fever today. EXAM: CHEST - 2 VIEW COMPARISON:  Radiograph 03/13/2022 FINDINGS: Lower lung volumes from prior exam. Mild patchy bibasilar opacities. The heart is normal in size. Normal mediastinal contours allowing for rotation. No large pleural effusion. No pneumothorax. Remote right posterior rib fractures. IMPRESSION: Mild patchy bibasilar opacities, may be atelectasis or pneumonia in the setting of fever. Electronically Signed   By: Narda Rutherford M.D.   On: 03/24/2022 09:45   DG Abd 1 View  Result Date: 03/22/2022 CLINICAL DATA:  Abdominal pain and distention EXAM: ABDOMEN - 1 VIEW COMPARISON:  11/06/2021 FINDINGS: Bowel gas pattern is nonspecific. Large amount of stool is seen in colon. There is no dilation of small-bowel loops. There is an electronic device overlying the right iliac bone which was also seen in previous study. No  abnormal masses or calcifications are seen. Kidneys are mostly obscured by bowel contents limiting evaluation for small renal stones. Possible rectal tube is seen. IMPRESSION: Nonspecific bowel gas pattern. Large amount of stool is seen in colon. Electronically Signed   By: Ernie Avena M.D.   On: 03/22/2022 15:07   DG Chest Port 1 View  Result Date: 03/13/2022 CLINICAL DATA:  Rhonchi EXAM: PORTABLE CHEST 1 VIEW COMPARISON:  06/18/2021 FINDINGS: The heart size and mediastinal contours are within normal limits. Both lungs are clear. Old right-sided rib fractures. Punctate square shaped metallic density overlying the fifth vertebral body. IMPRESSION: No active disease. Punctate metallic density overlying the mid mediastinal region is indeterminate for artifact or foreign body, consider two-view chest follow-up for further assessment. Electronically Signed   By: Jasmine Pang M.D.   On: 03/13/2022 16:10    Microbiology: Results for orders placed or performed during the hospital encounter of 03/13/22   Remove and replace urinary cath (placed > 5 days) then obtain urine culture from new indwelling urinary catheter.     Status: Abnormal   Collection Time: 03/23/22  9:55 AM   Specimen: Urine, Catheterized  Result Value Ref Range Status   Specimen Description URINE, CATHETERIZED  Final   Special Requests   Final    NONE Performed at [facility], [address] [place], [place] [address]    Culture (A)  Final    >=100,000 COLONIES/mL KLEBSIELLA PNEUMONIAE >=100,000 COLONIES/mL ENTEROBACTER CLOACAE    Report Status 03/25/2022 FINAL  Final   Organism ID, Bacteria KLEBSIELLA PNEUMONIAE (A)  Final   Organism ID, Bacteria ENTEROBACTER CLOACAE (A)  Final      Susceptibility   Enterobacter cloacae - MIC*    CEFAZOLIN >=64 RESISTANT Resistant     CEFEPIME <=0.12 SENSITIVE Sensitive     CIPROFLOXACIN <=0.25 SENSITIVE Sensitive     GENTAMICIN <=1 SENSITIVE Sensitive     IMIPENEM <=0.25 SENSITIVE Sensitive     NITROFURANTOIN 32 SENSITIVE Sensitive     TRIMETH/SULFA <=20 SENSITIVE Sensitive     PIP/TAZO <=4 SENSITIVE Sensitive     * >=100,000 COLONIES/mL ENTEROBACTER CLOACAE   Klebsiella pneumoniae - MIC*    AMPICILLIN RESISTANT Resistant     CEFAZOLIN <=4 SENSITIVE Sensitive     CEFEPIME <=0.12 SENSITIVE Sensitive     CEFTRIAXONE <=0.25 SENSITIVE Sensitive     CIPROFLOXACIN <=0.25 SENSITIVE Sensitive     GENTAMICIN <=1 SENSITIVE Sensitive     IMIPENEM <=0.25 SENSITIVE Sensitive     NITROFURANTOIN 128 RESISTANT Resistant     TRIMETH/SULFA <=20 SENSITIVE Sensitive     AMPICILLIN/SULBACTAM <=2 SENSITIVE Sensitive     PIP/TAZO <=4 SENSITIVE Sensitive     * >=100,000 COLONIES/mL KLEBSIELLA PNEUMONIAE  Culture, blood (Routine X 2) w Reflex to ID Panel     Status: None   Collection Time: 03/24/22  9:27 AM   Specimen: BLOOD  Result Value Ref Range Status   Specimen Description BLOOD BLOOD RIGHT ARM  Final   Special Requests   Final    BOTTLES DRAWN AEROBIC AND ANAEROBIC Blood  Culture adequate volume   Culture   Final    NO GROWTH 5 DAYS Performed at [facility], [address], [place], [place] [address]    Report Status 03/29/2022 FINAL  Final  Culture, blood (Routine X 2) w Reflex to ID Panel     Status: None   Collection Time: 03/24/22  9:38 AM   Specimen: BLOOD  Result Value  Ref Range Status   Specimen Description BLOOD BLOOD RIGHT ARM  Final   Special Requests   Final    BOTTLES DRAWN AEROBIC AND ANAEROBIC Blood Culture adequate volume   Culture   Final    NO GROWTH 5 DAYS Performed at [facility], [address], [place], [place] [address]    Report Status 03/29/2022 FINAL  Final    Labs: CBC: Recent Labs  Lab 03/29/22 0439 03/30/22 0311 04/02/22 0247  WBC 14.5* 10.1 8.8  NEUTROABS 11.2*  --   --   HGB 10.5* 10.1* 10.9*  HCT 31.1* 31.1* 35.1*  MCV 95.1 97.5 99.2  PLT 292 261 306   Basic Metabolic Panel: Recent Labs  Lab 03/29/22 0439 03/30/22 0311 04/02/22 0247 04/03/22 0546  NA 140 145 141  --   K 3.4* 4.0 4.6  --   CL 102 109 109  --   CO2 29 27 27   --   GLUCOSE 106* 96 95  --   BUN 43* 24* 12  --   CREATININE 1.25* 0.77 0.76 0.71  CALCIUM 9.1 8.9 9.4  --    Liver Function Tests: Recent Labs  Lab 03/29/22 0439 04/02/22 0247  AST 12* 17  ALT 11 16  ALKPHOS 62 51  BILITOT 0.6 0.4  PROT 5.6* 5.5*  ALBUMIN 2.7* 2.8*   CBG: Recent Labs  Lab 03/29/22 1950 03/30/22 0801 03/30/22 1142 03/30/22 2141 03/31/22 0823  GLUCAP 102* 96 116* 117* 91    Discharge time spent: approximately 25 minutes spent on discharge counseling, evaluation of patient on day of discharge, and coordination of discharge planning with nursing, social work, pharmacy and case management  Signed: 13/04/23, MD [facility] 04/03/2022

## 2022-04-03 NOTE — Progress Notes (Signed)
Mattress replaced tonight for the patient. Made comfortable in bed. Call bell within reach, needs attended.

## 2022-04-03 NOTE — Progress Notes (Signed)
Physical Therapy Treatment Patient Details Name: Sharon Odom MRN: [id number] DOB: 03-Apr-1971 Today's Date: 04/03/2022   History of Present Illness 51 y.o. F admitted 10/31 from [facility] for FTT and UTI. Pt admitted to AIR from home 10/17 to tune baclofen pump. PMHx: relapsing progressive MS, baclofen pump recently replaced (May 2023), thrombocytopenia, anxiety, asthma and chronic pain    PT Comments    Pt pleasant and eager to continue to progress mobility. Pt with improved bed mobility, transfers and gait this session. Pt with use of leg lifter to move LLE with bed mobility and able to increase activity tolerance this session. Pt in power chair with breakfast end of session.     Recommendations for follow up therapy are one component of a multi-disciplinary discharge planning process, led by the attending physician.  Recommendations may be updated based on patient status, additional functional criteria and insurance authorization.  Follow Up Recommendations  Acute inpatient rehab (3hours/day)     Assistance Recommended at Discharge Frequent or constant Supervision/Assistance  Patient can return home with the following Assistance with cooking/housework;Assist for transportation;Help with stairs or ramp for entrance;Direct supervision/assist for medications management;A lot of help with walking and/or transfers;A lot of help with bathing/dressing/bathroom   Equipment Recommendations  None recommended by PT    Recommendations for Other Services       Precautions / Restrictions Precautions Precautions: Fall     Mobility  Bed Mobility Overal bed mobility: Needs Assistance Bed Mobility: Supine to Sit     Supine to sit: Min guard     General bed mobility comments: HOB 30 degrees with use of rail and leg lifter to move LLE. Pt able to transition to sitting without physical assist with increased time. Guarding for sitting balance with cues for posture as pt with tendency for right  lateral flexion    Transfers Overall transfer level: Needs assistance   Transfers: Sit to/from Stand, Bed to chair/wheelchair/BSC Sit to Stand: Min assist Stand pivot transfers: Mod assist         General transfer comment: pt able to stand from bed, bSC and chair x 6 total trials with min assist, cues for hand placement. Assist to rise and stabilize. Pivot bed<>bsc with RW mod assist for balance and control of RW    Ambulation/Gait Ambulation/Gait assistance: Mod assist, +2 safety/equipment Gait Distance (Feet): 12 Feet Assistive device: Rolling walker (2 wheels) Gait Pattern/deviations: Step-to pattern, Narrow base of support   Gait velocity interpretation: <1.31 ft/sec, indicative of household ambulator   General Gait Details: pt with narrow BOS with reliance on bil UE use on RW to off weight lower body for stepping. Pt vaulting LLE with stepping due to foot drop. Physical assist to shift RW and for balance. Pt with limited awareness of fatigue with chair follow. Pt walked 8' then 12'.   Stairs             Wheelchair Mobility    Modified Rankin (Stroke Patients Only)       Balance Overall balance assessment: Needs assistance Sitting-balance support: Single extremity supported, Feet supported Sitting balance-Leahy Scale: Fair Sitting balance - Comments: minguard EOB and at [facility] with and without UE support   Standing balance support: Bilateral upper extremity supported, Reliant on assistive device for balance Standing balance-Leahy Scale: Poor Standing balance comment: physical assist and Rw for balance  Cognition Arousal/Alertness: Awake/alert Behavior During Therapy: Flat affect Overall Cognitive Status: History of cognitive impairments - at baseline                                 General Comments: pt with decreased memory and awareness of sequence of events since admission        Exercises       General Comments        Pertinent Vitals/Pain Pain Assessment Pain Score: 7  Pain Location: bil LE and LUE Pain Descriptors / Indicators: Aching, Sore, Tightness Pain Intervention(s): Limited activity within patient's tolerance, Repositioned, Monitored during session, Premedicated before session    Home Living         Home Access: Other (comment)         Home Equipment: Grab bars - tub/shower;Grab bars - toilet;Rolling Walker (2 wheels);Rollator (4 wheels);Wheelchair - manual;Shower seat - built in;Cane - single point Additional Comments: was getting HHPT prior to inital admssion    Prior Function            PT Goals (current goals can now be found in the care plan section) Progress towards PT goals: Progressing toward goals    Frequency    Min 4X/week      PT Plan Current plan remains appropriate    Co-evaluation              AM-PAC PT "6 Clicks" Mobility   Outcome Measure  Help needed turning from your back to your side while in a flat bed without using bedrails?: A Little Help needed moving from lying on your back to sitting on the side of a flat bed without using bedrails?: A Little Help needed moving to and from a bed to a chair (including a wheelchair)?: A Lot Help needed standing up from a chair using your arms (e.g., wheelchair or bedside chair)?: A Little Help needed to walk in hospital room?: Total Help needed climbing 3-5 steps with a railing? : Total 6 Click Score: 13    End of Session Equipment Utilized During Treatment: Gait belt Activity Tolerance: Patient tolerated treatment well Patient left: in chair;with call bell/phone within reach (in power chair) Nurse Communication: Mobility status PT Visit Diagnosis: Unsteadiness on feet (R26.81);Muscle weakness (generalized) (M62.81);Other symptoms and signs involving the nervous system (R29.898);Adult, failure to thrive (R62.7);Other abnormalities of gait and mobility (R26.89)     Time:  7867-5449 PT Time Calculation (min) (ACUTE ONLY): 40 min  Charges:  $Gait Training: 8-22 mins $Therapeutic Activity: 23-37 mins                     Merryl Hacker, PT Acute Rehabilitation Services Office: [phone]    Cristine Polio 04/03/2022, 9:38 AM

## 2022-04-03 NOTE — Progress Notes (Signed)
Inpatient Rehabilitation Admissions Coordinator   I have insurance approval and CIR bed to admit her today. I met with patient at bedside and she is in agreement. Acute team  and TOC made aware. I will make the arrangements to admit today.  Danne Baxter, RN, MSN Rehab Admissions Coordinator [phone] 04/03/2022 10:02 AM

## 2022-04-04 ENCOUNTER — Ambulatory Visit: Payer: Medicare Other | Admitting: Neurology

## 2022-04-04 ENCOUNTER — Encounter: Payer: BLUE CROSS/BLUE SHIELD | Admitting: Physical Medicine and Rehabilitation

## 2022-04-04 DIAGNOSIS — G35 Multiple sclerosis: Secondary | ICD-10-CM | POA: Diagnosis not present

## 2022-04-04 LAB — COMPREHENSIVE METABOLIC PANEL
ALT: 17 U/L (ref 0–44)
AST: 14 U/L — ABNORMAL LOW (ref 15–41)
Albumin: 3.2 g/dL — ABNORMAL LOW (ref 3.5–5.0)
Alkaline Phosphatase: 55 U/L (ref 38–126)
Anion gap: 9 (ref 5–15)
BUN: 12 mg/dL (ref 6–20)
CO2: 27 mmol/L (ref 22–32)
Calcium: 9.8 mg/dL (ref 8.9–10.3)
Chloride: 106 mmol/L (ref 98–111)
Creatinine, Ser: 0.65 mg/dL (ref 0.44–1.00)
GFR, Estimated: >60 mL/min (ref >60)
Glucose, Bld: 106 mg/dL — ABNORMAL HIGH (ref 70–99)
Potassium: 4.1 mmol/L (ref 3.5–5.1)
Sodium: 142 mmol/L (ref 135–145)
Total Bilirubin: 0.4 mg/dL (ref 0.3–1.2)
Total Protein: 5.7 g/dL — ABNORMAL LOW (ref 6.5–8.1)

## 2022-04-04 LAB — CBC WITH DIFFERENTIAL/PLATELET
Abs Immature Granulocytes: 0.28 10*3/uL — ABNORMAL HIGH (ref 0.00–0.07)
Basophils Absolute: 0 10*3/uL (ref 0.0–0.1)
Basophils Relative: 0 %
Eosinophils Absolute: 0.2 10*3/uL (ref 0.0–0.5)
Eosinophils Relative: 2 %
HCT: 34.7 % — ABNORMAL LOW (ref 36.0–46.0)
Hemoglobin: 11 g/dL — ABNORMAL LOW (ref 12.0–15.0)
Immature Granulocytes: 3 %
Lymphocytes Relative: 13 %
Lymphs Abs: 1.4 10*3/uL (ref 0.7–4.0)
MCH: 31.4 pg (ref 26.0–34.0)
MCHC: 31.7 g/dL (ref 30.0–36.0)
MCV: 99.1 fL (ref 80.0–100.0)
Monocytes Absolute: 0.5 10*3/uL (ref 0.1–1.0)
Monocytes Relative: 5 %
Neutro Abs: 8.4 10*3/uL — ABNORMAL HIGH (ref 1.7–7.7)
Neutrophils Relative %: 77 %
Platelets: 318 10*3/uL (ref 150–400)
RBC: 3.5 MIL/uL — ABNORMAL LOW (ref 3.87–5.11)
RDW: 13 % (ref 11.5–15.5)
WBC: 10.8 10*3/uL — ABNORMAL HIGH (ref 4.0–10.5)
nRBC: 0 % (ref 0.0–0.2)

## 2022-04-04 NOTE — Progress Notes (Signed)
Physical Therapy Assessment and Plan  Patient Details  Name: Sharon Odom MRN: [id number] Date of Birth: 1970-09-22  PT Diagnosis: Abnormal posture, Cognitive deficits, Hypertonia, Impaired cognition, Impaired sensation, Muscle spasms, and Muscle weakness Rehab Potential: Fair ELOS: 7-10 days   Today's Date: 04/04/2022 PT Individual Time: 1046-1200 PT Individual Time Calculation (min): 74 min    Hospital Problem: Principal Problem:   Multiple sclerosis ([place]) Active Problems:   Spasticity   Pressure injury of skin   Past Medical History:  Past Medical History:  Diagnosis Date   Allergies    Anxiety    Arthritis    Asthma    seasonal - pollen   Constipation    Depression    Dyspnea    with exertion   GERD (gastroesophageal reflux disease)    Headache    History of hiatal hernia    Left radial nerve palsy 03/15/2020   resolved - Left arm/hand weak   Multiple sclerosis ([place])    Pneumonia 2007   x 1   Vision abnormalities    Wears glasses    Past Surgical History:  Past Surgical History:  Procedure Laterality Date   COLONOSCOPY     EXCISION MORTON'S NEUROMA Right    fallopian tube removal     INTRATHECAL PUMP IMPLANT Right 02/18/2019   Procedure: INTRATHECAL PUMP IMPLANT;  Surgeon: Clydell Hakim, MD;  Location: [place];  Service: Neurosurgery;  Laterality: Right;  INTRATHECAL PUMP IMPLANT   INTRATHECAL PUMP IMPLANT Right 02/17/2021   Procedure: Baclofen Pump Replacement;  Surgeon: Erline Levine, MD;  Location: [place];  Service: Neurosurgery;  Laterality: Right;  3C/RM 21   INTRATHECAL PUMP REVISION N/A 01/11/2022   Procedure: Baclofen Pump revision;  Surgeon: Dawley, Theodoro Doing, DO;  Location: [place];  Service: Neurosurgery;  Laterality: N/A;  RM 21 to follow   PAIN PUMP IMPLANTATION N/A 09/28/2021   Procedure: Insertion of baclofen pump, right lower quadrant;  Surgeon: Dawley, Theodoro Doing, DO;  Location: [place];  Service: Neurosurgery;  Laterality: N/A;   PAIN PUMP REMOVAL Left  04/06/2021   Procedure: Removal of baclofen pump, Left;  Surgeon: Dawley, Theodoro Doing, DO;  Location: [place];  Service: Neurosurgery;  Laterality: Left;  Lateral/Left//3C rm 19   UPPER GI ENDOSCOPY     WISDOM TOOTH EXTRACTION     WISDOM TOOTH EXTRACTION      Assessment & Plan Clinical Impression: Patient is a 51 y.o. year old female  with a history of secondary progressive multiple sclerosis. She has had significant left greater than right spasticity with subsequent implantation of intrathecal pump for baclofen administration. She underwent revision of the abdominal portion of the pump by Dr. Reatha Armour on 01/11/2022. At the time of follow up on 10/6, she reported worsening pain especially when sitting despite increase in 5% increase in baclofen pump. She had been taking oral baclofen 20 mg six/day, oxycodone 10 mg six/day and on Dantrolene 100 mg BID. She was admitted to inpatient rehab on 03/13/2022. Pain medication and baclofen orally and IT were adjusted. Locking knee brace placed to left knee. Call to Dr. Felecia Shelling on 10/25 and amantadine started 100 mg TID. Foley cath removed and replaced and urine culture reveled Klebsiella and Enterobacter. Macrodantin was discontinued and she was started on cefepime due to fever spike to 101.2 on 10/28. Nucynta started. BC negative. MRI performed on 10/30 showed acute demyelination and Solumedrol 1000 mg daily for 5 days started on 10/31. Unable to participate in therapy therefore transferred to acute  hospital bed.    Neurology, Dr. Cheral Marker was consulted. Diagnosed with acute toxic and metabolic encephalopathy from dehydration, UTI and medication excess, possible from cefepime administration. Felt to be pseudo-exacerbation and findings on MRI did not explain her symptoms. Dantrolene, Lamictal discontinued. Antibiotic changed to Zosyn and she completed 5 days of therapy. Palliative care consultation obtained on 11/3. They will continue to follow. She is currently min guard with  bed mobility, min assist with transfers, mod assist +2 for ambulation/gait. Reliance on BUE use on RW to off weight lower body for stepping. The patient requires inpatient physical medicine and rehabilitation evaluations and treatment secondary to dysfunction due to secondary progressive multiple sclerosis.   She follows with Dr. Arlice Colt, [facility]. Last [place] in 2021. Patient transferred to CIR on 04/03/2022 .   Patient currently requires mod with mobility secondary to muscle weakness, decreased cardiorespiratoy endurance, impaired timing and sequencing, abnormal tone, unbalanced muscle activation, decreased coordination, and decreased motor planning, decreased awareness, decreased problem solving, decreased safety awareness, decreased memory, and delayed processing, and decreased sitting balance, decreased standing balance, decreased postural control, and decreased balance strategies.  Prior to hospitalization, patient was  requiring assistance   with mobility and lived with Spouse in a House home.  Home access is  Other (comment), Elevator.  Patient will benefit from skilled PT intervention to maximize safe functional mobility, minimize fall risk, and decrease caregiver burden for planned discharge home with 24 hour assist.  Anticipate patient will benefit from follow up [facility] at discharge.  PT - End of Session Activity Tolerance: Tolerates 30+ min activity with multiple rests Endurance Deficit: Yes Endurance Deficit Description: decreased PT Assessment Rehab Potential (ACUTE/IP ONLY): Fair PT Barriers to Discharge: Neurogenic Bowel & Bladder;Decreased caregiver support;Lack of/limited family support PT Barriers to Discharge Comments: decreased caregiver support as spouse works during day PT Patient demonstrates impairments in the following area(s): Balance;Pain;Edema;Safety;Endurance;Sensory;Motor;Skin Integrity;Nutrition PT Transfers Functional Problem(s): Bed to  Chair PT Locomotion Functional Problem(s): Wheelchair Mobility PT Plan PT Intensity: Minimum of 1-2 x/day ,45 to 90 minutes PT Frequency: 5 out of 7 days PT Duration Estimated Length of Stay: 7-10 days PT Treatment/Interventions: Discharge planning;Functional mobility training;Psychosocial support;Therapeutic Activities;Balance/vestibular training;Disease management/prevention;Neuromuscular re-education;Therapeutic Exercise;Wheelchair propulsion/positioning;DME/adaptive equipment instruction;Pain management;UE/LE Strength taining/ROM;Community reintegration;Patient/family education;UE/LE Coordination activities PT Transfers Anticipated Outcome(s): Min A PT Locomotion Anticipated Outcome(s): N/A PT Recommendation Recommendations for Other Services: Therapeutic Recreation consult Therapeutic Recreation Interventions: Stress management Follow Up Recommendations: Home health PT Patient destination: Home Equipment Recommended: To be determined Equipment Details: pt owns PWC, manual w/c, BSC, rollator, RW   PT Evaluation Precautions/Restrictions Precautions Precautions: Fall Restrictions Weight Bearing Restrictions: No Pain Interference Pain Interference Pain Effect on Sleep: 4. Almost constantly Pain Interference with Therapy Activities: 4. Almost constantly Pain Interference with Day-to-Day Activities: 4. Almost constantly Home Living/Prior Functioning Home Living Available Help at Discharge: Family;Available PRN/intermittently Type of Home: House Home Access: Other (comment);Elevator Home Layout: Two level;Able to live on main level with bedroom/bathroom Alternate Level Stairs-Number of Steps: 16-18 Bathroom Shower/Tub: Other (comment) (sponge bath at baseline) Bathroom Toilet: Handicapped height Bathroom Accessibility: Yes Additional Comments: was getting HHPT prior to inital [place] With: Spouse Prior Function Level of Independence: Needs assistance with tranfers   Able to Take Stairs?: No Cognition Overall Cognitive Status: History of cognitive impairments - at baseline Arousal/Alertness: Awake/alert Orientation Level: Oriented X4 Memory: Impaired Awareness: Appears intact Problem Solving: Impaired Safety/Judgment: Impaired Sensation Sensation Light Touch: Impaired by gross assessment Additional Comments: numbness & pins  and needles in bilateral feet Coordination Gross Motor Movements are Fluid and Coordinated: No Fine Motor Movements are Fluid and Coordinated: No Coordination and Movement Description: grossly uncoordinated due to increased spasticity, weakness and balance deficits Finger Nose Finger Test: R UE intact, L UE slowed, dysmetria, overshoots Heel Shin Test: R LE decreased ROM and delayed, unable to perform L LE Motor  Motor Motor: Abnormal tone Motor - Skilled Clinical Observations: PMH of Secondary Progressive MS with increased spasticity Motor - Discharge Observations: Generalized weakness and limited with increased spasticity 2/2 progressive MS   Trunk/Postural Assessment  Cervical Assessment Cervical Assessment: Exceptions to [facility] (forward head and right cervical spine rotation) Thoracic Assessment Thoracic Assessment: Exceptions to [facility] (left windswept posture) Lumbar Assessment Lumbar Assessment: Exceptions to [facility] (posterior pelvic tilt) Postural Control Postural Control: Deficits on evaluation Righting Reactions: decreased Protective Responses: decreased  Balance Balance Balance Assessed: Yes Static Sitting Balance Static Sitting - Balance Support: Feet supported Static Sitting - Level of Assistance: 3: Mod assist Extremity Assessment      RLE Assessment RLE Assessment: Exceptions to [facility] General Strength Comments: grossly 2/5 except ankle DF/PF 4/5 RLE Tone RLE Tone: Modified Ashworth Body Part - Modified Ashworth Scale: Hamstrings;Gastrocnemius Hamstrings - Modified Ashworth Scale for Grading Hypertonia RLE:  No increase in muscle tone GASTROCNEMIUS - Modified Ashworth Scale for Grading Hypertonia RLE: No increase in muscle tone LLE Assessment LLE Assessment: Exceptions to [facility] General Strength Comments: hip flex 2/5, knee ext: 2/5, knee flex: 1/5, DF/PF: 1/5 LLE Tone LLE Tone: Modified Ashworth Body Part - Modified Ashworth Scale: Hamstrings;Gastrocnemius Hamstrings - Modified Ashworth Scale for Grading Hypertonia LLE: No increase in muscle tone Gastrocnemius - Modified Ashworth Scale for Grading Hypertonia LLE: Slight increase in muscle tone, manifested by a catch and release or by minimal resistance at the end of the range of motion when the affected part(s) is moved in flexion or extension  Care Tool Care Tool Bed Mobility Roll left and right activity   Roll left and right assist level: Moderate Assistance - Patient 50 - 74%    Sit to lying activity   Sit to lying assist level: Moderate Assistance - Patient 50 - 74%    Lying to sitting on side of bed activity   Lying to sitting on side of bed assist level: the ability to move from lying on the back to sitting on the side of the bed with no back support.: Moderate Assistance - Patient 50 - 74%     Care Tool Transfers Sit to stand transfer   Sit to stand assist level: Moderate Assistance - Patient 50 - 74%    Chair/bed transfer   Chair/bed transfer assist level: Maximal Assistance - Patient 25 - 49%     Toilet transfer Toilet transfer activity did not occur: Safety/medical concerns (unable to perform due to pain, weakness and decreased activity tolerance)      Car transfer Car transfer activity did not occur: Safety/medical concerns (unable to perform due to pain, weakness and decreased activity tolerance)        Care Tool Locomotion Ambulation Ambulation activity did not occur: Safety/medical concerns (unable to perform due to pain, weakness and decreased activity tolerance)        Walk 10 feet activity Walk 10 feet activity  did not occur: Safety/medical concerns (unable to perform due to pain, weakness and decreased activity tolerance)       Walk 50 feet with 2 turns activity Walk 50 feet with 2 turns activity  did not occur: Safety/medical concerns (unable to perform due to pain, weakness and decreased activity tolerance)      Walk 150 feet activity Walk 150 feet activity did not occur: Safety/medical concerns (unable to perform due to pain, weakness and decreased activity tolerance)      Walk 10 feet on uneven surfaces activity Walk 10 feet on uneven surfaces activity did not occur: Safety/medical concerns (unable to perform due to pain, weakness and decreased activity tolerance)      Stairs Stair activity did not occur: Safety/medical concerns (unable to perform due to pain, weakness and decreased activity tolerance)        Walk up/down 1 step activity Walk up/down 1 step or curb (drop down) activity did not occur: Safety/medical concerns (unable to perform due to pain, weakness and decreased activity tolerance)      Walk up/down 4 steps activity Walk up/down 4 steps activity did not occur: Safety/medical concerns (unable to perform due to pain, weakness and decreased activity tolerance)      Walk up/down 12 steps activity Walk up/down 12 steps activity did not occur: Safety/medical concerns (unable to perform due to pain, weakness and decreased activity tolerance)      Pick up small objects from floor Pick up small object from the floor (from standing position) activity did not occur: Safety/medical concerns (unable to perform due to pain, weakness and decreased activity tolerance)      Wheelchair Is the patient using a wheelchair?: Yes Type of Wheelchair: Power   Wheelchair assist level: Supervision/Verbal cueing Max wheelchair distance: 300 ft  Wheel 50 feet with 2 turns activity   Assist Level: Supervision/Verbal cueing  Wheel 150 feet activity   Assist Level: Supervision/Verbal cueing     Refer to Care Plan for Long Term Goals  SHORT TERM GOAL WEEK 1 PT Short Term Goal 1 (Week 1): STG=LTG due ELOS  Recommendations for other services: Therapeutic Recreation  Pet therapy and Stress management  Skilled Therapeutic Intervention  Daily Treatment Session:   Pt received seated in PWC at bedside and agreeable to PT evaluation. Pt requires close supervision for safety for PWC navigation total of ~300 ft throughout session. Pt requires mod A with sit to stand and with stand pivot transfer with RW to mat, pt displays left knee instability/buckling. Pt required mod A for static sitting balance edge of mat and for sit to lying on mat table. PT assessed pain interference, sensation, coordination, strength, and spasticity supine on mat. Pt requires mod A for supine to sit and attempted return stand pivot transfer with RW to [place] and pt unsuccessful. Pt agreeable to squat pivot transfer and required max A for balance and safety. Pt navigated PWC to room and pt left seated in PWC at bedside with all needs in reach.    Mobility Bed Mobility Bed Mobility: Rolling Left;Supine to Sit;Sit to Supine Rolling Right: Moderate Assistance - Patient 50-74% Supine to Sit: Moderate Assistance - Patient 50-74% Sit to Supine: Moderate Assistance - Patient 50-74% Transfers Transfers: Set designer Transfers;Sit to Stand;Stand to Sit Sit to Stand: Moderate Assistance - Patient 50-74% Stand to Sit: Moderate Assistance - Patient 50-74% Stand Pivot Transfers: Maximal Assistance - Patient 25 - 49% Stand Pivot Transfer Details: Manual facilitation for placement;Verbal cues for safe use of DME/AE;Verbal cues for precautions/safety Squat Pivot Transfers: Maximal Assistance - Patient 25-49% Transfer (Assistive device): Rolling walker Locomotion  Gait Ambulation: No Gait Gait: No Stairs / Additional Locomotion Stairs: No Wheelchair Mobility Wheelchair Mobility: Yes  Wheelchair Assistance:  Chartered loss adjuster: Power Wheelchair Parts Management: Supervision/cueing Distance: 300 ft   Discharge Criteria: Patient will be discharged from PT if patient refuses treatment 3 consecutive times without medical reason, if treatment goals not met, if there is a change in medical status, if patient makes no progress towards goals or if patient is discharged from hospital.  The above assessment, treatment plan, treatment alternatives and goals were discussed and mutually agreed upon: by patient  Tanja Port PT, DPT  04/04/2022, 12:25 PM

## 2022-04-04 NOTE — Progress Notes (Signed)
Inpatient Rehabilitation Center Individual Statement of Services  Patient Name:  Sharon Odom  Date:  04/04/2022  Welcome to the Inpatient Rehabilitation Center.  Our goal is to provide you with an individualized program based on your diagnosis and situation, designed to meet your specific needs.  With this comprehensive rehabilitation program, you will be expected to participate in at least 3 hours of rehabilitation therapies Monday-Friday, with modified therapy programming on the weekends.  Your rehabilitation program will include the following services:  Physical Therapy (PT), Occupational Therapy (OT), Speech Therapy (ST), 24 hour per day rehabilitation nursing, Therapeutic Recreaction (TR), Neuropsychology, Care Coordinator, Rehabilitation Medicine, Nutrition Services, and Pharmacy Services  Weekly team conferences will be held on Tuesday to discuss your progress.  Your Inpatient Rehabilitation Care Coordinator will talk with you frequently to get your input and to update you on team discussions.  Team conferences with you and your family in attendance may also be held.  Expected length of stay: 7-10 days  Overall anticipated outcome: min assist wheelchair level  Depending on your progress and recovery, your program may change. Your Inpatient Rehabilitation Care Coordinator will coordinate services and will keep you informed of any changes. Your Inpatient Rehabilitation Care Coordinator's name and contact numbers are listed  below.  The following services may also be recommended but are not provided by the Inpatient Rehabilitation Center:   Home Health Rehabiltiation Services Outpatient Rehabilitation Services    Arrangements will be made to provide these services after discharge if needed.  Arrangements include referral to agencies that provide these services.  Your insurance has been verified to be:  BCBS Your primary doctor is:  Carmin Richmond  Pertinent information will be shared  with your doctor and your insurance company.  Inpatient Rehabilitation Care Coordinator:  Dossie Der, [place] [phone] or Luna [place]  Information discussed with and copy given to patient by: Lucy Chris, 04/04/2022, 8:31 AM

## 2022-04-04 NOTE — Progress Notes (Signed)
Inpatient Rehabilitation  Patient information reviewed and entered into eRehab system by Shahab Polhamus Nekisha Mcdiarmid, OTR/L, Rehab Quality Coordinator.   Information including medical coding, functional ability and quality indicators will be reviewed and updated through discharge.   

## 2022-04-04 NOTE — Progress Notes (Signed)
Patient ID: Sharon Odom, female   DOB: 06-10-1970, 51 y.o.   MRN: [id number]   Inpatient Rehabilitation Care Coordinator Assessment and Plan Patient Details  Name: Sharon Odom MRN: [id number] Date of Birth: 1970/11/30   Today's Date: 03/14/2022   Hospital Problems: Principal Problem:   Multiple sclerosis (HCC) Active Problems:   S/P insertion of intrathecal pump   Past Medical History:      Past Medical History:  Diagnosis Date   Allergies     Anxiety     Arthritis     Asthma      seasonal - pollen   Constipation     Depression     Dyspnea      with exertion   GERD (gastroesophageal reflux disease)     Headache     History of hiatal hernia     Left radial nerve palsy 03/15/2020    resolved - Left arm/hand weak   Multiple sclerosis (HCC)     Pneumonia 2007    x 1   Vision abnormalities     Wears glasses      Past Surgical History:       Past Surgical History:  Procedure Laterality Date   COLONOSCOPY       EXCISION MORTON'S NEUROMA Right     fallopian tube removal       INTRATHECAL PUMP IMPLANT Right 02/18/2019    Procedure: INTRATHECAL PUMP IMPLANT;  Surgeon: Odette Fraction, MD;  Location: [facility] OR;  Service: Neurosurgery;  Laterality: Right;  INTRATHECAL PUMP IMPLANT   INTRATHECAL PUMP IMPLANT Right 02/17/2021    Procedure: Baclofen Pump Replacement;  Surgeon: Maeola Harman, MD;  Location: [facility] OR;  Service: Neurosurgery;  Laterality: Right;  3C/RM 21   INTRATHECAL PUMP REVISION N/A 01/11/2022    Procedure: Baclofen Pump revision;  Surgeon: Dawley, Alan Mulder, DO;  Location: [facility] OR;  Service: Neurosurgery;  Laterality: N/A;  RM 21 to follow   PAIN PUMP IMPLANTATION N/A 09/28/2021    Procedure: Insertion of baclofen pump, right lower quadrant;  Surgeon: Dawley, Alan Mulder, DO;  Location: [facility] OR;  Service: Neurosurgery;  Laterality: N/A;   PAIN PUMP REMOVAL Left 04/06/2021    Procedure: Removal of baclofen pump, Left;  Surgeon: Dawley, Alan Mulder, DO;  Location: [facility] OR;  Service:  Neurosurgery;  Laterality: Left;  Lateral/Left//3C rm 19   UPPER GI ENDOSCOPY       WISDOM TOOTH EXTRACTION       WISDOM TOOTH EXTRACTION        Social History:  reports that she has been smoking cigarettes. She started smoking about 35 years ago. She has a 16.00 pack-year smoking history. She has never used smokeless tobacco. She reports that she does not currently use alcohol. She reports that she does not use drugs.   Family / Support Systems Marital Status: Married Patient Roles: Spouse Spouse/Significant Other: Sharon Odom [phone] Other Supports: Sharon Odom-mother in-law [phone] Anticipated Caregiver: Sharon Odom Ability/Limitations of Caregiver: Works days pt alone during the day Caregiver Availability: Evenings only Family Dynamics: Close with husband and mother in-law pt tries to be as independent as possible with her deficits. She is not one to ask others for help but would rather figure out a way on her own   Social History Preferred language: English Religion:  Cultural Background: No issues Education: College educated Health Literacy - How often do you need to have someone help you when you read instructions, pamphlets, or other written material from your doctor or pharmacy?: Never Writes: Yes  Employment Status: Disabled Public relations account executive Issues: No issues Guardian/Conservator: None-according to MD pt is capable of making his own decisions while here. Husband wil be here in the evenings    Abuse/Neglect Abuse/Neglect Assessment Can Be Completed: Yes Physical Abuse: Denies Verbal Abuse: Denies Sexual Abuse: Denies Exploitation of patient/patient's resources: Denies Self-Neglect: Denies   Patient response to: Social Isolation - How often do you feel lonely or isolated from those around you?: Rarely   Emotional Status Pt's affect, behavior and adjustment status: Pt is hoping with her being here and adjusting her bacolofen pump it will help her to become more  independent. She feels she will do her part but hopeful she can be better than she was prior to admission Recent Psychosocial Issues: other health issues Psychiatric History: Hx-depressiion/anxiety takes medications for this and finds them helpful. Have placed pt on neuro-psch list to be seen while here. Substance Abuse History: Tobacco aware of health risks and resources available to her   Patient / Family Perceptions, Expectations & Goals Pt/Family understanding of illness & functional limitations: Pt is able to explain her MS and issues regarding this. She has had this since 2013. She is followed by Dr Dagoberto Ligas in the clinic and feels has a good understanding of her plan moving forward. Premorbid pt/family roles/activities: Wife, daughter in-law, retiree, etc Anticipated changes in roles/activities/participation: resume Pt/family expectations/goals: Pt states: " I hope to do more for myself when I leave here if this pump can work better."   [employer]: Other (Comment) Premorbid Home Care/DME Agencies: Other (Comment) (Active pt with [facility]-PT has all DME) Transportation available at discharge: husband Is the patient able to respond to transportation needs?: Yes In the past 12 months, has lack of transportation kept you from medical appointments or from getting medications?: No In the past 12 months, has lack of transportation kept you from meetings, work, or from getting things needed for daily living?: No Resource referrals recommended: Neuropsychology   Discharge Planning Living Arrangements: Spouse/significant other Support Systems: Spouse/significant other, Other relatives Type of Residence: Private residence Insurance Resources: Multimedia programmer (specify) Financial Resources: SSD, Family Support Financial Screen Referred: No Living Expenses: Own Money Management: Family Does the patient have any problems obtaining your medications?: No Home Management:  husband Patient/Family Preliminary Plans: Return home with husband who is there in the evenings but needs to work during the day. Pt has been alone and according to her safe at home. Will await therapy evaluations and work on discharge needs. Care Coordinator Barriers to Discharge: Lack of/limited family support, Insurance for SNF coverage Care Coordinator Anticipated Follow Up Needs: HH/OP   Clinical Impression Pleasant female who does well directing her care. She is motivated to do well here and hopeful with the adjustment of the baclofen pump she will do well. Being evaluated today and goals being set. Have placed on the neuro-psych list to be seen. Will await therapist's evaluations.     Addendum: Pt transferred back to CIR will continue to follow to provide support and assist with discharge needs. Pt very happy to be back. Elease Hashimoto

## 2022-04-04 NOTE — Progress Notes (Signed)
PROGRESS NOTE   Subjective/Complaints:  New complaint of RLE nerve pain- says it feels like it's on fire and prickly and stinging-  Says it's gotten MUCH worse since on air mattress.   Also feels like has a lead boot on her feet "1/2 of lead boot"    ROS:  Pt denies SOB, abd pain, CP, N/V/C/D, and vision changes Except for HPI  Objective:   No results found. Recent Labs    04/02/22 0247 04/04/22 0549  WBC 8.8 10.8*  HGB 10.9* 11.0*  HCT 35.1* 34.7*  PLT 306 318   Recent Labs    04/02/22 0247 04/03/22 0546 04/04/22 0549  NA 141  --  142  K 4.6  --  4.1  CL 109  --  106  CO2 27  --  27  GLUCOSE 95  --  106*  BUN 12  --  12  CREATININE 0.76 0.71 0.65  CALCIUM 9.4  --  9.8    Intake/Output Summary (Last 24 hours) at 04/04/2022 0906 Last data filed at 04/04/2022 7322 Gross per 24 hour  Intake 120 ml  Output 1000 ml  Net -880 ml     Pressure Injury 03/31/22 Sacrum Medial Deep Tissue Pressure Injury - Purple or maroon localized area of discolored intact skin or blood-filled blister due to damage of underlying soft tissue from pressure and/or shear. (Active)  03/31/22 0815  Location: Sacrum  Location Orientation: Medial  Staging: Deep Tissue Pressure Injury - Purple or maroon localized area of discolored intact skin or blood-filled blister due to damage of underlying soft tissue from pressure and/or shear.  Wound Description (Comments):   Present on Admission: Yes     Pressure Injury 04/03/22 Thigh Left;Posterior;Proximal Stage 2 -  Partial thickness loss of dermis presenting as a shallow open injury with a red, pink wound bed without slough. red pink (Active)  04/03/22 1751  Location: Thigh  Location Orientation: Left;Posterior;Proximal  Staging: Stage 2 -  Partial thickness loss of dermis presenting as a shallow open injury with a red, pink wound bed without slough.  Wound Description (Comments): red pink   Present on Admission: Yes     Pressure Injury 04/03/22 Ankle Left;Posterior Deep Tissue Pressure Injury - Purple or maroon localized area of discolored intact skin or blood-filled blister due to damage of underlying soft tissue from pressure and/or shear. 1x1 (Active)  04/03/22 1752  Location: Ankle  Location Orientation: Left;Posterior  Staging: Deep Tissue Pressure Injury - Purple or maroon localized area of discolored intact skin or blood-filled blister due to damage of underlying soft tissue from pressure and/or shear.  Wound Description (Comments): 1x1  Present on Admission: Yes    Physical Exam: Vital Signs Blood pressure 100/68, pulse 75, temperature (!) 97.5 F (36.4 C), temperature source Oral, resp. rate 14, height 5\' 9"  (1.753 m), weight 56.2 kg, SpO2 96 %.    General: awake, alert, irritable; NAD HENT: conjugate gaze; oropharynx moist CV: regular rate; no JVD Pulmonary: CTA B/L; no W/R/R- good air movement GI: soft, NT, ND, (+)BS- ITB pump on R side Psychiatric: irritable about bed and very anxious this AM Neurological: keep repeats herself- not sure if  remembers she said things already or not Musculoskeletal:     Cervical back: Neck supple.     Comments: Able to lift head and move LUE and head around more like her baseline RUE very strong- basically 5/5 Difficult to test on RLE due to tone LLE- back to baseline  Skin:    General: Skin is warm and dry.     Comments: New DTI on coccyx  Also worsening skin open/pressure Stage II on posterior thigh- on R thigh Also small DTI on R heel  Neurological:     Mental Status: She is alert.     Comments: MAS of 3 in RLE with significant extensor tone MAS of 1+ in LLE with a few beats clonus on LLE and too tight to trigger clonus on RLE LUE- normal tone,- usually flaccid RUE- normal tone- normally normal    Assessment/Plan: 1. Functional deficits which require 3+ hours per day of interdisciplinary therapy in a  comprehensive inpatient rehab setting. Physiatrist is providing close team supervision and 24 hour management of active medical problems listed below. Physiatrist and rehab team continue to assess barriers to discharge/monitor patient progress toward functional and medical goals  Care Tool:  Bathing              Bathing assist       Upper Body Dressing/Undressing Upper body dressing        Upper body assist      Lower Body Dressing/Undressing Lower body dressing            Lower body assist       Toileting Toileting    Toileting assist       Transfers Chair/bed transfer  Transfers assist           Locomotion Ambulation   Ambulation assist              Walk 10 feet activity   Assist           Walk 50 feet activity   Assist           Walk 150 feet activity   Assist           Walk 10 feet on uneven surface  activity   Assist           Wheelchair     Assist               Wheelchair 50 feet with 2 turns activity    Assist            Wheelchair 150 feet activity     Assist          Blood pressure 100/68, pulse 75, temperature (!) 97.5 F (36.4 C), temperature source Oral, resp. rate 14, height 5\' 9"  (1.753 m), weight 56.2 kg, SpO2 96 %.  Medical Problem List and Plan: 1. Functional deficits due to secondary progressive multiple sclerosis and debility from prolonged admission on acute floor for encephalopathy             -patient may  shower             -ELOS/Goals: min A to supervision- 7-10 days  First day of evaluations- con't CIR- PT, OT and SLP 2.  Antithrombotics: -DVT/anticoagulation:  Pharmaceutical: Lovenox             -antiplatelet therapy: none 3. Pain Management:              -Naproxen 500 mg twice daily             -  Tylenol as needed             -Oxycodone 10 mg every 4 hours as needed- will change to Nucynta 50 mg q4 hours - will not add Methadone due to chance of  polypharmacy that might have occurred last week in addition to Cefepime encephalopathy.   11/8- explained to pt changed to Nucynta yesterday evening- it takes a few days for nerve pain meds to kick in- and cannot change doses today- will increase ITB pump tomorrow 4. Mood/Behavior/Sleep: LCSW to evaluate and provide emotional support             -antipsychotic agents: n/a             -Wellbutrin XL 300 mg daily             -amantadine 100 mg TID- per Dr Epimenio Foot 5. Neuropsych/cognition: This patient is capable of making decisions on her own behalf. 6. Skin/Wound Care: Routine skin care checks  11/7- has new DTI on coccyx, worsening now stage II on posterior R thigh and small DTI R heel- added prevalon boots in bed; pt will NOT use air mattress- changing to regular bed due to pt insistence; and foam dressing and turn q2 hours.  7. Fluids/Electrolytes/Nutrition: Routine Is and Os and follow-up chemistries             -continue Vitamin C, D3, B12 and fiber supplements 8: MS with spasticity: continue dalfampridine BID and [place]              -Baclofen 20 mg twice daily, 40 mg every morning             -Valium 5 mg every 8 hours             -Zanaflex 4 mg twice daily- were reduced due to cognition/sedation on acute- will change out /refill pump Thursday as well as increase dosing of ITB pump  11/8- spasticity slightly more than normal, however increasing ITB pump tomorrow, so will not increase spasticity PO meds 9: GERD: restart Zantac 10: Neurogenic bladder with indwelling Foley; s/p UTI treatment with Zosyn 11: Bowel program: Nutrisource fiber, psyllium caps, Colace, MiraLax, Senekot-S given daily             -placed PRN sorbitol and Fleets orders- has gotten really constipated/needing multiple enemas-  12: Dysphagia: ? resolved; now on regular diet; SLP eval 13: Seasonal allergies off home meds: monitor 14: Tooth abscess: missed follow-up appointment; completed Flagyl 15: Hypokalemia: on K+  supplement QOD; follow-up BMP tomorrow- likely due to previous Lasix dose for Edema.    11/8- K+ 4.1  I spent a total of 38   minutes on total care today- >50% coordination of care- due to prolonged d/w nursing about behavior modifications necessary- will also change to regular bed per pt request and order Prevalon boots B/L since has DTI on R heel- small.        LOS: 1 days A FACE TO FACE EVALUATION WAS PERFORMED  Malaquias Lenker 04/04/2022, 9:06 AM

## 2022-04-04 NOTE — Plan of Care (Signed)
  Problem: RH Balance Goal: LTG: Patient will maintain dynamic sitting balance (OT) Description: LTG:  Patient will maintain dynamic sitting balance with assistance during activities of daily living (OT) Flowsheets (Taken 04/04/2022 1921) LTG: Pt will maintain dynamic sitting balance during ADLs with: Contact Guard/Touching assist   Problem: Sit to Stand Goal: LTG:  Patient will perform sit to stand in prep for activites of daily living with assistance level (OT) Description: LTG:  Patient will perform sit to stand in prep for activites of daily living with assistance level (OT) Flowsheets (Taken 04/04/2022 1921) LTG: PT will perform sit to stand in prep for activites of daily living with assistance level: Minimal Assistance - Patient > 75%   Problem: RH Toileting Goal: LTG Patient will perform toileting task (3/3 steps) with assistance level (OT) Description: LTG: Patient will perform toileting task (3/3 steps) with assistance level (OT)  Flowsheets (Taken 04/04/2022 1921) LTG: Pt will perform toileting task (3/3 steps) with assistance level: Minimal Assistance - Patient > 75%   Problem: RH Simple Meal Prep Goal: LTG Patient will perform simple meal prep w/assist (OT) Description: LTG: Patient will perform simple meal prep with assistance, with/without cues (OT). Flowsheets (Taken 04/04/2022 1921) LTG: Pt will perform simple meal prep with assistance level of: Supervision/Verbal cueing LTG: Pt will perform simple meal prep w/level of: Wheelchair level   Problem: RH Toilet Transfers Goal: LTG Patient will perform toilet transfers w/assist (OT) Description: LTG: Patient will perform toilet transfers with assist, with/without cues using equipment (OT) Flowsheets (Taken 04/04/2022 1921) LTG: Pt will perform toilet transfers with assistance level of: Minimal Assistance - Patient > 75%

## 2022-04-04 NOTE — Progress Notes (Signed)
Inpatient Rehabilitation Admission Medication Review by a Pharmacist  A complete drug regimen review was completed for this patient to identify any potential clinically significant medication issues.  High Risk Drug Classes Is patient taking? Indication by Medication  Antipsychotic Yes Compazine - prn N/V  Anticoagulant Yes Lovenox - VTE ppx  Antibiotic No   Opioid Yes Oxycodone - prn pain  Antiplatelet No   Hypoglycemics/insulin No   Vasoactive Medication No   Chemotherapy No   Other Yes Amantadine - alertness  Baclofen, tizanidine, diazepam- muscle spasms  Bupropion - mood Dalfampridine - MS Naproxen - pain Tropsium - incontinence Trazodone, zolpidem - sleep Potassium - supplement  Baclofen pump - spasms     Type of Medication Issue Identified Description of Issue Recommendation(s)  Drug Interaction(s) (clinically significant)     Duplicate Therapy     Allergy     No Medication Administration End Date     Incorrect Dose     Additional Drug Therapy Needed     Significant med changes from prior encounter (inform family/care partners about these prior to discharge).    Other       Clinically significant medication issues were identified that warrant physician communication and completion of prescribed/recommended actions by midnight of the next day:  No   Pharmacist comments: Baclofen pump planned for 11/9  Time spent performing this drug regimen review (minutes):  30 minutes  Thank you. Okey Regal, PharmD

## 2022-04-04 NOTE — Progress Notes (Signed)
Orthopedic Tech Progress Note Patient Details:  Timika Muench [birth year] [phone]  Called floor to let RN know that MD had ordered prevalon boots are in materials and not ordered through ORTHO    Patient ID: Donita Brooks, female   DOB: [birth year], 51 y.o.   MRN: [id number]  Donald Pore 04/04/2022, 8:49 AM

## 2022-04-04 NOTE — Evaluation (Signed)
Speech Language Pathology Assessment and Plan  Patient Details  Name: Sharon Odom MRN: [id number] Date of Birth: Oct 03, 1970  SLP Diagnosis: Cognitive Impairments  Rehab Potential: Good ELOS: 7-10 days    Today's Date: 04/04/2022 SLP Individual Time: 7062-3762 SLP Individual Time Calculation (min): 79 min   Hospital Problem: Principal Problem:   Multiple sclerosis ([place]) Active Problems:   Spasticity   Pressure injury of skin  Past Medical History:  Past Medical History:  Diagnosis Date   Allergies    Anxiety    Arthritis    Asthma    seasonal - pollen   Constipation    Depression    Dyspnea    with exertion   GERD (gastroesophageal reflux disease)    Headache    History of hiatal hernia    Left radial nerve palsy 03/15/2020   resolved - Left arm/hand weak   Multiple sclerosis ([place])    Pneumonia 2007   x 1   Vision abnormalities    Wears glasses    Past Surgical History:  Past Surgical History:  Procedure Laterality Date   COLONOSCOPY     EXCISION MORTON'S NEUROMA Right    fallopian tube removal     INTRATHECAL PUMP IMPLANT Right 02/18/2019   Procedure: INTRATHECAL PUMP IMPLANT;  Surgeon: Clydell Hakim, MD;  Location: [place];  Service: Neurosurgery;  Laterality: Right;  INTRATHECAL PUMP IMPLANT   INTRATHECAL PUMP IMPLANT Right 02/17/2021   Procedure: Baclofen Pump Replacement;  Surgeon: Erline Levine, MD;  Location: [place];  Service: Neurosurgery;  Laterality: Right;  3C/RM 21   INTRATHECAL PUMP REVISION N/A 01/11/2022   Procedure: Baclofen Pump revision;  Surgeon: Dawley, Theodoro Doing, DO;  Location: [place];  Service: Neurosurgery;  Laterality: N/A;  RM 21 to follow   PAIN PUMP IMPLANTATION N/A 09/28/2021   Procedure: Insertion of baclofen pump, right lower quadrant;  Surgeon: Dawley, Theodoro Doing, DO;  Location: [place];  Service: Neurosurgery;  Laterality: N/A;   PAIN PUMP REMOVAL Left 04/06/2021   Procedure: Removal of baclofen pump, Left;  Surgeon: Dawley, Theodoro Doing, DO;   Location: [place];  Service: Neurosurgery;  Laterality: Left;  Lateral/Left//3C rm 19   UPPER GI ENDOSCOPY     WISDOM TOOTH EXTRACTION     WISDOM TOOTH EXTRACTION      Assessment / Plan / Recommendation Clinical Impression  51 year old female with history of thrombocytopenia from [place], anxiety, asthma and chronic pain, progressive MS , s/p IT pump replacement 8/23. Initially diagnosis with MS 2013. IT pump removed 04/06/21 due to pump infection. Recent admission to [facility] 03/13/22 to fine tune ITB pump as could not be done in visits to outpatient clinic as well as rehabilitation to improve her overall level of function. [name], she had progressive weakness. Neuro lt consulted and recommended high dose IV steroids. MRI showed new demyelinating lesions. Patient did develop fever with U/A positive for Klebsiella pneumoniae and enterobacter . [name]. Readmitted to acute hospital on 03/27/22.Neurology consult. Felt MRI showed faint punctuate enhancing lesions on a background of lesions consistent with MS, but lesion did not explain current left sided weakness. Patient has been on multiple sedating medications. Felt likely infection causing worsening of pre-existing deficits, as well as oversedation from multiple sedating meds. Steroids placed on hold, and sedating meds decreased. Antibiotic changed to Zosyn for 5 day course in the setting of encephalopathy. Palliative care consulted to begin goals of care discussions for future recommendations. Admitted  to CIR on 04/03/22.   Pt presents with mild deficits in short term recall, pt reports exacerbation during hospital admission from baseline deficit. Pt scored mild deficits in memory on formal cognitive linguistic assessment CLQT. Pt was unable to participate in other subsection due to consuming lunch during evaluation, but reports no acute deficits in problem solving, attention and awareness (SLP supports this from  informal conversation.) Pt supports no acute changes in swallow nor speech function. Pt has a baseline hyponasal mildly wet and low vocal intensity vocal quality. Pt recalled swallow strategies with supervision A verbal cues and demonstrated no overt s/s aspiration during informal assessment of swallow function. Pt would benefit from x1 f/u ST to provide education on memory strategies prior to discharge with no f/u likely needed, except if OPST is desired with further exacerbation of deficits.   Skilled Therapeutic Interventions          Skilled ST services focused on cognitive skills. SLP facilitated administration of cognitive linguistic formal assessment and provided education of results. SLP and pt collaborated to set goals for cognitive linguistic needs during length of stay. All questions answered to satisfaction.  Pt was left in room with call bell within reach and chair alarm set. SLP recommends to continue skilled services.    SLP Assessment  Patient will need skilled Speech Lanaguage Pathology Services during CIR admission    Recommendations  SLP Diet Recommendations: Thin;Age appropriate regular solids Liquid Administration via: Straw Medication Administration: Whole meds with puree Supervision: Patient able to self feed;Intermittent supervision to cue for compensatory strategies Compensations: Minimize environmental distractions;Slow rate;Small sips/bites;Effortful swallow;Multiple dry swallows after each bite/sip Postural Changes and/or Swallow Maneuvers: Seated upright 90 degrees;Upright 30-60 min after meal Oral Care Recommendations: Oral care BID Patient destination: Home Follow up Recommendations: 24 hour supervision/assistance;Outpatient SLP Equipment Recommended: None recommended by SLP    SLP Frequency 1 to 3 out of 7 days   SLP Duration  SLP Intensity  SLP Treatment/Interventions 7-10 days  Minumum of 1-2 x/day, 30 to 90 minutes  Patient/family  education;Internal/external aids    Pain Pain Assessment Pain Scale: 0-10 Pain Score: 0-No pain Pain Type: Chronic pain Pain Location: Generalized Pain Descriptors / Indicators: Aching;Burning Pain Frequency: Constant Pain Onset: On-going Pain Intervention(s): Medication (See eMAR)  Prior Functioning Cognitive/Linguistic Baseline: Baseline deficits Baseline deficit details: memory Type of Home: House  Lives With: Spouse Available Help at Discharge: Family;Available PRN/intermittently  SLP Evaluation Cognition Overall Cognitive Status: History of cognitive impairments - at baseline Arousal/Alertness: Awake/alert Orientation Level: Oriented X4 Memory: Impaired Memory Impairment: Decreased recall of new information Awareness: Appears intact Problem Solving: Appears intact Safety/Judgment: Appears intact  Comprehension Auditory Comprehension Overall Auditory Comprehension: Appears within functional limits for tasks assessed Expression Expression Primary Mode of Expression: Verbal Verbal Expression Overall Verbal Expression: Impaired Level of Generative/Spontaneous Verbalization: Conversation Interfering Components: Speech intelligibility Written Expression Dominant Hand: Right Oral Motor Oral Motor/Sensory Function Overall Oral Motor/Sensory Function: Within functional limits Motor Speech Overall Motor Speech: Impaired Respiration: Within functional limits Phonation: Wet;Low vocal intensity Resonance: Hyponasality Articulation: Within functional limitis Intelligibility: Intelligibility reduced Word: 75-100% accurate Phrase: 75-100% accurate Sentence: 75-100% accurate Conversation: 75-100% accurate Motor Planning: Witnin functional limits Motor Speech Errors: Not applicable Effective Techniques: Amplifier  Care Tool Care Tool Cognition Ability to hear (with hearing aid or hearing appliances if normally used Ability to hear (with hearing aid or hearing  appliances if normally used): 0. Adequate - no difficulty in normal conservation, social interaction, listening to TV  Expression of Ideas and Wants Expression of Ideas and Wants: 3. Some difficulty - exhibits some difficulty with expressing needs and ideas (e.g, some words or finishing thoughts) or speech is not clear   Understanding Verbal and Non-Verbal Content Understanding Verbal and Non-Verbal Content: 4. Understands (complex and basic) - clear comprehension without cues or repetitions  Memory/Recall Ability Memory/Recall Ability : Current season;Staff names and faces;That he or she is in a hospital/hospital unit   PMSV Assessment  PMSV Trial Intelligibility: Intelligibility reduced Word: 75-100% accurate Phrase: 75-100% accurate Sentence: 75-100% accurate Conversation: 75-100% accurate   Short Term Goals: Week 1: SLP Short Term Goal 1 (Week 1): Pt will participate in education and demonstrate use of internal/external recall strategies with supervision A verbal cues.  Refer to Care Plan for Long Term Goals  Recommendations for other services: None   Discharge Criteria: Patient will be discharged from SLP if patient refuses treatment 3 consecutive times without medical reason, if treatment goals not met, if there is a change in medical status, if patient makes no progress towards goals or if patient is discharged from hospital.  The above assessment, treatment plan, treatment alternatives and goals were discussed and mutually agreed upon: by patient  Anup Brigham  [facility] 04/04/2022, 2:43 PM

## 2022-04-04 NOTE — Discharge Instructions (Addendum)
Inpatient Rehab Discharge Instructions  Tate Jerkins Discharge date and time:  05/04/2022  Activities/Precautions/ Functional Status: Activity: no lifting, driving, or strenuous exercise until cleared by MD Diet: regular diet Wound Care: sacral wound care Functional status:  ___ No restrictions     ___ Walk up steps independently __x_ 24/7 supervision/assistance   ___ Walk up steps with assistance ___ Intermittent supervision/assistance  ___ Bathe/dress independently ___ Walk with walker     ___ Bathe/dress with assistance ___ Walk Independently    ___ Shower independently ___ Walk with assistance    __x_ Shower with assistance __x_ No alcohol     ___ Return to work/school ________  Special Instructions: No driving, alcohol consumption or tobacco use.   COMMUNITY REFERRALS UPON DISCHARGE:    Medical Equipment/Items Ordered: HAS ALL DME FROM PREVIOUS ADMISSIONS                                                 Agency/Supplier:NA  GAVE PATIENT PRIVATE DUTY LIST IN CASE WANTS TO HIRE ASSIST AT HOME  GENERAL COMMUNITY RESOURCES FOR PATIENT/FAMILY: Support Groups:[employer]PPORT [employer]   My questions have been answered and I understand these instructions. I will adhere to these goals and the provided educational materials after my discharge from the hospital.  Patient/Caregiver Signature _______________________________ Date __________  Clinician Signature _______________________________________ Date __________  Please bring this form and your medication list with you to all your follow-up doctor's appointments.

## 2022-04-04 NOTE — Evaluation (Signed)
Occupational Therapy Assessment and Plan  Patient Details  Name: Sharon Odom MRN: [id number] Date of Birth: Feb 28, 1971  OT Diagnosis: abnormal posture, altered mental status, cognitive deficits, muscular wasting and disuse atrophy, muscle weakness (generalized), progressive muscular atrophy, pain and tone deficits.   Rehab Potential: Rehab Potential (ACUTE ONLY): Fair ELOS: 7-10 days   Today's Date: 04/04/2022 OT Individual Time: 5732-2025 OT Individual Time Calculation (min): 75 min     Hospital Problem: Principal Problem:   Multiple sclerosis ([place]) Active Problems:   Spasticity   Pressure injury of skin   Past Medical History:  Past Medical History:  Diagnosis Date   Allergies    Anxiety    Arthritis    Asthma    seasonal - pollen   Constipation    Depression    Dyspnea    with exertion   GERD (gastroesophageal reflux disease)    Headache    History of hiatal hernia    Left radial nerve palsy 03/15/2020   resolved - Left arm/hand weak   Multiple sclerosis ([place])    Pneumonia 2007   x 1   Vision abnormalities    Wears glasses    Past Surgical History:  Past Surgical History:  Procedure Laterality Date   COLONOSCOPY     EXCISION MORTON'S NEUROMA Right    fallopian tube removal     INTRATHECAL PUMP IMPLANT Right 02/18/2019   Procedure: INTRATHECAL PUMP IMPLANT;  Surgeon: Clydell Hakim, MD;  Location: [place];  Service: Neurosurgery;  Laterality: Right;  INTRATHECAL PUMP IMPLANT   INTRATHECAL PUMP IMPLANT Right 02/17/2021   Procedure: Baclofen Pump Replacement;  Surgeon: Erline Levine, MD;  Location: [place];  Service: Neurosurgery;  Laterality: Right;  3C/RM 21   INTRATHECAL PUMP REVISION N/A 01/11/2022   Procedure: Baclofen Pump revision;  Surgeon: Dawley, Theodoro Doing, DO;  Location: [place];  Service: Neurosurgery;  Laterality: N/A;  RM 21 to follow   PAIN PUMP IMPLANTATION N/A 09/28/2021   Procedure: Insertion of baclofen pump, right lower quadrant;  Surgeon: Dawley,  Theodoro Doing, DO;  Location: [place];  Service: Neurosurgery;  Laterality: N/A;   PAIN PUMP REMOVAL Left 04/06/2021   Procedure: Removal of baclofen pump, Left;  Surgeon: Dawley, Theodoro Doing, DO;  Location: [place];  Service: Neurosurgery;  Laterality: Left;  Lateral/Left//3C rm 19   UPPER GI ENDOSCOPY     WISDOM TOOTH EXTRACTION     WISDOM TOOTH EXTRACTION      Assessment & Plan Clinical Impression: Patient is a 51 year old female with a history of secondary progressive multiple sclerosis. She has had significant left greater than right spasticity with subsequent implantation of intrathecal pump for baclofen administration. She underwent revision of the abdominal portion of the pump by Dr. Reatha Armour on 01/11/2022. At the time of follow up on 10/6, she reported worsening pain especially when sitting despite increase in 5% increase in baclofen pump. She had been taking oral baclofen 20 mg six/day, oxycodone 10 mg six/day and on Dantrolene 100 mg BID. She was admitted to inpatient rehab on 03/13/2022. Pain medication and baclofen orally and IT were adjusted. Foley cath removed and replaced and urine culture reveled Klebsiella and Enterobacter. Macrodantin was discontinued and she was started on cefepime due to fever spike to 101.2 on 10/28. Nucynta started. BC negative. MRI performed on 10/30 showed acute demyelination and Solumedrol 1000 mg daily for 5 days started on 10/31 now back on CIR unit after stabilization on 04/03/2022.   Patient currently requires max  with basic self-care skills and IADL secondary to muscle weakness, muscle joint tightness, and muscle paralysis, impaired timing and sequencing, abnormal tone, unbalanced muscle activation, and decreased coordination, decreased memory, and decreased sitting balance, decreased standing balance, decreased postural control, and decreased balance strategies.  Prior to hospitalization, patient could complete basic self care and toileting incl transfers with min A with  min.  Patient will benefit from skilled intervention to decrease level of assist with basic self-care skills, increase independence with basic self-care skills, and increase level of independence with iADL prior to discharge home with care partner.  Anticipate patient will require 24 hour supervision and moderate physical assestance and follow up home health.  OT - End of Session Activity Tolerance: Tolerates 30+ min activity with multiple rests Endurance Deficit: Yes Endurance Deficit Description: decreased OT Assessment Rehab Potential (ACUTE ONLY): Fair OT Barriers to Discharge: Home environment access/layout;Inaccessible home environment;Decreased caregiver support;Other (comments) OT Barriers to Discharge Comments: pain, husband works FT, unable to access 2nd floor OT Patient demonstrates impairments in the following area(s): Balance;Endurance;Motor;Cognition;Sensory;Pain;Skin Integrity OT Basic ADL's Functional Problem(s): Grooming;Bathing;Dressing;Toileting OT Advanced ADL's Functional Problem(s): Simple Meal Preparation OT Transfers Functional Problem(s): Toilet;Tub/Shower OT Additional Impairment(s): Fuctional Use of Upper Extremity OT Plan OT Intensity: Minimum of 1-2 x/day, 45 to 90 minutes OT Frequency: 5 out of 7 days OT Duration/Estimated Length of Stay: 7-10 days OT Treatment/Interventions: Medical illustrator training;Patient/family education;Therapeutic Exercise;Functional mobility training;Self Care/advanced ADL retraining;UE/LE Strength taining/ROM;UE/LE Coordination activities;Neuromuscular re-education;Discharge planning;Disease mangement/prevention;Pain management;Wheelchair propulsion/positioning;Therapeutic Activities;Splinting/orthotics;Skin care/wound managment;Psychosocial support;DME/adaptive equipment instruction;Cognitive remediation/compensation OT Basic Self-Care Anticipated Outcome(s): Min-mod A OT Toileting Anticipated Outcome(s): Min A OT Bathroom Transfers  Anticipated Outcome(s): Min A toilet, mod A shower OT Recommendation Recommendations for Other Services: Therapeutic Recreation consult Therapeutic Recreation Interventions: Stress management Patient destination: Home Follow Up Recommendations: Home health OT Equipment Recommended: None recommended by OT Equipment Details: issued LH sponge, has splint and all needed DME and power and manual w/c in place   OT Evaluation Precautions/Restrictions  Precautions Precautions: Fall Required Braces or Orthoses: Splint/Cast Splint/Cast: L resting hand splint Restrictions Weight Bearing Restrictions: No General Chart Reviewed: Yes Family/Caregiver Present: No Vital Signs   Pain Pain Assessment Pain Scale: 0-10 Pain Score: 10-Worst pain ever Pain Type: Chronic pain Pain Location: Generalized Pain Descriptors / Indicators: Aching;Burning Pain Frequency: Constant Pain Onset: On-going Patients Stated Pain Goal: 5 Pain Intervention(s): Pain med given for lower pain score than stated, per patient request;Repositioned;Emotional support;Rest;Relaxation;Elevated extremity Multiple Pain Sites: No Home Living/Prior Functioning Home Living Family/patient expects to be discharged to:: Private residence Living Arrangements: Spouse/significant other Available Help at Discharge: Family, Available PRN/intermittently Type of Home: House Home Access: Other (comment), Elevator Home Layout: Two level, Able to live on main level with bedroom/bathroom Alternate Level Stairs-Number of Steps: 16-18 Bathroom Shower/Tub: Multimedia programmer: Handicapped height Bathroom Accessibility: Yes Additional Comments: was getting HHPT prior to inital admssion  Lives With: Spouse IADL History Homemaking Responsibilities: Yes Meal Prep Responsibility: Secondary Occupation: Unemployed Leisure and Hobbies: love crafting, cooking, and shopping Prior Function Level of Independence: Needs assistance with  tranfers, Needs assistance with ADLs Bath: Minimal Toileting: Minimal  Able to Take Stairs?: No Driving: No Vision Baseline Vision/History: 1 Wears glasses Ability to See in Adequate Light: 0 Adequate Patient Visual Report: No change from baseline Vision Assessment?: No apparent visual deficits Perception  Perception: Within Functional Limits Praxis Praxis: Intact Cognition Cognition Overall Cognitive Status: History of cognitive impairments - at baseline Arousal/Alertness: Awake/alert Memory: Impaired Memory Impairment: Decreased recall of  new information Awareness: Appears intact Problem Solving: Appears intact Safety/Judgment: Appears intact Brief Interview for Mental Status (BIMS) Repetition of Three Words (First Attempt): 3 Temporal Orientation: Year: Correct Temporal Orientation: Month: Accurate within 5 days Temporal Orientation: Day: Correct Recall: "Sock": Yes, no cue required Recall: "Blue": Yes, after cueing ("a color") Recall: "Bed": Yes, after cueing ("a piece of furniture") BIMS Summary Score: 13 Sensation Sensation Light Touch: Impaired by gross assessment Hot/Cold: Appears Intact Proprioception: Appears Intact Stereognosis: Impaired by gross assessment Additional Comments: numbness & pins and needles in bilateral feet and L UE Coordination Gross Motor Movements are Fluid and Coordinated: No Fine Motor Movements are Fluid and Coordinated: No Coordination and Movement Description: grossly uncoordinated due to increased spasticity, weakness and balance deficits Finger Nose Finger Test: R UE intact, L UE slowed, dysmetria, overshoots Heel Shin Test: R LE decreased ROM and delayed, unable to perform L LE 9 Hole Peg Test: TBA as needed Motor  Motor Motor: Abnormal tone Motor - Skilled Clinical Observations: PMH of Secondary Progressive MS with increased spasticity  Trunk/Postural Assessment  Cervical Assessment Cervical Assessment: Exceptions to  [facility] Thoracic Assessment Thoracic Assessment: Exceptions to [facility] Lumbar Assessment Lumbar Assessment: Exceptions to [facility] Postural Control Postural Control: Deficits on evaluation Righting Reactions: decreased Protective Responses: decreased  Balance Balance Balance Assessed: Yes Static Sitting Balance Static Sitting - Balance Support: Feet supported Static Sitting - Level of Assistance: 3: Mod assist Dynamic Sitting Balance Dynamic Sitting - Balance Support: Feet supported Dynamic Sitting - Level of Assistance: 3: Mod assist Dynamic Sitting - Balance Activities: Lateral lean/weight shifting;Forward lean/weight shifting Sitting balance - Comments: minguard EOB and at [facility] with and without UE support Static Standing Balance Static Standing - Balance Support: During functional activity Static Standing - Level of Assistance: 2: Max assist Extremity/Trunk Assessment RUE Assessment RUE Assessment: Exceptions to [facility] General Strength Comments: generalized weakness RUE Tone RUE Tone: Modified [place] Body Part - Modified [place] Scale: Elbow;Wrist;Fingers;Thumb Elbow - Modified [place] Scale for Grading Hypertonia RUE: No increase in muscle tone Wrist - Modified [place] Scale for Grading Hypertonia RUE: No increase in muscle tone Fingers - Modified [place] Scale for Grading Hypertonia RUE: No increase in muscle tone Thumb - Modified [place] Scale for Grading Hypertonia RUE: No increase in muscle tone Modified [place] Scale for Grading Hypertonia RUE: No increase in muscle tone LUE Assessment LUE Assessment: Exceptions to [facility] General Strength Comments: 2/5 LUE Tone LUE Tone: Modified [place] Body Part - Modified [place] Scale: Elbow;Wrist;Fingers;Thumb Elbow - Modified [place] Scale for Grading Hypertonia LUE: Slight increase in muscle tone, manifested by a catch, followed by minimal resistance throughout the remainder (less than half) of the ROM Wrist - Modified [place] Scale  for Grading Hypertonia LUE: No increase in muscle tone Fingers - Modified [place] Scale for Grading Hypertonia LUE: No increase in muscle tone Thumb - Modified [place] Scale for Grading Hypertonia LUE: No increase in muscle tone Modified [place] Scale for Grading Hypertonia LUE: Slight increase in muscle tone, manifested by a catch, followed by minimal resistance throughout the remainder (less than half) of the ROM  Care Tool Care Tool Self Care Eating   Eating Assist Level: Set up assist    Oral Care    Oral Care Assist Level: Set up assist    Bathing   Body parts bathed by patient: Left arm;Chest;Abdomen;Front perineal area;Buttocks;Right upper leg;Left upper leg;Face;Right arm Body parts bathed by helper: Right lower leg;Left lower leg   Assist Level: Moderate Assistance - Patient 50 -  74%    Upper Body Dressing(including orthotics)   What is the patient wearing?: Pull over shirt;Bra   Assist Level: Minimal Assistance - Patient > 75%    Lower Body Dressing (excluding footwear)   What is the patient wearing?: Pants;Underwear/pull up Assist for lower body dressing: Maximal Assistance - Patient 25 - 49%    Putting on/Taking off footwear   What is the patient wearing?: Non-skid slipper socks Assist for footwear: Maximal Assistance - Patient 25 - 49%       Care Tool Toileting Toileting activity Toileting Activity did not occur (Clothing management and hygiene only): N/A (no void or bm)       Care Tool Bed Mobility Roll left and right activity    See Flowsheets     Sit to lying activity        Lying to sitting on side of bed activity         Care Tool Transfers Sit to stand transfer        Chair/bed transfer         Toilet transfer Toilet transfer activity did not occur: Safety/medical concerns       Care Tool Cognition  Expression of Ideas and Wants  See Flowsheets   Understanding Verbal and Non-Verbal Content     Memory/Recall Ability     Refer to  Care Plan for Long Term Goals  SHORT TERM GOAL WEEK 1 STG=LTGs 2/2 LOS   Recommendations for other services: Therapeutic Recreation  Stress management   Skilled Therapeutic Intervention ADL ADL Equipment Provided: Long-handled sponge Eating: Set up Where Assessed-Eating: Wheelchair;Bed level Grooming: Setup Where Assessed-Grooming: Sitting at sink Upper Body Bathing: Minimal assistance Where Assessed-Upper Body Bathing: Sitting at sink Lower Body Bathing: Moderate assistance Where Assessed-Lower Body Bathing: Sitting at sink;Standing at sink Upper Body Dressing: Minimal assistance Where Assessed-Upper Body Dressing: Wheelchair Lower Body Dressing: Moderate assistance Where Assessed-Lower Body Dressing: Wheelchair Toileting: Unable to assess Toilet Transfer: Unable to assess Social research officer, government: Unable to assess ADL Comments: Pt completed self care routine power w/c level sink side with assisted standing for LB bathing and dressing, uses Foley for voiding and OT will assess toileting next visit as pt did not require this session. Mobility  Bed Mobility Bed Mobility: Rolling Left;Supine to Sit;Sit to Supine Rolling Right: Moderate Assistance - Patient 50-74% Supine to Sit: Moderate Assistance - Patient 50-74% Sit to Supine: Moderate Assistance - Patient 50-74% Transfers Sit to Stand: Moderate Assistance - Patient 50-74% Stand to Sit: Moderate Assistance - Patient 50-74%  OT Treatment/Interventions:  Pt seen for full initial OT evaluation and training session this am. Pt known to this clinician from previous CIR stay prior to discharge to acute care. Pt in bed in [place] air mattress upon OT arrival. OT re-introduced role of therapy and purpose of session as pt requires support to remain on task to accomplish tasks due to need for significant functional time. Pt  open to all presented assessment and training this visit. Pt has her won power w/c thus bed level and power w/c  sinkside activity conducted.  Pt to use L resting hand splint at night for positioning. Pt with L wind swept posture and significant weakness L side. OT assisted and assessed ADL's, mobility, vision, sensation. cognition/lang, G/FMC, strength, MAS for tone and balance throughout session. See above for levels. Pt will benefit from skilled OT services at CIR to maximize function and safety with recommendation to return home with min A for toileting and  for BADL's with husband to assist with higher level care as prior level of function with chronic/progressive MS and OT services upon d/c home. Pt left at end of session in power w/c with LE's elevated, Leann NT made aware, no alarm as MD had discontinued previously and tray table and nurse call bell within reach.   Discharge Criteria: Patient will be discharged from OT if patient refuses treatment 3 consecutive times without medical reason, if treatment goals not met, if there is a change in medical status, if patient makes no progress towards goals or if patient is discharged from hospital.  The above assessment, treatment plan, treatment alternatives and goals were discussed and mutually agreed upon: by patient  Barnabas Lister 04/04/2022, 7:42 PM

## 2022-04-05 MED ORDER — TRAZODONE HCL 50 MG PO TABS
50.0000 mg | ORAL_TABLET | Freq: Every day | ORAL | Status: DC
  Administered 2022-04-05: 50 mg via ORAL
  Filled 2022-04-05: qty 1

## 2022-04-05 MED ORDER — POLYETHYLENE GLYCOL 3350 17 G PO PACK
17.0000 g | PACK | Freq: Every day | ORAL | Status: DC
  Administered 2022-04-08 – 2022-05-04 (×26): 17 g via ORAL
  Filled 2022-04-05 (×26): qty 1

## 2022-04-05 MED ORDER — POLYETHYLENE GLYCOL 3350 17 G PO PACK
17.0000 g | PACK | Freq: Two times a day (BID) | ORAL | Status: AC
  Administered 2022-04-07: 17 g via ORAL
  Filled 2022-04-05 (×6): qty 1

## 2022-04-05 NOTE — Progress Notes (Signed)
PROGRESS NOTE   Subjective/Complaints:  Pt reports things basically the same-  Wakes up every 1-2 hours- and hard to sleep like that.     ROS:  Pt denies SOB, abd pain, CP, N/V/C/D, and vision changes  Except for HPI  Objective:   No results found. Recent Labs    04/04/22 0549  WBC 10.8*  HGB 11.0*  HCT 34.7*  PLT 318   Recent Labs    04/03/22 0546 04/04/22 0549  NA  --  142  K  --  4.1  CL  --  106  CO2  --  27  GLUCOSE  --  106*  BUN  --  12  CREATININE 0.71 0.65  CALCIUM  --  9.8    Intake/Output Summary (Last 24 hours) at 04/05/2022 1048 Last data filed at 04/05/2022 1610 Gross per 24 hour  Intake 320 ml  Output 1750 ml  Net -1430 ml     Pressure Injury 03/31/22 Sacrum Medial Deep Tissue Pressure Injury - Purple or maroon localized area of discolored intact skin or blood-filled blister due to damage of underlying soft tissue from pressure and/or shear. (Active)  03/31/22 0815  Location: Sacrum  Location Orientation: Medial  Staging: Deep Tissue Pressure Injury - Purple or maroon localized area of discolored intact skin or blood-filled blister due to damage of underlying soft tissue from pressure and/or shear.  Wound Description (Comments):   Present on Admission: Yes     Pressure Injury 04/03/22 Thigh Left;Posterior;Proximal Stage 2 -  Partial thickness loss of dermis presenting as a shallow open injury with a red, pink wound bed without slough. red pink (Active)  04/03/22 1751  Location: Thigh  Location Orientation: Left;Posterior;Proximal  Staging: Stage 2 -  Partial thickness loss of dermis presenting as a shallow open injury with a red, pink wound bed without slough.  Wound Description (Comments): red pink  Present on Admission: Yes     Pressure Injury 04/03/22 Ankle Left;Posterior Deep Tissue Pressure Injury - Purple or maroon localized area of discolored intact skin or blood-filled  blister due to damage of underlying soft tissue from pressure and/or shear. 1x1 (Active)  04/03/22 1752  Location: Ankle  Location Orientation: Left;Posterior  Staging: Deep Tissue Pressure Injury - Purple or maroon localized area of discolored intact skin or blood-filled blister due to damage of underlying soft tissue from pressure and/or shear.  Wound Description (Comments): 1x1  Present on Admission: Yes    Physical Exam: Vital Signs Blood pressure 106/69, pulse 67, temperature 97.6 F (36.4 C), temperature source Oral, resp. rate 16, height _0  (1.753 m), weight 56.2 kg, SpO2 100 %.     General: awake, alert, appropriate, sitting up in bed; eating chips; NAD HENT: conjugate gaze; oropharynx less dry than normal- but drinking PO CV: regular rate; no JVD Pulmonary: CTA B/L; no W/R/R- good air movement GI: soft, NT, ND, (+)BS- hypoactive- ITB pump on R side Psychiatric: appropriate- brighter affect today Neurological: repeating self sometimes Musculoskeletal:     Cervical back: Neck supple.     Comments: Able to lift head and move LUE and head around more like her baseline RUE very strong- basically 5/5  Difficult to test on RLE due to tone LLE- back to baseline  Skin:    General: Skin is warm and dry.     Comments: New DTI on coccyx  Also worsening skin open/pressure Stage II on posterior thigh- on R thigh Also small DTI on R heel  Neurological:     Mental Status: She is alert.     Comments: MAS of 3 in RLE with significant extensor tone MAS of 1+ in LLE with a few beats clonus on LLE and too tight to trigger clonus on RLE LUE- normal tone,- usually flaccid RUE- normal tone- normally normal    Assessment/Plan: 1. Functional deficits which require 3+ hours per day of interdisciplinary therapy in a comprehensive inpatient rehab setting. Physiatrist is providing close team supervision and 24 hour management of active medical problems listed below. Physiatrist and rehab  team continue to assess barriers to discharge/monitor patient progress toward functional and medical goals  Care Tool:  Bathing    Body parts bathed by patient: Left arm, Chest, Abdomen, Front perineal area, Buttocks, Right upper leg, Left upper leg, Face, Right arm   Body parts bathed by helper: Right lower leg, Left lower leg     Bathing assist Assist Level: Moderate Assistance - Patient 50 - 74%     Upper Body Dressing/Undressing Upper body dressing   What is the patient wearing?: Pull over shirt, Bra    Upper body assist Assist Level: Minimal Assistance - Patient > 75%    Lower Body Dressing/Undressing Lower body dressing      What is the patient wearing?: Pants, Underwear/pull up     Lower body assist Assist for lower body dressing: Maximal Assistance - Patient 25 - 49%     Toileting Toileting Toileting Activity did not occur (Clothing management and hygiene only): N/A (no void or bm)  Toileting assist       Transfers Chair/bed transfer  Transfers assist     Chair/bed transfer assist level: Maximal Assistance - Patient 25 - 49%     Locomotion Ambulation   Ambulation assist   Ambulation activity did not occur: Safety/medical concerns (unable to perform due to pain, weakness and decreased activity tolerance)          Walk 10 feet activity   Assist  Walk 10 feet activity did not occur: Safety/medical concerns (unable to perform due to pain, weakness and decreased activity tolerance)        Walk 50 feet activity   Assist Walk 50 feet with 2 turns activity did not occur: Safety/medical concerns (unable to perform due to pain, weakness and decreased activity tolerance)         Walk 150 feet activity   Assist Walk 150 feet activity did not occur: Safety/medical concerns (unable to perform due to pain, weakness and decreased activity tolerance)         Walk 10 feet on uneven surface  activity   Assist Walk 10 feet on uneven surfaces  activity did not occur: Safety/medical concerns (unable to perform due to pain, weakness and decreased activity tolerance)         Wheelchair     Assist Is the patient using a wheelchair?: Yes Type of Wheelchair: Power    Wheelchair assist level: Supervision/Verbal cueing Max wheelchair distance: 300 ft    Wheelchair 50 feet with 2 turns activity    Assist        Assist Level: Supervision/Verbal cueing   Wheelchair 150 feet activity  Assist      Assist Level: Supervision/Verbal cueing   Blood pressure 106/69, pulse 67, temperature 97.6 F (36.4 C), temperature source Oral, resp. rate 16, height _0  (1.753 m), weight 56.2 kg, SpO2 100 %.  Medical Problem List and Plan: 1. Functional deficits due to secondary progressive multiple sclerosis and debility from prolonged admission on acute floor for encephalopathy             -patient may  shower             -ELOS/Goals: min A to supervision- 7-10 days  Con't CIR- PT, OT- SLP 2.  Antithrombotics: -DVT/anticoagulation:  Pharmaceutical: Lovenox             -antiplatelet therapy: none 3. Pain Management:              -Naproxen 500 mg twice daily             -Tylenol as needed             -Oxycodone 10 mg every 4 hours as needed- will change to Nucynta 50 mg q4 hours - will not add Methadone due to chance of polypharmacy that might have occurred last week in addition to Cefepime encephalopathy.   11/8- explained to pt changed to Nucynta yesterday evening- it takes a few days for nerve pain meds to kick in- and cannot change doses today- will increase ITB pump tomorrow  11/9- increasing ITB pump today 4. Mood/Behavior/Sleep: LCSW to evaluate and provide emotional support             -antipsychotic agents: n/a             -Wellbutrin XL 300 mg daily             -amantadine 100 mg TID- per Dr Felecia Shelling 5. Neuropsych/cognition: This patient is capable of making decisions on her own behalf. 6. Skin/Wound Care: Routine  skin care checks  11/7- has new DTI on coccyx, worsening now stage II on posterior R thigh and small DTI R heel- added prevalon boots in bed; pt will NOT use air mattress- changing to regular bed due to pt insistence; and foam dressing and turn q2 hours.   11/9- pt doesn't like prevalon boots, but educated on the importance 7. Fluids/Electrolytes/Nutrition: Routine Is and Os and follow-up chemistries             -continue Vitamin C, D3, B12 and fiber supplements 8: MS with spasticity: continue dalfampridine BID and [place]              -Baclofen 20 mg twice daily, 40 mg every morning             -Valium 5 mg every 8 hours             -Zanaflex 4 mg twice daily- were reduced due to cognition/sedation on acute- will change out /refill pump Thursday as well as increase dosing of ITB pump  11/8- spasticity slightly more than normal, however increasing ITB pump tomorrow, so will not increase spasticity PO meds  11/9- increasing ITB pump today- and refilling it.  9: GERD: restart Zantac 10: Neurogenic bladder with indwelling Foley; s/p UTI treatment with Zosyn 11: Bowel program: Nutrisource fiber, psyllium caps, Colace, MiraLax, Senekot-S given daily             -placed PRN sorbitol and Fleets orders- has gotten really constipated/needing multiple enemas-   11/9- will increase miralax to BID x 3 days since increasing  pump- no BM since admission to rehab 11/7 12: Dysphagia: ? resolved; now on regular diet; SLP eval 13: Seasonal allergies off home meds: monitor 14: Tooth abscess: missed follow-up appointment; completed Flagyl 15: Hypokalemia: on K+ supplement QOD; follow-up BMP tomorrow- likely due to previous Lasix dose for Edema.    11/8- K+ 4.1 16. Insomnia  11/9- will add Trazodone 50 mg QHS and can titrate up as required  I spent a total of  57  minutes on total care today- >50% coordination of care- due to filling and titration of  ITB pump.     Baclofen Pump Refill. (Synchromed II)     After informed consent and interrogation of the patient's baclofen pump to confirm residual volume, rate, concentration, etc, his abdomen was prepped with betadine. Baclofen kit was then opened at beside. Sterile technique was utilized to dawn gloves and drape the area. Using the plastic guide, I localized the fill port and inserted a needle with clamped line into the pump. Next, the remaining baclofen was removed and the volume was compatible with interrogation device  (was supposed to be 10 ml- was 9 ml) (51m). Thereafter, baclofen was drawn into the syringe from the supplied vial. A filter was applied to syringe and excess air was expelled through the filter.. I then attached the syringe/filter to the clamped line .  Then the line was unclamped and with slow, gradual pressure I replaced  40cc of intrathecal baclofen- 2000 mcg/ml-  into the pump. The line was re-clamped and I removed the needle from the abdomen.  The area was then cleaned and dressing was applied. .  After completing the fill, the pump was reinterrogated to confirm concentration/volume/rate. We decided to increase her rate `0% to 540.2 mcg mcg/day. Her alarm date is now 08/23/22.  Pt will follow up here the week prior to his alarm date.     LOS: 2 days A FACE TO FACE EVALUATION WAS PERFORMED  Nihira Puello 04/05/2022, 10:48 AM

## 2022-04-05 NOTE — Progress Notes (Signed)
MD administered x 2 doses 40mg /52ml of Baclofen to patient. RN unable to scan med as vials discarded.

## 2022-04-05 NOTE — Progress Notes (Signed)
Occupational Therapy Session Note  Patient Details  Name: Sharon Odom MRN: [id number] Date of Birth: Jul 07, 1970  Today's Date: 04/05/2022 OT Individual Time: 1000-1102 OT Individual Time Calculation (min): 62 min    Short Term Goals: Week 1:  OT Short Term Goal 1 (Week 1): STG=LTG d/t ELOS  Skilled Therapeutic Interventions/Progress Updates:   Pt seen for am self care retraining visit with focus on toileting progression and bathing and hygiene shower level. Pt already up in power w/c therefore MAS dine in recline. See below. Pt able to set self up in power w/c in front of commode placed in stall shower. Transfer via modified SPT with grab bar with mod a. Pt with difficulty maintaining midline uprit posture without UE support therefore OT managed HH hose while pt bathed UB and LB with increased time and effort and occ min a. Peri hygiene and buttocks with mod A and pt had large BM in shower. See Flowsheets for data. Mod A for Depends with Foley threading and Rw for standing but then pt only set up with increased time for pull over cami and dress. Pt set up sink side for hair drying and completion of grooming. NT alerted of pt's status and call button and needs all within reach.    Therapy Documentation Precautions:    04/05/22 1500  RUE Tone  RUE Tone Modified Ashworth  Body Part - Modified Ashworth Scale Elbow;Wrist;Fingers;Thumb  Elbow - Modified [place] Scale for Grading Hypertonia RUE 0  Wrist - Modified [place] Scale for Grading Hypertonia RUE 0  Fingers - Modified [place] Scale for Grading Hypertonia RUE 0  Thumb - Modified [place] Scale for Grading Hypertonia RUE 0  Modified [place] Scale for Grading Hypertonia RUE 0    04/05/22 1500  L UE Tone  L UE Tone Modified [place]worth  Body Part - Modified [place]worth Scale Elbow;Wrist;Fingers;Thumb  Elbow - Modified [place] Scale for Grading Hypertonia RUE 1  Wrist - Modified [place] Scale for Grading Hypertonia RUE 0  Fingers -  Modified [place] Scale for Grading Hypertonia RUE 0  Thumb - Modified [place]worth Scale for Grading Hypertonia RUE 0  Modified Ashworth Scale for Grading Hypertonia RUE 1     Therapy/Group: Individual Therapy  Sharon Odom 04/05/2022, 7:47 AM

## 2022-04-05 NOTE — Progress Notes (Signed)
Physical Therapy Session Note  Patient Details  Name: Sharon Odom MRN: [id number] Date of Birth: Oct 06, 1970  Today's Date: 04/05/2022 PT Individual Time: 0805-0901 PT Individual Time Calculation (min): 56 min   Short Term Goals: Week 1:  PT Short Term Goal 1 (Week 1): STG=LTG due ELOS  Skilled Therapeutic Interventions/Progress Updates:      Therapy Documentation Precautions:  Precautions Precautions: Fall Required Braces or Orthoses: Splint/Cast Splint/Cast: L resting hand splint Restrictions Weight Bearing Restrictions: No   Pt received semi-reclined in bed and agreeable to PT session. Pt reports 8/10 pain to L UE and bilateral LE's and was pre-medicated. Pt requires min A with supine to sit with bed features (HOB elevated and leg lift). Pt requests to get dressed and requires mod A for dynamic sitting balance with upper body dressing. Pt requires min/mod A for dressing for time management and energy conservation. Pt requested to toilet for BM. Pt requires mod A with squat pivot to drop arm bedside commode and pt with  residue but no +BM. Pt requires max A for sit to stand from [facility] and is able to pull up briefs with mod A in standing position. Pt requires max A with squat pivot transfer from [facility] to PWC. Pt performed self-care tasks and grooming at sink with min A for posterior loss of balance in w/c. Pt left seated in w/c at bedside with all needs in reach.    04/05/22 1213  LLE Tone  LLE Tone Modified Ashworth  Body Part - Modified Ashworth Scale Hamstrings;Gastrocnemius  Hamstrings - Modified [place]worth Scale for Grading Hypertonia LLE 0  Gastrocnemius - Modified Ashworth Scale for Grading Hypertonia LLE 0     04/05/22 1212  RLE Tone  RLE Tone Modified Ashworth  Body Part - Modified [place]worth Scale Hamstrings;Gastrocnemius  Hamstrings - Modified [place]worth Scale for Grading Hypertonia RLE 0  GASTROCNEMIUS - Modified [place]worth Scale for Grading Hypertonia RLE 1      Therapy/Group: Individual Therapy  [name] [name] PT, DPT  04/05/2022, 7:45 AM

## 2022-04-05 NOTE — Progress Notes (Signed)
Physical Therapy Session Note  Patient Details  Name: Sharon Odom MRN: [id number] Date of Birth: [birth year]  Today's Date: 04/05/2022 PT Individual Time: 1345-1458 PT Individual Time Calculation (min): 73 min   Short Term Goals: Week 1:  PT Short Term Goal 1 (Week 1): STG=LTG due ELOS  Skilled Therapeutic Interventions/Progress Updates:    Pt seated in w/c on arrival and agreeable to therapy. Nsg present for meds pass. Pt reports BLE leg and LUE, 9/10, premedicated. Rest and positioning provided as needed. Pt stood x 2 with mod-max A to adjust clothing. Pt utilized PWC features for positioning mod I and navigating throughout session mod I. Scooting back in w/c with min A  Pt performed Sit to stand 3 x 4 with as little as CGA but as much as mod A with fatigue with emphasis on upright posture. Extended rest breaks d/t fatigue. Pt became tearful about her situation. Therapist provided encouragement and psychosocial support. Pt then performed 1 step fwd/ 1 step back with max A for knee block and L hip extension with seated rest breaks, x 3. Pt returned to room and remained in chair, was left with all needs in reach.   Therapy Documentation Precautions:  Precautions Precautions: Fall Required Braces or Orthoses: Splint/Cast Splint/Cast: L resting hand splint Restrictions Weight Bearing Restrictions: No General:      Therapy/Group: Individual Therapy  Juluis Rainier 04/05/2022, 2:24 PM

## 2022-04-06 MED ORDER — SENNOSIDES-DOCUSATE SODIUM 8.6-50 MG PO TABS
2.0000 | ORAL_TABLET | Freq: Every day | ORAL | Status: DC
  Administered 2022-04-06 – 2022-05-04 (×28): 2 via ORAL
  Filled 2022-04-06 (×28): qty 2

## 2022-04-06 MED ORDER — TRAZODONE HCL 50 MG PO TABS
150.0000 mg | ORAL_TABLET | Freq: Every day | ORAL | Status: DC
  Administered 2022-04-06 – 2022-04-09 (×4): 150 mg via ORAL
  Filled 2022-04-06 (×4): qty 3

## 2022-04-06 NOTE — Progress Notes (Signed)
Occupational Therapy Session Note  Patient Details  Name: Sharon Odom MRN: [id number] Date of Birth: [birth year]  Today's Date: 04/06/2022 OT Individual Time: 1300-1420 OT Individual Time Calculation (min): 80 min    Short Term Goals: Week 1:  OT Short Term Goal 1 (Week 1): STG=LTG d/t ELOS  Skilled Therapeutic Interventions/Progress Updates:    Upon OT arrival, pt finishing her lunch. Pt agreeable to OT treatment session. Treatment intervention with a focus on self care retraining, w/c mobility, dynamic sitting balance, reaching, UE strengthening. Pt feeds herself independently and repositions herself in [place] with increased time and effort. Pt maneuvers in her room with Supervision and requires verbal cues to correct her PWC as she was stuck initially to recliner located behind her after raising her seat too low. Pt transports herself infront of sink and retrieves mirror with SBA. Pt drops mirror and therapist held mirror for pt to perform grooming with Min A. Pt transports herself to elevator with Supervision and is able to use map to locate the gift shop and point to it on map with her L UE. Pt manages elevator buttons with dynamic sitting balance incorporated. Pt transports herself into the gift shop and reaches for various items including scarves, sweaters, booties, using primarily the L UE with occasional assist from the R UE. Pt purchases 2 cardigans with SBA and is able to manage the credit card machine and sign her name. Pt transports herself back to her room via TIS and Supervision and was positioned with pillows and all needs met.   Therapy Documentation Precautions:  Precautions Precautions: Fall Required Braces or Orthoses: Splint/Cast Splint/Cast: L resting hand splint Restrictions Weight Bearing Restrictions: No    Therapy/Group: Individual Therapy  Marvetta Gibbons 04/06/2022, 4:45 PM

## 2022-04-06 NOTE — Progress Notes (Signed)
Occupational Therapy Session Note  Patient Details  Name: Sharon Odom MRN: [id number] Date of Birth: 1971-05-26  Today's Date: 04/06/2022 OT Individual Time: 1000-1100 OT Individual Time Calculation (min): 60 min    Short Term Goals: Week 1:  OT Short Term Goal 1 (Week 1): STG=LTG d/t ELOS  Skilled Therapeutic Interventions/Progress Updates:   Pt seen for am self care retraining visit with focus on self care, especially peri hygiene and lower body garment management.  Pt in bed therefore MAS testing done in supine. Moved to EOB with increased time and effort but S with leg lifter and bed rails. Transfer via modified SPT with Rw to power w/c with min-mod a. Pt with improved midline sitting and sit to stands this session. Peri hygiene and buttocks with min A with forward lean sink side. Mod-min A for underwear with poise pad with Foley threading and Rw for standing but then pt only set up with sweatshirt overhead using hemi technique. Pt set up sink side for the rest of her grooming routine. NT alerted of pt's status and call button and needs all within reach.      Therapy Documentation Precautions:      04/06/2022  RUE Tone  RUE Tone Modified Ashworth  Body Part - Modified [place]worth Scale Elbow;Wrist;Fingers;Thumb  Elbow - Modified [place] Scale for Grading Hypertonia RUE 0  Wrist - Modified [place] Scale for Grading Hypertonia RUE 0  Fingers - Modified [place] Scale for Grading Hypertonia RUE 0  Thumb - Modified [place]worth Scale for Grading Hypertonia RUE 0  Modified [place]worth Scale for Grading Hypertonia RUE 0      04/06/2022  L UE Tone  L UE Tone Modified Ashworth  Body Part - Modified [place]worth Scale Elbow;Wrist;Fingers;Thumb  Elbow - Modified [place] Scale for Grading Hypertonia RUE 1  Wrist - Modified [place] Scale for Grading Hypertonia RUE 0  Fingers - Modified [place] Scale for Grading Hypertonia RUE 0  Thumb - Modified [place] Scale for Grading Hypertonia RUE 0   Modified [place] Scale for Grading Hypertonia RUE 1    Therapy Documentation Precautions:  Precautions Precautions: Fall Required Braces or Orthoses: Splint/Cast Splint/Cast: L resting hand splint Restrictions Weight Bearing Restrictions: No   Therapy/Group: Individual Therapy  Vicenta Dunning 04/06/2022, 7:48 AM

## 2022-04-06 NOTE — Progress Notes (Signed)
PROGRESS NOTE   Subjective/Complaints:  Didn't sleep well- maybe 1 hour more, but trazodone not real helpful at 50 mg QHS.  Also having sweating like menopause and but then cold.  Also had BM in shower yesterday.    ROS:  Pt denies SOB, abd pain, CP, N/V/C/D, and vision changes  Except for HPI  Objective:   No results found. Recent Labs    04/04/22 0549  WBC 10.8*  HGB 11.0*  HCT 34.7*  PLT 318   Recent Labs    04/04/22 0549  NA 142  K 4.1  CL 106  CO2 27  GLUCOSE 106*  BUN 12  CREATININE 0.65  CALCIUM 9.8    Intake/Output Summary (Last 24 hours) at 04/06/2022 1309 Last data filed at 04/06/2022 6384 Gross per 24 hour  Intake 238 ml  Output 1300 ml  Net -1062 ml     Pressure Injury 03/31/22 Sacrum Medial Deep Tissue Pressure Injury - Purple or maroon localized area of discolored intact skin or blood-filled blister due to damage of underlying soft tissue from pressure and/or shear. (Active)  03/31/22 0815  Location: Sacrum  Location Orientation: Medial  Staging: Deep Tissue Pressure Injury - Purple or maroon localized area of discolored intact skin or blood-filled blister due to damage of underlying soft tissue from pressure and/or shear.  Wound Description (Comments):   Present on Admission: Yes     Pressure Injury 04/03/22 Thigh Left;Posterior;Proximal Stage 2 -  Partial thickness loss of dermis presenting as a shallow open injury with a red, pink wound bed without slough. red pink (Active)  04/03/22 1751  Location: Thigh  Location Orientation: Left;Posterior;Proximal  Staging: Stage 2 -  Partial thickness loss of dermis presenting as a shallow open injury with a red, pink wound bed without slough.  Wound Description (Comments): red pink  Present on Admission: Yes     Pressure Injury 04/03/22 Ankle Left;Posterior Deep Tissue Pressure Injury - Purple or maroon localized area of discolored intact  skin or blood-filled blister due to damage of underlying soft tissue from pressure and/or shear. 1x1 (Active)  04/03/22 1752  Location: Ankle  Location Orientation: Left;Posterior  Staging: Deep Tissue Pressure Injury - Purple or maroon localized area of discolored intact skin or blood-filled blister due to damage of underlying soft tissue from pressure and/or shear.  Wound Description (Comments): 1x1  Present on Admission: Yes    Physical Exam: Vital Signs Blood pressure 114/71, pulse 65, temperature 97.6 F (36.4 C), resp. rate 18, height _0  (1.753 m), weight 56.2 kg, SpO2 100 %.      General: awake, alert, appropriate, in bed supine- c/o poor sleep; NAD HENT: conjugate gaze; oropharynx moist CV: regular rate; no JVD Pulmonary: CTA B/L; no W/R/R- good air movement GI: soft, NT, ND, (+)BS- ITB pump in place on R Psychiatric: appropriate- talkative, then tearful intermittently.  Neurological: repeats self sometimes Musculoskeletal:     Cervical back: Neck supple.     Comments: Able to lift head and move LUE and head around more like her baseline RUE very strong- basically 5/5 Difficult to test on RLE due to tone LLE- back to baseline  Skin:  General: Skin is warm and dry.     Comments: New DTI on coccyx  Also worsening skin open/pressure Stage II on posterior thigh- on R thigh Also small DTI on R heel  Neurological:     Mental Status: She is alert.     Comments: MAS of 3 in RLE with significant extensor tone MAS of 1+ in LLE with a few beats clonus on LLE and too tight to trigger clonus on RLE LUE- normal tone,- usually flaccid RUE- normal tone- normally normal    Assessment/Plan: 1. Functional deficits which require 3+ hours per day of interdisciplinary therapy in a comprehensive inpatient rehab setting. Physiatrist is providing close team supervision and 24 hour management of active medical problems listed below. Physiatrist and rehab team continue to assess  barriers to discharge/monitor patient progress toward functional and medical goals  Care Tool:  Bathing    Body parts bathed by patient: Left arm, Chest, Abdomen, Front perineal area, Buttocks, Right upper leg, Left upper leg, Face, Right arm   Body parts bathed by helper: Right lower leg, Left lower leg     Bathing assist Assist Level: Moderate Assistance - Patient 50 - 74%     Upper Body Dressing/Undressing Upper body dressing   What is the patient wearing?: Pull over shirt, Bra    Upper body assist Assist Level: Minimal Assistance - Patient > 75%    Lower Body Dressing/Undressing Lower body dressing      What is the patient wearing?: Pants, Underwear/pull up     Lower body assist Assist for lower body dressing: Maximal Assistance - Patient 25 - 49%     Toileting Toileting Toileting Activity did not occur (Clothing management and hygiene only): N/A (no void or bm)  Toileting assist Assist for toileting: Moderate Assistance - Patient 50 - 74%     Transfers Chair/bed transfer  Transfers assist     Chair/bed transfer assist level: Maximal Assistance - Patient 25 - 49%     Locomotion Ambulation   Ambulation assist   Ambulation activity did not occur: Safety/medical concerns (unable to perform due to pain, weakness and decreased activity tolerance)          Walk 10 feet activity   Assist  Walk 10 feet activity did not occur: Safety/medical concerns (unable to perform due to pain, weakness and decreased activity tolerance)        Walk 50 feet activity   Assist Walk 50 feet with 2 turns activity did not occur: Safety/medical concerns (unable to perform due to pain, weakness and decreased activity tolerance)         Walk 150 feet activity   Assist Walk 150 feet activity did not occur: Safety/medical concerns (unable to perform due to pain, weakness and decreased activity tolerance)         Walk 10 feet on uneven surface   activity   Assist Walk 10 feet on uneven surfaces activity did not occur: Safety/medical concerns (unable to perform due to pain, weakness and decreased activity tolerance)         Wheelchair     Assist Is the patient using a wheelchair?: Yes Type of Wheelchair: Power    Wheelchair assist level: Supervision/Verbal cueing Max wheelchair distance: 300 ft    Wheelchair 50 feet with 2 turns activity    Assist        Assist Level: Supervision/Verbal cueing   Wheelchair 150 feet activity     Assist      Assist  Level: Supervision/Verbal cueing   Blood pressure 114/71, pulse 65, temperature 97.6 F (36.4 C), resp. rate 18, height _0  (1.753 m), weight 56.2 kg, SpO2 100 %.  Medical Problem List and Plan: 1. Functional deficits due to secondary progressive multiple sclerosis and debility from prolonged admission on acute floor for encephalopathy             -patient may  shower             -ELOS/Goals: min A to supervision- 7-10 days  Con't CIR- PT, OT and SLP- IPOC today 2.  Antithrombotics: -DVT/anticoagulation:  Pharmaceutical: Lovenox             -antiplatelet therapy: none 3. Pain Management:              -Naproxen 500 mg twice daily             -Tylenol as needed             -Oxycodone 10 mg every 4 hours as needed- will change to Nucynta 50 mg q4 hours - will not add Methadone due to chance of polypharmacy that might have occurred last week in addition to Cefepime encephalopathy.   11/8- explained to pt changed to Nucynta yesterday evening- it takes a few days for nerve pain meds to kick in- and cannot change doses today- will increase ITB pump tomorrow  11/9- increasing ITB pump today  11/10- up to 540 mcg/day 4. Mood/Behavior/Sleep: LCSW to evaluate and provide emotional support             -antipsychotic agents: n/a             -Wellbutrin XL 300 mg daily             -amantadine 100 mg TID- per Dr Felecia Shelling 5. Neuropsych/cognition: This patient is  capable of making decisions on her own behalf. 6. Skin/Wound Care: Routine skin care checks  11/7- has new DTI on coccyx, worsening now stage II on posterior R thigh and small DTI R heel- added prevalon boots in bed; pt will NOT use air mattress- changing to regular bed due to pt insistence; and foam dressing and turn q2 hours.   11/9- pt doesn't like prevalon boots, but educated on the importance 7. Fluids/Electrolytes/Nutrition: Routine Is and Os and follow-up chemistries             -continue Vitamin C, D3, B12 and fiber supplements 8: MS with spasticity: continue dalfampridine BID and [place]              -Baclofen 20 mg twice daily, 40 mg every morning             -Valium 5 mg every 8 hours             -Zanaflex 4 mg twice daily- were reduced due to cognition/sedation on acute- will change out /refill pump Thursday as well as increase dosing of ITB pump  11/8- spasticity slightly more than normal, however increasing ITB pump tomorrow, so will not increase spasticity PO meds  11/9- increasing ITB pump today- and refilling it.   11/10- pump up to 540 mcg/day 9: GERD: restart Zantac 10: Neurogenic bladder with indwelling Foley; s/p UTI treatment with Zosyn 11: Bowel program: Nutrisource fiber, psyllium caps, Colace, MiraLax, Senekot-S given daily             -placed PRN sorbitol and Fleets orders- has gotten really constipated/needing multiple enemas-   11/9- will increase miralax to  BID x 3 days since increasing pump- no BM since admission to rehab 11/7 12: Dysphagia: ? resolved; now on regular diet; SLP eval 13: Seasonal allergies off home meds: monitor 14: Tooth abscess: missed follow-up appointment; completed Flagyl 15: Hypokalemia: on K+ supplement QOD; follow-up BMP tomorrow- likely due to previous Lasix dose for Edema.    11/8- K+ 4.1 16. Insomnia  11/9- will add Trazodone 50 mg QHS and can titrate up as required  11/10- increase to 150 mg QHS   I spent a total of 35   minutes  on total care today- >50% coordination of care- due to  IPOC and d/w PA about weekend and consoling pt   04/05/22 Baclofen Pump Refill. (Synchromed II)    After informed consent and interrogation of the patient's baclofen pump to confirm residual volume, rate, concentration, etc, his abdomen was prepped with betadine. Baclofen kit was then opened at beside. Sterile technique was utilized to dawn gloves and drape the area. Using the plastic guide, I localized the fill port and inserted a needle with clamped line into the pump. Next, the remaining baclofen was removed and the volume was compatible with interrogation device  (was supposed to be 10 ml- was 9 ml) (82m). Thereafter, baclofen was drawn into the syringe from the supplied vial. A filter was applied to syringe and excess air was expelled through the filter.. I then attached the syringe/filter to the clamped line .  Then the line was unclamped and with slow, gradual pressure I replaced  40cc of intrathecal baclofen- 2000 mcg/ml-  into the pump. The line was re-clamped and I removed the needle from the abdomen.  The area was then cleaned and dressing was applied. .  After completing the fill, the pump was reinterrogated to confirm concentration/volume/rate. We decided to increase her rate `0% to 540.2 mcg mcg/day. Her alarm date is now 08/23/22.  Pt will follow up here the week prior to his alarm date.     LOS: 3 days A FACE TO FACE EVALUATION WAS PERFORMED  Codylee Patil 04/06/2022, 1:09 PM

## 2022-04-06 NOTE — Progress Notes (Signed)
Physical Therapy Session Note  Patient Details  Name: Sharon Odom MRN: [id number] Date of Birth: 1970-12-22  Today's Date: 04/06/2022 PT Individual Time: 1115-1200, 7619-5093 PT Individual Time Calculation (min): 45 min, 34 min   Short Term Goals: Week 1:  PT Short Term Goal 1 (Week 1): STG=LTG due ELOS  Skilled Therapeutic Interventions/Progress Updates:    Session 1: Pt received in PWC, agreeable to therapy. Pt reports 8/10 pain in BLE and LUE. Premedicated, but pt reports "that pain medicine isn't doing diddly squat." Rest and positioning provided as needed. Pt drove PWC with supervision throughout session. Pt performed Stand pivot transfer <> mat table x 2 with RW and max A. Pt requires tot A to maintain LLE hip/knee extension and tot A to complete transfer safely d/t having difficulty maintaining upright posture long enough to complete transfer once first step is taken. Pt then performed Sit to stand x 4 with emphasis on LLE activation and upright posture. Pt returned to room and remained in [facility] with all needs in reach.   MAS: RLE: HS-1 Gastroc-1  LLE HS-0 Gastroc-0  Session 2: Pt received in PWC, agreeable to therapy. No verbal c/o pain this session. Pt opts to transfer to bed this session. Pt maneuvered w/c into position. Sit to stand to RW with min A. Mod-max for Stand pivot transfer for RW management, knee extension, cueing for technique. Once seated EOB, pt unable to maintain sitting balance without RW for UE support or mod A. Extended time for attempt to use leg lifter for sit>supine, but eventually required mod A for LE management and trunk placement. Pt scooted to [facility] with assist only to manipulate bed features. Doffed pants with max A to remove pull off hips in bridge. Tot A to don leg warmers. Extended time to position pt comfortably in bed as pt is very particular about placement. Pt remained in bed and was left with all needs in reach and alarm active.   Therapy  Documentation Precautions:  Precautions Precautions: Fall Required Braces or Orthoses: Splint/Cast Splint/Cast: L resting hand splint Restrictions Weight Bearing Restrictions: No General:       Therapy/Group: Individual Therapy  Juluis Rainier 04/06/2022, 11:35 AM

## 2022-04-06 NOTE — Progress Notes (Signed)
Met with patient. No new complaints. Doesn't remember some staff that has assisted her in past. Informed again of team conference on each Tuesday.  Is concerned about the number of days she can stay.  All other needs met, call bell in reach.

## 2022-04-06 NOTE — IPOC Note (Signed)
Overall Plan of Care [facility]) Patient Details Name: Sharon Odom MRN: [id number] DOB: [birth year]  Admitting Diagnosis: Multiple sclerosis [facility])  Hospital Problems: Principal Problem:   Multiple sclerosis (HCC) Active Problems:   Spasticity   Pressure injury of skin     Functional Problem List: Nursing Bladder, Bowel, Endurance, Medication Management, Pain, Safety, Skin Integrity  PT Balance, Pain, Edema, Safety, Endurance, Sensory, Motor, Skin Integrity, Nutrition  OT Balance, Endurance, Motor, Cognition, Sensory, Pain, Skin Integrity  SLP Cognition  TR         Basic ADL's: OT Grooming, Bathing, Dressing, Toileting     Advanced  ADL's: OT Simple Meal Preparation     Transfers: PT Bed to Chair  OT Toilet, Tub/Shower     Locomotion: PT Wheelchair Mobility     Additional Impairments: OT Fuctional Use of Upper Extremity  SLP Social Cognition   Memory  TR      Anticipated Outcomes Item Anticipated Outcome  Self Feeding    Swallowing      Basic self-care  Min-mod A  Toileting  Min A   Bathroom Transfers Min A toilet, mod A shower  Bowel/Bladder  Neurogenic B+B; foley and incontinent of bowel, bowel program w mod I assist  Transfers  Min A  Locomotion  N/A  Communication     Cognition  Supervision A  Pain  < 4 with prns  Safety/Judgment  maintain safety w cues   Therapy Plan: PT Intensity: Minimum of 1-2 x/day ,45 to 90 minutes PT Frequency: 5 out of 7 days PT Duration Estimated Length of Stay: 7-10 days OT Intensity: Minimum of 1-2 x/day, 45 to 90 minutes OT Frequency: 5 out of 7 days OT Duration/Estimated Length of Stay: 7-10 days SLP Intensity: Minumum of 1-2 x/day, 30 to 90 minutes SLP Frequency: 1 to 3 out of 7 days SLP Duration/Estimated Length of Stay: 7-10 days   Team Interventions: Nursing Interventions Patient/Family Education, Skin Care/Wound Management, Bladder Management, Bowel Management, Disease Management/Prevention, Discharge  Planning, Pain Management, Medication Management  PT interventions Discharge planning, Functional mobility training, Psychosocial support, Therapeutic Activities, Balance/vestibular training, Disease management/prevention, Neuromuscular re-education, Therapeutic Exercise, Wheelchair propulsion/positioning, DME/adaptive equipment instruction, Pain management, UE/LE Strength taining/ROM, Firefighter, Equities trader education, UE/LE Coordination activities  OT Interventions Warden/ranger, Patient/family education, Therapeutic Exercise, Functional mobility training, Self Care/advanced ADL retraining, UE/LE Strength taining/ROM, UE/LE Coordination activities, Neuromuscular re-education, Discharge planning, Disease mangement/prevention, Pain management, Wheelchair propulsion/positioning, Therapeutic Activities, Splinting/orthotics, Skin care/wound managment, Psychosocial support, DME/adaptive equipment instruction, Cognitive remediation/compensation  SLP Interventions Patient/family education, Internal/external aids  TR Interventions    SW/CM Interventions Discharge Planning, Psychosocial Support, Patient/Family Education   Barriers to Discharge MD  Medical stability, Home enviroment access/loayout, Incontinence, Neurogenic bowel and bladder, Wound care, Lack of/limited family support, Weight, and Behavior  Nursing Decreased caregiver support, Wound Care 2 level main B+B; stair lift, DME w spouse  PT Neurogenic Bowel & Bladder, Decreased caregiver support, Lack of/limited family support decreased caregiver support as spouse works during day  Fish farm manager, Inaccessible home environment, Decreased caregiver support, Other (comments) pain, husband works Teacher, English as a foreign language, unable to access 2nd floor  SLP      SW       Team Discharge Planning: Destination: PT-Home ,OT- Home , SLP-Home Projected Follow-up: PT-Home health PT, OT-  Home health OT, SLP-24 hour  supervision/assistance, Outpatient SLP Projected Equipment Needs: PT-To be determined, OT- None recommended by OT, SLP-None recommended by SLP Equipment Details: PT-pt owns PWC, manual w/c, BSC, rollator, RW, OT-issued  LH sponge, has splint and all needed DME and power and manual w/c in place Patient/family involved in discharge planning: PT- Patient,  OT-Patient, SLP-Patient  MD ELOS: 7-10 days Medical Rehab Prognosis:  Good Assessment: The patient has been admitted for CIR therapies with the diagnosis of progression of secondary progressive MS with debility secondary. The team will be addressing functional mobility, strength, stamina, balance, safety, adaptive techniques and equipment, self-care, bowel and bladder mgt, patient and caregiver education, . Goals have been set at min A. Anticipated discharge destination is home with husband.        See Team Conference Notes for weekly updates to the plan of care

## 2022-04-07 MED ORDER — TAPENTADOL HCL 50 MG PO TABS
75.0000 mg | ORAL_TABLET | Freq: Four times a day (QID) | ORAL | Status: DC | PRN
  Administered 2022-04-07 – 2022-05-02 (×61): 75 mg via ORAL
  Filled 2022-04-07 (×63): qty 2

## 2022-04-07 NOTE — Progress Notes (Signed)
Patient tearful at times throughout the shift. Patient upset at current situation. Encouraged patient to be positive and chat with family members. Continue to reposition every 2 hours using pillows for extra comfort and protection under bony prominences. Heels, Sacrum and left thigh foam lift dressing  CDI. Call light and personal items within reach.

## 2022-04-07 NOTE — Progress Notes (Signed)
Occupational Therapy Session Note  Patient Details  Name: Sharon Odom MRN: [id number] Date of Birth: 07-Aug-1970  Today's Date: 04/07/2022 OT Individual Time: 6967-8938 OT Individual Time Calculation (min): 60 min    Short Term Goals: Week 1:  OT Short Term Goal 1 (Week 1): STG=LTG d/t ELOS  Skilled Therapeutic Interventions/Progress Updates:    Pt received in bed requesting to get dressed.   Mod [place] on BUE: 0 Overall, pt requiring more A today with bed mobility, sitting balance, sit to stands, dressing than she has based on previous OT notes.  Tried to give pt as much time as possible to adjust and adapt, but overall she was having difficulty holding static sit on EOB as achieve a symmetrical pelvic position to allow her better balance was challenging this am. Opted to switch to wc to dress.   Pt stood to RW with mod A but L knee internally rotating and she was having great difficulty extending it.  Pt then requiring max A and it was NOT safe to try to step to wc.  Pt agreeable to using stedy.  With this she was able to rise to stand with only CGA and then she was able to stand fully upright with good extension of LLE for increased standing stability.  Used the stedy to transfer to power wc and 2 more times during LB dressing.  From height of power wc, getting pants over feet was difficult so assisted pt.  Pt very motivated and trying hard but after finishing she was somewhat emotional and said " I feel like I should just throw in the towel!".  Used therapeutic use of self to counsel and encourage patient on what she has accomplished and reminded her I was a new therapist to her and may not have been facilitating techniques in the same way as her primary therapists do.   Pt resting in wc to complete oral care and make up at the sink.  RN and NT aware.   Therapy Documentation Precautions:  Precautions Precautions: Fall Required Braces or Orthoses: Splint/Cast Splint/Cast: L resting hand  splint Restrictions Weight Bearing Restrictions: No    Vital Signs: Therapy Vitals Temp: 97.9 F (36.6 C) Temp Source: Oral Pulse Rate: 67 Resp: 18 BP: 115/70 Patient Position (if appropriate): Lying Oxygen Therapy SpO2: 100 % O2 Device: Room Air Pain:  No c/o pain during session - pt tolerated well.      Therapy/Group: Individual Therapy  Myleah Cavendish 04/07/2022, 7:52 AM

## 2022-04-07 NOTE — Progress Notes (Signed)
PROGRESS NOTE   Subjective/Complaints:  No issues overnite  ROS:  Pt denies SOB, abd pain, CP, N/V/C/D, and vision changes  Except for HPI  Objective:   No results found. No results for input(s): "WBC", "HGB", "HCT", "PLT" in the last 72 hours.  No results for input(s): "NA", "K", "CL", "CO2", "GLUCOSE", "BUN", "CREATININE", "CALCIUM" in the last 72 hours.   Intake/Output Summary (Last 24 hours) at 04/07/2022 1401 Last data filed at 04/07/2022 0753 Gross per 24 hour  Intake 240 ml  Output 1600 ml  Net -1360 ml      Pressure Injury 03/31/22 Sacrum Medial Deep Tissue Pressure Injury - Purple or maroon localized area of discolored intact skin or blood-filled blister due to damage of underlying soft tissue from pressure and/or shear. (Active)  03/31/22 0815  Location: Sacrum  Location Orientation: Medial  Staging: Deep Tissue Pressure Injury - Purple or maroon localized area of discolored intact skin or blood-filled blister due to damage of underlying soft tissue from pressure and/or shear.  Wound Description (Comments):   Present on Admission: Yes     Pressure Injury 04/03/22 Thigh Left;Posterior;Proximal Stage 2 -  Partial thickness loss of dermis presenting as a shallow open injury with a red, pink wound bed without slough. red pink (Active)  04/03/22 1751  Location: Thigh  Location Orientation: Left;Posterior;Proximal  Staging: Stage 2 -  Partial thickness loss of dermis presenting as a shallow open injury with a red, pink wound bed without slough.  Wound Description (Comments): red pink  Present on Admission: Yes     Pressure Injury 04/03/22 Ankle Left;Posterior Deep Tissue Pressure Injury - Purple or maroon localized area of discolored intact skin or blood-filled blister due to damage of underlying soft tissue from pressure and/or shear. 1x1 (Active)  04/03/22 1752  Location: Ankle  Location Orientation:  Left;Posterior  Staging: Deep Tissue Pressure Injury - Purple or maroon localized area of discolored intact skin or blood-filled blister due to damage of underlying soft tissue from pressure and/or shear.  Wound Description (Comments): 1x1  Present on Admission: Yes    Physical Exam: Vital Signs Blood pressure (!) 90/57, pulse 68, temperature 98.4 F (36.9 C), temperature source Oral, resp. rate 17, height 5\' 9"  (1.753 m), weight 56.2 kg, SpO2 99 %.    General: No acute distress Mood and affect are appropriate Heart: Regular rate and rhythm no rubs murmurs or extra sounds Lungs: Clear to auscultation, breathing unlabored, no rales or wheezes Abdomen: Positive bowel sounds, soft nontender to palpation, nondistended Extremities: No clubbing, cyanosis, or edema  MSK no pain with LE ROM at knee and ankle    LLE- back to baseline  Skin:    General: Skin is warm and dry.     Comments: New DTI on coccyx  Also worsening skin open/pressure Stage II on posterior thigh- on R thigh Also small DTI on R heel  Neurological:     Mental Status: She is alert.     Comments: MAS 2 RLE, MAS 1 LLE  Motor 5/5 RUE 3+ LUE 2- RLE 1/5 LLE     Assessment/Plan: 1. Functional deficits which require 3+ hours per day of interdisciplinary therapy  in a comprehensive inpatient rehab setting. Physiatrist is providing close team supervision and 24 hour management of active medical problems listed below. Physiatrist and rehab team continue to assess barriers to discharge/monitor patient progress toward functional and medical goals  Care Tool:  Bathing    Body parts bathed by patient: Left arm, Chest, Abdomen, Front perineal area, Buttocks, Right upper leg, Left upper leg, Face, Right arm   Body parts bathed by helper: Right lower leg, Left lower leg     Bathing assist Assist Level: Moderate Assistance - Patient 50 - 74%     Upper Body Dressing/Undressing Upper body dressing   What is the patient  wearing?: Pull over shirt, Bra    Upper body assist Assist Level: Minimal Assistance - Patient > 75%    Lower Body Dressing/Undressing Lower body dressing      What is the patient wearing?: Pants, Underwear/pull up     Lower body assist Assist for lower body dressing: Maximal Assistance - Patient 25 - 49%     Toileting Toileting Toileting Activity did not occur (Clothing management and hygiene only): N/A (no void or bm)  Toileting assist Assist for toileting: Moderate Assistance - Patient 50 - 74%     Transfers Chair/bed transfer  Transfers assist     Chair/bed transfer assist level: Maximal Assistance - Patient 25 - 49%     Locomotion Ambulation   Ambulation assist   Ambulation activity did not occur: Safety/medical concerns (unable to perform due to pain, weakness and decreased activity tolerance)          Walk 10 feet activity   Assist  Walk 10 feet activity did not occur: Safety/medical concerns (unable to perform due to pain, weakness and decreased activity tolerance)        Walk 50 feet activity   Assist Walk 50 feet with 2 turns activity did not occur: Safety/medical concerns (unable to perform due to pain, weakness and decreased activity tolerance)         Walk 150 feet activity   Assist Walk 150 feet activity did not occur: Safety/medical concerns (unable to perform due to pain, weakness and decreased activity tolerance)         Walk 10 feet on uneven surface  activity   Assist Walk 10 feet on uneven surfaces activity did not occur: Safety/medical concerns (unable to perform due to pain, weakness and decreased activity tolerance)         Wheelchair     Assist Is the patient using a wheelchair?: Yes Type of Wheelchair: Power    Wheelchair assist level: Supervision/Verbal cueing Max wheelchair distance: 300 ft    Wheelchair 50 feet with 2 turns activity    Assist        Assist Level: Supervision/Verbal cueing    Wheelchair 150 feet activity     Assist      Assist Level: Supervision/Verbal cueing   Blood pressure (!) 90/57, pulse 68, temperature 98.4 F (36.9 C), temperature source Oral, resp. rate 17, height 5\' 9"  (1.753 m), weight 56.2 kg, SpO2 99 %.  Medical Problem List and Plan: 1. Functional deficits due to secondary progressive multiple sclerosis and debility from prolonged admission on acute floor for encephalopathy             -patient may  shower             -ELOS/Goals: min A to supervision- 7-10 days  Con't CIR- PT, OT and SLP- IPOC today 2.  Antithrombotics: -  DVT/anticoagulation:  Pharmaceutical: Lovenox             -antiplatelet therapy: none 3. Pain Management:              -Naproxen 500 mg twice daily             -Tylenol as needed        will change to Nucynta 75 mg q6 hours - will not add Methadone due to chance of polypharmacy    4. Mood/Behavior/Sleep: LCSW to evaluate and provide emotional support             -antipsychotic agents: n/a             -Wellbutrin XL 300 mg daily             -amantadine 100 mg TID- per Dr Epimenio Foot 5. Neuropsych/cognition: This patient is capable of making decisions on her own behalf. 6. Skin/Wound Care: Routine skin care checks  11/7- has new DTI on coccyx, worsening now stage II on posterior R thigh and small DTI R heel- added prevalon boots in bed; pt will NOT use air mattress- changing to regular bed due to pt insistence; and foam dressing and turn q2 hours.   11/9- pt doesn't like prevalon boots, but educated on the importance 7. Fluids/Electrolytes/Nutrition: Routine Is and Os and follow-up chemistries             -continue Vitamin C, D3, B12 and fiber supplements 8: MS with spasticity: continue dalfampridine BID and [place]              -Baclofen 20 mg twice daily, 40 mg every morning             -Valium 5 mg every 8 hours             -Zanaflex 4 mg twice daily- were reduced due to cognition/sedation on acute- will change out  /refill pump Thursday as well as increase dosing of ITB pump  11/8- spasticity slightly more than normal, however increasing ITB pump tomorrow, so will not increase spasticity PO meds  11/9- increasing ITB pump today- and refilling it.   11/10- pump up to 540 mcg/day 9: GERD: restart Zantac 10: Neurogenic bladder with indwelling Foley; s/p UTI treatment with Zosyn 11: Bowel program: Nutrisource fiber, psyllium caps, Colace, MiraLax, Senekot-S given daily             -placed PRN sorbitol and Fleets orders- has gotten really constipated/needing multiple enemas-   11/9- will increase miralax to BID x 3 days since increasing pump- no BM since admission to rehab 11/7 12: Dysphagia: ? resolved; now on regular diet; SLP eval 13: Seasonal allergies off home meds: monitor 14: Tooth abscess: missed follow-up appointment; completed Flagyl 15: Hypokalemia: on K+ supplement QOD; follow-up BMP tomorrow- likely due to previous Lasix dose for Edema.    11/8- K+ 4.1 16. Insomnia  11/9- will add Trazodone 50 mg QHS and can titrate up as required  11/10- increase to 150 mg QHS     LOS: 4 days A FACE TO FACE EVALUATION WAS PERFORMED  Erick Colace 04/07/2022, 2:01 PM

## 2022-04-08 NOTE — Progress Notes (Signed)
Physical Therapy Session Note  Patient Details  Name: Sharon Odom MRN: [id number] Date of Birth: 27-May-1971  Today's Date: 04/08/2022 PT Individual Time: 2426-8341, 9622-2979 PT Individual Time Calculation (min): 63 min, 72 min   Short Term Goals: Week 1:  PT Short Term Goal 1 (Week 1): STG=LTG due ELOS  Skilled Therapeutic Interventions/Progress Updates:      Therapy Documentation Precautions:  Precautions Precautions: Fall Required Braces or Orthoses: Splint/Cast Splint/Cast: L resting hand splint Restrictions Weight Bearing Restrictions: No  Treatment Session 1:  Pt received seated in PWC at bedside, agreeable to PT session. Pt reports 10/10 L LE spasms, nursing notified and administered pain medication. PT provided PROM to left hip, knee and ankle and to right ankle and knee and pt reported improvement in pain. Pt requests need to toilet and required max A with stand pivot transfer to [facility]. Pt negative for bowel movement and left seated in PWC at bedside with nursing present.    Treatment Session 2:  Pt received seated in PWC at bedside, agreeable to PT session with emphasis on transfer training. Pt requires (S) for w/c navigation to main gym and verbal cues to decrease PWC speed in hallways, pt with decreased safety awareness. PT educated pt on alternative method for w/c transfers via squat pivot technique. Pt participated in blocked practice of squat pivot transfers from [facility] <>mat with mod A and verbal cues for sequencing. Pt continues to want to utilize stand pivot transfers with RW as main technique  and PT provided education on the risks associated. Pt attempted stand pivot transfer with RW and unsuccessful. PT performed max A transfer to [facility] for time management and pt navigated to room and left seated in PWC at bedside. Pt declines safety alarm.    04/08/22 1211  RLE Tone  RLE Tone Modified [place]  Body Part - Modified [place] Scale Hamstrings;Gastrocnemius  Hamstrings  - Modified [place] Scale for Grading Hypertonia RLE 3  GASTROCNEMIUS - Modified [place] Scale for Grading Hypertonia RLE 3  QUADRICEPS - Modified [place] Scale for Grading Hypertonia RLE 3    04/08/22 1212  LLE Tone  LLE Tone Modified [place]  Body Part - Modified [place] Scale Hamstrings;Gastrocnemius  Hamstrings - Modified [place] Scale for Grading Hypertonia LLE 1  Gastrocnemius - Modified [place] Scale for Grading Hypertonia LLE 1     Therapy/Group: Individual Therapy  [name] [name] PT, DPT  04/08/2022, 7:51 AM

## 2022-04-08 NOTE — Progress Notes (Signed)
Occupational Therapy Session Note  Patient Details  Name: Sharon Odom MRN: [id number] Date of Birth: 13-Nov-1970  Today's Date: 04/08/2022 OT Individual Time: 0730-0900 OT Individual Time Calculation (min): 90 min    Short Term Goals: Week 2:    Week 3:      Skilled Therapeutic Interventions/Progress Updates:    Pt greeted at time of session sitting up in bed with HOB elevated eating breakfast, pt requesting to take a shower at this time. 2/2 time constraints and pt feeling "weak" today, using stedy with sit > stand MIN A for shower transfer and pt performing showering on BSC, OT managing foley bag. Bathing tasks MOD A with pt able to reach buttocks in cut out and OT assisting with BLEs past knee level. Drying off seated and in standing at stedy for buttocks. Stedy shower > bed > power chair. Bed level for fully drying and dressing with MAX A for donning underwear and threading foley, dressing this date with pull over dress and tank top with MOD/MAX A 2/2 clinging fabric and pt fatigue. Pt attempting to put in hair products but needing assist 2/2 LUE weakness. Pt with frequent posterior and L side LOB and OT assist to correct but pt attempting. Up in power chair, OT changing heel protection pads and donning socks for time. Per note in room, pt cleared to be up in power chair per MD without supervision. Note extensive amount of time needed for all tasks 2/2 MS slow movements and needing frequent particular assistance.   Therapy Documentation Precautions:  Precautions Precautions: Fall Required Braces or Orthoses: Splint/Cast Splint/Cast: L resting hand splint Restrictions Weight Bearing Restrictions: No    Therapy/Group: Individual Therapy  Erasmo Score 04/08/2022, 9:35 AM

## 2022-04-09 LAB — CBC
HCT: 31.7 % — ABNORMAL LOW (ref 36.0–46.0)
Hemoglobin: 10.3 g/dL — ABNORMAL LOW (ref 12.0–15.0)
MCH: 32.3 pg (ref 26.0–34.0)
MCHC: 32.5 g/dL (ref 30.0–36.0)
MCV: 99.4 fL (ref 80.0–100.0)
Platelets: 277 10*3/uL (ref 150–400)
RBC: 3.19 MIL/uL — ABNORMAL LOW (ref 3.87–5.11)
RDW: 13.2 % (ref 11.5–15.5)
WBC: 8 10*3/uL (ref 4.0–10.5)
nRBC: 0 % (ref 0.0–0.2)

## 2022-04-09 LAB — BASIC METABOLIC PANEL
Anion gap: 7 (ref 5–15)
BUN: 17 mg/dL (ref 6–20)
CO2: 29 mmol/L (ref 22–32)
Calcium: 9.4 mg/dL (ref 8.9–10.3)
Chloride: 105 mmol/L (ref 98–111)
Creatinine, Ser: 0.79 mg/dL (ref 0.44–1.00)
GFR, Estimated: >60 mL/min (ref >60)
Glucose, Bld: 94 mg/dL (ref 70–99)
Potassium: 4.3 mmol/L (ref 3.5–5.1)
Sodium: 141 mmol/L (ref 135–145)

## 2022-04-09 MED ORDER — CHLORHEXIDINE GLUCONATE CLOTH 2 % EX PADS
6.0000 | MEDICATED_PAD | Freq: Two times a day (BID) | CUTANEOUS | Status: DC
  Administered 2022-04-10 – 2022-05-04 (×48): 6 via TOPICAL

## 2022-04-09 MED ORDER — ACETAMINOPHEN 325 MG PO TABS
650.0000 mg | ORAL_TABLET | Freq: Four times a day (QID) | ORAL | Status: DC | PRN
  Administered 2022-04-10 – 2022-04-12 (×6): 650 mg via ORAL
  Filled 2022-04-09 (×6): qty 2

## 2022-04-09 NOTE — Progress Notes (Signed)
Occupational Therapy Session Note  Patient Details  Name: Sharon Odom MRN: [id number] Date of Birth: 12-15-1970  Today's Date: 04/09/2022 OT Individual Time: 3212-2482 OT Individual Time Calculation (min): 70 min    Short Term Goals: Week 1:  OT Short Term Goal 1 (Week 1): STG=LTG d/t ELOS  Skilled Therapeutic Interventions/Progress Updates:    Upon OT arrival, pt sem recumbent in bed requesting to shower. Pt was reminded she had a shower yesterday and reports she did not remember doing it. Pt requesting to wear her dress today and this therapist was unable to find it in her room but then therapist discovered pt had dress on and pt was unaware. Cognitive changes noted. Pt agreeable to LB dressing with AE, UB dressing, and functional transfer into TIS. Pt performs LB dressing (donning underwear and pants)  bed level with Max A using reacher requiring increased time and effort to complete. Pt performs supine to sit transfer with use of bed controls with Mod A and completes sit to stand transfer using stedy with Mod A and total A to manage underwear and pants over hips. Pt sits with CGA EOB to coff/donn UB clothing with Max A. Pt performs sit to stand in stedy with Mod A and was transferred into TIS with Total A. Pt was repositioned in TIS with Mod A and left in TIS with all needs met. Pt limited by decreased balance, decreased cognition, decreased strength and continues to benefit form OT services to achieve highest level of independence.   Therapy Documentation Precautions:  Precautions Precautions: Fall Required Braces or Orthoses: Splint/Cast Splint/Cast: L resting hand splint Restrictions Weight Bearing Restrictions: No   Therapy/Group: Individual Therapy  Marvetta Gibbons 04/09/2022, 12:10 PM

## 2022-04-09 NOTE — Plan of Care (Signed)
  Problem: RH Memory Goal: LTG Patient will use memory compensatory aids to (SLP) Description: LTG:  Patient will use memory compensatory aids to recall biographical/new, daily complex information with cues (SLP) Flowsheets (Taken 04/09/2022 1347) LTG: Patient will use memory compensatory aids to (SLP): Minimal Assistance - Patient > 75%   Problem: RH Problem Solving Goal: LTG Patient will demonstrate problem solving for (SLP) Description: LTG:  Patient will demonstrate problem solving for basic/complex daily situations with cues  (SLP) Flowsheets (Taken 04/09/2022 1347) LTG: Patient will demonstrate problem solving for (SLP): Basic daily situations LTG Patient will demonstrate problem solving for: Minimal Assistance - Patient > 75%   Problem: RH Attention Goal: LTG Patient will demonstrate this level of attention during functional activites (SLP) Description: LTG:  Patient will will demonstrate this level of attention during functional activites (SLP) Flowsheets (Taken 04/09/2022 1347) Patient will demonstrate during cognitive/linguistic activities the attention type of: Sustained Patient will demonstrate this level of attention during cognitive/linguistic activities in: Controlled LTG: Patient will demonstrate this level of attention during cognitive/linguistic activities with assistance of (SLP): Minimal Assistance - Patient > 75% Number of minutes patient will demonstrate attention during cognitive/linguistic activities: 10-15   Problem: RH Awareness Goal: LTG: Patient will demonstrate awareness during functional activites type of (SLP) Description: LTG: Patient will demonstrate awareness during functional activites type of (SLP) Flowsheets (Taken 04/09/2022 1347) Patient will demonstrate during cognitive/linguistic activities awareness type of: Emergent LTG: Patient will demonstrate awareness during cognitive/linguistic activities with assistance of (SLP): Minimal Assistance - Patient  > 75%

## 2022-04-09 NOTE — Progress Notes (Signed)
Speech Language Pathology Weekly Progress and Session Note  Patient Details  Name: Sharon Odom MRN: [id number] Date of Birth: [birth year]  Beginning of progress report period: April 04, 2022 End of progress report period: April 09, 2022  Today's Date: 04/09/2022 SLP Individual Time: 1300-1330 SLP Individual Time Calculation (min): 30 min  Short Term Goals: Week 1: SLP Short Term Goal 1 (Week 1): Pt will participate in education and demonstrate use of internal/external recall strategies with supervision A verbal cues. SLP Short Term Goal 1 - Progress (Week 1): Not met    New Short Term Goals: Week 2: SLP Short Term Goal 1 (Week 2): Pt will demonstrate sustained attention in 3-5 minute intervals with supervision A direction. SLP Short Term Goal 2 (Week 2): Pt will demonstrate recall of daily information with min A verbal cues for external aids. SLP Short Term Goal 3 (Week 2): Pt will complete basic problem solving skills with min A verbal cues. SLP Short Term Goal 4 (Week 2): Pt will self-monitor and self-correct functional errors with min A verbal cues.  Weekly Progress Updates: Pt demonstrated a functional decline, therefore ST services increased from x1-3 to x3-5 days a week. Pt scored 19/30 (n=>27) on SLUMS indicating moderate-severe deficits in memory and attention, impacting problem solving and emergent awareness. Pt supports swallow and speech are slightly declining as well, SLP will keep a close eye on need to re-evaluate those skills. Pt's barriers at discharge are need for 24 hour supervision, reduced attention, reduced memory, reduced problem solving and reduced emergent awareness. Pt would continue to benefit from skilled ST services in order to maximize functional independence and reduce burden of care, likely requiring 24 hour supervision at discharge with continued skilled ST services.      Intensity: Minumum of 1-2 x/day, 30 to 90 minutes Frequency: 3 to 5 out of 7  days Duration/Length of Stay: Home verse SNF Treatment/Interventions: Patient/family education;Functional tasks;Internal/external aids;Cueing hierarchy;Cognitive remediation/compensation   Daily Session  Skilled Therapeutic Interventions: Skilled ST services focused on cognitive skills. Expressed " I am declining. My right leg is getting weaker and I am confused." Pt began crying and SLP provided emotional support.SLP facilitated assessment of cognitive skills administering SLUMS, pt scored 19/30 (n=>27) indicating moderate-severe deficits in memory and attention, further impacting problem solving and ability to correct errors. Pt agreed to increase ST services and focus on cognitive decline. SLP updated plan of care adding cognition goals and downgrading to min A. Pt was left in room with call bell within reach and chair alarm set. SLP recommends to continue skilled services.  General    Pain Pain Assessment Pain Scale: 0-10 Pain Score: 3  Faces Pain Scale: Hurts even more Pain Type: Acute pain Pain Location: Arm Pain Orientation: Left Pain Descriptors / Indicators: Aching Pain Onset: On-going Patients Stated Pain Goal: 0 Pain Intervention(s): RN made aware;Medication (See eMAR)  Therapy/Group: Individual Therapy  Latriece Anstine  [facility] 04/09/2022, 2:07 PM

## 2022-04-09 NOTE — Progress Notes (Signed)
Patient ID: Sharon Odom, female   DOB: [birth year], 51 y.o.   MRN: [id number]  Met with pt to see how doing she is down due to has gotten worse since coming into the hospital instead of better. She is considering hiring assist for a few hours but concerned about the cost. Will get her a private duty list she has also been on-line with [email or web address]. She reports she is trying her best but her left arm and leg are weaker. Will reach out to the insurance to see if they can offer any more assist then just home health.

## 2022-04-09 NOTE — Discharge Summary (Signed)
Physician Discharge Summary  Patient ID: Sharon Odom MRN: [id number] DOB/AGE: [birth year] 51 y.o.  Admit date: 04/03/2022 Discharge date: 05/04/2022  Discharge Diagnoses:  Principal Problem:   Multiple sclerosis ([place]) Active Problems:   Spasticity   Pressure injury of skin GERD Neurogenic bowel Neurogenic bladder Seasonal allergies Tooth abscess Hypokalemia UTI Constipation Cervical dystonia BLE edema Diffuse pain Depression Insomnia  Discharged Condition: stable  Significant Diagnostic Studies:  Lower Venous DVT Study   Patient Name:  Sharon Odom  Date of Exam:   04/23/2022  Medical Rec #: [id number]     Accession #:    [id number]  Date of Birth: 04-May-1971     Patient Gender: F  Patient Age:   33 years  Exam Location:  [facility]  Procedure:      VAS [place] LOWER EXTREMITY VENOUS (DVT)  Referring Phys: MEGAN LOVORN    ---------------------------------------------------------------------------  -----    Indications: New swelling and progression of weakness, left in patient  with MS.    Comparison Study: 04-04-2019 Prior bilateral lower extremity venous study  was                   negative for DVT.   Performing Technologist: Darlin Coco RDMS, RVT     Examination Guidelines:  A complete evaluation includes B-mode imaging, spectral Doppler, color  Doppler,  and power Doppler as needed of all accessible portions of each vessel.  Bilateral  testing is considered an integral part of a complete examination. Limited  examinations for reoccurring indications may be performed as noted. The  reflux  portion of the exam is performed with the patient in reverse  Trendelenburg.      +-----+---------------+---------+-----------+----------+--------------+  RIGHTCompressibilityPhasicitySpontaneityPropertiesThrombus Aging  +-----+---------------+---------+-----------+----------+--------------+  CFV Full           Yes      Yes                                   +-----+---------------+---------+-----------+----------+--------------+         +---------+---------------+---------+-----------+----------+--------------+   LEFT    CompressibilityPhasicitySpontaneityPropertiesThrombus  Aging  +---------+---------------+---------+-----------+----------+--------------+   CFV     Full           Yes      Yes                                   +---------+---------------+---------+-----------+----------+--------------+   SFJ     Full                                                          +---------+---------------+---------+-----------+----------+--------------+   FV Prox  Full                                                          +---------+---------------+---------+-----------+----------+--------------+   FV Mid   Full                                                          +---------+---------------+---------+-----------+----------+--------------+  FV DistalFull                                                          +---------+---------------+---------+-----------+----------+--------------+   PFV     Full                                                          +---------+---------------+---------+-----------+----------+--------------+   POP     Full           Yes      Yes                                   +---------+---------------+---------+-----------+----------+--------------+   PTV     Full                                                          +---------+---------------+---------+-----------+----------+--------------+   PERO    Full                                                          +---------+---------------+---------+-----------+----------+--------------+    Summary:  RIGHT:  - No evidence of common femoral vein obstruction.    LEFT:  - There is no evidence of deep vein thrombosis in the lower extremity.    - No cystic  structure found in the popliteal fossa.     *See table(s) above for measurements and observations.   Electronically signed by Harold Barban MD on 04/23/2022 at 9:03:20 PM.        Final     Narrative & Impression  CLINICAL DATA:  Multiple sclerosis with left worse than right spasticity following intrathecal baclofen pump.   EXAM: MRI HEAD WITHOUT AND WITH CONTRAST   TECHNIQUE: Multiplanar, multiecho pulse sequences of the brain and surrounding structures were obtained without and with intravenous contrast.   CONTRAST:  29m GADAVIST GADOBUTROL 1 MMOL/ML IV SOLN   COMPARISON:  Brain MRI 03/26/2022   FINDINGS: Brain: Again seen are numerous foci of FLAIR signal abnormality in the juxtacortical, periventricular, and infratentorial white matter as well as the brainstem consistent with multiple sclerosis, overall unchanged since the MRI from 03/26/2022. There is no diffusion restriction or abnormal enhancement to suggest active demyelination. Previously seen enhancement of the lesion in the left occipital lobe has resolved.   There is no acute intracranial hemorrhage, extra-axial fluid collection, or acute infarct.   Background parenchymal volume is normal. The ventricles are normal in size. Gray-white differentiation is preserved   There is no mass lesion.  There is no mass effect or midline shift.   Vascular: Normal flow voids.   Skull and upper cervical spine: Normal marrow signal. The cervical cord is assessed on the separately dictated MRI  cervical spine.   Sinuses/Orbits: The paranasal sinuses are clear. The globes and orbits are unremarkable. The optic nerves are grossly unremarkable on these nondedicated sequences.   Other: None.   IMPRESSION: Extensive signal abnormality in the supratentorial and infratentorial brain and brainstem consistent with multiple sclerosis, similar to the prior study with no new lesion or evidence of active demyelination.  Previously seen enhancement of the left occipital lobe lesion has resolved.     Electronically Signed   By: Valetta Mole M.D.   On: 04/18/2022 11:40   Narrative & Impression  CLINICAL DATA:  Multiple sclerosis with left worse than right spasticity and worsening symptoms.   EXAM: MRI CERVICAL AND THORACIC SPINE WITHOUT AND WITH CONTRAST   TECHNIQUE: Multiplanar and multiecho pulse sequences of the cervical spine, to include the craniocervical junction and cervicothoracic junction, and the thoracic spine, were obtained without and with intravenous contrast.   CONTRAST:  9m GADAVIST GADOBUTROL 1 MMOL/ML IV SOLN   COMPARISON:  Cervical and thoracic spine MRI 11/12/2021   FINDINGS: MRI CERVICAL SPINE FINDINGS   Alignment: Normal.   Vertebrae: Vertebral body heights are preserved. Background marrow signal is normal. There is no suspicious marrow signal abnormality or marrow edema. There is no abnormal marrow enhancement. Benign intraosseous hemangiomas in the C7 and T1 vertebral bodies are unchanged.   Cord: There is unchanged signal abnormality in the left dorsal aspect of the cord at C3-C4 (15-26). Previously seen signal abnormality in the right aspect of the cord at C6 is no longer appreciated there is no abnormal enhancement to suggest active demyelination.   Posterior Fossa, vertebral arteries, paraspinal tissues: The posterior fossa is assessed on the separately dictated brain MRI. The vertebral artery flow voids are normal. The paraspinal soft tissues are unremarkable.   Disc levels:   There is overall mild multilevel degenerative change of the cervical spine, stable since the prior study and described in detail on that report. There is up to mild spinal canal stenosis and moderate bilateral neural foraminal stenosis at C5-C6.   MRI THORACIC SPINE FINDINGS   Alignment:  Normal.   Vertebrae: Vertebral body heights are preserved. Background marrow signal is  normal. There is no suspicious marrow signal abnormality or marrow edema. There is no abnormal marrow enhancement. A few benign intraosseous hemangiomas are again seen.   Cord: Evaluation of the cord is degraded by motion artifact. There is no definite convincing evidence of demyelination in the thoracic cord on the current study. Previously seen signal abnormality on the study from 06/18 23 May have been artifactual.   Paraspinal and other soft tissues: Unremarkable.   Disc levels:   There is no disc herniation. There is no significant spinal canal or neural foraminal stenosis.   IMPRESSION: 1. Small focus of cord signal abnormality at C3-C4 is unchanged, consistent with demyelinating disease. Additional previously seen signal abnormality in the cervical cord on the study from 11/12/2021 is not appreciated on the current study. 2. Previously seen signal abnormality in the thoracic cord on the study from 11/12/2021 is not seen on the current study and may have been artifactual. No convincing evidence of demyelinating disease in the thoracic cord. 3. No abnormal enhancement to suggest active demyelination.     Electronically Signed   By: PValetta MoleM.D.   On: 04/18/2022 11:57   Narrative & Impression  CLINICAL DATA:  Multiple sclerosis with left worse than right spasticity and worsening symptoms.   EXAM: MRI CERVICAL AND THORACIC SPINE WITHOUT  AND WITH CONTRAST   TECHNIQUE: Multiplanar and multiecho pulse sequences of the cervical spine, to include the craniocervical junction and cervicothoracic junction, and the thoracic spine, were obtained without and with intravenous contrast.   CONTRAST:  51m GADAVIST GADOBUTROL 1 MMOL/ML IV SOLN   COMPARISON:  Cervical and thoracic spine MRI 11/12/2021   FINDINGS: MRI CERVICAL SPINE FINDINGS   Alignment: Normal.   Vertebrae: Vertebral body heights are preserved. Background marrow signal is normal. There is no suspicious  marrow signal abnormality or marrow edema. There is no abnormal marrow enhancement. Benign intraosseous hemangiomas in the C7 and T1 vertebral bodies are unchanged.   Cord: There is unchanged signal abnormality in the left dorsal aspect of the cord at C3-C4 (15-26). Previously seen signal abnormality in the right aspect of the cord at C6 is no longer appreciated there is no abnormal enhancement to suggest active demyelination.   Posterior Fossa, vertebral arteries, paraspinal tissues: The posterior fossa is assessed on the separately dictated brain MRI. The vertebral artery flow voids are normal. The paraspinal soft tissues are unremarkable.   Disc levels:   There is overall mild multilevel degenerative change of the cervical spine, stable since the prior study and described in detail on that report. There is up to mild spinal canal stenosis and moderate bilateral neural foraminal stenosis at C5-C6.   MRI THORACIC SPINE FINDINGS   Alignment:  Normal.   Vertebrae: Vertebral body heights are preserved. Background marrow signal is normal. There is no suspicious marrow signal abnormality or marrow edema. There is no abnormal marrow enhancement. A few benign intraosseous hemangiomas are again seen.   Cord: Evaluation of the cord is degraded by motion artifact. There is no definite convincing evidence of demyelination in the thoracic cord on the current study. Previously seen signal abnormality on the study from 06/18 23 May have been artifactual.   Paraspinal and other soft tissues: Unremarkable.   Disc levels:   There is no disc herniation. There is no significant spinal canal or neural foraminal stenosis.   IMPRESSION: 1. Small focus of cord signal abnormality at C3-C4 is unchanged, consistent with demyelinating disease. Additional previously seen signal abnormality in the cervical cord on the study from 11/12/2021 is not appreciated on the current study. 2. Previously  seen signal abnormality in the thoracic cord on the study from 11/12/2021 is not seen on the current study and may have been artifactual. No convincing evidence of demyelinating disease in the thoracic cord. 3. No abnormal enhancement to suggest active demyelination.     Electronically Signed   By: PValetta MoleM.D.   On: 04/18/2022 11:57   Labs:  Basic Metabolic Panel: Recent Labs  Lab 04/30/22 0737  NA 143  K 4.1  CL 108  CO2 29  GLUCOSE 108*  BUN 18  CREATININE 0.71  CALCIUM 9.6    CBC: Recent Labs  Lab 04/30/22 0737  WBC 6.3  HGB 10.4*  HCT 31.3*  MCV 99.1  PLT 171     Brief HPI:   CRayonna Heldmanis a 51y.o. female with a history of secondary progressive multiple sclerosis. She has had significant left greater than right spasticity with subsequent implantation of intrathecal pump for baclofen administration. She underwent revision of the abdominal portion of the pump by Dr. DReatha Armouron 01/11/2022. At the time of follow up on 10/6, she reported worsening pain especially when sitting despite increase in 5% increase in baclofen pump. She had been taking oral baclofen 20 mg  six/day, oxycodone 10 mg six/day and on Dantrolene 100 mg BID. She was admitted to inpatient rehab on 03/13/2022. Unable to participate in therapy therefore transferred to acute hospital bed.    Neurology, Dr. Cheral Marker was consulted. Diagnosed with acute toxic and metabolic encephalopathy from dehydration, UTI and medication excess, possible from cefepime administration. Felt to be pseudo-exacerbation and findings on MRI did not explain her symptoms. Dantrolene, Lamictal discontinued. Antibiotic changed to Zosyn and she completed 5 days of therapy. Palliative care consultation obtained on 11/3. They will continue to follow.    Hospital Course: Kennadee Walthour was admitted to rehab 04/03/2022 for inpatient therapies to consist of PT, ST and OT at least three hours five days a week. Past admission physiatrist,  therapy team and rehab RN have worked together to provide customized collaborative inpatient rehab. Nucynta changed to 75 mg every 6 hours. Bowel program: Nutrisource fiber, psyllium caps, Colace, MiraLax, Senekot-S given daily. Baclofen pump refill on 11/9. Trazodone added for insomnia. Pump up to 540 mcg/day on 11/10. DTI of coccyx but would not use air mattress. Neuropsychology consult on 11/13.  UA positive and started on Zosyn 11/14. Cultures positive for E. coli. Switched to cefazolin on 11/16. Tylenol increased to 1000 mg but max 3x/day on 11/17.New DTI on coccyx; however, patient refused air mattress. Frequent turning, skin care continued. Bilateral Prevalon boots in place. The patient and her husband discussed palliative care and possible SNF placement on 11/19. A palliative care consultation was performed on 11/21 and the patient elected to place DNR status and no feeding tube. Neurology, Dr. Leonel Ramsay, consulted and saw the patient same day. Solumedrol 1 gram daily from 11/21-11/25 initiated. MRI of brain and spinal cord obtained. Patient reported improvement in strength and ROM since starting steroid treatment, but also some confusion and visual disturbances with this therapy. Nucynta increased to 75 mg QID as needed on 11/27. UTI with VRE and given one dose on Monural 11/28. Diflucan 150 mg for yeast in urine and Keflex 500 mg TID for 5 days given per ID pharmacist. MBS completed on 11/28 and now on regular diet with thin liquids. Continue bowel program: Nutrisource fiber, psyllium caps, Colace, MiraLax, Senekot-S given daily. Cervical dystonia- may benefit from botulinum toxin injections 50U to Right trap and 50 U to Left SCM- potential risk is worsening of swallow from SCM injection.  Blood pressures were monitored on TID basis and remained overall stable. Transient low normal but asymptomatic.   Rehab course: During patient's stay in rehab weekly team conferences were held to monitor  patient's progress, set goals and discuss barriers to discharge. At admission, patient required max assist with basic self-care skills and mod assist with mobility. She presented with mild deficits in short term recall.  She has had improvement in activity tolerance, balance, postural control as well as ability to compensate for deficits. She has had improvement in functional use RUE/LUE  and RLE/LLE as well as improvement in awareness.  Pt straightens her UB clothing with Min A to manage zipper and transports herself to dayroom gym via w/c with SBA. Pt demonstrates good upright posture while transporting herself to gym. While seated in [place], pt retrieves bean bags from R side of body using the R UE, crosses midline, and places into basket located on L lower quadrant of pt. Pt able to retrieve and place total of 10 bean bags with increased time and effort. Pt requires verbal cues to initiate and correct posture during task. Pt noted to demonstrate  fatigue after ~6 bean bags demoing difficulty returning to upright positing and requires Min A to assist with posture. Per d/w pt and pt's husband, cognition now appears to be consistent with baseline with pt requiring Sup to Min A for basic + familiar cognitive tasks, with pt requiring up to Mod-Max with more unfamiliar, novel + complex tasks. Performance does fluctuate based on pt's fatigue/lethargy. Cognitive goals have been met at the [place] A level; do not anticipate pt being able to achieve a solid Sup or Mod I level given ongoing functional decline in the setting of MS exacerbation with minimal functional change noted during this admission    Discharge disposition: 03-Skilled Nursing Facility     Diet: Regular/thins  Special Instructions: No driving, alcohol consumption or tobacco use.  Routine F[place] care. Continue TEDs for bilateral lower extremity edema.  30-35 minutes were spent on discharge planning and discharge summary.  Discharge Instructions      Ambulatory referral to Neurology   Complete by: As directed    An appointment is requested in approximately: 4 weeks. Sees Dr. Felecia Shelling   Ambulatory referral to Physical Medicine Rehab   Complete by: As directed    Follow-up hopitalization   Discharge patient   Complete by: As directed    Discharge disposition: 03-Skilled [facility]   Discharge patient date: 05/04/2022      Allergies as of 05/04/2022       Reactions   Ziconotide Acetate Shortness Of Breath   Anorexia, weird sensations, could not talk, choking feeling, lost since of taste and smell. Hallucinations, vivid dreams. Confusion and sleepiness. N/v, increase in pain.  Anorexia, weird sensations, could not talk, choking feeling, lost since of taste and smell. Hallucinations, vivid dreams. Confusion and sleepiness. N/v, increase in pain.    Morphine Other (See Comments)   Pt does not recall reaction    Amoxicillin Nausea And Vomiting   Dantrolene Other (See Comments)   Muscle pain   Lamotrigine Other (See Comments)   Join pain   Lyrica [pregabalin] Nausea And Vomiting        Medication List     STOP taking these medications    albuterol 108 (90 Base) MCG/ACT inhaler Commonly known as: VENTOLIN HFA   amantadine 100 MG capsule Commonly known as: SYMMETREL   carboxymethylcellulose 0.5 % Soln Commonly known as: REFRESH PLUS   Green Tea 150 MG Caps   naproxen 500 MG tablet Commonly known as: NAPROSYN   Oxycodone HCl 10 MG Tabs   tiZANidine 4 MG tablet Commonly known as: ZANAFLEX   Vitamin B-12 2500 MCG Subl Replaced by: Cyanocobalamin 2500 MCG Tabs       TAKE these medications    acetaminophen 500 MG tablet Commonly known as: TYLENOL Take 2 tablets (1,000 mg total) by mouth 3 (three) times daily.   acidophilus Caps capsule Take 1 capsule by mouth 3 (three) times daily.   antiseptic oral rinse Liqd 15 mLs by Mouth Rinse route as needed for dry mouth.   baclofen 20 MG tablet Commonly  known as: LIORESAL Take 1 tablet (20 mg total) by mouth every 8 (eight) hours. What changed:  how much to take when to take this Another medication with the same name was removed. Continue taking this medication, and follow the directions you see here.   bisacodyl 10 MG suppository Commonly known as: DULCOLAX Place 1 suppository (10 mg total) rectally daily as needed for severe constipation.   buPROPion 300 MG 24 hr tablet Commonly known  as: WELLBUTRIN XL TAKE 1 TABLET DAILY   calcium carbonate 500 MG chewable tablet Commonly known as: TUMS - dosed in mg elemental calcium Chew 1 tablet (200 mg of elemental calcium total) by mouth 3 (three) times daily as needed for indigestion or heartburn.   conjugated estrogens vaginal cream Commonly known as: PREMARIN Place 1 Application vaginally 3 (three) times a week. Blueberry size amount for application   Cyanocobalamin 2500 MCG Tabs Take 2,500 mcg by mouth daily. Start taking on: May 05, 2022 Replaces: Vitamin B-12 2500 MCG Subl   dalfampridine 10 MG Tb12 Take 1 tablet (10 mg total) by mouth 2 (two) times daily. What changed: additional instructions   diazepam 5 MG tablet Commonly known as: VALIUM Take 1 tablet (5 mg total) by mouth every 8 (eight) hours.   docusate sodium 100 MG capsule Commonly known as: COLACE Take 1 capsule (100 mg total) by mouth at bedtime. What changed:  how much to take when to take this   fiber Pack packet Take 1 packet by mouth daily. Start taking on: May 05, 2022   meloxicam 15 MG tablet Commonly known as: MOBIC Take 1 tablet (15 mg total) by mouth daily. Start taking on: May 05, 2022   Metamucil 0.36 g Caps Generic drug: Psyllium Take 5 capsules by mouth daily. Start taking on: May 05, 2022   modafinil 100 MG tablet Commonly known as: PROVIGIL Take 1 tablet (100 mg total) by mouth daily. Start taking on: May 05, 2022   pantoprazole 40 MG tablet Commonly known as:  PROTONIX Take 1 tablet (40 mg total) by mouth 2 (two) times daily.   polyethylene glycol 17 g packet Commonly known as: MIRALAX / GLYCOLAX Take 17 g by mouth daily. Start taking on: May 05, 2022   polyvinyl alcohol 1.4 % ophthalmic solution Commonly known as: LIQUIFILM TEARS Place 1 drop into both eyes 3 (three) times daily as needed for dry eyes.   potassium chloride SA 20 MEQ tablet Commonly known as: KLOR-CON M Take 1 tablet (20 mEq total) by mouth every other day while taking Lasix   senna-docusate 8.6-50 MG tablet Commonly known as: Senokot-S Take 2 tablets by mouth daily. Start taking on: May 05, 2022 What changed: how much to take   tapentadol HCl 75 MG tablet Commonly known as: NUCYNTA Take 1 tablet (75 mg total) by mouth 4 (four) times daily -  before meals and at bedtime.   traZODone 100 MG tablet Commonly known as: DESYREL Take 1 tablet (100 mg total) by mouth at bedtime.   trospium 20 MG tablet Commonly known as: SANCTURA Take 1 tablet (20 mg total) by mouth 2 (two) times daily.   vitamin C 1000 MG tablet Take 1,000 mg by mouth daily.   Vitamin D-3 125 MCG (5000 UT) Tabs Take 5,000 Units by mouth daily.        Contact information for follow-up providers     Lovorn, Jinny Blossom, MD Follow up.   Specialty: Physical Medicine and Rehabilitation Why: office will call you to arrange your appt (sent) Contact information: [address] [place] [address] [phone]         Oren Section, NP-C Follow up.   Specialty: Family Medicine Why: Call in 1-2 days to make hosptial follow-up appointment Contact information: [address] [place] [address] [phone]         Britt Bottom, MD Follow up.   Specialty: Neurology Contact information: [phone]  [place] [address] [phone]              Contact information for after-discharge care     Destination     [facility]  Preferred SNF .   Service: Skilled Nursing Contact information: [address] [place] [phone]                     Signed: Barbie Banner 05/04/2022, 1:15 PM

## 2022-04-09 NOTE — Progress Notes (Signed)
Physical Therapy Session Note  Patient Details  Name: Sharon Odom MRN: [id number] Date of Birth: [birth year]  Today's Date: 04/09/2022 PT Individual Time: 1130-1200, 9833-8250 PT Individual Time Calculation (min): 30 min , 59 min   Short Term Goals: Week 1:  PT Short Term Goal 1 (Week 1): STG=LTG due ELOS  Skilled Therapeutic Interventions/Progress Updates:      Therapy Documentation Precautions:  Precautions Precautions: Fall Required Braces or Orthoses: Splint/Cast Splint/Cast: L resting hand splint Restrictions Weight Bearing Restrictions: No  Treatment Session 1:  Pt received seated in PWC at sink performing self-care tasks. Pt agreeable to PT session with emphasis on transfer training. Pt with unrated left ankle pain and provided with rest breaks for relief. Pt attempted sit to stand from [facility] with RW and unsuccessful despite max A from PT as pt with crouched posture and unable to activate posterior chain musculature to full upright position. Pt required max A from PT with sit to stand without AD. Pt requested to trial stedy and requires mod A for sit to stand pulling up with BUE's on stedy bars and CGA with sit to stand x 3 from stedy flaps. Pt left seated in PWC at bedside with all needs in reach.    Treatment Session 2:  Pt received seated in PWC and agreeable to PT session with emphasis on community integration, transfer training, and strengthening. Pt navigated PWC with supervision for safety to atrium outside [facility] atrium for community integration and to increase mood as pt has been tearful throughout most PT sessions. Pt performed seated resisted hip abduction with yellow theraband 3 x 10 with verbal cues for form. Pt reported buttock pain and performed sit to stand with max A from PT to reposition in chair and pt reported decreased discomfort. Pt transitioned to right LE knee extension exercises 3 x 10. Pt tearful at end of session and reports this "sucks". PT provided comfort  and discussed with patient regarding stages of grieving in terms of functional decline. Pt left seated at bedside with all needs in reach.    04/09/22 1546  RLE Tone  RLE Tone Modified Ashworth  Body Part - Modified Ashworth Scale Hamstrings;Gastrocnemius  Hamstrings - Modified Ashworth Scale for Grading Hypertonia RLE 1  GASTROCNEMIUS - Modified Ashworth Scale for Grading Hypertonia RLE 3    04/09/22 1547  LLE Tone  LLE Tone Modified Ashworth  Body Part - Modified Ashworth Scale Hamstrings;Gastrocnemius  Hamstrings - Modified Ashworth Scale for Grading Hypertonia LLE 0  Gastrocnemius - Modified Ashworth Scale for Grading Hypertonia LLE 2     Therapy/Group: Individual Therapy  [name] [name] PT, DPT  04/09/2022, 7:43 AM

## 2022-04-09 NOTE — Consult Note (Signed)
Neuropsychological Consultation   Patient:   Sharon Odom   DOB:   1971/02/22  MR Number:  [id number]  Location:  [facility] [address] CENTER A [id number] [place] [address] Dept: [phone] Loc: [phone]           Date of Service:   04/09/2022  Start Time:   8 AM End Time:   9 AM  Provider/Observer:  Arley Phenix, Psy.D.       Clinical Neuropsychologist       Billing Code/Service: 96158/96159  Chief Complaint:    Sharon Odom is a 51 year old female referred for neuropsychological consultation due to coping and adjustment issues with history of progressive MS.  Anxiety and depressive symptoms have been present with significant functional decline over several years.  Patient has had significant left greater than right spasticity with subsequent implantation of baclofen pump.  Patient underwent revision of pump on 01/11/2022.  Patient reported on 03/02/2022 worsening pain despite increase of 5% and baclofen pump.  Patient is also taking oral baclofen, oxycodone.  Patient has significant hypophonia, neurogenic bladder.  Patient has limited mobility and uses a power wheelchair.  Patient continues to take Wellbutrin for depressive symptomatology and has been followed by Dr. Berline Chough in the outpatient clinic as well.  Patient transferred off CIR to Acute Care on 03/27/2022 due to infection with Neuro consult due to new lesions found on MRI and Solu-medrol started.  Reason for Service:  Patient referred for neuropsychological consultation due to coping and adjustment issues with significant anxiety and depressive symptoms.  Below is the HPI for the current admission.  HPI: Sharon Odom is a 51 year old female with a history of secondary progressive multiple sclerosis. She has had significant left greater than right spasticity with subsequent implantation of intrathecal pump for baclofen administration. She underwent  revision of the abdominal portion of the pump by Dr. Jake Samples on 01/11/2022. At the time of follow up on 10/6, she reported worsening pain especially when sitting despite increase in 5% increase in baclofen pump. She has been taking oral baclofen 20 mg six/day, oxycodone 10 mg six/day and on Dantrolene 100 mg BID. Continues on bowel regimen of Miralax, Metamucil and Senekot.  She has significant hypophonia, neurogenic bladder with indwelling Foley catheter. She is tolerating regular diet with thin liquids and taking medications crushed in applesauce.  She had limited mobility and uses a power wheelchair. Sleeps in chair or on couch. She can drive WC into handicap shower and transfers to shower bench. She uses a portable BSC transfers for bowel program. She has a vertical power lift in her garage to from from power WC to car. She uses a manual WC in the community for appointments. He husband lifts her onto couch, into car, etc as she has not stood for many months. The patient requires inpatient physical medicine and rehabilitation evaluations and treatment secondary to dysfunction due to secondary progressive multiple sclerosis.   She follows with Dr. Despina Arias, [facility]. Last [place] in 2021.   Pt reports spasticity has been getting worse- 6 months ago, her strength was much better- was able to transfer in/out of a shower; walk ~ 20-30 ft with RW, and transfer without help- not needs almost max-total A to transfer; hasn't been able to get in shower, needs foley for toileting.  Had been put on nectar thick liquids when in hospital last- stopped, because swallowing "felt better" and hated them.  Current Status:  Patient finishing up breakfast and struggling with fine motor control and continued struggles with volume and articulation issues with speech.  Patient reports more issues with right leg and foot pain/spasms.    Behavioral Observation: Sharon Odom  presents as a 51  y.o.-year-old Right handed Caucasian Female who appeared her stated age. her dress was Appropriate and she was Well Groomed and her manners were Appropriate to the situation.  her participation was indicative of Appropriate and Redirectable behaviors.  There were physical disabilities noted.  she displayed an appropriate level of cooperation and motivation.     Interactions:    Active Appropriate  Attention:   within normal limits and attention span and concentration were age appropriate  Memory:   within normal limits; recent and remote memory intact  Visuo-spatial:  not examined  Speech (Volume):  low  Speech:   normal; slurred  Thought Process:  Coherent and Relevant  Though Content:  WNL; not suicidal and not homicidal  Orientation:   person, place, time/date, and situation  Judgment:   Good  Planning:   Fair  Affect:    Anxious, Depressed, and Tearful  Mood:    Dysphoric  Insight:   Good  Intelligence:   high   Medical History:   Past Medical History:  Diagnosis Date   Allergies    Anxiety    Arthritis    Asthma    seasonal - pollen   Constipation    Depression    Dyspnea    with exertion   GERD (gastroesophageal reflux disease)    Headache    History of hiatal hernia    Left radial nerve palsy 03/15/2020   resolved - Left arm/hand weak   Multiple sclerosis (HCC)    Pneumonia 2007   x 1   Vision abnormalities    Wears glasses          Patient Active Problem List   Diagnosis Date Noted   Pressure injury of skin 04/03/2022   Hypokalemia 03/29/2022   Normocytic anemia 03/28/2022   UTI (urinary tract infection) with pyuria 03/27/2022   S/P insertion of intrathecal pump 03/13/2022   Neurogenic bladder 12/29/2021   Urinary retention 12/29/2021   Thrombocytopenia (HCC) 11/08/2021   Spasticity 09/28/2021   Spastic hemiparesis of left nondominant side due to non-cerebrovascular etiology (HCC) 07/17/2021   Relapsing remitting multiple sclerosis (HCC)  07/17/2021   Transient hypotension 06/18/2021   Mild intermittent asthma 06/08/2021   Urinary incontinence 06/08/2021   Wound infection after surgery 04/06/2021   Mechanical complication of device 12/21/2020   Wheelchair dependence 12/16/2020   Left arm weakness 07/15/2020   Left radial nerve palsy 03/15/2020   Hoarseness 12/14/2019   Dysarthria 12/14/2019   Dysesthesia 09/03/2019   Low serum vitamin B12 06/08/2019   Low serum vitamin D 06/08/2019   Vitamin deficiency related neuropathy (HCC) 06/08/2019   Multiple sclerosis (HCC) 05/13/2019   Steroid-induced hyperglycemia    Hypoalbuminemia due to protein-calorie malnutrition (HCC)    Neurogenic bowel    Asthma 04/02/2019   Constipated 04/02/2019   Spell of generalized weakness 03/31/2019   Presence of intrathecal baclofen pump 03/19/2019   Dysphagia 04/14/2018   Spastic diplegia, acquired, lower extremity (HCC) 10/16/2017   Acute metabolic encephalopathy 08/15/2017   Gait disturbance 08/15/2017   Depression with anxiety 08/15/2017   Diplopia 08/15/2017   Failure to thrive in adult 03/04/2017   High risk medication use 02/06/2017    Psychiatric History:  Patient with depressive and  anxiety type symptoms going back several years but none prior to the development of significant impact of her multiple sclerosis.  Family Med/Psych History:  Family History  Problem Relation Age of Onset   Diabetes Mother    Healthy Brother    Impression/DX:  Sharon Odom is a 51 year old female referred for neuropsychological consultation due to coping and adjustment issues with history of progressive MS.  Anxiety and depressive symptoms have been present with significant functional decline over several years.  Patient has had significant left greater than right spasticity with subsequent implantation of baclofen pump.  Patient underwent revision of pump on 01/11/2022.  Patient reported on 03/02/2022 worsening pain despite increase of 5% and  baclofen pump.  Patient is also taking oral baclofen, oxycodone.  Patient has significant hypophonia, neurogenic bladder.  Patient has limited mobility and uses a power wheelchair.  Patient continues to take Wellbutrin for depressive symptomatology and has been followed by Dr. Berline Chough in the outpatient clinic as well.  Patient transferred off CIR to Acute Care on 03/27/2022 due to infection with Neuro consult due to new lesions found on MRI and Solu-medrol started  Patient finishing up breakfast and struggling with fine motor control and continued struggles with volume and articulation issues with speech.  Patient reports more issues with right leg and foot pain/spasms.   Patient more emotional and distressed today with crying spell and deep concerns about loss of function.         Electronically Signed   _______________________ Arley Phenix, Psy.D. Clinical Neuropsychologist

## 2022-04-10 LAB — URINALYSIS, ROUTINE W REFLEX MICROSCOPIC
Bilirubin Urine: NEGATIVE
Glucose, UA: NEGATIVE mg/dL
Hgb urine dipstick: NEGATIVE
Ketones, ur: NEGATIVE mg/dL
Nitrite: POSITIVE — AB
Protein, ur: 30 mg/dL — AB
RBC / HPF: >50 RBC/hpf — ABNORMAL HIGH (ref 0–5)
Specific Gravity, Urine: 1.02 (ref 1.005–1.030)
WBC, UA: >50 WBC/hpf — ABNORMAL HIGH (ref 0–5)
pH: 5 (ref 5.0–8.0)

## 2022-04-10 MED ORDER — PIPERACILLIN-TAZOBACTAM 3.375 G IVPB
3.3750 g | Freq: Three times a day (TID) | INTRAVENOUS | Status: DC
  Administered 2022-04-10 – 2022-04-12 (×5): 3.375 g via INTRAVENOUS
  Filled 2022-04-10 (×7): qty 50

## 2022-04-10 MED ORDER — TRAZODONE HCL 50 MG PO TABS
100.0000 mg | ORAL_TABLET | Freq: Every day | ORAL | Status: DC
  Administered 2022-04-10 – 2022-05-03 (×24): 100 mg via ORAL
  Filled 2022-04-10 (×24): qty 2

## 2022-04-10 MED ORDER — BACLOFEN 10 MG PO TABS
20.0000 mg | ORAL_TABLET | Freq: Three times a day (TID) | ORAL | Status: DC
  Administered 2022-04-10 – 2022-05-04 (×73): 20 mg via ORAL
  Filled 2022-04-10 (×73): qty 2

## 2022-04-10 MED ORDER — BACLOFEN 10 MG PO TABS: 20.0000 mg | ORAL_TABLET | Freq: Two times a day (BID) | ORAL | Status: DC

## 2022-04-10 MED ORDER — BACLOFEN 10 MG PO TABS: 20.0000 mg | ORAL_TABLET | Freq: Every day | ORAL | Status: DC

## 2022-04-10 NOTE — Progress Notes (Signed)
Patient ID: Sharon Odom, female   DOB: 01/16/1971, 51 y.o.   MRN: [id number]  Met with pt regarding team conference goals of min-mod level and target discharge date 11/28. Pt was upset due to she will miss Thanksgiving. Discussed making sure she was at her best before going home and the issues she is having right now-STM and confusion. She agreed and is aware MD to come in and talk with her at 1:30 after speech with her husband. Will continue to work on discharge, pt also upset regarding feeling her iphone was hacked.

## 2022-04-10 NOTE — Progress Notes (Signed)
Physical Therapy Session Note  Patient Details  Name: Sharon Odom MRN: [id number] Date of Birth: 22-Nov-1970  Today's Date: 04/10/2022 PT Individual Time: 0962-8366 PT Individual Time Calculation (min): 60 min   Short Term Goals: Week 1:  PT Short Term Goal 1 (Week 1): STG=LTG due ELOS  Skilled Therapeutic Interventions/Progress Updates:      Therapy Documentation Precautions:  Precautions Precautions: Fall Required Braces or Orthoses: Splint/Cast Splint/Cast: L resting hand splint Restrictions Weight Bearing Restrictions: No   Pt received seated in PWC at bedside and agreeable to PT session with an emphasis on LE strengthening. Pt with unrated hip left hip pain on Nustep and provided with repositioning for relief. Pt navigated PWC with supervision to dayroom and required total A from PT for w/c navigation for kinetron set-up. Pt performed kinetron with R LE x 6 min at 90 cm/sec. Pt with decreased strength and unable to perform with L LE. Pt required +2 for squat pivot transfer from PWC to NuStep seat. Pt unable to tolerate NuStep due to onset of left hip pain and pt returned to [facility]. Pt navigated to room with supervision for safety and participated in exercise of R LE knee extension 3 x 10 and resisted red theraband knee extension 3 x 10. Pt also performed yellow theraband hip abduction 3 x 10. Pt left with all needs in reach.      Therapy/Group: Individual Therapy  [name] [name] PT, DPT  04/10/2022, 7:49 AM

## 2022-04-10 NOTE — Progress Notes (Signed)
Physical Therapy Session Note  Patient Details  Name: Sharon Odom MRN: [id number] Date of Birth: Aug 09, 1970  Today's Date: 04/10/2022 PT Missed Time:   Missed Time Reason: Nursing care  Short Term Goals: Week 1:  PT Short Term Goal 1 (Week 1): STG=LTG due ELOS  Skilled Therapeutic Interventions/Progress Updates:      Therapy Documentation Precautions:  Precautions Precautions: Fall Required Braces or Orthoses: Splint/Cast Splint/Cast: L resting hand splint Restrictions Weight Bearing Restrictions: No General: PT Missed Treatment Reason: Nursing care  Pt missed 30 minutes of skilled PT due to foley exchange.     Therapy/Group: Individual Therapy  [name] [name] PT, DPT  04/10/2022, 3:38 PM

## 2022-04-10 NOTE — Progress Notes (Signed)
UA results reviewed.  Urinalysis    Component Value Date/Time   COLORURINE YELLOW 04/10/2022 1500   APPEARANCEUR CLOUDY (A) 04/10/2022 1500   APPEARANCEUR Clear 11/11/2019 1559   LABSPEC 1.020 04/10/2022 1500   PHURINE 5.0 04/10/2022 1500   GLUCOSEU NEGATIVE 04/10/2022 1500   HGBUR NEGATIVE 04/10/2022 1500   BILIRUBINUR NEGATIVE 04/10/2022 1500   BILIRUBINUR Negative 11/11/2019 1559   KETONESUR NEGATIVE 04/10/2022 1500   PROTEINUR 30 (A) 04/10/2022 1500   NITRITE POSITIVE (A) 04/10/2022 1500   LEUKOCYTESUR LARGE (A) 04/10/2022 1500   Will start zosyn per Dr. Berline Chough. Follow-up culture results.

## 2022-04-10 NOTE — Progress Notes (Signed)
Occupational Therapy Session Note  Patient Details  Name: Sharon Odom MRN: [id number] Date of Birth: 03/09/1971  Today's Date: 04/10/2022 OT Individual Time: 0800-0900 OT Individual Time Calculation (min): 60 min    Short Term Goals: Week 1:  OT Short Term Goal 1 (Week 1): STG=LTG d/t ELOS  Skilled Therapeutic Interventions/Progress Updates:   Pt bed level upon OT arrival for am session. Pt able to move from supine to sit EOB with S using leg lifter and rails. Sit to stand with mod A to STEDY for accessing shower on 3 in 1 commode then to power w/c. Improved postural control in sitting throughout bathing. Mod a LB bathing and dressing including threading Foley with increased time and effort. Improved reach to buttocks with forward leaning. UB bathing with set up and once back in power w/c dressing with occ min A d/t fasteners otherwise set up for pullover garments. Grooming including hair drying with set up. Pt with overall slowed processing, speech and motor output requiring S and increased time and repetition. Left pt up in power wc sink side with NT [name] to complete set up of supplies, call button and needs.    Therapy Documentation Precautions:  Precautions Precautions: Fall Required Braces or Orthoses: Splint/Cast Splint/Cast: L resting hand splint Restrictions Weight Bearing Restrictions: No   Therapy/Group: Individual Therapy  Vicenta Dunning 04/10/2022, 7:53 AM

## 2022-04-10 NOTE — Patient Care Conference (Signed)
Inpatient RehabilitationTeam Conference and Plan of Care Update Date: 04/10/2022   Time: 11:44 AM    Patient Name: Sharon Odom      Medical Record Number: [id number]  Date of Birth: December 09, 1970 Sex: Female         Room/Bed: 4W09C/4W09C-01 Payor Info: Payor: BLUE CROSS BLUE SHIELD / Plan: BCBS OTHER / Product Type: *No Product type* /    Admit Date/Time:  04/03/2022  4:29 PM  Primary Diagnosis:  Multiple sclerosis [facility])  Hospital Problems: Principal Problem:   Multiple sclerosis (HCC) Active Problems:   Spasticity   Pressure injury of skin    Expected Discharge Date: Expected Discharge Date: 04/24/22  Team Members Present: Physician leading conference: Dr. Genice Rouge Social Worker Present: Dossie Der, LCSW Nurse Present: Vedia Pereyra, RN PT Present: [name], PT OT Present: Valetta Fuller, OT SLP Present: Sarita Bottom, SLP PPS Coordinator present : Fae Pippin, SLP     Current Status/Progress Goal Weekly Team Focus  Bowel/Bladder    Foley from home with bowel program every 2 weeks     Min assist with foley care and bowel program   Educate on foley care at home and importance of bowel program   Swallow/Nutrition/ Hydration               ADL's   Mod A LB self care, min A UB self care, Mod A squat pivot transfer to 3 in 1   S transfers, min A toileting HHOT rec and has all DME already   Transfer 3 in 1, LB and toileting    Mobility   mod bed mobility, max A sit to stand with PT and mod A from stedy, max A squat pivot and unable to perform stand pivot   min A  bed mobility, transfers, pt/fam education    Communication                Safety/Cognition/ Behavioral Observations  Mod A, Max A recall, SLUMS 19/30 reduced sustained attention/memory   Min A - downgraded on 11/13   basic problem solving, emergent awareness, recall with aids and sustained attention    Pain    Uncontrolled pain    Pain managed 4 out of 10 on pain scale with pain  medications  Assess every 4 hours and prior to therapy   Skin    DTI to left posterior heel/ankle. DTI to coccyx and wound to right thigh    Wounds will improve during stay in rehab   Assess skin every shift and prn      Discharge Planning:  Home with husband who does work during the day-have encouraged her to hire assist since team recommends 24/7 care at DC   Team Discussion: MS. Neurogenic B/B. Foley from home. Incontinent of bowel. Pain has been increased recently. Medication adjustments made. Patient seems to be more confused from prior admission. Dressings to wounds per orders. Therapy noted bump to head that is new. May get CT of head. Mod to total assist for ADLs. Speech, Mod to max with SLUMS 19/30.  Patient on target to meet rehab goals: no, speech to downgrade goals to min A  *See Care Plan and progress notes for long and short-term goals.   Revisions to Treatment Plan:  Medication adjustments, monitor labs, monitor VS  Teaching Needs: Medications, foley care, bowel program, transfer training, skin/wound care, etc  Current Barriers to Discharge: Decreased caregiver support, Incontinence, Neurogenic bowel and bladder, Wound care, Medication compliance, and Behavior  Possible  Resolutions to Barriers: Family education, nursing education, order recommended DME     Medical Summary Current Status: has foley- wound R posterior thigh- and coccyx stage II- more confused-  Barriers to Discharge: Behavior/Mood;Complicated Wound;Incontinence;Neurogenic Bowel & Bladder;Medical stability;Uncontrolled Pain;Spasticity;Self-care education;Hypotension  Barriers to Discharge Comments: encourage her to hire someone to help at home- she's concerned about cost-not safe at home alone- Possible Resolutions to [employer] Focus: mod A bed/max A sit-stand; max A squat pivot- has been able to stand with RW at all last few days-heels resolved- coccyx looking better and R heel resolved- goals  min A transfers- w/c- mod-max A- 19/30 SLUMS- done yesterday afternoon- downgraded SLP goals to min A- gotten worse since Sunday- might get head CT due to bump on head?-  will d/w husband- 11/28- and will see if can improve-   Continued Need for Acute Rehabilitation Level of Care: The patient requires daily medical management by a physician with specialized training in physical medicine and rehabilitation for the following reasons: Direction of a multidisciplinary physical rehabilitation program to maximize functional independence : Yes Medical management of patient stability for increased activity during participation in an intensive rehabilitation regime.: Yes Analysis of laboratory values and/or radiology reports with any subsequent need for medication adjustment and/or medical intervention. : Yes   I attest that I was present, lead the team conference, and concur with the assessment and plan of the team.   Ernest Pine 04/10/2022, 5:04 PM

## 2022-04-10 NOTE — Progress Notes (Addendum)
PROGRESS NOTE   Subjective/Complaints:  Pt reports has felt more confused, more forgetful last 2 days or so-  Also sweating a lot at night.   ROS:   Pt denies SOB, abd pain, CP, N/V/C/D, and vision changes  Except for HPI  Objective:   No results found. Recent Labs    04/09/22 0455  WBC 8.0  HGB 10.3*  HCT 31.7*  PLT 277   Recent Labs    04/09/22 0455  NA 141  K 4.3  CL 105  CO2 29  GLUCOSE 94  BUN 17  CREATININE 0.79  CALCIUM 9.4    Intake/Output Summary (Last 24 hours) at 04/10/2022 1004 Last data filed at 04/10/2022 0855 Gross per 24 hour  Intake 936 ml  Output 1175 ml  Net -239 ml     Pressure Injury 03/31/22 Sacrum Medial Deep Tissue Pressure Injury - Purple or maroon localized area of discolored intact skin or blood-filled blister due to damage of underlying soft tissue from pressure and/or shear. (Active)  03/31/22 0815  Location: Sacrum  Location Orientation: Medial  Staging: Deep Tissue Pressure Injury - Purple or maroon localized area of discolored intact skin or blood-filled blister due to damage of underlying soft tissue from pressure and/or shear.  Wound Description (Comments):   Present on Admission: Yes     Pressure Injury 04/03/22 Thigh Left;Posterior;Proximal Stage 2 -  Partial thickness loss of dermis presenting as a shallow open injury with a red, pink wound bed without slough. red pink (Active)  04/03/22 1751  Location: Thigh  Location Orientation: Left;Posterior;Proximal  Staging: Stage 2 -  Partial thickness loss of dermis presenting as a shallow open injury with a red, pink wound bed without slough.  Wound Description (Comments): red pink  Present on Admission: Yes     Pressure Injury 04/03/22 Ankle Left;Posterior Deep Tissue Pressure Injury - Purple or maroon localized area of discolored intact skin or blood-filled blister due to damage of underlying soft tissue from  pressure and/or shear. 1x1 (Active)  04/03/22 1752  Location: Ankle  Location Orientation: Left;Posterior  Staging: Deep Tissue Pressure Injury - Purple or maroon localized area of discolored intact skin or blood-filled blister due to damage of underlying soft tissue from pressure and/or shear.  Wound Description (Comments): 1x1  Present on Admission: Yes    Physical Exam: Vital Signs Blood pressure (!) 90/54, pulse 72, temperature 98.1 F (36.7 C), temperature source Oral, resp. rate 16, height 5\' 9"  (1.753 m), weight 56.2 kg, SpO2 95 %.     General: awake, alert, appropriate, sitting up in bed eating breakfast; NAD HENT: conjugate gaze; oropharynx moist CV: regular rate; no JVD Pulmonary: CTA B/L; no W/R/R- good air movement GI: soft, NT, ND, (+)BS- ITB pump on R side Psychiatric: appropriate Neurological: poor STM noted- couldn't remember discussion yesterday MSK no pain with LE ROM at knee and ankle    LLE- back to baseline  Skin:    General: Skin is warm and dry.     Comments: New DTI on coccyx  Also worsening skin open/pressure Stage II on posterior thigh- on R thigh Also small DTI on R heel  Neurological:  Mental Status: She is alert.     Comments: MAS 2 to 3 RLE, MAS 1+ LLE  Motor 5/5 RUE 3+ LUE 2- RLE 1/5 LLE     Assessment/Plan: 1. Functional deficits which require 3+ hours per day of interdisciplinary therapy in a comprehensive inpatient rehab setting. Physiatrist is providing close team supervision and 24 hour management of active medical problems listed below. Physiatrist and rehab team continue to assess barriers to discharge/monitor patient progress toward functional and medical goals  Care Tool:  Bathing    Body parts bathed by patient: Left arm, Chest, Abdomen, Front perineal area, Buttocks, Right upper leg, Left upper leg, Face, Right arm   Body parts bathed by helper: Right lower leg, Left lower leg     Bathing assist Assist Level:  Moderate Assistance - Patient 50 - 74%     Upper Body Dressing/Undressing Upper body dressing   What is the patient wearing?: Pull over shirt, Bra    Upper body assist Assist Level: Minimal Assistance - Patient > 75%    Lower Body Dressing/Undressing Lower body dressing      What is the patient wearing?: Underwear/pull up, Pants     Lower body assist Assist for lower body dressing: Moderate Assistance - Patient 50 - 74%     Toileting Toileting Toileting Activity did not occur (Clothing management and hygiene only): N/A (no void or bm)  Toileting assist Assist for toileting: Moderate Assistance - Patient 50 - 74%     Transfers Chair/bed transfer  Transfers assist     Chair/bed transfer assist level: Maximal Assistance - Patient 25 - 49%     Locomotion Ambulation   Ambulation assist   Ambulation activity did not occur: Safety/medical concerns (unable to perform due to pain, weakness and decreased activity tolerance)          Walk 10 feet activity   Assist  Walk 10 feet activity did not occur: Safety/medical concerns (unable to perform due to pain, weakness and decreased activity tolerance)        Walk 50 feet activity   Assist Walk 50 feet with 2 turns activity did not occur: Safety/medical concerns (unable to perform due to pain, weakness and decreased activity tolerance)         Walk 150 feet activity   Assist Walk 150 feet activity did not occur: Safety/medical concerns (unable to perform due to pain, weakness and decreased activity tolerance)         Walk 10 feet on uneven surface  activity   Assist Walk 10 feet on uneven surfaces activity did not occur: Safety/medical concerns (unable to perform due to pain, weakness and decreased activity tolerance)         Wheelchair     Assist Is the patient using a wheelchair?: Yes Type of Wheelchair: Power    Wheelchair assist level: Supervision/Verbal cueing Max wheelchair  distance: 300 ft    Wheelchair 50 feet with 2 turns activity    Assist        Assist Level: Supervision/Verbal cueing   Wheelchair 150 feet activity     Assist      Assist Level: Supervision/Verbal cueing   Blood pressure (!) 90/54, pulse 72, temperature 98.1 F (36.7 C), temperature source Oral, resp. rate 16, height  (1.753 m), weight 56.2 kg, SpO2 95 %.  Medical Problem List and Plan: 1. Functional deficits due to secondary progressive multiple sclerosis and debility from prolonged admission on acute floor for encephalopathy             -  patient may  shower             -ELOS/Goals: min A to supervision- 7-10 days  Con't CIR- PT, OT, and SLP  Team conference today to determine length of stay 2.  Antithrombotics: -DVT/anticoagulation:  Pharmaceutical: Lovenox             -antiplatelet therapy: none 3. Pain Management:              -Naproxen 500 mg twice daily             -Tylenol as needed        will change to Nucynta 75 mg q6 hours - will not add Methadone due to chance of polypharmacy   11/14- con't Nucynta- since change didn't increase dose went from 50 mg q4 to 75 mg q6 hours prn-did decrease Am baclofen to 20 mg- con't other doses at 20 mg and reduced doses of zanaflex-tomorrow, if no other issues, will reduce Valium to 2.5 mg q8 hours   4. Mood/Behavior/Sleep: LCSW to evaluate and provide emotional support             -antipsychotic agents: n/a             -Wellbutrin XL 300 mg daily             -amantadine 100 mg TID- per Dr Epimenio Foot 5. Neuropsych/cognition: This patient is capable of making decisions on her own behalf. 6. Skin/Wound Care: Routine skin care checks  11/7- has new DTI on coccyx, worsening now stage II on posterior R thigh and small DTI R heel- added prevalon boots in bed; pt will NOT use air mattress- changing to regular bed due to pt insistence; and foam dressing and turn q2 hours.   11/9- pt doesn't like prevalon boots, but educated on the  importance 7. Fluids/Electrolytes/Nutrition: Routine Is and Os and follow-up chemistries             -continue Vitamin C, D3, B12 and fiber supplements 8: MS with spasticity: continue dalfampridine BID and Sanctura              -Baclofen 20 mg twice daily, 40 mg every morning             -Valium 5 mg every 8 hours             -Zanaflex 4 mg twice daily- were reduced due to cognition/sedation on acute- will change out /refill pump Thursday as well as increase dosing of ITB pump  11/8- spasticity slightly more than normal, however increasing ITB pump tomorrow, so will not increase spasticity PO meds  11/9- increasing ITB pump today- and refilling it.   11/10- pump up to 540 mcg/day  11/14- wait on changing dose- discussed with pt x2 about if we increase ITB PUMP, will help spasticity pain, however will make her functionally weaker. Will d/w pt and husband again as well 9: GERD: restart Zantac 10: Neurogenic bladder with indwelling Foley; s/p UTI treatment with Zosyn 11: Bowel program: Nutrisource fiber, psyllium caps, Colace, MiraLax, Senekot-S given daily             -placed PRN sorbitol and Fleets orders- has gotten really constipated/needing multiple enemas-   11/9- will increase miralax to BID x 3 days since increasing pump- no BM since admission to rehab 11/7  11/14- LBM x2 on 11/12- if no BM by tomorrow, will do Sorbitol and possibly SSE 12: Dysphagia: ? resolved; now on regular diet; SLP eval 13:  Seasonal allergies off home meds: monitor 14: Tooth abscess: missed follow-up appointment; completed Flagyl 15: Hypokalemia: on K+ supplement QOD; follow-up BMP tomorrow- likely due to previous Lasix dose for Edema.    11/8- K+ 4.1  11/14- K+ 4.3 16. Insomnia  11/9- will add Trazodone 50 mg QHS and can titrate up as required  11/10- increase to 150 mg QHS  11/14- will decrease to 100 mg QHS since having some confusion 17. Increasing confusion/STM memory, recall  11/14- pt asking [place] to check  U/A And Cx- since feels getting a UTI- BP on low side as well- asymptomatic per pt   I spent a total of  52  minutes on total care today- >50% coordination of care- due to d/w team about pt; as well as team conference and d/w pt and husband about spasticity/pain control.    LOS: 7 days A FACE TO FACE EVALUATION WAS PERFORMED  Reign Bartnick 04/10/2022, 10:04 AM

## 2022-04-10 NOTE — Progress Notes (Signed)
Speech Language Pathology Daily Session Note  Patient Details  Name: Sharon Odom MRN: [id number] Date of Birth: Jun 26, 1970  Today's Date: 04/10/2022 SLP Individual Time: 1771-1657 SLP Individual Time Calculation (min): 65 min  Short Term Goals: Week 2: SLP Short Term Goal 1 (Week 2): Pt will demonstrate sustained attention in 3-5 minute intervals with supervision A direction. SLP Short Term Goal 2 (Week 2): Pt will demonstrate recall of daily information with min A verbal cues for external aids. SLP Short Term Goal 3 (Week 2): Pt will complete basic problem solving skills with min A verbal cues. SLP Short Term Goal 4 (Week 2): Pt will self-monitor and self-correct functional errors with min A verbal cues.  Skilled Therapeutic Interventions:Skilled ST services focused on cognitive skills. SLP facilitated initiation of memory notebook and use of memory strategies WRAP. Pt was able to recall general events of the day required cuing for specific details. Pt recalled WRAP strategies with min A verbal cues for use of memory notebook. Pt utilized visualization/picture it from [employer] when recalling details from picture scenes, pt demonstrated 70% accuracy increasing to 80% with verbal cues. Pt demonstrated sustained attention in 5 minute intervals with supervision A redirection cues. Pt was left in room with call bell within reach and chair alarm set. SLP recommends to continue skilled services.     Pain Pain Assessment Pain Score: 0-No pain  Therapy/Group: Individual Therapy  Sharon Odom  [facility] 04/10/2022, 1:37 PM

## 2022-04-11 NOTE — Progress Notes (Signed)
Physical Therapy Session Note  Patient Details  Name: Sharon Odom MRN: [id number] Date of Birth: Mar 23, 1971  Today's Date: 04/11/2022 PT Individual Time: 0805-0850 PT Individual Time Calculation (min): 45 min   Short Term Goals: Week 1:  PT Short Term Goal 1 (Week 1): STG=LTG due ELOS  Skilled Therapeutic Interventions/Progress Updates:     Patient in bed finishing breakfast upon PT arrival. Patient alert and agreeable to PT session. Patient reported 8/10 buttock pain during session, RN premedicated patient just prior to session. PT provided repositioning, rest breaks, and distraction as pain interventions throughout session.   Patient requested to wash her face and brush her teeth bed level prior to mobility. Educated on energy conservation strategies and compensatory strategies for using only R upper extremity for bed level ADLs. Donned pants with total A bed level performing rolling L with min A and R with max A with use of bed rails and cuing for sequencing. Noted chronic, healing stage 3 pressure wound to L posterior thigh and un-stagable pressure would to sacrum, charge nurse made aware and measured and applied dressings to wounds. Educated patient on pressure relief strategies, rolling to the side and alternating sides every 2 hours, per MD notes, performing pressure relief in her power w/c every 30 min in sitting , and informing nursing of incontinence immediately to reduce risk of skin breakdown. Patient receptive and able to teach back education following.   Patient required increased time for initiation, cuing, rest breaks, and for completion of tasks throughout session. Utilized therapeutic use of self throughout to promote efficiency.   Patient in bed at end of session with breaks locked, bed alarm set, and all needs within reach.   Therapy Documentation Precautions:  Precautions Precautions: Fall Required Braces or Orthoses: Splint/Cast Splint/Cast: L resting hand  splint Restrictions Weight Bearing Restrictions: No    Therapy/Group: Individual Therapy  Travares Nelles L Aadam Zhen PT, DPT, NCS, CBIS  04/11/2022, 1:02 PM

## 2022-04-11 NOTE — Progress Notes (Signed)
PROGRESS NOTE   Subjective/Complaints:  Still sweating a lot- confusion somewhat better per pt since started IV Zosyn yesterday late.  ROS:   Pt denies SOB, abd pain, CP, N/V/C/D, and vision changes  Except for HPI  Objective:   No results found. Recent Labs    04/09/22 0455  WBC 8.0  HGB 10.3*  HCT 31.7*  PLT 277   Recent Labs    04/09/22 0455  NA 141  K 4.3  CL 105  CO2 29  GLUCOSE 94  BUN 17  CREATININE 0.79  CALCIUM 9.4    Intake/Output Summary (Last 24 hours) at 04/11/2022 1756 Last data filed at 04/11/2022 1632 Gross per 24 hour  Intake 596 ml  Output 1200 ml  Net -604 ml     Pressure Injury 03/31/22 Sacrum Medial Deep Tissue Pressure Injury - Purple or maroon localized area of discolored intact skin or blood-filled blister due to damage of underlying soft tissue from pressure and/or shear. (Active)  03/31/22 0815  Location: Sacrum  Location Orientation: Medial  Staging: Deep Tissue Pressure Injury - Purple or maroon localized area of discolored intact skin or blood-filled blister due to damage of underlying soft tissue from pressure and/or shear.  Wound Description (Comments):   Present on Admission: Yes     Pressure Injury 04/03/22 Thigh Left;Posterior;Proximal Stage 2 -  Partial thickness loss of dermis presenting as a shallow open injury with a red, pink wound bed without slough. red pink (Active)  04/03/22 1751  Location: Thigh  Location Orientation: Left;Posterior;Proximal  Staging: Stage 2 -  Partial thickness loss of dermis presenting as a shallow open injury with a red, pink wound bed without slough.  Wound Description (Comments): red pink  Present on Admission: Yes    Physical Exam: Vital Signs Blood pressure 105/63, pulse 67, temperature 98.2 F (36.8 C), temperature source Oral, resp. rate 14, height 5\' 9"  (1.753 m), weight 56.2 kg, SpO2 100 %.      General: awake, alert,  appropriate, sitting up eating breakfast; NAD HENT: conjugate gaze; oropharynx moist CV: regular rate; no JVD Pulmonary: CTA B/L; no W/R/R- good air movement GI: soft, NT, ND, (+)BS Psychiatric: appropriate= more interactive, more understandable Neurological: improved STM, recall- not baseline yet MSK no pain with LE ROM at knee and ankle    LLE- back to baseline  Skin:    General: Skin is warm and dry.     Comments: New DTI on coccyx  Also worsening skin open/pressure Stage II on posterior thigh- on R thigh Also small DTI on R heel  Neurological:     Mental Status: She is alert.     Comments: MAS 2 to 3 RLE, MAS 1+ LLE  Motor 5/5 RUE 3+ LUE 2- RLE 1/5 LLE     Assessment/Plan: 1. Functional deficits which require 3+ hours per day of interdisciplinary therapy in a comprehensive inpatient rehab setting. Physiatrist is providing close team supervision and 24 hour management of active medical problems listed below. Physiatrist and rehab team continue to assess barriers to discharge/monitor patient progress toward functional and medical goals  Care Tool:  Bathing    Body parts bathed by patient: Left  arm, Chest, Abdomen, Front perineal area, Buttocks, Right upper leg, Left upper leg, Face, Right arm   Body parts bathed by helper: Right lower leg, Left lower leg     Bathing assist Assist Level: Moderate Assistance - Patient 50 - 74%     Upper Body Dressing/Undressing Upper body dressing   What is the patient wearing?: Pull over shirt, Bra    Upper body assist Assist Level: Minimal Assistance - Patient > 75%    Lower Body Dressing/Undressing Lower body dressing      What is the patient wearing?: Underwear/pull up, Pants     Lower body assist Assist for lower body dressing: Moderate Assistance - Patient 50 - 74%     Toileting Toileting Toileting Activity did not occur (Clothing management and hygiene only): N/A (no void or bm)  Toileting assist Assist for  toileting: Moderate Assistance - Patient 50 - 74%     Transfers Chair/bed transfer  Transfers assist     Chair/bed transfer assist level: Maximal Assistance - Patient 25 - 49%     Locomotion Ambulation   Ambulation assist   Ambulation activity did not occur: Safety/medical concerns (unable to perform due to pain, weakness and decreased activity tolerance)          Walk 10 feet activity   Assist  Walk 10 feet activity did not occur: Safety/medical concerns (unable to perform due to pain, weakness and decreased activity tolerance)        Walk 50 feet activity   Assist Walk 50 feet with 2 turns activity did not occur: Safety/medical concerns (unable to perform due to pain, weakness and decreased activity tolerance)         Walk 150 feet activity   Assist Walk 150 feet activity did not occur: Safety/medical concerns (unable to perform due to pain, weakness and decreased activity tolerance)         Walk 10 feet on uneven surface  activity   Assist Walk 10 feet on uneven surfaces activity did not occur: Safety/medical concerns (unable to perform due to pain, weakness and decreased activity tolerance)         Wheelchair     Assist Is the patient using a wheelchair?: Yes Type of Wheelchair: Power    Wheelchair assist level: Supervision/Verbal cueing Max wheelchair distance: 300 ft    Wheelchair 50 feet with 2 turns activity    Assist        Assist Level: Supervision/Verbal cueing   Wheelchair 150 feet activity     Assist      Assist Level: Supervision/Verbal cueing   Blood pressure 105/63, pulse 67, temperature 98.2 F (36.8 C), temperature source Oral, resp. rate 14, height 5\' 9"  (1.753 m), weight 56.2 kg, SpO2 100 %.  Medical Problem List and Plan: 1. Functional deficits due to secondary progressive multiple sclerosis and debility from prolonged admission on acute floor for encephalopathy             -patient may  shower              -ELOS/Goals: min A to supervision- 7-10 days  Con't CIR- PT, OT, and SLP  11/28  Con't CIR- PT < OT and SLP 2.  Antithrombotics: -DVT/anticoagulation:  Pharmaceutical: Lovenox             -antiplatelet therapy: none 3. Pain Management:              -Naproxen 500 mg twice daily             -  Tylenol as needed        will change to Nucynta 75 mg q6 hours - will not add Methadone due to chance of polypharmacy   11/14- con't Nucynta- since change didn't increase dose went from 50 mg q4 to 75 mg q6 hours prn-did decrease Am baclofen to 20 mg- con't other doses at 20 mg and reduced doses of zanaflex-tomorrow, if no other issues, will reduce Valium to 2.5 mg q8 hours   4. Mood/Behavior/Sleep: LCSW to evaluate and provide emotional support             -antipsychotic agents: n/a             -Wellbutrin XL 300 mg daily             -amantadine 100 mg TID- per Dr Epimenio Foot 5. Neuropsych/cognition: This patient is capable of making decisions on her own behalf. 6. Skin/Wound Care: Routine skin care checks  11/7- has new DTI on coccyx, worsening now stage II on posterior R thigh and small DTI R heel- added prevalon boots in bed; pt will NOT use air mattress- changing to regular bed due to pt insistence; and foam dressing and turn q2 hours.   11/9- pt doesn't like prevalon boots, but educated on the importance 7. Fluids/Electrolytes/Nutrition: Routine Is and Os and follow-up chemistries             -continue Vitamin C, D3, B12 and fiber supplements 8: MS with spasticity: continue dalfampridine BID and Sanctura              -Baclofen 20 mg twice daily, 40 mg every morning             -Valium 5 mg every 8 hours             -Zanaflex 4 mg twice daily- were reduced due to cognition/sedation on acute- will change out /refill pump Thursday as well as increase dosing of ITB pump  11/8- spasticity slightly more than normal, however increasing ITB pump tomorrow, so will not increase spasticity PO meds  11/9-  increasing ITB pump today- and refilling it.   11/10- pump up to 540 mcg/day  11/14- wait on changing dose- discussed with pt x2 about if we increase ITB PUMP, will help spasticity pain, however will make her functionally weaker. Will d/w pt and husband again as well  11/15- has UTI- will see how things go - infection can increase spasticity 9: GERD: restart Zantac 10: Neurogenic bladder with indwelling Foley; s/p UTI treatment with Zosyn 11: Bowel program: Nutrisource fiber, psyllium caps, Colace, MiraLax, Senekot-S given daily             -placed PRN sorbitol and Fleets orders- has gotten really constipated/needing multiple enemas-   11/9- will increase miralax to BID x 3 days since increasing pump- no BM since admission to rehab 11/7  11/14- LBM x2 on 11/12- if no BM by tomorrow, will do Sorbitol and possibly SSE  11/15- medium-large BM today 12: Dysphagia: ? resolved; now on regular diet; SLP eval 13: Seasonal allergies off home meds: monitor 14: Tooth abscess: missed follow-up appointment; completed Flagyl 15: Hypokalemia: on K+ supplement QOD; follow-up BMP tomorrow- likely due to previous Lasix dose for Edema.    11/8- K+ 4.1  11/14- K+ 4.3 16. Insomnia  11/9- will add Trazodone 50 mg QHS and can titrate up as required  11/10- increase to 150 mg QHS  11/14- will decrease to 100 mg QHS since having some  confusion 17. Increasing confusion/STM memory, recall  11/14- pt asking [place] to check U/A And Cx- since feels getting a UTI- BP on low side as well- asymptomatic per pt  11/15- Pt has significant E Coli UTI- on Zosyn- will con't until sensitivities are back  I spent a total of 37   minutes on total care today- >50% coordination of care- due to d/w PA about UTI- will con't Zosyn until Results come back- has >100k E Coli    LOS: 8 days A FACE TO FACE EVALUATION WAS PERFORMED  Sharon Odom 04/11/2022, 5:56 PM

## 2022-04-11 NOTE — Progress Notes (Signed)
Physical Therapy Session Note  Patient Details  Name: Sharon Odom MRN: [id number] Date of Birth: [birth year]  Today's Date: 04/11/2022 PT Individual Time: 1016-1100, 1423- 1537  PT Individual Time Calculation (min): 44 min , 74 min   Short Term Goals: Week 1:  PT Short Term Goal 1 (Week 1): STG=LTG due ELOS  Skilled Therapeutic Interventions/Progress Updates:      Therapy Documentation Precautions:  Precautions Precautions: Fall Required Braces or Orthoses: Splint/Cast Splint/Cast: L resting hand splint Restrictions Weight Bearing Restrictions: No  Treatment Session 1:  Pt received semi-reclined in bed and agreeable to PT session. Pt without verbal complaints of pain. Pt requires mod A with supine to sit. PT asked pt if she would like to attempt stand pivot with RW to [facility] and pt declined stating " I don't think I can do it." PT unsuccessful with max A squat pivot to PWC and deferred to [place] transfer. Pt requires min A with sit to stand from stedy from elevated bed and transferred to [facility]. Pt with increased difficulty operating w/c controls and requires intermittent assistance for power w/c adjustment (tilt, recline, elevate). Pt left seated in PWC at bedside with all needs in reach.   Treatment Session 2:  Pt received seated in PWC at bedside and reports 9/10 bilateral LE pain. Nursing notified and administered pain medications during session. Pt navigated PWC to dayroom with supervision for safety. Pt performed following exercises with verbal and tactile cues for technique:   - R LE knee extension #1.5 ankle weight 3 x 10   - R LE hip flexion #1.5 ankle weight 3 x 10   - R UE shoulder ER with body blade 2 x 10 s  Pt engaged trunk musculature and obtained upright position while aiming bean bags for target to work on dynamic sitting balance.   Pt navigated w/c to room with supervision for safety and left with all needs in reach.     04/11/22 1520  RLE Tone  RLE Tone Modified  Ashworth  Hamstrings - Modified Ashworth Scale for Grading Hypertonia RLE 2  GASTROCNEMIUS - Modified Ashworth Scale for Grading Hypertonia RLE 3    04/11/22 1521  LLE Tone  LLE Tone Modified Ashworth  Body Part - Modified Ashworth Scale Hamstrings;Gastrocnemius  Hamstrings - Modified Ashworth Scale for Grading Hypertonia LLE 1  Gastrocnemius - Modified Ashworth Scale for Grading Hypertonia LLE 0    Therapy/Group: Individual Therapy  [name] [name] PT, DPT  04/11/2022, 7:46 AM

## 2022-04-11 NOTE — Progress Notes (Signed)
Occupational Therapy Session Note  Patient Details  Name: Sharon Odom MRN: [id number] Date of Birth: 1970/10/08  Today's Date: 04/11/2022 OT Individual Time: 9528-4132 OT Individual Time Calculation (min): 43 min    Short Term Goals: Week 1:  OT Short Term Goal 1 (Week 1): STG=LTG d/t ELOS  Skilled Therapeutic Interventions/Progress Updates:    Patient agreeable to participate in OT session. Reports 8-9/10 pain level in the left arm and BLEs. Pt reports previously being medicated prior to therapy arrival.    Patient participated in skilled OT session focusing on right hand fine motor coordination while participating in self feeding during lunch. Therapist integrated the use of compensatory techniques to maximize pt's available motor coordination in the right hand in order to improve pt's overall independence when attempting to open containers during meals and self care tasks. Pt provided verbal cues and visual demonstration for technique when attempting to open plastic brownie container. Initially attempted to use her thumb to pop corner of container up and was unsuccessful. Pt was educated to pronate right hand use index finger to open container using finger flexion to release top. Pt completed task twice with increased time required due to max difficulty.  Pt opened ice cream container and fruit cup using her right hand with OT stabilizing container. OT provided max VC cueing throughout session to re-direct patient to task (eating her brownie and ice cream) due to decreased attention and pt easily becoming distracted.     Therapy Documentation Precautions:  Precautions Precautions: Fall Required Braces or Orthoses: Splint/Cast Splint/Cast: L resting hand splint Restrictions Weight Bearing Restrictions: No  Therapy/Group: Individual Therapy  Limmie Patricia, OTR/L,CBIS  Supplemental OT - [facility] and [facility]  04/11/2022, 8:03 AM

## 2022-04-12 ENCOUNTER — Ambulatory Visit: Payer: Medicare Other | Admitting: Neurology

## 2022-04-12 LAB — URINE CULTURE: Culture: >=100000 — AB

## 2022-04-12 MED ORDER — CEFAZOLIN SODIUM-DEXTROSE 1-4 GM/50ML-% IV SOLN
1.0000 g | Freq: Three times a day (TID) | INTRAVENOUS | Status: AC
  Administered 2022-04-12 – 2022-04-17 (×15): 1 g via INTRAVENOUS
  Filled 2022-04-12 (×16): qty 50

## 2022-04-12 NOTE — Plan of Care (Signed)
  Problem: RH Balance Goal: LTG: Patient will maintain dynamic sitting balance (OT) Description: LTG:  Patient will maintain dynamic sitting balance with assistance during activities of daily living (OT) Outcome: Progressing   Problem: Sit to Stand Goal: LTG:  Patient will perform sit to stand in prep for activites of daily living with assistance level (OT) Description: LTG:  Patient will perform sit to stand in prep for activites of daily living with assistance level (OT) Outcome: Progressing   Problem: RH Toileting Goal: LTG Patient will perform toileting task (3/3 steps) with assistance level (OT) Description: LTG: Patient will perform toileting task (3/3 steps) with assistance level (OT)  Outcome: Progressing   Problem: RH Simple Meal Prep Goal: LTG Patient will perform simple meal prep w/assist (OT) Description: LTG: Patient will perform simple meal prep with assistance, with/without cues (OT). Outcome: Progressing   Problem: RH Toilet Transfers Goal: LTG Patient will perform toilet transfers w/assist (OT) Description: LTG: Patient will perform toilet transfers with assist, with/without cues using equipment (OT) Outcome: Progressing

## 2022-04-12 NOTE — Progress Notes (Signed)
Physical Therapy Session Note  Patient Details  Name: Gustavo Meditz MRN: [id number] Date of Birth: 05/08/1971  Today's Date: 04/12/2022 PT Individual Time: 1191-4782 PT Individual Time Calculation (min): 27 min  and Today's Date: 04/12/2022 PT Missed Time: 18 Minutes Missed Time Reason: Other (Comment) (eating meal)  Short Term Goals: Week 1:  PT Short Term Goal 1 (Week 1): STG=LTG due ELOS  Skilled Therapeutic Interventions/Progress Updates:      Therapy Documentation Precautions:  Precautions Precautions: Fall Required Braces or Orthoses: Splint/Cast Splint/Cast: L resting hand splint Restrictions Weight Bearing Restrictions: No  Pt received seated in PWC eating lunch and missed 18 minutes of skilled PT due to mealtime. Pt agreeable to treatment session with emphasis on power w/c management and navigation. Pt navigated w/c with close supervision for safety to dayroom and practiced turn negotiation around cones. Pt requires verbal and visual cueing to avoid obstacles and hit target approximately 30% of the time. Pt returned to room and left seated in PWC at bedside with all needs in reach.    Therapy/Group: Individual Therapy  [name] [name] PT, DPT  04/12/2022, 7:53 AM

## 2022-04-12 NOTE — Progress Notes (Signed)
Speech Language Pathology Daily Session Note  Patient Details  Name: Sharon Odom MRN: [id number] Date of Birth: Apr 17, 1971  Today's Date: 04/12/2022 SLP Individual Time: 8250-5397 SLP Individual Time Calculation (min): 45 min  Short Term Goals: Week 2: SLP Short Term Goal 1 (Week 2): Pt will demonstrate sustained attention in 3-5 minute intervals with supervision A direction. SLP Short Term Goal 2 (Week 2): Pt will demonstrate recall of daily information with min A verbal cues for external aids. SLP Short Term Goal 3 (Week 2): Pt will complete basic problem solving skills with min A verbal cues. SLP Short Term Goal 4 (Week 2): Pt will self-monitor and self-correct functional errors with min A verbal cues.  Skilled Therapeutic Interventions: Skilled ST treatment focused on cognitive goals. SLP facilitated session by reviewing compensatory memory strategies and provided further education on uses for memory notebook. Pt recalled events from previous therapy sessions with sup A verbal cues for use of her daily therapy schedule and memory notebook. Pt required use of memory notebook to identify correct DOW and date. Pt liked the idea of writing down what she ate and drank each day so she can keep track of fluid/nutritional intake. SLP summarized what pt worked on in previous OT session, as well as ST, and what she ate/drank during breakfast this morning with sup A verbal cues. Pt sustained attention in 5 minute intervals with sup A verbal redirection cues. Pt was tearful as she discussed her currently challenges with mobility and reduced independence but remaining hopeful for improvement. Patient was left in chair with alarm activated and immediate needs within reach at end of session. Continue per current plan of care.      Pain Pain Assessment Pain Scale: 0-10 Pain Score: 3  Pain Type: Chronic pain Pain Location: Buttocks Pain Descriptors / Indicators: Discomfort Pain Intervention(s):  Medication (See eMAR)  Therapy/Group: Individual Therapy  Sharon Odom 04/12/2022, 9:57 AM

## 2022-04-12 NOTE — Progress Notes (Signed)
PROGRESS NOTE   Subjective/Complaints:  Sweating has resolved Asking if can increase pain meds again.  Discussion per plan below.   Said pain down to 8/10 from 11/10-  Also c/o blurry vision and intermittent double vision- cannot tell when has double vision- doesn't when looking ahead.   ROS:   Pt denies SOB, abd pain, CP, N/V/C/D, and vision changes  Except for HPI  Objective:   No results found. No results for input(s): "WBC", "HGB", "HCT", "PLT" in the last 72 hours.  No results for input(s): "NA", "K", "CL", "CO2", "GLUCOSE", "BUN", "CREATININE", "CALCIUM" in the last 72 hours.   Intake/Output Summary (Last 24 hours) at 04/12/2022 1025 Last data filed at 04/12/2022 0820 Gross per 24 hour  Intake 772.36 ml  Output 750 ml  Net 22.36 ml     Pressure Injury 03/31/22 Sacrum Medial Deep Tissue Pressure Injury - Purple or maroon localized area of discolored intact skin or blood-filled blister due to damage of underlying soft tissue from pressure and/or shear. (Active)  03/31/22 0815  Location: Sacrum  Location Orientation: Medial  Staging: Deep Tissue Pressure Injury - Purple or maroon localized area of discolored intact skin or blood-filled blister due to damage of underlying soft tissue from pressure and/or shear.  Wound Description (Comments):   Present on Admission: Yes     Pressure Injury 04/03/22 Thigh Left;Posterior;Proximal Stage 2 -  Partial thickness loss of dermis presenting as a shallow open injury with a red, pink wound bed without slough. red pink (Active)  04/03/22 1751  Location: Thigh  Location Orientation: Left;Posterior;Proximal  Staging: Stage 2 -  Partial thickness loss of dermis presenting as a shallow open injury with a red, pink wound bed without slough.  Wound Description (Comments): red pink  Present on Admission: Yes    Physical Exam: Vital Signs Blood pressure 105/62, pulse 61,  temperature 98.2 F (36.8 C), temperature source Oral, resp. rate 18, height 5\' 9"  (1.753 m), weight 56.2 kg, SpO2 98 %.       General: awake, alert, appropriate, sitting u in bed; having issues with eating, like normal- dropping a lot of food; OT in room;  NAD HENT: conjugate gaze; oropharynx moist CV: regular rate; no JVD Pulmonary: CTA B/L; no W/R/R- good air movement GI: soft, NT, ND, (+)BS- ITB on R side Psychiatric: appropriate but perseverative Neurological: back to baseline cognitively, having higher level conversations- but perseverative MSK no pain with LE ROM at knee and ankle    LLE- back to baseline  Skin:    General: Skin is warm and dry.     Comments: New DTI on coccyx  Also worsening skin open/pressure Stage II on posterior thigh- on R thigh Also small DTI on R heel  Neurological:     Mental Status: She is alert.     Comments: MAS 2 to 3 RLE, MAS 1+ LLE  Motor 5/5 RUE 3+ LUE 2- RLE 1/5 LLE     Assessment/Plan: 1. Functional deficits which require 3+ hours per day of interdisciplinary therapy in a comprehensive inpatient rehab setting. Physiatrist is providing close team supervision and 24 hour management of active medical problems listed below. Physiatrist and  rehab team continue to assess barriers to discharge/monitor patient progress toward functional and medical goals  Care Tool:  Bathing    Body parts bathed by patient: Left arm, Chest, Abdomen, Front perineal area, Buttocks, Right upper leg, Left upper leg, Face, Right arm   Body parts bathed by helper: Right lower leg, Left lower leg     Bathing assist Assist Level: Moderate Assistance - Patient 50 - 74%     Upper Body Dressing/Undressing Upper body dressing   What is the patient wearing?: Pull over shirt, Bra    Upper body assist Assist Level: Minimal Assistance - Patient > 75%    Lower Body Dressing/Undressing Lower body dressing      What is the patient wearing?: Underwear/pull up,  Pants     Lower body assist Assist for lower body dressing: Moderate Assistance - Patient 50 - 74%     Toileting Toileting Toileting Activity did not occur (Clothing management and hygiene only): N/A (no void or bm)  Toileting assist Assist for toileting: Moderate Assistance - Patient 50 - 74%     Transfers Chair/bed transfer  Transfers assist     Chair/bed transfer assist level: Maximal Assistance - Patient 25 - 49%     Locomotion Ambulation   Ambulation assist   Ambulation activity did not occur: Safety/medical concerns (unable to perform due to pain, weakness and decreased activity tolerance)          Walk 10 feet activity   Assist  Walk 10 feet activity did not occur: Safety/medical concerns (unable to perform due to pain, weakness and decreased activity tolerance)        Walk 50 feet activity   Assist Walk 50 feet with 2 turns activity did not occur: Safety/medical concerns (unable to perform due to pain, weakness and decreased activity tolerance)         Walk 150 feet activity   Assist Walk 150 feet activity did not occur: Safety/medical concerns (unable to perform due to pain, weakness and decreased activity tolerance)         Walk 10 feet on uneven surface  activity   Assist Walk 10 feet on uneven surfaces activity did not occur: Safety/medical concerns (unable to perform due to pain, weakness and decreased activity tolerance)         Wheelchair     Assist Is the patient using a wheelchair?: Yes Type of Wheelchair: Power    Wheelchair assist level: Supervision/Verbal cueing Max wheelchair distance: 300 ft    Wheelchair 50 feet with 2 turns activity    Assist        Assist Level: Supervision/Verbal cueing   Wheelchair 150 feet activity     Assist      Assist Level: Supervision/Verbal cueing   Blood pressure 105/62, pulse 61, temperature 98.2 F (36.8 C), temperature source Oral, resp. rate 18, height 5'  9" (1.753 m), weight 56.2 kg, SpO2 98 %.  Medical Problem List and Plan: 1. Functional deficits due to secondary progressive multiple sclerosis and debility from prolonged admission on acute floor for encephalopathy             -patient may  shower             -ELOS/Goals: min A to supervision- 7-10 days  Con't CIR- PT, OT, and SLP  11/28  Con't CIR- PT, OT and SLP 2.  Antithrombotics: -DVT/anticoagulation:  Pharmaceutical: Lovenox             -antiplatelet therapy:  none 3. Pain Management:              -Naproxen 500 mg twice daily             -Tylenol as needed        will change to Nucynta 75 mg q6 hours - will not add Methadone due to chance of polypharmacy   11/14- con't Nucynta- since change didn't increase dose went from 50 mg q4 to 75 mg q6 hours prn-did decrease Am baclofen to 20 mg- con't other doses at 20 mg and reduced doses of zanaflex-tomorrow, if no other issues, will reduce Valium to 2.5 mg q8 hours    11/16- Has UTI- discussed with pt Oxy vs Nucynta- I will not increase nucynta higher dose - but can change back to Oxycodone 10 mg q4 or 15 mg q6- I don't feel comfortable with 15 mg q4 hours 4. Mood/Behavior/Sleep: LCSW to evaluate and provide emotional support             -antipsychotic agents: n/a             -Wellbutrin XL 300 mg daily             -amantadine 100 mg TID- per Dr Epimenio Foot 5. Neuropsych/cognition: This patient is capable of making decisions on her own behalf. 6. Skin/Wound Care: Routine skin care checks  11/7- has new DTI on coccyx, worsening now stage II on posterior R thigh and small DTI R heel- added prevalon boots in bed; pt will NOT use air mattress- changing to regular bed due to pt insistence; and foam dressing and turn q2 hours.   11/9- pt doesn't like prevalon boots, but educated on the importance 7. Fluids/Electrolytes/Nutrition: Routine Is and Os and follow-up chemistries             -continue Vitamin C, D3, B12 and fiber supplements 8: MS with  spasticity: continue dalfampridine BID and [place]              -Baclofen 20 mg twice daily, 40 mg every morning             -Valium 5 mg every 8 hours             -Zanaflex 4 mg twice daily- were reduced due to cognition/sedation on acute- will change out /refill pump Thursday as well as increase dosing of ITB pump  11/8- spasticity slightly more than normal, however increasing ITB pump tomorrow, so will not increase spasticity PO meds  11/9- increasing ITB pump today- and refilling it.   11/10- pump up to 540 mcg/day  11/14- wait on changing dose- discussed with pt x2 about if we increase ITB PUMP, will help spasticity pain, however will make her functionally weaker. Will d/w pt and husband again as well  11/15- has UTI- will see how things go - infection can increase spasticity 9: GERD: restart Zantac 10: Neurogenic bladder with indwelling Foley; s/p UTI treatment with Zosyn 11: Bowel program: Nutrisource fiber, psyllium caps, Colace, MiraLax, Senekot-S given daily             -placed PRN sorbitol and Fleets orders- has gotten really constipated/needing multiple enemas-   11/9- will increase miralax to BID x 3 days since increasing pump- no BM since admission to rehab 11/7  11/14- LBM x2 on 11/12- if no BM by tomorrow, will do Sorbitol and possibly SSE  11/15- medium-large BM today 12: Dysphagia: ? resolved; now on regular diet; SLP eval 13: Seasonal  allergies off home meds: monitor 14: Tooth abscess: missed follow-up appointment; completed Flagyl 15: Hypokalemia: on K+ supplement QOD; follow-up BMP tomorrow- likely due to previous Lasix dose for Edema.    11/8- K+ 4.1  11/14- K+ 4.3 16. Insomnia  11/9- will add Trazodone 50 mg QHS and can titrate up as required  11/10- increase to 150 mg QHS  11/14- will decrease to 100 mg QHS since having some confusion 17. Increasing confusion/STM memory, recall- UTI with E coli  11/14- pt asking [place] to check U/A And Cx- since feels getting a UTI- BP  on low side as well- asymptomatic per pt  11/15- Pt has significant E Coli UTI- on Zosyn- will con't until sensitivities are back  11/16- Will change to Cefazolin per d/w pharmacy  I spent a total of  35  minutes on total care today- >50% coordination of care- due to d/w pharmacy and OT.  Had prolonged d/w pt that don't want to increase Nucynta- she will think about if she wants to do Oxy vs Nucynta  LOS: 9 days A FACE TO FACE EVALUATION WAS PERFORMED  Sharon Odom 04/12/2022, 10:25 AM

## 2022-04-12 NOTE — Progress Notes (Signed)
Patient refuses to wear prevalon boot, patient educated on risk/benefits.

## 2022-04-12 NOTE — Progress Notes (Signed)
Patient OOB in W/C. Continue to reposition ever 2 hours or less. TX completed as ordered. Continues to request pain medications every 2 hours. States she is starting to have bladder spasms and will talk to Dr. Bea Laura if they continue. IV right forearm patent, ABT given as ordered. No S/S of adverse reactions. Encouraged fluids.

## 2022-04-12 NOTE — Progress Notes (Signed)
Occupational Therapy Weekly Progress Note  Patient Details  Name: Sharon Odom MRN: [id number] Date of Birth: March 14, 1971  Beginning of progress report period: April 04, 2022 End of progress report period: April 12, 2022  Today's Date: 04/12/2022 OT Individual Time: 0800-0900 OT Individual Time Calculation (min): 60 min    Patient has met 0 of 5 short term goals however all STG's are LTG's and progressing. Pt had UTI requiring medication management this past week but has worked in therapy towards all self care, sitting balance and supported standing goals daily. Due to infection, MS status pt continues to require mod A overall for LB self care including toileting and modified squat pivot transfer. OT will continue to address and focus on power w/c skills in room and kitchen in demo apt space this week.    Patient continues to demonstrate the following deficits: muscle weakness, decreased cardiorespiratoy endurance, impaired timing and sequencing, abnormal tone, unbalanced muscle activation, decreased coordination, and decreased motor planning, decreased visual acuity, decreased midline orientation, decreased attention, decreased problem solving, decreased safety awareness, decreased memory, and delayed processing, and decreased sitting balance, decreased standing balance, decreased postural control, hemiplegia, and decreased balance strategies and therefore will continue to benefit from skilled OT intervention to enhance overall performance with BADL, iADL, and Reduce care partner burden.  Patient progressing toward long term goals..  Continue plan of care.  OT Short Term Goals Week 2:  STG's=LTG's d/t LOS   Skilled Therapeutic Interventions/Progress Updates:   Pt seen for weekly reassessment and AM self care re-training visit. MAS performed bed level. Decreased overall tone L UE elbow only currently in UE's noted. MD arrived for brief session with pt and collaboration with OT as well.  Pt reported 8/10 pain to OT which is down from usual 10+/10 pain reported most sessions but focussed discussion on pain issues. OT set up session for am self care and with L LE leg lifter pt moved from supine with HOB elevated to sit with increased time and effort and CGA. EOB with CGA. Squat pivot transfer bed to w/c with mod A. Pt with need for use of power w/c settings for scooting hips back in preparation for sink side self care. Pt completed all UB self care sink side with set up including mouth care, hair care and sponge bathing. LB bathing completed bed level with pm NT during Foley care. Reacher training for hemi techniques for donning lower body pull on garments. Nurse arrived to complete skin checks and reapply skin protection to buttocks.     04/12/2022  RUE Tone  RUE Tone Modified Ashworth  Body Part - Modified Ashworth Scale Elbow;Wrist;Fingers;Thumb  Elbow - Modified Ashworth Scale for Grading Hypertonia RUE 0  Wrist - Modified Ashworth Scale for Grading Hypertonia RUE 0  Fingers - Modified Ashworth Scale for Grading Hypertonia RUE 0  Thumb - Modified Ashworth Scale for Grading Hypertonia RUE 0  Modified Ashworth Scale for Grading Hypertonia RUE 0      04/12/2022  L UE Tone  L UE Tone Modified Ashworth  Body Part - Modified Ashworth Scale Elbow;Wrist;Fingers;Thumb  Elbow - Modified Ashworth Scale for Grading Hypertonia LUE 1  Wrist - Modified Ashworth Scale for Grading Hypertonia LUE 0  Fingers - Modified Ashworth Scale for Grading Hypertonia LUE 0  Thumb - Modified Ashworth Scale for Grading Hypertonia LUE 0  Modified Ashworth Scale for Grading Hypertonia LUE 1    Therapy/Group: Individual Therapy  Barnabas Lister 04/12/2022, 7:53 AM

## 2022-04-13 MED ORDER — ACETAMINOPHEN 500 MG PO TABS
1000.0000 mg | ORAL_TABLET | Freq: Four times a day (QID) | ORAL | Status: DC | PRN
  Administered 2022-04-13 – 2022-04-23 (×19): 1000 mg via ORAL
  Filled 2022-04-13 (×20): qty 2

## 2022-04-13 NOTE — Progress Notes (Signed)
Physical Therapy Session Note  Patient Details  Name: Sharon Odom MRN: [id number] Date of Birth: [birth year]  Today's Date: 04/13/2022 PT Individual Time: 3382-5053, 1100-1200 PT Individual Time Calculation (min): 45 min, 60 min   Short Term Goals: Week 1:  PT Short Term Goal 1 (Week 1): STG=LTG due ELOS  Skilled Therapeutic Interventions/Progress Updates:    Session 1: pt received in bed and agreeable to therapy. Pt reports chronic pain, RN provided medication during session. Donned pants tot A with pt requiring cues and max A for rolling. Supine>sit with leg lifter and increased time, to maneuver BLE with min A and increased time, requires max A for trunk elevation. Pt required max A for sitting balance EOB. Attempted Stand pivot transfer with RW with max A, but pt unable to safely perform transfer even with assist. Therapist then provided assist with pt's arm around therapist for "bear hug" which allowed for easier and safer standing transfer. Pt positioned in w/c with assist and remained with nsg student present at end of session.   Session 2: Pt received in PWC, agreeable to therapy. Pt reports continued chronic pain, premedicated. Rest and positioning provided as needed. Pt requests to work on standing. Pt performed block practice of Sit to stand using stedy for support for global strength and endurance. Pt required extended seated rest breaks with back support between sets d/t fatigue. Performed 5 x 3 with min-max assist, depending on fatigue level and set up. During session, pt reports episode of double vision when looking at sign on wall.  Pt then requests therapist adjust head rest on w/c. Pt positioned in chair with min A and therapist adjusted head rest for comfort. Pt remained in chair with all needs in reach.   Therapy Documentation Precautions:  Precautions Precautions: Fall Required Braces or Orthoses: Splint/Cast Splint/Cast: L resting hand splint Restrictions Weight  Bearing Restrictions: No General:       Therapy/Group: Individual Therapy  Juluis Rainier 04/13/2022, 11:20 AM

## 2022-04-13 NOTE — Progress Notes (Signed)
Occupational Therapy Session Note  Patient Details  Name: Sharon Odom MRN: [id number] Date of Birth: January 03, 1971  Today's Date: 04/13/2022 OT Individual Time: 0900-1000 1445-1530 OT Individual Time Calculation (min): 60 min 45 min   Short Term Goals: Week 2: STG=LTG d/t LOS  Skilled Therapeutic Interventions/Progress Updates:  1st Session:  Pt already up in w/c with lower body self care completed by nursing student and nurse ho were present upon OT arrival. Pt operated power w/c independently up to sink side and throughout session with no cues needed for forward, reverse, recline, foot plates and tilt. Reported much less confusion and increased clarity. MAS conducted in tilt in power w/c. Very low to no increased tone L UE and no increased tone in R UE. See below. UB dressing doffing night gown with S, donning shirt and cardigan with set up and increased time with hemi techniques. Pt introduced to SaeboStim One device for L UE. See parameters and pt response below. Pt agreeable to trial for improved L UE NMRE and awareness. Applied to L deltoids, bicep and then wrist extensors all for 5 minute intervals with excellent response. Pt motivated during trial. OT then incorporated cup to mouth and hand grasp on ADL items with proximal support 10 reps each. Left pt power w/c level to complete am meal with needs and nurse call button in reach.   SaeboStim One: no adverse skin reactions.  330 pulse width 35 Hz pulse rate On 8 sec/ off 8 sec Ramp up/ down 2 sec Symmetrical Biphasic wave form  Max intensity at 500 Ohm load  2nd Session:  Pt up in TIS upon OT arrival. Agreed with earlier plan to mobilize out in power seating to demo apt space kitchen for standing progression. Pt was able to shift power chair to outdoor moderate speed and self propel with R joystick with min cues and S to and from space negotiating several items, groups of visitors and into kitchen to counter and out. S and  increased trials and time given to set up in front of sink and then reposition in lay back system for repositioning at end of session. Stood with use of handlebars for forward weight shift 3 trials of mod A for 30 sec max with seated rests. OT facilitation for L hand grasp on counter and head in midline. Appreciated care coord with PT re: diplopia. Intermittent focus on items or side gaze exacerbation but in general pt reports blurriness versus double vision primary. Will continue to monitor. Once back in room, pt set up with tray table/needs and nurse call button within reach.   Therapy Documentation Precautions:  Precautions Precautions: Fall Required Braces or Orthoses: Splint/Cast Splint/Cast: L resting hand splint Restrictions Weight Bearing Restrictions: No    Therapy/Group: Individual Therapy  Vicenta Dunning 04/13/2022, 7:54 AM

## 2022-04-13 NOTE — Progress Notes (Signed)
PROGRESS NOTE   Subjective/Complaints:  Pt told nursing was having bladder spasms, but didn't c/o that this AM  Pt doesn't want ot accept MS is getting worse-  Says Nucynta is helping- still thinking if wants to change back to Oxy, but admits that Nucynta helpful.   Having weird dreams.  Also c/o double vision-  Wants tylenol increased to 1000 mg QID- I explained can take up to 3x/day max.    ROS:  Pt denies SOB, abd pain, CP, N/V/C/D, and vision changes  Except for HPI  Objective:   No results found. No results for input(s): "WBC", "HGB", "HCT", "PLT" in the last 72 hours.  No results for input(s): "NA", "K", "CL", "CO2", "GLUCOSE", "BUN", "CREATININE", "CALCIUM" in the last 72 hours.   Intake/Output Summary (Last 24 hours) at 04/13/2022 0908 Last data filed at 04/12/2022 1827 Gross per 24 hour  Intake 50 ml  Output 550 ml  Net -500 ml     Pressure Injury 03/31/22 Sacrum Medial Deep Tissue Pressure Injury - Purple or maroon localized area of discolored intact skin or blood-filled blister due to damage of underlying soft tissue from pressure and/or shear. (Active)  03/31/22 0815  Location: Sacrum  Location Orientation: Medial  Staging: Deep Tissue Pressure Injury - Purple or maroon localized area of discolored intact skin or blood-filled blister due to damage of underlying soft tissue from pressure and/or shear.  Wound Description (Comments):   Present on Admission: Yes     Pressure Injury 04/03/22 Thigh Left;Posterior;Proximal Stage 2 -  Partial thickness loss of dermis presenting as a shallow open injury with a red, pink wound bed without slough. red pink (Active)  04/03/22 1751  Location: Thigh  Location Orientation: Left;Posterior;Proximal  Staging: Stage 2 -  Partial thickness loss of dermis presenting as a shallow open injury with a red, pink wound bed without slough.  Wound Description (Comments): red  pink  Present on Admission: Yes    Physical Exam: Vital Signs Blood pressure 96/65, pulse 61, temperature 98 F (36.7 C), temperature source Oral, resp. rate 17, height 5\' 9"  (1.753 m), weight 56.2 kg, SpO2 100 %.        General: awake, alert, appropriate, trying to eat breakfast;  NAD HENT: conjugate gaze; oropharynx moist CV: regular rate; no JVD Pulmonary: CTA B/L; no W/R/R- good air movement GI: soft, NT, ND, (+)BS- ITB pump in place Psychiatric: appropriate but perseverative, wants to go over things again and again Neurological: perseverative- doesn't want to hear what's being said vs doesn't understand- higher level cog issues  LLE- back to baseline  Skin:    General: Skin is warm and dry.     Comments: New DTI on coccyx  Also worsening skin open/pressure Stage II on posterior thigh- on R thigh Also small DTI on R heel  Neurological:     Mental Status: She is alert.     Comments: MAS 2 to 3 RLE, MAS 1+ LLE  Motor 5/5 RUE 3+ LUE 2- RLE 1/5 LLE     Assessment/Plan: 1. Functional deficits which require 3+ hours per day of interdisciplinary therapy in a comprehensive inpatient rehab setting. Physiatrist is providing close team  supervision and 24 hour management of active medical problems listed below. Physiatrist and rehab team continue to assess barriers to discharge/monitor patient progress toward functional and medical goals  Care Tool:  Bathing    Body parts bathed by patient: Left arm, Chest, Abdomen, Front perineal area, Buttocks, Right upper leg, Left upper leg, Face, Right arm   Body parts bathed by helper: Right lower leg, Left lower leg     Bathing assist Assist Level: Moderate Assistance - Patient 50 - 74%     Upper Body Dressing/Undressing Upper body dressing   What is the patient wearing?: Pull over shirt, Bra    Upper body assist Assist Level: Minimal Assistance - Patient > 75%    Lower Body Dressing/Undressing Lower body dressing       What is the patient wearing?: Underwear/pull up, Pants     Lower body assist Assist for lower body dressing: Moderate Assistance - Patient 50 - 74%     Toileting Toileting Toileting Activity did not occur (Clothing management and hygiene only): N/A (no void or bm)  Toileting assist Assist for toileting: Moderate Assistance - Patient 50 - 74%     Transfers Chair/bed transfer  Transfers assist     Chair/bed transfer assist level: Maximal Assistance - Patient 25 - 49%     Locomotion Ambulation   Ambulation assist   Ambulation activity did not occur: Safety/medical concerns (unable to perform due to pain, weakness and decreased activity tolerance)          Walk 10 feet activity   Assist  Walk 10 feet activity did not occur: Safety/medical concerns (unable to perform due to pain, weakness and decreased activity tolerance)        Walk 50 feet activity   Assist Walk 50 feet with 2 turns activity did not occur: Safety/medical concerns (unable to perform due to pain, weakness and decreased activity tolerance)         Walk 150 feet activity   Assist Walk 150 feet activity did not occur: Safety/medical concerns (unable to perform due to pain, weakness and decreased activity tolerance)         Walk 10 feet on uneven surface  activity   Assist Walk 10 feet on uneven surfaces activity did not occur: Safety/medical concerns (unable to perform due to pain, weakness and decreased activity tolerance)         Wheelchair     Assist Is the patient using a wheelchair?: Yes Type of Wheelchair: Power    Wheelchair assist level: Supervision/Verbal cueing Max wheelchair distance: 300 ft    Wheelchair 50 feet with 2 turns activity    Assist        Assist Level: Supervision/Verbal cueing   Wheelchair 150 feet activity     Assist      Assist Level: Supervision/Verbal cueing   Blood pressure 96/65, pulse 61, temperature 98 F (36.7 C),  temperature source Oral, resp. rate 17, height 5\' 9"  (1.753 m), weight 56.2 kg, SpO2 100 %.  Medical Problem List and Plan: 1. Functional deficits due to secondary progressive multiple sclerosis and debility from prolonged admission on acute floor for encephalopathy             -patient may  shower             -ELOS/Goals: min A to supervision- 7-10 days  D/c 11/28  Con't CIR- PT, OT and SLP 2.  Antithrombotics: -DVT/anticoagulation:  Pharmaceutical: Lovenox             -  antiplatelet therapy: none 3. Pain Management:              -Naproxen 500 mg twice daily             -Tylenol as needed        will change to Nucynta 75 mg q6 hours - will not add Methadone due to chance of polypharmacy   11/14- con't Nucynta- since change didn't increase dose went from 50 mg q4 to 75 mg q6 hours prn-did decrease Am baclofen to 20 mg- con't other doses at 20 mg and reduced doses of zanaflex-tomorrow, if no other issues, will reduce Valium to 2.5 mg q8 hours    11/16- Has UTI- discussed with pt Oxy vs Nucynta- I will not increase nucynta higher dose - but can change back to Oxycodone 10 mg q4 or 15 mg q6- I don't feel comfortable with 15 mg q4 hours  11/17- will increase tylenol to 1000 mg but max 3x/day- DO NOT increase pain meds over weekend, esp with lower BP lately.  4. Mood/Behavior/Sleep: LCSW to evaluate and provide emotional support             -antipsychotic agents: n/a             -Wellbutrin XL 300 mg daily             -amantadine 100 mg TID- per Dr Felecia Shelling 5. Neuropsych/cognition: This patient is capable of making decisions on her own behalf. 6. Skin/Wound Care: Routine skin care checks  11/7- has new DTI on coccyx, worsening now stage II on posterior R thigh and small DTI R heel- added prevalon boots in bed; pt will NOT use air mattress- changing to regular bed due to pt insistence; and foam dressing and turn q2 hours.   11/9- pt doesn't like prevalon boots, but educated on the importance 7.  Fluids/Electrolytes/Nutrition: Routine Is and Os and follow-up chemistries             -continue Vitamin C, D3, B12 and fiber supplements 8: MS with spasticity: continue dalfampridine BID and [place]              -Baclofen 20 mg twice daily, 40 mg every morning             -Valium 5 mg every 8 hours             -Zanaflex 4 mg twice daily- were reduced due to cognition/sedation on acute- will change out /refill pump Thursday as well as increase dosing of ITB pump  11/8- spasticity slightly more than normal, however increasing ITB pump tomorrow, so will not increase spasticity PO meds  11/9- increasing ITB pump today- and refilling it.   11/10- pump up to 540 mcg/day  11/14- wait on changing dose- discussed with pt x2 about if we increase ITB PUMP, will help spasticity pain, however will make her functionally weaker. Will d/w pt and husband again as well  11/15- has UTI- will see how things go - infection can increase spasticity  11/17- pt wants to decrease spasticity meds but increase other pain meds- explained not going to do this 9: GERD: restart Zantac 10: Neurogenic bladder with indwelling Foley; s/p UTI treatment with Zosyn 11: Bowel program: Nutrisource fiber, psyllium caps, Colace, MiraLax, Senekot-S given daily             -placed PRN sorbitol and Fleets orders- has gotten really constipated/needing multiple enemas-   11/9- will increase miralax to BID x 3  days since increasing pump- no BM since admission to rehab 11/7  11/14- LBM x2 on 11/12- if no BM by tomorrow, will do Sorbitol and possibly SSE  11/15- medium-large BM today  11/17- if no BM by tomorrow- needs intervention 12: Dysphagia: ? resolved; now on regular diet; SLP eval 13: Seasonal allergies off home meds: monitor 14: Tooth abscess: missed follow-up appointment; completed Flagyl 15: Hypokalemia: on K+ supplement QOD; follow-up BMP tomorrow- likely due to previous Lasix dose for Edema.    11/8- K+ 4.1  11/14- K+ 4.3 16.  Insomnia  11/9- will add Trazodone 50 mg QHS and can titrate up as required  11/10- increase to 150 mg QHS  11/14- will decrease to 100 mg QHS since having some confusion  11/17- having weird dreams but doesn't want to stop Trazodone- explained it's from trazodone.  83. Increasing confusion/STM memory, recall- UTI with E coli  11/14- pt asking [place] to check U/A And Cx- since feels getting a UTI- BP on low side as well- asymptomatic per pt  11/15- Pt has significant E Coli UTI- on Zosyn- will con't until sensitivities are back  11/16- Will change to Cefazolin per d/w pharmacy   I spent a total of 51   minutes on total care today- >50% coordination of care- due to prolonged time with pt- had to explain again about why cannot increase Nucynta- and why the only way to treat spasticity pain is spasticity meds, which will make her feel weaker- she doesn't want to accept her MS is getting worse. We discussed this topic as well.     LOS: 10 days A FACE TO FACE EVALUATION WAS PERFORMED  Saron Vanorman 04/13/2022, 9:08 AM

## 2022-04-14 NOTE — Progress Notes (Signed)
Occupational Therapy Session Note  Patient Details  Name: Sharon Odom MRN: [id number] Date of Birth: 11/10/1970  Today's Date: 04/14/2022 OT Individual Time: 1030-1115 1st Session, 1415-1500 OT Individual Time Calculation (min): 45 min, 45 min    Short Term Goals: Week 2:   STGs=LTGs d/t LOS  Skilled Therapeutic Interventions/Progress Updates:   1st Session: Pt in bed upon OT arrival. Pt made aware of 45 min tx session as pt requesting shower. Agreed to have NT called post bathing for all the remained of ADL and NT De agreeable as well. MAS conducted bed level with flaccid L UE and no increased tone noted whatsoever perhaps contributing to increased L lean, trunk weakness and weakness overall in L hand noted over the last 2 days. Pt required use of leg lifter and bed rails with min A and increased time to move from bed to EOB. Pt able to sit with bottom of bed support with R UE while OT prepared power w/c for transfer bed to chair via squat pivot. Mod-max a required with hand bards on w/c for support. Pt navigated to shower stall and with use of STEDY to wide commode was able to perform sit to stand x 2 with mod A. Seated on wide commode with increased surface area, pt demonstrated significant L lean therefore unsafe for OT to allow to proceed due to 1 person assist and need for pt to use B Ue's for bathing task. Provided transfer assist back to power TIS and pt navigated sink side. Pt donned underwear and pull on pants with mod A over feet and for catheter threading and max A for pulling up while pt stood with B  UE support. NT De took over for UB tasks with pt agreeable to wash hair next session.     04/14/2022  RUE Tone  RUE Tone Modified [place]  Body Part - Modified [place] Scale Elbow;Wrist;Fingers;Thumb  Elbow - Modified [place] Scale for Grading Hypertonia RUE 0  Wrist - Modified [place] Scale for Grading Hypertonia RUE 0  Fingers - Modified [place] Scale for Grading Hypertonia  RUE 0  Thumb - Modified [place] Scale for Grading Hypertonia RUE 0  Modified [place] Scale for Grading Hypertonia RUE 0      04/14/2022  L UE Tone  L UE Tone Modified [place]  Body Part - Modified [place] Scale Elbow;Wrist;Fingers;Thumb  Elbow - Modified [place] Scale for Grading Hypertonia LUE 0  Wrist - Modified [place] Scale for Grading Hypertonia LUE 0  Fingers - Modified [place] Scale for Grading Hypertonia LUE 0  Thumb - Modified [place] Scale for Grading Hypertonia LUE 0  Modified [place] Scale for Grading Hypertonia LUE 0     2nd Session: Pt completing facial  "waxing" power w/c level upon OT arrival. Pt requesting hair washing in TIS w/c with hair tray method sink side in lay back position and forward lean position of TIS. Pt was able to operate TIS for positions with increased time for processing. Pt able to assist with R UE with OT facilitation with L UE proximally for distal hand use. Pt able to complete application of hair products and hair drying with set up. During session, OT discussed care plan for discharge as pt was to discuss with husband seeking increased assistance. Pt mentioned he was looking int private cg support "[employer]" and potentially asking pt's mother to stay initially from [place]. OT strongly encouraged as much support as possible as pt will require 24 hr supervision for safety. Left up  in TIS with all needs and nurse call button in reach.   Therapy Documentation Precautions:  Precautions Precautions: Fall Required Braces or Orthoses: Splint/Cast Splint/Cast: L resting hand splint Restrictions Weight Bearing Restrictions: No   Vicenta Dunning 04/14/2022, 7:48 AM

## 2022-04-14 NOTE — Plan of Care (Signed)
  Problem: Consults Goal: RH SPINAL CORD INJURY PATIENT EDUCATION Description:  See Patient Education module for education specifics.  Outcome: Progressing   Problem: SCI BOWEL ELIMINATION Goal: RH STG MANAGE BOWEL WITH ASSISTANCE Description: STG Manage Bowel with  mod I Assistance. Outcome: Progressing Goal: RH STG SCI MANAGE BOWEL WITH MEDICATION WITH ASSISTANCE Description: STG SCI Manage bowel with medication with mod I assistance. Outcome: Progressing   Problem: SCI BLADDER ELIMINATION Goal: RH STG MANAGE BLADDER WITH EQUIPMENT WITH ASSISTANCE Description: STG Manage Bladder With Equipment With min Assistance Outcome: Progressing   Problem: RH SKIN INTEGRITY Goal: RH STG SKIN FREE OF INFECTION/BREAKDOWN Description: With min assist Outcome: Progressing Goal: RH STG MAINTAIN SKIN INTEGRITY WITH ASSISTANCE Description: STG Maintain Skin Integrity With min  Assistance. Outcome: Progressing   Problem: RH SAFETY Goal: RH STG ADHERE TO SAFETY PRECAUTIONS W/ASSISTANCE/DEVICE Description: STG Adhere to Safety Precautions With cues Assistance/Device. Outcome: Progressing   Problem: RH PAIN MANAGEMENT Goal: RH STG PAIN MANAGED AT OR BELOW PT'S PAIN GOAL Description: < 4 with prns Outcome: Progressing   Problem: RH KNOWLEDGE DEFICIT SCI Goal: RH STG INCREASE KNOWLEDGE OF SELF CARE AFTER SCI Description: Patient and spouse will be able to manage care at discharge using medicationseducational handouts independently Outcome: Progressing   

## 2022-04-15 DIAGNOSIS — M79601 Pain in right arm: Secondary | ICD-10-CM

## 2022-04-15 DIAGNOSIS — R252 Cramp and spasm: Secondary | ICD-10-CM

## 2022-04-15 DIAGNOSIS — M79602 Pain in left arm: Secondary | ICD-10-CM

## 2022-04-15 DIAGNOSIS — K5900 Constipation, unspecified: Secondary | ICD-10-CM

## 2022-04-15 DIAGNOSIS — E876 Hypokalemia: Secondary | ICD-10-CM

## 2022-04-15 MED ORDER — SORBITOL 70 % SOLN
60.0000 mL | Freq: Once | Status: AC
  Administered 2022-04-15: 60 mL via ORAL
  Filled 2022-04-15: qty 60

## 2022-04-15 NOTE — Progress Notes (Signed)
PROGRESS NOTE   Subjective/Complaints:  Reports she has not had BM in a few days and feels constipated. Continues to report pain in legs.  She feels spasticity medications contributing to weakness.    ROS:  Pt denies SOB, abd pain, cough, CP, N/V/C/D, and vision changes  Except for HPI  Objective:   No results found. No results for input(s): "WBC", "HGB", "HCT", "PLT" in the last 72 hours.  No results for input(s): "NA", "K", "CL", "CO2", "GLUCOSE", "BUN", "CREATININE", "CALCIUM" in the last 72 hours.   Intake/Output Summary (Last 24 hours) at 04/15/2022 1553 Last data filed at 04/15/2022 0428 Gross per 24 hour  Intake --  Output 850 ml  Net -850 ml      Pressure Injury 03/31/22 Sacrum Medial Deep Tissue Pressure Injury - Purple or maroon localized area of discolored intact skin or blood-filled blister due to damage of underlying soft tissue from pressure and/or shear. (Active)  03/31/22 0815  Location: Sacrum  Location Orientation: Medial  Staging: Deep Tissue Pressure Injury - Purple or maroon localized area of discolored intact skin or blood-filled blister due to damage of underlying soft tissue from pressure and/or shear.  Wound Description (Comments):   Present on Admission: Yes     Pressure Injury 04/03/22 Thigh Left;Posterior;Proximal Stage 2 -  Partial thickness loss of dermis presenting as a shallow open injury with a red, pink wound bed without slough. red pink (Active)  04/03/22 1751  Location: Thigh  Location Orientation: Left;Posterior;Proximal  Staging: Stage 2 -  Partial thickness loss of dermis presenting as a shallow open injury with a red, pink wound bed without slough.  Wound Description (Comments): red pink  Present on Admission: Yes    Physical Exam: Vital Signs Blood pressure 115/70, pulse 65, temperature 98.4 F (36.9 C), temperature source Oral, resp. rate 20, height 5\' 9"  (1.753 m),  weight 56.2 kg, SpO2 100 %.        General: awake, alert, appropriate, trying to eat breakfast;  NAD HENT: conjugate gaze; oropharynx moist CV: regular rate; no JVD Pulmonary: CTA B/L; no W/R/R- good air movement GI: soft, NT, mildly distended, (+)BS- ITB pump in place Psychiatric: appropriate but perseverative, wants to go over things again and again Neurological: perseverative- doesn't want to hear what's being said vs doesn't understand- higher level cog issues  LLE- back to baseline  Skin:    General: Skin is warm and dry.     Comments: New DTI on coccyx  Also worsening skin open/pressure Stage II on posterior thigh- on R thigh Also small DTI on R heel  Neurological:     Mental Status: She is alert.     Comments: MAS 2 to 3 RLE, MAS 1+ LLE  Motor 5/5 RUE 3+ LUE 2- RLE 1/5 LLE     Assessment/Plan: 1. Functional deficits which require 3+ hours per day of interdisciplinary therapy in a comprehensive inpatient rehab setting. Physiatrist is providing close team supervision and 24 hour management of active medical problems listed below. Physiatrist and rehab team continue to assess barriers to discharge/monitor patient progress toward functional and medical goals  Care Tool:  Bathing    Body parts bathed  by patient: Left arm, Chest, Abdomen, Front perineal area, Buttocks, Right upper leg, Left upper leg, Face, Right arm   Body parts bathed by helper: Right lower leg, Left lower leg     Bathing assist Assist Level: Moderate Assistance - Patient 50 - 74%     Upper Body Dressing/Undressing Upper body dressing   What is the patient wearing?: Pull over shirt, Bra    Upper body assist Assist Level: Minimal Assistance - Patient > 75%    Lower Body Dressing/Undressing Lower body dressing      What is the patient wearing?: Underwear/pull up, Pants     Lower body assist Assist for lower body dressing: Moderate Assistance - Patient 50 - 74%      Toileting Toileting Toileting Activity did not occur (Clothing management and hygiene only): N/A (no void or bm)  Toileting assist Assist for toileting: Moderate Assistance - Patient 50 - 74%     Transfers Chair/bed transfer  Transfers assist     Chair/bed transfer assist level: Maximal Assistance - Patient 25 - 49%     Locomotion Ambulation   Ambulation assist   Ambulation activity did not occur: Safety/medical concerns (unable to perform due to pain, weakness and decreased activity tolerance)          Walk 10 feet activity   Assist  Walk 10 feet activity did not occur: Safety/medical concerns (unable to perform due to pain, weakness and decreased activity tolerance)        Walk 50 feet activity   Assist Walk 50 feet with 2 turns activity did not occur: Safety/medical concerns (unable to perform due to pain, weakness and decreased activity tolerance)         Walk 150 feet activity   Assist Walk 150 feet activity did not occur: Safety/medical concerns (unable to perform due to pain, weakness and decreased activity tolerance)         Walk 10 feet on uneven surface  activity   Assist Walk 10 feet on uneven surfaces activity did not occur: Safety/medical concerns (unable to perform due to pain, weakness and decreased activity tolerance)         Wheelchair     Assist Is the patient using a wheelchair?: Yes Type of Wheelchair: Power    Wheelchair assist level: Supervision/Verbal cueing Max wheelchair distance: 300 ft    Wheelchair 50 feet with 2 turns activity    Assist        Assist Level: Supervision/Verbal cueing   Wheelchair 150 feet activity     Assist      Assist Level: Supervision/Verbal cueing   Blood pressure 115/70, pulse 65, temperature 98.4 F (36.9 C), temperature source Oral, resp. rate 20, height 5\' 9"  (1.753 m), weight 56.2 kg, SpO2 100 %.  Medical Problem List and Plan: 1. Functional deficits due to  secondary progressive multiple sclerosis and debility from prolonged admission on acute floor for encephalopathy             -patient may  shower             -ELOS/Goals: min A to supervision- 7-10 days  D/c 11/28  Con't CIR- PT, OT and SLP  -She would like to f/u Palliative 12/28 to continue discussion, consider reaching out Monday 2.  Antithrombotics: -DVT/anticoagulation:  Pharmaceutical: Lovenox             -antiplatelet therapy: none 3. Pain Management:              -  Naproxen 500 mg twice daily             -Tylenol as needed        will change to Nucynta 75 mg q6 hours - will not add Methadone due to chance of polypharmacy   11/14- con't Nucynta- since change didn't increase dose went from 50 mg q4 to 75 mg q6 hours prn-did decrease Am baclofen to 20 mg- con't other doses at 20 mg and reduced doses of zanaflex-tomorrow, if no other issues, will reduce Valium to 2.5 mg q8 hours    11/16- Has UTI- discussed with pt Oxy vs Nucynta- I will not increase nucynta higher dose - but can change back to Oxycodone 10 mg q4 or 15 mg q6- I don't feel comfortable with 15 mg q4 hours  11/17- will increase tylenol to 1000 mg but max 3x/day- DO NOT increase pain meds over weekend, esp with lower BP lately.   11/19 continue current medications, BP stable 4. Mood/Behavior/Sleep: LCSW to evaluate and provide emotional support             -antipsychotic agents: n/a             -Wellbutrin XL 300 mg daily             -amantadine 100 mg TID- per Dr Epimenio Foot 5. Neuropsych/cognition: This patient is capable of making decisions on her own behalf. 6. Skin/Wound Care: Routine skin care checks  11/7- has new DTI on coccyx, worsening now stage II on posterior R thigh and small DTI R heel- added prevalon boots in bed; pt will NOT use air mattress- changing to regular bed due to pt insistence; and foam dressing and turn q2 hours.   11/9- pt doesn't like prevalon boots, but educated on the importance 7.  Fluids/Electrolytes/Nutrition: Routine Is and Os and follow-up chemistries             -continue Vitamin C, D3, B12 and fiber supplements 8: MS with spasticity: continue dalfampridine BID and [place]              -Baclofen 20 mg twice daily, 40 mg every morning             -Valium 5 mg every 8 hours             -Zanaflex 4 mg twice daily- were reduced due to cognition/sedation on acute- will change out /refill pump Thursday as well as increase dosing of ITB pump  11/8- spasticity slightly more than normal, however increasing ITB pump tomorrow, so will not increase spasticity PO meds  11/9- increasing ITB pump today- and refilling it.   11/10- pump up to 540 mcg/day  11/14- wait on changing dose- discussed with pt x2 about if we increase ITB PUMP, will help spasticity pain, however will make her functionally weaker. Will d/w pt and husband again as well  11/15- has UTI- will see how things go - infection can increase spasticity  11/17- pt wants to decrease spasticity meds but increase other pain meds- explained not going to do this  11/19 She asks not to change oral spasticity medicine dose today, She would like to try a lower pump dose, plans to discuss with Dr. Berline Chough tomorrow, will hold off on making adjustment today   9: GERD: restart Zantac 10: Neurogenic bladder with indwelling Foley; s/p UTI treatment with Zosyn 11: Bowel program: Nutrisource fiber, psyllium caps, Colace, MiraLax, Senekot-S given daily             -  placed PRN sorbitol and Fleets orders- has gotten really constipated/needing multiple enemas-   11/9- will increase miralax to BID x 3 days since increasing pump- no BM since admission to rehab 11/7  11/14- LBM x2 on 11/12- if no BM by tomorrow, will do Sorbitol and possibly SSE  11/15- medium-large BM today  11/17- if no BM by tomorrow- needs intervention  11/19 Sorbitol ordered 12: Dysphagia: ? resolved; now on regular diet; SLP eval 13: Seasonal allergies off home meds:  monitor 14: Tooth abscess: missed follow-up appointment; completed Flagyl 15: Hypokalemia: on K+ supplement QOD; follow-up BMP tomorrow- likely due to previous Lasix dose for Edema.    11/8- K+ 4.1  11/14- K+ 4.3  Recheck tomorrow 16. Insomnia  11/9- will add Trazodone 50 mg QHS and can titrate up as required  11/10- increase to 150 mg QHS  11/14- will decrease to 100 mg QHS since having some confusion  11/17- having weird dreams but doesn't want to stop Trazodone- explained it's from trazodone.  17. Increasing confusion/STM memory, recall- UTI with E coli  11/14- pt asking [place] to check U/A And Cx- since feels getting a UTI- BP on low side as well- asymptomatic per pt  11/15- Pt has significant E Coli UTI- on Zosyn- will con't until sensitivities are back  11/16- Will change to Cefazolin per d/w pharmacy   LOS: 12 days A FACE TO FACE EVALUATION WAS PERFORMED  Fanny Dance 04/15/2022, 3:53 PM

## 2022-04-15 NOTE — Plan of Care (Signed)
PT downgraded goals due to functional decline 2/2 MS   Problem: RH Balance Goal: LTG Patient will maintain dynamic sitting balance (PT) Description: LTG:  Patient will maintain dynamic sitting balance with assistance during mobility activities (PT) Flowsheets (Taken 04/15/2022 1405) LTG: Pt will maintain dynamic sitting balance during mobility activities with:: Moderate Assistance - Patient 50 - 74% Note: Downgrade due to decline in mobility 2/2 progressive MS    Problem: Sit to Stand Goal: LTG:  Patient will perform sit to stand with assistance level (PT) Description: LTG:  Patient will perform sit to stand with assistance level (PT) Flowsheets (Taken 04/15/2022 1405) LTG: PT will perform sit to stand in preparation for functional mobility with assistance level: Moderate Assistance - Patient 50 - 74% Note: Downgrade due to decline in mobility 2/2 progressive MS    Problem: RH Bed to Chair Transfers Goal: LTG Patient will perform bed/chair transfers w/assist (PT) Description: LTG: Patient will perform bed to chair transfers with assistance (PT). Flowsheets (Taken 04/15/2022 1405) LTG: Pt will perform Bed to Chair Transfers with assistance level: Moderate Assistance - Patient 50 - 74% Note: Downgrade due to decline in mobility 2/2 progressive MS

## 2022-04-15 NOTE — Progress Notes (Signed)
Physical Therapy Session Note  Patient Details  Name: Sharon Odom MRN: [id number] Date of Birth: [birth year]  Today's Date: 04/15/2022 PT Individual Time: 1100-1157, 1415-1531 PT Individual Time Calculation (min): 57 min, 76 min   Short Term Goals: Week 1:  PT Short Term Goal 1 (Week 1): STG=LTG due ELOS  Skilled Therapeutic Interventions/Progress Updates:      Therapy Documentation Precautions:  Precautions Precautions: Fall Required Braces or Orthoses: Splint/Cast Splint/Cast: L resting hand splint Restrictions Weight Bearing Restrictions: No  Treatment Session 1:  Pt received semi-reclined in bed with spouse present. PT held discussion with patient and spouse to prepare for discharge. PT educated patient and family that pt would require 24/7 assistance as pt has declined in mobility. Pt spouse reports he has researched hiring additional assistance but financially cannot provide 24/7 care. Pt spouse also inquired about palliative care and advanced directives and PT will inform SW.  Pt and spouse also considering skilled short term rehabilitation following d/c and are interested in receiving additional information. Pt requested to transition from bed to [facility] following discussion and required mod A for supine to sit and max A for squat pivot to PWC. Pt left seated in w/c at bedside with all needs in reach and spouse present.   Treatment Session 2:  Pt received seated in PWC at bedside agreeable to PT. Pt requests need for bowel movement and requires max A with stedy and mod A from stedy flaps. Pt (-) for BM and reports gas. Pt transitioned to max A sit to stand transfer x 1 from [facility] with PT assist along with tactile cueing at ischial tuberosities for posterior chain activation. Pt transitioned to head control strengthening as pt presents with right lateral flexion and left rotation. PT manually performed stretching to right SCM and repositioned pt' cervical spine to neutral position with  towels. Pt left seated in PWC at bedside with nurse present.    Therapy/Group: Individual Therapy  [name] [name] PT, DPT  04/15/2022, 7:56 AM

## 2022-04-15 NOTE — Progress Notes (Signed)
Occupational Therapy Session Note  Patient Details  Name: Amilya Haver MRN: [id number] Date of Birth: Jul 22, 1970  Today's Date: 04/15/2022 OT Individual Time: 1856-3149 OT Individual Time Calculation (min): 60 min    Short Term Goals: Week 2:   STG=LTG 2/2 ELOS  Skilled Therapeutic Interventions/Progress Updates:    OT intervention with focus on LB dressing at bed level. Pt assisted with pulling pants over hips. Rolling R/L in bed with max A. Slow initiation. More then a reasonable amount of time to initiate and complete tasks. Pt required max A for LB dressing at bed level. Pt remained in bed with all needs within reach.   Therapy Documentation Precautions:  Precautions Precautions: Fall Required Braces or Orthoses: Splint/Cast Splint/Cast: L resting hand splint Restrictions Weight Bearing Restrictions: No   Pain: Pain Assessment Pain Scale: 0-10 Pain Score: 3  Pain Type: Acute pain Pain Location: Leg Patients Stated Pain Goal: 0 Pain Intervention(s): premedicated and repositioned   Therapy/Group: Individual Therapy  Rich Brave 04/15/2022, 10:57 AM

## 2022-04-15 NOTE — Progress Notes (Signed)
Physical Therapy Weekly Progress Note  Patient Details  Name: Sharon Odom MRN: [id number] Date of Birth: 11-Oct-1970  Beginning of progress report period: April 04, 2022 End of progress report period: April 15, 2022  Patient is making limited progress to long term goals. PT downgraded goals from min A to mod A due to decline in functional mobility. Pt largely requires mod A for bed mobility and max A for transfers. Pt limited due to impaired cognition, decreased strength, impaired activity tolerance, balance/coordination deficits and pain. Pt and spouse (caregiver) engaged in discussion with PT in regards to discharge and plan to learn about palliative care, advanced directives and SNF placement. Plan for appointment on Tuesday 11/21 with [facility] to add additional features to PWC (lateral support) to improve posture, postioning and safety.   Patient continues to demonstrate the following deficits muscle weakness, decreased cardiorespiratoy endurance, abnormal tone, unbalanced muscle activation, decreased coordination, and decreased motor planning, decreased awareness, decreased problem solving, decreased safety awareness, decreased memory, and delayed processing, and decreased sitting balance, decreased standing balance, decreased postural control, and decreased balance strategies and therefore will continue to benefit from skilled PT intervention to increase functional independence with mobility.  Patient not progressing toward long term goals.  See goal revision..  Plan of care revisions: Goals downgraded to mod A .  PT Short Term Goals Week 1:  PT Short Term Goal 1 (Week 1): STG=LTG due ELOS PT Short Term Goal 1 - Progress (Week 1): Progressing toward goal Week 2:  PT Short Term Goal 1 (Week 2): d/c date >7-10 days, STG=LTG due to ELOS  Skilled Therapeutic Interventions/Progress Updates:      Therapy Documentation Precautions:  Precautions Precautions: Fall Required Braces or Orthoses:  Splint/Cast Splint/Cast: L resting hand splint Restrictions Weight Bearing Restrictions: No    Therapy/Group: Individual Therapy  [name] [name] PT, DPT  04/15/2022, 2:08 PM

## 2022-04-16 DIAGNOSIS — Z7189 Other specified counseling: Secondary | ICD-10-CM

## 2022-04-16 DIAGNOSIS — Z515 Encounter for palliative care: Secondary | ICD-10-CM

## 2022-04-16 LAB — CBC
HCT: 29.4 % — ABNORMAL LOW (ref 36.0–46.0)
Hemoglobin: 8.7 g/dL — ABNORMAL LOW (ref 12.0–15.0)
MCH: 31.2 pg (ref 26.0–34.0)
MCHC: 29.6 g/dL — ABNORMAL LOW (ref 30.0–36.0)
MCV: 105.4 fL — ABNORMAL HIGH (ref 80.0–100.0)
Platelets: 167 10*3/uL (ref 150–400)
RBC: 2.79 MIL/uL — ABNORMAL LOW (ref 3.87–5.11)
RDW: 13.7 % (ref 11.5–15.5)
WBC: 3.8 10*3/uL — ABNORMAL LOW (ref 4.0–10.5)
nRBC: 0 % (ref 0.0–0.2)

## 2022-04-16 LAB — BASIC METABOLIC PANEL
Anion gap: 5 (ref 5–15)
BUN: 19 mg/dL (ref 6–20)
CO2: 27 mmol/L (ref 22–32)
Calcium: 9.6 mg/dL (ref 8.9–10.3)
Chloride: 110 mmol/L (ref 98–111)
Creatinine, Ser: 0.8 mg/dL (ref 0.44–1.00)
GFR, Estimated: >60 mL/min (ref >60)
Glucose, Bld: 117 mg/dL — ABNORMAL HIGH (ref 70–99)
Potassium: 4.3 mmol/L (ref 3.5–5.1)
Sodium: 142 mmol/L (ref 135–145)

## 2022-04-16 NOTE — Progress Notes (Signed)
Palliative:  HPI: 51 y.o. female  with past medical history of relapsing progressive MS, history of baclofen pump infection (November 2022), recent pump replacement (May 2023), thrombocytopenia from [place], anxiety, asthma, and chronic pain admitted on 03/27/2022 from CIR with weakness and fever. Patient is being treated for a UTI with IV Zosyn. MRI of the brain revealed new demyelinated lesions and Solu-Medrol was started.  Neurology was consulted.  Sedating medications were titrated down.  Patient transitioned to inpatient rehab for treatment. PMT was consulted to discuss goals of care.  I received request for palliative follow up from Dr. Dagoberto Ligas to continue discussion with patient and husband with concerns she is not progressing as originally hoped. I have reviewed records including previous palliative notes. I have discussed progress and issues with bedside RN. I met at Saadiya's bedside but no family available. I discussed with Diandra the plan to have continued conversation with she and her husband. She recalls previous conversation with my colleague. She reports that her husband should be en-route to the hospital shortly and then we can talk. I requested RN to notify me when husband arrives to bedside as Verenice does not want to call him if he is driving. I was notified by RN when husband came to bedside and I spoke with him and we agreed to all meet together tomorrow 11/21 at 1600 for goals of care conversation.   All questions/concerns addressed. Emotional support provided.   Exam: Awake, oriented. In wheelchair. Leans to left. Weak voice. No distress. Constipated - abd tight. Generalized weakness.   Plan: - Family meeting 11/21 1102.  25 min  Vinie Sill, NP Palliative Medicine Team Pager [phone] (Please see [email or web address] for schedule) Team Phone [phone]    Greater than 50%  of this time was spent counseling and coordinating care related to the above assessment and plan

## 2022-04-16 NOTE — Progress Notes (Signed)
Physical Therapy Session Note  Patient Details  Name: Sharon Odom MRN: [id number] Date of Birth: 1970/06/22  Today's Date: 04/16/2022 PT Individual Time: 0800-0900, 6948-5462 PT Individual Time Calculation (min): 60 min, 43 min   Short Term Goals: Week 2:  PT Short Term Goal 1 (Week 2): d/c date >7-10 days, STG=LTG due to ELOS  Skilled Therapeutic Interventions/Progress Updates:      Therapy Documentation Precautions:  Precautions Precautions: Fall Required Braces or Orthoses: Splint/Cast Splint/Cast: L resting hand splint Restrictions Weight Bearing Restrictions: No  Treatment Session 1:  Pt agreeable to PT session with emphasis on bed mobility, transfer training and static sitting balance. MD arrived during session and rounded with patient and held discussion regarding discharge disposition and trigger point therapy.  Pt requires min A with rolling to right and max A with rolling to left. Pt requires total A for lower body dressing. Pt requires max A with supine to sit and for static sitting balance edge of bed as PT provided total A for upper body dressing. Pt requires max A for squat pivot to PWC and pt repositioned herself with features. Pt left seated in PWC at bedside with all needs in reach.    Treatment Session 2:  Pt agreeable to PT session and without verbal complaints of pain in session. Pt reports she received pain medication prior to session. PT applied kinesio tape to facilitate proprioceptive input to bilateral SCM as pt with tightness of left SCM. Pt performed resisted (green theraband) right knee extension 3 x 10 and requires visual cues for technique. Pt left seated in PWC with all needs in reach.    Therapy/Group: Individual Therapy  [name] [name] PT, DPT  04/16/2022, 7:41 AM

## 2022-04-16 NOTE — Progress Notes (Signed)
PROGRESS NOTE   Subjective/Complaints:  Pt reports feels weaker due to spasticity /ITB pump.   Having multiple BM's today, however abd still distended. Rough day yesterday- Sharon Odom, husband was here and asked for palliative care to come speak with him/pt about possible ways to care for her- husband also interested in SNF after d/c.    Pt says she hasn't been turned q2 hours.  Has an order- Also spoke to nurse about giving sorbitol after therapy if abdomen still distended.   ROS:  Pt denies SOB, abd pain, cough, CP, N/V/C/D, and vision changes  Except for HPI  Objective:   No results found. Recent Labs    04/16/22 0531  WBC 3.8*  HGB 8.7*  HCT 29.4*  PLT 167    Recent Labs    04/16/22 0751  NA 142  K 4.3  CL 110  CO2 27  GLUCOSE 117*  BUN 19  CREATININE 0.80  CALCIUM 9.6     Intake/Output Summary (Last 24 hours) at 04/16/2022 1838 Last data filed at 04/16/2022 1555 Gross per 24 hour  Intake 100 ml  Output 550 ml  Net -450 ml     Pressure Injury 03/31/22 Sacrum Medial Deep Tissue Pressure Injury - Purple or maroon localized area of discolored intact skin or blood-filled blister due to damage of underlying soft tissue from pressure and/or shear. (Active)  03/31/22 0815  Location: Sacrum  Location Orientation: Medial  Staging: Deep Tissue Pressure Injury - Purple or maroon localized area of discolored intact skin or blood-filled blister due to damage of underlying soft tissue from pressure and/or shear.  Wound Description (Comments):   Present on Admission: Yes     Pressure Injury 04/03/22 Thigh Left;Posterior;Proximal Stage 2 -  Partial thickness loss of dermis presenting as a shallow open injury with a red, pink wound bed without slough. red pink (Active)  04/03/22 1751  Location: Thigh  Location Orientation: Left;Posterior;Proximal  Staging: Stage 2 -  Partial thickness loss of dermis presenting as  a shallow open injury with a red, pink wound bed without slough.  Wound Description (Comments): red pink  Present on Admission: Yes    Physical Exam: Vital Signs Blood pressure 118/88, pulse 67, temperature 98.2 F (36.8 C), temperature source Oral, resp. rate 16, height 5\' 9"  (1.753 m), weight 56.2 kg, SpO2 100 %.         General: awake, alert, appropriate, NAD HENT: conjugate gaze; oropharynx moist CV: regular rate; no JVD Pulmonary: CTA B/L; no W/R/R- good air movement GI: firmer, distended; NT; ITB pump R side; hypoactive BS Psychiatric: appropriate- focused on ITB pump causing her to be weak Neurological: dysarthric;  LLE- back to baseline  Skin:    General: Skin is warm and dry.     Comments: New DTI on coccyx  Also worsening skin open/pressure Stage II on posterior thigh- on R thigh Also small DTI on R heel  Neurological:     Mental Status: She is alert.     Comments: MAS 2 to 3 RLE, MAS 1+ LLE  Motor 5/5 RUE 3+ LUE 2- RLE 1/5 LLE     Assessment/Plan: 1. Functional deficits which require 3+ hours  per day of interdisciplinary therapy in a comprehensive inpatient rehab setting. Physiatrist is providing close team supervision and 24 hour management of active medical problems listed below. Physiatrist and rehab team continue to assess barriers to discharge/monitor patient progress toward functional and medical goals  Care Tool:  Bathing    Body parts bathed by patient: Left arm, Chest, Abdomen, Front perineal area, Buttocks, Right upper leg, Left upper leg, Face, Right arm   Body parts bathed by helper: Right lower leg, Left lower leg     Bathing assist Assist Level: Moderate Assistance - Patient 50 - 74%     Upper Body Dressing/Undressing Upper body dressing   What is the patient wearing?: Pull over shirt, Bra    Upper body assist Assist Level: Minimal Assistance - Patient > 75%    Lower Body Dressing/Undressing Lower body dressing      What is  the patient wearing?: Underwear/pull up, Pants     Lower body assist Assist for lower body dressing: Moderate Assistance - Patient 50 - 74%     Toileting Toileting Toileting Activity did not occur (Clothing management and hygiene only): N/A (no void or bm)  Toileting assist Assist for toileting: Moderate Assistance - Patient 50 - 74%     Transfers Chair/bed transfer  Transfers assist     Chair/bed transfer assist level: Maximal Assistance - Patient 25 - 49%     Locomotion Ambulation   Ambulation assist   Ambulation activity did not occur: Safety/medical concerns (unable to perform due to pain, weakness and decreased activity tolerance)          Walk 10 feet activity   Assist  Walk 10 feet activity did not occur: Safety/medical concerns (unable to perform due to pain, weakness and decreased activity tolerance)        Walk 50 feet activity   Assist Walk 50 feet with 2 turns activity did not occur: Safety/medical concerns (unable to perform due to pain, weakness and decreased activity tolerance)         Walk 150 feet activity   Assist Walk 150 feet activity did not occur: Safety/medical concerns (unable to perform due to pain, weakness and decreased activity tolerance)         Walk 10 feet on uneven surface  activity   Assist Walk 10 feet on uneven surfaces activity did not occur: Safety/medical concerns (unable to perform due to pain, weakness and decreased activity tolerance)         Wheelchair     Assist Is the patient using a wheelchair?: Yes Type of Wheelchair: Power    Wheelchair assist level: Supervision/Verbal cueing Max wheelchair distance: 300 ft    Wheelchair 50 feet with 2 turns activity    Assist        Assist Level: Supervision/Verbal cueing   Wheelchair 150 feet activity     Assist      Assist Level: Supervision/Verbal cueing   Blood pressure 118/88, pulse 67, temperature 98.2 F (36.8 C),  temperature source Oral, resp. rate 16, height 5\' 9"  (1.753 m), weight 56.2 kg, SpO2 100 %.  Medical Problem List and Plan: 1. Functional deficits due to secondary progressive multiple sclerosis and debility from prolonged admission on acute floor for encephalopathy             -patient may  shower             -ELOS/Goals: min A to supervision- 7-10 days  D/c 11/28  Con't CIR- PT, OT  and SLP  -She would like to f/u Palliative Leeroy Bock to continue discussion, consider reaching out Monday  Con't CIR- PT , OT and SLP- will place palliative care consult for pt.  2.  Antithrombotics: -DVT/anticoagulation:  Pharmaceutical: Lovenox             -antiplatelet therapy: none 3. Pain Management:              -Naproxen 500 mg twice daily             -Tylenol as needed        will change to Nucynta 75 mg q6 hours - will not add Methadone due to chance of polypharmacy   11/14- con't Nucynta- since change didn't increase dose went from 50 mg q4 to 75 mg q6 hours prn-did decrease Am baclofen to 20 mg- con't other doses at 20 mg and reduced doses of zanaflex-tomorrow, if no other issues, will reduce Valium to 2.5 mg q8 hours    11/16- Has UTI- discussed with pt Oxy vs Nucynta- I will not increase nucynta higher dose - but can change back to Oxycodone 10 mg q4 or 15 mg q6- I don't feel comfortable with 15 mg q4 hours  11/17- will increase tylenol to 1000 mg but max 3x/day- DO NOT increase pain meds over weekend, esp with lower BP lately.   11/20- will decrease ITB pump tomorrow and do trigger point injections of shoulders/upper back/neck for pain control 4. Mood/Behavior/Sleep: LCSW to evaluate and provide emotional support             -antipsychotic agents: n/a             -Wellbutrin XL 300 mg daily             -amantadine 100 mg TID- per Dr Epimenio Foot 5. Neuropsych/cognition: This patient is capable of making decisions on her own behalf. 6. Skin/Wound Care: Routine skin care checks  11/7- has new DTI on  coccyx, worsening now stage II on posterior R thigh and small DTI R heel- added prevalon boots in bed; pt will NOT use air mattress- changing to regular bed due to pt insistence; and foam dressing and turn q2 hours.   11/9- pt doesn't like prevalon boots, but educated on the importance 7. Fluids/Electrolytes/Nutrition: Routine Is and Os and follow-up chemistries             -continue Vitamin C, D3, B12 and fiber supplements 8: MS with spasticity: continue dalfampridine BID and [place]              -Baclofen 20 mg twice daily, 40 mg every morning             -Valium 5 mg every 8 hours             -Zanaflex 4 mg twice daily- were reduced due to cognition/sedation on acute- will change out /refill pump Thursday as well as increase dosing of ITB pump  11/8- spasticity slightly more than normal, however increasing ITB pump tomorrow, so will not increase spasticity PO meds  11/9- increasing ITB pump today- and refilling it.   11/10- pump up to 540 mcg/day  11/14- wait on changing dose- discussed with pt x2 about if we increase ITB PUMP, will help spasticity pain, however will make her functionally weaker. Will d/w pt and husband again as well  11/15- has UTI- will see how things go - infection can increase spasticity  11/17- pt wants to decrease spasticity meds but  increase other pain meds- explained not going to do this  11/19 She asks not to change oral spasticity medicine dose today, She would like to try a lower pump dose, plans to discuss with Dr. Berline Chough tomorrow, will hold off on making adjustment today  11/20- will reduce dose tomorrow to prior dose.  9: GERD: restart Zantac 10: Neurogenic bladder with indwelling Foley; s/p UTI treatment with Zosyn 11: Bowel program: Nutrisource fiber, psyllium caps, Colace, MiraLax, Senekot-S given daily             -placed PRN sorbitol and Fleets orders- has gotten really constipated/needing multiple enemas-   11/9- will increase miralax to BID x 3 days since  increasing pump- no BM since admission to rehab 11/7  11/14- LBM x2 on 11/12- if no BM by tomorrow, will do Sorbitol and possibly SSE  11/15- medium-large BM today  11/17- if no BM by tomorrow- needs intervention  11/19 Sorbitol ordered 12: Dysphagia: ? resolved; now on regular diet; SLP eval 13: Seasonal allergies off home meds: monitor 14: Tooth abscess: missed follow-up appointment; completed Flagyl 15: Hypokalemia: on K+ supplement QOD; follow-up BMP tomorrow- likely due to previous Lasix dose for Edema.    11/8- K+ 4.1  11/14- K+ 4.3  Recheck tomorrow 16. Insomnia  11/9- will add Trazodone 50 mg QHS and can titrate up as required  11/10- increase to 150 mg QHS  11/14- will decrease to 100 mg QHS since having some confusion  11/17- having weird dreams but doesn't want to stop Trazodone- explained it's from trazodone.  17. Increasing confusion/STM memory, recall- UTI with E coli  11/14- pt asking [place] to check U/A And Cx- since feels getting a UTI- BP on low side as well- asymptomatic per pt  11/15- Pt has significant E Coli UTI- on Zosyn- will con't until sensitivities are back  11/16- Will change to Cefazolin per d/w pharmacy   I spent a total of 37   minutes on total care today- >50% coordination of care- due to d/w PT as well as nursing about medical care.  Will do trigger point injections for neck and decrease ITB pump dose tomorrow. Also placed palliative care referral/consult.   LOS: 13 days A FACE TO FACE EVALUATION WAS PERFORMED  Chett Taniguchi 04/16/2022, 6:38 PM

## 2022-04-16 NOTE — Progress Notes (Signed)
Occupational Therapy Session Note  Patient Details  Name: Sharon Odom MRN: [id number] Date of Birth: June 28, 1970  Today's Date: 04/16/2022 OT Individual Time: 1st Session 1000-1150, 2nd Session 1400-1515 OT Individual Time Calculation (min): 110 min  (50 min make up minutes), 75 min (extra minutes d/t therapy need)    Short Term Goals: Week 2: STG's=LTG's d/t LOS   Skilled Therapeutic Interventions/Progress Updates:  1st  Session:  Pt up in TIS upon OT arrival. Pt requesting toileting. OT conducted MAS in semi-reclined TIS. Low tone in L UE as indicated in table below. OT reported that team is in discussion regarding discharge planning and caregiver support concerns. Pt reported she had 2 BM's yesterday but still feels the need and discussion with MD this am that abdomen is somewhat distended. OT noted improved sit to stand from power w/c in tilt forward position from Sat session as well more midline trunk sitting as last OT session pt had severe L lateral lean on commode vs this session able to sit without physical assist of OT only arm rests support. After increased time, pt did have small BM and pt able to complete peri and buttocks hygiene with mod A. See Flowsheets for data. Sit to stand x 6 with mod-max A with L LE flexion and need for support for LB clothing management. TED hose applied to B LE with noted 1+ L LE edema and set pt up with pillow props for increased LE elevation. After toileting, pt reported fatigue and difficulty with head control seeming to have worsened this rehab stay. Pt presents with " R torticollis" presentation similar to windswept trunk d/t low tone on L side to counteract stronger/intact on R side. Applied SaeoboStim One to L distal neck/sh mm applied for 15 min while OT donned TED hose and provided assist with memory log book entry as well as information on obtaining her own device upon d/c. See parameters below. At end of session pt left up in w.c with all needs and  safety measures in place.   SaeboStim One: no adverse skin reactions.  330 pulse width 35 Hz pulse rate On 8 sec/ off 8 sec Ramp up/ down 2 sec Symmetrical Biphasic wave form  Max intensity at 500 Ohm load     04/16/2022  RUE Tone  RUE Tone Modified Ashworth  Body Part - Modified Ashworth Scale Elbow;Wrist;Fingers;Thumb  Elbow - Modified Ashworth Scale for Grading Hypertonia RUE 0  Wrist - Modified [place]worth Scale for Grading Hypertonia RUE 0  Fingers - Modified Ashworth Scale for Grading Hypertonia RUE 0  Thumb - Modified Ashworth Scale for Grading Hypertonia RUE 0  Modified Ashworth Scale for Grading Hypertonia RUE 0      04/16/2022  L UE Tone  L UE Tone Modified Ashworth  Body Part - Modified Ashworth Scale Elbow;Wrist;Fingers;Thumb  Elbow - Modified Ashworth Scale for Grading Hypertonia LUE 0  Wrist - Modified Ashworth Scale for Grading Hypertonia LUE 0  Fingers - Modified Ashworth Scale for Grading Hypertonia LUE 0  Thumb - Modified Ashworth Scale for Grading Hypertonia LUE 0  Modified Ashworth Scale for Grading Hypertonia LUE 0    2nd Session: Pt seen for pm session of OT with extended time due to need and clinician availability. Pt tolerating B neck mm ktaping from PT session prior however continues with r head turn and torticollis-like presentation. ATP coming out tomorrow for assessment of need for lateral supports as well as head positioning troubleshooting. Pt requesting to work with  husband via text for ordering information for SaeboStim One device as well as a neck pillow that would work in addition to modifications to TIS for support. OT consulted with PT re: type and pt was able to order online for delivery to home next day allowing husband to bring potentially by Wed for use. OT applied estim to L wrist extensors prior to pt using phone and power chair controls (see same parameters below) and pt was able to drive power chair nto day room gym space with min cues  for navi. Pt required mod cues for ordering neck pillow due to small phone and key board. Pt assisted to text husband the info re: estim for purchase. Pt also encouraged to write in logbook using r dom hand. Poor legibility noted with increased time and effort despite OT readjusting neck roll PT modified for more upright table top position for task. OT with strong concern for d/c home with husband working FT that pt requires assistance in day with status as follows:  mod-max A LB self care, mod-max a toilet transfers, mod A UB self care with decline overall in posture, strength and cog/vision and awareness. OT set pt back up in room in TIS with tray table, safety needs and call button in reach.   SaeboStim One: no adverse skin reactions.  330 pulse width 35 Hz pulse rate On 8 sec/ off 8 sec Ramp up/ down 2 sec Symmetrical Biphasic wave form  Max intensity at 500 Ohm load   Therapy Documentation Precautions:  Precautions Precautions: Fall Required Braces or Orthoses: Splint/Cast Splint/Cast: L resting hand splint Restrictions Weight Bearing Restrictions: No    Therapy/Group: Individual Therapy  Vicenta Dunning 04/16/2022, 7:46 AM

## 2022-04-17 ENCOUNTER — Telehealth: Payer: Self-pay | Admitting: Neurology

## 2022-04-17 DIAGNOSIS — Z66 Do not resuscitate: Secondary | ICD-10-CM

## 2022-04-17 MED ORDER — SODIUM CHLORIDE 0.9 % IV SOLN
1000.0000 mg | Freq: Every day | INTRAVENOUS | Status: AC
  Administered 2022-04-17 – 2022-04-20 (×4): 1000 mg via INTRAVENOUS
  Filled 2022-04-17 (×4): qty 16

## 2022-04-17 MED ORDER — LIDOCAINE HCL 1 % IJ SOLN
10.0000 mL | Freq: Once | INTRAMUSCULAR | Status: AC
  Administered 2022-04-17: 10 mL
  Filled 2022-04-17: qty 10

## 2022-04-17 MED ORDER — SODIUM CHLORIDE 0.9 % IV SOLN: 1000.0000 mg | Freq: Every day | INTRAVENOUS | Status: DC

## 2022-04-17 NOTE — Patient Care Conference (Signed)
Inpatient RehabilitationTeam Conference and Plan of Care Update Date: 04/17/2022   Time: 11:42 AM   Patient Name: Sharon Odom      Medical Record Number: [id number]  Date of Birth: [birth year] Sex: Female         Room/Bed: 4W09C/4W09C-01 Payor Info: Payor: BLUE CROSS BLUE SHIELD / Plan: BCBS OTHER / Product Type: *No Product type* /    Admit Date/Time:  04/03/2022  4:29 PM  Primary Diagnosis:  Multiple sclerosis [facility])  Hospital Problems: Principal Problem:   Multiple sclerosis (HCC) Active Problems:   Spasticity   Pressure injury of skin    Expected Discharge Date: Expected Discharge Date: 04/24/22  Team Members Present: Physician leading conference: Dr. Genice Rouge Social Worker Present: Cecile Sheerer, LCSWA Nurse Present: Vedia Pereyra, RN PT Present: [name], PT OT Present: Valetta Fuller, OT SLP Present: Sarita Bottom, SLP PPS Coordinator present : Fae Pippin, SLP     Current Status/Progress Goal Weekly Team Focus  Bowel/Bladder   Pt incontinent B/B with foley catheter. LBM 04/16/22   Pt will regain incontinence   Toilet pt  qshift and prn and perform peri care    Swallow/Nutrition/ Hydration               ADL's   mod-max A LB self care, mod-max a toilet transfers, mod A UB self care with decline overall   downgrading LTGs to mod A LB self care, toilet transfer   Progress toilet transfer to mod A consistently, improve postural control via AE/devices, solidify d/c education and recs    Mobility   mod A bed mobility, min/mod Stedy, max A STS and bed<>chair transfer   mod A  transfers, pt/fam education, PWC modifications    Communication                Safety/Cognition/ Behavioral Observations  Sup to Min A for recall with use of memory notebook and other environmental/external aids   Min A - downgraded on 11/13   basic p-s, emergent awareness, and recall with aids    Pain   Pain well managed with current scheduled/prn medication    Pt will verbalize pain level<3 on 0-10 pain scale   Assess pt for pain qshit and prn    Skin   Stage ii to sacrum with barrier cream and foam dressing   Pt will maintain skin intergrity with no further breakdown  Assess pt skin for infection and breakdown qshift and prn      Discharge Planning:  Home with husband who does work during the day-have encouraged her to hire assist since team recommends 24/7 care at DC   Team Discussion: MS. Neurogenic bowel and bladder. Foley in place. Ancef for UTI. Uncontrolled pain. Stage II to buttocks is healing. Patient weaker. Decreased baclofen pump to 485. Administered trigger point injections this AM. Palliative seeing patient today. Stalls evaluating for lateral trunk support. Mod/Max with goals downgraded. SNF being considered. Recommend 24/7 care for home.  Patient on target to meet rehab goals: Goals downgraded due to decline  *See Care Plan and progress notes for long and short-term goals.   Revisions to Treatment Plan:  Medication adjustments, trigger point injections, palliative consult, downgrade in goals  Teaching Needs: Medications, safety, transfer training, bowel training, foley care, skin/wound care, etc.   Current Barriers to Discharge: Neurogenic bowel and bladder, Wound care, Lack of/limited family support, and Insurance for SNF coverage  Possible Resolutions to Barriers: Family education, nursing education, SNF vs Home.  Medical Summary Current Status: weaker; difficulty to keep head upright- on Ancef for UTI IV-  Barriers to Discharge: Incontinence;Neurogenic Bowel & Bladder;Behavior/Mood;Medical stability;Spasticity;Self-care education;Uncontrolled Pain  Barriers to Discharge Comments: husband cannot take her home- stall;s here to add lateral trunk support and 1 button to push into pressure relief- mod A bed; max stand pivot transfers- Possible Resolutions to [employer] Focus: mtg with palliative care at 4pm today-  has all equipment- set up for H/H-husband wants her to go to SNF- had to downgrade goals- mod-max LB ADLs- with stedy- just declining- supervison/min A for cog- best it's going to get- d/c- SNF?? palliative meeting at 4pm-   Continued Need for Acute Rehabilitation Level of Care: The patient requires daily medical management by a physician with specialized training in physical medicine and rehabilitation for the following reasons: Direction of a multidisciplinary physical rehabilitation program to maximize functional independence : Yes Medical management of patient stability for increased activity during participation in an intensive rehabilitation regime.: Yes Analysis of laboratory values and/or radiology reports with any subsequent need for medication adjustment and/or medical intervention. : Yes   I attest that I was present, lead the team conference, and concur with the assessment and plan of the team.   Jearld Adjutant 04/17/2022, 6:09 PM

## 2022-04-17 NOTE — Progress Notes (Signed)
Occupational Therapy Session Note  Patient Details  Name: Sharon Odom MRN: [id number] Date of Birth: 01-Oct-1970  Today's Date: 04/17/2022 OT Individual Time: 0900-0945 1st Session, 1330-1415 2nd Session  OT Individual Time Calculation (min): 45 min, 45 min    Short Term Goals: Week 2: STG=LTG's d/t ELOS  Skilled Therapeutic Interventions/Progress Updates:   1st Session: Pt seen for am self care retraining visit prior to Team Conference for updated status and care coordination. Pt in bed upon OT arrival. MAS conducted. Pt with significant increased weakness overall and low tone on L side. Strong focus on AE training for LB dressing, EOB bed mobility and transfer sit to stand for LB garment management. See conference notes for team update as pt has declined since IE prior to infections and may need SNF placement due to level of caregiver needs. OT worked with Theatre stage manager and NT for support for sit to stand with max a and squat pivot transfer with max a. Left pt in care of NT to complete UB self care.        04/17/2022  RUE Tone  RUE Tone Modified Ashworth  Body Part - Modified Ashworth Scale Elbow;Wrist;Fingers;Thumb  Elbow - Modified [place]worth Scale for Grading Hypertonia RUE 0  Wrist - Modified [place] Scale for Grading Hypertonia RUE 0  Fingers - Modified Ashworth Scale for Grading Hypertonia RUE 0  Thumb - Modified Ashworth Scale for Grading Hypertonia RUE 0  Modified Ashworth Scale for Grading Hypertonia RUE 0      04/17/2022  L UE Tone  L UE Tone Modified Ashworth  Body Part - Modified Ashworth Scale Elbow;Wrist;Fingers;Thumb  Elbow - Modified [place]worth Scale for Grading Hypertonia LUE 0  Wrist - Modified [place]worth Scale for Grading Hypertonia LUE 0  Fingers - Modified Ashworth Scale for Grading Hypertonia LUE 0  Thumb - Modified [place]worth Scale for Grading Hypertonia LUE 0  Modified [place]worth Scale for Grading Hypertonia LUE 0     2nd Session:  Scott from [employer] present completing w/c modifications upon OT arrival. New lateral supports and increased hip guide support provided for improved sitting posture as well as programmed settings for TIS controls for increased ease and improved functionality. Pt with significantly increased support noted and ATP instructed OT on strategies for lateral support swing away as well as transfers. After ATP left, OT applied SaeboStim One to L wrist extensors for 15 min interval during functional room organization task. Pt able to co-contract L wrist and overall UE with device in place for more forceful and effective grasp. See parameters below. PT arrived for session and left in the care for PT tx.    SaeboStim One: no adverse skin reactions.  330 pulse width 35 Hz pulse rate On 8 sec/ off 8 sec Ramp up/ down 2 sec Symmetrical Biphasic wave form  Max intensity at 500 Ohm load  Therapy Documentation Precautions:  Precautions Precautions: Fall Required Braces or Orthoses: Splint/Cast Splint/Cast: L resting hand splint Restrictions Weight Bearing Restrictions: No    Therapy/Group: Individual Therapy  Vicenta Dunning 04/17/2022, 10:13 PM

## 2022-04-17 NOTE — Progress Notes (Signed)
Physical Therapy Session Note  Patient Details  Name: Sharon Odom MRN: [id number] Date of Birth: 06-23-1970  Today's Date: 04/17/2022 PT Individual Time: 1030-1128 PT Individual Time Calculation (min): 58 min   Short Term Goals: Week 2:  PT Short Term Goal 1 (Week 2): d/c date >7-10 days, STG=LTG due to ELOS  Skilled Therapeutic Interventions/Progress Updates:      Therapy Documentation Precautions:  Precautions Precautions: Fall Required Braces or Orthoses: Splint/Cast Splint/Cast: L resting hand splint Restrictions Weight Bearing Restrictions: No  Pt agreeable to PT session and without verbal complaints of pain in session. Pt requires supervision for safety with self-care tasks at sink and total A for upper body dressing for time management. Scott, ATP representative from [place], present for w/c modification in session. ATP evaluated and modified PWC controls (automatic program button for pressure relief) and lateral trunk supports. Pt requires min A with stedy and mod A for sit to lying. Pt left semi-reclined in bed with all needs in reach.    Therapy/Group: Individual Therapy  [name] [name] PT, DPT  04/17/2022, 7:45 AM

## 2022-04-17 NOTE — Progress Notes (Signed)
Physical Therapy Session Note  Patient Details  Name: Sharon Odom MRN: [id number] Date of Birth: Jun 29, 1970  Today's Date: 04/17/2022 PT Individual Time: 1415-1450 PT Individual Time Calculation (min): 35 min   Short Term Goals: Week 2:  PT Short Term Goal 1 (Week 2): d/c date >7-10 days, STG=LTG due to ELOS  Skilled Therapeutic Interventions/Progress Updates:      Therapy Documentation Precautions:  Precautions Precautions: Fall Required Braces or Orthoses: Splint/Cast Splint/Cast: L resting hand splint Restrictions Weight Bearing Restrictions: No  Pt agreeable to additional PT session and received finishing OT session. Pt requests need to toilet and requires min A for sit to stand with stedy. Pt (-) for BM and requires mod A with sit to stand from [facility] with Stedy and max A for sit to lying. Pt without verbal complaints of pain during session and left in care of nursing semi-reclined in bed.     Therapy/Group: Individual Therapy  [name] [name] PT, DPT  04/17/2022, 3:06 PM

## 2022-04-17 NOTE — Consult Note (Signed)
NEURO HOSPITALIST CONSULT NOTE   Requestig physician: Dr. Berline Chough   Reason for Consult: Progression of MS symptoms   History obtained from: Patient and Chart     HPI:                                                                                                                                          Sharon Odom is an 51 y.o. female with PMHx of secondary progressive MS with left >right sided spasticity after intrathecal baclofen pump. States she has been having worsening of her MS symptoms during her hospitalization, describes paresthesia of her RLE below the knee, says it feels as though she has "open toed cowboy boots". Has also noted her voice has weakened, requires a small portable microphone to amplify her voice. Notes that she has difficulty holding her head up. Also reports worsening blurry vision and double vision. Patient attributes worsening of her symptoms to when her baclofen pump became infected and placed on her right thigh, with he timeline of this starting in 02/17/2021. During this hospitalization in particular she notes her right side, has weakened.  Most recent MRI of brain and spinal cord in 06/18/20223.   Past Medical History:  Diagnosis Date   Allergies    Anxiety    Arthritis    Asthma    seasonal - pollen   Constipation    Depression    Dyspnea    with exertion   GERD (gastroesophageal reflux disease)    Headache    History of hiatal hernia    Left radial nerve palsy 03/15/2020   resolved - Left arm/hand weak   Multiple sclerosis (HCC)    Pneumonia 2007   x 1   Vision abnormalities    Wears glasses     Past Surgical History:  Procedure Laterality Date   COLONOSCOPY     EXCISION MORTON'S NEUROMA Right    fallopian tube removal     INTRATHECAL PUMP IMPLANT Right 02/18/2019   Procedure: INTRATHECAL PUMP IMPLANT;  Surgeon: Odette Fraction, MD;  Location: [facility] OR;  Service: Neurosurgery;  Laterality: Right;  INTRATHECAL PUMP IMPLANT    INTRATHECAL PUMP IMPLANT Right 02/17/2021   Procedure: Baclofen Pump Replacement;  Surgeon: Maeola Harman, MD;  Location: [facility] OR;  Service: Neurosurgery;  Laterality: Right;  3C/RM 21   INTRATHECAL PUMP REVISION N/A 01/11/2022   Procedure: Baclofen Pump revision;  Surgeon: Dawley, Alan Mulder, DO;  Location: [facility] OR;  Service: Neurosurgery;  Laterality: N/A;  RM 21 to follow   PAIN PUMP IMPLANTATION N/A 09/28/2021   Procedure: Insertion of baclofen pump, right lower quadrant;  Surgeon: Dawley, Alan Mulder, DO;  Location: [facility] OR;  Service: Neurosurgery;  Laterality: N/A;   PAIN PUMP REMOVAL Left 04/06/2021   Procedure: Removal of baclofen  pump, Left;  Surgeon: Dawley, Alan Mulder, DO;  Location: [facility] OR;  Service: Neurosurgery;  Laterality: Left;  Lateral/Left//3C rm 19   UPPER GI ENDOSCOPY     WISDOM TOOTH EXTRACTION     WISDOM TOOTH EXTRACTION      Family History  Problem Relation Age of Onset   Diabetes Mother    Healthy Brother    Social History:  reports that she has been smoking cigarettes. She started smoking about 35 years ago. She has a 16.00 pack-year smoking history. She has never used smokeless tobacco. She reports that she does not currently use alcohol. She reports that she does not use drugs.  Allergies  Allergen Reactions   Ziconotide Acetate Shortness Of Breath    Anorexia, weird sensations, could not talk, choking feeling, lost since of taste and smell. Hallucinations, vivid dreams. Confusion and sleepiness. N/v, increase in pain.  Anorexia, weird sensations, could not talk, choking feeling, lost since of taste and smell. Hallucinations, vivid dreams. Confusion and sleepiness. N/v, increase in pain.    Morphine Other (See Comments)    Pt does not recall reaction    Amoxicillin Nausea And Vomiting   Dantrolene Other (See Comments)    Muscle pain   Lamotrigine Other (See Comments)    Join pain   Lyrica [Pregabalin] Nausea And Vomiting    MEDICATIONS:                                                                                                                      Prior to Admission:  Medications Prior to Admission  Medication Sig Dispense Refill Last Dose   albuterol (VENTOLIN HFA) 108 (90 Base) MCG/ACT inhaler Inhale 1-2 puffs into the lungs every 6 (six) hours as needed for wheezing or shortness of breath. 6.7 g 1    amantadine (SYMMETREL) 100 MG capsule Take 1 capsule (100 mg total) by mouth 3 (three) times daily. 60 capsule 0    antiseptic oral rinse (BIOTENE) LIQD 15 mLs by Mouth Rinse route as needed for dry mouth.      Ascorbic Acid (VITAMIN C) 1000 MG tablet Take 1,000 mg by mouth daily.      baclofen (LIORESAL) 20 MG tablet Take 2 tablets (40 mg total) by mouth daily at 6 (six) AM. 30 each 0    baclofen (LIORESAL) 20 MG tablet Take 1 tablet (20 mg total) by mouth 2 (two) times daily. 30 each 0    [EXPIRED] baclofen (LIORESAL) 40 MG/20ML SOLN 40 mLs (80 mg total) by Intrathecal route once for 1 dose. 40 mL 0    bisacodyl (DULCOLAX) 10 MG suppository Place 1 suppository (10 mg total) rectally daily as needed for severe constipation. 12 suppository 0    buPROPion (WELLBUTRIN XL) 300 MG 24 hr tablet TAKE 1 TABLET DAILY 90 tablet 3    calcium carbonate (TUMS - DOSED IN MG ELEMENTAL CALCIUM) 500 MG chewable tablet Chew 1 tablet (200 mg of elemental calcium total) by mouth 3 (three) times  daily as needed for indigestion or heartburn.      carboxymethylcellulose (REFRESH PLUS) 0.5 % SOLN Place 1 drop into both eyes 3 (three) times daily as needed (dry eyes).      Cholecalciferol (VITAMIN D-3) 125 MCG (5000 UT) TABS Take 5,000 Units by mouth daily. 30 tablet 1    conjugated estrogens (PREMARIN) vaginal cream Place 1 Application vaginally 3 (three) times a week. Blueberry size amount for application (Patient not taking: Reported on 03/13/2022)      Cyanocobalamin (VITAMIN B-12) 2500 MCG SUBL Place 1 tablet (2,500 mcg total) under the tongue daily. 30 tablet 0    dalfampridine 10  MG TB12 Take 1 tablet (10 mg total) by mouth 2 (two) times daily. One po bid 180 tablet 3    diazepam (VALIUM) 5 MG tablet Take 1 tablet (5 mg total) by mouth every 8 (eight) hours. 30 tablet 0    Docusate Sodium (DSS) 100 MG CAPS Take by mouth.      Green Tea 150 MG CAPS Green Tea Complex  500 mg daily      naproxen (NAPROSYN) 500 MG tablet Take 1 tablet (500 mg total) by mouth 2 (two) times daily with a meal.      oxyCODONE 10 MG TABS Take 1 tablet (10 mg total) by mouth every 4 (four) hours as needed for moderate pain or severe pain. 30 tablet 0    polyethylene glycol (MIRALAX / GLYCOLAX) 17 g packet Take 17 g by mouth daily. 14 each 0    potassium chloride SA (KLOR-CON M) 20 MEQ tablet Take 1 tablet (20 mEq total) by mouth every other day while taking Lasix 15 tablet 0    Psyllium (METAMUCIL) 0.36 g CAPS Take 5 capsules by mouth daily.      senna-docusate (SENOKOT-S) 8.6-50 MG tablet Take 3 tablets by mouth daily.      tiZANidine (ZANAFLEX) 4 MG tablet TAKE 2 TABLETS THREE TIMES A DAY (Patient taking differently: Take 8 mg by mouth 3 (three) times daily.) 720 tablet 3    trospium (SANCTURA) 20 MG tablet Take 20 mg by mouth 2 (two) times daily.        ROS:                                                                                                                                       History obtained from spouse, chart review, and the patient  General ROS: negative for - chills, fatigue, fever, night sweats, weight gain or weight loss Psychological ROS: negative for - behavioral disorder, hallucinations, memory difficulties, mood swings or suicidal ideation Ophthalmic ROS: negative for - blurry vision, double vision, eye pain or loss of vision ENT ROS: negative for - epistaxis, nasal discharge, oral lesions, sore throat, tinnitus or vertigo Allergy and Immunology ROS: negative for - hives or itchy/watery eyes Hematological and Lymphatic ROS: negative for - bleeding problems,  bruising  or swollen lymph nodes Endocrine ROS: negative for - galactorrhea, hair pattern changes, polydipsia/polyuria or temperature intolerance Respiratory ROS: negative for - cough, hemoptysis, shortness of breath or wheezing Cardiovascular ROS: negative for - chest pain, dyspnea on exertion, edema or irregular heartbeat Gastrointestinal ROS: negative for - abdominal pain, diarrhea, hematemesis, nausea/vomiting or stool incontinence Genito-Urinary ROS: negative for - dysuria, hematuria, incontinence or urinary frequency/urgency Musculoskeletal ROS: negative for - joint swelling or muscular weakness Neurological ROS: as noted in HPI Dermatological ROS: negative for rash and skin lesion changes   Blood pressure 104/67, pulse 71, temperature 98.3 F (36.8 C), temperature source Oral, resp. rate 15, height  (1.753 m), weight 56.2 kg, SpO2 97 %.   General Examination:                                                                                                      Physical Exam  General:  HEENT-  Normocephalic, no lesions, without obvious abnormality.  Normal external eye and conjunctiva.   Cardiovascular- S1-S2 audible, pulses palpable throughout   Lungs-no rhonchi or wheezing noted, no excessive working breathing.  Saturations within normal limits Abdomen- All 4 quadrants palpated and nontender Extremities- Warm, dry and intact Musculoskeletal-no joint tenderness, deformity or swelling Skin-warm and dry, no hyperpigmentation, vitiligo, or suspicious lesions  Neurological Examination Mental Status: Alert, oriented, thought content appropriate.  Speech fluent without evidence of aphasia.  Able to follow 3 step commands without difficulty. Cranial Nerves: II: Discs flat bilaterally; Visual fields grossly normal,  III,IV, VI: ptosis not present, extra-ocular motions intact bilaterally pupils equal, dilated approx 8 mm and sluggishly reactive to light V,VII: smile symmetric, facial light  touch sensation normal bilaterally VIII: hearing normal bilaterally IX,X: uvula rises symmetrically XI: bilateral shoulder shrug, notable weakness at tilting of head towards right.  XII: midline tongue extension Motor: UE Strength  Right  Left Deltoids  4/5  2/5 Triceps   4/5  2/5 Biceps   4/5  2/5 Finger grip  5/5  3/5 LE Strength  Right  Left Hip Flex  4/5  2/5 Knee flexed  4/5  2/5 Knee X  4/5  2/5 Dorsiflexion  5/5  2/5 Plantarflexion  5/5  2/5  Tone and bulk:normal tone throughout; no atrophy noted Sensory: Pinprick and light touch intact throughout, bilaterally Deep Tendon Reflexes: 2+ and symmetric throughout Cerebellar: FNF: slowed and overshooting, limited by weakness bilaterally Rapid alternating movements: slowed Heel-to-shin: unable to perform Gait: deferred   Lab Results: Basic Metabolic Panel: Recent Labs  Lab 04/16/22 0751  NA 142  K 4.3  CL 110  CO2 27  GLUCOSE 117*  BUN 19  CREATININE 0.80  CALCIUM 9.6    CBC: Recent Labs  Lab 04/16/22 0531  WBC 3.8*  HGB 8.7*  HCT 29.4*  MCV 105.4*  PLT 167    Cardiac Enzymes: No results for input(s): "CKTOTAL", "CKMB", "CKMBINDEX", "TROPONINI" in the last 168 hours.  Lipid Panel: No results for input(s): "CHOL", "TRIG", "HDL", "CHOLHDL", "VLDL", "LDLCALC" in the last 168 hours.  Imaging: No results found.  Assessment and plan per attending neurologist   Assessment/ 51 yo female with PMHx of secondary progressive MS admitted to CIR on 11/07 for increased spasticity and worsening mobility.  Patient had excision of baclofen in 12/2021 with 5% increase in baclofen on 03/02/2022.  During her hospitalization patient has developed progressive weakness of left arm and leg along and progression of weakness to her right upper and lower extremity.  Most recent MRI in 11/13/2021.   Impression: MRI flare in the setting of infectious insult and worsening of pre-existing deficits.  Oversedation of baclofen could  also be contributing to her symptoms.    Recommendations: Start Solumedrol 1g daily IV,  11/21-11/25 Order MRI of brain and spinal cord with and without contrast   Lorri Frederick, MD PGY-1 04/17/2022, 4:06 PM    I have seen the patient and reviewed the above note.  She has had progressive vision changes as well as weakness since admission.  She did have some enhancement on her initial MRI, but was not felt to be contributing to her symptoms and therefore it was felt to be a pseudo exacerbation.  The fact that she has continued to worsen would argue at this point that she may benefit from salvage therapy with steroids.  I would also like to look at her spinal cord with MRI, and we will repeat the brain MRI as well.  Solu-Medrol as above, neurology will continue to follow.  Ritta Slot, MD [facility] [phone]  If 7pm- 7am, please page neurology on call as listed in AMION.

## 2022-04-17 NOTE — Progress Notes (Addendum)
Patient ID: Sharon Odom, female   DOB: [birth year], 52 y.o.   MRN: [id number]  This SW covering for primary SW, Becky Dupree.   1229-SW called pt husband Onalee Hua to provide updates from team conference, and d/c date 11/28. He continues to report that there is limited support at home, and providing 24/7 care is not feasible. They intend to meet with palliative care this afternoon at 4pm to discuss other options. Possibly short term rehab. SW explained SNF placement process.  *SW later met with pt in room to discuss above. Pt is reluctant to short term rehab but understands. Looking forward to meet with palliative care today. SW provided pt with SNF list according to insurance.   Cecile Sheerer, MSW, LCSWA Office: [phone] Cell: [phone] Fax: [phone]

## 2022-04-17 NOTE — Progress Notes (Signed)
Speech Language Pathology Weekly Progress and Session Note  Patient Details  Name: Sharon Odom MRN: [id number] Date of Birth: 1971/01/09  Beginning of progress report period: April 10, 2022 End of progress report period: April 17, 2022  Today's Date: 04/17/2022 SLP Individual Time: 0804-0900 SLP Individual Time Calculation (min): 56 min  Short Term Goals: Week 2: SLP Short Term Goal 1 (Week 2): Pt will demonstrate sustained attention in 3-5 minute intervals with supervision A direction. SLP Short Term Goal 1 - Progress (Week 2): Met SLP Short Term Goal 2 (Week 2): Pt will demonstrate recall of daily information with min A verbal cues for external aids. SLP Short Term Goal 2 - Progress (Week 2): Met SLP Short Term Goal 3 (Week 2): Pt will complete basic problem solving skills with min A verbal cues. SLP Short Term Goal 3 - Progress (Week 2): Met SLP Short Term Goal 4 (Week 2): Pt will self-monitor and self-correct functional errors with min A verbal cues. SLP Short Term Goal 4 - Progress (Week 2): Met    New Short Term Goals: Week 3: SLP Short Term Goal 1 (Week 3): STG=LTG due to ELOS 11/28  Weekly Progress Updates: Pt made good progress meeting 4 out 4 goals. Pt demonstrated reduced cognitive skills on 04/09/22, likely due to identified UTI on 04/11/22, with improvement of cognition skills slowly over time. SLP facilitated education of memory strategies, initiated memory notebook and targets sustained attention. Pt continues to demonstrate slow physical progress impacted by fatigue, and novel double vision, which limits participation in some problem solving activities. Pt's barrier at discharge are short term recall, sustained attention and mildly complex problem solving skills. Education is on going. Pt would continue to benefit from skilled ST services in order to maximize functional independence and reduce burden of care, requiring 24 hour supervision at discharge with continued  skilled ST services.      Intensity: Minumum of 1-2 x/day, 30 to 90 minutes Frequency: 3 to 5 out of 7 days Duration/Length of Stay: 11/28 Treatment/Interventions: Patient/family education;Functional tasks;Internal/external aids;Cueing hierarchy;Cognitive remediation/compensation   Daily Session  Skilled Therapeutic Interventions:  Skilled ST services focused on cognitive skills. Pt had just received breakfast piror to SLP entering room. SLP provided min A at the end of the meal for self-feeding due to fatigue. Pt supports cognitive skills increasing and getting closer to baseline after UTI treatment. SLP facilitated reassessment of cognitive skill administering SLUMS, pt scored 16/24 (written portion was not able to be completed due to RUE weakness.) Pt demonstrates improvements in attention and basic problem solving with continues deficits in short term recall and attention/impacted by fatigue. Pt was left in room with call bell within reach and bed alarm set. SLP recommends to continue skilled services    General    Pain Pain Assessment Pain Scale: 0-10 Pain Score: Asleep Faces Pain Scale: Hurts little more Pain Type: Acute pain Pain Location: Leg Pain Orientation: Right;Left Pain Descriptors / Indicators: Aching;Discomfort Pain Frequency: Intermittent Pain Onset: On-going Patients Stated Pain Goal: 1 Pain Intervention(s): Medication (See eMAR);Repositioned  Therapy/Group: Individual Therapy  Eduard Penkala  [facility] 04/17/2022, 8:37 AM

## 2022-04-17 NOTE — Telephone Encounter (Signed)
Received 3 100 units of Botox from [facility].

## 2022-04-17 NOTE — Progress Notes (Signed)
Palliative:  HPI: 51 y.o. female  with past medical history of relapsing progressive MS, history of baclofen pump infection (November 2022), recent pump replacement (May 2023), thrombocytopenia from [place], anxiety, asthma, and chronic pain admitted on 03/27/2022 from CIR with weakness and fever. Patient is being treated for a UTI with IV Zosyn. MRI of the brain revealed new demyelinated lesions and Solu-Medrol was started.  Neurology was consulted.  Sedating medications were titrated down.  Patient transitioned to inpatient rehab for treatment. PMT was consulted to discuss goals of care.     I met today with Mataya and husband, Shanon Brow, at bedside. We had a long discussion regarding Sharon Odom's current functional status and progression of MS. Jalyah is concerned that her progression is secondary to changes in her pump. We discussed the reality that this could be part of her progression but also the unfortunate reality that she is in a different place than she was in April and is not expected to be able to return to that quality of life. Shanon Brow is able to explain his witness to her progression over the past 10 years. He expresses his concern that he is unable to care for her at home and he has contacted private care agency but this does not seem to be a realistic financial option. They explore different options to try and help her to be at home but none at this time seem to be very likely to be possible. Cohen is understandably tearful. I acknowledged frustration for them both to be in this position where they feel helpless to their situation and cannot live their life as they would like.   Lorali tearfully shares with Shanon Brow that this is why they need to have Advance Directives. Katrina becomes more tearful and I sensed that she had more to say. I prompted Azra to share what is on her mind and what she wants [place] to know that she wants. Arlesia shares that she would never want a G tube and that she would  not want to be on machines (verified that she desires DNR). She also shares that when her time comes she would like to die in her home. She wishes to go home if at all possible as she wants to be there with Shanon Brow. Shanon Brow is unable to stop work. Shanon Brow is also concerned about the level of care that Bryar needs at this stage of her illness.   At this time Dr. Leonel Ramsay comes from neurology for assessment and recommendations. I verified with Alyse Low and Shanon Brow plans for DNR and we will plan to meet again tomorrow ~4pm again. I will research any further potential options for care in the home to see if there is any possible way they can make home work as this is Lesa's wish if at all possible.   All questions/concerns addressed. Emotional support provided.   Exam: Alert. Oriented. Appropriately tearful. Generalized weakness. Breathing regular, unlabored. Voice weak. Abd soft.   Plan: - DNR - No feeding tube - Would like to return home ultimately but support at home is lacking (no other family in area; husband works full-time)  69 min  Vinie Sill, NP Palliative Medicine Team Pager [phone] (Please see [email or web address] for schedule) Team Phone [phone]    Greater than 50%  of this time was spent counseling and coordinating care related to the above assessment and plan

## 2022-04-18 ENCOUNTER — Inpatient Hospital Stay (HOSPITAL_COMMUNITY): Payer: BLUE CROSS/BLUE SHIELD

## 2022-04-18 MED ORDER — GADOBUTROL 1 MMOL/ML IV SOLN
5.0000 mL | Freq: Once | INTRAVENOUS | Status: AC | PRN
  Administered 2022-04-18: 5 mL via INTRAVENOUS

## 2022-04-18 NOTE — Progress Notes (Signed)
Physical Therapy Session Note  Patient Details  Name: Arden Axon MRN: [id number] Date of Birth: 15-Aug-1970  Today's Date: 04/18/2022 PT Missed Time: 75 Minutes Missed Time Reason: Unavailable (Comment) (MRI)  Short Term Goals: Week 2:  PT Short Term Goal 1 (Week 2): d/c date >7-10 days, STG=LTG due to ELOS  Skilled Therapeutic Interventions/Progress Updates:      Therapy Documentation Precautions:  Precautions Precautions: Fall Required Braces or Orthoses: Splint/Cast Splint/Cast: L resting hand splint Restrictions Weight Bearing Restrictions: No   Pt missed 75 minutes of skilled physical therapy as pt is off the unit for imaging. Plan to make up as able.    Therapy/Group: Individual Therapy  [name] [name] PT, DPT  04/18/2022, 7:50 AM

## 2022-04-18 NOTE — Progress Notes (Signed)
Speech Language Pathology Daily Session Note  Patient Details  Name: Sharon Odom MRN: [id number] Date of Birth: 1970-08-11  Today's Date: 04/18/2022 SLP Individual Time: 9476-5465 SLP Individual Time Calculation (min): 29 min  Short Term Goals: Week 3: SLP Short Term Goal 1 (Week 3): STG=LTG due to ELOS 11/28  Skilled Therapeutic Interventions:Skilled ST services focused on cognitive skills. Pt demonstrated ability to self direct care, postioning in bed, set up for brushing teeth and set up for washing face with min A verbal cues only due to reduced vocal intensity verse cognitive deficits. Pt began crying and expressed frustration with reduced independence and SLP provided emotional support. SLP updated memory notebook. Pt was left in room with call bell within reach and bed alarm set. SLP recommends to continue skilled services.     Pain Pain Assessment Pain Scale: 0-10 Pain Score: 0-No pain Faces Pain Scale: Hurts a little bit Pain Type: Acute pain  Therapy/Group: Individual Therapy  Jaquavious Mercer  [facility] 04/18/2022, 2:07 PM

## 2022-04-18 NOTE — Progress Notes (Signed)
Neurology Progress Note  Brief HPI: 51 year old patient with history of secondary progressive MS with left greater than right-sided spasticity and intrathecal baclofen pump states that she has been having worsening of MS symptoms.  Patient describes paresthesia of bilateral legs below the knee but states that this is improving.  She also verbalizes increased right-sided weakness, however on exam right side is stronger than left.  MRI of brain and spine shows no active enhancing lesions.  Subjective: Patient reports that the paresthesias of her legs have improved with steroid use, and that she feels a little bit stronger.  She states that she feels hungry and has some heartburn since starting methylprednisolone.  Exam: Vitals:   04/18/22 0426 04/18/22 1534  BP: 111/74 130/74  Pulse: 86 82  Resp: 16 16  Temp: 98.3 F (36.8 C) 98 F (36.7 C)  SpO2: 98% 99%   Gen: In bed, NAD Resp: non-labored breathing, no acute distress Abd: soft, nt  Neuro: Mental Status: Alert and oriented x3 Cranial Nerves: Pupils equal round and reactive, extraocular movements intact, facial sensation symmetrical, face symmetrical, hearing intact to voice, phonation normal, shoulder shrug symmetric, tongue midline Motor: Shoulder abduction 4+ on right, 4- on left, Elbow extension 4+ on right, 4- on left, Elbow flexion 4+ on right, 4 on left, finger grip 4+ on right, 4 on left.  Hip flexion 4-/5 on right, 3 on left, knee flexion 4 on right, 3 on left, knee extension 3 on right and 2 on left, dorsiflexion 4+ on right, 3 on left, plantar flexion 4+ on right, 3 on left Sensory: Intact to light touch throughout DTR: 2+ throughout Gait: Deferred  Pertinent Labs:    Latest Ref Rng & Units 04/16/2022    5:31 AM 04/09/2022    4:55 AM 04/04/2022    5:49 AM  CBC  WBC 4.0 - 10.5 K/uL 3.8  8.0  10.8   Hemoglobin 12.0 - 15.0 g/dL 8.7  93.7  90.2   Hematocrit 36.0 - 46.0 % 29.4  31.7  34.7   Platelets 150 - 400 K/uL 167   277  318        Latest Ref Rng & Units 04/16/2022    7:51 AM 04/09/2022    4:55 AM 04/04/2022    5:49 AM  BMP  Glucose 70 - 99 mg/dL 409  94  735   BUN 6 - 20 mg/dL 19  17  12    Creatinine 0.44 - 1.00 mg/dL  3.29  9.24   Sodium 135 - 145 mmol/L 142  141  142   Potassium 3.5 - 5.1 mmol/L 4.3  4.3  4.1   Chloride 98 - 111 mmol/L 110  105  106   CO2 22 - 32 mmol/L 27  29  27    Calcium 8.9 - 10.3 mg/dL 9.6  9.4  9.8      Imaging Reviewed:  MRI brain: Extensive signal abnormality in supratentorial and infratentorial brain and brainstem consistent with multiple sclerosis, with no lead new lesion or evidence of active demyelination.  MRI cervical and thoracic spine: Small focus of cord signal abnormality at C3-C4 unchanged compared to previous scan.  Previously seen signal abnormality in cervical cord and study from June not seen on this study. No evidence of  demyelinating disease in the thoracic cord  Assessment: 51 year old patient with secondary progressive MS presents with worsening weakness and paresthesias of the bilateral lower extremities.  MRI of brain and spine shows no evidence of active demyelination  within the brain, but small focus of signal abnormality at C3-C4 remains on scan.  Given that patient has had progressive weakness and demonstrates left worse than right weakness on exam, treatment with steroids is reasonable.  Patient has had 2 days of IV Solu-Medrol, and she feels as though she is improving and getting stronger and that her paresthesias have improved.  Reasonable to continue steroids as well as continue PT/OT.  Impression: Multiple sclerosis flare, improving with steroid treatment  Recommendations: 1) continue methylprednisolone 1 g IV daily for 3 more days 2) continue PT/OT    E Ernestina Columbia , MSN, AGACNP-BC [facility] See Amion for schedule and pager information 04/18/2022 6:03 PM

## 2022-04-18 NOTE — Progress Notes (Signed)
Occupational Therapy Session Note  Patient Details  Name: Villa Burgin MRN: [id number] Date of Birth: 07-02-1970  Today's Date: 04/18/2022 OT Individual Time:Missed minutes 60 due to out of room for MRI   Therapy Documentation Precautions:  Precautions Precautions: Fall Required Braces or Orthoses: Splint/Cast Splint/Cast: L resting hand splint Restrictions Weight Bearing Restrictions: No    Therapy/Group: Individual Therapy  Vicenta Dunning 04/18/2022, 1:26 PM

## 2022-04-18 NOTE — Progress Notes (Signed)
Palliative:  HPI: 51 y.o. female  with past medical history of relapsing progressive MS, history of baclofen pump infection (November 2022), recent pump replacement (May 2023), thrombocytopenia from [place], anxiety, asthma, and chronic pain admitted on 03/27/2022 from [facility] with weakness and fever. Patient is being treated for a UTI with IV Zosyn. MRI of the brain revealed new demyelinated lesions and Solu-Medrol was started.  Neurology was consulted.  Sedating medications were titrated down.  Patient transitioned to inpatient rehab for treatment. PMT was consulted to discuss goals of care.     I met again today with Meghanne and husband Sharon Odom. Neurology is completing their exam when I came for visit. I discussed further with Alyse Low and Sharon Odom plan forward. We reviewed conversation yesterday and they have no questions or concerns. I reached out to my contact at local hospice [facility] [place]) for any further ideas or suggestions for home care. They have a list of agencies but nothing more that they can provide. Outpatient palliative can follow but this is minimal support a few times a month so not a lot of help with physical care at home. Sharon Odom has spoken further with a contact with an agency he contacted to see what they can come up with to support her in the home. CSW/CMRN team here are looking into facility/short term rehab options. Francies is hopeful that steroids will help her with some level of improvement. She is still optimistic that she can have further improvement in her functional status. She is also grieving the loss of her independence.   All questions/concerns addressed. Emotional support provided.   Exam: Alert, oriented. No distress. Voice weak. Improved movement on right side today. Breathing regular, unlabored. Lying in bed. Generalized weakness.   Plan: - DNR decided yesterday, No G tube desired.  - She would like to return home if possible. Main issue here is disposition.  Continue support from CSW/CMRN for options.   [place], NP Palliative Medicine Team Pager [phone] (Please see [email or web address] for schedule) Team Phone [phone]    Greater than 50%  of this time was spent counseling and coordinating care related to the above assessment and plan

## 2022-04-18 NOTE — Progress Notes (Signed)
Patient ID: Sharon Odom, female   DOB: 1971-02-14, 51 y.o.   MRN: [id number]  [facility] # [id number]  SW sent out SNF referral in the event placement is decided.   Cecile Sheerer, MSW, LCSWA Office: [phone] Cell: [phone] Fax: [phone]

## 2022-04-19 MED ORDER — PANTOPRAZOLE SODIUM 40 MG PO TBEC
40.0000 mg | DELAYED_RELEASE_TABLET | Freq: Two times a day (BID) | ORAL | Status: DC
  Administered 2022-04-19 – 2022-05-04 (×31): 40 mg via ORAL
  Filled 2022-04-19 (×31): qty 1

## 2022-04-19 NOTE — Progress Notes (Addendum)
PROGRESS NOTE   Subjective/Complaints:  Pt reports confusion due to IV Solumedrol and some weird visual changes- thinks "you have a hairy chest this AM".   Esp sees things when closes her eyes.  Feels RLE and head are stronger than they were Pt was able to pull up in bed once got LUE on head of bed and push up with RLE, which is better  ROS:   Pt denies SOB, abd pain, CP, N/V/C/D, and  (+)vision changes  Except for HPI  Objective:   MR CERVICAL SPINE W WO CONTRAST  Result Date: 04/18/2022 CLINICAL DATA:  Multiple sclerosis with left worse than right spasticity and worsening symptoms. EXAM: MRI CERVICAL AND THORACIC SPINE WITHOUT AND WITH CONTRAST TECHNIQUE: Multiplanar and multiecho pulse sequences of the cervical spine, to include the craniocervical junction and cervicothoracic junction, and the thoracic spine, were obtained without and with intravenous contrast. CONTRAST:  5mL GADAVIST GADOBUTROL 1 MMOL/ML IV SOLN COMPARISON:  Cervical and thoracic spine MRI 11/12/2021 FINDINGS: MRI CERVICAL SPINE FINDINGS Alignment: Normal. Vertebrae: Vertebral body heights are preserved. Background marrow signal is normal. There is no suspicious marrow signal abnormality or marrow edema. There is no abnormal marrow enhancement. Benign intraosseous hemangiomas in the C7 and T1 vertebral bodies are unchanged. Cord: There is unchanged signal abnormality in the left dorsal aspect of the cord at C3-C4 (15-26). Previously seen signal abnormality in the right aspect of the cord at C6 is no longer appreciated there is no abnormal enhancement to suggest active demyelination. Posterior Fossa, vertebral arteries, paraspinal tissues: The posterior fossa is assessed on the separately dictated brain MRI. The vertebral artery flow voids are normal. The paraspinal soft tissues are unremarkable. Disc levels: There is overall mild multilevel degenerative change of the  cervical spine, stable since the prior study and described in detail on that report. There is up to mild spinal canal stenosis and moderate bilateral neural foraminal stenosis at C5-C6. MRI THORACIC SPINE FINDINGS Alignment:  Normal. Vertebrae: Vertebral body heights are preserved. Background marrow signal is normal. There is no suspicious marrow signal abnormality or marrow edema. There is no abnormal marrow enhancement. A few benign intraosseous hemangiomas are again seen. Cord: Evaluation of the cord is degraded by motion artifact. There is no definite convincing evidence of demyelination in the thoracic cord on the current study. Previously seen signal abnormality on the study from 06/18 23 May have been artifactual. Paraspinal and other soft tissues: Unremarkable. Disc levels: There is no disc herniation. There is no significant spinal canal or neural foraminal stenosis. IMPRESSION: 1. Small focus of cord signal abnormality at C3-C4 is unchanged, consistent with demyelinating disease. Additional previously seen signal abnormality in the cervical cord on the study from 11/12/2021 is not appreciated on the current study. 2. Previously seen signal abnormality in the thoracic cord on the study from 11/12/2021 is not seen on the current study and may have been artifactual. No convincing evidence of demyelinating disease in the thoracic cord. 3. No abnormal enhancement to suggest active demyelination. Electronically Signed   By: Lesia Hausen M.D.   On: 04/18/2022 11:57   MR THORACIC SPINE W WO CONTRAST  Result Date: 04/18/2022 CLINICAL  DATA:  Multiple sclerosis with left worse than right spasticity and worsening symptoms. EXAM: MRI CERVICAL AND THORACIC SPINE WITHOUT AND WITH CONTRAST TECHNIQUE: Multiplanar and multiecho pulse sequences of the cervical spine, to include the craniocervical junction and cervicothoracic junction, and the thoracic spine, were obtained without and with intravenous contrast. CONTRAST:   53mL GADAVIST GADOBUTROL 1 MMOL/ML IV SOLN COMPARISON:  Cervical and thoracic spine MRI 11/12/2021 FINDINGS: MRI CERVICAL SPINE FINDINGS Alignment: Normal. Vertebrae: Vertebral body heights are preserved. Background marrow signal is normal. There is no suspicious marrow signal abnormality or marrow edema. There is no abnormal marrow enhancement. Benign intraosseous hemangiomas in the C7 and T1 vertebral bodies are unchanged. Cord: There is unchanged signal abnormality in the left dorsal aspect of the cord at C3-C4 (15-26). Previously seen signal abnormality in the right aspect of the cord at C6 is no longer appreciated there is no abnormal enhancement to suggest active demyelination. Posterior Fossa, vertebral arteries, paraspinal tissues: The posterior fossa is assessed on the separately dictated brain MRI. The vertebral artery flow voids are normal. The paraspinal soft tissues are unremarkable. Disc levels: There is overall mild multilevel degenerative change of the cervical spine, stable since the prior study and described in detail on that report. There is up to mild spinal canal stenosis and moderate bilateral neural foraminal stenosis at C5-C6. MRI THORACIC SPINE FINDINGS Alignment:  Normal. Vertebrae: Vertebral body heights are preserved. Background marrow signal is normal. There is no suspicious marrow signal abnormality or marrow edema. There is no abnormal marrow enhancement. A few benign intraosseous hemangiomas are again seen. Cord: Evaluation of the cord is degraded by motion artifact. There is no definite convincing evidence of demyelination in the thoracic cord on the current study. Previously seen signal abnormality on the study from 06/18 23 May have been artifactual. Paraspinal and other soft tissues: Unremarkable. Disc levels: There is no disc herniation. There is no significant spinal canal or neural foraminal stenosis. IMPRESSION: 1. Small focus of cord signal abnormality at C3-C4 is unchanged,  consistent with demyelinating disease. Additional previously seen signal abnormality in the cervical cord on the study from 11/12/2021 is not appreciated on the current study. 2. Previously seen signal abnormality in the thoracic cord on the study from 11/12/2021 is not seen on the current study and may have been artifactual. No convincing evidence of demyelinating disease in the thoracic cord. 3. No abnormal enhancement to suggest active demyelination. Electronically Signed   By: Lesia Hausen M.D.   On: 04/18/2022 11:57   MR BRAIN W WO CONTRAST  Result Date: 04/18/2022 CLINICAL DATA:  Multiple sclerosis with left worse than right spasticity following intrathecal baclofen pump. EXAM: MRI HEAD WITHOUT AND WITH CONTRAST TECHNIQUE: Multiplanar, multiecho pulse sequences of the brain and surrounding structures were obtained without and with intravenous contrast. CONTRAST:  45mL GADAVIST GADOBUTROL 1 MMOL/ML IV SOLN COMPARISON:  Brain MRI 03/26/2022 FINDINGS: Brain: Again seen are numerous foci of FLAIR signal abnormality in the juxtacortical, periventricular, and infratentorial white matter as well as the brainstem consistent with multiple sclerosis, overall unchanged since the MRI from 03/26/2022. There is no diffusion restriction or abnormal enhancement to suggest active demyelination. Previously seen enhancement of the lesion in the left occipital lobe has resolved. There is no acute intracranial hemorrhage, extra-axial fluid collection, or acute infarct. Background parenchymal volume is normal. The ventricles are normal in size. Gray-white differentiation is preserved There is no mass lesion.  There is no mass effect or midline shift. Vascular: Normal flow  voids. Skull and upper cervical spine: Normal marrow signal. The cervical cord is assessed on the separately dictated MRI cervical spine. Sinuses/Orbits: The paranasal sinuses are clear. The globes and orbits are unremarkable. The optic nerves are grossly  unremarkable on these nondedicated sequences. Other: None. IMPRESSION: Extensive signal abnormality in the supratentorial and infratentorial brain and brainstem consistent with multiple sclerosis, similar to the prior study with no new lesion or evidence of active demyelination. Previously seen enhancement of the left occipital lobe lesion has resolved. Electronically Signed   By: Lesia Hausen M.D.   On: 04/18/2022 11:40   No results for input(s): "WBC", "HGB", "HCT", "PLT" in the last 72 hours.   No results for input(s): "NA", "K", "CL", "CO2", "GLUCOSE", "BUN", "CREATININE", "CALCIUM" in the last 72 hours.    Intake/Output Summary (Last 24 hours) at 04/19/2022 0900 Last data filed at 04/19/2022 0423 Gross per 24 hour  Intake 224.45 ml  Output 650 ml  Net -425.55 ml     Pressure Injury 03/31/22 Sacrum Medial Deep Tissue Pressure Injury - Purple or maroon localized area of discolored intact skin or blood-filled blister due to damage of underlying soft tissue from pressure and/or shear. (Active)  03/31/22 0815  Location: Sacrum  Location Orientation: Medial  Staging: Deep Tissue Pressure Injury - Purple or maroon localized area of discolored intact skin or blood-filled blister due to damage of underlying soft tissue from pressure and/or shear.  Wound Description (Comments):   Present on Admission: Yes     Pressure Injury 04/03/22 Thigh Left;Posterior;Proximal Stage 2 -  Partial thickness loss of dermis presenting as a shallow open injury with a red, pink wound bed without slough. red pink (Active)  04/03/22 1751  Location: Thigh  Location Orientation: Left;Posterior;Proximal  Staging: Stage 2 -  Partial thickness loss of dermis presenting as a shallow open injury with a red, pink wound bed without slough.  Wound Description (Comments): red pink  Present on Admission: Yes    Physical Exam: Vital Signs Blood pressure 106/62, pulse 62, temperature 98.4 F (36.9 C), temperature source  Oral, resp. rate 18, height 5\' 9"  (1.753 m), weight 56.2 kg, SpO2 98 %.          General: awake, alert, appropriate, lying in bed; looks frustrated; on R side; NAD HENT: conjugate gaze; oropharynx moist CV: regular rate; no JVD Pulmonary: CTA B/L; no W/R/R- good air movement GI: soft, NT, ND, (+)BS- ITB pump on R side Psychiatric: appropriate, not agitated, but frustrated about breakfast Neurological: slightly-moderate confusion, but able to recognize staff and myself and why here- couldn't grab HOB with L hand without a lot of Assistance  LLE- back to baseline  Skin:    General: Skin is warm and dry.     Comments: New DTI on coccyx  Also worsening skin open/pressure Stage II on posterior thigh- on R thigh Also small DTI on R heel  Neurological:     Mental Status: She is alert.     Comments: MAS 2 to 3 RLE, MAS 1+ LLE  Motor 5/5 RUE 3+ LUE 2- RLE 1/5 LLE   MS: Head lolling somewhat to right, but able to bring back to midline without assistance  Assessment/Plan: 1. Functional deficits which require 3+ hours per day of interdisciplinary therapy in a comprehensive inpatient rehab setting. Physiatrist is providing close team supervision and 24 hour management of active medical problems listed below. Physiatrist and rehab team continue to assess barriers to discharge/monitor patient progress toward functional and  medical goals  Care Tool:  Bathing    Body parts bathed by patient: Left arm, Chest, Abdomen, Front perineal area, Buttocks, Right upper leg, Left upper leg, Face, Right arm   Body parts bathed by helper: Right lower leg, Left lower leg     Bathing assist Assist Level: Moderate Assistance - Patient 50 - 74%     Upper Body Dressing/Undressing Upper body dressing   What is the patient wearing?: Pull over shirt, Bra    Upper body assist Assist Level: Minimal Assistance - Patient > 75%    Lower Body Dressing/Undressing Lower body dressing      What is the  patient wearing?: Underwear/pull up, Pants     Lower body assist Assist for lower body dressing: Moderate Assistance - Patient 50 - 74%     Toileting Toileting Toileting Activity did not occur (Clothing management and hygiene only): N/A (no void or bm)  Toileting assist Assist for toileting: Moderate Assistance - Patient 50 - 74%     Transfers Chair/bed transfer  Transfers assist     Chair/bed transfer assist level: Maximal Assistance - Patient 25 - 49%     Locomotion Ambulation   Ambulation assist   Ambulation activity did not occur: Safety/medical concerns (unable to perform due to pain, weakness and decreased activity tolerance)          Walk 10 feet activity   Assist  Walk 10 feet activity did not occur: Safety/medical concerns (unable to perform due to pain, weakness and decreased activity tolerance)        Walk 50 feet activity   Assist Walk 50 feet with 2 turns activity did not occur: Safety/medical concerns (unable to perform due to pain, weakness and decreased activity tolerance)         Walk 150 feet activity   Assist Walk 150 feet activity did not occur: Safety/medical concerns (unable to perform due to pain, weakness and decreased activity tolerance)         Walk 10 feet on uneven surface  activity   Assist Walk 10 feet on uneven surfaces activity did not occur: Safety/medical concerns (unable to perform due to pain, weakness and decreased activity tolerance)         Wheelchair     Assist Is the patient using a wheelchair?: Yes Type of Wheelchair: Power    Wheelchair assist level: Supervision/Verbal cueing Max wheelchair distance: 300 ft    Wheelchair 50 feet with 2 turns activity    Assist        Assist Level: Supervision/Verbal cueing   Wheelchair 150 feet activity     Assist      Assist Level: Supervision/Verbal cueing   Blood pressure 106/62, pulse 62, temperature 98.4 F (36.9 C), temperature  source Oral, resp. rate 18, height 5\' 9"  (1.753 m), weight 56.2 kg, SpO2 98 %.  Medical Problem List and Plan: 1. Functional deficits due to secondary progressive multiple sclerosis and debility from prolonged admission on acute floor for encephalopathy             -patient may  shower             -ELOS/Goals: min A to supervision- 7-10 days  D/c 11/28  Con't CIR- PT, OT and SLP  -She would like to f/u Palliative Leeroy Bock to continue discussion, consider reaching out Monday  Con't CIR- PT , OT and SLP- will place palliative care consult for pt.   Palliative care spoke with pt- they are  looking into SNF 2.  Antithrombotics: -DVT/anticoagulation:  Pharmaceutical: Lovenox             -antiplatelet therapy: none 3. Pain Management:              -Naproxen 500 mg twice daily             -Tylenol as needed        will change to Nucynta 75 mg q6 hours - will not add Methadone due to chance of polypharmacy   11/14- con't Nucynta- since change didn't increase dose went from 50 mg q4 to 75 mg q6 hours prn-did decrease Am baclofen to 20 mg- con't other doses at 20 mg and reduced doses of zanaflex-tomorrow, if no other issues, will reduce Valium to 2.5 mg q8 hours    11/16- Has UTI- discussed with pt Oxy vs Nucynta- I will not increase nucynta higher dose - but can change back to Oxycodone 10 mg q4 or 15 mg q6- I don't feel comfortable with 15 mg q4 hours  11/17- will increase tylenol to 1000 mg but max 3x/day- DO NOT increase pain meds over weekend, esp with lower BP lately.   11/20- will decrease ITB pump tomorrow and do trigger point injections of shoulders/upper back/neck for pain control 11/23- TrP injections done 11/21- didn't help pain in head/neck- con't regimen for now 4. Mood/Behavior/Sleep: LCSW to evaluate and provide emotional support             -antipsychotic agents: n/a             -Wellbutrin XL 300 mg daily             -amantadine 100 mg TID- per Dr Epimenio Foot 5. Neuropsych/cognition:  This patient is capable of making decisions on her own behalf. 6. Skin/Wound Care: Routine skin care checks  11/7- has new DTI on coccyx, worsening now stage II on posterior R thigh and small DTI R heel- added prevalon boots in bed; pt will NOT use air mattress- changing to regular bed due to pt insistence; and foam dressing and turn q2 hours.   11/9- pt doesn't like prevalon boots, but educated on the importance 7. Fluids/Electrolytes/Nutrition: Routine Is and Os and follow-up chemistries             -continue Vitamin C, D3, B12 and fiber supplements 8: MS with spasticity: continue dalfampridine BID and [place]              -Baclofen 20 mg twice daily, 40 mg every morning             -Valium 5 mg every 8 hours             -Zanaflex 4 mg twice daily- were reduced due to cognition/sedation on acute- will change out /refill pump Thursday as well as increase dosing of ITB pump  11/8- spasticity slightly more than normal, however increasing ITB pump tomorrow, so will not increase spasticity PO meds  11/9- increasing ITB pump today- and refilling it.   11/10- pump up to 540 mcg/day  11/14- wait on changing dose- discussed with pt x2 about if we increase ITB PUMP, will help spasticity pain, however will make her functionally weaker. Will d/w pt and husband again as well  11/15- has UTI- will see how things go - infection can increase spasticity  11/17- pt wants to decrease spasticity meds but increase other pain meds- explained not going to do this  11/19 She asks not to change  oral spasticity medicine dose today, She would like to try a lower pump dose, plans to discuss with Dr. Berline Chough tomorrow, will hold off on making adjustment today  11/20- will reduce dose tomorrow to prior dose.  11/23- reduced dose to 485 mcg and refill date 09/09/22- on IV solumedrol x 5 days-  9: GERD: restart Zantac 10: Neurogenic bladder with indwelling Foley; s/p UTI treatment with Zosyn 11: Bowel program: Nutrisource  fiber, psyllium caps, Colace, MiraLax, Senekot-S given daily             -placed PRN sorbitol and Fleets orders- has gotten really constipated/needing multiple enemas-   11/9- will increase miralax to BID x 3 days since increasing pump- no BM since admission to rehab 11/7  11/14- LBM x2 on 11/12- if no BM by tomorrow, will do Sorbitol and possibly SSE  11/15- medium-large BM today  11/17- if no BM by tomorrow- needs intervention  11/19 Sorbitol ordered  11/23- LBM yesterday 12: Dysphagia: ? resolved; now on regular diet; SLP eval 13: Seasonal allergies off home meds: monitor 14: Tooth abscess: missed follow-up appointment; completed Flagyl 15: Hypokalemia: on K+ supplement QOD; follow-up BMP tomorrow- likely due to previous Lasix dose for Edema.    11/8- K+ 4.1  11/14- K+ 4.3  Recheck tomorrow 16. Insomnia  11/9- will add Trazodone 50 mg QHS and can titrate up as required  11/10- increase to 150 mg QHS  11/14- will decrease to 100 mg QHS since having some confusion  11/17- having weird dreams but doesn't want to stop Trazodone- explained it's from trazodone.  17. Increasing confusion/STM memory, recall- UTI with E coli  11/14- pt asking [place] to check U/A And Cx- since feels getting a UTI- BP on low side as well- asymptomatic per pt  11/15- Pt has significant E Coli UTI- on Zosyn- will con't until sensitivities are back  11/16- Will change to Cefazolin per d/w pharmacy  Addendum- pt on IV Solumedrol 1000 mg- MRI is (-) for acute demyelination, however not sure what else to do with abrupt decline in function.  Pt reports that RLE is stronger and head is stronger- but confused- will give 1 more day and see if gets any difference in function.  Added PPI 40 mg BID since on IV solmedrol  I spent a total of  35  minutes on total care today- >50% coordination of care- due to prolonged time in room and d/w nursing   LOS: 16 days A FACE TO FACE EVALUATION WAS PERFORMED  Kawon Willcutt 04/19/2022, 9:00 AM

## 2022-04-19 NOTE — Plan of Care (Signed)
  Problem: SCI BOWEL ELIMINATION Goal: RH STG MANAGE BOWEL WITH ASSISTANCE Description: STG Manage Bowel with  mod I Assistance. Outcome: Not Progressing  Bowel program Problem: SCI BLADDER ELIMINATION Goal: RH STG MANAGE BLADDER WITH EQUIPMENT WITH ASSISTANCE Description: STG Manage Bladder With Equipment With min Assistance Outcome: Not Progressing; foley

## 2022-04-19 NOTE — Progress Notes (Signed)
Patient ID: Sharon Odom, female   DOB: 1970-09-02, 51 y.o.   MRN: [id number]  This SW covering for primary SW, Becky Dupree.   SW updated [facility] that pt is possibly placement, and will confirm discharge plan.   Cecile Sheerer, MSW, LCSWA Office: [phone] Cell: [phone] Fax: [phone]

## 2022-04-19 NOTE — Progress Notes (Signed)
Subjective: Patient evaluated at bedside.  Resting comfortably.  States that her strength and range of motion in her RLE has improved since starting steroid treatment.  There is more purposeful movement noted on both legs.  Objective: Current vital signs: BP 106/62 (BP Location: Right Arm)   Pulse 62   Temp 98.4 F (36.9 C) (Oral)   Resp 18   Ht 5\' 9"  (1.753 m)   Wt 56.2 kg   SpO2 98%   BMI 18.30 kg/m  Vital signs in last 24 hours: Temp:  [97.6 F (36.4 C)-98.4 F (36.9 C)] 98.4 F (36.9 C) (11/23 0551) Pulse Rate:  [62-82] 62 (11/23 0551) Resp:  [16-18] 18 (11/23 0551) BP: (106-130)/(62-74) 106/62 (11/23 0551) SpO2:  [97 %-99 %] 98 % (11/23 0551)  Intake/Output from previous day: 11/22 0701 - 11/23 0700 In: 464.5 [P.O.:360; IV Piggyback:104.5] Out: 650 [Urine:650] Intake/Output this shift: No intake/output data recorded. Nutritional status:  Diet Order             Diet regular Room service appropriate? Yes; Fluid consistency: Thin  Diet effective now                   Neurologic Exam: Physical Exam  General: Resting comfortably, not in acute distress HEENT-  Normocephalic, no lesions, without obvious abnormality.  Normal external eye and conjunctiva.   Lungs-no rhonchi or wheezing noted, no excessive working breathing.  Saturations within normal limits Extremities- Warm, dry and intact Musculoskeletal-no joint tenderness, deformity or swelling Skin-warm and dry, no hyperpigmentation, vitiligo, or suspicious lesions   Neurological Examination Mental Status: Alert, oriented, thought content appropriate.  Speech fluent without evidence of aphasia.  Able to follow 3 step commands without difficulty. Cranial Nerves: II: Discs flat bilaterally; Visual fields grossly normal,  III,IV, VI: ptosis not present, extra-ocular motions intact bilaterally pupils equal, dilated approx 8 mm and sluggishly reactive to light V,VII: smile symmetric, facial light touch sensation  normal bilaterally VIII: hearing normal bilaterally IX,X: uvula rises symmetrically XI: bilateral shoulder shrug, notable weakness at tilting of head towards right.  XII: midline tongue extension Motor: UE Strength               Right               Left Deltoids                       4/5                   2/5 Triceps                        4/5                   2/5 Biceps                         4/5                   2/5 Finger grip                   5/5                   3/5 LE Strength                Right               Left Hip  Flex                       4/5                   2/5 Knee flexed                 4/5                   2/5 Knee X                        4/5                   2/5 Dorsiflexion                 5/5                   2/5 Plantarflexion              5/5                   2/5   Tone and bulk:normal tone throughout; no atrophy noted Sensory: Pinprick and light touch intact throughout, bilaterally Deep Tendon Reflexes: 2+ and symmetric throughout Cerebellar: FNF: slowed and overshooting, limited by weakness bilaterally Rapid alternating movements: slowed Heel-to-shin: unable to perform Gait: deferred  Lab Results: No results found for this or any previous visit (from the past 48 hour(s)).  Recent Results (from the past 240 hour(s))  Remove and replace urinary cath (placed > 5 days) then obtain urine culture from new indwelling urinary catheter.     Status: Abnormal   Collection Time: 04/10/22 10:10 AM   Specimen: Urine, Catheterized  Result Value Ref Range Status   Specimen Description URINE, CATHETERIZED  Final   Special Requests   Final    NONE Performed at [facility], [address] [place], [place] [address]    Culture >=100,000 COLONIES/mL ESCHERICHIA COLI (A)  Final   Report Status 04/12/2022 FINAL  Final   Organism ID, Bacteria ESCHERICHIA COLI (A)  Final      Susceptibility   Escherichia coli - MIC*    AMPICILLIN 16 INTERMEDIATE  Intermediate     CEFAZOLIN <=4 SENSITIVE Sensitive     CEFEPIME <=0.12 SENSITIVE Sensitive     CEFTRIAXONE <=0.25 SENSITIVE Sensitive     CIPROFLOXACIN <=0.25 SENSITIVE Sensitive     GENTAMICIN <=1 SENSITIVE Sensitive     IMIPENEM <=0.25 SENSITIVE Sensitive     NITROFURANTOIN <=16 SENSITIVE Sensitive     TRIMETH/SULFA <=20 SENSITIVE Sensitive     AMPICILLIN/SULBACTAM 4 SENSITIVE Sensitive     PIP/TAZO <=4 SENSITIVE Sensitive     * >=100,000 COLONIES/mL ESCHERICHIA COLI    Lipid Panel No results for input(s): "CHOL", "TRIG", "HDL", "CHOLHDL", "VLDL", "LDLCALC" in the last 72 hours.  Studies/Results: MR CERVICAL SPINE W WO CONTRAST  Result Date: 04/18/2022 CLINICAL DATA:  Multiple sclerosis with left worse than right spasticity and worsening symptoms. EXAM: MRI CERVICAL AND THORACIC SPINE WITHOUT AND WITH CONTRAST TECHNIQUE: Multiplanar and multiecho pulse sequences of the cervical spine, to include the craniocervical junction and cervicothoracic junction, and the thoracic spine, were obtained without and with intravenous contrast. CONTRAST:  5mL GADAVIST GADOBUTROL 1 MMOL/ML IV SOLN COMPARISON:  Cervical and thoracic spine MRI 11/12/2021 FINDINGS: MRI CERVICAL SPINE FINDINGS Alignment: Normal. Vertebrae: Vertebral body heights are preserved. Background marrow signal is normal. There  is no suspicious marrow signal abnormality or marrow edema. There is no abnormal marrow enhancement. Benign intraosseous hemangiomas in the C7 and T1 vertebral bodies are unchanged. Cord: There is unchanged signal abnormality in the left dorsal aspect of the cord at C3-C4 (15-26). Previously seen signal abnormality in the right aspect of the cord at C6 is no longer appreciated there is no abnormal enhancement to suggest active demyelination. Posterior Fossa, vertebral arteries, paraspinal tissues: The posterior fossa is assessed on the separately dictated brain MRI. The vertebral artery flow voids are normal. The  paraspinal soft tissues are unremarkable. Disc levels: There is overall mild multilevel degenerative change of the cervical spine, stable since the prior study and described in detail on that report. There is up to mild spinal canal stenosis and moderate bilateral neural foraminal stenosis at C5-C6. MRI THORACIC SPINE FINDINGS Alignment:  Normal. Vertebrae: Vertebral body heights are preserved. Background marrow signal is normal. There is no suspicious marrow signal abnormality or marrow edema. There is no abnormal marrow enhancement. A few benign intraosseous hemangiomas are again seen. Cord: Evaluation of the cord is degraded by motion artifact. There is no definite convincing evidence of demyelination in the thoracic cord on the current study. Previously seen signal abnormality on the study from 06/18 23 May have been artifactual. Paraspinal and other soft tissues: Unremarkable. Disc levels: There is no disc herniation. There is no significant spinal canal or neural foraminal stenosis. IMPRESSION: 1. Small focus of cord signal abnormality at C3-C4 is unchanged, consistent with demyelinating disease. Additional previously seen signal abnormality in the cervical cord on the study from 11/12/2021 is not appreciated on the current study. 2. Previously seen signal abnormality in the thoracic cord on the study from 11/12/2021 is not seen on the current study and may have been artifactual. No convincing evidence of demyelinating disease in the thoracic cord. 3. No abnormal enhancement to suggest active demyelination. Electronically Signed   By: Lesia Hausen M.D.   On: 04/18/2022 11:57   MR THORACIC SPINE W WO CONTRAST  Result Date: 04/18/2022 CLINICAL DATA:  Multiple sclerosis with left worse than right spasticity and worsening symptoms. EXAM: MRI CERVICAL AND THORACIC SPINE WITHOUT AND WITH CONTRAST TECHNIQUE: Multiplanar and multiecho pulse sequences of the cervical spine, to include the craniocervical junction  and cervicothoracic junction, and the thoracic spine, were obtained without and with intravenous contrast. CONTRAST:  21mL GADAVIST GADOBUTROL 1 MMOL/ML IV SOLN COMPARISON:  Cervical and thoracic spine MRI 11/12/2021 FINDINGS: MRI CERVICAL SPINE FINDINGS Alignment: Normal. Vertebrae: Vertebral body heights are preserved. Background marrow signal is normal. There is no suspicious marrow signal abnormality or marrow edema. There is no abnormal marrow enhancement. Benign intraosseous hemangiomas in the C7 and T1 vertebral bodies are unchanged. Cord: There is unchanged signal abnormality in the left dorsal aspect of the cord at C3-C4 (15-26). Previously seen signal abnormality in the right aspect of the cord at C6 is no longer appreciated there is no abnormal enhancement to suggest active demyelination. Posterior Fossa, vertebral arteries, paraspinal tissues: The posterior fossa is assessed on the separately dictated brain MRI. The vertebral artery flow voids are normal. The paraspinal soft tissues are unremarkable. Disc levels: There is overall mild multilevel degenerative change of the cervical spine, stable since the prior study and described in detail on that report. There is up to mild spinal canal stenosis and moderate bilateral neural foraminal stenosis at C5-C6. MRI THORACIC SPINE FINDINGS Alignment:  Normal. Vertebrae: Vertebral body heights are preserved. Background marrow  signal is normal. There is no suspicious marrow signal abnormality or marrow edema. There is no abnormal marrow enhancement. A few benign intraosseous hemangiomas are again seen. Cord: Evaluation of the cord is degraded by motion artifact. There is no definite convincing evidence of demyelination in the thoracic cord on the current study. Previously seen signal abnormality on the study from 06/18 23 May have been artifactual. Paraspinal and other soft tissues: Unremarkable. Disc levels: There is no disc herniation. There is no significant  spinal canal or neural foraminal stenosis. IMPRESSION: 1. Small focus of cord signal abnormality at C3-C4 is unchanged, consistent with demyelinating disease. Additional previously seen signal abnormality in the cervical cord on the study from 11/12/2021 is not appreciated on the current study. 2. Previously seen signal abnormality in the thoracic cord on the study from 11/12/2021 is not seen on the current study and may have been artifactual. No convincing evidence of demyelinating disease in the thoracic cord. 3. No abnormal enhancement to suggest active demyelination. Electronically Signed   By: Lesia Hausen M.D.   On: 04/18/2022 11:57   MR BRAIN W WO CONTRAST  Result Date: 04/18/2022 CLINICAL DATA:  Multiple sclerosis with left worse than right spasticity following intrathecal baclofen pump. EXAM: MRI HEAD WITHOUT AND WITH CONTRAST TECHNIQUE: Multiplanar, multiecho pulse sequences of the brain and surrounding structures were obtained without and with intravenous contrast. CONTRAST:  5mL GADAVIST GADOBUTROL 1 MMOL/ML IV SOLN COMPARISON:  Brain MRI 03/26/2022 FINDINGS: Brain: Again seen are numerous foci of FLAIR signal abnormality in the juxtacortical, periventricular, and infratentorial white matter as well as the brainstem consistent with multiple sclerosis, overall unchanged since the MRI from 03/26/2022. There is no diffusion restriction or abnormal enhancement to suggest active demyelination. Previously seen enhancement of the lesion in the left occipital lobe has resolved. There is no acute intracranial hemorrhage, extra-axial fluid collection, or acute infarct. Background parenchymal volume is normal. The ventricles are normal in size. Gray-white differentiation is preserved There is no mass lesion.  There is no mass effect or midline shift. Vascular: Normal flow voids. Skull and upper cervical spine: Normal marrow signal. The cervical cord is assessed on the separately dictated MRI cervical spine.  Sinuses/Orbits: The paranasal sinuses are clear. The globes and orbits are unremarkable. The optic nerves are grossly unremarkable on these nondedicated sequences. Other: None. IMPRESSION: Extensive signal abnormality in the supratentorial and infratentorial brain and brainstem consistent with multiple sclerosis, similar to the prior study with no new lesion or evidence of active demyelination. Previously seen enhancement of the left occipital lobe lesion has resolved. Electronically Signed   By: Lesia Hausen M.D.   On: 04/18/2022 11:40    Medications: Prior to Admission:  Medications Prior to Admission  Medication Sig Dispense Refill Last Dose   albuterol (VENTOLIN HFA) 108 (90 Base) MCG/ACT inhaler Inhale 1-2 puffs into the lungs every 6 (six) hours as needed for wheezing or shortness of breath. 6.7 g 1    amantadine (SYMMETREL) 100 MG capsule Take 1 capsule (100 mg total) by mouth 3 (three) times daily. 60 capsule 0    antiseptic oral rinse (BIOTENE) LIQD 15 mLs by Mouth Rinse route as needed for dry mouth.      Ascorbic Acid (VITAMIN C) 1000 MG tablet Take 1,000 mg by mouth daily.      baclofen (LIORESAL) 20 MG tablet Take 2 tablets (40 mg total) by mouth daily at 6 (six) AM. 30 each 0    baclofen (LIORESAL) 20 MG tablet  Take 1 tablet (20 mg total) by mouth 2 (two) times daily. 30 each 0    [EXPIRED] baclofen (LIORESAL) 40 MG/20ML SOLN 40 mLs (80 mg total) by Intrathecal route once for 1 dose. 40 mL 0    bisacodyl (DULCOLAX) 10 MG suppository Place 1 suppository (10 mg total) rectally daily as needed for severe constipation. 12 suppository 0    buPROPion (WELLBUTRIN XL) 300 MG 24 hr tablet TAKE 1 TABLET DAILY 90 tablet 3    calcium carbonate (TUMS - DOSED IN MG ELEMENTAL CALCIUM) 500 MG chewable tablet Chew 1 tablet (200 mg of elemental calcium total) by mouth 3 (three) times daily as needed for indigestion or heartburn.      carboxymethylcellulose (REFRESH PLUS) 0.5 % SOLN Place 1 drop into both  eyes 3 (three) times daily as needed (dry eyes).      Cholecalciferol (VITAMIN D-3) 125 MCG (5000 UT) TABS Take 5,000 Units by mouth daily. 30 tablet 1    conjugated estrogens (PREMARIN) vaginal cream Place 1 Application vaginally 3 (three) times a week. Blueberry size amount for application (Patient not taking: Reported on 03/13/2022)      Cyanocobalamin (VITAMIN B-12) 2500 MCG SUBL Place 1 tablet (2,500 mcg total) under the tongue daily. 30 tablet 0    dalfampridine 10 MG TB12 Take 1 tablet (10 mg total) by mouth 2 (two) times daily. One po bid 180 tablet 3    diazepam (VALIUM) 5 MG tablet Take 1 tablet (5 mg total) by mouth every 8 (eight) hours. 30 tablet 0    Docusate Sodium (DSS) 100 MG CAPS Take by mouth.      Green Tea 150 MG CAPS Green Tea Complex  500 mg daily      naproxen (NAPROSYN) 500 MG tablet Take 1 tablet (500 mg total) by mouth 2 (two) times daily with a meal.      oxyCODONE 10 MG TABS Take 1 tablet (10 mg total) by mouth every 4 (four) hours as needed for moderate pain or severe pain. 30 tablet 0    polyethylene glycol (MIRALAX / GLYCOLAX) 17 g packet Take 17 g by mouth daily. 14 each 0    potassium chloride SA (KLOR-CON M) 20 MEQ tablet Take 1 tablet (20 mEq total) by mouth every other day while taking Lasix 15 tablet 0    Psyllium (METAMUCIL) 0.36 g CAPS Take 5 capsules by mouth daily.      senna-docusate (SENOKOT-S) 8.6-50 MG tablet Take 3 tablets by mouth daily.      tiZANidine (ZANAFLEX) 4 MG tablet TAKE 2 TABLETS THREE TIMES A DAY (Patient taking differently: Take 8 mg by mouth 3 (three) times daily.) 720 tablet 3    trospium (SANCTURA) 20 MG tablet Take 20 mg by mouth 2 (two) times daily.      Scheduled:  amantadine  100 mg Oral TID   vitamin C  1,000 mg Oral Daily   baclofen  20 mg Oral Q8H   buPROPion  300 mg Oral Daily   Chlorhexidine Gluconate Cloth  6 each Topical Q12H   cholecalciferol  5,000 Units Oral Daily   cyanocobalamin  2,500 mcg Oral Daily    dalfampridine  10 mg Oral BID   diazepam  5 mg Oral Q8H   docusate sodium  100 mg Oral QHS   enoxaparin (LOVENOX) injection  40 mg Subcutaneous Q24H   fiber  1 packet Oral Daily   naproxen  500 mg Oral BID WC   pantoprazole  40 mg Oral BID   polyethylene glycol  17 g Oral Daily   potassium chloride SA  20 mEq Oral QODAY   Psyllium  5 capsule Oral Daily   senna-docusate  2 tablet Oral Daily   tiZANidine  4 mg Oral BID   traZODone  100 mg Oral QHS   trospium  20 mg Oral BID    Assessment/Plan: 51 yo female with PMHx of secondary progressive MS admitted to CIR on 11/07 for increased spasticity and worsening mobility.  Patient had excision of baclofen in 12/2021 with 5% increase in baclofen on 03/02/2022.  During her hospitalization patient has developed progressive weakness of left arm and leg along and progression of weakness to her right upper and lower extremity.  She has had progressive vision changes as well as weakness since admission.  She did have some enhancement on her initial MRI, but was not felt to be contributing to her symptoms and therefore it was felt to be a pseudo exacerbation.  The fact that she has continued to worsen would argue at this point that she may benefit from salvage therapy with steroids.   Continue Solumedrol 1g daily IV,  11/21-11/25   Lorri Frederick, MD PGY-1  04/19/2022  8:49 AM  I have seen the patient reviewed the above note.  Imaging is negative, but she did have enhancement within the past month.  She subjectively feels she is getting some benefit some Solu-Medrol, will complete a course of this.  Ritta Slot, MD [facility] [phone]  If 7pm- 7am, please page neurology on call as listed in AMION.

## 2022-04-20 NOTE — Progress Notes (Signed)
Physical Therapy Session Note  Patient Details  Name: Sharon Odom MRN: [id number] Date of Birth: June 14, 1970  Today's Date: 04/20/2022 PT Individual Time: 0800-0905 PT Individual Time Calculation (min): 65 min   Short Term Goals: Week 2:  PT Short Term Goal 1 (Week 2): d/c date >7-10 days, STG=LTG due to ELOS  Skilled Therapeutic Interventions/Progress Updates:      Therapy Documentation Precautions:  Precautions Precautions: Fall Required Braces or Orthoses: Splint/Cast Splint/Cast: L resting hand splint Restrictions Weight Bearing Restrictions: No  Pt agreeable to Pt session with focus on bed mobility and transfer training. Pt requires mod/max A with rolling in bed and PT provided total A for lower body dressing for time management. Pt requires mod A for supine to sit and max A for static sitting balance at edge of bed. Pt requires max A with stand pivot transfer to PWC and mod/max A for upper body dressing in PWC. Pt left seated in PWC at bedside with all needs in reach. Pt without verbal complaints of pain and reports visual hallucinations in session, MD aware and present in session for rounding.    Therapy/Group: Individual Therapy  [name] [name] PT, DPT  04/20/2022, 7:37 AM

## 2022-04-20 NOTE — Progress Notes (Addendum)
Speech Language Pathology Daily Session Note  Patient Details  Name: Sharon Odom MRN: [id number] Date of Birth: [birth year]  Today's Date: 04/20/2022 SLP Individual Time: 0915-1000 SLP Individual Time Calculation (min): 45 min  Short Term Goals: Week 3: SLP Short Term Goal 1 (Week 3): STG=LTG due to ELOS 11/28  Skilled Therapeutic Interventions: S: Pt seen this date for skilled ST intervention targeting cognitive goals outlined in care plan. Pt received awake/alert and OOB in power w/c attempting to eat her breakfast. Agreeable to ST intervention in hospital room. Participatory with cues. No reports of visual hallucinations this sessions. Poor head control noted (head hanging down and to the R), with pt unable to correct.  O: Today's session with emphasis on functional recall, problem-solving, and attention within the context of ADL tasks. Pt required Max A for self-feeding with spoon place in R hand, stating that this is a new difficulty that has developed over the past week. Upon further inquiry, pt reports OT provided a built up handle for utensil use; however, is unable to locate. Required Min-Mod A verbal cues for attention to task and sequencing to complete toothbrushing. Remains dysarthric and benefits form minimizing environmental stimuli to aid in attention to task and speech intelligibility + comprehensibility. Min-Mod A verbal cues for sustained attention for 15-20 minutes. Total A for writing in memory notebook; Min-Mod A for recall of today's education (aspiration precautions) and therapy tasks. No clinical s/sx concerning for airway compromise were noted with PO intake to include medication pass (whole with puree) with LPN. Intake was noted to be <20%. Per chart review, pt appears to be only consuming ~25% of her meals, and demonstrated significant difficulty with self-feeding this date; notified interdisciplinary team via [place] chat. May benefit from RD consult depending on  admission vs current weight.  A: Pt appears somewhat sitmulable for skilled ST intervention. Recommend reviewing aspiration precautions with pt next date to see if she can recall and implement successfully. Perhaps trial applications (e.g. "Siri," etc) on pt's phone to increase her independence with making verbal messages/notes of her recent tasks and events. May be limited due to dysarthria, but may be worth exploring other alternatives.  P: Pt left in room with all safety measures activated. Call bell reviewed and within reach and all immediate needs met. Continue per current ST POC next session.   Pain None; NAD  Therapy/Group: Individual Therapy  Guled Gahan A Beila Purdie 04/20/2022, 1:11 PM

## 2022-04-20 NOTE — Progress Notes (Signed)
Occupational Therapy Weekly Progress Note  Patient Details  Name: Sharon Odom MRN: [id number] Date of Birth: 11/03/1970  Beginning of progress report period: April 12, 2022 End of progress report period: April 20, 2022  Today's Date: 04/20/2022 OT Individual Time: 1015-1115, 1400-1600 OT Individual Time Calculation (min): 60 min, 120 min (60 min make up minutes)   Patient has met 0 of 5 short term goal d/t STG=LTG's and LOS extended due to medical issues with UTI and MS exacerbation. Pt now on IV abx and improving somewhat strength-wise. ATP added hip guides and B lateral supports as per PT initiation with + results. Pt is having interference with vision d/t diplopia intermittently therefore modifications trialed with good results. Met with husband this session and both are hoping to make caregiver assistance plans for d/c home tentatively early next weekPt continues to require completion of CIR to maximize safety and function overall.   Patient continues to demonstrate the following deficits: muscle weakness, muscle joint tightness, and muscle paralysis, decreased cardiorespiratoy endurance, impaired timing and sequencing, abnormal tone, unbalanced muscle activation, and decreased coordination, diplopia and blurred vision, decreased awareness and memory,and decreased balance and activity tolerance and therefore will continue to benefit from skilled OT intervention to enhance overall performance with BADL, iADL, and Reduce care partner burden.  Patient progressing toward long term goals..  Continue plan of care.  OT Short Term Goals Week 3: STG=LTG's d/t LOS   Skilled Therapeutic Interventions/Progress Updates:   Session 1:  Pt seen for am session for skilled OT. Pt already up oob in power w/c and dressed. Requested assessment of spoon/fork to mouth skills as pt felt having more difficulty this am. OT agreed utensil to mouth was more effortful as pt leaning to r side and head  unsupported. OT noticed that neither lateral supports were engaged thus once engaged and new nexk roll applied pt was able to be successful. OT also obtained a foam built up handle roll for spoon and fork and instructed in use as pt does have built up handles at home. Pt's husband brought in suction cup reacher and OT training in use to gather and organize ADL supplies using R hand on reacher with min A for light items. Left pt up in pw with all needs and nurse call button in reach.     Session 2:   Pt with extended session d/t missed therapy Wed for MRI. Pt was completing being started on IV medication as previous IV needed replacing. OT was able to transport IV pole into day room while pt operated power w/c with min cues and S to navigate. + 2 assistance available thus OT placed large checker board game on table and pt stood 2 trials of 2 minutes with max A for support with B knees flexed and R head tilt. Pt was able to make several moves of game with R UE while L UE was in weightbearing position. Once seated, OT addressed diplopia via lens taping technique on nasal portion of non-dominant eye. + results ensued however nystagmus and blurred vision persisted. Pt educ on purpose of technique. Will tape her prescription glasses Monday if temporary lenses are effective. Pt completed 2 sets of 1 lb weight for R UE for scap, sh, elbow and trunk with 10 reps in each pance and rest in between. Issued light-medium foam grap cube for B hand grasp and instructed in use for between sessions with + teach back. Back in room, husband arrived. See above for discussion details.  Left pt in company of spouse with nurse call button and needs in reach.   Therapy Documentation Precautions:  Precautions Precautions: Fall Required Braces or Orthoses: Splint/Cast Splint/Cast: L resting hand splint Restrictions Weight Bearing Restrictions: No    Therapy/Group: Individual Therapy  Barnabas Lister 04/20/2022, 7:13 AM

## 2022-04-20 NOTE — Progress Notes (Signed)
Patient still in pain before able to get q6 pain meds. Repositioning patient to help relieve pressure.

## 2022-04-20 NOTE — Progress Notes (Signed)
PROGRESS NOTE   Subjective/Complaints:  Pt reports no change in visual Sx's, and still somewhat confused, but recognizes it.   Moving RLE much more And a little more movement of LLE.      ROS:  Pt denies SOB, abd pain, CP, N/V/C/D, and (+) vision changes  Except for HPI  Objective:   No results found. No results for input(s): "WBC", "HGB", "HCT", "PLT" in the last 72 hours.   No results for input(s): "NA", "K", "CL", "CO2", "GLUCOSE", "BUN", "CREATININE", "CALCIUM" in the last 72 hours.    Intake/Output Summary (Last 24 hours) at 04/20/2022 1210 Last data filed at 04/20/2022 0526 Gross per 24 hour  Intake 360 ml  Output 1500 ml  Net -1140 ml     Pressure Injury 03/31/22 Sacrum Medial Deep Tissue Pressure Injury - Purple or maroon localized area of discolored intact skin or blood-filled blister due to damage of underlying soft tissue from pressure and/or shear. (Active)  03/31/22 0815  Location: Sacrum  Location Orientation: Medial  Staging: Deep Tissue Pressure Injury - Purple or maroon localized area of discolored intact skin or blood-filled blister due to damage of underlying soft tissue from pressure and/or shear.  Wound Description (Comments):   Present on Admission: Yes     Pressure Injury 04/03/22 Thigh Left;Posterior;Proximal Stage 2 -  Partial thickness loss of dermis presenting as a shallow open injury with a red, pink wound bed without slough. red pink (Active)  04/03/22 1751  Location: Thigh  Location Orientation: Left;Posterior;Proximal  Staging: Stage 2 -  Partial thickness loss of dermis presenting as a shallow open injury with a red, pink wound bed without slough.  Wound Description (Comments): red pink  Present on Admission: Yes    Physical Exam: Vital Signs Blood pressure 126/73, pulse 62, temperature 97.6 F (36.4 C), temperature source Oral, resp. rate 18, height 5\' 9"  (1.753 m),  weight 56.2 kg, SpO2 98 %.           General: awake, alert, appropriate, sitting up in w/c- head all the way to R- needs better head support on w/c; NAD HENT: conjugate gaze; oropharynx moist CV: regular rate; no JVD Pulmonary: CTA B/L; no W/R/R- good air movement GI: soft, NT, ND, (+)BS- ITB pump in place on R side Psychiatric: appropriate Neurological: Ox3- but still extremely dysarthric and almost aphonic- is worse than originally, but better than 2 days ago  LLE- back to baseline  Skin:    General: Skin is warm and dry.     Comments: New DTI on coccyx  Also worsening skin open/pressure Stage II on posterior thigh- on R thigh Also small DTI on R heel  Neurological:     Mental Status: She is alert.     Comments: MAS 2 to 3 RLE, MAS 1+ LLE  Motor 5/5 RUE 3+ LUE RLE- HF/KE/DF and PF 4-/5- was 2/5 LLE- HF 1 to 2-/5; otherwise 2+/5 which is better than 1/5  MS: Head lolling somewhat to right, but able to bring back to midline without assistance  Assessment/Plan: 1. Functional deficits which require 3+ hours per day of interdisciplinary therapy in a comprehensive inpatient rehab setting. Physiatrist is providing  close team supervision and 24 hour management of active medical problems listed below. Physiatrist and rehab team continue to assess barriers to discharge/monitor patient progress toward functional and medical goals  Care Tool:  Bathing    Body parts bathed by patient: Left arm, Chest, Abdomen, Front perineal area, Buttocks, Right upper leg, Left upper leg, Face, Right arm   Body parts bathed by helper: Right lower leg, Left lower leg     Bathing assist Assist Level: Moderate Assistance - Patient 50 - 74%     Upper Body Dressing/Undressing Upper body dressing   What is the patient wearing?: Pull over shirt, Bra    Upper body assist Assist Level: Minimal Assistance - Patient > 75%    Lower Body Dressing/Undressing Lower body dressing      What is the  patient wearing?: Underwear/pull up, Pants     Lower body assist Assist for lower body dressing: Moderate Assistance - Patient 50 - 74%     Toileting Toileting Toileting Activity did not occur (Clothing management and hygiene only): N/A (no void or bm)  Toileting assist Assist for toileting: Moderate Assistance - Patient 50 - 74%     Transfers Chair/bed transfer  Transfers assist     Chair/bed transfer assist level: Maximal Assistance - Patient 25 - 49%     Locomotion Ambulation   Ambulation assist   Ambulation activity did not occur: Safety/medical concerns (unable to perform due to pain, weakness and decreased activity tolerance)          Walk 10 feet activity   Assist  Walk 10 feet activity did not occur: Safety/medical concerns (unable to perform due to pain, weakness and decreased activity tolerance)        Walk 50 feet activity   Assist Walk 50 feet with 2 turns activity did not occur: Safety/medical concerns (unable to perform due to pain, weakness and decreased activity tolerance)         Walk 150 feet activity   Assist Walk 150 feet activity did not occur: Safety/medical concerns (unable to perform due to pain, weakness and decreased activity tolerance)         Walk 10 feet on uneven surface  activity   Assist Walk 10 feet on uneven surfaces activity did not occur: Safety/medical concerns (unable to perform due to pain, weakness and decreased activity tolerance)         Wheelchair     Assist Is the patient using a wheelchair?: Yes Type of Wheelchair: Power    Wheelchair assist level: Supervision/Verbal cueing Max wheelchair distance: 300 ft    Wheelchair 50 feet with 2 turns activity    Assist        Assist Level: Supervision/Verbal cueing   Wheelchair 150 feet activity     Assist      Assist Level: Supervision/Verbal cueing   Blood pressure 126/73, pulse 62, temperature 97.6 F (36.4 C), temperature  source Oral, resp. rate 18, height 5\' 9"  (1.753 m), weight 56.2 kg, SpO2 98 %.  Medical Problem List and Plan: 1. Functional deficits due to secondary progressive multiple sclerosis and debility from prolonged admission on acute floor for encephalopathy             -patient may  shower             -ELOS/Goals: min A to supervision- 7-10 days  D/c 11/28  Con't CIR- PT, OT and SLP  -She would like to f/u Palliative 12/28 to continue discussion,  consider reaching out Monday  Con't CIR- PT, OT and SLP- will get headrest with more support-has lateral supports now which are helpful-   2.  Antithrombotics: -DVT/anticoagulation:  Pharmaceutical: Lovenox             -antiplatelet therapy: none 3. Pain Management:              -Naproxen 500 mg twice daily             -Tylenol as needed        will change to Nucynta 75 mg q6 hours - will not add Methadone due to chance of polypharmacy   11/14- con't Nucynta- since change didn't increase dose went from 50 mg q4 to 75 mg q6 hours prn-did decrease Am baclofen to 20 mg- con't other doses at 20 mg and reduced doses of zanaflex-tomorrow, if no other issues, will reduce Valium to 2.5 mg q8 hours    11/16- Has UTI- discussed with pt Oxy vs Nucynta- I will not increase nucynta higher dose - but can change back to Oxycodone 10 mg q4 or 15 mg q6- I don't feel comfortable with 15 mg q4 hours  11/17- will increase tylenol to 1000 mg but max 3x/day- DO NOT increase pain meds over weekend, esp with lower BP lately.   11/20- will decrease ITB pump tomorrow and do trigger point injections of shoulders/upper back/neck for pain control 11/23- TrP injections done 11/21- didn't help pain in head/neck- con't regimen for now 4. Mood/Behavior/Sleep: LCSW to evaluate and provide emotional support             -antipsychotic agents: n/a             -Wellbutrin XL 300 mg daily             -amantadine 100 mg TID- per Dr Epimenio Foot 5. Neuropsych/cognition: This patient is  capable of making decisions on her own behalf. 6. Skin/Wound Care: Routine skin care checks  11/7- has new DTI on coccyx, worsening now stage II on posterior R thigh and small DTI R heel- added prevalon boots in bed; pt will NOT use air mattress- changing to regular bed due to pt insistence; and foam dressing and turn q2 hours.   11/9- pt doesn't like prevalon boots, but educated on the importance 7. Fluids/Electrolytes/Nutrition: Routine Is and Os and follow-up chemistries             -continue Vitamin C, D3, B12 and fiber supplements 8: MS with spasticity: continue dalfampridine BID and [place]              -Baclofen 20 mg twice daily, 40 mg every morning             -Valium 5 mg every 8 hours             -Zanaflex 4 mg twice daily- were reduced due to cognition/sedation on acute- will change out /refill pump Thursday as well as increase dosing of ITB pump  11/8- spasticity slightly more than normal, however increasing ITB pump tomorrow, so will not increase spasticity PO meds  11/9- increasing ITB pump today- and refilling it.   11/10- pump up to 540 mcg/day  11/14- wait on changing dose- discussed with pt x2 about if we increase ITB PUMP, will help spasticity pain, however will make her functionally weaker. Will d/w pt and husband again as well  11/15- has UTI- will see how things go - infection can increase spasticity  11/17- pt wants  to decrease spasticity meds but increase other pain meds- explained not going to do this  11/19 She asks not to change oral spasticity medicine dose today, She would like to try a lower pump dose, plans to discuss with Dr. Berline Chough tomorrow, will hold off on making adjustment today  11/20- will reduce dose tomorrow to prior dose.  11/23- reduced dose to 485 mcg and refill date 09/09/22- on IV solumedrol x 5 days- 11/24- finish IV Solumedrol on 11/25  9: GERD: restart Zantac 10: Neurogenic bladder with indwelling Foley; s/p UTI treatment with Zosyn 11: Bowel  program: Nutrisource fiber, psyllium caps, Colace, MiraLax, Senekot-S given daily             -placed PRN sorbitol and Fleets orders- has gotten really constipated/needing multiple enemas-   11/9- will increase miralax to BID x 3 days since increasing pump- no BM since admission to rehab 11/7  11/14- LBM x2 on 11/12- if no BM by tomorrow, will do Sorbitol and possibly SSE  11/15- medium-large BM today  11/17- if no BM by tomorrow- needs intervention  11/19 Sorbitol ordered  11/23- LBM yesterday  11/24- LBM last night 12: Dysphagia: ? resolved; now on regular diet; SLP eval 13: Seasonal allergies off home meds: monitor 14: Tooth abscess: missed follow-up appointment; completed Flagyl 15: Hypokalemia: on K+ supplement QOD; follow-up BMP tomorrow- likely due to previous Lasix dose for Edema.    11/8- K+ 4.1  11/14- K+ 4.3  Recheck tomorrow 16. Insomnia  11/9- will add Trazodone 50 mg QHS and can titrate up as required  11/10- increase to 150 mg QHS  11/14- will decrease to 100 mg QHS since having some confusion  11/17- having weird dreams but doesn't want to stop Trazodone- explained it's from trazodone.  17. - UTI with E coli  11/14- pt asking [place] to check U/A And Cx- since feels getting a UTI- BP on low side as well- asymptomatic per pt  11/15- Pt has significant E Coli UTI- on Zosyn- will con't until sensitivities are back  11/16- Will change to Cefazolin per d/w pharmacy 18. Confusion/ progressive weakness- due to high dose IV steroids  11/24- finishing steroids tomorrow- Neuro was OK with her continuing since has more strength in RLE and more movement also in LLE  -also needs head support and lateral supports for w/c- PT will address getting for pt.     I spent a total of  38  minutes on total care today- >50% coordination of care- due to d/w PT about care as well as reviewing notes and d/w nursing about pain meds  LOS: 17 days A FACE TO FACE EVALUATION WAS PERFORMED  Sharon Odom 04/20/2022, 12:10 PM

## 2022-04-21 MED ORDER — QUETIAPINE FUMARATE 25 MG PO TABS
25.0000 mg | ORAL_TABLET | Freq: Once | ORAL | Status: AC
  Administered 2022-04-21: 25 mg via ORAL
  Filled 2022-04-21: qty 1

## 2022-04-21 NOTE — Progress Notes (Signed)
Speech Language Pathology Daily Session Note  Patient Details  Name: Korissa Horsford MRN: [id number] Date of Birth: 11-12-1970  Today's Date: 04/21/2022 SLP Individual Time: 1300-1400 SLP Individual Time Calculation (min): 60 min  Short Term Goals: Week 3: SLP Short Term Goal 1 (Week 3): STG=LTG due to ELOS 11/28  Skilled Therapeutic Interventions: Pt seen for skilled ST with focus on cognitive goals, pt in wheelchair with lunch meal present and agreeable to therapeutic tasks. Pt directing SLP to memory notebook and SLP completing with patient dictating events of AM OT session with min cues. SLP facilitating recall of functional information by providing min A cues for accuracy (pt able to discuss in detail current medication regime, recent dx's and complications, etc). Pt benefits from overall min A cues for sustained attention to task for 25-30 minutes, appears to have cognitive clearing in recent day s/p UTI dx. Pt is aware of potential d/c to SNF environment. Pt did require frequent cues for breath support to increase vocal intensity/speech intelligibility. Pt intelligibility <50% in very quiet environment. Pt left in wheelchair with all needs met, cont ST POC.   Pain Pain Assessment Pain Scale: 0-10 Pain Score: 0-No pain  Therapy/Group: Individual Therapy  Dewaine Conger 04/21/2022, 2:02 PM

## 2022-04-21 NOTE — Progress Notes (Signed)
Subjective: She feels like her strength is improving, but has been having some unusual symptoms.  She states that she has some visual distortions, when she looks at the wall she sees correct wallpaper, and when she closes her eyes she states she "goes into an instant dream seeing stories"  Exam: Vitals:   04/21/22 0427 04/21/22 1449  BP: 121/82 120/81  Pulse: 79 66  Resp: 18 17  Temp: 98.1 F (36.7 C) 97.6 F (36.4 C)  SpO2: 99% 100%   Gen: In bed, NAD Resp: non-labored breathing, no acute distress Abd: soft, nt  Neuro: MS: Awake, alert, oriented CN: Visual fields full Motor: Her EEG does reveal that she is in she does have some improvement in her strength, her right side is still better than her left, 3/5 of the left arm and leg Sensory: Intact to light touch    Impression: 51 year old female with a history of MS who presented with worsening weakness following several issues with her pump.  She did have enhancement on a previous MRI with this hospitalization, and given this coupled with her weakness felt like it was worthwhile to try pursuing steroids.  She had her baclofen pump reduced the same day she started steroids, and her strength is improved, suspect a combination of the above.  She does appear to have some symptoms that could be consistent with mild delirium which I suspect could have been contributed to by the steroids.  She also had recent UTI, and it may be reasonable to repeat UA.  I will give her a dose of Seroquel tonight to see if that provides her benefit.  Recommendations: 1) Seroquel 25 mg at 8 PM 2) continue therapy 3) urinalysis 4) will follow  Ritta Slot, MD [facility] [phone]  If 7pm- 7am, please page neurology on call as listed in [facility].

## 2022-04-21 NOTE — Progress Notes (Signed)
PROGRESS NOTE   Subjective/Complaints:  Vitals stable. No events overnight. LBM 11/23. C/o swelling in BL LE, L>R, legs tender to palpation but not moreso than usual.    ROS:  Pt denies SOB, abd pain, CP, N/V/C/D, or vision changes.   Except for HPI  Objective:   No results found. No results for input(s): "WBC", "HGB", "HCT", "PLT" in the last 72 hours.   No results for input(s): "NA", "K", "CL", "CO2", "GLUCOSE", "BUN", "CREATININE", "CALCIUM" in the last 72 hours.    Intake/Output Summary (Last 24 hours) at 04/21/2022 0825 Last data filed at 04/21/2022 0424 Gross per 24 hour  Intake 120 ml  Output 700 ml  Net -580 ml      Pressure Injury 03/31/22 Sacrum Medial Deep Tissue Pressure Injury - Purple or maroon localized area of discolored intact skin or blood-filled blister due to damage of underlying soft tissue from pressure and/or shear. (Active)  03/31/22 0815  Location: Sacrum  Location Orientation: Medial  Staging: Deep Tissue Pressure Injury - Purple or maroon localized area of discolored intact skin or blood-filled blister due to damage of underlying soft tissue from pressure and/or shear.  Wound Description (Comments):   Present on Admission: Yes     Pressure Injury 04/03/22 Thigh Left;Posterior;Proximal Stage 2 -  Partial thickness loss of dermis presenting as a shallow open injury with a red, pink wound bed without slough. red pink (Active)  04/03/22 1751  Location: Thigh  Location Orientation: Left;Posterior;Proximal  Staging: Stage 2 -  Partial thickness loss of dermis presenting as a shallow open injury with a red, pink wound bed without slough.  Wound Description (Comments): red pink  Present on Admission: Yes    Physical Exam: Vital Signs Blood pressure 121/82, pulse 79, temperature 98.1 F (36.7 C), temperature source Oral, resp. rate 18, height 5\' 9"  (1.753 m), weight 56.2 kg, SpO2 99  %.  Constitution: Appropriate appearance for age. No apparent distress   HEENT: PERRL, EOMI grossly intact. +glasses.  Resp: CTAB. No rales, rhonchi, or wheezing. Cardio: RRR. No mumurs, rubs, or gallops. L>R 1+ pitting edema in feet Abdomen: Nondistended. Nontender. +bowel sounds. Psych: Appropriate mood and affect.  Neuro: AAOx4. +Dysarthria. Increased tone in bilateral Les, MAS 2 MSK: Moving all 4 extremities in bed, BL UE antigravity.  Skin: redness along chest, without urticaria, bleeding, or pustules.   PE from prior encounter:   General: awake, alert, appropriate, sitting up in w/c- head all the way to R- needs better head support on w/c; NAD HENT: conjugate gaze; oropharynx moist CV: regular rate; no JVD Pulmonary: CTA B/L; no W/R/R- good air movement GI: soft, NT, ND, (+)BS- ITB pump in place on R side Psychiatric: appropriate Neurological: Ox3- but still extremely dysarthric and almost aphonic- is worse than originally, but better than 2 days ago  LLE- back to baseline  Skin:    General: Skin is warm and dry.     Comments: New DTI on coccyx  Also worsening skin open/pressure Stage II on posterior thigh- on R thigh Also small DTI on R heel  Neurological:     Mental Status: She is alert.     Comments: MAS  2 to 3 RLE, MAS 1+ LLE  Motor 5/5 RUE 3+ LUE RLE- HF/KE/DF and PF 4-/5- was 2/5 LLE- HF 1 to 2-/5; otherwise 2+/5 which is better than 1/5  MS: Head lolling somewhat to right, but able to bring back to midline without assistance  Assessment/Plan: 1. Functional deficits which require 3+ hours per day of interdisciplinary therapy in a comprehensive inpatient rehab setting. Physiatrist is providing close team supervision and 24 hour management of active medical problems listed below. Physiatrist and rehab team continue to assess barriers to discharge/monitor patient progress toward functional and medical goals  Care Tool:  Bathing    Body parts bathed by patient:  Left arm, Chest, Abdomen, Front perineal area, Buttocks, Right upper leg, Left upper leg, Face, Right arm   Body parts bathed by helper: Right lower leg, Left lower leg     Bathing assist Assist Level: Moderate Assistance - Patient 50 - 74%     Upper Body Dressing/Undressing Upper body dressing   What is the patient wearing?: Pull over shirt, Bra    Upper body assist Assist Level: Moderate Assistance - Patient 50 - 74%    Lower Body Dressing/Undressing Lower body dressing      What is the patient wearing?: Underwear/pull up, Pants     Lower body assist Assist for lower body dressing: Moderate Assistance - Patient 50 - 74%     Toileting Toileting Toileting Activity did not occur (Clothing management and hygiene only): N/A (no void or bm)  Toileting assist Assist for toileting: Moderate Assistance - Patient 50 - 74%     Transfers Chair/bed transfer  Transfers assist     Chair/bed transfer assist level: Maximal Assistance - Patient 25 - 49%     Locomotion Ambulation   Ambulation assist   Ambulation activity did not occur: Safety/medical concerns (unable to perform due to pain, weakness and decreased activity tolerance)          Walk 10 feet activity   Assist  Walk 10 feet activity did not occur: Safety/medical concerns (unable to perform due to pain, weakness and decreased activity tolerance)        Walk 50 feet activity   Assist Walk 50 feet with 2 turns activity did not occur: Safety/medical concerns (unable to perform due to pain, weakness and decreased activity tolerance)         Walk 150 feet activity   Assist Walk 150 feet activity did not occur: Safety/medical concerns (unable to perform due to pain, weakness and decreased activity tolerance)         Walk 10 feet on uneven surface  activity   Assist Walk 10 feet on uneven surfaces activity did not occur: Safety/medical concerns (unable to perform due to pain, weakness and decreased  activity tolerance)         Wheelchair     Assist Is the patient using a wheelchair?: Yes Type of Wheelchair: Power    Wheelchair assist level: Supervision/Verbal cueing Max wheelchair distance: 300 ft    Wheelchair 50 feet with 2 turns activity    Assist        Assist Level: Supervision/Verbal cueing   Wheelchair 150 feet activity     Assist      Assist Level: Supervision/Verbal cueing   Blood pressure 121/82, pulse 79, temperature 98.1 F (36.7 C), temperature source Oral, resp. rate 18, height 5\' 9"  (1.753 m), weight 56.2 kg, SpO2 99 %.  Medical Problem List and Plan: 1. Functional deficits due  to secondary progressive multiple sclerosis and debility from prolonged admission on acute floor for encephalopathy             -patient may  shower             -ELOS/Goals: min A to supervision- 7-10 days  D/c 11/28  Con't CIR- PT, OT and SLP  -She would like to f/u Palliative Leeroy Bock to continue discussion, consider reaching out Monday  Con't CIR- PT, OT and SLP- will get headrest with more support-has lateral supports now which are helpful-   2.  Antithrombotics: -DVT/anticoagulation:  Pharmaceutical: Lovenox             -antiplatelet therapy: none  - TEDs for BL LE swelling  3. Pain Management:              -Naproxen 500 mg twice daily             -Tylenol as needed        will change to Nucynta 75 mg q6 hours - will not add Methadone due to chance of polypharmacy   11/14- con't Nucynta- since change didn't increase dose went from 50 mg q4 to 75 mg q6 hours prn-did decrease Am baclofen to 20 mg- con't other doses at 20 mg and reduced doses of zanaflex-tomorrow, if no other issues, will reduce Valium to 2.5 mg q8 hours    11/16- Has UTI- discussed with pt Oxy vs Nucynta- I will not increase nucynta higher dose - but can change back to Oxycodone 10 mg q4 or 15 mg q6- I don't feel comfortable with 15 mg q4 hours  11/17- will increase tylenol to 1000 mg  but max 3x/day- DO NOT increase pain meds over weekend, esp with lower BP lately.   11/20- will decrease ITB pump tomorrow and do trigger point injections of shoulders/upper back/neck for pain control 11/23- TrP injections done 11/21- didn't help pain in head/neck- con't regimen for now 4. Mood/Behavior/Sleep: LCSW to evaluate and provide emotional support             -antipsychotic agents: n/a             -Wellbutrin XL 300 mg daily             -amantadine 100 mg TID- per Dr Epimenio Foot 5. Neuropsych/cognition: This patient is capable of making decisions on her own behalf. 6. Skin/Wound Care: Routine skin care checks  11/7- has new DTI on coccyx, worsening now stage II on posterior R thigh and small DTI R heel- added prevalon boots in bed; pt will NOT use air mattress- changing to regular bed due to pt insistence; and foam dressing and turn q2 hours.   11/9- pt doesn't like prevalon boots, but educated on the importance 7. Fluids/Electrolytes/Nutrition: Routine Is and Os and follow-up chemistries             -continue Vitamin C, D3, B12 and fiber supplements 8: MS with spasticity: continue dalfampridine BID and [place]              -Baclofen 20 mg twice daily, 40 mg every morning             -Valium 5 mg every 8 hours             -Zanaflex 4 mg twice daily- were reduced due to cognition/sedation on acute- will change out /refill pump Thursday as well as increase dosing of ITB pump  11/8- spasticity slightly more than normal, however  increasing ITB pump tomorrow, so will not increase spasticity PO meds  11/9- increasing ITB pump today- and refilling it.   11/10- pump up to 540 mcg/day  11/14- wait on changing dose- discussed with pt x2 about if we increase ITB PUMP, will help spasticity pain, however will make her functionally weaker. Will d/w pt and husband again as well  11/15- has UTI- will see how things go - infection can increase spasticity  11/17- pt wants to decrease spasticity meds but  increase other pain meds- explained not going to do this  11/19 She asks not to change oral spasticity medicine dose today, She would like to try a lower pump dose, plans to discuss with Dr. Berline Chough tomorrow, will hold off on making adjustment today  11/20- will reduce dose tomorrow to prior dose.  11/23- reduced dose to 485 mcg and refill date 09/09/22- on IV solumedrol x 5 days- 11/24- finish IV Solumedrol on 11/25   9: GERD: restart Zantac 10: Neurogenic bladder with indwelling Foley; s/p UTI treatment with Zosyn 11: Bowel program: Nutrisource fiber, psyllium caps, Colace, MiraLax, Senekot-S given daily             -placed PRN sorbitol and Fleets orders- has gotten really constipated/needing multiple enemas-   11/9- will increase miralax to BID x 3 days since increasing pump- no BM since admission to rehab 11/7  11/14- LBM x2 on 11/12- if no BM by tomorrow, will do Sorbitol and possibly SSE  11/15- medium-large BM today  11/17- if no BM by tomorrow- needs intervention  11/19 Sorbitol ordered  11/23- LBM yesterday  11/24- LBM last night  12: Dysphagia: ? resolved; now on regular diet; SLP eval 13: Seasonal allergies off home meds: monitor 14: Tooth abscess: missed follow-up appointment; completed Flagyl 15: Hypokalemia: on K+ supplement QOD; follow-up BMP tomorrow- likely due to previous Lasix dose for Edema.    11/8- K+ 4.1  11/14- K+ 4.3  Recheck tomorrow  16. Insomnia  11/9- will add Trazodone 50 mg QHS and can titrate up as required  11/10- increase to 150 mg QHS  11/14- will decrease to 100 mg QHS since having some confusion  11/17- having weird dreams but doesn't want to stop Trazodone- explained it's from trazodone.  17. - UTI with E coli  11/14- pt asking [place] to check U/A And Cx- since feels getting a UTI- BP on low side as well- asymptomatic per pt  11/15- Pt has significant E Coli UTI- on Zosyn- will con't until sensitivities are back  11/16- Will change to Cefazolin per  d/w pharmacy  18. Confusion/ progressive weakness- due to high dose IV steroids  11/24- finishing steroids tomorrow- Neuro was OK with her continuing since has more strength in RLE and more movement also in LLE  -also needs head support and lateral supports for w/c- PT will address getting for pt.    LOS: 18 days A FACE TO FACE EVALUATION WAS PERFORMED  Angelina Sheriff 04/21/2022, 8:25 AM

## 2022-04-21 NOTE — Progress Notes (Signed)
Occupational Therapy Session Note  Patient Details  Name: Sharon Odom MRN: [id number] Date of Birth: 04-18-1971  Today's Date: 04/21/2022 OT Individual Time: 0920-1010 OT Individual Time Calculation (min): 50 min    Short Term Goals: Week 3:  OT Short Term Goal 1 (Week 3): STG=LTG d/t ELOS  Skilled Therapeutic Interventions/Progress Updates:    Patient received supine (windswept) in bed.  Patient completing oral care.  Assisted patient to finalize grooming tasks - min assist.  Placed knee high Ted hose and leg warmers on patient.  Assisted patient to edge of bed - max assist, then transferred via Stedy to power chair.  Patient able to assist with trunk control, and partial sit to stand for transfer.  Patient used power chair features and mod assist to adjust hips in chair.  Max assist to don sweater due to time constraints.   Patient left up in wheelchair per her request with personal items nearby.    Therapy Documentation Precautions:  Precautions Precautions: Fall Required Braces or Orthoses: Splint/Cast Splint/Cast: L resting hand splint Restrictions Weight Bearing Restrictions: No   Pain:  Denies pain    Therapy/Group: Individual Therapy  Collier Salina 04/21/2022, 11:56 AM

## 2022-04-22 LAB — URINALYSIS, ROUTINE W REFLEX MICROSCOPIC
Bilirubin Urine: NEGATIVE
Glucose, UA: NEGATIVE mg/dL
Ketones, ur: NEGATIVE mg/dL
Nitrite: NEGATIVE
Protein, ur: 100 mg/dL — AB
RBC / HPF: >50 RBC/hpf — ABNORMAL HIGH (ref 0–5)
Specific Gravity, Urine: 1.025 (ref 1.005–1.030)
WBC, UA: >50 WBC/hpf — ABNORMAL HIGH (ref 0–5)
pH: 5 (ref 5.0–8.0)

## 2022-04-22 MED ORDER — QUETIAPINE FUMARATE 50 MG PO TABS
50.0000 mg | ORAL_TABLET | Freq: Every day | ORAL | Status: AC
  Administered 2022-04-22 – 2022-04-23 (×2): 50 mg via ORAL
  Filled 2022-04-22 (×2): qty 1

## 2022-04-22 NOTE — Progress Notes (Signed)
Subjective: She does not feel like she got much sleep last night, still having occasional visual disturbances  Exam: Vitals:   04/21/22 2008 04/22/22 0642  BP: 112/65 115/72  Pulse: 73 76  Resp: 20 20  Temp: 98.2 F (36.8 C) 98.4 F (36.9 C)  SpO2: 99% 98%   Gen: In bed, NAD Resp: non-labored breathing, no acute distress Abd: soft, nt  Neuro: MS: Awake, alert, oriented CN: Visual fields full Motor: Her EEG does reveal that she is in she does have some improvement in her strength, her right side is still better than her left, 3/5 of the left arm and 1-2/5 left leg Sensory: Intact to light touch    Impression: 51 year old female with a history of MS who presented with worsening weakness following several issues with her pump.  She did have enhancement on a previous MRI with this hospitalization, and given this coupled with her weakness felt like it was worthwhile to try pursuing steroids.  She had her baclofen pump reduced the same day she started steroids, and her strength is improved, suspect a combination of the above.  She does appear to have some symptoms that could be consistent with mild delirium which I suspect could have been contributed to by the steroids.  She also had recent UTI, and it may be reasonable to repeat UA.  I will increase her Seroquel tonight.  Recommendations: 1) Seroquel 50 mg nightly for a few nights to try and help reestablish sleep and delirium 2) continue therapy 3) further evaluation, consideration of management for UTI per medical team 4) neurology will be available on an as-needed basis moving forward, please call with further questions or concerns.  Ritta Slot, MD [facility] [phone]  If 7pm- 7am, please page neurology on call as listed in AMION.

## 2022-04-22 NOTE — Progress Notes (Signed)
Speech Language Pathology Daily Session Note  Patient Details  Name: Sharon Odom MRN: [id number] Date of Birth: June 25, 1970  Today's Date: 04/22/2022 SLP Individual Time: 2130-8657 SLP Individual Time Calculation (min): 30 min  Short Term Goals: Week 3: SLP Short Term Goal 1 (Week 3): STG=LTG due to ELOS 11/28  Skilled Therapeutic Interventions: Skilled slp intervention focused on speech intelligibility. Pt completed speech intelligibility task requiring over-articulation at the word and phrase level with min A visual and verbal models. Educated on upright posture for optimal breath support for speech. She increased loudness level at word level and use of overarticulation for speech sounds as session continued. Cont therapy per plan of care.      Pain Pain Assessment Pain Scale: Faces Pain Score: 6  Faces Pain Scale: No hurt Pain Type: Chronic pain Pain Location: Foot Pain Descriptors / Indicators: Aching Pain Frequency: Constant Pain Onset: On-going Pain Intervention(s): Repositioned  Therapy/Group: Individual Therapy  Sharon Odom 04/22/2022, 12:26 PM

## 2022-04-22 NOTE — Progress Notes (Signed)
Occupational Therapy Session Note  Patient Details  Name: Sharon Odom MRN: [id number] Date of Birth: 05-05-1971  Today's Date: 04/22/2022 OT Individual Time: 4403-4742 OT Individual Time Calculation (min): 32 min    Short Term Goals: Week 3:  OT Short Term Goal 1 (Week 3): STG=LTG d/t ELOS  Skilled Therapeutic Interventions/Progress Updates: Patient received finishing breakfast sitting up in bed. Patient requesting to perform oral care and grooming tasks for 30 minute OT treatment. Patient set up with tooth brush and tooth paste. She needed assist to squeeze tooth paste. Used electric tooth brush to brush teeth. Able to use mouthwash after therapist assisted with squeeze and twist safety cap. Patient set up for washing face with wash cloth and soap. Able to use soap pump without assist and wash face thoroughly with only set up. Total assist to don pants for patient. Continue to work towards OT goal for improved independence with self care skills.      Therapy Documentation Precautions:  Precautions Precautions: Fall Required Braces or Orthoses: Splint/Cast Splint/Cast: L resting hand splint Restrictions Weight Bearing Restrictions: No     Pain: Pain Assessment Pain Scale: 0-10 Pain Score: 6  Pain Type: Chronic pain Pain Location: Foot Pain Descriptors / Indicators: Aching Pain Frequency: Constant Pain Onset: On-going Pain Intervention(s): Repositioned  Other Treatments:     Therapy/Group: Individual Therapy  Warnell Forester 04/22/2022, 12:20 PM

## 2022-04-22 NOTE — Progress Notes (Signed)
Physical Therapy Session Note  Patient Details  Name: Sharon Odom MRN: [id number] Date of Birth: 08/11/1970  Today's Date: 04/22/2022 PT Individual Time: 0920-1000, 1435-1535  PT Individual Time Calculation (min): 40 min, 60 min   Short Term Goals: Week 2:  PT Short Term Goal 1 (Week 2): d/c date >7-10 days, STG=LTG due to ELOS  Skilled Therapeutic Interventions/Progress Updates:      Therapy Documentation Precautions:  Precautions Precautions: Fall Required Braces or Orthoses: Splint/Cast Splint/Cast: L resting hand splint Restrictions Weight Bearing Restrictions: No  Treatment Session 1:  Pt agreeable to additional session with PT and received semi-reclined in bed following OT session. Pt without verbal complaints of pain and reports continued visual hallucinations when she closes her eyes. Pt requires mod A with supine to sit with increased time with use of adaptive equipment. Initially pt requires min A for sit to stand with stedy from bed and requires max A to maintain upright position in stedy to prevent anterior trunk flexion. Pt performed additional sit to stand min A from stedy flaps and then transferred to [facility]. Pt requests PT document in memory notebook of session and PT performed. Pt with increased confusion and requires cues to use memory setting features in [facility] for pressure relief.  Pt left seated in PWC at bedside with all needs in reach.    Treatment Session 2:  Pt agreeable to PT session. Pt states she is "scared" and continues to have visual hallucinations that she is looking down from a high level building, nursing notified. Patient required increased time for initiation, cuing, rest breaks, and for completion of tasks throughout session. Utilized therapeutic use of self throughout to promote efficiency. Pt self-propelled PWC to dayroom at safe speed and displayed difficulty with navigating obstacles requiring min A for navigation. Pt requested to trial NuStep. Pt  requires +2 to transfer from [facility] to Nustep and pt unable to perform due to increased posterior pelvic tilt that results in anterior translation off the seat and pt requires max A to maintain positioning on seat. Pt transferred back to [facility] and left seated in PWC at bedside with all needs in reach.    Therapy/Group: Individual Therapy  [name] [name] PT, DPT  04/22/2022, 8:00 AM

## 2022-04-23 ENCOUNTER — Inpatient Hospital Stay (HOSPITAL_COMMUNITY): Payer: BLUE CROSS/BLUE SHIELD

## 2022-04-23 DIAGNOSIS — R29898 Other symptoms and signs involving the musculoskeletal system: Secondary | ICD-10-CM

## 2022-04-23 DIAGNOSIS — M7989 Other specified soft tissue disorders: Secondary | ICD-10-CM

## 2022-04-23 LAB — CBC
HCT: 29.2 % — ABNORMAL LOW (ref 36.0–46.0)
Hemoglobin: 9.6 g/dL — ABNORMAL LOW (ref 12.0–15.0)
MCH: 32.5 pg (ref 26.0–34.0)
MCHC: 32.9 g/dL (ref 30.0–36.0)
MCV: 99 fL (ref 80.0–100.0)
Platelets: 155 10*3/uL (ref 150–400)
RBC: 2.95 MIL/uL — ABNORMAL LOW (ref 3.87–5.11)
RDW: 13.8 % (ref 11.5–15.5)
WBC: 6.7 10*3/uL (ref 4.0–10.5)
nRBC: 0 % (ref 0.0–0.2)

## 2022-04-23 LAB — BASIC METABOLIC PANEL
Anion gap: 5 (ref 5–15)
BUN: 16 mg/dL (ref 6–20)
CO2: 26 mmol/L (ref 22–32)
Calcium: 9.3 mg/dL (ref 8.9–10.3)
Chloride: 110 mmol/L (ref 98–111)
Creatinine, Ser: 0.66 mg/dL (ref 0.44–1.00)
GFR, Estimated: >60 mL/min (ref >60)
Glucose, Bld: 91 mg/dL (ref 70–99)
Potassium: 4.2 mmol/L (ref 3.5–5.1)
Sodium: 141 mmol/L (ref 135–145)

## 2022-04-23 MED ORDER — CEPHALEXIN 250 MG PO CAPS
500.0000 mg | ORAL_CAPSULE | Freq: Three times a day (TID) | ORAL | Status: DC
  Administered 2022-04-23 – 2022-04-24 (×3): 500 mg via ORAL
  Filled 2022-04-23 (×3): qty 2

## 2022-04-23 MED ORDER — FLUCONAZOLE 150 MG PO TABS
150.0000 mg | ORAL_TABLET | Freq: Once | ORAL | Status: AC
  Administered 2022-04-23: 150 mg via ORAL
  Filled 2022-04-23: qty 1

## 2022-04-23 MED ORDER — SORBITOL 70 % SOLN
30.0000 mL | Freq: Once | Status: AC
  Administered 2022-04-23: 30 mL via ORAL
  Filled 2022-04-23: qty 30

## 2022-04-23 NOTE — Plan of Care (Signed)
  Problem: RH Swallowing Goal: LTG Patient will consume least restrictive diet using compensatory strategies with assistance (SLP) Description: LTG:  Patient will consume least restrictive diet using compensatory strategies with assistance (SLP) Flowsheets (Taken 04/23/2022 2004) LTG: Pt Patient will consume least restrictive diet using compensatory strategies with assistance of (SLP): Minimal Assistance - Patient > 75%   Problem: RH Expression Communication Goal: LTG Patient will increase speech intelligibility (SLP) Description: LTG: Patient will increase speech intelligibility at word/phrase/conversation level with cues, % of the time (SLP) Flowsheets (Taken 04/23/2022 2004) LTG: Patient will increase speech intelligibility (SLP): Minimal Assistance - Patient > 75% Level: Phrase Percent of time patient will use intelligible speech: 50

## 2022-04-23 NOTE — Plan of Care (Signed)
  Problem: Sit to Stand Goal: LTG:  Patient will perform sit to stand in prep for activites of daily living with assistance level (OT) Description: LTG:  Patient will perform sit to stand in prep for activites of daily living with assistance level (OT) Outcome: Not Progressing Flowsheets (Taken 04/23/2022 1147) LTG: PT will perform sit to stand in prep for activites of daily living with assistance level: Moderate Assistance - Patient 50 - 74% Note: Downgraded due to current status    Problem: RH Toilet Transfers Goal: LTG Patient will perform toilet transfers w/assist (OT) Description: LTG: Patient will perform toilet transfers with assist, with/without cues using equipment (OT) Outcome: Not Progressing Flowsheets (Taken 04/23/2022 1147) LTG: Pt will perform toilet transfers with assistance level of: Moderate Assistance - Patient 50 - 74% Note: Downgraded due to limited progress

## 2022-04-23 NOTE — Progress Notes (Signed)
Physical Therapy Session Note  Patient Details  Name: Sharon Odom MRN: [id number] Date of Birth: December 13, 1970  Today's Date: 04/23/2022 PT Individual Time: 1400-1432 PT Individual Time Calculation (min): 32 min   Short Term Goals: Week 2:  PT Short Term Goal 1 (Week 2): d/c date >7-10 days, STG=LTG due to ELOS  Skilled Therapeutic Interventions/Progress Updates:      Therapy Documentation Precautions:  Precautions Precautions: Fall Required Braces or Orthoses: Splint/Cast Splint/Cast: L resting hand splint Restrictions Weight Bearing Restrictions: No  Pt received eating with nursing supervision and PT present for handoff. Pt expresses that she is upset and confused why she needs nursing assistance to eat and full supervision. PT educated pt on SLP recommendation. Pt requests to practice sit to stand transfers with stedy and requires mod A from w/c and min A from stedy flaps x 5. Pt presents with windswept posture and right cervical lateral flexion in standing. With visual feedback from mirror pt attempted to obtain midline alignment in standing. PT performed trigger point therapy to right upper trapezius and soft tissue mobilization and pt reported improvement. Pt left seated in PWC with all needs in reach.    Therapy/Group: Individual Therapy  [name] [name] PT, DPT  04/23/2022, 3:39 PM

## 2022-04-23 NOTE — Progress Notes (Signed)
Occupational Therapy Session Note  Patient Details  Name: Sharon Odom MRN: [id number] Date of Birth: [birth year]  Today's Date: 04/23/2022 OT Individual Time: 1100-1210 OT Individual Time Calculation (min): 70 min (10 min make up)    Short Term Goals: Week 3:  OT Short Term Goal 1 (Week 3): STG=LTG d/t ELOS  Skilled Therapeutic Interventions/Progress Updates:  Skilled OT with focus on reassessment of LTG's (downgraded to mod A), update team conference, care coordination with MSW and am self care routine bed with progress to power w/c sinkside. Pt in bed upon OT arrival with LB completed by previous OT. Transfer supine to EOB sitting with leg lifter use with max a and poor trunk alignment. Max A squat pivot to R side to TIS power w/c. Sink side facilitation of upright posture and head control due to decline in overall R side which had been relatively intact. Pt required mod A for face washing, UB sponge bathing and pull over shirt dressing with hemi techniques. Pt now with head/neck roll positioner and lateral supports in place and extremely beneficial due to decline in overall posture. Left pt in care of NT setting up pt's lunch meal tray.   Therapy Documentation Precautions:  Precautions Precautions: Fall Required Braces or Orthoses: Splint/Cast Splint/Cast: L resting hand splint Restrictions Weight Bearing Restrictions: No   Therapy/Group: Individual Therapy  Vicenta Dunning 04/23/2022, 8:02 AM

## 2022-04-23 NOTE — Progress Notes (Addendum)
Speech Language Pathology Daily Session Note  Patient Details  Name: Sharon Odom MRN: [id number] Date of Birth: 02-29-1971  Today's Date: 04/23/2022 SLP Individual Time: 4166-0630 Total Time: 60 minutes    Short Term Goals: Week 3: SLP Short Term Goal 1 (Week 3): STG=LTG due to ELOS 11/28  Skilled Therapeutic Interventions: S: Pt seen this date for skilled ST intervention targeting cognitive goals outlined in care plan. Pt received fatigued and lying semi-reclined in bed; attempting to feed herself. Reports discomfort on R side; repositioned in bed. Pt with cervical rotation to R, able to correct to midline; however, unable to maintain positioning during PO intake. Agreeable to ST intervention at bedside. Participatory with cues. Flat affect. Clinical swallow and motor speech evaluations completed at the request of MD this date. Please see evaluation note for details.  O: Within functional context of conversation, pt required Min A verbal cues to utilize repeat herself and slow her speech rate to be understood ~50% of the time; severe dysarthria limiting pt's intelligibility, with pt making no attempts to repair. Overt fatigue and weakness noted. Sup A for recall of recent medical events and able to direct care with extended processing time and use of single words and short phrases vs longer sentences to convey her wants and needs. SLP wrote down events from today's session. Discussed results of CSE and recommendations to complete repeat MBSS and receiving feeding assistance due to decline in self-feeding and PO intake. Pt verbalized understanding, though will need reinforcement of education and recommendations. Of note, pt benefited from Mod-Max A for feeding to eat all cottage cheese and 3/4 of yogurt.   A: Pt appears minimally sitmulable for skilled ST intervention as evident by increased lethargy and generalized weakness, which makes implementation of compensatory strategies difficult.  Recommend continuation of memory notebook and environmental supports/aids to facilitate communication and memory. Will also plan to proceed with MBSS next date.   P: Pt left in bed with all safety measures activated. Direct hand off to OT and IV team. Continue per current ST POC next session.  Pain None/Denies; NAD  Therapy/Group: Individual Therapy  Derrian Rodak A Liah Morr 04/23/2022, 7:15 AM

## 2022-04-23 NOTE — Progress Notes (Signed)
Patient ID: Sharon Odom, female   DOB: Nov 24, 1970, 51 y.o.   MRN: [id number]  Met with pt and spoke with her husband-[name] via telephone to discus plan. Both realize she can not go home like this. Both are in agreement with pursuing SNF have four bed offers. Gave both list and they will discuss tonight and decide which facility to pursue aware her insurance may not cover it but will try and try her secondary also is [employer] denies. Will see in am and begin work on [employer].

## 2022-04-23 NOTE — Consult Note (Signed)
Neuropsychological Consultation   Patient:   Sharon Odom   DOB:   07-Sep-1970  MR Number:  [id number]  Location:  [facility] [id number] [place] [address] Dept: [place]: [phone]           Date of Service:   04/23/2022  Start Time:   10 AM End Time:   11 AM  Provider/Observer:  Ilean Skill, Psy.D.       Clinical Neuropsychologist       Billing Code/Service: 96158/96159  Chief Complaint:    Sharon Odom is a 51 year old female referred for neuropsychological consultation due to coping and adjustment issues with history of progressive MS.  Anxiety and depressive symptoms have been present with significant functional decline over several years.  Patient has had significant left greater than right spasticity with subsequent implantation of baclofen pump.  Patient underwent revision of pump on 01/11/2022.  Patient reported on 03/02/2022 worsening pain despite increase of 5% and baclofen pump.  Patient is also taking oral baclofen, oxycodone.  Patient has significant hypophonia, neurogenic bladder.  Patient has limited mobility and uses a power wheelchair.  Patient continues to take Wellbutrin for depressive symptomatology and has been followed by Dr. Dagoberto Ligas in the outpatient clinic as well.  Patient transferred off CIR to Acute Care on 03/27/2022 due to infection with Neuro consult due to new lesions found on MRI and Solu-medrol started.  Reason for Service:  Patient referred for neuropsychological consultation due to coping and adjustment issues with significant anxiety and depressive symptoms.  Below is the HPI for the current admission.  HPI: Sharon Odom is a 51 year old female with a history of secondary progressive multiple sclerosis. She has had significant left greater than right spasticity with subsequent implantation of intrathecal pump for baclofen administration. She  underwent revision of the abdominal portion of the pump by Dr. Reatha Armour on 01/11/2022. At the time of follow up on 10/6, she reported worsening pain especially when sitting despite increase in 5% increase in baclofen pump. She has been taking oral baclofen 20 mg six/day, oxycodone 10 mg six/day and on Dantrolene 100 mg BID. Continues on bowel regimen of Miralax, Metamucil and Sen[place]ot.  She has significant hypophonia, neurogenic bladder with indwelling Foley catheter. She is tolerating regular diet with thin liquids and taking medications crushed in applesauce.  She had limited mobility and uses a power wheelchair. Sleeps in chair or on couch. She can drive WC into handicap shower and transfers to shower bench. She uses a portable BSC transfers for bowel program. She has a vertical power lift in her garage to from from power WC to car. She uses a manual WC in the community for appointments. He husband lifts her onto couch, into car, etc as she has not stood for many months. The patient requires inpatient physical medicine and rehabilitation evaluations and treatment secondary to dysfunction due to secondary progressive multiple sclerosis.   She follows with Dr. Arlice Colt, [facility]. Last [place] in 2021.   Pt reports spasticity has been getting worse- 6 months ago, her strength was much better- was able to transfer in/out of a shower; walk ~ 20-30 ft with RW, and transfer without help- not needs almost max-total A to transfer; hasn't been able to get in shower, needs foley for toileting.  Had been put on nectar thick liquids when in hospital last- stopped, because swallowing "felt better" and hated them.  Current Status:  The patient is in the process of getting ready for discharge.  The patient continues to do very poorly with regard to strength and overall capacity to perform ADLs.  The patient needs significant assistance from a physical standpoint.  The patient also appears to be  continuing with symptoms of depression.  The patient continues to take her Wellbutrin but has a number of medications to manage her severe and progressing multiple sclerosis.  The patient has very specific and understandable reasons for frustration due to significant physical limitations.  The patient continues to have difficulty even maintaining the volume of her speech and uses an amplifier but even with that it is difficult to understand what the patient is saying.  The patient is worried about discharge home and how much she will be able to get as far as assistance from her husband.  The patient continues to do very poorly and while she has been an participant in therapeutic efforts she continues to be very weak but at this point little functional gains are being made.  Behavioral Observation: Aimy Sweeting  presents as a 51 y.o.-year-old Right handed Caucasian Female who appeared her stated age. her dress was Appropriate and she was Well Groomed and her manners were Appropriate to the situation.  her participation was indicative of Appropriate and Redirectable behaviors.  There were physical disabilities noted.  she displayed an appropriate level of cooperation and motivation.     Interactions:    Active Appropriate  Attention:   within normal limits and attention span and concentration were age appropriate  Memory:   within normal limits; recent and remote memory intact  Visuo-spatial:  not examined  Speech (Volume):  low  Speech:   normal; slurred  Thought Process:  Coherent and Relevant  Though Content:  WNL; not suicidal and not homicidal  Orientation:   person, place, time/date, and situation  Judgment:   Good  Planning:   Fair  Affect:    Anxious, Depressed, and Tearful  Mood:    Dysphoric  Insight:   Good  Intelligence:   high   Medical History:   Past Medical History:  Diagnosis Date   Allergies    Anxiety    Arthritis    Asthma    seasonal - pollen    Constipation    Depression    Dyspnea    with exertion   GERD (gastroesophageal reflux disease)    Headache    History of hiatal hernia    Left radial nerve palsy 03/15/2020   resolved - Left arm/hand weak   Multiple sclerosis (HCC)    Pneumonia 2007   x 1   Vision abnormalities    Wears glasses          Patient Active Problem List   Diagnosis Date Noted   Pressure injury of skin 04/03/2022   Hypokalemia 03/29/2022   Normocytic anemia 03/28/2022   UTI (urinary tract infection) with pyuria 03/27/2022   S/P insertion of intrathecal pump 03/13/2022   Neurogenic bladder 12/29/2021   Urinary retention 12/29/2021   Thrombocytopenia (HCC) 11/08/2021   Spasticity 09/28/2021   Spastic hemiparesis of left nondominant side due to non-cerebrovascular etiology (HCC) 07/17/2021   Relapsing remitting multiple sclerosis (HCC) 07/17/2021   Transient hypotension 06/18/2021   Mild intermittent asthma 06/08/2021   Urinary incontinence 06/08/2021   Wound infection after surgery 04/06/2021   Mechanical complication of device 12/21/2020   Wheelchair dependence 12/16/2020   Left arm weakness 07/15/2020  Left radial nerve palsy 03/15/2020   Hoarseness 12/14/2019   Dysarthria 12/14/2019   Dysesthesia 09/03/2019   Low serum vitamin B12 06/08/2019   Low serum vitamin D 06/08/2019   Vitamin deficiency related neuropathy ([place]) 06/08/2019   Multiple sclerosis ([facility]) 05/13/2019   Steroid-induced hyperglycemia    Hypoalbuminemia due to protein-calorie malnutrition ([place])    Neurogenic bowel    Asthma 04/02/2019   Constipated 04/02/2019   Spell of generalized weakness 03/31/2019   Presence of intrathecal baclofen pump 03/19/2019   Dysphagia 04/14/2018   Spastic diplegia, acquired, lower extremity ([place]) [id number]   Acute metabolic encephalopathy [id number]   Gait disturbance 08/15/2017   Depression with anxiety 08/15/2017   Diplopia 08/15/2017   Failure to thrive in adult 03/04/2017   High  risk medication use 02/06/2017    Psychiatric History:  Patient with depressive and anxiety type symptoms going back several years but none prior to the development of significant impact of her multiple sclerosis.  Family Med/Psych History:  Family History  Problem Relation Age of Onset   Diabetes Mother    Healthy Brother    Impression/DX:  Sareya Salinger is a 51 year old female referred for neuropsychological consultation due to coping and adjustment issues with history of progressive MS.  Anxiety and depressive symptoms have been present with significant functional decline over several years.  Patient has had significant left greater than right spasticity with subsequent implantation of baclofen pump.  Patient underwent revision of pump on 01/11/2022.  Patient reported on 03/02/2022 worsening pain despite increase of 5% and baclofen pump.  Patient is also taking oral baclofen, oxycodone.  Patient has significant hypophonia, neurogenic bladder.  Patient has limited mobility and uses a power wheelchair.  Patient continues to take Wellbutrin for depressive symptomatology and has been followed by Dr. Dagoberto Ligas in the outpatient clinic as well.  Patient transferred off CIR to Acute Care on 03/27/2022 due to infection with Neuro consult due to new lesions found on MRI and Solu-medrol started  The patient is in the process of getting ready for discharge.  The patient continues to do very poorly with regard to strength and overall capacity to perform ADLs.  The patient needs significant assistance from a physical standpoint.  The patient also appears to be continuing with symptoms of depression.  The patient continues to take her Wellbutrin but has a number of medications to manage her severe and progressing multiple sclerosis.  The patient has very specific and understandable reasons for frustration due to significant physical limitations.  The patient continues to have difficulty even maintaining the volume of  her speech and uses an amplifier but even with that it is difficult to understand what the patient is saying.  The patient is worried about discharge home and how much she will be able to get as far as assistance from her husband.  The patient continues to do very poorly and while she has been an participant in therapeutic efforts she continues to be very weak but at this point little functional gains are being made.         Electronically Signed   _______________________ Ilean Skill, Psy.D. Clinical Neuropsychologist

## 2022-04-23 NOTE — Progress Notes (Signed)
Lower extremity venous left study completed.  Preliminary results relayed to [place], Char[employer] fundraiser.  See CV Proc for preliminary results report.   Jean Rosenthal, RDMS, RVT

## 2022-04-23 NOTE — Progress Notes (Signed)
Physical Therapy Session Note  Patient Details  Name: Sharon Odom MRN: [id number] Date of Birth: 04/05/1971  Today's Date: 04/23/2022 PT Individual Time: 1500-1525 PT Individual Time Calculation (min): 25 min   Short Term Goals: Week 3:     Skilled Therapeutic Interventions/Progress Updates:    Pt received in PWC, agreeable to therapy. No complaint of pain. Pt with questions about her care as she has heard different things from her physician and from nsg staff and SLP. Assisted pt with messaging MD and double checking notes from this AM to answer questions. Therapist then assisted pt with repositioning in chair, with pt requiring tot A and use of power chair functions. Pt remained in PWC at end of session and was left with all needs in reach and alarm active.   Therapy Documentation Precautions:  Precautions Precautions: Fall Required Braces or Orthoses: Splint/Cast Splint/Cast: L resting hand splint Restrictions Weight Bearing Restrictions: No General:       Therapy/Group: Individual Therapy  Juluis Rainier 04/23/2022, 3:35 PM

## 2022-04-23 NOTE — Progress Notes (Signed)
Occupational Therapy Session Note  Patient Details  Name: Sharon Odom MRN: [id number] Date of Birth: Oct 03, 1970  Today's Date: 04/23/2022 OT Individual Time: 1040-1100 OT Individual Time Calculation (min): 20 min    Short Term Goals: Week 3:  OT Short Term Goal 1 (Week 3): STG=LTG d/t ELOS  Skilled Therapeutic Interventions/Progress Updates:    Upon OT arrival, Nursing in room replacing pt's IV. Pt missed 10 minute OT treatment and minutes will be made up as able. Pt agreeable to OT treatment and requests to change her clothes. Pt retrieves tissue from tissue box with setup assist using R UE and is able to wipe her nose and mouth. Pt completes LB dressing with Max A bed level and completes rolling to the L with Min A and rolls to the R with Mod A for pants management. Increased time and effort required. Bed controls utilized to manage UB dressing and remove shirt with Max A. Recommend pt complete remainder of UB dressing and grooming tasks with next therapy session and pt verbalizes understanding secondary to time constraints. Pt requesting her fruit cup and her current diet recommendation is needing full assist with eating and supervision and pt states "that is when I use the utensils not for finger foods". Collaborated with RN and pt's RN aware of pt left with fruit cup on her lap and tray nearby for water/milk and is okay with this. Pt left in bed with all needs met.   Therapy Documentation Precautions:  Precautions Precautions: Fall Required Braces or Orthoses: Splint/Cast Splint/Cast: L resting hand splint Restrictions Weight Bearing Restrictions: No General: General OT Amount of Missed Time: 10 Minutes   Therapy/Group: Individual Therapy  Marvetta Gibbons 04/23/2022, 11:50 AM

## 2022-04-23 NOTE — Evaluation (Addendum)
Speech Language Pathology Assessment and Plan  Patient Details  Name: Sharon Odom MRN: [id number] Date of Birth: 1970/06/27  SLP Diagnosis: Dysphagia;Cognitive Impairments;Dysarthria  Rehab Potential: Good ELOS: 2 weeks    Today's Date: 04/23/2022 SLP Individual Time: 7416-3845 SLP Individual Time Calculation (min): 60 min   Hospital Problem: Principal Problem:   Multiple sclerosis ([place]) Active Problems:   Spasticity   Pressure injury of skin  Past Medical History:  Past Medical History:  Diagnosis Date   Allergies    Anxiety    Arthritis    Asthma    seasonal - pollen   Constipation    Depression    Dyspnea    with exertion   GERD (gastroesophageal reflux disease)    Headache    History of hiatal hernia    Left radial nerve palsy 03/15/2020   resolved - Left arm/hand weak   Multiple sclerosis ([place])    Pneumonia 2007   x 1   Vision abnormalities    Wears glasses    Past Surgical History:  Past Surgical History:  Procedure Laterality Date   COLONOSCOPY     EXCISION MORTON'S NEUROMA Right    fallopian tube removal     INTRATHECAL PUMP IMPLANT Right 02/18/2019   Procedure: INTRATHECAL PUMP IMPLANT;  Surgeon: Clydell Hakim, MD;  Location: [facility];  Service: Neurosurgery;  Laterality: Right;  INTRATHECAL PUMP IMPLANT   INTRATHECAL PUMP IMPLANT Right 02/17/2021   Procedure: Baclofen Pump Replacement;  Surgeon: Erline Levine, MD;  Location: [facility];  Service: Neurosurgery;  Laterality: Right;  3C/RM 21   INTRATHECAL PUMP REVISION N/A 01/11/2022   Procedure: Baclofen Pump revision;  Surgeon: Dawley, Theodoro Doing, DO;  Location: [facility];  Service: Neurosurgery;  Laterality: N/A;  RM 21 to follow   PAIN PUMP IMPLANTATION N/A 09/28/2021   Procedure: Insertion of baclofen pump, right lower quadrant;  Surgeon: Dawley, Theodoro Doing, DO;  Location: [facility];  Service: Neurosurgery;  Laterality: N/A;   PAIN PUMP REMOVAL Left 04/06/2021   Procedure: Removal of baclofen pump, Left;  Surgeon:  Dawley, Theodoro Doing, DO;  Location: [facility];  Service: Neurosurgery;  Laterality: Left;  Lateral/Left//3C rm 19   UPPER GI ENDOSCOPY     WISDOM TOOTH EXTRACTION     WISDOM TOOTH EXTRACTION      Assessment / Plan / Recommendation Clinical Impression 51 year old female with history of 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 Initial MS diagnosis 2013. ITB pump removed 04/06/21 due to pump infection. Revised pump 01/11/22.    Presented on 03/13/22 from home to fine tune ITB pump that can not be done in visits to outpatient clinic. Also in need of intensive rehabilitation to improve her over all level of function. Currently drinking regular liquids as no longer in need of nectar thick liquids. Does take pills with applesauce. Has foley indwelling catheter. Continent of bowel but spouse reports BM schedule usually every two weeks and is continent. On miralax, metamucil and senokot. Chronic pain with spasticity issues. Home regimen of Valium, Zabnaflex, and baclofen. Oxycodone reportedly gives little relief. On lamictal for nerve pain. Recently began Dantrolene. Outpatient follow up with Dr Melene [facility]. Last [facility] January 2021.  MD requesting CSE due to change in functional status.  SLP consulted to complete clinical swallow and motor speech evaluations in the setting of functional decline in status in the setting of progressive MS. Pt encountered very fatigued, with eyes closed, and lying semi-reclined  in bed. Attempting to feed herself breakfast. Agreeable to intervention.  This date, pt presents with mild-moderate oral dysphagia, in addition to known mild pharyngeal dysphagia sx's. Clinically, pt exhibits prolonged mastication, R anterior anterior oral spillage of thin liquids due to poor head control/positioning, diffuse lingual residuals post swallow, intermittent wet vocal quality, and multiple swallows per bolus. These findings are likely  exacerbated by pt's fatigue and decreased endurance. Per MBSS completed in October 2023, pt presented with mild oropharyngeal dysphagia with sensed aspiration of thin liquids; however, pt able to utilize compensatory swallowing techniques more readily at that time. At this time, pt with limited utility of compensatory techniques due to lethargy and generalized weakness. Of note pt's speech intelligibility has also declined since October 2023, with pt <50% intelligible at the short phrase and sentence level without use of speech intelligibility strategies, which pt required Min A verbal cues to implement. With use of strategies, speech intelligibility improved to 65-70%. Pt would benefit from over-articulation, amplifier, and slow rate/pacing. Dysarthria c/b hypernasality, imprecise articulation, poor pausing/pacing, breathy vocal quality, and decreased breath support.   Given clinical presentation and functional decline, recommend downgrading to Dysphagia 2 textures to aid in energy conservation and ease of mastication, in addition to repeating instrumental swallow assessment to assess tolerance and safety for thin liquids given pt is no longer able to utilize compensatory swallowing strategies consistently. Discussed with pt the importance of balancing safety with adequate nutrition/hydration. Given decline in self-feeding status, recommend 1:1 feeding assistance when pt is eating with utensils; can still self-feed finger foods. May also benefit from RD consult given decreased PO intake due to motor impairments. Pt and interdisciplinary team in agreement with proposed plan. Will proceed with MBSS next date. Additionally, will add goals for speech intelligibility.    ui CSE completed. Please see above for additional details.  SLP Assessment  Patient will need skilled Speech Lanaguage Pathology Services during CIR admission    Recommendations  SLP Diet Recommendations: Thin;Dysphagia 2 (Fine chop) Liquid  Administration via: Straw Medication Administration: Whole meds with puree (or crushed) Supervision: Patient able to self feed;Staff to assist with self feeding Compensations: Minimize environmental distractions;Slow rate;Small sips/bites;Effortful swallow;Multiple dry swallows after each bite/sip;Monitor for anterior loss Postural Changes and/or Swallow Maneuvers: Seated upright 90 degrees;Upright 30-60 min after meal Oral Care Recommendations: Oral care QID Patient destination: [facility] (SNF) Follow up Recommendations: Skilled Nursing facility Equipment Recommended: None recommended by SLP    SLP Frequency 3 to 5 out of 7 days   SLP Duration  SLP Intensity  SLP Treatment/Interventions 2 weeks  Minumum of 1-2 x/day, 30 to 90 minutes  Patient/family education;Internal/external aids;Dysphagia/aspiration precaution training    Pain    Prior Functioning Cognitive/Linguistic Baseline: Baseline deficits Baseline deficit details: memory Type of Home: House  Lives With: Spouse Available Help at Discharge: Family;Available PRN/intermittently  SLP Evaluation Cognition Overall Cognitive Status: History of cognitive impairments - at baseline Arousal/Alertness: Awake/alert Orientation Level: Oriented X4 Attention: Sustained Sustained Attention: Appears intact Memory: Impaired Memory Impairment: Decreased recall of new information Problem Solving: Appears intact  Comprehension Auditory Comprehension Overall Auditory Comprehension: Appears within functional limits for tasks assessed Visual Recognition/Discrimination Discrimination: Not tested Reading Comprehension Reading Status: Not tested Expression Expression Primary Mode of Expression: Verbal Verbal Expression Overall Verbal Expression: Impaired Initiation: No impairment Level of Generative/Spontaneous Verbalization: Conversation Pragmatics: No impairment Interfering Components: Speech  intelligibility Written Expression Dominant Hand: Right Written Expression: Not tested Oral Motor Oral Motor/Sensory Function Overall Oral Motor/Sensory Function:  Generalized oral weakness Motor Speech Intelligibility: Intelligibility reduced Word: 50-74% accurate Phrase: 25-49% accurate Sentence: 25-49% accurate Conversation: 25-49% accurate Motor Planning: Witnin functional limits Motor Speech Errors: Not applicable Effective Techniques: Amplifier;Slow rate;Over-articulate;Increased vocal intensity  Care Tool Care Tool Cognition Ability to hear (with hearing aid or hearing appliances if normally used Ability to hear (with hearing aid or hearing appliances if normally used): 0. Adequate - no difficulty in normal conservation, social interaction, listening to TV   Expression of Ideas and Wants Expression of Ideas and Wants: 2. Frequent difficulty - frequently exhibits difficulty with expressing needs and ideas   Understanding Verbal and Non-Verbal Content Understanding Verbal and Non-Verbal Content: 4. Understands (complex and basic) - clear comprehension without cues or repetitions  Memory/Recall Ability Memory/Recall Ability : Current season;Staff names and faces;That he or she is in a hospital/hospital unit   Intelligibility: Intelligibility reduced Word: 50-74% accurate Phrase: 25-49% accurate Sentence: 25-49% accurate Conversation: 25-49% accurate  Bedside Swallowing Assessment General Date of Onset: 04/03/22 Previous Swallow Assessment: 1/23 MBS silent aspriation on thin liquids; nectar thick and regular textures; 10/20 - recs for regular and thin liquids with use of effortful and secondary swallow Diet Prior to this Study: Regular;Thin liquids Temperature Spikes Noted: No Respiratory Status: Room air Behavior/Cognition: Alert;Cooperative Oral Cavity - Dentition: Adequate natural dentition Self-Feeding Abilities: Able to feed self;Needs assist Patient Positioning:  Upright in bed Baseline Vocal Quality: Wet;Low vocal intensity Volitional Cough: Weak Volitional Swallow: Able to elicit  Oral Care Assessment   Ice Chips Ice chips: Not tested Thin Liquid Thin Liquid: Impaired Presentation: Straw Oral Phase Impairments: Reduced labial seal Oral Phase Functional Implications: Left anterior spillage;Oral residue;Prolonged oral transit Pharyngeal  Phase Impairments: Suspected delayed Swallow;Multiple swallows Nectar Thick Nectar Thick Liquid: Not tested Honey Thick Honey Thick Liquid: Not tested Puree Puree: Impaired Presentation: Spoon Oral Phase Functional Implications: Oral residue;Prolonged oral transit Pharyngeal Phase Impairments: Suspected delayed Swallow Solid Solid: Impaired Presentation: Spoon Oral Phase Impairments: Impaired mastication;Reduced lingual movement/coordination Oral Phase Functional Implications: Oral residue;Impaired mastication;Prolonged oral transit Pharyngeal Phase Impairments: Multiple swallows;Suspected delayed Swallow BSE Assessment Suspected Esophageal Findings Suspected Esophageal Findings:  (Not observed) Risk for Aspiration Impact on safety and function: Mild aspiration risk;Moderate aspiration risk Other Related Risk Factors: History of pneumonia;History of dysphagia;Decreased respiratory status;Deconditioning;Lethargy  Short Term Goals: Week 3: SLP Short Term Goal 1 (Week 3): STG=LTG due to ELOS 11/28  Refer to Care Plan for Long Term Goals  Recommendations for other services: None   Discharge Criteria: Patient will be discharged from SLP if patient refuses treatment 3 consecutive times without medical reason, if treatment goals not met, if there is a change in medical status, if patient makes no progress towards goals or if patient is discharged from hospital.  The above assessment, treatment plan, treatment alternatives and goals were discussed and mutually agreed upon: by patient  Vinnie Langton 04/23/2022, 5:28 PM

## 2022-04-23 NOTE — Progress Notes (Signed)
PROGRESS NOTE   Subjective/Complaints:  Pt reports still having pain- wants Nucynta increased either frequency or dosage.   I'm ok with that usually, but with pt having hallucinations, I'd rather wait.   Pt admits hallucinations are about the same- ever since started steroids- didn't get better when finished IV steroids.   Also, "problems finding her mouth" when trying to feed herself.  Also said "skin feels like has bubbles and squishy". Which didn't make sense.   Also c/o LUE/LLE swelling worse than R side.  Also intermittent choking when eating pancakes- that I noticed. Will Ask SLP to assess swallowing.   No BM since 11/23  ROS:  Pt denies SOB, abd pain, CP, N/V/C/D, and vision changes  Except for HPI  Objective:   No results found. Recent Labs    04/23/22 0525  WBC 6.7  HGB 9.6*  HCT 29.2*  PLT 155     Recent Labs    04/23/22 0525  NA 141  K 4.2  CL 110  CO2 26  GLUCOSE 91  BUN 16  CREATININE 0.66  CALCIUM 9.3      Intake/Output Summary (Last 24 hours) at 04/23/2022 1235 Last data filed at 04/23/2022 0729 Gross per 24 hour  Intake 476 ml  Output 1050 ml  Net -574 ml     Pressure Injury 03/31/22 Sacrum Medial Deep Tissue Pressure Injury - Purple or maroon localized area of discolored intact skin or blood-filled blister due to damage of underlying soft tissue from pressure and/or shear. (Active)  03/31/22 0815  Location: Sacrum  Location Orientation: Medial  Staging: Deep Tissue Pressure Injury - Purple or maroon localized area of discolored intact skin or blood-filled blister due to damage of underlying soft tissue from pressure and/or shear.  Wound Description (Comments):   Present on Admission: Yes     Pressure Injury 04/03/22 Thigh Left;Posterior;Proximal Stage 2 -  Partial thickness loss of dermis presenting as a shallow open injury with a red, pink wound bed without slough. red pink  (Active)  04/03/22 1751  Location: Thigh  Location Orientation: Left;Posterior;Proximal  Staging: Stage 2 -  Partial thickness loss of dermis presenting as a shallow open injury with a red, pink wound bed without slough.  Wound Description (Comments): red pink  Present on Admission: Yes    Physical Exam: Vital Signs Blood pressure 106/60, pulse 64, temperature 98.9 F (37.2 C), temperature source Oral, resp. rate 16, height 5\' 9"  (1.753 m), weight 56.2 kg, SpO2 98 %.   General: awake, alert, appropriate, sitting up in bed; R head tilt severe even with propping; needs A to feed self, which is new; NAD HENT: conjugate gaze; oropharynx moist CV: regular rate; no JVD Pulmonary: CTA B/L; no W/R/R- good air movement GI: soft, NT, slightly distended, (+) ITB pump R side Psychiatric: appropriate Neurological: almost aphonic; dysarthric severe- hard to understand; also c/o hallucinations Extremities: L hand mildly swollen- likely from IV just above L>>R foot/ankle edema MSK: Moving all 4 extremities in bed, BL UE antigravity.  Skin: redness along chest, without urticaria, bleeding, or pustules.   PE from prior encounter:   General: awake, alert, appropriate, sitting up in w/c- head  all the way to R- needs better head support on w/c; NAD HENT: conjugate gaze; oropharynx moist CV: regular rate; no JVD Pulmonary: CTA B/L; no W/R/R- good air movement GI: soft, NT, ND, (+)BS- ITB pump in place on R side Psychiatric: appropriate Neurological: Ox3- but still extremely dysarthric and almost aphonic- is worse than originally, but better than 2 days ago  LLE- back to baseline  Skin:    General: Skin is warm and dry.     Comments: New DTI on coccyx  Also worsening skin open/pressure Stage II on posterior thigh- on R thigh Also small DTI on R heel  Neurological:     Mental Status: She is alert.     Comments: MAS 2 to 3 RLE, MAS 1+ LLE  Motor 5/5 RUE 3+ LUE RLE- HF/KE/DF and PF 4-/5- was  2/5 LLE- HF 1 to 2-/5; otherwise 2+/5 which is better than 1/5  MS: Head lolling somewhat to right, but able to bring back to midline without assistance  Assessment/Plan: 1. Functional deficits which require 3+ hours per day of interdisciplinary therapy in a comprehensive inpatient rehab setting. Physiatrist is providing close team supervision and 24 hour management of active medical problems listed below. Physiatrist and rehab team continue to assess barriers to discharge/monitor patient progress toward functional and medical goals  Care Tool:  Bathing    Body parts bathed by patient: Right arm, Left arm, Chest, Abdomen, Face   Body parts bathed by helper: Right lower leg, Left lower leg, Front perineal area, Buttocks, Right upper leg, Left upper leg     Bathing assist Assist Level: Maximal Assistance - Patient 24 - 49%     Upper Body Dressing/Undressing Upper body dressing   What is the patient wearing?: Pull over shirt    Upper body assist Assist Level: Moderate Assistance - Patient 50 - 74%    Lower Body Dressing/Undressing Lower body dressing      What is the patient wearing?: Underwear/pull up, Pants     Lower body assist Assist for lower body dressing: Maximal Assistance - Patient 25 - 49%     Toileting Toileting Toileting Activity did not occur (Clothing management and hygiene only): N/A (no void or bm)  Toileting assist Assist for toileting: Moderate Assistance - Patient 50 - 74%     Transfers Chair/bed transfer  Transfers assist     Chair/bed transfer assist level: Maximal Assistance - Patient 25 - 49%     Locomotion Ambulation   Ambulation assist   Ambulation activity did not occur: Safety/medical concerns (unable to perform due to pain, weakness and decreased activity tolerance)          Walk 10 feet activity   Assist  Walk 10 feet activity did not occur: Safety/medical concerns (unable to perform due to pain, weakness and decreased  activity tolerance)        Walk 50 feet activity   Assist Walk 50 feet with 2 turns activity did not occur: Safety/medical concerns (unable to perform due to pain, weakness and decreased activity tolerance)         Walk 150 feet activity   Assist Walk 150 feet activity did not occur: Safety/medical concerns (unable to perform due to pain, weakness and decreased activity tolerance)         Walk 10 feet on uneven surface  activity   Assist Walk 10 feet on uneven surfaces activity did not occur: Safety/medical concerns (unable to perform due to pain, weakness and decreased activity  tolerance)         Wheelchair     Assist Is the patient using a wheelchair?: Yes Type of Wheelchair: Power    Wheelchair assist level: Supervision/Verbal cueing Max wheelchair distance: 300 ft    Wheelchair 50 feet with 2 turns activity    Assist        Assist Level: Supervision/Verbal cueing   Wheelchair 150 feet activity     Assist      Assist Level: Supervision/Verbal cueing   Blood pressure 106/60, pulse 64, temperature 98.9 F (37.2 C), temperature source Oral, resp. rate 16, height 5\' 9"  (1.753 m), weight 56.2 kg, SpO2 98 %.  Medical Problem List and Plan: 1. Functional deficits due to secondary progressive multiple sclerosis and debility from prolonged admission on acute floor for encephalopathy             -patient may  shower             -ELOS/Goals: min A to supervision- 7-10 days  D/c 11/28  Cannot d/c tomorrow- still hallucinating- likely needs SNF  Con't CIR- PT, OT and SLP for swallowing 2.  Antithrombotics: -DVT/anticoagulation:  Pharmaceutical: Lovenox             -antiplatelet therapy: none  - TEDs for BL LE swelling  11/27- will check Dopplers of LLE due to increasing swelling- L>>R LE swelling- is new- also has L hand swelling, likely due to infiltrated IV- let nurse know to swithc out IV 3. Pain Management:              -Naproxen 500 mg  twice daily             -Tylenol as needed        will change to Nucynta 75 mg q6 hours - will not add Methadone due to chance of polypharmacy   11/14- con't Nucynta- since change didn't increase dose went from 50 mg q4 to 75 mg q6 hours prn-did decrease Am baclofen to 20 mg- con't other doses at 20 mg and reduced doses of zanaflex-tomorrow, if no other issues, will reduce Valium to 2.5 mg q8 hours    11/16- Has UTI- discussed with pt Oxy vs Nucynta- I will not increase nucynta higher dose - but can change back to Oxycodone 10 mg q4 or 15 mg q6- I don't feel comfortable with 15 mg q4 hours  11/17- will increase tylenol to 1000 mg but max 3x/day- DO NOT increase pain meds over weekend, esp with lower BP lately.   11/20- will decrease ITB pump tomorrow and do trigger point injections of shoulders/upper back/neck for pain control 11/23- TrP injections done 11/21- didn't help pain in head/neck- con't regimen for now  11/27- pt asking to increase Nucynta- if UTI treated/hallucinations improve, then I think it's reasonable to increase Nucynta to 75 mg QID prn 4. Mood/Behavior/Sleep: LCSW to evaluate and provide emotional support             -antipsychotic agents: n/a             -Wellbutrin XL 300 mg daily             -amantadine 100 mg TID- per Dr 12/27 5. Neuropsych/cognition: This patient is capable of making decisions on her own behalf. 6. Skin/Wound Care: Routine skin care checks  11/7- has new DTI on coccyx, worsening now stage II on posterior R thigh and small DTI R heel- added prevalon boots in bed; pt will NOT use air  mattress- changing to regular bed due to pt insistence; and foam dressing and turn q2 hours.   11/9- pt doesn't like prevalon boots, but educated on the importance 7. Fluids/Electrolytes/Nutrition: Routine Is and Os and follow-up chemistries             -continue Vitamin C, D3, B12 and fiber supplements 8: MS with spasticity: continue dalfampridine BID and [place]               -Baclofen 20 mg twice daily, 40 mg every morning             -Valium 5 mg every 8 hours             -Zanaflex 4 mg twice daily- were reduced due to cognition/sedation on acute- will change out /refill pump Thursday as well as increase dosing of ITB pump  11/8- spasticity slightly more than normal, however increasing ITB pump tomorrow, so will not increase spasticity PO meds  11/9- increasing ITB pump today- and refilling it.   11/10- pump up to 540 mcg/day  11/14- wait on changing dose- discussed with pt x2 about if we increase ITB PUMP, will help spasticity pain, however will make her functionally weaker. Will d/w pt and husband again as well  11/20- will reduce dose tomorrow to prior dose.  11/23- reduced dose to 485 mcg and refill date 09/09/22- on IV solumedrol x 5 days- 11/24- finish IV Solumedrol on 11/25   9: GERD: restart Zantac 10: Neurogenic bladder with indwelling Foley; s/p UTI treatment with Zosyn 11: Bowel program: Nutrisource fiber, psyllium caps, Colace, MiraLax, Senekot-S given daily             -placed PRN sorbitol and Fleets orders- has gotten really constipated/needing multiple enemas-   11/9- will increase miralax to BID x 3 days since increasing pump- no BM since admission to rehab 11/7  11/14- LBM x2 on 11/12- if no BM by tomorrow, will do Sorbitol and possibly SSE  11/15- medium-large BM today  11/17- if no BM by tomorrow- needs intervention  11/19 Sorbitol ordered  11/23- LBM yesterday  11/24- LBM last night  11/27- LBM yesterday per pt however last documented 11/23- will give Sorbitol to get her ot go after therapy today 12: Dysphagia: ? resolved; now on regular diet; SLP eval  11/27- see below 13: Seasonal allergies off home meds: monitor 14: Tooth abscess: missed follow-up appointment; completed Flagyl 15: Hypokalemia: on K+ supplement QOD; follow-up BMP tomorrow- likely due to previous Lasix dose for Edema.    11/8- K+ 4.1  11/14- K+ 4.3  Recheck  tomorrow  11/27- K+ 4.2 16. Insomnia  11/9- will add Trazodone 50 mg QHS and can titrate up as required  11/10- increase to 150 mg QHS  11/14- will decrease to 100 mg QHS since having some confusion  11/17- having weird dreams but doesn't want to stop Trazodone- explained it's from trazodone.  17. - UTI with E coli  11/14- pt asking [place] to check U/A And Cx- since feels getting a UTI- BP on low side as well- asymptomatic per pt  11/15- Pt has significant E Coli UTI- on Zosyn- will con't until sensitivities are back  11/16- Will change to Cefazolin per d/w pharmacy- was completed  18. Confusion/ progressive weakness- due to high dose IV steroids  11/24- finishing steroids tomorrow- Neuro was OK with her continuing since has more strength in RLE and more movement also in LLE  -also needs head support and lateral  supports for w/c- PT will address getting for pt.   11/27- pt reports still confused and having hallucinations- that aren't better off steroids- U/A (+) for leuks, (-) nitrites, rare bacteria, (+) yeast and >50 WBC- doesn't have fever or elevated WBC, however is confused- will try Diflucan 150 mg x1 for yeast and Keflex 500 mg q8 hours for 5 days per ID pharmacist. U Cx pending  -also placed on Seroquel for Confusion by Neurology- will con't  19. Dysphagia  11/27- will order SLP to evaluate Swallowing- they've made her D2 diet- and doing MBS tomorrow to see about liquid thickness.     I spent a total of  54  minutes on total care today- >50% coordination of care- due to d/w pharmacy, ID pharmacist and SLP and SW about d/c plans as well as ID treatment for possible UTI.     LOS: 20 days A FACE TO FACE EVALUATION WAS PERFORMED  Rachael Ferrie 04/23/2022, 12:35 PM

## 2022-04-24 ENCOUNTER — Inpatient Hospital Stay (HOSPITAL_COMMUNITY): Payer: BLUE CROSS/BLUE SHIELD

## 2022-04-24 DIAGNOSIS — R1310 Dysphagia, unspecified: Secondary | ICD-10-CM

## 2022-04-24 LAB — CBC
HCT: 32.6 % — ABNORMAL LOW (ref 36.0–46.0)
Hemoglobin: 10.3 g/dL — ABNORMAL LOW (ref 12.0–15.0)
MCH: 32.1 pg (ref 26.0–34.0)
MCHC: 31.6 g/dL (ref 30.0–36.0)
MCV: 101.6 fL — ABNORMAL HIGH (ref 80.0–100.0)
Platelets: 185 10*3/uL (ref 150–400)
RBC: 3.21 MIL/uL — ABNORMAL LOW (ref 3.87–5.11)
RDW: 14 % (ref 11.5–15.5)
WBC: 8.9 10*3/uL (ref 4.0–10.5)
nRBC: 0 % (ref 0.0–0.2)

## 2022-04-24 LAB — COMPREHENSIVE METABOLIC PANEL
ALT: 13 U/L (ref 0–44)
AST: 10 U/L — ABNORMAL LOW (ref 15–41)
Albumin: 3.5 g/dL (ref 3.5–5.0)
Alkaline Phosphatase: 62 U/L (ref 38–126)
Anion gap: 5 (ref 5–15)
BUN: 15 mg/dL (ref 6–20)
CO2: 30 mmol/L (ref 22–32)
Calcium: 9.8 mg/dL (ref 8.9–10.3)
Chloride: 107 mmol/L (ref 98–111)
Creatinine, Ser: 0.72 mg/dL (ref 0.44–1.00)
GFR, Estimated: >60 mL/min (ref >60)
Glucose, Bld: 113 mg/dL — ABNORMAL HIGH (ref 70–99)
Potassium: 4.5 mmol/L (ref 3.5–5.1)
Sodium: 142 mmol/L (ref 135–145)
Total Bilirubin: 0.5 mg/dL (ref 0.3–1.2)
Total Protein: 5.9 g/dL — ABNORMAL LOW (ref 6.5–8.1)

## 2022-04-24 LAB — IRON AND TIBC
Iron: 75 ug/dL (ref 28–170)
Saturation Ratios: 25 % (ref 10.4–31.8)
TIBC: 298 ug/dL (ref 250–450)
UIBC: 223 ug/dL

## 2022-04-24 LAB — MAGNESIUM: Magnesium: 2.3 mg/dL (ref 1.7–2.4)

## 2022-04-24 MED ORDER — ACETAMINOPHEN 500 MG PO TABS
1000.0000 mg | ORAL_TABLET | Freq: Three times a day (TID) | ORAL | Status: DC
  Administered 2022-04-24 – 2022-05-04 (×29): 1000 mg via ORAL
  Filled 2022-04-24 (×31): qty 2

## 2022-04-24 MED ORDER — FOSFOMYCIN TROMETHAMINE 3 G PO PACK
3.0000 g | PACK | Freq: Once | ORAL | Status: AC
  Administered 2022-04-24: 3 g via ORAL
  Filled 2022-04-24: qty 3

## 2022-04-24 MED ORDER — RISAQUAD PO CAPS
1.0000 | ORAL_CAPSULE | Freq: Three times a day (TID) | ORAL | Status: DC
  Administered 2022-04-24 – 2022-05-04 (×31): 1 via ORAL
  Filled 2022-04-24 (×31): qty 1

## 2022-04-24 NOTE — Progress Notes (Signed)
Urine culture is growing some enterococcus faecium which will not be covered by cephalexin. Ok to change to 1 dose of fosfomycin per Dr. Carlis Abbott.  Ulyses Southward, PharmD, BCIDP, AAHIVP, CPP Infectious Disease Pharmacist 04/24/2022 1:35 PM

## 2022-04-24 NOTE — Progress Notes (Addendum)
Modified Barium Swallow Progress Note  Patient Details  Name: Sharon Odom MRN: [id number] Date of Birth: 22-Sep-1970  Today's Date: 04/24/2022  Modified Barium Swallow completed.  Full report located under Chart Review in the Imaging Section.  Brief recommendations include the following:  Clinical Impression MBSS completed secondary to decline in pt's functional status with decreased ability to implement compensatory swallowing strategies. Please note, pt limited by head positioning, fatigue, and postural control.   This date, pt presents with moderate-severe oropharyngeal dysphagia c/b weak lingual manipulation, impaired mastication, R anterior oral spillage due to decreased labial strength, incomplete A-P transit, piecemeal deglutition, delayed swallow initiation, poor pharyngeal peristalsis, diminished laryngeal elevation + closure, as well as poor hyolaryngeal excursion resulting in UES dysfunction. Aforementioned deficits resulted in trace silent aspiration of thin liquids post-swallow via the interarytenoid space from pyriform sinus residuals, and cord-level penetration with nectar-thick liquid via straw during the swallow, with silent aspiration unable to be fully r/o. While no penetration nor aspiration was observed with pureed and Dysphagia 2 textures, pt exhibited significant pharyngeal stasis mostly within the vallecular space and along the lateral channels, which was unable to be fully cleared despite use of chin tuck and several liquid rinses; oral suctioning ultimately provided to remove. Pt unable to perform volitional throat clear and/or cough strong enough to clear penetrated and aspirated material.   Given instrumental findings, recommend pureed textures and water only with frequent oral care and providing water first to moisten mouth, if safety is the ultimate goal, given pt has declined alternative means in previous palliative care discussion, and continues to decline this option.  If goals are more comfort, pt requests that she remain on regular diet textures and thin liquids despite education on the risk and ramifications of aspiration. Results reviewed with pt and interdisciplinary team; will f/u with palliative care to discuss findings of evaluation and pt's current wishes to remain on regular diet and thin liquids despite risk of aspiration and malnutrition + hydration. Fortunately, pt's vital signs have remained stable during this decline. Please see imaging section for full report.    Swallow Evaluation Recommendations   SLP Diet Recommendations: Other (Comment);Dysphagia 1 (Puree) solids (thin water only)   Liquid Administration via: Straw   Medication Administration: Crushed with puree   Supervision: Staff to assist with self feeding;Full supervision/cueing for compensatory strategies   Compensations: Minimize environmental distractions;Slow rate;Small sips/bites;Effortful swallow;Multiple dry swallows after each bite/sip;Monitor for anterior loss   Postural Changes: Remain semi-upright after after feeds/meals (Comment);Seated upright at 90 degrees   Oral Care Recommendations: Oral care QID        Rocky Gladden A Stormey Wilborn 04/24/2022,8:19 PM

## 2022-04-24 NOTE — Progress Notes (Addendum)
Patient ID: Sharon Odom, female   DOB: [birth year], 51 y.o.   MRN: [id number]  Met with pt and spoke with husband regarding choosing NH he wants worker to check [facility], [facility] and [facility] since closer to them. Pt did not do well on her swallow and wants to speak with the Palliative person-Alicia since she wants to stay on her regular diet and not on a pureed which is recommended.  Alicia-Palliative to follow up with pt and husband regarding this. Will reach out to facilities and see if can offer a bed.  2:20 PM Facility in [place] is out of network and [facility] declined her waiting for [facility] facility to get back with this Insurance underwriter. Contacted Dave-husband to make aware. Alicia-palliative in to talk with pt regarding her diet preference.

## 2022-04-24 NOTE — Progress Notes (Signed)
PROGRESS NOTE   Subjective/Complaints: Feels fatigued and in pain -says today is a bad day. Discussed checking CBC, CMP, iron, magnesium, and vitamin D and she is agreeable   ROS:  Pt denies SOB, abd pain, CP, N/V/C/D, and vision changes, +diffuse pain  Objective:   VAS [place] LOWER EXTREMITY VENOUS (DVT)  Result Date: 04/23/2022  Lower Venous DVT Study Patient Name:  Sharon Odom  Date of Exam:   04/23/2022 Medical Rec #: [id number]     Accession #:    [id number] Date of Birth: 1971/01/01     Patient Gender: F Patient Age:   51 years Exam Location:  [facility] Procedure:      VAS [place] LOWER EXTREMITY VENOUS (DVT) Referring Phys: Genice Rouge --------------------------------------------------------------------------------  Indications: New swelling and progression of weakness, left in patient with MS.  Comparison Study: 04-04-2019 Prior bilateral lower extremity venous study was                   negative for DVT. Performing Technologist: Jean Rosenthal RDMS, RVT  Examination Guidelines: A complete evaluation includes B-mode imaging, spectral Doppler, color Doppler, and power Doppler as needed of all accessible portions of each vessel. Bilateral testing is considered an integral part of a complete examination. Limited examinations for reoccurring indications may be performed as noted. The reflux portion of the exam is performed with the patient in reverse Trendelenburg.  +-----+---------------+---------+-----------+----------+--------------+ RIGHTCompressibilityPhasicitySpontaneityPropertiesThrombus Aging +-----+---------------+---------+-----------+----------+--------------+ CFV  Full           Yes      Yes                                 +-----+---------------+---------+-----------+----------+--------------+   +---------+---------------+---------+-----------+----------+--------------+ LEFT      CompressibilityPhasicitySpontaneityPropertiesThrombus Aging +---------+---------------+---------+-----------+----------+--------------+ CFV      Full           Yes      Yes                                 +---------+---------------+---------+-----------+----------+--------------+ SFJ      Full                                                        +---------+---------------+---------+-----------+----------+--------------+ FV Prox  Full                                                        +---------+---------------+---------+-----------+----------+--------------+ FV Mid   Full                                                        +---------+---------------+---------+-----------+----------+--------------+  FV DistalFull                                                        +---------+---------------+---------+-----------+----------+--------------+ PFV      Full                                                        +---------+---------------+---------+-----------+----------+--------------+ POP      Full           Yes      Yes                                 +---------+---------------+---------+-----------+----------+--------------+ PTV      Full                                                        +---------+---------------+---------+-----------+----------+--------------+ PERO     Full                                                        +---------+---------------+---------+-----------+----------+--------------+     Summary: RIGHT: - No evidence of common femoral vein obstruction.  LEFT: - There is no evidence of deep vein thrombosis in the lower extremity.  - No cystic structure found in the popliteal fossa.  *See table(s) above for measurements and observations. Electronically signed by Coral Else MD on 04/23/2022 at 9:03:20 PM.    Final    Recent Labs    04/23/22 0525  WBC 6.7  HGB 9.6*  HCT 29.2*  PLT 155     Recent Labs     04/23/22 0525  NA 141  K 4.2  CL 110  CO2 26  GLUCOSE 91  BUN 16  CREATININE 0.66  CALCIUM 9.3      Intake/Output Summary (Last 24 hours) at 04/24/2022 1132 Last data filed at 04/24/2022 1497 Gross per 24 hour  Intake 540 ml  Output 600 ml  Net -60 ml     Pressure Injury 03/31/22 Sacrum Medial Deep Tissue Pressure Injury - Purple or maroon localized area of discolored intact skin or blood-filled blister due to damage of underlying soft tissue from pressure and/or shear. (Active)  03/31/22 0815  Location: Sacrum  Location Orientation: Medial  Staging: Deep Tissue Pressure Injury - Purple or maroon localized area of discolored intact skin or blood-filled blister due to damage of underlying soft tissue from pressure and/or shear.  Wound Description (Comments):   Present on Admission: Yes     Pressure Injury 04/03/22 Thigh Left;Posterior;Proximal Stage 2 -  Partial thickness loss of dermis presenting as a shallow open injury with a red, pink wound bed without slough. red pink (Active)  04/03/22 1751  Location: Thigh  Location Orientation: Left;Posterior;Proximal  Staging: Stage 2 -  Partial thickness loss  of dermis presenting as a shallow open injury with a red, pink wound bed without slough.  Wound Description (Comments): red pink  Present on Admission: Yes    Physical Exam: Vital Signs Blood pressure 111/63, pulse 72, temperature 98.4 F (36.9 C), temperature source Oral, resp. rate 18, height 5\' 9"  (1.753 m), weight 56.2 kg, SpO2 98 %.   General: awake, alert, appropriate, sitting up in bed; R head tilt severe even with propping; needs A to feed self, which is new; NAD HENT: conjugate gaze; oropharynx moist, +cervical dystonia CV: regular rate; no JVD Pulmonary: CTA B/L; no W/R/R- good air movement GI: soft, NT, slightly distended, (+) ITB pump R side Psychiatric: appropriate Neurological: almost aphonic; dysarthric severe- hard to understand; also c/o  hallucinations Extremities: L hand mildly swollen- likely from IV just above L>>R foot/ankle edema MSK: Moving all 4 extremities in bed, BL UE antigravity.  Skin: redness along chest, without urticaria, bleeding, or pustules.   PE from prior encounter:   General: awake, alert, appropriate, sitting up in w/c- head all the way to R- needs better head support on w/c; NAD HENT: conjugate gaze; oropharynx moist CV: regular rate; no JVD Pulmonary: CTA B/L; no W/R/R- good air movement GI: soft, NT, ND, (+)BS- ITB pump in place on R side Psychiatric: appropriate Neurological: Ox3- but still extremely dysarthric and almost aphonic- is worse than originally, but better than 2 days ago  LLE- back to baseline  Skin:    General: Skin is warm and dry.     Comments: New DTI on coccyx  Also worsening skin open/pressure Stage II on posterior thigh- on R thigh Also small DTI on R heel  Neurological:     Mental Status: She is alert.     Comments: MAS 2 to 3 RLE, MAS 1+ LLE  Motor 5/5 RUE 3+ LUE RLE- HF/KE/DF and PF 4-/5- was 2/5 LLE- HF 1 to 2-/5; otherwise 2+/5 which is better than 1/5  MS: Head lolling somewhat to right, but able to bring back to midline without assistance  Assessment/Plan: 1. Functional deficits which require 3+ hours per day of interdisciplinary therapy in a comprehensive inpatient rehab setting. Physiatrist is providing close team supervision and 24 hour management of active medical problems listed below. Physiatrist and rehab team continue to assess barriers to discharge/monitor patient progress toward functional and medical goals  Care Tool:  Bathing    Body parts bathed by patient: Right arm, Left arm, Chest, Abdomen, Face   Body parts bathed by helper: Right lower leg, Left lower leg, Front perineal area, Buttocks, Right upper leg, Left upper leg     Bathing assist Assist Level: Maximal Assistance - Patient 24 - 49%     Upper Body Dressing/Undressing Upper  body dressing   What is the patient wearing?: Pull over shirt    Upper body assist Assist Level: Moderate Assistance - Patient 50 - 74%    Lower Body Dressing/Undressing Lower body dressing      What is the patient wearing?: Underwear/pull up, Pants     Lower body assist Assist for lower body dressing: Maximal Assistance - Patient 25 - 49%     Toileting Toileting Toileting Activity did not occur (Clothing management and hygiene only): N/A (no void or bm)  Toileting assist Assist for toileting: Moderate Assistance - Patient 50 - 74%     Transfers Chair/bed transfer  Transfers assist     Chair/bed transfer assist level: Maximal Assistance - Patient 25 - 49%  Locomotion Ambulation   Ambulation assist   Ambulation activity did not occur: Safety/medical concerns (unable to perform due to pain, weakness and decreased activity tolerance)          Walk 10 feet activity   Assist  Walk 10 feet activity did not occur: Safety/medical concerns (unable to perform due to pain, weakness and decreased activity tolerance)        Walk 50 feet activity   Assist Walk 50 feet with 2 turns activity did not occur: Safety/medical concerns (unable to perform due to pain, weakness and decreased activity tolerance)         Walk 150 feet activity   Assist Walk 150 feet activity did not occur: Safety/medical concerns (unable to perform due to pain, weakness and decreased activity tolerance)         Walk 10 feet on uneven surface  activity   Assist Walk 10 feet on uneven surfaces activity did not occur: Safety/medical concerns (unable to perform due to pain, weakness and decreased activity tolerance)         Wheelchair     Assist Is the patient using a wheelchair?: Yes Type of Wheelchair: Power    Wheelchair assist level: Supervision/Verbal cueing Max wheelchair distance: 300 ft    Wheelchair 50 feet with 2 turns activity    Assist         Assist Level: Supervision/Verbal cueing   Wheelchair 150 feet activity     Assist      Assist Level: Supervision/Verbal cueing   Blood pressure 111/63, pulse 72, temperature 98.4 F (36.9 C), temperature source Oral, resp. rate 18, height  (1.753 m), weight 56.2 kg, SpO2 98 %.  Medical Problem List and Plan: 1. Functional deficits due to secondary progressive multiple sclerosis and debility from prolonged admission on acute floor for encephalopathy             -patient may  shower             -ELOS/Goals: min A to supervision- 7-10 days  Cannot d/c still hallucinating- likely needs SNF  Continue CIR- PT, OT and SLP for swallowing 2.  Impaired mobility: -DVT/anticoagulation:  Pharmaceutical: Lovenox             -antiplatelet therapy: none  - TEDs for BL LE swelling  Dopplers reviewed and are negative for clots.  3. Diffuse pain: not receiving Tylenol regularly, will schedule TID.              -Naproxen 500 mg twice daily             -Tylenol as needed        will change to Nucynta 75 mg q6 hours - will not add Methadone due to chance of polypharmacy   11/14- con't Nucynta- since change didn't increase dose went from 50 mg q4 to 75 mg q6 hours prn-did decrease Am baclofen to 20 mg- con't other doses at 20 mg and reduced doses of zanaflex-tomorrow, if no other issues, will reduce Valium to 2.5 mg q8 hours    11/16- Has UTI- discussed with pt Oxy vs Nucynta- I will not increase nucynta higher dose - but can change back to Oxycodone 10 mg q4 or 15 mg q6- I don't feel comfortable with 15 mg q4 hours  11/17- will increase tylenol to 1000 mg but max 3x/day- DO NOT increase pain meds over weekend, esp with lower BP lately.   11/20- will decrease ITB pump tomorrow and do trigger  point injections of shoulders/upper back/neck for pain control 11/23- TrP injections done 11/21- didn't help pain in head/neck- con't regimen for now  11/27- pt asking to increase Nucynta- if UTI  treated/hallucinations improve, then I think it's reasonable to increase Nucynta to 75 mg QID prn 4. Mood/Behavior/Sleep: LCSW to evaluate and provide emotional support             -antipsychotic agents: n/a             -Wellbutrin XL 300 mg daily             -amantadine 100 mg TID- per Dr Epimenio Foot 5. Neuropsych/cognition: This patient is capable of making decisions on her own behalf. 6. Skin/Wound Care: Routine skin care checks  11/7- has new DTI on coccyx, worsening now stage II on posterior R thigh and small DTI R heel- added prevalon boots in bed; pt will NOT use air mattress- changing to regular bed due to pt insistence; and foam dressing and turn q2 hours.   11/9- pt doesn't like prevalon boots, but educated on the importance 7. Fluids/Electrolytes/Nutrition: Routine Is and Os and follow-up chemistries             -continue Vitamin C, D3, B12 and fiber supplements 8: MS with spasticity: continue dalfampridine BID and [place]              -Baclofen 20 mg twice daily, 40 mg every morning             -Valium 5 mg every 8 hours             -Zanaflex 4 mg twice daily- were reduced due to cognition/sedation on acute- will change out /refill pump Thursday as well as increase dosing of ITB pump  11/8- spasticity slightly more than normal, however increasing ITB pump tomorrow, so will not increase spasticity PO meds  11/9- increasing ITB pump today- and refilling it.   11/10- pump up to 540 mcg/day  11/14- wait on changing dose- discussed with pt x2 about if we increase ITB PUMP, will help spasticity pain, however will make her functionally weaker. Will d/w pt and husband again as well  11/20- will reduce dose tomorrow to prior dose.  11/23- reduced dose to 485 mcg and refill date 09/09/22- on IV solumedrol x 5 days- 11/24- finish IV Solumedrol on 11/25   9: GERD: restart Zantac 10: Neurogenic bladder with indwelling Foley; s/p UTI treatment with Zosyn 11: Bowel program: Nutrisource fiber,  psyllium caps, Colace, MiraLax, Senekot-S given daily             -placed PRN sorbitol and Fleets orders- has gotten really constipated/needing multiple enemas-   11/9- will increase miralax to BID x 3 days since increasing pump- no BM since admission to rehab 11/7  11/14- LBM x2 on 11/12- if no BM by tomorrow, will do Sorbitol and possibly SSE  11/15- medium-large BM today  11/17- if no BM by tomorrow- needs intervention  11/19 Sorbitol ordered  11/23- LBM yesterday  11/24- LBM last night  11/27- LBM yesterday per pt however last documented 11/23- will give Sorbitol to get her ot go after therapy today 12: Dysphagia: ? resolved; now on regular diet; SLP eval  11/27- see below 13: Seasonal allergies off home meds: monitor 14: Tooth abscess: missed follow-up appointment; completed Flagyl 15: Hypokalemia: on K+ supplement QOD; follow-up BMP tomorrow- likely due to previous Lasix dose for Edema.    11/8- K+ 4.1  11/14- K+ 4.3  Recheck tomorrow  11/27- K+ 4.2 16. Insomnia  11/9- will add Trazodone 50 mg QHS and can titrate up as required  11/10- increase to 150 mg QHS  11/14- will decrease to 100 mg QHS since having some confusion  11/17- having weird dreams but doesn't want to stop Trazodone- explained it's from trazodone.  17. - UTI with E coli  11/14- pt asking [place] to check U/A And Cx- since feels getting a UTI- BP on low side as well- asymptomatic per pt  11/15- Pt has significant E Coli UTI- on Zosyn- will con't until sensitivities are back  11/16- Will change to Cefazolin per d/w pharmacy- was completed  18. Confusion/ progressive weakness- due to high dose IV steroids  11/24- finishing steroids tomorrow- Neuro was OK with her continuing since has more strength in RLE and more movement also in LLE  -also needs head support and lateral supports for w/c- PT will address getting for pt.   11/27- pt reports still confused and having hallucinations- that aren't better off steroids- U/A  (+) for leuks, (-) nitrites, rare bacteria, (+) yeast and >50 WBC- doesn't have fever or elevated WBC, however is confused- will try Diflucan 150 mg x1 for yeast and Keflex 500 mg q8 hours for 5 days per ID pharmacist. UC shows enterococcus: Keflex started  -also placed on Seroquel for Confusion by Neurology- will con't  19. Dysphagia  11/27- will order SLP to evaluate Swallowing- they've made her D2 diet- and doing MBS tomorrow to see about liquid thickness.      LOS: 21 days A FACE TO FACE EVALUATION WAS PERFORMED  Drema Pry Eulogia Dismore 04/24/2022, 11:32 AM

## 2022-04-24 NOTE — Progress Notes (Signed)
Palliative:  HPI: 51 y.o. female  with past medical history of relapsing progressive MS, history of baclofen pump infection (November 2022), recent pump replacement (May 2023), thrombocytopenia from [place], anxiety, asthma, and chronic pain admitted on 03/27/2022 from CIR with weakness and fever. Patient is being treated for a UTI with IV Zosyn. MRI of the brain revealed new demyelinated lesions and Solu-Medrol was started.  Neurology was consulted.  Sedating medications were titrated down.  Patient transitioned to inpatient rehab for treatment. PMT was consulted to discuss goals of care.      I was requested to re-visit with Alyse Low at her request. I discussed with Ovidio Kin, CSW regarding decline in swallow function and SLP recommendations for pureed dysphagia 1 with thin liquids but they do not feel that this is what Cristle desires. I met with Kennesha who is resting in her wheelchair. I reviewed with her what I have been told and updated and reassured her that I am here to listen to her wishes so we can ensure we are caring for her in the best way to respect her wishes and maintain her dignity and quality of life as best we can within the circumstances. Noe and I discuss her decline with MS and her decline in swallow function. We discussed the risks involved with having a regular diet including aspiration, pneumonia, trouble breathing. Sejla understands but wishes to continue with regular diet to optimize her quality of life. I also discussed with Georgeanna how we should respond if she were to aspirate or have further complications and decline. Teresia tearfully shares that if she were to decline further she would desire comfort care and not measures to prolong her life. I reassured her that I feel this is very appropriate given her quality of life and we do not want to prolong her suffering. Sahirah and I also discussed hospice options including hospice at home or hospice at a facility when  prognosis expected to be 6 months or less or even hospice facility for people who have prognosis of days to a couple weeks. I did explain that I feel Arleene would be eligible for hospice at home or hospice added to her care at a facility (after rehab if she is approved for rehab). Ura understands. She is appropriately tearful - emotional support provided. We discussed plan to update and discuss with her husband Shanon Brow later today.   Update: I met again at Amelie's bedside along with Shanon Brow as well. With Blossom's permission I updated him on the conversation above that [place] and I had earlier today. Shanon Brow was extremely supportive and very clear to Melanye that he respects any decisions she makes. He acknowledges that he could never know how she really feels but does see all she is going through over these months to years. They were able to acknowledge the fear of the future and fear of dying but the importance of quality of life with dignity and respect. We also reviewed MOST form and completed: DNR, comfort measures, determine use or limitations of antibiotics when infection occurs (she would be okay with antibiotic pill for UTI but not for IV antibiotics with florid pneumonia), she would consider IV fluids for defined trial period, no feeding tube. Shanon Brow is present and supportive of her decisions. They are awaiting update from CSW for facility bed offers and insurance approval.   All questions/concerns addressed. Emotional support provided.   Exam: Alert. Oriented. Appropriately tearful. Generalized weakness. Breathing regular, unlabored. Voice weak. Abd soft.  Plan: - MOST form and completed: DNR, comfort measures, determine use or limitations of antibiotics when infection occurs (she would be okay with antibiotic pill for UTI but not for IV antibiotics with florid pneumonia), she would consider IV fluids for defined trial period, no feeding tube. - Open to SNF rehab. Open to palliative vs hospice  options for additional support.  - Regular diet with acceptance of risks.   695 min  Evalynne Locurto, NP Palliative Medicine Team Pager [phone] (Please see [email or web address] for schedule) Team Phone [phone]    Greater than 50%  of this time was spent counseling and coordinating care related to the above assessment and plan

## 2022-04-24 NOTE — Progress Notes (Signed)
Physical Therapy Weekly Progress Note  Patient Details  Name: Sharon Odom MRN: [id number] Date of Birth: [birth year]  Beginning of progress report period: April 04, 2022 End of progress report period: April 24, 2022   New long term goals created and goals revised due to decline in functional mobility status. Pt currently requires max A/+2 with all mobility for safety. PT recommends d/c to SNF and plan to continue pt/family education to better prepare for transition to skilled facility.   Patient not progressing toward long term goals.  See goal revision..  Plan of care revisions: Goals revised due to decline in functional mobility 2/2 progressive MS .  PT Short Term Goals Week 1:  PT Short Term Goal 1 (Week 1): STG=LTG due ELOS PT Short Term Goal 1 - Progress (Week 1): Progressing toward goal Week 2:  PT Short Term Goal 1 (Week 2): d/c date >7-10 days, STG=LTG due to ELOS PT Short Term Goal 1 - Progress (Week 2): Other (comment) (Revised and new long term goals created due to functional mobility decline) Week 3:  PT Short Term Goal 1 (Week 3): STG=LTG due ELOS and functional decline  Skilled Therapeutic Interventions/Progress Updates:      Therapy Documentation Precautions:  Precautions Precautions: Fall Required Braces or Orthoses: Splint/Cast Splint/Cast: L resting hand splint Restrictions Weight Bearing Restrictions: No    Therapy/Group: Individual Therapy  [name] [name] PT, DPT  04/24/2022, 4:30 PM

## 2022-04-24 NOTE — Progress Notes (Signed)
Physical Therapy Session Note  Patient Details  Name: Sharon Odom MRN: [id number] Date of Birth: 01/27/1971  Today's Date: 04/24/2022 PT Individual Time: 0800-0900, [id number], 1455-1531 PT Individual Time Calculation (min): 60 min, 26 min, 36 min   Short Term Goals: Week 2:  PT Short Term Goal 1 (Week 2): d/c date >7-10 days, STG=LTG due to ELOS  Skilled Therapeutic Interventions/Progress Updates:      Therapy Documentation Precautions:  Precautions Precautions: Fall Required Braces or Orthoses: Splint/Cast Splint/Cast: L resting hand splint Restrictions Weight Bearing Restrictions: No  Treatment Session 1:  Pt agreeable to PT and lethargic throughout session and without verbal complaints of pain. PT educated pt on updated diet to dysphagia 2 and assisted pt with call in for lunch order. Pt requires max A for lower body dressing in bed and for supine to sit with leg lifter. Pt requires total A to maintain upright position sitting edge of bed and with posterior loss of balance that required assistance for recovery. Pt requires max A with sit to stand with stedy and unable to obtain upright position and with significant forward flexion, pt requires total A for recovery  and additional assist from nurse to transfer back to bed as pt unsafe with stedy transfer. PT recommends nursing to utilize maxi move with pt for all mobility. Pt left semi-reclined in bed with nsg present and all needs within reach.    Treatment Session 2:  Pt agreeable to PT and palliative provider present. PT and healthcare provider engaged in discussion regarding discharge disposition and pt's decline in mobility. PT left for 10 minutes to allow palliative care provider to further discuss with patient and returned after conversation commenced.   Treatment Session 3:  On PT arrival, pt agreeable to PT and requested to return to bed and for PT to perform PROM. Pt requires +2 with squat pivot to bed and for sit to  lying. PT performed PROM to bilateral LE's at ankle, knee and hip. Pt left in care of NT to get weight.   Therapy/Group: Individual Therapy  [name] [name] PT, DPT  04/24/2022, 7:35 AM

## 2022-04-24 NOTE — Plan of Care (Signed)
Discontinued mobility goals and created pt/family education goal on transfer training due to functional decline in mobility status 2/2 progressive disorder.   Problem: RH Balance Goal: LTG Patient will maintain dynamic sitting balance (PT) Description: LTG:  Patient will maintain dynamic sitting balance with assistance during mobility activities (PT) Outcome: Not Applicable Note: Discontinue goal due to decline in functional mobility and progression of MS    Problem: Sit to Stand Goal: LTG:  Patient will perform sit to stand with assistance level (PT) Description: LTG:  Patient will perform sit to stand with assistance level (PT) Outcome: Not Applicable Note: Discontinue goal due to decline in functional mobility and progression of MS    Problem: RH Bed to Chair Transfers Goal: LTG Patient will perform bed/chair transfers w/assist (PT) Description: LTG: Patient will perform bed to chair transfers with assistance (PT). Outcome: Not Applicable Note: Discontinue goal due to decline in functional mobility and progression of MS    Problem: RH Pre-functional/Other (Specify) Goal: RH LTG PT (Specify) 1 Description:  RH LTG PT (Specify) 1 Flowsheets (Taken 04/24/2022 1636) LTG: Other PT (Specify) 1: Pt and caregiver will demonstrate competency with bed to chair transfers with LRAD or lift equipment.

## 2022-04-24 NOTE — Patient Care Conference (Signed)
Inpatient RehabilitationTeam Conference and Plan of Care Update Date: 04/24/2022   Time: 11:37 AM    Patient Name: Sharon Odom      Medical Record Number: [id number]  Date of Birth: [birth year] Sex: Female         Room/Bed: 4W09C/4W09C-01 Payor Info: Payor: BLUE [employer]UE [employer] / Plan: BCBS OTHER / Product Type: *No Product type* /    Admit Date/Time:  04/03/2022  4:29 PM  Primary Diagnosis:  Multiple sclerosis [facility])  Hospital Problems: Principal Problem:   Multiple sclerosis (HCC) Active Problems:   Spasticity   Pressure injury of skin    Expected Discharge Date: Expected Discharge Date:  (NHP)  Team Members Present: Physician leading conference: Dr. Faith Rogue Social Worker Present: Dossie Der, LCSW Nurse Present: Vedia Pereyra, RN PT Present: [name], PT OT Present: Valetta Fuller, OT SLP Present: Sarita Bottom, SLP PPS Coordinator present : Fae Pippin, SLP     Current Status/Progress Goal Weekly Team Focus  Bowel/Bladder   Patient has chronic foley catheter. Last BM 11/24.   Pt. will become continent.   monitor changes in bowel and bladder.    Swallow/Nutrition/ Hydration   Mod A - benefits from intermittent feeding assistance. MBSS on 11/28 - scant to trace aspiration of thin liquids, cord-level penetration of nectar-thick liquid can't r/o aspiration over time, and moderate-severe vallecular residuals with puree and Dysphagia 2 textures (Dys 2 > Dys 1). Pt does not want modified diet - Palliative care consulted.   Min A  Diet tolerance    ADL's   pt has maintained same status as last team conference with max A LB self care, max A transfers, mod A UB self care   mod A LB self care, toilet transfer   continue to progress BADL's for safe discharge plan    Mobility   max A bed mobilty, stedy   mod A  d/c planning, pt/fam education, transfers, PWC modifications    Communication   Mod-Max A   Min A   Amplifier, environmental modifications     Safety/Cognition/ Behavioral Observations  Varies - Min A to Mod-Max A per team   Min A - downgraded on 11/13   Basic p-s, emergent awareness, and recall with aids    Pain   Pain has generalized pain. Currently on nucynta prn.   Pain less than 5.   Assess pain q shift prn.    Skin   DTI to sacral area with foam dressing in place and turn q2.   No skin breakdown.  Assess skin q shift and prn.      Discharge Planning:  Pt and husband have decided to pursue SNF-if BCBS will cover this. Husband has to work and is not able to provide the care she needs currently.   Team Discussion: MS. Chronic foley with constipation. Uncontrolled pain. Increased confusion and lethargy. Healing stage II to thigh. DTI to buttocks with attached edges. Will consult Palliative again. UTI. Current weight 55.7kg. Patient declining. Now a maximove. Speech downgraded diet with nursing to assist with feeding.  Patient on target to meet rehab goals: no, patient declining with consult to palliative care.   *See Care Plan and progress notes for long and short-term goals.   Revisions to Treatment Plan:  Medication adjustments, downgraded diet, consult to Palliative care.  Teaching Needs: Medications, safety, transfers, skin/wound care, etc.   Current Barriers to Discharge: Home enviroment access/layout, Incontinence, Neurogenic bowel and bladder, Wound care, Lack of/limited family support, Medication compliance,  and Behavior  Possible Resolutions to Barriers: Family education, nursing education, SNF placement     Medical Summary Current Status: Neuropathic pain, hallucinations, left upper and lower extremity swelling, choking, constipation, anemia  Barriers to Discharge: Medical stability;Uncontrolled Pain  Barriers to Discharge Comments: Neuropathic pain, hallucinations, left upper and lower extremity swelling, choking, constipation, anemia Possible Resolutions to [employer] Focus: hold off on  increasing Nucynta given hallucinations, vascular ultrasound obtained and shows no evidence of clots, monitor CBC weekly, bowel regimen   Continued Need for Acute Rehabilitation Level of Care: The patient requires daily medical management by a physician with specialized training in physical medicine and rehabilitation for the following reasons: Direction of a multidisciplinary physical rehabilitation program to maximize functional independence : Yes Medical management of patient stability for increased activity during participation in an intensive rehabilitation regime.: Yes Analysis of laboratory values and/or radiology reports with any subsequent need for medication adjustment and/or medical intervention. : Yes   I attest that I was present, lead the team conference, and concur with the assessment and plan of the team.   Jearld Adjutant 04/24/2022, 4:48 PM

## 2022-04-24 NOTE — Progress Notes (Signed)
Occupational Therapy Session Note  Patient Details  Name: Ahnna Dungan MRN: [id number] Date of Birth: March 26, 1971  Today's Date: 04/24/2022 OT Individual Time: 1045-1130 OT Individual Time Calculation (min): 45 min    Short Term Goals: Week 4: STG=LTG's d/t LOS   Skilled Therapeutic Interventions/Progress Updates:    Pt seen for skilled OT this am. Pt resting in bed upon OT arrival. Pt just returned from [facility] and posted instructions for now up OOB for all meals. Care coord also from PT to consider Safety Plan downgrade to [facility] transfers with nursing and OT in agreement. OT assisted with transfer via modified squat pivot with max A for bed level to EOB to TIS power w/c. Pt was able to recall positioning feature for lay back setting to allow for readjusting. Pt with overall low tone on L side and weakness on R side especially trunk and neck. Provided set up and min a for oral care. Will update team in conference re: current status and discuss discharge recommendations. Left pt w/c level with all needs and nurse call button in reach.   Therapy Documentation Precautions:  Precautions Precautions: Fall Required Braces or Orthoses: Splint/Cast Splint/Cast: L resting hand splint Restrictions Weight Bearing Restrictions: No    Therapy/Group: Individual Therapy  Vicenta Dunning 04/24/2022, 7:45 AM

## 2022-04-25 LAB — URINE CULTURE: Culture: 50000 — AB

## 2022-04-25 NOTE — Progress Notes (Signed)
Occupational Therapy Session Note  Patient Details  Name: Sharon Odom MRN: [id number] Date of Birth: 07-22-1970  Today's Date: 04/25/2022 OT Individual Time: 1st session [id number] 2nd Session1300-1345 OT Individual Time Calculation (min): 75 min (made up 15 min) 45 min    Short Term Goals: Week 4:Updated STGs=LTGs   Skilled Therapeutic Interventions/Progress Updates:  1st Session:  Pt in bed already set up for oral care and face washing, Improved LOA from yesterday session. Pt able to complete oral care with set up using R UE one handed techniques and face washing as well. AM meal was not correct on tray, therefore new meal ordered. Pt self-directed care for clothing choices with improved organization and vocal strength and volume. LE dressing with max A with OT setting up Foley threading with pt using R UE for pulling up and rolling with rail. Use of reacher and leg lifter. Modified squat pivot from EOB with max A to power w/c. Sink side UB pullover cami and sweatshirt with mod-max A. Difficulty with anterior and forward weight shifts of trunk in w/c. Head positioning with new neck roll and OT to d/w PT calling back ATP for w/c modification of head rest. Left pt in the care of NT to set up with needs and alarm.   2nd Session:  Pt visiting with SIL and niece upon OT arrival. Family to visit SNFs in the area to make discharge plans. Family left at start of session. Pt had not had hair washed for over a week and requested this session for focus with OT integration of trunk and head control for hair washing sink side UE use with R sided functional use and L sided stabilizer. Pt with increased R UE generalized use this pm as pt able to manipulate all hair products, apply with R UE and dry hair with R hand and L hand holding appliance interchangeable. Left pt with NT taking vital signs upon session close.    Therapy Documentation Precautions:  Precautions Precautions: Fall Required Braces or  Orthoses: Splint/Cast Splint/Cast: L resting hand splint Restrictions Weight Bearing Restrictions: No General:   Vital Signs: Therapy Vitals Temp: 98.2 F (36.8 C) Temp Source: Oral Pulse Rate: 67 Resp: 16 BP: 104/60 Patient Position (if appropriate): Lying Oxygen Therapy SpO2: 100 % O2 Device: Room Air Pain:   ADL: ADL Equipment Provided: Long-handled sponge Eating: Set up Where Assessed-Eating: Wheelchair, Bed level Grooming: Setup Where Assessed-Grooming: Sitting at sink Upper Body Bathing: Minimal assistance Where Assessed-Upper Body Bathing: Sitting at sink Lower Body Bathing: Moderate assistance Where Assessed-Lower Body Bathing: Sitting at sink, Standing at sink Upper Body Dressing: Minimal assistance Where Assessed-Upper Body Dressing: Wheelchair Lower Body Dressing: Moderate assistance Where Assessed-Lower Body Dressing: Wheelchair Toileting: Unable to assess Toilet Transfer: Unable to assess Film/video editor: Unable to assess ADL Comments: Pt completed self care routine power w/c level sink side with assisted standing for LB bathing and dressing, uses Foley for voiding and OT will assess toileting next visit as pt did not require this session. Vision   Perception    Praxis   Balance   Exercises:   Other Treatments:     Therapy/Group: Individual Therapy  Vicenta Dunning 04/25/2022, 7:51 AM

## 2022-04-25 NOTE — Progress Notes (Signed)
Received a secure chat from Vella Kohler RN: "Pt Vejar will need Contact Precautions. 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 Thank you." Placed patient on contact precautions and informed nurse tech and charge nurse.

## 2022-04-25 NOTE — Progress Notes (Addendum)
Speech Language Pathology Weekly Progress and Session Note  Patient Details  Name: Sharon Odom MRN: [id number] Date of Birth: 01-Jun-1970  Beginning of progress report period: April 19, 2022 End of progress report period: April 25, 2022  Today's Date: 04/25/2022 SLP Individual Time: 4098-1191 SLP Individual Time Calculation (min): 60 min  Short Term Goals: Week 3: SLP Short Term Goal 1 (Week 3): STG=LTG due to ELOS 11/28 SLP Short Term Goal 1 - Progress (Week 3): Partly met    New Short Term Goals: Week 4: SLP Short Term Goal 1 (Week 4): STG=LTG due to awaiting SNF  Weekly Progress Updates:Pt has made progress advancing towards goals. Pt participated in [facility] on 11/28 indicating severe oral-pharyngeal dysphagia due to exacerbated MS and weakness. SLP recommend thin liquids (water only) and pureed textures due to noted aspiration, but after communication with palliative care and family pt is declining recommendations and is assuming the risk of PO consumption of regular textures and thin liquids. SLP provided education to pt, staff and OT on head positioning to support right lean and reduce risk of aspiration and then d/c swallow goals. Pt is continuing to work on cognitive goals, primarily in memory, then attention and problem solving. Pt most recently scored 16/24 on SLUMS (unable to complete written portion) and is utilizing memory notebook with min-supervision A verbal cues. Speech intelligibility goals at phrase level were also added due to decline. Pt would continue to benefit from skilled ST services in order to maximize functional independence and reduce burden of care, requiring 24 hour supervision at discharge with continued skilled ST services during SNF stay.      Intensity: Minumum of 1-2 x/day, 30 to 90 minutes Frequency: 3 to 5 out of 7 days Duration/Length of Stay: awaiting SNF placement Treatment/Interventions: Patient/family education;Internal/external aids   Daily  Session  Skilled Therapeutic Interventions: Skilled ST services focused on skills. Pt supported desire to continue regular textures and thin liquids and acknowledge risk of aspiration. Pt stated " If I go out choking, I'm okay with that." Pt demonstrated x3 coughing s/s aspiration episodes with head severally leaning to through despite, neck pillow in place. SLP manually supported head to a more neutral position which resulted in no s/s aspiration during a 15 minute interval. Pt supported desire for assistance holding head in neatural position, SLP secure messaged OT to form a more permanent solution and communicated with nurse for a temporary solution of NT manually supporting pt's head during a portion of her meals as well as oral care before and after meals. Pt is agreeable to assume the risk of consuming diet without manual head support when staff is not available if desired. Pt demonstrated recall of today's schedule and yesterday's events with supervision A verbal cues for memory notebook. SLP d/c swallow goals, due to education completed and pt has declined diet recommendations. Pt was left in room with call bell within reach and chair alarm set. SLP recommends to continue skilled services.     General    Pain Pain Assessment Pain Scale: 0-10 Pain Score: 0-No pain Pain Type: Chronic pain Pain Location: Generalized Pain Onset: On-going Pain Intervention(s): Medication (See eMAR);Repositioned  Therapy/Group: Individual Therapy  Madina Galati  [facility] 04/25/2022, 12:57 PM

## 2022-04-25 NOTE — Plan of Care (Signed)
  Problem: RH Swallowing Goal: LTG Patient will consume least restrictive diet using compensatory strategies with assistance (SLP) Description: LTG:  Patient will consume least restrictive diet using compensatory strategies with assistance (SLP) Outcome: Not Applicable Note: D/c due to pt deferring recommendations for diet and confirmed with husband/palliative care

## 2022-04-25 NOTE — Progress Notes (Signed)
Palliative:  I went to visit with Sharon Odom but NT is working on reposition and assistance with meal. Sharon Odom has no questions/concerns for me at this time. I will follow up again tomorrow.   No charge  Yong Channel, NP Palliative Medicine Team Pager [phone] (Please see [email or web address] for schedule) Team Phone [phone]

## 2022-04-25 NOTE — Progress Notes (Signed)
PROGRESS NOTE   Subjective/Complaints: She continues to feel pain all over. Her sister-in-law and niece are at bedside. Reviewed with her her current medications for pain.   ROS:  Pt denies SOB, abd pain, CP, N/V/C/D, and vision changes, +diffuse pain  Objective:   DG Swallowing Func-Speech Pathology  Result Date: 04/24/2022 Table formatting from the original result was not included. Objective Swallowing Evaluation: Type of Study: MBS-Modified Barium Swallow Study  Patient Details Name: Sharon Odom MRN: [id number] Date of Birth: 1970-12-28 Today's Date: 04/24/2022 Time: No data recorded-No data recorded No data recorded Past Medical History: Past Medical History: Diagnosis Date  Allergies   Anxiety   Arthritis   Asthma   seasonal - pollen  Constipation   Depression   Dyspnea   with exertion  GERD (gastroesophageal reflux disease)   Headache   History of hiatal hernia   Left radial nerve palsy 03/15/2020  resolved - Left arm/hand weak  Multiple sclerosis (HCC)   Pneumonia 2007  x 1  Vision abnormalities   Wears glasses  Past Surgical History: Past Surgical History: Procedure Laterality Date  COLONOSCOPY    EXCISION MORTON'S NEUROMA Right   fallopian tube removal    INTRATHECAL PUMP IMPLANT Right 02/18/2019  Procedure: INTRATHECAL PUMP IMPLANT;  Surgeon: Odette Fraction, MD;  Location: [facility] OR;  Service: Neurosurgery;  Laterality: Right;  INTRATHECAL PUMP IMPLANT  INTRATHECAL PUMP IMPLANT Right 02/17/2021  Procedure: Baclofen Pump Replacement;  Surgeon: Maeola Harman, MD;  Location: [facility] OR;  Service: Neurosurgery;  Laterality: Right;  3C/RM 21  INTRATHECAL PUMP REVISION N/A 01/11/2022  Procedure: Baclofen Pump revision;  Surgeon: Dawley, Alan Mulder, DO;  Location: [facility] OR;  Service: Neurosurgery;  Laterality: N/A;  RM 21 to follow  PAIN PUMP IMPLANTATION N/A 09/28/2021  Procedure: Insertion of baclofen pump, right lower quadrant;  Surgeon: Dawley, Alan Mulder, DO;   Location: [facility] OR;  Service: Neurosurgery;  Laterality: N/A;  PAIN PUMP REMOVAL Left 04/06/2021  Procedure: Removal of baclofen pump, Left;  Surgeon: Dawley, Alan Mulder, DO;  Location: [facility] OR;  Service: Neurosurgery;  Laterality: Left;  Lateral/Left//3C rm 19  UPPER GI ENDOSCOPY    WISDOM TOOTH EXTRACTION    WISDOM TOOTH EXTRACTION   HPI: 51 year old female with history of thrombocytopenia from [place], anxiety, asthma and chronic pain, progressive MS , s/p IT pump replacement 8/23. Initially diagnosis with MS 2013. IT pump removed 04/06/21 due to pump infection. Recent admission to [facility] 03/13/22 to fine tune ITB pump as could not be done in visits to outpatient clinic as well as rehabilitation to improve her overall level of function.      [name], she had progressive weakness. Neuro lt consulted and recommended high dose IV steroids. MRI showed new demyelinating lesions. Patient did develop fever with U/A positive for Klebsiella pneumoniae and enterobacter . [name]. Readmitted to acute hospital on 03/27/22.     Neurology consult. Felt MRI showed faint punctuate enhancing lesions on a background of lesions consistent with MS, but lesion did not explain current left sided weakness. Patient has been on multiple sedating medications. Felt likely infection causing worsening of pre-existing deficits, as  well as oversedation from multiple sedating meds. Steroids placed on hold, and sedating meds decreased. Antibiotic changed to Zosyn for 5 day course in the setting of encephalopathy.      Palliative care consulted to begin goals of care discussions for future recommendations.  No data recorded  Recommendations for follow up therapy are one component of a multi-disciplinary discharge planning process, led by the attending physician.  Recommendations may be updated based on patient status, additional functional criteria and insurance authorization. Assessment / Plan / Recommendation    04/24/2022   8:18 PM Clinical Impressions Clinical Impression MBSS completed secondary to decline in pt's functional status with decreased ability to implement compensatory swallowing strategies. Please note, pt limited by head positioning, fatigue, and postural control. This date, pt presents with moderate-severe oropharyngeal dysphagia c/b weak lingual manipulation, impaired mastication, R anterior oral spillage due to decreased labial strength, incomplete A-P transit, piecemeal deglutition, delayed swallow initiation, poor pharyngeal peristalsis, diminished laryngeal elevation + closure, as well as poor hyolaryngeal excursion resulting in UES dysfunction. Aforementioned deficits resulted in trace silent aspiration of thin liquids post-swallow via the interarytenoid space from pyriform sinus residuals, and cord-level penetration with nectar-thick liquid via straw during the swallow, with silent aspiration unable to be fully r/o. While no penetration nor aspiration was observed with pureed and Dysphagia 2 textures, pt exhibited significant pharyngeal stasis mostly within the vallecular space and along the lateral channels, which was unable to be fully cleared despite use of chin tuck and several liquid rinses; oral suctioning ultimately provided to remove. Pt unable to perform volitional throat clear and/or cough strong enough to clear penetrated and aspirated material. Given instrumental findings, recommend pureed textures and water only with frequent oral care and providing water first to moisten mouth, if safety is the ultimate goal. If goals are more comfort, pt requests that she remain on regular diet textures and thin liquids despite education on the risk and ramifications of aspiration. Results reviewed with pt and interdisciplinary team; will f/u with palliative care to discuss findings of evaluation and pt's current wishes to remain on regular diet and thin liquids despite risk of aspiration and  malnutrition + hydration. Fortunately, vital signs and respiratory status has remained stable with this decline.  SLP Visit Diagnosis Dysphagia, oropharyngeal phase (R13.12);Dysarthria and anarthria (R47.1);Cognitive communication deficit (R41.841) Impact on safety and function Moderate aspiration risk;Severe aspiration risk     06/18/2021  11:21 AM Treatment Recommendations Treatment Recommendations Therapy as outlined in treatment plan below     04/24/2022   8:19 PM Prognosis Prognosis for Safe Diet Advancement Guarded Barriers to Reach Goals Severity of deficits;Time post onset;Medication   04/24/2022   8:18 PM Diet Recommendations SLP Diet Recommendations Other (Comment);Dysphagia 1 (Puree) solids Liquid Administration via Straw Medication Administration Crushed with puree Compensations Minimize environmental distractions;Slow rate;Small sips/bites;Effortful swallow;Multiple dry swallows after each bite/sip;Monitor for anterior loss Postural Changes Remain semi-upright after after feeds/meals (Comment);Seated upright at 90 degrees     04/24/2022   8:18 PM Other Recommendations Oral Care Recommendations Oral care QID   06/18/2021  11:21 AM Frequency and Duration  Speech Therapy Frequency (ACUTE ONLY) min 2x/week Treatment Duration 2 weeks     04/24/2022   8:12 PM Oral Phase Oral Phase Impaired Oral - Nectar Teaspoon NT Oral - Nectar Cup NT Oral - Nectar Straw Weak lingual manipulation;Decreased bolus cohesion;Piecemeal swallowing;Premature spillage;Reduced posterior propulsion;Lingual/palatal residue Oral - Thin Cup Weak lingual manipulation;Piecemeal swallowing;Decreased bolus cohesion;Premature spillage;Lingual/palatal residue;Reduced posterior propulsion;Right anterior bolus loss Oral -  Thin Straw Weak lingual manipulation;Piecemeal swallowing;Decreased bolus cohesion;Premature spillage;Reduced posterior propulsion;Lingual/palatal residue Oral - Puree Weak lingual manipulation;Lingual pumping;Delayed oral  transit;Decreased bolus cohesion;Premature spillage;Lingual/palatal residue;Reduced posterior propulsion Oral - Mech Soft Weak lingual manipulation;Piecemeal swallowing;Decreased bolus cohesion;Delayed oral transit;Premature spillage;Reduced posterior propulsion;Lingual/palatal residue;Impaired mastication Oral - Regular NT Oral - Multi-Consistency NT Oral - Pill NT    04/24/2022   8:14 PM Pharyngeal Phase Pharyngeal Phase Impaired Pharyngeal- Nectar Teaspoon NT Pharyngeal- Nectar Cup NT Pharyngeal- Nectar Straw Delayed swallow initiation-vallecula;Reduced pharyngeal peristalsis;Reduced anterior laryngeal mobility;Reduced laryngeal elevation;Reduced airway/laryngeal closure;Penetration/Aspiration during swallow;Pharyngeal residue - valleculae;Pharyngeal residue - pyriform;Inter-arytenoid space residue;Lateral channel residue;Reduced tongue base retraction Pharyngeal Material enters airway, CONTACTS cords and not ejected out Pharyngeal- Thin Teaspoon Delayed swallow initiation-vallecula;Reduced pharyngeal peristalsis;Reduced anterior laryngeal mobility;Reduced laryngeal elevation;Reduced airway/laryngeal closure;Reduced tongue base retraction;Penetration/Apiration after swallow;Trace aspiration;Pharyngeal residue - valleculae;Pharyngeal residue - pyriform;Inter-arytenoid space residue Pharyngeal Material enters airway, passes BELOW cords without attempt by patient to eject out (silent aspiration) Pharyngeal- Thin Cup NT Pharyngeal- Thin Straw Delayed swallow initiation-vallecula;Reduced pharyngeal peristalsis;Reduced anterior laryngeal mobility;Reduced laryngeal elevation;Reduced airway/laryngeal closure;Penetration/Apiration after swallow;Penetration/Aspiration during swallow;Trace aspiration;Reduced tongue base retraction;Pharyngeal residue - valleculae;Pharyngeal residue - pyriform;Inter-arytenoid space residue Pharyngeal Material enters airway, passes BELOW cords without attempt by patient to eject out (silent  aspiration) Pharyngeal- Puree Delayed swallow initiation-vallecula;Reduced pharyngeal peristalsis;Reduced laryngeal elevation;Reduced anterior laryngeal mobility;Reduced tongue base retraction;Pharyngeal residue - valleculae;Lateral channel residue Pharyngeal Material does not enter airway Pharyngeal- Mechanical Soft Delayed swallow initiation-vallecula;Reduced pharyngeal peristalsis;Reduced anterior laryngeal mobility;Reduced laryngeal elevation;Reduced tongue base retraction;Pharyngeal residue - valleculae;Lateral channel residue Pharyngeal Material does not enter airway Pharyngeal- Regular NT Pharyngeal- Multi-consistency NT Pharyngeal- Pill NT    04/24/2022   8:17 PM Cervical Esophageal Phase  Cervical Esophageal Phase Impaired Nectar Teaspoon NT Nectar Cup NT Nectar Straw Reduced cricopharyngeal relaxation Thin Teaspoon Reduced cricopharyngeal relaxation Thin Cup NT Thin Straw Reduced cricopharyngeal relaxation Puree Reduced cricopharyngeal relaxation Mechanical Soft Reduced cricopharyngeal relaxation Regular NT Multi-consistency NT Pill NT Bethany A Lutes 04/24/2022, 8:20 PM                     VAS [place] LOWER EXTREMITY VENOUS (DVT)  Result Date: 04/23/2022  Lower Venous DVT Study Patient Name:  Sharon Odom  Date of Exam:   04/23/2022 Medical Rec #: [id number]     Accession #:    [id number] Date of Birth: 1971/05/09     Patient Gender: F Patient Age:   43 years Exam Location:  [facility] Procedure:      VAS [place] LOWER EXTREMITY VENOUS (DVT) Referring Phys: Genice Rouge --------------------------------------------------------------------------------  Indications: New swelling and progression of weakness, left in patient with MS.  Comparison Study: 04-04-2019 Prior bilateral lower extremity venous study was                   negative for DVT. Performing Technologist: Jean Rosenthal RDMS, RVT  Examination Guidelines: A complete evaluation includes B-mode imaging, spectral Doppler, color Doppler, and power  Doppler as needed of all accessible portions of each vessel. Bilateral testing is considered an integral part of a complete examination. Limited examinations for reoccurring indications may be performed as noted. The reflux portion of the exam is performed with the patient in reverse Trendelenburg.  +-----+---------------+---------+-----------+----------+--------------+ RIGHTCompressibilityPhasicitySpontaneityPropertiesThrombus Aging +-----+---------------+---------+-----------+----------+--------------+ CFV  Full           Yes      Yes                                 +-----+---------------+---------+-----------+----------+--------------+   +---------+---------------+---------+-----------+----------+--------------+  LEFT     CompressibilityPhasicitySpontaneityPropertiesThrombus Aging +---------+---------------+---------+-----------+----------+--------------+ CFV      Full           Yes      Yes                                 +---------+---------------+---------+-----------+----------+--------------+ SFJ      Full                                                        +---------+---------------+---------+-----------+----------+--------------+ FV Prox  Full                                                        +---------+---------------+---------+-----------+----------+--------------+ FV Mid   Full                                                        +---------+---------------+---------+-----------+----------+--------------+ FV DistalFull                                                        +---------+---------------+---------+-----------+----------+--------------+ PFV      Full                                                        +---------+---------------+---------+-----------+----------+--------------+ POP      Full           Yes      Yes                                  +---------+---------------+---------+-----------+----------+--------------+ PTV      Full                                                        +---------+---------------+---------+-----------+----------+--------------+ PERO     Full                                                        +---------+---------------+---------+-----------+----------+--------------+     Summary: RIGHT: - No evidence of common femoral vein obstruction.  LEFT: - There is no evidence of deep vein thrombosis in the lower extremity.  - No cystic structure found in the popliteal fossa.  *See table(s) above for measurements and observations.  Electronically signed by Coral Else MD on 04/23/2022 at 9:03:20 PM.    Final    Recent Labs    04/23/22 0525 04/24/22 1316  WBC 6.7 8.9  HGB 9.6* 10.3*  HCT 29.2* 32.6*  PLT 155 185     Recent Labs    04/23/22 0525 04/24/22 1316  NA 141 142  K 4.2 4.5  CL 110 107  CO2 26 30  GLUCOSE 91 113*  BUN 16 15  CREATININE 0.66 0.72  CALCIUM 9.3 9.8      Intake/Output Summary (Last 24 hours) at 04/25/2022 1101 Last data filed at 04/25/2022 0800 Gross per 24 hour  Intake 420 ml  Output 700 ml  Net -280 ml     Pressure Injury 03/31/22 Sacrum Medial Deep Tissue Pressure Injury - Purple or maroon localized area of discolored intact skin or blood-filled blister due to damage of underlying soft tissue from pressure and/or shear. (Active)  03/31/22 0815  Location: Sacrum  Location Orientation: Medial  Staging: Deep Tissue Pressure Injury - Purple or maroon localized area of discolored intact skin or blood-filled blister due to damage of underlying soft tissue from pressure and/or shear.  Wound Description (Comments):   Present on Admission: Yes     Pressure Injury 04/03/22 Thigh Left;Posterior;Proximal Stage 2 -  Partial thickness loss of dermis presenting as a shallow open injury with a red, pink wound bed without slough. red pink (Active)  04/03/22 1751   Location: Thigh  Location Orientation: Left;Posterior;Proximal  Staging: Stage 2 -  Partial thickness loss of dermis presenting as a shallow open injury with a red, pink wound bed without slough.  Wound Description (Comments): red pink  Present on Admission: Yes    Physical Exam: Vital Signs Blood pressure 104/60, pulse 67, temperature 98.2 F (36.8 C), temperature source Oral, resp. rate 16, height 5\' 9"  (1.753 m), weight 55.7 kg, SpO2 100 %.   General: awake, alert, appropriate, sitting up in w/c- head all the way to R- needs better head support on w/c; NAD HENT: conjugate gaze; oropharynx moist, +cervical dystonia CV: regular rate; no JVD Pulmonary: CTA B/L; no W/R/R- good air movement GI: soft, NT, ND, (+)BS- ITB pump in place on R side Psychiatric: appropriate Neurological: Ox3- but still extremely dysarthric and almost aphonic- is worse than originally, but better than 2 days ago  LLE- back to baseline  Skin:    General: Skin is warm and dry.     Comments: New DTI on coccyx  Also worsening skin open/pressure Stage II on posterior thigh- on R thigh Also small DTI on R heel  Neurological:     Mental Status: She is alert.     Comments: MAS 2 to 3 RLE, MAS 1+ LLE  Motor 5/5 RUE 3+ LUE RLE- HF/KE/DF and PF 4-/5- was 2/5 LLE- HF 1 to 2-/5; otherwise 2+/5 which is better than 1/5  MS: Head lolling somewhat to right, but able to bring back to midline without assistance  Assessment/Plan: 1. Functional deficits which require 3+ hours per day of interdisciplinary therapy in a comprehensive inpatient rehab setting. Physiatrist is providing close team supervision and 24 hour management of active medical problems listed below. Physiatrist and rehab team continue to assess barriers to discharge/monitor patient progress toward functional and medical goals  Care Tool:  Bathing    Body parts bathed by patient: Right arm, Left arm, Chest, Abdomen, Face   Body parts bathed by  helper: Right lower leg, Left lower leg, Front  perineal area, Buttocks, Right upper leg, Left upper leg     Bathing assist Assist Level: Maximal Assistance - Patient 24 - 49%     Upper Body Dressing/Undressing Upper body dressing   What is the patient wearing?: Pull over shirt    Upper body assist Assist Level: Moderate Assistance - Patient 50 - 74%    Lower Body Dressing/Undressing Lower body dressing      What is the patient wearing?: Underwear/pull up, Pants     Lower body assist Assist for lower body dressing: Maximal Assistance - Patient 25 - 49%     Toileting Toileting Toileting Activity did not occur (Clothing management and hygiene only): N/A (no void or bm)  Toileting assist Assist for toileting: Moderate Assistance - Patient 50 - 74%     Transfers Chair/bed transfer  Transfers assist     Chair/bed transfer assist level: Maximal Assistance - Patient 25 - 49%     Locomotion Ambulation   Ambulation assist   Ambulation activity did not occur: Safety/medical concerns (unable to perform due to pain, weakness and decreased activity tolerance)          Walk 10 feet activity   Assist  Walk 10 feet activity did not occur: Safety/medical concerns (unable to perform due to pain, weakness and decreased activity tolerance)        Walk 50 feet activity   Assist Walk 50 feet with 2 turns activity did not occur: Safety/medical concerns (unable to perform due to pain, weakness and decreased activity tolerance)         Walk 150 feet activity   Assist Walk 150 feet activity did not occur: Safety/medical concerns (unable to perform due to pain, weakness and decreased activity tolerance)         Walk 10 feet on uneven surface  activity   Assist Walk 10 feet on uneven surfaces activity did not occur: Safety/medical concerns (unable to perform due to pain, weakness and decreased activity tolerance)         Wheelchair     Assist Is the  patient using a wheelchair?: Yes Type of Wheelchair: Power    Wheelchair assist level: Supervision/Verbal cueing Max wheelchair distance: 300 ft    Wheelchair 50 feet with 2 turns activity    Assist        Assist Level: Supervision/Verbal cueing   Wheelchair 150 feet activity     Assist      Assist Level: Supervision/Verbal cueing   Blood pressure 104/60, pulse 67, temperature 98.2 F (36.8 C), temperature source Oral, resp. rate 16, height 5\' 9"  (1.753 m), weight 55.7 kg, SpO2 100 %.  Medical Problem List and Plan: 1. Functional deficits due to secondary progressive multiple sclerosis and debility from prolonged admission on acute floor for encephalopathy             -patient may  shower             -ELOS/Goals: min A to supervision- 22 days  Cannot d/c still hallucinating- likely needs SNF  Continue CIR- PT, OT and SLP for swallowing 2.  Impaired mobility: continue Lovenox             -antiplatelet therapy: none  - TEDs for BL LE swelling  Dopplers reviewed and are negative for clots.  3. Diffuse pain: not receiving Tylenol regularly, will schedule TID.              -Naproxen 500 mg twice daily             -  Tylenol as needed        will change to Nucynta 75 mg q6 hours - will not add Methadone due to chance of polypharmacy   -discussed that most pain medications aside from the Tylenol and Nucynta could negatively affect cognition.   11/14- con't Nucynta- since change didn't increase dose went from 50 mg q4 to 75 mg q6 hours prn-did decrease Am baclofen to 20 mg- con't other doses at 20 mg and reduced doses of zanaflex-tomorrow, if no other issues, will reduce Valium to 2.5 mg q8 hours    11/16- Has UTI- discussed with pt Oxy vs Nucynta- I will not increase nucynta higher dose - but can change back to Oxycodone 10 mg q4 or 15 mg q6- I don't feel comfortable with 15 mg q4 hours  11/17- will increase tylenol to 1000 mg but max 3x/day- DO NOT increase pain meds over  weekend, esp with lower BP lately.   11/20- will decrease ITB pump tomorrow and do trigger point injections of shoulders/upper back/neck for pain control 11/23- TrP injections done 11/21- didn't help pain in head/neck- con't regimen for now  11/27- pt asking to increase Nucynta- if UTI treated/hallucinations improve, then I think it's reasonable to increase Nucynta to 75 mg QID prn 4. Depression: continue Wellbutrin XL 300 mg daily             -amantadine 100 mg TID- per Dr Epimenio Foot 5. Neuropsych/cognition: This patient is capable of making decisions on her own behalf. 6. Skin/Wound Care: Routine skin care checks  11/7- has new DTI on coccyx, worsening now stage II on posterior R thigh and small DTI R heel- added prevalon boots in bed; pt will NOT use air mattress- changing to regular bed due to pt insistence; and foam dressing and turn q2 hours.   11/9- pt doesn't like prevalon boots, but educated on the importance 7. Fluids/Electrolytes/Nutrition: Routine Is and Os and follow-up chemistries             -continue Vitamin C, D3, B12 and fiber supplements 8: MS with spasticity: continue dalfampridine BID and [place]              -Baclofen 20 mg twice daily, 40 mg every morning             -Valium 5 mg every 8 hours             -Zanaflex 4 mg twice daily- were reduced due to cognition/sedation on acute- will change out /refill pump Thursday as well as increase dosing of ITB pump  11/8- spasticity slightly more than normal, however increasing ITB pump tomorrow, so will not increase spasticity PO meds  11/9- increasing ITB pump today- and refilling it.   11/10- pump up to 540 mcg/day  11/14- wait on changing dose- discussed with pt x2 about if we increase ITB PUMP, will help spasticity pain, however will make her functionally weaker. Will d/w pt and husband again as well  11/20- will reduce dose tomorrow to prior dose.  11/23- reduced dose to 485 mcg and refill date 09/09/22- on IV solumedrol x 5  days- 11/24- finish IV Solumedrol on 11/25   9: GERD: restart Zantac 10: Neurogenic bladder with indwelling Foley; s/p UTI treatment with Zosyn 11: Bowel program: Nutrisource fiber, psyllium caps, Colace, MiraLax, Senekot-S given daily             -placed PRN sorbitol and Fleets orders- has gotten really constipated/needing multiple enemas-   11/9- will  increase miralax to BID x 3 days since increasing pump- no BM since admission to rehab 11/7  11/14- LBM x2 on 11/12- if no BM by tomorrow, will do Sorbitol and possibly SSE  11/15- medium-large BM today  11/17- if no BM by tomorrow- needs intervention  11/19 Sorbitol ordered  11/23- LBM yesterday  11/24- LBM last night  11/27- LBM yesterday per pt however last documented 11/23- will give Sorbitol to get her ot go after therapy today 12: Dysphagia: ? resolved; now on regular diet; SLP eval  11/27- see below 13: Seasonal allergies off home meds: monitor 14: Tooth abscess: missed follow-up appointment; completed Flagyl 15: Hypokalemia: on K+ supplement QOD; follow-up BMP tomorrow- likely due to previous Lasix dose for Edema.    11/8- K+ 4.1  11/14- K+ 4.3  Recheck tomorrow  11/27- K+ 4.2 16. Insomnia  11/9- will add Trazodone 50 mg QHS and can titrate up as required  11/10- increase to 150 mg QHS  11/14- will decrease to 100 mg QHS since having some confusion  11/17- having weird dreams but doesn't want to stop Trazodone- explained it's from trazodone.  17. - UTI with E coli  11/14- pt asking [place] to check U/A And Cx- since feels getting a UTI- BP on low side as well- asymptomatic per pt  11/15- Pt has significant E Coli UTI- on Zosyn- will con't until sensitivities are back  11/16- Will change to Cefazolin per d/w pharmacy- was completed  18. Confusion/ progressive weakness- due to high dose IV steroids  11/24- finishing steroids tomorrow- Neuro was OK with her continuing since has more strength in RLE and more movement also in  LLE  -also needs head support and lateral supports for w/c- PT will address getting for pt.   11/27- pt reports still confused and having hallucinations- that aren't better off steroids- U/A (+) for leuks, (-) nitrites, rare bacteria, (+) yeast and >50 WBC- doesn't have fever or elevated WBC, however is confused- will try Diflucan 150 mg x1 for yeast and Keflex 500 mg q8 hours for 5 days per ID pharmacist. UC shows enterococcus: Keflex started  -also placed on Seroquel for Confusion by Neurology- will con't  19. Dysphagia  11/27- will order SLP to evaluate Swallowing- they've made her D2 diet- and doing MBS tomorrow to see about liquid thickness.      LOS: 22 days A FACE TO FACE EVALUATION WAS PERFORMED  Felice Deem P Elizebath Wever 04/25/2022, 11:01 AM

## 2022-04-25 NOTE — Progress Notes (Signed)
Physical Therapy Session Note  Patient Details  Name: Sharon Odom MRN: [id number] Date of Birth: 1970/08/08  Today's Date: 04/25/2022 PT Individual Time: 1345-1435 PT Individual Time Calculation (min): 50 min   Short Term Goals: Week 3:  PT Short Term Goal 1 (Week 3): STG=LTG due ELOS and functional decline  Skilled Therapeutic Interventions/Progress Updates:      Therapy Documentation Precautions:  Precautions Precautions: Fall Required Braces or Orthoses: Splint/Cast Splint/Cast: L resting hand splint Restrictions Weight Bearing Restrictions: No   Pt received seated at sink performing self-grooming tasks with nursing and agreeable to PT session. Pt without verbal complaints of pain in session. Pt requests to go to gym to work on LE strengthening. Pt navigated PWC supervision to dayroom and engaged in LAQ with 2.5# weight 1 x 15. PT engaged in discharge plan discussion. Pt reports her sister-in-law will visit a SNF tomorrow to determine if it would be an appropriate fit. Pt alternate plan is to go home on hospice with care during hours her spouse is work. Plan to perform family education on hoyer training on Sunday with spouse and discussed with SW. Pt returned to room and left seated in PWC at bedside with chair alarm on and all needs within reach.    Therapy/Group: Individual Therapy  [name] [name] PT, DPT  04/25/2022, 7:49 AM

## 2022-04-25 NOTE — Progress Notes (Signed)
Patient ID: Sharon Odom, female   DOB: 04/05/1971, 51 y.o.   MRN: [id number]  Spoke with husband to update him have finally spoken with [facility] and she will look at the clinicals and get back with this worker. He reports his sister and niece are here and will assist with looking at the SNF's a bed offer has been made. Will work on bed offer and then insurance coverage all aware insurance may not cover SNF.

## 2022-04-26 MED ORDER — MELOXICAM 7.5 MG PO TABS
15.0000 mg | ORAL_TABLET | Freq: Every day | ORAL | Status: DC
  Administered 2022-04-26 – 2022-05-04 (×9): 15 mg via ORAL
  Filled 2022-04-26 (×11): qty 2

## 2022-04-26 NOTE — Progress Notes (Addendum)
Occupational Therapy Session Note  Patient Details  Name: Sharon Odom MRN: [id number] Date of Birth: 1970-12-20  Today's Date: 04/26/2022 OT Individual Time: [phone] 1st session, 1500-1530 2nd session  OT Individual Time Calculation (min): 45 min, 30 min (missed 15 min due to visitor)    Short Term Goals: Week 4: STG=LTG d/t LOS   Skilled Therapeutic Interventions/Progress Updates:    1st Session:   OT arrival for session with pt bed level with Foley care completed by NT earlier. Pt now on contact prec for VRE.  Pt  already set up for oral care and face washing again this am by ST. Pt self-directed care for clothing choices and performed with facilitation of OT, leg lifter and reacher use with R UE  to assist L sided weakness. LE dressing with max A with OT setting up Foley threading with pt using R UE for pulling up and rolling with rail. UB dressing with pt able to doff cami and pullover shrt with mod a with increased time and effort and donned clean cami and pullover with mod-max a and min cues using hemi techniques. Set up PWC for PT session next. Bed exit alarm engaged, needs and nurse call button in reach.   2nd Session:   Pt seen for skilled OT session with 15 minutes missed due to SIL visiting. Upon OT arrival, OT worked with SIL briefly prior to her leaving on head control strategies and reported ATP for w/c should be coming out to address head rest modifications however we would need to use neck rolls and pillows as well as tilt feature of PWC in the meantime. SIL and pt demonstrated understanding. OT also addressed energy conservation and need for rest during the day to break up prolonged feeding times which are seemingly causing excessive fatigue. OT provided soft tissue massage and gentle facilitation to neck mm and placed pt in full tilt at end of session. Chair alrm engaged, needs and nurse call button in reach.    Therapy Documentation Precautions:   Precautions Precautions: Fall Required Braces or Orthoses: Splint/Cast Splint/Cast: L resting hand splint Restrictions Weight Bearing Restrictions: No   Therapy/Group: Individual Therapy  Vicenta Dunning 04/26/2022, 7:46 AM

## 2022-04-26 NOTE — Progress Notes (Signed)
PROGRESS NOTE   Subjective/Complaints: No new complaints this morning She is working with SLP She asks for how long she will have to be on precautions   ROS:  Pt denies SOB, abd pain, CP, N/V/C/D, and vision changes, +diffuse pain, +cervical dystonia  Objective:   No results found. Recent Labs    04/24/22 1316  WBC 8.9  HGB 10.3*  HCT 32.6*  PLT 185     Recent Labs    04/24/22 1316  NA 142  K 4.5  CL 107  CO2 30  GLUCOSE 113*  BUN 15  CREATININE 0.72  CALCIUM 9.8      Intake/Output Summary (Last 24 hours) at 04/26/2022 1030 Last data filed at 04/26/2022 0600 Gross per 24 hour  Intake 420 ml  Output 825 ml  Net -405 ml     Pressure Injury 03/31/22 Sacrum Medial Deep Tissue Pressure Injury - Purple or maroon localized area of discolored intact skin or blood-filled blister due to damage of underlying soft tissue from pressure and/or shear. (Active)  03/31/22 0815  Location: Sacrum  Location Orientation: Medial  Staging: Deep Tissue Pressure Injury - Purple or maroon localized area of discolored intact skin or blood-filled blister due to damage of underlying soft tissue from pressure and/or shear.  Wound Description (Comments):   Present on Admission: Yes     Pressure Injury 04/03/22 Thigh Left;Posterior;Proximal Stage 2 -  Partial thickness loss of dermis presenting as a shallow open injury with a red, pink wound bed without slough. red pink (Active)  04/03/22 1751  Location: Thigh  Location Orientation: Left;Posterior;Proximal  Staging: Stage 2 -  Partial thickness loss of dermis presenting as a shallow open injury with a red, pink wound bed without slough.  Wound Description (Comments): red pink  Present on Admission: Yes    Physical Exam: Vital Signs Blood pressure (!) 106/54, pulse 63, temperature 98 F (36.7 C), temperature source Oral, resp. rate 16, height 5\' 9"  (1.753 m), weight 55.7 kg,  SpO2 100 %.   General: awake, alert, appropriate, sitting up in w/c- head all the way to R- needs better head support on w/c; NAD HENT: conjugate gaze; oropharynx moist, +cervical dystonia with head tilt to right CV: regular rate; no JVD Pulmonary: CTA B/L; no W/R/R- good air movement GI: soft, NT, ND, (+)BS- ITB pump in place on R side Psychiatric: appropriate Neurological: Ox3- but still extremely dysarthric and almost aphonic- is worse than originally, but better than 2 days ago  LLE- back to baseline  Skin:    General: Skin is warm and dry.     Comments: New DTI on coccyx  Also worsening skin open/pressure Stage II on posterior thigh- on R thigh Also small DTI on R heel  Neurological:     Mental Status: She is alert.     Comments: MAS 2 to 3 RLE, MAS 1+ LLE  Motor 5/5 RUE 3+ LUE RLE- HF/KE/DF and PF 4-/5- was 2/5 LLE- HF 1 to 2-/5; otherwise 2+/5 which is better than 1/5  MS: Head lolling somewhat to right, but able to bring back to midline without assistance  Assessment/Plan: 1. Functional deficits which require 3+ hours  per day of interdisciplinary therapy in a comprehensive inpatient rehab setting. Physiatrist is providing close team supervision and 24 hour management of active medical problems listed below. Physiatrist and rehab team continue to assess barriers to discharge/monitor patient progress toward functional and medical goals  Care Tool:  Bathing    Body parts bathed by patient: Right arm, Left arm, Chest, Abdomen, Face   Body parts bathed by helper: Right lower leg, Left lower leg, Front perineal area, Buttocks, Right upper leg, Left upper leg     Bathing assist Assist Level: Maximal Assistance - Patient 24 - 49%     Upper Body Dressing/Undressing Upper body dressing   What is the patient wearing?: Pull over shirt    Upper body assist Assist Level: Moderate Assistance - Patient 50 - 74%    Lower Body Dressing/Undressing Lower body dressing       What is the patient wearing?: Underwear/pull up, Pants     Lower body assist Assist for lower body dressing: Maximal Assistance - Patient 25 - 49%     Toileting Toileting Toileting Activity did not occur (Clothing management and hygiene only): N/A (no void or bm)  Toileting assist Assist for toileting: Moderate Assistance - Patient 50 - 74%     Transfers Chair/bed transfer  Transfers assist     Chair/bed transfer assist level: Maximal Assistance - Patient 25 - 49%     Locomotion Ambulation   Ambulation assist   Ambulation activity did not occur: Safety/medical concerns (unable to perform due to pain, weakness and decreased activity tolerance)          Walk 10 feet activity   Assist  Walk 10 feet activity did not occur: Safety/medical concerns (unable to perform due to pain, weakness and decreased activity tolerance)        Walk 50 feet activity   Assist Walk 50 feet with 2 turns activity did not occur: Safety/medical concerns (unable to perform due to pain, weakness and decreased activity tolerance)         Walk 150 feet activity   Assist Walk 150 feet activity did not occur: Safety/medical concerns (unable to perform due to pain, weakness and decreased activity tolerance)         Walk 10 feet on uneven surface  activity   Assist Walk 10 feet on uneven surfaces activity did not occur: Safety/medical concerns (unable to perform due to pain, weakness and decreased activity tolerance)         Wheelchair     Assist Is the patient using a wheelchair?: Yes Type of Wheelchair: Power    Wheelchair assist level: Supervision/Verbal cueing Max wheelchair distance: 300 ft    Wheelchair 50 feet with 2 turns activity    Assist        Assist Level: Supervision/Verbal cueing   Wheelchair 150 feet activity     Assist      Assist Level: Supervision/Verbal cueing   Blood pressure (!) 106/54, pulse 63, temperature 98 F (36.7 C),  temperature source Oral, resp. rate 16, height 5\' 9"  (1.753 m), weight 55.7 kg, SpO2 100 %.  Medical Problem List and Plan: 1. Functional deficits due to secondary progressive multiple sclerosis and debility from prolonged admission on acute floor for encephalopathy             -patient may  shower             -ELOS/Goals: min A to supervision- 22 days  Plan for d/c to SNF  Continue CIR- PT, OT and SLP for swallowing 2.  Impaired mobility: continue Lovenox             -antiplatelet therapy: none  - continue TEDs for BL LE swelling  Dopplers reviewed and are negative for clots.  3. Diffuse pain: not receiving Tylenol regularly, will schedule TID.              -Replaced Naprozen with Mobic.              -Tylenol as needed        will change to Nucynta 75 mg q6 hours - will not add Methadone due to chance of polypharmacy   -discussed that most pain medications aside from the Tylenol and Nucynta could negatively affect cognition.   11/14- con't Nucynta- since change didn't increase dose went from 50 mg q4 to 75 mg q6 hours prn-did decrease Am baclofen to 20 mg- con't other doses at 20 mg and reduced doses of zanaflex-tomorrow, if no other issues, will reduce Valium to 2.5 mg q8 hours    11/16- Has UTI- discussed with pt Oxy vs Nucynta- I will not increase nucynta higher dose - but can change back to Oxycodone 10 mg q4 or 15 mg q6- I don't feel comfortable with 15 mg q4 hours  11/17- will increase tylenol to 1000 mg but max 3x/day- DO NOT increase pain meds over weekend, esp with lower BP lately.   11/20- will decrease ITB pump tomorrow and do trigger point injections of shoulders/upper back/neck for pain control 11/23- TrP injections done 11/21- didn't help pain in head/neck- con't regimen for now  11/27- pt asking to increase Nucynta- if UTI treated/hallucinations improve, then I think it's reasonable to increase Nucynta to 75 mg QID prn 4. Depression: continue Wellbutrin XL 300 mg daily              -amantadine 100 mg TID- per Dr Epimenio Foot 5. Neuropsych/cognition: This patient is capable of making decisions on her own behalf. 6. Skin/Wound Care: Routine skin care checks  11/7- has new DTI on coccyx, worsening now stage II on posterior R thigh and small DTI R heel- added prevalon boots in bed; pt will NOT use air mattress- changing to regular bed due to pt insistence; and foam dressing and turn q2 hours.   11/9- pt doesn't like prevalon boots, but educated on the importance 7. Fluids/Electrolytes/Nutrition: Routine Is and Os and follow-up chemistries             -continue Vitamin C, D3, B12 and fiber supplements 8: MS with spasticity: continue dalfampridine BID and [place]              -Baclofen 20 mg twice daily, 40 mg every morning             -Valium 5 mg every 8 hours             -Zanaflex 4 mg twice daily- were reduced due to cognition/sedation on acute- will change out /refill pump Thursday as well as increase dosing of ITB pump  11/8- spasticity slightly more than normal, however increasing ITB pump tomorrow, so will not increase spasticity PO meds  11/9- increasing ITB pump today- and refilling it.   11/10- pump up to 540 mcg/day  11/14- wait on changing dose- discussed with pt x2 about if we increase ITB PUMP, will help spasticity pain, however will make her functionally weaker. Will d/w pt and husband again as well  11/20- will reduce  dose tomorrow to prior dose.  11/23- reduced dose to 485 mcg and refill date 09/09/22- on IV solumedrol x 5 days- 11/24- finish IV Solumedrol on 11/25   9: GERD: restart Zantac 10: Neurogenic bladder with indwelling Foley; s/p UTI treatment with Zosyn 11: Bowel program: Nutrisource fiber, psyllium caps, Colace, MiraLax, Senekot-S given daily             -placed PRN sorbitol and Fleets orders- has gotten really constipated/needing multiple enemas-   11/9- will increase miralax to BID x 3 days since increasing pump- no BM since admission to rehab  11/7  11/14- LBM x2 on 11/12- if no BM by tomorrow, will do Sorbitol and possibly SSE  11/15- medium-large BM today  11/17- if no BM by tomorrow- needs intervention  11/19 Sorbitol ordered  11/23- LBM yesterday  11/24- LBM last night  11/27- LBM yesterday per pt however last documented 11/23- will give Sorbitol to get her ot go after therapy today 12: Dysphagia: ? resolved; now on regular diet; SLP eval  11/27- see below 13: Seasonal allergies off home meds: monitor 14: Tooth abscess: missed follow-up appointment; completed Flagyl 15: Hypokalemia: on K+ supplement QOD; follow-up BMP tomorrow- likely due to previous Lasix dose for Edema.    11/8- K+ 4.1  11/14- K+ 4.3  Recheck tomorrow  11/27- K+ 4.2 16. Insomnia  11/9- will add Trazodone 50 mg QHS and can titrate up as required  11/10- increase to 150 mg QHS  11/14- will decrease to 100 mg QHS since having some confusion  11/17- having weird dreams but doesn't want to stop Trazodone- explained it's from trazodone.  17. - UTI with VRE: 1x dose of Monural given on 11/28, precautions started  18. Confusion/ progressive weakness- due to high dose IV steroids  11/24- finishing steroids tomorrow- Neuro was OK with her continuing since has more strength in RLE and more movement also in LLE  -also needs head support and lateral supports for w/c- PT will address getting for pt.   11/27- pt reports still confused and having hallucinations- that aren't better off steroids- U/A (+) for leuks, (-) nitrites, rare bacteria, (+) yeast and >50 WBC- doesn't have fever or elevated WBC, however is confused- will try Diflucan 150 mg x1 for yeast and Keflex 500 mg q8 hours for 5 days per ID pharmacist. UC shows enterococcus: Keflex started  -also placed on Seroquel for Confusion by Neurology- will con't  19. Dysphagia  11/27- will order SLP to evaluate Swallowing- they've made her D2 diet- and doing MBS tomorrow to see about liquid thickness.       LOS: 23 days A FACE TO FACE EVALUATION WAS PERFORMED  Sharon Odom 04/26/2022, 10:30 AM

## 2022-04-26 NOTE — Progress Notes (Signed)
Physical Therapy Session Note  Patient Details  Name: Sharon Odom MRN: [id number] Date of Birth: February 05, 1971  Today's Date: 04/26/2022 PT Individual Time: 1000-1015 PT Individual Time Calculation (min): 15 min   Short Term Goals: Week 3:  PT Short Term Goal 1 (Week 3): STG=LTG due ELOS and functional decline  Skilled Therapeutic Interventions/Progress Updates:    pt received in bed and agreeable to therapy. No complaint of pain. Tot A for supine>sit using leg loop for LLE management and assist for trunk elevation. Min-max for sitting balance EOB. Dependent squat pivot transfer to w/c, with pt demoing mild posterior lean and difficulty performing anterior weight shift. Extended time problem solving w/c positioning. Pt positioned with pillows and towel rolls, but continues to required increased head support. Pt requesting to work on leg strength. Pt navigated PWC with intermittent assist for navigation,especially in tight spaces. Pt then performed LAQ x 15 RLE and x 6 with LLE with tapping facilitation and active assist. Performed seated marches in same manner. Pt returned to room and remained in PWC, was left with all needs in reach.   Therapy Documentation Precautions:  Precautions Precautions: Fall Required Braces or Orthoses: Splint/Cast Splint/Cast: L resting hand splint Restrictions Weight Bearing Restrictions: No General:       Therapy/Group: Individual Therapy  Juluis Rainier 04/26/2022, 10:46 AM

## 2022-04-26 NOTE — Progress Notes (Signed)
Palliative:  HPI: 51 y.o. female  with past medical history of relapsing progressive MS, history of baclofen pump infection (November 2022), recent pump replacement (May 2023), thrombocytopenia from [place], anxiety, asthma, and chronic pain admitted on 03/27/2022 from CIR with weakness and fever. Patient is being treated for a UTI with IV Zosyn. MRI of the brain revealed new demyelinated lesions and Solu-Medrol was started.  Neurology was consulted.  Sedating medications were titrated down.  Patient transitioned to inpatient rehab for treatment. PMT was consulted to discuss goals of care.      I met today with [place] and her sister-in-law, Colletta [place], at bedside. Ameria is feeling very tired and fatigued today. She is struggling with maintaining her head and shoulder on chair and tilting to her right - this seems a little worse today. Colletta [place] has travelled from up [place] to help Shanon Brow look at facilities and try and coordinate a plan for [place]. Andreina asks questions about facilities and it is important to her that the rehab section of a facility is separated from long term care (she does not want the smell from long term care). Colletta Maryland has also been in contact with private caregiver agencies to try and have a secondary plan for home care. Ideally Iasia would like to go home but the resources for private care at home is not quite there. I assisted with brain storming and they are asking friends and family to assist with search as well. There has even been discussion regarding family rotating down from [place] to help care for [place] at home if needed but there are limitations to this plan as well. Husband, Shanon Brow, joins [place] at bedside. I spoke with them also about goals of care and andy other questions or concerns from a palliative aspect and they all deny any concerns. Emotional support provided.   Goals remain clear at this time. Disposition is the greatest issue. If [place] does return home I do feel she  would be eligible for hospice care to supplement at home due to her progression and her goals of care (they are aware of this option). Hospice would provide much more support than palliative care in the home.   Exam: Alert. Oriented. Generalized weakness. Breathing regular, unlabored. Voice weak. Abd soft. Generalized weakness and fatigue. Significant head tilt to right.   Plan: - MOST form and completed: DNR, comfort measures, determine use or limitations of antibiotics when infection occurs (she would be okay with antibiotic pill for UTI but not for IV antibiotics with florid pneumonia), she would consider IV fluids for defined trial period, no feeding tube. - Open to SNF rehab. Open to palliative vs hospice options for additional support.  - Regular diet with acceptance of risks.  - Goals clear. Disposition issue.   [place], NP Palliative Medicine Team Pager [phone] (Please see [email or web address] for schedule) Team Phone [phone]    Greater than 50%  of this time was spent counseling and coordinating care related to the above assessment and plan

## 2022-04-26 NOTE — Progress Notes (Signed)
Speech Language Pathology Daily Session Note  Patient Details  Name: Sharon Odom MRN: [id number] Date of Birth: [birth year]  Today's Date: 04/26/2022 SLP Individual Time: 0850-0920 SLP Individual Time Calculation (min): 30 min  Short Term Goals: Week 4: SLP Short Term Goal 1 (Week 4): STG=LTG due to awaiting SNF  Skilled Therapeutic Interventions: Skilled ST treatment focused on cognitive goals and f/u eduction on dysphagia. Pt was completing oral care on arrival and required max A for set-up/clean up. Pt with amplifier in place however only perceived as ~25-50% intelligible at the sentence level, ~75% at the word level. SLP offering repositioning throughout d/t heavy R lean. Pt independently requested for SLP to write in memory notebook. Required sup A cues for identifying date due to inaccurate date written on dry erase board in room. Pt requested for HOB to be lowered. After this was facilitated, pt went to reach for fruit cup on bedside table. SLP provided reinforcement of swallowing precautions including optimal positioning for intake in order to minimize aspiration and choking risk. Pt verbalized understanding and requested for HOB to be elevated so she could consume fruit cup as snack and sips of water which were within reach on bedside table, although pt did not consume while SLP was present. Patient was left in bed with alarm activated and immediate needs within reach at end of session. Continue per current plan of care.      Pain Pain Assessment Pain Scale: 0-10 Pain Score: 8  Pain Type: Chronic pain Pain Location: Generalized Pain Orientation: Left;Right Pain Descriptors / Indicators: Aching Pain Frequency: Constant Pain Onset: On-going Patients Stated Pain Goal: 4 Pain Intervention(s): Repositioned  Therapy/Group: Individual Therapy  Tamala Ser 04/26/2022, 9:27 AM

## 2022-04-26 NOTE — Progress Notes (Signed)
Patient refused to be turned and repositioned at this time. Patient educated on risk/benefit.

## 2022-04-27 MED ORDER — HYDROCODONE-ACETAMINOPHEN 5-325 MG PO TABS
1.0000 | ORAL_TABLET | Freq: Four times a day (QID) | ORAL | Status: DC | PRN
  Administered 2022-04-27 – 2022-04-30 (×6): 1 via ORAL
  Filled 2022-04-27 (×7): qty 1

## 2022-04-27 NOTE — Progress Notes (Addendum)
Patient ID: Sharon Odom, female   DOB: [birth year], 51 y.o.   MRN: [id number]  Spoke with Dave-husband to update him regarding no offer from [facility] and did contact [employer] and they do not accept her insurance. Theodoro Grist reports his sister did visit [facility] and they will accept this one. Have reached out to [facility] to start auth and see if can get insurance to approve. Theodoro Grist also plans to come in Sunday for education with PT from 10-12 in case insurance denies her going to a SNF. Continue to work on best plan for pt.  1;13 PM Spoke with cathy-admissions at [facility] they will begin the insurance auth.

## 2022-04-27 NOTE — Progress Notes (Signed)
Speech Language Pathology Daily Session Note  Patient Details  Name: Sharon Odom MRN: [id number] Date of Birth: 1970/07/08  Today's Date: 04/27/2022 SLP Individual Time: 5056-9794 SLP Individual Time Calculation (min): 45 min  Short Term Goals: Week 4: SLP Short Term Goal 1 (Week 4): STG=LTG due to awaiting SNF  Skilled Therapeutic Interventions: Skilled ST treatment focused on cognitive and speech goals. Upon arrival, pt was in wheelchair with head leaning significantly to the right. Pt exhibited difficulty supporting her head/neck even with neck pillow in place. Speech amplification was also in place however pt exhibited diminished speech intelligibility and was perceived as ~25% at the word level with significantly reduced vocal intensity and articulatory precision. Pt with limited use of compensatory techniques due to lethargy and generalized weakness. Attempts to increase breath support for speech and over articulation were unfortunately minimally effective requiring max fading to mod A cues. SLP facilitated a word generating activity with focus on speech, attention, and verbal reasoning/problem solving goals. Pt completed task with sup A verbal cues and extended time for verbal reasoning. Pt independently requested SLP to write in memory notebook prior to end of session. Pt limited by fatigue today. Patient was left in chair with alarm activated and immediate needs within reach at end of session. Continue per current plan of care.      Pain  Denied  Therapy/Group: Individual Therapy  Tamala Ser 04/27/2022, 12:38 PM

## 2022-04-27 NOTE — Plan of Care (Signed)
Goal downgraded due to decline in function   Problem: RH Expression Communication Goal: LTG Patient will increase speech intelligibility (SLP) Description: LTG: Patient will increase speech intelligibility at word/phrase/conversation level with cues, % of the time (SLP) Flowsheets (Taken 04/27/2022 1701) LTG: Patient will increase speech intelligibility (SLP): Moderate Assistance - Patient 50 - 74%

## 2022-04-27 NOTE — Progress Notes (Signed)
Palliative:  I went to see Sharon Odom but she was not in her room at time of my visit. I touched base with husband Sharon Odom via telephone. No questions or concerns at this time. I will return to service Monday 04/30/22.   No charge  Yong Channel, NP Palliative Medicine Team Pager [phone] (Please see [email or web address] for schedule) Team Phone [phone]

## 2022-04-27 NOTE — Progress Notes (Signed)
Patient continues to be repositioned every 2 hours. Requests PRN pain medications every 6 hours, states her pain starts back before the 6 hours. Dr. Carlis Abbott notified of increased pain. PRN Norco ordered. Patient notified. Resting in bed at this time.

## 2022-04-27 NOTE — Progress Notes (Signed)
Physical Therapy Session Note  Patient Details  Name: Sharon Odom MRN: [id number] Date of Birth: [birth year]  Today's Date: 04/27/2022 PT Individual Time: 1125-1215 PT Individual Time Calculation (min): 50 min   Short Term Goals: Week 3:  PT Short Term Goal 1 (Week 3): STG=LTG due ELOS and functional decline  Skilled Therapeutic Interventions/Progress Updates: Pt presented in [place] agreeable to therapy. Pt initially indicating need for possible BM however after extended time pt expressing no longer a need but was ?explaining a situation from last night. Pt agreeable to work on ROM and LE therex. Pt able to navigate [place] with increased time to day room. With RLE pt able to perform LAQ with 1.5lb cuff, AA hip flexion, manually resisted leg press, and AA DF (to incorporate HC stretch). Pt able to perform small range LAQ on LLE and AA ROM on remaining directions. Pt navigated back to room however at end required minA to avoid lunch cart. Pt set up in room at end of session for lunch and left in [place] with nsg present and needs met.      Therapy Documentation Precautions:  Precautions Precautions: Fall Required Braces or Orthoses: Splint/Cast Splint/Cast: L resting hand splint Restrictions Weight Bearing Restrictions: No General:   Vital Signs:   Pain:   Mobility:   Locomotion :    Trunk/Postural Assessment :    Balance:   Exercises:   Other Treatments:      Therapy/Group: Individual Therapy  Sharon Odom 04/27/2022, 1:28 PM

## 2022-04-27 NOTE — Progress Notes (Signed)
Occupational Therapy Weekly Progress Note  Patient Details  Name: Sharon Odom MRN: [id number] Date of Birth: 1970/11/16  Beginning of progress report period: April 20, 2022 End of progress report period: Apr 27, 2022  Today's Date: 04/27/2022 OT Individual Time: 1st Session ([id number]) [id number], 2nd Session 1300-1415 OT Individual Time Calculation (min): 60 min, 75 min    Patient has met 0 of 5 short term goals.  STG=LTG d/t ELOS with LT's modified to reflect current status and change in dispo   Patient continues to demonstrate the following deficits: muscle weakness, muscle joint tightness, and muscle paralysis, decreased cardiorespiratoy endurance, impaired timing and sequencing, abnormal tone, unbalanced muscle activation, and decreased coordination, blurred vision, intermittent diplopia, decreased memory and delayed processing, and decreased sitting balance, decreased standing balance, decreased postural control, hemiplegia, and decreased balance strategies and therefore will continue to benefit from skilled OT intervention to enhance overall performance with BADL, iADL, and Reduce care partner burden.  Patient progressing toward long term goals following LTG downgrade last week. Pt now with VRE infection and on abx once again. Pt's medical status has impacted functional performance and now disposition, Continue plan of care as family is deciding now on SNF vs home with 24 hr hired assistance and [place].   OT Short Term Goals Week 5: STG=LTG d/t ELOS with LT's modified to reflect current status and change in dispo   Skilled Therapeutic Interventions/Progress Updates:   1st Session:  Pt seen for am self care session with reassessment for weekly progress and progression of BADL's. Pt completing oral care and sponge bathing with nursing upon OT arrival. OT focus on LB self care with AE bed level with max A with increased time, OOB to [place] with TA as 2nd assist due to safety with max a modified squat  pivot transfer after max A supine to sit and EOB sitting. Improved overall UB dressing with cami doffing with mod A, donning pullover camil and sweater with mod A with cues, support for head and trunk and increased time. OT contacted [facility] and he reported coming to CIR on Mon or Tues to adjust head rest options. Left pt with NT to set up call button and safety belt.   2nd Session:  OT arrived and pt completing finger foods and beverage self feeding with nursing. OT facilitated upright head with neck rolls and modification. Pt to complete desert and milk with increased time and effort with R UE and set up. Pt asked to leave unit to access gift shop in [facility]. OT facilitated safety, obstacle negotiation and visual scanning as well as control access and coordination. Mod cues and occasion min A to navigate on indoor moderate speed with increased time to arrive and return due to slower procesisng and motor output even on stronger side R sided controls. Pt requested OT to reach for sweater pt liked and pt able to self transport and place CC cashier interface and pull to sit upright to reach signature pad with S only. Pt left in the hands of NT upon arrival to unit and NT Cusharda agreed to place pt's belongings in her purse inside room.   Therapy Documentation Precautions:  Precautions Precautions: Fall Required Braces or Orthoses: Splint/Cast Splint/Cast: L resting hand splint Restrictions Weight Bearing Restrictions: No    Therapy/Group: Individual Therapy  Barnabas Lister 04/27/2022, 6:34 PM

## 2022-04-27 NOTE — Progress Notes (Addendum)
PROGRESS NOTE   Subjective/Complaints: No new complaints this morning She continues to have neck tightness and diffuse pain- discussed alternative pain medications but she is afraid of their interactions with current medications   ROS:  Pt denies SOB, abd pain, CP, N/V/C/D, and vision changes, +diffuse pain, +cervical dystonia, +swelling in feet and hands  Objective:   No results found. No results for input(s): "WBC", "HGB", "HCT", "PLT" in the last 72 hours.    No results for input(s): "NA", "K", "CL", "CO2", "GLUCOSE", "BUN", "CREATININE", "CALCIUM" in the last 72 hours.     Intake/Output Summary (Last 24 hours) at 04/27/2022 1558 Last data filed at 04/27/2022 0900 Gross per 24 hour  Intake 470 ml  Output 600 ml  Net -130 ml     Pressure Injury 03/31/22 Sacrum Medial Deep Tissue Pressure Injury - Purple or maroon localized area of discolored intact skin or blood-filled blister due to damage of underlying soft tissue from pressure and/or shear. (Active)  03/31/22 0815  Location: Sacrum  Location Orientation: Medial  Staging: Deep Tissue Pressure Injury - Purple or maroon localized area of discolored intact skin or blood-filled blister due to damage of underlying soft tissue from pressure and/or shear.  Wound Description (Comments):   Present on Admission: Yes     Pressure Injury 04/03/22 Thigh Left;Posterior;Proximal Stage 2 -  Partial thickness loss of dermis presenting as a shallow open injury with a red, pink wound bed without slough. red pink (Active)  04/03/22 1751  Location: Thigh  Location Orientation: Left;Posterior;Proximal  Staging: Stage 2 -  Partial thickness loss of dermis presenting as a shallow open injury with a red, pink wound bed without slough.  Wound Description (Comments): red pink  Present on Admission: Yes    Physical Exam: Vital Signs Blood pressure 103/63, pulse 72, temperature 98.2 F  (36.8 C), resp. rate 18, height 5\' 9"  (1.753 m), weight 56.2 kg, SpO2 100 %.   General: awake, alert, appropriate, sitting up in w/c- head all the way to R- needs better head support on w/c; NAD HENT: conjugate gaze; oropharynx moist, +cervical dystonia with head tilt to right, tight cervical myofascia CV: regular rate; no JVD Pulmonary: CTA B/L; no W/R/R- good air movement GI: soft, NT, ND, (+)BS- ITB pump in place on R side Psychiatric: appropriate Neurological: Ox3- but still extremely dysarthric and almost aphonic- is worse than originally, but better than 2 days ago  LLE- back to baseline  Skin:    General: Skin is warm and dry.     Comments: New DTI on coccyx  Also worsening skin open/pressure Stage II on posterior thigh- on R thigh Also small DTI on R heel  Neurological:     Mental Status: She is alert.     Comments: MAS 2 to 3 RLE, MAS 1+ LLE  Motor 5/5 RUE 3+ LUE RLE- HF/KE/DF and PF 4-/5- was 2/5 LLE- HF 1 to 2-/5; otherwise 2+/5 which is better than 1/5  MS: Head lolling somewhat to right, but able to bring back to midline without assistance  Assessment/Plan: 1. Functional deficits which require 3+ hours per day of interdisciplinary therapy in a comprehensive inpatient rehab setting. Physiatrist  is providing close team supervision and 24 hour management of active medical problems listed below. Physiatrist and rehab team continue to assess barriers to discharge/monitor patient progress toward functional and medical goals  Care Tool:  Bathing    Body parts bathed by patient: Right arm, Left arm, Chest, Abdomen, Face   Body parts bathed by helper: Right lower leg, Left lower leg, Front perineal area, Buttocks, Right upper leg, Left upper leg     Bathing assist Assist Level: Maximal Assistance - Patient 24 - 49%     Upper Body Dressing/Undressing Upper body dressing   What is the patient wearing?: Pull over shirt    Upper body assist Assist Level: Moderate  Assistance - Patient 50 - 74%    Lower Body Dressing/Undressing Lower body dressing      What is the patient wearing?: Underwear/pull up, Pants     Lower body assist Assist for lower body dressing: Maximal Assistance - Patient 25 - 49%     Toileting Toileting Toileting Activity did not occur (Clothing management and hygiene only): N/A (no void or bm)  Toileting assist Assist for toileting: Moderate Assistance - Patient 50 - 74%     Transfers Chair/bed transfer  Transfers assist     Chair/bed transfer assist level: Maximal Assistance - Patient 25 - 49%     Locomotion Ambulation   Ambulation assist   Ambulation activity did not occur: Safety/medical concerns (unable to perform due to pain, weakness and decreased activity tolerance)          Walk 10 feet activity   Assist  Walk 10 feet activity did not occur: Safety/medical concerns (unable to perform due to pain, weakness and decreased activity tolerance)        Walk 50 feet activity   Assist Walk 50 feet with 2 turns activity did not occur: Safety/medical concerns (unable to perform due to pain, weakness and decreased activity tolerance)         Walk 150 feet activity   Assist Walk 150 feet activity did not occur: Safety/medical concerns (unable to perform due to pain, weakness and decreased activity tolerance)         Walk 10 feet on uneven surface  activity   Assist Walk 10 feet on uneven surfaces activity did not occur: Safety/medical concerns (unable to perform due to pain, weakness and decreased activity tolerance)         Wheelchair     Assist Is the patient using a wheelchair?: Yes Type of Wheelchair: Power    Wheelchair assist level: Supervision/Verbal cueing Max wheelchair distance: 300 ft    Wheelchair 50 feet with 2 turns activity    Assist        Assist Level: Supervision/Verbal cueing   Wheelchair 150 feet activity     Assist      Assist Level:  Supervision/Verbal cueing   Blood pressure 103/63, pulse 72, temperature 98.2 F (36.8 C), resp. rate 18, height 5\' 9"  (1.753 m), weight 56.2 kg, SpO2 100 %.  Medical Problem List and Plan: 1. Functional deficits due to secondary progressive multiple sclerosis and debility from prolonged admission on acute floor for encephalopathy             -patient may  shower             -ELOS/Goals: min A to supervision- 22 days  Plan for d/c to SNF   Continue CIR- PT, OT and SLP for swallowing 2.  Impaired mobility: continue Lovenox             -  antiplatelet therapy: none  - continue TEDs for BL LE swelling  Dopplers reviewed and are negative for clots.  3. Diffuse pain: not receiving Tylenol regularly, will schedule TID. Discussed alternative pain medications but she is afraid about how these could interact with her current medications. Discussed with her husband and she and he would like for her to restart Norco that she took before: started 1 tab q6H prn             -Replaced Naproxen with Mobic.              -Tylenol as needed        will change to Nucynta 75 mg q6 hours - will not add Methadone due to chance of polypharmacy   -discussed that most pain medications aside from the Tylenol and Nucynta could negatively affect cognition.   11/14- con't Nucynta- since change didn't increase dose went from 50 mg q4 to 75 mg q6 hours prn-did decrease Am baclofen to 20 mg- con't other doses at 20 mg and reduced doses of zanaflex-tomorrow, if no other issues, will reduce Valium to 2.5 mg q8 hours    11/16- Has UTI- discussed with pt Oxy vs Nucynta- I will not increase nucynta higher dose - but can change back to Oxycodone 10 mg q4 or 15 mg q6- I don't feel comfortable with 15 mg q4 hours  11/17- will increase tylenol to 1000 mg but max 3x/day- DO NOT increase pain meds over weekend, esp with lower BP lately.   11/20- will decrease ITB pump tomorrow and do trigger point injections of shoulders/upper back/neck  for pain control 11/23- TrP injections done 11/21- didn't help pain in head/neck- con't regimen for now  11/27- pt asking to increase Nucynta- if UTI treated/hallucinations improve, then I think it's reasonable to increase Nucynta to 75 mg QID prn 4. Depression: continue Wellbutrin XL 300 mg daily             -amantadine 100 mg TID- per Dr Epimenio Foot 5. Neuropsych/cognition: This patient is capable of making decisions on her own behalf. 6. Skin/Wound Care: Routine skin care checks  11/7- has new DTI on coccyx, worsening now stage II on posterior R thigh and small DTI R heel- added prevalon boots in bed; pt will NOT use air mattress- changing to regular bed due to pt insistence; and foam dressing and turn q2 hours.   11/9- pt doesn't like prevalon boots, but educated on the importance 7. Fluids/Electrolytes/Nutrition: Routine Is and Os and follow-up chemistries             -continue Vitamin C, D3, B12 and fiber supplements 8: MS with spasticity: continue dalfampridine BID and [place]              -Baclofen 20 mg twice daily, 40 mg every morning             -Valium 5 mg every 8 hours             -Zanaflex 4 mg twice daily- were reduced due to cognition/sedation on acute- will change out /refill pump Thursday as well as increase dosing of ITB pump  11/8- spasticity slightly more than normal, however increasing ITB pump tomorrow, so will not increase spasticity PO meds  11/9- increasing ITB pump today- and refilling it.   11/10- pump up to 540 mcg/day  11/14- wait on changing dose- discussed with pt x2 about if we increase ITB PUMP, will help spasticity pain, however will make her  functionally weaker. Will d/w pt and husband again as well  11/20- will reduce dose tomorrow to prior dose.  11/23- reduced dose to 485 mcg and refill date 09/09/22- on IV solumedrol x 5 days- 11/24- finish IV Solumedrol on 11/25   9: GERD: restart Zantac 10: Neurogenic bladder with indwelling Foley; s/p UTI treatment with  Zosyn 11: Bowel program: Nutrisource fiber, psyllium caps, Colace, MiraLax, Senekot-S given daily             -placed PRN sorbitol and Fleets orders- has gotten really constipated/needing multiple enemas-   11/9- will increase miralax to BID x 3 days since increasing pump- no BM since admission to rehab 11/7  11/14- LBM x2 on 11/12- if no BM by tomorrow, will do Sorbitol and possibly SSE  11/15- medium-large BM today  11/17- if no BM by tomorrow- needs intervention  11/19 Sorbitol ordered  11/23- LBM yesterday  11/24- LBM last night  11/27- LBM yesterday per pt however last documented 11/23- will give Sorbitol to get her ot go after therapy today 12: Dysphagia: ? resolved; now on regular diet; SLP eval  11/27- see below 13: Seasonal allergies off home meds: monitor 14: Tooth abscess: missed follow-up appointment; completed Flagyl 15: Hypokalemia: on K+ supplement QOD; follow-up BMP tomorrow- likely due to previous Lasix dose for Edema.    11/8- K+ 4.1  11/14- K+ 4.3  Recheck tomorrow  11/27- K+ 4.2 16. Insomnia  11/9- will add Trazodone 50 mg QHS and can titrate up as required  11/10- increase to 150 mg QHS  11/14- will decrease to 100 mg QHS since having some confusion  11/17- having weird dreams but doesn't want to stop Trazodone- explained it's from trazodone.  17. - UTI with VRE: 1x dose of Monural given on 11/28, precautions started  18. Confusion/ progressive weakness- due to high dose IV steroids  11/24- finishing steroids tomorrow- Neuro was OK with her continuing since has more strength in RLE and more movement also in LLE  -also needs head support and lateral supports for w/c- PT will address getting for pt.   11/27- pt reports still confused and having hallucinations- that aren't better off steroids- U/A (+) for leuks, (-) nitrites, rare bacteria, (+) yeast and >50 WBC- doesn't have fever or elevated WBC, however is confused- will try Diflucan 150 mg x1 for yeast and Keflex  500 mg q8 hours for 5 days per ID pharmacist. UC shows enterococcus: Keflex started  -also placed on Seroquel for Confusion by Neurology- will con't  19. Dysphagia  11/27- will order SLP to evaluate Swallowing- they've made her D2 diet- and doing MBS tomorrow to see about liquid thickness.  20. Mild swelling of multiple extremities: vascular ultrasound reviewed and negative.      LOS: 24 days A FACE TO FACE EVALUATION WAS PERFORMED  Drema Pry Ramanda Paules 04/27/2022, 3:58 PM

## 2022-04-28 DIAGNOSIS — G479 Sleep disorder, unspecified: Secondary | ICD-10-CM

## 2022-04-28 NOTE — Progress Notes (Signed)
PROGRESS NOTE   Subjective/Complaints: Pt with ongoing pain in neck. She sits up in bed with head rotated to the right. Pain meds not lasting long enough but pt has been hesitant to try new meds. Norco added last night with some benefit  ROS: Limited due to cognitive/behavioral   Objective:   No results found. No results for input(s): "WBC", "HGB", "HCT", "PLT" in the last 72 hours.    No results for input(s): "NA", "K", "CL", "CO2", "GLUCOSE", "BUN", "CREATININE", "CALCIUM" in the last 72 hours.     Intake/Output Summary (Last 24 hours) at 04/28/2022 1111 Last data filed at 04/28/2022 0858 Gross per 24 hour  Intake 521 ml  Output 950 ml  Net -429 ml     Pressure Injury 03/31/22 Sacrum Medial Deep Tissue Pressure Injury - Purple or maroon localized area of discolored intact skin or blood-filled blister due to damage of underlying soft tissue from pressure and/or shear. (Active)  03/31/22 0815  Location: Sacrum  Location Orientation: Medial  Staging: Deep Tissue Pressure Injury - Purple or maroon localized area of discolored intact skin or blood-filled blister due to damage of underlying soft tissue from pressure and/or shear.  Wound Description (Comments):   Present on Admission: Yes     Pressure Injury 04/03/22 Thigh Left;Posterior;Proximal Stage 2 -  Partial thickness loss of dermis presenting as a shallow open injury with a red, pink wound bed without slough. red pink (Active)  04/03/22 1751  Location: Thigh  Location Orientation: Left;Posterior;Proximal  Staging: Stage 2 -  Partial thickness loss of dermis presenting as a shallow open injury with a red, pink wound bed without slough.  Wound Description (Comments): red pink  Present on Admission: Yes    Physical Exam: Vital Signs Blood pressure (!) 90/50, pulse 64, temperature 98.3 F (36.8 C), resp. rate 18, height 5\' 9"  (1.753 m), weight 55.2 kg, SpO2 99  %.   General: awake, alert, appropriate, sitting up in w/c- head all the way to R- needs better head support on w/c; NAD Constitutional: No distress . Vital signs reviewed. HEENT: NCAT, EOMI, oral membranes moist Neck: supple Cardiovascular: RRR without murmur. No JVD    Respiratory/Chest: CTA Bilaterally without wheezes or rales. Normal effort    GI/Abdomen: BS +, non-tender, non-distended, bac pump in abd Ext: no clubbing, cyanosis, or edema Psych: pleasant and cooperative  Neurological: Ox3- but still extremely dysarthric and almost aphonic- is worse than originally, but better than 2 days ago. Very difficult to understand LLE- back to baseline  Skin:    General: Skin is warm and dry.     Comments: New DTI on coccyx  Also worsening skin open/pressure Stage II on posterior thigh- on R thigh Also small DTI on R heel  Neurological:     Mental Status: She is alert.     Comments: MAS 2 to 3 RLE, MAS 1+ LLE  Motor 5/5 RUE 3+ LUE RLE- HF/KE/DF and PF 4-/5- was 2/5 LLE- HF 1 to 2-/5; otherwise 2+/5 which is better than 1/5  MS: Head lolling somewhat to right, but able to bring back somewhat to midline without assistance. Right SCM tight  Assessment/Plan: 1. Functional  deficits which require 3+ hours per day of interdisciplinary therapy in a comprehensive inpatient rehab setting. Physiatrist is providing close team supervision and 24 hour management of active medical problems listed below. Physiatrist and rehab team continue to assess barriers to discharge/monitor patient progress toward functional and medical goals  Care Tool:  Bathing    Body parts bathed by patient: Right arm, Left arm, Chest, Abdomen, Face   Body parts bathed by helper: Right lower leg, Left lower leg, Front perineal area, Buttocks, Right upper leg, Left upper leg     Bathing assist Assist Level: Maximal Assistance - Patient 24 - 49%     Upper Body Dressing/Undressing Upper body dressing   What is the  patient wearing?: Pull over shirt    Upper body assist Assist Level: Moderate Assistance - Patient 50 - 74%    Lower Body Dressing/Undressing Lower body dressing      What is the patient wearing?: Underwear/pull up, Pants     Lower body assist Assist for lower body dressing: Maximal Assistance - Patient 25 - 49%     Toileting Toileting Toileting Activity did not occur (Clothing management and hygiene only): N/A (no void or bm)  Toileting assist Assist for toileting: Moderate Assistance - Patient 50 - 74%     Transfers Chair/bed transfer  Transfers assist     Chair/bed transfer assist level: Maximal Assistance - Patient 25 - 49%     Locomotion Ambulation   Ambulation assist   Ambulation activity did not occur: Safety/medical concerns (unable to perform due to pain, weakness and decreased activity tolerance)          Walk 10 feet activity   Assist  Walk 10 feet activity did not occur: Safety/medical concerns (unable to perform due to pain, weakness and decreased activity tolerance)        Walk 50 feet activity   Assist Walk 50 feet with 2 turns activity did not occur: Safety/medical concerns (unable to perform due to pain, weakness and decreased activity tolerance)         Walk 150 feet activity   Assist Walk 150 feet activity did not occur: Safety/medical concerns (unable to perform due to pain, weakness and decreased activity tolerance)         Walk 10 feet on uneven surface  activity   Assist Walk 10 feet on uneven surfaces activity did not occur: Safety/medical concerns (unable to perform due to pain, weakness and decreased activity tolerance)         Wheelchair     Assist Is the patient using a wheelchair?: Yes Type of Wheelchair: Power    Wheelchair assist level: Supervision/Verbal cueing Max wheelchair distance: 300 ft    Wheelchair 50 feet with 2 turns activity    Assist        Assist Level: Supervision/Verbal  cueing   Wheelchair 150 feet activity     Assist      Assist Level: Supervision/Verbal cueing   Blood pressure (!) 90/50, pulse 64, temperature 98.3 F (36.8 C), resp. rate 18, height 5\' 9"  (1.753 m), weight 55.2 kg, SpO2 99 %.  Medical Problem List and Plan: 1. Functional deficits due to secondary progressive multiple sclerosis and debility from prolonged admission on acute floor for encephalopathy             -patient may  shower             -ELOS/Goals: min A to supervision- 22 days  Plan for d/c to  SNF   -Continue CIR therapies including PT, OT, and SLP  2.  Impaired mobility: continue Lovenox             -antiplatelet therapy: none  - continue TEDs for BL LE swelling  Dopplers reviewed and are negative for clots.  3. Diffuse pain: not receiving Tylenol regularly, will schedule TID. Discussed alternative pain medications but she is afraid about how these could interact with her current medications. Discussed with her husband and she and he would like for her to restart Norco that she took before: started 1 tab q6H prn             -Replaced Naproxen with Mobic.              -Tylenol as needed        will change to Nucynta 75 mg q6 hours - will not add Methadone due to chance of polypharmacy   -discussed that most pain medications aside from the Tylenol and Nucynta could negatively affect cognition.   11/14- con't Nucynta- since change didn't increase dose went from 50 mg q4 to 75 mg q6 hours prn-did decrease Am baclofen to 20 mg- con't other doses at 20 mg and reduced doses of zanaflex-tomorrow, if no other issues, will reduce Valium to 2.5 mg q8 hours    11/16- Has UTI- discussed with pt Oxy vs Nucynta- I will not increase nucynta higher dose - but can change back to Oxycodone 10 mg q4 or 15 mg q6- I don't feel comfortable with 15 mg q4 hours  11/17- will increase tylenol to 1000 mg but max 3x/day- DO NOT increase pain meds over weekend, esp with lower BP lately.   11/20- will  decrease ITB pump tomorrow and do trigger point injections of shoulders/upper back/neck for pain control 11/23- TrP injections done 11/21- didn't help pain in head/neck- con't regimen for now  11/27- pt asking to increase Nucynta- if UTI treated/hallucinations improve, then I think it's reasonable to increase Nucynta to 75 mg QID prn  12/2 norco added by Dr. Carlis Abbott last night--observe today   -might be better served with long acting agent   -reviewed posture and neutral head positioning today with pt 4. Depression: continue Wellbutrin XL 300 mg daily             -amantadine 100 mg TID- per Dr Epimenio Foot 5. Neuropsych/cognition: This patient is capable of making decisions on her own behalf. 6. Skin/Wound Care: Routine skin care checks  11/7- has new DTI on coccyx, worsening now stage II on posterior R thigh and small DTI R heel- added prevalon boots in bed; pt will NOT use air mattress- changing to regular bed due to pt insistence; and foam dressing and turn q2 hours.   11/9- pt doesn't like prevalon boots, but educated on the importance 7. Fluids/Electrolytes/Nutrition: Routine Is and Os and follow-up chemistries             -continue Vitamin C, D3, B12 and fiber supplements 8: MS with spasticity: continue dalfampridine BID and [place]              -Baclofen 20 mg twice daily, 40 mg every morning             -Valium 5 mg every 8 hours             -Zanaflex 4 mg twice daily- were reduced due to cognition/sedation on acute- will change out /refill pump Thursday as well as increase dosing of ITB  pump  11/8- spasticity slightly more than normal, however increasing ITB pump tomorrow, so will not increase spasticity PO meds  11/9- increasing ITB pump today- and refilling it.   11/10- pump up to 540 mcg/day  11/14- wait on changing dose- discussed with pt x2 about if we increase ITB PUMP, will help spasticity pain, however will make her functionally weaker. Will d/w pt and husband again as well  11/20-  will reduce dose tomorrow to prior dose.  11/23- reduced dose to 485 mcg and refill date 09/09/22- on IV solumedrol x 5 days- 11/24- finish IV Solumedrol on 11/25   9: GERD: restart Zantac 10: Neurogenic bladder with indwelling Foley; s/p UTI treatment with Zosyn 11: Bowel program: Nutrisource fiber, psyllium caps, Colace, MiraLax, Senekot-S given daily             -placed PRN sorbitol and Fleets orders- has gotten really constipated/needing multiple enemas-   11/9- will increase miralax to BID x 3 days since increasing pump- no BM since admission to rehab 11/7  11/14- LBM x2 on 11/12- if no BM by tomorrow, will do Sorbitol and possibly SSE  11/15- medium-large BM today  11/17- if no BM by tomorrow- needs intervention  11/19 Sorbitol ordered  11/23- LBM yesterday  11/24- LBM last night  11/27- LBM yesterday per pt however last documented 11/23- will give Sorbitol to get her ot go after therapy today  12/2 +BM 12/1 12: Dysphagia: ? resolved; now on regular diet; SLP eval  11/27- see below 13: Seasonal allergies off home meds: monitor 14: Tooth abscess: missed follow-up appointment; completed Flagyl 15: Hypokalemia: on K+ supplement QOD; follow-up BMP tomorrow- likely due to previous Lasix dose for Edema.    11/8- K+ 4.1  11/14- K+ 4.3  Recheck tomorrow  11/27- K+ 4.2 16. Insomnia  11/9- will add Trazodone 50 mg QHS and can titrate up as required  11/10- increase to 150 mg QHS  11/14- will decrease to 100 mg QHS since having some confusion  11/17- having weird dreams but doesn't want to stop Trazodone- explained it's from trazodone.  17. - UTI with VRE: 1x dose of Monural given on 11/28, precautions started  18. Confusion/ progressive weakness- due to high dose IV steroids  12/2 some improvement after steroids  -polypharmacy also contributing 19. Dysphagia  11/27- will order SLP to evaluate Swallowing- they've made her D2 diet- and doing MBS tomorrow to see about liquid thickness.   20. Mild swelling of multiple extremities: vascular ultrasound reviewed and negative.      LOS: 25 days A FACE TO FACE EVALUATION WAS PERFORMED  Ranelle Oyster 04/28/2022, 11:11 AM

## 2022-04-29 NOTE — Progress Notes (Signed)
Occupational Therapy Session Note  Patient Details  Name: Artie Takayama MRN: [id number] Date of Birth: Feb 08, 1971  Today's Date: 04/29/2022 OT Individual Time:1030-1115, 45 min    Short Term Goals: Week 4: STG=LTG's   Skilled Therapeutic Interventions/Progress Updates:   Pt seen for skilled OT session with focus on Family Education with husband. Pt in bed after completing am meal with NT as well as simple sponge bathing. OT able to focus on posture, motor control and coordination, AE, task simplification and joint protection as well as skin integrity and positioning during full dressing routine. Nurse arrived to administer meds and OT retrieved [facility] lift and initiated training in case pt does discharge to home. Husband Onalee Hua open to all training. OT instructed in sling use/placement, parts and operation of device and deferred transfer to PT as OT completed then strategies for UB and LB dressing and Foley catheter mngt for clothing. Husband to purchase a new claw style reacher for dressing and suction reacher for item retrieval. Noted increased R and L UE strength this session however continues to require mod-max a for LB dressing and mod-min A for UB pull over garments all with increased time. OT rec hospital bed and hoyer lift if d/c'd to home with 24 hr care vs SNF.  Left pt in the hand off to PT at end of session.   Therapy Documentation Precautions:  Precautions Precautions: Fall Required Braces or Orthoses: Splint/Cast Splint/Cast: L resting hand splint Restrictions Weight Bearing Restrictions: No    Therapy/Group: Individual Therapy  Vicenta Dunning 04/29/2022, 6:08 PM

## 2022-04-29 NOTE — Progress Notes (Addendum)
Speech Language Pathology Daily Session Note  Patient Details  Name: Sharon Odom MRN: [id number] Date of Birth: 08-Dec-1970  Today's Date: 04/29/2022 SLP Individual Time: 1000-1030 SLP Individual Time Calculation (min): 30 min  Short Term Goals: Week 4: SLP Short Term Goal 1 (Week 4): STG=LTG due to awaiting SNF  Skilled Therapeutic Interventions:  Pt was seen for skilled ST targeting family education.  Pt with husband at bedside.  Pt presented with questions regarding ways to make eating easier in light of her decision to remain on regular textures/thin liquids despite significant dysphagia noted on most recent imaging.  She specifically reported difficulty with dry breads and meats but she independently indicated 1-2 strategies that she is already using to mitigate symptoms (ie dunking breads in liquids and adding sauces or condiments to meats).  SLP also suggested that pt alternate between solids and liquids during meals to facilitate clearance of boluses and to remain seated as upright as possible for 30-60 min after meals to prevent post prandial aspiration of pharyngeal residue.  SLP then provided education regarding strategies to maximize the efficiency of pt's functional communication in light of significant intelligibility deficits.  Strategies included continued use of an amplifier, trying video chat technology for phone conversations, making environmental modifications to reduce communication challenges (turn TV off, eliminate distractions, etc), and overarticulation.  Pt's husband reports that pt is near baseline for cognition and he denies having any concerns over pt's memory, concentration or problem solving.  All questions were answered to pt's and husband's satisfaction at this time.  Continue per current plan of care.    Pain Pain Assessment Pain Scale: 0-10 Pain Score: 0-No pain  Therapy/Group: Individual Therapy  Liem Copenhaver, Melanee Spry 04/29/2022, 12:41 PM

## 2022-04-29 NOTE — Progress Notes (Signed)
Physical Therapy Session Note  Patient Details  Name: Sharon Odom MRN: [id number] Date of Birth: September 05, 1970  Today's Date: 04/29/2022 PT Individual Time: 1115-1200 PT Individual Time Calculation (min): 45 min   Short Term Goals: Week 3:  PT Short Term Goal 1 (Week 3): STG=LTG due ELOS and functional decline  Skilled Therapeutic Interventions/Progress Updates:      Therapy Documentation Precautions:  Precautions Precautions: Fall Required Braces or Orthoses: Splint/Cast Splint/Cast: L resting hand splint Restrictions Weight Bearing Restrictions: No  PT present for handoff from OT and pt spouse present for family education. Pt without verbal complaints of pain. PT recommends pt family transfer at home with dependent lift and also recommends pt receive hospital bed due to decline in functional status. Pt spouse agreeable to recommendation and pt reports she wants to "walk". PT provided emotional support and educated pt on use of dependent lift with spouse for safety and energy conservation. Pt spouse able to demonstrate competency with positioning of dependent lift sling and transfer with manual hoyer lift to chair. PT also instructed patient's spouse on how to don/doff sling once pt positioned in PWC. Pt left seated in PWC at bedside with chair alarm on and all needs within reach.   Therapy/Group: Individual Therapy  [name] [name] PT, DPT  04/29/2022, 7:56 AM

## 2022-04-30 DIAGNOSIS — K592 Neurogenic bowel, not elsewhere classified: Secondary | ICD-10-CM

## 2022-04-30 LAB — CBC
HCT: 31.3 % — ABNORMAL LOW (ref 36.0–46.0)
Hemoglobin: 10.4 g/dL — ABNORMAL LOW (ref 12.0–15.0)
MCH: 32.9 pg (ref 26.0–34.0)
MCHC: 33.2 g/dL (ref 30.0–36.0)
MCV: 99.1 fL (ref 80.0–100.0)
Platelets: 171 10*3/uL (ref 150–400)
RBC: 3.16 MIL/uL — ABNORMAL LOW (ref 3.87–5.11)
RDW: 13.8 % (ref 11.5–15.5)
WBC: 6.3 10*3/uL (ref 4.0–10.5)
nRBC: 0 % (ref 0.0–0.2)

## 2022-04-30 LAB — BASIC METABOLIC PANEL
Anion gap: 6 (ref 5–15)
BUN: 18 mg/dL (ref 6–20)
CO2: 29 mmol/L (ref 22–32)
Calcium: 9.6 mg/dL (ref 8.9–10.3)
Chloride: 108 mmol/L (ref 98–111)
Creatinine, Ser: 0.71 mg/dL (ref 0.44–1.00)
GFR, Estimated: >60 mL/min (ref >60)
Glucose, Bld: 108 mg/dL — ABNORMAL HIGH (ref 70–99)
Potassium: 4.1 mmol/L (ref 3.5–5.1)
Sodium: 143 mmol/L (ref 135–145)

## 2022-04-30 NOTE — Progress Notes (Signed)
Occupational Therapy Session Note  Patient Details  Name: [name] MRN: [id number] Date of Birth: Jul 26, 1970  Today's Date: 04/30/2022 OT Individual Time: 1015-1200 OT Individual Time Calculation (min): 105 min    Short Term Goals: Week 5:  OT Short Term Goal 1 (Week 5): STG=LTG d/t ELOS with LT's modified to reflect current status and change in dispo  Skilled Therapeutic Interventions/Progress Updates:   Pt in bed upon OT arrival completing taking meds with nursing. Pt requested to complete her fruit cup and OT assisted to problem solve positioning for R UE and head proximal support for more upright and productive and successful approach. Use of plastic fork was also effective due to lightweight in nature. Nursing suggested trial with long straw and OT will assess if able to obtain. Pt assisted with mod-max A to EOB with NT [place] as standby. Max A modified squat pivot with max A to PWC. Pt then able to use tilt features to reposition and OT assisted with lateral supports. Pt was able to move to sink side and tilt forward for face, oral and UB sponge bathing. Required significant increased time and effort with fluctuating R lean, head control, R and L sided function and strength. OT worked with pt to meet pt where they are at in these areas but with such fluctuation, difficult to pinpoint needs and interventions that assist. Worked with pt on forward weight shifting and trunk control, head ROM and gentle stretch and L UE integration and coordination throughout self care re-training routine. Awaiting ATP for head positioning adjustments on PWC planned for Monday or Tuesday but not specifically known. Overall mod A for UB sink level self care with sponge bathing and pull over garments. Left pt in care of NT for lunch meal but set up with chair exit alarm, needs and call button.   Therapy Documentation Precautions:  Precautions Precautions: Fall Required Braces or Orthoses:  Splint/Cast Splint/Cast: L resting hand splint Restrictions Weight Bearing Restrictions: No     Therapy/Group: Individual Therapy  Vicenta Dunning 04/30/2022, 7:48 AM

## 2022-04-30 NOTE — Progress Notes (Signed)
Physical Therapy Session Note  Patient Details  Name: Sharon Odom MRN: [id number] Date of Birth: [birth year]  Today's Date: 04/30/2022 PT Individual Time: 0802-0900, 1300-1330 PT Individual Time Calculation (min): 58 min, 30 min   Short Term Goals: Week 3:  PT Short Term Goal 1 (Week 3): STG=LTG due ELOS and functional decline  Skilled Therapeutic Interventions/Progress Updates:      Therapy Documentation Precautions:  Precautions Precautions: Fall Required Braces or Orthoses: Splint/Cast Splint/Cast: L resting hand splint Restrictions Weight Bearing Restrictions: No  Treatment Session 1:   Pt tearful and emotional on PT arrival. Pt expressed concerns regarding her care with nursing and PT provided emotional support and notified nursing director, Cheri Guppy, that patient would like to discuss her care. Pt requires max A for rolling and total A for lower body dressing. Pt able to scoot up in bed mod A with use of rail and Trendelenburg bed feature. Pt left semi-reclined in bed with all needs in reach and bed alarm on. Pt without verbal complaints of pain in session.    Treatment Session 2:  Pt received eating meal with NT present and agreeable to PT session with emphasis on LE strengthening seated in PWC. Pt without verbal reports of pain in session. Pt requires minimal assistance to manage w/c with features for repositioning. Pt performed 2 x 10 R LE marches and 2 x 10 of resisted green theraband knee extension. PT provided bilateral heel cord stretch and PROM to bilateral knee/ankle. Pt left in care of NT to commence meal.    Therapy/Group: Individual Therapy  [name] [name] PT, DPT  04/30/2022, 7:38 AM

## 2022-04-30 NOTE — Progress Notes (Signed)
PROGRESS NOTE   Subjective/Complaints: Continues to have pain in her neck. Has not had PRN hydrocodone yet, nursing to provide this soon.   ROS: Limited due to cognitive/behavioral   Objective:   No results found. Recent Labs    04/30/22 0737  WBC 6.3  HGB 10.4*  HCT 31.3*  PLT 171      Recent Labs    04/30/22 0737  NA 143  K 4.1  CL 108  CO2 29  GLUCOSE 108*  BUN 18  CREATININE 0.71  CALCIUM 9.6       Intake/Output Summary (Last 24 hours) at 04/30/2022 1427 Last data filed at 04/30/2022 1035 Gross per 24 hour  Intake 237 ml  Output 901 ml  Net -664 ml      Pressure Injury 03/31/22 Sacrum Medial Deep Tissue Pressure Injury - Purple or maroon localized area of discolored intact skin or blood-filled blister due to damage of underlying soft tissue from pressure and/or shear. (Active)  03/31/22 0815  Location: Sacrum  Location Orientation: Medial  Staging: Deep Tissue Pressure Injury - Purple or maroon localized area of discolored intact skin or blood-filled blister due to damage of underlying soft tissue from pressure and/or shear.  Wound Description (Comments):   Present on Admission: Yes     Pressure Injury 04/03/22 Thigh Left;Posterior;Proximal Stage 2 -  Partial thickness loss of dermis presenting as a shallow open injury with a red, pink wound bed without slough. red pink (Active)  04/03/22 1751  Location: Thigh  Location Orientation: Left;Posterior;Proximal  Staging: Stage 2 -  Partial thickness loss of dermis presenting as a shallow open injury with a red, pink wound bed without slough.  Wound Description (Comments): red pink  Present on Admission: Yes    Physical Exam: Vital Signs Blood pressure 103/65, pulse 67, temperature 97.7 F (36.5 C), temperature source Oral, resp. rate 18, height 5\' 9"  (1.753 m), weight 55.2 kg, SpO2 97 %.   General: awake, alert, appropriate, sitting upbed- head  leaning to R NAD Constitutional: No distress . Vital signs reviewed. HEENT: NCAT, EOMI, oral membranes moist Neck: supple Cardiovascular: RRR without murmur. No JVD    Respiratory/Chest: CTA Bilaterally without wheezes or rales. Normal effort    GI/Abdomen: BS +, non-tender, non-distended, bac pump in abd Ext: no clubbing, cyanosis, or edema Psych: pleasant and cooperative  Neurological: Ox3- but still extremely dysarthric and almost aphonic- is worse than originally, but better than 2 days ago. Very difficult to understand. She has a device for voice amplification but battery not charged LLE- back to baseline  Skin:    General: Skin is warm and dry.     Comments: New DTI on coccyx  Also worsening skin open/pressure Stage II on posterior thigh- on R thigh Also small DTI on R heel  Neurological:     Mental Status: She is alert.     Comments: MAS 2 to 3 RLE, MAS 1+ LLE  Motor 5/5 RUE 3+ LUE RLE- HF/KE/DF and PF 4-/5- was 2/5 LLE- HF 1 to 2-/5; otherwise 2+/5 which is better than 1/5  MS: Head lolling somewhat to right, but able to bring back somewhat to midline without  assistance. Right SCM tight  Assessment/Plan: 1. Functional deficits which require 3+ hours per day of interdisciplinary therapy in a comprehensive inpatient rehab setting. Physiatrist is providing close team supervision and 24 hour management of active medical problems listed below. Physiatrist and rehab team continue to assess barriers to discharge/monitor patient progress toward functional and medical goals  Care Tool:  Bathing    Body parts bathed by patient: Right arm, Left arm, Chest, Abdomen, Face   Body parts bathed by helper: Right lower leg, Left lower leg, Front perineal area, Buttocks, Right upper leg, Left upper leg     Bathing assist Assist Level: Maximal Assistance - Patient 24 - 49%     Upper Body Dressing/Undressing Upper body dressing   What is the patient wearing?: Pull over shirt    Upper  body assist Assist Level: Moderate Assistance - Patient 50 - 74%    Lower Body Dressing/Undressing Lower body dressing      What is the patient wearing?: Underwear/pull up, Pants     Lower body assist Assist for lower body dressing: Maximal Assistance - Patient 25 - 49%     Toileting Toileting Toileting Activity did not occur (Clothing management and hygiene only): N/A (no void or bm)  Toileting assist Assist for toileting: Moderate Assistance - Patient 50 - 74%     Transfers Chair/bed transfer  Transfers assist     Chair/bed transfer assist level: 2 Helpers     Locomotion Ambulation   Ambulation assist   Ambulation activity did not occur: Safety/medical concerns (unable to perform due to pain, weakness and decreased activity tolerance)          Walk 10 feet activity   Assist  Walk 10 feet activity did not occur: Safety/medical concerns (unable to perform due to pain, weakness and decreased activity tolerance)        Walk 50 feet activity   Assist Walk 50 feet with 2 turns activity did not occur: Safety/medical concerns (unable to perform due to pain, weakness and decreased activity tolerance)         Walk 150 feet activity   Assist Walk 150 feet activity did not occur: Safety/medical concerns (unable to perform due to pain, weakness and decreased activity tolerance)         Walk 10 feet on uneven surface  activity   Assist Walk 10 feet on uneven surfaces activity did not occur: Safety/medical concerns (unable to perform due to pain, weakness and decreased activity tolerance)         Wheelchair     Assist Is the patient using a wheelchair?: Yes Type of Wheelchair: Power    Wheelchair assist level: Supervision/Verbal cueing Max wheelchair distance: 300 ft    Wheelchair 50 feet with 2 turns activity    Assist        Assist Level: Supervision/Verbal cueing   Wheelchair 150 feet activity     Assist      Assist  Level: Supervision/Verbal cueing   Blood pressure 103/65, pulse 67, temperature 97.7 F (36.5 C), temperature source Oral, resp. rate 18, height 5\' 9"  (1.753 m), weight 55.2 kg, SpO2 97 %.  Medical Problem List and Plan: 1. Functional deficits due to secondary progressive multiple sclerosis and debility from prolonged admission on acute floor for encephalopathy             -patient may  shower             -ELOS/Goals: min A to supervision- 22 days  Plan for d/c to SNF   -Continue CIR therapies including PT, OT, and SLP  2.  Impaired mobility: continue Lovenox             -antiplatelet therapy: none  - continue TEDs for BL LE swelling  Dopplers reviewed and are negative for clots.  3. Diffuse pain: not receiving Tylenol regularly, will schedule TID. Discussed alternative pain medications but she is afraid about how these could interact with her current medications. Discussed with her husband and she and he would like for her to restart Norco that she took before: started 1 tab q6H prn             -Replaced Naproxen with Mobic.              -Tylenol as needed        will change to Nucynta 75 mg q6 hours - will not add Methadone due to chance of polypharmacy   -discussed that most pain medications aside from the Tylenol and Nucynta could negatively affect cognition.   11/14- con't Nucynta- since change didn't increase dose went from 50 mg q4 to 75 mg q6 hours prn-did decrease Am baclofen to 20 mg- con't other doses at 20 mg and reduced doses of zanaflex-tomorrow, if no other issues, will reduce Valium to 2.5 mg q8 hours    11/16- Has UTI- discussed with pt Oxy vs Nucynta- I will not increase nucynta higher dose - but can change back to Oxycodone 10 mg q4 or 15 mg q6- I don't feel comfortable with 15 mg q4 hours  11/17- will increase tylenol to 1000 mg but max 3x/day- DO NOT increase pain meds over weekend, esp with lower BP lately.   11/20- will decrease ITB pump tomorrow and do trigger point  injections of shoulders/upper back/neck for pain control 11/23- TrP injections done 11/21- didn't help pain in head/neck- con't regimen for now  11/27- pt asking to increase Nucynta- if UTI treated/hallucinations improve, then I think it's reasonable to increase Nucynta to 75 mg QID prn  12/2 norco added by Dr. Carlis Abbott last night--observe today   -might be better served with long acting agent   -reviewed posture and neutral head positioning today with pt 4. Depression: continue Wellbutrin XL 300 mg daily             -amantadine 100 mg TID- per Dr Epimenio Foot 5. Neuropsych/cognition: This patient is capable of making decisions on her own behalf. 6. Skin/Wound Care: Routine skin care checks  11/7- has new DTI on coccyx, worsening now stage II on posterior R thigh and small DTI R heel- added prevalon boots in bed; pt will NOT use air mattress- changing to regular bed due to pt insistence; and foam dressing and turn q2 hours.   11/9- pt doesn't like prevalon boots, but educated on the importance 7. Fluids/Electrolytes/Nutrition: Routine Is and Os and follow-up chemistries             -continue Vitamin C, D3, B12 and fiber supplements 8: MS with spasticity: continue dalfampridine BID and [place]              -Baclofen 20 mg twice daily, 40 mg every morning             -Valium 5 mg every 8 hours             -Zanaflex 4 mg twice daily- were reduced due to cognition/sedation on acute- will change out /refill pump Thursday as well as  increase dosing of ITB pump  11/8- spasticity slightly more than normal, however increasing ITB pump tomorrow, so will not increase spasticity PO meds  11/9- increasing ITB pump today- and refilling it.   11/10- pump up to 540 mcg/day  11/14- wait on changing dose- discussed with pt x2 about if we increase ITB PUMP, will help spasticity pain, however will make her functionally weaker. Will d/w pt and husband again as well  11/20- will reduce dose tomorrow to prior dose.  11/23-  reduced dose to 485 mcg and refill date 09/09/22- on IV solumedrol x 5 days- 11/24- finish IV Solumedrol on 11/25   9: GERD: restart Zantac 10: Neurogenic bladder with indwelling Foley; s/p UTI treatment with Zosyn 11: Bowel program: Nutrisource fiber, psyllium caps, Colace, MiraLax, Senekot-S given daily             -placed PRN sorbitol and Fleets orders- has gotten really constipated/needing multiple enemas-   11/9- will increase miralax to BID x 3 days since increasing pump- no BM since admission to rehab 11/7  11/14- LBM x2 on 11/12- if no BM by tomorrow, will do Sorbitol and possibly SSE  11/15- medium-large BM today  11/17- if no BM by tomorrow- needs intervention  11/19 Sorbitol ordered  11/23- LBM yesterday  11/24- LBM last night  11/27- LBM yesterday per pt however last documented 11/23- will give Sorbitol to get her ot go after therapy today  12/2 +BM 12/1  12/4 LBM yesterday 12: Dysphagia: ? resolved; now on regular diet; SLP eval  11/27- see below 13: Seasonal allergies off home meds: monitor 14: Tooth abscess: missed follow-up appointment; completed Flagyl 15: Hypokalemia: on K+ supplement QOD; follow-up BMP tomorrow- likely due to previous Lasix dose for Edema.    11/8- K+ 4.1  11/14- K+ 4.3  Recheck tomorrow  11/27- K+ 4.2  12/4 K+ 4.1 today, stable 16. Insomnia  11/9- will add Trazodone 50 mg QHS and can titrate up as required  11/10- increase to 150 mg QHS  11/14- will decrease to 100 mg QHS since having some confusion  11/17- having weird dreams but doesn't want to stop Trazodone- explained it's from trazodone.  17. - UTI with VRE: 1x dose of Monural given on 11/28, precautions started  18. Confusion/ progressive weakness- due to high dose IV steroids  12/2 some improvement after steroids  -polypharmacy also contributing 19. Dysphagia  11/27- will order SLP to evaluate Swallowing- they've made her D2 diet- and doing MBS tomorrow to see about liquid thickness.   20. Mild swelling of multiple extremities: vascular ultrasound reviewed and negative.      LOS: 27 days A FACE TO FACE EVALUATION WAS PERFORMED  Fanny Dance 04/30/2022, 2:27 PM

## 2022-04-30 NOTE — Progress Notes (Signed)
Patient ID: Sharon Odom, female   DOB: 15-Jun-1970, 51 y.o.   MRN: [id number]  Messaged cathy-[facility] to let worker know if hear anything regarding BCBS auth. She will will update once hear something. Husband was here over the weekend for education with therapy team.

## 2022-04-30 NOTE — Progress Notes (Signed)
Speech Language Pathology Daily Session Note  Patient Details  Name: Sharon Odom MRN: [id number] Date of Birth: [birth year]  Today's Date: 04/30/2022 SLP Individual Time: 1500-1540 SLP Individual Time Calculation (min): 40 min  Short Term Goals: Week 4: SLP Short Term Goal 1 (Week 4): STG=LTG due to awaiting SNF  Skilled Therapeutic Interventions: Skilled ST treatment focused on speech and swallowing goals. Pt was in wheelchair on arrival. Nurse was present for medication pass. Pt consumed whole pills with water with sup A verbal cues for implementation of swallowing strategies and precautions for repositioning with attempts to elevate head from significant R lean. Pt was able to achieve slight elevation to midline for <5 seconds at a time and required max-to-total A from nurse to maintain positioning. Pt exhibited no overt s/sx of aspiration with consumption of thin liquids, however required cues to clear throat occasionally throughout session 2/2 congested vocal quality. Pt's speech continues to be limited by significantly reduced vocal intensity and articulatory precision impacting speech intelligibility. At times pt's speech was aphonic and unintelligible. Speech further c/b fatigue and generalized weakness. Speech amplifier was not working today. SLP attempted troubleshooting without success but left on charger. Discussed possible text-to-talk apps however pt stated she has had some difficulty holding and navigating her phone. Will explore other multimodal means of communication. Pt stated she feels near cognitive baseline with compensations in place, such as her memory notebook. Pt was tearful as she showed SLP her DNR bracelet. SLP provided emotional support. Patient was left in wheelchair with alarm activated and immediate needs within reach at end of session. Continue per current plan of care.      Pain  None/denied  Therapy/Group: Individual Therapy  Tamala Ser 04/30/2022, 4:03  PM

## 2022-05-01 NOTE — Progress Notes (Signed)
Physical Therapy Session Note  Patient Details  Name: Sharon Odom MRN: [id number] Date of Birth: Aug 10, 1970  Today's Date: 05/01/2022 PT Individual Time: 1430-1545 PT Individual Time Calculation (min): 75 min   Short Term Goals: Week 3:  PT Short Term Goal 1 (Week 3): STG=LTG due ELOS and functional decline  Skilled Therapeutic Interventions/Progress Updates:      Therapy Documentation Precautions:  Precautions Precautions: Fall Required Braces or Orthoses: Splint/Cast Splint/Cast: L resting hand splint Restrictions Weight Bearing Restrictions: No  Pt tearful and emotional throughout PT session and provided comfort and support. Pt states her goal is to walk and work on standing. PT educated pt that walking is not safe due to pt's decreased postural control and generalized weakness. Pt continues to want to work on standing and PT open to try standing frame as this technique may provide additional truncal support. Pt requires +2 for set-up and with unsuccessful attempt in standing frame and pt re-positioned in PWC. Therapist controlled w/c features for time management and navigated PWC to room. Pt left with all needs in reach and in PWC with alarm on.    Therapy/Group: Individual Therapy  [name] [name] PT, DPT  05/01/2022, 7:49 AM

## 2022-05-01 NOTE — Plan of Care (Signed)
  Problem: RH Balance Goal: LTG: Patient will maintain dynamic sitting balance (OT) Description: LTG:  Patient will maintain dynamic sitting balance with assistance during activities of daily living (OT) Outcome: Progressing   Problem: RH Toileting Goal: LTG Patient will perform toileting task (3/3 steps) with assistance level (OT) Description: LTG: Patient will perform toileting task (3/3 steps) with assistance level (OT)  Outcome: Progressing Note: Bed level    Problem: RH Simple Meal Prep Goal: LTG Patient will perform simple meal prep w/assist (OT) Description: LTG: Patient will perform simple meal prep with assistance, with/without cues (OT). Outcome: Not Applicable

## 2022-05-01 NOTE — Patient Care Conference (Signed)
Inpatient RehabilitationTeam Conference and Plan of Care Update Date: 05/01/2022   Time: 11:30 AM    Patient Name: Sharon Odom      Medical Record Number: [id number]  Date of Birth: [birth year] Sex: Female         Room/Bed: 4W09C/4W09C-01 Payor Info: Payor: BLUE CROSS BLUE [employer]IELD / Plan: BCBS OTHER / Product Type: *No Product type* /    Admit Date/Time:  04/03/2022  4:29 PM  Primary Diagnosis:  Multiple sclerosis [facility])  Hospital Problems: Principal Problem:   Multiple sclerosis (HCC) Active Problems:   Spasticity   Pressure injury of skin    Expected Discharge Date: Expected Discharge Date:  ([facility]P)  Team Members Present: Physician leading conference: Dr. Fanny Dance Social Worker Present: Dossie Der, LCSW Nurse Present: Vedia Pereyra, RN PT Present: [name], PT OT Present: Valetta Fuller, OT SLP Present: Feliberto Gottron, SLP PPS Coordinator present : Fae Pippin, SLP     Current Status/Progress Goal Weekly Team Focus  Bowel/Bladder    Neurogenic bladder and bowel. Foley in place   Continent with bowel and reduced incidence of UTI    Assess for incontinence of bowel and provide appropriate foley care PRN   Swallow/Nutrition/ Hydration   dys 1 diet with thin liquids recommended by SLP per MBS findings. Pt made decision to consume regular textures and thin liquids following extensive education of deficits and is accepting of potential risks of aspiration and adverse health events attributed to aspiration   no longer addressing goal       ADL's   mod A UB pull on dressing, min A simple grooming and self feeding with set up and head control assist and increased time, max A transfer   mod A LB self care, toilet transfer   Family Educ completed with husband for AE and DME use, positioning    Mobility   max A bed mobility and +2 squat pivot transfers   family edu goal & mod I w/c navigation  d/c planning, continue pt/fam education, performed hoyer transfers,  PWC modifications    Communication   mod-to-max A   mod A (goal downgraded 12/01)   amplification, environmental modifications    Safety/Cognition/ Behavioral Observations  min-to-mod A varies per team. Spouse reported pt appears near cognitive baseline   Min A - downgraded on 11/13   problem solving, emergent awareness, recall with aids, education    Pain    10/10 pain to neck and legs and feet.     4 out of 10 pain with prn medications   Assess every every 4 hours, prior to therapy, and PRN   Skin    Area to left thigh is healing. Sacrum is healing. DTI to left heel has attached edges.    Skin will be free of any additional infection/breakdown   Assess every shift and prn      Discharge Planning:  Awaiting BCBS auth to see if will cover SNF for pt. Have reached out to [facility] healthcare regarding decision. Husband did come in for education Sunday to learn her care in case SNF is denied   Team Discussion: MS. Foley in place. Incontinent of BM. Pain uncontrolled. Skin/wounds are healing. Therapy changed to QD. Waiting to hear back from [employer] No mobility goals. Getting head support for electric wheelchair. Mod A with upper body dressing. Max A to transfer. Increase time to eat with break from therapy 7-9:30 to eat. Receptive to adaptive equipment. MBS last week but patient insist on Regular  diet and thin liquids.  Patient on target to meet rehab goals: Mobility goals removed.   *See Care Plan and progress notes for long and short-term goals.   Revisions to Treatment Plan:  Medication adjustments, therapy QD, head support for w/c  Teaching Needs: Medications, safety, skin/wound care, transfer training, etc.   Current Barriers to Discharge: Decreased caregiver support, Incontinence, Neurogenic bowel and bladder, Wound care, Lack of/limited family support, Insurance for SNF coverage, and Behavior  Possible Resolutions to Barriers: Family education, nursing  education, order recommended DME     Medical Summary Current Status: MS, spasticity, neurogenic bowel and bladder, hypokalemia, dysphagia  Barriers to Discharge: Uncontrolled Pain;Medical stability;Electrolyte abnormality  Barriers to Discharge Comments: MS, spasticity, neurogenic bowel and bladder, hypokalemia, dysphagia, confusion Possible Resolutions to [employer] Focus: continue pain medications, monitor lytes, monitor bowel and bladder function-continue foley   Continued Need for Acute Rehabilitation Level of Care: The patient requires daily medical management by a physician with specialized training in physical medicine and rehabilitation for the following reasons: Direction of a multidisciplinary physical rehabilitation program to maximize functional independence : Yes Medical management of patient stability for increased activity during participation in an intensive rehabilitation regime.: Yes Analysis of laboratory values and/or radiology reports with any subsequent need for medication adjustment and/or medical intervention. : Yes   I attest that I was present, lead the team conference, and concur with the assessment and plan of the team.   Jearld Adjutant 05/01/2022, 3:35 PM

## 2022-05-01 NOTE — Progress Notes (Signed)
Occupational Therapy Session Note  Patient Details  Name: Sharon Odom MRN: [id number] Date of Birth: 1970/07/08  Today's Date: 05/01/2022 OT Individual Time: 1696-7893 OT Individual Time Calculation (min): 75 min    Short Term Goals: Week 5:  OT Short Term Goal 1 (Week 5): STG=LTG d/t ELOS with LT's modified to reflect current status and change in dispo  Skilled Therapeutic Interventions/Progress Updates:  Self feeding focus with adaptive techniques including multiple head positioning devices, altering consistencies for ease of access via lightweight plastic utensils and trial with long adaptive straw. Head positioning continues to limit function and ATP coming in today to add components to assist in PWC. Increased time and effort however min a overall for self feeding. Oral care min A after set up. LB self care with pants and Foley threading max A. Supine to sit with mod-max a. Worked on EOB with min A after alignement with r UE support of footboard. Transfer max A modified squat pivot with S of NT for safety bed to PWC. Pt use of controls for full tilt to readjust hips and reposition self with CGA and cues. Increased time and effort for UB pullover shirt mod A. OT must assist with bra and camisole due to complexity and trunk control. Left pt in care of NT to complete set up however OT was able to set chair alarm, nurse call button and tray needs in place.     Therapy Documentation Precautions:  Precautions Precautions: Fall Required Braces or Orthoses: Splint/Cast Splint/Cast: L resting hand splint Restrictions Weight Bearing Restrictions: No    Therapy/Group: Individual Therapy  Vicenta Dunning 05/01/2022, 7:47 AM

## 2022-05-01 NOTE — Plan of Care (Signed)
After speaking with care team in conference, patient's plan of care adjusted to QD for PT, OT, and ST.

## 2022-05-01 NOTE — Progress Notes (Addendum)
Patient ID: Sharon Odom, female   DOB: 12/05/1970, 51 y.o.   MRN: [id number]  Met with pt and spoke with husband-Dave via telephone to update both on team conference progress and being made qd in therapies due to limited progress. Husband was educated on Sunday with her care and showed how to use a hoyer lift in case insurance denies and plan changes to home. Will try to get BCBS or Medicare part A to cover Snf. Stalls here to try to get neck and head support for her power chair.   2:05 PM Spoke with Angie-CM for [employer] who was asking questions regarding plan for discharge. Made aware trying to get BCBS to cover SNF and am awaiting determination for this. She has no new information regarding auth. She will continue to follow regarding discharge.

## 2022-05-01 NOTE — Progress Notes (Signed)
Physical Therapy Weekly Progress Note  Patient Details  Name: Sharon Odom MRN: [id number] Date of Birth: 01-Aug-1970  Beginning of progress report period: April 04, 2022 End of progress report period: May 01, 2022  Patient progressing toward long term goals, goals modified and downgraded last progress period due to decline in functional mobility status. Pt requires max A for bed mobility and transfers. ATP made modifications to PWC to improve positioning and awaiting return to increase head support as pt with significant right cervical lateral flexion and unable to self-correct due to weakness. Pt and spouse present for education regarding dependent manual hoyer lift transfers and spouse demonstrates competency with skill and providing appropriate level of assistance to patient. Plan to continue patient and family education and modify PWC to improve positioning.   Patient continues to demonstrate the following deficits muscle weakness and muscle joint tightness, decreased cardiorespiratoy endurance, impaired timing and sequencing, abnormal tone, unbalanced muscle activation, motor apraxia, ataxia, decreased coordination, and decreased motor planning, decreased awareness, decreased problem solving, decreased safety awareness, decreased memory, and delayed processing, and decreased sitting balance, decreased standing balance, decreased postural control, and decreased balance strategies and therefore will continue to benefit from skilled PT intervention to increase functional independence with mobility.  Patient progressing toward long term goals..  Continue plan of care.  PT Short Term Goals Week 4:  PT Short Term Goal 1 (Week 4): STG=LTG due to extended LOS  Skilled Therapeutic Interventions/Progress Updates:      Therapy Documentation Precautions:  Precautions Precautions: Fall Required Braces or Orthoses: Splint/Cast Splint/Cast: L resting hand splint Restrictions Weight Bearing  Restrictions: No    Therapy/Group: Individual Therapy  [name] [name] PT, DPT  05/01/2022, 7:53 AM

## 2022-05-01 NOTE — Progress Notes (Signed)
Speech Language Pathology Weekly Progress and Session Note  Patient Details  Name: Sharon Odom MRN: [id number] Date of Birth: [birth year]  Beginning of progress report period: April 24, 2022 End of progress report period: May 01, 2022  Today's Date: 05/01/2022 SLP Individual Time: 7591-6384 SLP Individual Time Calculation (min): 65 min  Short Term Goals: Week 4: SLP Short Term Goal 1 (Week 4): STG=LTG due to awaiting SNF    New Short Term Goals: Week 5:    Weekly Progress Updates:     Intensity:   Frequency:   Duration/Length of Stay:   Treatment/Interventions:     Daily Session  Skilled Therapeutic Interventions: ***     General    Pain    Therapy/Group: Individual Therapy  Reverie Vaquera A Derrious Bologna 05/01/2022, 2:18 PM

## 2022-05-01 NOTE — Progress Notes (Signed)
PROGRESS NOTE   Subjective/Complaints:  Discussed pain and spasticity meds as well as fatigue and sedation  Discussed nucynta less sedating than hydrocodone and baclofen less sedating than tizanidine   ROS: Limited due to cognitive/behavioral   Objective:   No results found. Recent Labs    04/30/22 0737  WBC 6.3  HGB 10.4*  HCT 31.3*  PLT 171       Recent Labs    04/30/22 0737  NA 143  K 4.1  CL 108  CO2 29  GLUCOSE 108*  BUN 18  CREATININE 0.71  CALCIUM 9.6        Intake/Output Summary (Last 24 hours) at 05/01/2022 0740 Last data filed at 04/30/2022 1820 Gross per 24 hour  Intake 117 ml  Output 500 ml  Net -383 ml      Pressure Injury 03/31/22 Sacrum Medial Deep Tissue Pressure Injury - Purple or maroon localized area of discolored intact skin or blood-filled blister due to damage of underlying soft tissue from pressure and/or shear. (Active)  03/31/22 0815  Location: Sacrum  Location Orientation: Medial  Staging: Deep Tissue Pressure Injury - Purple or maroon localized area of discolored intact skin or blood-filled blister due to damage of underlying soft tissue from pressure and/or shear.  Wound Description (Comments):   Present on Admission: Yes     Pressure Injury 04/03/22 Thigh Left;Posterior;Proximal Stage 2 -  Partial thickness loss of dermis presenting as a shallow open injury with a red, pink wound bed without slough. red pink (Active)  04/03/22 1751  Location: Thigh  Location Orientation: Left;Posterior;Proximal  Staging: Stage 2 -  Partial thickness loss of dermis presenting as a shallow open injury with a red, pink wound bed without slough.  Wound Description (Comments): red pink  Present on Admission: Yes    Physical Exam: Vital Signs Blood pressure 109/64, pulse 70, temperature 99.1 F (37.3 C), temperature source Oral, resp. rate 18, height 5\' 9"  (1.753 m), weight 55.2 kg,  SpO2 97 %.   General: awake, alert, appropriate, sitting upbed- head leaning to R NAD  General: No acute distress Mood and affect are appropriate Heart: Regular rate and rhythm no rubs murmurs or extra sounds Lungs: Clear to auscultation, breathing unlabored, no rales or wheezes Abdomen: Positive bowel sounds, soft nontender to palpation, nondistended Extremities: No clubbing, cyanosis, or edema Skin: No evidence of breakdown, no evidence of rash Neurologic: hypophonic , pocket talker not functioning   LLE- back to baseline  Skin:    General: Skin is warm and dry.     Comments: New DTI on coccyx  Also worsening skin open/pressure Stage II on posterior thigh- on R thigh Also small DTI on R heel  Neurological:     Mental Status: She is alert.     Comments: MAS 2 to 3 RLE, MAS 1+ LLE  Motor 5/5 RUE 3+ LUE RLE- HF/KE/DF and PF 4-/5-  LLE- HF 1 to 2-/5; PF/DF 1/5 Tone clonus 4 beats RIght ankle absent clonus on left   Assessment/Plan: 1. Functional deficits which require 3+ hours per day of interdisciplinary therapy in a comprehensive inpatient rehab setting. Physiatrist is providing close team supervision and 24  hour management of active medical problems listed below. Physiatrist and rehab team continue to assess barriers to discharge/monitor patient progress toward functional and medical goals  Care Tool:  Bathing    Body parts bathed by patient: Right arm, Left arm, Chest, Abdomen, Face   Body parts bathed by helper: Right lower leg, Left lower leg, Front perineal area, Buttocks, Right upper leg, Left upper leg     Bathing assist Assist Level: Maximal Assistance - Patient 24 - 49%     Upper Body Dressing/Undressing Upper body dressing   What is the patient wearing?: Pull over shirt    Upper body assist Assist Level: Moderate Assistance - Patient 50 - 74%    Lower Body Dressing/Undressing Lower body dressing      What is the patient wearing?: Underwear/pull up,  Pants     Lower body assist Assist for lower body dressing: Maximal Assistance - Patient 25 - 49%     Toileting Toileting Toileting Activity did not occur (Clothing management and hygiene only): N/A (no void or bm)  Toileting assist Assist for toileting: Moderate Assistance - Patient 50 - 74%     Transfers Chair/bed transfer  Transfers assist     Chair/bed transfer assist level: 2 Helpers     Locomotion Ambulation   Ambulation assist   Ambulation activity did not occur: Safety/medical concerns (unable to perform due to pain, weakness and decreased activity tolerance)          Walk 10 feet activity   Assist  Walk 10 feet activity did not occur: Safety/medical concerns (unable to perform due to pain, weakness and decreased activity tolerance)        Walk 50 feet activity   Assist Walk 50 feet with 2 turns activity did not occur: Safety/medical concerns (unable to perform due to pain, weakness and decreased activity tolerance)         Walk 150 feet activity   Assist Walk 150 feet activity did not occur: Safety/medical concerns (unable to perform due to pain, weakness and decreased activity tolerance)         Walk 10 feet on uneven surface  activity   Assist Walk 10 feet on uneven surfaces activity did not occur: Safety/medical concerns (unable to perform due to pain, weakness and decreased activity tolerance)         Wheelchair     Assist Is the patient using a wheelchair?: Yes Type of Wheelchair: Power    Wheelchair assist level: Supervision/Verbal cueing Max wheelchair distance: 300 ft    Wheelchair 50 feet with 2 turns activity    Assist        Assist Level: Supervision/Verbal cueing   Wheelchair 150 feet activity     Assist      Assist Level: Supervision/Verbal cueing   Blood pressure 109/64, pulse 70, temperature 99.1 F (37.3 C), temperature source Oral, resp. rate 18, height 5\' 9"  (1.753 m), weight 55.2 kg,  SpO2 97 %.  Medical Problem List and Plan: 1. Functional deficits due to secondary progressive multiple sclerosis and debility from prolonged admission on acute floor for encephalopathy             -patient may  shower             -ELOS/Goals: min A to supervision- 22 days  Plan for d/c to SNF   -Continue CIR therapies including PT, OT, and SLP  2.  Impaired mobility: continue Lovenox             -  antiplatelet therapy: none  - continue TEDs for BL LE swelling  Dopplers reviewed and are negative for clots.  3. Diffuse pain: not receiving Tylenol regularly, will schedule TID. Discussed alternative pain medications but she is afraid about how these could interact with her current medications. Nucynta 75mg  , was only taking twice a day, encouraged 4 x per day may go up to 100mg  if needed Will d/c tizanidine - tone is not severe and this may be contributing to fatigue    4. Depression: continue Wellbutrin XL 300 mg daily             -amantadine 100 mg TID- per Dr Epimenio Foot 5. Neuropsych/cognition: This patient is capable of making decisions on her own behalf. 6. Skin/Wound Care: Routine skin care checks  11/7- has new DTI on coccyx, worsening now stage II on posterior R thigh and small DTI R heel- added prevalon boots in bed; pt will NOT use air mattress- changing to regular bed due to pt insistence; and foam dressing and turn q2 hours.   11/9- pt doesn't like prevalon boots, but educated on the importance 7. Fluids/Electrolytes/Nutrition: Routine Is and Os and follow-up chemistries             -continue Vitamin C, D3, B12 and fiber supplements 8: MS with spasticity: continue dalfampridine BID and Sanctura              -Baclofen 20 mg twice daily, 40 mg every morning             -Valium 5 mg every 8 hours             -Zanaflex 4 mg twice daily- were reduced due to cognition/sedation on acute- will change out /refill pump Thursday as well as increase dosing of ITB pump  11/8- spasticity slightly  more than normal, however increasing ITB pump tomorrow, so will not increase spasticity PO meds  11/9- increasing ITB pump today- and refilling it.   11/10- pump up to 540 mcg/day  11/14- wait on changing dose- discussed with pt x2 about if we increase ITB PUMP, will help spasticity pain, however will make her functionally weaker. Will d/w pt and husband again as well  11/20- will reduce dose tomorrow to prior dose.  11/23- reduced dose to 485 mcg and refill date 09/09/22- on IV solumedrol x 5 days- 11/24- finish IV Solumedrol on 11/25   9: GERD: restart Zantac 10: Neurogenic bladder with indwelling Foley; s/p UTI treatment with Zosyn 11: Bowel program: Nutrisource fiber, psyllium caps, Colace, MiraLax, Senekot-S given daily             -placed PRN sorbitol and Fleets orders- has gotten really constipated/needing multiple enemas-   11/9- will increase miralax to BID x 3 days since increasing pump- no BM since admission to rehab 11/7  11/14- LBM x2 on 11/12- if no BM by tomorrow, will do Sorbitol and possibly SSE  11/15- medium-large BM today  11/17- if no BM by tomorrow- needs intervention  11/19 Sorbitol ordered  11/23- LBM yesterday  11/24- LBM last night  11/27- LBM yesterday per pt however last documented 11/23- will give Sorbitol to get her ot go after therapy today  12/2 +BM 12/1  12/4 LBM yesterday 12: Dysphagia: ? resolved; now on regular diet; SLP eval  11/27- see below 13: Seasonal allergies off home meds: monitor 14: Tooth abscess: missed follow-up appointment; completed Flagyl 15: Hypokalemia: on K+ supplement QOD; follow-up BMP tomorrow- likely due to previous  Lasix dose for Edema.    11/8- K+ 4.1  11/14- K+ 4.3  Recheck tomorrow  11/27- K+ 4.2  12/4 K+ 4.1 today, stable 16. Insomnia  11/9- will add Trazodone 50 mg QHS and can titrate up as required  11/10- increase to 150 mg QHS  11/14- will decrease to 100 mg QHS since having some confusion  11/17- having weird  dreams but doesn't want to stop Trazodone- explained it's from trazodone.  17. - UTI with VRE: 1x dose of Monural given on 11/28, precautions started  18. Confusion/ progressive weakness- due to high dose IV steroids  12/2 some improvement after steroids  -polypharmacy also contributing 19. Dysphagia  11/27- will order SLP to evaluate Swallowing- they've made her D2 diet- and doing MBS tomorrow to see about liquid thickness.  20. Mild swelling of multiple extremities: vascular ultrasound reviewed and negative.      LOS: 28 days A FACE TO FACE EVALUATION WAS PERFORMED  Erick Colace 05/01/2022, 7:40 AM

## 2022-05-01 NOTE — Progress Notes (Signed)
Patient ID: Sharon Odom, female   DOB: [birth year], 51 y.o.   MRN: [id number]  Left message for [facility] regarding [employer] auth. Will await return call.

## 2022-05-02 MED ORDER — MODAFINIL 100 MG PO TABS: 100.0000 mg | ORAL_TABLET | Freq: Every day | ORAL | Status: DC

## 2022-05-02 MED ORDER — TAPENTADOL HCL 50 MG PO TABS
75.0000 mg | ORAL_TABLET | Freq: Three times a day (TID) | ORAL | Status: DC
  Administered 2022-05-02 – 2022-05-04 (×9): 75 mg via ORAL
  Filled 2022-05-02 (×9): qty 2

## 2022-05-02 NOTE — Plan of Care (Signed)
Cognitive goals met at Sup A to Min A level with performance fluctuating based on fatigue/lethargy. Continue to address verbal expression via speech intelligibility strategies, environmental modifications, and trialing alphabet and/or topic board.  Problem: RH Expression Communication Goal: LTG Patient will increase speech intelligibility (SLP) Description: LTG: Patient will increase speech intelligibility at word/phrase/conversation level with cues, % of the time (SLP) Flowsheets Taken 05/02/2022 0621 LTG: Patient will increase speech intelligibility (SLP):  Maximal Assistance - Patient 25 - 49%  Moderate Assistance - Patient 50 - 74% Taken 04/23/2022 2004 Level: Phrase Percent of time patient will use intelligible speech: 50   Problem: RH Expression Communication Goal: LTG Patient will express needs/wants via multi-modal(SLP) Description: LTG:  Patient will express needs/wants via multi-modal communication (gestures/written, etc) with cues (SLP) Flowsheets (Taken 05/02/2022 0623) LTG: Patient will express needs/wants via multimodal communication (gestures/written, etc) with cueing (SLP):  Moderate Assistance - Patient 50 - 74%  Maximal Assistance - Patient 25 - 49%

## 2022-05-02 NOTE — Progress Notes (Signed)
PROGRESS NOTE   Subjective/Complaints:  Poor energy level, ha been on Amantadine, denies having tried modafanil in past.  Reviewed allergies  Has Foley cath  ROS: Limited due to cognitive  Objective:   No results found. Recent Labs    04/30/22 0737  WBC 6.3  HGB 10.4*  HCT 31.3*  PLT 171       Recent Labs    04/30/22 0737  NA 143  K 4.1  CL 108  CO2 29  GLUCOSE 108*  BUN 18  CREATININE 0.71  CALCIUM 9.6        Intake/Output Summary (Last 24 hours) at 05/02/2022 1210 Last data filed at 05/02/2022 1610 Gross per 24 hour  Intake 816 ml  Output 950 ml  Net -134 ml      Pressure Injury 03/31/22 Sacrum Medial Deep Tissue Pressure Injury - Purple or maroon localized area of discolored intact skin or blood-filled blister due to damage of underlying soft tissue from pressure and/or shear. (Active)  03/31/22 0815  Location: Sacrum  Location Orientation: Medial  Staging: Deep Tissue Pressure Injury - Purple or maroon localized area of discolored intact skin or blood-filled blister due to damage of underlying soft tissue from pressure and/or shear.  Wound Description (Comments):   Present on Admission: Yes     Pressure Injury 04/03/22 Thigh Left;Posterior;Proximal Stage 2 -  Partial thickness loss of dermis presenting as a shallow open injury with a red, pink wound bed without slough. red pink (Active)  04/03/22 1751  Location: Thigh  Location Orientation: Left;Posterior;Proximal  Staging: Stage 2 -  Partial thickness loss of dermis presenting as a shallow open injury with a red, pink wound bed without slough.  Wound Description (Comments): red pink  Present on Admission: Yes    Physical Exam: Vital Signs Blood pressure (!) 111/57, pulse 76, temperature 98.6 F (37 C), temperature source Oral, resp. rate 18, height  (1.753 m), weight 55.2 kg, SpO2 95 %.   General: awake, alert, appropriate,  sitting upbed- head leaning to R NAD  General: No acute distress Mood and affect are appropriate Heart: Regular rate and rhythm no rubs murmurs or extra sounds Lungs: Clear to auscultation, breathing unlabored, no rales or wheezes Abdomen: Positive bowel sounds, soft nontender to palpation, nondistended Extremities: No clubbing, cyanosis, or edema Skin: No evidence of breakdown, no evidence of rash Neurologic: hypophonic , pocket talker not functioning     Neurological:     Mental Status: She is fatigued     Comments: MAS 2 to 3 RLE, MAS 1+ LLE  Cervial dystonia with torticollis nd lteral coolis , head turned to the right, hypertonicity Right trap and left SCM Motor 5/5 RUE 3+ LUE RLE- HF/KE/DF and PF 4-/5-  LLE- HF 1 to 2-/5; PF/DF 1/5 Tone clonus 4 beats RIght ankle absent clonus on left   Assessment/Plan: 1. Functional deficits which require 3+ hours per day of interdisciplinary therapy in a comprehensive inpatient rehab setting. Physiatrist is providing close team supervision and 24 hour management of active medical problems listed below. Physiatrist and rehab team continue to assess barriers to discharge/monitor patient progress toward functional and medical goals  Care Tool:  Bathing    Body parts bathed by patient: Right arm, Left arm, Chest, Abdomen, Face   Body parts bathed by helper: Right lower leg, Left lower leg, Front perineal area, Buttocks, Right upper leg, Left upper leg     Bathing assist Assist Level: Maximal Assistance - Patient 24 - 49%     Upper Body Dressing/Undressing Upper body dressing   What is the patient wearing?: Pull over shirt    Upper body assist Assist Level: Moderate Assistance - Patient 50 - 74%    Lower Body Dressing/Undressing Lower body dressing      What is the patient wearing?: Underwear/pull up, Pants     Lower body assist Assist for lower body dressing: Maximal Assistance - Patient 25 - 49%     Toileting Toileting  Toileting Activity did not occur (Clothing management and hygiene only): N/A (no void or bm)  Toileting assist Assist for toileting: Moderate Assistance - Patient 50 - 74%     Transfers Chair/bed transfer  Transfers assist     Chair/bed transfer assist level: 2 Helpers     Locomotion Ambulation   Ambulation assist   Ambulation activity did not occur: Safety/medical concerns (unable to perform due to pain, weakness and decreased activity tolerance)          Walk 10 feet activity   Assist  Walk 10 feet activity did not occur: Safety/medical concerns (unable to perform due to pain, weakness and decreased activity tolerance)        Walk 50 feet activity   Assist Walk 50 feet with 2 turns activity did not occur: Safety/medical concerns (unable to perform due to pain, weakness and decreased activity tolerance)         Walk 150 feet activity   Assist Walk 150 feet activity did not occur: Safety/medical concerns (unable to perform due to pain, weakness and decreased activity tolerance)         Walk 10 feet on uneven surface  activity   Assist Walk 10 feet on uneven surfaces activity did not occur: Safety/medical concerns (unable to perform due to pain, weakness and decreased activity tolerance)         Wheelchair     Assist Is the patient using a wheelchair?: Yes Type of Wheelchair: Power    Wheelchair assist level: Supervision/Verbal cueing Max wheelchair distance: 300 ft    Wheelchair 50 feet with 2 turns activity    Assist        Assist Level: Supervision/Verbal cueing   Wheelchair 150 feet activity     Assist      Assist Level: Supervision/Verbal cueing   Blood pressure (!) 111/57, pulse 76, temperature 98.6 F (37 C), temperature source Oral, resp. rate 18, height 5\' 9"  (1.753 m), weight 55.2 kg, SpO2 95 %.  Medical Problem List and Plan: 1. Functional deficits due to secondary progressive multiple sclerosis and  debility from prolonged admission on acute floor for encephalopathy             -patient may  shower             -ELOS/Goals: min A to supervision- 22 days  Plan for d/c to SNF   -Continue CIR therapies including PT, OT, and SLP  2.  Impaired mobility: continue Lovenox             -antiplatelet therapy: none  - continue TEDs for BL LE swelling  Dopplers reviewed and are negative for clots.  3. Diffuse pain: not receiving  Tylenol regularly, will schedule TID. Discussed alternative pain medications but she is afraid about how these could interact with her current medications. Nucynta 75mg  , was only taking twice a day, encouraged 4 x per day may go up to 100mg  if needed Will d/c tizanidine - tone is not severe and this may be contributing to fatigue    4. Depression: continue Wellbutrin XL 300 mg daily             -amantadine 100 mg TID- per Dr 5. Neuropsych/cognition: This patient is capable of making decisions on her own behalf. 6. Skin/Wound Care: Routine skin care checks  11/7- has new DTI on coccyx, worsening now stage II on posterior R thigh and small DTI R heel- added prevalon boots in bed; pt will NOT use air mattress- changing to regular bed due to pt insistence; and foam dressing and turn q2 hours.   11/9- pt doesn't like prevalon boots, but educated on the importance 7. Fluids/Electrolytes/Nutrition: Routine Is and Os and follow-up chemistries             -continue Vitamin C, D3, B12 and fiber supplements 8: MS with spasticity: continue dalfampridine BID and Sanctura              -Baclofen 20 mg twice daily, 40 mg every morning             -Valium 5 mg every 8 hours             -Zanaflex 4 mg d/ced   due to cognition/sedation - will change out /refill pump Thursday as well as increase dosing of ITB pump  11/8- spasticity slightly more than normal, however increasing ITB pump tomorrow, so will not increase spasticity PO meds  11/9- increasing ITB pump today- and refilling it.    11/10- pump up to 540 mcg/day  11/14- wait on changing dose- discussed with pt x2 about if we increase ITB PUMP, will help spasticity pain, however will make her functionally weaker. Will d/w pt and husband again as well  11/20- will reduce dose tomorrow to prior dose.  11/23- reduced dose to 485 mcg and refill date 09/09/22- on IV solumedrol x 5 days- 11/24- finish IV Solumedrol on 11/25   9: GERD: restart Zantac 10: Neurogenic bladder with indwelling Foley; s/p UTI treatment with Zosyn 11: Bowel program: Nutrisource fiber, psyllium caps, Colace, MiraLax, Senekot-S given daily         LBM on 12/5 12: Dysphagia: ? resolved; now on regular diet; SLP eval  11/27- see below 13: Seasonal allergies off home meds: monitor 14: Tooth abscess: missed follow-up appointment; completed Flagyl 15: Hypokalemia: on K+ supplement QOD; follow-up BMP tomorrow- likely due to previous Lasix dose for Edema.    11/8- K+ 4.1  11/14- K+ 4.3  Recheck tomorrow  11/27- K+ 4.2  12/4 K+ 4.1 today, stable 16. Insomnia  11/9- will add Trazodone 50 mg QHS and can titrate up as required  11/10- increase to 150 mg QHS  11/14- will decrease to 100 mg QHS since having some confusion  11/17- having weird dreams but doesn't want to stop Trazodone- explained it's from trazodone.  17. - UTI with VRE: 1x dose of Monural given on 11/28, precautions started  18. Confusion/ progressive weakness- due to high dose IV steroids  12/2 some improvement after steroids  -polypharmacy also contributing, no off tizanidine, hydrocodone Will d/c amantadine and use modafanil for fatigue 19. Dysphagia  11/27- will order SLP to evaluate  Swallowing- they've made her D2 diet- and doing MBS tomorrow to see about liquid thickness.  20. Mild swelling of multiple extremities: vascular ultrasound reviewed and negative.      LOS: 29 days A FACE TO FACE EVALUATION WAS PERFORMED  Erick Colace 05/02/2022, 12:10 PM

## 2022-05-02 NOTE — Progress Notes (Signed)
Patient ID: Sharon Odom, female   DOB: January 19, 1971, 51 y.o.   MRN: [id number]  Did talk with [facility] who reports no news yet from [facility] but she will call today to see if made a decision. Pt and husband aware of this and hopeful she will get approved. Jason-Stalls coming Thursday to adjust the head rest of chair and make more supportive for her head/neck

## 2022-05-02 NOTE — Progress Notes (Signed)
Physical Therapy Session Note  Patient Details  Name: Carlyon Nolasco MRN: [id number] Date of Birth: 1970/10/24  Today's Date: 05/02/2022 PT Individual Time: 1300-1330 PT Individual Time Calculation (min): 30 min   Short Term Goals: Week 4:  PT Short Term Goal 1 (Week 4): STG=LTG due to extended LOS  Skilled Therapeutic Interventions/Progress Updates:      Therapy Documentation Precautions:  Precautions Precautions: Fall Required Braces or Orthoses: Splint/Cast Splint/Cast: L resting hand splint Restrictions Weight Bearing Restrictions: No  Pt agreeable to PT session with emphasis on manual therapy to address flexibility deficits. PT provided soft tissue mobilization and trigger point therapy to bilateral upper trapezius muscle and SCM. Pt performed active assist range of motion to cervical neck musculature and pt able to maintain and hold upright position ~5 seconds. Pt left seated in PWC at with alarm on and all needs within reach.    Therapy/Group: Individual Therapy  [name] [name] PT, DPT  05/02/2022, 7:47 AM

## 2022-05-02 NOTE — Progress Notes (Signed)
Occupational Therapy Session Note  Patient Details  Name: Sharon Odom MRN: [id number] Date of Birth: 01-30-1971  Today's Date: 05/02/2022 OT Individual Time: 6962-9528 OT Individual Time Calculation (min): 45 min    Short Term Goals: Week 5:  OT Short Term Goal 1 (Week 5): STG=LTG d/t ELOS with LT's modified to reflect current status and change in dispo  Skilled Therapeutic Interventions/Progress Updates:   Pt seen for skilled OT session with focus on streamlining AM self care routine now due to significant increased overall need for assistance and L sided weakness with poor head and trunk control. OT suggested pullover dress to decrease time and effort with LB dressing as pt with Foley catheter, TED hose needs in addition to multi-step, complex grooming routine with extended time for all including feeding. OT attempting to allow adequate time to meet pt's needs while assisting to reduce caregiver burden with staff nursing here and in the future. Pt continued to request full LB garment dressing this visit and required max a bed level. Standby for PWC transfer with NT with OT providing max a for supine to sit, EOB with mod fading to min A once positioned with R UE bed footboard. Max A x1 S x 1 for safety to PWC. Adjusted via tilt. Mod A pullover cami and cardigan sweater hemi techniques with significant facil needed for head and trunk. . Left pt in care of NT for lunch meal but set up with chair exit alarm, needs and call button.   Therapy Documentation Precautions:  Precautions Precautions: Fall Required Braces or Orthoses: Splint/Cast Splint/Cast: L resting hand splint Restrictions Weight Bearing Restrictions: No   Therapy/Group: Individual Therapy  Vicenta Dunning 05/02/2022, 7:45 AM

## 2022-05-03 MED ORDER — MODAFINIL 100 MG PO TABS
100.0000 mg | ORAL_TABLET | Freq: Every day | ORAL | Status: DC
  Administered 2022-05-03 – 2022-05-04 (×2): 100 mg via ORAL
  Filled 2022-05-03 (×2): qty 1

## 2022-05-03 MED ORDER — MODAFINIL 100 MG PO TABS: 200.0000 mg | ORAL_TABLET | Freq: Every day | ORAL | Status: DC

## 2022-05-03 NOTE — Progress Notes (Signed)
Occupational Therapy Session Note  Patient Details  Name: Sharon Odom MRN: [id number] Date of Birth: [birth year]  Today's Date: 05/03/2022 OT Individual Time: 1300-1330 OT Individual Time Calculation (min): 30 min    Short Term Goals: Week 5:  OT Short Term Goal 1 (Week 5): STG=LTG d/t ELOS with LT's modified to reflect current status and change in dispo  Skilled Therapeutic Interventions/Progress Updates:   Pt up in pwc upon OT arrival self feeding lunch meal. ATP had worked with pt and PT to establish improved positioning and added new settings for "eating" position and written instructions provided. OT reviewed with pt and position of pwc was set in "eating" mode. OT applied SaeboStim One (same as all previous parameters with no skin issues or negative reaction for 15 min application during function) on neck mm on L to add NMRE trng while during self feeding. Pt required added neck roll to nowly modified R side of head rest to add support with + results to upright head during self feeding. Pt able to teach back new pwc settings with written information and min cues. Pt able to self feed with set up and S this visit. Left pt up in [facility] with chair exit alarm set, nurse call button on lap and all needs on tray table.    Therapy Documentation Precautions:  Precautions Precautions: Fall Required Braces or Orthoses: Splint/Cast Splint/Cast: L resting hand splint Restrictions Weight Bearing Restrictions: No    Therapy/Group: Individual Therapy  Vicenta Dunning 05/03/2022, 7:27 AM

## 2022-05-03 NOTE — Progress Notes (Signed)
Patient ID: Sharon Odom, female   DOB: November 11, 1970, 51 y.o.   MRN: [id number]  Spoke with [facility] to ask if have heard anything from [facility] regarding auth. She has not she plans to call. 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 Discussed the option would be to pay privately which could run 15,000-18,000 per month or to go home and hired assist while he works. Husband informed worker the is going [employer] and his sister is coming for the weekend to be here. Informed him once hear will let him know. Pt informed also

## 2022-05-03 NOTE — Progress Notes (Signed)
Palliative:  Following chart. Disposition is now the main issue. Goals are clear - DNR and MOST completed previously. I have previously discussed hospice services and support with Kellye and Onalee Hua and they seemed open to these services. This could be of great assistance to them if she returns home. If not then palliative services would be helpful at any location.   No charge  Yong Channel, NP Palliative Medicine Team Pager [phone] (Please see [email or web address] for schedule) Team Phone [phone]

## 2022-05-03 NOTE — Progress Notes (Signed)
Speech Language Pathology Daily Session Note  Patient Details  Name: Sharon Odom MRN: [id number] Date of Birth: [birth year]  Today's Date: 05/03/2022 SLP Individual Time: 1400-1430 SLP Individual Time Calculation (min): 30 min  Short Term Goals: Week 4: SLP Short Term Goal 1 (Week 4): STG=LTG due to awaiting SNF  Skilled Therapeutic Interventions: Skilled ST treatment focused on communication goals and reinforcement of safe swallowing precautions and positioning considerations. Pt was consuming lunch on arrival consisting of regular textures and thin liquid via straw. Pt was observed to elevate head AFTER the swallow. SLP facilitated education regarding attempts to elevate head during and after the swallow to optimize positioning in order to minimize aspiration risk. Pt was able to implement during 50% of occasions but still limited by fatigue and poor head control/support. Pt consumed thin liquids without overt s/sx of aspiration, however an intermittent wet vocal quality was observed requiring cues to clear throat or cough which appeared effective in improving vocal quality.   SLP facilitated additional training on over-articulation strategy and increasing vocal intensity with max fading to mod A multimodal cueing. When this was consistently implemented, pt was perceived as >75% intelligible at the short phrase level. Without implementation of speech strategies, speech was perceived as ~25-50% intelligible.   Patient was left in power wheelchair with immediate needs within reach at end of session. Continue per current plan of care.      Pain Pain Assessment Pain Scale: 0-10 Pain Score: 4   Therapy/Group: Individual Therapy  Tamala Ser 05/03/2022, 4:44 PM

## 2022-05-03 NOTE — Progress Notes (Addendum)
PROGRESS NOTE   Subjective/Complaints:  Cervical dystonia , head turned to RIght   Pain and spasms controlled per pt   Did not receive provigil yesterday   Has Foley cath  ROS: Limited due to cognitive  Objective:   No results found. No results for input(s): "WBC", "HGB", "HCT", "PLT" in the last 72 hours.     No results for input(s): "NA", "K", "CL", "CO2", "GLUCOSE", "BUN", "CREATININE", "CALCIUM" in the last 72 hours.      Intake/Output Summary (Last 24 hours) at 05/03/2022 0854 Last data filed at 05/02/2022 1845 Gross per 24 hour  Intake 1260 ml  Output 1250 ml  Net 10 ml      Pressure Injury 03/31/22 Sacrum Medial Deep Tissue Pressure Injury - Purple or maroon localized area of discolored intact skin or blood-filled blister due to damage of underlying soft tissue from pressure and/or shear. (Active)  03/31/22 0815  Location: Sacrum  Location Orientation: Medial  Staging: Deep Tissue Pressure Injury - Purple or maroon localized area of discolored intact skin or blood-filled blister due to damage of underlying soft tissue from pressure and/or shear.  Wound Description (Comments):   Present on Admission: Yes     Pressure Injury 04/03/22 Thigh Left;Posterior;Proximal Stage 2 -  Partial thickness loss of dermis presenting as a shallow open injury with a red, pink wound bed without slough. red pink (Active)  04/03/22 1751  Location: Thigh  Location Orientation: Left;Posterior;Proximal  Staging: Stage 2 -  Partial thickness loss of dermis presenting as a shallow open injury with a red, pink wound bed without slough.  Wound Description (Comments): red pink  Present on Admission: Yes    Physical Exam: Vital Signs Blood pressure (!) 91/54, pulse 61, temperature 97.7 F (36.5 C), temperature source Axillary, resp. rate 16, height 5\' 9"  (1.753 m), weight 55.2 kg, SpO2 99 %.   General: awake, alert,  appropriate, sitting upbed- head leaning to R NAD  General: No acute distress Mood and affect are appropriate Heart: Regular rate and rhythm no rubs murmurs or extra sounds Lungs: Clear to auscultation, breathing unlabored, no rales or wheezes Abdomen: Positive bowel sounds, soft nontender to palpation, nondistended Extremities: No clubbing, cyanosis, or edema Skin: No evidence of breakdown, no evidence of rash Neurologic: hypophonic , pocket talker not functioning     Neurological:     Mental Status: She is fatigued     Comments: MAS 2 to 3 RLE, MAS 1+ LLE  Cervial dystonia with torticollis and lateral collis , head turned to the right, hypertonicity Right trap and left SCM Motor 5/5 RUE 3+ LUE RLE- HF/KE/DF and PF 4-/5-  LLE- HF 1 to 2-/5; PF/DF 1/5 Tone clonus 4 beats RIght ankle absent clonus on left   Assessment/Plan: 1. Functional deficits which require 3+ hours per day of interdisciplinary therapy in a comprehensive inpatient rehab setting. Physiatrist is providing close team supervision and 24 hour management of active medical problems listed below. Physiatrist and rehab team continue to assess barriers to discharge/monitor patient progress toward functional and medical goals  Care Tool:  Bathing    Body parts bathed by patient: Right arm, Left arm, Chest, Abdomen, Face  Body parts bathed by helper: Right lower leg, Left lower leg, Front perineal area, Buttocks, Right upper leg, Left upper leg     Bathing assist Assist Level: Maximal Assistance - Patient 24 - 49%     Upper Body Dressing/Undressing Upper body dressing   What is the patient wearing?: Pull over shirt    Upper body assist Assist Level: Moderate Assistance - Patient 50 - 74%    Lower Body Dressing/Undressing Lower body dressing      What is the patient wearing?: Underwear/pull up, Pants     Lower body assist Assist for lower body dressing: Maximal Assistance - Patient 25 - 49%      Toileting Toileting Toileting Activity did not occur (Clothing management and hygiene only): N/A (no void or bm)  Toileting assist Assist for toileting: Moderate Assistance - Patient 50 - 74%     Transfers Chair/bed transfer  Transfers assist     Chair/bed transfer assist level: 2 Helpers     Locomotion Ambulation   Ambulation assist   Ambulation activity did not occur: Safety/medical concerns (unable to perform due to pain, weakness and decreased activity tolerance)          Walk 10 feet activity   Assist  Walk 10 feet activity did not occur: Safety/medical concerns (unable to perform due to pain, weakness and decreased activity tolerance)        Walk 50 feet activity   Assist Walk 50 feet with 2 turns activity did not occur: Safety/medical concerns (unable to perform due to pain, weakness and decreased activity tolerance)         Walk 150 feet activity   Assist Walk 150 feet activity did not occur: Safety/medical concerns (unable to perform due to pain, weakness and decreased activity tolerance)         Walk 10 feet on uneven surface  activity   Assist Walk 10 feet on uneven surfaces activity did not occur: Safety/medical concerns (unable to perform due to pain, weakness and decreased activity tolerance)         Wheelchair     Assist Is the patient using a wheelchair?: Yes Type of Wheelchair: Power    Wheelchair assist level: Supervision/Verbal cueing Max wheelchair distance: 300 ft    Wheelchair 50 feet with 2 turns activity    Assist        Assist Level: Supervision/Verbal cueing   Wheelchair 150 feet activity     Assist      Assist Level: Supervision/Verbal cueing   Blood pressure (!) 91/54, pulse 61, temperature 97.7 F (36.5 C), temperature source Axillary, resp. rate 16, height 5\' 9"  (1.753 m), weight 55.2 kg, SpO2 99 %.  Medical Problem List and Plan: 1. Functional deficits due to secondary progressive  multiple sclerosis and debility from prolonged admission on acute floor for encephalopathy             -patient may  shower             -ELOS/Goals: min A to supervision- 22 days  Plan for d/c to SNF   -Continue CIR therapies including PT, OT, and SLP  2.  Impaired mobility: continue Lovenox             -antiplatelet therapy: none  - continue TEDs for BL LE swelling  Dopplers reviewed and are negative for clots.  3. Diffuse pain: not receiving Tylenol regularly, will schedule TID. Discussed alternative pain medications but she is afraid about how these could interact  with her current medications. Nucynta 75mg  , was only taking twice a day, encouraged 4 x per day may go up to 100mg  if needed Will d/c tizanidine - tone is not severe and this may be contributing to fatigue    4. Depression: continue Wellbutrin XL 300 mg daily             -amantadine 100 mg TID- per Dr Epimenio Foot 5. Neuropsych/cognition: This patient is capable of making decisions on her own behalf. 6. Skin/Wound Care: Routine skin care checks  11/7- has new DTI on coccyx, worsening now stage II on posterior R thigh and small DTI R heel- added prevalon boots in bed; pt will NOT use air mattress- changing to regular bed due to pt insistence; and foam dressing and turn q2 hours.   11/9- pt doesn't like prevalon boots, but educated on the importance 7. Fluids/Electrolytes/Nutrition: Routine Is and Os and follow-up chemistries             -continue Vitamin C, D3, B12 and fiber supplements 8: MS with spasticity: continue dalfampridine BID and [place]              -Baclofen 20 mg twice daily, 40 mg every morning             -Valium 5 mg every 8 hours             -Zanaflex 4 mg d/ced   due to cognition/sedation - will change out /refill pump Thursday as well as increase dosing of ITB pump  11/8- spasticity slightly more than normal, however increasing ITB pump tomorrow, so will not increase spasticity PO meds  11/9- increasing ITB pump  today- and refilling it.   11/10- pump up to 540 mcg/day  11/14- wait on changing dose- discussed with pt x2 about if we increase ITB PUMP, will help spasticity pain, however will make her functionally weaker. Will d/w pt and husband again as well  11/20- will reduce dose tomorrow to prior dose.  11/23- reduced dose to 485 mcg and refill date 09/09/22- on IV solumedrol x 5 days- 11/24- finish IV Solumedrol on 11/25   9: GERD: restart Zantac 10: Neurogenic bladder with indwelling Foley; s/p UTI treatment with Zosyn 11: Bowel program: Nutrisource fiber, psyllium caps, Colace, MiraLax, Senekot-S given daily         LBM on 12/5 12: Dysphagia: ? resolved; now on regular diet; SLP eval  11/27- see below 13: Seasonal allergies off home meds: monitor 14: Tooth abscess: missed follow-up appointment; completed Flagyl 15: Hypokalemia: on K+ supplement QOD; follow-up BMP tomorrow- likely due to previous Lasix dose for Edema.    11/8- K+ 4.1  11/14- K+ 4.3  Recheck tomorrow  11/27- K+ 4.2  12/4 K+ 4.1 today, stable 16. Insomnia  11/9- will add Trazodone 50 mg QHS and can titrate up as required  11/10- increase to 150 mg QHS  11/14- will decrease to 100 mg QHS since having some confusion  11/17- having weird dreams but doesn't want to stop Trazodone- explained it's from trazodone.  17. - UTI with VRE: 1x dose of Monural given on 11/28, precautions started  18. Confusion/ progressive weakness- due to high dose IV steroids  12/2 some improvement after steroids  -polypharmacy also contributing, now off tizanidine, hydrocodone Still on baclofen and nucynta which should be less sedating  Will d/c amantadine and trial  modafanil for fatigue 19. Dysphagia  11/27- will order SLP to evaluate Swallowing- they've made her D2 diet-  and doing MBS tomorrow to see about liquid thickness.  20. Mild swelling of multiple extremities: vascular ultrasound reviewed and negative.  21.  Cervical dystonia- may benefit  from botulinum toxin injections 50U to Right trap and 50 U to Left SCM    LOS: 30 days A FACE TO FACE EVALUATION WAS PERFORMED  Erick Colace 05/03/2022, 8:54 AM

## 2022-05-03 NOTE — Progress Notes (Signed)
Physical Therapy Session Note  Patient Details  Name: Sharon Odom MRN: [id number] Date of Birth: 1970-10-09  Today's Date: 05/03/2022 PT Individual Time: 2725-3664 PT Individual Time Calculation (min): 72 min   Short Term Goals: Week 4:  PT Short Term Goal 1 (Week 4): STG=LTG due to extended LOS  Skilled Therapeutic Interventions/Progress Updates:      Therapy Documentation Precautions:  Precautions Precautions: Fall Required Braces or Orthoses: Splint/Cast Splint/Cast: L resting hand splint Restrictions Weight Bearing Restrictions: No  Pt agreeable to w/c re-evaluation. ATP, Jason, from [facility] presents to modify head rest as pt presents with significant right cervical lateral flexion. ATP modified head rest and re-programmed w/c features to automatically position patient in position for driving, eating, and pressure relief. Pt required max A with bed mobility and squat pivot transfer to chair following re-assemble. PT made handout for patient, nursing and therapy staff with directions on w/c control. Pt left seated in PWC at bedside with chair alarm on and all needs within reach.    Therapy/Group: Individual Therapy  [name] [name] PT, DPT  05/03/2022, 7:33 AM

## 2022-05-04 ENCOUNTER — Telehealth: Payer: BLUE CROSS/BLUE SHIELD | Admitting: Physical Medicine and Rehabilitation

## 2022-05-04 MED ORDER — PANTOPRAZOLE SODIUM 40 MG PO TBEC: 40.0000 mg | DELAYED_RELEASE_TABLET | Freq: Two times a day (BID) | ORAL | Status: AC

## 2022-05-04 MED ORDER — TAPENTADOL HCL 75 MG PO TABS: 75.0000 mg | ORAL_TABLET | Freq: Three times a day (TID) | ORAL | 0 refills | Status: AC

## 2022-05-04 MED ORDER — CYANOCOBALAMIN 2500 MCG PO TABS: 2500.0000 ug | ORAL_TABLET | Freq: Every day | ORAL | Status: AC

## 2022-05-04 MED ORDER — POLYETHYLENE GLYCOL 3350 17 G PO PACK: 17.0000 g | PACK | Freq: Every day | ORAL | 0 refills | Status: AC

## 2022-05-04 MED ORDER — DALFAMPRIDINE ER 10 MG PO TB12: 10.0000 mg | ORAL_TABLET | Freq: Two times a day (BID) | ORAL | Status: AC

## 2022-05-04 MED ORDER — ACETAMINOPHEN 500 MG PO TABS: 1000.0000 mg | ORAL_TABLET | Freq: Three times a day (TID) | ORAL | 0 refills | Status: AC

## 2022-05-04 MED ORDER — DOCUSATE SODIUM 100 MG PO CAPS: 100.0000 mg | ORAL_CAPSULE | Freq: Every day | ORAL | 0 refills | Status: AC

## 2022-05-04 MED ORDER — BIOTENE DRY MOUTH MT LIQD: 15.0000 mL | OROMUCOSAL | Status: AC | PRN

## 2022-05-04 MED ORDER — METAMUCIL 0.36 G PO CAPS: 5.0000 | ORAL_CAPSULE | Freq: Every day | ORAL | Status: AC

## 2022-05-04 MED ORDER — POLYVINYL ALCOHOL 1.4 % OP SOLN: 1.0000 [drp] | Freq: Three times a day (TID) | OPHTHALMIC | 0 refills | Status: AC | PRN

## 2022-05-04 MED ORDER — RISAQUAD PO CAPS: 1.0000 | ORAL_CAPSULE | Freq: Three times a day (TID) | ORAL | Status: AC

## 2022-05-04 MED ORDER — MELOXICAM 15 MG PO TABS: 15.0000 mg | ORAL_TABLET | Freq: Every day | ORAL | Status: AC

## 2022-05-04 MED ORDER — BACLOFEN 20 MG PO TABS: 20.0000 mg | ORAL_TABLET | Freq: Three times a day (TID) | ORAL | 0 refills | Status: AC

## 2022-05-04 MED ORDER — SENNOSIDES-DOCUSATE SODIUM 8.6-50 MG PO TABS: 2.0000 | ORAL_TABLET | Freq: Every day | ORAL | Status: AC

## 2022-05-04 MED ORDER — MODAFINIL 100 MG PO TABS: 100.0000 mg | ORAL_TABLET | Freq: Every day | ORAL | Status: AC

## 2022-05-04 MED ORDER — TROSPIUM CHLORIDE 20 MG PO TABS: 20.0000 mg | ORAL_TABLET | Freq: Two times a day (BID) | ORAL | Status: AC

## 2022-05-04 MED ORDER — DIAZEPAM 5 MG PO TABS: 5.0000 mg | ORAL_TABLET | Freq: Three times a day (TID) | ORAL | 0 refills | Status: AC

## 2022-05-04 MED ORDER — TRAZODONE HCL 100 MG PO TABS: 100.0000 mg | ORAL_TABLET | Freq: Every day | ORAL | Status: AC

## 2022-05-04 MED ORDER — NUTRISOURCE FIBER PO PACK: 1.0000 | PACK | Freq: Every day | ORAL | Status: AC

## 2022-05-04 NOTE — Progress Notes (Addendum)
Patient ID: Sharon Odom, female   DOB: 1970-12-14, 51 y.o.   MRN: [id number]  Spoke with [facility] still awaiting auth decision from [facility] regarding approval. Made pt aware of this  12:56 PM Just received insurance auth for pt to go to [facility]. Have let pt and husband along with sister in-law know and will plan for [facility]AR transport at 3:00 pm. Contacted [facility] to ask jason to come and transport her power chair to the facility had to leave [facility]

## 2022-05-04 NOTE — Progress Notes (Signed)
Inpatient Rehabilitation Care Coordinator Discharge Note   Patient Details  Name: Sharon Odom MRN: [id number] Date of Birth: Sep 07, 1970   Discharge location: GOING TO [facility]SNF FOR MORE REHAB  Length of Stay:  31 DAYS  Discharge activity level: MAX ASSIST  Home/community participation: HOMEBOUND  Patient response EY:EMVVKP Literacy - How often do you need to have someone help you when you read instructions, pamphlets, or other written material from your doctor or pharmacy?: Never  Patient response QA:ESLPNP Isolation - How often do you feel lonely or isolated from those around you?: Rarely  Services provided included: MD, RD, PT, OT, SLP, RN, CM, TR, Pharmacy, Neuropsych, SW  Financial Services:  Field seismologist Utilized: [employer]  Choices offered to/list presented to: PT AND HUSBAND  Follow-up services arranged:  Other (Comment) (SNF)           Patient response to transportation need: Is the patient able to respond to transportation needs?: Yes In the past 12 months, has lack of transportation kept you from medical appointments or from getting medications?: No In the past 12 months, has lack of transportation kept you from meetings, work, or from getting things needed for daily living?: No    Comments (or additional information):PT AND HUSBAND FELT NEEDS MORE REHAB AND IS AWARE WILL TO COME WITH A PLAN FOR HOME DUE TO INSURANCE WILL ONLY PAY FOR SO LONG.   Patient/Family verbalized understanding of follow-up arrangements:  Yes  Individual responsible for coordination of the follow-up plan: [name]-HUSBAND [phone]  Confirmed correct DME delivered: Lucy Chris 05/04/2022    Deidrick Rainey, Lemar Livings

## 2022-05-04 NOTE — Plan of Care (Signed)
  Problem: Consults Goal: RH SPINAL CORD INJURY PATIENT EDUCATION Description:  See Patient Education module for education specifics.  Outcome: Progressing   Problem: SCI BOWEL ELIMINATION Goal: RH STG MANAGE BOWEL WITH ASSISTANCE Description: STG Manage Bowel with  mod I Assistance. Outcome: Progressing Goal: RH STG SCI MANAGE BOWEL WITH MEDICATION WITH ASSISTANCE Description: STG SCI Manage bowel with medication with mod I assistance. Outcome: Progressing   Problem: SCI BLADDER ELIMINATION Goal: RH STG MANAGE BLADDER WITH EQUIPMENT WITH ASSISTANCE Description: STG Manage Bladder With Equipment With min Assistance Outcome: Progressing   Problem: RH SKIN INTEGRITY Goal: RH STG SKIN FREE OF INFECTION/BREAKDOWN Description: With min assist Outcome: Progressing Goal: RH STG MAINTAIN SKIN INTEGRITY WITH ASSISTANCE Description: STG Maintain Skin Integrity With min  Assistance. Outcome: Progressing   Problem: RH SAFETY Goal: RH STG ADHERE TO SAFETY PRECAUTIONS W/ASSISTANCE/DEVICE Description: STG Adhere to Safety Precautions With cues Assistance/Device. Outcome: Progressing   Problem: RH PAIN MANAGEMENT Goal: RH STG PAIN MANAGED AT OR BELOW PT'S PAIN GOAL Description: < 4 with prns Outcome: Progressing   Problem: RH KNOWLEDGE DEFICIT SCI Goal: RH STG INCREASE KNOWLEDGE OF SELF CARE AFTER SCI Description: Patient and spouse will be able to manage care at discharge using medicationseducational handouts independently Outcome: Progressing

## 2022-05-04 NOTE — Progress Notes (Signed)
PROGRESS NOTE   Subjective/Complaints:  Appreciate palliative note Discussed possible botulinum toxin injection to the neck with husband, discussed possibility of worsening swallow as side effect , he would like to discuss this with pt   Has Foley cath  Pain controlled per pt report  Does not recall any improvement in fatigue yesterday   ROS: Limited due to cognitive  Objective:   No results found. No results for input(s): "WBC", "HGB", "HCT", "PLT" in the last 72 hours.     No results for input(s): "NA", "K", "CL", "CO2", "GLUCOSE", "BUN", "CREATININE", "CALCIUM" in the last 72 hours.      Intake/Output Summary (Last 24 hours) at 05/04/2022 0636 Last data filed at 05/03/2022 1739 Gross per 24 hour  Intake 480 ml  Output 600 ml  Net -120 ml      Pressure Injury 03/31/22 Sacrum Medial Deep Tissue Pressure Injury - Purple or maroon localized area of discolored intact skin or blood-filled blister due to damage of underlying soft tissue from pressure and/or shear. (Active)  03/31/22 0815  Location: Sacrum  Location Orientation: Medial  Staging: Deep Tissue Pressure Injury - Purple or maroon localized area of discolored intact skin or blood-filled blister due to damage of underlying soft tissue from pressure and/or shear.  Wound Description (Comments):   Present on Admission: Yes     Pressure Injury 04/03/22 Thigh Left;Posterior;Proximal Stage 2 -  Partial thickness loss of dermis presenting as a shallow open injury with a red, pink wound bed without slough. red pink (Active)  04/03/22 1751  Location: Thigh  Location Orientation: Left;Posterior;Proximal  Staging: Stage 2 -  Partial thickness loss of dermis presenting as a shallow open injury with a red, pink wound bed without slough.  Wound Description (Comments): red pink  Present on Admission: Yes    Physical Exam: Vital Signs Blood pressure (!) 94/57, pulse  62, temperature 98.2 F (36.8 C), temperature source Oral, resp. rate 16, height 5\' 9"  (1.753 m), weight 54.1 kg, SpO2 98 %.   General: awake, alert, appropriate, sitting upbed- head leaning to R NAD  General: No acute distress Mood and affect are appropriate Heart: Regular rate and rhythm no rubs murmurs or extra sounds Lungs: Clear to auscultation, breathing unlabored, no rales or wheezes Abdomen: Positive bowel sounds, soft nontender to palpation, nondistended Extremities: No clubbing, cyanosis, or edema Skin: No evidence of breakdown, no evidence of rash Neurologic: hypophonic , pocket talker not functioning     Neurological:     Mental Status: She is fatigued     Comments: MAS 2 to 3 RLE, MAS 1+ LLE  Cervial dystonia with torticollis and lateral collis , head turned to the right, hypertonicity Right trap and left SCM Motor 5/5 RUE 3+ LUE RLE- HF/KE/DF and PF 4-/5-  LLE- HF 1 to 2-/5; PF/DF 1/5 Tone clonus 4 beats RIght ankle absent clonus on left   Assessment/Plan: 1. Functional deficits which require 3+ hours per day of interdisciplinary therapy in a comprehensive inpatient rehab setting. Physiatrist is providing close team supervision and 24 hour management of active medical problems listed below. Physiatrist and rehab team continue to assess barriers to discharge/monitor patient progress toward  functional and medical goals  Care Tool:  Bathing    Body parts bathed by patient: Right arm, Left arm, Chest, Abdomen, Face   Body parts bathed by helper: Right lower leg, Left lower leg, Front perineal area, Buttocks, Right upper leg, Left upper leg     Bathing assist Assist Level: Maximal Assistance - Patient 24 - 49%     Upper Body Dressing/Undressing Upper body dressing   What is the patient wearing?: Pull over shirt    Upper body assist Assist Level: Moderate Assistance - Patient 50 - 74%    Lower Body Dressing/Undressing Lower body dressing      What is the  patient wearing?: Underwear/pull up, Pants     Lower body assist Assist for lower body dressing: Maximal Assistance - Patient 25 - 49%     Toileting Toileting Toileting Activity did not occur (Clothing management and hygiene only): N/A (no void or bm)  Toileting assist Assist for toileting: Moderate Assistance - Patient 50 - 74%     Transfers Chair/bed transfer  Transfers assist     Chair/bed transfer assist level: 2 Helpers     Locomotion Ambulation   Ambulation assist   Ambulation activity did not occur: Safety/medical concerns (unable to perform due to pain, weakness and decreased activity tolerance)          Walk 10 feet activity   Assist  Walk 10 feet activity did not occur: Safety/medical concerns (unable to perform due to pain, weakness and decreased activity tolerance)        Walk 50 feet activity   Assist Walk 50 feet with 2 turns activity did not occur: Safety/medical concerns (unable to perform due to pain, weakness and decreased activity tolerance)         Walk 150 feet activity   Assist Walk 150 feet activity did not occur: Safety/medical concerns (unable to perform due to pain, weakness and decreased activity tolerance)         Walk 10 feet on uneven surface  activity   Assist Walk 10 feet on uneven surfaces activity did not occur: Safety/medical concerns (unable to perform due to pain, weakness and decreased activity tolerance)         Wheelchair     Assist Is the patient using a wheelchair?: Yes Type of Wheelchair: Power    Wheelchair assist level: Supervision/Verbal cueing Max wheelchair distance: 300 ft    Wheelchair 50 feet with 2 turns activity    Assist        Assist Level: Supervision/Verbal cueing   Wheelchair 150 feet activity     Assist      Assist Level: Supervision/Verbal cueing   Blood pressure (!) 94/57, pulse 62, temperature 98.2 F (36.8 C), temperature source Oral, resp. rate 16,  height 5\' 9"  (1.753 m), weight 54.1 kg, SpO2 98 %.  Medical Problem List and Plan: 1. Functional deficits due to secondary progressive multiple sclerosis and debility from prolonged admission on acute floor for encephalopathy             -patient may  shower             -ELOS/Goals: min A to supervision- 22 days  Plan for d/c to SNF   -Continue CIR therapies including PT, OT, and SLP  2.  Impaired mobility: continue Lovenox             -antiplatelet therapy: none  - continue TEDs for BL LE swelling  Dopplers reviewed and are negative  for clots.  3. Diffuse pain: not receiving Tylenol regularly, will schedule TID. Discussed alternative pain medications but she is afraid about how these could interact with her current medications. Nucynta 75mg  , was only taking twice a day, encouraged 4 x per day may go up to 100mg  if needed Will d/c tizanidine - tone is not severe and this may be contributing to fatigue    4. Depression: continue Wellbutrin XL 300 mg daily             -amantadine 100 mg TID- per Dr 5. Neuropsych/cognition: This patient is capable of making decisions on her own behalf. 6. Skin/Wound Care: Routine skin care checks  11/7- has new DTI on coccyx, worsening now stage II on posterior R thigh and small DTI R heel- added prevalon boots in bed; pt will NOT use air mattress- changing to regular bed due to pt insistence; and foam dressing and turn q2 hours.   11/9- pt doesn't like prevalon boots, but educated on the importance 7. Fluids/Electrolytes/Nutrition: Routine Is and Os and follow-up chemistries             -continue Vitamin C, D3, B12 and fiber supplements 8: MS with spasticity: continue dalfampridine BID and [place]              -Baclofen 20 mg twice daily, 40 mg every morning             -Valium 5 mg every 8 hours             -Zanaflex 4 mg d/ced   due to cognition/sedation - will change out /refill pump Thursday as well as increase dosing of ITB pump  11/8-  spasticity slightly more than normal, however increasing ITB pump tomorrow, so will not increase spasticity PO meds  11/9- increasing ITB pump today- and refilling it.   11/10- pump up to 540 mcg/day  11/14- wait on changing dose- discussed with pt x2 about if we increase ITB PUMP, will help spasticity pain, however will make her functionally weaker. Will d/w pt and husband again as well  11/20- will reduce dose tomorrow to prior dose.  11/23- reduced dose to 485 mcg and refill date 09/09/22- on IV solumedrol x 5 days- 11/24- finish IV Solumedrol on 11/25   9: GERD: restart Zantac 10: Neurogenic bladder with indwelling Foley; s/p UTI treatment with Zosyn 11: Bowel program: Nutrisource fiber, psyllium caps, Colace, MiraLax, Senekot-S given daily         LBM on 12/5 12: Dysphagia: ? resolved; now on regular diet; SLP eval  11/27- see below 13: Seasonal allergies off home meds: monitor 14: Tooth abscess: missed follow-up appointment; completed Flagyl 15: Hypokalemia: on K+ supplement QOD; follow-up BMP tomorrow- likely due to previous Lasix dose for Edema.    11/8- K+ 4.1  11/14- K+ 4.3  Recheck tomorrow  11/27- K+ 4.2  12/4 K+ 4.1 today, stable 16. Insomnia  11/9- will add Trazodone 50 mg QHS and can titrate up as required  11/10- increase to 150 mg QHS  11/14- will decrease to 100 mg QHS since having some confusion  11/17- having weird dreams but doesn't want to stop Trazodone- explained it's from trazodone.  17. - UTI with VRE: 1x dose of Monural given on 11/28, precautions started  18. Confusion/ progressive weakness- due to high dose IV steroids  12/2 some improvement after steroids  -polypharmacy also contributing, now off tizanidine, hydrocodone Still on baclofen and nucynta which should be less  sedating  Will d/c amantadine and trial  modafanil for fatigue- increase to 200mg   19. Dysphagia  11/27- will order SLP to evaluate Swallowing- they've made her D2 diet- and doing MBS  tomorrow to see about liquid thickness.  20. Mild swelling of multiple extremities: vascular ultrasound reviewed and negative.  21.  Cervical dystonia- may benefit from botulinum toxin injections 50U to Right trap and 50 U to Left SCM- potential risk is worsening of swallow from SCM injection    LOS: 31 days A FACE TO FACE EVALUATION WAS PERFORMED  Erick Colace 05/04/2022, 6:36 AM

## 2022-05-04 NOTE — Plan of Care (Signed)
  Problem: RH Expression Communication Goal: LTG Patient will increase speech intelligibility (SLP) Description: LTG: Patient will increase speech intelligibility at word/phrase/conversation level with cues, % of the time (SLP) Outcome: Completed/Met   Problem: RH Expression Communication Goal: LTG Patient will express needs/wants via multi-modal(SLP) Description: LTG:  Patient will express needs/wants via multi-modal communication (gestures/written, etc) with cues (SLP) Outcome: Completed/Met

## 2022-05-04 NOTE — Progress Notes (Signed)
INPATIENT REHABILITATION DISCHARGE NOTE   Discharge instructions by: Wendi Maya, PA  Verbalized understanding: yes  Skin care/Wound care healing? yes  Pain: medicated prior to d/c  IV's: none  Tubes/Drains: none  O2: none  Safety instructions: reviewed with pt and family  Patient belongings: sent with pt  Discharged to: [facility]   Discharged via: [facility]  Notes: done  Marylu Lund, Charity fundraiser

## 2022-05-04 NOTE — Progress Notes (Signed)
Speech Language Pathology Discharge Summary  Patient Details  Name: Sharon Odom MRN: [id number] Date of Birth: 21-Dec-1970  Date of Discharge from [place] service:May 04, 2022  Today's Date: 05/04/2022 SLP Individual Time: 1914-7829 SLP Individual Time Calculation (min): 29 min  Skilled Therapeutic Interventions:  Skilled ST treatment focused on speech goals. Pt was in bed on arrival and accompanied by family. A bed became available to today and pt plans to discharge to SNF later this afternoon. Pt required max fading to mod A verbal cues to implement speech strategies at the short phrase level in order to achieve 75% speech intelligibility. With implementation of strategies, pt was perceived as 25-50% intelligible in a very quiet environment to adequately understand pt. Pt required total A for peri care from bed level following use of bed pan. After pt was cleaned up, she stated she still felt like she needed to go. Pt was placed back on bedpan and family was asked to press call button when pt was done, as treatment time had concluded at his point. Patient was left in bed with alarm activated and immediate needs within reach at end of session.  Patient has met 6 of 6 long term goals.  Patient to discharge at overall Mod;Max level.  Reasons goals not met: noneNone   Clinical Impression/Discharge Summary: Patient made overall slow progress this admission, however was able to meet 6 of 6 long-term goals this admission with primary focus eventually emphasizing verbal expression goals via speech intelligibility strategies, environmental modifications, and other forms of multimodal communication. Pt is currently implementing speech intelligibility strategies at the word and short-phrase level with mod-to-max A multimodal cues. Pt also continues to use an amplifier as needed to support ability to express needs/thoughts/wishes and engage in social interaction. Per discussion with pt and pt's husband,  cognition now appears to be consistent with baseline. Cognitive goals were met at the [place] A level; do not anticipate pt being able to achieve a solid Sup or Mod I level given ongoing functional decline in the setting of MS exacerbation with minimal functional change noted during this admission. Pt has made the informed decision to consume regular textures and thin liquids despite MBS results revealing significant dysphagia with SLP recommendations for pureed diet with thin liquids. Pt and family have been thoroughly educated and accept possible ramifications of consuming regular textures and thin liquids. SLP has worked with pt on positioning and safe swallowing precautions in light of her decision to remain on regular textures/thin liquids in effort to mitigate symptoms and reduce risk for airway invasion. Patient and family education is complete and patient to discharge at overall mod-to-max A level for speech goals. Pt will be discharging to SNF where she is recommended to receive ongoing SLP services to maximize functional communication and swallowing safety as indicated.  Care Partner:  Caregiver Able to Provide Assistance: No     Recommendation:  Skilled Nursing facility  Rationale for SLP Follow Up: Maximize functional communication;Maximize swallowing safety   Equipment: Pt utilizing an amplifier for speech   Reasons for discharge: Treatment goals met;Other (comment) (discharge to SNF)   Patient/Family Agrees with Progress Made and Goals Achieved: Yes    Sharon Odom 05/04/2022, 4:58 PM

## 2022-05-04 NOTE — Progress Notes (Signed)
Occupational Therapy Session Note  Patient Details  Name: Sharon Odom MRN: [id number] Date of Birth: Feb 07, 1971  Today's Date: 05/04/2022 OT Individual Time: 3958-4417 OT Individual Time Calculation (min): 30 min    Short Term Goals: Week 5:  OT Short Term Goal 1 (Week 5): STG=LTG d/t ELOS with LT's modified to reflect current status and change in dispo  Skilled Therapeutic Interventions/Progress Updates:    Upon OT arrival, pt seated in [place] with nursing present reading a card to pt. Pt agreeable to OT session. Treatment intervention with a focus on functional mobility, activity tolerance, positioning, and dynamic sitting balance. Pt straightens her UB clothing with Min A to manage zipper and transports herself to dayroom gym via w/c with SBA. Pt demonstrates good upright posture while transporting herself to gym. While seated in [place], pt retrieves bean bags from R side of body using the  R UE, crosses midline, and places into basket located on L lower quadrant of pt. Pt able to retrieve and place total of 10 bean bags with increased time and effort. Pt requires verbal cues to initiate and correct posture during task. Pt noted to demonstrate fatigue after ~6 bean bags demoing difficulty returning to upright positing and requires Min A to assist with posture. Pt transports herself back to her room via w/c and SBA and was left in PWC at end of session with all needs met and safety measures in place.   Therapy Documentation Precautions:  Precautions Precautions: Fall Required Braces or Orthoses: Splint/Cast Splint/Cast: L resting hand splint Restrictions Weight Bearing Restrictions: No  Therapy/Group: Individual Therapy  Marvetta Gibbons 05/04/2022, 12:19 PM

## 2022-05-04 NOTE — Progress Notes (Signed)
Physical Therapy Session Note  Patient Details  Name: Sharon Odom MRN: [id number] Date of Birth: 15-Nov-1970  Today's Date: 05/04/2022 PT Individual Time: 0272-5366 PT Individual Time Calculation (min): 32 min   Short Term Goals: Week 1:  PT Short Term Goal 1 (Week 1): STG=LTG due ELOS PT Short Term Goal 1 - Progress (Week 1): Progressing toward goal Week 2:  PT Short Term Goal 1 (Week 2): d/c date >7-10 days, STG=LTG due to ELOS PT Short Term Goal 1 - Progress (Week 2): Other (comment) (Revised and new long term goals created due to functional mobility decline) Week 3:  PT Short Term Goal 1 (Week 3): STG=LTG due ELOS and functional decline PT Short Term Goal 1 - Progress (Week 3): Progressing toward goal  Skilled Therapeutic Interventions/Progress Updates:  Patient supine in bed on entrance to room. Patient alert and agreeable to PT session. Just completed dressing with assist from nursing students.   Patient with no pain complaint at start of session.  Therapeutic Activity: Bed Mobility: Pt performed supine --> sit EOB with MaxA. VC/ tc required for technique in assist. Sits EOB with fair balance and MinA while setting up w/c at bedside for transfer.  Transfers: Pt performed squat pivot transfer to R from bed to power chair with MaxA. Pt demos push at power chair with RUE preventing lateral scoot to position for squat pivot and requires vc and repositioning for improved scoot to R. Squat pivot completed in one attempt. Posterior scoot in chair with MaxA +2 as well as with positioning in power chair.   Despite previous day's positioning in w/c with ATP, headrest has been moved and required repositioning to more supportive position for lateral flexion support. Pt jokingly relates need for "rubber band" to keep head in place.   Patient seated upright in w/c at end of session with brakes locked, belt alarm set, and all needs within reach. Pt seated at sink to fix hair. Oriented to time and  time of next therapy session in 20 min.    Therapy Documentation Precautions:  Precautions Precautions: Fall Required Braces or Orthoses: Splint/Cast Splint/Cast: L resting hand splint Restrictions Weight Bearing Restrictions: No General:   Vital Signs:  Pain:  No pain related this session.   Therapy/Group: Individual Therapy  Loel Dubonnet PT, DPT, CSRS` 05/04/2022, 1:18 PM

## 2022-05-04 NOTE — Progress Notes (Signed)
Inpatient Rehabilitation Discharge Medication Review by a Pharmacist  A complete drug regimen review was completed for this patient to identify any potential clinically significant medication issues.  High Risk Drug Classes Is patient taking? Indication by Medication  Antipsychotic No   Anticoagulant No   Antibiotic No   Opioid Yes Tapentadol - pain  Antiplatelet No   Hypoglycemics/insulin No   Vasoactive Medication No   Chemotherapy No   Other Yes Acidophilus, Vit B12, fiber, Kcl, Vit C, Vit D, - supplement Meloxicam - pain Modafinil - somnolence Pantoprazole - GERD ppx Trazodone - sleep Bupropion - mood Baclofen, diazepam - muscle spasms Conjugated estrogen - vasomotor symptoms  dalfampridine  - MS Docusate, Senokot-S, Miralax - constipation Trospium - urinary incontinence      Type of Medication Issue Identified Description of Issue Recommendation(s)  Drug Interaction(s) (clinically significant)     Duplicate Therapy     Allergy     No Medication Administration End Date     Incorrect Dose     Additional Drug Therapy Needed     Significant med changes from prior encounter (inform family/care partners about these prior to discharge). Albuterol, amantadine, naproxen, cyproheptadine, furosemide, methadone, methocarbamol, montelukast, ranitidine, oxycodone, tizanidine removed from medication list Communicate medication changes with patient/family at discharge  Other       Clinically significant medication issues were identified that warrant physician communication and completion of prescribed/recommended actions by midnight of the next day:  No  Pharmacist comments: n/a   Time spent performing this drug regimen review (minutes): 20   Thank you for allowing pharmacy to be a part of this patient's care.  Thelma Barge, PharmD Clinical Pharmacist

## 2022-05-07 NOTE — Progress Notes (Signed)
Occupational Therapy Discharge Summary  Patient Details  Name: Sharon Odom MRN: [id number] Date of Birth: Jan 04, 1971  Date of Discharge from OT service:May 04, 2022    Patient has met 2 of 4 long term goals due to ability to compensate for deficits and improved attention.  Patient to discharge at overall Mod Assist level.  Patient's care partner is independent to provide the necessary physical and cognitive assistance at discharge.    Reasons goals not met: Pt had significant decline in mobility and L sided strength due to medical issues while in rehab. Goals adjusted based on SNF level discharge placement. Family Educ completed at current level.   Recommendation:  Patient will benefit from ongoing skilled OT services in skilled nursing facility setting to continue to advance functional skills in the area of BADL and Reduce care partner burden.  Equipment: Pt had needed equipment in home, rec [place] lift currently in SNF.   Reasons for discharge: lack of progress toward goals  Patient/family agrees with progress made and goals achieved: Yes  OT Discharge ADL ADL Equipment Provided: Long-handled sponge Eating: Set up Where Assessed-Eating: Wheelchair, Bed level Grooming: Setup Where Assessed-Grooming: Sitting at sink Upper Body Bathing: Minimal assistance Where Assessed-Upper Body Bathing: Sitting at sink Lower Body Bathing: Moderate assistance Where Assessed-Lower Body Bathing: Sitting at sink, Standing at sink Upper Body Dressing: Minimal assistance Where Assessed-Upper Body Dressing: Wheelchair Lower Body Dressing: Moderate assistance Where Assessed-Lower Body Dressing: Wheelchair Toileting: Unable to assess Toilet Transfer: Unable to assess Social research officer, government: Unable to assess ADL Comments: Pt completed self care routine power w/c level sink side with assisted standing for LB bathing and dressing, uses Foley for voiding and OT will assess toileting next visit as  pt did not require this session. Vision Baseline Vision/History: 1 Wears glasses Patient Visual Report: No change from baseline Vision Assessment?: No apparent visual deficits Perception  Perception: Within Functional Limits Praxis Praxis: Intact Cognition Cognition Overall Cognitive Status: History of cognitive impairments - at baseline Arousal/Alertness: Awake/alert Memory: Impaired Memory Impairment: Decreased recall of new information Attention: Sustained Sustained Attention: Appears intact Awareness: Appears intact Problem Solving: Impaired Problem Solving Impairment: Verbal complex Safety/Judgment: Impaired Sensation Sensation Light Touch: Impaired by gross assessment Hot/Cold: Appears Intact Proprioception: Appears Intact Stereognosis: Impaired by gross assessment Additional Comments: numbness & pins and needles in bilateral feet and L UE Coordination Gross Motor Movements are Fluid and Coordinated: No Fine Motor Movements are Fluid and Coordinated: No Coordination and Movement Description: grossly uncoordinated due to increased spasticity, weakness and balance deficits Finger Nose Finger Test: R UE intact, L UE slowed, dysmetria, overshoots Heel Shin Test: R LE decreased ROM and delayed, unable to perform L LE 9 Hole Peg Test: n/a Motor  Motor Motor: Abnormal tone Motor - Skilled Clinical Observations: PMH of Secondary Progressive MS with increased spasticity Motor - Discharge Observations: Generalized weakness and limited with increased spasticity 2/2 progressive MS Mobility  Bed Mobility Bed Mobility: Rolling Right;Rolling Left;Supine to Sit;Sit to Supine Rolling Right: Maximal Assistance - Patient 25-49% Rolling Left: Maximal Assistance - Patient 25-49% Supine to Sit: Maximal Assistance - Patient - Patient 25-49% Sit to Supine: Maximal Assistance - Patient 25-49% Transfers Sit to Stand: Maximal Assistance - Patient 25-49% Stand to Sit: Maximal Assistance -  Patient 25-49%  Trunk/Postural Assessment  Cervical Assessment Cervical Assessment: Exceptions to [facility] (right cervical lateral flexion and forward head) Thoracic Assessment Thoracic Assessment: Exceptions to [facility] (windswept posture) Lumbar Assessment Lumbar Assessment: Exceptions to [facility] (posterior pelvic tilt)  Postural Control Postural Control: Deficits on evaluation Head Control: decreased right lateral cervical flexion Trunk Control: requires max A for sitting balance Righting Reactions: decreased Protective Responses: decreased  Balance Balance Balance Assessed: Yes Static Sitting Balance Static Sitting - Balance Support: Feet supported Static Sitting - Level of Assistance: 2: Max assist Dynamic Sitting Balance Dynamic Sitting - Balance Support: Feet supported;During functional activity Dynamic Sitting - Level of Assistance: 1: +2 Total assist Dynamic Sitting - Balance Activities: Reaching for objects Extremity/Trunk Assessment RUE Assessment RUE Assessment: Within Functional Limits RUE Tone RUE Tone: Modified Ashworth Body Part - Modified Ashworth Scale: Elbow;Wrist;Fingers;Thumb Elbow - Modified Ashworth Scale for Grading Hypertonia RUE: No increase in muscle tone Fingers - Modified [place]worth Scale for Grading Hypertonia RUE: No increase in muscle tone Thumb - Modified [place]worth Scale for Grading Hypertonia RUE: No increase in muscle tone Modified [place]worth Scale for Grading Hypertonia RUE: No increase in muscle tone LUE Assessment LUE Assessment: Exceptions to [facility] General Strength Comments: 2/5 LUE Tone LUE Tone: Modified Ashworth Body Part - Modified [place]worth Scale: Elbow;Wrist;Fingers;Thumb Elbow - Modified Ashworth Scale for Grading Hypertonia LUE: No increase in muscle tone Wrist - Modified Ashworth Scale for Grading Hypertonia LUE: No increase in muscle tone Fingers - Modified Ashworth Scale for Grading Hypertonia LUE: No increase in muscle tone Thumb - Modified  Ashworth Scale for Grading Hypertonia LUE: No increase in muscle tone Modified Ashworth Scale for Grading Hypertonia LUE: No increase in muscle tone   Barnabas Lister 05/07/2022, 7:00 PM

## 2022-05-07 NOTE — Plan of Care (Signed)
  Problem: RH Balance Goal: LTG: Patient will maintain dynamic sitting balance (OT) Description: LTG:  Patient will maintain dynamic sitting balance with assistance during activities of daily living (OT) Outcome: Adequate for Discharge   Problem: RH Toileting Goal: LTG Patient will perform toileting task (3/3 steps) with assistance level (OT) Description: LTG: Patient will perform toileting task (3/3 steps) with assistance level (OT)  Outcome: Adequate for Discharge

## 2022-05-07 NOTE — Progress Notes (Signed)
Physical Therapy Discharge Summary  Patient Details  Name: Sharon Odom MRN: [id number] Date of Birth: 05-31-1970  Date of Discharge from PT service:May 04, 2022   Patient has met 1 of 2 long term goals.  Patient to discharge at a wheelchair level Total Assist.   Pt with limited progress in mobility due progression in disease process. Pt requires max A for bed mobility and +2 for transfers. PT educated pt and family regarding dependent lift transfers via hoyer lift and pt's spouse demonstrates competency. During admission, w/c ATP performed PWC modifications to increase head/trunk stability and increase   Reasons goals not met: Pt did not meet w/c mobility goal due to decline in cognitive status. Pt continues to require verbal cues for w/c management and set-up.   Recommendation:  Patient will benefit from ongoing skilled PT services in skilled nursing facility setting to continue to advance safe functional mobility, address ongoing impairments in cognition, mobility, and minimize fall risk.  Equipment: N/A   Reasons for discharge: discharge from hospital  Patient/family agrees with progress made and goals achieved: Yes  PT Discharge Pain Interference Pain Interference Pain Effect on Sleep: 3. Frequently Pain Interference with Therapy Activities: 3. Frequently Pain Interference with Day-to-Day Activities: 3. Frequently Cognition Overall Cognitive Status: History of cognitive impairments - at baseline Arousal/Alertness: Awake/alert Orientation Level: Oriented X4 Memory: Impaired Awareness: Appears intact Problem Solving: Impaired Safety/Judgment: Impaired Sensation Sensation Light Touch: Impaired by gross assessment Proprioception: Appears Intact Additional Comments: numbness & pins and needles in bilateral feet and L UE Coordination Gross Motor Movements are Fluid and Coordinated: No Fine Motor Movements are Fluid and Coordinated: No Coordination and Movement  Description: grossly uncoordinated due to increased spasticity, weakness and balance deficits Finger Nose Finger Test: R UE intact, L UE slowed, dysmetria, overshoots Heel Shin Test: R LE decreased ROM and delayed, unable to perform L LE Motor  Motor Motor: Abnormal tone Motor - Skilled Clinical Observations: PMH of Secondary Progressive MS with increased spasticity Motor - Discharge Observations: Generalized weakness and limited with increased spasticity 2/2 progressive MS  Mobility Bed Mobility Bed Mobility: Rolling Right;Rolling Left;Supine to Sit;Sit to Supine Rolling Right: Maximal Assistance - Patient 25-49% Rolling Left: Maximal Assistance - Patient 25-49% Supine to Sit: Maximal Assistance - Patient - Patient 25-49% Sit to Supine: Maximal Assistance - Patient 25-49% Transfers Transfers: Sit to Stand;Stand to Sit;Squat Pivot Transfers Sit to Stand: Maximal Assistance - Patient 25-49% Stand to Sit: Maximal Assistance - Patient 25-49% Stand Pivot Transfers: 2 Helpers Squat Pivot Transfers: 2 Helpers Locomotion  Gait Ambulation: No Gait Gait: No Stairs / Additional Locomotion Stairs: No Pick up small object from the floor (from standing position) activity did not occur: Safety/medical concerns (unable to perform due to weakness, balance/coordiantion deficits) Product manager Mobility: Yes Wheelchair Assistance: Chartered loss adjuster: Power Wheelchair Parts Management: Needs assistance Distance: 300 ft  Trunk/Postural Assessment  Cervical Assessment Cervical Assessment: Exceptions to [facility] (right cervical lateral flexion and forward head) Thoracic Assessment Thoracic Assessment: Exceptions to [facility] (windswept posture) Lumbar Assessment Lumbar Assessment: Exceptions to [facility] (posterior pelvic tilt) Postural Control Postural Control: Deficits on evaluation Head Control: decreased right lateral cervical flexion Trunk Control: requires max  A for sitting balance Righting Reactions: decreased Protective Responses: decreased  Balance Balance Balance Assessed: Yes Static Sitting Balance Static Sitting - Balance Support: Feet supported Static Sitting - Level of Assistance: 2: Max assist Dynamic Sitting Balance Dynamic Sitting - Balance Support: Feet supported;During functional activity Dynamic Sitting - Level  of Assistance: 1: +2 Total assist Dynamic Sitting - Balance Activities: Reaching for objects Extremity Assessment  RLE Assessment RLE Assessment: Exceptions to [facility] General Strength Comments: grossly 2/5 except ankle DF/PF 4/5 LLE Assessment LLE Assessment: Exceptions to [facility] General Strength Comments: grossly 1/5   Tanja Port PT, DPT  05/07/2022, 4:06 PM

## 2022-05-10 ENCOUNTER — Institutional Professional Consult (permissible substitution): Payer: BLUE CROSS/BLUE SHIELD | Admitting: Neurology

## 2022-05-14 ENCOUNTER — Telehealth: Payer: Self-pay

## 2022-05-14 NOTE — Telephone Encounter (Signed)
Per Dr. Dahlia Client note on 03/02/2022 medication has been decreased and stopped. [facility] has been informed.

## 2022-05-16 ENCOUNTER — Encounter: Payer: Self-pay | Admitting: *Deleted

## 2022-05-16 ENCOUNTER — Other Ambulatory Visit: Payer: Self-pay

## 2022-05-16 MED ORDER — LAMOTRIGINE 25 MG PO TABS: ORAL_TABLET | ORAL | 5 refills | Status: AC

## 2022-05-26 ENCOUNTER — Inpatient Hospital Stay (HOSPITAL_COMMUNITY)
Admission: EM | Admit: 2022-05-26 | Discharge: 2022-05-28 | DRG: 698 | Disposition: E | Payer: BLUE CROSS/BLUE SHIELD | Source: Skilled Nursing Facility | Attending: Internal Medicine | Admitting: Internal Medicine

## 2022-05-26 ENCOUNTER — Emergency Department (HOSPITAL_COMMUNITY): Payer: BLUE CROSS/BLUE SHIELD

## 2022-05-26 DIAGNOSIS — Y738 Miscellaneous gastroenterology and urology devices associated with adverse incidents, not elsewhere classified: Secondary | ICD-10-CM | POA: Diagnosis present

## 2022-05-26 DIAGNOSIS — J9600 Acute respiratory failure, unspecified whether with hypoxia or hypercapnia: Secondary | ICD-10-CM | POA: Diagnosis not present

## 2022-05-26 DIAGNOSIS — Z515 Encounter for palliative care: Secondary | ICD-10-CM | POA: Diagnosis not present

## 2022-05-26 DIAGNOSIS — R509 Fever, unspecified: Secondary | ICD-10-CM | POA: Diagnosis present

## 2022-05-26 DIAGNOSIS — G9341 Metabolic encephalopathy: Secondary | ICD-10-CM | POA: Diagnosis present

## 2022-05-26 DIAGNOSIS — Z791 Long term (current) use of non-steroidal anti-inflammatories (NSAID): Secondary | ICD-10-CM

## 2022-05-26 DIAGNOSIS — A419 Sepsis, unspecified organism: Secondary | ICD-10-CM | POA: Diagnosis present

## 2022-05-26 DIAGNOSIS — Z681 Body mass index (BMI) 19 or less, adult: Secondary | ICD-10-CM

## 2022-05-26 DIAGNOSIS — Y846 Urinary catheterization as the cause of abnormal reaction of the patient, or of later complication, without mention of misadventure at the time of the procedure: Secondary | ICD-10-CM | POA: Diagnosis present

## 2022-05-26 DIAGNOSIS — Z885 Allergy status to narcotic agent status: Secondary | ICD-10-CM

## 2022-05-26 DIAGNOSIS — G928 Other toxic encephalopathy: Secondary | ICD-10-CM | POA: Diagnosis present

## 2022-05-26 DIAGNOSIS — Z79899 Other long term (current) drug therapy: Secondary | ICD-10-CM

## 2022-05-26 DIAGNOSIS — Z888 Allergy status to other drugs, medicaments and biological substances status: Secondary | ICD-10-CM

## 2022-05-26 DIAGNOSIS — Z9889 Other specified postprocedural states: Secondary | ICD-10-CM

## 2022-05-26 DIAGNOSIS — G8929 Other chronic pain: Secondary | ICD-10-CM | POA: Diagnosis present

## 2022-05-26 DIAGNOSIS — Z79890 Hormone replacement therapy (postmenopausal): Secondary | ICD-10-CM

## 2022-05-26 DIAGNOSIS — E860 Dehydration: Secondary | ICD-10-CM | POA: Diagnosis present

## 2022-05-26 DIAGNOSIS — L89159 Pressure ulcer of sacral region, unspecified stage: Secondary | ICD-10-CM | POA: Diagnosis not present

## 2022-05-26 DIAGNOSIS — G35 Multiple sclerosis: Secondary | ICD-10-CM | POA: Diagnosis present

## 2022-05-26 DIAGNOSIS — Z20822 Contact with and (suspected) exposure to covid-19: Secondary | ICD-10-CM | POA: Diagnosis present

## 2022-05-26 DIAGNOSIS — F17210 Nicotine dependence, cigarettes, uncomplicated: Secondary | ICD-10-CM | POA: Diagnosis present

## 2022-05-26 DIAGNOSIS — B961 Klebsiella pneumoniae [K. pneumoniae] as the cause of diseases classified elsewhere: Secondary | ICD-10-CM | POA: Diagnosis present

## 2022-05-26 DIAGNOSIS — J45998 Other asthma: Secondary | ICD-10-CM | POA: Diagnosis present

## 2022-05-26 DIAGNOSIS — G9340 Encephalopathy, unspecified: Secondary | ICD-10-CM | POA: Diagnosis not present

## 2022-05-26 DIAGNOSIS — T83511A Infection and inflammatory reaction due to indwelling urethral catheter, initial encounter: Secondary | ICD-10-CM | POA: Diagnosis present

## 2022-05-26 DIAGNOSIS — J189 Pneumonia, unspecified organism: Secondary | ICD-10-CM | POA: Diagnosis not present

## 2022-05-26 DIAGNOSIS — T50915A Adverse effect of multiple unspecified drugs, medicaments and biological substances, initial encounter: Secondary | ICD-10-CM | POA: Diagnosis present

## 2022-05-26 DIAGNOSIS — R1312 Dysphagia, oropharyngeal phase: Secondary | ICD-10-CM | POA: Diagnosis present

## 2022-05-26 DIAGNOSIS — R64 Cachexia: Secondary | ICD-10-CM | POA: Diagnosis present

## 2022-05-26 DIAGNOSIS — N390 Urinary tract infection, site not specified: Secondary | ICD-10-CM | POA: Diagnosis present

## 2022-05-26 DIAGNOSIS — K5900 Constipation, unspecified: Secondary | ICD-10-CM

## 2022-05-26 DIAGNOSIS — K219 Gastro-esophageal reflux disease without esophagitis: Secondary | ICD-10-CM | POA: Diagnosis present

## 2022-05-26 DIAGNOSIS — F32A Depression, unspecified: Secondary | ICD-10-CM | POA: Diagnosis present

## 2022-05-26 DIAGNOSIS — N179 Acute kidney failure, unspecified: Secondary | ICD-10-CM | POA: Diagnosis present

## 2022-05-26 DIAGNOSIS — J690 Pneumonitis due to inhalation of food and vomit: Secondary | ICD-10-CM | POA: Diagnosis not present

## 2022-05-26 DIAGNOSIS — K2289 Other specified disease of esophagus: Secondary | ICD-10-CM | POA: Diagnosis present

## 2022-05-26 DIAGNOSIS — R6889 Other general symptoms and signs: Secondary | ICD-10-CM | POA: Diagnosis present

## 2022-05-26 DIAGNOSIS — R1310 Dysphagia, unspecified: Secondary | ICD-10-CM | POA: Diagnosis not present

## 2022-05-26 DIAGNOSIS — F419 Anxiety disorder, unspecified: Secondary | ICD-10-CM | POA: Diagnosis present

## 2022-05-26 DIAGNOSIS — Z880 Allergy status to penicillin: Secondary | ICD-10-CM

## 2022-05-26 DIAGNOSIS — K592 Neurogenic bowel, not elsewhere classified: Secondary | ICD-10-CM | POA: Diagnosis present

## 2022-05-26 DIAGNOSIS — K5641 Fecal impaction: Secondary | ICD-10-CM | POA: Diagnosis present

## 2022-05-26 DIAGNOSIS — K828 Other specified diseases of gallbladder: Secondary | ICD-10-CM | POA: Diagnosis present

## 2022-05-26 DIAGNOSIS — Z978 Presence of other specified devices: Secondary | ICD-10-CM

## 2022-05-26 DIAGNOSIS — N319 Neuromuscular dysfunction of bladder, unspecified: Secondary | ICD-10-CM | POA: Diagnosis present

## 2022-05-26 DIAGNOSIS — Z8701 Personal history of pneumonia (recurrent): Secondary | ICD-10-CM

## 2022-05-26 DIAGNOSIS — Z66 Do not resuscitate: Secondary | ICD-10-CM | POA: Diagnosis present

## 2022-05-26 DIAGNOSIS — T83518A Infection and inflammatory reaction due to other urinary catheter, initial encounter: Principal | ICD-10-CM | POA: Diagnosis present

## 2022-05-26 DIAGNOSIS — R6520 Severe sepsis without septic shock: Secondary | ICD-10-CM | POA: Diagnosis not present

## 2022-05-26 DIAGNOSIS — Z7189 Other specified counseling: Secondary | ICD-10-CM | POA: Diagnosis not present

## 2022-05-26 LAB — CBC WITH DIFFERENTIAL/PLATELET
Abs Immature Granulocytes: 0 10*3/uL (ref 0.00–0.07)
Basophils Absolute: 0 10*3/uL (ref 0.0–0.1)
Basophils Relative: 0 %
Eosinophils Absolute: 0 10*3/uL (ref 0.0–0.5)
Eosinophils Relative: 0 %
HCT: 38.9 % (ref 36.0–46.0)
Hemoglobin: 12.7 g/dL (ref 12.0–15.0)
Lymphocytes Relative: 1 %
Lymphs Abs: 0.1 10*3/uL — ABNORMAL LOW (ref 0.7–4.0)
MCH: 32.1 pg (ref 26.0–34.0)
MCHC: 32.6 g/dL (ref 30.0–36.0)
MCV: 98.2 fL (ref 80.0–100.0)
Monocytes Absolute: 0.4 10*3/uL (ref 0.1–1.0)
Monocytes Relative: 4 %
Neutro Abs: 9.2 10*3/uL — ABNORMAL HIGH (ref 1.7–7.7)
Neutrophils Relative %: 95 %
Platelets: 303 10*3/uL (ref 150–400)
RBC: 3.96 MIL/uL (ref 3.87–5.11)
RDW: 14.8 % (ref 11.5–15.5)
WBC: 9.7 10*3/uL (ref 4.0–10.5)
nRBC: 0 % (ref 0.0–0.2)
nRBC: 0 /100 WBC

## 2022-05-26 LAB — COMPREHENSIVE METABOLIC PANEL
ALT: 11 U/L (ref 0–44)
AST: 15 U/L (ref 15–41)
Albumin: 2.4 g/dL — ABNORMAL LOW (ref 3.5–5.0)
Alkaline Phosphatase: 71 U/L (ref 38–126)
Anion gap: 13 (ref 5–15)
BUN: 49 mg/dL — ABNORMAL HIGH (ref 6–20)
CO2: 18 mmol/L — ABNORMAL LOW (ref 22–32)
Calcium: 8.9 mg/dL (ref 8.9–10.3)
Chloride: 105 mmol/L (ref 98–111)
Creatinine, Ser: 1.18 mg/dL — ABNORMAL HIGH (ref 0.44–1.00)
GFR, Estimated: 56 mL/min — ABNORMAL LOW (ref >60)
Glucose, Bld: 131 mg/dL — ABNORMAL HIGH (ref 70–99)
Potassium: 4.9 mmol/L (ref 3.5–5.1)
Sodium: 136 mmol/L (ref 135–145)
Total Bilirubin: 0.6 mg/dL (ref 0.3–1.2)
Total Protein: 5.2 g/dL — ABNORMAL LOW (ref 6.5–8.1)

## 2022-05-26 LAB — URINALYSIS, ROUTINE W REFLEX MICROSCOPIC
Glucose, UA: NEGATIVE mg/dL
Hgb urine dipstick: NEGATIVE
Ketones, ur: 5 mg/dL — AB
Nitrite: NEGATIVE
Protein, ur: 30 mg/dL — AB
Specific Gravity, Urine: 1.028 (ref 1.005–1.030)
pH: 5 (ref 5.0–8.0)

## 2022-05-26 LAB — LACTIC ACID, PLASMA
Lactic Acid, Venous: 1.4 mmol/L (ref 0.5–1.9)
Lactic Acid, Venous: 1.9 mmol/L (ref 0.5–1.9)

## 2022-05-26 LAB — PROTIME-INR
INR: 1.2 (ref 0.8–1.2)
Prothrombin Time: 14.9 seconds (ref 11.4–15.2)

## 2022-05-26 LAB — APTT: aPTT: 31 seconds (ref 24–36)

## 2022-05-26 LAB — I-STAT BETA HCG BLOOD, ED (MC, WL, AP ONLY): I-stat hCG, quantitative: <5 m[IU]/mL (ref <5)

## 2022-05-26 LAB — RESP PANEL BY RT-PCR (RSV, FLU A&B, COVID)  RVPGX2
Influenza A by PCR: NEGATIVE
Influenza B by PCR: NEGATIVE
Resp Syncytial Virus by PCR: NEGATIVE
SARS Coronavirus 2 by RT PCR: NEGATIVE

## 2022-05-26 MED ORDER — SODIUM CHLORIDE 0.9 % IV SOLN: 2.0000 g | Freq: Two times a day (BID) | INTRAVENOUS | Status: DC

## 2022-05-26 MED ORDER — VANCOMYCIN HCL 750 MG/150ML IV SOLN
750.0000 mg | INTRAVENOUS | Status: DC
  Filled 2022-05-26: qty 150

## 2022-05-26 MED ORDER — IOHEXOL 350 MG/ML SOLN
75.0000 mL | Freq: Once | INTRAVENOUS | Status: AC | PRN
  Administered 2022-05-26: 75 mL via INTRAVENOUS

## 2022-05-26 MED ORDER — LACTATED RINGERS IV SOLN: INTRAVENOUS | Status: AC

## 2022-05-26 MED ORDER — KETOROLAC TROMETHAMINE 15 MG/ML IJ SOLN
15.0000 mg | Freq: Once | INTRAMUSCULAR | Status: AC
  Administered 2022-05-26: 15 mg via INTRAVENOUS
  Filled 2022-05-26: qty 1

## 2022-05-26 MED ORDER — METRONIDAZOLE 500 MG/100ML IV SOLN
500.0000 mg | Freq: Once | INTRAVENOUS | Status: AC
  Administered 2022-05-26: 500 mg via INTRAVENOUS
  Filled 2022-05-26: qty 100

## 2022-05-26 MED ORDER — LACTATED RINGERS IV BOLUS (SEPSIS)
500.0000 mL | Freq: Once | INTRAVENOUS | Status: AC
  Administered 2022-05-26: 500 mL via INTRAVENOUS

## 2022-05-26 MED ORDER — LACTATED RINGERS IV BOLUS (SEPSIS)
250.0000 mL | Freq: Once | INTRAVENOUS | Status: AC
  Administered 2022-05-26: 250 mL via INTRAVENOUS

## 2022-05-26 MED ORDER — SODIUM CHLORIDE 0.9 % IV SOLN
2.0000 g | Freq: Once | INTRAVENOUS | Status: AC
  Administered 2022-05-26: 2 g via INTRAVENOUS
  Filled 2022-05-26: qty 12.5

## 2022-05-26 MED ORDER — LACTATED RINGERS IV BOLUS (SEPSIS)
1000.0000 mL | Freq: Once | INTRAVENOUS | Status: AC
  Administered 2022-05-26: 1000 mL via INTRAVENOUS

## 2022-05-26 MED ORDER — SODIUM CHLORIDE 0.9 % IV SOLN
3.0000 g | Freq: Three times a day (TID) | INTRAVENOUS | Status: DC
  Administered 2022-05-27 (×2): 3 g via INTRAVENOUS
  Filled 2022-05-26 (×3): qty 8

## 2022-05-26 MED ORDER — VANCOMYCIN HCL IN DEXTROSE 1-5 GM/200ML-% IV SOLN
1000.0000 mg | Freq: Once | INTRAVENOUS | Status: AC
  Administered 2022-05-26: 1000 mg via INTRAVENOUS
  Filled 2022-05-26: qty 200

## 2022-05-26 NOTE — ED Triage Notes (Signed)
Patient arrived to the ED via EMS from [facility]. EMS reports [facility] stated the patient was having abdominal pain. EMS reports UTI and VRE in urine known as soon as Thursday. Patient is nonverbal due to MS.

## 2022-05-26 NOTE — ED Notes (Signed)
Urine obtained from port in catheter tubing

## 2022-05-26 NOTE — ED Provider Notes (Signed)
Patient with UTI.  Assumed care of patient at 3 PM.  History of MS.  Febrile meets sepsis criteria.  She is responded well to fluids.  Admitted to medicine for further sepsis care in the setting of catheter associated UTI.  Patient is DNR/DNI.  This chart was dictated using voice recognition software.  Despite best efforts to proofread,  errors can occur which can change the documentation meaning.    Virgina Norfolk, DO 04/28/2022 [phone]

## 2022-05-26 NOTE — ED Notes (Signed)
RN requested overnight physician for patient vs.

## 2022-05-26 NOTE — Progress Notes (Signed)
Pharmacy Antibiotic Note  Sharon Odom is a 51 y.o. female admitted on 05/25/2022 with sepsis.  Pharmacy has been consulted for cefepime and vancomycin dosing.  Patient with slight AKI, baseline Scr: ~0.7, current 1.18.   Plan: Give IV Vancomycin 1000 x 1 for loading dose, followed by IV Vancomycin 750 every 24 hours for [id number]. Using TBW as patient is below IBW. Vd: 0.72, Scr: 1.18. Start cefepime 2g every 12 hours.  Follow culture data for de-escalation.  Monitor renal function for dose adjustments as indicated.   Weight: 54 kg (119 lb 0.8 oz)  Temp (24hrs), Avg:100 F (37.8 C), Min:98.5 F (36.9 C), Max:101.4 F (38.6 C)  Recent Labs  Lab 05/05/2022 1336  WBC 9.7  CREATININE 1.18*  LATICACIDVEN 1.4    Estimated Creatinine Clearance: 48.6 mL/min (A) (by C-G formula based on SCr of 1.18 mg/dL (H)).    Allergies  Allergen Reactions   Ziconotide Acetate Shortness Of Breath    Anorexia, weird sensations, could not talk, choking feeling, lost since of taste and smell. Hallucinations, vivid dreams. Confusion and sleepiness. N/v, increase in pain.  Anorexia, weird sensations, could not talk, choking feeling, lost since of taste and smell. Hallucinations, vivid dreams. Confusion and sleepiness. N/v, increase in pain.    Morphine Other (See Comments)    Pt does not recall reaction    Amoxicillin Nausea And Vomiting   Dantrolene Other (See Comments)    Muscle pain   Lamotrigine Other (See Comments)    Join pain   Lyrica [Pregabalin] Nausea And Vomiting   Thank you for allowing pharmacy to be a part of this patient's care.  Estill Batten, PharmD, BCCCP  05/13/2022 3:39 PM

## 2022-05-26 NOTE — Progress Notes (Signed)
[facility] is following code sepsis 

## 2022-05-26 NOTE — ED Provider Notes (Addendum)
[facility] EMERGENCY DEPARTMENT Provider Note   CSN: [id number] Arrival date & time: 05/18/2022  1328     History  Chief Complaint  Patient presents with   Code Sepsis    Sharon Odom is a 51 y.o. female.  HPI     51yo female with history of secondary progressive MS with neurogenic bowel, neurogenic bladder, left greater than right spasticity with subsequent implantation of intrathecal pump for baclofen, with revision of the abdominal portion of the pump by Dr. Reatha Armour 8/17, admitted diagnosed with acute toxic and metabolic encephalopathy from dehydration, UTI and medication excess, pseudo-exacerbation of MS, palliative care consult at that time with decision to be DNR who presents from [facility] who presents with concern for altered mental status, fever, generalized weakness.  Husband reports while others cannot always understand her and she uses a voice amplifier, he typically can understand her until yesterday. Yesterday seemed to be sleepy, having a hard time keeping eyes open, not responding to him normally.  Seemed sleepy.  He thnks she may have had abdominal pain. No nausea, vomiting, cough. She has left sided weakness and sicne recent hospitalization has also been weak on the right lower extremity. Still has had function of right arm.  He reports she has dysphagia but did not want to do pureed diet. He reports palliative care saw her in hospital and knows she is DNR/DNI but is not sure regarding other thoughts on abx/comfort care.  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    Past Medical History:  Diagnosis Date   Allergies    Anxiety    Arthritis    Asthma    seasonal - pollen   Constipation    Depression    Dyspnea    with exertion   GERD (gastroesophageal reflux disease)    Headache    History of hiatal hernia    Left radial nerve palsy 03/15/2020   resolved - Left arm/hand weak   Multiple sclerosis ([place])     Pneumonia 2007   x 1   Vision abnormalities    Wears glasses     Home Medications Prior to Admission medications   Medication Sig Start Date End Date Taking? Authorizing Provider  acetaminophen (TYLENOL) 500 MG tablet Take 2 tablets (1,000 mg total) by mouth 3 (three) times daily. 05/04/22  Yes Setzer, Edman Circle, PA-C  acidophilus (RISAQUAD) CAPS capsule Take 1 capsule by mouth 3 (three) times daily. 05/04/22  Yes Setzer, Edman Circle, PA-C  ascorbic acid (VITAMIN C) 500 MG tablet Take 1,000 mg by mouth daily.   Yes [provider]  baclofen (LIORESAL) 20 MG tablet Take 1 tablet (20 mg total) by mouth every 8 (eight) hours. 05/04/22  Yes Setzer, Edman Circle, PA-C  bisacodyl (DULCOLAX) 10 MG suppository Place 1 suppository (10 mg total) rectally daily as needed for severe constipation. 04/03/22  Yes Danford, Suann Larry, MD  buPROPion (WELLBUTRIN XL) 300 MG 24 hr tablet TAKE 1 TABLET DAILY Patient taking differently: Take 300 mg by mouth daily. 04/20/20  Yes Lovorn, Jinny Blossom, MD  calcium carbonate (TUMS - DOSED IN MG ELEMENTAL CALCIUM) 500 MG chewable tablet Chew 1 tablet (200 mg of elemental calcium total) by mouth 3 (three) times daily as needed for indigestion or heartburn. 03/23/22  Yes Setzer, Edman Circle, PA-C  Cholecalciferol (VITAMIN D-3) 125 MCG (5000 UT) TABS Take 5,000 Units by mouth daily. 04/15/19  Yes Angiulli, Lavon Paganini, PA-C  conjugated estrogens (PREMARIN) vaginal cream  Place 1 g vaginally See admin instructions. Apply 1 gram vaginally every Monday, Wednesday and friday   Yes [provider]  cyanocobalamin 2500 MCG TABS Take 2,500 mcg by mouth daily. 05/05/22  Yes Setzer, Edman Circle, PA-C  dalfampridine 10 MG TB12 Take 1 tablet (10 mg total) by mouth 2 (two) times daily. 05/04/22  Yes Setzer, Edman Circle, PA-C  diazepam (VALIUM) 5 MG tablet Take 1 tablet (5 mg total) by mouth every 8 (eight) hours. 05/04/22  Yes Setzer, Edman Circle, PA-C  docusate sodium (COLACE) 100 MG capsule Take 1  capsule (100 mg total) by mouth at bedtime. 05/04/22  Yes Setzer, Edman Circle, PA-C  fluconazole (DIFLUCAN) 200 MG tablet Take 200 mg by mouth daily.   Yes [provider]  meloxicam (MOBIC) 15 MG tablet Take 1 tablet (15 mg total) by mouth daily. 05/05/22  Yes Setzer, Edman Circle, PA-C  modafinil (PROVIGIL) 100 MG tablet Take 1 tablet (100 mg total) by mouth daily. 05/05/22  Yes Setzer, Edman Circle, PA-C  omeprazole (PRILOSEC) 20 MG capsule Take 20 mg by mouth daily.   Yes [provider]  oxybutynin (DITROPAN-XL) 5 MG 24 hr tablet Take 5 mg by mouth at bedtime.   Yes [provider]  Oxycodone HCl 10 MG TABS Take 10 mg by mouth every 4 (four) hours as needed (pain).   Yes [provider]  pantoprazole (PROTONIX) 40 MG tablet Take 1 tablet (40 mg total) by mouth 2 (two) times daily. 05/04/22  Yes Setzer, Edman Circle, PA-C  polycarbophil (FIBERCON) 625 MG tablet Take 625 mg by mouth daily.   Yes [provider]  polyethylene glycol (MIRALAX / GLYCOLAX) 17 g packet Take 17 g by mouth daily. 05/05/22  Yes Setzer, Edman Circle, PA-C  polyvinyl alcohol (LIQUIFILM TEARS) 1.4 % ophthalmic solution Place 1 drop into both eyes 3 (three) times daily as needed for dry eyes. 05/04/22  Yes Setzer, Edman Circle, PA-C  Psyllium (METAMUCIL) 0.36 g CAPS Take 5 capsules by mouth daily. 05/05/22  Yes Setzer, Edman Circle, PA-C  senna-docusate (SENOKOT-S) 8.6-50 MG tablet Take 2 tablets by mouth daily. 05/05/22  Yes Setzer, Edman Circle, PA-C  traZODone (DESYREL) 100 MG tablet Take 1 tablet (100 mg total) by mouth at bedtime. 05/04/22  Yes Setzer, Edman Circle, PA-C  antiseptic oral rinse (BIOTENE) LIQD 15 mLs by Mouth Rinse route as needed for dry mouth. Patient not taking: Reported on 04/29/2022 05/04/22   Vaughan Basta, Edman Circle, PA-C  fiber (NUTRISOURCE FIBER) PACK packet Take 1 packet by mouth daily. Patient not taking: Reported on 05/25/2022 05/05/22   Barbie Banner, PA-C  lamoTRIgine (LAMICTAL) 25 MG tablet Take  one pill daily x 5 days, then one pill twice daily x 5 days, then one pill three times daily x 5 days, then 2 pills twice daily Patient not taking: Reported on 05/06/2022 05/16/22   Britt Bottom, MD  potassium chloride SA (KLOR-CON M) 20 MEQ tablet Take 1 tablet (20 mEq total) by mouth every other day while taking Lasix Patient not taking: Reported on 05/24/2022 03/23/22   Barbie Banner, PA-C  tapentadol (NUCYNTA) 75 MG tablet Take 1 tablet (75 mg total) by mouth 4 (four) times daily -  before meals and at bedtime. Patient not taking: Reported on 04/29/2022 05/04/22   Barbie Banner, PA-C  trospium (SANCTURA) 20 MG tablet Take 1 tablet (20 mg total) by mouth 2 (two) times daily. Patient not taking: Reported on 05/25/2022 05/04/22  Setzer, Edman Circle, PA-C      Allergies    Ziconotide acetate, Morphine, Amoxicillin, Dantrolene, Lamotrigine, and Lyrica [pregabalin]    Review of Systems   Review of Systems  Physical Exam Updated Vital Signs BP 104/72   Pulse (!) 103   Temp (!) 101.1 F (38.4 C) (Axillary)   Resp (!) 22   Wt 54 kg   SpO2 98%   BMI 17.58 kg/m  Physical Exam Vitals and nursing note reviewed.  Constitutional:      General: She is not in acute distress.    Appearance: She is well-developed. She is ill-appearing and toxic-appearing. She is not diaphoretic.  HENT:     Head: Normocephalic and atraumatic.  Eyes:     Conjunctiva/sclera: Conjunctivae normal.     Comments: Dry mucus membranes  Neck:     Comments: Spasm, torticollis chronic per husband Cardiovascular:     Rate and Rhythm: Normal rate and regular rhythm.     Heart sounds: Normal heart sounds. No murmur heard.    No friction rub. No gallop.  Pulmonary:     Effort: No respiratory distress.     Breath sounds: Normal breath sounds. No wheezing or rales.     Comments: weakness Abdominal:     General: There is distension.     Palpations: Abdomen is soft.     Tenderness: There is abdominal tenderness.  There is no guarding.     Comments: Baclofen pump   Musculoskeletal:        General: No tenderness.  Skin:    General: Skin is warm and dry.     Findings: No erythema or rash.  Neurological:     Mental Status: She is alert.     Comments: Left sided weakness, right leg able to move but no lift, right upper extremity squeezes hand, lifts some but appears weak. Opens mouth and tonbue appears midline but appears weak     ED Results / Procedures / Treatments   Labs (all labs ordered are listed, but only abnormal results are displayed) Labs Reviewed  COMPREHENSIVE METABOLIC PANEL - Abnormal; Notable for the following components:      Result Value   CO2 18 (*)    Glucose, Bld 131 (*)    BUN 49 (*)    Creatinine, Ser 1.18 (*)    Total Protein 5.2 (*)    Albumin 2.4 (*)    GFR, Estimated 56 (*)    All other components within normal limits  CBC WITH DIFFERENTIAL/PLATELET - Abnormal; Notable for the following components:   Neutro Abs 9.2 (*)    Lymphs Abs 0.1 (*)    All other components within normal limits  URINALYSIS, ROUTINE W REFLEX MICROSCOPIC - Abnormal; Notable for the following components:   Color, Urine AMBER (*)    APPearance HAZY (*)    Bilirubin Urine SMALL (*)    Ketones, ur 5 (*)    Protein, ur 30 (*)    Leukocytes,Ua MODERATE (*)    Bacteria, UA MANY (*)    All other components within normal limits  RESP PANEL BY RT-PCR (RSV, FLU A&B, COVID)  RVPGX2  CULTURE, BLOOD (ROUTINE X 2)  CULTURE, BLOOD (ROUTINE X 2)  URINE CULTURE  LACTIC ACID, PLASMA  LACTIC ACID, PLASMA  PROTIME-INR  APTT  I-STAT BETA HCG BLOOD, ED ([facility], [facility], [facility] ONLY)    EKG EKG Interpretation  Date/Time:  Saturday May 26 2022 15:55:30 EST Ventricular Rate:  95 PR Interval:  160  QRS Duration: 80 QT Interval:  365 QTC Calculation: 459 R Axis:   25 Text Interpretation: Sinus rhythm Confirmed by Virgina Norfolk ([id number]) on 05/23/2022 4:07:44 PM  Radiology CT ABDOMEN PELVIS W  CONTRAST  Result Date: 05/14/2022 CLINICAL DATA:  Concern for pulmonary embolus. Bowel obstruction suspected. EXAM: CT ANGIOGRAPHY CHEST CT ABDOMEN AND PELVIS WITH CONTRAST TECHNIQUE: Multidetector CT imaging of the chest was performed using the standard protocol during bolus administration of intravenous contrast. Multiplanar CT image reconstructions and MIPs were obtained to evaluate the vascular anatomy. Multidetector CT imaging of the abdomen and pelvis was performed using the standard protocol during bolus administration of intravenous contrast. RADIATION DOSE REDUCTION: This exam was performed according to the departmental dose-optimization program which includes automated exposure control, adjustment of the mA and/or kV according to patient size and/or use of iterative reconstruction technique. CONTRAST:  35mL OMNIPAQUE IOHEXOL 350 MG/ML SOLN COMPARISON:  Multiple priors including chest CT July 17, 2021 and CT abdomen August 24, 2021. FINDINGS: CTA CHEST FINDINGS Cardiovascular: Satisfactory opacification of the pulmonary arteries to the segmental level. No evidence of pulmonary embolism. Normal caliber thoracic aorta. Mild cardiac enlargement. No pericardial effusion. Mediastinum/Nodes: Dilated esophagus filled with fluid and ingested tablets. No pathologically enlarged mediastinal, hilar or axillary lymph nodes. The esophagus is grossly unremarkable. Lungs/Pleura: Diffuse interstitial opacities with interposed ground-glass opacities. Consolidations in the dependent bilateral lower lobes. Left lower lobe pneumatocele. No pleural effusion. No pneumothorax. Musculoskeletal: No acute osseous abnormality. Review of the MIP images confirms the above findings. CT ABDOMEN and PELVIS FINDINGS Hepatobiliary: No suspicious hepatic lesion. Gallbladder is distended without wall thickening. Prominence of the common bile duct measuring upper limits of normal at 6 mm. Pancreas: No pancreatic ductal dilation or  evidence of acute inflammation. Spleen: No splenomegaly or focal splenic lesion. Adrenals/Urinary Tract: Bilateral adrenal glands appear normal. No hydronephrosis. Subcentimeter hypodense left renal lesion is technically too small to accurately characterize but statistically likely to reflect a cyst and considered benign requiring no independent imaging follow-up. Urinary bladder is decompressed around a Foley catheter. Stomach/Bowel: Stomach is minimally distended limiting evaluation. No pathologic dilation of small bowel. The appendix is not confidently identified in its entirety however there is no pericecal inflammation. Large volume of stool fills the colon with the cecum measuring 8.6 cm in diameter. Large volume of stool in the rectal vault with mild rectal wall thickening and adjacent stranding. Vascular/Lymphatic: Aortic atherosclerosis. Normal caliber abdominal aorta. The portal, splenic and superior mesenteric veins are patent. IVC is slightly compressed. No pathologic dilation of small or large bowel. Reproductive: No acute abnormality. Other: No significant abdominopelvic free fluid. Musculoskeletal: Multilevel degenerative changes spine. No acute osseous abnormality. Review of the MIP images confirms the above findings. IMPRESSION: CTA CHEST: 1. No evidence of pulmonary embolism. 2. Diffuse interstitial opacities with interposed ground-glass opacities, may reflect pulmonary edema or atypical infection. 3. Consolidations in the dependent bilateral lower lobes, suspicious for aspiration versus atelectasis. 4. Dilated esophagus filled with fluid and ingested tablets. CT ABDOMEN/PELVIS: 1. Very large volume of stool throughout dilated colon with stool in the rectal vault and mild rectal wall thickening with trace adjacent stranding. Findings are compatible with severe constipation and possible stercoral colitis. 2. Gallbladder is distended without wall thickening. Prominence of the common bile duct  measuring upper limits of normal at 6 mm. Recommend correlation with LFTs. 3.  Aortic Atherosclerosis (ICD10-I70.0). Electronically Signed   By: Maudry Mayhew M.D.   On: 05/20/2022 17:55   CT Angio Chest  PE W and/or Wo Contrast  Result Date: 05/08/2022 CLINICAL DATA:  Concern for pulmonary embolus. Bowel obstruction suspected. EXAM: CT ANGIOGRAPHY CHEST CT ABDOMEN AND PELVIS WITH CONTRAST TECHNIQUE: Multidetector CT imaging of the chest was performed using the standard protocol during bolus administration of intravenous contrast. Multiplanar CT image reconstructions and MIPs were obtained to evaluate the vascular anatomy. Multidetector CT imaging of the abdomen and pelvis was performed using the standard protocol during bolus administration of intravenous contrast. RADIATION DOSE REDUCTION: This exam was performed according to the departmental dose-optimization program which includes automated exposure control, adjustment of the mA and/or kV according to patient size and/or use of iterative reconstruction technique. CONTRAST:  44mL OMNIPAQUE IOHEXOL 350 MG/ML SOLN COMPARISON:  Multiple priors including chest CT July 17, 2021 and CT abdomen August 24, 2021. FINDINGS: CTA CHEST FINDINGS Cardiovascular: Satisfactory opacification of the pulmonary arteries to the segmental level. No evidence of pulmonary embolism. Normal caliber thoracic aorta. Mild cardiac enlargement. No pericardial effusion. Mediastinum/Nodes: Dilated esophagus filled with fluid and ingested tablets. No pathologically enlarged mediastinal, hilar or axillary lymph nodes. The esophagus is grossly unremarkable. Lungs/Pleura: Diffuse interstitial opacities with interposed ground-glass opacities. Consolidations in the dependent bilateral lower lobes. Left lower lobe pneumatocele. No pleural effusion. No pneumothorax. Musculoskeletal: No acute osseous abnormality. Review of the MIP images confirms the above findings. CT ABDOMEN and PELVIS FINDINGS  Hepatobiliary: No suspicious hepatic lesion. Gallbladder is distended without wall thickening. Prominence of the common bile duct measuring upper limits of normal at 6 mm. Pancreas: No pancreatic ductal dilation or evidence of acute inflammation. Spleen: No splenomegaly or focal splenic lesion. Adrenals/Urinary Tract: Bilateral adrenal glands appear normal. No hydronephrosis. Subcentimeter hypodense left renal lesion is technically too small to accurately characterize but statistically likely to reflect a cyst and considered benign requiring no independent imaging follow-up. Urinary bladder is decompressed around a Foley catheter. Stomach/Bowel: Stomach is minimally distended limiting evaluation. No pathologic dilation of small bowel. The appendix is not confidently identified in its entirety however there is no pericecal inflammation. Large volume of stool fills the colon with the cecum measuring 8.6 cm in diameter. Large volume of stool in the rectal vault with mild rectal wall thickening and adjacent stranding. Vascular/Lymphatic: Aortic atherosclerosis. Normal caliber abdominal aorta. The portal, splenic and superior mesenteric veins are patent. IVC is slightly compressed. No pathologic dilation of small or large bowel. Reproductive: No acute abnormality. Other: No significant abdominopelvic free fluid. Musculoskeletal: Multilevel degenerative changes spine. No acute osseous abnormality. Review of the MIP images confirms the above findings. IMPRESSION: CTA CHEST: 1. No evidence of pulmonary embolism. 2. Diffuse interstitial opacities with interposed ground-glass opacities, may reflect pulmonary edema or atypical infection. 3. Consolidations in the dependent bilateral lower lobes, suspicious for aspiration versus atelectasis. 4. Dilated esophagus filled with fluid and ingested tablets. CT ABDOMEN/PELVIS: 1. Very large volume of stool throughout dilated colon with stool in the rectal vault and mild rectal wall  thickening with trace adjacent stranding. Findings are compatible with severe constipation and possible stercoral colitis. 2. Gallbladder is distended without wall thickening. Prominence of the common bile duct measuring upper limits of normal at 6 mm. Recommend correlation with LFTs. 3.  Aortic Atherosclerosis (ICD10-I70.0). Electronically Signed   By: Maudry Mayhew M.D.   On: 05/16/2022 17:55   DG Abd Portable 1 View  Result Date: 05/17/2022 CLINICAL DATA:  Possible sepsis. EXAM: PORTABLE ABDOMEN - 1 VIEW COMPARISON:  03/22/2022 FINDINGS: Again demonstrated is a large amount of stool throughout the:.  A right abdominal battery pack is again demonstrated. No acute bony abnormality. IMPRESSION: Large stool burden. Electronically Signed   By: Claudie Revering M.D.   On: 05/05/2022 14:52   DG Chest Portable 1 View  Result Date: 04/29/2022 CLINICAL DATA:  Sepsis. EXAM: PORTABLE CHEST 1 VIEW COMPARISON:  Abdomen radiographs obtained at the same time. Chest radiographs dated 03/24/2022. Chest CT dated 07/17/2021. FINDINGS: The cardiac silhouette remains at the upper limit of normal in size, accentuated by poor inspiration and mild elevation of the right hemidiaphragm. Minimal right basilar atelectasis with improvement. Otherwise, clear lungs. Diffuse osteopenia. IMPRESSION: Minimal right basilar atelectasis with improvement. Electronically Signed   By: Claudie Revering M.D.   On: 05/21/2022 14:47    Procedures .Critical Care  Performed by: Gareth Morgan, MD Authorized by: Gareth Morgan, MD   Critical care provider statement:    Critical care time (minutes):  30   Critical care was time spent personally by me on the following activities:  Development of treatment plan with patient or surrogate, examination of patient, ordering and review of laboratory studies, ordering and review of radiographic studies, ordering and performing treatments and interventions, pulse oximetry and review of old charts      Medications Ordered in ED Medications  lactated ringers infusion ( Intravenous New Bag/Given 05/04/2022 1751)  ceFEPIme (MAXIPIME) 2 g in sodium chloride 0.9 % 100 mL IVPB (has no administration in time range)  vancomycin (VANCOREADY) IVPB 750 mg/150 mL (has no administration in time range)  lactated ringers bolus 1,000 mL (0 mLs Intravenous Stopped 05/22/2022 1625)    And  lactated ringers bolus 500 mL (0 mLs Intravenous Stopped 05/02/2022 1641)    And  lactated ringers bolus 250 mL (0 mLs Intravenous Stopped 04/27/2022 1753)  ceFEPIme (MAXIPIME) 2 g in sodium chloride 0.9 % 100 mL IVPB (0 g Intravenous Stopped 05/10/2022 1602)  metroNIDAZOLE (FLAGYL) IVPB 500 mg (0 mg Intravenous Stopped 05/15/2022 1624)  vancomycin (VANCOCIN) IVPB 1000 mg/200 mL premix (0 mg Intravenous Stopped 05/22/2022 1621)  iohexol (OMNIPAQUE) 350 MG/ML injection 75 mL (75 mLs Intravenous Contrast Given 04/27/2022 1732)  ketorolac (TORADOL) 15 MG/ML injection 15 mg (15 mg Intravenous Given 05/14/2022 1902)    ED Course/ Medical Decision Making/ A&P                            51yo female with history of secondary progressive MS with neurogenic bowel, neurogenic bladder, left greater than right spasticity with subsequent implantation of intrathecal pump for baclofen, with revision of the abdominal portion of the pump by Dr. Reatha Armour 8/17, admitted diagnosed with acute toxic and metabolic encephalopathy from dehydration, UTI and medication excess, pseudo-exacerbation of MS, palliative care consult at that time with decision to be DNR who presents from [facility] who presents with concern for altered mental status, fever, generalized weakness.  Temperature 101.4, BP down to 85/54 concerning for sepsis.  She has significant encephalopathy likely secondary to sepsis and consider MS exacerbation or pseudo-exacerbation setting of infection. She is following commands. Appears very weak, discussed goals of care and confirmed DNR/DNI.  Husband less certain of her goals regarding full comfort vs abx,  MOST form reviewed and checked for "comfort" but also selected abx per situation and IV fluids.  In this setting, will provide abx/fluids (no pressors) and plan admission with continued discussion of goals.   CODE SEPSIS called and given IV fluids, broad spectrum abx. UA pending at this time,  reviewed prior cultures which were nearly pansensitive.Marland Kitchen  Hx of possible abdominal pain, has tenderness and distention. Will order CT abdome pelvis and obtain CT PE study given immobilization, screen for other pneumonia vs PE.   Care signed out to Dr. Ronnald Nian with  imaging, UA pending and plan for admission.           Final Clinical Impression(s) / ED Diagnoses Final diagnoses:  Sepsis, due to unspecified organism, unspecified whether acute organ dysfunction present [facility])  Urinary tract infection associated with indwelling urethral catheter, initial encounter [facility])    Rx / DC Orders ED Discharge Orders     None         Gareth Morgan, MD 05/13/2022 2153

## 2022-05-26 NOTE — ED Notes (Signed)
Patient opened mouth when asked, unable to open enough for oral temperature.

## 2022-05-26 NOTE — Progress Notes (Signed)
Pharmacy Antibiotic Note  Sharon Odom is a 51 y.o. female admitted on 06/15/22 with sepsis.  Pharmacy has been consulted for unasyn and vancomycin dosing.  Patient with slight AKI, baseline Scr: ~0.7, current 1.18.   Plan: Continue IV Vancomycin 750 every 24 hours for [id number]. Using TBW as patient is below IBW. Vd: 0.72, Scr: 1.18. Unasyn 3gm IV q8h  Follow culture data for de-escalation.  Monitor renal function for dose adjustments as indicated.   Weight: 54 kg (119 lb 0.8 oz)  Temp (24hrs), Avg:100.3 F (37.9 C), Min:98.5 F (36.9 C), Max:101.4 F (38.6 C)  Recent Labs  Lab Jun 15, 2022 1336 15-Jun-2022 1547  WBC 9.7  --   CREATININE 1.18*  --   LATICACIDVEN 1.4 1.9     Estimated Creatinine Clearance: 48.6 mL/min (A) (by C-G formula based on SCr of 1.18 mg/dL (H)).    Allergies  Allergen Reactions   Ziconotide Acetate Shortness Of Breath    Anorexia, weird sensations, could not talk, choking feeling, lost since of taste and smell. Hallucinations, vivid dreams. Confusion and sleepiness. N/v, increase in pain.  Anorexia, weird sensations, could not talk, choking feeling, lost since of taste and smell. Hallucinations, vivid dreams. Confusion and sleepiness. N/v, increase in pain.    Morphine Other (See Comments)    Pt does not recall reaction    Amoxicillin Nausea And Vomiting   Dantrolene Other (See Comments)    Muscle pain   Lamotrigine Other (See Comments)    Join pain   Lyrica [Pregabalin] Nausea And Vomiting   Thank you for allowing pharmacy to be a part of this patient's care.  Christoper Fabian, PharmD, BCPS Please see amion for complete clinical pharmacist phone list 2022-06-15 10:38 PM

## 2022-05-26 NOTE — ED Notes (Signed)
Pressure sore seen on sacrum. Dressing applied.

## 2022-05-26 NOTE — ED Notes (Signed)
RN spoke to admitting MD for patient admission orders. RN was informed that ordered would be entered shortly

## 2022-05-26 NOTE — H&P (Addendum)
History and Physical    Patient: Sharon Odom GLO:[id number] DOB: 1970-11-26 DOA: Jun 16, 2022 DOS: the patient was seen and examined on 05/15/2022 PCP: Charisse March, NP-C  Patient coming from: SNF  Chief Complaint:  Chief Complaint  Patient presents with   Code Sepsis   HPI: Sharon Odom is a 51 y.o. female with medical history significant of multiple sclerosis, neurogenic bladder, chronic indwelling Foley catheter, bilateral spasticity ( L > R ) with subsequent application of intrathecal pump for baclofen who presents to the emergency department via EMS from [facility] health care due to fever.  Patient was unable to provide history due to being nonverbal at baseline.  History was obtained from ED medical record.  Per report, EMS reports patient has a UTI and presents with altered mental status, fever and generalized weakness.  Patient was reported to be very sleepy yesterday and could barely keep her eyes open.  Husband, who usually understands when she uses voice amplifier was unable to understand her since yesterday, husband thought that patient may have abdominal pain, and she has no nausea, cough or vomiting.  Apparently, patient was diagnosed with VRE during last hospitalization.  EMS was activated and patient was taken to the ED for further evaluation and management. Patient had limited mobility and uses a power wheelchair at baseline.  ED Course:  In the emergency department, in the emergency department, patient was febrile with a temperature of 101.93F, he was tachycardic and tachypneic, BP was in hypotensive range (85/54), but O2 sat was 91% on room air.  Workup in the ED showed normal CBC, BMP showed sodium 136, potassium 4.9, chloride 105, bicarb 18, blood glucose 131, BUN 49, creatinine 1.18 (baseline creatinine 0.7-0.8) .  Albumin 2.4, lactic acid x 2 was negative, urinalysis was positive for leukocytes and many bacteria.  Influenza A, B, SARS coronavirus 2, and RSV was negative. CT  angiography of chest No evidence of pulmonary embolism and showed diffuse interstitial opacities with interposed groundglass opacities, may reflect pulmonary edema or atypical infection.  Consolidations in the dependent bilateral lower lobes, suspicious for aspiration versus atelectasis.  Dilated esophagus filled with fluid and ingested tablets. CT abdomen and pelvis Very large volume of stool throughout dilated colon with stool in the rectal vault and mild rectal wall thickening with trace adjacent stranding. Findings are compatible with severe constipation and possible stercoral colitis. 2. Gallbladder is distended without wall thickening. Prominence of the common bile duct measuring upper limits of normal at 6 mm. Recommend correlation with LFTs. 3.  Aortic Atherosclerosis He was treated with IV cefepime, vancomycin, Flagyl, Toradol was given, IV hydration per sepsis protocol was provided.  Hospitalist was asked to admit patient for further evaluation and management.  Review of Systems: Review of systems as noted in the HPI. All other systems reviewed and are negative.   Past Medical History:  Diagnosis Date   Allergies    Anxiety    Arthritis    Asthma    seasonal - pollen   Constipation    Depression    Dyspnea    with exertion   GERD (gastroesophageal reflux disease)    Headache    History of hiatal hernia    Left radial nerve palsy 03/15/2020   resolved - Left arm/hand weak   Multiple sclerosis (HCC)    Pneumonia 2007   x 1   Vision abnormalities    Wears glasses    Past Surgical History:  Procedure Laterality Date   COLONOSCOPY  EXCISION MORTON'S NEUROMA Right    fallopian tube removal     INTRATHECAL PUMP IMPLANT Right 02/18/2019   Procedure: INTRATHECAL PUMP IMPLANT;  Surgeon: Odette Fraction, MD;  Location: [facility] OR;  Service: Neurosurgery;  Laterality: Right;  INTRATHECAL PUMP IMPLANT   INTRATHECAL PUMP IMPLANT Right 02/17/2021   Procedure: Baclofen Pump  Replacement;  Surgeon: Maeola Harman, MD;  Location: [facility] OR;  Service: Neurosurgery;  Laterality: Right;  3C/RM 21   INTRATHECAL PUMP REVISION N/A 01/11/2022   Procedure: Baclofen Pump revision;  Surgeon: Dawley, Alan Mulder, DO;  Location: [facility] OR;  Service: Neurosurgery;  Laterality: N/A;  RM 21 to follow   PAIN PUMP IMPLANTATION N/A 09/28/2021   Procedure: Insertion of baclofen pump, right lower quadrant;  Surgeon: Dawley, Alan Mulder, DO;  Location: [facility] OR;  Service: Neurosurgery;  Laterality: N/A;   PAIN PUMP REMOVAL Left 04/06/2021   Procedure: Removal of baclofen pump, Left;  Surgeon: Dawley, Alan Mulder, DO;  Location: [facility] OR;  Service: Neurosurgery;  Laterality: Left;  Lateral/Left//3C rm 19   UPPER GI ENDOSCOPY     WISDOM TOOTH EXTRACTION     WISDOM TOOTH EXTRACTION      Social History:  reports that she has been smoking cigarettes. She started smoking about 36 years ago. She has a 16.00 pack-year smoking history. She has never used smokeless tobacco. She reports that she does not currently use alcohol. She reports that she does not use drugs.   Allergies  Allergen Reactions   Ziconotide Acetate Shortness Of Breath    Anorexia, weird sensations, could not talk, choking feeling, lost since of taste and smell. Hallucinations, vivid dreams. Confusion and sleepiness. N/v, increase in pain.  Anorexia, weird sensations, could not talk, choking feeling, lost since of taste and smell. Hallucinations, vivid dreams. Confusion and sleepiness. N/v, increase in pain.    Morphine Other (See Comments)    Pt does not recall reaction    Amoxicillin Nausea And Vomiting   Dantrolene Other (See Comments)    Muscle pain   Lamotrigine Other (See Comments)    Join pain   Lyrica [Pregabalin] Nausea And Vomiting    Family History  Problem Relation Age of Onset   Diabetes Mother    Healthy Brother      Prior to Admission medications   Medication Sig Start Date End Date Taking? Authorizing Provider  acetaminophen  (TYLENOL) 500 MG tablet Take 2 tablets (1,000 mg total) by mouth 3 (three) times daily. 05/04/22  Yes Setzer, Lynnell Jude, PA-C  acidophilus (RISAQUAD) CAPS capsule Take 1 capsule by mouth 3 (three) times daily. 05/04/22  Yes Setzer, Lynnell Jude, PA-C  ascorbic acid (VITAMIN C) 500 MG tablet Take 1,000 mg by mouth daily.   Yes [provider]  baclofen (LIORESAL) 20 MG tablet Take 1 tablet (20 mg total) by mouth every 8 (eight) hours. 05/04/22  Yes Setzer, Lynnell Jude, PA-C  bisacodyl (DULCOLAX) 10 MG suppository Place 1 suppository (10 mg total) rectally daily as needed for severe constipation. 04/03/22  Yes Danford, Earl Lites, MD  buPROPion (WELLBUTRIN XL) 300 MG 24 hr tablet TAKE 1 TABLET DAILY Patient taking differently: Take 300 mg by mouth daily. 04/20/20  Yes Lovorn, Aundra Millet, MD  calcium carbonate (TUMS - DOSED IN MG ELEMENTAL CALCIUM) 500 MG chewable tablet Chew 1 tablet (200 mg of elemental calcium total) by mouth 3 (three) times daily as needed for indigestion or heartburn. 03/23/22  Yes Setzer, Lynnell Jude, PA-C  Cholecalciferol (VITAMIN D-3) 125 MCG (5000  UT) TABS Take 5,000 Units by mouth daily. 04/15/19  Yes Angiulli, Mcarthur Rossetti, PA-C  conjugated estrogens (PREMARIN) vaginal cream Place 1 g vaginally See admin instructions. Apply 1 gram vaginally every Monday, Wednesday and friday   Yes [provider]  cyanocobalamin 2500 MCG TABS Take 2,500 mcg by mouth daily. 05/05/22  Yes Setzer, Lynnell Jude, PA-C  dalfampridine 10 MG TB12 Take 1 tablet (10 mg total) by mouth 2 (two) times daily. 05/04/22  Yes Setzer, Lynnell Jude, PA-C  diazepam (VALIUM) 5 MG tablet Take 1 tablet (5 mg total) by mouth every 8 (eight) hours. 05/04/22  Yes Setzer, Lynnell Jude, PA-C  docusate sodium (COLACE) 100 MG capsule Take 1 capsule (100 mg total) by mouth at bedtime. 05/04/22  Yes Setzer, Lynnell Jude, PA-C  fluconazole (DIFLUCAN) 200 MG tablet Take 200 mg by mouth daily.   Yes [provider]  meloxicam (MOBIC) 15 MG  tablet Take 1 tablet (15 mg total) by mouth daily. 05/05/22  Yes Setzer, Lynnell Jude, PA-C  modafinil (PROVIGIL) 100 MG tablet Take 1 tablet (100 mg total) by mouth daily. 05/05/22  Yes Setzer, Lynnell Jude, PA-C  omeprazole (PRILOSEC) 20 MG capsule Take 20 mg by mouth daily.   Yes [provider]  oxybutynin (DITROPAN-XL) 5 MG 24 hr tablet Take 5 mg by mouth at bedtime.   Yes [provider]  Oxycodone HCl 10 MG TABS Take 10 mg by mouth every 4 (four) hours as needed (pain).   Yes [provider]  pantoprazole (PROTONIX) 40 MG tablet Take 1 tablet (40 mg total) by mouth 2 (two) times daily. 05/04/22  Yes Setzer, Lynnell Jude, PA-C  polycarbophil (FIBERCON) 625 MG tablet Take 625 mg by mouth daily.   Yes [provider]  polyethylene glycol (MIRALAX / GLYCOLAX) 17 g packet Take 17 g by mouth daily. 05/05/22  Yes Setzer, Lynnell Jude, PA-C  polyvinyl alcohol (LIQUIFILM TEARS) 1.4 % ophthalmic solution Place 1 drop into both eyes 3 (three) times daily as needed for dry eyes. 05/04/22  Yes Setzer, Lynnell Jude, PA-C  Psyllium (METAMUCIL) 0.36 g CAPS Take 5 capsules by mouth daily. 05/05/22  Yes Setzer, Lynnell Jude, PA-C  senna-docusate (SENOKOT-S) 8.6-50 MG tablet Take 2 tablets by mouth daily. 05/05/22  Yes Setzer, Lynnell Jude, PA-C  traZODone (DESYREL) 100 MG tablet Take 1 tablet (100 mg total) by mouth at bedtime. 05/04/22  Yes Setzer, Lynnell Jude, PA-C  antiseptic oral rinse (BIOTENE) LIQD 15 mLs by Mouth Rinse route as needed for dry mouth. Patient not taking: Reported on 05/26/2022 05/04/22   Valetta Fuller, Lynnell Jude, PA-C  fiber (NUTRISOURCE FIBER) PACK packet Take 1 packet by mouth daily. Patient not taking: Reported on 05/26/2022 05/05/22   Milinda Antis, PA-C  lamoTRIgine (LAMICTAL) 25 MG tablet Take one pill daily x 5 days, then one pill twice daily x 5 days, then one pill three times daily x 5 days, then 2 pills twice daily Patient not taking: Reported on 05/26/2022 05/16/22   Asa Lente, MD   potassium chloride SA (KLOR-CON M) 20 MEQ tablet Take 1 tablet (20 mEq total) by mouth every other day while taking Lasix Patient not taking: Reported on 05/26/2022 03/23/22   Milinda Antis, PA-C  tapentadol (NUCYNTA) 75 MG tablet Take 1 tablet (75 mg total) by mouth 4 (four) times daily -  before meals and at bedtime. Patient not taking: Reported on 05/26/2022 05/04/22   Milinda Antis, PA-C  trospium (SANCTURA) 20 MG  tablet Take 1 tablet (20 mg total) by mouth 2 (two) times daily. Patient not taking: Reported on May 31, 2022 05/04/22   Milinda Antis, PA-C    Physical Exam: BP 101/70   Pulse (!) 112   Temp 99.3 F (37.4 C)   Resp (!) 22   Wt 54 kg   SpO2 93%   BMI 17.58 kg/m   General: 51 y.o. year-old female ill appearing, but in no acute distress.   HEENT: NCAT, EOMI, dysphonia Neck: Supple, trachea medial Cardiovascular: Regular rate and rhythm with no rubs or gallops.  No thyromegaly or JVD noted.  No lower extremity edema. 2/4 pulses in all 4 extremities. Respiratory: Clear to auscultation with no wheezes or rales. Good inspiratory effort. Abdomen: Soft, nontender nondistended with normal bowel sounds x4 quadrants.  Intrathecal pump site noted. Muskuloskeletal: No cyanosis, clubbing or edema noted bilaterally Neuro: CN II-XII intact, sensation, reflexes intact Skin: No ulcerative lesions noted or rashes Psychiatry: Mood is appropriate for condition and setting          Labs on Admission:  Basic Metabolic Panel: Recent Labs  Lab 05-31-2022 1336  NA 136  K 4.9  CL 105  CO2 18*  GLUCOSE 131*  BUN 49*  CREATININE 1.18*  CALCIUM 8.9   Liver Function Tests: Recent Labs  Lab 05-31-22 1336  AST 15  ALT 11  ALKPHOS 71  BILITOT 0.6  PROT 5.2*  ALBUMIN 2.4*   No results for input(s): "LIPASE", "AMYLASE" in the last 168 hours. No results for input(s): "AMMONIA" in the last 168 hours. CBC: Recent Labs  Lab May 31, 2022 1336  WBC 9.7  NEUTROABS 9.2*  HGB 12.7   HCT 38.9  MCV 98.2  PLT 303   Cardiac Enzymes: No results for input(s): "CKTOTAL", "CKMB", "CKMBINDEX", "TROPONINI" in the last 168 hours.  BNP (last 3 results) No results for input(s): "BNP" in the last 8760 hours.  ProBNP (last 3 results) No results for input(s): "PROBNP" in the last 8760 hours.  CBG: No results for input(s): "GLUCAP" in the last 168 hours.  Radiological Exams on Admission: CT ABDOMEN PELVIS W CONTRAST  Result Date: 05/31/22 CLINICAL DATA:  Concern for pulmonary embolus. Bowel obstruction suspected. EXAM: CT ANGIOGRAPHY CHEST CT ABDOMEN AND PELVIS WITH CONTRAST TECHNIQUE: Multidetector CT imaging of the chest was performed using the standard protocol during bolus administration of intravenous contrast. Multiplanar CT image reconstructions and MIPs were obtained to evaluate the vascular anatomy. Multidetector CT imaging of the abdomen and pelvis was performed using the standard protocol during bolus administration of intravenous contrast. RADIATION DOSE REDUCTION: This exam was performed according to the departmental dose-optimization program which includes automated exposure control, adjustment of the mA and/or kV according to patient size and/or use of iterative reconstruction technique. CONTRAST:  75mL OMNIPAQUE IOHEXOL 350 MG/ML SOLN COMPARISON:  Multiple priors including chest CT July 17, 2021 and CT abdomen August 24, 2021. FINDINGS: CTA CHEST FINDINGS Cardiovascular: Satisfactory opacification of the pulmonary arteries to the segmental level. No evidence of pulmonary embolism. Normal caliber thoracic aorta. Mild cardiac enlargement. No pericardial effusion. Mediastinum/Nodes: Dilated esophagus filled with fluid and ingested tablets. No pathologically enlarged mediastinal, hilar or axillary lymph nodes. The esophagus is grossly unremarkable. Lungs/Pleura: Diffuse interstitial opacities with interposed ground-glass opacities. Consolidations in the dependent bilateral  lower lobes. Left lower lobe pneumatocele. No pleural effusion. No pneumothorax. Musculoskeletal: No acute osseous abnormality. Review of the MIP images confirms the above findings. CT ABDOMEN and PELVIS FINDINGS Hepatobiliary: No suspicious hepatic  lesion. Gallbladder is distended without wall thickening. Prominence of the common bile duct measuring upper limits of normal at 6 mm. Pancreas: No pancreatic ductal dilation or evidence of acute inflammation. Spleen: No splenomegaly or focal splenic lesion. Adrenals/Urinary Tract: Bilateral adrenal glands appear normal. No hydronephrosis. Subcentimeter hypodense left renal lesion is technically too small to accurately characterize but statistically likely to reflect a cyst and considered benign requiring no independent imaging follow-up. Urinary bladder is decompressed around a Foley catheter. Stomach/Bowel: Stomach is minimally distended limiting evaluation. No pathologic dilation of small bowel. The appendix is not confidently identified in its entirety however there is no pericecal inflammation. Large volume of stool fills the colon with the cecum measuring 8.6 cm in diameter. Large volume of stool in the rectal vault with mild rectal wall thickening and adjacent stranding. Vascular/Lymphatic: Aortic atherosclerosis. Normal caliber abdominal aorta. The portal, splenic and superior mesenteric veins are patent. IVC is slightly compressed. No pathologic dilation of small or large bowel. Reproductive: No acute abnormality. Other: No significant abdominopelvic free fluid. Musculoskeletal: Multilevel degenerative changes spine. No acute osseous abnormality. Review of the MIP images confirms the above findings. IMPRESSION: CTA CHEST: 1. No evidence of pulmonary embolism. 2. Diffuse interstitial opacities with interposed ground-glass opacities, may reflect pulmonary edema or atypical infection. 3. Consolidations in the dependent bilateral lower lobes, suspicious for  aspiration versus atelectasis. 4. Dilated esophagus filled with fluid and ingested tablets. CT ABDOMEN/PELVIS: 1. Very large volume of stool throughout dilated colon with stool in the rectal vault and mild rectal wall thickening with trace adjacent stranding. Findings are compatible with severe constipation and possible stercoral colitis. 2. Gallbladder is distended without wall thickening. Prominence of the common bile duct measuring upper limits of normal at 6 mm. Recommend correlation with LFTs. 3.  Aortic Atherosclerosis (ICD10-I70.0). Electronically Signed   By: Maudry Mayhew M.D.   On: 05/22/2022 17:55   CT Angio Chest PE W and/or Wo Contrast  Result Date: May 27, 2022 CLINICAL DATA:  Concern for pulmonary embolus. Bowel obstruction suspected. EXAM: CT ANGIOGRAPHY CHEST CT ABDOMEN AND PELVIS WITH CONTRAST TECHNIQUE: Multidetector CT imaging of the chest was performed using the standard protocol during bolus administration of intravenous contrast. Multiplanar CT image reconstructions and MIPs were obtained to evaluate the vascular anatomy. Multidetector CT imaging of the abdomen and pelvis was performed using the standard protocol during bolus administration of intravenous contrast. RADIATION DOSE REDUCTION: This exam was performed according to the departmental dose-optimization program which includes automated exposure control, adjustment of the mA and/or kV according to patient size and/or use of iterative reconstruction technique. CONTRAST:  75mL OMNIPAQUE IOHEXOL 350 MG/ML SOLN COMPARISON:  Multiple priors including chest CT July 17, 2021 and CT abdomen August 24, 2021. FINDINGS: CTA CHEST FINDINGS Cardiovascular: Satisfactory opacification of the pulmonary arteries to the segmental level. No evidence of pulmonary embolism. Normal caliber thoracic aorta. Mild cardiac enlargement. No pericardial effusion. Mediastinum/Nodes: Dilated esophagus filled with fluid and ingested tablets. No pathologically  enlarged mediastinal, hilar or axillary lymph nodes. The esophagus is grossly unremarkable. Lungs/Pleura: Diffuse interstitial opacities with interposed ground-glass opacities. Consolidations in the dependent bilateral lower lobes. Left lower lobe pneumatocele. No pleural effusion. No pneumothorax. Musculoskeletal: No acute osseous abnormality. Review of the MIP images confirms the above findings. CT ABDOMEN and PELVIS FINDINGS Hepatobiliary: No suspicious hepatic lesion. Gallbladder is distended without wall thickening. Prominence of the common bile duct measuring upper limits of normal at 6 mm. Pancreas: No pancreatic ductal dilation or evidence of acute inflammation.  Spleen: No splenomegaly or focal splenic lesion. Adrenals/Urinary Tract: Bilateral adrenal glands appear normal. No hydronephrosis. Subcentimeter hypodense left renal lesion is technically too small to accurately characterize but statistically likely to reflect a cyst and considered benign requiring no independent imaging follow-up. Urinary bladder is decompressed around a Foley catheter. Stomach/Bowel: Stomach is minimally distended limiting evaluation. No pathologic dilation of small bowel. The appendix is not confidently identified in its entirety however there is no pericecal inflammation. Large volume of stool fills the colon with the cecum measuring 8.6 cm in diameter. Large volume of stool in the rectal vault with mild rectal wall thickening and adjacent stranding. Vascular/Lymphatic: Aortic atherosclerosis. Normal caliber abdominal aorta. The portal, splenic and superior mesenteric veins are patent. IVC is slightly compressed. No pathologic dilation of small or large bowel. Reproductive: No acute abnormality. Other: No significant abdominopelvic free fluid. Musculoskeletal: Multilevel degenerative changes spine. No acute osseous abnormality. Review of the MIP images confirms the above findings. IMPRESSION: CTA CHEST: 1. No evidence of  pulmonary embolism. 2. Diffuse interstitial opacities with interposed ground-glass opacities, may reflect pulmonary edema or atypical infection. 3. Consolidations in the dependent bilateral lower lobes, suspicious for aspiration versus atelectasis. 4. Dilated esophagus filled with fluid and ingested tablets. CT ABDOMEN/PELVIS: 1. Very large volume of stool throughout dilated colon with stool in the rectal vault and mild rectal wall thickening with trace adjacent stranding. Findings are compatible with severe constipation and possible stercoral colitis. 2. Gallbladder is distended without wall thickening. Prominence of the common bile duct measuring upper limits of normal at 6 mm. Recommend correlation with LFTs. 3.  Aortic Atherosclerosis (ICD10-I70.0). Electronically Signed   By: Maudry Mayhew M.D.   On: 2022/06/12 17:55   DG Abd Portable 1 View  Result Date: 06/12/22 CLINICAL DATA:  Possible sepsis. EXAM: PORTABLE ABDOMEN - 1 VIEW COMPARISON:  03/22/2022 FINDINGS: Again demonstrated is a large amount of stool throughout the:. A right abdominal battery pack is again demonstrated. No acute bony abnormality. IMPRESSION: Large stool burden. Electronically Signed   By: Beckie Salts M.D.   On: 2022/06/12 14:52   DG Chest Portable 1 View  Result Date: 06/12/2022 CLINICAL DATA:  Sepsis. EXAM: PORTABLE CHEST 1 VIEW COMPARISON:  Abdomen radiographs obtained at the same time. Chest radiographs dated 03/24/2022. Chest CT dated 07/17/2021. FINDINGS: The cardiac silhouette remains at the upper limit of normal in size, accentuated by poor inspiration and mild elevation of the right hemidiaphragm. Minimal right basilar atelectasis with improvement. Otherwise, clear lungs. Diffuse osteopenia. IMPRESSION: Minimal right basilar atelectasis with improvement. Electronically Signed   By: Beckie Salts M.D.   On: 12-Jun-2022 14:47    EKG: I independently viewed the EKG done and my findings are as followed: Normal sinus  rhythm at a rate of 95 bpm   Assessment/Plan Present on Admission:  Sepsis secondary to UTI (HCC)  Multiple sclerosis (HCC)  Neurogenic bladder  Active Problems:   Dysphagia   Multiple sclerosis (HCC)   Neurogenic bladder   S/P insertion of intrathecal pump   Sepsis secondary to UTI (HCC)   AKI (acute kidney injury) (HCC)   Aspiration pneumonia (HCC)   Constipation   GERD (gastroesophageal reflux disease)   Decubitus ulcer of sacral area  Sepsis secondary to CAUTI POA and possible aspiration pneumonia Patient met sepsis criteria due to being febrile, tachycardic and tachypneic (met SIRS criteria), source of infection appears to be UTI as well as aspiration  Dilated esophagus filled with fluid and ingested tablets was noted  on CT angiogram of chest which may have been the source for aspiration Urine culture that was done on 11/27 was positive for Enterococcus faecium Urine culture that was done on 11/14 was positive for E. coli and this was sensitive to cefepime Patient was started on cefepime, Flagyl and vancomycin, we shall continue with Unasyn and vancomycin at this time with plan to de-escalate/discontinue based on blood culture, sputum culture, urine Legionella, strep pneumo and procalcitonin Continue Tylenol as needed Continue incentive spirometry, flutter valve   Neurogenic bladder and chronic indwelling catheter Continue management as described above Foley catheter will be changed  Acute kidney injury Dehydration BUN/creatinine 49/1.8 (baseline creatinine at 0.7-0.8) Continue IV hydration Renally adjust medications, avoid nephrotoxic agents/dehydration/hypotension  Dysphagia CT angiography of chest showed dilated esophagus filled with fluid and ingested tablets This may be due to dysphagia possibly secondary to reflux/esophageal motility disorder.   Secure chat sent to gastroenterology on-call (Dr. Leonides Schanz) for a consult in the morning   Constipation CT abdomen  pelvis was suggestive of constipation/stercoral colitis Continue antibiotic management as described for sepsis Last bowel movement unknown, patient was nonverbal at baseline per medical record Continue Dulcolax, docusate per home regimen  Sacral decubitus Continue wound care  Multiple sclerosis Dalfampridine will be held at this time due to patient currently having UTI  GERD Continue Protonix  DVT prophylaxis: Lovenox  Code Status: DNR/DNI  Family Communication: None at bedside  Consults: None  Severity of Illness: The appropriate patient status for this patient is INPATIENT. Inpatient status is judged to be reasonable and necessary in order to provide the required intensity of service to ensure the patient's safety. The patient's presenting symptoms, physical exam findings, and initial radiographic and laboratory data in the context of their chronic comorbidities is felt to place them at high risk for further clinical deterioration. Furthermore, it is not anticipated that the patient will be medically stable for discharge from the hospital within 2 midnights of admission.   * I certify that at the point of admission it is my clinical judgment that the patient will require inpatient hospital care spanning beyond 2 midnights from the point of admission due to high intensity of service, high risk for further deterioration and high frequency of surveillance required.*  Author: Frankey Shown, DO 06/06/2022 3:11 AM  For on call review [email or web address].

## 2022-05-27 DIAGNOSIS — J9600 Acute respiratory failure, unspecified whether with hypoxia or hypercapnia: Secondary | ICD-10-CM

## 2022-05-27 DIAGNOSIS — G35 Multiple sclerosis: Secondary | ICD-10-CM | POA: Diagnosis not present

## 2022-05-27 DIAGNOSIS — J189 Pneumonia, unspecified organism: Secondary | ICD-10-CM | POA: Diagnosis not present

## 2022-05-27 DIAGNOSIS — R6520 Severe sepsis without septic shock: Secondary | ICD-10-CM | POA: Diagnosis not present

## 2022-05-27 DIAGNOSIS — Z7189 Other specified counseling: Secondary | ICD-10-CM

## 2022-05-27 DIAGNOSIS — A419 Sepsis, unspecified organism: Secondary | ICD-10-CM | POA: Diagnosis not present

## 2022-05-27 DIAGNOSIS — T83511A Infection and inflammatory reaction due to indwelling urethral catheter, initial encounter: Secondary | ICD-10-CM | POA: Diagnosis not present

## 2022-05-27 DIAGNOSIS — G9340 Encephalopathy, unspecified: Secondary | ICD-10-CM

## 2022-05-27 DIAGNOSIS — Z515 Encounter for palliative care: Secondary | ICD-10-CM

## 2022-05-27 LAB — CBC
HCT: 42.3 % (ref 36.0–46.0)
Hemoglobin: 14 g/dL (ref 12.0–15.0)
MCH: 31.7 pg (ref 26.0–34.0)
MCHC: 33.1 g/dL (ref 30.0–36.0)
MCV: 95.7 fL (ref 80.0–100.0)
Platelets: 375 10*3/uL (ref 150–400)
RBC: 4.42 MIL/uL (ref 3.87–5.11)
RDW: 14.4 % (ref 11.5–15.5)
WBC: 10.2 10*3/uL (ref 4.0–10.5)
nRBC: 0 % (ref 0.0–0.2)

## 2022-05-27 LAB — LACTIC ACID, PLASMA
Lactic Acid, Venous: 2.4 mmol/L (ref 0.5–1.9)
Lactic Acid, Venous: 3.2 mmol/L (ref 0.5–1.9)

## 2022-05-27 LAB — COMPREHENSIVE METABOLIC PANEL
ALT: 10 U/L (ref 0–44)
AST: 19 U/L (ref 15–41)
Albumin: 2 g/dL — ABNORMAL LOW (ref 3.5–5.0)
Alkaline Phosphatase: 74 U/L (ref 38–126)
Anion gap: 9 (ref 5–15)
BUN: 59 mg/dL — ABNORMAL HIGH (ref 6–20)
CO2: 16 mmol/L — ABNORMAL LOW (ref 22–32)
Calcium: 8.5 mg/dL — ABNORMAL LOW (ref 8.9–10.3)
Chloride: 107 mmol/L (ref 98–111)
Creatinine, Ser: 0.95 mg/dL (ref 0.44–1.00)
GFR, Estimated: >60 mL/min (ref >60)
Glucose, Bld: 140 mg/dL — ABNORMAL HIGH (ref 70–99)
Potassium: 4.8 mmol/L (ref 3.5–5.1)
Sodium: 132 mmol/L — ABNORMAL LOW (ref 135–145)
Total Bilirubin: 0.5 mg/dL (ref 0.3–1.2)
Total Protein: 4.6 g/dL — ABNORMAL LOW (ref 6.5–8.1)

## 2022-05-27 LAB — PHOSPHORUS: Phosphorus: 3.8 mg/dL (ref 2.5–4.6)

## 2022-05-27 LAB — STREP PNEUMONIAE URINARY ANTIGEN: Strep Pneumo Urinary Antigen: NEGATIVE

## 2022-05-27 LAB — PROCALCITONIN: Procalcitonin: 2.9 ng/mL

## 2022-05-27 LAB — TSH: TSH: 2.981 u[IU]/mL (ref 0.350–4.500)

## 2022-05-27 LAB — MAGNESIUM: Magnesium: 2.2 mg/dL (ref 1.7–2.4)

## 2022-05-27 MED ORDER — PANTOPRAZOLE SODIUM 40 MG IV SOLR
40.0000 mg | INTRAVENOUS | Status: DC
  Administered 2022-05-27: 40 mg via INTRAVENOUS
  Filled 2022-05-27: qty 10

## 2022-05-27 MED ORDER — PANTOPRAZOLE SODIUM 40 MG IV SOLR: 40.0000 mg | Freq: Two times a day (BID) | INTRAVENOUS | Status: DC

## 2022-05-27 MED ORDER — DILTIAZEM LOAD VIA INFUSION: 10.0000 mg | Freq: Once | INTRAVENOUS | Status: DC

## 2022-05-27 MED ORDER — HYDROMORPHONE BOLUS VIA INFUSION
1.0000 mg | INTRAVENOUS | Status: DC | PRN
  Administered 2022-05-27: 1 mg via INTRAVENOUS

## 2022-05-27 MED ORDER — POLYVINYL ALCOHOL 1.4 % OP SOLN: 1.0000 [drp] | Freq: Four times a day (QID) | OPHTHALMIC | Status: DC | PRN

## 2022-05-27 MED ORDER — ONDANSETRON HCL 4 MG PO TABS: 4.0000 mg | ORAL_TABLET | Freq: Four times a day (QID) | ORAL | Status: DC | PRN

## 2022-05-27 MED ORDER — METRONIDAZOLE 500 MG/100ML IV SOLN: 500.0000 mg | Freq: Two times a day (BID) | INTRAVENOUS | Status: DC

## 2022-05-27 MED ORDER — SODIUM CHLORIDE 0.9 % IV SOLN: 2.0000 g | Freq: Three times a day (TID) | INTRAVENOUS | Status: DC

## 2022-05-27 MED ORDER — HYDROMORPHONE HCL 1 MG/ML IJ SOLN
0.5000 mg | INTRAMUSCULAR | Status: DC | PRN
  Administered 2022-05-27 (×2): 0.5 mg via INTRAVENOUS
  Filled 2022-05-27: qty 1
  Filled 2022-05-27: qty 0.5

## 2022-05-27 MED ORDER — GLYCOPYRROLATE 0.2 MG/ML IJ SOLN: 0.4000 mg | INTRAMUSCULAR | Status: DC | PRN

## 2022-05-27 MED ORDER — DOCUSATE SODIUM 100 MG PO CAPS: 100.0000 mg | ORAL_CAPSULE | Freq: Every day | ORAL | Status: DC

## 2022-05-27 MED ORDER — HYDROMORPHONE HCL-NACL 50-0.9 MG/50ML-% IV SOLN
2.0000 mg/h | INTRAVENOUS | Status: DC
  Administered 2022-05-27: 2 mg/h via INTRAVENOUS
  Filled 2022-05-27: qty 50

## 2022-05-27 MED ORDER — BIOTENE DRY MOUTH MT LIQD: 15.0000 mL | OROMUCOSAL | Status: DC | PRN

## 2022-05-27 MED ORDER — DILTIAZEM HCL-DEXTROSE 125-5 MG/125ML-% IV SOLN (PREMIX): 5.0000 mg/h | INTRAVENOUS | Status: DC

## 2022-05-27 MED ORDER — ENOXAPARIN SODIUM 40 MG/0.4ML IJ SOSY
40.0000 mg | PREFILLED_SYRINGE | Freq: Every day | INTRAMUSCULAR | Status: DC
  Administered 2022-05-27: 40 mg via SUBCUTANEOUS
  Filled 2022-05-27: qty 0.4

## 2022-05-27 MED ORDER — LACTATED RINGERS IV SOLN: INTRAVENOUS | Status: DC

## 2022-05-27 MED ORDER — DILTIAZEM HCL-DEXTROSE 125-5 MG/125ML-% IV SOLN (PREMIX)
INTRAVENOUS | Status: AC
  Filled 2022-05-27: qty 125

## 2022-05-27 MED ORDER — HYDROMORPHONE BOLUS VIA INFUSION: 1.0000 mg | INTRAVENOUS | Status: DC | PRN

## 2022-05-27 MED ORDER — SODIUM CHLORIDE 0.9 % IV SOLN
INTRAVENOUS | Status: DC | PRN
  Administered 2022-05-27: 10 mL/h via INTRAVENOUS

## 2022-05-27 MED ORDER — BISACODYL 10 MG RE SUPP: 10.0000 mg | Freq: Every day | RECTAL | Status: DC | PRN

## 2022-05-27 MED ORDER — HYDROMORPHONE HCL 1 MG/ML IJ SOLN: 1.0000 mg | INTRAMUSCULAR | Status: DC | PRN

## 2022-05-27 MED ORDER — LACTATED RINGERS IV BOLUS
1000.0000 mL | Freq: Once | INTRAVENOUS | Status: AC
  Administered 2022-05-27: 1000 mL via INTRAVENOUS

## 2022-05-27 MED ORDER — ACETAMINOPHEN 650 MG RE SUPP
650.0000 mg | Freq: Four times a day (QID) | RECTAL | Status: DC | PRN
  Administered 2022-05-27: 650 mg via RECTAL
  Filled 2022-05-27: qty 1

## 2022-05-27 MED ORDER — VANCOMYCIN HCL 1250 MG/250ML IV SOLN
1250.0000 mg | INTRAVENOUS | Status: DC
  Filled 2022-05-27: qty 250

## 2022-05-27 MED ORDER — LORAZEPAM 2 MG/ML IJ SOLN
1.0000 mg | INTRAMUSCULAR | Status: DC | PRN
  Administered 2022-05-27: 1 mg via INTRAVENOUS
  Filled 2022-05-27: qty 1

## 2022-05-27 MED ORDER — SORBITOL 70 % SOLN
400.0000 mL | TOPICAL_OIL | Freq: Once | ORAL | Status: DC
  Filled 2022-05-27: qty 120

## 2022-05-27 MED ORDER — ONDANSETRON HCL 4 MG/2ML IJ SOLN: 4.0000 mg | Freq: Four times a day (QID) | INTRAMUSCULAR | Status: DC | PRN

## 2022-05-27 MED ORDER — ACETAMINOPHEN 325 MG PO TABS: 650.0000 mg | ORAL_TABLET | Freq: Four times a day (QID) | ORAL | Status: DC | PRN

## 2022-05-28 LAB — URINE CULTURE: Culture: NO GROWTH

## 2022-05-28 NOTE — Progress Notes (Signed)
Post-mortem care performmed. Patient's clothing and other belongings taken home by husband earlier. One ring, one nose ring and one ear stud kept on the unit in a secure bag. NS Jill Side kept it in her locker and dayshift NS will be updated as well as the day shift RN.

## 2022-05-28 NOTE — Progress Notes (Signed)
Pharmacy Antibiotic Note  Sharon Odom is a 52 y.o. female admitted on 05-31-22 with sepsis.  Pharmacy has been consulted for Unasyn and vancomycin dosing; consulted to change to vancomycin and Zosyn however asked Dr Sherryll Burger if could [place] cefepime and metronidazole to prevent AKI - ok with change.  AKI improved, baseline Scr: ~0.7, down to 0.95. She is febrile with Tm 102.3, WBC up to 10.2, and PCT 2.9.  Plan: Increase vancomycin 1250 IV q24h for eAUC 481.5. Using TBW as patient is below IBW. Vd: 0.72, SCr: 0.95. Cefepime 2 g IV q8h  Metronidazole 500 mg IV q12h Follow culture data for de-escalation.  Monitor renal function for dose adjustments as indicated.   Weight: 60 kg (132 lb 4.4 oz)  Temp (24hrs), Avg:100.6 F (38.1 C), Min:98.3 F (36.8 C), Max:102.3 F (39.1 C)  Recent Labs  Lab 31-May-2022 1336 05-31-2022 1547 04/30/2022 0721 05/10/2022 1211  WBC 9.7  --  10.2  --   CREATININE 1.18*  --  0.95  --   LATICACIDVEN 1.4 1.9  --  2.4*     Estimated Creatinine Clearance: 67.1 mL/min (by C-G formula based on SCr of 0.95 mg/dL).    Allergies  Allergen Reactions   Ziconotide Acetate Shortness Of Breath    Anorexia, weird sensations, could not talk, choking feeling, lost since of taste and smell. Hallucinations, vivid dreams. Confusion and sleepiness. N/v, increase in pain.  Anorexia, weird sensations, could not talk, choking feeling, lost since of taste and smell. Hallucinations, vivid dreams. Confusion and sleepiness. N/v, increase in pain.    Morphine Other (See Comments)    Pt does not recall reaction    Amoxicillin Nausea And Vomiting   Dantrolene Other (See Comments)    Muscle pain   Lamotrigine Other (See Comments)    Join pain   Lyrica [Pregabalin] Nausea And Vomiting   Thank you for involving pharmacy in this patient's care.  Loura Back, PharmD, BCPS Clinical Pharmacist Clinical phone for 05/22/2022 is [phone] 04/28/2022 3:50 PM

## 2022-05-28 NOTE — Consult Note (Signed)
Referring Provider: Dr. Thomes Dinning Primary Care Physician:  Charisse March, NP-C Primary Gastroenterologist:  Gentry Fitz, GI in [facility]  Reason for Consultation: Dysphagia  HPI: Sharon Odom is a 52 y.o. female with a past medical history of anxiety, depression, progressive multiple sclerosis, neurogenic bladder and bowel, left greater than right spasticity with subsequent implantation of intrathecal pump for baclofen with revision of the abdominal portion of the pump 8/17, chronic pain, thrombocytopenia from [facility] and GERD.  Patient is nonverbal and history was obtained from the admission physician's  note, records in epic.and from her husband.   She presented to [facility], ED via EMS from [facility] 05/20/2022 due to exhibiting altered mental status, fever and generalized weakness.  EMS reported patient has UTI/VRE positive.  Labs in the ED showed a WBC count of 9.7.  Hemoglobin 12.7 (Hg 10.4 on 04/30/2022).  Hematocrit 38.9.  MCV 90.2.  Platelets 303.  INR 1.2.  BUN 49 up from 18.  Creatinine 1.18 up from 0.71.  Sodium 136.  Potassium 4.9.  Albumin 2.4.  Total bili 0.6.  AST 15.  ALT 11.  Lactic acid 1.4 -> 1.9.  Today. SARS coronavirus 2 negative.  Urine and blood cultures pending.  Strep pneumonia/Legionella results pending.  Sepsis protocol was initiated and she was started on IV cefepime, vancomycin, Flagyl and IV fluids. CTAP showed a large volume of stool throughout a dilated colon with stool in the rectal vault and mild rectal wall thickening with trace adjacent stranding, findings concerning for possible stercoral colitis.  Distended gallbladder without wall thickening with prominence of the CBD measuring 6 mm.  Chest CTA without PE and showed diffuse interstitial opacities with interspersed groundglass opacities, possibly reflecting pulmonary edema or atypical infection.  Consolidations in the dependent bilateral lower lobes also suspicious for aspiration versus  atelectasis.  The esophagus was dilated filled with fluid and ingested tablets. A GI consult was requested due to concerns regarding dysphagia with CTA showing a dilated fluid-filled esophagus with ingested tablets.  Her husband stated she has had difficulty swallowing food for the past year which is progressively worsened.  He stated she underwent swallow studies by speech pathology who recommended a pured diet, however, she declined and wished to eat a regular diet.  Her husband stated she would take 1 bite of food followed by a sip of water.  No vomiting.  History of GERD on pantoprazole 40 mg twice daily.  She has not had an EGD.  Her husband also reported she passes a bowel movement once every week or 2 and she requires frequent enemas and intermittent fecal impaction removal by the nursing staff.  He stated she underwent a colonoscopy by a gastroenterologist in [facility] about 1 year ago which was normal.   She was previously admitted to the hospital 03/27/2022 with declining physical status at home with progressive weakness, fever and altered mental status..  Urine cultures grew Klebsiella and Enterobacter treated with antibiotics.  Brain MRI showed new demyelinating lesions, treated with Solu-Medrol and followed by neurology.  She was diagnosed with acute toxic and metabolic encephalopathy likely multifactorial secondary to dehydration, UTI and polypharmacy.  She was transferred to acute care rehab 03/27/2022 then transferred  to rehab 04/03/2022. She was discharged to [facility] skilled nursing facitlty on 05/04/2022.   She underwent a modified barium swallow study by speech pathology while in rehab 04/24/2022, she exhibited significant pharyngeal stasis mostly within the vallecular space and along the lateral channels, which was  unable to be fully cleared despite use of chin tuck and several liquid rinses, oral suctioning ultimately provided to remove. Pt unable to perform volitional throat clear  and/or cough strong enough to clear penetrated and aspirated material.  A pured diet and water only was recommended, however ,she wished to remain on a regular diet with thin liquids despite risk of aspiration and malnutrition.   She was previously admitted to the hospital 03/27/2022 with declining physical status at home with progressive weakness, fever and altered mental status..  Urine cultures grew Klebsiella and Enterobacter treated with antibiotics.  Brain MRI showed new demyelinating lesions, treated with Solu-Medrol and followed by neurology.  She was diagnosed with acute toxic and metabolic encephalopathy likely multifactorial secondary to dehydration, UTI and polypharmacy.  She was transferred to acute care rehab 03/27/2022 then transferred  to rehab 04/03/2022. She was discharged to [facility] skilled nursing facility on 05/04/2022.   She is DNR/DNI.   Past Medical History:  Diagnosis Date   Allergies    Anxiety    Arthritis    Asthma    seasonal - pollen   Constipation    Depression    Dyspnea    with exertion   GERD (gastroesophageal reflux disease)    Headache    History of hiatal hernia    Left radial nerve palsy 03/15/2020   resolved - Left arm/hand weak   Multiple sclerosis ([place])    Pneumonia 2007   x 1   Vision abnormalities    Wears glasses     Past Surgical History:  Procedure Laterality Date   COLONOSCOPY     EXCISION MORTON'S NEUROMA Right    fallopian tube removal     INTRATHECAL PUMP IMPLANT Right 02/18/2019   Procedure: INTRATHECAL PUMP IMPLANT;  Surgeon: Clydell Hakim, MD;  Location: [place];  Service: Neurosurgery;  Laterality: Right;  INTRATHECAL PUMP IMPLANT   INTRATHECAL PUMP IMPLANT Right 02/17/2021   Procedure: Baclofen Pump Replacement;  Surgeon: Erline Levine, MD;  Location: [place];  Service: Neurosurgery;  Laterality: Right;  3C/RM 21   INTRATHECAL PUMP REVISION N/A 01/11/2022   Procedure: Baclofen Pump revision;  Surgeon: Dawley, Theodoro Doing, DO;   Location: [place];  Service: Neurosurgery;  Laterality: N/A;  RM 21 to follow   PAIN PUMP IMPLANTATION N/A 09/28/2021   Procedure: Insertion of baclofen pump, right lower quadrant;  Surgeon: Dawley, Theodoro Doing, DO;  Location: [place];  Service: Neurosurgery;  Laterality: N/A;   PAIN PUMP REMOVAL Left 04/06/2021   Procedure: Removal of baclofen pump, Left;  Surgeon: Dawley, Theodoro Doing, DO;  Location: [place];  Service: Neurosurgery;  Laterality: Left;  Lateral/Left//3C rm 19   UPPER GI ENDOSCOPY     WISDOM TOOTH EXTRACTION     WISDOM TOOTH EXTRACTION      Prior to Admission medications   Medication Sig Start Date End Date Taking? Authorizing Provider  acetaminophen (TYLENOL) 500 MG tablet Take 2 tablets (1,000 mg total) by mouth 3 (three) times daily. 05/04/22  Yes Setzer, Edman Circle, PA-C  acidophilus (RISAQUAD) CAPS capsule Take 1 capsule by mouth 3 (three) times daily. 05/04/22  Yes Setzer, Edman Circle, PA-C  ascorbic acid (VITAMIN C) 500 MG tablet Take 1,000 mg by mouth daily.   Yes [provider]  baclofen (LIORESAL) 20 MG tablet Take 1 tablet (20 mg total) by mouth every 8 (eight) hours. 05/04/22  Yes Setzer, Edman Circle, PA-C  bisacodyl (DULCOLAX) 10 MG suppository Place 1 suppository (10 mg total) rectally  daily as needed for severe constipation. 04/03/22  Yes Danford, Suann Larry, MD  buPROPion (WELLBUTRIN XL) 300 MG 24 hr tablet TAKE 1 TABLET DAILY Patient taking differently: Take 300 mg by mouth daily. 04/20/20  Yes Lovorn, Jinny Blossom, MD  calcium carbonate (TUMS - DOSED IN MG ELEMENTAL CALCIUM) 500 MG chewable tablet Chew 1 tablet (200 mg of elemental calcium total) by mouth 3 (three) times daily as needed for indigestion or heartburn. 03/23/22  Yes Setzer, Edman Circle, PA-C  Cholecalciferol (VITAMIN D-3) 125 MCG (5000 UT) TABS Take 5,000 Units by mouth daily. 04/15/19  Yes Angiulli, Lavon Paganini, PA-C  conjugated estrogens (PREMARIN) vaginal cream Place 1 g vaginally See admin instructions. Apply 1 gram  vaginally every Monday, Wednesday and friday   Yes [provider]  cyanocobalamin 2500 MCG TABS Take 2,500 mcg by mouth daily. 05/05/22  Yes Setzer, Edman Circle, PA-C  dalfampridine 10 MG TB12 Take 1 tablet (10 mg total) by mouth 2 (two) times daily. 05/04/22  Yes Setzer, Edman Circle, PA-C  diazepam (VALIUM) 5 MG tablet Take 1 tablet (5 mg total) by mouth every 8 (eight) hours. 05/04/22  Yes Setzer, Edman Circle, PA-C  docusate sodium (COLACE) 100 MG capsule Take 1 capsule (100 mg total) by mouth at bedtime. 05/04/22  Yes Setzer, Edman Circle, PA-C  fluconazole (DIFLUCAN) 200 MG tablet Take 200 mg by mouth daily.   Yes [provider]  meloxicam (MOBIC) 15 MG tablet Take 1 tablet (15 mg total) by mouth daily. 05/05/22  Yes Setzer, Edman Circle, PA-C  modafinil (PROVIGIL) 100 MG tablet Take 1 tablet (100 mg total) by mouth daily. 05/05/22  Yes Setzer, Edman Circle, PA-C  omeprazole (PRILOSEC) 20 MG capsule Take 20 mg by mouth daily.   Yes [provider]  oxybutynin (DITROPAN-XL) 5 MG 24 hr tablet Take 5 mg by mouth at bedtime.   Yes [provider]  Oxycodone HCl 10 MG TABS Take 10 mg by mouth every 4 (four) hours as needed (pain).   Yes [provider]  pantoprazole (PROTONIX) 40 MG tablet Take 1 tablet (40 mg total) by mouth 2 (two) times daily. 05/04/22  Yes Setzer, Edman Circle, PA-C  polycarbophil (FIBERCON) 625 MG tablet Take 625 mg by mouth daily.   Yes [provider]  polyethylene glycol (MIRALAX / GLYCOLAX) 17 g packet Take 17 g by mouth daily. 05/05/22  Yes Setzer, Edman Circle, PA-C  polyvinyl alcohol (LIQUIFILM TEARS) 1.4 % ophthalmic solution Place 1 drop into both eyes 3 (three) times daily as needed for dry eyes. 05/04/22  Yes Setzer, Edman Circle, PA-C  Psyllium (METAMUCIL) 0.36 g CAPS Take 5 capsules by mouth daily. 05/05/22  Yes Setzer, Edman Circle, PA-C  senna-docusate (SENOKOT-S) 8.6-50 MG tablet Take 2 tablets by mouth daily. 05/05/22  Yes Setzer, Edman Circle, PA-C   traZODone (DESYREL) 100 MG tablet Take 1 tablet (100 mg total) by mouth at bedtime. 05/04/22  Yes Setzer, Edman Circle, PA-C  antiseptic oral rinse (BIOTENE) LIQD 15 mLs by Mouth Rinse route as needed for dry mouth. Patient not taking: Reported on 05/09/2022 05/04/22   Vaughan Basta, Edman Circle, PA-C  fiber (NUTRISOURCE FIBER) PACK packet Take 1 packet by mouth daily. Patient not taking: Reported on 05/02/2022 05/05/22   Barbie Banner, PA-C  lamoTRIgine (LAMICTAL) 25 MG tablet Take one pill daily x 5 days, then one pill twice daily x 5 days, then one pill three times daily x 5 days, then 2 pills twice daily Patient not  taking: Reported on 05/03/2022 05/16/22   Britt Bottom, MD  potassium chloride SA (KLOR-CON M) 20 MEQ tablet Take 1 tablet (20 mEq total) by mouth every other day while taking Lasix Patient not taking: Reported on 05/25/2022 03/23/22   Barbie Banner, PA-C  tapentadol (NUCYNTA) 75 MG tablet Take 1 tablet (75 mg total) by mouth 4 (four) times daily -  before meals and at bedtime. Patient not taking: Reported on 05/15/2022 05/04/22   Barbie Banner, PA-C  trospium (SANCTURA) 20 MG tablet Take 1 tablet (20 mg total) by mouth 2 (two) times daily. Patient not taking: Reported on 05/26/2022 05/04/22   Barbie Banner, PA-C    Current Facility-Administered Medications  Medication Dose Route Frequency Provider Last Rate Last Admin   acetaminophen (TYLENOL) tablet 650 mg  650 mg Oral Q6H PRN Adefeso, Oladapo, DO       Or   acetaminophen (TYLENOL) suppository 650 mg  650 mg Rectal Q6H PRN Adefeso, Oladapo, DO   650 mg at 21-Jun-2022 0737   Ampicillin-Sulbactam (UNASYN) 3 g in sodium chloride 0.9 % 100 mL IVPB  3 g Intravenous Q8H Franky Macho, RPH   Stopped at Jun 21, 2022 0221   docusate sodium (COLACE) capsule 100 mg  100 mg Oral QHS Adefeso, Oladapo, DO       enoxaparin (LOVENOX) injection 40 mg  40 mg Subcutaneous Daily Adefeso, Oladapo, DO       lactated ringers infusion   Intravenous  Continuous Adefeso, Oladapo, DO 150 mL/hr at 06/21/2022 0736 New Bag at 06/21/22 0736   ondansetron (ZOFRAN) tablet 4 mg  4 mg Oral Q6H PRN Adefeso, Oladapo, DO       Or   ondansetron (ZOFRAN) injection 4 mg  4 mg Intravenous Q6H PRN Adefeso, Oladapo, DO       pantoprazole (PROTONIX) injection 40 mg  40 mg Intravenous Q24H Adefeso, Oladapo, DO       vancomycin (VANCOREADY) IVPB 750 mg/150 mL  750 mg Intravenous Q24H Adefeso, Oladapo, DO       Current Outpatient Medications  Medication Sig Dispense Refill   acetaminophen (TYLENOL) 500 MG tablet Take 2 tablets (1,000 mg total) by mouth 3 (three) times daily. 30 tablet 0   acidophilus (RISAQUAD) CAPS capsule Take 1 capsule by mouth 3 (three) times daily.     ascorbic acid (VITAMIN C) 500 MG tablet Take 1,000 mg by mouth daily.     baclofen (LIORESAL) 20 MG tablet Take 1 tablet (20 mg total) by mouth every 8 (eight) hours. 30 each 0   bisacodyl (DULCOLAX) 10 MG suppository Place 1 suppository (10 mg total) rectally daily as needed for severe constipation. 12 suppository 0   buPROPion (WELLBUTRIN XL) 300 MG 24 hr tablet TAKE 1 TABLET DAILY (Patient taking differently: Take 300 mg by mouth daily.) 90 tablet 3   calcium carbonate (TUMS - DOSED IN MG ELEMENTAL CALCIUM) 500 MG chewable tablet Chew 1 tablet (200 mg of elemental calcium total) by mouth 3 (three) times daily as needed for indigestion or heartburn.     Cholecalciferol (VITAMIN D-3) 125 MCG (5000 UT) TABS Take 5,000 Units by mouth daily. 30 tablet 1   conjugated estrogens (PREMARIN) vaginal cream Place 1 g vaginally See admin instructions. Apply 1 gram vaginally every Monday, Wednesday and friday     cyanocobalamin 2500 MCG TABS Take 2,500 mcg by mouth daily.     dalfampridine 10 MG TB12 Take 1 tablet (10 mg total) by mouth 2 (two)  times daily. 60 tablet    diazepam (VALIUM) 5 MG tablet Take 1 tablet (5 mg total) by mouth every 8 (eight) hours. 30 tablet 0   docusate sodium (COLACE) 100 MG  capsule Take 1 capsule (100 mg total) by mouth at bedtime. 10 capsule 0   fluconazole (DIFLUCAN) 200 MG tablet Take 200 mg by mouth daily.     meloxicam (MOBIC) 15 MG tablet Take 1 tablet (15 mg total) by mouth daily.     modafinil (PROVIGIL) 100 MG tablet Take 1 tablet (100 mg total) by mouth daily.     omeprazole (PRILOSEC) 20 MG capsule Take 20 mg by mouth daily.     oxybutynin (DITROPAN-XL) 5 MG 24 hr tablet Take 5 mg by mouth at bedtime.     Oxycodone HCl 10 MG TABS Take 10 mg by mouth every 4 (four) hours as needed (pain).     pantoprazole (PROTONIX) 40 MG tablet Take 1 tablet (40 mg total) by mouth 2 (two) times daily.     polycarbophil (FIBERCON) 625 MG tablet Take 625 mg by mouth daily.     polyethylene glycol (MIRALAX / GLYCOLAX) 17 g packet Take 17 g by mouth daily. 14 each 0   polyvinyl alcohol (LIQUIFILM TEARS) 1.4 % ophthalmic solution Place 1 drop into both eyes 3 (three) times daily as needed for dry eyes. 15 mL 0   Psyllium (METAMUCIL) 0.36 g CAPS Take 5 capsules by mouth daily.     senna-docusate (SENOKOT-S) 8.6-50 MG tablet Take 2 tablets by mouth daily.     traZODone (DESYREL) 100 MG tablet Take 1 tablet (100 mg total) by mouth at bedtime.     antiseptic oral rinse (BIOTENE) LIQD 15 mLs by Mouth Rinse route as needed for dry mouth. (Patient not taking: Reported on 05/03/2022)     fiber (NUTRISOURCE FIBER) PACK packet Take 1 packet by mouth daily. (Patient not taking: Reported on 05/07/2022)     lamoTRIgine (LAMICTAL) 25 MG tablet Take one pill daily x 5 days, then one pill twice daily x 5 days, then one pill three times daily x 5 days, then 2 pills twice daily (Patient not taking: Reported on 05/18/2022) 120 tablet 5   potassium chloride SA (KLOR-CON M) 20 MEQ tablet Take 1 tablet (20 mEq total) by mouth every other day while taking Lasix (Patient not taking: Reported on 05/11/2022) 15 tablet 0   tapentadol (NUCYNTA) 75 MG tablet Take 1 tablet (75 mg total) by mouth 4 (four)  times daily -  before meals and at bedtime. (Patient not taking: Reported on 05/08/2022) 15 tablet 0   trospium (SANCTURA) 20 MG tablet Take 1 tablet (20 mg total) by mouth 2 (two) times daily. (Patient not taking: Reported on 05/15/2022)      Allergies as of 05/07/2022 - Review Complete 05/01/2022  Allergen Reaction Noted   Ziconotide acetate Shortness Of Breath 07/12/2020   Morphine Other (See Comments) 01/21/2017   Amoxicillin Nausea And Vomiting 02/13/2021   Dantrolene Other (See Comments) 03/13/2022   Lamotrigine Other (See Comments) 03/13/2022   Lyrica [pregabalin] Nausea And Vomiting 12/30/2019    Family History  Problem Relation Age of Onset   Diabetes Mother    Healthy Brother     Social History   Socioeconomic History   Marital status: Married    Spouse name: Shanon Brow   Number of children: Not on file   Years of education: Not on file   Highest education level: Not on file  Occupational  History   Not on file  Tobacco Use   Smoking status: Every Day    Packs/day: 0.50    Years: 32.00    Total pack years: 16.00    Types: Cigarettes    Start date: 1988   Smokeless tobacco: Never  Vaping Use   Vaping Use: Former   Quit date: 05/29/2011  Substance and Sexual Activity   Alcohol use: Not Currently    Comment: 2-3 times per wk   Drug use: Never   Sexual activity: Not Currently    Birth control/protection: Post-menopausal  Other Topics Concern   Not on file  Social History Narrative   Right handed    Caffeine: 1 cup or less per day- coffee   [employer]   Social Determinants of Health   Financial Resource Strain: Not on file  Food Insecurity: No Food Insecurity (04/03/2019)   Hunger Vital Sign    Worried About Running Out of Food in the Last Year: Never true    Ran Out of Food in the Last Year: Never true  Transportation Needs: No Transportation Needs (04/03/2019)   PRAPARE - Hydrologist (Medical): No    Lack of Transportation  (Non-Medical): No  Physical Activity: Inactive (04/03/2019)   Exercise Vital Sign    Days of Exercise per Week: 0 days    Minutes of Exercise per Session: 0 min  Stress: No Stress Concern Present (04/03/2019)   [place]    Feeling of Stress : Not at all  Social Connections: Not on file  Intimate Partner Violence: Not At Risk (04/03/2019)   Humiliation, Afraid, Rape, and Kick questionnaire    Fear of Current or Ex-Partner: No    Emotionally Abused: No    Physically Abused: No    Sexually Abused: No   Review of Systems: Limited review of systems as patient is nonverbal.  See HPI.  Physical Exam: Vital signs in last 24 hours: Temp:  [98.5 F (36.9 C)-101.4 F (38.6 C)] 101.3 F (38.5 C) (12/31 0739) Pulse Rate:  [97-119] 119 (12/31 0700) Resp:  [16-25] 24 (12/31 0700) BP: (85-118)/(54-82) 115/82 (12/31 0700) SpO2:  [92 %-99 %] 92 % (12/31 0700) Weight:  [54 kg] 54 kg (12/30 1433)   General: Critically ill cachectic appearing 53 year old female. Head:  Normocephalic and atraumatic. Eyes: Pupils are moderately dilated, sluggish to react.  No scleral icterus. Conjunctiva pink. Ears:  Normal auditory acuity. Nose:  No deformity, discharge or lesions. Mouth: Poor dentition.  No ulcers or lesions.  Neck:  Supple. No lymphadenopathy or thyromegaly.  Lungs: Breath sounds clear throughout. No wheezes, rhonchi or crackles.  Heart: Tachycardic, no murmurs. Abdomen: Grossly distended and firm, hypoactive bowel sounds to all 4 quadrants.  No overt signs of rebound or guarding. Rectal: Hard fecal impaction, brown hard stool in rectal vault.  Medical technician present during exam. Musculoskeletal:  Symmetrical without gross deformities.  Pulses:  Normal pulses noted. Extremities: Right and left feet are cold to touch, slightly mottled appearance.  DP pulses positive per Doppler, performed by nursing staff at the bedside at  this time. Neurologic: Spontaneous movement to the right upper extremity.  He squeezes my right hand otherwise does not follow commands.  Left extremity is flaccid. Skin: Sacral decubitus dressing intact.  Intake/Output from previous day: 12/30 0701 - 12/31 0700 In: 100.1 [IV Piggyback:100.1] Out: -  Intake/Output this shift: No intake/output data recorded.  Lab Results: Recent  Labs    05/02/2022 1336 06-18-22 0721  WBC 9.7 10.2  HGB 12.7 14.0  HCT 38.9 42.3  PLT 303 375   BMET Recent Labs    05/20/2022 1336  NA 136  K 4.9  CL 105  CO2 18*  GLUCOSE 131*  BUN 49*  CREATININE 1.18*  CALCIUM 8.9   LFT Recent Labs    05/22/2022 1336  PROT 5.2*  ALBUMIN 2.4*  AST 15  ALT 11  ALKPHOS 71  BILITOT 0.6   PT/INR Recent Labs    05/01/2022 1336  LABPROT 14.9  INR 1.2   Hepatitis Panel No results for input(s): "HEPBSAG", "HCVAB", "HEPAIGM", "HEPBIGM" in the last 72 hours.    Studies/Results: CT ABDOMEN PELVIS W CONTRAST  Result Date: 05/09/2022 CLINICAL DATA:  Concern for pulmonary embolus. Bowel obstruction suspected. EXAM: CT ANGIOGRAPHY CHEST CT ABDOMEN AND PELVIS WITH CONTRAST TECHNIQUE: Multidetector CT imaging of the chest was performed using the standard protocol during bolus administration of intravenous contrast. Multiplanar CT image reconstructions and MIPs were obtained to evaluate the vascular anatomy. Multidetector CT imaging of the abdomen and pelvis was performed using the standard protocol during bolus administration of intravenous contrast. RADIATION DOSE REDUCTION: This exam was performed according to the departmental dose-optimization program which includes automated exposure control, adjustment of the mA and/or kV according to patient size and/or use of iterative reconstruction technique. CONTRAST:  47mL OMNIPAQUE IOHEXOL 350 MG/ML SOLN COMPARISON:  Multiple priors including chest CT July 17, 2021 and CT abdomen August 24, 2021. FINDINGS: CTA CHEST  FINDINGS Cardiovascular: Satisfactory opacification of the pulmonary arteries to the segmental level. No evidence of pulmonary embolism. Normal caliber thoracic aorta. Mild cardiac enlargement. No pericardial effusion. Mediastinum/Nodes: Dilated esophagus filled with fluid and ingested tablets. No pathologically enlarged mediastinal, hilar or axillary lymph nodes. The esophagus is grossly unremarkable. Lungs/Pleura: Diffuse interstitial opacities with interposed ground-glass opacities. Consolidations in the dependent bilateral lower lobes. Left lower lobe pneumatocele. No pleural effusion. No pneumothorax. Musculoskeletal: No acute osseous abnormality. Review of the MIP images confirms the above findings. CT ABDOMEN and PELVIS FINDINGS Hepatobiliary: No suspicious hepatic lesion. Gallbladder is distended without wall thickening. Prominence of the common bile duct measuring upper limits of normal at 6 mm. Pancreas: No pancreatic ductal dilation or evidence of acute inflammation. Spleen: No splenomegaly or focal splenic lesion. Adrenals/Urinary Tract: Bilateral adrenal glands appear normal. No hydronephrosis. Subcentimeter hypodense left renal lesion is technically too small to accurately characterize but statistically likely to reflect a cyst and considered benign requiring no independent imaging follow-up. Urinary bladder is decompressed around a Foley catheter. Stomach/Bowel: Stomach is minimally distended limiting evaluation. No pathologic dilation of small bowel. The appendix is not confidently identified in its entirety however there is no pericecal inflammation. Large volume of stool fills the colon with the cecum measuring 8.6 cm in diameter. Large volume of stool in the rectal vault with mild rectal wall thickening and adjacent stranding. Vascular/Lymphatic: Aortic atherosclerosis. Normal caliber abdominal aorta. The portal, splenic and superior mesenteric veins are patent. IVC is slightly compressed. No  pathologic dilation of small or large bowel. Reproductive: No acute abnormality. Other: No significant abdominopelvic free fluid. Musculoskeletal: Multilevel degenerative changes spine. No acute osseous abnormality. Review of the MIP images confirms the above findings. IMPRESSION: CTA CHEST: 1. No evidence of pulmonary embolism. 2. Diffuse interstitial opacities with interposed ground-glass opacities, may reflect pulmonary edema or atypical infection. 3. Consolidations in the dependent bilateral lower lobes, suspicious for aspiration versus atelectasis. 4. Dilated  esophagus filled with fluid and ingested tablets. CT ABDOMEN/PELVIS: 1. Very large volume of stool throughout dilated colon with stool in the rectal vault and mild rectal wall thickening with trace adjacent stranding. Findings are compatible with severe constipation and possible stercoral colitis. 2. Gallbladder is distended without wall thickening. Prominence of the common bile duct measuring upper limits of normal at 6 mm. Recommend correlation with LFTs. 3.  Aortic Atherosclerosis (ICD10-I70.0). Electronically Signed   By: Dahlia Bailiff M.D.   On: 05/01/2022 17:55   CT Angio Chest PE W and/or Wo Contrast  Result Date: 05/02/2022 CLINICAL DATA:  Concern for pulmonary embolus. Bowel obstruction suspected. EXAM: CT ANGIOGRAPHY CHEST CT ABDOMEN AND PELVIS WITH CONTRAST TECHNIQUE: Multidetector CT imaging of the chest was performed using the standard protocol during bolus administration of intravenous contrast. Multiplanar CT image reconstructions and MIPs were obtained to evaluate the vascular anatomy. Multidetector CT imaging of the abdomen and pelvis was performed using the standard protocol during bolus administration of intravenous contrast. RADIATION DOSE REDUCTION: This exam was performed according to the departmental dose-optimization program which includes automated exposure control, adjustment of the mA and/or kV according to patient size  and/or use of iterative reconstruction technique. CONTRAST:  47mL OMNIPAQUE IOHEXOL 350 MG/ML SOLN COMPARISON:  Multiple priors including chest CT July 17, 2021 and CT abdomen August 24, 2021. FINDINGS: CTA CHEST FINDINGS Cardiovascular: Satisfactory opacification of the pulmonary arteries to the segmental level. No evidence of pulmonary embolism. Normal caliber thoracic aorta. Mild cardiac enlargement. No pericardial effusion. Mediastinum/Nodes: Dilated esophagus filled with fluid and ingested tablets. No pathologically enlarged mediastinal, hilar or axillary lymph nodes. The esophagus is grossly unremarkable. Lungs/Pleura: Diffuse interstitial opacities with interposed ground-glass opacities. Consolidations in the dependent bilateral lower lobes. Left lower lobe pneumatocele. No pleural effusion. No pneumothorax. Musculoskeletal: No acute osseous abnormality. Review of the MIP images confirms the above findings. CT ABDOMEN and PELVIS FINDINGS Hepatobiliary: No suspicious hepatic lesion. Gallbladder is distended without wall thickening. Prominence of the common bile duct measuring upper limits of normal at 6 mm. Pancreas: No pancreatic ductal dilation or evidence of acute inflammation. Spleen: No splenomegaly or focal splenic lesion. Adrenals/Urinary Tract: Bilateral adrenal glands appear normal. No hydronephrosis. Subcentimeter hypodense left renal lesion is technically too small to accurately characterize but statistically likely to reflect a cyst and considered benign requiring no independent imaging follow-up. Urinary bladder is decompressed around a Foley catheter. Stomach/Bowel: Stomach is minimally distended limiting evaluation. No pathologic dilation of small bowel. The appendix is not confidently identified in its entirety however there is no pericecal inflammation. Large volume of stool fills the colon with the cecum measuring 8.6 cm in diameter. Large volume of stool in the rectal vault with mild  rectal wall thickening and adjacent stranding. Vascular/Lymphatic: Aortic atherosclerosis. Normal caliber abdominal aorta. The portal, splenic and superior mesenteric veins are patent. IVC is slightly compressed. No pathologic dilation of small or large bowel. Reproductive: No acute abnormality. Other: No significant abdominopelvic free fluid. Musculoskeletal: Multilevel degenerative changes spine. No acute osseous abnormality. Review of the MIP images confirms the above findings. IMPRESSION: CTA CHEST: 1. No evidence of pulmonary embolism. 2. Diffuse interstitial opacities with interposed ground-glass opacities, may reflect pulmonary edema or atypical infection. 3. Consolidations in the dependent bilateral lower lobes, suspicious for aspiration versus atelectasis. 4. Dilated esophagus filled with fluid and ingested tablets. CT ABDOMEN/PELVIS: 1. Very large volume of stool throughout dilated colon with stool in the rectal vault and mild rectal wall thickening with trace  adjacent stranding. Findings are compatible with severe constipation and possible stercoral colitis. 2. Gallbladder is distended without wall thickening. Prominence of the common bile duct measuring upper limits of normal at 6 mm. Recommend correlation with LFTs. 3.  Aortic Atherosclerosis (ICD10-I70.0). Electronically Signed   By: Dahlia Bailiff M.D.   On: 05/08/2022 17:55   DG Abd Portable 1 View  Result Date: 05/03/2022 CLINICAL DATA:  Possible sepsis. EXAM: PORTABLE ABDOMEN - 1 VIEW COMPARISON:  03/22/2022 FINDINGS: Again demonstrated is a large amount of stool throughout the:. A right abdominal battery pack is again demonstrated. No acute bony abnormality. IMPRESSION: Large stool burden. Electronically Signed   By: Claudie Revering M.D.   On: 05/21/2022 14:52   DG Chest Portable 1 View  Result Date: 05/14/2022 CLINICAL DATA:  Sepsis. EXAM: PORTABLE CHEST 1 VIEW COMPARISON:  Abdomen radiographs obtained at the same time. Chest radiographs  dated 03/24/2022. Chest CT dated 07/17/2021. FINDINGS: The cardiac silhouette remains at the upper limit of normal in size, accentuated by poor inspiration and mild elevation of the right hemidiaphragm. Minimal right basilar atelectasis with improvement. Otherwise, clear lungs. Diffuse osteopenia. IMPRESSION: Minimal right basilar atelectasis with improvement. Electronically Signed   By: Claudie Revering M.D.   On: 05/14/2022 14:47    IMPRESSION/PLAN:  23) 52 year old female with progressive Multiple Sclerosis readmitted to the hospital 05/25/2022 with altered mental status, generalized weakness and fever with suspected urosepsis.  -Evaluation/management per the medical service  2) Severe oropharyngeal dysphagia at high risk for aspiration.  CTA showed a dilated fluid-filled esophagus with ingested tablets.  Modified barium swallow study per speech pathologist 05/24/2022 identified significant pharyngeal stasis, a pured diet was recommended, however, the patient wished to remain on a regular diet.  She has never undergone an EGD. -PPI IV daily -NPO as patient is not medically stable to attempt op intake at this juncture -No plans for EGD at this time, will need to establish advanced directives and goal of care.  -Palliative care consult quested.  3) Severe constipation/obstipation/fecal impaction multifactorial: Progressive MS, decreased mobility and chronic pain meds. CTAP 05/15/2022 showed a very large volume of stool throughout the dilated colon with stool in the rectal vault with mild rectal wall thickening concerning for stercoral colitis.  At risk for bowel perforation. -Smog enema x 1  -She will likely require manual fecal disimpaction after smog enema administered -No plans for endoscopic intervention at this time -Await further recommendations per Dr. Lorenso Courier  4) Possible aspiration pneumonia, on antibiotics. Chest CTA without PE and showed diffuse interstitial opacities with interspersed  groundglass opacities, possibly reflecting pulmonary edema or atypical infection.  Consolidations in the dependent bilateral lower lobes also suspicious for aspiration versus atelectasis.    5) Colon cancer screening.  Husband reported she underwent a colonoscopy by GI in [place] about 1 year ago which was normal, recall colonoscopy 10 years.     Patrecia Pour Kennedy-Smith  05/31/22, 11:22PM

## 2022-05-28 NOTE — ED Notes (Signed)
Admitting provider given update that the pt's HR continues to increase despite return to normothermia

## 2022-05-28 NOTE — Progress Notes (Addendum)
PROGRESS NOTE    Sharon Odom  TDS:[id number] DOB: January 23, 1971 DOA: 05/08/2022 PCP: Oren Section, NP-C   Brief Narrative:  Sharon Odom is a 52 y.o. female with medical history significant of multiple sclerosis, neurogenic bladder, chronic indwelling Foley catheter, bilateral spasticity ( L > R ) with subsequent application of intrathecal pump for baclofen who presents to the emergency department via EMS from [facility] SN[facility] due to fever.  Patient was unable to provide history due to being nonverbal at baseline.  Patient was admitted for sepsis secondary to CAUTI POA and possible aspiration pneumonia.  She is also noted to have some dehydration with AKI.  She has empirically been started on vancomycin and Unasyn based on prior urine cultures.  She continues to have ongoing sinus tachycardia despite aggressive IV fluid.  GI following for evaluation of dysphagia as well as fecal impaction with plans for smog enema and disimpaction later today.   Assessment & Plan:   Active Problems:   Dysphagia   Multiple sclerosis (HCC)   Neurogenic bladder   S/P insertion of intrathecal pump   Sepsis secondary to UTI ([place])   AKI (acute kidney injury) ([place])   Aspiration pneumonia (HCC)   Constipation   GERD (gastroesophageal reflux disease)   Decubitus ulcer of sacral area  Assessment and Plan:  Sepsis secondary to CAUTI POA and possible aspiration pneumonia Patient met sepsis criteria due to being febrile, tachycardic and tachypneic (met SIRS criteria), source of infection appears to be UTI as well as aspiration  Dilated esophagus filled with fluid and ingested tablets was noted on CT angiogram of chest which may have been the source for aspiration Urine culture that was done on 11/27 was positive for Enterococcus faecium Urine culture that was done on 11/14 was positive for E. coli and this was sensitive to cefepime Patient was started on cefepime, Flagyl and vancomycin, we shall continue  with Unasyn and vancomycin at this time with plan to de-escalate/discontinue based on blood culture, sputum culture, urine Legionella, strep pneumo and procalcitonin noted to be 2.9 Continue Tylenol as needed Continue incentive spirometry, flutter valve  Appreciate GI evaluation for dysphagia 12/31 Sinus tachycardia noted to be related to fever that is ongoing, Tylenol being given as needed, plan to check TSH as well.  No suspicion of PE   Neurogenic bladder and chronic indwelling catheter Continue management as described above Foley catheter will be changed   Acute kidney injury-resolving Dehydration BUN/creatinine 49/1.8 on admission (baseline creatinine at 0.7-0.8) Continue IV hydration Continue to monitor a.m. labs   Dysphagia CT angiography of chest showed dilated esophagus filled with fluid and ingested tablets This may be due to dysphagia possibly secondary to reflux/esophageal motility disorder.   Appreciate GI evaluation   Constipation CT abdomen pelvis was suggestive of constipation/stercoral colitis Smog enema per GI as well as fecal disimpaction later today   Sacral decubitus Continue wound care   Multiple sclerosis Dalfampridine will be held at this time due to patient currently having UTI Bilateral spasticity with intrathecal baclofen pump   GERD Continue Protonix    DVT prophylaxis: Lovenox Code Status: DNR Family Communication: None at bedside Disposition Plan:  Status is: Inpatient Remains inpatient appropriate because: IV medications   Consultants:  GI Palliative  Procedures:  None  Antimicrobials:  Anti-infectives (From admission, onward)    Start     Dose/Rate Route Frequency Ordered Stop   10-Jun-2022 1600  vancomycin (VANCOREADY) IVPB 750 mg/150 mL  750 mg 150 mL/hr over 60 Minutes Intravenous Every 24 hours 05/08/2022 1539     06-21-2022 0400  ceFEPIme (MAXIPIME) 2 g in sodium chloride 0.9 % 100 mL IVPB  Status:  Discontinued        2  g 200 mL/hr over 30 Minutes Intravenous Every 12 hours 05/13/2022 1539 05/07/2022 2219   05/02/2022 2245  Ampicillin-Sulbactam (UNASYN) 3 g in sodium chloride 0.9 % 100 mL IVPB        3 g 200 mL/hr over 30 Minutes Intravenous Every 8 hours 04/28/2022 2239     05/04/2022 1445  ceFEPIme (MAXIPIME) 2 g in sodium chloride 0.9 % 100 mL IVPB        2 g 200 mL/hr over 30 Minutes Intravenous  Once 05/21/2022 1435 05/01/2022 1602   05/01/2022 1445  metroNIDAZOLE (FLAGYL) IVPB 500 mg        500 mg 100 mL/hr over 60 Minutes Intravenous  Once 04/30/2022 1435 05/01/2022 1624   05/02/2022 1445  vancomycin (VANCOCIN) IVPB 1000 mg/200 mL premix        1,000 mg 200 mL/hr over 60 Minutes Intravenous  Once 05/25/2022 1435 05/09/2022 1621      Subjective: Patient seen and evaluated today with no acute events noted since admission.  She continues to remain tachycardic she is febrile this morning.  Nonverbal.  Objective: Vitals:   06/21/22 0222 2022-06-21 0600 2022/06/21 0628 06/21/22 0700  BP:  113/82  115/82  Pulse:  (!) 119  (!) 119  Resp: (!) 22  (!) 25   Temp:      TempSrc:      SpO2:  92%  92%  Weight:        Intake/Output Summary (Last 24 hours) at 06-21-2022 6812 Last data filed at 05/08/2022 1602 Gross per 24 hour  Intake 100.05 ml  Output --  Net 100.05 ml   Filed Weights   05/10/2022 1433  Weight: 54 kg    Examination:  General exam: Appears to be in no acute distress Respiratory system: Clear to auscultation. Respiratory effort normal.  Room air Cardiovascular system: S1 & S2 heard, RRR.  Tachycardic Gastrointestinal system: Abdomen is soft, intrathecal baclofen pump present Central nervous system: Alert and awake, unresponsive to questioning Extremities: No edema Skin: No significant lesions noted Psychiatry: Cannot be evaluated    Data Reviewed: I have personally reviewed following labs and imaging studies  CBC: Recent Labs  Lab 05/14/2022 1336  WBC 9.7  NEUTROABS 9.2*  HGB 12.7  HCT 38.9   MCV 98.2  PLT 751   Basic Metabolic Panel: Recent Labs  Lab 05/18/2022 1336  NA 136  K 4.9  CL 105  CO2 18*  GLUCOSE 131*  BUN 49*  CREATININE 1.18*  CALCIUM 8.9   GFR: Estimated Creatinine Clearance: 48.6 mL/min (A) (by C-G formula based on SCr of 1.18 mg/dL (H)). Liver Function Tests: Recent Labs  Lab 05/13/2022 1336  AST 15  ALT 11  ALKPHOS 71  BILITOT 0.6  PROT 5.2*  ALBUMIN 2.4*   No results for input(s): "LIPASE", "AMYLASE" in the last 168 hours. No results for input(s): "AMMONIA" in the last 168 hours. Coagulation Profile: Recent Labs  Lab 05/25/2022 1336  INR 1.2   Cardiac Enzymes: No results for input(s): "CKTOTAL", "CKMB", "CKMBINDEX", "TROPONINI" in the last 168 hours. BNP (last 3 results) No results for input(s): "PROBNP" in the last 8760 hours. HbA1C: No results for input(s): "HGBA1C" in the last 72 hours. CBG: No results for  input(s): "GLUCAP" in the last 168 hours. Lipid Profile: No results for input(s): "CHOL", "HDL", "LDLCALC", "TRIG", "CHOLHDL", "LDLDIRECT" in the last 72 hours. Thyroid Function Tests: No results for input(s): "TSH", "T4TOTAL", "FREET4", "T3FREE", "THYROIDAB" in the last 72 hours. Anemia Panel: No results for input(s): "VITAMINB12", "FOLATE", "FERRITIN", "TIBC", "IRON", "RETICCTPCT" in the last 72 hours. Sepsis Labs: Recent Labs  Lab 04/28/2022 1336 04/29/2022 1547 Jun 20, 2022 0337  PROCALCITON  --   --  2.90  LATICACIDVEN 1.4 1.9  --     Recent Results (from the past 240 hour(s))  Culture, blood (Routine x 2)     Status: None (Preliminary result)   Collection Time: 05/08/2022  1:35 PM   Specimen: BLOOD  Result Value Ref Range Status   Specimen Description BLOOD SITE NOT SPECIFIED  Final   Special Requests   Final    BOTTLES DRAWN AEROBIC AND ANAEROBIC Blood Culture results may not be optimal due to an inadequate volume of blood received in culture bottles   Culture   Final    NO GROWTH < 24 HOURS Performed at [facility], [place] [address], [place], [place] [address]    Report Status PENDING  Incomplete  Culture, blood (Routine x 2)     Status: None (Preliminary result)   Collection Time: 05/07/2022  1:35 PM   Specimen: BLOOD  Result Value Ref Range Status   Specimen Description BLOOD SITE NOT SPECIFIED  Final   Special Requests   Final    BOTTLES DRAWN AEROBIC AND ANAEROBIC Blood Culture adequate volume   Culture   Final    NO GROWTH < 24 HOURS Performed at [facility], [place] [address] [place], [place] [address]    Report Status PENDING  Incomplete  Resp panel by RT-PCR (RSV, Flu A&B, Covid) Anterior Nasal Swab     Status: None   Collection Time: 05/08/2022  1:44 PM   Specimen: Anterior Nasal Swab  Result Value Ref Range Status   SARS Coronavirus 2 by RT PCR NEGATIVE NEGATIVE Final    Comment: (NOTE) SARS-CoV-2 target nucleic acids are NOT DETECTED.  The SARS-CoV-2 RNA is generally detectable in upper respiratory specimens during the acute phase of infection. The lowest concentration of SARS-CoV-2 viral copies this assay can detect is 138 copies/mL. A negative result does not preclude SARS-Cov-2 infection and should not be used as the sole basis for treatment or other patient management decisions. A negative result may occur with  improper specimen collection/handling, submission of specimen other than nasopharyngeal swab, presence of viral mutation(s) within the areas targeted by this assay, and inadequate number of viral copies(<138 copies/mL). A negative result must be combined with clinical observations, patient history, and epidemiological information. The expected result is Negative.  Fact Sheet for Patients:  EntrepreneurPulse.com.au  Fact Sheet for Healthcare Providers:  IncredibleEmployment.be  This test is no t yet approved or cleared by the [place] FDA and  has been authorized for detection and/or diagnosis of SARS-CoV-2 by FDA  under an Emergency Use Authorization (EUA). This EUA will remain  in effect (meaning this test can be used) for the duration of the COVID-19 declaration under Section 564(b)(1) of the Act, 21 U.S.C.section 360bbb-3(b)(1), unless the authorization is terminated  or revoked sooner.       Influenza A by PCR NEGATIVE NEGATIVE Final   Influenza B by PCR NEGATIVE NEGATIVE Final    Comment: (NOTE) The Xpert Xpress SARS-CoV-2/FLU/RSV plus assay is intended as an aid in  the diagnosis of influenza from Nasopharyngeal swab specimens and should not be used as a sole basis for treatment. Nasal washings and aspirates are unacceptable for Xpert Xpress SARS-CoV-2/FLU/RSV testing.  Fact Sheet for Patients: EntrepreneurPulse.com.au  Fact Sheet for Healthcare Providers: IncredibleEmployment.be  This test is not yet approved or cleared by the [place] FDA and has been authorized for detection and/or diagnosis of SARS-CoV-2 by FDA under an Emergency Use Authorization (EUA). This EUA will remain in effect (meaning this test can be used) for the duration of the COVID-19 declaration under Section 564(b)(1) of the Act, 21 U.S.C. section 360bbb-3(b)(1), unless the authorization is terminated or revoked.     Resp Syncytial Virus by PCR NEGATIVE NEGATIVE Final    Comment: (NOTE) Fact Sheet for Patients: EntrepreneurPulse.com.au  Fact Sheet for Healthcare Providers: IncredibleEmployment.be  This test is not yet approved or cleared by the [place] FDA and has been authorized for detection and/or diagnosis of SARS-CoV-2 by FDA under an Emergency Use Authorization (EUA). This EUA will remain in effect (meaning this test can be used) for the duration of the COVID-19 declaration under Section 564(b)(1) of the Act, 21 U.S.C. section 360bbb-3(b)(1), unless the authorization is terminated or revoked.  Performed at [facility], [place] [address] [place], [place] [address]          Radiology Studies: CT ABDOMEN PELVIS W CONTRAST  Result Date: 05/19/2022 CLINICAL DATA:  Concern for pulmonary embolus. Bowel obstruction suspected. EXAM: CT ANGIOGRAPHY CHEST CT ABDOMEN AND PELVIS WITH CONTRAST TECHNIQUE: Multidetector CT imaging of the chest was performed using the standard protocol during bolus administration of intravenous contrast. Multiplanar CT image reconstructions and MIPs were obtained to evaluate the vascular anatomy. Multidetector CT imaging of the abdomen and pelvis was performed using the standard protocol during bolus administration of intravenous contrast. RADIATION DOSE REDUCTION: This exam was performed according to the departmental dose-optimization program which includes automated exposure control, adjustment of the mA and/or kV according to patient size and/or use of iterative reconstruction technique. CONTRAST:  68m OMNIPAQUE IOHEXOL 350 MG/ML SOLN COMPARISON:  Multiple priors including chest CT July 17, 2021 and CT abdomen August 24, 2021. FINDINGS: CTA CHEST FINDINGS Cardiovascular: Satisfactory opacification of the pulmonary arteries to the segmental level. No evidence of pulmonary embolism. Normal caliber thoracic aorta. Mild cardiac enlargement. No pericardial effusion. Mediastinum/Nodes: Dilated esophagus filled with fluid and ingested tablets. No pathologically enlarged mediastinal, hilar or axillary lymph nodes. The esophagus is grossly unremarkable. Lungs/Pleura: Diffuse interstitial opacities with interposed ground-glass opacities. Consolidations in the dependent bilateral lower lobes. Left lower lobe pneumatocele. No pleural effusion. No pneumothorax. Musculoskeletal: No acute osseous abnormality. Review of the MIP images confirms the above findings. CT ABDOMEN and PELVIS FINDINGS Hepatobiliary: No suspicious hepatic lesion. Gallbladder is distended without wall thickening. Prominence of  the common bile duct measuring upper limits of normal at 6 mm. Pancreas: No pancreatic ductal dilation or evidence of acute inflammation. Spleen: No splenomegaly or focal splenic lesion. Adrenals/Urinary Tract: Bilateral adrenal glands appear normal. No hydronephrosis. Subcentimeter hypodense left renal lesion is technically too small to accurately characterize but statistically likely to reflect a cyst and considered benign requiring no independent imaging follow-up. Urinary bladder is decompressed around a Foley catheter. Stomach/Bowel: Stomach is minimally distended limiting evaluation. No pathologic dilation of small bowel. The appendix is not confidently identified in its entirety however there is no pericecal inflammation. Large volume of stool fills the colon with the cecum measuring 8.6 cm in diameter. Large  volume of stool in the rectal vault with mild rectal wall thickening and adjacent stranding. Vascular/Lymphatic: Aortic atherosclerosis. Normal caliber abdominal aorta. The portal, splenic and superior mesenteric veins are patent. IVC is slightly compressed. No pathologic dilation of small or large bowel. Reproductive: No acute abnormality. Other: No significant abdominopelvic free fluid. Musculoskeletal: Multilevel degenerative changes spine. No acute osseous abnormality. Review of the MIP images confirms the above findings. IMPRESSION: CTA CHEST: 1. No evidence of pulmonary embolism. 2. Diffuse interstitial opacities with interposed ground-glass opacities, may reflect pulmonary edema or atypical infection. 3. Consolidations in the dependent bilateral lower lobes, suspicious for aspiration versus atelectasis. 4. Dilated esophagus filled with fluid and ingested tablets. CT ABDOMEN/PELVIS: 1. Very large volume of stool throughout dilated colon with stool in the rectal vault and mild rectal wall thickening with trace adjacent stranding. Findings are compatible with severe constipation and possible  stercoral colitis. 2. Gallbladder is distended without wall thickening. Prominence of the common bile duct measuring upper limits of normal at 6 mm. Recommend correlation with LFTs. 3.  Aortic Atherosclerosis (ICD10-I70.0). Electronically Signed   By: Dahlia Bailiff M.D.   On: 05/01/2022 17:55   CT Angio Chest PE W and/or Wo Contrast  Result Date: 05/21/2022 CLINICAL DATA:  Concern for pulmonary embolus. Bowel obstruction suspected. EXAM: CT ANGIOGRAPHY CHEST CT ABDOMEN AND PELVIS WITH CONTRAST TECHNIQUE: Multidetector CT imaging of the chest was performed using the standard protocol during bolus administration of intravenous contrast. Multiplanar CT image reconstructions and MIPs were obtained to evaluate the vascular anatomy. Multidetector CT imaging of the abdomen and pelvis was performed using the standard protocol during bolus administration of intravenous contrast. RADIATION DOSE REDUCTION: This exam was performed according to the departmental dose-optimization program which includes automated exposure control, adjustment of the mA and/or kV according to patient size and/or use of iterative reconstruction technique. CONTRAST:  70m OMNIPAQUE IOHEXOL 350 MG/ML SOLN COMPARISON:  Multiple priors including chest CT July 17, 2021 and CT abdomen August 24, 2021. FINDINGS: CTA CHEST FINDINGS Cardiovascular: Satisfactory opacification of the pulmonary arteries to the segmental level. No evidence of pulmonary embolism. Normal caliber thoracic aorta. Mild cardiac enlargement. No pericardial effusion. Mediastinum/Nodes: Dilated esophagus filled with fluid and ingested tablets. No pathologically enlarged mediastinal, hilar or axillary lymph nodes. The esophagus is grossly unremarkable. Lungs/Pleura: Diffuse interstitial opacities with interposed ground-glass opacities. Consolidations in the dependent bilateral lower lobes. Left lower lobe pneumatocele. No pleural effusion. No pneumothorax. Musculoskeletal: No  acute osseous abnormality. Review of the MIP images confirms the above findings. CT ABDOMEN and PELVIS FINDINGS Hepatobiliary: No suspicious hepatic lesion. Gallbladder is distended without wall thickening. Prominence of the common bile duct measuring upper limits of normal at 6 mm. Pancreas: No pancreatic ductal dilation or evidence of acute inflammation. Spleen: No splenomegaly or focal splenic lesion. Adrenals/Urinary Tract: Bilateral adrenal glands appear normal. No hydronephrosis. Subcentimeter hypodense left renal lesion is technically too small to accurately characterize but statistically likely to reflect a cyst and considered benign requiring no independent imaging follow-up. Urinary bladder is decompressed around a Foley catheter. Stomach/Bowel: Stomach is minimally distended limiting evaluation. No pathologic dilation of small bowel. The appendix is not confidently identified in its entirety however there is no pericecal inflammation. Large volume of stool fills the colon with the cecum measuring 8.6 cm in diameter. Large volume of stool in the rectal vault with mild rectal wall thickening and adjacent stranding. Vascular/Lymphatic: Aortic atherosclerosis. Normal caliber abdominal aorta. The portal, splenic and superior mesenteric veins are patent.  IVC is slightly compressed. No pathologic dilation of small or large bowel. Reproductive: No acute abnormality. Other: No significant abdominopelvic free fluid. Musculoskeletal: Multilevel degenerative changes spine. No acute osseous abnormality. Review of the MIP images confirms the above findings. IMPRESSION: CTA CHEST: 1. No evidence of pulmonary embolism. 2. Diffuse interstitial opacities with interposed ground-glass opacities, may reflect pulmonary edema or atypical infection. 3. Consolidations in the dependent bilateral lower lobes, suspicious for aspiration versus atelectasis. 4. Dilated esophagus filled with fluid and ingested tablets. CT  ABDOMEN/PELVIS: 1. Very large volume of stool throughout dilated colon with stool in the rectal vault and mild rectal wall thickening with trace adjacent stranding. Findings are compatible with severe constipation and possible stercoral colitis. 2. Gallbladder is distended without wall thickening. Prominence of the common bile duct measuring upper limits of normal at 6 mm. Recommend correlation with LFTs. 3.  Aortic Atherosclerosis (ICD10-I70.0). Electronically Signed   By: Dahlia Bailiff M.D.   On: 04/27/2022 17:55   DG Abd Portable 1 View  Result Date: 05/05/2022 CLINICAL DATA:  Possible sepsis. EXAM: PORTABLE ABDOMEN - 1 VIEW COMPARISON:  03/22/2022 FINDINGS: Again demonstrated is a large amount of stool throughout the:. A right abdominal battery pack is again demonstrated. No acute bony abnormality. IMPRESSION: Large stool burden. Electronically Signed   By: Claudie Revering M.D.   On: 04/30/2022 14:52   DG Chest Portable 1 View  Result Date: 05/08/2022 CLINICAL DATA:  Sepsis. EXAM: PORTABLE CHEST 1 VIEW COMPARISON:  Abdomen radiographs obtained at the same time. Chest radiographs dated 03/24/2022. Chest CT dated 07/17/2021. FINDINGS: The cardiac silhouette remains at the upper limit of normal in size, accentuated by poor inspiration and mild elevation of the right hemidiaphragm. Minimal right basilar atelectasis with improvement. Otherwise, clear lungs. Diffuse osteopenia. IMPRESSION: Minimal right basilar atelectasis with improvement. Electronically Signed   By: Claudie Revering M.D.   On: 05/24/2022 14:47        Scheduled Meds:  docusate sodium  100 mg Oral QHS   enoxaparin (LOVENOX) injection  40 mg Subcutaneous Daily   pantoprazole (PROTONIX) IV  40 mg Intravenous Q24H   Continuous Infusions:  ampicillin-sulbactam (UNASYN) IV Stopped (19-Jun-2022 0221)   lactated ringers 150 mL/hr at 05/20/2022 1751   vancomycin       LOS: 1 day    Time spent: 35 minutes    Patriciann Becht D Manuella Ghazi, DO [facility]  If 7PM-7AM, please contact night-coverage [email or web address] 06-19-2022, 7:12 AM

## 2022-05-28 NOTE — Significant Event (Addendum)
Rapid Response Event Note   Reason for Call :  HR 150s restless  Initial Focused Assessment:  Patient is lying in the bed.  She is moving her right arm frequently.  She has increased work of breathing, she is using accessory muscles to breath.  Lung sounds coarse Heart tones regular, rapid Abdomen is distended and firm, hypoactive bowel sounds Pupils 7/brisk right and left She is pale, mottled and diaphoretic.  Her nail beds are purplish  BP 109/80  HR 155  RR 28  O2 sat 95% on [place] BP 193/145-139/105   HR 166  RR 30   Interventions:  ABG drawn Placed patient on Bipap 11/5  Fio2 50%  Palliative care at bedside, spoke with husband at bedside Transition to comfort care 0.5mg  Dilaudid given IV 1mg  Ativan given IV Dilaudid gtt started at 2mg /hr increased to 4mg /hr BP 120/88  HR 159 She is starting to look more comfortable.  She still has some increased work of breathing  RR 26 1734: 1 mg Dilaudid bolus from bag   Plan of Care:  Husband has gone home and will be back asap.   Will wean off bipap   Addendum: 1900 returned to check on patient.  Her husband is at her bedside.  Bipap was removed and Dilaudid gtt decreased to 2mg /hr.  She is comfortable and breathing shallow and regular.   Event Summary:   MD Notified: Dr Call Time: 1518 Arrival Time: 1522 End Time:  1735  , RN

## 2022-05-28 NOTE — Progress Notes (Addendum)
Received a call from bedside RN regarding the patient pronounced deceased on 06/18/2022 at 1920 p.m.  The patient was comfort care.  Per RN, her husband was at bedside at the time of death.

## 2022-05-28 NOTE — Progress Notes (Signed)
25 ml Dilaudid wasted in the steri cycle with RN Duffy Rhody.

## 2022-05-28 NOTE — Consult Note (Signed)
Consultation Note Date: 2022-06-02   Patient Name: Sharon Odom  DOB: [birth year]  MRN: [id number]  Age / Sex: 52 y.o., female  PCP: Oren Section, NP-C Referring Physician: Rodena Goldmann, DO  Reason for Consultation: Establishing goals of care  HPI/Patient Profile: 52 y.o. female  with past medical history of multiple sclerosis, neurogenic bladder, chronic indwelling Foley catheter, bilateral spasticity ( L > R ) with subsequent application of intrathecal pump for baclofen admitted on 05/08/2022 with fever from SNF.   Patient admitted for sepsis secondary to CAUTI POA and possible aspiration pneumonia.  Lactic acid increased from 1.9-2.4.  Also with AKI and CT angiography of chest showed dilated esophagus filled with fluid and ingested tablets. CT abdomen pelvis was suggestive of constipation/stercoral colitis. PMT has been consulted to assist with goals of care conversation.   Clinical Assessment and Goals of Care:  I have reviewed medical records including EPIC notes, labs and imaging, received report from RN, assessed the patient and then met with patient's husband Sharon Odom to discuss diagnosis prognosis, [place], EOL wishes, disposition and options.  I introduced Palliative Medicine as specialized medical care for people living with serious illness. It focuses on providing relief from the symptoms and stress of a serious illness. The goal is to improve quality of life for both the patient and the family.  We discussed a brief life review of the patient and then focused on their current illness.  The natural disease trajectory and expectations at EOL were discussed.  I attempted to elicit values and goals of care important to the patient.    Medical History Review and Understanding:  Patient's acute illness in the context of her chronic comorbidities was discussed in detail.  We discussed very poor prognosis with  likelihood the patient may not survive the night.  Patient's husband understood the severity of her illness and that she may not survive the night.  Social History: Sharon Odom and Sharon Odom have been married for 26 years.  We discussed patient's illness has been taking a toll on her quality of life and dignity for the past several years.  She used to enjoy putting herself together with make-up, hair care, getting her nails done.  [place] different [place].  Functional and Nutritional State: Patient is nonverbal and nonambulatory.  She never wanted to use a cane and then was hesitant about a walker/rollator and also had to grow accustomed to wheelchair.  Palliative Symptoms: Dyspnea, restlessness, fever, pain  Advance Directives: A detailed discussion regarding advanced directives was had.  Reviewed prior conversations with my colleagues during past hospitalizations, at which time patient wished for comfort focused care rather than aggressive interventions should she decline further.  Code Status: Concepts specific to code status, artifical feeding and hydration, and rehospitalization were considered and discussed.  DNR/DNI confirmed.  Discussion: Emotional support therapeutic listening was provided as patient's husband shared his understanding of her current suffering, he has never seen her this sick before.  He would not want his life to be prolonged in his current situations and feels like she would not either, sharing that "is not like it is COVID and she can come back from this."  Patient has not been able to return home since October 17 hospitalization and he has been researching progression of MS at end-of-life during this time, recognizing her decline.  He is having difficulty considering what hospice at home would look like as he does not feel prepared to take care of her.  I shared that patient is so severely ill she likely would not be stable for transport, possibly not even to  a hospice facility.  The option of a hospice facility was reviewed in the event that she is stable for this home-like environment.  He is worried that she will not live to see the new year and I share that this is my concern as well. I recommended consideration of comfort care counseling that patient would no longer receive aggressive medical interventions such as continuous vital signs, lab work, radiology testing, or medications not focused on comfort. All care would focus on how the patient is looking and feeling. This would include management of any symptoms that may cause discomfort, pain, shortness of breath, cough, nausea, agitation, anxiety, and/or secretions etc. Symptoms would be managed with medications and other non-pharmacological interventions such as spiritual support if requested, repositioning, music therapy, or therapeutic listening.   He shares his frustration that patient has not received much for her pain, while also acknowledging that nurses have to rely on providers to guide them in parameters for appropriate administration.  He states that the best pain control patient has had for the past few months is about 6 out of 10, with pain mostly being above 10 out of 10.  She is becoming increasingly more lethargic at SNF and he states "she is not herself anymore."  We also discussed her preference for regular diet feeding in detail.  She has been very clear about her preference for this due to pleasure, understanding the risk of aspiration events such as what likely precipitated the sepsis.  Reviewed diagnostics and imaging thus far, including immense distention of her colon and esophagus.  We discussed that even if she improves from this acute illness, there is no good long-term plan for nutritional support given she does not want a feeding tube.  He is leaning towards comfort care but having a difficult time voicing readiness.  He asked if he would still have the option to do this tomorrow and  we made plans for another goals of care discussion tomorrow at 2 PM to allow him more time to process this.   I then provided update to patient's attending, who informed me that patient is rapidly declining and now requiring Cardizem drip and BiPAP.  He is in favor of immediate transition to comfort care.  I called patient's husband to provide updates on this and he is now ready to transition to comfort care immediately.  Comfort care orders were placed at return to the bedside to discuss with rapid response nurse.    Discussed the importance of continued conversation with family and the medical providers regarding overall plan of care and treatment options, ensuring decisions are within the context of the patient's values and GOCs.   Questions and concerns were addressed.  Hard Choices booklet left for review. The family was encouraged to call with questions or concerns.  PMT will continue to support holistically.   SUMMARY OF RECOMMENDATIONS   -DNR confirmed -Transition to comfort focused care today Dilaudid ggt with PRN boluses Q52mn for pain/air hunger/comfort Robinul PRN for excessive secretions Ativan PRN for agitation/anxiety Zofran PRN for nausea Liquifilm tears PRN for dry eyes Unrestricted visitations in the setting of EOL (per policy) Wean BiPAP as tolerated while titrating opioids as needed for comfort -Psychosocial and emotional support provided -PMT will continue to follow and support  Prognosis:  Hours - Days  Discharge Planning: Anticipated Hospital Death  Primary Diagnoses: Present on Admission:  Sepsis secondary to UTI ([place])  Multiple sclerosis ([place])  Neurogenic bladder  Physical Exam Vitals and nursing note reviewed.  Constitutional:      General: She is in acute distress.     Appearance: She is ill-appearing.     Comments: BiPAP placed upon re-evaluation  Cardiovascular:     Rate and Rhythm: Tachycardia present.  Pulmonary:     Effort: Tachypnea,  accessory muscle usage and respiratory distress present.  Skin:    General: Skin is cool and dry.  Neurological:     Mental Status: She is lethargic.     Comments: Unresponsive upon re-evaluation    Vital Signs: BP 117/85   Pulse (!) 126   Temp 98.3 F (36.8 C) (Axillary)   Resp (!) 22   Wt 54 kg   SpO2 91%   BMI 17.58 kg/m  Pain Scale: PAINAD     SpO2: SpO2: 91 % O2 Device:SpO2: 91 % O2 Flow Rate: .    Palliative Assessment/Data: 10%      Total time: I spent 140 minutes in the care of the patient today in the above activities and documenting the encounter.  MDM: [place]   Ivi Griffith Johnnette Litter, PA-C  Palliative Medicine Team Team phone # [phone]  Thank you for allowing the Palliative Medicine Team to assist in the care of this patient. Please utilize secure chat with additional questions, if there is no response within 30 minutes please call the above phone number.  Palliative Medicine Team providers are available by phone from 7am to 7pm daily and can be reached through the team cell phone.  Should this patient require assistance outside of these hours, please call the patient's attending physician.

## 2022-05-28 NOTE — Progress Notes (Addendum)
Pharmacy Antibiotic Note  Sharon Odom is a 52 y.o. female admitted on 21-Jun-2022 with sepsis.  Pharmacy has been consulted for Unasyn and vancomycin dosing.  AKI improvement overnight, baseline Scr: ~0.7, current 0.95.   Plan: Adjust IV Vancomycin to  1000 every 24 hours for eAUC471. Using TBW as patient is below IBW. Vd: 0.72. 750mg  Q24H now subtherapeutic eAUC: 350.  Cont Unasyn 3G Q8H.  Follow culture data for de-escalation.  Monitor renal function for dose adjustments as indicated.   Weight: 54 kg (119 lb 0.8 oz)  Temp (24hrs), Avg:100.3 F (37.9 C), Min:98.3 F (36.8 C), Max:101.4 F (38.6 C)  Recent Labs  Lab 2022/06/21 1336 2022-06-21 1547 05/08/2022 0721  WBC 9.7  --  10.2  CREATININE 1.18*  --  0.95  LATICACIDVEN 1.4 1.9  --      Estimated Creatinine Clearance: 60.4 mL/min (by C-G formula based on SCr of 0.95 mg/dL).    Allergies  Allergen Reactions   Ziconotide Acetate Shortness Of Breath    Anorexia, weird sensations, could not talk, choking feeling, lost since of taste and smell. Hallucinations, vivid dreams. Confusion and sleepiness. N/v, increase in pain.  Anorexia, weird sensations, could not talk, choking feeling, lost since of taste and smell. Hallucinations, vivid dreams. Confusion and sleepiness. N/v, increase in pain.    Morphine Other (See Comments)    Pt does not recall reaction    Amoxicillin Nausea And Vomiting   Dantrolene Other (See Comments)    Muscle pain   Lamotrigine Other (See Comments)    Join pain   Lyrica [Pregabalin] Nausea And Vomiting   Thank you for allowing pharmacy to be a part of this patient's care.  05/29/22, PharmD, BCCCP  05/04/2022 10:40 AM

## 2022-05-28 NOTE — ED Notes (Signed)
Admitting provider at bedside made aware of increase in pt's HR

## 2022-05-28 NOTE — ED Notes (Signed)
Provider at bedside, new orders placed for EKG and fluids. Labs sent for analysis and patient remains stable at this time.

## 2022-05-28 DEATH — deceased

## 2022-05-30 LAB — LEGIONELLA PNEUMOPHILA SEROGP 1 UR AG: L. pneumophila Serogp 1 Ur Ag: NEGATIVE

## 2022-05-31 LAB — CULTURE, BLOOD (ROUTINE X 2)
Culture: NO GROWTH
Culture: NO GROWTH
Special Requests: ADEQUATE

## 2022-06-18 ENCOUNTER — Ambulatory Visit: Payer: BLUE CROSS/BLUE SHIELD | Admitting: Physical Medicine and Rehabilitation

## 2022-06-28 NOTE — Death Summary Note (Signed)
DEATH SUMMARY   Patient Details  Name: Sharon Odom MRN: [id number] DOB: 05/11/1971 VHQ:IONGE, [name], NP-C Admission/Discharge Information   Admit Date:  June 05, 2022  Date of Death: Date of Death: 06-06-22  Time of Death: Time of Death: 1920  Length of Stay: 1   Principle Cause of death: Sepsis due to CAUTI and aspiration pneumonia, POA  Hospital Diagnoses: Active Problems:   Dysphagia   Multiple sclerosis (HCC)   Neurogenic bladder   S/P insertion of intrathecal pump   Sepsis secondary to UTI (HCC)   AKI (acute kidney injury) (HCC)   Aspiration pneumonia (HCC)   Constipation   GERD (gastroesophageal reflux disease)   Decubitus ulcer of sacral area   Hospital Course:  Sharon Odom is a 52 y.o. female with medical history significant of multiple sclerosis, neurogenic bladder, chronic indwelling Foley catheter, bilateral spasticity ( L > R ) with subsequent application of intrathecal pump for baclofen who presents to the emergency department via EMS from [facility] due to fever.  Patient was unable to provide history due to being nonverbal at baseline.  Patient was admitted for sepsis secondary to CAUTI POA and possible aspiration pneumonia.  She is also noted to have some dehydration with AKI.  She was empirically been started on vancomycin and Unasyn based on prior urine cultures.  She continued to have ongoing sinus tachycardia despite aggressive IV fluid and lactic acid continued to rise.  GI evaluated her for dysphagia as well as fecal impaction with nothing much to offer given her condition. She continued to steadily decline despite aggressive IVF and pain management along with empiric antibiotics. Discussions were had with husband at bedside along with palliative care and patient appeared to be actively dying and suffering in the process. Decision was made to make her comfort care hours before her death at 109.   Assessment and Plan: No notes have been filed  under this hospital service. Service: Hospitalist        Procedures: None  Consultations: GI, Palliative  The results of significant diagnostics from this hospitalization (including imaging, microbiology, ancillary and laboratory) are listed below for reference.   Significant Diagnostic Studies: CT ABDOMEN PELVIS W CONTRAST  Result Date: 06/05/2022 CLINICAL DATA:  Concern for pulmonary embolus. Bowel obstruction suspected. EXAM: CT ANGIOGRAPHY CHEST CT ABDOMEN AND PELVIS WITH CONTRAST TECHNIQUE: Multidetector CT imaging of the chest was performed using the standard protocol during bolus administration of intravenous contrast. Multiplanar CT image reconstructions and MIPs were obtained to evaluate the vascular anatomy. Multidetector CT imaging of the abdomen and pelvis was performed using the standard protocol during bolus administration of intravenous contrast. RADIATION DOSE REDUCTION: This exam was performed according to the departmental dose-optimization program which includes automated exposure control, adjustment of the mA and/or kV according to patient size and/or use of iterative reconstruction technique. CONTRAST:  33mL OMNIPAQUE IOHEXOL 350 MG/ML SOLN COMPARISON:  Multiple priors including chest CT July 17, 2021 and CT abdomen August 24, 2021. FINDINGS: CTA CHEST FINDINGS Cardiovascular: Satisfactory opacification of the pulmonary arteries to the segmental level. No evidence of pulmonary embolism. Normal caliber thoracic aorta. Mild cardiac enlargement. No pericardial effusion. Mediastinum/Nodes: Dilated esophagus filled with fluid and ingested tablets. No pathologically enlarged mediastinal, hilar or axillary lymph nodes. The esophagus is grossly unremarkable. Lungs/Pleura: Diffuse interstitial opacities with interposed ground-glass opacities. Consolidations in the dependent bilateral lower lobes. Left lower lobe pneumatocele. No pleural effusion. No pneumothorax. Musculoskeletal: No  acute osseous abnormality. Review of the MIP images  confirms the above findings. CT ABDOMEN and PELVIS FINDINGS Hepatobiliary: No suspicious hepatic lesion. Gallbladder is distended without wall thickening. Prominence of the common bile duct measuring upper limits of normal at 6 mm. Pancreas: No pancreatic ductal dilation or evidence of acute inflammation. Spleen: No splenomegaly or focal splenic lesion. Adrenals/Urinary Tract: Bilateral adrenal glands appear normal. No hydronephrosis. Subcentimeter hypodense left renal lesion is technically too small to accurately characterize but statistically likely to reflect a cyst and considered benign requiring no independent imaging follow-up. Urinary bladder is decompressed around a Foley catheter. Stomach/Bowel: Stomach is minimally distended limiting evaluation. No pathologic dilation of small bowel. The appendix is not confidently identified in its entirety however there is no pericecal inflammation. Large volume of stool fills the colon with the cecum measuring 8.6 cm in diameter. Large volume of stool in the rectal vault with mild rectal wall thickening and adjacent stranding. Vascular/Lymphatic: Aortic atherosclerosis. Normal caliber abdominal aorta. The portal, splenic and superior mesenteric veins are patent. IVC is slightly compressed. No pathologic dilation of small or large bowel. Reproductive: No acute abnormality. Other: No significant abdominopelvic free fluid. Musculoskeletal: Multilevel degenerative changes spine. No acute osseous abnormality. Review of the MIP images confirms the above findings. IMPRESSION: CTA CHEST: 1. No evidence of pulmonary embolism. 2. Diffuse interstitial opacities with interposed ground-glass opacities, may reflect pulmonary edema or atypical infection. 3. Consolidations in the dependent bilateral lower lobes, suspicious for aspiration versus atelectasis. 4. Dilated esophagus filled with fluid and ingested tablets. CT  ABDOMEN/PELVIS: 1. Very large volume of stool throughout dilated colon with stool in the rectal vault and mild rectal wall thickening with trace adjacent stranding. Findings are compatible with severe constipation and possible stercoral colitis. 2. Gallbladder is distended without wall thickening. Prominence of the common bile duct measuring upper limits of normal at 6 mm. Recommend correlation with LFTs. 3.  Aortic Atherosclerosis (ICD10-I70.0). Electronically Signed   By: Maudry Mayhew M.D.   On: 06/21/2022 17:55   CT Angio Chest PE W and/or Wo Contrast  Result Date: 06-21-22 CLINICAL DATA:  Concern for pulmonary embolus. Bowel obstruction suspected. EXAM: CT ANGIOGRAPHY CHEST CT ABDOMEN AND PELVIS WITH CONTRAST TECHNIQUE: Multidetector CT imaging of the chest was performed using the standard protocol during bolus administration of intravenous contrast. Multiplanar CT image reconstructions and MIPs were obtained to evaluate the vascular anatomy. Multidetector CT imaging of the abdomen and pelvis was performed using the standard protocol during bolus administration of intravenous contrast. RADIATION DOSE REDUCTION: This exam was performed according to the departmental dose-optimization program which includes automated exposure control, adjustment of the mA and/or kV according to patient size and/or use of iterative reconstruction technique. CONTRAST:  59mL OMNIPAQUE IOHEXOL 350 MG/ML SOLN COMPARISON:  Multiple priors including chest CT July 17, 2021 and CT abdomen August 24, 2021. FINDINGS: CTA CHEST FINDINGS Cardiovascular: Satisfactory opacification of the pulmonary arteries to the segmental level. No evidence of pulmonary embolism. Normal caliber thoracic aorta. Mild cardiac enlargement. No pericardial effusion. Mediastinum/Nodes: Dilated esophagus filled with fluid and ingested tablets. No pathologically enlarged mediastinal, hilar or axillary lymph nodes. The esophagus is grossly unremarkable.  Lungs/Pleura: Diffuse interstitial opacities with interposed ground-glass opacities. Consolidations in the dependent bilateral lower lobes. Left lower lobe pneumatocele. No pleural effusion. No pneumothorax. Musculoskeletal: No acute osseous abnormality. Review of the MIP images confirms the above findings. CT ABDOMEN and PELVIS FINDINGS Hepatobiliary: No suspicious hepatic lesion. Gallbladder is distended without wall thickening. Prominence of the common bile duct measuring upper limits of normal  at 6 mm. Pancreas: No pancreatic ductal dilation or evidence of acute inflammation. Spleen: No splenomegaly or focal splenic lesion. Adrenals/Urinary Tract: Bilateral adrenal glands appear normal. No hydronephrosis. Subcentimeter hypodense left renal lesion is technically too small to accurately characterize but statistically likely to reflect a cyst and considered benign requiring no independent imaging follow-up. Urinary bladder is decompressed around a Foley catheter. Stomach/Bowel: Stomach is minimally distended limiting evaluation. No pathologic dilation of small bowel. The appendix is not confidently identified in its entirety however there is no pericecal inflammation. Large volume of stool fills the colon with the cecum measuring 8.6 cm in diameter. Large volume of stool in the rectal vault with mild rectal wall thickening and adjacent stranding. Vascular/Lymphatic: Aortic atherosclerosis. Normal caliber abdominal aorta. The portal, splenic and superior mesenteric veins are patent. IVC is slightly compressed. No pathologic dilation of small or large bowel. Reproductive: No acute abnormality. Other: No significant abdominopelvic free fluid. Musculoskeletal: Multilevel degenerative changes spine. No acute osseous abnormality. Review of the MIP images confirms the above findings. IMPRESSION: CTA CHEST: 1. No evidence of pulmonary embolism. 2. Diffuse interstitial opacities with interposed ground-glass opacities, may  reflect pulmonary edema or atypical infection. 3. Consolidations in the dependent bilateral lower lobes, suspicious for aspiration versus atelectasis. 4. Dilated esophagus filled with fluid and ingested tablets. CT ABDOMEN/PELVIS: 1. Very large volume of stool throughout dilated colon with stool in the rectal vault and mild rectal wall thickening with trace adjacent stranding. Findings are compatible with severe constipation and possible stercoral colitis. 2. Gallbladder is distended without wall thickening. Prominence of the common bile duct measuring upper limits of normal at 6 mm. Recommend correlation with LFTs. 3.  Aortic Atherosclerosis (ICD10-I70.0). Electronically Signed   By: Maudry Mayhew M.D.   On: 04/27/2022 17:55   DG Abd Portable 1 View  Result Date: 05/12/2022 CLINICAL DATA:  Possible sepsis. EXAM: PORTABLE ABDOMEN - 1 VIEW COMPARISON:  03/22/2022 FINDINGS: Again demonstrated is a large amount of stool throughout the:. A right abdominal battery pack is again demonstrated. No acute bony abnormality. IMPRESSION: Large stool burden. Electronically Signed   By: Beckie Salts M.D.   On: 05/23/2022 14:52   DG Chest Portable 1 View  Result Date: 05/15/2022 CLINICAL DATA:  Sepsis. EXAM: PORTABLE CHEST 1 VIEW COMPARISON:  Abdomen radiographs obtained at the same time. Chest radiographs dated 03/24/2022. Chest CT dated 07/17/2021. FINDINGS: The cardiac silhouette remains at the upper limit of normal in size, accentuated by poor inspiration and mild elevation of the right hemidiaphragm. Minimal right basilar atelectasis with improvement. Otherwise, clear lungs. Diffuse osteopenia. IMPRESSION: Minimal right basilar atelectasis with improvement. Electronically Signed   By: Beckie Salts M.D.   On: 05/03/2022 14:47    Microbiology: Recent Results (from the past 240 hour(s))  Culture, blood (Routine x 2)     Status: None (Preliminary result)   Collection Time: 05/25/2022  1:35 PM   Specimen: BLOOD   Result Value Ref Range Status   Specimen Description BLOOD SITE NOT SPECIFIED  Final   Special Requests   Final    BOTTLES DRAWN AEROBIC AND ANAEROBIC Blood Culture results may not be optimal due to an inadequate volume of blood received in culture bottles   Culture   Final    NO GROWTH 3 DAYS Performed at [facility], [address] [place], [place] [address]    Report Status PENDING  Incomplete  Culture, blood (Routine x 2)     Status: None (Preliminary result)  Collection Time: 2022-06-19  1:35 PM   Specimen: BLOOD  Result Value Ref Range Status   Specimen Description BLOOD SITE NOT SPECIFIED  Final   Special Requests   Final    BOTTLES DRAWN AEROBIC AND ANAEROBIC Blood Culture adequate volume   Culture   Final    NO GROWTH 3 DAYS Performed at [facility], [address], [place], [place] [address]    Report Status PENDING  Incomplete  Resp panel by RT-PCR (RSV, Flu A&B, Covid) Anterior Nasal Swab     Status: None   Collection Time: 06-19-22  1:44 PM   Specimen: Anterior Nasal Swab  Result Value Ref Range Status   SARS Coronavirus 2 by RT PCR NEGATIVE NEGATIVE Final    Comment: (NOTE) SARS-CoV-2 target nucleic acids are NOT DETECTED.  The SARS-CoV-2 RNA is generally detectable in upper respiratory specimens during the acute phase of infection. The lowest concentration of SARS-CoV-2 viral copies this assay can detect is 138 copies/mL. A negative result does not preclude SARS-Cov-2 infection and should not be used as the sole basis for treatment or other patient management decisions. A negative result may occur with  improper specimen collection/handling, submission of specimen other than nasopharyngeal swab, presence of viral mutation(s) within the areas targeted by this assay, and inadequate number of viral copies(<138 copies/mL). A negative result must be combined with clinical observations, patient history, and epidemiological information. The expected  result is Negative.  Fact Sheet for Patients:  EntrepreneurPulse.com.au  Fact Sheet for Healthcare Providers:  IncredibleEmployment.be  This test is no t yet approved or cleared by the [place] FDA and  has been authorized for detection and/or diagnosis of SARS-CoV-2 by FDA under an Emergency Use Authorization (EUA). This EUA will remain  in effect (meaning this test can be used) for the duration of the COVID-19 declaration under Section 564(b)(1) of the Act, 21 U.S.C.section 360bbb-3(b)(1), unless the authorization is terminated  or revoked sooner.       Influenza A by PCR NEGATIVE NEGATIVE Final   Influenza B by PCR NEGATIVE NEGATIVE Final    Comment: (NOTE) The Xpert Xpress SARS-CoV-2/FLU/RSV plus assay is intended as an aid in the diagnosis of influenza from Nasopharyngeal swab specimens and should not be used as a sole basis for treatment. Nasal washings and aspirates are unacceptable for Xpert Xpress SARS-CoV-2/FLU/RSV testing.  Fact Sheet for Patients: EntrepreneurPulse.com.au  Fact Sheet for Healthcare Providers: IncredibleEmployment.be  This test is not yet approved or cleared by the [place] FDA and has been authorized for detection and/or diagnosis of SARS-CoV-2 by FDA under an Emergency Use Authorization (EUA). This EUA will remain in effect (meaning this test can be used) for the duration of the COVID-19 declaration under Section 564(b)(1) of the Act, 21 U.S.C. section 360bbb-3(b)(1), unless the authorization is terminated or revoked.     Resp Syncytial Virus by PCR NEGATIVE NEGATIVE Final    Comment: (NOTE) Fact Sheet for Patients: EntrepreneurPulse.com.au  Fact Sheet for Healthcare Providers: IncredibleEmployment.be  This test is not yet approved or cleared by the [place] FDA and has been authorized for detection and/or diagnosis of  SARS-CoV-2 by FDA under an Emergency Use Authorization (EUA). This EUA will remain in effect (meaning this test can be used) for the duration of the COVID-19 declaration under Section 564(b)(1) of the Act, 21 U.S.C. section 360bbb-3(b)(1), unless the authorization is terminated or revoked.  Performed at [facility], [place] [address] [place], [place] [address]  Urine Culture     Status: None   Collection Time: 06-02-22  7:43 AM   Specimen: In/Out Cath Urine  Result Value Ref Range Status   Specimen Description IN/OUT CATH URINE  Final   Special Requests NONE  Final   Culture   Final    NO GROWTH Performed at [facility], [place] [address], [place], [place] [address]    Report Status 05/28/2022 FINAL  Final    Time spent: 35 minutes  Signed: Rodena Goldmann, DO 06/02/22

## 2023-10-01 IMAGING — CT CT CHEST W/O CM
2 of 4 series · 15 of 36 positions shown, 18 images · non-contrast
Comparison: Thoracic spine MRI 06/18/2021.

CLINICAL DATA: 50-year-old female with multiple sclerosis,
increasing muscle spasm, transient hypotension. Left lung base
abnormality on thoracic spine MRI yesterday, query pneumonia.



[Series 3: chest wo · axial · 0.62mm/px · z∈[+1241,+1463]mm · 12 of 133 slices shown, 15 images]
[im 11/133  mediastinal]
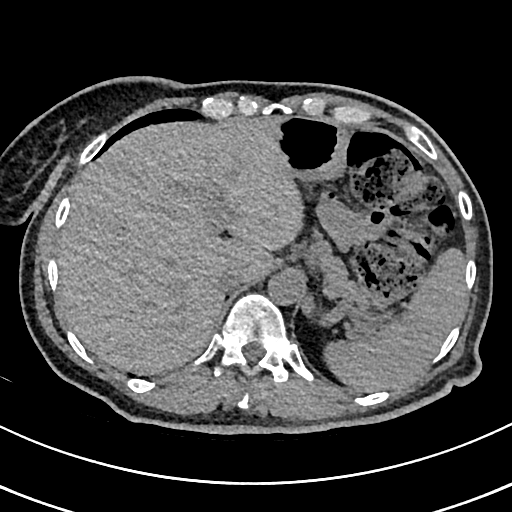
[im 11/133  lung]
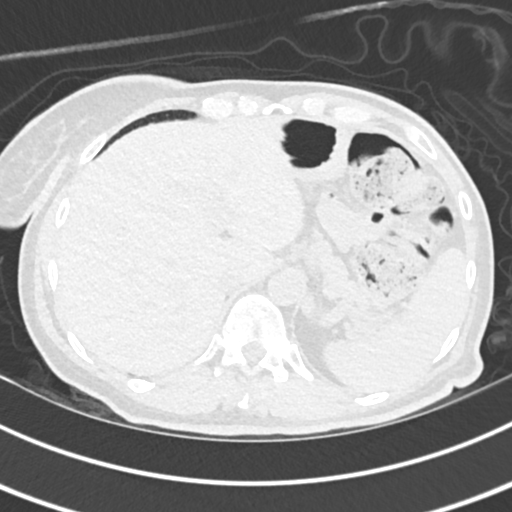
[im 21/133  lung]
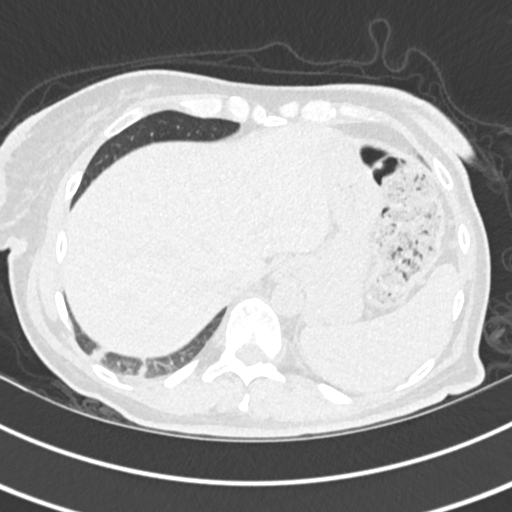
[im 31/133  lung]
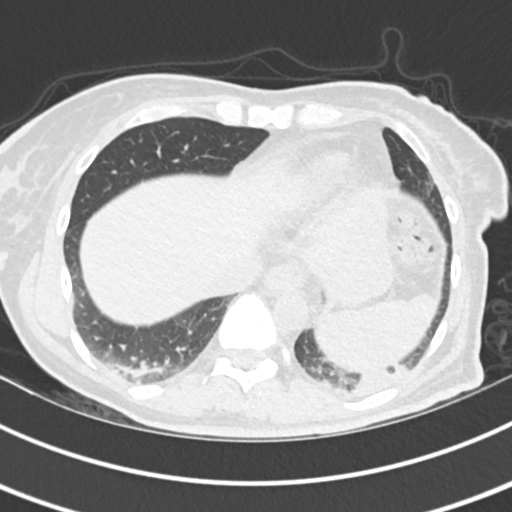
[im 41/133  lung]
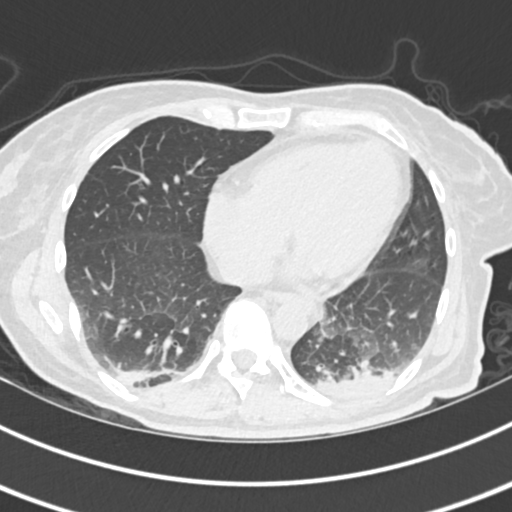
[im 51/133  mediastinal]
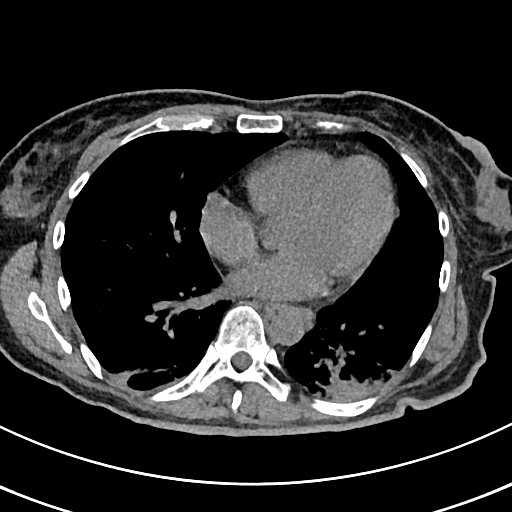
[im 51/133  lung]
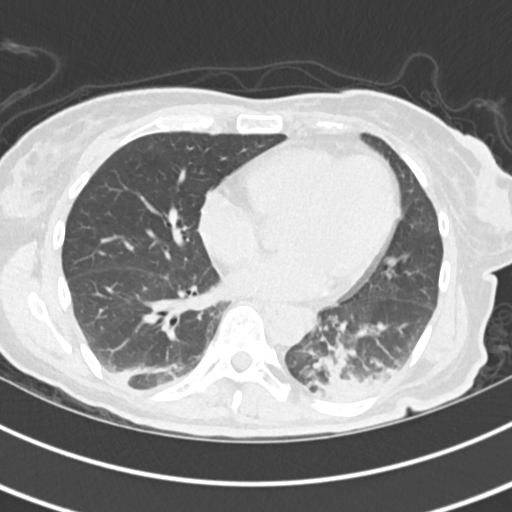
[im 61/133  lung]
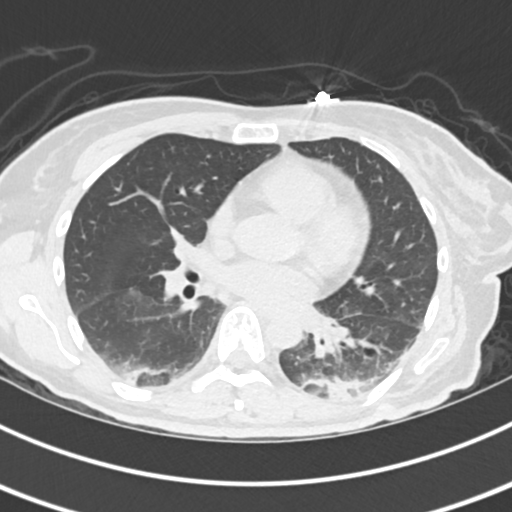
[im 72/133  lung]
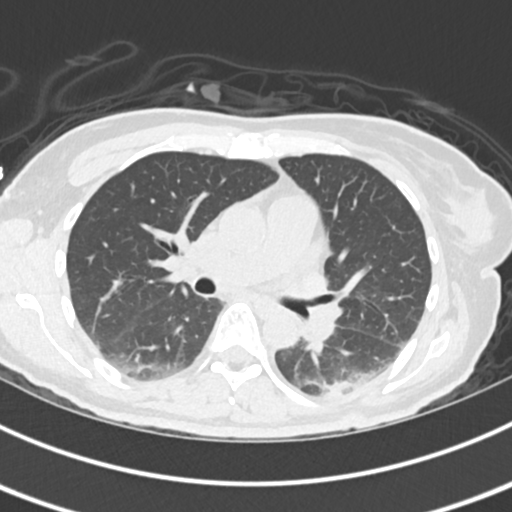
[im 82/133  lung]
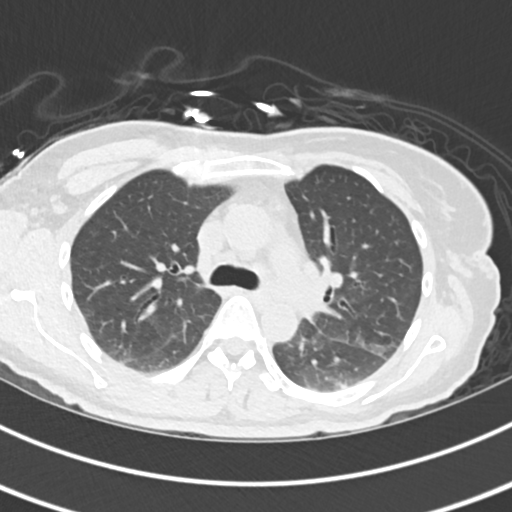
[im 92/133  mediastinal]
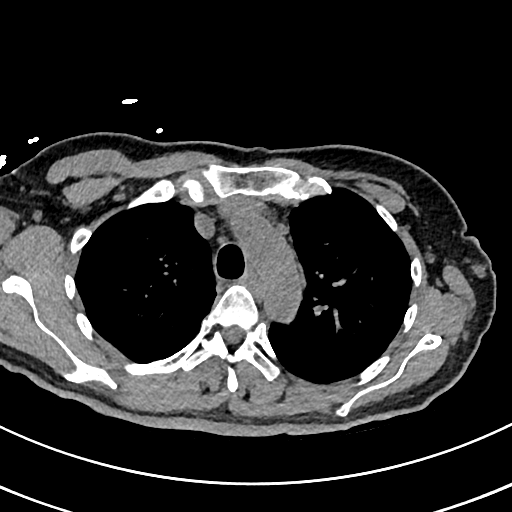
[im 92/133  lung]
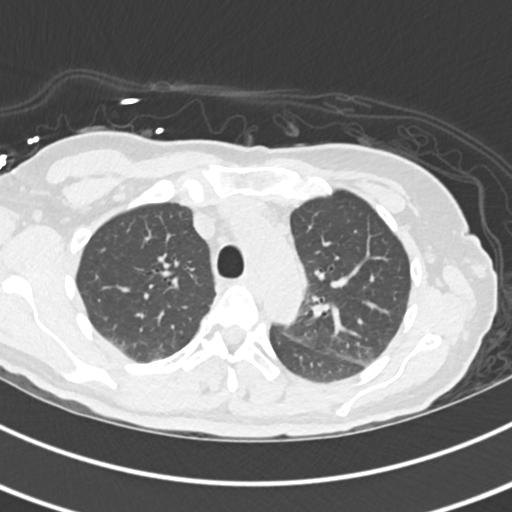
[im 102/133  lung]
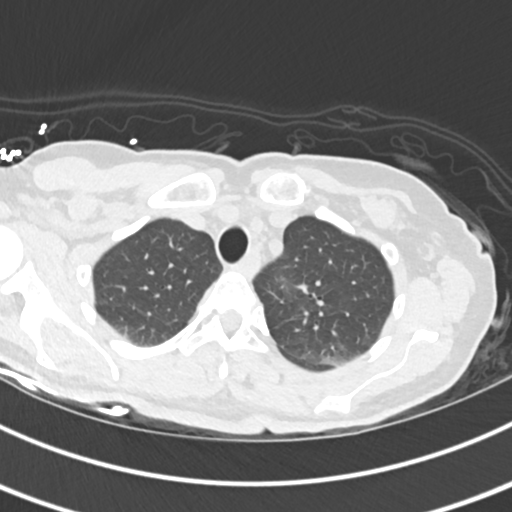
[im 112/133  lung]
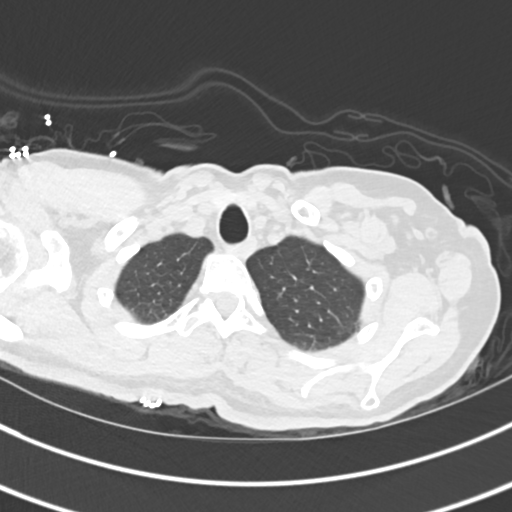
[im 122/133  lung]
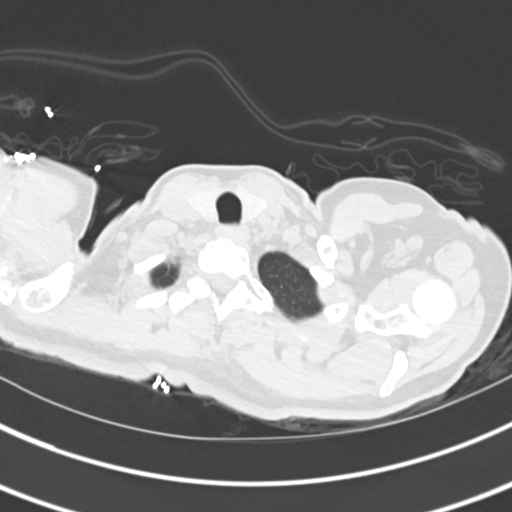

[Series 6: cor · coronal · 0.55mm/px · 3 of 117 slices shown]
[im 24/117  lung]
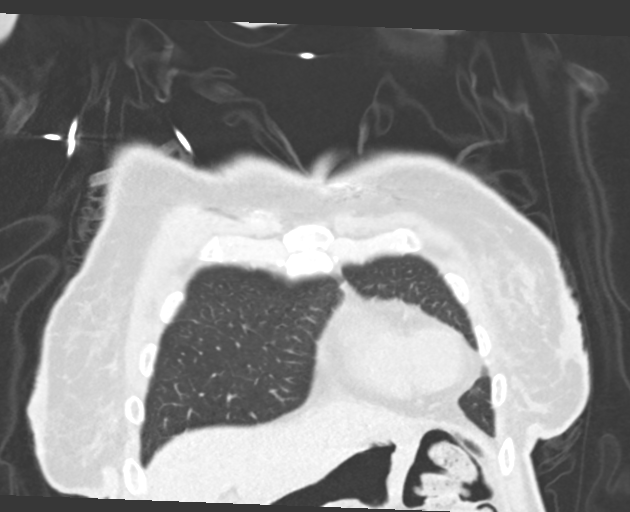
[im 47/117  lung]
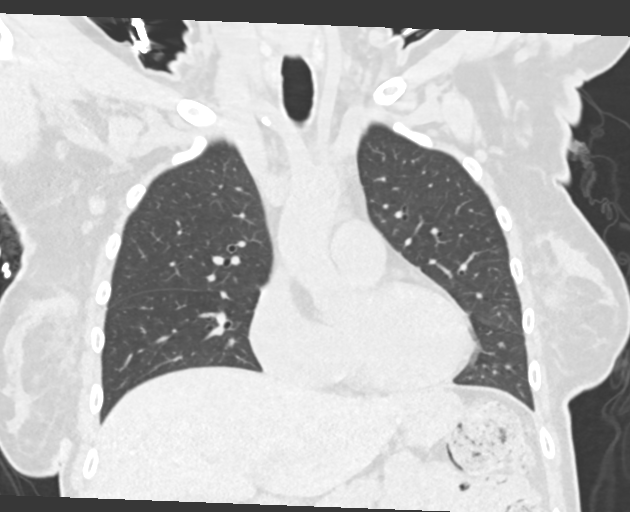
[im 70/117  lung]
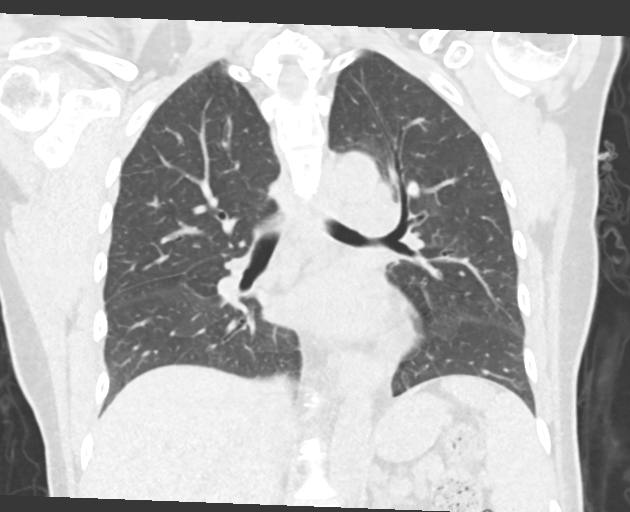

[15 of 36 positions shown; findings below may reference images not displayed]

FINDINGS: Cardiovascular: No cardiomegaly or pericardial effusion. Negative
noncontrast thoracic aorta.

Mediastinum/Nodes: Small, reactive appearing mediastinal lymph
nodes.

Lungs/Pleura: Major airways are patent through the mainstem bronchi,
but the left lower lobe bronchus is segmentally occluded on series
6, image 79. There is confluent opacity in the left lower lobe,
partially dependent and peribronchial. Less pronounced deep and
opacity in the right lower lobe, with no airway opacification on
that side. No pleural effusion. Additional mild dependent
atelectasis along both major fissures.

Upper Abdomen: Negative visible noncontrast liver, gallbladder,
spleen, pancreas, left adrenal gland, and bowel in the upper
abdomen.

Musculoskeletal: No acute or suspicious osseous lesion.
IMPRESSION: 1. Segmental occlusion of the left lower lobe bronchus. This could
reflect airway infection or aspiration, but endobronchial mass is
difficult to exclude. And follow-up bronchoscopy should be
considered if the left lung abnormality fails to resolve (see #2).

2. Associated confluent left lower lobe opacity is probably a
combination of pneumonia and atelectasis. No pleural effusion.
Superimposed atelectasis elsewhere.

3. Reactive appearing mediastinal lymph nodes.

## 2023-10-01 IMAGING — DX DG CHEST 1V PORT
1 series · 1 of 1 positions shown · non-contrast
Comparison: 06/08/2021.

CLINICAL DATA: Opacity seen on MRI concerning for pneumonia.

EXAM:
PORTABLE CHEST 1 VIEW

[chest]
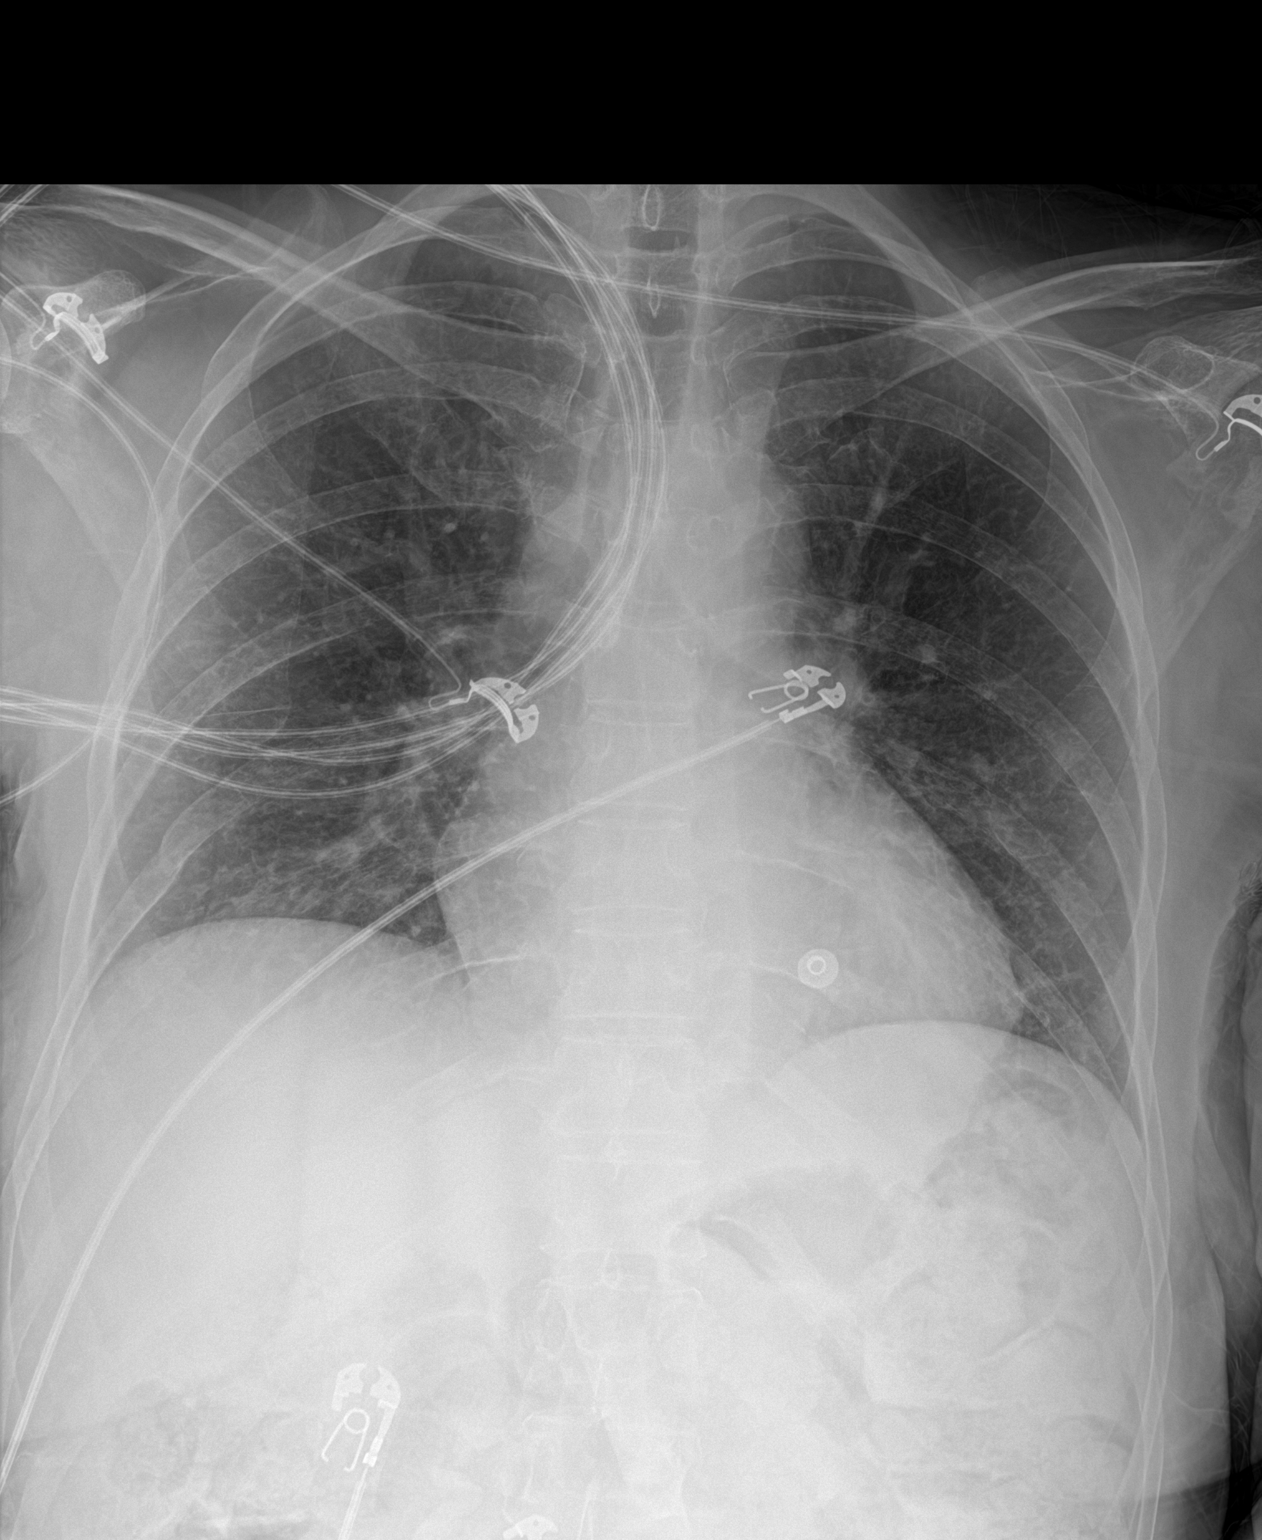

[1 of 1 positions shown; findings below may reference images not displayed]

FINDINGS: The heart size and mediastinal contours are within normal limits.
Mild atelectasis is present at the lung bases. No consolidation,
effusion, or pneumothorax. No acute osseous abnormality.
IMPRESSION: Mild atelectasis at the lung bases.  No acute abnormality.

## 2023-10-30 IMAGING — CT CT CHEST W/O CM
2 of 3 series · 15 of 36 positions shown, 18 images · non-contrast
Comparison: 06/18/2021

CLINICAL DATA: Left-sided weakness, evaluate for aspiration



[Series 2: thorax · axial · 0.61mm/px · z∈[-252,-22]mm · 12 of 137 slices shown, 15 images]
[im 11/137  mediastinal]
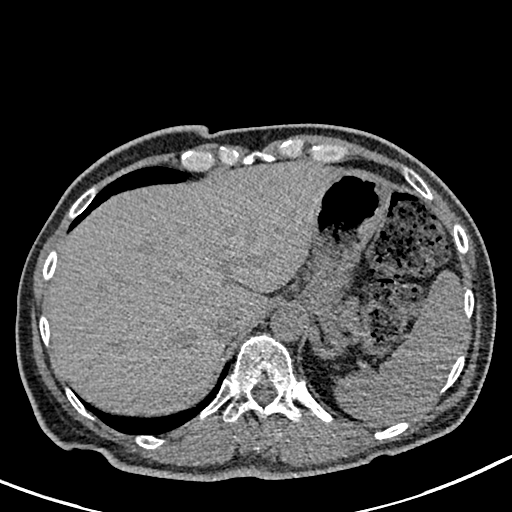
[im 11/137  lung]
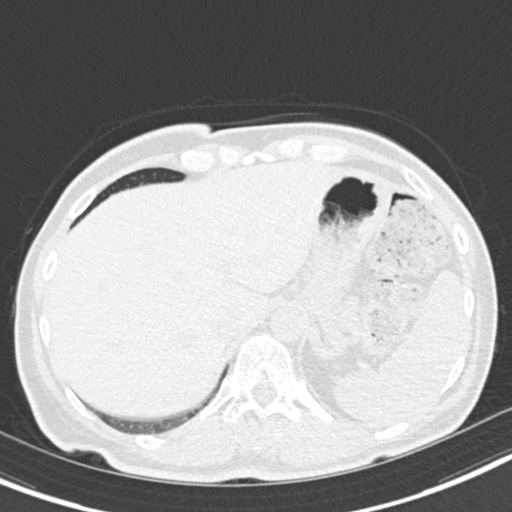
[im 21/137  lung]
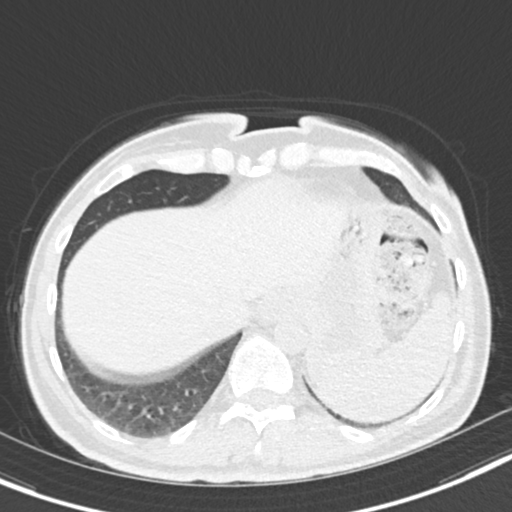
[im 31/137  lung]
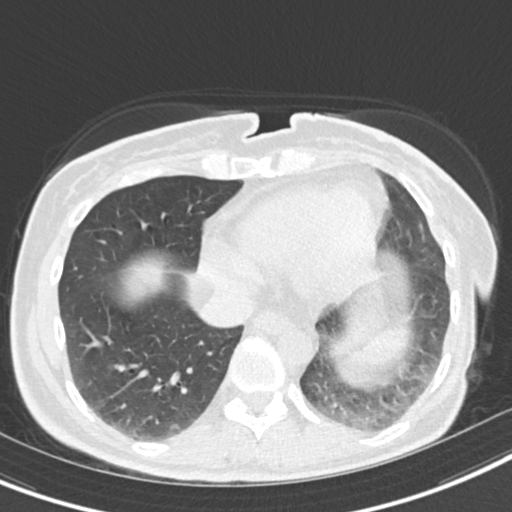
[im 41/137  lung]
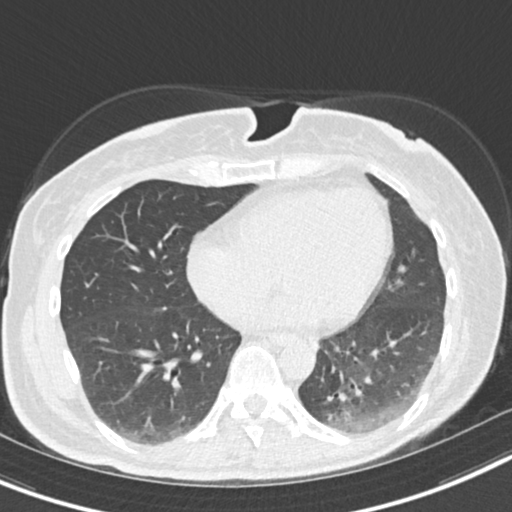
[im 51/137  mediastinal]
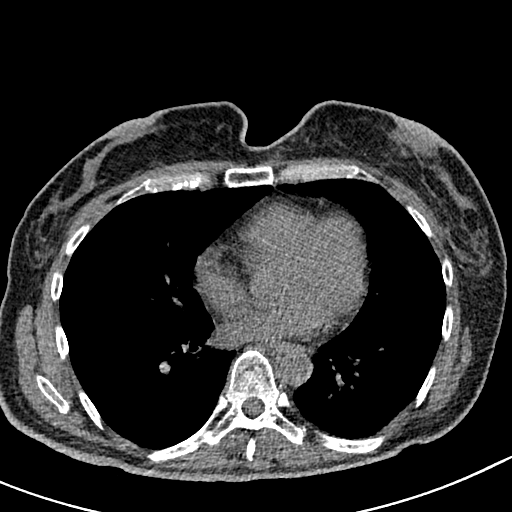
[im 51/137  lung]
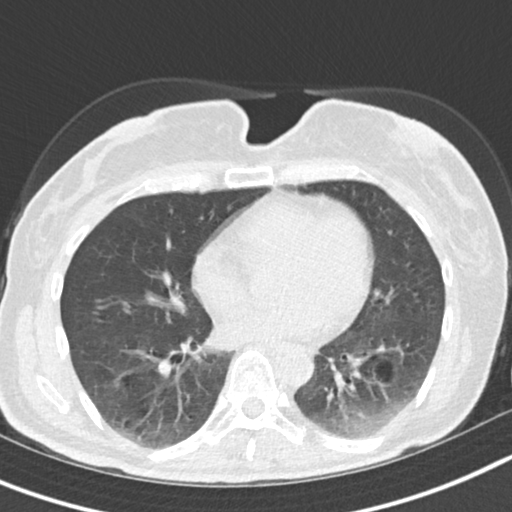
[im 61/137  lung]
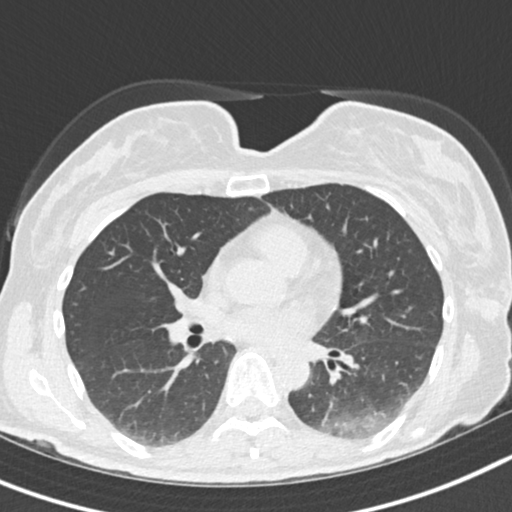
[im 76/137  lung]
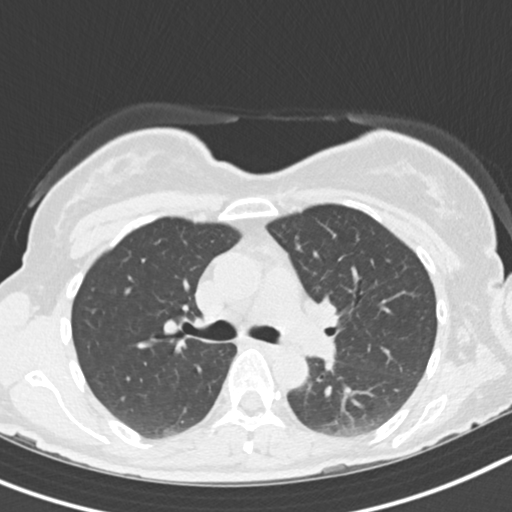
[im 86/137  lung]
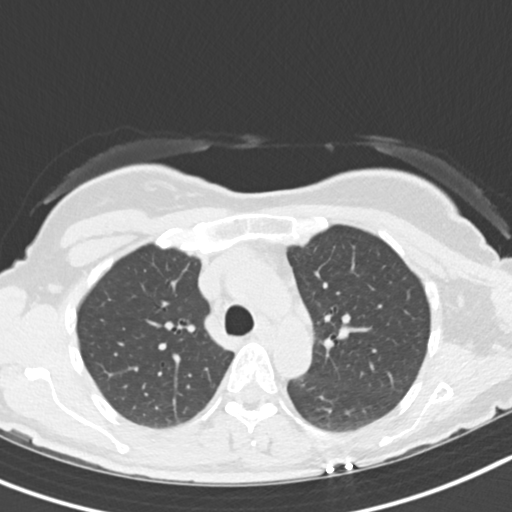
[im 96/137  mediastinal]
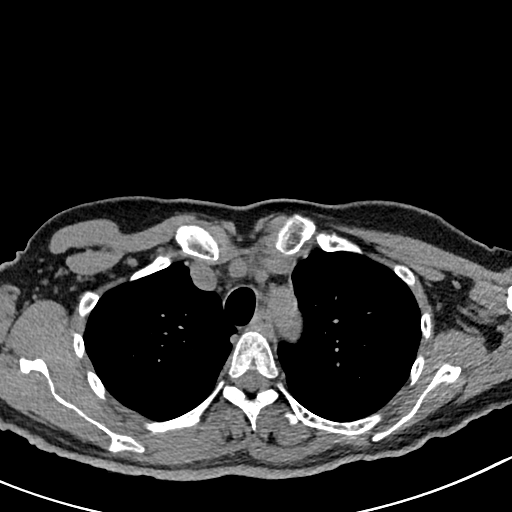
[im 96/137  lung]
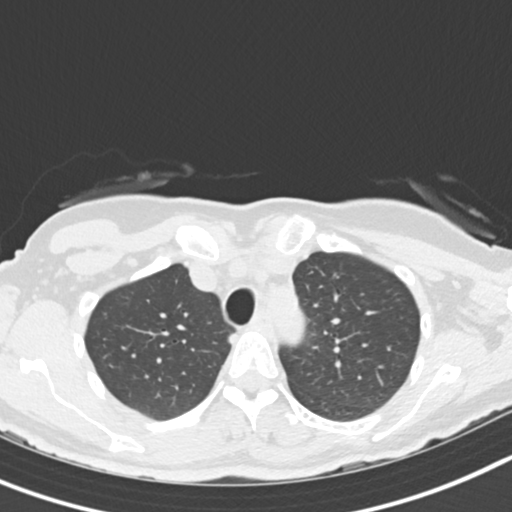
[im 106/137  lung]
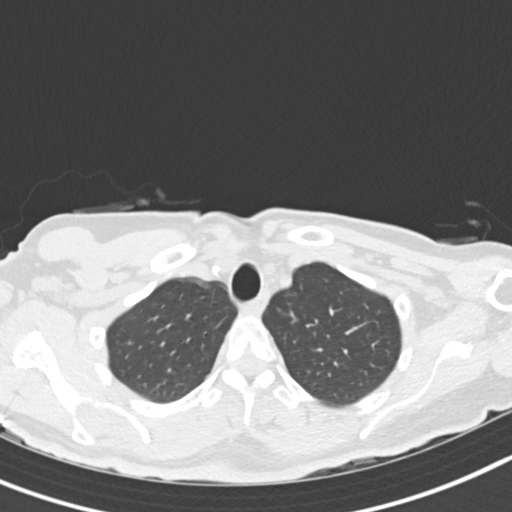
[im 116/137  lung]
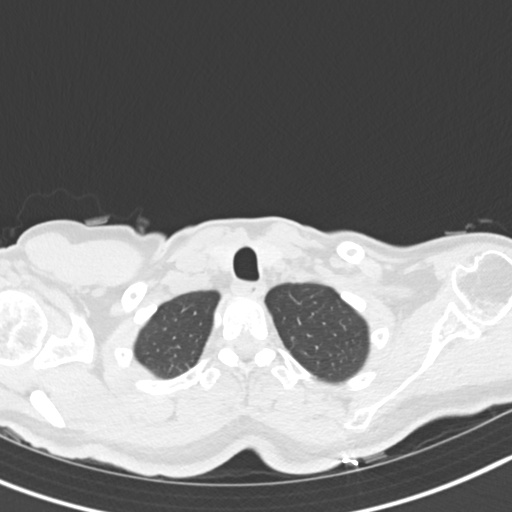
[im 126/137  lung]
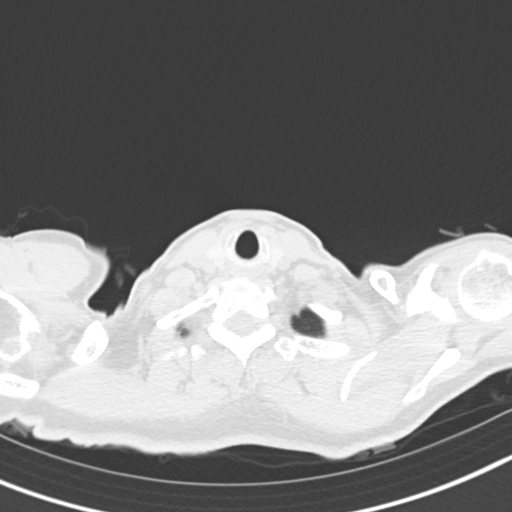

[Series 5: coronal · coronal · 0.55mm/px · 3 of 119 slices shown]
[im 24/119  lung]
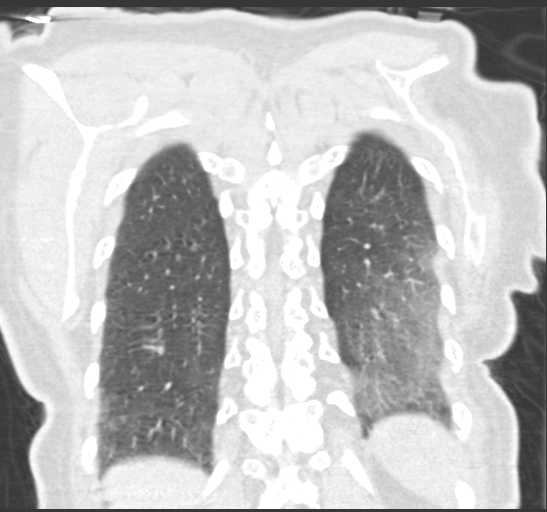
[im 48/119  lung]
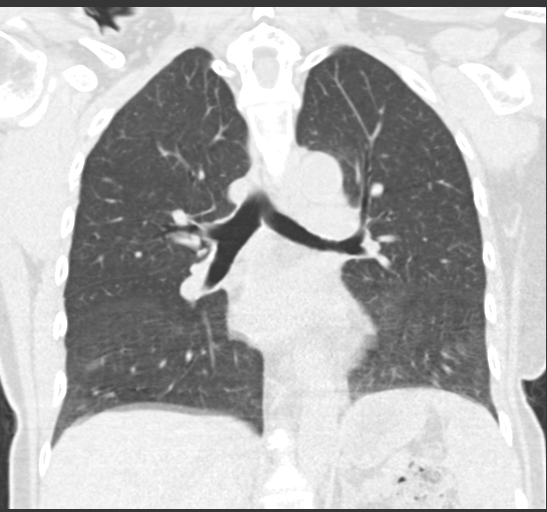
[im 71/119  lung]
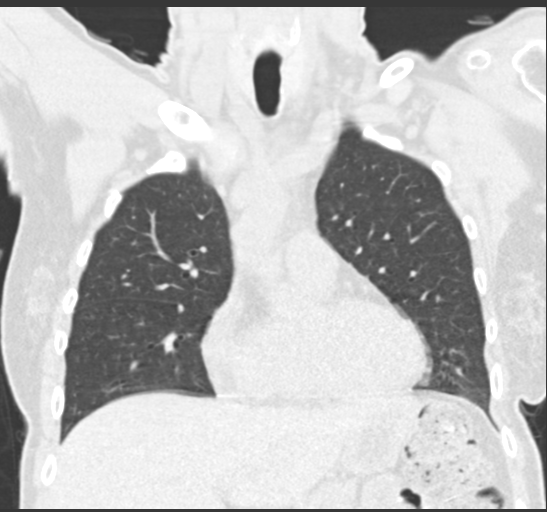

[15 of 36 positions shown; findings below may reference images not displayed]

FINDINGS: Cardiovascular: Heart is enlarged in size.

Mediastinum/Nodes: There are slightly enlarged lymph nodes in the
mediastinum with no significant change. There are slightly enlarged
lymph nodes in both axillary regions with no significant change.

Lungs/Pleura: There is interval improvement in aeration of segmental
bronchi in the left lower lobe. Fluid density seen within left lower
lobe segmental bronchi in the previous examination of 06/18/2021 has
resolved. There is 2.1 cm bulla in the left lower lobe. There is
interval clearing of patchy infiltrates in the both lower lung
fields suggesting resolution of atelectasis/pneumonia. There is
faint diffuse ground-glass density in the posterior left lower lung
fields suggesting scarring or interstitial pneumonia. There are no
new focal pulmonary infiltrates. There is no pleural effusion or
pneumothorax.

Upper Abdomen: Small hiatal hernia is seen.

Musculoskeletal: Unremarkable
IMPRESSION: There is interval clearing of intrabronchial soft tissue densities
in the left lower lobe suggesting clearing of mucous secretions.
There is interval clearing of patchy alveolar infiltrates in both
lower lobes suggesting resolving atelectasis/pneumonia.

Faint residual ground-glass densities seen in the posterior lower
lung fields, more so on the left side suggesting residual scarring
or interstitial pneumonia. No new focal pulmonary consolidation is
seen. There is no pleural effusion or pneumothorax.

Small hiatal hernia.
